# Patient Record
Sex: Female | Born: 1950 | Race: White | Hispanic: No | Marital: Married | Smoking: Former smoker
Health system: Southern US, Academic
[De-identification: ages and names within clinical notes are randomized; demographics above are authoritative.]

## PROBLEM LIST (undated history)

## (undated) DIAGNOSIS — M545 Low back pain, unspecified: Secondary | ICD-10-CM

## (undated) DIAGNOSIS — J45909 Unspecified asthma, uncomplicated: Secondary | ICD-10-CM

## (undated) DIAGNOSIS — I4891 Unspecified atrial fibrillation: Secondary | ICD-10-CM

## (undated) DIAGNOSIS — Z9289 Personal history of other medical treatment: Secondary | ICD-10-CM

## (undated) DIAGNOSIS — C189 Malignant neoplasm of colon, unspecified: Secondary | ICD-10-CM

## (undated) DIAGNOSIS — L02211 Cutaneous abscess of abdominal wall: Secondary | ICD-10-CM

## (undated) DIAGNOSIS — M1990 Unspecified osteoarthritis, unspecified site: Secondary | ICD-10-CM

## (undated) DIAGNOSIS — G4733 Obstructive sleep apnea (adult) (pediatric): Secondary | ICD-10-CM

## (undated) DIAGNOSIS — Z9884 Bariatric surgery status: Secondary | ICD-10-CM

## (undated) DIAGNOSIS — I10 Essential (primary) hypertension: Secondary | ICD-10-CM

## (undated) DIAGNOSIS — C539 Malignant neoplasm of cervix uteri, unspecified: Secondary | ICD-10-CM

## (undated) DIAGNOSIS — E785 Hyperlipidemia, unspecified: Secondary | ICD-10-CM

## (undated) DIAGNOSIS — K219 Gastro-esophageal reflux disease without esophagitis: Secondary | ICD-10-CM

## (undated) DIAGNOSIS — G8929 Other chronic pain: Secondary | ICD-10-CM

## (undated) DIAGNOSIS — J189 Pneumonia, unspecified organism: Secondary | ICD-10-CM

## (undated) DIAGNOSIS — G4700 Insomnia, unspecified: Secondary | ICD-10-CM

## (undated) DIAGNOSIS — Z9981 Dependence on supplemental oxygen: Secondary | ICD-10-CM

## (undated) DIAGNOSIS — L988 Other specified disorders of the skin and subcutaneous tissue: Secondary | ICD-10-CM

## (undated) DIAGNOSIS — J449 Chronic obstructive pulmonary disease, unspecified: Secondary | ICD-10-CM

## (undated) DIAGNOSIS — E669 Obesity, unspecified: Secondary | ICD-10-CM

## (undated) DIAGNOSIS — H269 Unspecified cataract: Secondary | ICD-10-CM

## (undated) DIAGNOSIS — M129 Arthropathy, unspecified: Secondary | ICD-10-CM

## (undated) DIAGNOSIS — C801 Malignant (primary) neoplasm, unspecified: Secondary | ICD-10-CM

## (undated) DIAGNOSIS — R918 Other nonspecific abnormal finding of lung field: Secondary | ICD-10-CM

## (undated) DIAGNOSIS — G4730 Sleep apnea, unspecified: Secondary | ICD-10-CM

## (undated) DIAGNOSIS — K439 Ventral hernia without obstruction or gangrene: Secondary | ICD-10-CM

## (undated) HISTORY — PX: HERNIA REPAIR: SHX51

## (undated) HISTORY — DX: Unspecified atrial fibrillation: I48.91

## (undated) HISTORY — PX: LAPAROSCOPIC GASTRIC BANDING: SHX1100

## (undated) HISTORY — DX: Morbid (severe) obesity due to excess calories: E66.01

## (undated) HISTORY — PX: ABDOMINAL HERNIA REPAIR: SHX539

## (undated) HISTORY — PX: DILATION AND CURETTAGE OF UTERUS: SHX78

## (undated) HISTORY — PX: COLON SURGERY: SHX602

## (undated) HISTORY — DX: Other specified disorders of the skin and subcutaneous tissue: L98.8

## (undated) HISTORY — DX: Chronic obstructive pulmonary disease, unspecified: J44.9

## (undated) HISTORY — DX: Ventral hernia without obstruction or gangrene: K43.9

## (undated) HISTORY — PX: ABSCESS DRAINAGE: SHX399

## (undated) HISTORY — PX: HX HYSTERECTOMY: SHX81

## (undated) HISTORY — PX: HX LAPAROTOMY: SHX154

## (undated) HISTORY — PX: CATARACT EXTRACTION: SUR2

## (undated) HISTORY — PX: HX GASTRIC BYPASS: SHX52

## (undated) HISTORY — PX: HX HERNIA REPAIR: SHX51

## (undated) HISTORY — PX: HX CERVICAL CONE BIOPSY: 2100001333

## (undated) HISTORY — DX: Chronic obstructive pulmonary disease, unspecified (CMS HCC): J44.9

## (undated) HISTORY — PX: BOWEL RESECTION: SHX1257

## (undated) HISTORY — PX: HX LAP BANDING: 2100001104

---
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
## 1970-08-20 DIAGNOSIS — C539 Malignant neoplasm of cervix uteri, unspecified: Secondary | ICD-10-CM

## 1970-08-20 HISTORY — DX: Malignant neoplasm of cervix uteri, unspecified: C53.9

## 1990-08-20 HISTORY — PX: GASTRIC BYPASS: SHX52

## 1996-08-20 HISTORY — PX: TOTAL ABDOMINAL HYSTERECTOMY: SHX209

## 2006-09-13 ENCOUNTER — Other Ambulatory Visit: Payer: Self-pay

## 2008-04-27 ENCOUNTER — Other Ambulatory Visit (HOSPITAL_COMMUNITY): Payer: Self-pay | Admitting: Family Medicine

## 2008-06-10 ENCOUNTER — Ambulatory Visit
Admission: RE | Admit: 2008-06-10 | Discharge: 2008-06-10 | Disposition: A | Discharge status: Home / Self Care | Payer: Self-pay | Attending: Family Medicine | Admitting: Family Medicine

## 2008-09-15 ENCOUNTER — Ambulatory Visit (HOSPITAL_COMMUNITY): Payer: Self-pay

## 2008-11-18 ENCOUNTER — Emergency Department (EMERGENCY_DEPARTMENT_HOSPITAL)
Admission: EM | Admit: 2008-11-18 | Discharge: 2008-11-19 | Disposition: A | Discharge status: Home / Self Care | Payer: BC Managed Care – PPO

## 2008-11-18 ENCOUNTER — Encounter (EMERGENCY_DEPARTMENT_HOSPITAL): Payer: BC Managed Care – PPO

## 2009-05-11 ENCOUNTER — Ambulatory Visit (HOSPITAL_COMMUNITY): Payer: Self-pay

## 2012-04-05 ENCOUNTER — Inpatient Hospital Stay (HOSPITAL_COMMUNITY): Admit: 2012-04-05 | Payer: Self-pay | Admitting: General Surgery-BA

## 2012-04-11 ENCOUNTER — Emergency Department (EMERGENCY_DEPARTMENT_HOSPITAL): Payer: Medicare Other

## 2012-04-11 ENCOUNTER — Encounter (HOSPITAL_COMMUNITY): Payer: Self-pay

## 2012-04-11 ENCOUNTER — Emergency Department
Admission: EM | Admit: 2012-04-11 | Discharge: 2012-04-11 | Disposition: A | Discharge status: Home / Self Care | Payer: Medicare Other | Attending: Emergency Medicine | Admitting: Emergency Medicine

## 2012-04-11 DIAGNOSIS — Z87891 Personal history of nicotine dependence: Secondary | ICD-10-CM | POA: Insufficient documentation

## 2012-04-11 DIAGNOSIS — T8131XA Disruption of external operation (surgical) wound, not elsewhere classified, initial encounter: Secondary | ICD-10-CM | POA: Insufficient documentation

## 2012-04-11 DIAGNOSIS — Z9884 Bariatric surgery status: Secondary | ICD-10-CM | POA: Insufficient documentation

## 2012-04-11 DIAGNOSIS — R112 Nausea with vomiting, unspecified: Secondary | ICD-10-CM | POA: Insufficient documentation

## 2012-04-11 HISTORY — DX: Obesity, unspecified: E66.9

## 2012-04-11 HISTORY — DX: Essential (primary) hypertension: I10

## 2012-04-11 HISTORY — DX: Sleep apnea, unspecified: G47.30

## 2012-04-11 HISTORY — DX: Hyperlipidemia, unspecified: E78.5

## 2012-04-11 HISTORY — DX: Malignant (primary) neoplasm, unspecified: C80.1

## 2012-04-11 HISTORY — DX: Malignant (primary) neoplasm, unspecified (CMS HCC): C80.1

## 2012-04-11 HISTORY — DX: Arthropathy, unspecified: M12.9

## 2012-04-11 LAB — BASIC METABOLIC PANEL
ANION GAP: 9 mmol/L (ref 5–16)
CALCIUM: 8.3 mg/dL — ABNORMAL LOW (ref 8.5–10.4)
CARBON DIOXIDE: 25 mmol/L (ref 22–32)
CHLORIDE: 104 mmol/L (ref 96–111)
CREATININE: 1.49 mg/dL — ABNORMAL HIGH (ref 0.49–1.10)
ESTIMATED GLOMERULAR FILTRATION RATE: 36 mL/min/{1.73_m2} — ABNORMAL LOW (ref >59)
POTASSIUM: 3.6 mmol/L (ref 3.5–5.1)
SODIUM: 138 mmol/L (ref 136–145)

## 2012-04-11 LAB — CBC/DIFF
BASOPHILS: 1 %
BASOS ABS: 0.065 10*3/uL (ref 0.0–0.2)
EOS ABS: 0.094 10*3/uL (ref 0.0–0.5)
HCT: 30.4 % — ABNORMAL LOW (ref 33.5–45.2)
HGB: 9.4 g/dL — ABNORMAL LOW (ref 11.2–15.2)
LYMPHOCYTES: 12 %
LYMPHS ABS: 1.451 10*3/uL — ABNORMAL LOW (ref 1.5–3.5)
MCH: 23.9 pg — ABNORMAL LOW (ref 27.4–33.0)
MCHC: 30.9 g/dL — ABNORMAL LOW (ref 32.5–35.8)
MCV: 77.6 fL — ABNORMAL LOW (ref 78–100)
MONOCYTES: 10 %
MONOS ABS: 1.223 10*3/uL — ABNORMAL HIGH (ref 0.3–1.0)
MPV: 7.5 fL (ref 7.5–11.5)
PLATELET COUNT: 390 THOU/uL (ref 140–450)
PMN ABS: 9.092 10*3/uL — ABNORMAL HIGH (ref 2.0–6.5)
RBC: 3.92 MIL/uL (ref 3.63–4.92)
RDW: 16.8 % — ABNORMAL HIGH (ref 12.0–15.0)
WBC: 11.9 THOU/uL — ABNORMAL HIGH (ref 3.5–11.0)

## 2012-04-11 LAB — SEDIMENTATION RATE: SEDIMENTATION RATE: 99 mm/hr — ABNORMAL HIGH (ref 0–30)

## 2012-04-11 LAB — C-REACTIVE PROTEIN(CRP),INFLAMMATION: C-REACTIVE PROTEIN (CRP),INFLAMMATION: 7.44 mg/dL — ABNORMAL HIGH (ref <0.800)

## 2012-04-11 MED ORDER — ONDANSETRON HCL (PF) 4 MG/2 ML INJECTION SOLUTION
4.00 mg | INTRAMUSCULAR | Status: AC
Start: 2012-04-11 — End: 2012-04-11

## 2012-04-11 MED ORDER — MORPHINE 4 MG/ML INJECTION SYRINGE
4.00 mg | INJECTION | INTRAMUSCULAR | Status: AC
Start: 2012-04-11 — End: 2012-04-11

## 2012-04-11 MED ORDER — MORPHINE 4 MG/ML INJECTION SYRINGE
INJECTION | INTRAMUSCULAR | Status: AC
Start: 2012-04-11 — End: 2012-04-11
  Filled 2012-04-11: qty 1

## 2012-04-11 MED ORDER — ONDANSETRON HCL (PF) 4 MG/2 ML INJECTION SOLUTION
INTRAMUSCULAR | Status: AC
Start: 2012-04-11 — End: 2012-04-11
  Filled 2012-04-11: qty 2

## 2012-04-11 NOTE — ED Resident Handoff Note (Signed)
Code Status: Full    Allergies   Allergen Reactions   . Shellfish Containing Products Shortness of Breath, Rash and Itching       Filed Vitals:    04/11/12 1155 04/11/12 1400 04/11/12 1429   BP: 124/84 166/67 130/56   Pulse: 77 75 73   Temp: 36.9 C (98.4 F)     Resp: 18  18   SpO2: 96%  97%     HPI:  Kelsey [name] is a 61 y.o. female with PMH of lap band surgery presents with abdominal wound infection, n/v/fevers and a tube projecting from abdomen.      Pertinent Exam Findings:  2-cm open wound to the right and above the umbilicus, 3-4 cm white tubing sticking through abdominal wall, positive yellowish drainage    Pertinent Imaging/Lab results:  Results for orders placed during the hospital encounter of 04/11/12 (from the past 12 hour(s))   CBC/DIFF       Component Value Range    WBC 11.9 (*) 3.5 - 11.0 THOU/uL    RBC 3.92  3.63 - 4.92 MIL/uL    HGB 9.4 (*) 11.2 - 15.2 g/dL    HCT 62.9 (*) 52.8 - 45.2 %    MCV 77.6 (*) 78 - 100 fL    MCH 23.9 (*) 27.4 - 33.0 pg    MCHC 30.9 (*) 32.5 - 35.8 g/dL    RDW 41.3 (*) 24.4 - 15.0 %    PLATELET COUNT 390  140 - 450 THOU/uL    MPV 7.5  7.5 - 11.5 fL    PMN'S 76      PMN ABS 9.092 (*) 2.0 - 6.5 THOU/uL    LYMPHOCYTES 12      LYMPHS ABS 1.451 (*) 1.5 - 3.5 THOU/uL    MONOCYTES 10      MONOS ABS 1.223 (*) 0.3 - 1.0 THOU/uL    EOSINOPHIL 1      EOS ABS 0.094  0.0 - 0.5 THOU/uL    BASOPHILS 1      BASOS ABS 0.065  0.0 - 0.2 THOU/uL   BASIC METABOLIC PANEL, NON-FASTING       Component Value Range    SODIUM 138  136 - 145 mmol/L    POTASSIUM 3.6  3.5 - 5.1 mmol/L    CHLORIDE 104  96 - 111 mmol/L    CARBON DIOXIDE 25  22 - 32 mmol/L    ANION GAP 9  5 - 16 mmol/L    CREATININE 1.49 (*) 0.49 - 1.10 mg/dL    ESTIMATED GLOMERULAR FILTRATION RATE 36 (*) >59 ml/min/1.68m2    GLUCOSE,NONFAST 85  65 - 139 mg/dL    BUN 12  6 - 20 mg/dL    BUN/CREAT RATIO 8  6 - 22    CALCIUM 8.3 (*) 8.5 - 10.4 mg/dL   SEDIMENTATION RATE       Component Value Range    SEDIMENTATION RATE 99 (*) 0 - 30 mm/hr     C-REACTIVE PROTEIN(CRP),INFLAMMATION       Component Value Range    C-REACTIVE PROTEIN HIGH SENSITIVITY (INFLAMMATION) 7.440 (*) <0.800 mg/dL     Pending Studies:  Blood cultures, wound culture    Consults:  General surgery (not yet consulted)    Plan:  Once [facility] faxes over records, will call general surgery to evaluate patient.  After a thorough discussion of the patient including presentation, ED course, and review of above information I have assumed care of Kelsey [name]  from Dr. Hadley Pen at 4:52 PM    Donnelly Stager, MD 04/11/2012, 4:52 PM    Course:  1730: surgery paged  1815: surgery evaluated patient, awaiting recommendation  2130: surgery recommends home discharge.  They trimmed the tubing some more, applied a wet-to-dry dressing, advised to keep abdominal wound site clean, to continue taking home antibiotics of Avelox and Augmentin, and keep scheduled follow-up appointment with Dr. Andrey Farmer and that they could obtain a referral to other bariatric surgeons, as Kelsey [name] is not pleased with the care she has received with Dr. Andrey Farmer.  2200: Patient discharged to home with the above instructions.

## 2012-04-11 NOTE — Discharge Instructions (Signed)
Please continue taking home antibiotics of Augmentin and Avalox for prevention and/or treatment of infection.  Please follow-up with Dr. Andrey Farmer for further plan in regards to your lap band surgery.  Please follow-up with PCP Dr. Kennedy Bucker in a couple weeks.  Please keep a wet-to-dry dressing on your abdominal wound.  Please return to ED if symptoms such as fever, chills, and worsening of wound appears.  Wound Dehiscence   Wound dehiscence is when a surgical cut (incision) opens up. It usually happens 7 to 10 days after surgery. You may have pain, a fever, or have more fluid coming from the cut. It should be treated early.   HOME CARE   Only take medicines as told by your doctor.   Take your medicines (antibiotics) as told. Finish them even if you start to feel better.   Wash your wound with warm, soapy water 2 times a day, or as told. Pat the wound dry. Do not rub the wound.   Change bandages (dressings) as often as told. Wash your hands before and after changing bandages. Apply bandages as told.   Take showers. Do not soak the wound, bathe, or swim until your wound is healed.   Avoid exercises that make you sweat.   Use medicines that stop itching as told by your doctor. The wound may itch as it heals. Do not pick or scratch at the wound.   Do not lift more than 10 pounds (4.5 kilograms) until the wound is healed, or as told by your doctor.   Keep all doctor visits as told.   GET HELP RIGHT AWAY IF:   You have more puffiness (swelling) or redness around the wound.   You have more pain in the wound.   You have yellowish white fluid (pus) coming from the wound.   More of the wound breaks open.   You have a fever.   MAKE SURE YOU:   Understand these instructions.   Will watch your condition.   Will get help right away if you are not doing well or get worse.       Diet Following Bariatric Surgery   The bariatric diet is designed to provide fluids and nourishment while promoting weight loss after bariatric surgery. The diet  is divided into 3 stages. The rate of progression varies based on individual food tolerance.   DIET FOLLOWING BARIATRIC SURGERY   The diet following surgery is divided into 3 stages to allow a gradual adjustment. It is very important to the success of your surgery to:   Progress to each stage slowly.   Eat at set times.   Chew food well and stop eating when you are full.   Not drink liquids 30 minutes before and after meals.   If you feel tightness or pressure in your chest, that means you are full. Wait 30 minutes before you try to eat again.   STAGE 1 BARIATRIC DIET - ABOUT 2 WEEKS IN DURATION   The diet begins the day of surgery. It will last about 1 to 2 weeks after surgery. Your surgeon may have individual guidelines for you about specific foods or the progression of your diet. Follow your surgeon's guidelines.   If clear liquids are well-tolerated without vomiting, your caregiver will add a 4 oz to 6 oz high protein, low-calorie liquid supplement. You could add this to your meal plan 3 times daily. You will need at least 60 g to 80 g of protein daily or as determined  by your Registered Dietitian.   Guidelines for choosing a protein supplement include:   At least 15 g of protein per 8 oz serving.   Less than 20 g total carbohydrate per 8 oz serving.   Less than 5 g fat per 8 oz serving.   Avoid carbonated beverages, caffeine, alcohol, and concentrated sweets such as sugar, cakes, and cookies.   Right after surgery, you may only be able to eat 3 to 4 tsp per meal. Your maximum volume should not exceed  to  cup total. Do not eat or drink more than 1 oz or 2 tbs every 15 minutes.   Take a chewable multivitamin and mineral supplement.   Drink at least 48 oz of fluid daily, which includes your protein supplement.   Food and beverages from the list below are allowed at set times (for example at 8 AM, 12 noon, or 5 PM):   Decaffeinated coffee or tea.   100% fruit juice.   Diet or sugar-free drinks.   Broth.    Blenderized soup.   Skim milk or lactose-free milk.   Sugar-free gelatin dessert or frozen ice pops.   Mashed potatoes.   Yogurt (artificially sweetened).   Sugar-free pudding.   Blended low-fat cottage cheese.   Unsweetened applesauce, grits, or hot wheat cereal.   Four to six ounces of a liquid protein supplement from the list below is recommended for snacks at 10 AM, 2 PM, and 8 PM.   STAGE 2 BARIATRIC DIET (SOFT DIET) - ABOUT 4 WEEKS IN DURATION   About 2 weeks after surgery, your caregiver will progress your diet to this stage. Foods may need to be blended to the consistency of applesauce. Choose low-fat foods (less than 5 g of fat per serving) and avoid concentrated sweets and sugar (less than 10 g of sugar per serving). Meals should not exceed  to  cup total. This stage will last about 4 weeks. It is recommended that you meet with your dietitian at this stage to begin preparation for the last stage.   This stage consists of 3 meals a day with a liquid protein supplement between meals twice daily. Do not drink liquids with foods. You must wait 30 minutes for the stomach pouch to empty before drinking. Chew food well. The food must be almost liquified before swallowing.   Soft foods from the list below can now be slowly added to your diet:   Soft fruit (soft canned fruit in light syrup or natural juice, banana, melon, peaches, pears, or strawberries).   Cooked vegetables.   Toast or crackers (becomes soft after chewing 20 times).   Hot wheat cereal.   Fish.   Eggs (scrambled, soft-boiled).   STAGE 3 BARIATRIC DIET (REGULAR DIET) - ABOUT 6 to 8 WEEKS AFTER SURGERY   About 6 to 8 weeks after surgery, you will be advanced to food that is regular in texture. This diet should include all food groups. The diet will continue to promote weight loss. Meals should not exceed  to 1 cup total. Your dietitian will be available to assist you in meal planning and additional behavioral strategies to make this final stage  a long-term success.   Slowly add foods of regular consistency and remember:   Eat only at your chosen meal times.   Minimize drinking with meals. You should drink 30 minutes before eating. Do not start drinking again for about 2 hours after eating.   Chew food well. Take small bites.  Think about the portion size of a healthy frozen meal. You will be able to eat most of this.   Make sure your meal is balanced with starch, protein, fruits, and vegetables.   When you feel full, stop eating.

## 2012-04-11 NOTE — ED Attending Note (Signed)
 Attending Note:  I have seen and examined the patient and discussed care with the resident physician,   In brief, the patient is a 61 yo who presents with plastic tubing that started coming out of her abdominal wound yesterday.  Recently discharged from [facility].  Will obtain xray abdomen, labs and consult surgery.

## 2012-04-11 NOTE — ED Nurses Note (Signed)
 Waiting for gen. surg. to come and evaluate pt.

## 2012-04-11 NOTE — ED Nurses Note (Signed)
IV lock removed intact.  Bandaged with 2x2 and tape.  No bleeding noted.  Discharged as written, no questions or concerns.  Ambulatory from department under own power.

## 2012-04-11 NOTE — ED Provider Notes (Signed)
 HPI  History provided by patient    Chief complaint: Post-Op Problem      Kelsey [name] is a 61 y.o. female presenting with complaint of abdominal infection and tube hanging out of abdominal wound.  Pt states she underwent lap band procedure on 03/04/12 at [facility].  Afterwards, her course was complicated by infection/fever/N/V.  She was hospitalized at [facility] and eventually discharged home on antibiotic therapy.  She did not improve, however, and eventually was admitted to [facility] this past Sunday.  She states that she wanted to have her lap band taken out because of the trouble it had caused her.  She apparently did undergo an operation earlier this week to remove the port and tubing, but she believes her surgeon (Dr. Dalton) simply cut the port off and left the tubing inside of her.  She now has an open wound in her abdominal wall, which has began to extrude a piece of white tubing.  She states that she noticed the tubing this morning while showering, and states it was not there previously.  Currently she has nausea, chills, fever, abdominal pain.  She denies CP, SOB, HA or dizziness.      Review of Systems  Constitutional: + fever, chills  Skin: No rashes, + wound in anterior abdominal wall  HEENT: No headache. No change in hearing  Eyes: No vision changes.   Cardiovascular: No chest pain, palpitations or leg swelling   Respiratory: No cough or dyspnea  Gastrointestinal:  + abd pain, + N/V  Genitourinary:  No dysuria or frequency  Musculoskeletal: No weakness or extremity pain  Neurological:   No numbness or tingling   All other systems reviewed and are negative.    History   Past Medical History   Diagnosis Date   . HTN (hypertension)    . Hyperlipidemia    . Obesity    . Arthropathy, unspecified, site unspecified    . Asthma    . Sleep apnea    . Cancer      cervical     Past Surgical History   Procedure Date   . Hx lap banding    . Hx cervical cone biopsy     . Hx gastric bypass    . Hx hysterectomy           Medications Prior to Admission     Medication    METOPROLOL  SUCCINATE ORAL    Take by mouth    atorvastatin  (LIPITOR) 10 mg Oral Tablet    Take 10 mg by mouth Once a day    moxifloxacin  (AVELOX ) 400 mg Oral Tablet    Take 400 mg by mouth Once a day    oxyCODONE  (OXY IR) 5 mg Oral Capsule capsule    Take 5 mg by mouth Every 6 hours as needed        Allergies   Allergen Reactions   . Shellfish Containing Products Shortness of Breath, Rash and Itching     Social Hx:   History   Substance Use Topics   . Smoking status: Former Games developer   . Smokeless tobacco: Not on file    Comment: quit 4 yrs ago   . Alcohol Use: Yes      rarely       No family history on file.  Dorothyann Ferretti, MD    Above history reviewed with patient, changes are as documented    Physical Exam   Nursing notes reviewed.    ED Triage Vitals  Enc Vitals Group      BP (Non-Invasive) 04/11/12 1155 124/84 mmHg      Heart Rate 04/11/12 1155 77       Respiratory Rate 04/11/12 1155 18       Temperature 04/11/12 1155 36.9 C (98.4 F)      Temp src --       SpO2-1 04/11/12 1155 96 %      Weight 04/11/12 1155 139.708 kg (308 lb)      Height 04/11/12 1155 1.575 m (5' 2)      Head Cir --       Peak Flow --       Pain Score --       Pain Loc --       Pain Edu? --       Excl. in GC? --      Constitutional: NAD. Alert and oriented.  HENT:   Head: Normocephalic and atraumatic.   Mouth/Throat: Mucous membranes are moist.   Eyes:  Conjunctivae normal  Neck: Supple. Normal ROM   Cardiovascular: RRR. No murmurs, rubs or gallops.   Pulmonary/Chest: No respiratory distress. Breath sounds equal bilaterally. No wheezes, rales or rhonchi.    Abdominal:  Abdomen soft.  ~ 2 cm open wound in R anterior abdominal, draining yellowish substance.  ~ 3 cm small diameter white tubing is projecting from open wound.    Musculoskeletal: Normal ROM, No edema, tenderness or gross deformity.  Skin: Warm and dry. No rash, erythema, pallor, cyanosis or jaundice.   Neurological: Alert and  oriented. Grossly intact.  Psychiatric: Normal mood and affect. Behavior is normal. Judgment and thought content normal.    Course  Orders Placed This Encounter   . ADULT ROUTINE BLOOD CULTURE, SET OF 2 BOTTLES (BACTERIA AND YEAST)   . ADULT ROUTINE BLOOD CULTURE, SET OF 2 BOTTLES (BACTERIA AND YEAST)   . WOUND, SUPERFICIAL/NON-STERILE SITE, CULTURE AND GRAM STAIN   . XR ABD FLAT AND UPRIGHT SERIES   . CBC/DIFF   . BASIC METABOLIC PANEL, NON-FASTING   . SEDIMENTATION RATE   . C-REACTIVE PROTEIN(CRP),INFLAMMATION   . ondansetron  (ZOFRAN ) 2 mg/mL injection   . morphine  4 mg/mL injection   . ondansetron  (ZOFRAN ) injection ---Madalyn Pickup   . morphine  injection ---Cabinet Override       Abnormal labs during ED stay are noted below.  All other labs were within normal limits.   Labs Reviewed   CBC/DIFF - Abnormal; Notable for the following:     WBC 11.9 (*)      HGB 9.4 (*)      HCT 30.4 (*)      MCV 77.6 (*)      MCH 23.9 (*)      MCHC 30.9 (*)      RDW 16.8 (*)      PMN ABS 9.092 (*)      LYMPHS ABS 1.451 (*)      MONOS ABS 1.223 (*)      All other components within normal limits   BASIC METABOLIC PANEL, NON-FASTING - Abnormal; Notable for the following:     CREATININE 1.49 (*)      ESTIMATED GLOMERULAR FILTRATION RATE 36 (*)      CALCIUM  8.3 (*)      All other components within normal limits   SEDIMENTATION RATE - Abnormal; Notable for the following:     SEDIMENTATION RATE 99 (*)      All other components within normal limits  C-REACTIVE PROTEIN(CRP),INFLAMMATION - Abnormal; Notable for the following:     C-REACTIVE PROTEIN HIGH SENSITIVITY (INFLAMMATION) 7.440 (*)      All other components within normal limits       Imaging Studies:  XR ABD FLAT AND UPRIGHT SERIES    Final Result:  No evidence of any acute thoracic or abdominal abnormality.                ECG: None    All labs, imaging and/or studies reviewed.    Medical records reviewed.     Medical Decision Making  Garyn Waguespack is a 61 y.o. female presenting  with open abdominal wound with extrusion of tubing.     Vitals: stable    We will check basic labs, ESR, CRP, Abd XR, wound culture.    We will give Zofran  and Morphine .    We will obtain records from [facility] and [place]; possible consult to general surgery afterwards.      Surgery was consulted for evaluation of patient    Care handed off to Dr. Delois.    Consults  None yet    Impression    Encounter Diagnoses   Name Primary?   SABRA Hx of laparoscopic gastric banding Yes   . Abdominal wound dehiscence             HPI  Review of Systems  Physical Exam  Course

## 2012-04-11 NOTE — ED Attending Handoff Note (Signed)
Care of patient assumed from Dr. Alla German at [phone] with plastic tube protruding from an abdominal wound.  Labs and imaging pending prior to disposition.    Larey Seat, MD 04/11/2012, 3:03 PM    Results for orders placed during the hospital encounter of 04/11/12 (from the past 12 hour(s))   CBC/DIFF       Component Value Range    WBC 11.9 (*) 3.5 - 11.0 THOU/uL    RBC 3.92  3.63 - 4.92 MIL/uL    HGB 9.4 (*) 11.2 - 15.2 g/dL    HCT 54.0 (*) 98.1 - 45.2 %    MCV 77.6 (*) 78 - 100 fL    MCH 23.9 (*) 27.4 - 33.0 pg    MCHC 30.9 (*) 32.5 - 35.8 g/dL    RDW 19.1 (*) 47.8 - 15.0 %    PLATELET COUNT 390  140 - 450 THOU/uL    MPV 7.5  7.5 - 11.5 fL    PMN'S 76      PMN ABS 9.092 (*) 2.0 - 6.5 THOU/uL    LYMPHOCYTES 12      LYMPHS ABS 1.451 (*) 1.5 - 3.5 THOU/uL    MONOCYTES 10      MONOS ABS 1.223 (*) 0.3 - 1.0 THOU/uL    EOSINOPHIL 1      EOS ABS 0.094  0.0 - 0.5 THOU/uL    BASOPHILS 1      BASOS ABS 0.065  0.0 - 0.2 THOU/uL   BASIC METABOLIC PANEL, NON-FASTING       Component Value Range    SODIUM 138  136 - 145 mmol/L    POTASSIUM 3.6  3.5 - 5.1 mmol/L    CHLORIDE 104  96 - 111 mmol/L    CARBON DIOXIDE 25  22 - 32 mmol/L    ANION GAP 9  5 - 16 mmol/L    CREATININE 1.49 (*) 0.49 - 1.10 mg/dL    ESTIMATED GLOMERULAR FILTRATION RATE 36 (*) >59 ml/min/1.42m2    GLUCOSE,NONFAST 85  65 - 139 mg/dL    BUN 12  6 - 20 mg/dL    BUN/CREAT RATIO 8  6 - 22    CALCIUM 8.3 (*) 8.5 - 10.4 mg/dL   SEDIMENTATION RATE       Component Value Range    SEDIMENTATION RATE 99 (*) 0 - 30 mm/hr   C-REACTIVE PROTEIN(CRP),INFLAMMATION       Component Value Range    C-REACTIVE PROTEIN HIGH SENSITIVITY (INFLAMMATION) 7.440 (*) <0.800 mg/dL       AAS- No evidence of any acute thoracic or abdominal abnormality.    Seen and evaluated by the general surgery service.      The patient will follow-up with her original surgeon.  Continue antibiotics as previously directed. Return to the emergency department for any new or worsening symptoms.

## 2012-04-11 NOTE — H&P (Addendum)
[facility]  [facility]  Admission H&P    Fuertes,Kelsey [name], 61 y.o. female  Date of Birth:  [birth year]  Date of Admission:  04/11/2012    Information Obtained from: patient  Chief Complaint: Abdominal wound infection    PCP: Providence Crosby, MD     HPI: (must include no less than 4 of the following main descriptors) Location (of pain): Quality (character of pain) Severity (minimal, mild, severe, scale or 1-10) Duration (how long has pain/sx present) Timing (when does pain/sx occur)  Context (activity at/before onset) Modifying Factors (what makes pain/sx  Better/worse) Associate Sign/Sx (what accompanies main pain/sx)     Kelsey [name] is a 61 y.o., White female who presents with abdominal wound infection. Pt has complicated bariatric h/o including gastric bypass 17 years ago and lap band 1 month ago at [facility] with Dr. Andrey Farmer. She had a wound infection following the lap band and was seen at another hospital that put her on avalox x 10 days. She then went back to see Dr. Andrey Farmer where he I&D the wound and removed the lap band port, but not the tubing, and placed her on Augmentin. She was discharged from [facility] 1 day ago. Yesterday she noticed the tubing popping out of her skin with yellow discharge coming from the wound. Appearance of wound is much improved from prior to I&D per pt. She has had fever to 100.8 at home, denies chills, and has had n/v off and on since the lap band placement.       Pre-operative Risk Assessment   Not a pre-operative H&P    ROS:  MUST comment on all "Abnormal" findings   ROS Other than ROS in the HPI, all other systems were negative.    PAST MEDICAL/ FAMILY/ SOCIAL HISTORY:       Past Medical History   Diagnosis Date   . HTN (hypertension)    . Hyperlipidemia    . Obesity    . Arthropathy, unspecified, site unspecified    . Asthma    . Sleep apnea    . Cancer      cervical     Allergies   Allergen Reactions    . Shellfish Containing Products Shortness of Breath, Rash and Itching     Medications Prior to Admission     Medication    METOPROLOL SUCCINATE ORAL    Take by mouth    atorvastatin (LIPITOR) 10 mg Oral Tablet    Take 10 mg by mouth Once a day    moxifloxacin (AVELOX) 400 mg Oral Tablet    Take 400 mg by mouth Once a day    oxyCODONE (OXY IR) 5 mg Oral Capsule capsule    Take 5 mg by mouth Every 6 hours as needed         Past Surgical History   Procedure Date   . Hx lap banding    . Hx cervical cone biopsy     . Hx gastric bypass    . Hx hysterectomy      No family history on file.  History   Substance Use Topics   . Smoking status: Former Games developer   . Smokeless tobacco: Not on file    Comment: quit 4 yrs ago   . Alcohol Use: Yes      rarely         PHYSICAL EXAMINATION: MUST comment on all "Abnormal" findings    Exam Temperature: 36.9 C (98.4 F)  Heart Rate: 80  BP (Non-Invasive): 152/67 mmHg  Respiratory Rate: 18   SpO2-1: 95 %  Pain Score (Numeric, Faces): 8  General: appears in good health and morbidly obese  Lungs: Clear to auscultation bilaterally.   Cardiovascular: regular rate and rhythm  Abdomen: Soft, non-tender, Bowel sounds normal, non-distended, 3cm abdominal wound with surrounding erythema, and yellow drainage, and tube protruding ~4cm.      Labs Ordered/ Reviewed (Please indicate ordered or reviewed)   Reviewed: Labs:  Lab Results for Last 24 Hours:    Results for orders placed during the hospital encounter of 04/11/12 (from the past 24 hour(s))   ADULT ROUTINE BLOOD CULTURE, SET OF 2 BOTTLES (BACTERIA AND YEAST)       Component Value Range    SPECIMEN DESCRIPTION        Value: BLOOD      LEFT      ANTECUBITAL    SPECIAL REQUESTS        Value: 1 BacT/ALERT FA and 1 BacT/ALERT SN bottle received    POSITIVE CULTURE NOT REPORTED      CULTURE OBSERVATION NO GROWTH <24 HRS      REPORT STATUS PENDING     CBC/DIFF       Component Value Range    WBC 11.9 (*) 3.5 - 11.0 THOU/uL     RBC 3.92  3.63 - 4.92 MIL/uL    HGB 9.4 (*) 11.2 - 15.2 g/dL    HCT 16.1 (*) 09.6 - 45.2 %    MCV 77.6 (*) 78 - 100 fL    MCH 23.9 (*) 27.4 - 33.0 pg    MCHC 30.9 (*) 32.5 - 35.8 g/dL    RDW 04.5 (*) 40.9 - 15.0 %    PLATELET COUNT 390  140 - 450 THOU/uL    MPV 7.5  7.5 - 11.5 fL    PMN'S 76      PMN ABS 9.092 (*) 2.0 - 6.5 THOU/uL    LYMPHOCYTES 12      LYMPHS ABS 1.451 (*) 1.5 - 3.5 THOU/uL    MONOCYTES 10      MONOS ABS 1.223 (*) 0.3 - 1.0 THOU/uL    EOSINOPHIL 1      EOS ABS 0.094  0.0 - 0.5 THOU/uL    BASOPHILS 1      BASOS ABS 0.065  0.0 - 0.2 THOU/uL   BASIC METABOLIC PANEL, NON-FASTING       Component Value Range    SODIUM 138  136 - 145 mmol/L    POTASSIUM 3.6  3.5 - 5.1 mmol/L    CHLORIDE 104  96 - 111 mmol/L    CARBON DIOXIDE 25  22 - 32 mmol/L    ANION GAP 9  5 - 16 mmol/L    CREATININE 1.49 (*) 0.49 - 1.10 mg/dL    ESTIMATED GLOMERULAR FILTRATION RATE 36 (*) >59 ml/min/1.58m2    GLUCOSE,NONFAST 85  65 - 139 mg/dL    BUN 12  6 - 20 mg/dL    BUN/CREAT RATIO 8  6 - 22    CALCIUM 8.3 (*) 8.5 - 10.4 mg/dL   SEDIMENTATION RATE       Component Value Range    SEDIMENTATION RATE 99 (*) 0 - 30 mm/hr   C-REACTIVE PROTEIN(CRP),INFLAMMATION       Component Value Range    C-REACTIVE PROTEIN HIGH SENSITIVITY (INFLAMMATION) 7.440 (*) <0.800 mg/dL   WOUND, SUPERFICIAL/NON-STERILE SITE, CULTURE AND GRAM STAIN       Component Value  Range    SPECIMEN DESCRIPTION        Value: WOUND      ABDOMINAL WALL    SPECIAL REQUESTS NONE      GRAM STAIN   (*)     Value: SEVERAL      PMN'S SEEN      RARE      WBC'S SEEN      RARE      SQUAMOUS EPITHELIAL CELLS SEEN      VERY RARE      BUDDING YEAST    CULTURE OBSERVATION PENDING      REPORT STATUS PENDING           Radiology Tests Ordered/ Reviewed (Please indicate ordered or reviewed)       ASSESSMENT & PLAN:    There are no hospital problems to display for this patient.     61 yo F s/p lap band surgery at [facility] with wound infection. No significant white count, and infection improving.   -Wound irrigated with sterile water, and dressed with wet-to-dry and tegaderm  -No need for admission or urgent surgical intervention.  -Instructed to continue home avalox and augmentin regimen  -Instructed to f/u with Dr. Andrey Farmer (already has appt) to discuss further action     Suan Halter, MD, 04/11/2012 7:31 PM

## 2012-04-13 LAB — WOUND, SUPERFICIAL/NON-STERILE SITE, AEROBIC CULTURE AND GRAM STAIN

## 2012-04-13 NOTE — ED Resident Handoff Note (Signed)
Kelsey [name]  04/13/2012    Notified by micro lab of patient's positive blood culture from culture drawn 04/11/12.  Aerobic culture bottle positive for gram positive rods.    Left message on patient's cell phone [phone]) requesting patient return call to the ED.    Called home phone [phone]) and spoke with patient.  Patient states she is feeling better.  Stated she is currently on Avalox for anti-biotic coverage which should provide GPR coverage.    Instructed patient to follow up with PCP in next 2-3 days.  Instructed her to return to ED immediately if she develops fevers, vomiting or worsening abdominal pain.  She voiced understanding.    Chad Cordial, MD 04/13/2012, 4:32 PM

## 2012-04-17 LAB — ADULT ROUTINE BLOOD CULTURE, SET OF 2 BOTTLES (BACTERIA AND YEAST): SPECIAL REQUESTS: 1

## 2012-08-29 ENCOUNTER — Encounter (INDEPENDENT_AMBULATORY_CARE_PROVIDER_SITE_OTHER): Payer: Self-pay | Admitting: WVUPC-ELECTROPHYSIOLOGY

## 2012-09-25 ENCOUNTER — Encounter (INDEPENDENT_AMBULATORY_CARE_PROVIDER_SITE_OTHER): Payer: Self-pay

## 2013-08-20 DIAGNOSIS — C189 Malignant neoplasm of colon, unspecified: Secondary | ICD-10-CM

## 2013-08-20 HISTORY — DX: Malignant neoplasm of colon, unspecified: C18.9

## 2013-08-20 HISTORY — PX: BOWEL RESECTION: SHX1257

## 2013-11-30 ENCOUNTER — Ambulatory Visit (INDEPENDENT_AMBULATORY_CARE_PROVIDER_SITE_OTHER): Payer: Self-pay | Admitting: UHC-UROLOGY

## 2014-01-26 ENCOUNTER — Ambulatory Visit: Payer: Medicare Other | Attending: Surgery

## 2014-01-26 ENCOUNTER — Encounter (INDEPENDENT_AMBULATORY_CARE_PROVIDER_SITE_OTHER): Payer: Self-pay

## 2014-01-26 ENCOUNTER — Ambulatory Visit (INDEPENDENT_AMBULATORY_CARE_PROVIDER_SITE_OTHER): Payer: Medicare Other

## 2014-01-26 VITALS — BP 130/80 | HR 67 | Temp 97.3°F | Ht 63.0 in | Wt 321.2 lb

## 2014-01-26 DIAGNOSIS — Z6841 Body Mass Index (BMI) 40.0 and over, adult: Secondary | ICD-10-CM | POA: Insufficient documentation

## 2014-01-26 DIAGNOSIS — J4489 Other specified chronic obstructive pulmonary disease: Secondary | ICD-10-CM | POA: Insufficient documentation

## 2014-01-26 DIAGNOSIS — J449 Chronic obstructive pulmonary disease, unspecified: Secondary | ICD-10-CM | POA: Insufficient documentation

## 2014-01-26 DIAGNOSIS — Z7982 Long term (current) use of aspirin: Secondary | ICD-10-CM | POA: Insufficient documentation

## 2014-01-26 DIAGNOSIS — Z9884 Bariatric surgery status: Secondary | ICD-10-CM | POA: Insufficient documentation

## 2014-01-26 DIAGNOSIS — M129 Arthropathy, unspecified: Secondary | ICD-10-CM | POA: Insufficient documentation

## 2014-01-26 DIAGNOSIS — E785 Hyperlipidemia, unspecified: Secondary | ICD-10-CM

## 2014-01-26 DIAGNOSIS — K219 Gastro-esophageal reflux disease without esophagitis: Secondary | ICD-10-CM | POA: Insufficient documentation

## 2014-01-26 DIAGNOSIS — E669 Obesity, unspecified: Secondary | ICD-10-CM | POA: Insufficient documentation

## 2014-01-26 DIAGNOSIS — I10 Essential (primary) hypertension: Secondary | ICD-10-CM | POA: Insufficient documentation

## 2014-01-26 DIAGNOSIS — K439 Ventral hernia without obstruction or gangrene: Secondary | ICD-10-CM | POA: Insufficient documentation

## 2014-01-26 NOTE — H&P (Addendum)
GASTROINTESTINAL SURGERY INITIAL EVALUATION:    Patient: Kelsey [name]  D.O.B.: August 14, 1951  MRN# [id number]  Date of Service: 01/26/2014    Referring Provider: No ref. provider found    PCP: Kelsey [name]    Chief Complaint: Ventral hernia    HPI  Kelsey [name] is a 63 y.o. female who presents today for surgical evaluation of a ventral hernia. History was obtained from the patient and prior medical records. To note, the patient has a significant PMH of HTN, HLD, asthma, GERD, recurrent ventral hernia s/p two repairs (1998, 1999 in [place], [place]) and morbid obesity (BMI 56) s/p gastric bypass approximately 25 years ago. However, given significant weight gain following the latter procedure, she sought evaluation for and underwent lap band procedure in [place], [place] in 2013. This unfortunately lead to postoperative infection requiring multiple hospital admissions and exploratory laparotomy with Dr. Phylliss [name] in the 3 months following to assess the cause. She is unsure if an etiology was ever established.    In regards to the hernia, however, she notes that following her last repair (with mesh in 1999), she developed a recurrent hernia around 2003. First, it started out small, yet when it began to grow in size and discomfort, she sought evaluation by her PCP last week. At that time, she also complained of foul smelling drainage coming from the umbilicus with subjective constipation. Given these findings, she was thus ordered a CTAP (which she completed at [facility] on 01/20/14) and recommended a colonoscopy. She was therefore referred to a local surgeon who suggested referral to a tertiary facility. In addition, she was given a 10-day course of antibiotics.     Today, she notes the above mentioned history. She has noticed no further drainage from the umbilicus after starting the antibiotics. She denies fever or chills. She notes that she does follow a strict diet yet does not get much in the  way of physical activity. Her main concerns surround the complications of a hernia repair given her extensive abdominal history.    Allergies:  Allergies   Allergen Reactions    Shellfish Containing Products Shortness of Breath, Rash and Itching     scallops       Current Outpatient Prescriptions  Outpatient Prescriptions Marked as Taking for the 01/26/14 encounter (Office Visit) with Deliah Boston, Oncology Surg   Medication Sig    aspirin (ECOTRIN) 81 mg Oral Tablet, Delayed Release (E.C.) Take 81 mg by mouth Once a day    atorvastatin (LIPITOR) 10 mg Oral Tablet Take 10 mg by mouth Once a day    budesonide-formoterol (SYMBICORT) 160-4.5 mcg/actuation Inhalation HFA Aerosol Inhaler Take 2 Puffs by inhalation Twice daily    hydrochlorothiazide (HYDRODIURIL) 25 mg Oral Tablet Take 25 mg by mouth Once a day    METOPROLOL SUCCINATE ORAL Take by mouth    montelukast (SINGULAIR) 10 mg Oral Tablet Take 10 mg by mouth Every evening    moxifloxacin (AVELOX) 400 mg Oral Tablet Take 400 mg by mouth Once a day    omeprazole (PRILOSEC) 20 mg Oral Capsule, Delayed Release(E.C.) Take 20 mg by mouth Once a day    SOLIFENACIN SUCCINATE (VESICARE ORAL) Take by mouth    traZODone (DESYREL) 50 mg Oral Tablet Take 50 mg by mouth Every night    vitamin E (AQUASOL E) 400 unit Oral Capsule Take 400 Units by mouth Once a day        Past Medical History:    Past Medical History  Diagnosis Date    HTN (hypertension)     Hyperlipidemia     Obesity     Arthropathy, unspecified, site unspecified     Asthma     Sleep apnea     Cancer      cervical    COPD (chronic obstructive pulmonary disease)     Ventral hernia        Past Surgical History:    Past Surgical History   Procedure Laterality Date    Hx lap banding       2013    Hx cervical cone biopsy       Hx gastric bypass       25 years ago    Hx hysterectomy       late 1990s    Hx cesarean section      Hx hernia repair       1998, 1999 (x2)    Hx laparotomy       2013 to  remove lap band       Family History:  Family History   Problem Relation Age of Onset    Diabetes Mother     Heart Attack Mother     Diabetes Maternal Grandmother     Diabetes Maternal Grandfather     Diabetes Father     Stroke Father        Social History:    The patient is currently married and lives in [place], [place]. 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 She does not drink ETOH or use tobacco. She denies illicit drug use or chemical/radiation exposure.    ROS:  Constitutional: endorses no pertinent postitives; denies fevers, chills, night sweats and anorexia  HEENT: endorses periodontal disease; denies headache, dizziness, vertigo, blurred/double vision, cataracts, glaucoma, changes in voice and difficulty swallowing  Cardiovascular: endorses palpitations; denies chest pain, prior heart attack, heart murmur, irregular heart beat, prior cardiac surgery, prior cardiac stenting and prior rheumatic fever  Respiratory: endorses SOB, asthma, sleep apnea and DOE; denies COPD, emphysema, requires oxygen and prior TB  Gastrointestinal (GI): endorses abdominal pain, constipation, GERD and PUD; denies nausea, vomiting, diarrhea, hematochezia, melena, hematemesis and colonoscopy/endoscopy  Genitourinary (GU): endorses urinary incontinence; denies dysuria and hematuria  Gynecologic (GYN): endorses no pertinent postives; denies vaginal discharge and abnormal vaginal bleeding   Musculoskeletal: endorses left knee and elbow pain s/p fall; denies weakness in arms/legs  Hematologic: endorses no pertinent positives; denies prior PE, prior DVT, excessive bleeding, known bleeding diathesis and lymphadenopathy  Endocrine: endorsesno pertinent postivies; denies diabetes and thyroid disease  Neurological: endorses no pertinent positives; denies prior TIA and prior stroke    Objective:  BP 130/80    Pulse 67    Temp(Src) 36.3 C  (97.3 F) (Thermal Scan)    Ht 1.6 m (5\' 3" )    Wt 145.7 kg (321 lb 3.4 oz)    BMI 56.91 kg/m2      SpO2 96%     Physical Exam:  Constitutional: Very pleasant, AAOx3, WDWN, NAD  HEENT: Normocephalic, atraumatic. PERRLA.   Neck: Trachea midline, supple. No cervical or supraclavicular lymphadenopathy noted on palpation.  Cardiovascular: RRR; No murmurs, rubs, or gallops present. S1, S2 normal.  Pulmonary: Lungs CTA bilaterally; No wheezes, rales, or rhonchi observed. Normal respiratory effort. No retractions.  Abdomen: Abdomen soft and supple yet limited to examination due to patient's body habitus; Mild tenderness in the supraumbilical region,  where I believe the largest hernia defect is noted. Difficult to palpate true defect sizes given multiple hernias on CT scan. No evidence of acute complications. Non-distended; No masses or HSM present. Bowel sounds normal and active in all 4 quadrants. No further drainage, erythema, infection, or induration surrounding the umbilicus.  Extremities: No peripheral edema; No cyanosis or clubbing of nails.  Musculoskeletal: Normal muscle strength and tone of all four extremities.  Skin: No rashes or lesions present; Warm and dry. No jaundice.  Psychiatric: Normal mood and affect; Judgement and thought content normal.    Data:  Imaging studies:  1. 01/20/14 - [place]: This imaging was viewed by myself personally at the visit today (report and images via [employer]). Per the report, there is no acute process or abnormality in the region of the umbilicus. No fluid collection or abscess. Multiple ventral hernias again noted, slightly progressive compared to prior exam with most containing omentum and/or obstructed loops of colon. At least one hernia contains a small loop of non-obstructed small bowel. Hepatic steatosis.    Assessment:  Kelsey [name] is a 63 yo female with a significant PMH of HTN, HLD, asthma, GERD, recurrent ventral hernia s/p two repairs  (1998, 1999) and morbid obesity (BMI 56) s/p gastric bypass approximately 25 years ago. Large recurrent ventral incisional hernia(s), starting in the midline incision. Difficult to palpate exact defect sizes, yet no acute complications noted. No drainage, erythema, infection, or induration of the umbilicus signifying infection.    Past Medical History   Diagnosis Date    HTN (hypertension)     Hyperlipidemia     Obesity     Arthropathy, unspecified, site unspecified     Asthma     Sleep apnea     Cancer      cervical    COPD (chronic obstructive pulmonary disease)     Ventral hernia          Plan:  1. I counseled the patient on her PMH, recent medical course, and physical examination from today. As stated above, she has developed a ventral incisional hernia (almost a swiss cheese defect) in the midline abdominal incision following an extensive abdominal history starting with gastric bypass approximately 25 years ago. Per both clinical examination and CTAP from last week at [facility], it is quite large yet without acute complication.  2. With that being said, we thoroughly reviewed hernias themselves including that they are defects in the abdominal wall that allow omentum, intraperitoneal fat, or even intestines to bulge through at this site. Hernias can be repaired in either an elective or emergent fashion, with emergent repairs occuring in the face of intestinal obstruction, incarceration, or strangulation. Each of these conditions were reviewed in depth, as well as their associated symptoms.  3. If the patient has no signs or symptoms of these conditions, the repair then moves to elective in nature. In these circumstances, I explained, we strive for all modifiable parameters such as weight, DM, and smoking status to be in control prior to surgery to have the best chance of no recurrence of the hernia in the future. This is especially important in her case, given her multiple abdominal surgeries,  morbid obesity, and presumed adhesion formation at the area of the hernia.  4. Currently, her BMI is 56 which is above the recommended value of 30 which our department requests prior to entertaining surgical intervention. Because of this, I have encouraged significant weight loss, as this decreases  the amount of intraabdominal pressure in this area. She was encouraged to continue her diet, yet advance her physical activity via walking or recumbent bicycle to help lose weight. I have also offered her referral to our bariatric department for evaluation regarding her prior failed gastric bypass/lap band. This has been placed.  5. I also encouraged continued use of stool softeners with the addition of fiber for management of constipation and to prevent straining. At this time, she does not have any clinical or radiographic evidence of bowel obstruction. I also reinforced the importance of screening colonoscopy and offered referral to our colorectal surgeon here at [facility] to complete this procedure. However, given the far travel distance, she would like to find someone closer to home. I have encouraged her to discuss this further with her PCP.  6. As Mrs. Macioce does not have any evidence of emergent conditions (strangulation, incarceration, or obstruction) at this time related to the hernia, only reports of slight abdominal discomfort in this area and her BMI is 56, I have stated that elective repair should be postponed until all modifiable parameters can be adjusted accordingly to reduce the risk associated with the procedure and increase the success of the surgery itself. We extensively discussed risk vs. Benefit with proceeding with hernia repair at this point in time, paying particular attention to hernia recurrence, injury to surrounding structures such as bowel possibly requiring bowel resection, infection, and wound complications. I explained that hernia surgery in her case would most likely require a joint plastic  procedure known as component separation given the size of the hernia(s) therefore each modifiable factor needs to be in line prior to proceeding. This is why appropriate recommendations are made. She verbalized understanding.  7. In the meantime, she was encouraged to remain vigilant for any signs or symptoms of the conditions that would warrant an emergent hernia repair, such as strangulation, incarceration, and/or intestinal obstruction. Symptoms would include severe abdominal pain that far exceeds her typical pain baseline, intractable vomiting, fever, tachycardia, inability to reduce the hernia, or inability to have a BM. I explained that if any of these were to occur, she should seek immediate medical attention as surgical intervention for the hernia may move from an elective procedure to emergent repair.  8. Mrs. Hidrogo was given the opportunity to ask questions, and those questions were appropriately answered. She agreed with the treatment plan and was encouraged to call with any additional questions or concerns. She was also provided with my business card to open communication lines in the future and to also contact me if the above recommendations have been completed and further hernia assessment is warranted. Otherwise, she will return to clinic in 6 months for re-evaluation of her progress. Patient was seen independently.    [address] [place], PA-C 01/26/2014, 12:55  [facility]

## 2014-01-27 ENCOUNTER — Ambulatory Visit (INDEPENDENT_AMBULATORY_CARE_PROVIDER_SITE_OTHER): Payer: Medicare Other | Admitting: Physician Assistant

## 2014-04-16 ENCOUNTER — Other Ambulatory Visit (INDEPENDENT_AMBULATORY_CARE_PROVIDER_SITE_OTHER): Payer: Medicare Other | Admitting: Surgery

## 2014-04-16 DIAGNOSIS — Z1211 Encounter for screening for malignant neoplasm of colon: Secondary | ICD-10-CM

## 2014-05-03 ENCOUNTER — Other Ambulatory Visit: Payer: Self-pay | Admitting: Surgery

## 2014-05-03 DIAGNOSIS — C187 Malignant neoplasm of sigmoid colon: Secondary | ICD-10-CM

## 2014-05-03 DIAGNOSIS — D126 Benign neoplasm of colon, unspecified: Secondary | ICD-10-CM

## 2014-05-03 DIAGNOSIS — Z1211 Encounter for screening for malignant neoplasm of colon: Secondary | ICD-10-CM

## 2014-05-11 ENCOUNTER — Encounter (INDEPENDENT_AMBULATORY_CARE_PROVIDER_SITE_OTHER): Payer: Medicare Other | Admitting: Surgery

## 2014-05-11 DIAGNOSIS — C801 Malignant (primary) neoplasm, unspecified: Secondary | ICD-10-CM

## 2014-05-20 HISTORY — PX: CARDIAC CATHETERIZATION: SHX172

## 2014-05-28 ENCOUNTER — Encounter (INDEPENDENT_AMBULATORY_CARE_PROVIDER_SITE_OTHER): Payer: Medicare Other | Admitting: Surgery

## 2014-05-28 DIAGNOSIS — C187 Malignant neoplasm of sigmoid colon: Secondary | ICD-10-CM

## 2014-06-09 ENCOUNTER — Ambulatory Visit (INDEPENDENT_AMBULATORY_CARE_PROVIDER_SITE_OTHER): Payer: Self-pay | Admitting: Physician Assistant

## 2014-06-10 ENCOUNTER — Other Ambulatory Visit: Payer: Self-pay | Admitting: Surgery

## 2014-06-11 DIAGNOSIS — C187 Malignant neoplasm of sigmoid colon: Secondary | ICD-10-CM

## 2014-06-11 DIAGNOSIS — K660 Peritoneal adhesions (postprocedural) (postinfection): Secondary | ICD-10-CM

## 2014-06-11 DIAGNOSIS — C772 Secondary and unspecified malignant neoplasm of intra-abdominal lymph nodes: Secondary | ICD-10-CM

## 2014-06-22 ENCOUNTER — Encounter (INDEPENDENT_AMBULATORY_CARE_PROVIDER_SITE_OTHER): Payer: Medicare Other | Admitting: Surgery

## 2014-06-22 DIAGNOSIS — Z09 Encounter for follow-up examination after completed treatment for conditions other than malignant neoplasm: Secondary | ICD-10-CM

## 2014-07-06 ENCOUNTER — Encounter (INDEPENDENT_AMBULATORY_CARE_PROVIDER_SITE_OTHER): Payer: Self-pay | Admitting: HEMATOLOGY/ONCOLOGY

## 2014-07-06 ENCOUNTER — Ambulatory Visit (INDEPENDENT_AMBULATORY_CARE_PROVIDER_SITE_OTHER): Payer: Medicare Other | Admitting: HEMATOLOGY/ONCOLOGY

## 2014-07-06 VITALS — BP 182/80 | HR 74 | Ht 63.0 in | Wt 311.0 lb

## 2014-07-06 DIAGNOSIS — C189 Malignant neoplasm of colon, unspecified: Secondary | ICD-10-CM

## 2014-07-06 DIAGNOSIS — C187 Malignant neoplasm of sigmoid colon: Secondary | ICD-10-CM

## 2014-07-06 HISTORY — DX: Malignant neoplasm of colon, unspecified: C18.9

## 2014-07-07 NOTE — H&P (Signed)
[place]  [place], [place] [address]  [phone]      OFFICE VISIT    PATIENT NAME:         Kelsey [name], Kelsey [name]  VISIT IDENTIFICATION     [id number]  MEDICAL RECORD NUMBER [id number]    DICTATING PHYSICIAN: Lyn Hollingshead, MD   REFERRING PHYSICIAN:         DOB:     06-25-1951  DOS: 07/06/2014    cc:  Domingo Madeira, MD  Duard Brady, MD    REASON FOR CONSULTATION:  Establishing care for stage III colon cancer.     HISTORY OF PRESENT ILLNESS:  This is a 63 year old female with a past medical history significant for  morbid obesity, hypertension, hyperlipidemia, multiple hernia repair, and  cervical cancer who was diagnosed with stage III colon cancer recently. She  was found to have a polyp which was adenocarcinoma on a sigmoidoscopy in  September 2015.  On  June 10, 2014, she was admitted for segmental  resection of the sigmoid colon. This revealed PT3, N1a, MX, low-grade  adenocarcinoma of the colon.  The mass measuring 4 x 3 x 0.5 centimeters. It  was low-grade and there was no evidence of microsatellite instability by  histology.  Margins were uninvolved and 4 lymph nodes only were removed which  only 1 was involved.  This corresponding stage IIIB, T3, N1a, M0, low-grade  sigmoid cancer.    The patient required recovered well and today comes in to assess for further  evaluation.    She is doing well and has no complaints of symptoms. She denies nausea,  vomiting, or abdominal pain.     REVIEW OF SYSTEMS:  Review of systems is, otherwise, negative.     PAST MEDICAL HISTORY:  Hypertension, hyperlipidemia, obesity, asthma, colon cancer stage III, and  COPD.     PAST SURGICAL HISTORY:  Lap-banding gastric bypass, hysterectomy, C section, hernia repair, and  laparoscopy.       FAMILY HISTORY:  Father and mother with diabetes and heart attacks.     SOCIAL HISTORY:  She is a former smoker, quit 4 years ago, denies alcohol intake or illicit  drugs.     ALLERGIES:  SHELLFISH and IODINE.     PHYSICAL  EXAMINATION:  VITAL SIGNS:   Height:  63.  Weight:  ____. Blood pressure 182/80.  Pulse is  84.  Saturating 97%.  She is alert and oriented x3.     GENERAL:  She is and oriented x3.   HEENT:  Ears and nose are normal. Oral mucosa is moist.   LUNGS:  Clear to auscultation.   ABDOMEN:  Soft, but obese.  Midline incision is well healed, just mild  erythema around the wound.     EXTREMITIES:  No edema, cyanosis, or clubbing.     LABORATORY AND X-RAY DATA:   CBC:  White count 7, hemoglobin 9.6, and platelet count 236, creatinine of  0.6. Pathology was reviewed again and as summarized above.  A CT scan of the  abdomen and pelvis on May 25, 2014, shows fatty liver and hepatomegaly,  gastric bypass Roux-en-Y but there is no evidence of metastatic disease.         ASSESSMENT:  Stage IIIB, PT3, TN1a, MX, adenocarcinoma of the sigmoid low-grade status post  resection.     DISCUSSION:  I reviewed the case with the patient.  Again the patient is at 30% disease  recurrence due to lymph node involvement.  For now, I recommend adjuvant  chemotherapy regimen is FOLFOX, which is oxaliplatin 5-FU. Side effects of the  regimen were discussed including nausea, vomiting, neuropathy, neutropenic  fevers.  The patient       agreed and will be planning on starting the first week of December and I will  be referring her back to Dr. Laurena Bering for port placement.    Thank you very much for the consultation.                                        Lyn Hollingshead, MD                                            d:  07/06/2014 16:01:07  t:  07/07/2014 15:31:33  jr  doc#:  [id number]  voice#:  [id number]  <START FOOTER> Page 2 of 3  <end footer>

## 2014-07-20 ENCOUNTER — Encounter (INDEPENDENT_AMBULATORY_CARE_PROVIDER_SITE_OTHER): Payer: Medicare Other | Admitting: Surgery

## 2014-07-20 DIAGNOSIS — C189 Malignant neoplasm of colon, unspecified: Secondary | ICD-10-CM

## 2014-07-23 ENCOUNTER — Encounter (INDEPENDENT_AMBULATORY_CARE_PROVIDER_SITE_OTHER): Payer: Medicare Other | Admitting: Surgery

## 2014-07-23 DIAGNOSIS — C189 Malignant neoplasm of colon, unspecified: Secondary | ICD-10-CM

## 2014-07-26 ENCOUNTER — Encounter (INDEPENDENT_AMBULATORY_CARE_PROVIDER_SITE_OTHER): Payer: Medicare Other | Admitting: Physician Assistant

## 2014-07-27 ENCOUNTER — Encounter (INDEPENDENT_AMBULATORY_CARE_PROVIDER_SITE_OTHER): Payer: Self-pay | Admitting: HEMATOLOGY/ONCOLOGY

## 2014-07-27 ENCOUNTER — Ambulatory Visit (INDEPENDENT_AMBULATORY_CARE_PROVIDER_SITE_OTHER): Payer: Medicare Other | Admitting: HEMATOLOGY/ONCOLOGY

## 2014-07-27 VITALS — BP 153/78 | HR 84 | Temp 96.8°F | Ht 63.0 in | Wt 298.4 lb

## 2014-07-27 DIAGNOSIS — C189 Malignant neoplasm of colon, unspecified: Secondary | ICD-10-CM

## 2014-07-27 DIAGNOSIS — C187 Malignant neoplasm of sigmoid colon: Secondary | ICD-10-CM

## 2014-07-27 DIAGNOSIS — Z5111 Encounter for antineoplastic chemotherapy: Secondary | ICD-10-CM

## 2014-07-27 DIAGNOSIS — R112 Nausea with vomiting, unspecified: Secondary | ICD-10-CM

## 2014-07-27 MED ORDER — LIDOCAINE-PRILOCAINE 2.5 %-2.5 % TOPICAL CREAM
TOPICAL_CREAM | Freq: Once | CUTANEOUS | Status: AC
Start: 2014-07-27 — End: 2014-07-27

## 2014-07-27 MED ORDER — DEXAMETHASONE 4 MG TABLET
8.0000 mg | ORAL_TABLET | Freq: Every day | ORAL | Status: DC
Start: 2014-07-27 — End: 2015-04-19

## 2014-07-27 MED ORDER — PROCHLORPERAZINE MALEATE 10 MG TABLET
10.00 mg | ORAL_TABLET | Freq: Four times a day (QID) | ORAL | Status: DC | PRN
Start: 2014-07-27 — End: 2018-11-03

## 2014-07-27 NOTE — Progress Notes (Signed)
[facility]  [place] [address]  [phone]          Encounter Date: 07/27/2014  9:30 AM EST      Name: Kelsey [name]  Age: 63 y.o.  DOB: 1951-05-07  Sex: female    Chief Complaint:   Chief Complaint   Patient presents with    Chemotherapy     Treatment C1 folfox        HPI  This is a 63 year old female with a past medical history significant for   morbid obesity, hypertension, hyperlipidemia, multiple hernia repair, and   cervical cancer who was diagnosed with stage III colon cancer here for follow up.  She  was found to have a polyp which was adenocarcinoma on a sigmoidoscopy in   September 2015. On June 10, 2014, she was admitted for segmental   resection of the sigmoid colon. This revealed pT3, N1a, MX, low-grade   adenocarcinoma of the colon. The mass measuring 4 x 3 x 0.5 centimeters. It   was low-grade and there was no evidence of microsatellite instability by   histology. Margins were uninvolved and 4 lymph nodes only were removed which   only 1 was involved. This corresponding stage IIIB, T3, N1a, M0, low-grade   sigmoid cancer.   On 07/07/2014 Recommended to undergo 12 cycles of FOLFOX as adjuvant therapy.    Interval History:  -doing well,  -had a port placed last week, we had some difficulties with drawing from port  She underwent a port-gram and showed that needle was not deep enough a longer needle was  Used (1.5 inch) and appropriate access was obtained.    History   PAST MEDICAL HISTORY:  Hypertension, hyperlipidemia, obesity, asthma, colon cancer stage III, and  COPD.     PAST SURGICAL HISTORY:  Lap-banding gastric bypass, hysterectomy, C section, hernia repair, and  laparoscopy.     FAMILY HISTORY:  Father and mother with diabetes and heart attacks.     SOCIAL HISTORY:  She is a former smoker, quit 4 years ago, denies alcohol intake or illicit  drugs.     Review of Systems   Constitutional: Negative.    HENT: Negative.    Eyes: Negative.       Respiratory: Negative.    Cardiovascular: Negative.    Genitourinary: Negative.        Examination  Vitals: BP 153/78 mmHg   Pulse 84   Temp(Src) 36 C (96.8 F)   Ht 1.6 m (_0 )   Wt 135.353 kg (298 lb 6.4 oz)   BMI 52.87 kg/m2   SpO2 96%  Physical Exam   Constitutional: She is oriented to person, place, and time. She appears well-developed and well-nourished.   HENT:   Head: Normocephalic.   Right Ear: External ear normal.   Eyes: Conjunctivae are normal. Pupils are equal, round, and reactive to light. Right eye exhibits no discharge.   Neck: Normal range of motion. Neck supple.   Cardiovascular: Normal rate and normal heart sounds.    Abdominal: Soft.   Neurological: She is alert and oriented to person, place, and time. No cranial nerve deficit.   Skin: Skin is warm and dry.     Ortho Exam     Lab  Cbc 6.2/12.1/328  cmp   Cr 0.9, AST/ALT WNL    Port-gram initially needle was not positioned     Assessment and Plan  1. Stage IIIB, pT3, TN1a, MX, adenocarcinoma of  the sigmoid low-grade status post  Resection.   - recommend 12 cycles of FOLFOX as adjuvant chemotherapy (60% risk reduction for recurrent disease)   - Regimen    - Oxaliplatin 85 mg/m2    - bolus 5FU/LV 400 mg/m2     - Inf 5FU 2400 mg/m2 over 46 hrs    - BSA 2.4 (actuall weight)    2. CINV   - Prophylaxis : Aloxi and Dexamethazone for 3 days   - PRN: compazine    3. Port (venous access)   - issue today but were managed with a longer port needle.    Orders Placed This Encounter    prochlorperazine (COMPAZINE) 10 mg Oral Tablet    dexamethasone (DECADRON) 4 mg Oral Tablet         Return in about 4 weeks (around 08/24/2014) for Treatment C3 FOLFOX.      Lyn Hollingshead, MD

## 2014-07-29 DIAGNOSIS — Z5111 Encounter for antineoplastic chemotherapy: Secondary | ICD-10-CM | POA: Diagnosis not present

## 2014-07-29 DIAGNOSIS — Z452 Encounter for adjustment and management of vascular access device: Secondary | ICD-10-CM | POA: Diagnosis not present

## 2014-07-29 DIAGNOSIS — C189 Malignant neoplasm of colon, unspecified: Secondary | ICD-10-CM | POA: Diagnosis not present

## 2014-08-03 ENCOUNTER — Telehealth (INDEPENDENT_AMBULATORY_CARE_PROVIDER_SITE_OTHER): Payer: Self-pay | Admitting: HEMATOLOGY/ONCOLOGY

## 2014-08-03 MED ORDER — LOPERAMIDE 2 MG CAPSULE
4.00 mg | ORAL_CAPSULE | ORAL | Status: DC | PRN
Start: 2014-08-03 — End: 2014-10-05

## 2014-08-03 NOTE — Telephone Encounter (Signed)
Patient needs a refill on medication that will stop her diarrhea she is getting very weak [phone]

## 2014-08-03 NOTE — Telephone Encounter (Signed)
Rx for imodium was sent to [facility]  -plz notify pt

## 2014-08-06 ENCOUNTER — Encounter (INDEPENDENT_AMBULATORY_CARE_PROVIDER_SITE_OTHER): Payer: Medicare Other | Admitting: Surgery

## 2014-08-06 DIAGNOSIS — Z09 Encounter for follow-up examination after completed treatment for conditions other than malignant neoplasm: Secondary | ICD-10-CM

## 2014-08-24 ENCOUNTER — Ambulatory Visit (INDEPENDENT_AMBULATORY_CARE_PROVIDER_SITE_OTHER): Payer: Medicare Other | Admitting: HEMATOLOGY/ONCOLOGY

## 2014-08-24 ENCOUNTER — Encounter (INDEPENDENT_AMBULATORY_CARE_PROVIDER_SITE_OTHER): Payer: Self-pay | Admitting: HEMATOLOGY/ONCOLOGY

## 2014-08-24 VITALS — BP 151/72 | HR 80 | Temp 97.4°F | Ht 63.0 in | Wt 296.6 lb

## 2014-08-24 DIAGNOSIS — Z5111 Encounter for antineoplastic chemotherapy: Secondary | ICD-10-CM | POA: Diagnosis not present

## 2014-08-24 DIAGNOSIS — R112 Nausea with vomiting, unspecified: Secondary | ICD-10-CM | POA: Diagnosis not present

## 2014-08-24 DIAGNOSIS — C187 Malignant neoplasm of sigmoid colon: Secondary | ICD-10-CM | POA: Diagnosis not present

## 2014-08-24 DIAGNOSIS — C189 Malignant neoplasm of colon, unspecified: Secondary | ICD-10-CM | POA: Diagnosis not present

## 2014-08-24 DIAGNOSIS — T451X5A Adverse effect of antineoplastic and immunosuppressive drugs, initial encounter: Secondary | ICD-10-CM | POA: Diagnosis not present

## 2014-08-26 DIAGNOSIS — Z5111 Encounter for antineoplastic chemotherapy: Secondary | ICD-10-CM | POA: Diagnosis not present

## 2014-08-26 DIAGNOSIS — Z452 Encounter for adjustment and management of vascular access device: Secondary | ICD-10-CM | POA: Diagnosis not present

## 2014-08-26 DIAGNOSIS — C189 Malignant neoplasm of colon, unspecified: Secondary | ICD-10-CM | POA: Diagnosis not present

## 2014-08-27 ENCOUNTER — Telehealth (INDEPENDENT_AMBULATORY_CARE_PROVIDER_SITE_OTHER): Payer: Self-pay | Admitting: HEMATOLOGY/ONCOLOGY

## 2014-08-27 ENCOUNTER — Other Ambulatory Visit (INDEPENDENT_AMBULATORY_CARE_PROVIDER_SITE_OTHER): Payer: Self-pay | Admitting: HEMATOLOGY/ONCOLOGY

## 2014-08-27 DIAGNOSIS — C189 Malignant neoplasm of colon, unspecified: Secondary | ICD-10-CM

## 2014-08-27 NOTE — Telephone Encounter (Signed)
Patient called in stating that she is running a fever of 100.4. She has been taking tylenol and it doesn't seem to be helping. Is there anything else that she can do?

## 2014-08-29 ENCOUNTER — Emergency Department (EMERGENCY_DEPARTMENT_HOSPITAL): Payer: Medicare Other

## 2014-08-29 ENCOUNTER — Emergency Department (EMERGENCY_DEPARTMENT_HOSPITAL): Admission: EM | Admit: 2014-08-29 | Discharge: 2014-08-29 | Payer: Medicare Other

## 2014-08-29 DIAGNOSIS — R109 Unspecified abdominal pain: Secondary | ICD-10-CM | POA: Diagnosis not present

## 2014-08-29 DIAGNOSIS — K760 Fatty (change of) liver, not elsewhere classified: Secondary | ICD-10-CM | POA: Diagnosis not present

## 2014-08-29 DIAGNOSIS — E46 Unspecified protein-calorie malnutrition: Secondary | ICD-10-CM | POA: Diagnosis not present

## 2014-08-29 DIAGNOSIS — K651 Peritoneal abscess: Secondary | ICD-10-CM | POA: Diagnosis not present

## 2014-08-29 DIAGNOSIS — I480 Paroxysmal atrial fibrillation: Secondary | ICD-10-CM | POA: Diagnosis present

## 2014-08-29 DIAGNOSIS — R197 Diarrhea, unspecified: Secondary | ICD-10-CM | POA: Diagnosis not present

## 2014-08-29 DIAGNOSIS — E780 Pure hypercholesterolemia: Secondary | ICD-10-CM | POA: Diagnosis present

## 2014-08-29 DIAGNOSIS — K219 Gastro-esophageal reflux disease without esophagitis: Secondary | ICD-10-CM | POA: Diagnosis not present

## 2014-08-29 DIAGNOSIS — R1011 Right upper quadrant pain: Secondary | ICD-10-CM | POA: Diagnosis not present

## 2014-08-29 DIAGNOSIS — R112 Nausea with vomiting, unspecified: Secondary | ICD-10-CM | POA: Diagnosis not present

## 2014-08-29 DIAGNOSIS — C187 Malignant neoplasm of sigmoid colon: Secondary | ICD-10-CM | POA: Diagnosis not present

## 2014-08-29 DIAGNOSIS — E876 Hypokalemia: Secondary | ICD-10-CM | POA: Diagnosis not present

## 2014-08-29 DIAGNOSIS — Z85038 Personal history of other malignant neoplasm of large intestine: Secondary | ICD-10-CM | POA: Diagnosis not present

## 2014-08-29 DIAGNOSIS — R509 Fever, unspecified: Secondary | ICD-10-CM | POA: Diagnosis not present

## 2014-08-29 DIAGNOSIS — Z87891 Personal history of nicotine dependence: Secondary | ICD-10-CM | POA: Diagnosis not present

## 2014-08-29 DIAGNOSIS — I10 Essential (primary) hypertension: Secondary | ICD-10-CM | POA: Diagnosis not present

## 2014-08-29 DIAGNOSIS — Z8541 Personal history of malignant neoplasm of cervix uteri: Secondary | ICD-10-CM | POA: Diagnosis not present

## 2014-08-29 DIAGNOSIS — E785 Hyperlipidemia, unspecified: Secondary | ICD-10-CM | POA: Diagnosis present

## 2014-08-29 DIAGNOSIS — L02211 Cutaneous abscess of abdominal wall: Secondary | ICD-10-CM | POA: Diagnosis not present

## 2014-08-29 DIAGNOSIS — I700 Atherosclerosis of aorta: Secondary | ICD-10-CM | POA: Diagnosis not present

## 2014-08-29 DIAGNOSIS — J45909 Unspecified asthma, uncomplicated: Secondary | ICD-10-CM | POA: Diagnosis present

## 2014-08-29 DIAGNOSIS — C189 Malignant neoplasm of colon, unspecified: Secondary | ICD-10-CM | POA: Diagnosis not present

## 2014-08-29 DIAGNOSIS — J069 Acute upper respiratory infection, unspecified: Secondary | ICD-10-CM | POA: Diagnosis not present

## 2014-08-29 DIAGNOSIS — Z9049 Acquired absence of other specified parts of digestive tract: Secondary | ICD-10-CM | POA: Diagnosis present

## 2014-08-29 DIAGNOSIS — Z6841 Body Mass Index (BMI) 40.0 and over, adult: Secondary | ICD-10-CM | POA: Diagnosis not present

## 2014-08-30 DIAGNOSIS — C187 Malignant neoplasm of sigmoid colon: Secondary | ICD-10-CM

## 2014-08-30 DIAGNOSIS — E46 Unspecified protein-calorie malnutrition: Secondary | ICD-10-CM

## 2014-08-30 DIAGNOSIS — L02211 Cutaneous abscess of abdominal wall: Secondary | ICD-10-CM

## 2014-09-02 DIAGNOSIS — C189 Malignant neoplasm of colon, unspecified: Secondary | ICD-10-CM | POA: Diagnosis not present

## 2014-09-02 DIAGNOSIS — Z452 Encounter for adjustment and management of vascular access device: Secondary | ICD-10-CM | POA: Diagnosis not present

## 2014-09-02 DIAGNOSIS — Z5111 Encounter for antineoplastic chemotherapy: Secondary | ICD-10-CM | POA: Diagnosis not present

## 2014-09-03 DIAGNOSIS — Z452 Encounter for adjustment and management of vascular access device: Secondary | ICD-10-CM | POA: Diagnosis not present

## 2014-09-03 DIAGNOSIS — Z5111 Encounter for antineoplastic chemotherapy: Secondary | ICD-10-CM | POA: Diagnosis not present

## 2014-09-03 DIAGNOSIS — C189 Malignant neoplasm of colon, unspecified: Secondary | ICD-10-CM | POA: Diagnosis not present

## 2014-09-07 ENCOUNTER — Encounter (INDEPENDENT_AMBULATORY_CARE_PROVIDER_SITE_OTHER): Payer: Medicare Other | Admitting: HEMATOLOGY/ONCOLOGY

## 2014-09-07 DIAGNOSIS — Z452 Encounter for adjustment and management of vascular access device: Secondary | ICD-10-CM | POA: Diagnosis not present

## 2014-09-07 DIAGNOSIS — C189 Malignant neoplasm of colon, unspecified: Secondary | ICD-10-CM | POA: Diagnosis not present

## 2014-09-07 DIAGNOSIS — Z5111 Encounter for antineoplastic chemotherapy: Secondary | ICD-10-CM | POA: Diagnosis not present

## 2014-09-07 NOTE — Progress Notes (Signed)
[facility]  [place] [address]  [phone]          Encounter Date: 08/24/2014  8:45 AM EST      Name: Kelsey [name]  Age: 64 y.o.  DOB: 08-09-1951  Sex: female    Chief Complaint:   Chief Complaint   Patient presents with    Chemotherapy     Treatment C3 Folfox        HPI  This is a 64 year old female with a past medical history significant for   morbid obesity, hypertension, hyperlipidemia, multiple hernia repair, and   cervical cancer who was diagnosed with stage III colon cancer here for follow up.  She  was found to have a polyp which was adenocarcinoma on a sigmoidoscopy in   September 2015. On June 10, 2014, she was admitted for segmental   resection of the sigmoid colon. This revealed pT3, N1a, MX, low-grade   adenocarcinoma of the colon. The mass measuring 4 x 3 x 0.5 centimeters. It   was low-grade and there was no evidence of microsatellite instability by   histology. Margins were uninvolved and 4 lymph nodes only were removed which   only 1 was involved. This corresponding stage IIIB, T3, N1a, M0, low-grade   sigmoid cancer.   On 07/07/2014 Recommended to undergo 12 cycles of FOLFOX as adjuvant therapy.  07/27/2014 Started Adjuvant chemotherapy with FOLFOX    Interval History:  -did well well with chemotherapy.  - due to miss communication she did not received cycle 2 on 08/11/2014    History   PAST MEDICAL HISTORY:  Hypertension, hyperlipidemia, obesity, asthma, colon cancer stage III, and  COPD.     PAST SURGICAL HISTORY:  Lap-banding gastric bypass, hysterectomy, C section, hernia repair, and  laparoscopy.     FAMILY HISTORY:  Father and mother with diabetes and heart attacks.     SOCIAL HISTORY:  She is a former smoker, quit 4 years ago, denies alcohol intake or illicit  drugs.     Review of Systems   Constitutional: Negative.    HENT: Negative.    Eyes: Negative.    Respiratory: Negative.    Cardiovascular: Negative.    Genitourinary: Negative.           Examination  Vitals: BP 151/72 mmHg   Pulse 80   Temp(Src) 36.3 C (97.4 F)   Ht 1.6 m (_0 )   Wt 134.537 kg (296 lb 9.6 oz)   BMI 52.55 kg/m2   SpO2 98%  Physical Exam   Constitutional: She is oriented to person, place, and time. She appears well-developed and well-nourished.   HENT:   Head: Normocephalic.   Right Ear: External ear normal.   Eyes: Conjunctivae are normal. Pupils are equal, round, and reactive to light. Right eye exhibits no discharge.   Neck: Normal range of motion. Neck supple.   Cardiovascular: Normal rate and normal heart sounds.    Abdominal: Soft.   Neurological: She is alert and oriented to person, place, and time. No cranial nerve deficit.   Skin: Skin is warm and dry.     Ortho Exam     Lab  Cbc 8.1/11.1/244  cmp   Cr 0.9, AST/ALT WNL      Assessment and Plan  1. Stage IIIB, pT3, TN1a, MX, adenocarcinoma of the sigmoid low-grade status post  Resection.   - recommend 12 cycles of FOLFOX as adjuvant chemotherapy (60% risk reduction for recurrent disease)   -  Regimen    - Oxaliplatin 85 mg/m2    - bolus 5FU/LV 400 mg/m2     - Inf 5FU 2400 mg/m2 over 46 hrs    - BSA 2.4 (actuall weight)   C2 today, no dose adjustment    2. CINV   - Prophylaxis : Aloxi and Dexamethazone for 3 days   - PRN: compazine      No orders of the defined types were placed in this encounter.               Lyn Hollingshead, MD

## 2014-09-14 ENCOUNTER — Ambulatory Visit (INDEPENDENT_AMBULATORY_CARE_PROVIDER_SITE_OTHER): Payer: Medicare Other | Admitting: HEMATOLOGY/ONCOLOGY

## 2014-09-14 ENCOUNTER — Ambulatory Visit (INDEPENDENT_AMBULATORY_CARE_PROVIDER_SITE_OTHER): Payer: Self-pay | Admitting: Surgery

## 2014-09-14 ENCOUNTER — Encounter (INDEPENDENT_AMBULATORY_CARE_PROVIDER_SITE_OTHER): Payer: Self-pay | Admitting: HEMATOLOGY/ONCOLOGY

## 2014-09-14 VITALS — BP 155/63 | HR 63 | Temp 96.6°F | Ht 63.0 in | Wt 289.8 lb

## 2014-09-14 DIAGNOSIS — C189 Malignant neoplasm of colon, unspecified: Secondary | ICD-10-CM | POA: Diagnosis not present

## 2014-09-14 DIAGNOSIS — L02211 Cutaneous abscess of abdominal wall: Secondary | ICD-10-CM | POA: Diagnosis not present

## 2014-09-14 DIAGNOSIS — R112 Nausea with vomiting, unspecified: Secondary | ICD-10-CM | POA: Diagnosis not present

## 2014-09-14 DIAGNOSIS — C187 Malignant neoplasm of sigmoid colon: Secondary | ICD-10-CM | POA: Diagnosis not present

## 2014-09-14 NOTE — Progress Notes (Signed)
[facility]  [place] [address]  [phone]          Encounter Date: 09/14/2014  9:00 AM EST      Name: Kelsey [name]  Age: 64 y.o.  DOB: 14-Jan-1951  Sex: female    Chief Complaint:   Chief Complaint   Patient presents with    Chemotherapy     Treatment for colon cancer       HPI  This is a 64 year old female with a past medical history significant for   morbid obesity, hypertension, hyperlipidemia, multiple hernia repair, and   cervical cancer who was diagnosed with stage III colon cancer here for follow up.  She  was found to have a polyp which was adenocarcinoma on a sigmoidoscopy in   September 2015. On June 10, 2014, she was admitted for segmental   resection of the sigmoid colon. This revealed pT3, N1a, MX, low-grade   adenocarcinoma of the colon. The mass measuring 4 x 3 x 0.5 centimeters. It   was low-grade and there was no evidence of microsatellite instability by   histology. Margins were uninvolved and 4 lymph nodes only were removed which   only 1 was involved. This corresponding stage IIIB, T3, N1a, M0, low-grade   sigmoid cancer.   On 07/07/2014 Recommended to undergo 12 cycles of FOLFOX as adjuvant therapy.  07/27/2014 Started Adjuvant chemotherapy with FOLFOX    Interval History:  - was admitted 2 weeks ago due to abdominal wall abscess, now s/p I/D and remains on  Clindamycine.  -wound is smaller and now 2 cm deep.    History   PAST MEDICAL HISTORY:  Hypertension, hyperlipidemia, obesity, asthma, colon cancer stage III, and  COPD.     PAST SURGICAL HISTORY:  Lap-banding gastric bypass, hysterectomy, C section, hernia repair, and  laparoscopy.     FAMILY HISTORY:  Father and mother with diabetes and heart attacks.     SOCIAL HISTORY:  She is a former smoker, quit 4 years ago, denies alcohol intake or illicit  drugs.     Review of Systems   Constitutional: Negative.    HENT: Negative.    Eyes: Negative.    Respiratory: Negative.    Cardiovascular:  Negative.    Genitourinary: Negative.        Examination  Vitals: BP 155/63 mmHg   Pulse 63   Temp(Src) 35.9 C (96.6 F)   Ht 1.6 m (_0 )   Wt 131.452 kg (289 lb 12.8 oz)   BMI 51.35 kg/m2   SpO2 97%  Physical Exam   Constitutional: She is oriented to person, place, and time. She appears well-developed and well-nourished.   HENT:   Head: Normocephalic.   Right Ear: External ear normal.   Eyes: Conjunctivae are normal. Pupils are equal, round, and reactive to light. Right eye exhibits no discharge.   Neck: Normal range of motion. Neck supple.   Cardiovascular: Normal rate and normal heart sounds.    Abdominal: Soft.   Neurological: She is alert and oriented to person, place, and time. No cranial nerve deficit.   Skin: Skin is warm and dry.     Ortho Exam     Lab  Cbc 8.1/11.1/244  cmp   Cr 0.9, AST/ALT WNL      Assessment and Plan  1. Stage IIIB, pT3, TN1a, MX, adenocarcinoma of the sigmoid low-grade status post  Resection.   - recommend 12 cycles of FOLFOX as adjuvant chemotherapy (  60% risk reduction for recurrent disease)   - Regimen    - Oxaliplatin 85 mg/m2    - bolus 5FU/LV 400 mg/m2     - Inf 5FU 2400 mg/m2 over 46 hrs    - BSA 2.4 (actuall weight)   While she is due for cycle 3 today, I will further delay it 1 more week due to active wound    2. CINV   - Prophylaxis : Aloxi and Dexamethazone for 3 days   - PRN: compazine    3. Abdominal wall abscess   - s/p surgery 09/03/2014   - delay chemotherapy for 1 more week   - continue wound care by Home nurse    No orders of the defined types were placed in this encounter.               Lyn Hollingshead, MD

## 2014-09-17 DIAGNOSIS — C189 Malignant neoplasm of colon, unspecified: Secondary | ICD-10-CM | POA: Diagnosis not present

## 2014-09-17 DIAGNOSIS — Z5111 Encounter for antineoplastic chemotherapy: Secondary | ICD-10-CM | POA: Diagnosis not present

## 2014-09-17 DIAGNOSIS — Z452 Encounter for adjustment and management of vascular access device: Secondary | ICD-10-CM | POA: Diagnosis not present

## 2014-09-21 ENCOUNTER — Encounter (INDEPENDENT_AMBULATORY_CARE_PROVIDER_SITE_OTHER): Payer: Medicare Other | Admitting: HEMATOLOGY/ONCOLOGY

## 2014-09-21 ENCOUNTER — Encounter (INDEPENDENT_AMBULATORY_CARE_PROVIDER_SITE_OTHER): Payer: Self-pay | Admitting: HEMATOLOGY/ONCOLOGY

## 2014-09-21 ENCOUNTER — Ambulatory Visit (INDEPENDENT_AMBULATORY_CARE_PROVIDER_SITE_OTHER): Payer: Medicare Other | Admitting: HEMATOLOGY/ONCOLOGY

## 2014-09-21 VITALS — BP 144/79 | HR 71 | Temp 97.5°F | Ht 63.0 in | Wt 286.0 lb

## 2014-09-21 DIAGNOSIS — L02211 Cutaneous abscess of abdominal wall: Secondary | ICD-10-CM | POA: Diagnosis not present

## 2014-09-21 DIAGNOSIS — Z5111 Encounter for antineoplastic chemotherapy: Secondary | ICD-10-CM | POA: Diagnosis not present

## 2014-09-21 DIAGNOSIS — R112 Nausea with vomiting, unspecified: Secondary | ICD-10-CM | POA: Diagnosis not present

## 2014-09-21 DIAGNOSIS — T451X5A Adverse effect of antineoplastic and immunosuppressive drugs, initial encounter: Secondary | ICD-10-CM | POA: Diagnosis not present

## 2014-09-21 DIAGNOSIS — C187 Malignant neoplasm of sigmoid colon: Secondary | ICD-10-CM | POA: Diagnosis not present

## 2014-09-21 DIAGNOSIS — C189 Malignant neoplasm of colon, unspecified: Secondary | ICD-10-CM | POA: Diagnosis not present

## 2014-09-21 NOTE — Progress Notes (Signed)
[facility] [place] [facility]  [place] [address]  [phone]          Encounter Date: 09/21/2014  9:15 AM EST      Name: Kelsey [name]  Age: 64 y.o.  DOB: [birth year]  Sex: female    Chief Complaint:   Chief Complaint   Patient presents with    Chemotherapy     Treatmene Cycle # 3       HPI  This is a 64 year old female with a past medical history significant for   morbid obesity, hypertension, hyperlipidemia, multiple hernia repair, and   cervical cancer who was diagnosed with stage III colon cancer here for follow up.  She  was found to have a polyp which was adenocarcinoma on a sigmoidoscopy in   September 2015. On June 10, 2014, she was admitted for segmental   resection of the sigmoid colon. This revealed pT3, N1a, MX, low-grade   adenocarcinoma of the colon. The mass measuring 4 x 3 x 0.5 centimeters. It   was low-grade and there was no evidence of microsatellite instability by   histology. Margins were uninvolved and 4 lymph nodes only were removed which   only 1 was involved. This corresponding stage IIIB, T3, N1a, M0, low-grade   sigmoid cancer.   On 07/07/2014 Recommended to undergo 12 cycles of FOLFOX as adjuvant therapy.  07/27/2014 Started Adjuvant chemotherapy with FOLFOX    Interval History:  -wound is smaller and now 1 cm deep.  - feeling well and has no complaints    History   PAST MEDICAL HISTORY:  Hypertension, hyperlipidemia, obesity, asthma, colon cancer stage III, and  COPD.     PAST SURGICAL HISTORY:  Lap-banding gastric bypass, hysterectomy, C section, hernia repair, and  laparoscopy.     FAMILY HISTORY:  Father and mother with diabetes and heart attacks.     SOCIAL HISTORY:  She is a former smoker, quit 4 years ago, denies alcohol intake or illicit  drugs.     Review of Systems   Constitutional: Negative.    HENT: Negative.    Eyes: Negative.    Respiratory: Negative.    Cardiovascular: Negative.    Genitourinary: Negative.        Examination  Vitals: BP  144/79 mmHg   Pulse 71   Temp(Src) 36.4 C (97.5 F)   Ht 1.6 m (_0 )   Wt 129.729 kg (286 lb)   BMI 50.68 kg/m2   SpO2 96%  Physical Exam   Constitutional: She is oriented to person, place, and time. She appears well-developed and well-nourished.   HENT:   Head: Normocephalic.   Right Ear: External ear normal.   Eyes: Conjunctivae are normal. Pupils are equal, round, and reactive to light. Right eye exhibits no discharge.   Neck: Normal range of motion. Neck supple.   Cardiovascular: Normal rate and normal heart sounds.    Abdominal: Soft.   Neurological: She is alert and oriented to person, place, and time. No cranial nerve deficit.   Skin: Skin is warm and dry.     Ortho Exam     Lab  Cbc 7.5/11.1/173  ALbumin 3.4  cmp   Cr 0.9, AST/ALT WNL      Assessment and Plan  1. Stage IIIB, pT3, TN1a, MX, adenocarcinoma of the sigmoid low-grade status post  Resection.   - recommend 12 cycles of FOLFOX as adjuvant chemotherapy (60% risk reduction for recurrent disease)   - Regimen    -  Oxaliplatin 85 mg/m2    - bolus 5FU/LV 400 mg/m2     - Inf 5FU 2400 mg/m2 over 46 hrs    - BSA 2.4 (actual weight)   As the wound is better I will proceed with C3 w/o dose modification     2. CINV   - Prophylaxis : Aloxi and Dexamethazone for 3 days   - PRN: compazine    3. Abdominal wall abscess   - continue wound care by Home nurse   - better    No orders of the defined types were placed in this encounter.               Lyn Hollingshead, MD

## 2014-09-23 DIAGNOSIS — R0600 Dyspnea, unspecified: Secondary | ICD-10-CM | POA: Diagnosis not present

## 2014-09-23 DIAGNOSIS — R0989 Other specified symptoms and signs involving the circulatory and respiratory systems: Secondary | ICD-10-CM | POA: Diagnosis not present

## 2014-09-23 DIAGNOSIS — C189 Malignant neoplasm of colon, unspecified: Secondary | ICD-10-CM | POA: Diagnosis not present

## 2014-09-23 DIAGNOSIS — Z452 Encounter for adjustment and management of vascular access device: Secondary | ICD-10-CM | POA: Diagnosis not present

## 2014-09-23 DIAGNOSIS — R05 Cough: Secondary | ICD-10-CM | POA: Diagnosis not present

## 2014-09-23 DIAGNOSIS — R0789 Other chest pain: Secondary | ICD-10-CM | POA: Diagnosis not present

## 2014-09-23 DIAGNOSIS — Z5111 Encounter for antineoplastic chemotherapy: Secondary | ICD-10-CM | POA: Diagnosis not present

## 2014-09-27 DIAGNOSIS — Z452 Encounter for adjustment and management of vascular access device: Secondary | ICD-10-CM | POA: Diagnosis not present

## 2014-09-27 DIAGNOSIS — C189 Malignant neoplasm of colon, unspecified: Secondary | ICD-10-CM | POA: Diagnosis not present

## 2014-10-01 DIAGNOSIS — C189 Malignant neoplasm of colon, unspecified: Secondary | ICD-10-CM | POA: Diagnosis not present

## 2014-10-01 DIAGNOSIS — Z452 Encounter for adjustment and management of vascular access device: Secondary | ICD-10-CM | POA: Diagnosis not present

## 2014-10-05 ENCOUNTER — Ambulatory Visit (INDEPENDENT_AMBULATORY_CARE_PROVIDER_SITE_OTHER): Payer: Medicare Other | Admitting: HEMATOLOGY/ONCOLOGY

## 2014-10-05 ENCOUNTER — Encounter (INDEPENDENT_AMBULATORY_CARE_PROVIDER_SITE_OTHER): Payer: Self-pay | Admitting: HEMATOLOGY/ONCOLOGY

## 2014-10-05 VITALS — BP 157/77 | HR 85 | Temp 96.7°F | Resp 20 | Ht 63.0 in | Wt 287.0 lb

## 2014-10-05 DIAGNOSIS — R197 Diarrhea, unspecified: Secondary | ICD-10-CM | POA: Diagnosis not present

## 2014-10-05 DIAGNOSIS — E876 Hypokalemia: Secondary | ICD-10-CM | POA: Diagnosis not present

## 2014-10-05 DIAGNOSIS — T451X5A Adverse effect of antineoplastic and immunosuppressive drugs, initial encounter: Secondary | ICD-10-CM | POA: Diagnosis not present

## 2014-10-05 DIAGNOSIS — C187 Malignant neoplasm of sigmoid colon: Secondary | ICD-10-CM | POA: Diagnosis not present

## 2014-10-05 DIAGNOSIS — C189 Malignant neoplasm of colon, unspecified: Secondary | ICD-10-CM | POA: Diagnosis not present

## 2014-10-05 DIAGNOSIS — Z5111 Encounter for antineoplastic chemotherapy: Secondary | ICD-10-CM | POA: Diagnosis not present

## 2014-10-05 DIAGNOSIS — R112 Nausea with vomiting, unspecified: Secondary | ICD-10-CM | POA: Diagnosis not present

## 2014-10-05 MED ORDER — LOPERAMIDE 2 MG CAPSULE
4.0000 mg | ORAL_CAPSULE | ORAL | Status: DC | PRN
Start: 2014-10-05 — End: 2014-11-10

## 2014-10-05 NOTE — Progress Notes (Signed)
[facility]  [place] [address]  [phone]          Encounter Date: 10/05/2014 10:15 AM EST      Name: Chandler Stofer  Age: 64 y.o.  DOB: Apr 20, 1951  Sex: female    Chief Complaint:   Chief Complaint   Patient presents with    Chemotherapy     Treatment for colon cancer        HPI  This is a 64 year old female with a past medical history significant for   morbid obesity, hypertension, hyperlipidemia, multiple hernia repair, and   cervical cancer who was diagnosed with stage III colon cancer here for follow up.  She  was found to have a polyp which was adenocarcinoma on a sigmoidoscopy in   September 2015. On June 10, 2014, she was admitted for segmental   resection of the sigmoid colon. This revealed pT3, N1a, MX, low-grade   adenocarcinoma of the colon. The mass measuring 4 x 3 x 0.5 centimeters. It   was low-grade and there was no evidence of microsatellite instability by   histology. Margins were uninvolved and 4 lymph nodes only were removed which   only 1 was involved. This corresponding stage IIIB, T3, N1a, M0, low-grade   sigmoid cancer.   On 07/07/2014 Recommended to undergo 12 cycles of FOLFOX as adjuvant therapy.  07/27/2014 Started Adjuvant chemotherapy with FOLFOX    Interval History:  - feeling well and has no complaints but diarrhea which well controlled with imodium  - abdominal wound has healed.    History   PAST MEDICAL HISTORY:  Hypertension, hyperlipidemia, obesity, asthma, colon cancer stage III, and  COPD.     PAST SURGICAL HISTORY:  Lap-banding gastric bypass, hysterectomy, C section, hernia repair, and  laparoscopy.     FAMILY HISTORY:  Father and mother with diabetes and heart attacks.     SOCIAL HISTORY:  She is a former smoker, quit 4 years ago, denies alcohol intake or illicit  drugs.     Review of Systems   Constitutional: Negative.    HENT: Negative.    Eyes: Negative.    Respiratory: Negative.    Cardiovascular: Negative.       Genitourinary: Negative.        Examination  Vitals: BP 157/77 mmHg   Pulse 85   Temp(Src) 35.9 C (96.7 F)   Resp 20   Ht 1.6 m (_0 )   Wt 130.182 kg (287 lb)   BMI 50.85 kg/m2   SpO2 97%  Physical Exam   Constitutional: She is oriented to person, place, and time. She appears well-developed and well-nourished.   HENT:   Head: Normocephalic.   Right Ear: External ear normal.   Eyes: Conjunctivae are normal. Pupils are equal, round, and reactive to light. Right eye exhibits no discharge.   Neck: Normal range of motion. Neck supple.   Cardiovascular: Normal rate and normal heart sounds.    Abdominal: Soft. , wound at RLQ has healed well  Neurological: She is alert and oriented to person, place, and time. No cranial nerve deficit.   Skin: Skin is warm and dry. , no Rash    Lab  Cbc 4.9/9.9/115  ALbumin 3.4  cmp   Cr 0.9, AST/ALT WNL  k 2.4    Assessment and Plan  1. Stage IIIB, pT3, TN1a, MX, adenocarcinoma of the sigmoid low-grade status post  Resection.   - recommend 12 cycles of FOLFOX as  adjuvant chemotherapy (60% risk reduction for recurrent disease)   - Regimen    - Oxaliplatin 85 mg/m2 = 200 mg     - bolus 5FU/LV 400 mg/m2  Will cap BSA at 2 due to diarrhea  = 800 mg     - Inf 5FU 2400 mg/m2 over 46 hrs = 5760 mg    - BSA 2.4 (actual weight)   - Proceed with C4 with lower dose of bolus 5FU due to diarrhea       2. CINV   - Prophylaxis : Aloxi and Dexamethazone for 3 days   - PRN: compazine    3. Abdominal wall abscess   - resolved    4. Hypokalemia, new due to diarrhea from 5FU   - IV replacement today, and continue oral supplement    5. Diarrhea from 5FU, worse    - will lower 5FU/LCV to 800 mg (capped BSA at 2)   - Imodium PRN      Orders Placed This Encounter    loperamide (IMODIUM) 2 mg Oral Capsule               Lyn Hollingshead, MD

## 2014-10-06 DIAGNOSIS — I10 Essential (primary) hypertension: Secondary | ICD-10-CM | POA: Diagnosis not present

## 2014-10-06 DIAGNOSIS — R002 Palpitations: Secondary | ICD-10-CM | POA: Diagnosis not present

## 2014-10-07 DIAGNOSIS — Z452 Encounter for adjustment and management of vascular access device: Secondary | ICD-10-CM | POA: Diagnosis not present

## 2014-10-07 DIAGNOSIS — C189 Malignant neoplasm of colon, unspecified: Secondary | ICD-10-CM | POA: Diagnosis not present

## 2014-10-15 DIAGNOSIS — J309 Allergic rhinitis, unspecified: Secondary | ICD-10-CM | POA: Diagnosis not present

## 2014-10-15 DIAGNOSIS — G4700 Insomnia, unspecified: Secondary | ICD-10-CM | POA: Diagnosis not present

## 2014-10-15 DIAGNOSIS — J45909 Unspecified asthma, uncomplicated: Secondary | ICD-10-CM | POA: Diagnosis not present

## 2014-10-17 ENCOUNTER — Emergency Department (EMERGENCY_DEPARTMENT_HOSPITAL): Payer: Medicare Other

## 2014-10-17 ENCOUNTER — Emergency Department (EMERGENCY_DEPARTMENT_HOSPITAL)
Admission: EM | Admit: 2014-10-17 | Discharge: 2014-10-17 | Disposition: A | Discharge status: Home / Self Care | Payer: Medicare Other | Attending: Emergency Medicine | Admitting: Emergency Medicine

## 2014-10-17 DIAGNOSIS — N390 Urinary tract infection, site not specified: Secondary | ICD-10-CM | POA: Diagnosis not present

## 2014-10-17 DIAGNOSIS — K439 Ventral hernia without obstruction or gangrene: Secondary | ICD-10-CM | POA: Diagnosis not present

## 2014-10-17 DIAGNOSIS — Z9884 Bariatric surgery status: Secondary | ICD-10-CM | POA: Diagnosis not present

## 2014-10-17 DIAGNOSIS — R109 Unspecified abdominal pain: Secondary | ICD-10-CM | POA: Diagnosis not present

## 2014-10-17 DIAGNOSIS — E876 Hypokalemia: Secondary | ICD-10-CM | POA: Diagnosis not present

## 2014-10-17 DIAGNOSIS — R188 Other ascites: Secondary | ICD-10-CM | POA: Diagnosis not present

## 2014-10-17 DIAGNOSIS — Z85038 Personal history of other malignant neoplasm of large intestine: Secondary | ICD-10-CM | POA: Diagnosis not present

## 2014-10-19 ENCOUNTER — Ambulatory Visit (INDEPENDENT_AMBULATORY_CARE_PROVIDER_SITE_OTHER): Payer: Medicare Other | Admitting: HEMATOLOGY/ONCOLOGY

## 2014-10-19 ENCOUNTER — Encounter (INDEPENDENT_AMBULATORY_CARE_PROVIDER_SITE_OTHER): Payer: Self-pay | Admitting: HEMATOLOGY/ONCOLOGY

## 2014-10-19 VITALS — BP 101/52 | HR 61 | Temp 97.7°F | Ht 63.0 in | Wt 281.6 lb

## 2014-10-19 DIAGNOSIS — E612 Magnesium deficiency: Secondary | ICD-10-CM | POA: Diagnosis not present

## 2014-10-19 DIAGNOSIS — E860 Dehydration: Secondary | ICD-10-CM | POA: Diagnosis not present

## 2014-10-19 DIAGNOSIS — C187 Malignant neoplasm of sigmoid colon: Secondary | ICD-10-CM | POA: Diagnosis not present

## 2014-10-19 DIAGNOSIS — T451X5A Adverse effect of antineoplastic and immunosuppressive drugs, initial encounter: Secondary | ICD-10-CM | POA: Diagnosis not present

## 2014-10-19 DIAGNOSIS — Z5111 Encounter for antineoplastic chemotherapy: Secondary | ICD-10-CM | POA: Diagnosis not present

## 2014-10-19 DIAGNOSIS — C189 Malignant neoplasm of colon, unspecified: Secondary | ICD-10-CM | POA: Diagnosis not present

## 2014-10-19 DIAGNOSIS — E876 Hypokalemia: Secondary | ICD-10-CM | POA: Diagnosis not present

## 2014-10-19 DIAGNOSIS — R112 Nausea with vomiting, unspecified: Secondary | ICD-10-CM | POA: Diagnosis not present

## 2014-10-19 NOTE — Progress Notes (Signed)
[facility]  [place] [address]  [phone]          Encounter Date: 10/19/2014  8:45 AM EST      Name: Kelsey [name]  Age: 64 y.o.  DOB: 1951/03/20  Sex: female    Chief Complaint:   Chief Complaint   Patient presents with    Chemotherapy     Treatment Cycle 5        HPI  This is a 64 year old female with a past medical history significant for   morbid obesity, hypertension, hyperlipidemia, multiple hernia repair, and   cervical cancer who was diagnosed with stage III colon cancer here for follow up.  She  was found to have a polyp which was adenocarcinoma on a sigmoidoscopy in   September 2015. On June 10, 2014, she was admitted for segmental   resection of the sigmoid colon. This revealed pT3, N1a, MX, low-grade   adenocarcinoma of the colon. The mass measuring 4 x 3 x 0.5 centimeters. It   was low-grade and there was no evidence of microsatellite instability by   histology. Margins were uninvolved and 4 lymph nodes only were removed which   only 1 was involved. This corresponding stage IIIB, T3, N1a, M0, low-grade   sigmoid cancer.   On 07/07/2014 Recommended to undergo 12 cycles of FOLFOX as adjuvant therapy.  07/27/2014 Started Adjuvant chemotherapy with FOLFOX    Interval History:  -  Has not been feeling weak, continue to have diarreha  - was seen at ER and was found to have UTI  - feeling very nausated today    History   PAST MEDICAL HISTORY:  Hypertension, hyperlipidemia, obesity, asthma, colon cancer stage III, and  COPD.     PAST SURGICAL HISTORY:  Lap-banding gastric bypass, hysterectomy, C section, hernia repair, and  laparoscopy.     FAMILY HISTORY:  Father and mother with diabetes and heart attacks.     SOCIAL HISTORY:  She is a former smoker, quit 4 years ago, denies alcohol intake or illicit  drugs.     Review of Systems   Constitutional: Negative.    HENT: Negative.    Eyes: Negative.    Respiratory: Negative.    Cardiovascular: Negative.       Genitourinary: Negative.        Examination  Vitals: BP 101/52 mmHg   Pulse 61   Temp(Src) 36.5 C (97.7 F)   Ht 1.6 m (5' 3" )   Wt 127.733 kg (281 lb 9.6 oz)   BMI 49.90 kg/m2   SpO2 97%  Physical Exam   Constitutional: She is oriented to person, place, and time. She appears well-developed and well-nourished.   HENT:   Head: Normocephalic.   Right Ear: External ear normal.   Eyes: Conjunctivae are normal. Pupils are equal, round, and reactive to light. Right eye exhibits no discharge.   Neck: Normal range of motion. Neck supple.   Cardiovascular: Normal rate and normal heart sounds.    Abdominal: Soft. , wound at RLQ has healed well  Neurological: She is alert and oriented to person, place, and time. No cranial nerve deficit.   Skin: Skin is warm and dry. , no Rash    Lab  Cbc 4.9/9.9/115  ALbumin 3.4  cmp   Cr 0.9, AST/ALT WNL  k 2.4, Mg 1.4    Assessment and Plan  1. Stage IIIB, pT3, TN1a, MX, adenocarcinoma of the sigmoid low-grade status post  Resection.   - recommend 12 cycles of FOLFOX as adjuvant chemotherapy (60% risk reduction for recurrent disease)   - Regimen    - Oxaliplatin 85 mg/m2 = 200 mg     - bolus 5FU/LV 400 mg/m2  Will cap BSA at 2 due to diarrhea  = 800 mg     - Inf 5FU 2400 mg/m2 over 46 hrs = 5760 mg    - BSA 2.4 (actual weight)   - delay C5 due to severe weakness and decline in PS       2. CINV   - Prophylaxis : Aloxi and Dexamethazone for 3 days   - PRN: compazine      4. Hypokalemia, new due to diarrhea from 5FU   - IV replacement today, and continue oral supplement    5. Severe hypomag   - iv replacement and will start MgOxide 400 mg    No orders of the defined types were placed in this encounter.               Lyn Hollingshead, MD

## 2014-10-22 ENCOUNTER — Emergency Department (EMERGENCY_DEPARTMENT_HOSPITAL): Payer: Medicare Other

## 2014-10-22 ENCOUNTER — Emergency Department (EMERGENCY_DEPARTMENT_HOSPITAL): Admission: EM | Admit: 2014-10-22 | Discharge: 2014-10-22 | Payer: Medicare Other

## 2014-10-22 DIAGNOSIS — Z9049 Acquired absence of other specified parts of digestive tract: Secondary | ICD-10-CM | POA: Diagnosis not present

## 2014-10-22 DIAGNOSIS — I482 Chronic atrial fibrillation: Secondary | ICD-10-CM | POA: Diagnosis not present

## 2014-10-22 DIAGNOSIS — R109 Unspecified abdominal pain: Secondary | ICD-10-CM | POA: Diagnosis not present

## 2014-10-22 DIAGNOSIS — Z01810 Encounter for preprocedural cardiovascular examination: Secondary | ICD-10-CM | POA: Diagnosis not present

## 2014-10-22 DIAGNOSIS — K439 Ventral hernia without obstruction or gangrene: Secondary | ICD-10-CM | POA: Diagnosis not present

## 2014-10-22 DIAGNOSIS — I4891 Unspecified atrial fibrillation: Secondary | ICD-10-CM | POA: Diagnosis not present

## 2014-10-22 DIAGNOSIS — K529 Noninfective gastroenteritis and colitis, unspecified: Secondary | ICD-10-CM | POA: Diagnosis present

## 2014-10-22 DIAGNOSIS — Z6841 Body Mass Index (BMI) 40.0 and over, adult: Secondary | ICD-10-CM | POA: Diagnosis not present

## 2014-10-22 DIAGNOSIS — R079 Chest pain, unspecified: Secondary | ICD-10-CM | POA: Diagnosis not present

## 2014-10-22 DIAGNOSIS — C189 Malignant neoplasm of colon, unspecified: Secondary | ICD-10-CM | POA: Diagnosis not present

## 2014-10-22 DIAGNOSIS — E780 Pure hypercholesterolemia: Secondary | ICD-10-CM | POA: Diagnosis present

## 2014-10-22 DIAGNOSIS — L02211 Cutaneous abscess of abdominal wall: Secondary | ICD-10-CM | POA: Diagnosis not present

## 2014-10-22 DIAGNOSIS — Z452 Encounter for adjustment and management of vascular access device: Secondary | ICD-10-CM | POA: Diagnosis not present

## 2014-10-22 DIAGNOSIS — R1031 Right lower quadrant pain: Secondary | ICD-10-CM | POA: Diagnosis not present

## 2014-10-22 DIAGNOSIS — R748 Abnormal levels of other serum enzymes: Secondary | ICD-10-CM | POA: Diagnosis not present

## 2014-10-22 DIAGNOSIS — B372 Candidiasis of skin and nail: Secondary | ICD-10-CM | POA: Diagnosis present

## 2014-10-22 DIAGNOSIS — Z87891 Personal history of nicotine dependence: Secondary | ICD-10-CM | POA: Diagnosis not present

## 2014-10-22 DIAGNOSIS — I480 Paroxysmal atrial fibrillation: Secondary | ICD-10-CM | POA: Diagnosis not present

## 2014-10-22 DIAGNOSIS — Z9884 Bariatric surgery status: Secondary | ICD-10-CM | POA: Diagnosis not present

## 2014-10-22 DIAGNOSIS — R160 Hepatomegaly, not elsewhere classified: Secondary | ICD-10-CM | POA: Diagnosis not present

## 2014-10-22 DIAGNOSIS — K436 Other and unspecified ventral hernia with obstruction, without gangrene: Secondary | ICD-10-CM | POA: Diagnosis not present

## 2014-10-22 DIAGNOSIS — E785 Hyperlipidemia, unspecified: Secondary | ICD-10-CM | POA: Diagnosis not present

## 2014-10-22 DIAGNOSIS — R188 Other ascites: Secondary | ICD-10-CM | POA: Diagnosis not present

## 2014-10-22 DIAGNOSIS — Z8541 Personal history of malignant neoplasm of cervix uteri: Secondary | ICD-10-CM | POA: Diagnosis not present

## 2014-10-22 DIAGNOSIS — L03311 Cellulitis of abdominal wall: Secondary | ICD-10-CM | POA: Diagnosis not present

## 2014-10-22 DIAGNOSIS — J45909 Unspecified asthma, uncomplicated: Secondary | ICD-10-CM | POA: Diagnosis present

## 2014-10-22 DIAGNOSIS — I10 Essential (primary) hypertension: Secondary | ICD-10-CM | POA: Diagnosis not present

## 2014-10-22 DIAGNOSIS — T451X5A Adverse effect of antineoplastic and immunosuppressive drugs, initial encounter: Secondary | ICD-10-CM | POA: Diagnosis present

## 2014-10-22 DIAGNOSIS — R509 Fever, unspecified: Secondary | ICD-10-CM | POA: Diagnosis not present

## 2014-10-26 ENCOUNTER — Encounter (INDEPENDENT_AMBULATORY_CARE_PROVIDER_SITE_OTHER): Payer: Medicare Other | Admitting: HEMATOLOGY/ONCOLOGY

## 2014-10-29 DIAGNOSIS — C189 Malignant neoplasm of colon, unspecified: Secondary | ICD-10-CM | POA: Diagnosis not present

## 2014-10-29 DIAGNOSIS — Z452 Encounter for adjustment and management of vascular access device: Secondary | ICD-10-CM | POA: Diagnosis not present

## 2014-11-01 ENCOUNTER — Emergency Department (EMERGENCY_DEPARTMENT_HOSPITAL): Admission: EM | Admit: 2014-11-01 | Discharge: 2014-11-01 | Payer: Medicare Other

## 2014-11-01 ENCOUNTER — Emergency Department (EMERGENCY_DEPARTMENT_HOSPITAL): Payer: Medicare Other

## 2014-11-01 DIAGNOSIS — I4891 Unspecified atrial fibrillation: Secondary | ICD-10-CM | POA: Diagnosis not present

## 2014-11-01 DIAGNOSIS — I10 Essential (primary) hypertension: Secondary | ICD-10-CM | POA: Diagnosis not present

## 2014-11-01 DIAGNOSIS — Z6841 Body Mass Index (BMI) 40.0 and over, adult: Secondary | ICD-10-CM | POA: Diagnosis not present

## 2014-11-01 DIAGNOSIS — Z9884 Bariatric surgery status: Secondary | ICD-10-CM | POA: Diagnosis not present

## 2014-11-01 DIAGNOSIS — Z1621 Resistance to vancomycin: Secondary | ICD-10-CM | POA: Diagnosis present

## 2014-11-01 DIAGNOSIS — T8183XA Persistent postprocedural fistula, initial encounter: Secondary | ICD-10-CM | POA: Diagnosis not present

## 2014-11-01 DIAGNOSIS — Z9049 Acquired absence of other specified parts of digestive tract: Secondary | ICD-10-CM | POA: Diagnosis present

## 2014-11-01 DIAGNOSIS — Z452 Encounter for adjustment and management of vascular access device: Secondary | ICD-10-CM | POA: Diagnosis not present

## 2014-11-01 DIAGNOSIS — C189 Malignant neoplasm of colon, unspecified: Secondary | ICD-10-CM | POA: Diagnosis not present

## 2014-11-01 DIAGNOSIS — Z9221 Personal history of antineoplastic chemotherapy: Secondary | ICD-10-CM | POA: Diagnosis not present

## 2014-11-01 DIAGNOSIS — R0602 Shortness of breath: Secondary | ICD-10-CM | POA: Diagnosis not present

## 2014-11-01 DIAGNOSIS — L02211 Cutaneous abscess of abdominal wall: Secondary | ICD-10-CM | POA: Diagnosis not present

## 2014-11-01 DIAGNOSIS — Z87891 Personal history of nicotine dependence: Secondary | ICD-10-CM | POA: Diagnosis not present

## 2014-11-01 DIAGNOSIS — E876 Hypokalemia: Secondary | ICD-10-CM | POA: Diagnosis present

## 2014-11-01 DIAGNOSIS — E785 Hyperlipidemia, unspecified: Secondary | ICD-10-CM | POA: Diagnosis not present

## 2014-11-01 DIAGNOSIS — L988 Other specified disorders of the skin and subcutaneous tissue: Secondary | ICD-10-CM | POA: Diagnosis not present

## 2014-11-01 DIAGNOSIS — Z7982 Long term (current) use of aspirin: Secondary | ICD-10-CM | POA: Diagnosis not present

## 2014-11-01 DIAGNOSIS — R188 Other ascites: Secondary | ICD-10-CM | POA: Diagnosis not present

## 2014-11-01 DIAGNOSIS — K632 Fistula of intestine: Secondary | ICD-10-CM | POA: Diagnosis not present

## 2014-11-01 DIAGNOSIS — Z8541 Personal history of malignant neoplasm of cervix uteri: Secondary | ICD-10-CM | POA: Diagnosis not present

## 2014-11-01 DIAGNOSIS — R197 Diarrhea, unspecified: Secondary | ICD-10-CM | POA: Diagnosis not present

## 2014-11-03 DIAGNOSIS — C189 Malignant neoplasm of colon, unspecified: Secondary | ICD-10-CM | POA: Diagnosis not present

## 2014-11-03 DIAGNOSIS — Z452 Encounter for adjustment and management of vascular access device: Secondary | ICD-10-CM | POA: Diagnosis not present

## 2014-11-05 DIAGNOSIS — C189 Malignant neoplasm of colon, unspecified: Secondary | ICD-10-CM | POA: Diagnosis not present

## 2014-11-05 DIAGNOSIS — Z452 Encounter for adjustment and management of vascular access device: Secondary | ICD-10-CM | POA: Diagnosis not present

## 2014-11-08 DIAGNOSIS — Z452 Encounter for adjustment and management of vascular access device: Secondary | ICD-10-CM | POA: Diagnosis not present

## 2014-11-08 DIAGNOSIS — C189 Malignant neoplasm of colon, unspecified: Secondary | ICD-10-CM | POA: Diagnosis not present

## 2014-11-09 ENCOUNTER — Encounter (INDEPENDENT_AMBULATORY_CARE_PROVIDER_SITE_OTHER): Payer: Medicare Other | Admitting: HEMATOLOGY/ONCOLOGY

## 2014-11-10 ENCOUNTER — Encounter (INDEPENDENT_AMBULATORY_CARE_PROVIDER_SITE_OTHER): Payer: Self-pay | Admitting: HEMATOLOGY/ONCOLOGY

## 2014-11-10 ENCOUNTER — Encounter (INDEPENDENT_AMBULATORY_CARE_PROVIDER_SITE_OTHER): Payer: Self-pay

## 2014-11-10 ENCOUNTER — Ambulatory Visit (INDEPENDENT_AMBULATORY_CARE_PROVIDER_SITE_OTHER): Payer: Medicare Other | Admitting: HEMATOLOGY/ONCOLOGY

## 2014-11-10 VITALS — BP 127/67 | HR 69 | Temp 97.3°F | Ht 63.0 in | Wt 276.0 lb

## 2014-11-10 DIAGNOSIS — C187 Malignant neoplasm of sigmoid colon: Secondary | ICD-10-CM | POA: Diagnosis not present

## 2014-11-10 DIAGNOSIS — C189 Malignant neoplasm of colon, unspecified: Secondary | ICD-10-CM | POA: Diagnosis not present

## 2014-11-10 DIAGNOSIS — Z452 Encounter for adjustment and management of vascular access device: Secondary | ICD-10-CM | POA: Diagnosis not present

## 2014-11-10 LAB — CBC/DIFF
BASOPHILS: 1.3 %
BASOS ABS: 0.1 10 (ref 0.00–0.20)
BASOS ABS: 0.1 10 (ref 0.00–0.20)
EOS ABS: 0.1 10 (ref 0.0–0.5)
EOSINOPHIL: 2.5 %
HCT: 32.8 % — ABNORMAL LOW (ref 34.6–46.2)
HGB: 10.4 gm/dL — ABNORMAL LOW (ref 11.8–15.8)
LYMPHOCYTES: 35.7 %
LYMPHS ABS: 1.9 10 (ref 0.9–3.4)
LYMPHS ABS: 1.9 10 (ref 0.9–3.4)
MCH: 26.5 pg — ABNORMAL LOW (ref 27.6–33.2)
MCHC: 31.8 gm/dL — ABNORMAL LOW (ref 32.6–35.4)
MCV: 83.5 fl (ref 82.3–96.7)
MONOCYTES: 12.5 %
MONOS ABS: 0.7 10 (ref 0.2–0.9)
MPV: 8.2 fl (ref 6.6–10.2)
PLATELET COUNT (AUTO): 260 10 (ref 140–440)
PMN ABS (AUTO): 2.5 10 (ref 1.5–6.4)
PMN'S: 48 %
RBC: 3.93 10 (ref 3.80–5.24)
RDW: 25.4 % — ABNORMAL HIGH (ref 12.4–15.2)
RDW: 25.4 % — ABNORMAL HIGH (ref 12.4–15.2)
WBC: 5.3 10 (ref 3.5–10.3)

## 2014-11-10 LAB — MAGNESIUM: MAGNESIUM: 1.7 mg/dL — ABNORMAL LOW (ref 1.8–2.5)

## 2014-11-10 LAB — COMPREHENSIVE METABOLIC PANEL, NON-FASTING
ALBUMIN: 2.8 g/dL — ABNORMAL LOW (ref 3.5–4.8)
ALKALINE PHOSPHATASE: 82 U/L (ref 20–130)
ALT (SGPT): 12 U/L (ref 4–36)
AST (SGOT): 24 U/L (ref 8–33)
BILIRUBIN, TOTAL: 0.7 mg/dL (ref 0.3–1.2)
BUN: 2 mg/dL — ABNORMAL LOW (ref 8–20)
CALCIUM: 8.3 mg/dL — ABNORMAL LOW (ref 8.9–10.3)
CARBON DIOXIDE: 24 meq/L (ref 22–32)
CHLORIDE: 106 meq/L (ref 101–111)
CREATININE: 0.8 mg/dL (ref 0.6–1.2)
ESTIMATED GLOMERULAR FILTRATION RATE: >60 — AB
GLUCOSE,NONFAST: 110 mg/dL (ref 70–110)
POTASSIUM: 3.7 mEq/L (ref 3.6–5.1)
SODIUM: 139 mEq/L (ref 136–144)
TOTAL PROTEIN: 5.2 g/dL — ABNORMAL LOW (ref 6.4–8.3)

## 2014-11-10 LAB — CARCINOEMBRYONIC ANTIGEN: CARCINOEMBRYONIC AG: 0.9 ng/mL

## 2014-11-10 MED ORDER — LOPERAMIDE 2 MG CAPSULE
4.0000 mg | ORAL_CAPSULE | ORAL | Status: DC | PRN
Start: 2014-11-10 — End: 2015-04-19

## 2014-11-11 NOTE — Progress Notes (Signed)
[facility]  [place] [address]  [phone]          Encounter Date: 11/10/2014  2:00 PM EDT      Name: Kelsey [name]  Age: 64 y.o.  DOB: [birth year]  Sex: female    Chief Complaint:   Chief Complaint   Patient presents with    Colon Cancer     follow up        HPI  This is a 64 year old female with a past medical history significant for   morbid obesity, hypertension, hyperlipidemia, multiple hernia repair, and   cervical cancer who was diagnosed with stage III colon cancer here for follow up.  She  was found to have a polyp which was adenocarcinoma on a sigmoidoscopy in   September 2015. On June 10, 2014, she was admitted for segmental   resection of the sigmoid colon. This revealed pT3, N1a, MX, low-grade   adenocarcinoma of the colon. The mass measuring 4 x 3 x 0.5 centimeters. It   was low-grade and there was no evidence of microsatellite instability by   histology. Margins were uninvolved and 4 lymph nodes only were removed which   only 1 was involved. This corresponding stage IIIB, T3, N1a, M0, low-grade   sigmoid cancer.   On 07/07/2014 Recommended to undergo 12 cycles of FOLFOX as adjuvant therapy.  07/27/2014 Started Adjuvant chemotherapy with FOLFOX    Interval History:  - Was admitted again due to abdominal wall abscess,   -Now with wound VAC  - complaining of severe weakness       History   PAST MEDICAL HISTORY:  Hypertension, hyperlipidemia, obesity, asthma, colon cancer stage III, and  COPD.     PAST SURGICAL HISTORY:  Lap-banding gastric bypass, hysterectomy, C section, hernia repair, and  laparoscopy.     FAMILY HISTORY:  Father and mother with diabetes and heart attacks.     SOCIAL HISTORY:  She is a former smoker, quit 4 years ago, denies alcohol intake or illicit  drugs.     Review of Systems   Constitutional: Negative.    HENT: Negative.    Eyes: Negative.    Respiratory: Negative.    Cardiovascular: Negative.    Genitourinary: Negative.               Examination  Vitals: BP 127/67 mmHg   Pulse 69   Temp(Src) 36.3 C (97.3 F)   Ht 1.6 m (5' 3" )   Wt 125.193 kg (276 lb)   BMI 48.90 kg/m2   SpO2 98%  Physical Exam   Constitutional: She is oriented to person, place, and time. She appears well-developed and well-nourished.   HENT:   Head: Normocephalic.   Right Ear: External ear normal.   Eyes: Conjunctivae are normal. Pupils are equal, round, and reactive to light. Right eye exhibits no discharge.   Neck: Normal range of motion. Neck supple.   Cardiovascular: Normal rate and normal heart sounds.    Abdominal: Soft. , wound at RLQ has healed well  Neurological: She is alert and oriented to person, place, and time. No cranial nerve deficit.   Skin: Skin is warm and dry. , no Rash    Lab  Cbc 4.9/9.9/115  ALbumin 3.4  cmp   Cr 0.9, AST/ALT WNL  k 2.4, Mg 1.4    Assessment and Plan  1. Stage IIIB, pT3, TN1a, MX, adenocarcinoma of the sigmoid low-grade status post  Resection.   -  recommended  12 cycles of FOLFOX as adjuvant chemotherapy (60% risk reduction for recurrent disease)   - Regimen    - Oxaliplatin 85 mg/m2 = 200 mg     - bolus 5FU/LV 400 mg/m2  Will cap BSA at 2 due to diarrhea  = 800 mg     - Inf 5FU 2400 mg/m2 over 46 hrs = 5760 mg    - BSA 2.4 (actual weight)   - only received 5 treatment due to poor tolerance to chemotherapy and recurrent infection pt does not want more treatment   - will switch to observation and will see her in 3 months with CEA, ct scan in 6 months (04/2015)       Orders Placed This Encounter    CBC/DIFF    CARCINOEMBRYONIC ANTIGEN    COMPREHENSIVE METABOLIC PANEL, NON-FASTING    MAGNESIUM    CBC W/AUTO DIFF    CHEM PROFILE 14-CMP NON-FASTING    CEA    loperamide (IMODIUM) 2 mg Oral Capsule       Return in about 3 months (around 02/15/2015).      Lyn Hollingshead, MD

## 2014-11-12 DIAGNOSIS — C189 Malignant neoplasm of colon, unspecified: Secondary | ICD-10-CM | POA: Diagnosis not present

## 2014-11-12 DIAGNOSIS — Z452 Encounter for adjustment and management of vascular access device: Secondary | ICD-10-CM | POA: Diagnosis not present

## 2014-11-15 DIAGNOSIS — Z452 Encounter for adjustment and management of vascular access device: Secondary | ICD-10-CM | POA: Diagnosis not present

## 2014-11-15 DIAGNOSIS — C189 Malignant neoplasm of colon, unspecified: Secondary | ICD-10-CM | POA: Diagnosis not present

## 2014-11-17 DIAGNOSIS — Z452 Encounter for adjustment and management of vascular access device: Secondary | ICD-10-CM | POA: Diagnosis not present

## 2014-11-17 DIAGNOSIS — C189 Malignant neoplasm of colon, unspecified: Secondary | ICD-10-CM | POA: Diagnosis not present

## 2014-11-19 DIAGNOSIS — C189 Malignant neoplasm of colon, unspecified: Secondary | ICD-10-CM | POA: Diagnosis not present

## 2014-11-19 DIAGNOSIS — Z452 Encounter for adjustment and management of vascular access device: Secondary | ICD-10-CM | POA: Diagnosis not present

## 2014-11-22 ENCOUNTER — Telehealth (INDEPENDENT_AMBULATORY_CARE_PROVIDER_SITE_OTHER): Payer: Self-pay | Admitting: HEMATOLOGY/ONCOLOGY

## 2014-11-22 DIAGNOSIS — C189 Malignant neoplasm of colon, unspecified: Secondary | ICD-10-CM | POA: Diagnosis not present

## 2014-11-22 DIAGNOSIS — Z452 Encounter for adjustment and management of vascular access device: Secondary | ICD-10-CM | POA: Diagnosis not present

## 2014-11-22 MED ORDER — ALTEPLASE 2 MG INTRA-CATHETER SOLUTION
2.0000 mg | Freq: Once | Status: AC
Start: 2014-11-22 — End: 2014-11-22

## 2014-11-22 NOTE — Telephone Encounter (Signed)
Verdis Frederickson from [facility] called stating that they are having a difficult time with pts port and are requesting an order for Activase?  Please call

## 2014-11-24 DIAGNOSIS — Z452 Encounter for adjustment and management of vascular access device: Secondary | ICD-10-CM | POA: Diagnosis not present

## 2014-11-24 DIAGNOSIS — C189 Malignant neoplasm of colon, unspecified: Secondary | ICD-10-CM | POA: Diagnosis not present

## 2014-11-25 DIAGNOSIS — Z452 Encounter for adjustment and management of vascular access device: Secondary | ICD-10-CM | POA: Diagnosis not present

## 2014-11-26 ENCOUNTER — Emergency Department (EMERGENCY_DEPARTMENT_HOSPITAL): Admission: EM | Admit: 2014-11-26 | Discharge: 2014-11-26 | Payer: Medicare Other

## 2014-11-26 ENCOUNTER — Emergency Department (EMERGENCY_DEPARTMENT_HOSPITAL): Payer: Medicare Other

## 2014-11-26 DIAGNOSIS — K5792 Diverticulitis of intestine, part unspecified, without perforation or abscess without bleeding: Secondary | ICD-10-CM | POA: Diagnosis not present

## 2014-11-26 DIAGNOSIS — C189 Malignant neoplasm of colon, unspecified: Secondary | ICD-10-CM | POA: Diagnosis not present

## 2014-11-26 DIAGNOSIS — I4891 Unspecified atrial fibrillation: Secondary | ICD-10-CM | POA: Diagnosis not present

## 2014-11-26 DIAGNOSIS — L02211 Cutaneous abscess of abdominal wall: Secondary | ICD-10-CM | POA: Diagnosis not present

## 2014-11-26 DIAGNOSIS — R109 Unspecified abdominal pain: Secondary | ICD-10-CM | POA: Diagnosis not present

## 2014-11-26 DIAGNOSIS — Z4800 Encounter for change or removal of nonsurgical wound dressing: Secondary | ICD-10-CM | POA: Diagnosis not present

## 2014-11-26 DIAGNOSIS — I10 Essential (primary) hypertension: Secondary | ICD-10-CM | POA: Diagnosis not present

## 2014-11-26 DIAGNOSIS — R1032 Left lower quadrant pain: Secondary | ICD-10-CM | POA: Diagnosis not present

## 2014-11-26 DIAGNOSIS — K760 Fatty (change of) liver, not elsewhere classified: Secondary | ICD-10-CM | POA: Diagnosis not present

## 2014-11-29 DIAGNOSIS — C189 Malignant neoplasm of colon, unspecified: Secondary | ICD-10-CM | POA: Diagnosis not present

## 2014-11-29 DIAGNOSIS — L02211 Cutaneous abscess of abdominal wall: Secondary | ICD-10-CM | POA: Diagnosis not present

## 2014-11-29 DIAGNOSIS — I10 Essential (primary) hypertension: Secondary | ICD-10-CM | POA: Diagnosis not present

## 2014-11-29 DIAGNOSIS — I4891 Unspecified atrial fibrillation: Secondary | ICD-10-CM | POA: Diagnosis not present

## 2014-11-29 DIAGNOSIS — Z4800 Encounter for change or removal of nonsurgical wound dressing: Secondary | ICD-10-CM | POA: Diagnosis not present

## 2014-11-30 DIAGNOSIS — K5792 Diverticulitis of intestine, part unspecified, without perforation or abscess without bleeding: Secondary | ICD-10-CM | POA: Diagnosis not present

## 2014-12-01 DIAGNOSIS — R109 Unspecified abdominal pain: Secondary | ICD-10-CM | POA: Diagnosis not present

## 2014-12-01 DIAGNOSIS — R1084 Generalized abdominal pain: Secondary | ICD-10-CM | POA: Diagnosis not present

## 2014-12-01 DIAGNOSIS — K5900 Constipation, unspecified: Secondary | ICD-10-CM | POA: Diagnosis not present

## 2014-12-02 DIAGNOSIS — I10 Essential (primary) hypertension: Secondary | ICD-10-CM | POA: Diagnosis not present

## 2014-12-02 DIAGNOSIS — Z4800 Encounter for change or removal of nonsurgical wound dressing: Secondary | ICD-10-CM | POA: Diagnosis not present

## 2014-12-02 DIAGNOSIS — C189 Malignant neoplasm of colon, unspecified: Secondary | ICD-10-CM | POA: Diagnosis not present

## 2014-12-02 DIAGNOSIS — I4891 Unspecified atrial fibrillation: Secondary | ICD-10-CM | POA: Diagnosis not present

## 2014-12-02 DIAGNOSIS — L02211 Cutaneous abscess of abdominal wall: Secondary | ICD-10-CM | POA: Diagnosis not present

## 2014-12-16 ENCOUNTER — Emergency Department (EMERGENCY_DEPARTMENT_HOSPITAL)
Admission: EM | Admit: 2014-12-16 | Discharge: 2014-12-17 | Payer: Medicare Other | Attending: Emergency Medicine | Admitting: Emergency Medicine

## 2014-12-16 ENCOUNTER — Emergency Department (EMERGENCY_DEPARTMENT_HOSPITAL): Payer: Medicare Other

## 2014-12-16 DIAGNOSIS — R109 Unspecified abdominal pain: Secondary | ICD-10-CM | POA: Diagnosis not present

## 2014-12-16 DIAGNOSIS — C189 Malignant neoplasm of colon, unspecified: Secondary | ICD-10-CM | POA: Diagnosis not present

## 2014-12-16 DIAGNOSIS — E876 Hypokalemia: Secondary | ICD-10-CM | POA: Diagnosis not present

## 2014-12-16 DIAGNOSIS — R112 Nausea with vomiting, unspecified: Secondary | ICD-10-CM | POA: Diagnosis not present

## 2014-12-17 DIAGNOSIS — Z6841 Body Mass Index (BMI) 40.0 and over, adult: Secondary | ICD-10-CM | POA: Diagnosis not present

## 2014-12-17 DIAGNOSIS — Z9221 Personal history of antineoplastic chemotherapy: Secondary | ICD-10-CM | POA: Diagnosis not present

## 2014-12-17 DIAGNOSIS — E876 Hypokalemia: Secondary | ICD-10-CM | POA: Diagnosis not present

## 2014-12-17 DIAGNOSIS — I4891 Unspecified atrial fibrillation: Secondary | ICD-10-CM | POA: Diagnosis not present

## 2014-12-17 DIAGNOSIS — E538 Deficiency of other specified B group vitamins: Secondary | ICD-10-CM | POA: Diagnosis present

## 2014-12-17 DIAGNOSIS — R109 Unspecified abdominal pain: Secondary | ICD-10-CM | POA: Diagnosis not present

## 2014-12-17 DIAGNOSIS — Z85038 Personal history of other malignant neoplasm of large intestine: Secondary | ICD-10-CM | POA: Diagnosis not present

## 2014-12-17 DIAGNOSIS — C189 Malignant neoplasm of colon, unspecified: Secondary | ICD-10-CM | POA: Diagnosis present

## 2014-12-17 DIAGNOSIS — I10 Essential (primary) hypertension: Secondary | ICD-10-CM | POA: Diagnosis not present

## 2014-12-17 DIAGNOSIS — K5900 Constipation, unspecified: Secondary | ICD-10-CM | POA: Diagnosis not present

## 2014-12-17 DIAGNOSIS — R112 Nausea with vomiting, unspecified: Secondary | ICD-10-CM | POA: Diagnosis not present

## 2014-12-17 DIAGNOSIS — E669 Obesity, unspecified: Secondary | ICD-10-CM | POA: Diagnosis not present

## 2014-12-17 DIAGNOSIS — Z9884 Bariatric surgery status: Secondary | ICD-10-CM | POA: Diagnosis not present

## 2014-12-17 DIAGNOSIS — Z9049 Acquired absence of other specified parts of digestive tract: Secondary | ICD-10-CM | POA: Diagnosis not present

## 2014-12-29 DIAGNOSIS — D649 Anemia, unspecified: Secondary | ICD-10-CM | POA: Diagnosis not present

## 2014-12-29 DIAGNOSIS — E876 Hypokalemia: Secondary | ICD-10-CM | POA: Diagnosis not present

## 2014-12-29 DIAGNOSIS — E538 Deficiency of other specified B group vitamins: Secondary | ICD-10-CM | POA: Diagnosis not present

## 2015-01-04 ENCOUNTER — Encounter (INDEPENDENT_AMBULATORY_CARE_PROVIDER_SITE_OTHER): Payer: Medicare Other | Admitting: Surgery

## 2015-01-04 DIAGNOSIS — C189 Malignant neoplasm of colon, unspecified: Secondary | ICD-10-CM | POA: Diagnosis not present

## 2015-01-04 DIAGNOSIS — Z85038 Personal history of other malignant neoplasm of large intestine: Secondary | ICD-10-CM | POA: Diagnosis not present

## 2015-01-05 DIAGNOSIS — E538 Deficiency of other specified B group vitamins: Secondary | ICD-10-CM | POA: Diagnosis not present

## 2015-01-13 DIAGNOSIS — E538 Deficiency of other specified B group vitamins: Secondary | ICD-10-CM | POA: Diagnosis not present

## 2015-01-19 DIAGNOSIS — Z452 Encounter for adjustment and management of vascular access device: Secondary | ICD-10-CM | POA: Diagnosis not present

## 2015-01-26 DIAGNOSIS — Z01812 Encounter for preprocedural laboratory examination: Secondary | ICD-10-CM | POA: Diagnosis not present

## 2015-01-26 DIAGNOSIS — I10 Essential (primary) hypertension: Secondary | ICD-10-CM | POA: Diagnosis not present

## 2015-01-26 DIAGNOSIS — Z85038 Personal history of other malignant neoplasm of large intestine: Secondary | ICD-10-CM | POA: Diagnosis not present

## 2015-01-26 DIAGNOSIS — Z01818 Encounter for other preprocedural examination: Secondary | ICD-10-CM | POA: Diagnosis not present

## 2015-02-03 DIAGNOSIS — Z8679 Personal history of other diseases of the circulatory system: Secondary | ICD-10-CM | POA: Diagnosis not present

## 2015-02-03 DIAGNOSIS — Z85038 Personal history of other malignant neoplasm of large intestine: Secondary | ICD-10-CM | POA: Diagnosis not present

## 2015-02-03 DIAGNOSIS — I480 Paroxysmal atrial fibrillation: Secondary | ICD-10-CM | POA: Diagnosis not present

## 2015-02-03 DIAGNOSIS — Z8541 Personal history of malignant neoplasm of cervix uteri: Secondary | ICD-10-CM | POA: Diagnosis not present

## 2015-02-07 DIAGNOSIS — I4891 Unspecified atrial fibrillation: Secondary | ICD-10-CM | POA: Diagnosis not present

## 2015-02-07 DIAGNOSIS — I10 Essential (primary) hypertension: Secondary | ICD-10-CM | POA: Diagnosis not present

## 2015-02-07 DIAGNOSIS — J45909 Unspecified asthma, uncomplicated: Secondary | ICD-10-CM | POA: Diagnosis not present

## 2015-02-09 ENCOUNTER — Encounter (INDEPENDENT_AMBULATORY_CARE_PROVIDER_SITE_OTHER): Payer: Medicare Other | Admitting: HEMATOLOGY/ONCOLOGY

## 2015-02-14 DIAGNOSIS — Z13220 Encounter for screening for lipoid disorders: Secondary | ICD-10-CM | POA: Diagnosis not present

## 2015-02-14 DIAGNOSIS — N390 Urinary tract infection, site not specified: Secondary | ICD-10-CM | POA: Diagnosis not present

## 2015-02-16 DIAGNOSIS — I4891 Unspecified atrial fibrillation: Secondary | ICD-10-CM | POA: Diagnosis not present

## 2015-02-18 DIAGNOSIS — Z01810 Encounter for preprocedural cardiovascular examination: Secondary | ICD-10-CM | POA: Diagnosis not present

## 2015-02-18 DIAGNOSIS — I4891 Unspecified atrial fibrillation: Secondary | ICD-10-CM | POA: Diagnosis not present

## 2015-03-12 DIAGNOSIS — I480 Paroxysmal atrial fibrillation: Secondary | ICD-10-CM | POA: Diagnosis not present

## 2015-04-01 DIAGNOSIS — R030 Elevated blood-pressure reading, without diagnosis of hypertension: Secondary | ICD-10-CM | POA: Diagnosis not present

## 2015-04-01 DIAGNOSIS — R933 Abnormal findings on diagnostic imaging of other parts of digestive tract: Secondary | ICD-10-CM | POA: Diagnosis not present

## 2015-04-01 DIAGNOSIS — E780 Pure hypercholesterolemia: Secondary | ICD-10-CM | POA: Diagnosis not present

## 2015-04-01 DIAGNOSIS — Z7982 Long term (current) use of aspirin: Secondary | ICD-10-CM | POA: Diagnosis not present

## 2015-04-01 DIAGNOSIS — Z9884 Bariatric surgery status: Secondary | ICD-10-CM | POA: Diagnosis not present

## 2015-04-01 DIAGNOSIS — K9189 Other postprocedural complications and disorders of digestive system: Secondary | ICD-10-CM | POA: Diagnosis not present

## 2015-04-01 DIAGNOSIS — Z85038 Personal history of other malignant neoplasm of large intestine: Secondary | ICD-10-CM | POA: Diagnosis not present

## 2015-04-01 DIAGNOSIS — Z8541 Personal history of malignant neoplasm of cervix uteri: Secondary | ICD-10-CM | POA: Diagnosis not present

## 2015-04-01 DIAGNOSIS — J45909 Unspecified asthma, uncomplicated: Secondary | ICD-10-CM | POA: Diagnosis not present

## 2015-04-01 DIAGNOSIS — I4891 Unspecified atrial fibrillation: Secondary | ICD-10-CM | POA: Diagnosis not present

## 2015-04-01 DIAGNOSIS — Z87891 Personal history of nicotine dependence: Secondary | ICD-10-CM | POA: Diagnosis not present

## 2015-04-03 ENCOUNTER — Emergency Department (EMERGENCY_DEPARTMENT_HOSPITAL): Payer: Medicare Other

## 2015-04-03 ENCOUNTER — Emergency Department (EMERGENCY_DEPARTMENT_HOSPITAL)
Admission: EM | Admit: 2015-04-03 | Discharge: 2015-04-04 | Disposition: A | Discharge status: Home / Self Care | Payer: Medicare Other

## 2015-04-03 DIAGNOSIS — K760 Fatty (change of) liver, not elsewhere classified: Secondary | ICD-10-CM | POA: Diagnosis not present

## 2015-04-03 DIAGNOSIS — K439 Ventral hernia without obstruction or gangrene: Secondary | ICD-10-CM | POA: Diagnosis not present

## 2015-04-03 DIAGNOSIS — R109 Unspecified abdominal pain: Secondary | ICD-10-CM | POA: Diagnosis not present

## 2015-04-03 DIAGNOSIS — R112 Nausea with vomiting, unspecified: Secondary | ICD-10-CM | POA: Diagnosis not present

## 2015-04-04 DIAGNOSIS — C189 Malignant neoplasm of colon, unspecified: Secondary | ICD-10-CM | POA: Diagnosis not present

## 2015-04-04 DIAGNOSIS — K439 Ventral hernia without obstruction or gangrene: Secondary | ICD-10-CM | POA: Diagnosis not present

## 2015-04-12 DIAGNOSIS — S51802D Unspecified open wound of left forearm, subsequent encounter: Secondary | ICD-10-CM | POA: Diagnosis not present

## 2015-04-19 ENCOUNTER — Encounter (INDEPENDENT_AMBULATORY_CARE_PROVIDER_SITE_OTHER): Payer: Self-pay | Admitting: Surgery

## 2015-04-19 ENCOUNTER — Ambulatory Visit (INDEPENDENT_AMBULATORY_CARE_PROVIDER_SITE_OTHER): Payer: Medicare Other | Admitting: Surgery

## 2015-04-19 VITALS — BP 140/80 | Ht 62.0 in | Wt 267.0 lb

## 2015-04-19 DIAGNOSIS — K439 Ventral hernia without obstruction or gangrene: Secondary | ICD-10-CM | POA: Diagnosis not present

## 2015-04-19 NOTE — Progress Notes (Addendum)
[facility] History & Physical    Patient: Kelsey [name]  D.O.B.: Aug 01, 1951  MRN# [id number]  Date of Service: 04/19/2015  REFERRING PROVIDER: No ref. provider found    HPI  Kelsey [name] is a 64 y.o. female who was seen today for Follow-up After Testing. Patient had a colonoscopy on 04/13/15 which colorectal anastomosis could not be traversed. She then had a barium enema which showed mild stenosis and a rectosigmoid anastomosis. She is able to tolerate a normal diet. Having normal solid bowel movements. She complains of abdominal pain due to a hernia. This pain is intermittent. She reports this is a new hernia.     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           Past Surgical History   Procedure Laterality Date    Hx lap banding       2013    Hx cervical cone biopsy       Hx gastric bypass       25 years ago    Hx hysterectomy       late 1990s    Hx cesarean section      Hx hernia repair       1998, 1999 (x2)    Hx laparotomy       2013 to remove lap band          Current Outpatient Prescriptions   Medication Sig    aspirin (ECOTRIN) 81 mg Oral Tablet, Delayed Release (E.C.) Take 81 mg by mouth Once a day    atenolol (TENORMIN) 25 mg Oral Tablet Take 25 mg by mouth Once a day    atorvastatin (LIPITOR) 10 mg Oral Tablet Take 10 mg by mouth Once a day    budesonide-formoterol (SYMBICORT) 160-4.5 mcg/actuation Inhalation HFA Aerosol Inhaler Take 2 Puffs by inhalation Twice daily    celecoxib (CELEBREX) 400 mg Oral Capsule Take 400 mg by mouth Twice daily    diltiaZEM (CARDIZEM CD) 120 mg Oral Capsule, Sust. Release 24 hr Take 120 mg by mouth Once a day    docusate sodium (COLACE) 100 mg Oral Capsule Take 100 mg by mouth  Three times a day    hydrochlorothiazide (HYDRODIURIL) 25 mg Oral Tablet Take 25 mg by mouth Once a day    METOPROLOL SUCCINATE ORAL Take 25 mg by mouth Twice daily     omeprazole (PRILOSEC) 20 mg Oral Capsule, Delayed Release(E.C.) Take 20 mg by mouth Once a day    potassium chloride (K-DUR) 20 mEq Oral Tab Sust.Rel. Particle/Crystal Take 20 mEq by mouth Once a day    prochlorperazine (COMPAZINE) 10 mg Oral Tablet Take 1 Tab (10 mg total) by mouth Four times a day as needed for nausea/vomiting    traZODone (DESYREL) 100 mg Oral Tablet Take 100 mg by mouth Every night    VITAMIN B COMPLEX (SUPER B COMPLEX-B-12 ORAL) Take by mouth    vitamin E (AQUASOL E) 400 unit Oral Capsule Take 400 Units by mouth Once a day     Allergies   Allergen Reactions  Shellfish Containing Products Shortness of Breath, Rash and Itching     scallops    Iodine And Iodide Containing Products      Itching, shortness of breath       Social History   Substance Use Topics    Smoking status: Former Smoker    Smokeless tobacco: Not on file      Comment: quit 4 yrs ago    Alcohol Use: Yes      Comment: rarely     History   Drug Use No      Family History   Problem Relation Age of Onset    Diabetes Mother     Heart Attack Mother     Diabetes Maternal Grandmother     Diabetes Maternal Grandfather     Diabetes Father     Stroke Father              ROS:  General: Denies fever, chills, or any changes in weight.  EENT: Denies blurry vision, hearing loss.  Cardiovascular: Denies chest pain or palpitations.  Respiratory: Denies SOB, cough.  Gastrointestinal: Denies abdominal pain, N/V, constipation, or diarrhea. Denies any change in bowel movements or blood in stool.  GU: Denies dysuria, change in urinary habits, or hematuria.  Musculoskeletal: Denies weakness or joint pain. Denies trouble moving extremities.  Neurological: Denies HA, dizziness.  Hematologic: Denies hx of bleeding disorders or blood clots.  Psychiatric: Denies changes  in mood, anxiety, or depression.    Objective:  BP 140/80 mmHg   Ht 1.575 m ('5\' 2"'$ )   Wt 121.11 kg (267 lb)   BMI 48.82 kg/m2    Physical Exam:  Constitutional:  Alert, healthy, well nourished, no acute distress and not in pain.  HEENT: Normocephalic, atraumatic.   Neck: Trachea midline, supple.  Pulmonary: Normal effort and rate observed.  Cardiovascular:  S1, S2 normal;  No murmurs, rubs or gallops.  Regular rate and rhythm.  Abdomen: Abdomen soft and supple; No tenderness. Non-distended; No masses present. Bowel sounds normal and active in all 4 quadrants. Ventral hernia.   Extremities: No peripheral edema; No cyanosis or clubbing of nails.  Musculoskeletal: Normal muscle strength and tone of all four extremities.  Skin: No rashes or lesions present; Warm and dry. No jaundice.  Psychiatric: Normal mood and affect; Judgement and thought content normal.      Assessment:  ENCOUNTER DIAGNOSES     ICD-10-CM   1. 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 She then had a barium enema which showed mild stenosis and a rectosigmoid anastomosis. I was probably unable to traverse the anastomosis because I was unable to visualize it, possibly secondary to a ventral hernia. Will refer patient to GI for a colonoscopy and possible dilation of anastomosis. She states she is probably moving to [place] in the fall. She has a ventral hernia. She has had multiple ventral hernia repairs int he past. She was offered surgical correction of the hernia. She will follow up in a 2 months to reevaluate the hernia.   Return in about 2 months (around 06/19/2015).  She was given the opportunity to ask questions  and those questions were appropriately answered. She  agreed with the treatment plan and is encouraged to call with any additional questions or concerns.       Isabella Bowens, SCRIBE   I am scribing for, and in the presence of Dr. Domingo Madeira for services provided on 04/19/2015.  Isabella Bowens, SCRIBE

## 2015-05-10 ENCOUNTER — Ambulatory Visit (INDEPENDENT_AMBULATORY_CARE_PROVIDER_SITE_OTHER): Payer: Self-pay | Admitting: Gastroenterology

## 2015-06-14 ENCOUNTER — Encounter (INDEPENDENT_AMBULATORY_CARE_PROVIDER_SITE_OTHER): Payer: Medicare Other | Admitting: Surgery

## 2015-06-17 DIAGNOSIS — Z23 Encounter for immunization: Secondary | ICD-10-CM | POA: Diagnosis not present

## 2015-06-30 ENCOUNTER — Ambulatory Visit (INDEPENDENT_AMBULATORY_CARE_PROVIDER_SITE_OTHER): Payer: Self-pay | Admitting: Internal Medicine

## 2015-08-08 ENCOUNTER — Ambulatory Visit (INDEPENDENT_AMBULATORY_CARE_PROVIDER_SITE_OTHER): Payer: Self-pay | Admitting: Internal Medicine

## 2015-08-10 DIAGNOSIS — L259 Unspecified contact dermatitis, unspecified cause: Secondary | ICD-10-CM | POA: Diagnosis not present

## 2015-08-10 DIAGNOSIS — M25579 Pain in unspecified ankle and joints of unspecified foot: Secondary | ICD-10-CM | POA: Diagnosis not present

## 2015-08-10 DIAGNOSIS — C189 Malignant neoplasm of colon, unspecified: Secondary | ICD-10-CM | POA: Diagnosis not present

## 2015-08-10 DIAGNOSIS — I10 Essential (primary) hypertension: Secondary | ICD-10-CM | POA: Diagnosis not present

## 2015-08-10 DIAGNOSIS — J4520 Mild intermittent asthma, uncomplicated: Secondary | ICD-10-CM | POA: Diagnosis not present

## 2015-08-10 DIAGNOSIS — R7303 Prediabetes: Secondary | ICD-10-CM | POA: Diagnosis not present

## 2015-08-10 DIAGNOSIS — Z1389 Encounter for screening for other disorder: Secondary | ICD-10-CM | POA: Diagnosis not present

## 2015-09-12 DIAGNOSIS — I4891 Unspecified atrial fibrillation: Secondary | ICD-10-CM | POA: Diagnosis not present

## 2015-09-12 DIAGNOSIS — Z1239 Encounter for other screening for malignant neoplasm of breast: Secondary | ICD-10-CM | POA: Diagnosis not present

## 2015-09-12 DIAGNOSIS — E784 Other hyperlipidemia: Secondary | ICD-10-CM | POA: Diagnosis not present

## 2015-09-12 DIAGNOSIS — E2839 Other primary ovarian failure: Secondary | ICD-10-CM | POA: Diagnosis not present

## 2015-09-12 DIAGNOSIS — R7303 Prediabetes: Secondary | ICD-10-CM | POA: Diagnosis not present

## 2015-09-12 DIAGNOSIS — G4700 Insomnia, unspecified: Secondary | ICD-10-CM | POA: Diagnosis not present

## 2015-09-12 DIAGNOSIS — I10 Essential (primary) hypertension: Secondary | ICD-10-CM | POA: Diagnosis not present

## 2015-09-13 ENCOUNTER — Other Ambulatory Visit: Payer: Self-pay | Admitting: Internal Medicine

## 2015-09-13 DIAGNOSIS — E2839 Other primary ovarian failure: Secondary | ICD-10-CM

## 2015-10-07 ENCOUNTER — Ambulatory Visit
Admission: RE | Admit: 2015-10-07 | Discharge: 2015-10-07 | Disposition: A | Discharge status: Home / Self Care | Payer: Medicare Other | Source: Ambulatory Visit | Attending: Internal Medicine | Admitting: Internal Medicine

## 2015-10-07 DIAGNOSIS — E2839 Other primary ovarian failure: Secondary | ICD-10-CM

## 2015-10-07 DIAGNOSIS — M85852 Other specified disorders of bone density and structure, left thigh: Secondary | ICD-10-CM | POA: Diagnosis not present

## 2015-10-17 ENCOUNTER — Emergency Department (HOSPITAL_COMMUNITY): Payer: Medicare Other

## 2015-10-17 ENCOUNTER — Emergency Department (HOSPITAL_COMMUNITY)
Admission: EM | Admit: 2015-10-17 | Discharge: 2015-10-17 | Disposition: A | Discharge status: Home / Self Care | Payer: Medicare Other | Attending: Emergency Medicine | Admitting: Emergency Medicine

## 2015-10-17 ENCOUNTER — Encounter (HOSPITAL_COMMUNITY): Payer: Self-pay | Admitting: *Deleted

## 2015-10-17 DIAGNOSIS — R112 Nausea with vomiting, unspecified: Secondary | ICD-10-CM | POA: Insufficient documentation

## 2015-10-17 DIAGNOSIS — Z85038 Personal history of other malignant neoplasm of large intestine: Secondary | ICD-10-CM | POA: Diagnosis not present

## 2015-10-17 DIAGNOSIS — R1084 Generalized abdominal pain: Secondary | ICD-10-CM | POA: Diagnosis not present

## 2015-10-17 DIAGNOSIS — Z87891 Personal history of nicotine dependence: Secondary | ICD-10-CM | POA: Insufficient documentation

## 2015-10-17 DIAGNOSIS — K5900 Constipation, unspecified: Secondary | ICD-10-CM | POA: Diagnosis not present

## 2015-10-17 DIAGNOSIS — I10 Essential (primary) hypertension: Secondary | ICD-10-CM | POA: Diagnosis not present

## 2015-10-17 DIAGNOSIS — E663 Overweight: Secondary | ICD-10-CM | POA: Diagnosis not present

## 2015-10-17 DIAGNOSIS — J45909 Unspecified asthma, uncomplicated: Secondary | ICD-10-CM | POA: Diagnosis not present

## 2015-10-17 DIAGNOSIS — K449 Diaphragmatic hernia without obstruction or gangrene: Secondary | ICD-10-CM | POA: Diagnosis not present

## 2015-10-17 HISTORY — DX: Hyperlipidemia, unspecified: E78.5

## 2015-10-17 HISTORY — DX: Unspecified asthma, uncomplicated: J45.909

## 2015-10-17 HISTORY — DX: Essential (primary) hypertension: I10

## 2015-10-17 HISTORY — DX: Bariatric surgery status: Z98.84

## 2015-10-17 HISTORY — DX: Malignant neoplasm of colon, unspecified: C18.9

## 2015-10-17 LAB — I-STAT VENOUS BLOOD GAS, ED
Acid-Base Excess: 2 mmol/L (ref 0.0–2.0)
Bicarbonate: 26.3 mEq/L — ABNORMAL HIGH (ref 20.0–24.0)
O2 SAT: 93 %
PCO2 VEN: 40 mmHg — AB (ref 45.0–50.0)
PH VEN: 7.426 — AB (ref 7.250–7.300)
TCO2: 28 mmol/L (ref 0–100)
pO2, Ven: 66 mmHg — ABNORMAL HIGH (ref 30.0–45.0)

## 2015-10-17 LAB — COMPREHENSIVE METABOLIC PANEL
ALK PHOS: 80 U/L (ref 38–126)
ALT: 44 U/L (ref 14–54)
AST: 53 U/L — ABNORMAL HIGH (ref 15–41)
Albumin: 3.7 g/dL (ref 3.5–5.0)
Anion gap: 12 (ref 5–15)
BILIRUBIN TOTAL: 0.5 mg/dL (ref 0.3–1.2)
BUN: 23 mg/dL — ABNORMAL HIGH (ref 6–20)
CALCIUM: 9.2 mg/dL (ref 8.9–10.3)
CO2: 24 mmol/L (ref 22–32)
CREATININE: 0.94 mg/dL (ref 0.44–1.00)
Chloride: 105 mmol/L (ref 101–111)
Glucose, Bld: 183 mg/dL — ABNORMAL HIGH (ref 65–99)
Potassium: 3.7 mmol/L (ref 3.5–5.1)
Sodium: 141 mmol/L (ref 135–145)
Total Protein: 7 g/dL (ref 6.5–8.1)

## 2015-10-17 LAB — CBC
HCT: 40.5 % (ref 36.0–46.0)
Hemoglobin: 12.5 g/dL (ref 12.0–15.0)
MCH: 23 pg — ABNORMAL LOW (ref 26.0–34.0)
MCHC: 30.9 g/dL (ref 30.0–36.0)
MCV: 74.6 fL — ABNORMAL LOW (ref 78.0–100.0)
PLATELETS: 253 10*3/uL (ref 150–400)
RBC: 5.43 MIL/uL — AB (ref 3.87–5.11)
RDW: 16.5 % — ABNORMAL HIGH (ref 11.5–15.5)
WBC: 11.4 10*3/uL — AB (ref 4.0–10.5)

## 2015-10-17 LAB — LIPASE, BLOOD: Lipase: 31 U/L (ref 11–51)

## 2015-10-17 LAB — I-STAT CG4 LACTIC ACID, ED
LACTIC ACID, VENOUS: 1.94 mmol/L (ref 0.5–2.0)
Lactic Acid, Venous: 1.77 mmol/L (ref 0.5–2.0)

## 2015-10-17 MED ORDER — ONDANSETRON 4 MG PO TBDP: 4.0000 mg | ORAL_TABLET | Freq: Three times a day (TID) | ORAL | Status: DC | PRN

## 2015-10-17 MED ORDER — MORPHINE SULFATE (PF) 4 MG/ML IV SOLN
4.0000 mg | Freq: Once | INTRAVENOUS | Status: AC
  Administered 2015-10-17: 4 mg via INTRAVENOUS
  Filled 2015-10-17: qty 1

## 2015-10-17 MED ORDER — SODIUM CHLORIDE 0.9 % IV BOLUS (SEPSIS)
1000.0000 mL | Freq: Once | INTRAVENOUS | Status: AC
  Administered 2015-10-17: 1000 mL via INTRAVENOUS

## 2015-10-17 MED ORDER — ONDANSETRON HCL 4 MG/2ML IJ SOLN
4.0000 mg | Freq: Once | INTRAMUSCULAR | Status: AC
  Administered 2015-10-17: 4 mg via INTRAVENOUS
  Filled 2015-10-17: qty 2

## 2015-10-17 NOTE — ED Notes (Signed)
Pt c/o generalized abd pain starting this afternoon. Emesis x3. Denies diarrhea, endorses constipation, last BM 2 days ago.

## 2015-10-17 NOTE — Discharge Instructions (Signed)
You were seen today for abdominal pain and vomiting. There is no evidence of obstruction on her CT scan. You do have evidence of a ventral hernia as well as a small area that appears to be strictured from your prior surgeries. Her pain improved and you were able to tolerate fluids. He'll be sent home with nausea medication. If her pain worsens or you begin vomiting, you need to be reevaluated immediately.  Abdominal Pain, Adult Many things can cause abdominal pain. Usually, abdominal pain is not caused by a disease and will improve without treatment. It can often be observed and treated at home. Your health care provider will do a physical exam and possibly order blood tests and X-rays to help determine the seriousness of your pain. However, in many cases, more time must pass before a clear cause of the pain can be found. Before that point, your health care provider may not know if you need more testing or further treatment. HOME CARE INSTRUCTIONS Monitor your abdominal pain for any changes. The following actions may help to alleviate any discomfort you are experiencing:  Only take over-the-counter or prescription medicines as directed by your health care provider.  Do not take laxatives unless directed to do so by your health care provider.  Try a clear liquid diet (broth, tea, or water) as directed by your health care provider. Slowly move to a bland diet as tolerated. SEEK MEDICAL CARE IF:  You have unexplained abdominal pain.  You have abdominal pain associated with nausea or diarrhea.  You have pain when you urinate or have a bowel movement.  You experience abdominal pain that wakes you in the night.  You have abdominal pain that is worsened or improved by eating food.  You have abdominal pain that is worsened with eating fatty foods.  You have a fever. SEEK IMMEDIATE MEDICAL CARE IF:  Your pain does not go away within 2 hours.  You keep throwing up (vomiting).  Your pain is  felt only in portions of the abdomen, such as the right side or the left lower portion of the abdomen.  You pass bloody or black tarry stools. MAKE SURE YOU:  Understand these instructions.  Will watch your condition.  Will get help right away if you are not doing well or get worse.   This information is not intended to replace advice given to you by your health care provider. Make sure you discuss any questions you have with your health care provider.   Document Released: 05/16/2005 Document Revised: 04/27/2015 Document Reviewed: 04/15/2013 Elsevier Interactive Patient Education [employer].

## 2015-10-17 NOTE — ED Notes (Signed)
Pt left with all her belongings and ambulated out of the treatment area.  

## 2015-10-17 NOTE — ED Provider Notes (Signed)
CSN: [id number]     Arrival date & time 10/17/15  0006 History  By signing my name below, I, Rowan Blase, attest that this documentation has been prepared under the direction and in the presence of Merryl Hacker, MD . Electronically Signed: Rowan Blase, Scribe. 10/17/2015. 12:36 AM.    Chief Complaint  Patient presents with  . Abdominal Pain   The history is provided by the patient. No language interpreter was used.   HPI Comments:  Suzanne Macdonald is a 65 y.o. female with PMHx of HTN, HLD, A-fib, and colon cancer who presents to the Emergency Department complaining of 10/10, generalized, sharp abdominal pain onset yesterday afternoon, radiating to her back. Pt reports associated vomiting, with three episodes yesterday; she also reports constipation with last BM two days ago. Pt notes she has had similar pain before with constipation and past bowel obstruction. No alleviating factors noted. She states she has a surgical h/o hysterectomy, gastric bypass, and colon cancer with bowel resection. Pt denies diarrhea, fever, or chest pain.   Past Medical History  Diagnosis Date  . Hypertension   . Hyperlipidemia   . Cancer ([place])   . Colon cancer ([place])   . Asthma   . H/O gastric bypass    Past Surgical History  Procedure Laterality Date  . Bowel resection     No family history on file. Social History  Substance Use Topics  . Smoking status: Former Research scientist (life sciences)  . Smokeless tobacco: None  . Alcohol Use: No   OB History    No data available     Review of Systems  Constitutional: Negative for fever.  Cardiovascular: Negative for chest pain.  Gastrointestinal: Positive for vomiting, abdominal pain and constipation. Negative for diarrhea.  Musculoskeletal: Positive for back pain.  All other systems reviewed and are negative.  Allergies  Iodine  Home Medications   Prior to Admission medications   Medication Sig Start Date End Date Taking? Authorizing Provider  ondansetron  (ZOFRAN ODT) 4 MG disintegrating tablet Take 1 tablet (4 mg total) by mouth every 8 (eight) hours as needed for nausea or vomiting. 10/17/15   Merryl Hacker, MD   BP 130/69 mmHg  Pulse 65  Temp(Src) 98 F (36.7 C) (Oral)  Resp 14  SpO2 95% Physical Exam  Constitutional: She is oriented to person, place, and time.  Uncomfortable appearing, overweight  HENT:  Head: Normocephalic and atraumatic.  Cardiovascular: Normal rate, regular rhythm and normal heart sounds.   No murmur heard. Pulmonary/Chest: Effort normal and breath sounds normal. No respiratory distress. She has no wheezes.  Abdominal: Soft. She exhibits distension. There is tenderness. There is guarding. There is no rebound.  Hyperactive bowel sounds, diffuse tenderness to palpation, voluntary guarding, ventral hernia, reducible, no overlying skin changes  Neurological: She is alert and oriented to person, place, and time.  Skin: Skin is warm and dry.  Psychiatric: She has a normal mood and affect.  Nursing note and vitals reviewed.   ED Course  Procedures  DIAGNOSTIC STUDIES:  Oxygen Saturation is 96% on [place], normal by my interpretation.    COORDINATION OF CARE:  12:34 AM Will administer pain and nausea medication. Will order CT A/P w/o contrast, EKG, and lab work. Discussed treatment plan with pt at bedside and pt agreed to plan.  Labs Review Labs Reviewed  COMPREHENSIVE METABOLIC PANEL - Abnormal; Notable for the following:    Glucose, Bld 183 (*)    BUN 23 (*)    AST  53 (*)    All other components within normal limits  CBC - Abnormal; Notable for the following:    WBC 11.4 (*)    RBC 5.43 (*)    MCV 74.6 (*)    MCH 23.0 (*)    RDW 16.5 (*)    All other components within normal limits  LIPASE, BLOOD  URINALYSIS, ROUTINE W REFLEX MICROSCOPIC (NOT AT [facility])  I-STAT CG4 LACTIC ACID, ED  I-STAT CG4 LACTIC ACID, ED    Imaging Review Ct Abdomen Pelvis Wo Contrast  10/17/2015  CLINICAL DATA:  65 year old  female with generalized abdominal pain and vomiting. History of bowel obstruction. EXAM: CT ABDOMEN AND PELVIS WITHOUT CONTRAST TECHNIQUE: Multidetector CT imaging of the abdomen and pelvis was performed following the standard protocol without IV contrast. COMPARISON:  None. FINDINGS: Evaluation of this exam is limited in the absence of intravenous contrast. The visualized lung bases are clear. No intra-abdominal free air or free fluid. Diffuse hepatic steatosis. The liver, pancreas, spleen, adrenal glands, kidneys, visualized ureters, and urinary bladder appear unremarkable. Hysterectomy. Evaluation of the bowel is limited in the absence of oral contrast. There are postsurgical changes of bowel resection with multiple anastomotic sutures. There is no evidence of small-bowel obstruction. There is anastomotic suture within the sigmoid colon. There is apparent caliber change at the sigmoid anastomosis. There is copious amount of stool within the colon proximal to this anastomosis concerning for a degree of anastomotic stricture. There is a 2.8 cm defect in the left anterior abdominal wall with herniation of a small segment of the distal transverse colon. There is a focal area of narrowing of the colon at the neck of the hernia without definite evidence of obstruction at this site. There is abutment of the transverse colon to the anterior abdominal wall compatible with adhesions. No inflammatory changes identified. Normal appendix. There is aortoiliac atherosclerotic disease. No portal venous gas identified. There is no adenopathy. Midline vertical anterior pelvic wall incisional scar. Multiple small fat containing hernia noted along the incisional scar. There is degenerative changes of the spine. No acute fracture. IMPRESSION: Postsurgical changes of bowel resection with multiple anastomotic sutures. No evidence of small-bowel obstruction. Apparent focal narrowing at the sigmoid anastomosis with copious amount of  stool proximally concerning for a degree of stricture at the sigmoid anastomosis. Left anterior abdominal wall hernia containing a short segment of the distal transverse colon without definite evidence of obstruction at this site. Clinical correlation is recommended. Electronically Signed   By: Anner Crete M.D.   On: 10/17/2015 02:08   I have personally reviewed and evaluated these images and lab results as part of my medical decision-making.   EKG Interpretation   Date/Time:  Monday October 17 2015 00:40:48 EST Ventricular Rate:  61 PR Interval:  147 QRS Duration: 91 QT Interval:  448 QTC Calculation: 451 R Axis:   67 Text Interpretation:  Sinus rhythm No prior for comparison Confirmed by  Karielle Davidow  MD, Perl Kerney ([id number]) on 10/17/2015 12:46:00 AM      MDM   Final diagnoses:  Generalized abdominal pain  Non-intractable vomiting with nausea, vomiting of unspecified type    Patient presents with abdominal pain and vomiting. Is uncomfortable appearing on exam. Voluntary guarding. No signs of peritonitis. She has extensive surgical history. She also has a palpable hernia on exam. It is reducible without overlying skin changes. Patient was given pain and nausea medication. Basic labwork obtained  and notable for mild leukocytosis. Otherwise largely reassuring including lactate.  CT scan of the abdomen obtained to rule out small bowel obstruction. No evidence of incarcerated hernia. CT scan without evidence of SBO but does show some evidence of possible stricture near her sigmoid anastomosis. No definite obstruction. On recheck, patient reports improvement of her symptoms. She is able to tolerate fluids. She is new to the area. She has established primary care.  She was told about her stricture. I have advised the patient to continue with clear diet until she feels improved. She will be discharged with Zofran. Close primary care follow-up. She was given strict return precautions.  After  history, exam, and medical workup I feel the patient has been appropriately medically screened and is safe for discharge home. Pertinent diagnoses were discussed with the patient. Patient was given return precautions.  I personally performed the services described in this documentation, which was scribed in my presence. The recorded information has been reviewed and is accurate.   Merryl Hacker, MD 10/17/15 [phone]

## 2016-01-21 ENCOUNTER — Encounter (HOSPITAL_COMMUNITY): Payer: Self-pay | Admitting: Emergency Medicine

## 2016-01-21 ENCOUNTER — Emergency Department (HOSPITAL_COMMUNITY)
Admission: EM | Admit: 2016-01-21 | Discharge: 2016-01-22 | Disposition: A | Discharge status: Home / Self Care | Payer: Medicare Other | Attending: Emergency Medicine | Admitting: Emergency Medicine

## 2016-01-21 DIAGNOSIS — R1084 Generalized abdominal pain: Secondary | ICD-10-CM | POA: Diagnosis not present

## 2016-01-21 DIAGNOSIS — M545 Low back pain: Secondary | ICD-10-CM | POA: Diagnosis not present

## 2016-01-21 DIAGNOSIS — Z8639 Personal history of other endocrine, nutritional and metabolic disease: Secondary | ICD-10-CM | POA: Diagnosis not present

## 2016-01-21 DIAGNOSIS — R197 Diarrhea, unspecified: Secondary | ICD-10-CM | POA: Insufficient documentation

## 2016-01-21 DIAGNOSIS — K439 Ventral hernia without obstruction or gangrene: Secondary | ICD-10-CM | POA: Diagnosis not present

## 2016-01-21 DIAGNOSIS — J45909 Unspecified asthma, uncomplicated: Secondary | ICD-10-CM | POA: Insufficient documentation

## 2016-01-21 DIAGNOSIS — Z85038 Personal history of other malignant neoplasm of large intestine: Secondary | ICD-10-CM | POA: Diagnosis not present

## 2016-01-21 DIAGNOSIS — Z87891 Personal history of nicotine dependence: Secondary | ICD-10-CM | POA: Diagnosis not present

## 2016-01-21 DIAGNOSIS — I10 Essential (primary) hypertension: Secondary | ICD-10-CM | POA: Diagnosis not present

## 2016-01-21 DIAGNOSIS — Z9884 Bariatric surgery status: Secondary | ICD-10-CM | POA: Insufficient documentation

## 2016-01-21 DIAGNOSIS — R112 Nausea with vomiting, unspecified: Secondary | ICD-10-CM

## 2016-01-21 LAB — CBC WITH DIFFERENTIAL/PLATELET
BASOS PCT: 0 %
Basophils Absolute: 0 10*3/uL (ref 0.0–0.1)
Eosinophils Absolute: 0.2 10*3/uL (ref 0.0–0.7)
Eosinophils Relative: 1 %
HEMATOCRIT: 40.1 % (ref 36.0–46.0)
HEMOGLOBIN: 12 g/dL (ref 12.0–15.0)
Lymphocytes Relative: 17 %
Lymphs Abs: 2.4 10*3/uL (ref 0.7–4.0)
MCH: 23.4 pg — ABNORMAL LOW (ref 26.0–34.0)
MCHC: 29.9 g/dL — AB (ref 30.0–36.0)
MCV: 78.3 fL (ref 78.0–100.0)
MONOS PCT: 5 %
Monocytes Absolute: 0.8 10*3/uL (ref 0.1–1.0)
NEUTROS ABS: 10.6 10*3/uL — AB (ref 1.7–7.7)
NEUTROS PCT: 77 %
Platelets: 287 10*3/uL (ref 150–400)
RBC: 5.12 MIL/uL — AB (ref 3.87–5.11)
RDW: 18.1 % — ABNORMAL HIGH (ref 11.5–15.5)
WBC: 14 10*3/uL — AB (ref 4.0–10.5)

## 2016-01-21 MED ORDER — ONDANSETRON HCL 4 MG/2ML IJ SOLN
4.0000 mg | Freq: Once | INTRAMUSCULAR | Status: AC
  Administered 2016-01-22: 4 mg via INTRAVENOUS
  Filled 2016-01-21: qty 2

## 2016-01-21 MED ORDER — FENTANYL CITRATE (PF) 100 MCG/2ML IJ SOLN
100.0000 ug | Freq: Once | INTRAMUSCULAR | Status: AC
  Administered 2016-01-22: 100 ug via INTRAVENOUS
  Filled 2016-01-21: qty 2

## 2016-01-21 NOTE — ED Provider Notes (Signed)
CSN: [id number]     Arrival date & time 01/21/16  2301 History  By signing my name below, I, Jasmyn B. Alexander, attest that this documentation has been prepared under the direction and in the presence of Davonna Belling, MD.  Electronically Signed: Tedra Coupe. Sheppard Coil, ED Scribe. 01/21/2016. 12:13 AM.    Chief Complaint  Patient presents with  . Abdominal Pain   The history is provided by the patient. No language interpreter was used.    HPI Comments: Suzanne Macdonald is a 65 y.o. female with PMHx of HTN, HLD, Cancer and PSHx of Bowel Resection and Gastric Bypass, who presents to the Emergency Department complaining of gradual onset, constant, diffuse abdominal pain radiating to the lower back that occurred 8 hours PTA. Abdominal pain is exacerbated with pressure to the area, without any alleviating factors. Pt has associated diarrhea and episodes of vomiting. Pt took medicine for diarrhea, relief not noted. Denies any sick contact. Denies fever, chills, dysuria, frequency, urgency.  Past Medical History  Diagnosis Date  . Hypertension   . Hyperlipidemia   . Cancer ([place])   . Colon cancer ([place])   . Asthma   . H/O gastric bypass    Past Surgical History  Procedure Laterality Date  . Bowel resection     No family history on file. Social History  Substance Use Topics  . Smoking status: Former Research scientist (life sciences)  . Smokeless tobacco: None  . Alcohol Use: No   OB History    No data available     Review of Systems  Constitutional: Negative for fever and chills.  Gastrointestinal: Positive for vomiting, abdominal pain and diarrhea.  Genitourinary: Negative for dysuria, urgency and frequency.  Musculoskeletal: Positive for back pain.  All other systems reviewed and are negative.   Allergies  Iodine  Home Medications   Prior to Admission medications   Medication Sig Start Date End Date Taking? Authorizing Provider  ondansetron (ZOFRAN-ODT) 8 MG disintegrating tablet Take 1 tablet (8 mg  total) by mouth every 8 (eight) hours as needed for nausea or vomiting. 01/22/16   Davonna Belling, MD   BP 149/72 mmHg  Pulse 57  Temp(Src) 98.4 F (36.9 C) (Oral)  Resp 16  SpO2 95% Physical Exam  Constitutional: She is oriented to person, place, and time. She appears well-developed and well-nourished. No distress.  HENT:  Head: Normocephalic and atraumatic.  Eyes: Conjunctivae are normal.  Cardiovascular: Normal rate, regular rhythm and normal heart sounds.   Pulmonary/Chest: Effort normal and breath sounds normal.  Abdominal: Soft. She exhibits no distension. There is tenderness.  Obese. Left upper abdomen has firm mass, tenderness to palpation. Able to minimally reduce. Previous ventral hernia at site. Several healed surgical scars noted.   Musculoskeletal: She exhibits no edema.  No peripheral edema  Neurological: She is alert and oriented to person, place, and time.  Skin: Skin is warm and dry.  Psychiatric: She has a normal mood and affect.  Nursing note and vitals reviewed.   ED Course  Procedures (including critical care time) COORDINATION OF CARE: 11:36 PM-Discussed treatment plan which includes CBC panel, CMP, UA with pt at bedside and pt agreed to plan. Will order Zofran and Sublimaze.   Labs Review Labs Reviewed  CBC WITH DIFFERENTIAL/PLATELET - Abnormal; Notable for the following:    WBC 14.0 (*)    RBC 5.12 (*)    MCH 23.4 (*)    MCHC 29.9 (*)    RDW 18.1 (*)    Neutro Abs  10.6 (*)    All other components within normal limits  COMPREHENSIVE METABOLIC PANEL - Abnormal; Notable for the following:    Potassium 3.3 (*)    CO2 21 (*)    Glucose, Bld 161 (*)    Total Protein 6.2 (*)    Albumin 3.3 (*)    All other components within normal limits  URINALYSIS, ROUTINE W REFLEX MICROSCOPIC (NOT AT [facility]) - Abnormal; Notable for the following:    Color, Urine AMBER (*)    APPearance CLOUDY (*)    Specific Gravity, Urine 1.038 (*)    Bilirubin Urine SMALL (*)     All other components within normal limits  LIPASE, BLOOD    Imaging Review Dg Abd Acute W/chest  01/22/2016  CLINICAL DATA:  Acute onset of generalized abdominal pain and lower back pain. Nausea, vomiting and diarrhea. Initial encounter. EXAM: DG ABDOMEN ACUTE W/ 1V CHEST COMPARISON:  CT of the abdomen and pelvis from 10/17/2015 FINDINGS: The lungs are well-aerated. Vascular congestion is noted. Mild bibasilar atelectasis is noted. There is no evidence of pleural effusion or pneumothorax. The cardiomediastinal silhouette is borderline normal in size. A right-sided chest port is noted ending about the mid SVC. The visualized bowel gas pattern is unremarkable. The colon is largely filled with air; there is no evidence of small bowel dilatation to suggest obstruction. No free intra-abdominal air is identified on the provided upright view. Postoperative change is noted about the gastroesophageal junction. Minimal calcification is noted overlying both humeral heads, likely reflecting mild calcific tendinitis bilaterally. No acute osseous abnormalities are seen; the sacroiliac joints are unremarkable in appearance. Mild degenerative change is noted along the lumbar spine. IMPRESSION: 1. Unremarkable bowel gas pattern; no free intra-abdominal air seen. Colon largely filled with air, and a small amount of stool. 2. Vascular congestion noted.  Mild bibasilar atelectasis seen. 3. Minimal calcification overlying both humeral heads, likely reflecting mild calcific tendinitis bilaterally. Electronically Signed   By: Garald Balding M.D.   On: 01/22/2016 00:53   I have personally reviewed and evaluated these images and lab results as part of my medical decision-making.   EKG Interpretation None      MDM   Final diagnoses:  Generalized abdominal pain  Nausea vomiting and diarrhea  Ventral hernia without obstruction or gangrene    Patient abdominal pain nausea vomiting diarrhea. Has ventral hernia that  initially was tender but eventually did reduce. X-ray shows no obstruction. Lab work shows elevated white count. She feels better after treatment. Reviewed previous CT scan that showed hernia and some luminal stenosis. At this point doubt severe obstruction. Will discharge home to follow-up with primary care doctor and general surgery as needed.  I personally performed the services described in this documentation, which was scribed in my presence. The recorded information has been reviewed and is accurate.      Davonna Belling, MD 01/22/16 [phone]

## 2016-01-21 NOTE — ED Notes (Signed)
Pt c/o generalized abd pain, lower back pain, nausea, vomiting and diarrhea.  Onset earlier today.

## 2016-01-22 ENCOUNTER — Emergency Department (HOSPITAL_COMMUNITY): Payer: Medicare Other

## 2016-01-22 DIAGNOSIS — K439 Ventral hernia without obstruction or gangrene: Secondary | ICD-10-CM | POA: Diagnosis not present

## 2016-01-22 DIAGNOSIS — R1084 Generalized abdominal pain: Secondary | ICD-10-CM | POA: Diagnosis not present

## 2016-01-22 LAB — URINALYSIS, ROUTINE W REFLEX MICROSCOPIC
Glucose, UA: NEGATIVE mg/dL
HGB URINE DIPSTICK: NEGATIVE
Ketones, ur: NEGATIVE mg/dL
Leukocytes, UA: NEGATIVE
Nitrite: NEGATIVE
PROTEIN: NEGATIVE mg/dL
Specific Gravity, Urine: 1.038 — ABNORMAL HIGH (ref 1.005–1.030)
pH: 5 (ref 5.0–8.0)

## 2016-01-22 LAB — COMPREHENSIVE METABOLIC PANEL
ALK PHOS: 88 U/L (ref 38–126)
ALT: 34 U/L (ref 14–54)
AST: 33 U/L (ref 15–41)
Albumin: 3.3 g/dL — ABNORMAL LOW (ref 3.5–5.0)
Anion gap: 11 (ref 5–15)
BILIRUBIN TOTAL: 0.6 mg/dL (ref 0.3–1.2)
BUN: 20 mg/dL (ref 6–20)
CALCIUM: 9 mg/dL (ref 8.9–10.3)
CO2: 21 mmol/L — ABNORMAL LOW (ref 22–32)
CREATININE: 0.88 mg/dL (ref 0.44–1.00)
Chloride: 110 mmol/L (ref 101–111)
Glucose, Bld: 161 mg/dL — ABNORMAL HIGH (ref 65–99)
Potassium: 3.3 mmol/L — ABNORMAL LOW (ref 3.5–5.1)
Sodium: 142 mmol/L (ref 135–145)
TOTAL PROTEIN: 6.2 g/dL — AB (ref 6.5–8.1)

## 2016-01-22 LAB — LIPASE, BLOOD: LIPASE: 28 U/L (ref 11–51)

## 2016-01-22 MED ORDER — ONDANSETRON 8 MG PO TBDP: 8.0000 mg | ORAL_TABLET | Freq: Three times a day (TID) | ORAL | Status: DC | PRN

## 2016-01-22 NOTE — Discharge Instructions (Signed)
Abdominal Pain, Adult °Many things can cause abdominal pain. Usually, abdominal pain is not caused by a disease and will improve without treatment. It can often be observed and treated at home. Your health care provider will do a physical exam and possibly order blood tests and X-rays to help determine the seriousness of your pain. However, in many cases, more time must pass before a clear cause of the pain can be found. Before that point, your health care provider may not know if you need more testing or further treatment. °HOME CARE INSTRUCTIONS °Monitor your abdominal pain for any changes. The following actions may help to alleviate any discomfort you are experiencing: °· Only take over-the-counter or prescription medicines as directed by your health care provider. °· Do not take laxatives unless directed to do so by your health care provider. °· Try a clear liquid diet (broth, tea, or water) as directed by your health care provider. Slowly move to a bland diet as tolerated. °SEEK MEDICAL CARE IF: °· You have unexplained abdominal pain. °· You have abdominal pain associated with nausea or diarrhea. °· You have pain when you urinate or have a bowel movement. °· You experience abdominal pain that wakes you in the night. °· You have abdominal pain that is worsened or improved by eating food. °· You have abdominal pain that is worsened with eating fatty foods. °· You have a fever. °SEEK IMMEDIATE MEDICAL CARE IF: °· Your pain does not go away within 2 hours. °· You keep throwing up (vomiting). °· Your pain is felt only in portions of the abdomen, such as the right side or the left lower portion of the abdomen. °· You pass bloody or black tarry stools. °MAKE SURE YOU: °· Understand these instructions. °· Will watch your condition. °· Will get help right away if you are not doing well or get worse. °  °This information is not intended to replace advice given to you by your health care provider. Make sure you discuss  any questions you have with your health care provider. °  °Document Released: 05/16/2005 Document Revised: 04/27/2015 Document Reviewed: 04/15/2013 °Elsevier Interactive Patient Education ©2016 Elsevier Inc[employer] ° °Nausea and Vomiting °Nausea is a sick feeling that often comes before throwing up (vomiting). Vomiting is a reflex where stomach contents come out of your mouth. Vomiting can cause severe loss of body fluids (dehydration). Children and elderly adults can become dehydrated quickly, especially if they also have diarrhea. Nausea and vomiting are symptoms of a condition or disease. It is important to find the cause of your symptoms. °CAUSES  °· Direct irritation of the stomach lining. This irritation can result from increased acid production (gastroesophageal reflux disease), infection, food poisoning, taking certain medicines (such as nonsteroidal anti-inflammatory drugs), alcohol use, or tobacco use. °· Signals from the brain. These signals could be caused by a headache, heat exposure, an inner ear disturbance, increased pressure in the brain from injury, infection, a tumor, or a concussion, pain, emotional stimulus, or metabolic problems. °· An obstruction in the gastrointestinal tract (bowel obstruction). °· Illnesses such as diabetes, hepatitis, gallbladder problems, appendicitis, kidney problems, cancer, sepsis, atypical symptoms of a heart attack, or eating disorders. °· Medical treatments such as chemotherapy and radiation. °· Receiving medicine that makes you sleep (general anesthetic) during surgery. °DIAGNOSIS °Your caregiver may ask for tests to be done if the problems do not improve after a few days. Tests may also be done if symptoms are severe or if the reason for the   nausea and vomiting is not clear. Tests may include:  Urine tests.  Blood tests.  Stool tests.  Cultures (to look for evidence of infection).  X-rays or other imaging studies. Test results can help your caregiver make  decisions about treatment or the need for additional tests. TREATMENT You need to stay well hydrated. Drink frequently but in small amounts.You may wish to drink water, sports drinks, clear broth, or eat frozen ice pops or gelatin dessert to help stay hydrated.When you eat, eating slowly may help prevent nausea.There are also some antinausea medicines that may help prevent nausea. HOME CARE INSTRUCTIONS   Take all medicine as directed by your caregiver.  If you do not have an appetite, do not force yourself to eat. However, you must continue to drink fluids.  If you have an appetite, eat a normal diet unless your caregiver tells you differently.  Eat a variety of complex carbohydrates (rice, wheat, potatoes, bread), lean meats, yogurt, fruits, and vegetables.  Avoid high-fat foods because they are more difficult to digest.  Drink enough water and fluids to keep your urine clear or pale yellow.  If you are dehydrated, ask your caregiver for specific rehydration instructions. Signs of dehydration may include:  Severe thirst.  Dry lips and mouth.  Dizziness.  Dark urine.  Decreasing urine frequency and amount.  Confusion.  Rapid breathing or pulse. SEEK IMMEDIATE MEDICAL CARE IF:   You have blood or brown flecks (like coffee grounds) in your vomit.  You have black or bloody stools.  You have a severe headache or stiff neck.  You are confused.  You have severe abdominal pain.  You have chest pain or trouble breathing.  You do not urinate at least once every 8 hours.  You develop cold or clammy skin.  You continue to vomit for longer than 24 to 48 hours.  You have a fever. MAKE SURE YOU:   Understand these instructions.  Will watch your condition.  Will get help right away if you are not doing well or get worse.   This information is not intended to replace advice given to you by your health care provider. Make sure you discuss any questions you have with  your health care provider.   Document Released: 08/06/2005 Document Revised: 10/29/2011 Document Reviewed: 01/03/2011 Elsevier Interactive Patient Education 2016 [place], Adult A hernia is the bulging of an organ or tissue through a weak spot in the muscles of the abdomen (abdominal wall). Hernias develop most often near the navel or groin. There are many kinds of hernias. Common kinds include:  Femoral hernia. This kind of hernia develops under the groin in the upper thigh area.  Inguinal hernia. This kind of hernia develops in the groin or scrotum.  Umbilical hernia. This kind of hernia develops near the navel.  Hiatal hernia. This kind of hernia causes part of the stomach to be pushed up into the chest.  Incisional hernia. This kind of hernia bulges through a scar from an abdominal surgery. CAUSES This condition may be caused by:  Heavy lifting.  Coughing over a long period of time.  Straining to have a bowel movement.  An incision made during an abdominal surgery.  A birth defect (congenital defect).  Excess weight or obesity.  Smoking.  Poor nutrition.  Cystic fibrosis.  Excess fluid in the abdomen.  Undescended testicles. SYMPTOMS Symptoms of a hernia include:  A lump on the abdomen. This is the first sign of a hernia.  The lump may become more obvious with standing, straining, or coughing. It may get bigger over time if it is not treated or if the condition causing it is not treated.  Pain. A hernia is usually painless, but it may become painful over time if treatment is delayed. The pain is usually dull and may get worse with standing or lifting heavy objects. Sometimes a hernia gets tightly squeezed in the weak spot (strangulated) or stuck there (incarcerated) and causes additional symptoms. These symptoms may include:  Vomiting.  Nausea.  Constipation.  Irritability. DIAGNOSIS A hernia may be diagnosed with:  A physical exam. During  the exam your health care provider may ask you to cough or to make a specific movement, because a hernia is usually more visible when you move.  Imaging tests. These can include:  X-rays.  Ultrasound.  CT scan. TREATMENT A hernia that is small and painless may not need to be treated. A hernia that is large or painful may be treated with surgery. Inguinal hernias may be treated with surgery to prevent incarceration or strangulation. Strangulated hernias are always treated with surgery, because lack of blood to the trapped organ or tissue can cause it to die. Surgery to treat a hernia involves pushing the bulge back into place and repairing the weak part of the abdomen. HOME CARE INSTRUCTIONS  Avoid straining.  Do not lift anything heavier than 10 lb (4.5 kg).  Lift with your leg muscles, not your back muscles. This helps avoid strain.  When coughing, try to cough gently.  Prevent constipation. Constipation leads to straining with bowel movements, which can make a hernia worse or cause a hernia repair to break down. You can prevent constipation by:  Eating a high-fiber diet that includes plenty of fruits and vegetables.  Drinking enough fluids to keep your urine clear or pale yellow. Aim to drink 6-8 glasses of water per day.  Using a stool softener as directed by your health care provider.  Lose weight, if you are overweight.  Do not use any tobacco products, including cigarettes, chewing tobacco, or electronic cigarettes. If you need help quitting, ask your health care provider.  Keep all follow-up visits as directed by your health care provider. This is important. Your health care provider may need to monitor your condition. SEEK MEDICAL CARE IF:  You have swelling, redness, and pain in the affected area.  Your bowel habits change. SEEK IMMEDIATE MEDICAL CARE IF:  You have a fever.  You have abdominal pain that is getting worse.  You feel nauseous or you vomit.  You  cannot push the hernia back in place by gently pressing on it while you are lying down.  The hernia:  Changes in shape or size.  Is stuck outside the abdomen.  Becomes discolored.  Feels hard or tender.   This information is not intended to replace advice given to you by your health care provider. Make sure you discuss any questions you have with your health care provider.   Document Released: 08/06/2005 Document Revised: 08/27/2014 Document Reviewed: 06/16/2014 Elsevier Interactive Patient Education [employer].

## 2016-01-22 NOTE — ED Notes (Signed)
This RN went in to assist the pt and attempt to start a IV. The pt began to curse and yell at this RN. This RN requested that the pt not curse and yell that she was here to help her. The pt continued to yell and stated  This RN apologized to the pt for the wait and informed the pt that she is not her only pt and that she was stuck in another room. Pt responded "I don't give a damn. I want my pain medicine." The RN responded and again asked the pt not to curse at the RN. The pt continued to curse and stated "Just because you're 65 years old and healthy doesn't mean anything." The pt just rang the call light again and began yelling.

## 2016-01-23 ENCOUNTER — Telehealth: Payer: Self-pay | Admitting: Pulmonary Disease

## 2016-01-23 NOTE — Telephone Encounter (Signed)
APT. REMINDER CALL, LMTCB °

## 2016-01-24 ENCOUNTER — Ambulatory Visit (INDEPENDENT_AMBULATORY_CARE_PROVIDER_SITE_OTHER): Payer: Medicare Other | Admitting: Pulmonary Disease

## 2016-01-24 ENCOUNTER — Encounter: Payer: Self-pay | Admitting: Pulmonary Disease

## 2016-01-24 VITALS — BP 136/62 | HR 54 | Temp 98.0°F | Resp 18 | Ht 62.0 in | Wt 292.3 lb

## 2016-01-24 DIAGNOSIS — I10 Essential (primary) hypertension: Secondary | ICD-10-CM | POA: Diagnosis not present

## 2016-01-24 DIAGNOSIS — Z9884 Bariatric surgery status: Secondary | ICD-10-CM | POA: Diagnosis not present

## 2016-01-24 DIAGNOSIS — I4891 Unspecified atrial fibrillation: Secondary | ICD-10-CM | POA: Diagnosis not present

## 2016-01-24 DIAGNOSIS — Z85038 Personal history of other malignant neoplasm of large intestine: Secondary | ICD-10-CM

## 2016-01-24 DIAGNOSIS — E559 Vitamin D deficiency, unspecified: Secondary | ICD-10-CM

## 2016-01-24 DIAGNOSIS — Z7289 Other problems related to lifestyle: Secondary | ICD-10-CM | POA: Diagnosis not present

## 2016-01-24 DIAGNOSIS — K439 Ventral hernia without obstruction or gangrene: Secondary | ICD-10-CM

## 2016-01-24 DIAGNOSIS — L292 Pruritus vulvae: Secondary | ICD-10-CM | POA: Diagnosis not present

## 2016-01-24 DIAGNOSIS — Z0000 Encounter for general adult medical examination without abnormal findings: Secondary | ICD-10-CM

## 2016-01-24 DIAGNOSIS — Z1231 Encounter for screening mammogram for malignant neoplasm of breast: Secondary | ICD-10-CM

## 2016-01-24 DIAGNOSIS — Z87891 Personal history of nicotine dependence: Secondary | ICD-10-CM | POA: Diagnosis not present

## 2016-01-24 DIAGNOSIS — D72829 Elevated white blood cell count, unspecified: Secondary | ICD-10-CM

## 2016-01-24 DIAGNOSIS — R739 Hyperglycemia, unspecified: Secondary | ICD-10-CM

## 2016-01-24 DIAGNOSIS — E538 Deficiency of other specified B group vitamins: Secondary | ICD-10-CM

## 2016-01-24 DIAGNOSIS — R7303 Prediabetes: Secondary | ICD-10-CM | POA: Diagnosis not present

## 2016-01-24 DIAGNOSIS — Z8541 Personal history of malignant neoplasm of cervix uteri: Secondary | ICD-10-CM

## 2016-01-24 DIAGNOSIS — N760 Acute vaginitis: Secondary | ICD-10-CM

## 2016-01-24 LAB — GLUCOSE, CAPILLARY: Glucose-Capillary: 96 mg/dL (ref 65–99)

## 2016-01-24 LAB — POCT GLYCOSYLATED HEMOGLOBIN (HGB A1C): Hemoglobin A1C: 5.9

## 2016-01-24 MED ORDER — MONTELUKAST SODIUM 10 MG PO TABS: 10.0000 mg | ORAL_TABLET | Freq: Every day | ORAL | Status: DC

## 2016-01-24 MED ORDER — HYDROCHLOROTHIAZIDE 25 MG PO TABS: 25.0000 mg | ORAL_TABLET | Freq: Every day | ORAL | Status: DC

## 2016-01-24 MED ORDER — BUDESONIDE-FORMOTEROL FUMARATE 160-4.5 MCG/ACT IN AERO: 2.0000 | INHALATION_SPRAY | Freq: Two times a day (BID) | RESPIRATORY_TRACT | Status: DC

## 2016-01-24 MED ORDER — ATENOLOL 50 MG PO TABS: 50.0000 mg | ORAL_TABLET | Freq: Every day | ORAL | Status: DC

## 2016-01-24 MED ORDER — OMEPRAZOLE 40 MG PO CPDR: 40.0000 mg | DELAYED_RELEASE_CAPSULE | Freq: Every day | ORAL | Status: DC

## 2016-01-24 MED ORDER — ATORVASTATIN CALCIUM 10 MG PO TABS: 10.0000 mg | ORAL_TABLET | Freq: Every day | ORAL | Status: DC

## 2016-01-24 NOTE — Patient Instructions (Addendum)
Please follow up in 2 to 3 months. We will place referrals for you to see a general surgeon and to see a gastroenterologist. You may use Vagasil or generic for vaginal itching.

## 2016-01-24 NOTE — Progress Notes (Signed)
Subjective:    Patient ID: Suzanne Macdonald, female    DOB: May 04, 1951, 65 y.o.   MRN: [id number]  HPI Suzanne Macdonald is a 65 year old woman with history of atrial fibrillation, HTN, HLD, Colon CA, asthma, gastric bypass presenting for follow up of hypertension and to establish care at [facility]   Moved down here from [place], [place]. Briefly was getting care at [facility] in [place].   She was seen in the ED 01/21/2016 for N/V, abdominal pain. Ventral hernia was reduced. 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  Has intermittent palpitations.  Vaginal itching. No discharge. Has occasional urinary leakage. X 1 year. Some dryness.   Review of Systems Constitutional: no fevers/chills Eyes: no vision changes Ears, nose, mouth, throat, and face: no cough Respiratory: no shortness of breath Cardiovascular: no chest pain Gastrointestinal: no nausea/vomiting, no abdominal pain, no constipation, +diarrhea (last BM yesterday - loose), no hematochezia or melena Genitourinary: no dysuria, no hematuria Integument: no rash Hematologic/lymphatic: no bleeding/bruising, no edema Musculoskeletal: no arthralgias, no myalgias Neurological: no paresthesias, no weakness  Meds: Current Outpatient Prescriptions  Medication Sig Dispense Refill  . albuterol (PROVENTIL HFA;VENTOLIN HFA) 108 (90 Base) MCG/ACT inhaler Inhale 1-2 puffs into the lungs every 6 (six) hours as needed for wheezing or shortness of breath.    Marland Kitchen atenolol (TENORMIN) 50 MG tablet Take 1 tablet (50 mg total) by mouth daily. 30 tablet 1  . atorvastatin (LIPITOR) 10 MG tablet Take 1 tablet (10 mg total) by mouth daily. 30 tablet 1  . budesonide-formoterol (SYMBICORT) 160-4.5 MCG/ACT inhaler Inhale 2 puffs into the lungs 2 (two) times daily. 1 Inhaler 1  . calcium carbonate (TUMS - DOSED IN MG ELEMENTAL CALCIUM) 500 MG chewable tablet Chew 2 tablets by mouth daily.    . cholecalciferol  (VITAMIN D) 1000 units tablet Take 2,000 Units by mouth daily.     . cyanocobalamin 500 MCG tablet Take 500 mcg by mouth daily.     . hydrochlorothiazide (HYDRODIURIL) 25 MG tablet Take 1 tablet (25 mg total) by mouth daily. 30 tablet 1  . montelukast (SINGULAIR) 10 MG tablet Take 1 tablet (10 mg total) by mouth at bedtime. 30 tablet 1  . Multiple Vitamins-Minerals (MULTIVITAMIN WITH MINERALS) tablet Take 1 tablet by mouth daily.    Marland Kitchen omeprazole (PRILOSEC) 40 MG capsule Take 1 capsule (40 mg total) by mouth daily. 30 capsule 1  . zinc gluconate 50 MG tablet Take 50 mg by mouth daily.    . ondansetron (ZOFRAN-ODT) 8 MG disintegrating tablet Take 1 tablet (8 mg total) by mouth every 8 (eight) hours as needed for nausea or vomiting. 10 tablet 0   No current facility-administered medications for this visit.    Allergies: Allergies as of 01/24/2016 - Review Complete 01/24/2016  Allergen Reaction Noted  . Iodine Hives 10/17/2015   Past Medical History  Diagnosis Date  . Hypertension   . Hyperlipidemia   . Asthma   . H/O gastric bypass   . 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 Colon cancer ([place])   . Atrial fibrillation [facility])    Past Surgical History  Procedure Laterality Date  . Bowel resection  2015    Micro, [place] by Dr. Stormy Fabian  . Abdominal hysterectomy  1998    with salpingoophorectomy. For tumorous growth.  . Gastric bypass  1992  . Laparoscopic gastric banding      Placed 2013 and removed in 2014.  Family History  Problem Relation Age of Onset  . Heart disease Mother   . Diabetes Mother   . Heart disease Father   . Stroke Father   . Diabetes Father   . Diabetes Sister   . Thyroid disease Sister   . Early death Brother     Killed by drunk driver  . Thyroid disease Daughter   . Diabetes Sister   . Thyroid disease Sister   . Stroke Brother   . Drug abuse Brother    Social History   Social History  . Marital Status: Married    Spouse Name: N/A  . Number of  Children: N/A  . Years of Education: N/A   Occupational History  . Not on file.   Social History Main Topics  . Smoking status: Former Smoker -- 1.00 packs/day for 30 years    Types: Cigarettes  . Smokeless tobacco: Never Used  . Alcohol Use: No     Comment: a beer every now and then  . Drug Use: No  . Sexual Activity: No   Other Topics Concern  . Not on file   Social History Narrative       Objective:   Physical Exam Blood pressure 136/62, pulse 54, temperature 98 F (36.7 C), temperature source Oral, resp. rate 18, height '5\' 2"'$  (1.575 m), weight 292 lb 4.8 oz (132.586 kg), SpO2 97 %. General Apperance: NAD HEENT: Normocephalic, atraumatic, anicteric sclera Neck: Supple, trachea midline Lungs: Clear to auscultation bilaterally. No wheezes, rhonchi or rales. Breathing comfortably Heart: Regular rate and rhythm, no murmur/rub/gallop Abdomen: Soft, nontender, nondistended, no rebound/guarding Extremities: Warm and well perfused, no edema Skin: No rashes or lesions Neurologic: Alert and interactive. No gross deficits.    Assessment & Plan:  Please refer to problem based charting.

## 2016-01-25 DIAGNOSIS — Z85038 Personal history of other malignant neoplasm of large intestine: Secondary | ICD-10-CM | POA: Insufficient documentation

## 2016-01-25 DIAGNOSIS — D72829 Elevated white blood cell count, unspecified: Secondary | ICD-10-CM | POA: Insufficient documentation

## 2016-01-25 DIAGNOSIS — N760 Acute vaginitis: Secondary | ICD-10-CM | POA: Insufficient documentation

## 2016-01-25 DIAGNOSIS — Z9884 Bariatric surgery status: Secondary | ICD-10-CM | POA: Insufficient documentation

## 2016-01-25 DIAGNOSIS — I480 Paroxysmal atrial fibrillation: Secondary | ICD-10-CM | POA: Insufficient documentation

## 2016-01-25 DIAGNOSIS — Z8541 Personal history of malignant neoplasm of cervix uteri: Secondary | ICD-10-CM | POA: Insufficient documentation

## 2016-01-25 DIAGNOSIS — K439 Ventral hernia without obstruction or gangrene: Secondary | ICD-10-CM | POA: Insufficient documentation

## 2016-01-25 DIAGNOSIS — E559 Vitamin D deficiency, unspecified: Secondary | ICD-10-CM | POA: Insufficient documentation

## 2016-01-25 DIAGNOSIS — I10 Essential (primary) hypertension: Secondary | ICD-10-CM | POA: Insufficient documentation

## 2016-01-25 DIAGNOSIS — Z0000 Encounter for general adult medical examination without abnormal findings: Secondary | ICD-10-CM | POA: Insufficient documentation

## 2016-01-25 DIAGNOSIS — E538 Deficiency of other specified B group vitamins: Secondary | ICD-10-CM | POA: Insufficient documentation

## 2016-01-25 LAB — CBC
HEMATOCRIT: 37.3 % (ref 34.0–46.6)
HEMOGLOBIN: 11.8 g/dL (ref 11.1–15.9)
MCH: 24.3 pg — ABNORMAL LOW (ref 26.6–33.0)
MCHC: 31.6 g/dL (ref 31.5–35.7)
MCV: 77 fL — ABNORMAL LOW (ref 79–97)
PLATELETS: 279 10*3/uL (ref 150–379)
RBC: 4.86 x10E6/uL (ref 3.77–5.28)
RDW: 18.4 % — AB (ref 12.3–15.4)
WBC: 7.3 10*3/uL (ref 3.4–10.8)

## 2016-01-25 LAB — CMP14 + ANION GAP
A/G RATIO: 1.6 (ref 1.2–2.2)
ALT: 30 IU/L (ref 0–32)
ANION GAP: 20 mmol/L — AB (ref 10.0–18.0)
AST: 35 IU/L (ref 0–40)
Albumin: 3.9 g/dL (ref 3.6–4.8)
Alkaline Phosphatase: 94 IU/L (ref 39–117)
BUN/Creatinine Ratio: 21 (ref 12–28)
BUN: 19 mg/dL (ref 8–27)
Bilirubin Total: 0.3 mg/dL (ref 0.0–1.2)
CALCIUM: 8.8 mg/dL (ref 8.7–10.3)
CO2: 19 mmol/L (ref 18–29)
Chloride: 104 mmol/L (ref 96–106)
Creatinine, Ser: 0.89 mg/dL (ref 0.57–1.00)
GFR calc Af Amer: 79 mL/min/{1.73_m2} (ref >59)
GFR, EST NON AFRICAN AMERICAN: 68 mL/min/{1.73_m2} (ref >59)
GLUCOSE: 112 mg/dL — AB (ref 65–99)
Globulin, Total: 2.4 g/dL (ref 1.5–4.5)
POTASSIUM: 4.3 mmol/L (ref 3.5–5.2)
Sodium: 143 mmol/L (ref 134–144)
Total Protein: 6.3 g/dL (ref 6.0–8.5)

## 2016-01-25 LAB — LIPID PANEL
Chol/HDL Ratio: 4.5 ratio units — ABNORMAL HIGH (ref 0.0–4.4)
Cholesterol, Total: 227 mg/dL — ABNORMAL HIGH (ref 100–199)
HDL: 50 mg/dL (ref >39)
LDL Calculated: 151 mg/dL — ABNORMAL HIGH (ref 0–99)
Triglycerides: 131 mg/dL (ref 0–149)
VLDL Cholesterol Cal: 26 mg/dL (ref 5–40)

## 2016-01-25 LAB — VITAMIN B12: Vitamin B-12: 594 pg/mL (ref 211–946)

## 2016-01-25 LAB — VITAMIN D 25 HYDROXY (VIT D DEFICIENCY, FRACTURES): VIT D 25 HYDROXY: 24.2 ng/mL — AB (ref 30.0–100.0)

## 2016-01-25 LAB — HEPATITIS C ANTIBODY: Hep C Virus Ab: <0.1 s/co ratio (ref 0.0–0.9)

## 2016-01-25 NOTE — Assessment & Plan Note (Signed)
Assessment: Vitamin D 24.2  Plan: Continue vitamin D 2000u daily

## 2016-01-25 NOTE — Assessment & Plan Note (Signed)
Assessment: CT abd/pelvis 2/27 with left anterior abdominal wall hernia containing a short segment of the distal transverse colon without definite evidence of obstruction at this site. Presented to ED 6/3 with N/V abdominal pain and ventral hernia reduced.  Plan: Work on weight loss Referral to gen surg for consideration of repair

## 2016-01-25 NOTE — Assessment & Plan Note (Signed)
Assessment: She reports history of atrial fibrillation. She has intermittent palpitations. CHA2DS2-VASc 3.  Plan: Continue atenolol for rate control Obtain records Start ASA '81mg'$  daily Consider obtaining EKG at next visit Discuss anticoagulation at next visit Follow up in 1 month

## 2016-01-25 NOTE — Assessment & Plan Note (Signed)
Assessment: Blood pressure controlled at 136/62.  Plan: Continue atenolol '50mg'$  daily and hydrochlorothiazide '25mg'$  daily.

## 2016-01-25 NOTE — Assessment & Plan Note (Addendum)
Hgb A1c 5.9% Continue to monitor. Work on weight loss.

## 2016-01-25 NOTE — Assessment & Plan Note (Addendum)
Assessment: History of Vitamin B12 deficiency per patient. She is on supplementation. B12 lvl 594.  Plan: Continue B12 500 mcg daily

## 2016-01-25 NOTE — Assessment & Plan Note (Signed)
Obtain records to find out what type of gastric bypass she had. Yearly CBC, CMP, vitamin B12, lipid profile, vitamin D Consider ferritin Consider thiamine, folate, zinc, copper depending on type of surgery and symptoms Continue daily multivitamin

## 2016-01-25 NOTE — Progress Notes (Signed)
Internal Medicine Clinic Attending  Case discussed with Dr. Krall at the time of the visit.  We reviewed the resident's history and exam and pertinent patient test results.  I agree with the assessment, diagnosis, and plan of care documented in the resident's note.  

## 2016-01-25 NOTE — Assessment & Plan Note (Signed)
Assessment: She reports she had cervical cancer in 1990s and had a resection of it. Later had a total abdominal hysterectomy and bilateral salpingo ophorectomy. She is uncertain if she has remaining cervical tissue and last had pap smear 15 years ago.  Plan: Pap smear at next appointment Consider referral to gynecology

## 2016-01-25 NOTE — Assessment & Plan Note (Signed)
Assessment: She reports history of colon cancer with resection in 2015. She has a port still - received chemo. 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  Plan: Obtain records Referral to GI - colonoscopy attempted 9 months ago but was not completed due to poor prep

## 2016-01-25 NOTE — Assessment & Plan Note (Signed)
TDAP: patient reports completed 06/2015 PCV13: patient reports pneumococcal vaccine but uncertain which one she has received. Check records Zostavax: patient reports completed 06/2015 Ordered mammogram today Hep C screen neg

## 2016-01-25 NOTE — Assessment & Plan Note (Addendum)
Assessment: vaginal itching for a year with no discharge. She has occasional urinary leakage and wears a pad. Reports some dryness. Possible atrophic vaginitis given she is post menopausal.   Plan: -Pelvic exam at next visit -Can try over the counter Vagasil or similar for now

## 2016-01-25 NOTE — Assessment & Plan Note (Signed)
Repeat CBC with resolved leukocytosis with WBC 7.3. Hgb normal with low MCV of 77.

## 2016-01-25 NOTE — Addendum Note (Signed)
Addended by: Jacques Earthly T on: 01/25/2016 10:00 AM   Modules accepted: SmartSet

## 2016-01-27 ENCOUNTER — Encounter: Payer: Self-pay | Admitting: Gastroenterology

## 2016-02-01 ENCOUNTER — Telehealth: Payer: Self-pay | Admitting: *Deleted

## 2016-02-01 NOTE — Telephone Encounter (Signed)
This is a medication prescribed to her from her previous PCP. If we can get records that she has been on Advair/Breo before we can let them know? I have not seen any medical records from her old office. Could also try asking her if she has tried either of those inhalers? Thanks

## 2016-02-01 NOTE — Telephone Encounter (Addendum)
Received faxed PA request from pt's pharmacy-symbicort is nonpreferred.  Pt must try and fail  the preferred Adviar and Breo. Per last office note, pt has a dx of asthma (J45.909). Will send info to prescribing MD for review.  Please advise.[name]6/14/201710:36 AM     [employer] Medicare Part D [id number]  PCN# [id number] PT ID# [id number]

## 2016-02-08 DIAGNOSIS — L2389 Allergic contact dermatitis due to other agents: Secondary | ICD-10-CM | POA: Diagnosis not present

## 2016-02-08 DIAGNOSIS — L237 Allergic contact dermatitis due to plants, except food: Secondary | ICD-10-CM | POA: Diagnosis not present

## 2016-02-15 ENCOUNTER — Ambulatory Visit
Admission: RE | Admit: 2016-02-15 | Discharge: 2016-02-15 | Disposition: A | Discharge status: Home / Self Care | Payer: Medicare Other | Source: Ambulatory Visit | Attending: Student in an Organized Health Care Education/Training Program | Admitting: Student in an Organized Health Care Education/Training Program

## 2016-02-15 DIAGNOSIS — Z1231 Encounter for screening mammogram for malignant neoplasm of breast: Secondary | ICD-10-CM

## 2016-02-27 DIAGNOSIS — K432 Incisional hernia without obstruction or gangrene: Secondary | ICD-10-CM | POA: Diagnosis not present

## 2016-03-02 ENCOUNTER — Inpatient Hospital Stay (HOSPITAL_COMMUNITY)
Admission: EM | Admit: 2016-03-02 | Discharge: 2016-03-05 | DRG: 309 | Disposition: A | Discharge status: Home / Self Care | Payer: Medicare Other | Attending: Internal Medicine | Admitting: Internal Medicine

## 2016-03-02 ENCOUNTER — Encounter (HOSPITAL_COMMUNITY): Payer: Self-pay | Admitting: *Deleted

## 2016-03-02 ENCOUNTER — Emergency Department (HOSPITAL_COMMUNITY): Payer: Medicare Other

## 2016-03-02 DIAGNOSIS — Z7951 Long term (current) use of inhaled steroids: Secondary | ICD-10-CM | POA: Diagnosis not present

## 2016-03-02 DIAGNOSIS — I10 Essential (primary) hypertension: Secondary | ICD-10-CM | POA: Diagnosis present

## 2016-03-02 DIAGNOSIS — Z8249 Family history of ischemic heart disease and other diseases of the circulatory system: Secondary | ICD-10-CM

## 2016-03-02 DIAGNOSIS — Z8541 Personal history of malignant neoplasm of cervix uteri: Secondary | ICD-10-CM | POA: Diagnosis not present

## 2016-03-02 DIAGNOSIS — K219 Gastro-esophageal reflux disease without esophagitis: Secondary | ICD-10-CM | POA: Diagnosis present

## 2016-03-02 DIAGNOSIS — R072 Precordial pain: Secondary | ICD-10-CM

## 2016-03-02 DIAGNOSIS — Z87891 Personal history of nicotine dependence: Secondary | ICD-10-CM | POA: Diagnosis not present

## 2016-03-02 DIAGNOSIS — Z9119 Patient's noncompliance with other medical treatment and regimen: Secondary | ICD-10-CM

## 2016-03-02 DIAGNOSIS — J45909 Unspecified asthma, uncomplicated: Secondary | ICD-10-CM | POA: Diagnosis present

## 2016-03-02 DIAGNOSIS — E876 Hypokalemia: Secondary | ICD-10-CM | POA: Diagnosis present

## 2016-03-02 DIAGNOSIS — Z90710 Acquired absence of both cervix and uterus: Secondary | ICD-10-CM

## 2016-03-02 DIAGNOSIS — Z6841 Body Mass Index (BMI) 40.0 and over, adult: Secondary | ICD-10-CM | POA: Diagnosis not present

## 2016-03-02 DIAGNOSIS — G4733 Obstructive sleep apnea (adult) (pediatric): Secondary | ICD-10-CM | POA: Diagnosis not present

## 2016-03-02 DIAGNOSIS — Z9884 Bariatric surgery status: Secondary | ICD-10-CM | POA: Diagnosis not present

## 2016-03-02 DIAGNOSIS — Z9049 Acquired absence of other specified parts of digestive tract: Secondary | ICD-10-CM | POA: Diagnosis not present

## 2016-03-02 DIAGNOSIS — R748 Abnormal levels of other serum enzymes: Secondary | ICD-10-CM | POA: Diagnosis present

## 2016-03-02 DIAGNOSIS — Z85038 Personal history of other malignant neoplasm of large intestine: Secondary | ICD-10-CM

## 2016-03-02 DIAGNOSIS — R0789 Other chest pain: Secondary | ICD-10-CM | POA: Diagnosis present

## 2016-03-02 DIAGNOSIS — E785 Hyperlipidemia, unspecified: Secondary | ICD-10-CM | POA: Diagnosis not present

## 2016-03-02 DIAGNOSIS — I480 Paroxysmal atrial fibrillation: Principal | ICD-10-CM | POA: Diagnosis present

## 2016-03-02 DIAGNOSIS — I4891 Unspecified atrial fibrillation: Secondary | ICD-10-CM

## 2016-03-02 DIAGNOSIS — R079 Chest pain, unspecified: Secondary | ICD-10-CM | POA: Diagnosis not present

## 2016-03-02 DIAGNOSIS — R0602 Shortness of breath: Secondary | ICD-10-CM | POA: Diagnosis not present

## 2016-03-02 DIAGNOSIS — R000 Tachycardia, unspecified: Secondary | ICD-10-CM | POA: Diagnosis not present

## 2016-03-02 HISTORY — DX: Malignant neoplasm of cervix uteri, unspecified: C53.9

## 2016-03-02 LAB — CBC
HCT: 42.7 % (ref 36.0–46.0)
Hemoglobin: 13.5 g/dL (ref 12.0–15.0)
MCH: 25.1 pg — AB (ref 26.0–34.0)
MCHC: 31.6 g/dL (ref 30.0–36.0)
MCV: 79.5 fL (ref 78.0–100.0)
Platelets: 258 10*3/uL (ref 150–400)
RBC: 5.37 MIL/uL — ABNORMAL HIGH (ref 3.87–5.11)
RDW: 16.9 % — AB (ref 11.5–15.5)
WBC: 9 10*3/uL (ref 4.0–10.5)

## 2016-03-02 LAB — BASIC METABOLIC PANEL
Anion gap: 9 (ref 5–15)
BUN: 21 mg/dL — AB (ref 6–20)
CALCIUM: 9.3 mg/dL (ref 8.9–10.3)
CO2: 20 mmol/L — ABNORMAL LOW (ref 22–32)
CREATININE: 0.97 mg/dL (ref 0.44–1.00)
Chloride: 109 mmol/L (ref 101–111)
GFR calc Af Amer: >60 mL/min (ref >60)
GFR, EST NON AFRICAN AMERICAN: 60 mL/min — AB (ref >60)
GLUCOSE: 195 mg/dL — AB (ref 65–99)
Potassium: 4.6 mmol/L (ref 3.5–5.1)
Sodium: 138 mmol/L (ref 135–145)

## 2016-03-02 LAB — I-STAT TROPONIN, ED: TROPONIN I, POC: 0.01 ng/mL (ref 0.00–0.08)

## 2016-03-02 LAB — TROPONIN I: Troponin I: 0.05 ng/mL (ref <0.03)

## 2016-03-02 LAB — BRAIN NATRIURETIC PEPTIDE: B Natriuretic Peptide: 98.9 pg/mL (ref 0.0–100.0)

## 2016-03-02 LAB — TSH: TSH: 1.964 u[IU]/mL (ref 0.350–4.500)

## 2016-03-02 MED ORDER — HEPARIN (PORCINE) IN NACL 100-0.45 UNIT/ML-% IJ SOLN
1100.0000 [IU]/h | INTRAMUSCULAR | Status: DC
  Administered 2016-03-02 – 2016-03-03 (×2): 1100 [IU]/h via INTRAVENOUS
  Filled 2016-03-02 (×2): qty 250

## 2016-03-02 MED ORDER — VITAMIN D 1000 UNITS PO TABS
2000.0000 [IU] | ORAL_TABLET | Freq: Every day | ORAL | Status: DC
  Administered 2016-03-03 – 2016-03-05 (×3): 2000 [IU] via ORAL
  Filled 2016-03-02 (×3): qty 2

## 2016-03-02 MED ORDER — HEPARIN BOLUS VIA INFUSION
4000.0000 [IU] | Freq: Once | INTRAVENOUS | Status: AC
  Administered 2016-03-02: 4000 [IU] via INTRAVENOUS
  Filled 2016-03-02: qty 4000

## 2016-03-02 MED ORDER — NITROGLYCERIN 0.4 MG SL SUBL
0.4000 mg | SUBLINGUAL_TABLET | SUBLINGUAL | Status: DC | PRN
  Administered 2016-03-02: 0.4 mg via SUBLINGUAL
  Filled 2016-03-02: qty 1

## 2016-03-02 MED ORDER — DIPHENHYDRAMINE HCL 25 MG PO CAPS
50.0000 mg | ORAL_CAPSULE | Freq: Once | ORAL | Status: AC
  Administered 2016-03-02: 50 mg via ORAL
  Filled 2016-03-02: qty 2

## 2016-03-02 MED ORDER — RAMELTEON 8 MG PO TABS
8.0000 mg | ORAL_TABLET | Freq: Every day | ORAL | Status: DC
  Administered 2016-03-03 – 2016-03-04 (×3): 8 mg via ORAL
  Filled 2016-03-02 (×4): qty 1

## 2016-03-02 MED ORDER — HYDROCHLOROTHIAZIDE 25 MG PO TABS
12.5000 mg | ORAL_TABLET | Freq: Every day | ORAL | Status: DC
  Administered 2016-03-03 – 2016-03-05 (×3): 12.5 mg via ORAL
  Filled 2016-03-02 (×3): qty 1

## 2016-03-02 MED ORDER — ONDANSETRON 4 MG PO TBDP
8.0000 mg | ORAL_TABLET | Freq: Three times a day (TID) | ORAL | Status: DC | PRN
  Filled 2016-03-02: qty 2

## 2016-03-02 MED ORDER — ATORVASTATIN CALCIUM 10 MG PO TABS
10.0000 mg | ORAL_TABLET | Freq: Every day | ORAL | Status: DC
  Administered 2016-03-03 – 2016-03-04 (×2): 10 mg via ORAL
  Filled 2016-03-02 (×3): qty 1

## 2016-03-02 MED ORDER — ATENOLOL 50 MG PO TABS
50.0000 mg | ORAL_TABLET | Freq: Every day | ORAL | Status: DC
  Administered 2016-03-03 – 2016-03-05 (×3): 50 mg via ORAL
  Filled 2016-03-02: qty 2
  Filled 2016-03-02 (×3): qty 1
  Filled 2016-03-02 (×2): qty 2

## 2016-03-02 MED ORDER — ADULT MULTIVITAMIN W/MINERALS CH
1.0000 | ORAL_TABLET | Freq: Every day | ORAL | Status: DC
  Administered 2016-03-03 – 2016-03-05 (×3): 1 via ORAL
  Filled 2016-03-02 (×3): qty 1

## 2016-03-02 MED ORDER — MONTELUKAST SODIUM 10 MG PO TABS
10.0000 mg | ORAL_TABLET | Freq: Every day | ORAL | Status: DC
  Administered 2016-03-03 – 2016-03-04 (×2): 10 mg via ORAL
  Filled 2016-03-02 (×3): qty 1

## 2016-03-02 MED ORDER — ACETAMINOPHEN 325 MG PO TABS
650.0000 mg | ORAL_TABLET | Freq: Four times a day (QID) | ORAL | Status: DC | PRN
  Administered 2016-03-04 – 2016-03-05 (×2): 650 mg via ORAL
  Filled 2016-03-02 (×2): qty 2

## 2016-03-02 MED ORDER — IOPAMIDOL (ISOVUE-370) INJECTION 76%
INTRAVENOUS | Status: AC
Start: 2016-03-02 — End: 2016-03-03
  Filled 2016-03-02: qty 100

## 2016-03-02 MED ORDER — PANTOPRAZOLE SODIUM 40 MG PO TBEC
40.0000 mg | DELAYED_RELEASE_TABLET | Freq: Every day | ORAL | Status: DC
  Administered 2016-03-03 – 2016-03-05 (×3): 40 mg via ORAL
  Filled 2016-03-02 (×3): qty 1

## 2016-03-02 MED ORDER — MOMETASONE FURO-FORMOTEROL FUM 200-5 MCG/ACT IN AERO
2.0000 | INHALATION_SPRAY | Freq: Two times a day (BID) | RESPIRATORY_TRACT | Status: DC
  Administered 2016-03-03 – 2016-03-05 (×5): 2 via RESPIRATORY_TRACT
  Filled 2016-03-02: qty 8.8

## 2016-03-02 MED ORDER — ACETAMINOPHEN 650 MG RE SUPP: 650.0000 mg | Freq: Four times a day (QID) | RECTAL | Status: DC | PRN

## 2016-03-02 MED ORDER — CYANOCOBALAMIN 500 MCG PO TABS
500.0000 ug | ORAL_TABLET | Freq: Every day | ORAL | Status: DC
  Administered 2016-03-03 – 2016-03-05 (×3): 500 ug via ORAL
  Filled 2016-03-02 (×3): qty 1

## 2016-03-02 MED ORDER — ALBUTEROL SULFATE (2.5 MG/3ML) 0.083% IN NEBU: 2.5000 mg | INHALATION_SOLUTION | Freq: Four times a day (QID) | RESPIRATORY_TRACT | Status: DC | PRN

## 2016-03-02 MED ORDER — HEPARIN SODIUM (PORCINE) 5000 UNIT/ML IJ SOLN: 60.0000 [IU]/kg | Freq: Once | INTRAMUSCULAR | Status: DC

## 2016-03-02 MED ORDER — CALCIUM CARBONATE ANTACID 500 MG PO CHEW
2.0000 | CHEWABLE_TABLET | Freq: Every day | ORAL | Status: DC
  Administered 2016-03-03 – 2016-03-05 (×3): 400 mg via ORAL
  Filled 2016-03-02 (×3): qty 2

## 2016-03-02 NOTE — ED Notes (Signed)
CT notified pt has access for CTA

## 2016-03-02 NOTE — Progress Notes (Signed)
[facility] for heparin  Indication: chest pain/ACS  Allergies  Allergen Reactions  . Iodine Hives    Patient Measurements: Weight: 290 lb (131.543 kg) Heparin Dosing Weight: 83  Vital Signs: Temp: 97.9 F (36.6 C) (07/14 1824) Temp Source: Oral (07/14 1824) BP: 116/50 mmHg (07/14 1930) Pulse Rate: 61 (07/14 1930)  Labs:  Recent Labs  03/02/16 1854  HGB 13.5  HCT 42.7  PLT 258  CREATININE 0.97    Estimated Creatinine Clearance: 75.5 mL/min (by C-G formula based on Cr of 0.97).   Medical History: Past Medical History  Diagnosis Date  . Hypertension   . Hyperlipidemia   . Asthma   . H/O gastric bypass   . Cervical cancer ([place]) 1972  . Colon cancer ([place])   . Atrial fibrillation ([place])   . Sleep apnea     Assessment: 3 yoF with hx of afib admitted with CP and SOB. EKG sx for ST depression. Pharmacy to dose heparin. No known anticoagulation PTA. CBC stable.   Goal of Therapy:  Heparin level 0.3-0.7 units/ml Monitor platelets by anticoagulation protocol: Yes   Plan:  1. Give 4000 units bolus x 1 2. Start heparin infusion at 1100 units/hr 3. Check anti-Xa level in 6 hours and daily while on heparin 4. Continue to monitor H&H and platelets   Vincenza Hews, PharmD, BCPS 03/02/2016, 7:42 PM Pager: [phone]

## 2016-03-02 NOTE — Progress Notes (Addendum)
CRITICAL VALUE ALERT  Critical value received:  Troponin 0.05  Date of notification:  03/02/2013  Time of notification:  2334  Critical value read back: yes  Nurse who received alert:  S. Kris Mouton  MD notified (1st page):  Raina Mina MD  Time of first page:  2336  MD notified (2nd page):  Time of second page:  Responding MD:  Raina Mina MD  Time MD responded: 2337

## 2016-03-02 NOTE — Consult Note (Signed)
Requesting provider: Gay Filler PA-C Primary care provider: Jacques Earthly, MD, [facility] Consulting cardiologist: Dr. Satira Sark  Reason for consultation: Chest pain and atrial fibrillation with RVR  Clinical Summary Suzanne Macdonald is a 65 y.o.female with past medical history outlined below. She moved from [place], [place] about a year ago and was followed briefly at [facility] clinic in [place], recently established in the [facility] in June, I reviewed the office note. She has a history of paroxysmal atrial fibrillation diagnosed approximately 3 years ago. CHADSVASC score is 3, she has not been anticoagulated however. She has been on atenolol, reports that over the last 4 months she has been having increasing episodes of rapid palpitations associated with chest pain radiating up into her arms and neck. Recently these episodes have occurred almost every other day, lasting sometimes up to an hour. This is the first time she sought urgent evaluation.  At presentation heart rate was in the 170s in rapid atrial fibrillation. ECG with this event showed significant inferolateral ST segment depression with ST elevation in aVR. She did convert spontaneously to sinus rhythm with follow-up ECG showing improving ST segment changes. With this she started to feel significantly better, and did not report any chest pain on my examination. She states that this is typical for her episodes of atrial fibrillation. Initial troponin I level is 0.01.  She states that she underwent a cardiac catheterization in [place] 2 years ago prior to undergoing colon cancer surgery. She recalls being told that she had no significant blockages in her coronary arteries.   Allergies  Allergen Reactions  . Iodine Hives    Home Medications No current facility-administered medications on file prior to encounter.   Current Outpatient Prescriptions on File Prior  to Encounter  Medication Sig Dispense Refill  . albuterol (PROVENTIL HFA;VENTOLIN HFA) 108 (90 Base) MCG/ACT inhaler Inhale 1-2 puffs into the lungs every 6 (six) hours as needed for wheezing or shortness of breath.    Marland Kitchen atenolol (TENORMIN) 50 MG tablet Take 1 tablet (50 mg total) by mouth daily. 30 tablet 1  . atorvastatin (LIPITOR) 10 MG tablet Take 1 tablet (10 mg total) by mouth daily. 30 tablet 1  . budesonide-formoterol (SYMBICORT) 160-4.5 MCG/ACT inhaler Inhale 2 puffs into the lungs 2 (two) times daily. 1 Inhaler 1  . calcium carbonate (TUMS - DOSED IN MG ELEMENTAL CALCIUM) 500 MG chewable tablet Chew 2 tablets by mouth daily.    . cholecalciferol (VITAMIN D) 1000 units tablet Take 2,000 Units by mouth daily.     . cyanocobalamin 500 MCG tablet Take 500 mcg by mouth daily.     . hydrochlorothiazide (HYDRODIURIL) 25 MG tablet Take 1 tablet (25 mg total) by mouth daily. 30 tablet 1  . montelukast (SINGULAIR) 10 MG tablet Take 1 tablet (10 mg total) by mouth at bedtime. 30 tablet 1  . Multiple Vitamins-Minerals (MULTIVITAMIN WITH MINERALS) tablet Take 1 tablet by mouth daily.    Marland Kitchen omeprazole (PRILOSEC) 40 MG capsule Take 1 capsule (40 mg total) by mouth daily. 30 capsule 1  . ondansetron (ZOFRAN-ODT) 8 MG disintegrating tablet Take 1 tablet (8 mg total) by mouth every 8 (eight) hours as needed for nausea or vomiting. 10 tablet 0  . zinc gluconate 50 MG tablet Take 50 mg by mouth daily.      Past Medical History  Diagnosis Date  . Hypertension   . Hyperlipidemia   . Asthma   .  H/O gastric bypass   . Cervical cancer ([place]) 1972  . Colon cancer ([place])   . Atrial fibrillation ([place])   . Sleep apnea     Past Surgical History  Procedure Laterality Date  . Bowel resection  2015    [place], [place] by Dr. Stormy Fabian  . Abdominal hysterectomy  1998    Salpingoophorectomy. For tumorous growth.  . Gastric bypass  1992  . Laparoscopic gastric banding      Placed 2013 and removed in 2014.     Family History  Problem Relation Age of Onset  . Heart disease Mother   . Diabetes Mother   . Heart disease Father   . Stroke Father   . Diabetes Father   . Diabetes Sister   . Thyroid disease Sister   . Early death Brother     Killed by drunk driver  . Thyroid disease Daughter   . Diabetes Sister   . Thyroid disease Sister   . Stroke Brother   . Drug abuse Brother     Social History Suzanne Macdonald reports that she has quit smoking. Her smoking use included Cigarettes. She has a 30 pack-year smoking history. She has never used smokeless tobacco. Suzanne Macdonald reports that she does not drink alcohol.  Review of Systems Complete review of systems negative except as otherwise outlined in the clinical summary and also the following. Sedentary. No reproducible exertional chest pain.  Physical Examination Blood pressure 116/50, pulse 61, temperature 97.9 F (36.6 C), temperature source Oral, resp. rate 16, weight 290 lb (131.543 kg), SpO2 96 %. No intake or output data in the 24 hours ending 03/02/16 2016  Telemetry: Sinus rhythm at 60 bpm.  Gen.: Morbidly obese woman in no distress. HEENT: Conjunctiva and lids normal, oropharynx clear. Neck: Supple, increased girth without obvious elevated JVP or carotid bruits, no thyromegaly. Lungs: Scattered rhonchi, nonlabored breathing at rest. Cardiac: Regular rate and rhythm, no S3, soft systolic murmur, no pericardial rub. Abdomen: Obese, nontender, bowel sounds present, no guarding or rebound. Extremities: No pitting edema, distal pulses 2+. Skin: Warm and dry. Musculoskeletal: No kyphosis. Neuropsychiatric: Alert and oriented x3, affect grossly appropriate.  Lab Results  Basic Metabolic Panel:  Recent Labs Lab 03/02/16 1854  NA 138  K 4.6  CL 109  CO2 20*  GLUCOSE 195*  BUN 21*  CREATININE 0.97  CALCIUM 9.3    CBC:  Recent Labs Lab 03/02/16 1854  WBC 9.0  HGB 13.5  HCT 42.7  MCV 79.5  PLT 258    Cardiac  Enzymes: POC Troponin I 0.01  BNP: 98  Imaging Chest x-ray 03/02/2016: FINDINGS: Upper limits normal heart size again noted.  Mild pulmonary vascular congestion is present.  A right-sided Port-A-Cath is again identified with tip overlying the mid SVC.  There is no evidence of focal airspace disease, pulmonary edema, suspicious pulmonary nodule/mass, pleural effusion, or pneumothorax.  No acute bony abnormalities are identified.  IMPRESSION: Pulmonary vascular congestion.  Impression  1. Symptomatic rapid atrial fibrillation, spontaneously converted to sinus rhythm. Symptoms concerning for unstable angina and her ST segment changes were impressive with RVR, however initial troponin I is normal and she reports a normal cardiac catheterization just 2 years ago in [place]. She has had increasing episodes of atrial fibrillation in the last 4 months. CHADSVASC score is 3. She reports compliance with atenolol and heart rate is 60 in sinus rhythm now.  2. Obesity, status post gastric bypass surgery in 1992.  3. Essential hypertension,  recent blood pressure normal.  4. History of hyperlipidemia, on Lipitor. Recent lipid panel showed total cholesterol 227, HDL 50, LDL 151.  Recommendations  Discussed with ER staff. Patient is to be evaluated by the [facility] for admission to the hospital. Would recommend cycling cardiac markers and obtain an echocardiogram to assess cardiac structure and function. Continue atenolol and heparin for now (started in ER). Would try and obtain her cardiac catheterization report from [place] to help clarify the situation. If in fact she did have normal coronary arteries just 2 years ago, unstable angina is much less likely, particularly if her subsequent cardiac markers are normal. She may just be having frequent episodes of symptomatic atrial fibrillation, and antiarrhythmic therapy would be an option in addition to her  atenolol. She should also be considered for stroke prophylaxis with DOAC instead of aspirin in light of CHADSVASC score of 3. Check TSH, keep on telemetry. Eventually consider assessment for potential obstructive sleep apnea as well. Our cardiology service will follow with you. Patient lives in [place] (near [place]) and can be established with one of our providers in the [facility] office for follow-up of atrial fibrillation.  Satira Sark, M.D., F.A.C.C.

## 2016-03-02 NOTE — ED Notes (Signed)
Pt transported to CT ?

## 2016-03-02 NOTE — ED Notes (Signed)
Radiology at bedside

## 2016-03-02 NOTE — ED Notes (Signed)
Pt states she has been going in and out of afib for the last month.  Pt has sob, chest pain, and pain down right arm.  HR in the 150-170s

## 2016-03-02 NOTE — ED Provider Notes (Signed)
CSN: [id number]     Arrival date & time 03/02/16  1817 History   First MD Initiated Contact with Patient 03/02/16 1832     Chief Complaint  Patient presents with  . Chest Pain  . Shortness of Breath  . Irregular Heart Beat     (Consider location/radiation/quality/duration/timing/severity/associated sxs/prior Treatment) HPI Comments: Suzanne Macdonald is a 65 y.o. female with h/o atrial fibrillation not currently on anticoagulant therapy, HTN, prediabetes, h/o colon cancer, h/o cervical cancer, and asthma presents to ED with complaint of chest pain, palpitations, and SOB. Patient endorses she has been intermittently in atrial fibrillation over the last month and has associated chest pain and SOB with sxs. Reports she was tachycardic at home in 120s with palpitations. She is actively having chest pain with radiation down left arm, 7/10, described as a pressure sensation. She has associated shortness of breath and nausea. She took a BC powder PTA. Denies fever, chills, night sweats, cough, wheezing, lower extremity swelling, vomiting, abdominal pain, diarrhea, constipation, dizziness, or lightheadedness. Of note patient has a history of cancer. 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 She could not complete treatment of colon cancer secondary to side effects of chemotherapy. She denies any recent long distance travel/surgeries/immobilizations. No h/o blood clots. She has recently moved from [place] and her PCP is Dr. Windell Hummingbird.   Patient is a 65 y.o. female presenting with chest pain and shortness of breath. The history is provided by the patient.  Chest Pain Associated symptoms: nausea, palpitations and shortness of breath   Associated symptoms: no abdominal pain, no cough, no diaphoresis, no dizziness, no fever and not vomiting   Shortness of Breath Associated symptoms: chest pain   Associated symptoms: no abdominal pain, no  cough, no diaphoresis, no fever, no neck pain, no rash, no sore throat, no vomiting and no wheezing     Past Medical History  Diagnosis Date  . Hypertension   . Hyperlipidemia   . Asthma   . H/O gastric bypass   . Cervical cancer ([place]) 1972  . Colon cancer ([place])   . Atrial fibrillation ([place])   . Sleep apnea    Past Surgical History  Procedure Laterality Date  . Bowel resection  2015    [place], [place] by Dr. Stormy Fabian  . Abdominal hysterectomy  1998    Salpingoophorectomy. For tumorous growth.  . Gastric bypass  1992  . Laparoscopic gastric banding      Placed 2013 and removed in 2014.   Family History  Problem Relation Age of Onset  . Heart disease Mother   . Diabetes Mother   . Heart disease Father   . Stroke Father   . Diabetes Father   . Diabetes Sister   . Thyroid disease Sister   . Early death Brother     Killed by drunk driver  . Thyroid disease Daughter   . Diabetes Sister   . Thyroid disease Sister   . Stroke Brother   . Drug abuse Brother    Social History  Substance Use Topics  . Smoking status: Former Smoker -- 1.00 packs/day for 30 years    Types: Cigarettes  . Smokeless tobacco: Never Used  . Alcohol Use: No     Comment: a beer every now and then   OB History    No data available     Review of Systems  Constitutional: Negative for fever, chills and  diaphoresis.  HENT: Negative for sore throat.   Eyes: Negative for visual disturbance.  Respiratory: Positive for shortness of breath. Negative for cough and wheezing.   Cardiovascular: Positive for chest pain and palpitations. Negative for leg swelling.  Gastrointestinal: Positive for nausea. Negative for vomiting, abdominal pain, diarrhea, constipation and blood in stool.  Genitourinary: Negative for dysuria and hematuria.  Musculoskeletal: Negative for neck pain.  Skin: Negative for rash.  Neurological: Positive for light-headedness. Negative for dizziness.      Allergies  Iodine  Home  Medications   Prior to Admission medications   Medication Sig Start Date End Date Taking? Authorizing Provider  albuterol (PROVENTIL HFA;VENTOLIN HFA) 108 (90 Base) MCG/ACT inhaler Inhale 1-2 puffs into the lungs every 6 (six) hours as needed for wheezing or shortness of breath.    Historical Provider, MD  atenolol (TENORMIN) 50 MG tablet Take 1 tablet (50 mg total) by mouth daily. 01/24/16   Milagros Loll, MD  atorvastatin (LIPITOR) 10 MG tablet Take 1 tablet (10 mg total) by mouth daily. 01/24/16   Milagros Loll, MD  budesonide-formoterol [facility]) 160-4.5 MCG/ACT inhaler Inhale 2 puffs into the lungs 2 (two) times daily. 01/24/16   Milagros Loll, MD  calcium carbonate (TUMS - DOSED IN MG ELEMENTAL CALCIUM) 500 MG chewable tablet Chew 2 tablets by mouth daily.    Historical Provider, MD  cholecalciferol (VITAMIN D) 1000 units tablet Take 2,000 Units by mouth daily.     Historical Provider, MD  cyanocobalamin 500 MCG tablet Take 500 mcg by mouth daily.     Historical Provider, MD  hydrochlorothiazide (HYDRODIURIL) 25 MG tablet Take 1 tablet (25 mg total) by mouth daily. 01/24/16   Milagros Loll, MD  montelukast (SINGULAIR) 10 MG tablet Take 1 tablet (10 mg total) by mouth at bedtime. 01/24/16   Milagros Loll, MD  Multiple Vitamins-Minerals (MULTIVITAMIN WITH MINERALS) tablet Take 1 tablet by mouth daily.    Historical Provider, MD  omeprazole (PRILOSEC) 40 MG capsule Take 1 capsule (40 mg total) by mouth daily. 01/24/16   Milagros Loll, MD  ondansetron (ZOFRAN-ODT) 8 MG disintegrating tablet Take 1 tablet (8 mg total) by mouth every 8 (eight) hours as needed for nausea or vomiting. 01/22/16   Davonna Belling, MD  zinc gluconate 50 MG tablet Take 50 mg by mouth daily.    Historical Provider, MD   BP 116/50 mmHg  Pulse 61  Temp(Src) 97.9 F (36.6 C) (Oral)  Resp 16  Wt 131.543 kg  SpO2 96% Physical Exam  Constitutional: She appears well-developed and well-nourished. She appears  distressed.  HENT:  Head: Normocephalic and atraumatic.  Mouth/Throat: Oropharynx is clear and moist. No oropharyngeal exudate.  Eyes: Conjunctivae and EOM are normal. Pupils are equal, round, and reactive to light. Right eye exhibits no discharge. Left eye exhibits no discharge. No scleral icterus.  Neck: Normal range of motion. Neck supple.  Cardiovascular: Normal heart sounds and intact distal pulses.  An irregularly irregular rhythm present. Tachycardia present.   No murmur heard. Pulmonary/Chest: Tachypnea noted. No respiratory distress. She has decreased breath sounds ( diffuse). She has no wheezes.  Abdominal: Soft. Bowel sounds are normal. There is no tenderness. There is no rebound and no guarding.  Musculoskeletal: Normal range of motion.  No lower extremity edema noted.   Lymphadenopathy:    She has no cervical adenopathy.  Neurological: She is alert. Coordination normal.  Skin: Skin is warm and dry. She is not diaphoretic.  Psychiatric: She has a normal mood and affect.    ED Course  Procedures (including critical care time) Labs Review Labs Reviewed  BASIC METABOLIC PANEL - Abnormal; Notable for the following:    CO2 20 (*)    Glucose, Bld 195 (*)    BUN 21 (*)    GFR calc non Af Amer 60 (*)    All other components within normal limits  CBC - Abnormal; Notable for the following:    RBC 5.37 (*)    MCH 25.1 (*)    RDW 16.9 (*)    All other components within normal limits  BRAIN NATRIURETIC PEPTIDE  HEPARIN LEVEL (UNFRACTIONATED)  HIV ANTIBODY (ROUTINE TESTING)  TSH  I-STAT TROPOININ, ED    Imaging Review Dg Chest Portable 1 View  03/02/2016  CLINICAL DATA:  Acute shortness of breath and chest pain. EXAM: PORTABLE CHEST 1 VIEW COMPARISON:  01/22/2016 radiographs FINDINGS: Upper limits normal heart size again noted. Mild pulmonary vascular congestion is present. A right-sided Port-A-Cath is again identified with tip overlying the mid SVC. There is no evidence of  focal airspace disease, pulmonary edema, suspicious pulmonary nodule/mass, pleural effusion, or pneumothorax. No acute bony abnormalities are identified. IMPRESSION: Pulmonary vascular congestion. Electronically Signed   By: Margarette Canada M.D.   On: 03/02/2016 19:10   I have personally reviewed and evaluated these images and lab results as part of my medical decision-making.   EKG Interpretation   Date/Time:  Friday March 02 2016 18:47:19 EDT Ventricular Rate:  75 PR Interval:    QRS Duration: 81 QT Interval:  376 QTC Calculation: 420 R Axis:   69 Text Interpretation:  Sinus rhythm Minimal ST depression, inferior leads  New ST segment depression Confirmed by NGUYEN, EMILY ([id number]) on 03/02/2016  7:10:31 PM      Filed Vitals:   03/02/16 1846 03/02/16 1902 03/02/16 1907 03/02/16 1930  BP:  114/52  116/50  Pulse: 75 66  61  Temp:      TempSrc:      Resp: '13 22  16  '$ Weight:   131.543 kg   SpO2: 96% 96%  96%     MDM   Final diagnoses:  Chest pain, unspecified chest pain type  Atrial fibrillation with tachycardic ventricular rate ([place])    Patient is afebrile with active chest pain. She took [facility] powder PTA. She is tachycardic in 160s with irregularly irregular rhythm and tachypneic. Sublingual NTG given. Physical exam otherwise unremarkable. During exam patient spontaneously converted to NSR. EKG shows minimal ST depression in inferior and lateral leads. Initial troponin negative. Heart Score 6. CBC and BMP unremarkable. CXR shows pulmonary vascular congestion; however, BNP normal. Concern for pulmonary embolus vs. ACS vs. ?symptomatic atrial fibrillation. Heparin initiated. Pt declined CTA - ?consider V/Q scan. Consult to cardiology regarding EKG changes.   8:00 PM: Spoke with cardiology, appreciated his input and time. During his discussion with patient, pt stated she had a heart catheterization approximately 2 years ago that was normal. Recommended admission to hospitalist for further  evaluation and management of atrial fibrillation and chest pain - serial troponins, echo, ?antiarrhythmic addition considering frequency of paroxysmal a.fib.   8:07 PM: Consult to internal medicine, appreciated their time and input. Will admit for further evaluation and management of atrial fibrillation.    Roxanna Mew, PA-C 03/02/16 2256  Harvel Quale, MD 03/12/16 [phone]

## 2016-03-02 NOTE — ED Notes (Signed)
Spoke to main lab, they have blood to run BNP

## 2016-03-02 NOTE — H&P (Signed)
Date: 03/02/2016               Patient Name:  Suzanne Macdonald MRN: [id number]  DOB: 28-May-1951 Age / Sex: 65 y.o., female   PCP: Shela Leff, MD         Medical Service: Internal Medicine Teaching Service         Attending Physician: Dr. Oval Linsey, MD    First Contact: Dr. Heber [place] Pager: [phone]  Second Contact: Dr. Arcelia Jew Pager: [phone]       After Hours (After 5p/  First Contact Pager: [phone]  weekends / holidays): Second Contact Pager: [phone]   Chief Complaint: "My A Fib is getting worse."  History of Present Illness: Ms Frane is a 65 year old woman with history of paroxsysmal AFib (Dx 3 years ago, no anticoagulation), HTN, HL, OSA, gastric bypass, colon cancer (s/p resection), and cervical cancer who presents with episodic palpitations, chest pain, and dyspnea of increasing frequency in past months.  Two-three years ago, Ms Opdahl was diagnosed with paroxsysmal atrial fibrillation by cardiologist Veatrice Kells, [facility], in [place], [place].  She would have episodes of palpitations, chest pain, and dyspnea 4-5 times a year which would resolve spontaneously.  She was started on a baby aspirin and atenolol, but no anticoagulation.  Since moving to [place], these episodes have become more frequent, now happening almost every other day and lasting unpredictable amounts of time.  Only has dyspnea and chest pain at the same time as palpitations.  No exertional association, though she is not very mobile or active due to obesity and a knee injury.    On the day of admission, she had another episode of palpitations with chest pressure that radiated to her left arm, leading her to present to the [facility] ED.  There an EKG showed A Fib with RVR and rates in 170s  ROS positive for PND and orthopnea.  No LE edema  She drinks 2 cups of coffee and 2 Coke Zeros per day, as well as taking BC Powders daily.    Per her recollection, she had a left heart  cath by Dr Rip Harbour in 04/2014 which was "normal".  She has had multiple prior hospitalizations for A Fib w/ RVR and also for hypokalemia.   Meds: Current Facility-Administered Medications  Medication Dose Route Frequency Provider Last Rate Last Dose  . acetaminophen (TYLENOL) tablet 650 mg  650 mg Oral Q6H PRN Tasrif Ahmed, MD       Or  . acetaminophen (TYLENOL) suppository 650 mg  650 mg Rectal Q6H PRN Tasrif Ahmed, MD      . albuterol (PROVENTIL HFA;VENTOLIN HFA) 108 (90 Base) MCG/ACT inhaler 1-2 puff  1-2 puff Inhalation Q6H PRN Tasrif Ahmed, MD      . atenolol (TENORMIN) tablet 50 mg  50 mg Oral Daily Tasrif Ahmed, MD      . atorvastatin (LIPITOR) tablet 10 mg  10 mg Oral Daily Tasrif Ahmed, MD      . calcium carbonate (TUMS - dosed in mg elemental calcium) chewable tablet 400 mg of elemental calcium  2 tablet Oral Daily Tasrif Ahmed, MD      . cholecalciferol (VITAMIN D) tablet 2,000 Units  2,000 Units Oral Daily Tasrif Ahmed, MD      . cyanocobalamin tablet 500 mcg  500 mcg Oral Daily Tasrif Ahmed, MD      . heparin ADULT infusion 100 units/mL (25000 units/229m sodium chloride 0.45%)  1,100 Units/hr Intravenous Continuous  Harvel Quale, MD 11 mL/hr at 03/02/16 1942 1,100 Units/hr at 03/02/16 1942  . hydrochlorothiazide (HYDRODIURIL) tablet 12.5 mg  12.5 mg Oral Daily Tasrif Ahmed, MD      . iopamidol (ISOVUE-370) 76 % injection           . mometasone-formoterol (DULERA) 200-5 MCG/ACT inhaler 2 puff  2 puff Inhalation BID Tasrif Ahmed, MD      . montelukast (SINGULAIR) tablet 10 mg  10 mg Oral QHS Tasrif Ahmed, MD      . multivitamin with minerals tablet 1 tablet  1 tablet Oral Daily Tasrif Ahmed, MD      . nitroGLYCERIN (NITROSTAT) SL tablet 0.4 mg  0.4 mg Sublingual Q5 min PRN Roxanna Mew, PA-C   0.4 mg at 03/02/16 1902  . ondansetron (ZOFRAN-ODT) disintegrating tablet 8 mg  8 mg Oral Q8H PRN Tasrif Ahmed, MD      . pantoprazole (PROTONIX) EC tablet 40 mg  40 mg Oral Daily  Tasrif Ahmed, MD       Current Outpatient Prescriptions  Medication Sig Dispense Refill  . albuterol (PROVENTIL HFA;VENTOLIN HFA) 108 (90 Base) MCG/ACT inhaler Inhale 1-2 puffs into the lungs every 6 (six) hours as needed for wheezing or shortness of breath.    Marland Kitchen atenolol (TENORMIN) 50 MG tablet Take 1 tablet (50 mg total) by mouth daily. 30 tablet 1  . atorvastatin (LIPITOR) 10 MG tablet Take 1 tablet (10 mg total) by mouth daily. 30 tablet 1  . budesonide-formoterol (SYMBICORT) 160-4.5 MCG/ACT inhaler Inhale 2 puffs into the lungs 2 (two) times daily. 1 Inhaler 1  . calcium carbonate (TUMS - DOSED IN MG ELEMENTAL CALCIUM) 500 MG chewable tablet Chew 2 tablets by mouth daily.    . cholecalciferol (VITAMIN D) 1000 units tablet Take 2,000 Units by mouth daily.     . cyanocobalamin 500 MCG tablet Take 500 mcg by mouth daily.     . hydrochlorothiazide (HYDRODIURIL) 25 MG tablet Take 1 tablet (25 mg total) by mouth daily. 30 tablet 1  . montelukast (SINGULAIR) 10 MG tablet Take 1 tablet (10 mg total) by mouth at bedtime. 30 tablet 1  . Multiple Vitamins-Minerals (MULTIVITAMIN WITH MINERALS) tablet Take 1 tablet by mouth daily.    Marland Kitchen omeprazole (PRILOSEC) 40 MG capsule Take 1 capsule (40 mg total) by mouth daily. 30 capsule 1  . ondansetron (ZOFRAN-ODT) 8 MG disintegrating tablet Take 1 tablet (8 mg total) by mouth every 8 (eight) hours as needed for nausea or vomiting. 10 tablet 0  . zinc gluconate 50 MG tablet Take 50 mg by mouth daily.      Allergies: Allergies as of 03/02/2016 - Review Complete 03/02/2016  Allergen Reaction Noted  . Iodine Hives 10/17/2015   Past Medical History  Diagnosis Date  . Hypertension   . Hyperlipidemia   . Asthma   . H/O gastric bypass   . Cervical cancer ([place]) 1972  . Colon cancer ([place])   . Atrial fibrillation ([place])   . Sleep apnea     Family History: Mother died of MI in 85s, father died of MI in 61s.  Brother with stroke, DM and brother with CAD, DM.   Sisters and daughter with thyroid disease.  Social History: Former smoker (quit 2012, ~60 pack year history).  Rare alcohol.  No drugs.  Review of Systems: A complete ROS was negative except as per HPI.  Physical Exam: Blood pressure 116/50, pulse 61, temperature 97.9 F (36.6 C), temperature source Oral,  resp. rate 16, weight 290 lb (131.543 kg), SpO2 96 %.  Physical Exam  Constitutional: She is oriented to person, place, and time. She appears well-developed and well-nourished. No distress.  HENT:  Head: Normocephalic and atraumatic.  Mouth/Throat: Oropharynx is clear and moist.  Eyes: Conjunctivae are normal. Pupils are equal, round, and reactive to light. No scleral icterus.  Neck:  JVP 10 cm, positive hepatojugular reflux  Cardiovascular: Normal rate, regular rhythm, normal heart sounds and intact distal pulses.  Exam reveals no gallop and no friction rub.   No murmur heard. Pulmonary/Chest: Effort normal and breath sounds normal. No respiratory distress. She has no wheezes. She has no rales. She exhibits no tenderness.  Abdominal: Soft. She exhibits no distension. There is no tenderness.  Musculoskeletal: Normal range of motion. She exhibits no edema or tenderness.  Lymphadenopathy:    She has no cervical adenopathy.  Neurological: She is alert and oriented to person, place, and time. No cranial nerve deficit. Coordination normal.  Skin: Skin is warm and dry. She is not diaphoretic. No erythema.  Psychiatric: She has a normal mood and affect. Her behavior is normal. Judgment and thought content normal.    EKG  1824: AFib with RVR (rate 170), widespread ST depressions across inferolateral leads,  1847: NSR, ST depression in inferolateral leads (II, III, aVF), no TWI, no Q waves  CXR (AP): Pulmonary vascular congestion  CBC Latest Ref Rng 03/02/2016 01/24/2016 01/21/2016  WBC 4.0 - 10.5 K/uL 9.0 7.3 14.0(H)  Hemoglobin 12.0 - 15.0 g/dL 13.5 - 12.0  Hematocrit 36.0 - 46.0 % 42.7  37.3 40.1  Platelets 150 - 400 K/uL 258 279 287   BMP Latest Ref Rng 03/02/2016 01/24/2016 01/21/2016  Glucose 65 - 99 mg/dL 195(H) 112(H) 161(H)  BUN 6 - 20 mg/dL 21(H) 19 20  Creatinine 0.44 - 1.00 mg/dL 0.97 0.89 0.88  BUN/Creat Ratio 12 - 28 - 21 -  Sodium 135 - 145 mmol/L 138 143 142  Potassium 3.5 - 5.1 mmol/L 4.6 4.3 3.3(L)  Chloride 101 - 111 mmol/L 109 104 110  CO2 22 - 32 mmol/L 20(L) 19 21(L)  Calcium 8.9 - 10.3 mg/dL 9.3 8.8 9.0    Troponin (Point of Care Test)  Recent Labs  03/02/16 1921  TROPIPOC 0.01   Cardiac Panel (last 3 results)  Recent Labs  03/02/16 2251  TROPONINI 0.05*    BNP    Component Value Date/Time   BNP 98.9 03/02/2016 1854   TSH 1.964   Assessment & Plan by Problem: Active Problems:   Essential hypertension   Atrial fibrillation (HCC)   Chest pain   Asthma  #Paroxsysmal AFib Intermittent palpitations, dyspnea, and chest discomfort in patient with known paroxysmal atrial fibrillation who was in AFib with RVR at presentation.  Chest discomfort and EKG changes likely due to demand ischemia, but ACS (unstable angina) is a consideration.  With repeated symptoms, PE is unlikely ([place] 1.5, 1.3% chance of PE). Normal electrolytes, no anemia, normal TSH, but consumes larges amounts of caffeine. With her HTN, OSA, and worsening AFib, she is at risk of CHF and we do not know if she had previous echo.  CHADS2-VASc of 3, candidate for anticoagulation. -Telemetry -Continue Heparin -Continue home atenolol -Echo -Trend tropinins -TSH -Initiate DOAC -Obtain LHC report from Dr Rip Harbour -Reduce caffeine intake, discontinue BC powders -Appreciate Cardiology recommendations for possible chronic antiarrhythmics  #OSA No home CPAP/BiPAP due to cost.  Reports sleep study ~5 years ago positive for OSA. -CPAP  #  HTN #HL #Asthma #GERD -Continue home meds    Dispo: Admit patient to Inpatient with expected length of stay greater than 2  midnights.  Signed: Minus Liberty, MD 03/02/2016, 8:08 PM  Pager: [phone]

## 2016-03-03 DIAGNOSIS — G4733 Obstructive sleep apnea (adult) (pediatric): Secondary | ICD-10-CM

## 2016-03-03 LAB — BASIC METABOLIC PANEL WITH GFR
Anion gap: 8 (ref 5–15)
BUN: 17 mg/dL (ref 6–20)
CO2: 25 mmol/L (ref 22–32)
Calcium: 8.9 mg/dL (ref 8.9–10.3)
Chloride: 108 mmol/L (ref 101–111)
Creatinine, Ser: 0.92 mg/dL (ref 0.44–1.00)
GFR calc Af Amer: >60 mL/min (ref >60)
GFR calc non Af Amer: >60 mL/min (ref >60)
Glucose, Bld: 115 mg/dL — ABNORMAL HIGH (ref 65–99)
Potassium: 3.7 mmol/L (ref 3.5–5.1)
Sodium: 141 mmol/L (ref 135–145)

## 2016-03-03 LAB — TROPONIN I
TROPONIN I: 0.03 ng/mL — AB (ref <0.03)
Troponin I: 0.05 ng/mL (ref <0.03)

## 2016-03-03 LAB — HEPARIN LEVEL (UNFRACTIONATED): Heparin Unfractionated: 0.47 IU/mL (ref 0.30–0.70)

## 2016-03-03 LAB — HIV ANTIBODY (ROUTINE TESTING W REFLEX): HIV SCREEN 4TH GENERATION: NONREACTIVE

## 2016-03-03 MED ORDER — APIXABAN 5 MG PO TABS
5.0000 mg | ORAL_TABLET | Freq: Two times a day (BID) | ORAL | Status: DC
  Administered 2016-03-03 – 2016-03-05 (×5): 5 mg via ORAL
  Filled 2016-03-03 (×5): qty 1

## 2016-03-03 NOTE — Progress Notes (Signed)
Internal Medicine Attending  Date: 03/03/2016  Patient name: Suzanne Macdonald Medical record number: [id number] Date of birth: Nov 11, 1950 Age: 65 y.o. Gender: female  I saw and evaluated the patient. I reviewed the resident's note by Dr. Young Berry and I agree with the resident's findings and plans as documented in her progress note.  Please see my H&P dated 03/03/2016 and attached to Dr. Rowe Pavy H&P dated 03/02/2016 for specifics of my evaluation, assessment, and plan from earlier today.

## 2016-03-03 NOTE — Progress Notes (Signed)
ANTICOAGULATION CONSULT NOTE - Initial Consult  Pharmacy Consult for Heparin >> Apixaban Indication: atrial fibrillation  Allergies  Allergen Reactions  . Iodine Hives    Patient Measurements: Height: '5\' 2"'$  (157.5 cm) Weight: 290 lb 1.6 oz (131.588 kg) IBW/kg (Calculated) : 50.1  Vital Signs: Temp: 97.7 F (36.5 C) (07/15 0440) Temp Source: Oral (07/15 0440) BP: 117/55 mmHg (07/15 0808) Pulse Rate: 57 (07/15 1003)  Labs:  Recent Labs  03/02/16 1854 03/02/16 2251 03/03/16 0416 03/03/16 0417  HGB 13.5  --   --   --   HCT 42.7  --   --   --   PLT 258  --   --   --   HEPARINUNFRC  --   --   --  0.47  CREATININE 0.97  --  0.92  --   TROPONINI  --  0.05*  --  0.05*    Estimated Creatinine Clearance: 79.6 mL/min (by C-G formula based on Cr of 0.92).   Medical History: Past Medical History  Diagnosis Date  . Hypertension   . Hyperlipidemia   . Asthma   . H/O gastric bypass   . Cervical cancer ([place]) 1972  . Colon cancer ([place])   . Atrial fibrillation ([place])   . Sleep apnea     Medications:  Scheduled:  . atenolol  50 mg Oral Daily  . atorvastatin  10 mg Oral q1800  . calcium carbonate  2 tablet Oral Daily  . cholecalciferol  2,000 Units Oral Daily  . cyanocobalamin  500 mcg Oral Daily  . hydrochlorothiazide  12.5 mg Oral Daily  . mometasone-formoterol  2 puff Inhalation BID  . montelukast  10 mg Oral QHS  . multivitamin with minerals  1 tablet Oral Daily  . pantoprazole  40 mg Oral Daily  . ramelteon  8 mg Oral QHS   PRN: acetaminophen **OR** acetaminophen, albuterol, nitroGLYCERIN, ondansetron  Assessment: 65 y/o female with history of atrial fibrillation was admitted 7/14 with chest palpitations and chest pressure. Pt was not on any anticoagulation PTA. Upon admission, was initially started on heparin for A Fib. Pharmacy was consulted to switch patient from heparin to apixaban.  Patient is <40 years old, >65kg, and has stable renal function (SCr =  0.92).   Goal of Therapy:  Anticoagulation to prevent embolism in setting of a fib.   Plan:  - Stop heparin infusion. - Start apixaban '5mg'$  PO BID upon stopping heparin. - Monitor renal function and s/sx of bleeding.  Demetrius Charity, PharmD Acute Care Pharmacy Resident  Pager: [phone] 03/03/2016

## 2016-03-03 NOTE — Progress Notes (Signed)
Subjective:  She is feeling much better today.  Converted to sinus rhythm.  Previous reports read.  Evidently had a heart cath a couple of years ago but don't have report as of yet.  No chest pain or shortness of breath.  Objective:  Vital Signs in the last 24 hours: BP 117/55 mmHg  Pulse 54  Temp(Src) 97.7 F (36.5 C) (Oral)  Resp 18  Ht '5\' 2"'$  (1.575 m)  Wt 131.588 kg (290 lb 1.6 oz)  BMI 53.05 kg/m2  SpO2 98%  Physical Exam: Obese female in no acute distress Lungs:  Clear Cardiac:  Regular rhythm, normal S1 and S2, no S3 Abdomen:  Soft, nontender, no masses, quite large Extremities:  No edema present  Intake/Output from previous day: 07/14 0701 - 07/15 0700 In: 576 [P.O.:576] Out: -   Weight Filed Weights   03/02/16 1907 03/02/16 2158 03/03/16 0440  Weight: 131.543 kg (290 lb) 132.45 kg (292 lb) 131.588 kg (290 lb 1.6 oz)    Lab Results: Basic Metabolic Panel:  Recent Labs  03/02/16 1854 03/03/16 0416  NA 138 141  K 4.6 3.7  CL 109 108  CO2 20* 25  GLUCOSE 195* 115*  BUN 21* 17  CREATININE 0.97 0.92   CBC:  Recent Labs  03/02/16 1854  WBC 9.0  HGB 13.5  HCT 42.7  MCV 79.5  PLT 258   Cardiac Enzymes: Troponin (Point of Care Test)  Recent Labs  03/02/16 1921  TROPIPOC 0.01   Cardiac Panel (last 3 results)  Recent Labs  03/02/16 2251 03/03/16 0417  TROPONINI 0.05* 0.05*    Telemetry: Normal sinus rhythm without recurrent atrial fibrillation  Assessment/Plan:  1.  Paroxysmal atrial fibrillation - CHA2DS2VASC score 3 2.  Hypertension 3.  Morbid obesity with history of gastric bypass  Recommendations:  Awaiting echocardiogram today.  Need to obtain cath report from previous hospital-if no significant disease then no further workup needed at this time or chest pain since atrial fibrillation could've been the cause.  In terms of her atrial fibrillation await echo.  If she does not have severe LVH could use flecainide to help control  rhythm.  Would go ahead and switch to a oral anticoagulant and let her ambulate.  Continue beta blocker.     Kerry Hough  MD [facility] Card[facility]  03/03/2016, 9:29 AM

## 2016-03-03 NOTE — Discharge Instructions (Signed)

## 2016-03-03 NOTE — Progress Notes (Signed)
Patient is refusing CPAP, states she is fine without it. RT made pt aware for need and that if she changed her mind we would come back and set up a CPAP for her.

## 2016-03-03 NOTE — Progress Notes (Signed)
[place] for heparin  Indication: chest pain/ACS  Allergies  Allergen Reactions  . Iodine Hives    Patient Measurements: Height: '5\' 2"'$  (157.5 cm) Weight: 292 lb (132.45 kg) IBW/kg (Calculated) : 50.1 Heparin Dosing Weight: 83  Vital Signs: Temp: 98.5 F (36.9 C) (07/14 2158) Temp Source: Oral (07/14 2158) BP: 117/63 mmHg (07/14 2158) Pulse Rate: 53 (07/14 2158)  Labs:  Recent Labs  03/02/16 1854 03/02/16 2251 03/03/16 0417  HGB 13.5  --   --   HCT 42.7  --   --   PLT 258  --   --   HEPARINUNFRC  --   --  0.47  CREATININE 0.97  --   --   TROPONINI  --  0.05*  --     Estimated Creatinine Clearance: 75.9 mL/min (by C-G formula based on Cr of 0.97).  Assessment: 65 yoF on heparin for afib. Heparin level therapeutic on 1100 units/hr. No bleeding noted.  Goal of Therapy:  Heparin level 0.3-0.7 units/ml Monitor platelets by anticoagulation protocol: Yes   Plan:  Continue heparin at 1100 units/hr Will f/u 6 hr confirmatory heparin level  Sherlon Handing, PharmD, BCPS Clinical pharmacist, pager [phone] 03/03/2016, 4:41 AM

## 2016-03-03 NOTE — Progress Notes (Signed)
   Subjective: Suzanne Macdonald reports improvement this morning.  She is no longer feeling SOB and denies chest pain or pain radiating to her arm.  She reports spontaneously converting to normal sinus rhythm yesterday and denies any changes since.     Objective: Vital signs in last 24 hours: Filed Vitals:   03/03/16 0756 03/03/16 0808 03/03/16 1003 03/03/16 1407  BP:  117/55  119/52  Pulse:   57 50  Temp:    97.7 F (36.5 C)  TempSrc:    Oral  Resp:    16  Height:      Weight:      SpO2: 96% 98%  98%   Physical Exam Physical Exam  Constitutional: She is oriented to person, place, and time. No distress.  HENT:  Head: Normocephalic and atraumatic.  Eyes: Pupils are equal, round, and reactive to light.  Cardiovascular: Normal rate, regular rhythm and normal heart sounds.   Pulmonary/Chest: Effort normal and breath sounds normal. No respiratory distress.  Abdominal: Soft. Bowel sounds are normal. She exhibits no distension. There is no tenderness.  Musculoskeletal: She exhibits no edema.  Neurological: She is alert and oriented to person, place, and time.  Skin: Skin is warm and dry.  Psychiatric: Mood and affect normal.      Assessment/Plan: Atrial fibrillation with RVR (HCC) Spontaneously converted to normal sinus rhythm.  Patient has been experiencing recurrent episodes of heart palpitations, chest pain and arm pain for several months that last up to an hour.  Her home medications included atenolol but not currently on an anticoagulant.  Patient stated she would like to be on an anticoagulant.  Cardiology recommended continuing the atenolol adding a DOAC and possibly an antiarrhythmic   - Awaiting Echo - Cardiology awaiting cardiac catheterization report from [place] - Patient was counceled on anticoagulation therapy for risk and benefits - Pharm consult for apixaban -  Appreciate Cardiology recommendations for possible chronic antiarrhythmics  Dispo: Anticipated discharge  in approximately 2 day(s).   LOS: 1 day   Suzanne Party, DO 03/03/2016, 4:04 PM Pager: [phone]

## 2016-03-03 NOTE — Procedures (Signed)
Patientt refuses CPAP.  She stated she did not wear one at home and did not want to wear while in hospital.

## 2016-03-04 ENCOUNTER — Inpatient Hospital Stay (HOSPITAL_COMMUNITY): Payer: Medicare Other

## 2016-03-04 DIAGNOSIS — I4891 Unspecified atrial fibrillation: Secondary | ICD-10-CM

## 2016-03-04 HISTORY — DX: Morbid (severe) obesity due to excess calories: E66.01

## 2016-03-04 LAB — BASIC METABOLIC PANEL
ANION GAP: 7 (ref 5–15)
BUN: 21 mg/dL — ABNORMAL HIGH (ref 6–20)
CALCIUM: 9.1 mg/dL (ref 8.9–10.3)
CO2: 26 mmol/L (ref 22–32)
Chloride: 106 mmol/L (ref 101–111)
Creatinine, Ser: 0.89 mg/dL (ref 0.44–1.00)
Glucose, Bld: 108 mg/dL — ABNORMAL HIGH (ref 65–99)
POTASSIUM: 3.9 mmol/L (ref 3.5–5.1)
Sodium: 139 mmol/L (ref 135–145)

## 2016-03-04 LAB — ECHOCARDIOGRAM COMPLETE
Height: 62 in
Weight: 4652.8 oz

## 2016-03-04 LAB — MAGNESIUM: Magnesium: 1.9 mg/dL (ref 1.7–2.4)

## 2016-03-04 NOTE — Progress Notes (Signed)
Patient is refusing the use of CPAP for the night. RT educated patient and informed patient if she changes her mind have RN contact RT and we would bring one up to her.

## 2016-03-04 NOTE — Progress Notes (Signed)
Subjective:  No complaints of shortness of breath or chest pain.  Remains in sinus rhythm but had run of nonsustained atrial fibrillation last night.  Echo not done yesterday and reordered today.  Objective:  Vital Signs in the last 24 hours: BP 127/54 mmHg  Pulse 63  Temp(Src) 97.8 F (36.6 C) (Oral)  Resp 20  Ht '5\' 2"'$  (1.575 m)  Wt 131.906 kg (290 lb 12.8 oz)  BMI 53.17 kg/m2  SpO2 97%  Physical Exam: Obese female in no acute distress Lungs:  Clear Cardiac:  Regular rhythm, normal S1 and S2, no S3 Abdomen:  Soft, nontender, no masses, quite large Extremities:  No edema present  Intake/Output from previous day: 07/15 0701 - 07/16 0700 In: 360 [P.O.:360] Out: -   Weight Filed Weights   03/02/16 2158 03/03/16 0440 03/04/16 0359  Weight: 132.45 kg (292 lb) 131.588 kg (290 lb 1.6 oz) 131.906 kg (290 lb 12.8 oz)    Lab Results: Basic Metabolic Panel:  Recent Labs  03/03/16 0416 03/04/16 0558  NA 141 139  K 3.7 3.9  CL 108 106  CO2 25 26  GLUCOSE 115* 108*  BUN 17 21*  CREATININE 0.92 0.89   CBC:  Recent Labs  03/02/16 1854  WBC 9.0  HGB 13.5  HCT 42.7  MCV 79.5  PLT 258   Cardiac Enzymes: Troponin (Point of Care Test)  Recent Labs  03/02/16 1921  TROPIPOC 0.01   Cardiac Panel (last 3 results)  Recent Labs  03/02/16 2251 03/03/16 0417 03/03/16 0900  TROPONINI 0.05* 0.05* 0.03*    Telemetry: Normal sinus rhythm without1 run of nonsustained atrial fibrillation last evening   Assessment/Plan:  1.  Paroxysmal atrial fibrillation - CHA2DS2VASC score 3 2.  Hypertension 3.  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  Trivial elevation of troponin likely demand ischemia due to rapid atrial fibrillation 5.  Sleep apnea  Recommendations:  Echo reordered.  Awaiting results prior to initiation of flecainide.  Need to get results of previous cath.     Kerry Hough  MD [facility] Cardiology  03/04/2016, 9:51 AM

## 2016-03-04 NOTE — Progress Notes (Signed)
Internal Medicine Attending  Date: 03/04/2016  Patient name: Suzanne Macdonald Medical record number: [id number] Date of birth: 30-Jun-1951 Age: 65 y.o. Gender: female  I saw and evaluated the patient. I reviewed the resident's note by Dr. Young Berry and I agree with the resident's findings and plans as documented in her progress note.  When seen on rounds this morning [name] was feeling fine without complaints. She did have one brief run of nonsustained atrial fibrillation overnight. We continue to await the performance of the echocardiogram to assess left ventricular function prior to making a decision on an antiarrhythmic. We are also awaiting the results from the previous catheterization done in [place]. Once this information is available we will be able to make final therapeutic and diagnostic decisions.

## 2016-03-04 NOTE — Progress Notes (Signed)
   Subjective: [name] was evaluated this morning on rounds.  She was sitting comfortably in the bed side chair.  She denies any SOB, chest pain, pain in her arm, or abdominal pain.    Objective: Vital signs in last 24 hours: Filed Vitals:   03/03/16 2052 03/03/16 2123 03/04/16 0359 03/04/16 0924  BP: 133/54  127/54   Pulse: 62  63   Temp: 98.1 F (36.7 C)  97.8 F (36.6 C)   TempSrc: Oral  Oral   Resp: 18  20   Height:      Weight:   290 lb 12.8 oz (131.906 kg)   SpO2: 97% 96% 97% 97%   Physical Exam Physical Exam  Constitutional: She is oriented to person, place, and time and well-developed, well-nourished, and in no distress.  obese  HENT:  Head: Normocephalic and atraumatic.  Cardiovascular: Normal rate, regular rhythm and normal heart sounds.   Pulmonary/Chest: Effort normal and breath sounds normal. No respiratory distress.  Abdominal: Soft. She exhibits no distension. There is no tenderness.  Musculoskeletal: She exhibits no edema.  Neurological: She is alert and oriented to person, place, and time.  Skin: Skin is warm and dry.  Psychiatric: Mood and affect normal.     Assessment/Plan: Paroxysmal atrial fibrillation (HCC) Currently in normal sinus rhythm. Patient was started on Eliquis yesterday.  She is currently awaiting the Echo procedure and this will help further decide what anti arrhythmic would be appropriate.  An [place].   - Awaiting cath records from [place] '5mg'$  BID - Continue Atenolol '50mg'$  daily    Dispo: Anticipated discharge in approximately 1 day(s).   LOS: 2 days   Valinda Party, DO 03/04/2016, 1:10 PM Pager: [phone]

## 2016-03-04 NOTE — Progress Notes (Signed)
  Echocardiogram 2D Echocardiogram has been performed.  Donata Clay 03/04/2016, 1:54 PM

## 2016-03-05 ENCOUNTER — Other Ambulatory Visit: Payer: Self-pay | Admitting: Physician Assistant

## 2016-03-05 DIAGNOSIS — R079 Chest pain, unspecified: Secondary | ICD-10-CM | POA: Insufficient documentation

## 2016-03-05 DIAGNOSIS — I4891 Unspecified atrial fibrillation: Secondary | ICD-10-CM

## 2016-03-05 DIAGNOSIS — Z5181 Encounter for therapeutic drug level monitoring: Secondary | ICD-10-CM

## 2016-03-05 DIAGNOSIS — Z79899 Other long term (current) drug therapy: Principal | ICD-10-CM

## 2016-03-05 LAB — CREATININE, SERUM
CREATININE: 0.86 mg/dL (ref 0.44–1.00)
GFR calc Af Amer: >60 mL/min (ref >60)
GFR calc non Af Amer: >60 mL/min (ref >60)

## 2016-03-05 LAB — CBC
HCT: 39.2 % (ref 36.0–46.0)
HEMOGLOBIN: 12.1 g/dL (ref 12.0–15.0)
MCH: 24.8 pg — AB (ref 26.0–34.0)
MCHC: 30.9 g/dL (ref 30.0–36.0)
MCV: 80.3 fL (ref 78.0–100.0)
Platelets: 231 10*3/uL (ref 150–400)
RBC: 4.88 MIL/uL (ref 3.87–5.11)
RDW: 16.7 % — ABNORMAL HIGH (ref 11.5–15.5)
WBC: 7 10*3/uL (ref 4.0–10.5)

## 2016-03-05 MED ORDER — HEPARIN SOD (PORK) LOCK FLUSH 100 UNIT/ML IV SOLN
500.0000 [IU] | INTRAVENOUS | Status: AC | PRN
  Administered 2016-03-05: 500 [IU]

## 2016-03-05 MED ORDER — APIXABAN 5 MG PO TABS: 5.0000 mg | ORAL_TABLET | Freq: Two times a day (BID) | ORAL | Status: DC

## 2016-03-05 MED ORDER — FLECAINIDE ACETATE 50 MG PO TABS: 50.0000 mg | ORAL_TABLET | Freq: Two times a day (BID) | ORAL | Status: DC

## 2016-03-05 MED ORDER — FLECAINIDE ACETATE 50 MG PO TABS
50.0000 mg | ORAL_TABLET | Freq: Two times a day (BID) | ORAL | Status: DC
  Administered 2016-03-05: 50 mg via ORAL
  Filled 2016-03-05 (×2): qty 1

## 2016-03-05 NOTE — Progress Notes (Signed)
   Subjective: Suzanne Macdonald was evaluated this morning on rounds.  She was sitting in the bedside chair watching TV.  She denied any overnight episodes of chest pain, heart palpitations or SOB.  Objective: Vital signs in last 24 hours: Filed Vitals:   03/04/16 1958 03/04/16 2010 03/05/16 0412 03/05/16 0842  BP:  131/61 139/54 113/68  Pulse:  55 57   Temp:  98 F (36.7 C) 98.7 F (37.1 C)   TempSrc:  Oral Oral   Resp:  18 18   Height:      Weight:   289 lb (131.09 kg)   SpO2: 96% 98% 98%    Physical Exam Physical Exam  Constitutional: She is oriented to person, place, and time and well-developed, well-nourished, and in no distress.  HENT:  Head: Normocephalic and atraumatic.  Cardiovascular: Normal rate, regular rhythm and normal heart sounds.   Pulmonary/Chest: Effort normal and breath sounds normal.  Abdominal: Soft. She exhibits no distension. There is no tenderness.  Neurological: She is alert and oriented to person, place, and time.  Skin: Skin is warm and dry.  Psychiatric: Mood and affect normal.    Assessment/Plan: Paroxysmal atrial fibrillation ([place]) Patient is currently in normal sinus rhythm.  No evidence of atrial fibrillation noted on telemetry in the past 24hrs.  Echo showed normal LV size with EF 55-60%. Normal RV size and systolic function. No significant valvular abnormalities.  Previous cath procedure results from 06/02/14 at [facility] was obtained and showed no coronary artery stenosis.  Patient has been started on Eliquis during admission and continues to take her atenolol.  With a history of  symptomatic atrial fibrillation every other day patient would benefit from an antiarrythmic.   - Appreciate Cardiology's recommendations for flecainide '50mg'$  bid - Continue Eliquis '5mg'$  BID - Continue Atenolol '50mg'$  daily - follow up with cardiology for ETT     Dispo: Anticipated discharge today   LOS: 3 days   Suzanne Party, DO 03/05/2016, 11:13  AM Pager: [phone]

## 2016-03-05 NOTE — Progress Notes (Signed)
Internal Medicine Attending  Date: 03/05/2016  Patient name: Suzanne Macdonald Medical record number: [id number] Date of birth: 07-04-1951 Age: 65 y.o. Gender: female  I saw and evaluated the patient. I reviewed the resident's note by Dr. Young Berry and I agree with the resident's findings and plans as documented in her progress note.  When seen on rounds this morning [name] had no specific complaints. Review of telemetry revealed no interim runs of atrial fibrillation with rapid ventricular rate. We appreciate cardiology's input and we'll start flecainide 50 mg by mouth twice daily. We will also continue the anticoagulation and the atenolol. She is ready for discharge home today with follow-up in the [facility] as well as in the Cardiology clinic.

## 2016-03-05 NOTE — Care Management Important Message (Signed)
Important Message  Patient Details  Name: Suzanne Macdonald MRN: [id number] Date of Birth: 1951/01/13   Medicare Important Message Given:  Yes    Kaoir Loree Abena 03/05/2016, 3:16 PM

## 2016-03-05 NOTE — Progress Notes (Signed)
Patient Name: Suzanne Macdonald Date of Encounter: 03/05/2016  Primary Cardiologist: new   Principal Problem:   Atrial fibrillation with tachycardic ventricular rate ([place]) Active Problems:   Essential hypertension   Paroxysmal atrial fibrillation ([facility])   Chest pain   Asthma   Morbid obesity ([place])    SUBJECTIVE  Denies any CP or SOB. Feeling well  CURRENT MEDS . apixaban  5 mg Oral BID  . atenolol  50 mg Oral Daily  . atorvastatin  10 mg Oral q1800  . calcium carbonate  2 tablet Oral Daily  . cholecalciferol  2,000 Units Oral Daily  . cyanocobalamin  500 mcg Oral Daily  . hydrochlorothiazide  12.5 mg Oral Daily  . mometasone-formoterol  2 puff Inhalation BID  . montelukast  10 mg Oral QHS  . multivitamin with minerals  1 tablet Oral Daily  . pantoprazole  40 mg Oral Daily  . ramelteon  8 mg Oral QHS    OBJECTIVE  Filed Vitals:   03/04/16 1958 03/04/16 2010 03/05/16 0412 03/05/16 0842  BP:  131/61 139/54 113/68  Pulse:  55 57   Temp:  98 F (36.7 C) 98.7 F (37.1 C)   TempSrc:  Oral Oral   Resp:  18 18   Height:      Weight:   289 lb (131.09 kg)   SpO2: 96% 98% 98%    No intake or output data in the 24 hours ending 03/05/16 0922 Filed Weights   03/03/16 0440 03/04/16 0359 03/05/16 0412  Weight: 290 lb 1.6 oz (131.588 kg) 290 lb 12.8 oz (131.906 kg) 289 lb (131.09 kg)    PHYSICAL EXAM  General: Pleasant, NAD. Neuro: Alert and oriented X 3. Moves all extremities spontaneously. Psych: Normal affect. HEENT:  Normal  Neck: Supple without bruits or JVD. Lungs:  Resp regular and unlabored, CTA. Heart: RRR no s3, s4, or murmurs. Abdomen: Soft, non-tender, non-distended, BS + x 4.  Extremities: No clubbing, cyanosis or edema. DP/PT/Radials 2+ and equal bilaterally.  Accessory Clinical Findings  CBC  Recent Labs  03/02/16 1854 03/05/16 0511  WBC 9.0 7.0  HGB 13.5 12.1  HCT 42.7 39.2  MCV 79.5 80.3  PLT 258 629   Basic Metabolic Panel  Recent  Labs  03/03/16 0416 03/04/16 0558 03/05/16 0511  NA 141 139  --   K 3.7 3.9  --   CL 108 106  --   CO2 25 26  --   GLUCOSE 115* 108*  --   BUN 17 21*  --   CREATININE 0.92 0.89 0.86  CALCIUM 8.9 9.1  --   MG  --  1.9  --    Cardiac Enzymes  Recent Labs  03/02/16 2251 03/03/16 0417 03/03/16 0900  TROPONINI 0.05* 0.05* 0.03*   Thyroid Function Tests  Recent Labs  03/02/16 2043  TSH 1.964    TELE NSR    ECG  No new EKG  Echocardiogram 03/04/2016  LV EF: 55% - 60%  ------------------------------------------------------------------- Indications: Atrial fibrillation - 427.31.  ------------------------------------------------------------------- History: PMH: Sleep apnea. Atrial fibrillation. Risk factors: Hypertension. Morbidly obese.  ------------------------------------------------------------------- Study Conclusions  - Left ventricle: The cavity size was normal. Wall thickness was  normal. Systolic function was normal. The estimated ejection  fraction was in the range of 55% to 60%. Wall motion was normal;  there were no regional wall motion abnormalities. Doppler  parameters are consistent with abnormal left ventricular  relaxation (grade 1 diastolic dysfunction). - Aortic valve: There was  no stenosis. - Mitral valve: Mildly calcified annulus. There was no significant  regurgitation. - Left atrium: The atrium was mildly dilated. - Right ventricle: The cavity size was normal. Systolic function  was normal. - Pulmonary arteries: No complete TR doppler jet so unable to  estimate PA systolic pressure. - Inferior vena cava: The vessel was normal in size. The  respirophasic diameter changes were in the normal range (>= 50%),  consistent with normal central venous pressure.  Impressions:  - Normal LV size with EF 55-60%. Normal RV size and systolic  function. No significant valvular abnormalities.     Radiology/Studies  Dg Chest Portable 1 View  03/02/2016  CLINICAL DATA:  Acute shortness of breath and chest pain. EXAM: PORTABLE CHEST 1 VIEW COMPARISON:  01/22/2016 radiographs FINDINGS: Upper limits normal heart size again noted. Mild pulmonary vascular congestion is present. A right-sided Port-A-Cath is again identified with tip overlying the mid SVC. There is no evidence of focal airspace disease, pulmonary edema, suspicious pulmonary nodule/mass, pleural effusion, or pneumothorax. No acute bony abnormalities are identified. IMPRESSION: Pulmonary vascular congestion. Electronically Signed   By: Margarette Canada M.D.   On: 03/02/2016 19:10   Mm Digital Screening  02/16/2016  CLINICAL DATA:  Screening. EXAM: DIGITAL SCREENING BILATERAL MAMMOGRAM WITH CAD COMPARISON:  None. ACR Breast Density Category a: The breast tissue is almost entirely fatty. FINDINGS: There are no findings suspicious for malignancy. Images were processed with CAD. IMPRESSION: No mammographic evidence of malignancy. A result letter of this screening mammogram will be mailed directly to the patient. RECOMMENDATION: Screening mammogram in one year. (Code:SM-B-01Y) BI-RADS CATEGORY  1: Negative. Electronically Signed   By: Lajean Manes M.D.   On: 02/16/2016 12:14    ASSESSMENT AND PLAN  1. Symptomatic atrial fibrillation spontaneously converted to sinus rhythm  - CHA2DS2-Vasc score 3 (age 65, HTN, female), TSH normal.   - Echo 03/04/2016 EF 55-60%, grade 1 DD, no significant valvular abnormalities.   - started on eliquis. Continue atenolol, outside cath report reviewed, clean coronaries on 06/01/2014 at [facility], no plan for ischemic workup. Will discuss with MD, likely start flecainide today at '50mg'$  BID, unclear if she is capable to doing ETT as outpatient after initiation of flecainide. She also interested to have hernia repair soon. If capable to doing ETT on flecainide without ventricular ectopy, should be safe  to go through with surgery.  2. ST changes during RVR  - reportedly had cardiac catheterization in [place] 2 years ago without significant blockage.   3. HTN  4. HLD  5. Obesity s/p gastric bypass surgery 1992  Signed, Almyra Deforest PA-C Pager: [phone] Patient seen and examined and history reviewed. Agree with above findings and plan. No further Afib noted. Echo results reviewed. Cardiac cath in 2015 showed no obstructive CAD. She is safe to initiate Flecainide 50 mg bid. Continue tenormin and Eliquis. She can be DC from our standpoint. We will arrange follow up in our office with regular ETT.   Jaanai Salemi [place], [place] 03/05/2016 1:29 PM

## 2016-03-06 ENCOUNTER — Telehealth: Payer: Self-pay | Admitting: *Deleted

## 2016-03-06 NOTE — Telephone Encounter (Signed)
HFU APPT -FOR TRANS/OF CARE F/U [facility] 03/13/2016 FOR 3:45PM

## 2016-03-07 NOTE — Discharge Summary (Signed)
Name: Suzanne Macdonald MRN: [id number] DOB: July 15, 1951 65 y.o. PCP: Shela Leff, MD  Date of Admission: 03/02/2016   Date of Discharge: 03/05/2016 Attending Physician: Dr. Eppie Gibson Discharge Diagnosis: 1. Atrial fibrillation with tachycardic ventricular rate ([place])   Discharge Medications:   Medication List    STOP taking these medications        CALCIUM GUMMIES PO      TAKE these medications        acetaminophen 500 MG tablet  Commonly known as:  TYLENOL  Take 1,000 mg by mouth every 6 (six) hours as needed for mild pain or moderate pain.     albuterol 108 (90 Base) MCG/ACT inhaler  Commonly known as:  PROVENTIL HFA;VENTOLIN HFA  Inhale 1-2 puffs into the lungs every 6 (six) hours as needed for wheezing or shortness of breath.     apixaban 5 MG Tabs tablet  Commonly known as:  ELIQUIS  Take 1 tablet (5 mg total) by mouth 2 (two) times daily.     atenolol 50 MG tablet  Commonly known as:  TENORMIN  Take 1 tablet (50 mg total) by mouth daily.     atorvastatin 10 MG tablet  Commonly known as:  LIPITOR  Take 1 tablet (10 mg total) by mouth daily.     BC HEADACHE POWDER PO  Take 1 Package by mouth daily.     budesonide-formoterol 160-4.5 MCG/ACT inhaler  Commonly known as:  SYMBICORT  Inhale 2 puffs into the lungs 2 (two) times daily.     cholecalciferol 1000 units tablet  Commonly known as:  VITAMIN D  Take 2,000 Units by mouth daily.     cyanocobalamin 500 MCG tablet  Take 1,000 mcg by mouth daily.     flecainide 50 MG tablet  Commonly known as:  TAMBOCOR  Take 1 tablet (50 mg total) by mouth 2 (two) times daily.     hydrochlorothiazide 25 MG tablet  Commonly known as:  HYDRODIURIL  Take 1 tablet (25 mg total) by mouth daily.     multivitamin with minerals tablet  Take 1 tablet by mouth daily.     omeprazole 40 MG capsule  Commonly known as:  PRILOSEC  Take 1 capsule (40 mg total) by mouth daily.     ondansetron 8 MG disintegrating tablet    Commonly known as:  ZOFRAN-ODT  Take 1 tablet (8 mg total) by mouth every 8 (eight) hours as needed for nausea or vomiting.     zinc gluconate 50 MG tablet  Take 50 mg by mouth daily.        Disposition and follow-up:   SuzanneTonianne Lindeman was discharged from [facility] in stable condition.  At the hospital follow up visit please address:  1.  Any issues with new medications, Eliquis and Flecainide.    2.  Labs / imaging needed at time of follow-up: outpatient regular ETT  3.  Pending labs/ test needing follow-up: none  Follow-up Appointments:     Follow-up Information    Follow up with Shela Leff, MD. Schedule an appointment as soon as possible for a visit in 1 week.   Specialty:  Internal Medicine   Contact information:   [address] [place] [address] [phone]       Follow up with [facility]. Schedule an appointment as soon as possible for a visit in 1 week.   Contact information:   [place]  [place] [address] [phone]      Follow up with Peter Martinique, MD.   Specialty:  Cardiology   Why:  office scheduler will contact you to arrange followup and outpatient treadmill test after starting on flecainide   Contact information:   [place] [address] [phone]       Hospital Course by problem list:   1. Atrial fibrillation with tachycardic ventricular rate ([place]) Suzanne Macdonald has a history of paroxysmal atrial fibrillation and has had symptoms of palpitations, chest pain and shortness of breath several times a year since she was diagnosed 3 years ago.  More recently she is having these episodes happen as close as every other day.  The symptoms spontaneously resolve.  When presenting to the ED she was having symptoms that were lasting longer than usual, over an hour.  EKG showed Atrial fibrillation with RVR and  spontaneously resolved shortly after.  She is compliant on her atenolol and is not on an anticoagulant.  She had a cardiac catheterization on 06/02/14 at [facility] records were obtained and showed no coronary artery stenosis.  Echo showed LV EF 55%-60% and grade 1 diastolic dysfunction.  Patient was started on Eliquis '5mg'$  BID. Per Cardiology's recommendation an antiarrythmic was recommended and Flecainide '50mg'$  BID was prescribed on discharge.   During admission patient had a run of nonsustained atrial fibrillation that spontaneously resolved and she reported no symptoms.  24hrs prior to discharge no Atrial Fibrillation was noted.    Discharge Vitals:   BP 133/67 mmHg  Pulse 57  Temp(Src) 98.7 F (37.1 C) (Oral)  Resp 18  Ht '5\' 2"'$  (1.575 m)  Wt 289 lb (131.09 kg)  BMI 52.85 kg/m2  SpO2 98%  Pertinent Labs, Studies, and Procedures:  Echo - Normal LV size with EF 55-60%. Normal RV size and systolic function. No significant valvular abnormalities.   Discharge Instructions: Discharge Instructions    Diet - low sodium heart healthy    Complete by:  As directed      Discharge instructions    Complete by:  As directed   Suzanne Macdonald it was a pleasure taking care of you during your admission. You were in the hospital for Atrial Fibrillation You have been started on two new medications Eliquis and Flecainide. Please follow up with your cardiologist and PCP upon discharge.     Increase activity slowly    Complete by:  As directed            Signed: Valinda Party, DO 03/07/2016, 11:31 AM   Pager: [phone]

## 2016-03-09 NOTE — Telephone Encounter (Signed)
Transition Care Management Follow-up Telephone Call   Date discharged?    How have you been since you were released from the hospital? "well, thank you"   Do you understand why you were in the hospital? yes   Do you understand the discharge instructions? yes   Where were you discharged to? home   Items Reviewed:  Medications reviewed: yes  Allergies reviewed: yes  Dietary changes reviewed: no  Referrals reviewed: yes   Functional Questionnaire:   Activities of Daily Living (ADLs):   She states they are independent in the following: ambulation, bathing and hygiene, feeding, continence, grooming, toileting and dressing States they require assistance with the following: none   Any transportation issues/concerns?: no   Any patient concerns? no   Confirmed importance and date/time of follow-up visits scheduled yes  Provider Appointment booked with 03/13/2016 at 3:45  Confirmed with patient if condition begins to worsen call PCP or go to the ER.  Patient was given the office number and encouraged to call back with question or concerns.  : yes

## 2016-03-12 ENCOUNTER — Encounter: Payer: Self-pay | Admitting: Internal Medicine

## 2016-03-13 ENCOUNTER — Ambulatory Visit (INDEPENDENT_AMBULATORY_CARE_PROVIDER_SITE_OTHER): Payer: Medicare Other | Admitting: Internal Medicine

## 2016-03-13 ENCOUNTER — Encounter: Payer: Self-pay | Admitting: Internal Medicine

## 2016-03-13 VITALS — BP 160/68 | HR 62 | Temp 98.4°F | Ht 62.0 in | Wt 293.0 lb

## 2016-03-13 DIAGNOSIS — Z9884 Bariatric surgery status: Secondary | ICD-10-CM

## 2016-03-13 DIAGNOSIS — I10 Essential (primary) hypertension: Secondary | ICD-10-CM

## 2016-03-13 DIAGNOSIS — Z79899 Other long term (current) drug therapy: Secondary | ICD-10-CM

## 2016-03-13 DIAGNOSIS — Z7901 Long term (current) use of anticoagulants: Secondary | ICD-10-CM

## 2016-03-13 DIAGNOSIS — I480 Paroxysmal atrial fibrillation: Secondary | ICD-10-CM

## 2016-03-13 DIAGNOSIS — Z85038 Personal history of other malignant neoplasm of large intestine: Secondary | ICD-10-CM | POA: Diagnosis not present

## 2016-03-13 DIAGNOSIS — M8580 Other specified disorders of bone density and structure, unspecified site: Secondary | ICD-10-CM | POA: Diagnosis not present

## 2016-03-13 DIAGNOSIS — J45909 Unspecified asthma, uncomplicated: Secondary | ICD-10-CM | POA: Diagnosis not present

## 2016-03-13 DIAGNOSIS — Z87891 Personal history of nicotine dependence: Secondary | ICD-10-CM

## 2016-03-13 MED ORDER — APIXABAN 5 MG PO TABS: 5.0000 mg | ORAL_TABLET | Freq: Two times a day (BID) | ORAL | 1 refills | Status: DC

## 2016-03-13 MED ORDER — FLECAINIDE ACETATE 50 MG PO TABS: 50.0000 mg | ORAL_TABLET | Freq: Two times a day (BID) | ORAL | 0 refills | Status: DC

## 2016-03-13 NOTE — Patient Instructions (Addendum)
Continue taking Eliquis as instructed.  Continue taking Flecainide as instructed.   Please go to your appointment with cardiology.  You have been referred to gastroenterology.  Return for a follow-up visit in 1 month.

## 2016-03-15 DIAGNOSIS — M8580 Other specified disorders of bone density and structure, unspecified site: Secondary | ICD-10-CM | POA: Insufficient documentation

## 2016-03-15 NOTE — Progress Notes (Signed)
   CC: "I'm here for a follow-up"  HPI:  Suzanne Macdonald is a 65 y.o. female with a past medical history of conditions listed below presenting to the clinic to discuss her A. fib, hypertension, asthma, history of gastric bypass surgery, osteopenia, and ventral hernia.  Please see problem based charting for the status of the patient's chronic medical conditions.  Past Medical History:  Diagnosis Date  . Asthma   . Atrial fibrillation ([place])   . Cervical cancer ([place]) 1972  . Colon cancer ([place])   . H/O gastric bypass   . Hyperlipidemia   . Hypertension   . Morbid obesity ([place]) 03/04/2016  . Sleep apnea     Review of Systems:  Pertinent positives mentioned in history of present illness. All other review of systems negative.  Physical Exam:  Vitals:   03/13/16 1616  BP: (!) 160/68  Pulse: 62  Temp: 98.4 F (36.9 C)  TempSrc: Oral  Weight: 293 lb (132.9 kg)  Height: '5\' 2"'$  (1.575 m)   Physical Exam  Constitutional: She is oriented to person, place, and time. She appears well-developed and well-nourished. No distress.  Morbidly obese  HENT:  Head: Normocephalic and atraumatic.  Mouth/Throat: Oropharynx is clear and moist.  Eyes: EOM are normal. Pupils are equal, round, and reactive to light.  Neck: Neck supple. No tracheal deviation present.  Cardiovascular: Normal rate, regular rhythm and intact distal pulses.  Exam reveals no gallop and no friction rub.   No murmur heard. Pulmonary/Chest: Effort normal and breath sounds normal. No respiratory distress. She has no wheezes. She has no rales.  Abdominal: Soft. Bowel sounds are normal. She exhibits no distension. There is no tenderness. There is no guarding.  Obese  Musculoskeletal: Normal range of motion. She exhibits no edema or deformity.  Neurological: She is alert and oriented to person, place, and time.  Skin: Skin is warm and dry.    Assessment & Plan:   See Encounters Tab for problem based charting.   Patient  discussed with Dr. Evette Doffing

## 2016-03-15 NOTE — Assessment & Plan Note (Addendum)
Assessment States she had her gastric bypass surgery 25 years ago. Labs checked during previous visit in June were normal including Hgb, electrolytes, B12, and LFTs. Vitamin D was low and patient is currently on supplementation. She is also taking a B12 supplement and calcium supplement. Lipid panel showed cholesterol 227, HDL 50, and LDL 151. Triglycerides were normal.  Plan Continue B12 supplement Continue vitamin D supplement Continue calcium supplement Check PTH Check thiamine level Check folate level Check ferritin, if <100, start iron supplementation

## 2016-03-15 NOTE — Assessment & Plan Note (Addendum)
Assessment CHADS2VASC 3. Recently admitted to the hospital for A. fib with tachycardic ventricular rate. Started on Eliquis 5 mg twice daily and flecainide 50 mg twice daily during her hospitalization. Patient reports having one isolated episode of "flutter" that lasted a few seconds 2 days ago. Denies having any chest pain, shortness breath, or dizziness at that time. States otherwise she has been feeling very well and has an appointment coming up with cardiology in a few days.  Plan -Refill Eliquis -Refill flecainide -Advised patient to follow-up with cardiology

## 2016-03-15 NOTE — Assessment & Plan Note (Signed)
BP Readings from Last 3 Encounters:  03/13/16 (!) 160/68  03/05/16 133/67  01/24/16 136/62    Lab Results  Component Value Date   NA 139 03/04/2016   K 3.9 03/04/2016   CREATININE 0.86 03/05/2016    Assessment: Comments: Blood pressure was previously well controlled but elevated at this visit. Patient is currently taking atenolol 50 mg daily and hydrochlorothiazide 25 mg daily.   Plan: Medications:  continue current medications Educational resources provided:  educated about healthy eating and exercise Other plans:  -Return to clinic in 1 month. If blood pressure continues to be elevated, consider increasing dose of hydrochlorothiazide.

## 2016-03-15 NOTE — Assessment & Plan Note (Signed)
Assessment Stable. She is currently using Symbicort daily and has not required her rescue inhaler in the past 1 month.  Plan Continue to monitor

## 2016-03-15 NOTE — Assessment & Plan Note (Addendum)
Assessment Patient has a history of colon cancer with resection in 2015. 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 She was previously given a referral to GI and has not gone yet.  Plan Referral to GI again

## 2016-03-15 NOTE — Assessment & Plan Note (Signed)
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 Based on patient's FRAX score her risk for a major osteoporotic fracture in the next 10 years is 7.6%. Risk for a hip fracture is 0.8% in the next 10 years.  Plan Will not start a bisphosphonate at this time Continue calcium supplementation

## 2016-03-16 NOTE — Progress Notes (Signed)
Internal Medicine Clinic Attending  Case discussed with Dr. Rathoreat the time of the visit. We reviewed the resident's history and exam and pertinent patient test results. I agree with the assessment, diagnosis, and plan of care documented in the resident's note.  

## 2016-04-02 ENCOUNTER — Encounter: Payer: Self-pay | Admitting: Cardiovascular Disease

## 2016-04-02 ENCOUNTER — Ambulatory Visit (INDEPENDENT_AMBULATORY_CARE_PROVIDER_SITE_OTHER): Payer: Medicare Other | Admitting: Cardiovascular Disease

## 2016-04-02 VITALS — BP 126/60 | HR 49 | Ht 62.0 in | Wt 296.4 lb

## 2016-04-02 DIAGNOSIS — I480 Paroxysmal atrial fibrillation: Secondary | ICD-10-CM

## 2016-04-02 DIAGNOSIS — Z79899 Other long term (current) drug therapy: Secondary | ICD-10-CM

## 2016-04-02 DIAGNOSIS — Z01818 Encounter for other preprocedural examination: Secondary | ICD-10-CM

## 2016-04-02 DIAGNOSIS — E785 Hyperlipidemia, unspecified: Secondary | ICD-10-CM

## 2016-04-02 DIAGNOSIS — I10 Essential (primary) hypertension: Secondary | ICD-10-CM | POA: Diagnosis not present

## 2016-04-02 MED ORDER — CARVEDILOL 6.25 MG PO TABS: 6.2500 mg | ORAL_TABLET | Freq: Two times a day (BID) | ORAL | 5 refills | Status: DC

## 2016-04-02 NOTE — Patient Instructions (Addendum)
Medication Instructions:  STOP ATENOLOL   START CARVEDILOL 6.25 MG TWICE A DAY  Labwork: NONE  Testing/Procedures: Your physician has requested that you have a lexiscan myoview. For further information please visit [email or web address]. Please follow instruction sheet, as given. 2 DAY STUDY  Follow-Up: Your physician recommends that you schedule a follow-up appointment in: [place] recommends that you schedule a follow-up appointment in: 2-4 J[place] D  If you need a refill on your cardiac medications before your next appointment, please call your pharmacy.

## 2016-04-02 NOTE — Progress Notes (Signed)
Cardiology Office Note   Date:  04/02/2016   ID:  Suzanne Macdonald, DOB 1951-02-21, MRN [id number]  PCP:  Shela Leff, MD  Cardiologist:   [name], MD   Chief Complaint  Patient presents with  . new patient eval.    post hosp  pt c/o SOB on exertion      History of Present Illness: Suzanne Macdonald is a 65 y.o. female with hypertension, hyperlipidemia, OSA, and morbid obesity status post gastric bypass surgery who presents for follow-up on paroxysmal atrial fibrillation after recent hospitalization.  Suzanne Macdonald was diagnosed with atrial fibrillation in 2014.  For unclear reasons, she has not been on anticoagulation.  She was admitted to the hospital 02/2016 with atrial fibrillation with rapid ventricular response with rates near 170.  She converted spontaneously to sinus rhythm.  She was started on flecainide and Eliquis and continued on atenolol. Echo that admission revealed LVEF 55-60%.  Suzanne Macdonald underwent cardiac catheterization in [place] on 06/02/14 prior to undergoing colon cancer surgery. Records were reviewed and she had no significant blockages at that time.  Suzanne Macdonald plans to have a hernia repair. Prior to discharge plans are made for her to have an exercise treadmill test on flecainide prior to having the surgery.  She has been feeling well since leaving the hospital.  She had two episodes of heart fluttering that were brief.  She denies chest pain, shortness of breath, lower extremity edema, orthopnea, PND,  lightheadedness or dizziness.  She has limited salt, sugar and fats and is trying to lose weight.  She is limited on exercise because she injured her left knee 10 years ago due to a mechanical fall.  She uses a cane for ambulation.     Past Medical History:  Diagnosis Date  . Asthma   . Atrial fibrillation ([place])   . Cervical cancer ([place]) 1972  . Colon cancer ([place])   . H/O gastric bypass   . Hyperlipidemia   . Hypertension   . Morbid obesity  ([place]) 03/04/2016  . Sleep apnea     Past Surgical History:  Procedure Laterality Date  . ABDOMINAL HYSTERECTOMY  1998   Salpingoophorectomy. For tumorous growth.  . BOWEL RESECTION  2015   [place], [place] by Dr. Stormy Fabian  . GASTRIC BYPASS  1992  . LAPAROSCOPIC GASTRIC BANDING     Placed 2013 and removed in 2014.     Current Outpatient Prescriptions  Medication Sig Dispense Refill  . acetaminophen (TYLENOL) 500 MG tablet Take 1,000 mg by mouth every 6 (six) hours as needed for mild pain or moderate pain.    Marland Kitchen albuterol (PROVENTIL HFA;VENTOLIN HFA) 108 (90 Base) MCG/ACT inhaler Inhale 1-2 puffs into the lungs every 6 (six) hours as needed for wheezing or shortness of breath.    Marland Kitchen apixaban (ELIQUIS) 5 MG TABS tablet Take 1 tablet (5 mg total) by mouth 2 (two) times daily. 180 tablet 1  . atenolol (TENORMIN) 50 MG tablet Take 1 tablet (50 mg total) by mouth daily. 30 tablet 1  . atorvastatin (LIPITOR) 10 MG tablet Take 1 tablet (10 mg total) by mouth daily. 30 tablet 1  . budesonide-formoterol (SYMBICORT) 160-4.5 MCG/ACT inhaler Inhale 2 puffs into the lungs 2 (two) times daily. 1 Inhaler 1  . cholecalciferol (VITAMIN D) 1000 units tablet Take 2,000 Units by mouth daily.     . cyanocobalamin 500 MCG tablet Take 1,000 mcg by mouth daily.     . flecainide (TAMBOCOR) 50 MG  tablet Take 1 tablet (50 mg total) by mouth 2 (two) times daily. 60 tablet 0  . hydrochlorothiazide (HYDRODIURIL) 25 MG tablet Take 1 tablet (25 mg total) by mouth daily. (Patient taking differently: Take 12.5 mg by mouth daily. ) 30 tablet 1  . Multiple Vitamins-Minerals (MULTIVITAMIN WITH MINERALS) tablet Take 1 tablet by mouth daily.    Marland Kitchen omeprazole (PRILOSEC) 40 MG capsule Take 1 capsule (40 mg total) by mouth daily. 30 capsule 1  . ondansetron (ZOFRAN-ODT) 8 MG disintegrating tablet Take 1 tablet (8 mg total) by mouth every 8 (eight) hours as needed for nausea or vomiting. 10 tablet 0  . zinc gluconate 50 MG tablet Take  50 mg by mouth daily.     No current facility-administered medications for this visit.     Allergies:   Iodine    Social History:  The patient  reports that she has quit smoking. Her smoking use included Cigarettes. She has a 60.00 pack-year smoking history. She has never used smokeless tobacco. She reports that she does not drink alcohol or use drugs.   Family History:  The patient's family history includes Diabetes in her father, mother, sister, and sister; Drug abuse in her brother; Early death in her brother; Heart disease in her father and mother; Stroke in her brother and father; Thyroid disease in her daughter, sister, and sister.    ROS:  Please see the history of present illness.   Otherwise, review of systems are positive for none.   All other systems are reviewed and negative.    PHYSICAL EXAM: VS:  BP 126/60 (BP Location: Left Wrist, Patient Position: Sitting, Cuff Size: Normal)   Pulse (!) 49   Ht '5\' 2"'$  (1.575 m)   Wt 296 lb 6.4 oz (134.4 kg)   BMI 54.21 kg/m  , BMI Body mass index is 54.21 kg/m. GENERAL:  Well appearing HEENT:  Pupils equal round and reactive, fundi not visualized, oral mucosa unremarkable NECK:  No jugular venous distention, waveform within normal limits, carotid upstroke brisk and symmetric, no bruits, no thyromegaly LYMPHATICS:  No cervical adenopathy LUNGS:  Clear to auscultation bilaterally HEART:  Bradycardic.  Regular rhythm.  PMI not displaced or sustained,S1 and S2 within normal limits, no S3, no S4, no clicks, no rubs, no murmurs ABD:  Flat, positive bowel sounds normal in frequency in pitch, no bruits, no rebound, no guarding, no midline pulsatile mass, no hepatomegaly, no splenomegaly EXT:  2 plus pulses throughout, no edema, no cyanosis no clubbing SKIN:  No rashes no nodules NEURO:  Cranial nerves II through XII grossly intact, motor grossly intact throughout PSYCH:  Cognitively intact, oriented to person place and time   EKG:  EKG is  ordered today. The ekg ordered today demonstrates sinus bradycardia rate 49 bpm.     Recent Labs: 01/24/2016: ALT 30 03/02/2016: B Natriuretic Peptide 98.9; TSH 1.964 03/04/2016: BUN 21; Magnesium 1.9; Potassium 3.9; Sodium 139 03/05/2016: Creatinine, Ser 0.86; Hemoglobin 12.1; Platelets 231   Echo 03/04/16: Study Conclusions  - Left ventricle: The cavity size was normal. Wall thickness was   normal. Systolic function was normal. The estimated ejection   fraction was in the range of 55% to 60%. Wall motion was normal;   there were no regional wall motion abnormalities. Doppler   parameters are consistent with abnormal left ventricular   relaxation (grade 1 diastolic dysfunction). - Aortic valve: There was no stenosis. - Mitral valve: Mildly calcified annulus. There was no significant  regurgitation. - Left atrium: The atrium was mildly dilated. - Right ventricle: The cavity size was normal. Systolic function   was normal. - Pulmonary arteries: No complete TR doppler jet so unable to   estimate PA systolic pressure. - Inferior vena cava: The vessel was normal in size. The   respirophasic diameter changes were in the normal range (>= 50%),   consistent with normal central venous pressure.  Impressions:  - Normal LV size with EF 55-60%. Normal RV size and systolic   function. No significant valvular abnormalities.  Lipid Panel    Component Value Date/Time   CHOL 227 (H) 01/24/2016 0952   TRIG 131 01/24/2016 0952   HDL 50 01/24/2016 0952   CHOLHDL 4.5 (H) 01/24/2016 0952   LDLCALC 151 (H) 01/24/2016 0952      Wt Readings from Last 3 Encounters:  04/02/16 296 lb 6.4 oz (134.4 kg)  03/13/16 293 lb (132.9 kg)  03/05/16 289 lb (131.1 kg)      ASSESSMENT AND PLAN:  # Paroxysmal atrial fibrillation: Ms. Emanuele is doing very well on flecainide.  She is scheduled for ETT but she walks with a cane.  We will switch this to a [employer].  Continue flecainide.  We will  stop atenolol and switch to carvedilol 6.'25mg'$  bid given that her heart rate is 49 bpm.  Continue Eliquis.  # Presurgical risk assessment:  Lexiscan Myoview as above.  She needs a hernia surgery and is not physically active enough to assess her pre-surgical risk.    # Hyperlipidemia: Lipids are above goal.  Repeat lipids at follow up.  Continue atorvastatin for now.  # Hypertension: BP is well-controlled.  However, we will switch atenolol to carvedilol due to bradycardia.   Current medicines are reviewed at length with the patient today.  The patient does not have concerns regarding medicines.  The following changes have been made:  Switch atenolol to carvedilol.  Labs/ tests ordered today include:  No orders of the defined types were placed in this encounter.    Disposition:   FU with Ahtziry Saathoff C. Oval Linsey, MD, [facility] in 3 months.  Follow up with pharmacy in 2-4 weeks.     This note was written with the assistance of speech recognition software.  Please excuse any transcriptional errors.  Signed, Xaden Kaufman C. Oval Linsey, MD, [facility]  04/02/2016 2:23 PM    [facility]

## 2016-04-03 ENCOUNTER — Other Ambulatory Visit: Payer: Self-pay | Admitting: Pulmonary Disease

## 2016-04-03 ENCOUNTER — Ambulatory Visit: Payer: Medicare Other | Admitting: Gastroenterology

## 2016-04-03 NOTE — Addendum Note (Signed)
Addended by: Hulan Fray on: 04/03/2016 09:37 PM   Modules accepted: Orders

## 2016-04-06 ENCOUNTER — Ambulatory Visit: Payer: Medicare Other | Admitting: Physician Assistant

## 2016-04-12 ENCOUNTER — Ambulatory Visit (HOSPITAL_COMMUNITY)
Admission: RE | Admit: 2016-04-12 | Discharge: 2016-04-12 | Disposition: A | Discharge status: Home / Self Care | Payer: Medicare Other | Source: Ambulatory Visit | Attending: Cardiology | Admitting: Cardiology

## 2016-04-12 DIAGNOSIS — G4733 Obstructive sleep apnea (adult) (pediatric): Secondary | ICD-10-CM | POA: Diagnosis not present

## 2016-04-12 DIAGNOSIS — R079 Chest pain, unspecified: Secondary | ICD-10-CM | POA: Insufficient documentation

## 2016-04-12 DIAGNOSIS — Z79899 Other long term (current) drug therapy: Secondary | ICD-10-CM | POA: Diagnosis not present

## 2016-04-12 DIAGNOSIS — I10 Essential (primary) hypertension: Secondary | ICD-10-CM | POA: Insufficient documentation

## 2016-04-12 DIAGNOSIS — Z87891 Personal history of nicotine dependence: Secondary | ICD-10-CM | POA: Insufficient documentation

## 2016-04-12 DIAGNOSIS — R0602 Shortness of breath: Secondary | ICD-10-CM | POA: Insufficient documentation

## 2016-04-12 DIAGNOSIS — E669 Obesity, unspecified: Secondary | ICD-10-CM | POA: Insufficient documentation

## 2016-04-12 DIAGNOSIS — Z01818 Encounter for other preprocedural examination: Secondary | ICD-10-CM | POA: Insufficient documentation

## 2016-04-12 DIAGNOSIS — R0609 Other forms of dyspnea: Secondary | ICD-10-CM | POA: Insufficient documentation

## 2016-04-12 DIAGNOSIS — Z8249 Family history of ischemic heart disease and other diseases of the circulatory system: Secondary | ICD-10-CM | POA: Insufficient documentation

## 2016-04-12 DIAGNOSIS — Z6841 Body Mass Index (BMI) 40.0 and over, adult: Secondary | ICD-10-CM | POA: Diagnosis not present

## 2016-04-12 DIAGNOSIS — R5383 Other fatigue: Secondary | ICD-10-CM | POA: Insufficient documentation

## 2016-04-12 MED ORDER — REGADENOSON 0.4 MG/5ML IV SOLN
0.4000 mg | Freq: Once | INTRAVENOUS | Status: AC
  Administered 2016-04-12: 0.4 mg via INTRAVENOUS

## 2016-04-12 MED ORDER — TECHNETIUM TC 99M TETROFOSMIN IV KIT
30.9000 | PACK | Freq: Once | INTRAVENOUS | Status: AC | PRN
  Administered 2016-04-12: 30.9 via INTRAVENOUS
  Filled 2016-04-12: qty 31

## 2016-04-13 ENCOUNTER — Ambulatory Visit (HOSPITAL_COMMUNITY)
Admission: RE | Admit: 2016-04-13 | Discharge: 2016-04-13 | Disposition: A | Discharge status: Home / Self Care | Payer: Medicare Other | Source: Ambulatory Visit | Attending: Cardiology | Admitting: Cardiology

## 2016-04-13 MED ORDER — TECHNETIUM TC 99M TETROFOSMIN IV KIT
28.9000 | PACK | Freq: Once | INTRAVENOUS | Status: AC | PRN
  Administered 2016-04-13: 28.9 via INTRAVENOUS

## 2016-04-15 LAB — MYOCARDIAL PERFUSION IMAGING
CHL CUP RESTING HR STRESS: 60 {beats}/min
CSEPPHR: 81 {beats}/min
LV dias vol: 95 mL (ref 46–106)
LVSYSVOL: 32 mL
SDS: 7
SRS: 0
SSS: 7
TID: 0.99

## 2016-04-17 ENCOUNTER — Telehealth: Payer: Self-pay | Admitting: Cardiovascular Disease

## 2016-04-17 NOTE — Telephone Encounter (Signed)
New Message  Pt call stating she was returning RN call. Please call back to discuss

## 2016-04-17 NOTE — Telephone Encounter (Signed)
Spoke to patient. Results given to stress test.patient verbalized understanding

## 2016-04-22 ENCOUNTER — Other Ambulatory Visit: Payer: Self-pay | Admitting: Pulmonary Disease

## 2016-04-24 ENCOUNTER — Encounter: Payer: Medicare Other | Admitting: Internal Medicine

## 2016-04-26 ENCOUNTER — Other Ambulatory Visit: Payer: Self-pay | Admitting: Cardiovascular Disease

## 2016-04-26 ENCOUNTER — Other Ambulatory Visit: Payer: Self-pay | Admitting: Internal Medicine

## 2016-04-26 NOTE — Telephone Encounter (Signed)
Please review for refill. Thanks!  

## 2016-04-27 ENCOUNTER — Ambulatory Visit (INDEPENDENT_AMBULATORY_CARE_PROVIDER_SITE_OTHER): Payer: Medicare Other | Admitting: Pharmacist Clinician (PhC)/ Clinical Pharmacy Specialist

## 2016-04-27 ENCOUNTER — Encounter: Payer: Self-pay | Admitting: Pharmacist Clinician (PhC)/ Clinical Pharmacy Specialist

## 2016-04-27 DIAGNOSIS — I10 Essential (primary) hypertension: Secondary | ICD-10-CM | POA: Diagnosis not present

## 2016-04-27 MED ORDER — CARVEDILOL 6.25 MG PO TABS: 9.3750 mg | ORAL_TABLET | Freq: Two times a day (BID) | ORAL | 1 refills | Status: DC

## 2016-04-27 MED ORDER — OMEPRAZOLE 40 MG PO CPDR: 40.0000 mg | DELAYED_RELEASE_CAPSULE | Freq: Every day | ORAL | 1 refills | Status: DC

## 2016-04-27 MED ORDER — HYDROCHLOROTHIAZIDE 25 MG PO TABS: 12.5000 mg | ORAL_TABLET | Freq: Every day | ORAL | 1 refills | Status: DC

## 2016-04-27 MED ORDER — FLECAINIDE ACETATE 50 MG PO TABS: 50.0000 mg | ORAL_TABLET | Freq: Two times a day (BID) | ORAL | 1 refills | Status: DC

## 2016-04-27 NOTE — Patient Instructions (Signed)
Call in 2 weeks to let us know what your blood pressure and heart rate are doing.  Also let us know if you are having regular break-thru atrial fibrillation  Your blood pressure today is 132/76  (goal is < 150/90)  Check your blood pressure at home daily and keep record of the readings.  Take your BP meds as follows:  Increase carvedilol to 9.375 mg (1.5 tablets of the 6.25 mg) twice daily  Continue with the 12.5 mg hydrochlorothiazide daily  Bring all of your meds, your BP cuff and your record of home blood pressures to your next appointment.  Exercise as you're able, try to walk approximately 30 minutes per day.  Keep salt intake to a minimum, especially watch canned and prepared boxed foods.  Eat more fresh fruits and vegetables and fewer canned items.  Avoid eating in fast food restaurants.    HOW TO TAKE YOUR BLOOD PRESSURE: . Rest 5 minutes before taking your blood pressure. .  Don't smoke or drink caffeinated beverages for at least 30 minutes before. . Take your blood pressure before (not after) you eat. . Sit comfortably with your back supported and both feet on the floor (don't cross your legs). . Elevate your arm to heart level on a table or a desk. . Use the proper sized cuff. It should fit smoothly and snugly around your bare upper arm. There should be enough room to slip a fingertip under the cuff. The bottom edge of the cuff should be 1 inch above the crease of the elbow. . Ideally, take 3 measurements at one sitting and record the average.

## 2016-04-27 NOTE — Assessment & Plan Note (Signed)
Blood pressure looks good today.  Patient does report a few more episodes of AF since switching to carvedilol.  Will increase her dose to 9.375 mg bid and see if this will decrease the AF without any significant lowering of BP or HR.  Have asked her to contact us in about 2 weeks to let us know how she is tolerating this dose.

## 2016-04-27 NOTE — Progress Notes (Signed)
04/27/2016 Suzanne Macdonald 02/27/1951 [id number]   HPI:  Suzanne Macdonald is a 65 y.o. female patient of Dr Oval Linsey, with a [place] below who presents today for hypertension clinic evaluation.  Suzanne Macdonald was seen by Dr. Oval Linsey on August 14 and switched from atenolol to carvedilol.  She had been having bradycardia, although her blood pressure was good at 126/60.  Since switching to carvedilol she has noticed her HR has increased such that most days it runs about 60 bpm.    Blood Pressure Goal:   150/90   Current Medications:  Carvedilol 6.25 mg bid, hctz 12.5 mg qd  Cardiac Hx:  Hypertension, hyperlipidemia, OSA, morbid obesity  Family Hx:  Father had multiple strokes, unsure at what age, patient reports first was when she was 47-20; mother died from "heart disease", 1 brother deceased from stroke.  Social Hx:  Not a current smoker, no alcohol, has given up caffeine  Diet:  Eats all meals at home, has gone to low/no fat, low/no sugar and no added sodium.  Admits her eating weakness is homemade breads.  Exercise:  Currently none, although she states she recently purchased an elliptical and is waiting on delivery.  She hopes to use daily  Home BP readings:  Did not bring home cuff, a wrist model about 65 years old.  States home readings range from 120-150, with only 2 readings > 150 in past week or two, believes diastolic all to be 78-29'F.    HR has picked up some, no longer any 40's, mostly low 60's.  Intolerances:   none  Wt Readings from Last 3 Encounters:  04/27/16 300 lb (136.1 kg)  04/12/16 296 lb (134.3 kg)  04/02/16 296 lb 6.4 oz (134.4 kg)   BP Readings from Last 3 Encounters:  04/27/16 132/76  04/02/16 126/60  03/13/16 (!) 160/68   Pulse Readings from Last 3 Encounters:  04/27/16 68  04/02/16 (!) 49  03/13/16 62    Current Outpatient Prescriptions  Medication Sig Dispense Refill  . acetaminophen (TYLENOL) 500 MG tablet Take 1,000 mg by mouth every 6 (six)  hours as needed for mild pain or moderate pain.    Marland Kitchen albuterol (PROVENTIL HFA;VENTOLIN HFA) 108 (90 Base) MCG/ACT inhaler Inhale 1-2 puffs into the lungs every 6 (six) hours as needed for wheezing or shortness of breath.    Marland Kitchen apixaban (ELIQUIS) 5 MG TABS tablet Take 1 tablet (5 mg total) by mouth 2 (two) times daily. 180 tablet 1  . atorvastatin (LIPITOR) 10 MG tablet take 1 tablet by mouth once daily 90 tablet 1  . budesonide-formoterol (SYMBICORT) 160-4.5 MCG/ACT inhaler Inhale 2 puffs into the lungs 2 (two) times daily. 1 Inhaler 1  . carvedilol (COREG) 6.25 MG tablet Take 1.5 tablets (9.375 mg total) by mouth 2 (two) times daily. 270 tablet 1  . cholecalciferol (VITAMIN D) 1000 units tablet Take 2,000 Units by mouth daily.     . cyanocobalamin 500 MCG tablet Take 1,000 mcg by mouth daily.     . flecainide (TAMBOCOR) 50 MG tablet Take 1 tablet (50 mg total) by mouth 2 (two) times daily. 180 tablet 1  . hydrochlorothiazide (HYDRODIURIL) 25 MG tablet Take 0.5 tablets (12.5 mg total) by mouth daily. 45 tablet 1  . Multiple Vitamins-Minerals (MULTIVITAMIN WITH MINERALS) tablet Take 1 tablet by mouth daily.    Marland Kitchen omeprazole (PRILOSEC) 40 MG capsule Take 1 capsule (40 mg total) by mouth daily. 90 capsule 1  . ondansetron (ZOFRAN-ODT) 8  MG disintegrating tablet Take 1 tablet (8 mg total) by mouth every 8 (eight) hours as needed for nausea or vomiting. 10 tablet 0  . zinc gluconate 50 MG tablet Take 50 mg by mouth daily.     No current facility-administered medications for this visit.     Allergies  Allergen Reactions  . Iodine Hives    Past Medical History:  Diagnosis Date  . Asthma   . Atrial fibrillation ([place])   . Cervical cancer ([place]) 1972  . Colon cancer ([place])   . H/O gastric bypass   . Hyperlipidemia   . Hypertension   . Morbid obesity ([place]) 03/04/2016  . Sleep apnea     Blood pressure 132/76, pulse 68, height '5\' 2"'$  (1.575 m), weight 300 lb (136.1 kg).  Assessment/Plan Blood  pressure looks good today.  Patient does report a few more episodes of AF since switching to carvedilol.  Will increase her dose to 9.375 mg bid and see if this will decrease the AF without any significant lowering of BP or HR.  Have asked her to contact us in about 2 weeks to let us know how she is tolerating this dose.    Tommy Medal PharmD CPP [facility]

## 2016-04-28 ENCOUNTER — Emergency Department (HOSPITAL_COMMUNITY): Payer: Medicare Other

## 2016-04-28 ENCOUNTER — Encounter (HOSPITAL_COMMUNITY): Payer: Self-pay

## 2016-04-28 ENCOUNTER — Inpatient Hospital Stay (HOSPITAL_COMMUNITY)
Admission: EM | Admit: 2016-04-28 | Discharge: 2016-05-06 | DRG: 330 | Disposition: A | Discharge status: Home / Self Care | Payer: Medicare Other | Attending: Internal Medicine | Admitting: Internal Medicine

## 2016-04-28 DIAGNOSIS — Z85038 Personal history of other malignant neoplasm of large intestine: Secondary | ICD-10-CM | POA: Diagnosis not present

## 2016-04-28 DIAGNOSIS — Z7901 Long term (current) use of anticoagulants: Secondary | ICD-10-CM | POA: Diagnosis not present

## 2016-04-28 DIAGNOSIS — J45998 Other asthma: Secondary | ICD-10-CM | POA: Diagnosis present

## 2016-04-28 DIAGNOSIS — Z8249 Family history of ischemic heart disease and other diseases of the circulatory system: Secondary | ICD-10-CM | POA: Diagnosis not present

## 2016-04-28 DIAGNOSIS — Z6841 Body Mass Index (BMI) 40.0 and over, adult: Secondary | ICD-10-CM

## 2016-04-28 DIAGNOSIS — K5909 Other constipation: Secondary | ICD-10-CM | POA: Diagnosis present

## 2016-04-28 DIAGNOSIS — K432 Incisional hernia without obstruction or gangrene: Secondary | ICD-10-CM | POA: Diagnosis not present

## 2016-04-28 DIAGNOSIS — G4733 Obstructive sleep apnea (adult) (pediatric): Secondary | ICD-10-CM | POA: Diagnosis present

## 2016-04-28 DIAGNOSIS — J45909 Unspecified asthma, uncomplicated: Secondary | ICD-10-CM | POA: Diagnosis not present

## 2016-04-28 DIAGNOSIS — R52 Pain, unspecified: Secondary | ICD-10-CM | POA: Diagnosis not present

## 2016-04-28 DIAGNOSIS — Z23 Encounter for immunization: Secondary | ICD-10-CM

## 2016-04-28 DIAGNOSIS — K430 Incisional hernia with obstruction, without gangrene: Secondary | ICD-10-CM | POA: Diagnosis not present

## 2016-04-28 DIAGNOSIS — E559 Vitamin D deficiency, unspecified: Secondary | ICD-10-CM | POA: Diagnosis present

## 2016-04-28 DIAGNOSIS — I10 Essential (primary) hypertension: Secondary | ICD-10-CM | POA: Diagnosis present

## 2016-04-28 DIAGNOSIS — I4891 Unspecified atrial fibrillation: Secondary | ICD-10-CM | POA: Diagnosis not present

## 2016-04-28 DIAGNOSIS — Z7951 Long term (current) use of inhaled steroids: Secondary | ICD-10-CM

## 2016-04-28 DIAGNOSIS — I700 Atherosclerosis of aorta: Secondary | ICD-10-CM | POA: Diagnosis not present

## 2016-04-28 DIAGNOSIS — I480 Paroxysmal atrial fibrillation: Secondary | ICD-10-CM | POA: Diagnosis present

## 2016-04-28 DIAGNOSIS — K439 Ventral hernia without obstruction or gangrene: Secondary | ICD-10-CM | POA: Diagnosis not present

## 2016-04-28 DIAGNOSIS — Z90722 Acquired absence of ovaries, bilateral: Secondary | ICD-10-CM | POA: Diagnosis not present

## 2016-04-28 DIAGNOSIS — Z87891 Personal history of nicotine dependence: Secondary | ICD-10-CM | POA: Diagnosis not present

## 2016-04-28 DIAGNOSIS — Z9884 Bariatric surgery status: Secondary | ICD-10-CM

## 2016-04-28 DIAGNOSIS — E785 Hyperlipidemia, unspecified: Secondary | ICD-10-CM | POA: Diagnosis present

## 2016-04-28 DIAGNOSIS — Z91048 Other nonmedicinal substance allergy status: Secondary | ICD-10-CM

## 2016-04-28 DIAGNOSIS — Z9221 Personal history of antineoplastic chemotherapy: Secondary | ICD-10-CM | POA: Diagnosis not present

## 2016-04-28 DIAGNOSIS — K660 Peritoneal adhesions (postprocedural) (postinfection): Secondary | ICD-10-CM | POA: Diagnosis present

## 2016-04-28 DIAGNOSIS — Z8541 Personal history of malignant neoplasm of cervix uteri: Secondary | ICD-10-CM

## 2016-04-28 DIAGNOSIS — K436 Other and unspecified ventral hernia with obstruction, without gangrene: Secondary | ICD-10-CM | POA: Diagnosis not present

## 2016-04-28 DIAGNOSIS — Z9889 Other specified postprocedural states: Secondary | ICD-10-CM | POA: Diagnosis not present

## 2016-04-28 DIAGNOSIS — Z79899 Other long term (current) drug therapy: Secondary | ICD-10-CM

## 2016-04-28 DIAGNOSIS — K469 Unspecified abdominal hernia without obstruction or gangrene: Secondary | ICD-10-CM | POA: Diagnosis not present

## 2016-04-28 DIAGNOSIS — K460 Unspecified abdominal hernia with obstruction, without gangrene: Secondary | ICD-10-CM | POA: Diagnosis present

## 2016-04-28 LAB — URINALYSIS, ROUTINE W REFLEX MICROSCOPIC
BILIRUBIN URINE: NEGATIVE
GLUCOSE, UA: NEGATIVE mg/dL
Hgb urine dipstick: NEGATIVE
KETONES UR: NEGATIVE mg/dL
Leukocytes, UA: NEGATIVE
NITRITE: POSITIVE — AB
PH: 5.5 (ref 5.0–8.0)
PROTEIN: NEGATIVE mg/dL
Specific Gravity, Urine: 1.021 (ref 1.005–1.030)

## 2016-04-28 LAB — COMPREHENSIVE METABOLIC PANEL
ALBUMIN: 3.5 g/dL (ref 3.5–5.0)
ALK PHOS: 67 U/L (ref 38–126)
ALT: 46 U/L (ref 14–54)
AST: 43 U/L — AB (ref 15–41)
Anion gap: 6 (ref 5–15)
BILIRUBIN TOTAL: 0.5 mg/dL (ref 0.3–1.2)
BUN: 11 mg/dL (ref 6–20)
CO2: 22 mmol/L (ref 22–32)
CREATININE: 0.78 mg/dL (ref 0.44–1.00)
Calcium: 8.5 mg/dL — ABNORMAL LOW (ref 8.9–10.3)
Chloride: 109 mmol/L (ref 101–111)
GFR calc Af Amer: >60 mL/min (ref >60)
GLUCOSE: 105 mg/dL — AB (ref 65–99)
POTASSIUM: 3.7 mmol/L (ref 3.5–5.1)
Sodium: 137 mmol/L (ref 135–145)
TOTAL PROTEIN: 5.9 g/dL — AB (ref 6.5–8.1)

## 2016-04-28 LAB — URINE MICROSCOPIC-ADD ON

## 2016-04-28 LAB — CBC
HEMATOCRIT: 40.7 % (ref 36.0–46.0)
Hemoglobin: 12.8 g/dL (ref 12.0–15.0)
MCH: 25.1 pg — ABNORMAL LOW (ref 26.0–34.0)
MCHC: 31.4 g/dL (ref 30.0–36.0)
MCV: 80 fL (ref 78.0–100.0)
PLATELETS: 221 10*3/uL (ref 150–400)
RBC: 5.09 MIL/uL (ref 3.87–5.11)
RDW: 15.5 % (ref 11.5–15.5)
WBC: 8.9 10*3/uL (ref 4.0–10.5)

## 2016-04-28 LAB — LIPASE, BLOOD: Lipase: 19 U/L (ref 11–51)

## 2016-04-28 MED ORDER — ONDANSETRON 4 MG PO TBDP
ORAL_TABLET | ORAL | Status: AC
  Filled 2016-04-28: qty 1

## 2016-04-28 MED ORDER — MORPHINE SULFATE (PF) 2 MG/ML IV SOLN
2.0000 mg | Freq: Once | INTRAVENOUS | Status: AC
  Administered 2016-04-28: 2 mg via INTRAVENOUS
  Filled 2016-04-28: qty 1

## 2016-04-28 MED ORDER — ONDANSETRON HCL 4 MG/2ML IJ SOLN
4.0000 mg | Freq: Once | INTRAMUSCULAR | Status: AC
  Administered 2016-04-28: 4 mg via INTRAVENOUS
  Filled 2016-04-28: qty 2

## 2016-04-28 MED ORDER — ONDANSETRON HCL 4 MG/2ML IJ SOLN
4.0000 mg | Freq: Once | INTRAMUSCULAR | Status: AC
  Administered 2016-04-28: 4 mg via INTRAVENOUS

## 2016-04-28 MED ORDER — BARIUM SULFATE 2.1 % PO SUSP
ORAL | Status: AC
  Administered 2016-04-28: 20:00:00
  Filled 2016-04-28: qty 2

## 2016-04-28 MED ORDER — ONDANSETRON 4 MG PO TBDP
4.0000 mg | ORAL_TABLET | Freq: Once | ORAL | Status: AC | PRN
  Administered 2016-04-28: 4 mg via ORAL

## 2016-04-28 MED ORDER — SODIUM CHLORIDE 0.9 % IV BOLUS (SEPSIS)
500.0000 mL | Freq: Once | INTRAVENOUS | Status: AC
  Administered 2016-04-28: 500 mL via INTRAVENOUS

## 2016-04-28 NOTE — ED Notes (Signed)
Pt refused IV placement requesting her port be accessed instead.

## 2016-04-28 NOTE — ED Triage Notes (Signed)
Pt. Has had abdominal pain with lower back pain for 3 days ago. And the pain is sharp .  She feels nauseated but has not vomited. She denies any diarrhea  Has constipation.  She had a gastric bypass done years ago and after that she developed a hernia behind the mesh and she is going to have that repaired but had to be cleared by cardiology which she has been.   The pain has become worse.; Skin is pink, warm and dry.

## 2016-04-28 NOTE — ED Notes (Signed)
Paged General Surgery -TY

## 2016-04-28 NOTE — ED Provider Notes (Addendum)
[facility] DEPT Provider Note   CSN: [id number] Arrival date & time: 04/28/16  1337     History   Chief Complaint Chief Complaint  Patient presents with  . Abdominal Pain  . Nausea    HPI Suzanne Macdonald is a 65 y.o. female.  HPI  65 year old morbidly obese female history of gastric bypass surgery, atrial fibrillation, colon cancer, known hernias presents today complaining of abdominal pain that has been present for 2 days. She states that she had a sudden onset of pain and upper abdominal pain that began 2 days ago. She has had some nausea associated with it but no vomiting. She has been eating and drinking without difficulty. She says she has been stooling fine until today when she has felt that she has not been able to have a bowel movement. Inset of the pain was not associated with anything the patient could define. She states that it did come on fairly abruptly. She describes it as similar episodes of pain that she has been evaluated for which were thought to be associated with her hernia. She denies any headache, neck pain, chest pain, dyspnea, urinary tract infection symptoms, fever, chills, vomiting, or diarrhea. She reports that she has been taking her medications as prescribed. She states that she was seen 2 days ago by cardiology and was cleared for surgery but has not yet seen the surgeon. She states that she is fairly new to this area since being seen in the internal medicine clinic.  Past Medical History:  Diagnosis Date  . Asthma   . Atrial fibrillation ([place])   . Cervical cancer ([place]) 1972  . Colon cancer ([place])   . H/O gastric bypass   . Hyperlipidemia   . Hypertension   . Morbid obesity ([place]) 03/04/2016  . Sleep apnea     Patient Active Problem List   Diagnosis Date Noted  . Osteopenia 03/15/2016  . Atrial fibrillation with tachycardic ventricular rate ([place])   . Asthma 03/02/2016  . Prediabetes 01/25/2016  . Essential hypertension 01/25/2016  . Vitamin D  deficiency 01/25/2016  . 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 Vitamin B12 deficiency 01/25/2016  . Paroxysmal atrial fibrillation ([place]) 01/25/2016  . Health care maintenance 01/25/2016  . History of colon cancer 01/25/2016  . Ventral hernia 01/25/2016  . History of cervical cancer 01/25/2016    Past Surgical History:  Procedure Laterality Date  . ABDOMINAL HYSTERECTOMY  1998   Salpingoophorectomy. For tumorous growth.  . BOWEL RESECTION  2015   [place], [place] by Dr. Stormy Fabian  . GASTRIC BYPASS  1992  . LAPAROSCOPIC GASTRIC BANDING     Placed 2013 and removed in 2014.    OB History    No data available       Home Medications    Prior to Admission medications   Medication Sig Start Date End Date Taking? Authorizing Provider  acetaminophen (TYLENOL) 500 MG tablet Take 1,000 mg by mouth every 6 (six) hours as needed for mild pain or moderate pain.    Historical Provider, MD  albuterol (PROVENTIL HFA;VENTOLIN HFA) 108 (90 Base) MCG/ACT inhaler Inhale 1-2 puffs into the lungs every 6 (six) hours as needed for wheezing or shortness of breath.    Historical Provider, MD  apixaban (ELIQUIS) 5 MG TABS tablet Take 1 tablet (5 mg total) by mouth 2 (two) times daily. 03/13/16   Shela Leff, MD  atorvastatin (LIPITOR) 10 MG tablet take 1 tablet by mouth once daily 04/04/16   Shela Leff,  MD  budesonide-formoterol (SYMBICORT) 160-4.5 MCG/ACT inhaler Inhale 2 puffs into the lungs 2 (two) times daily. 01/24/16   Milagros Loll, MD  carvedilol (COREG) 6.25 MG tablet Take 1.5 tablets (9.375 mg total) by mouth 2 (two) times daily. 04/27/16   [name], MD  cholecalciferol (VITAMIN D) 1000 units tablet Take 2,000 Units by mouth daily.     Historical Provider, MD  cyanocobalamin 500 MCG tablet Take 1,000 mcg by mouth daily.     Historical Provider, MD  flecainide (TAMBOCOR) 50 MG tablet Take 1 tablet (50 mg total) by mouth 2 (two) times daily. 04/27/16   [name], MD    hydrochlorothiazide (HYDRODIURIL) 25 MG tablet Take 0.5 tablets (12.5 mg total) by mouth daily. 04/27/16   [name], MD  Multiple Vitamins-Minerals (MULTIVITAMIN WITH MINERALS) tablet Take 1 tablet by mouth daily.    Historical Provider, MD  omeprazole (PRILOSEC) 40 MG capsule Take 1 capsule (40 mg total) by mouth daily. 04/27/16   [name], MD  ondansetron (ZOFRAN-ODT) 8 MG disintegrating tablet Take 1 tablet (8 mg total) by mouth every 8 (eight) hours as needed for nausea or vomiting. 01/22/16   Davonna Belling, MD  zinc gluconate 50 MG tablet Take 50 mg by mouth daily.    Historical Provider, MD    Family History Family History  Problem Relation Age of Onset  . Heart disease Mother   . Diabetes Mother   . Heart disease Father   . Stroke Father   . Diabetes Father   . Diabetes Sister   . Thyroid disease Sister   . Early death Brother     Killed by drunk driver  . Heart attack Brother   . Thyroid disease Daughter   . Diabetes Sister   . Thyroid disease Sister   . Stroke Brother   . Drug abuse Brother   . Heart attack Brother     Social History Social History  Substance Use Topics  . Smoking status: Former Smoker    Packs/day: 1.50    Years: 40.00    Types: Cigarettes  . Smokeless tobacco: Never Used  . Alcohol use No     Comment: a beer every now and then     Allergies   Iodine   Review of Systems Review of Systems  All other systems reviewed and are negative.    Physical Exam Updated Vital Signs BP 136/72   Pulse 65   Temp 98 F (36.7 C) (Oral)   Resp 20   Ht '5\' 2"'$  (1.575 m)   Wt 136.1 kg   SpO2 97%   BMI 54.87 kg/m   Physical Exam  Constitutional: She is oriented to person, place, and time. She appears well-developed and well-nourished. No distress.  HENT:  Head: Normocephalic and atraumatic.  Right Ear: External ear normal.  Left Ear: External ear normal.  Mouth/Throat: Oropharynx is clear and moist.  Eyes: EOM are normal. Pupils  are equal, round, and reactive to light.  Neck: Normal range of motion. Neck supple.  Cardiovascular: Normal rate, regular rhythm, normal heart sounds and intact distal pulses.   Pulmonary/Chest: Effort normal and breath sounds normal.  Abdominal: Soft. She exhibits no distension. There is tenderness.    Musculoskeletal: Normal range of motion.  Neurological: She is alert and oriented to person, place, and time.  Skin: Skin is warm and dry. Capillary refill takes less than 2 seconds.  Nursing note and vitals reviewed.    ED Treatments / Results  Labs (all labs ordered are listed, but only abnormal results are displayed) Labs Reviewed  CBC - Abnormal; Notable for the following:       Result Value   MCH 25.1 (*)    All other components within normal limits  LIPASE, BLOOD  COMPREHENSIVE METABOLIC PANEL  URINALYSIS, ROUTINE W REFLEX MICROSCOPIC (NOT AT [facility])    EKG  EKG Interpretation  Date/Time:  Saturday April 28 2016 15:35:30 EDT Ventricular Rate:  61 PR Interval:    QRS Duration: 88 QT Interval:  450 QTC Calculation: 454 R Axis:   72 Text Interpretation:  Sinus rhythm Nonspecific T abnormalities, lateral leads Confirmed by Gracieann Stannard MD, Andee Poles [phone]) on 04/28/2016 4:52:29 PM       Radiology Ct Abdomen Pelvis Wo Contrast  Result Date: 04/28/2016 CLINICAL DATA:  Generalized abdominal pain EXAM: CT ABDOMEN AND PELVIS WITHOUT CONTRAST TECHNIQUE: Multidetector CT imaging of the abdomen and pelvis was performed following the standard protocol without IV contrast. COMPARISON:  10/17/2015 FINDINGS: Lower chest: The lung bases are clear. No pleural or pericardial effusion. Hepatobiliary: Hepatic steatosis is identified. The gallbladder appears normal. Pancreas: No inflammation or mass identified. Spleen: The spleen appears normal. Adrenals/Urinary Tract: Normal appearance of the right adrenal gland. Stable appearance of a nodule in the left adrenal gland measuring 1.2 cm. No renal  mass or obstructive uropathy. The urinary bladder appears normal. Stomach/Bowel: There are postoperative changes involving the stomach compatible with previous gastric bypass. There is no pathologic dilatation of the small bowel loops. Enteric contrast material is identified up to the level of the proximal transverse colon. Within the left upper quadrant of the abdomen there is a ventral wall hernia which contains a loop of large bowel. New inflammatory changes are noted surrounding this loop of bowel as well as the proximal afferent segment of colon, image 33 of series 2. The afferent loop exhibits abnormal wall thickening with surrounding inflammatory changes as well as several foci of gas within the bowel wall. Vascular/Lymphatic: Calcified atherosclerotic disease involves the abdominal aorta. No aneurysm. No enlarged retroperitoneal or mesenteric adenopathy. No enlarged pelvic or inguinal lymph nodes. Reproductive: Status post hysterectomy. No adnexal masses. Other: No free fluid or abnormal fluid collections. Musculoskeletal: No aggressive lytic or sclerotic bone lesions. IMPRESSION: 1. Again noted is a left upper quadrant ventral abdominal wall hernia which contains a loop of colon. There are inflammatory changes including wall thickening and fat stranding involving the herniated loop of bowel as well as the more proximal afferent segment of bowel. Additionally, there is a suggestion of several foci of pneumatosis involving the afferent bowel loop. Cannot rule out incarcerated hernia. 2. Aortic atherosclerosis Electronically Signed   By: Kerby Moors M.D.   On: 04/28/2016 19:49    Procedures Procedures (including critical care time)  Medications Ordered in ED Medications  sodium chloride 0.9 % bolus 500 mL (not administered)  morphine 2 MG/ML injection 2 mg (not administered)  ondansetron (ZOFRAN-ODT) disintegrating tablet 4 mg (4 mg Oral Given 04/28/16 1432)     Initial Impression / Assessment  and Plan / ED Course  I have reviewed the triage vital signs and the nursing notes.  Pertinent labs & imaging results that were available during my care of the patient were reviewed by me and considered in my medical decision making (see chart for details).  Clinical Course  Value Comment By Time  CT ABDOMEN PELVIS WO CONTRAST (Reviewed) Pattricia Boss, MD 09/09 2038  CT reviewed  Patient resting comfortably she  has required morphine 2 for pain. She received Zofran for nausea but has not vomited. Patient care discussed with Dr. Windle Guard, on-call for general surgery, and she will be in to evaluate Dr. Kae Heller saw and evaluated.   Discussed with Dr. Posey Pronto and will admit to the teaching service Final Clinical Impressions(s) / ED Diagnoses   Final diagnoses:  Incarcerated hernia    New Prescriptions New Prescriptions   No medications on file     Pattricia Boss, MD 04/28/16 2215    Pattricia Boss, MD 04/28/16 2312

## 2016-04-28 NOTE — Consult Note (Signed)
Surgical Consultation  CC: Abdominal pain   HPI: Mrs. Luera began experiencing general abdominal pain about 3 days ago. Associated nausea but no emesis. Continues to have daily bowel movements but does note they were harder recently. Her surgical history is notable for open gastric bypass, colon resection for cancer a year ago, and per her, 5 hernia repairs with some mesh involved. Her CT shows numerous abdominal wall hernias, one of which contains a loop of transverse colon. This was present on CT in February. At that time she appeared obstructed. Currently her scan shows more inflammation around the chronically incarcerated bowel, and there is concern for some pneumatosis of the bowel proximally. She has been afebrile,; she denies chest pain, shortness of breath, cough, weakness or weight loss. Her medical history is notable for afib and HTN, OSA. She is on eliquis which she took this morning, flecainide and coreg. She recently underwent cardiac eval anticipating an upcoming meeting with [place] for elective hernia repair.   Allergies  Allergen Reactions  . Iodine Hives    Past Medical History:  Diagnosis Date  . Asthma   . Atrial fibrillation ([place])   . Cervical cancer ([place]) 1972  . Colon cancer ([place])   . H/O gastric bypass   . Hyperlipidemia   . Hypertension   . Morbid obesity ([place]) 03/04/2016  . Sleep apnea     Past Surgical History:  Procedure Laterality Date  . ABDOMINAL HYSTERECTOMY  1998   Salpingoophorectomy. For tumorous growth.  . BOWEL RESECTION  2015   [place], [place] by Dr. Stormy Fabian  . GASTRIC BYPASS  1992  . LAPAROSCOPIC GASTRIC BANDING     Placed 2013 and removed in 2014.    Family History  Problem Relation Age of Onset  . Heart disease Mother   . Diabetes Mother   . Heart disease Father   . Stroke Father   . Diabetes Father   . Diabetes Sister   . Thyroid disease Sister   . Early death Brother     Killed by drunk driver  . Heart attack Brother   . Thyroid  disease Daughter   . Diabetes Sister   . Thyroid disease Sister   . Stroke Brother   . Drug abuse Brother   . Heart attack Brother     Social History   Social History  . Marital status: Married    Spouse name: N/A  . Number of children: N/A  . Years of education: N/A   Social History Main Topics  . Smoking status: Former Smoker    Packs/day: 1.50    Years: 40.00    Types: Cigarettes  . Smokeless tobacco: Never Used  . Alcohol use No     Comment: a beer every now and then  . Drug use: No  . Sexual activity: No   Other Topics Concern  . None   Social History Narrative  . None    No current facility-administered medications on file prior to encounter.    Current Outpatient Prescriptions on File Prior to Encounter  Medication Sig Dispense Refill  . acetaminophen (TYLENOL) 500 MG tablet Take 1,000 mg by mouth every 6 (six) hours as needed for mild pain or moderate pain.    Marland Kitchen albuterol (PROVENTIL HFA;VENTOLIN HFA) 108 (90 Base) MCG/ACT inhaler Inhale 1-2 puffs into the lungs every 6 (six) hours as needed for wheezing or shortness of breath.    Marland Kitchen apixaban (ELIQUIS) 5 MG TABS tablet Take 1 tablet (5 mg total) by  mouth 2 (two) times daily. 180 tablet 1  . atorvastatin (LIPITOR) 10 MG tablet take 1 tablet by mouth once daily 90 tablet 1  . budesonide-formoterol (SYMBICORT) 160-4.5 MCG/ACT inhaler Inhale 2 puffs into the lungs 2 (two) times daily. 1 Inhaler 1  . carvedilol (COREG) 6.25 MG tablet Take 1.5 tablets (9.375 mg total) by mouth 2 (two) times daily. 270 tablet 1  . cholecalciferol (VITAMIN D) 1000 units tablet Take 2,000 Units by mouth daily.     . cyanocobalamin 500 MCG tablet Take 1,000 mcg by mouth daily.     . flecainide (TAMBOCOR) 50 MG tablet Take 1 tablet (50 mg total) by mouth 2 (two) times daily. 180 tablet 1  . hydrochlorothiazide (HYDRODIURIL) 25 MG tablet Take 0.5 tablets (12.5 mg total) by mouth daily. 45 tablet 1  . omeprazole (PRILOSEC) 40 MG capsule Take  1 capsule (40 mg total) by mouth daily. 90 capsule 1  . ondansetron (ZOFRAN-ODT) 8 MG disintegrating tablet Take 1 tablet (8 mg total) by mouth every 8 (eight) hours as needed for nausea or vomiting. (Patient not taking: Reported on 04/28/2016) 10 tablet 0    Review of Systems: a complete, 10pt review of systems was completed with pertinent positives and negatives as documented in the HPI.   Physical Exam: Vitals:   04/28/16 1949 04/28/16 2055  BP: 136/68 133/64  Pulse: 65 60  Resp: 13 14  Temp: 98.6 F (37 C)    Gen: A&Ox3, no distres. Obese woman.  H&N: normocephalic, atraumatic, EOMI. Neck supple without mass or thyromegaly  Chest: unlabored respirations. RRR with palpable distal pulses Abdomen: obese, soft, several points of tenderness corresponding to CT locations of hernias. The bowel-containing hernia is not really palpable. She does not have rebound or guarding, no peritoneal signs. Multiple surgical scars.  Extremities: warm, without edema, no deformities  Neuro: grossly intact Psych: appropriate mood and affect   CBC    Component Value Date/Time   WBC 8.9 04/28/2016 1439   RBC 5.09 04/28/2016 1439   HGB 12.8 04/28/2016 1439   HCT 40.7 04/28/2016 1439   HCT 37.3 01/24/2016 0952   PLT 221 04/28/2016 1439   PLT 279 01/24/2016 0952   MCV 80.0 04/28/2016 1439   MCV 77 (L) 01/24/2016 0952   MCH 25.1 (L) 04/28/2016 1439   MCHC 31.4 04/28/2016 1439   RDW 15.5 04/28/2016 1439   RDW 18.4 (H) 01/24/2016 0952   LYMPHSABS 2.4 01/21/2016 2325   MONOABS 0.8 01/21/2016 2325   EOSABS 0.2 01/21/2016 2325   BASOSABS 0.0 01/21/2016 2325    CMP Latest Ref Rng & Units 04/28/2016 03/05/2016 03/04/2016  Glucose 65 - 99 mg/dL 105(H) - 108(H)  BUN 6 - 20 mg/dL 11 - 21(H)  Creatinine 0.44 - 1.00 mg/dL 0.78 0.86 0.89  Sodium 135 - 145 mmol/L 137 - 139  Potassium 3.5 - 5.1 mmol/L 3.7 - 3.9  Chloride 101 - 111 mmol/L 109 - 106  CO2 22 - 32 mmol/L 22 - 26  Calcium 8.9 - 10.3 mg/dL 8.5(L) -  9.1  Total Protein 6.5 - 8.1 g/dL 5.9(L) - -  Total Bilirubin 0.3 - 1.2 mg/dL 0.5 - -  Alkaline Phos 38 - 126 U/L 67 - -  AST 15 - 41 U/L 43(H) - -  ALT 14 - 54 U/L 46 - -     Imaging: CLINICAL DATA:  Generalized abdominal pain  EXAM: CT ABDOMEN AND PELVIS WITHOUT CONTRAST  TECHNIQUE: Multidetector CT imaging of the abdomen  and pelvis was performed following the standard protocol without IV contrast.  COMPARISON:  10/17/2015  FINDINGS: Lower chest: The lung bases are clear. No pleural or pericardial effusion.  Hepatobiliary: Hepatic steatosis is identified. The gallbladder appears normal.  Pancreas: No inflammation or mass identified.  Spleen: The spleen appears normal.  Adrenals/Urinary Tract: Normal appearance of the right adrenal gland. Stable appearance of a nodule in the left adrenal gland measuring 1.2 cm. No renal mass or obstructive uropathy. The urinary bladder appears normal.  Stomach/Bowel: There are postoperative changes involving the stomach compatible with previous gastric bypass. There is no pathologic dilatation of the small bowel loops. Enteric contrast material is identified up to the level of the proximal transverse colon. Within the left upper quadrant of the abdomen there is a ventral wall hernia which contains a loop of large bowel. New inflammatory changes are noted surrounding this loop of bowel as well as the proximal afferent segment of colon, image 33 of series 2. The afferent loop exhibits abnormal wall thickening with surrounding inflammatory changes as well as several foci of gas within the bowel wall.  Vascular/Lymphatic: Calcified atherosclerotic disease involves the abdominal aorta. No aneurysm. No enlarged retroperitoneal or mesenteric adenopathy. No enlarged pelvic or inguinal lymph nodes.  Reproductive: Status post hysterectomy. No adnexal masses.  Other: No free fluid or abnormal fluid  collections.  Musculoskeletal: No aggressive lytic or sclerotic bone lesions.  IMPRESSION: 1. Again noted is a left upper quadrant ventral abdominal wall hernia which contains a loop of colon. There are inflammatory changes including wall thickening and fat stranding involving the herniated loop of bowel as well as the more proximal afferent segment of bowel. Additionally, there is a suggestion of several foci of pneumatosis involving the afferent bowel loop. Cannot rule out incarcerated hernia. 2. Aortic atherosclerosis   Electronically Signed   By: Kerby Moors M.D.   On: 04/28/2016 19:49  A/P: Chronically incarcerated ventral hernia in a medically and surgically complex morbidly obese woman. Now with inflammatory changes on Ct. She will likely need this repaired this hospitalization, but currently she does not have any clinical evidence of ischemia/strangulation aside from the Ct. No leukocytosis, acidosis, or AKI, and no peritonitis. She is high risk due to her comorbidities and habitus. Furthermore, she is on eliquis which should ideally be held at least 48h before surgery. Recommend admission, NPO, fluid resuscitation, hold eliquis and start heparin drip, serial exams. Plan for surgery early in the week unless she worsens clinically.    Romana Juniper, MD [facility], [place] Pager [phone]

## 2016-04-29 ENCOUNTER — Encounter (HOSPITAL_COMMUNITY): Payer: Self-pay | Admitting: General Practice

## 2016-04-29 DIAGNOSIS — Z85038 Personal history of other malignant neoplasm of large intestine: Secondary | ICD-10-CM

## 2016-04-29 DIAGNOSIS — I10 Essential (primary) hypertension: Secondary | ICD-10-CM

## 2016-04-29 DIAGNOSIS — Z87891 Personal history of nicotine dependence: Secondary | ICD-10-CM

## 2016-04-29 DIAGNOSIS — I480 Paroxysmal atrial fibrillation: Secondary | ICD-10-CM

## 2016-04-29 DIAGNOSIS — J45909 Unspecified asthma, uncomplicated: Secondary | ICD-10-CM

## 2016-04-29 DIAGNOSIS — Z9884 Bariatric surgery status: Secondary | ICD-10-CM

## 2016-04-29 DIAGNOSIS — K432 Incisional hernia without obstruction or gangrene: Secondary | ICD-10-CM

## 2016-04-29 LAB — CBC
HEMATOCRIT: 37.6 % (ref 36.0–46.0)
HEMOGLOBIN: 11.4 g/dL — AB (ref 12.0–15.0)
MCH: 24.8 pg — AB (ref 26.0–34.0)
MCHC: 30.3 g/dL (ref 30.0–36.0)
MCV: 81.7 fL (ref 78.0–100.0)
Platelets: 212 10*3/uL (ref 150–400)
RBC: 4.6 MIL/uL (ref 3.87–5.11)
RDW: 15.7 % — ABNORMAL HIGH (ref 11.5–15.5)
WBC: 6 10*3/uL (ref 4.0–10.5)

## 2016-04-29 LAB — BASIC METABOLIC PANEL
ANION GAP: 5 (ref 5–15)
BUN: 7 mg/dL (ref 6–20)
CALCIUM: 8.4 mg/dL — AB (ref 8.9–10.3)
CO2: 27 mmol/L (ref 22–32)
Chloride: 106 mmol/L (ref 101–111)
Creatinine, Ser: 0.85 mg/dL (ref 0.44–1.00)
GFR calc Af Amer: >60 mL/min (ref >60)
GFR calc non Af Amer: >60 mL/min (ref >60)
GLUCOSE: 144 mg/dL — AB (ref 65–99)
POTASSIUM: 4.6 mmol/L (ref 3.5–5.1)
Sodium: 138 mmol/L (ref 135–145)

## 2016-04-29 LAB — LACTIC ACID, PLASMA: Lactic Acid, Venous: 0.9 mmol/L (ref 0.5–1.9)

## 2016-04-29 MED ORDER — TRAZODONE HCL 100 MG PO TABS
100.0000 mg | ORAL_TABLET | Freq: Once | ORAL | Status: AC
  Administered 2016-04-29: 100 mg via ORAL
  Filled 2016-04-29: qty 1

## 2016-04-29 MED ORDER — PANTOPRAZOLE SODIUM 40 MG PO TBEC
40.0000 mg | DELAYED_RELEASE_TABLET | Freq: Every day | ORAL | Status: DC
  Administered 2016-04-29 – 2016-05-06 (×5): 40 mg via ORAL
  Filled 2016-04-29 (×6): qty 1

## 2016-04-29 MED ORDER — KCL IN DEXTROSE-NACL 40-5-0.45 MEQ/L-%-% IV SOLN
INTRAVENOUS | Status: DC
  Administered 2016-04-29: 02:00:00 via INTRAVENOUS
  Filled 2016-04-29 (×3): qty 1000

## 2016-04-29 MED ORDER — VITAMIN B-12 1000 MCG PO TABS
1000.0000 ug | ORAL_TABLET | Freq: Every day | ORAL | Status: DC
  Administered 2016-04-29 – 2016-05-02 (×3): 1000 ug via ORAL
  Filled 2016-04-29 (×7): qty 1

## 2016-04-29 MED ORDER — FLECAINIDE ACETATE 50 MG PO TABS
50.0000 mg | ORAL_TABLET | Freq: Two times a day (BID) | ORAL | Status: DC
  Administered 2016-04-29 – 2016-05-06 (×15): 50 mg via ORAL
  Filled 2016-04-29 (×17): qty 1

## 2016-04-29 MED ORDER — MOMETASONE FURO-FORMOTEROL FUM 200-5 MCG/ACT IN AERO
2.0000 | INHALATION_SPRAY | Freq: Two times a day (BID) | RESPIRATORY_TRACT | Status: DC
  Administered 2016-04-29 – 2016-05-06 (×13): 2 via RESPIRATORY_TRACT
  Filled 2016-04-29 (×3): qty 8.8

## 2016-04-29 MED ORDER — HEPARIN (PORCINE) IN NACL 100-0.45 UNIT/ML-% IJ SOLN
1400.0000 [IU]/h | INTRAMUSCULAR | Status: DC
  Administered 2016-04-29: 1400 [IU]/h via INTRAVENOUS
  Filled 2016-04-29: qty 250

## 2016-04-29 MED ORDER — VITAMIN D 1000 UNITS PO TABS
2000.0000 [IU] | ORAL_TABLET | Freq: Every day | ORAL | Status: DC
  Administered 2016-04-29 – 2016-05-02 (×3): 2000 [IU] via ORAL
  Filled 2016-04-29 (×6): qty 2

## 2016-04-29 MED ORDER — ALBUTEROL SULFATE (2.5 MG/3ML) 0.083% IN NEBU: 3.0000 mL | INHALATION_SOLUTION | Freq: Four times a day (QID) | RESPIRATORY_TRACT | Status: DC | PRN

## 2016-04-29 MED ORDER — ENOXAPARIN SODIUM 80 MG/0.8ML ~~LOC~~ SOLN
70.0000 mg | SUBCUTANEOUS | Status: DC
  Administered 2016-04-29 – 2016-04-30 (×2): 70 mg via SUBCUTANEOUS
  Filled 2016-04-29 (×2): qty 0.8

## 2016-04-29 MED ORDER — LACTATED RINGERS IV SOLN: INTRAVENOUS | Status: DC

## 2016-04-29 MED ORDER — MORPHINE SULFATE (PF) 2 MG/ML IV SOLN
2.0000 mg | INTRAVENOUS | Status: DC | PRN
  Administered 2016-04-29: 2 mg via INTRAVENOUS
  Filled 2016-04-29: qty 1

## 2016-04-29 MED ORDER — ACETAMINOPHEN 325 MG PO TABS
650.0000 mg | ORAL_TABLET | ORAL | Status: DC | PRN
  Administered 2016-04-29: 650 mg via ORAL
  Filled 2016-04-29: qty 2

## 2016-04-29 MED ORDER — TRAZODONE HCL 100 MG PO TABS
100.0000 mg | ORAL_TABLET | Freq: Every day | ORAL | Status: DC
  Administered 2016-04-29 – 2016-05-05 (×6): 100 mg via ORAL
  Filled 2016-04-29 (×7): qty 1

## 2016-04-29 MED ORDER — KETOROLAC TROMETHAMINE 15 MG/ML IJ SOLN: 15.0000 mg | Freq: Once | INTRAMUSCULAR | Status: AC | PRN

## 2016-04-29 MED ORDER — CARVEDILOL 6.25 MG PO TABS
9.3750 mg | ORAL_TABLET | Freq: Two times a day (BID) | ORAL | Status: DC
  Administered 2016-04-29 – 2016-05-06 (×14): 9.375 mg via ORAL
  Filled 2016-04-29 (×14): qty 1

## 2016-04-29 MED ORDER — ONDANSETRON HCL 4 MG/2ML IJ SOLN
4.0000 mg | Freq: Four times a day (QID) | INTRAMUSCULAR | Status: DC | PRN
  Administered 2016-05-01 – 2016-05-02 (×3): 4 mg via INTRAVENOUS
  Filled 2016-04-29 (×3): qty 2

## 2016-04-29 NOTE — H&P (Signed)
Date: 04/29/2016               Patient Name:  Suzanne Macdonald MRN: [id number]  DOB: [birth year] Age / Sex: 65 y.o., female   PCP: Shela Leff, MD         Medical Service: Internal Medicine Teaching Service         Attending Physician: Dr. Axel Filler, MD    First Contact: Dr. Danford Bad Pager: [phone]  Second Contact: Dr. Hulen Luster Pager: [phone]       After Hours (After 5p/  First Contact Pager: [phone]  weekends / holidays): Second Contact Pager: [phone]   Chief Complaint: abdominal pain  History of Present Illness: Suzanne Macdonald is a 65yo female with PMH of HTN, OSA, asthma, A fib on Eliquis, h/o colonic carcinoma s/p resection, h/o gastric bypass with multiple hernia repairs presenting to the ED with a 3 day history of abdominal pain and back pain. The pain began suddenly and was not associated with eating; it is intermittent, sharp, and without radiation. Patient states the pain she has been feeling is the same as previous hernia-associated pains. She states she has had decreased food intake, but maintained fluid intake; she endorses nausea and one episode of nonbloody, nonbilious vomiting that she states was brought on by the pain. She states she has had regular bowel movements without diarrhea, hematochezia or melena, but has not had a BM today. She does endorse some shortness of breath that she attributes to her asthma that tends to flare with seasonal changes; she has not had to increase her use of her inhalers. She denies HA, CP, urinary symptoms, edema, fevers, chills, or weakness.  She was in the process of setting up outpatient surgery for her known hernia and getting cardiac clearance which she just received. Her Echo in 03/04/2016 showed EF 55-60% with no abnl wall motion; stress test on 04/13/2016 was wnl. Her a fib is rate controled with carvedilol  In the ED, she received 516m NS bolus, zofran and morphine 229monce. CT abd/pel wo contrast showed LUQ ventral abdominal  wall hernia containing loop of transverse colon with wall thickening and fat stranding of the bowel; there is also concern for several foci of pneumatosis. She was afebrile with stable vital signs, in NSR; WBC 8.9, Hgb 12.8; plt 221; Na 137, K 3.7, Cl 109, CO2 22, BUN 11, Cr 0.78, Glu 105; Ca 8.5, Alb 3.5; Ast 43, Alt 46, alk phos 67. UA was obtained showing +nitrites, squamous cells and many bacteria but patient is asymptomatic.  Surgery saw the patient in the ED and decided since the patient is stable, urgent surgery is not needed. They will follow and if patient remains stable, afebrile, with controlled pain, and able to tolerate PO intake, then outpatient surgery will be pursued.  Meds:  Current Meds  Medication Sig  . acetaminophen (TYLENOL) 500 MG tablet Take 1,000 mg by mouth every 6 (six) hours as needed for mild pain or moderate pain.  . Marland Kitchenlbuterol (PROVENTIL HFA;VENTOLIN HFA) 108 (90 Base) MCG/ACT inhaler Inhale 1-2 puffs into the lungs every 6 (six) hours as needed for wheezing or shortness of breath.  . Marland Kitchenpixaban (ELIQUIS) 5 MG TABS tablet Take 1 tablet (5 mg total) by mouth 2 (two) times daily.  . Marland Kitchentorvastatin (LIPITOR) 10 MG tablet take 1 tablet by mouth once daily  . budesonide-formoterol (SYMBICORT) 160-4.5 MCG/ACT inhaler Inhale 2 puffs into the lungs 2 (two) times daily.  . Calcium-Phosphorus-Vitamin D (CALCIUM/VITAMIN  D3/ADULT GUMMY PO) Take 2 each by mouth daily.  . carvedilol (COREG) 6.25 MG tablet Take 1.5 tablets (9.375 mg total) by mouth 2 (two) times daily.  . cholecalciferol (VITAMIN D) 1000 units tablet Take 2,000 Units by mouth daily.   . cyanocobalamin 500 MCG tablet Take 1,000 mcg by mouth daily.   . flecainide (TAMBOCOR) 50 MG tablet Take 1 tablet (50 mg total) by mouth 2 (two) times daily.  . hydrochlorothiazide (HYDRODIURIL) 25 MG tablet Take 0.5 tablets (12.5 mg total) by mouth daily.  . Multiple Vitamins-Minerals (ALIVE WOMENS GUMMY) CHEW Chew 2 each by mouth daily.   Marland Kitchen omeprazole (PRILOSEC) 40 MG capsule Take 1 capsule (40 mg total) by mouth daily.  . traZODone (DESYREL) 100 MG tablet Take 100 mg by mouth at bedtime.     Allergies: Allergies as of 04/28/2016 - Review Complete 04/28/2016  Allergen Reaction Noted  . Iodine Hives 10/17/2015   Past Medical History:  Diagnosis Date  . Asthma   . Atrial fibrillation ([place])   . Cervical cancer ([place]) 1972  . Colon cancer ([place])   . H/O gastric bypass   . Hyperlipidemia   . Hypertension   . Morbid obesity ([place]) 03/04/2016  . Sleep apnea     Family History: Mother - DM, CHF; Father - DM, MI, strokes; brothers with heart problems  Social History: Lives with husband, children and 2 dogs. Studying psychology online. Former smoker, quit 20 years ago. Denies alcohol and illicit drug use  Review of Systems: A complete ROS was negative except as per HPI.   Physical Exam: Blood pressure 145/59, pulse 62, temperature 98 F (36.7 C), temperature source Oral, resp. rate 16, height 5' 2" (1.575 m), weight 300 lb (136.1 kg), SpO2 98 %.  Constitutional: NAD, lying comfortably in bed HEENT: PEERL; oropharynx without dentition, moist mucous membranes CV: distant heart sounds, RRR, no murmurs, rubs or gallops appreciated Chest: port on R anterior chest, without edema, erythema, or warmth Resp: CTAB, no increased work of breathing, no wheezing or crackles appreciated Abd: Obese, soft, +BS, mildly tender left mid abdomen; multiple well healed surgical scars Ext: warm, 2+ pulses throughout, 1+ bil LE edema  EKG: NSR  CXR: N/A  Assessment & Plan by Problem: Active Problems:   Incarcerated hernia  Chronic Incarcerated Ventral Hernia: Patient with history of multiple abdominal surgeries complicated by multiple hernias and hernial repairs, presents with acute onset of abdominal pain and  --monitor for worsening signs and symptoms of bowel strangulation and bowel obstruction; surgery following --if remains  stable, afebrile, w/o leukocytosis, tolerating PO intake, and with pain control will pursue outpatient surgery  --Lactated ringers 172m/hr --pain control with IV morphine 232mq4hr PRN; zofran PRN for nausea  Paroxysmal A fib: Patient with history of A fib on Eliquis, controlled with carvedilol which was recently increased to 9.37570mID and flecainide 39m81mD. At admission, patient is in sinus rhythm.  --continue carvedilol 9.375mg40m, flecainide 39mg 20m--hold Eliquis; begin heparin ggt  HTN: Patient with history of hypertension controlled with HCTZ. Currently normotensive and in the setting of decreased PO intake. --hold HCTZ --monitor bp and restart HCTZ if hypertensive  Asthma: Patient with history of asthma controled with Proventil and Dulera --Continue home meds of Proventil and Dulera  H/o colon carcinoma s/p resection: [place] had diagnosis of colon carcinoma [place], but was unable to tolerate it. She has  not had a complete colonoscopy since the surgery and has not established with GI or surgery here for f/u. A one-year f/u colonoscopy was attempted but unsuccessful due to "blockage". --Patient will need scheduled flushing of port --Patient needs to establish with GI &/or surgery for f/u of colon cancer resection; patient was referred to [facility] but did not show up for appointment  Dispo: Admit patient to Observation with expected length of stay less than 2 midnights.  Signed: Alphonzo Grieve, MD 04/29/2016, 12:07 AM  Pager: [phone]

## 2016-04-29 NOTE — Progress Notes (Addendum)
ANTICOAGULATION CONSULT NOTE - Initial Consult  Pharmacy Consult for heparin Indication: atrial fibrillation  Allergies  Allergen Reactions  . Iodine Hives    Patient Measurements: Height: '5\' 2"'$  (157.5 cm) Weight: 300 lb (136.1 kg) IBW/kg (Calculated) : 50.1 Heparin Dosing Weight: 85kg  Vital Signs: Temp: 98 F (36.7 C) (09/09 2232) Temp Source: Oral (09/09 2232) BP: 145/59 (09/10 0000) Pulse Rate: 62 (09/10 0000)  Labs:  Recent Labs  04/28/16 1439  HGB 12.8  HCT 40.7  PLT 221  CREATININE 0.78    Estimated Creatinine Clearance: 93.5 mL/min (by C-G formula based on SCr of 0.8 mg/dL).   Medical History: Past Medical History:  Diagnosis Date  . Asthma   . Atrial fibrillation ([place])   . Cervical cancer ([place]) 1972  . Colon cancer ([place])   . H/O gastric bypass   . Hyperlipidemia   . Hypertension   . Morbid obesity ([place]) 03/04/2016  . Sleep apnea     Assessment: 65yo female presents w/ need for surgical management of hernia, on Eliquis PTA for Afib (last dose 9/9 at 0900), to begin heparin bridge.  Goal of Therapy:  Heparin level 0.3-0.7 units/ml aPTT 66-102 seconds Monitor platelets by anticoagulation protocol: Yes   Plan:  Will begin heparin gtt at 1400 units/hr and monitor heparin levels, PTT (Eliquis likely affecting anti-Xa levels), and CBC.  [name] Neat, PharmD, BCPS  04/29/2016,12:18 AM   ADDENDUM: This am MD requests to change bridge to LMWH for DVT Px.  Will start Lovenox '70mg'$  SQ Q24H and monitor to resume Eliquis. [name] Neat, PharmD, BCPS 04/29/2016 7:26 AM

## 2016-04-29 NOTE — Progress Notes (Signed)
Subjective: Pt with less abd pain this AM  Objective: Vital signs in last 24 hours: Temp:  [97.8 F (36.6 C)-98.6 F (37 C)] 98.1 F (36.7 C) (09/10 0812) Pulse Rate:  [58-96] 69 (09/10 0812) Resp:  [12-22] 18 (09/10 0812) BP: (113-149)/(48-78) 113/48 (09/10 0812) SpO2:  [92 %-100 %] 92 % (09/10 0812) Weight:  [135 kg (297 lb 11.2 oz)-136.1 kg (300 lb)] 135 kg (297 lb 11.2 oz) (09/10 0100) Last BM Date: 04/27/16  Intake/Output from previous day: 09/09 0701 - 09/10 0700 In: 565.3 [I.V.:565.3] Out: -  Intake/Output this shift: Total I/O In: 250 [I.V.:250] Out: -   General appearance: alert and cooperative GI: multp abd scars, hernia LUQ ttp, no rebound/guarding  Lab Results:   Recent Labs  04/28/16 1439  WBC 8.9  HGB 12.8  HCT 40.7  PLT 221   BMET  Recent Labs  04/28/16 1439  NA 137  K 3.7  CL 109  CO2 22  GLUCOSE 105*  BUN 11  CREATININE 0.78  CALCIUM 8.5*   PT/INR No results for input(s): LABPROT, INR in the last 72 hours. ABG No results for input(s): PHART, HCO3 in the last 72 hours.  Invalid input(s): PCO2, PO2  Studies/Results: Ct Abdomen Pelvis Wo Contrast  Result Date: 04/28/2016 CLINICAL DATA:  Generalized abdominal pain EXAM: CT ABDOMEN AND PELVIS WITHOUT CONTRAST TECHNIQUE: Multidetector CT imaging of the abdomen and pelvis was performed following the standard protocol without IV contrast. COMPARISON:  10/17/2015 FINDINGS: Lower chest: The lung bases are clear. No pleural or pericardial effusion. Hepatobiliary: Hepatic steatosis is identified. The gallbladder appears normal. Pancreas: No inflammation or mass identified. Spleen: The spleen appears normal. Adrenals/Urinary Tract: Normal appearance of the right adrenal gland. Stable appearance of a nodule in the left adrenal gland measuring 1.2 cm. No renal mass or obstructive uropathy. The urinary bladder appears normal. Stomach/Bowel: There are postoperative changes involving the stomach  compatible with previous gastric bypass. There is no pathologic dilatation of the small bowel loops. Enteric contrast material is identified up to the level of the proximal transverse colon. Within the left upper quadrant of the abdomen there is a ventral wall hernia which contains a loop of large bowel. New inflammatory changes are noted surrounding this loop of bowel as well as the proximal afferent segment of colon, image 33 of series 2. The afferent loop exhibits abnormal wall thickening with surrounding inflammatory changes as well as several foci of gas within the bowel wall. Vascular/Lymphatic: Calcified atherosclerotic disease involves the abdominal aorta. No aneurysm. No enlarged retroperitoneal or mesenteric adenopathy. No enlarged pelvic or inguinal lymph nodes. Reproductive: Status post hysterectomy. No adnexal masses. Other: No free fluid or abnormal fluid collections. Musculoskeletal: No aggressive lytic or sclerotic bone lesions. IMPRESSION: 1. Again noted is a left upper quadrant ventral abdominal wall hernia which contains a loop of colon. There are inflammatory changes including wall thickening and fat stranding involving the herniated loop of bowel as well as the more proximal afferent segment of bowel. Additionally, there is a suggestion of several foci of pneumatosis involving the afferent bowel loop. Cannot rule out incarcerated hernia. 2. Aortic atherosclerosis Electronically Signed   By: Kerby Moors M.D.   On: 04/28/2016 19:49    Anti-infectives: Anti-infectives    None      Assessment/Plan: 65 y/o F with morbid obesity, multp abd surgeries and hernia surgeries. Pt on elliqus /heparin gtt for a fib. -OK for clears today -pt with several hernias and 1 that appears  symptomatic.  She would require an extensive operation due to her PSHx and intricate hernia surgery.  If pt progresses, it would ideal to have the patient seen at a high volume surgery site, like Dr. Macy Mis at  [place], [place]. -will follow along for now   LOS: 1 day    Rosario Jacks., [facility] 04/29/2016

## 2016-04-29 NOTE — Progress Notes (Signed)
   Subjective:  [name] was seen and evaluated today at bedside. She reports her abdominal pain has dissipated throughout the night. No complaints overnight.   Objective:  Vital signs in last 24 hours: Vitals:   04/29/16 0000 04/29/16 0100 04/29/16 0423 04/29/16 0812  BP: 145/59 (!) 146/62 (!) 128/49 (!) 113/48  Pulse: 62 79 96 69  Resp: '16  16 18  '$ Temp:    98.1 F (36.7 C)  TempSrc:    Oral  SpO2: 98% 98% 96% 92%  Weight:  297 lb 11.2 oz (135 kg)    Height:       Scheduled Medications . carvedilol  9.375 mg Oral BID  . cholecalciferol  2,000 Units Oral Daily  . enoxaparin (LOVENOX) injection  70 mg Subcutaneous Q24H  . flecainide  50 mg Oral BID  . mometasone-formoterol  2 puff Inhalation BID  . pantoprazole  40 mg Oral Daily  . cyanocobalamin  1,000 mcg Oral Daily   Physical Exam  Constitutional: No distress.  Morbidly obese caucasian female resting comfortably in bed.  HENT:  Head: Normocephalic and atraumatic.  Cardiovascular: Normal rate and regular rhythm.   No murmur heard. Pulmonary/Chest: Effort normal and breath sounds normal. No respiratory distress.  Abdominal: Soft. Bowel sounds are normal. She exhibits distension. There is no tenderness. There is no rebound and no guarding.  Non-tender. Prominent ventral hernia noted without pain.  Skin: Skin is warm and dry. She is not diaphoretic.    Assessment/Plan:  Active Problems:   Incarcerated hernia  Chronic Incarcerated Ventral Hernia Patient without any abdominal pain since admission. Abdomen non-tender to palpation however there is a prominent ventral hernia. Patient seen by surgery today who suggested a clear liquid diet and noted she would likely require an extensive operation due to her PSHx and intricate hernia surgery. If her hernia progresses, it would be ideal to have her seen at high volume surgery site. They will follow along with the patient.  If patient does not have any more abdominal  pain, will likely d/c home tomorrow with f/u outpatient however we continue to appreciate surgeries advice in this matter.  -Will follow up with continued recommendations from the surgery team  Atrial Fibrillation Rate well controlled and pressures have remained stable. On Ellqiuis with last dose Saturday. Holding Eliquis for now as there may be possibility of surgery soon. Daily risk of stroke from afib very low.  -Continuing rate control with Flecainide 50 mg BID  HTN Appears controlled during admission. Holding home HCTZ  Dispo: Anticipated discharge in approximately 1-2 day(s).   Suzanne Hubble, DO 04/29/2016, 11:00 AM Pager: [phone]

## 2016-04-29 NOTE — ED Notes (Signed)
Attempted report 

## 2016-04-30 DIAGNOSIS — Z7901 Long term (current) use of anticoagulants: Secondary | ICD-10-CM

## 2016-04-30 DIAGNOSIS — K436 Other and unspecified ventral hernia with obstruction, without gangrene: Secondary | ICD-10-CM

## 2016-04-30 DIAGNOSIS — I4891 Unspecified atrial fibrillation: Secondary | ICD-10-CM

## 2016-04-30 LAB — BASIC METABOLIC PANEL
Anion gap: 6 (ref 5–15)
BUN: 5 mg/dL — AB (ref 6–20)
CHLORIDE: 108 mmol/L (ref 101–111)
CO2: 28 mmol/L (ref 22–32)
Calcium: 8.8 mg/dL — ABNORMAL LOW (ref 8.9–10.3)
Creatinine, Ser: 0.82 mg/dL (ref 0.44–1.00)
Glucose, Bld: 97 mg/dL (ref 65–99)
POTASSIUM: 4.2 mmol/L (ref 3.5–5.1)
SODIUM: 142 mmol/L (ref 135–145)

## 2016-04-30 MED ORDER — SODIUM CHLORIDE 0.9% FLUSH: 10.0000 mL | Freq: Two times a day (BID) | INTRAVENOUS | Status: DC

## 2016-04-30 MED ORDER — SODIUM CHLORIDE 0.9% FLUSH
10.0000 mL | INTRAVENOUS | Status: DC | PRN
  Administered 2016-05-04 – 2016-05-06 (×3): 10 mL
  Filled 2016-04-30 (×3): qty 40

## 2016-04-30 MED ORDER — DOCUSATE SODIUM 100 MG PO CAPS
100.0000 mg | ORAL_CAPSULE | Freq: Every day | ORAL | Status: DC
Start: 2016-04-30 — End: 2016-05-02
  Administered 2016-04-30: 100 mg via ORAL
  Filled 2016-04-30: qty 1

## 2016-04-30 MED ORDER — POLYETHYLENE GLYCOL 3350 17 G PO PACK
17.0000 g | PACK | Freq: Every day | ORAL | Status: DC
  Administered 2016-04-30 – 2016-05-06 (×5): 17 g via ORAL
  Filled 2016-04-30 (×6): qty 1

## 2016-04-30 MED ORDER — DEXTROSE 5 % IV SOLN
2.0000 g | INTRAVENOUS | Status: AC
  Administered 2016-05-01: 2 g via INTRAVENOUS
  Filled 2016-04-30: qty 2

## 2016-04-30 NOTE — Progress Notes (Signed)
Internal Medicine Attending:   I saw and examined the patient. I reviewed the resident's note and I agree with the resident's findings and plan as documented in the resident's note. Ms Wideman is currently tolerating her PO diet, she still has some mild abdominal pain and tenderness.  Gen Surgery is currently discussing hernia repair here versus outpatient referral.

## 2016-04-30 NOTE — Progress Notes (Signed)
Patient ID: Suzanne Macdonald, female   DOB: 10-02-1950, 65 y.o.   MRN: [id number]  [facility] Surgery Progress Note     Subjective: States that her abdominal pain is significantly improved from yesterday.  Abdominal surgical history includes 4 previous hernia repairs (most recent >13 years ago), gastric bypass 1992, gastric lap banding 2013 (removed 2014), colon resection for cancer 2015 (had some chemo but did not complete).  Last BM was last Thursday -- typically has multiple bowel movements daily.  Objective: Vital signs in last 24 hours: Temp:  [97.7 F (36.5 C)-98.1 F (36.7 C)] 97.7 F (36.5 C) (09/11 1004) Pulse Rate:  [60-72] 66 (09/11 1004) Resp:  [17-20] 17 (09/11 1004) BP: (108-141)/(41-63) 141/63 (09/11 1004) SpO2:  [96 %-100 %] 99 % (09/11 1004) Weight:  [294 lb 1.5 oz (133.4 kg)] 294 lb 1.5 oz (133.4 kg) (09/10 2139) Last BM Date: 04/27/16  Intake/Output from previous day: 09/10 0701 - 09/11 0700 In: 850 [P.O.:600; I.V.:250] Out: 0  Intake/Output this shift: Total I/O In: 360 [P.O.:360] Out: 0   PE: Gen:  Alert, NAD, pleasant Pulm:  CTAB Abd: obese, soft, multiple abdominal scars, +BS, tender non-reducable LUQ hernia  Lab Results:   Recent Labs  04/28/16 1439 04/29/16 1200  WBC 8.9 6.0  HGB 12.8 11.4*  HCT 40.7 37.6  PLT 221 212   BMET  Recent Labs  04/29/16 1200 04/30/16 0840  NA 138 142  K 4.6 4.2  CL 106 108  CO2 27 28  GLUCOSE 144* 97  BUN 7 5*  CREATININE 0.85 0.82  CALCIUM 8.4* 8.8*   PT/INR No results for input(s): LABPROT, INR in the last 72 hours. CMP     Component Value Date/Time   NA 142 04/30/2016 0840   NA 143 01/24/2016 0952   K 4.2 04/30/2016 0840   CL 108 04/30/2016 0840   CO2 28 04/30/2016 0840   GLUCOSE 97 04/30/2016 0840   BUN 5 (L) 04/30/2016 0840   BUN 19 01/24/2016 0952   CREATININE 0.82 04/30/2016 0840   CALCIUM 8.8 (L) 04/30/2016 0840   PROT 5.9 (L) 04/28/2016 1439   PROT 6.3 01/24/2016 0952    ALBUMIN 3.5 04/28/2016 1439   ALBUMIN 3.9 01/24/2016 0952   AST 43 (H) 04/28/2016 1439   ALT 46 04/28/2016 1439   ALKPHOS 67 04/28/2016 1439   BILITOT 0.5 04/28/2016 1439   BILITOT 0.3 01/24/2016 0952   GFRNONAA >60 04/30/2016 0840   GFRAA >60 04/30/2016 0840   Lipase     Component Value Date/Time   LIPASE 19 04/28/2016 1439       Studies/Results: Ct Abdomen Pelvis Wo Contrast  Result Date: 04/28/2016 CLINICAL DATA:  Generalized abdominal pain EXAM: CT ABDOMEN AND PELVIS WITHOUT CONTRAST TECHNIQUE: Multidetector CT imaging of the abdomen and pelvis was performed following the standard protocol without IV contrast. COMPARISON:  10/17/2015 FINDINGS: Lower chest: The lung bases are clear. No pleural or pericardial effusion. Hepatobiliary: Hepatic steatosis is identified. The gallbladder appears normal. Pancreas: No inflammation or mass identified. Spleen: The spleen appears normal. Adrenals/Urinary Tract: Normal appearance of the right adrenal gland. Stable appearance of a nodule in the left adrenal gland measuring 1.2 cm. No renal mass or obstructive uropathy. The urinary bladder appears normal. Stomach/Bowel: There are postoperative changes involving the stomach compatible with previous gastric bypass. There is no pathologic dilatation of the small bowel loops. Enteric contrast material is identified up to the level of the proximal transverse colon. Within the left upper  quadrant of the abdomen there is a ventral wall hernia which contains a loop of large bowel. New inflammatory changes are noted surrounding this loop of bowel as well as the proximal afferent segment of colon, image 33 of series 2. The afferent loop exhibits abnormal wall thickening with surrounding inflammatory changes as well as several foci of gas within the bowel wall. Vascular/Lymphatic: Calcified atherosclerotic disease involves the abdominal aorta. No aneurysm. No enlarged retroperitoneal or mesenteric adenopathy. No  enlarged pelvic or inguinal lymph nodes. Reproductive: Status post hysterectomy. No adnexal masses. Other: No free fluid or abnormal fluid collections. Musculoskeletal: No aggressive lytic or sclerotic bone lesions. IMPRESSION: 1. Again noted is a left upper quadrant ventral abdominal wall hernia which contains a loop of colon. There are inflammatory changes including wall thickening and fat stranding involving the herniated loop of bowel as well as the more proximal afferent segment of bowel. Additionally, there is a suggestion of several foci of pneumatosis involving the afferent bowel loop. Cannot rule out incarcerated hernia. 2. Aortic atherosclerosis Electronically Signed   By: Kerby Moors M.D.   On: 04/28/2016 19:49    Anti-infectives: Anti-infectives    None       Assessment/Plan Morbid obesity H/o multiple abd surgeries including 4 hernia repairs Atrial fibrillation - on eliquis at home Constipation - last BM 04/26/16 -start miralax  FEN - clear liquids VTE - lovenox  Plan - abdominal pain improving. MD to discuss options later today: surgically repairing hernia here vs discharge and follow-up at a high volume surgery site like Dr. Macy Mis at [place], [place]    LOS: 2 days    Jerrye Beavers , [facility] Surgery 04/30/2016, 10:37 AM Pager: [phone] Consults: [phone] Mon-Fri 7:00 am-4:30 pm Sat-Sun 7:00 am-11:30 am

## 2016-04-30 NOTE — Progress Notes (Signed)
   Subjective:  Ms. Banka was seen and evaluated today at bedside. She has no new complaints. Complains of some abdominal pain with palpation. Denies any acute events overnight. Pt admits to 4 hernia repairs in the past, last one 13+ years ago.   Objective:  Vital signs in last 24 hours: Vitals:   04/29/16 2005 04/29/16 2139 04/30/16 0518 04/30/16 1004  BP:  (!) 108/41 (!) 113/49 (!) 141/63  Pulse:  60 67 66  Resp:  '18 18 17  '$ Temp:  98.1 F (36.7 C) 97.8 F (36.6 C) 97.7 F (36.5 C)  TempSrc:  Oral Oral Oral  SpO2: 98% 100% 96% 99%  Weight:  294 lb 1.5 oz (133.4 kg)    Height:       Scheduled Medications . carvedilol  9.375 mg Oral BID  . [START ON 05/01/2016] cefOXitin  2 g Intravenous To OR  . cholecalciferol  2,000 Units Oral Daily  . enoxaparin (LOVENOX) injection  70 mg Subcutaneous Q24H  . flecainide  50 mg Oral BID  . mometasone-formoterol  2 puff Inhalation BID  . pantoprazole  40 mg Oral Daily  . polyethylene glycol  17 g Oral Daily  . traZODone  100 mg Oral QHS  . cyanocobalamin  1,000 mcg Oral Daily   Physical Exam  Constitutional: She is oriented to person, place, and time. No distress.  Morbidly obese female resting comfortably in chair. In no acute distress.   HENT:  Head: Normocephalic and atraumatic.  Cardiovascular: Exam reveals no friction rub.   No murmur heard. Irregularly irregular.   Pulmonary/Chest: Effort normal and breath sounds normal. No respiratory distress. She has no wheezes.  Abdominal: Soft. Bowel sounds are normal. She exhibits distension. There is tenderness. There is no rebound and no guarding.  Tenderness over ventral hernia. Palpable colon.  Neurological: She is alert and oriented to person, place, and time.  Skin: Skin is warm and dry. She is not diaphoretic.    Assessment/Plan:  Active Problems:   Incarcerated hernia  Incarcerated hernia Patient here with long hx of ventral wall hernia. Per patient she has had 4 hernia  repairs in the past. Imaging shows a very large ventral hernia containing loop of colon. Ventral hernia site tender to palpation. Patient seen by surgery who suggested a clear liquid diet and noted she would probably require extensive abdominal surgery. Surgery to discuss with patient her options in terms of repair. Will follow their recommendations.  -Continue current treatment regimen. -Pain control with IV Morphine 2g Q4H PRN pain -Zofran PRN nausea -Awaiting surgery recs and dispo advice.  Atrial fibrillation Rate controlled. Was on elliquis however has been held due to possibility of surgery.  -Continuing rate control with Flecainide 50 mg BID   Dispo: Anticipated discharge in approximately 1-2 day(s).   Alanny Rivers, DO 04/30/2016, 3:03 PM Pager: [phone]

## 2016-05-01 ENCOUNTER — Encounter (HOSPITAL_COMMUNITY)
Admission: EM | Disposition: A | Discharge status: Home / Self Care | Payer: Self-pay | Source: Home / Self Care | Attending: Internal Medicine

## 2016-05-01 ENCOUNTER — Inpatient Hospital Stay (HOSPITAL_COMMUNITY): Payer: Medicare Other | Admitting: Anesthesiology

## 2016-05-01 ENCOUNTER — Encounter (HOSPITAL_COMMUNITY): Payer: Self-pay | Admitting: Anesthesiology

## 2016-05-01 HISTORY — PX: INSERTION OF MESH: SHX5868

## 2016-05-01 HISTORY — PX: VENTRAL HERNIA REPAIR: SHX424

## 2016-05-01 LAB — GLUCOSE, CAPILLARY: GLUCOSE-CAPILLARY: 119 mg/dL — AB (ref 65–99)

## 2016-05-01 LAB — SURGICAL PCR SCREEN
MRSA, PCR: NEGATIVE
Staphylococcus aureus: NEGATIVE

## 2016-05-01 SURGERY — REPAIR, HERNIA, VENTRAL
Anesthesia: General | Site: Abdomen

## 2016-05-01 MED ORDER — OXYCODONE HCL 5 MG PO TABS: 5.0000 mg | ORAL_TABLET | Freq: Once | ORAL | Status: DC | PRN

## 2016-05-01 MED ORDER — HYDROMORPHONE HCL 1 MG/ML IJ SOLN
INTRAMUSCULAR | Status: DC | PRN
  Administered 2016-05-01 (×2): 0.5 mg via INTRAVENOUS

## 2016-05-01 MED ORDER — DEXAMETHASONE SODIUM PHOSPHATE 10 MG/ML IJ SOLN
INTRAMUSCULAR | Status: AC
  Filled 2016-05-01: qty 1

## 2016-05-01 MED ORDER — DEXAMETHASONE SODIUM PHOSPHATE 10 MG/ML IJ SOLN
INTRAMUSCULAR | Status: DC | PRN
  Administered 2016-05-01: 10 mg via INTRAVENOUS

## 2016-05-01 MED ORDER — FENTANYL CITRATE (PF) 100 MCG/2ML IJ SOLN
INTRAMUSCULAR | Status: DC | PRN
  Administered 2016-05-01 (×5): 50 ug via INTRAVENOUS
  Administered 2016-05-01 (×2): 100 ug via INTRAVENOUS
  Administered 2016-05-01: 50 ug via INTRAVENOUS

## 2016-05-01 MED ORDER — LACTATED RINGERS IV SOLN
INTRAVENOUS | Status: DC
  Administered 2016-05-01: 12:00:00 via INTRAVENOUS

## 2016-05-01 MED ORDER — FENTANYL CITRATE (PF) 100 MCG/2ML IJ SOLN
INTRAMUSCULAR | Status: AC
  Filled 2016-05-01: qty 2

## 2016-05-01 MED ORDER — PROPOFOL 10 MG/ML IV BOLUS
INTRAVENOUS | Status: AC
  Filled 2016-05-01: qty 20

## 2016-05-01 MED ORDER — POTASSIUM CHLORIDE IN NACL 20-0.9 MEQ/L-% IV SOLN
INTRAVENOUS | Status: DC
  Administered 2016-05-01 – 2016-05-02 (×2): via INTRAVENOUS
  Filled 2016-05-01 (×3): qty 1000

## 2016-05-01 MED ORDER — DOCUSATE SODIUM 100 MG PO CAPS: 100.0000 mg | ORAL_CAPSULE | Freq: Every day | ORAL | Status: DC | PRN

## 2016-05-01 MED ORDER — PROPOFOL 10 MG/ML IV BOLUS
INTRAVENOUS | Status: DC | PRN
  Administered 2016-05-01: 50 mg via INTRAVENOUS
  Administered 2016-05-01: 130 mg via INTRAVENOUS
  Administered 2016-05-01 (×2): 50 mg via INTRAVENOUS
  Administered 2016-05-01: 20 mg via INTRAVENOUS

## 2016-05-01 MED ORDER — LACTATED RINGERS IV SOLN
INTRAVENOUS | Status: DC | PRN
  Administered 2016-05-01 (×3): via INTRAVENOUS

## 2016-05-01 MED ORDER — SUGAMMADEX SODIUM 200 MG/2ML IV SOLN
INTRAVENOUS | Status: AC
  Filled 2016-05-01: qty 2

## 2016-05-01 MED ORDER — PROMETHAZINE HCL 25 MG/ML IJ SOLN
6.2500 mg | INTRAMUSCULAR | Status: AC | PRN
  Administered 2016-05-01 (×2): 6.25 mg via INTRAVENOUS

## 2016-05-01 MED ORDER — HYDROMORPHONE HCL 1 MG/ML IJ SOLN
INTRAMUSCULAR | Status: AC
  Filled 2016-05-01: qty 1

## 2016-05-01 MED ORDER — MIDAZOLAM HCL 2 MG/2ML IJ SOLN
INTRAMUSCULAR | Status: AC
  Filled 2016-05-01: qty 2

## 2016-05-01 MED ORDER — ARTIFICIAL TEARS OP OINT
TOPICAL_OINTMENT | OPHTHALMIC | Status: DC | PRN
  Administered 2016-05-01: 1 via OPHTHALMIC

## 2016-05-01 MED ORDER — LIDOCAINE HCL (CARDIAC) 20 MG/ML IV SOLN
INTRAVENOUS | Status: DC | PRN
  Administered 2016-05-01: 60 mg via INTRAVENOUS

## 2016-05-01 MED ORDER — 0.9 % SODIUM CHLORIDE (POUR BTL) OPTIME
TOPICAL | Status: DC | PRN
  Administered 2016-05-01 (×2): 1000 mL

## 2016-05-01 MED ORDER — HYDROMORPHONE HCL 1 MG/ML IJ SOLN
0.2500 mg | INTRAMUSCULAR | Status: DC | PRN
  Administered 2016-05-01: 0.5 mg via INTRAVENOUS

## 2016-05-01 MED ORDER — SODIUM CHLORIDE 0.9 % IR SOLN
Status: DC | PRN
  Administered 2016-05-01: 500 mL

## 2016-05-01 MED ORDER — MIDAZOLAM HCL 5 MG/5ML IJ SOLN
INTRAMUSCULAR | Status: DC | PRN
  Administered 2016-05-01: 2 mg via INTRAVENOUS

## 2016-05-01 MED ORDER — BUPIVACAINE-EPINEPHRINE 0.5% -1:200000 IJ SOLN
INTRAMUSCULAR | Status: DC | PRN
  Administered 2016-05-01: 30 mL

## 2016-05-01 MED ORDER — ACETAMINOPHEN 325 MG PO TABS: 325.0000 mg | ORAL_TABLET | ORAL | Status: DC | PRN

## 2016-05-01 MED ORDER — ACETAMINOPHEN 160 MG/5ML PO SOLN
325.0000 mg | ORAL | Status: DC | PRN
  Filled 2016-05-01: qty 20.3

## 2016-05-01 MED ORDER — ALBUMIN HUMAN 5 % IV SOLN
INTRAVENOUS | Status: DC | PRN
  Administered 2016-05-01: 15:00:00 via INTRAVENOUS

## 2016-05-01 MED ORDER — SUGAMMADEX SODIUM 200 MG/2ML IV SOLN
INTRAVENOUS | Status: DC | PRN
  Administered 2016-05-01: 200 mg via INTRAVENOUS

## 2016-05-01 MED ORDER — PROMETHAZINE HCL 25 MG/ML IJ SOLN
INTRAMUSCULAR | Status: AC
  Administered 2016-05-01: 6.25 mg via INTRAVENOUS
  Filled 2016-05-01: qty 1

## 2016-05-01 MED ORDER — ROCURONIUM BROMIDE 100 MG/10ML IV SOLN
INTRAVENOUS | Status: DC | PRN
  Administered 2016-05-01: 70 mg via INTRAVENOUS
  Administered 2016-05-01 (×2): 10 mg via INTRAVENOUS
  Administered 2016-05-01: 20 mg via INTRAVENOUS

## 2016-05-01 MED ORDER — MORPHINE SULFATE (PF) 2 MG/ML IV SOLN
2.0000 mg | INTRAVENOUS | Status: DC | PRN
Start: 2016-05-01 — End: 2016-05-03
  Administered 2016-05-01 (×4): 2 mg via INTRAVENOUS
  Administered 2016-05-02 (×3): 4 mg via INTRAVENOUS
  Administered 2016-05-02: 2 mg via INTRAVENOUS
  Administered 2016-05-02 (×3): 4 mg via INTRAVENOUS
  Administered 2016-05-02: 2 mg via INTRAVENOUS
  Administered 2016-05-02 – 2016-05-03 (×4): 4 mg via INTRAVENOUS
  Filled 2016-05-01 (×4): qty 1
  Filled 2016-05-01 (×4): qty 2
  Filled 2016-05-01: qty 1
  Filled 2016-05-01 (×5): qty 2
  Filled 2016-05-01 (×2): qty 1
  Filled 2016-05-01: qty 2

## 2016-05-01 MED ORDER — OXYCODONE HCL 5 MG/5ML PO SOLN: 5.0000 mg | Freq: Once | ORAL | Status: DC | PRN

## 2016-05-01 MED ORDER — ONDANSETRON HCL 4 MG/2ML IJ SOLN: 4.0000 mg | Freq: Four times a day (QID) | INTRAMUSCULAR | Status: DC | PRN

## 2016-05-01 MED ORDER — ENOXAPARIN SODIUM 80 MG/0.8ML ~~LOC~~ SOLN
70.0000 mg | SUBCUTANEOUS | Status: DC
  Administered 2016-05-01 – 2016-05-02 (×2): 70 mg via SUBCUTANEOUS
  Filled 2016-05-01 (×2): qty 0.8

## 2016-05-01 MED ORDER — ONDANSETRON HCL 4 MG/2ML IJ SOLN
INTRAMUSCULAR | Status: DC | PRN
  Administered 2016-05-01: 4 mg via INTRAVENOUS

## 2016-05-01 MED ORDER — BUPIVACAINE-EPINEPHRINE (PF) 0.5% -1:200000 IJ SOLN
INTRAMUSCULAR | Status: AC
  Filled 2016-05-01: qty 30

## 2016-05-01 SURGICAL SUPPLY — 73 items
APPLIER CLIP LOGIC TI 5 (MISCELLANEOUS) IMPLANT
BAG DECANTER FOR FLEXI CONT (MISCELLANEOUS) ×3 IMPLANT
BINDER ABDOMINAL 12 ML 46-62 (SOFTGOODS) IMPLANT
BLADE SURG 10 STRL SS (BLADE) ×3 IMPLANT
BLADE SURG ROTATE 9660 (MISCELLANEOUS) IMPLANT
CANISTER SUCTION 2500CC (MISCELLANEOUS) IMPLANT
CHLORAPREP W/TINT 26ML (MISCELLANEOUS) ×3 IMPLANT
COVER SURGICAL LIGHT HANDLE (MISCELLANEOUS) ×6 IMPLANT
DEVICE SECURE STRAP 25 ABSORB (INSTRUMENTS) IMPLANT
DEVICE TROCAR PUNCTURE CLOSURE (ENDOMECHANICALS) ×3 IMPLANT
DRAIN CHANNEL 19F RND (DRAIN) ×3 IMPLANT
DRAPE INCISE IOBAN 66X45 STRL (DRAPES) ×3 IMPLANT
DRAPE LAPAROSCOPIC ABDOMINAL (DRAPES) ×3 IMPLANT
DRSG OPSITE POSTOP 4X10 (GAUZE/BANDAGES/DRESSINGS) ×3 IMPLANT
DRSG OPSITE POSTOP 4X6 (GAUZE/BANDAGES/DRESSINGS) ×3 IMPLANT
ELECT BLADE 6.5 EXT (BLADE) ×3 IMPLANT
ELECT CAUTERY BLADE 6.4 (BLADE) ×6 IMPLANT
ELECT REM PT RETURN 9FT ADLT (ELECTROSURGICAL) ×3
ELECTRODE REM PT RTRN 9FT ADLT (ELECTROSURGICAL) ×2 IMPLANT
EVACUATOR SILICONE 100CC (DRAIN) ×3 IMPLANT
GAUZE SPONGE 4X4 12PLY STRL (GAUZE/BANDAGES/DRESSINGS) ×3 IMPLANT
GLOVE BIO SURGEON STRL SZ 6.5 (GLOVE) ×3 IMPLANT
GLOVE BIO SURGEON STRL SZ7 (GLOVE) ×15 IMPLANT
GLOVE BIO SURGEON STRL SZ7.5 (GLOVE) ×3 IMPLANT
GLOVE BIOGEL PI IND STRL 7.0 (GLOVE) ×2 IMPLANT
GLOVE BIOGEL PI IND STRL 7.5 (GLOVE) ×6 IMPLANT
GLOVE BIOGEL PI IND STRL 8 (GLOVE) ×6 IMPLANT
GLOVE BIOGEL PI INDICATOR 7.0 (GLOVE) ×1
GLOVE BIOGEL PI INDICATOR 7.5 (GLOVE) ×3
GLOVE BIOGEL PI INDICATOR 8 (GLOVE) ×3
GLOVE ECLIPSE 7.5 STRL STRAW (GLOVE) ×18 IMPLANT
GOWN STRL REUS W/ TWL LRG LVL3 (GOWN DISPOSABLE) ×8 IMPLANT
GOWN STRL REUS W/ TWL XL LVL3 (GOWN DISPOSABLE) ×2 IMPLANT
GOWN STRL REUS W/TWL LRG LVL3 (GOWN DISPOSABLE) ×4
GOWN STRL REUS W/TWL XL LVL3 (GOWN DISPOSABLE) ×1
GRASPER SUT TROCAR 14GX15 (MISCELLANEOUS) ×3 IMPLANT
KIT BASIN OR (CUSTOM PROCEDURE TRAY) ×3 IMPLANT
KIT ROOM TURNOVER OR (KITS) ×3 IMPLANT
LIQUID BAND (GAUZE/BANDAGES/DRESSINGS) ×3 IMPLANT
MARKER SKIN DUAL TIP RULER LAB (MISCELLANEOUS) ×3 IMPLANT
MESH PHASIX ST 25X30 (Mesh General) ×3 IMPLANT
NEEDLE SPNL 22GX3.5 QUINCKE BK (NEEDLE) IMPLANT
NS IRRIG 1000ML POUR BTL (IV SOLUTION) ×3 IMPLANT
PAD ARMBOARD 7.5X6 YLW CONV (MISCELLANEOUS) ×6 IMPLANT
PENCIL BUTTON HOLSTER BLD 10FT (ELECTRODE) ×6 IMPLANT
SCALPEL HARMONIC ACE (MISCELLANEOUS) IMPLANT
SCISSORS LAP 5X35 DISP (ENDOMECHANICALS) ×3 IMPLANT
SET IRRIG TUBING LAPAROSCOPIC (IRRIGATION / IRRIGATOR) ×3 IMPLANT
SLEEVE ENDOPATH XCEL 5M (ENDOMECHANICALS) ×6 IMPLANT
STAPLER VISISTAT 35W (STAPLE) ×3 IMPLANT
SUT ETHILON 3 0 FSL (SUTURE) ×3 IMPLANT
SUT MON AB 5-0 PS2 18 (SUTURE) IMPLANT
SUT NOVA 1 T20/GS 25DT (SUTURE) ×3 IMPLANT
SUT NOVA NAB GS-21 0 18 T12 DT (SUTURE) ×6 IMPLANT
SUT PDS AB 0 CT 36 (SUTURE) ×9 IMPLANT
SUT PDS AB 1 TP1 96 (SUTURE) ×6 IMPLANT
SUT SILK 2 0 SH CR/8 (SUTURE) ×3 IMPLANT
SUT SILK 2 0 TIES 10X30 (SUTURE) ×3 IMPLANT
SUT SILK 3 0 TIES 10X30 (SUTURE) ×3 IMPLANT
SUT VIC AB 2-0 CT1 27 (SUTURE) ×1
SUT VIC AB 2-0 CT1 TAPERPNT 27 (SUTURE) ×2 IMPLANT
SUT VIC AB 3-0 SH 18 (SUTURE) ×3 IMPLANT
SYR BULB IRRIGATION 50ML (SYRINGE) ×6 IMPLANT
TAPE CLOTH SURG 4X10 WHT LF (GAUZE/BANDAGES/DRESSINGS) ×3 IMPLANT
TOWEL OR 17X24 6PK STRL BLUE (TOWEL DISPOSABLE) ×3 IMPLANT
TOWEL OR 17X26 10 PK STRL BLUE (TOWEL DISPOSABLE) ×3 IMPLANT
TRAY FOLEY CATH 16FR SILVER (SET/KITS/TRAYS/PACK) IMPLANT
TRAY LAPAROSCOPIC MC (CUSTOM PROCEDURE TRAY) ×3 IMPLANT
TROCAR XCEL NON-BLD 11X100MML (ENDOMECHANICALS) ×3 IMPLANT
TROCAR XCEL NON-BLD 5MMX100MML (ENDOMECHANICALS) ×3 IMPLANT
TUBE CONNECTING 12X1/4 (SUCTIONS) ×6 IMPLANT
TUBING INSUFFLATION (TUBING) ×3 IMPLANT
YANKAUER SUCT BULB TIP NO VENT (SUCTIONS) ×6 IMPLANT

## 2016-05-01 NOTE — Op Note (Signed)
Preoperative Diagnosis: Incarcerated ventral incisional hernia  Postoprative Diagnosis: Same   Procedure: Procedure(s): Repair of incarcerated ventral hernia with mesh using retrorectus TAR technique, repair of enterotomy   Surgeon: Excell Seltzer T   Assistants: Jonetta Osgood  Anesthesia:  General endotracheal anesthesia  Indications: Patient is a 65 year old female with morbid obesity and multiple previous abdominal operations including open gastric bypass, infected lap band placement with removal, partial colectomy and multiple ventral hernia repairs with mesh. [place]. She presents with abdominal pain and CT scan shows an incarcerated ventral incisional hernia containing a portion of the transverse colon with partial obstruction and thickening of the colon. With 2 days of bowel rest she has not improved with a tender hernia mass on exam. She does not appear completely obstructed. After discussion of options and risks of surgery detailed S we have elected to proceed with repair of her ventral incisional hernia using mesh with a retrorectus TAR technique.    Procedure Detail: Patient was brought to the operating room, placed in the supine position on the operating table and general endotracheal anesthesia induced. She received preoperative IV antibiotics. PAS replaced. Foley catheter was placed. The abdomen was widely sterilely prepped and draped. Patient timeout was performed and correct procedure verified. The previous upper midline incision was used from the xiphoid down to the umbilicus. The hernia defect on exam and CT scan was in the left upper quadrant. There also were noted to be some Swiss cheese defects along the upper midline incision above the umbilicus on CT scan. Dissection was carried down to the midline fascia. The fascia was incised and there was mesh present below the fascia and possibly another piece above the fascia. The midline  incision was fully opened down into the peritoneum. Adhesions were carefully taken down off the anterior abdominal wall initially working on the left side. There had been noted some Swiss cheese defects in the midline. As I dissected into the left upper quadrant there was a very discrete hernia defect measuring probably 5 cm in diameter with incarcerated transverse colon there was carefully dissected out of the hernia defect with sharp and blunt dissection and the bowel reduced from the hernia. There was no damage to the colon although there appeared to be some partial relative obstruction. Extensive adhesio lysis was then performed of the entire abdomen on the left side back to free peritoneal space. Following this a similar dissection was performed on the right side completely freeing the right anterior abdominal wall back to free space. As I dissected down into the right lower quadrant there were still dense adhesions in the lower abdomen and as I came through particularly dense area I noted a small enterotomy, less than 1 cm which appeared to be an terminal ileum. There were staples at that point and dense adherence to the anterior abdominal wall at the site of a likely previous ileostomy that had been closed. The adhesions below and lateral to this were very dense and they were carefully taken down for a couple centimeters to allow full exposure of the small enterotomy. The tissue appeared healthy. There was no contamination. I could easily see the borders of the enterotomy and it was closed with several 2-0 silk sutures. At this point all gloves and instruments and suction were changed. The area was irrigated and the bowel appeared healthy with a good closure. A portion of omentum was tacked down over the repair. At this point the entire  upper abdomen had been completely cleared of adhesions. I elected to proceed with the retrorectus hernia repair and had already planned to use Phasix mesh which would be  appropriate in light of the enterotomy. Initially on the left side I incised the peritoneum just lateral to the midline and dissected initially through a lot of scar tissue and intraperitoneal mesh but as I got laterally clearly identify the retrorectus space which was mostly free. I continued this laterally going past the hernia defect and pulling the peritoneum down out of the hernia defect and keeping it intact. There were very dense adhesions from a previous left upper quadrant Coker incision that I took down partially but no hernias in this area and I did not go completely above the rib cage on the left upper quadrant. However lower in the left upper quadrant I took the dissection completely out to the edge of the rectus muscle and then deepen the dissection through the transversus abdominis into the preperitoneal space and mobilized all the way out to the left flank well beyond the previous hernia defect. On the right side a similar dissection was performed which I took To the edge of the rectus sheath. Following this the peritoneum and rectus sheath were closed over the viscera with running 0 PDS and I was able to do this without undue tension. This provided a very large preperitoneal space for placement of mesh. We measured 25 x 25 cm of area to cover and I used a 30 x 25 cm piece of Phasix ST mesh which was trimmed to size and then was placed in the preperitoneal space and #1 Novafil sutures were placed circumferentially initially at the xiphoid and below the umbilicus bringing the sutures up through the abdominal wall with a suture poller with nice taut deployment of the mesh in a superior inferior direction. 3 sutures were then placed on each side of the mesh bringing it well out laterally particularly in the left upper quadrant where I was a good ways beyond the previous defect out to the left flank and the mesh was deployed tautly in all directions. The preperitoneal space was thoroughly irrigated and  hemostasis assured. I then irrigated the mesh with double antibiotic solution. A close suction drain was left in the space. The midline fascia was closed with running looped #1 PDS begun either in the incision and tied centrally. The subcutaneous tissue was irrigated and robotic solution and closed with running 2-0 Vicryl and the skin closed with staples. Sponge needle and instrument counts were correct.    Findings: As above  Estimated Blood Loss:  200 mL         Drains: 19 Blake drain in preperitoneal space  Blood Given: none          Specimens: None        Complications:  * No complications entered in OR log *         Disposition: Enterotomy recognized and closed         Condition: stable

## 2016-05-01 NOTE — Progress Notes (Signed)
Pt arrived back to floor from surgery. LR infusing and 2LNC. Pt has mid abdominal vertical incision and JP drain on Left side. Foley Cath in place draining clear yellow urine. V/S stable. Will continue to monitor

## 2016-05-01 NOTE — Progress Notes (Signed)
Internal Medicine Attending:   I saw and examined the patient. I reviewed the resident's note and I agree with the resident's findings and plan as documented in the resident's note. Abdominal pain somewhat improved this morning, she is proceeding with surgery today.

## 2016-05-01 NOTE — Progress Notes (Signed)
Patient ID: Suzanne Macdonald, female   DOB: [birth year], 65 y.o.   MRN: [id number]  [facility] Surgery Progress Note     Subjective: No changes today, ready for surgery. Some continued LUQ pain. No BM. Denies n/v. She is NPO today.  Objective: Vital signs in last 24 hours: Temp:  [97.6 F (36.4 C)-98.9 F (37.2 C)] 98.9 F (37.2 C) (09/12 0941) Pulse Rate:  [57-65] 59 (09/12 0941) Resp:  [16-18] 16 (09/12 0941) BP: (127-152)/(44-61) 144/49 (09/12 0941) SpO2:  [93 %-99 %] 98 % (09/12 0941) Last BM Date: 04/26/16  Intake/Output from previous day: 09/11 0701 - 09/12 0700 In: 1440 [P.O.:1440] Out: 0  Intake/Output this shift: No intake/output data recorded.  PE: Gen:  Alert, NAD, pleasant Cardio: irregularly irregular, regular rate Pulm:  CTAB, effort nonlabored Abd: obese, soft, multiple abdominal scars, +BS, tender non-reducable LUQ hernia   Lab Results:   Recent Labs  04/28/16 1439 04/29/16 1200  WBC 8.9 6.0  HGB 12.8 11.4*  HCT 40.7 37.6  PLT 221 212   BMET  Recent Labs  04/29/16 1200 04/30/16 0840  NA 138 142  K 4.6 4.2  CL 106 108  CO2 27 28  GLUCOSE 144* 97  BUN 7 5*  CREATININE 0.85 0.82  CALCIUM 8.4* 8.8*   PT/INR No results for input(s): LABPROT, INR in the last 72 hours. CMP     Component Value Date/Time   NA 142 04/30/2016 0840   NA 143 01/24/2016 0952   K 4.2 04/30/2016 0840   CL 108 04/30/2016 0840   CO2 28 04/30/2016 0840   GLUCOSE 97 04/30/2016 0840   BUN 5 (L) 04/30/2016 0840   BUN 19 01/24/2016 0952   CREATININE 0.82 04/30/2016 0840   CALCIUM 8.8 (L) 04/30/2016 0840   PROT 5.9 (L) 04/28/2016 1439   PROT 6.3 01/24/2016 0952   ALBUMIN 3.5 04/28/2016 1439   ALBUMIN 3.9 01/24/2016 0952   AST 43 (H) 04/28/2016 1439   ALT 46 04/28/2016 1439   ALKPHOS 67 04/28/2016 1439   BILITOT 0.5 04/28/2016 1439   BILITOT 0.3 01/24/2016 0952   GFRNONAA >60 04/30/2016 0840   GFRAA >60 04/30/2016 0840   Lipase     Component Value  Date/Time   LIPASE 19 04/28/2016 1439       Studies/Results: No results found.  Anti-infectives: Anti-infectives    Start     Dose/Rate Route Frequency Ordered Stop   05/01/16 1000  cefOXitin (MEFOXIN) 2 g in dextrose 5 % 50 mL IVPB     2 g 100 mL/hr over 30 Minutes Intravenous To Surgery 04/30/16 1342 05/02/16 1000       Assessment/Plan Incarcerated ventral hernia - surgery later today Morbid obesity H/o multiple abd surgeries including 4 hernia repairs Atrial fibrillation - on eliquis at home, flecainide Constipation - last BM 04/26/16 - continue colace and miralax postop  ID - will give cefoxitin periop FEN - NPO for surgery, IVF VTE - lovenox postop  Plan - scheduled for ventral hernia repair with mesh later today.   LOS: 3 days    Jerrye Beavers , [facility] Surgery 05/01/2016, 10:22 AM Pager: [phone] Consults: [phone] Mon-Fri 7:00 am-4:30 pm Sat-Sun 7:00 am-11:30 am

## 2016-05-01 NOTE — Transfer of Care (Signed)
Immediate Anesthesia Transfer of Care Note  Patient: Suzanne Macdonald  Procedure(s) Performed: Procedure(s): VENTRAL HERNIA REPAIR (N/A) INSERTION OF MESH (N/A)  Patient Location: PACU  Anesthesia Type:General  Level of Consciousness: awake, oriented, sedated, patient cooperative and responds to stimulation  Airway & Oxygen Therapy: Patient Spontanous Breathing and Patient connected to nasal cannula oxygen  Post-op Assessment: Report given to RN, Post -op Vital signs reviewed and stable, Patient moving all extremities and Patient moving all extremities X 4  Post vital signs: Reviewed and stable  Last Vitals:  Vitals:   05/01/16 0941 05/01/16 1711  BP: (!) 144/49 (!) 171/92  Pulse: (!) 59 78  Resp: 16 15  Temp: 37.2 C 36.7 C    Last Pain:  Vitals:   05/01/16 1711  TempSrc:   PainSc: 0-No pain         Complications: No apparent anesthesia complications

## 2016-05-01 NOTE — Anesthesia Preprocedure Evaluation (Signed)
Anesthesia Evaluation  Patient identified by MRN, date of birth, ID band Patient awake    Reviewed: Allergy & Precautions, NPO status , Patient's Chart, lab work & pertinent test results  History of Anesthesia Complications Negative for: history of anesthetic complications  Airway Mallampati: II  TM Distance: >3 FB Neck ROM: Full    Dental  (+) Edentulous Upper, Edentulous Lower   Pulmonary asthma , sleep apnea , former smoker,    breath sounds clear to auscultation       Cardiovascular hypertension, Pt. on medications and Pt. on home beta blockers  Rhythm:Regular     Neuro/Psych negative neurological ROS  negative psych ROS   GI/Hepatic Neg liver ROS, GERD  ,Ventral hernia   Endo/Other  Morbid obesity  Renal/GU      Musculoskeletal   Abdominal   Peds  Hematology negative hematology ROS (+)   Anesthesia Other Findings   Reproductive/Obstetrics                             Anesthesia Physical Anesthesia Plan  ASA: III  Anesthesia Plan: General   Post-op Pain Management:    Induction: Intravenous  Airway Management Planned: Oral ETT  Additional Equipment: None  Intra-op Plan:   Post-operative Plan: Extubation in OR  Informed Consent: I have reviewed the patients History and Physical, chart, labs and discussed the procedure including the risks, benefits and alternatives for the proposed anesthesia with the patient or authorized representative who has indicated his/her understanding and acceptance.   Dental advisory given  Plan Discussed with: CRNA and Surgeon  Anesthesia Plan Comments:         Anesthesia Quick Evaluation

## 2016-05-01 NOTE — Anesthesia Procedure Notes (Signed)
Procedure Name: Intubation Date/Time: 05/01/2016 12:58 PM Performed by: Jacquiline Doe A Pre-anesthesia Checklist: Patient identified, Emergency Drugs available, Suction available and Patient being monitored Patient Re-evaluated:Patient Re-evaluated prior to inductionOxygen Delivery Method: Circle System Utilized and Circle system utilized Preoxygenation: Pre-oxygenation with 100% oxygen Intubation Type: IV induction and Cricoid Pressure applied Ventilation: Mask ventilation without difficulty and Oral airway inserted - appropriate to patient size Laryngoscope Size: Mac and 4 Grade View: Grade I Tube type: Oral Tube size: 7.5 mm Number of attempts: 1 Airway Equipment and Method: Stylet and Oral airway Placement Confirmation: ETT inserted through vocal cords under direct vision,  positive ETCO2 and breath sounds checked- equal and bilateral Secured at: 21 cm Tube secured with: Tape Dental Injury: Teeth and Oropharynx as per pre-operative assessment

## 2016-05-01 NOTE — Progress Notes (Signed)
   Subjective:  Ms. Sieanna Vanstone was seen and evaluated today at bedside. She has no complaints except for feeling nervous about her upcoming ventral hernia repair surgery this afternoon. She denied any abdominal pain, nausea or vomiting overnight.   Objective:  Vital signs in last 24 hours: Vitals:   04/30/16 2030 05/01/16 0513 05/01/16 0914 05/01/16 0941  BP: (!) 152/61 (!) 127/44  (!) 144/49  Pulse: 62 65  (!) 59  Resp: '18 18  16  '$ Temp: 98.7 F (37.1 C) 97.6 F (36.4 C)  98.9 F (37.2 C)  TempSrc:    Oral  SpO2: 99% 98% 93% 98%  Weight:      Height:       Scheduled Meidcations . [MAR Hold] carvedilol  9.375 mg Oral BID  . cefOXitin  2 g Intravenous To OR  . [MAR Hold] cholecalciferol  2,000 Units Oral Daily  . [MAR Hold] docusate sodium  100 mg Oral Daily  . [MAR Hold] enoxaparin (LOVENOX) injection  70 mg Subcutaneous Q24H  . [MAR Hold] flecainide  50 mg Oral BID  . [MAR Hold] mometasone-formoterol  2 puff Inhalation BID  . [MAR Hold] pantoprazole  40 mg Oral Daily  . [MAR Hold] polyethylene glycol  17 g Oral Daily  . [MAR Hold] sodium chloride flush  10-40 mL Intracatheter Q12H  . [MAR Hold] traZODone  100 mg Oral QHS  . [MAR Hold] cyanocobalamin  1,000 mcg Oral Daily   Physical Exam  Constitutional: She is well-developed, well-nourished, and in no distress.  HENT:  Head: Normocephalic and atraumatic.  Cardiovascular: Intact distal pulses.   No murmur heard. Irregularly irregular.  No pedal edema.   Pulmonary/Chest: Effort normal and breath sounds normal. No respiratory distress. She has no wheezes.  Abdominal: Soft. Bowel sounds are normal. There is tenderness. There is no rebound and no guarding.  Ventral wall hernia present. Tender to palpation.   Skin: Skin is warm and dry. She is not diaphoretic.  Psychiatric: Mood, memory, affect and judgment normal.    Assessment/Plan:  Active Problems:   Incarcerated hernia  Incarcerated Ventral Hernia Pt to have  ventral hernia repair today by general surgery on a very large ventral hernia containing loop of colon. She is eagerly awaiting the procedure. Per patient she has been a little constipated so will order Colace after procedure to keep her from straining when defecating.  -Proceed with hernia repair surgery per gen surg -Will follow up their recommendations -Will give colace to ease straining with defecation as to not disrupt surgical procedure healing -Zofran PRN nausea -Awaiting further surgery recs and dispo advice.   Atrial Fibrillation Rate controlled. Was on Elliquis however has been held due to surgery today.  -Continue rate control with Flecainide.   Dispo: Anticipated discharge in approximately 1-2 day(s).   Jaden Batchelder, DO 05/01/2016, 1:12 PM Pager: [phone]

## 2016-05-01 NOTE — Care Management Important Message (Signed)
Important Message  Patient Details  Name: Suzanne Macdonald MRN: [id number] Date of Birth: Aug 31, 1950   Medicare Important Message Given:  Yes    Vinayak Bobier 05/01/2016, 1:02 PM

## 2016-05-02 ENCOUNTER — Encounter (HOSPITAL_COMMUNITY): Payer: Self-pay | Admitting: General Surgery

## 2016-05-02 DIAGNOSIS — Z9889 Other specified postprocedural states: Secondary | ICD-10-CM

## 2016-05-02 LAB — BASIC METABOLIC PANEL
ANION GAP: 8 (ref 5–15)
BUN: 10 mg/dL (ref 6–20)
CALCIUM: 8.1 mg/dL — AB (ref 8.9–10.3)
CO2: 25 mmol/L (ref 22–32)
Chloride: 104 mmol/L (ref 101–111)
Creatinine, Ser: 0.8 mg/dL (ref 0.44–1.00)
Glucose, Bld: 147 mg/dL — ABNORMAL HIGH (ref 65–99)
Potassium: 5.4 mmol/L — ABNORMAL HIGH (ref 3.5–5.1)
Sodium: 137 mmol/L (ref 135–145)

## 2016-05-02 LAB — CBC
HCT: 37.3 % (ref 36.0–46.0)
Hemoglobin: 11.3 g/dL — ABNORMAL LOW (ref 12.0–15.0)
MCH: 24.9 pg — ABNORMAL LOW (ref 26.0–34.0)
MCHC: 30.3 g/dL (ref 30.0–36.0)
MCV: 82.2 fL (ref 78.0–100.0)
PLATELETS: 257 10*3/uL (ref 150–400)
RBC: 4.54 MIL/uL (ref 3.87–5.11)
RDW: 16 % — AB (ref 11.5–15.5)
WBC: 14.9 10*3/uL — AB (ref 4.0–10.5)

## 2016-05-02 MED ORDER — SODIUM CHLORIDE 0.9 % IV SOLN
INTRAVENOUS | Status: DC
  Administered 2016-05-02 – 2016-05-04 (×5): via INTRAVENOUS
  Administered 2016-05-04: 1000 mL via INTRAVENOUS
  Administered 2016-05-04: 11:00:00 via INTRAVENOUS

## 2016-05-02 MED ORDER — DOCUSATE SODIUM 100 MG PO CAPS
100.0000 mg | ORAL_CAPSULE | Freq: Two times a day (BID) | ORAL | Status: DC
  Administered 2016-05-02 (×2): 100 mg via ORAL
  Filled 2016-05-02 (×2): qty 1

## 2016-05-02 NOTE — Progress Notes (Signed)
Internal Medicine Attending:   I saw and examined the patient. I reviewed the resident's note and I agree with the resident's findings and plan as documented in the resident's note. Patient is understandably in some pain post op, has not had bowel movement.  Pain medications are helping, she is taking oral medications but otherwise is NPO.  Is currently in chair but is not walking around.  She does have positive bowel sounds but requested that I not palpate her abdomen.  Otherwise appears to be doing well, would favor restarting eliquis 24 hours post surgery if OK with Gen Surg.

## 2016-05-02 NOTE — Progress Notes (Signed)
ANTICOAGULATION CONSULT NOTE  Pharmacy Consult for Lovenox Indication: DVT prophylaxis  Allergies  Allergen Reactions  . Iodine Hives    Patient Measurements: Height: '5\' 2"'$  (157.5 cm) Weight: (!) 312 lb 6.3 oz (141.7 kg) IBW/kg (Calculated) : 50.1 Heparin Dosing Weight: 85kg  Vital Signs: Temp: 99.8 F (37.7 C) (09/13 0455) Temp Source: Oral (09/12 2139) BP: 121/54 (09/13 0455) Pulse Rate: 77 (09/13 0455)  Labs:  Recent Labs  04/29/16 1200 04/30/16 0840 05/02/16 0445  HGB 11.4*  --  11.3*  HCT 37.6  --  37.3  PLT 212  --  257  CREATININE 0.85 0.82 0.80    Estimated Creatinine Clearance: 96 mL/min (by C-G formula based on SCr of 0.8 mg/dL).  Assessment: 65yo female presents w/ need for surgical management of hernia, on Eliquis PTA for Afib (last dose 9/9 at 0900), plan was to start heparin bridge, however patient was considered low risk of stroke and decision made to switch to Lovenox for DVT prophylaxis.  She did go for hernia surgery yesterday- restarted on Lovenox last evening ~2200.  Hgb 11.3, plts 257- stable post-op. Some bleeding last night from surgical site per RN.  Goal of Therapy:  Monitor platelets by anticoagulation protocol: Yes   Plan:  Lovenox '70mg'$  subQ q24h Follow s/s bleeding and CBC q72h at minimum Follow plans to restart Eliquis now that post-op  Cecilia Nishikawa D. Rejina Odle, PharmD, BCPS Clinical Pharmacist Pager: [phone] 05/02/2016 9:33 AM

## 2016-05-02 NOTE — Progress Notes (Signed)
1 Day Post-Op  Subjective: She is in so much pain she doesn't want to be touched. Up in chair says they are giving her Tylenol.  No Morphine since 8:30 AM  Objective: Vital signs in last 24 hours: Temp:  [97.7 F (36.5 C)-99.8 F (37.7 C)] 98.6 F (37 C) (09/13 1045) Pulse Rate:  [61-80] 80 (09/13 1045) Resp:  [11-22] 20 (09/13 1045) BP: (121-189)/(54-92) 134/63 (09/13 1045) SpO2:  [94 %-100 %] 98 % (09/13 1045) Weight:  [141.7 kg (312 lb 6.3 oz)] 141.7 kg (312 lb 6.3 oz) (09/12 2139) Last BM Date: 04/26/16 Afebrile, VSS wBC is up to 14.9 K=5.4 Intake/Ouatput from previous day: 09/12 0701 - 09/13 0700 In: 4426.7 [P.O.:360; I.V.:3816.7; IV Piggyback:250] Out: 0539 [Urine:1250; Drains:280; Blood:250] Intake/Output this shift: Total I/O In: 0  Out: 100 [Drains:100]  General appearance: alert, cooperative and no distress GI: she doesn't even want to move the blanket.  Lab Results:   Recent Labs  04/29/16 1200 05/02/16 0445  WBC 6.0 14.9*  HGB 11.4* 11.3*  HCT 37.6 37.3  PLT 212 257    BMET  Recent Labs  04/30/16 0840 05/02/16 0445  NA 142 137  K 4.2 5.4*  CL 108 104  CO2 28 25  GLUCOSE 97 147*  BUN 5* 10  CREATININE 0.82 0.80  CALCIUM 8.8* 8.1*   PT/INR No results for input(s): LABPROT, INR in the last 72 hours.   Recent Labs Lab 04/28/16 1439  AST 43*  ALT 46  ALKPHOS 67  BILITOT 0.5  PROT 5.9*  ALBUMIN 3.5     Lipase     Component Value Date/Time   LIPASE 19 04/28/2016 1439     Studies/Results: No results found. Prior to Admission medications   Medication Sig Start Date End Date Taking? Authorizing Provider  acetaminophen (TYLENOL) 500 MG tablet Take 1,000 mg by mouth every 6 (six) hours as needed for mild pain or moderate pain.   Yes Historical Provider, MD  albuterol (PROVENTIL HFA;VENTOLIN HFA) 108 (90 Base) MCG/ACT inhaler Inhale 1-2 puffs into the lungs every 6 (six) hours as needed for wheezing or shortness of breath.   Yes  Historical Provider, MD  apixaban (ELIQUIS) 5 MG TABS tablet Take 1 tablet (5 mg total) by mouth 2 (two) times daily. 03/13/16  Yes Shela Leff, MD  atorvastatin (LIPITOR) 10 MG tablet take 1 tablet by mouth once daily 04/04/16  Yes Shela Leff, MD  budesonide-formoterol [facility]) 160-4.5 MCG/ACT inhaler Inhale 2 puffs into the lungs 2 (two) times daily. 01/24/16  Yes Milagros Loll, MD  Calcium-Phosphorus-Vitamin D (CALCIUM/VITAMIN D3/ADULT GUMMY PO) Take 2 each by mouth daily.   Yes Historical Provider, MD  carvedilol (COREG) 6.25 MG tablet Take 1.5 tablets (9.375 mg total) by mouth 2 (two) times daily. 04/27/16  Yes [name], MD  cholecalciferol (VITAMIN D) 1000 units tablet Take 2,000 Units by mouth daily.    Yes Historical Provider, MD  cyanocobalamin 500 MCG tablet Take 1,000 mcg by mouth daily.    Yes Historical Provider, MD  flecainide (TAMBOCOR) 50 MG tablet Take 1 tablet (50 mg total) by mouth 2 (two) times daily. 04/27/16  Yes [name], MD  hydrochlorothiazide (HYDRODIURIL) 25 MG tablet Take 0.5 tablets (12.5 mg total) by mouth daily. 04/27/16  Yes [name], MD  Multiple Vitamins-Minerals (ALIVE WOMENS GUMMY) CHEW Chew 2 each by mouth daily.   Yes Historical Provider, MD  omeprazole (PRILOSEC) 40 MG capsule Take 1 capsule (40 mg total) by mouth  daily. 04/27/16  Yes [name], MD  traZODone (DESYREL) 100 MG tablet Take 100 mg by mouth at bedtime. 04/22/16  Yes Historical Provider, MD  flecainide (TAMBOCOR) 50 MG tablet take 1 tablet by mouth twice a day 04/30/16   Shela Leff, MD  ondansetron (ZOFRAN-ODT) 8 MG disintegrating tablet Take 1 tablet (8 mg total) by mouth every 8 (eight) hours as needed for nausea or vomiting. Patient not taking: Reported on 04/28/2016 01/22/16   Davonna Belling, MD    Medications: . carvedilol  9.375 mg Oral BID  . cholecalciferol  2,000 Units Oral Daily  . docusate sodium  100 mg Oral BID  . enoxaparin (LOVENOX)  injection  70 mg Subcutaneous Q24H  . flecainide  50 mg Oral BID  . mometasone-formoterol  2 puff Inhalation BID  . pantoprazole  40 mg Oral Daily  . polyethylene glycol  17 g Oral Daily  . sodium chloride flush  10-40 mL Intracatheter Q12H  . traZODone  100 mg Oral QHS  . cyanocobalamin  1,000 mcg Oral Daily   . sodium chloride 100 mL/hr at 05/02/16 0853  . lactated ringers 10 mL/hr at 05/01/16 1200    Assessment/Plan Incarcerated ventral incisional hernia/s/p multiple abdominal surgeries Repair of incarcerated ventral hernia with mesh using retrorectus TAR technique, repair of enterotomy, 05/01/16, Dr. Excell Seltzer  Morbid obesity Hx of AF on Eliquis and flecainide  Hx of constipation FEN:  NPO/IV fluids ID:  Pre op only DVT:  lovenox  Plan:  She is getting all her PO meds, but otherwise NPO.  I will talk to nurse about pain  No K+ in IV fluids.  Recheck in Am    LOS: 4 days    JENNINGS,WILLARD 05/02/2016 [phone] PD 1;  Doing as expected.  Agree with Will's note.  Seen and agree. Kaylyn Lim, MD,

## 2016-05-02 NOTE — Progress Notes (Signed)
   Subjective:  Suzanne Macdonald is 1 day s/p large ventral hernia repair and was seen and evaluated today at bedside. She was in no acute distress. She reports her ventral hernia repair went well without any complications that she was aware of. She denied any fevers, chills, however does endorse abdominal pain at the surgical site. Reports it responds somewhat to morphine. Patient still has yet to have BM.   Objective:  Vital signs in last 24 hours: Vitals:   05/01/16 1800 05/01/16 1817 05/01/16 2139 05/02/16 0455  BP: (!) 183/79 (!) 178/69 (!) 161/59 (!) 121/54  Pulse: 61 63 70 77  Resp: '13 18 18 '$ (!) 22  Temp: 97.7 F (36.5 C) 98.6 F (37 C) 98.2 F (36.8 C) 99.8 F (37.7 C)  TempSrc:  Oral Oral   SpO2: 100% 100% 100% 94%  Weight:   (!) 312 lb 6.3 oz (141.7 kg)   Height:       Scheduled Medications: . carvedilol  9.375 mg Oral BID  . cholecalciferol  2,000 Units Oral Daily  . docusate sodium  100 mg Oral Daily  . enoxaparin (LOVENOX) injection  70 mg Subcutaneous Q24H  . flecainide  50 mg Oral BID  . mometasone-formoterol  2 puff Inhalation BID  . pantoprazole  40 mg Oral Daily  . polyethylene glycol  17 g Oral Daily  . sodium chloride flush  10-40 mL Intracatheter Q12H  . traZODone  100 mg Oral QHS  . cyanocobalamin  1,000 mcg Oral Daily   Physical Exam  Constitutional: She is well-developed, well-nourished, and in no distress.  Sitting upright in recliner  Cardiovascular: Exam reveals no friction rub.   No murmur heard. Irregularly irregular.   Pulmonary/Chest: Effort normal and breath sounds normal. No respiratory distress. She has no wheezes.  Abdominal: Soft. Bowel sounds are normal. She exhibits distension. There is tenderness. There is no rebound and no guarding.  Abdominal tenderness secondary to abdominal surgery yesterday  Neurological: She is alert.  Skin: Skin is warm and dry. She is not diaphoretic.    Assessment/Plan:  Active Problems:   Incarcerated  hernia  Incarcerated Ventral Hernia 1 day S/P repair with large mesh yesterday with Dr. Excell Seltzer of general surgery. Per patient and review of surgery notes there were no complications. Patient reports she is doing well post-op but complains of abdominal pain at surgical site. She denies any vomiting however does endorse nausea.  -Will await recs from surgery team.  -Zofran PRN nausea -Morphine 2-4 mg PRN mod/severe pain -Tylenol PRN pain mild/mod pain -Scheduled stool softener to reduce straining during defecation.   Atrial Fibrillation Rate controlled with Flecainide. Pt is on Elliquis at home however has been held since last Saturday in anticipation of surgery. Will likely re-start Elliquis today if ok by gen surg.  -Continue rate control with Flecainide -Will restart Eliquis likely this evening -Pt on Lovenox   Dispo: Anticipated discharge in approximately 1-2 day(s).   Anola Mcgough, DO 05/02/2016, 8:56 AM Pager: [phone]

## 2016-05-02 NOTE — Anesthesia Postprocedure Evaluation (Signed)
Anesthesia Post Note  Patient: Suzanne Macdonald  Procedure(s) Performed: Procedure(s) (LRB): VENTRAL HERNIA REPAIR (N/A) INSERTION OF MESH (N/A)  Patient location during evaluation: PACU Anesthesia Type: General Level of consciousness: awake Pain management: pain level controlled Vital Signs Assessment: post-procedure vital signs reviewed and stable Respiratory status: spontaneous breathing Cardiovascular status: stable Postop Assessment: no signs of nausea or vomiting Anesthetic complications: no    Last Vitals:  Vitals:   05/02/16 0455 05/02/16 1045  BP: (!) 121/54 134/63  Pulse: 77 80  Resp: (!) 22 20  Temp: 37.7 C 37 C    Last Pain:  Vitals:   05/02/16 1045  TempSrc: Oral  PainSc:                  Suzanne Macdonald

## 2016-05-02 NOTE — Progress Notes (Signed)
Pt has not voided since F/C dc'd this morning. Pt has had over 600cc's of IV fluid and she has tried to Void on Bedpan and Bedside Commode.  Notifed Physician via page.

## 2016-05-03 LAB — BASIC METABOLIC PANEL
Anion gap: 6 (ref 5–15)
BUN: 13 mg/dL (ref 6–20)
CALCIUM: 8.1 mg/dL — AB (ref 8.9–10.3)
CO2: 25 mmol/L (ref 22–32)
CREATININE: 0.85 mg/dL (ref 0.44–1.00)
Chloride: 107 mmol/L (ref 101–111)
GFR calc non Af Amer: >60 mL/min (ref >60)
GLUCOSE: 122 mg/dL — AB (ref 65–99)
Potassium: 4.2 mmol/L (ref 3.5–5.1)
Sodium: 138 mmol/L (ref 135–145)

## 2016-05-03 MED ORDER — MORPHINE SULFATE (PF) 2 MG/ML IV SOLN
2.0000 mg | INTRAVENOUS | Status: DC | PRN
  Administered 2016-05-03 – 2016-05-05 (×7): 2 mg via INTRAVENOUS
  Filled 2016-05-03 (×7): qty 1

## 2016-05-03 MED ORDER — TRAMADOL HCL 50 MG PO TABS
50.0000 mg | ORAL_TABLET | Freq: Four times a day (QID) | ORAL | Status: DC
  Administered 2016-05-03 – 2016-05-06 (×6): 50 mg via ORAL
  Filled 2016-05-03 (×7): qty 1

## 2016-05-03 MED ORDER — OXYCODONE HCL 5 MG PO TABS
5.0000 mg | ORAL_TABLET | ORAL | Status: DC | PRN
  Administered 2016-05-04: 5 mg via ORAL
  Filled 2016-05-03 (×2): qty 1

## 2016-05-03 MED ORDER — SENNOSIDES-DOCUSATE SODIUM 8.6-50 MG PO TABS
1.0000 | ORAL_TABLET | Freq: Two times a day (BID) | ORAL | Status: DC
  Administered 2016-05-03 – 2016-05-06 (×6): 1 via ORAL
  Filled 2016-05-03 (×6): qty 1

## 2016-05-03 MED ORDER — APIXABAN 5 MG PO TABS
5.0000 mg | ORAL_TABLET | Freq: Two times a day (BID) | ORAL | Status: DC
  Administered 2016-05-03 – 2016-05-06 (×7): 5 mg via ORAL
  Filled 2016-05-03 (×7): qty 1

## 2016-05-03 NOTE — Progress Notes (Signed)
   Subjective:  Suzanne Macdonald was seen and evaluated today at bedside. Suzanne Macdonald continues to complain of abdominal pain secondary to Suzanne Macdonald large ventral hernia repair however reports Morphine is working well to alleviate Suzanne Macdonald pain. Suzanne Macdonald reports Suzanne Macdonald continues to not have a bowel movement. Suzanne Macdonald denies any acute events overnight. Suzanne Macdonald reports Suzanne Macdonald was able to move by herself from bed to recliner without issue.  Objective:  Vital signs in last 24 hours: Vitals:   05/03/16 0122 05/03/16 0444 05/03/16 0905 05/03/16 0926  BP:  (!) 134/47 (!) 135/54   Pulse:  84 85   Resp:  20 18   Temp: 99 F (37.2 C) 98.5 F (36.9 C) 98.9 F (37.2 C)   TempSrc: Oral Oral Oral   SpO2:  97% 96% 93%  Weight:      Height:       Scheduled medications: . carvedilol  9.375 mg Oral BID  . cholecalciferol  2,000 Units Oral Daily  . docusate sodium  100 mg Oral BID  . enoxaparin (LOVENOX) injection  70 mg Subcutaneous Q24H  . flecainide  50 mg Oral BID  . mometasone-formoterol  2 puff Inhalation BID  . pantoprazole  40 mg Oral Daily  . polyethylene glycol  17 g Oral Daily  . sodium chloride flush  10-40 mL Intracatheter Q12H  . traZODone  100 mg Oral QHS  . cyanocobalamin  1,000 mcg Oral Daily   Physical Exam  Constitutional: Suzanne Macdonald is well-developed, well-nourished, and in no distress.  Resting comfortably in recliner  HENT:  Head: Normocephalic and atraumatic.  Cardiovascular:  No murmur heard. Irregularly irregular.   Pulmonary/Chest: Effort normal and breath sounds normal. No respiratory distress. Suzanne Macdonald has no wheezes.  Abdominal: Soft. Bowel sounds are normal. Suzanne Macdonald exhibits distension. There is tenderness. There is no rebound and no guarding.  Clean dry dressings in place over surgical site. Drain with serosanguinous fluid.   Neurological: Suzanne Macdonald is alert. Suzanne Macdonald exhibits normal muscle tone.  Skin: Skin is warm and dry. Suzanne Macdonald is not diaphoretic.    Assessment/Plan:  Active Problems:   Incarcerated hernia  Incarcerated  Ventral Hernia Day 2 s/p large ventral hernia repair. Suzanne Macdonald complains of abdoimnal pain at surgical site however it is controlled with morphine. Suzanne Macdonald has been requiring 2-4 mg morphine every 3-4 hours. We will try to transition Suzanne Macdonald to oral analgesics in anticipation of discharge soon. Suzanne Macdonald reports Suzanne Macdonald hasn't had a bowel movement yet however post-op ileus is a common complication of abdominal surgery. Suzanne Macdonald is receiving colace daily to ease straining with defecation. -Oxycodone IR 5 mg Q4H PRN pain -Morphine 2 mg Q4H PRN severe pain -Surgical drain with 355 mL output yesterday.  -Zofran PRN nausea  Atrial Fibrillation  Rate controlled with Flecainide. Restarting Eliquis today. Suzanne Macdonald has been on Lovenox for DVT prophylaxis   Dispo: Anticipated discharge in approximately 1-2 day(s).   Jamilee Lafosse, DO 05/03/2016, 1:13 PM Pager: [phone]

## 2016-05-03 NOTE — Care Management Important Message (Signed)
Important Message  Patient Details  Name: Suzanne Macdonald MRN: [id number] Date of Birth: [birth year]   Medicare Important Message Given:  Yes    Genieve Ramaswamy Abena 05/03/2016, 11:21 AM

## 2016-05-03 NOTE — Progress Notes (Signed)
Patient ID: Suzanne Macdonald, female   DOB: Jun 11, 1951, 65 y.o.   MRN: [id number]  [facility] Surgery Progress Note  2 Days Post-Op  Subjective: Continues to complain of abdominal pain. States that she is very hungry. No BM or flatus. Denies n/v.  Objective: Vital signs in last 24 hours: Temp:  [98.5 F (36.9 C)-99.5 F (37.5 C)] 98.9 F (37.2 C) (09/14 0905) Pulse Rate:  [82-90] 85 (09/14 0905) Resp:  [18-20] 18 (09/14 0905) BP: (130-135)/(47-64) 135/54 (09/14 0905) SpO2:  [93 %-99 %] 93 % (09/14 0926) Last BM Date: 04/27/16  Intake/Output from previous day: 09/13 0701 - 09/14 0700 In: 2113.3 [I.V.:2113.3] Out: 1505 [Urine:1150; Drains:355] Intake/Output this shift: Total I/O In: 0  Out: 250 [Urine:200; Drains:50]  PE: Gen:  Alert, NAD, cooperative, resting in bed Cardio: irregularly irregular, regular rate Pulm: effort nonlabored Abd: Soft, obese, hypoactive BS, incisions C/D/I with staples intact, drain with minimal serosanguinous drainage (per patient drain was just emptied)  JP drain 355cc output/24hr  Lab Results:   Recent Labs  05/02/16 0445  WBC 14.9*  HGB 11.3*  HCT 37.3  PLT 257   BMET  Recent Labs  05/02/16 0445 05/03/16 0500  NA 137 138  K 5.4* 4.2  CL 104 107  CO2 25 25  GLUCOSE 147* 122*  BUN 10 13  CREATININE 0.80 0.85  CALCIUM 8.1* 8.1*   PT/INR No results for input(s): LABPROT, INR in the last 72 hours. CMP     Component Value Date/Time   NA 138 05/03/2016 0500   NA 143 01/24/2016 0952   K 4.2 05/03/2016 0500   CL 107 05/03/2016 0500   CO2 25 05/03/2016 0500   GLUCOSE 122 (H) 05/03/2016 0500   BUN 13 05/03/2016 0500   BUN 19 01/24/2016 0952   CREATININE 0.85 05/03/2016 0500   CALCIUM 8.1 (L) 05/03/2016 0500   PROT 5.9 (L) 04/28/2016 1439   PROT 6.3 01/24/2016 0952   ALBUMIN 3.5 04/28/2016 1439   ALBUMIN 3.9 01/24/2016 0952   AST 43 (H) 04/28/2016 1439   ALT 46 04/28/2016 1439   ALKPHOS 67 04/28/2016 1439   BILITOT  0.5 04/28/2016 1439   BILITOT 0.3 01/24/2016 0952   GFRNONAA >60 05/03/2016 0500   GFRAA >60 05/03/2016 0500   Lipase     Component Value Date/Time   LIPASE 19 04/28/2016 1439       Studies/Results: No results found.  Anti-infectives: Anti-infectives    Start     Dose/Rate Route Frequency Ordered Stop   05/01/16 1452  polymyxin B 500,000 Units, bacitracin 50,000 Units in sodium chloride irrigation 0.9 % 500 mL irrigation  Status:  Discontinued       As needed 05/01/16 1452 05/01/16 1707   05/01/16 1000  cefOXitin (MEFOXIN) 2 g in dextrose 5 % 50 mL IVPB     2 g 100 mL/hr over 30 Minutes Intravenous To Surgery 04/30/16 1342 05/01/16 1325       Assessment/Plan S/p Repair of incarcerated ventral hernia with mesh using retrorectus TAR technique, repair of enterotomy 05/01/16 Dr. Excell Seltzer - POD 2 - continues to complain of abdominal pain but exam much more tolerable today - no BM or flatus. Restart colace. - drain with 355cc output yesterday  FEN - NPO, IVF VTE - lovenox  Plan - awaiting return of bowel function, encouraged mobilization.   LOS: 5 days    Suzanne Macdonald , [facility] Surgery 05/03/2016, 11:45 AM Pager: [phone] Consults: [phone] Mon-Fri 7:00  am-4:30 pm Sat-Sun 7:00 am-11:30 am

## 2016-05-03 NOTE — Progress Notes (Signed)
Internal Medicine Attending:   I saw and examined the patient. I reviewed the resident's note and I agree with the resident's findings and plan as documented in the resident's note. Abd pain improved but no BM or flatus.  Hopefully we can start to mobilize her.  Will start oral pain medications.  Will attempt to remove foley catheter today.  Restarting eliquis.

## 2016-05-04 LAB — CBC
HCT: 28.8 % — ABNORMAL LOW (ref 36.0–46.0)
HCT: 29.5 % — ABNORMAL LOW (ref 36.0–46.0)
HEMOGLOBIN: 8.5 g/dL — AB (ref 12.0–15.0)
Hemoglobin: 8.9 g/dL — ABNORMAL LOW (ref 12.0–15.0)
MCH: 24.7 pg — AB (ref 26.0–34.0)
MCH: 25 pg — ABNORMAL LOW (ref 26.0–34.0)
MCHC: 29.9 g/dL — ABNORMAL LOW (ref 30.0–36.0)
MCHC: 30.2 g/dL (ref 30.0–36.0)
MCV: 82.8 fL (ref 78.0–100.0)
MCV: 82.9 fL (ref 78.0–100.0)
PLATELETS: 189 10*3/uL (ref 150–400)
PLATELETS: 181 10*3/uL (ref 150–400)
RBC: 3.48 MIL/uL — AB (ref 3.87–5.11)
RBC: 3.56 MIL/uL — ABNORMAL LOW (ref 3.87–5.11)
RDW: 16 % — ABNORMAL HIGH (ref 11.5–15.5)
RDW: 15.9 % — AB (ref 11.5–15.5)
WBC: 9.1 10*3/uL (ref 4.0–10.5)
WBC: 8.4 10*3/uL (ref 4.0–10.5)

## 2016-05-04 MED ORDER — ACETAMINOPHEN-CODEINE #3 300-30 MG PO TABS
1.0000 | ORAL_TABLET | ORAL | Status: DC | PRN
  Administered 2016-05-04 – 2016-05-06 (×5): 2 via ORAL
  Filled 2016-05-04 (×6): qty 2

## 2016-05-04 MED ORDER — INFLUENZA VAC SPLIT QUAD 0.5 ML IM SUSY
0.5000 mL | PREFILLED_SYRINGE | Freq: Once | INTRAMUSCULAR | Status: AC
  Administered 2016-05-04: 0.5 mL via INTRAMUSCULAR

## 2016-05-04 MED ORDER — ACETAMINOPHEN-CODEINE #3 300-30 MG PO TABS: 1.0000 | ORAL_TABLET | ORAL | Status: DC | PRN

## 2016-05-04 MED ORDER — INFLUENZA VAC SPLIT QUAD 0.5 ML IM SUSY: 0.5000 mL | PREFILLED_SYRINGE | INTRAMUSCULAR | Status: DC

## 2016-05-04 MED ORDER — CHLORHEXIDINE GLUCONATE 0.12 % MT SOLN
15.0000 mL | Freq: Two times a day (BID) | OROMUCOSAL | Status: DC
  Administered 2016-05-04 – 2016-05-06 (×5): 15 mL via OROMUCOSAL
  Filled 2016-05-04 (×4): qty 15

## 2016-05-04 MED ORDER — ORAL CARE MOUTH RINSE
15.0000 mL | Freq: Two times a day (BID) | OROMUCOSAL | Status: DC
  Administered 2016-05-04 – 2016-05-06 (×4): 15 mL via OROMUCOSAL

## 2016-05-04 NOTE — Progress Notes (Signed)
Internal Medicine Attending:   I saw and examined the patient. I reviewed the resident's note and I agree with the resident's findings and plan as documented in the resident's note. NO BM yet, will try to remove foley and get ambulating more.

## 2016-05-04 NOTE — Progress Notes (Signed)
   Subjective:  [name] was seen and evaluated today at bedside. She reports her abdominal pain continues to improve however is still present. Per review of chart she was started on Ultram last night by gen surg. Patient reports this does not seem to help her pain significantly and has instead been using Morphine for pain control. When asked about Oxy IR she states she doesn't want to take that medicine as it makes her constipated. Denies any BM or flatus.   Objective:  Vital signs in last 24 hours: Vitals:   05/03/16 2044 05/03/16 2056 05/04/16 0640 05/04/16 0837  BP:  (!) 126/45 (!) 125/51 (!) 126/47  Pulse:  76 73 73  Resp:  '18 19 20  '$ Temp:  98.6 F (37 C) 98.4 F (36.9 C) 98.6 F (37 C)  TempSrc:  Oral Oral Oral  SpO2: 92% 96% 96% 95%  Weight:  (!) 311 lb 15.2 oz (141.5 kg)    Height:       Physical Exam  Constitutional: She is well-developed, well-nourished, and in no distress.  Resting comfortably in bed.   Cardiovascular: Exam reveals no friction rub.   No murmur heard. Irregularly irregular. Rate controlled.   Pulmonary/Chest: Effort normal and breath sounds normal. No respiratory distress. She has no wheezes.  Abdominal: Soft. She exhibits no distension. There is tenderness. There is no rebound and no guarding.  Hypoactive bowel sounds  Neurological: She is alert. She exhibits normal muscle tone.  Skin: Skin is warm and dry. She is not diaphoretic.     Assessment/Plan:  Principal Problem:   Incarcerated hernia Active Problems:   Essential hypertension   History of gastric bypass   Paroxysmal atrial fibrillation (HCC)   Morbid obesity ([place])  Incarcerated Hernia Day 3 s/p large ventral hernia repair with general surgery. Reports abdominal pain is improving however is still significant for the patient. We tried transitioning her to oral pain medications however she did not take any of the Oxy IR as she took it before with s/e of constipation and reports she  doesn't want to take anything that will constipate her further. She was informed that all opioid narcotics cause some degree of constipation and that she is on stool softeners she still did not want to try oral opioids. She has been using IV Morphine '2mg'$  approximately Q4H for pain control. General surgery started Ultram however she reports it hasn't helped her pain. Gen surg added on Senna to her Colace in attempt to get her bowels moving.  -Tylenol #3 1-2 tabs Q4H PRN ordered.  -Morphine 2 mg IV Q4H PRN severe pain also continued but hopefully patient will find relief with PO pain meds -Surgical drain in place -Zofran prn  Paroxysmal Atrial Fibrillation Eliquis restarted yesterday. Continue Flecainide for rate control.   Morbid Obesity Nutrition saw patient today.  Dispo: Anticipated discharge in approximately 1-2 day(s).   Suzanne Pendelton, DO 05/04/2016, 8:48 AM Pager: [phone]

## 2016-05-04 NOTE — Progress Notes (Signed)
Patient ID: Suzanne Macdonald, female   DOB: 1950-11-01, 65 y.o.   MRN: [id number]  [facility] Surgery Progress Note  3 Days Post-Op  Subjective: Abdominal pain is less than yesterday. States that she is burping, but has no BM or flatus. Denies n/v. Asking when she will get to go home. Not getting out of bed very much.  Objective: Vital signs in last 24 hours: Temp:  [98.4 F (36.9 C)-98.9 F (37.2 C)] 98.6 F (37 C) (09/15 0837) Pulse Rate:  [73-85] 73 (09/15 0837) Resp:  [17-20] 20 (09/15 0837) BP: (125-135)/(41-54) 126/47 (09/15 0837) SpO2:  [92 %-96 %] 95 % (09/15 0837) Weight:  [311 lb 15.2 oz (141.5 kg)] 311 lb 15.2 oz (141.5 kg) (09/14 2056) Last BM Date: 04/27/16  Intake/Output from previous day: 09/14 0701 - 09/15 0700 In: 938.3 [P.O.:40; I.V.:898.3] Out: 1250 [Urine:1175; Drains:75] Intake/Output this shift: No intake/output data recorded.  PE: Gen:  Alert, NAD, cooperative, resting in bed Cardio: irregularly irregular, regular rate Pulm: effort nonlabored Abd: Soft, obese, hypoactive BS, incisions C/D/I with staples intact, drain with minimal serosanguinous drainage  JP drain 75cc output/24hr  Lab Results:   Recent Labs  05/02/16 0445 05/04/16 0430  WBC 14.9* 9.1  HGB 11.3* 8.5*  HCT 37.3 28.8*  PLT 257 189   BMET  Recent Labs  05/02/16 0445 05/03/16 0500  NA 137 138  K 5.4* 4.2  CL 104 107  CO2 25 25  GLUCOSE 147* 122*  BUN 10 13  CREATININE 0.80 0.85  CALCIUM 8.1* 8.1*   PT/INR No results for input(s): LABPROT, INR in the last 72 hours. CMP     Component Value Date/Time   NA 138 05/03/2016 0500   NA 143 01/24/2016 0952   K 4.2 05/03/2016 0500   CL 107 05/03/2016 0500   CO2 25 05/03/2016 0500   GLUCOSE 122 (H) 05/03/2016 0500   BUN 13 05/03/2016 0500   BUN 19 01/24/2016 0952   CREATININE 0.85 05/03/2016 0500   CALCIUM 8.1 (L) 05/03/2016 0500   PROT 5.9 (L) 04/28/2016 1439   PROT 6.3 01/24/2016 0952   ALBUMIN 3.5 04/28/2016  1439   ALBUMIN 3.9 01/24/2016 0952   AST 43 (H) 04/28/2016 1439   ALT 46 04/28/2016 1439   ALKPHOS 67 04/28/2016 1439   BILITOT 0.5 04/28/2016 1439   BILITOT 0.3 01/24/2016 0952   GFRNONAA >60 05/03/2016 0500   GFRAA >60 05/03/2016 0500   Lipase     Component Value Date/Time   LIPASE 19 04/28/2016 1439       Studies/Results: No results found.  Anti-infectives: Anti-infectives    Start     Dose/Rate Route Frequency Ordered Stop   05/01/16 1452  polymyxin B 500,000 Units, bacitracin 50,000 Units in sodium chloride irrigation 0.9 % 500 mL irrigation  Status:  Discontinued       As needed 05/01/16 1452 05/01/16 1707   05/01/16 1000  cefOXitin (MEFOXIN) 2 g in dextrose 5 % 50 mL IVPB     2 g 100 mL/hr over 30 Minutes Intravenous To Surgery 04/30/16 1342 05/01/16 1325       Assessment/Plan S/p Repair of incarcerated ventral hernia with mesh using retrorectus TAR technique, repair of enterotomy 05/01/16 Dr. Excell Seltzer - POD 3 - normal WBC, afebrile - hemoglobin 8.5, continue to monitor - abdominal pain improving - no BM or flatus - drain output decreasing: 75cc/24hr  FEN - NPO, IVF VTE - lovenox  Plan - awaiting return of bowel function. PT  for mobilization. Labs tomorrow.   LOS: 6 days    Jerrye Beavers , [facility] Surgery 05/04/2016, 8:56 AM Pager: [phone] Consults: [phone] Mon-Fri 7:00 am-4:30 pm Sat-Sun 7:00 am-11:30 am

## 2016-05-04 NOTE — Progress Notes (Signed)
Initial Nutrition Assessment  DOCUMENTATION CODES:   Morbid obesity  INTERVENTION:  Diet advancement per MD as appropriate.  RD to continue to monitor.   NUTRITION DIAGNOSIS:   Inadequate oral intake related to inability to eat as evidenced by NPO status.  GOAL:   Patient will meet greater than or equal to 90% of their needs  MONITOR:   Diet advancement, Labs, Weight trends, Skin, I & O's  REASON FOR ASSESSMENT:   NPO/Clear Liquid Diet    ASSESSMENT:   65yo female with PMH of HTN, OSA, asthma, A fib on Eliquis, h/o colonic carcinoma s/p resection, h/o gastric bypass with multiple hernia repairs presenting to the ED with a 3 day history of abdominal pain and back pain.  S/p Repair of incarcerated ventral hernia with mesh using retrorectus TAR technique, repair of enterotomy 05/01/16.  Pt reports no abdominal pains during time of visit. Noted no BM since admission. Per MD note, plans to get pt ambulating more. Pt reports eating well PTA with no other difficulties. RD to continue to monitor and order nutritional supplements as appropriate once diet advances.  Pt with no observed significant fat or muscle mass loss.   Labs and medications reviewed.    Diet Order:  Diet NPO time specified Except for: Sips with Meds  Skin:   (Incision on abdomen)  Last BM:  9/8  Height:   Ht Readings from Last 1 Encounters:  04/28/16 '5\' 2"'$  (1.575 m)    Weight:   Wt Readings from Last 1 Encounters:  05/03/16 (!) 311 lb 15.2 oz (141.5 kg)    Ideal Body Weight:  50 kg  BMI:  Body mass index is 57.06 kg/m.  Estimated Nutritional Needs:   Kcal:  2000-2200  Protein:  115-135 grams  Fluid:  >/= 2 L/day  EDUCATION NEEDS:   No education needs identified at this time  Corrin Parker, MS, RD, LDN Pager # [phone] After hours/ weekend pager # [phone]

## 2016-05-05 LAB — BASIC METABOLIC PANEL
Anion gap: 6 (ref 5–15)
BUN: 8 mg/dL (ref 6–20)
CALCIUM: 7.9 mg/dL — AB (ref 8.9–10.3)
CO2: 23 mmol/L (ref 22–32)
CREATININE: 0.67 mg/dL (ref 0.44–1.00)
Chloride: 109 mmol/L (ref 101–111)
GFR calc Af Amer: >60 mL/min (ref >60)
GLUCOSE: 108 mg/dL — AB (ref 65–99)
Potassium: 3.6 mmol/L (ref 3.5–5.1)
SODIUM: 138 mmol/L (ref 135–145)

## 2016-05-05 LAB — CBC
HCT: 28.5 % — ABNORMAL LOW (ref 36.0–46.0)
Hemoglobin: 8.7 g/dL — ABNORMAL LOW (ref 12.0–15.0)
MCH: 25 pg — AB (ref 26.0–34.0)
MCHC: 30.5 g/dL (ref 30.0–36.0)
MCV: 81.9 fL (ref 78.0–100.0)
PLATELETS: 205 10*3/uL (ref 150–400)
RBC: 3.48 MIL/uL — AB (ref 3.87–5.11)
RDW: 15.7 % — AB (ref 11.5–15.5)
WBC: 8.5 10*3/uL (ref 4.0–10.5)

## 2016-05-05 MED ORDER — MORPHINE SULFATE (PF) 2 MG/ML IV SOLN: 2.0000 mg | Freq: Four times a day (QID) | INTRAVENOUS | Status: DC | PRN

## 2016-05-05 NOTE — Progress Notes (Addendum)
   Subjective:  Pt had a bowel movement and is passing gas. She reports tylenol #3 has been working for her pain. She has been able to ambulate to the restroom.    Objective:  Vital signs in last 24 hours: Vitals:   05/04/16 2007 05/05/16 0430 05/05/16 0818 05/05/16 1000  BP: (!) 135/45 (!) 131/54  (!) 139/55  Pulse: 73 74  72  Resp: '19 20  18  '$ Temp: 98.9 F (37.2 C) 98.7 F (37.1 C)  98.2 F (36.8 C)  TempSrc: Oral Oral  Oral  SpO2: 96% 97% 97% 98%  Weight: (!) 316 lb 5.8 oz (143.5 kg)     Height:       Physical Exam  Constitutional: She is well-developed, well-nourished, and in no distress.  Resting comfortably in bed.   Cardiovascular: Normal rate, regular rhythm and normal heart sounds.  Exam reveals no friction rub.   No murmur heard. Pulmonary/Chest: Effort normal and breath sounds normal. No respiratory distress. She has no wheezes.  Abdominal: Soft. Bowel sounds are normal. She exhibits no distension. There is tenderness. There is no rebound and no guarding.  Sx dressings dry and intact  Neurological: She is alert. She exhibits normal muscle tone.  Skin: Skin is warm and dry. She is not diaphoretic.     Assessment/Plan:  Principal Problem:   Incarcerated hernia Active Problems:   Essential hypertension   History of gastric bypass   Paroxysmal atrial fibrillation (HCC)   Morbid obesity ([place])  Incarcerated Hernia Day 4 s/p large ventral hernia repair with general surgery. Reports abdominal pain is improving.  -Tylenol #3 1-2 tabs Q4H PRN ordered.  -Morphine 2 mg IV Q6H PRN severe pain  -Surgical drain in place -Zofran prn - d/c'd fluids and foley - continue soft diet - hgb stable at 8.7 likely 2/2 fluids and sx  Paroxysmal Atrial Fibrillation - continue Eliquis and Flecainide     Dispo: Anticipated discharge in approximately 1-2 day(s).   Norman Herrlich, MD 05/05/2016, 12:09 PM Pager: [phone]

## 2016-05-05 NOTE — Progress Notes (Signed)
Internal Medicine Attending:   I saw and examined the patient. I reviewed the resident's note and I agree with the resident's findings and plan as documented in the resident's note.  

## 2016-05-05 NOTE — Evaluation (Signed)
Physical Therapy Evaluation Patient Details Name: Suzanne Macdonald MRN: [id number] DOB: 1951-02-20 Today's Date: 05/05/2016   History of Present Illness  65 yo female with onset of pain in abd was admitted for ventral hernia repair with incarcerated transverse colon, now POD 4.  PMHx:  gastric bypass, HTN, asthma, a-fib, colon CA,   Clinical Impression  Pt is needing reminders for protection of her surgery specifically to wear her binder.  She is asking not to since her GI tract is upset and has concerns about getting the binder dirty.  Asked not to send home as she has suggested be done since she is to wear this device when OOB.  Will follow acutely and take her to the steps next visit.    Follow Up Recommendations Home health PT;Supervision for mobility/OOB    Equipment Recommendations  None recommended by PT    Recommendations for Other Services Rehab consult     Precautions / Restrictions Precautions Precautions: Fall (telemetry) Required Braces or Orthoses: Other Brace/Splint Other Brace/Splint: abdominal binder when OOB Restrictions Weight Bearing Restrictions: No      Mobility  Bed Mobility               General bed mobility comments: up in chair when PT arrived  Transfers Overall transfer level: Needs assistance Equipment used: Rolling walker (2 wheeled);1 person hand held assist Transfers: Sit to/from [facility] Sit to Stand: Min guard Stand pivot transfers: Min guard       General transfer comment: minor cues for hand placement  Ambulation/Gait Ambulation/Gait assistance: Min guard Ambulation Distance (Feet): 35 Feet Assistive device: Rolling walker (2 wheeled);1 person hand held assist Gait Pattern/deviations: Step-through pattern;Step-to pattern;Wide base of support;Decreased stride length Gait velocity: reduced Gait velocity interpretation: Below normal speed for age/gender General Gait Details: Pt is up to walk with RW in room due  to concerns by pt she has diarrhea, asked not to go into the hall  Stairs            Wheelchair Mobility    Modified Rankin (Stroke Patients Only)       Balance Overall balance assessment: Needs assistance Sitting-balance support: Feet supported Sitting balance-Leahy Scale: Good     Standing balance support: Bilateral upper extremity supported Standing balance-Leahy Scale: Fair                               Pertinent Vitals/Pain Pain Assessment: Faces Faces Pain Scale: Hurts little more Pain Location: abdomen Pain Descriptors / Indicators: Operative site guarding Pain Intervention(s): Monitored during session;Premedicated before session;Repositioned;Limited activity within patient's tolerance    Home Living Family/patient expects to be discharged to:: Private residence Living Arrangements: Spouse/significant other Available Help at Discharge: Family;Available 24 hours/day Type of Home: House Home Access: Stairs to enter Entrance Stairs-Rails: Right;Left (can reach rails on 2 steps, can reach one rail on 4 steps) Entrance Stairs-Number of Steps: 2,4 Home Layout: One level Home Equipment: Cane - single point      Prior Function Level of Independence: Independent with assistive device(s)               Hand Dominance        Extremity/Trunk Assessment   Upper Extremity Assessment: Overall WFL for tasks assessed           Lower Extremity Assessment: Generalized weakness      Cervical / Trunk Assessment: Normal  Communication   Communication: No difficulties  Cognition Arousal/Alertness: Awake/alert Behavior During Therapy: Anxious Overall Cognitive Status: Within Functional Limits for tasks assessed                      General Comments General comments (skin integrity, edema, etc.): Pt needed reminding about her abd binder, stated she did not use today due to having diarrhea and being concerned she would get it dirty.   Stated she was sending her brace home and asked her not to , needs to wear    Exercises     Assessment/Plan    PT Assessment Patient needs continued PT services  PT Problem List Decreased strength;Decreased range of motion;Decreased activity tolerance;Decreased balance;Decreased mobility;Decreased knowledge of use of DME;Decreased safety awareness;Cardiopulmonary status limiting activity;Decreased skin integrity;Pain;Obesity          PT Treatment Interventions Gait training;DME instruction;Stair training;Functional mobility training;Therapeutic activities;Therapeutic exercise;Balance training;Neuromuscular re-education;Patient/family education    PT Goals (Current goals can be found in the Care Plan section)  Acute Rehab PT Goals Patient Stated Goal: to get feeling better PT Goal Formulation: With patient Time For Goal Achievement: 05/19/16 Potential to Achieve Goals: Good    Frequency Min 3X/week   Barriers to discharge Inaccessible home environment stairs to enter house    Co-evaluation               End of Session   Activity Tolerance: Patient tolerated treatment well;Patient limited by pain;Patient limited by fatigue Patient left: in chair;with call bell/phone within reach Nurse Communication: Mobility status         Time: 1104-1130 PT Time Calculation (min) (ACUTE ONLY): 26 min   Charges:   PT Evaluation $PT Eval Moderate Complexity: 1 Procedure PT Treatments $Gait Training: 8-22 mins   PT G Codes:        Ramond Dial 2016/05/15, 12:31 PM    Mee Hives, PT MS Acute Rehab Dept. Number: [place]

## 2016-05-05 NOTE — Progress Notes (Signed)
4 Days Post-Op  Subjective: Tolerating clears, and ws told by Medical service she would get soft diet today, but has not been ordered.  I would go to fulls first.  Drainage from jp is still bloody.  Waffle dressing is OK Few BS, she says she has some stool and wanted to to to Bathroom while I was there.  She still has foley in.  Objective: Vital signs in last 24 hours: Temp:  [98.7 F (37.1 C)-98.9 F (37.2 C)] 98.7 F (37.1 C) (09/16 0430) Pulse Rate:  [69-74] 74 (09/16 0430) Resp:  [18-20] 20 (09/16 0430) BP: (120-135)/(45-54) 131/54 (09/16 0430) SpO2:  [94 %-97 %] 97 % (09/16 0818) Weight:  [143.5 kg (316 lb 5.8 oz)] 143.5 kg (316 lb 5.8 oz) (09/15 2007) Last BM Date: 04/27/16 600 PO recorded Urine 1450  Drain 60  BM x 1 recorded Afebrile, VSS Labs OK, H/H is down, but stable for the last 3 day No films Intake/Output from previous day: 09/15 0701 - 09/16 0700 In: 4650 [P.O.:600; I.V.:3955] Out: 1510 [Urine:1450; Drains:60] Intake/Output this shift: No intake/output data recorded.  General appearance: alert, cooperative and no distress Resp: clear to auscultation bilaterally GI: soft sitting up on end of chair waiting for someone to take her to BR.+ BS. Drain still has blood in it.  Lab Results:   Recent Labs  05/04/16 1301 05/05/16 0445  WBC 8.4 8.5  HGB 8.9* 8.7*  HCT 29.5* 28.5*  PLT 181 205    BMET  Recent Labs  05/03/16 0500 05/05/16 0445  NA 138 138  K 4.2 3.6  CL 107 109  CO2 25 23  GLUCOSE 122* 108*  BUN 13 8  CREATININE 0.85 0.67  CALCIUM 8.1* 7.9*   PT/INR No results for input(s): LABPROT, INR in the last 72 hours.   Recent Labs Lab 04/28/16 1439  AST 43*  ALT 46  ALKPHOS 67  BILITOT 0.5  PROT 5.9*  ALBUMIN 3.5     Lipase     Component Value Date/Time   LIPASE 19 04/28/2016 1439     Studies/Results: No results found.  Medications: . apixaban  5 mg Oral BID  . carvedilol  9.375 mg Oral BID  . chlorhexidine  15 mL  Mouth Rinse BID  . cholecalciferol  2,000 Units Oral Daily  . flecainide  50 mg Oral BID  . mouth rinse  15 mL Mouth Rinse q12n4p  . mometasone-formoterol  2 puff Inhalation BID  . pantoprazole  40 mg Oral Daily  . polyethylene glycol  17 g Oral Daily  . senna-docusate  1 tablet Oral BID  . sodium chloride flush  10-40 mL Intracatheter Q12H  . traMADol  50 mg Oral Q6H  . traZODone  100 mg Oral QHS  . cyanocobalamin  1,000 mcg Oral Daily   . sodium chloride 1,000 mL (05/04/16 2136)  . lactated ringers 10 mL/hr at 05/01/16 1200    Assessment/Plan S/p Repair of incarcerated ventral hernia with mesh using retrorectus TAR technique, repair of enterotomy 05/01/16 Dr. Excell Seltzer - POD 4  - normal WBC, afebrile - hemoglobin 8.5, continue to monitor - abdominal pain improving - no BM or flatus - drain output decreasing: 75cc/24hr Anemia - stable  ID:Pre op only FEN - IV fluids/clears VTE - lovenox   Plan:  Note yesterday from Medicine to remove foley order is in for today. I would agree, and go to full liquids.  PT to help with mobilization. They are already ordered  LOS: 7 days    Suzanne Macdonald 05/05/2016 [phone]

## 2016-05-06 DIAGNOSIS — I700 Atherosclerosis of aorta: Secondary | ICD-10-CM

## 2016-05-06 MED ORDER — ACETAMINOPHEN-CODEINE #3 300-30 MG PO TABS: 1.0000 | ORAL_TABLET | ORAL | 0 refills | Status: DC | PRN

## 2016-05-06 MED ORDER — POLYETHYLENE GLYCOL 3350 17 G PO PACK: 17.0000 g | PACK | Freq: Every day | ORAL | 0 refills | Status: DC

## 2016-05-06 MED ORDER — SENNOSIDES-DOCUSATE SODIUM 8.6-50 MG PO TABS: 1.0000 | ORAL_TABLET | Freq: Every evening | ORAL | 0 refills | Status: DC | PRN

## 2016-05-06 MED ORDER — HEPARIN SOD (PORK) LOCK FLUSH 100 UNIT/ML IV SOLN
500.0000 [IU] | INTRAVENOUS | Status: AC | PRN
Start: 2016-05-06 — End: 2016-05-06
  Administered 2016-05-06: 500 [IU]

## 2016-05-06 NOTE — Progress Notes (Signed)
Physical Therapy Treatment Patient Details Name: Suzanne Macdonald MRN: [id number] DOB: 11-07-1950 Today's Date: 05/06/2016    History of Present Illness 65 yo female with onset of pain in abd was admitted for ventral hernia repair with incarcerated transverse colon, now POD 4.  PMHx:  gastric bypass, HTN, asthma, a-fib, colon CA,     PT Comments    Overall progressing well; stair training done; Plan is to dc home today, and PT is in agreement  Follow Up Recommendations  Home health PT;Supervision for mobility/OOB     Equipment Recommendations  None recommended by PT    Recommendations for Other Services       Precautions / Restrictions Precautions Precautions: Fall Other Brace/Splint: pt states MD advised her that she did not need to wear binder    Mobility  Bed Mobility               General bed mobility comments: up in chair when PT arrived  Transfers Overall transfer level: Needs assistance Equipment used: Rolling walker (2 wheeled) Transfers: Sit to/from Stand Sit to Stand: Min guard         General transfer comment: minor cues for hand placement  Ambulation/Gait Ambulation/Gait assistance: Min guard Ambulation Distance (Feet): 100 Feet Assistive device: Rolling walker (2 wheeled) Gait Pattern/deviations: Step-through pattern;Wide base of support     General Gait Details: Cues to self-monitor for activity tolerance   Stairs Stairs: Yes Stairs assistance: Min guard Stair Management: Two rails;Step to pattern;Forwards Number of Stairs: 3 General stair comments: cues for technique; minguard for safety  Wheelchair Mobility    Modified Rankin (Stroke Patients Only)       Balance                                    Cognition Arousal/Alertness: Awake/alert Behavior During Therapy: WFL for tasks assessed/performed Overall Cognitive Status: Within Functional Limits for tasks assessed                      Exercises       General Comments        Pertinent Vitals/Pain Pain Assessment: 0-10 Pain Score: 3  Pain Location: abdomen Pain Descriptors / Indicators: Operative site guarding Pain Intervention(s): Monitored during session    Home Living                      Prior Function            PT Goals (current goals can now be found in the care plan section) Acute Rehab PT Goals Patient Stated Goal: to get feeling better PT Goal Formulation: With patient Time For Goal Achievement: 05/19/16 Potential to Achieve Goals: Good Progress towards PT goals: Progressing toward goals    Frequency    Min 3X/week      PT Plan Current plan remains appropriate    Co-evaluation             End of Session Equipment Utilized During Treatment: Gait belt Activity Tolerance: Patient tolerated treatment well Patient left: in chair;with call bell/phone within reach     Time: 1450-1515 PT Time Calculation (min) (ACUTE ONLY): 25 min  Charges:  $Gait Training: 23-37 mins                    G Codes:      Roney Marion Hamff 05/06/2016, 5:47 PM  Roney Marion, [place]  Acute Rehabilitation Services Pager [phone] Office [phone]

## 2016-05-06 NOTE — Progress Notes (Signed)
5 Days Post-Op  Subjective: Doing great, pain controlled with po meds, having bowel function, ambulating  Objective: Vital signs in last 24 hours: Temp:  [98.2 F (36.8 C)-99.8 F (37.7 C)] 99.8 F (37.7 C) (09/17 0408) Pulse Rate:  [69-102] 102 (09/17 0408) Resp:  [16-18] 16 (09/17 0408) BP: (110-139)/(44-55) 110/44 (09/17 0408) SpO2:  [96 %-99 %] 97 % (09/17 0408) Weight:  [143.6 kg (316 lb 9.3 oz)] 143.6 kg (316 lb 9.3 oz) (09/16 2006) Last BM Date: 05/05/16  Intake/Output from previous day: 09/16 0701 - 09/17 0700 In: 1780 [P.O.:580] Out: 42 [Drains:40; Stool:2] Intake/Output this shift: No intake/output data recorded.  GI: soft approp tender incision clean without infection, jp with serosang fluid  Lab Results:   Recent Labs  05/04/16 1301 05/05/16 0445  WBC 8.4 8.5  HGB 8.9* 8.7*  HCT 29.5* 28.5*  PLT 181 205   BMET  Recent Labs  05/05/16 0445  NA 138  K 3.6  CL 109  CO2 23  GLUCOSE 108*  BUN 8  CREATININE 0.67  CALCIUM 7.9*   PT/INR No results for input(s): LABPROT, INR in the last 72 hours. ABG No results for input(s): PHART, HCO3 in the last 72 hours.  Invalid input(s): PCO2, PO2  Studies/Results: No results found.  Anti-infectives: Anti-infectives    Start     Dose/Rate Route Frequency Ordered Stop   05/01/16 1452  polymyxin B 500,000 Units, bacitracin 50,000 Units in sodium chloride irrigation 0.9 % 500 mL irrigation  Status:  Discontinued       As needed 05/01/16 1452 05/01/16 1707   05/01/16 1000  cefOXitin (MEFOXIN) 2 g in dextrose 5 % 50 mL IVPB     2 g 100 mL/hr over 30 Minutes Intravenous To Surgery 04/30/16 1342 05/01/16 1325      Assessment/Plan: POD 5 S/p Repair of incarcerated ventral hernia with mesh using retrorectus TAR technique, repair of enterotomy 05/01/16 Dr. Excell Seltzer  - has return of bowel function, dc drain today, may go home today from surgical standpoint   [facility] 05/06/2016

## 2016-05-06 NOTE — Progress Notes (Signed)
   Subjective:  Suzanne Macdonald was seen and evaluated today at bedside. She had a bowel movement yesterday and continues to pass gas today. She reports tylenol #3 continues to work for her abdominal pain. Reports no issues ambulating to the restroom.   Objective:  Vital signs in last 24 hours: Vitals:   05/05/16 1755 05/05/16 2006 05/05/16 2114 05/06/16 0408  BP: (!) 127/53 (!) 117/55  (!) 110/44  Pulse: 72 69  (!) 102  Resp: '18 18  16  '$ Temp: 98.5 F (36.9 C) 98.8 F (37.1 C)  99.8 F (37.7 C)  TempSrc: Oral Oral  Oral  SpO2: 99% 96% 97% 97%  Weight:  (!) 316 lb 9.3 oz (143.6 kg)    Height:       Physical Exam  Constitutional: She is well-developed, well-nourished, and in no distress.  Resting comfortably in bed.   Cardiovascular: Normal rate and regular rhythm.  Exam reveals no friction rub.   No murmur heard. Pulmonary/Chest: Effort normal and breath sounds normal. No respiratory distress. She has no wheezes.  Abdominal: Soft. Bowel sounds are normal. She exhibits no distension. There is tenderness. There is no rebound and no guarding.  Surgical dressings dry and intact. Drain in place.   Neurological: She is alert.  Skin: Skin is warm and dry. She is not diaphoretic.    Assessment/Plan:  Principal Problem:   Incarcerated hernia Active Problems:   Essential hypertension   History of gastric bypass   Paroxysmal atrial fibrillation (HCC)   Morbid obesity (HCC)  Incarcerated Ventral Hernia Pt is day 5 s/p ventral hernia repair with general surgery. Patients abdominal pain continues to improve. Reports tylenol #3 is helping with her pain. Patient has been passing gas and having BMs.  -Tylenol #3 1-2 tabs Q4H PRN  -Surgical drain in place but is scheduled to be d/c today -Zofran prn -D/c fluids and foley -Continue soft diet -Patient will likely be d/c today as she is tolerating PO intake, pain is controlled on PO meds and is passing BM and flatus.   Paroxysmal Atrial  Fibrillation -Continuing eliquis and flecainide    Dispo: Anticipated discharge in approximately 0-1 days.  Neliah Cuyler, DO 05/06/2016, 8:29 AM Pager: [phone]

## 2016-05-06 NOTE — Discharge Instructions (Signed)
[facility]      [facility] [phone]  OPEN ABDOMINAL SURGERY: POST OP INSTRUCTIONS  Always review your discharge instruction sheet given to you by the facility where your surgery was performed.  IF YOU HAVE DISABILITY OR FAMILY LEAVE FORMS, YOU MUST BRING THEM TO THE OFFICE FOR PROCESSING.  PLEASE DO NOT GIVE THEM TO YOUR DOCTOR.  1. A prescription for pain medication may be given to you upon discharge.  Take your pain medication as prescribed, if needed.  If narcotic pain medicine is not needed, then you may take acetaminophen (Tylenol) or ibuprofen (Advil) as needed. 2. Take your usually prescribed medications unless otherwise directed. 3. If you need a refill on your pain medication, please contact your pharmacy. They will contact our office to request authorization.  Prescriptions will not be filled after 5pm or on week-ends. 4. You should follow a light diet the first few days after arrival home, such as soup and crackers, pudding, etc.unless your doctor has advised otherwise. A high-fiber, low fat diet can be resumed as tolerated.   Be sure to include lots of fluids daily. Most patients will experience some swelling and bruising on the chest and neck area.  Ice packs will help.  Swelling and bruising can take several days to resolve 5. Most patients will experience some swelling and bruising in the area of the incision. Ice pack will help. Swelling and bruising can take several days to resolve..  6. It is common to experience some constipation if taking pain medication after surgery.  Increasing fluid intake and taking a stool softener will usually help or prevent this problem from occurring.  A mild laxative (Milk of Magnesia or Miralax) should be taken according to package directions if there are no bowel movements after 48 hours. 7.  You may have steri-strips (small skin tapes) in place directly over the incision.  These strips should be left on the skin for 7-10 days.  If your  surgeon used skin glue on the incision, you may shower in 24 hours.  The glue will flake off over the next 2-3 weeks.  Any sutures or staples will be removed at the office during your follow-up visit. You may find that a light gauze bandage over your incision may keep your staples from being rubbed or pulled. You may shower and replace the bandage daily. 8. ACTIVITIES:  You may resume regular (light) daily activities beginning the next day--such as daily self-care, walking, climbing stairs--gradually increasing activities as tolerated.  You may have sexual intercourse when it is comfortable.  Refrain from any heavy lifting or straining until approved by your doctor. a. You may drive when you no longer are taking prescription pain medication, you can comfortably wear a seatbelt, and you can safely maneuver your car and apply brakes b. Return to Work: ___________________________________ 9. You should see your doctor in the office for a follow-up appointment approximately two weeks after your surgery.  Make sure that you call for this appointment within a day or two after you arrive home to insure a convenient appointment time. OTHER INSTRUCTIONS:  _____________________________________________________________ _____________________________________________________________  WHEN TO CALL YOUR DOCTOR: 1. Fever over 101.0 2. Inability to urinate 3. Nausea and/or vomiting 4. Extreme swelling or bruising 5. Continued bleeding from incision. 6. Increased pain, redness, or drainage from the incision. 7. Difficulty swallowing or breathing 8. Muscle cramping or spasms. 9. Numbness or tingling in hands or feet or around lips.  The clinic staff is available to   answer your questions during regular business hours.  Please don't hesitate to call and ask to speak to one of the nurses if you have concerns.  For further questions, please visit [email or web address]   

## 2016-05-06 NOTE — Progress Notes (Signed)
Internal Medicine Attending:   I saw and examined the patient. I reviewed the resident's note and I agree with the resident's findings and plan as documented in the resident's note. Pain controlled with PO meds, now ambulating, surgery plans to D/C drain today,  May be discharged this afternoon.

## 2016-05-06 NOTE — Discharge Summary (Signed)
Name: Suzanne Macdonald MRN: [id number] DOB: [birth year] 65 y.o. PCP: Shela Leff, MD  Date of Admission: 04/28/2016  2:45 PM Date of Discharge: 05/06/2016 Attending Physician: Lucious Groves, DO  Discharge Diagnosis: 1. Incarcerated hernia 2. Paroxysmal Atrial Fibrillation 3. Essential HTN 4. Aortic atherosclerosis  Principal Problem:   Incarcerated hernia Active Problems:   Essential hypertension   History of gastric bypass   Paroxysmal atrial fibrillation (HCC)   Morbid obesity (Suzanne Macdonald)   Discharge Medications:   Medication List    TAKE these medications   acetaminophen 500 MG tablet Commonly known as:  TYLENOL Take 1,000 mg by mouth every 6 (six) hours as needed for mild pain or moderate pain.   acetaminophen-codeine 300-30 MG tablet Commonly known as:  TYLENOL #3 Take 1-2 tablets by mouth every 4 (four) hours as needed for moderate pain or severe pain.   albuterol 108 (90 Base) MCG/ACT inhaler Commonly known as:  PROVENTIL HFA;VENTOLIN HFA Inhale 1-2 puffs into the lungs every 6 (six) hours as needed for wheezing or shortness of breath.   ALIVE WOMENS GUMMY Chew Chew 2 each by mouth daily.   apixaban 5 MG Tabs tablet Commonly known as:  ELIQUIS Take 1 tablet (5 mg total) by mouth 2 (two) times daily.   atorvastatin 10 MG tablet Commonly known as:  LIPITOR take 1 tablet by mouth once daily   budesonide-formoterol 160-4.5 MCG/ACT inhaler Commonly known as:  SYMBICORT Inhale 2 puffs into the lungs 2 (two) times daily.   CALCIUM/VITAMIN D3/ADULT GUMMY PO Take 2 each by mouth daily.   carvedilol 6.25 MG tablet Commonly known as:  COREG Take 1.5 tablets (9.375 mg total) by mouth 2 (two) times daily.   cholecalciferol 1000 units tablet Commonly known as:  VITAMIN D Take 2,000 Units by mouth daily.   cyanocobalamin 500 MCG tablet Take 1,000 mcg by mouth daily.   flecainide 50 MG tablet Commonly known as:  TAMBOCOR Take 1 tablet (50 mg total) by mouth  2 (two) times daily. What changed:  Another medication with the same name was added. Make sure you understand how and when to take each.   flecainide 50 MG tablet Commonly known as:  TAMBOCOR take 1 tablet by mouth twice a day What changed:  You were already taking a medication with the same name, and this prescription was added. Make sure you understand how and when to take each.   hydrochlorothiazide 25 MG tablet Commonly known as:  HYDRODIURIL Take 0.5 tablets (12.5 mg total) by mouth daily.   omeprazole 40 MG capsule Commonly known as:  PRILOSEC Take 1 capsule (40 mg total) by mouth daily.   ondansetron 8 MG disintegrating tablet Commonly known as:  ZOFRAN-ODT Take 1 tablet (8 mg total) by mouth every 8 (eight) hours as needed for nausea or vomiting.   polyethylene glycol packet Commonly known as:  MIRALAX / GLYCOLAX Take 17 g by mouth daily.   senna-docusate 8.6-50 MG tablet Commonly known as:  Senokot-S Take 1 tablet by mouth at bedtime as needed for mild constipation.   traZODone 100 MG tablet Commonly known as:  DESYREL Take 100 mg by mouth at bedtime.       Disposition and follow-up:   Suzanne Macdonald was discharged from [facility] in [place] condition.  At the hospital follow up visit please address:  1.  How is her abdominal pain? How is her surgical site healing? Is her pain controlled?  2.  Labs / imaging needed  at time of follow-up: none.  3.  Pending labs/ test needing follow-up: none.  Follow-up Appointments: Follow-up Information    HOXWORTH,BENJAMIN T, MD Follow up in 2 week(s).   Specialty:  General Surgery Contact information: [address]  [place] [address] [phone]           Hospital Course by problem list: Principal Problem:   Incarcerated hernia Active Problems:   Essential hypertension   History of gastric bypass   Paroxysmal atrial fibrillation (HCC)   Morbid obesity (Suzanne Macdonald)   Aortic  atherosclerosis (Suzanne Macdonald)   1. Incarcerated Ventral Hernia This is a 65 y/o obese woman with hx of 4 prior abdominal hernia repair surgeries who presents for eavluation of increasing abdominal pain at site of her incisional hernia. She has been having increased pain at this site for sevearl months however over the prior 3 days it had become severe. She also complained of significant nausea and constipation for at least 5 days prior to presentation. She had CT abdomen/pelvis which showed a large ventral hernia containing a loop of colon also with fat stranding and possible pneumatosis. General surgery was consulted and coordinated an inpatient hernia repair 9/12. Patient tolerated procedure well however had considerable pain after the surgery. She required IV morphine for 2 days post-op however was able to transition to PO pain control the last 2 days of admission. She finally passed flatus and BM during the last 2 days of admission and tolerated PO intake as well. Surgical site appeared clean, dry and without purulent drainage during her hospital stay.  2. Paroxysmal Atrial Fibrillation Patient was anticoagulated with Eliquis until 9/8 at which time she was transitioned to Lovenox in anticipation of surgery. Her Eliquis was re-started approximately 36 hours after surgical procedure and she tolerated this transition well.   3. Essential HTN Home medications were continued. Stable during hospitalization  4. Aortic Atherosclerosis Noted on CT evaluation. On statin.   Discharge Vitals:   BP (!) 119/46 (BP Location: Right Arm)   Pulse 68   Temp 98.6 F (37 C) (Oral)   Resp 18   Ht '5\' 2"'$  (1.575 m)   Wt (!) 316 lb 9.3 oz (143.6 kg)   SpO2 96%   BMI 57.90 kg/m   Pertinent Labs, Studies, and Procedures:  CT abdomen/pelvis w/o contrast: large left upper quadrant hernia containing loop of colon. Inflammatory changes present. Aortic atherosclerosis  Discharge Instructions: Please increase activity  slowly. Please take Colace and Miralax as needed for constipation. Please follow up with general surgery and your primary care physician. Please take all other medications as directed.   SignedEinar Gip, DO 05/06/2016, 3:16 PM   Pager: [phone]

## 2016-05-08 ENCOUNTER — Encounter: Payer: Self-pay | Admitting: *Deleted

## 2016-05-09 ENCOUNTER — Encounter (HOSPITAL_COMMUNITY): Payer: Self-pay | Admitting: Emergency Medicine

## 2016-05-09 ENCOUNTER — Emergency Department (HOSPITAL_COMMUNITY)
Admission: EM | Admit: 2016-05-09 | Discharge: 2016-05-09 | Disposition: A | Discharge status: Home / Self Care | Payer: Medicare Other | Attending: Emergency Medicine | Admitting: Emergency Medicine

## 2016-05-09 ENCOUNTER — Emergency Department (HOSPITAL_COMMUNITY): Payer: Medicare Other

## 2016-05-09 DIAGNOSIS — Z8541 Personal history of malignant neoplasm of cervix uteri: Secondary | ICD-10-CM | POA: Diagnosis not present

## 2016-05-09 DIAGNOSIS — R1031 Right lower quadrant pain: Secondary | ICD-10-CM | POA: Diagnosis not present

## 2016-05-09 DIAGNOSIS — J45909 Unspecified asthma, uncomplicated: Secondary | ICD-10-CM | POA: Insufficient documentation

## 2016-05-09 DIAGNOSIS — I10 Essential (primary) hypertension: Secondary | ICD-10-CM | POA: Diagnosis not present

## 2016-05-09 DIAGNOSIS — Z85038 Personal history of other malignant neoplasm of large intestine: Secondary | ICD-10-CM | POA: Diagnosis not present

## 2016-05-09 DIAGNOSIS — K5649 Other impaction of intestine: Secondary | ICD-10-CM | POA: Insufficient documentation

## 2016-05-09 DIAGNOSIS — Z87891 Personal history of nicotine dependence: Secondary | ICD-10-CM | POA: Insufficient documentation

## 2016-05-09 DIAGNOSIS — K760 Fatty (change of) liver, not elsewhere classified: Secondary | ICD-10-CM | POA: Diagnosis not present

## 2016-05-09 DIAGNOSIS — Z7901 Long term (current) use of anticoagulants: Secondary | ICD-10-CM | POA: Diagnosis not present

## 2016-05-09 DIAGNOSIS — K5900 Constipation, unspecified: Secondary | ICD-10-CM | POA: Insufficient documentation

## 2016-05-09 DIAGNOSIS — I700 Atherosclerosis of aorta: Secondary | ICD-10-CM | POA: Diagnosis present

## 2016-05-09 DIAGNOSIS — K6289 Other specified diseases of anus and rectum: Secondary | ICD-10-CM | POA: Diagnosis present

## 2016-05-09 LAB — BASIC METABOLIC PANEL
ANION GAP: 12 (ref 5–15)
BUN: 7 mg/dL (ref 6–20)
CO2: 23 mmol/L (ref 22–32)
Calcium: 8.1 mg/dL — ABNORMAL LOW (ref 8.9–10.3)
Chloride: 103 mmol/L (ref 101–111)
Creatinine, Ser: 0.77 mg/dL (ref 0.44–1.00)
GFR calc Af Amer: >60 mL/min (ref >60)
GFR calc non Af Amer: >60 mL/min (ref >60)
GLUCOSE: 93 mg/dL (ref 65–99)
POTASSIUM: 2.8 mmol/L — AB (ref 3.5–5.1)
Sodium: 138 mmol/L (ref 135–145)

## 2016-05-09 LAB — CBC WITH DIFFERENTIAL/PLATELET
BASOS ABS: 0 10*3/uL (ref 0.0–0.1)
Basophils Relative: 0 %
Eosinophils Absolute: 0.1 10*3/uL (ref 0.0–0.7)
Eosinophils Relative: 1 %
HEMATOCRIT: 30.9 % — AB (ref 36.0–46.0)
HEMOGLOBIN: 9.7 g/dL — AB (ref 12.0–15.0)
LYMPHS PCT: 18 %
Lymphs Abs: 1.4 10*3/uL (ref 0.7–4.0)
MCH: 24.9 pg — ABNORMAL LOW (ref 26.0–34.0)
MCHC: 31.4 g/dL (ref 30.0–36.0)
MCV: 79.2 fL (ref 78.0–100.0)
MONO ABS: 0.6 10*3/uL (ref 0.1–1.0)
Monocytes Relative: 8 %
NEUTROS ABS: 5.9 10*3/uL (ref 1.7–7.7)
Neutrophils Relative %: 73 %
Platelets: 278 10*3/uL (ref 150–400)
RBC: 3.9 MIL/uL (ref 3.87–5.11)
RDW: 15.9 % — ABNORMAL HIGH (ref 11.5–15.5)
WBC: 8 10*3/uL (ref 4.0–10.5)

## 2016-05-09 MED ORDER — IOPAMIDOL (ISOVUE-300) INJECTION 61%
INTRAVENOUS | Status: AC
  Filled 2016-05-09: qty 100

## 2016-05-09 MED ORDER — HEPARIN SOD (PORK) LOCK FLUSH 100 UNIT/ML IV SOLN
500.0000 [IU] | Freq: Once | INTRAVENOUS | Status: AC
  Administered 2016-05-09: 500 [IU]
  Filled 2016-05-09 (×2): qty 5

## 2016-05-09 NOTE — ED Triage Notes (Signed)
Pt reports she had recent incarcerated hernia surgery and now is experiencing rectum pain.  She reports that she "passed an egg shaped stool earlier today.... But has another one she can not pass."  She reports that she "poops myself every time I stand up."

## 2016-05-09 NOTE — ED Notes (Signed)
Patient transported to CT at this time via ED stretcher.  Pt in no apparent distress at this time.

## 2016-05-09 NOTE — ED Notes (Signed)
Pt provided with d/c instructions at this time. Pt verbalizes understanding of d/c instructions as well as follow up procedure after d/c.  No new RX at time of d/c.  Pt in no apparent distress at this time. Pt assisted out of the ED via [place] at this time.

## 2016-05-09 NOTE — ED Notes (Signed)
Pt's right subclavian power port de-accessed at this time following heparin flush administration.  Pt tolerated removal without complaint.

## 2016-05-09 NOTE — ED Notes (Signed)
Pt returned from CT at this time via ED stretcher. Pt in no apparent distress at this time. Will continue to closely monitor pt.

## 2016-05-09 NOTE — Discharge Instructions (Signed)
Continue your MiraLAX as previously prescribed.  Follow-up with your surgeon in the next week, and return to the emergency department if you develop any new or concerning symptoms.

## 2016-05-09 NOTE — ED Provider Notes (Addendum)
[facility] DEPT Provider Note   CSN: [id number] Arrival date & time: 05/09/16  2993  By signing my name below, I, Delton Prairie, attest that this documentation has been prepared under the direction and in the presence of Veryl Speak, MD  Electronically Signed: Delton Prairie, ED Scribe. 05/09/16. 2:56 AM.   History   Chief Complaint No chief complaint on file.   The history is provided by the patient. No language interpreter was used.   HPI Comments:  Suzanne Macdonald is a 65 y.o. female, with a PSHx of a ventral hernia repair, who presents to the Emergency Department complaining of constant, moderate rectum pain. Pt notes this pain began after her surgery which was performed on 05/01/16. She notes exacerbation of pain with movement. Pt states she keeps "pooping her pants". She states she passed stool in the shape of a chicken egg and is unable to pass another one. Pt has taken MiraLAX and Colace with little relief. Pt denies any fevers.   Past Medical History:  Diagnosis Date  . Asthma   . Atrial fibrillation ([place])   . Cervical cancer ([place]) 1972  . Colon cancer ([place])   . H/O gastric bypass   . Hyperlipidemia   . Hypertension   . Morbid obesity ([place]) 03/04/2016  . Sleep apnea     Patient Active Problem List   Diagnosis Date Noted  . Morbid obesity ([place]) 05/04/2016  . Incarcerated hernia 04/28/2016  . Osteopenia 03/15/2016  . Atrial fibrillation with tachycardic ventricular rate ([place])   . Asthma 03/02/2016  . Prediabetes 01/25/2016  . Essential hypertension 01/25/2016  . Vitamin D deficiency 01/25/2016  . 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 Vitamin B12 deficiency 01/25/2016  . Paroxysmal atrial fibrillation ([place]) 01/25/2016  . Health care maintenance 01/25/2016  . History of colon cancer 01/25/2016  . Ventral hernia 01/25/2016  . History of cervical cancer 01/25/2016    Past Surgical History:  Procedure Laterality Date  . ABDOMINAL HYSTERECTOMY  1998   Salpingoophorectomy. For tumorous growth.  . BOWEL RESECTION  2015   [place], [place] by Dr. Stormy Fabian  . GASTRIC BYPASS  1992  . INSERTION OF MESH N/A 05/01/2016   Procedure: INSERTION OF MESH;  Surgeon: Excell Seltzer, MD;  Location: [facility];  Service: General;  Laterality: N/A;  . LAPAROSCOPIC GASTRIC BANDING     Placed 2013 and removed in 2014.  Marland Kitchen VENTRAL HERNIA REPAIR N/A 05/01/2016   Procedure: VENTRAL HERNIA REPAIR;  Surgeon: Excell Seltzer, MD;  Location: [facility];  Service: General;  Laterality: N/A;    OB History    No data available       Home Medications    Prior to Admission medications   Medication Sig Start Date End Date Taking? Authorizing Provider  acetaminophen (TYLENOL) 500 MG tablet Take 1,000 mg by mouth every 6 (six) hours as needed for mild pain or moderate pain.    Historical Provider, MD  acetaminophen-codeine (TYLENOL #3) 300-30 MG tablet Take 1-2 tablets by mouth every 4 (four) hours as needed for moderate pain or severe pain. 05/06/16   Bethany Molt, DO  albuterol (PROVENTIL HFA;VENTOLIN HFA) 108 (90 Base) MCG/ACT inhaler Inhale 1-2 puffs into the lungs every 6 (six) hours as needed for wheezing or shortness of breath.    Historical Provider, MD  apixaban (ELIQUIS) 5 MG TABS tablet Take 1 tablet (5 mg total) by mouth 2 (two) times daily. 03/13/16   Shela Leff, MD  atorvastatin (LIPITOR) 10 MG tablet take 1  tablet by mouth once daily 04/04/16   Shela Leff, MD  budesonide-formoterol [facility]) 160-4.5 MCG/ACT inhaler Inhale 2 puffs into the lungs 2 (two) times daily. 01/24/16   Milagros Loll, MD  Calcium-Phosphorus-Vitamin D (CALCIUM/VITAMIN D3/ADULT GUMMY PO) Take 2 each by mouth daily.    Historical Provider, MD  carvedilol (COREG) 6.25 MG tablet Take 1.5 tablets (9.375 mg total) by mouth 2 (two) times daily. 04/27/16   [name], MD  cholecalciferol (VITAMIN D) 1000 units tablet Take 2,000 Units by mouth daily.     Historical Provider, MD    cyanocobalamin 500 MCG tablet Take 1,000 mcg by mouth daily.     Historical Provider, MD  flecainide (TAMBOCOR) 50 MG tablet take 1 tablet by mouth twice a day 04/30/16   Shela Leff, MD  flecainide (TAMBOCOR) 50 MG tablet Take 1 tablet (50 mg total) by mouth 2 (two) times daily. 04/27/16   [name], MD  hydrochlorothiazide (HYDRODIURIL) 25 MG tablet Take 0.5 tablets (12.5 mg total) by mouth daily. 04/27/16   [name], MD  Multiple Vitamins-Minerals (ALIVE WOMENS GUMMY) CHEW Chew 2 each by mouth daily.    Historical Provider, MD  omeprazole (PRILOSEC) 40 MG capsule Take 1 capsule (40 mg total) by mouth daily. 04/27/16   [name], MD  ondansetron (ZOFRAN-ODT) 8 MG disintegrating tablet Take 1 tablet (8 mg total) by mouth every 8 (eight) hours as needed for nausea or vomiting. Patient not taking: Reported on 04/28/2016 01/22/16   Davonna Belling, MD  polyethylene glycol [facility] Floria Raveling) packet Take 17 g by mouth daily. 05/06/16   Bethany Molt, DO  senna-docusate (SENOKOT-S) 8.6-50 MG tablet Take 1 tablet by mouth at bedtime as needed for mild constipation. 05/06/16   Bethany Molt, DO  traZODone (DESYREL) 100 MG tablet Take 100 mg by mouth at bedtime. 04/22/16   Historical Provider, MD    Family History Family History  Problem Relation Age of Onset  . Heart disease Mother   . Diabetes Mother   . Heart disease Father   . Stroke Father   . Diabetes Father   . Diabetes Sister   . Thyroid disease Sister   . Early death Brother     Killed by drunk driver  . Heart attack Brother   . Thyroid disease Daughter     s/p thyroidectomy  . Diabetes Sister   . Thyroid disease Sister   . Stroke Brother   . Drug abuse Brother   . Heart attack Brother     Social History Social History  Substance Use Topics  . Smoking status: Former Smoker    Years: 0.20    Types: Cigarettes  . Smokeless tobacco: Never Used  . Alcohol use No     Comment: a beer every now and then      Allergies   Iodine   Review of Systems Review of Systems  Constitutional: Negative for fever.  Gastrointestinal: Positive for constipation.  All other systems reviewed and are negative.    Physical Exam Updated Vital Signs BP 170/75 (BP Location: Left Arm)   Pulse 73   Temp 97.5 F (36.4 C) (Oral)   Resp 15   Ht '5\' 2"'$  (1.575 m)   Wt 300 lb (136.1 kg)   SpO2 99%   BMI 54.87 kg/m   Physical Exam  Constitutional: She is oriented to person, place, and time. She appears well-developed and well-nourished. No distress.  HENT:  Head: Normocephalic and atraumatic.  Right Ear: Hearing normal.  Left Ear: Hearing normal.  Nose: Nose normal.  Mouth/Throat: Oropharynx is clear and moist and mucous membranes are normal.  Eyes: Conjunctivae and EOM are normal. Pupils are equal, round, and reactive to light.  Neck: Normal range of motion. Neck supple.  Cardiovascular: Regular rhythm, S1 normal and S2 normal.  Exam reveals no gallop and no friction rub.   No murmur heard. Pulmonary/Chest: Effort normal and breath sounds normal. No respiratory distress. She exhibits no tenderness.  Abdominal: Soft. Normal appearance and bowel sounds are normal. There is no hepatosplenomegaly. There is no tenderness. There is no rebound, no guarding, no tenderness at McBurney's point and negative Murphy's sign. No hernia.  Midline incision with staples in place and no redness or erythema. There is mild tenderness.   Genitourinary:  Genitourinary Comments: Rectal exam was impossible secondary to pt noncompliance.   Musculoskeletal: Normal range of motion.  Neurological: She is alert and oriented to person, place, and time. She has normal strength. No cranial nerve deficit or sensory deficit. Coordination normal. GCS eye subscore is 4. GCS verbal subscore is 5. GCS motor subscore is 6.  Skin: Skin is warm, dry and intact. No rash noted. No cyanosis.  Psychiatric: She has a normal mood and affect. Her  speech is normal and behavior is normal. Thought content normal.  Nursing note and vitals reviewed.    ED Treatments / Results  DIAGNOSTIC STUDIES:  Oxygen Saturation is 97% on RA, normal by my interpretation.    COORDINATION OF CARE:  2:50 AM Discussed treatment plan with pt at bedside and pt agreed to plan.  Labs (all labs ordered are listed, but only abnormal results are displayed) Labs Reviewed - No data to display  EKG  EKG Interpretation None       Radiology No results found.  Procedures Procedures (including critical care time)  Medications Ordered in ED Medications - No data to display   Initial Impression / Assessment and Plan / ED Course  I have reviewed the triage vital signs and the nursing notes.  Pertinent labs & imaging results that were available during my care of the patient were reviewed by me and considered in my medical decision making (see chart for details).  Clinical Course    Patient presented here complaining of severe rectal and lower abdominal pain. She recently underwent umbilical hernia repair. She reports having difficulty moving her bowels and feels as though she is unable to pass the stool in her colon. I attempted to perform a rectal examination, however the patient began screaming and pushed me away with her leg, then demanded I stop.   She then underwent a CT scan to rule out any surgical complications. It did reveal what appears to be a large seroma within her incision site. It also showed residual contrast throughout the colon, likely from her CT scan 10 days ago. I discussed this finding with Dr. Gerilyn Nestle from radiology who recommended laxatives. I also discussed this finding with Dr. Rosendo Gros from general surgery. He was also in agreement that the treatment would be a bowel regimen.  I discussed these findings with the patient and informed her of the recommendations. She is to continue her MiraLAX which she is taking at home. I  offered additional pain medication, however she declined. She was also offered to be given an enema, however given her response to the attempt at a rectal examination I do not feel as though she could tolerate this.  She will be discharged to home  with instructions to continue her MiraLAX.  Final Clinical Impressions(s) / ED Diagnoses   Final diagnoses:  None    New Prescriptions New Prescriptions   No medications on file   I personally performed the services described in this documentation, which was scribed in my presence. The recorded information has been reviewed and is accurate.        Veryl Speak, MD 05/09/16 [place], MD 05/09/16 [phone]

## 2016-05-09 NOTE — ED Notes (Signed)
Pt reports that she was incontinent of stool at this time. Pt cleansed and linens changed at this time.  Pt was noted to be incontinent of a small amount of liquid stool.

## 2016-05-09 NOTE — ED Notes (Signed)
Pt reports that she was incontinent of stool while at Ct.  Pt cleansed and linens changed at this time.

## 2016-05-15 ENCOUNTER — Telehealth: Payer: Self-pay | Admitting: Licensed Clinical Social Worker

## 2016-05-15 NOTE — Telephone Encounter (Signed)
Ms. Garabedian was referred to CSW for transportation resources.  Pt states she is unable to make afternoon appointments due to lack of access to transportation in the afternoons.  Ms. Fenter has Medicare/Medicaid.  Pt medicaid provides benefit of medical transportation.  CSW placed called to pt.  CSW left message stating pt's insurance provides medical transportation and requested call back. CSW provided contact hours and phone number.

## 2016-05-18 ENCOUNTER — Encounter: Payer: Self-pay | Admitting: Licensed Clinical Social Worker

## 2016-05-18 NOTE — Telephone Encounter (Signed)
CSW placed call to Ms. Snelgrove to discuss the medical transportation benefit of pt's [employer].  Pt states she was unaware of the benefit but states due to her mobility issues she prefers to have her husband attend her appointments with her so that she is not waiting alone.  Pt agreeable to have CSW mail information on Medicaid medical transportation benefit.

## 2016-05-21 NOTE — Telephone Encounter (Signed)
Doris will contact. OK to sch attending CC.

## 2016-05-22 ENCOUNTER — Encounter: Payer: Medicare Other | Admitting: Internal Medicine

## 2016-06-07 ENCOUNTER — Encounter: Payer: Medicare Other | Admitting: Internal Medicine

## 2016-06-07 DIAGNOSIS — R300 Dysuria: Secondary | ICD-10-CM | POA: Diagnosis not present

## 2016-06-07 DIAGNOSIS — R509 Fever, unspecified: Secondary | ICD-10-CM | POA: Diagnosis not present

## 2016-06-09 ENCOUNTER — Encounter (HOSPITAL_COMMUNITY): Payer: Self-pay | Admitting: Emergency Medicine

## 2016-06-09 ENCOUNTER — Emergency Department (HOSPITAL_COMMUNITY): Payer: Medicare Other

## 2016-06-09 ENCOUNTER — Inpatient Hospital Stay (HOSPITAL_COMMUNITY)
Admission: EM | Admit: 2016-06-09 | Discharge: 2016-06-14 | DRG: 863 | Disposition: A | Discharge status: Home Health | Payer: Medicare Other | Attending: Internal Medicine | Admitting: Internal Medicine

## 2016-06-09 DIAGNOSIS — L02211 Cutaneous abscess of abdominal wall: Secondary | ICD-10-CM | POA: Diagnosis not present

## 2016-06-09 DIAGNOSIS — Z833 Family history of diabetes mellitus: Secondary | ICD-10-CM

## 2016-06-09 DIAGNOSIS — L0291 Cutaneous abscess, unspecified: Secondary | ICD-10-CM

## 2016-06-09 DIAGNOSIS — Z9049 Acquired absence of other specified parts of digestive tract: Secondary | ICD-10-CM

## 2016-06-09 DIAGNOSIS — R509 Fever, unspecified: Secondary | ICD-10-CM | POA: Diagnosis not present

## 2016-06-09 DIAGNOSIS — J45909 Unspecified asthma, uncomplicated: Secondary | ICD-10-CM | POA: Diagnosis present

## 2016-06-09 DIAGNOSIS — B9620 Unspecified Escherichia coli [E. coli] as the cause of diseases classified elsewhere: Secondary | ICD-10-CM | POA: Diagnosis present

## 2016-06-09 DIAGNOSIS — B373 Candidiasis of vulva and vagina: Secondary | ICD-10-CM | POA: Diagnosis not present

## 2016-06-09 DIAGNOSIS — Z6841 Body Mass Index (BMI) 40.0 and over, adult: Secondary | ICD-10-CM | POA: Diagnosis not present

## 2016-06-09 DIAGNOSIS — Z7901 Long term (current) use of anticoagulants: Secondary | ICD-10-CM

## 2016-06-09 DIAGNOSIS — Z7951 Long term (current) use of inhaled steroids: Secondary | ICD-10-CM

## 2016-06-09 DIAGNOSIS — Z90710 Acquired absence of both cervix and uterus: Secondary | ICD-10-CM

## 2016-06-09 DIAGNOSIS — R109 Unspecified abdominal pain: Secondary | ICD-10-CM | POA: Diagnosis not present

## 2016-06-09 DIAGNOSIS — E876 Hypokalemia: Secondary | ICD-10-CM | POA: Diagnosis present

## 2016-06-09 DIAGNOSIS — T814XXA Infection following a procedure, initial encounter: Secondary | ICD-10-CM | POA: Diagnosis not present

## 2016-06-09 DIAGNOSIS — I480 Paroxysmal atrial fibrillation: Secondary | ICD-10-CM | POA: Diagnosis present

## 2016-06-09 DIAGNOSIS — G4730 Sleep apnea, unspecified: Secondary | ICD-10-CM | POA: Diagnosis present

## 2016-06-09 DIAGNOSIS — I10 Essential (primary) hypertension: Secondary | ICD-10-CM | POA: Diagnosis not present

## 2016-06-09 DIAGNOSIS — R7303 Prediabetes: Secondary | ICD-10-CM | POA: Diagnosis present

## 2016-06-09 DIAGNOSIS — E785 Hyperlipidemia, unspecified: Secondary | ICD-10-CM | POA: Diagnosis present

## 2016-06-09 DIAGNOSIS — K219 Gastro-esophageal reflux disease without esophagitis: Secondary | ICD-10-CM | POA: Diagnosis present

## 2016-06-09 DIAGNOSIS — Z87891 Personal history of nicotine dependence: Secondary | ICD-10-CM

## 2016-06-09 DIAGNOSIS — Z8541 Personal history of malignant neoplasm of cervix uteri: Secondary | ICD-10-CM

## 2016-06-09 DIAGNOSIS — Z91013 Allergy to seafood: Secondary | ICD-10-CM

## 2016-06-09 DIAGNOSIS — Z9884 Bariatric surgery status: Secondary | ICD-10-CM

## 2016-06-09 DIAGNOSIS — Z8249 Family history of ischemic heart disease and other diseases of the circulatory system: Secondary | ICD-10-CM

## 2016-06-09 DIAGNOSIS — Z85038 Personal history of other malignant neoplasm of large intestine: Secondary | ICD-10-CM

## 2016-06-09 DIAGNOSIS — Z8349 Family history of other endocrine, nutritional and metabolic diseases: Secondary | ICD-10-CM

## 2016-06-09 DIAGNOSIS — I700 Atherosclerosis of aorta: Secondary | ICD-10-CM | POA: Diagnosis present

## 2016-06-09 DIAGNOSIS — Z9221 Personal history of antineoplastic chemotherapy: Secondary | ICD-10-CM

## 2016-06-09 DIAGNOSIS — R8271 Bacteriuria: Secondary | ICD-10-CM | POA: Diagnosis present

## 2016-06-09 DIAGNOSIS — D509 Iron deficiency anemia, unspecified: Secondary | ICD-10-CM | POA: Diagnosis present

## 2016-06-09 DIAGNOSIS — Y832 Surgical operation with anastomosis, bypass or graft as the cause of abnormal reaction of the patient, or of later complication, without mention of misadventure at the time of the procedure: Secondary | ICD-10-CM | POA: Diagnosis present

## 2016-06-09 DIAGNOSIS — B9689 Other specified bacterial agents as the cause of diseases classified elsewhere: Secondary | ICD-10-CM | POA: Diagnosis present

## 2016-06-09 LAB — CBC WITH DIFFERENTIAL/PLATELET
BASOS ABS: 0 10*3/uL (ref 0.0–0.1)
Basophils Relative: 0 %
EOS PCT: 1 %
Eosinophils Absolute: 0.1 10*3/uL (ref 0.0–0.7)
HCT: 33.3 % — ABNORMAL LOW (ref 36.0–46.0)
Hemoglobin: 10.3 g/dL — ABNORMAL LOW (ref 12.0–15.0)
LYMPHS PCT: 12 %
Lymphs Abs: 1.5 10*3/uL (ref 0.7–4.0)
MCH: 23.5 pg — ABNORMAL LOW (ref 26.0–34.0)
MCHC: 30.9 g/dL (ref 30.0–36.0)
MCV: 76 fL — AB (ref 78.0–100.0)
MONO ABS: 1.2 10*3/uL — AB (ref 0.1–1.0)
Monocytes Relative: 10 %
Neutro Abs: 9.4 10*3/uL — ABNORMAL HIGH (ref 1.7–7.7)
Neutrophils Relative %: 77 %
PLATELETS: 293 10*3/uL (ref 150–400)
RBC: 4.38 MIL/uL (ref 3.87–5.11)
RDW: 16.5 % — AB (ref 11.5–15.5)
WBC: 12.2 10*3/uL — ABNORMAL HIGH (ref 4.0–10.5)

## 2016-06-09 LAB — COMPREHENSIVE METABOLIC PANEL
ALK PHOS: 72 U/L (ref 38–126)
ALT: 11 U/L — AB (ref 14–54)
AST: 19 U/L (ref 15–41)
Albumin: 2.9 g/dL — ABNORMAL LOW (ref 3.5–5.0)
Anion gap: 10 (ref 5–15)
BILIRUBIN TOTAL: 1.1 mg/dL (ref 0.3–1.2)
BUN: 12 mg/dL (ref 6–20)
CO2: 25 mmol/L (ref 22–32)
CREATININE: 0.78 mg/dL (ref 0.44–1.00)
Calcium: 8.5 mg/dL — ABNORMAL LOW (ref 8.9–10.3)
Chloride: 100 mmol/L — ABNORMAL LOW (ref 101–111)
GFR calc Af Amer: >60 mL/min (ref >60)
Glucose, Bld: 125 mg/dL — ABNORMAL HIGH (ref 65–99)
Potassium: 3 mmol/L — ABNORMAL LOW (ref 3.5–5.1)
Sodium: 135 mmol/L (ref 135–145)
TOTAL PROTEIN: 6 g/dL — AB (ref 6.5–8.1)

## 2016-06-09 LAB — URINALYSIS, ROUTINE W REFLEX MICROSCOPIC
Glucose, UA: NEGATIVE mg/dL
HGB URINE DIPSTICK: NEGATIVE
KETONES UR: NEGATIVE mg/dL
Nitrite: NEGATIVE
PROTEIN: NEGATIVE mg/dL
Specific Gravity, Urine: 1.025 (ref 1.005–1.030)
pH: 6.5 (ref 5.0–8.0)

## 2016-06-09 LAB — I-STAT CG4 LACTIC ACID, ED
LACTIC ACID, VENOUS: 1.2 mmol/L (ref 0.5–1.9)
Lactic Acid, Venous: 0.73 mmol/L (ref 0.5–1.9)

## 2016-06-09 LAB — URINE MICROSCOPIC-ADD ON

## 2016-06-09 MED ORDER — IOPAMIDOL (ISOVUE-300) INJECTION 61%
INTRAVENOUS | Status: AC
  Administered 2016-06-09: 100 mL
  Filled 2016-06-09: qty 100

## 2016-06-09 MED ORDER — MORPHINE SULFATE (PF) 4 MG/ML IV SOLN
4.0000 mg | Freq: Once | INTRAVENOUS | Status: AC
Start: 2016-06-09 — End: 2016-06-09
  Administered 2016-06-09: 4 mg via INTRAVENOUS
  Filled 2016-06-09: qty 1

## 2016-06-09 MED ORDER — ACETAMINOPHEN 325 MG PO TABS
650.0000 mg | ORAL_TABLET | Freq: Once | ORAL | Status: AC
  Administered 2016-06-09: 650 mg via ORAL
  Filled 2016-06-09: qty 2

## 2016-06-09 MED ORDER — SODIUM CHLORIDE 0.9 % IV BOLUS (SEPSIS)
1000.0000 mL | Freq: Once | INTRAVENOUS | Status: AC
  Administered 2016-06-09: 1000 mL via INTRAVENOUS

## 2016-06-09 MED ORDER — ONDANSETRON HCL 4 MG/2ML IJ SOLN
4.0000 mg | Freq: Once | INTRAMUSCULAR | Status: AC
  Administered 2016-06-09: 4 mg via INTRAVENOUS
  Filled 2016-06-09: qty 2

## 2016-06-09 MED ORDER — PIPERACILLIN-TAZOBACTAM 3.375 G IVPB 30 MIN
3.3750 g | Freq: Once | INTRAVENOUS | Status: AC
  Administered 2016-06-09: 3.375 g via INTRAVENOUS
  Filled 2016-06-09: qty 50

## 2016-06-09 MED ORDER — VANCOMYCIN HCL IN DEXTROSE 1-5 GM/200ML-% IV SOLN
1000.0000 mg | Freq: Two times a day (BID) | INTRAVENOUS | Status: DC
  Administered 2016-06-10 – 2016-06-12 (×5): 1000 mg via INTRAVENOUS
  Filled 2016-06-09 (×6): qty 200

## 2016-06-09 MED ORDER — VANCOMYCIN HCL 10 G IV SOLR
2000.0000 mg | Freq: Once | INTRAVENOUS | Status: AC
  Administered 2016-06-09: 2000 mg via INTRAVENOUS
  Filled 2016-06-09: qty 2000

## 2016-06-09 NOTE — ED Notes (Signed)
Patient transported to CT 

## 2016-06-09 NOTE — ED Notes (Signed)
Port accessed, 20 gauge port a cath

## 2016-06-09 NOTE — ED Provider Notes (Signed)
[facility] DEPT Provider Note   CSN: [id number] Arrival date & time: 06/09/16  1859     History   Chief Complaint Chief Complaint  Patient presents with  . Abdominal Pain  . Fever  . Post-op Problem    HPI Suzanne Macdonald is a 65 y.o. female.  HPI   1.5wks of low grade temps per pt, tonight 102.4. Last week 100.7. Increasing pain in abdomen. Pain is severe, located on right side of the abdomen, constant.  Tylenol not helping.      Dysuria for 1 week, asper cream with lidocaine on outer part of vagina which helps. Occasional cough. Saw [facility] 2 days ago  Past Medical History:  Diagnosis Date  . Asthma   . Atrial fibrillation ([place])   . Cervical cancer ([place]) 1972  . Colon cancer ([place])   . H/O gastric bypass   . Hyperlipidemia   . Hypertension   . Morbid obesity ([place]) 03/04/2016  . Sleep apnea     Patient Active Problem List   Diagnosis Date Noted  . Abdominal wall abscess 06/10/2016  . Aortic atherosclerosis ([place]) 05/09/2016  . Morbid obesity ([place]) 05/04/2016  . Incarcerated hernia 04/28/2016  . Osteopenia 03/15/2016  . Atrial fibrillation with tachycardic ventricular rate ([place])   . Asthma 03/02/2016  . Prediabetes 01/25/2016  . Essential hypertension 01/25/2016  . Vitamin D deficiency 01/25/2016  . 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 Vitamin B12 deficiency 01/25/2016  . Paroxysmal atrial fibrillation ([place]) 01/25/2016  . Health care maintenance 01/25/2016  . History of colon cancer 01/25/2016  . Ventral hernia 01/25/2016  . History of cervical cancer 01/25/2016    Past Surgical History:  Procedure Laterality Date  . ABDOMINAL HYSTERECTOMY  1998   Salpingoophorectomy. For tumorous growth.  . BOWEL RESECTION  2015   [place], [place] by Dr. Stormy Fabian  . GASTRIC BYPASS  1992  . INSERTION OF MESH N/A 05/01/2016   Procedure: INSERTION OF MESH;  Surgeon: Excell Seltzer, MD;  Location: [facility];  Service: General;  Laterality: N/A;  . LAPAROSCOPIC GASTRIC  BANDING     Placed 2013 and removed in 2014.  Marland Kitchen VENTRAL HERNIA REPAIR N/A 05/01/2016   Procedure: VENTRAL HERNIA REPAIR;  Surgeon: Excell Seltzer, MD;  Location: [facility];  Service: General;  Laterality: N/A;    OB History    No data available       Home Medications    Prior to Admission medications   Medication Sig Start Date End Date Taking? Authorizing Provider  acetaminophen (TYLENOL) 500 MG tablet Take 1,000 mg by mouth every 6 (six) hours as needed for mild pain or moderate pain.   Yes Historical Provider, MD  acetaminophen-codeine (TYLENOL #3) 300-30 MG tablet Take 1-2 tablets by mouth every 4 (four) hours as needed for moderate pain or severe pain. 05/06/16  Yes Bethany Molt, DO  albuterol (PROVENTIL HFA;VENTOLIN HFA) 108 (90 Base) MCG/ACT inhaler Inhale 1-2 puffs into the lungs every 6 (six) hours as needed for wheezing or shortness of breath.   Yes Historical Provider, MD  apixaban (ELIQUIS) 5 MG TABS tablet Take 1 tablet (5 mg total) by mouth 2 (two) times daily. 03/13/16  Yes Shela Leff, MD  atorvastatin (LIPITOR) 10 MG tablet take 1 tablet by mouth once daily 04/04/16  Yes Shela Leff, MD  budesonide-formoterol [facility]) 160-4.5 MCG/ACT inhaler Inhale 2 puffs into the lungs 2 (two) times daily. 01/24/16  Yes Milagros Loll, MD  Calcium-Phosphorus-Vitamin D (CALCIUM/VITAMIN D3/ADULT GUMMY PO) Take 2  each by mouth daily.   Yes Historical Provider, MD  carvedilol (COREG) 6.25 MG tablet Take 1.5 tablets (9.375 mg total) by mouth 2 (two) times daily. 04/27/16  Yes [name], MD  cholecalciferol (VITAMIN D) 1000 units tablet Take 2,000 Units by mouth daily.    Yes Historical Provider, MD  cyanocobalamin 500 MCG tablet Take 1,000 mcg by mouth daily.    Yes Historical Provider, MD  flecainide (TAMBOCOR) 50 MG tablet Take 1 tablet (50 mg total) by mouth 2 (two) times daily. 04/27/16  Yes [name], MD  hydrochlorothiazide (HYDRODIURIL) 25 MG tablet Take 0.5 tablets  (12.5 mg total) by mouth daily. 04/27/16  Yes [name], MD  Multiple Vitamins-Minerals (ALIVE WOMENS GUMMY) CHEW Chew 2 each by mouth daily.   Yes Historical Provider, MD  omeprazole (PRILOSEC) 40 MG capsule Take 1 capsule (40 mg total) by mouth daily. 04/27/16  Yes [name], MD  polyethylene glycol (MIRALAX / GLYCOLAX) packet Take 17 g by mouth daily. 05/06/16  Yes Bethany Molt, DO  senna-docusate (SENOKOT-S) 8.6-50 MG tablet Take 1 tablet by mouth at bedtime as needed for mild constipation. 05/06/16  Yes Bethany Molt, DO  traZODone (DESYREL) 100 MG tablet Take 100 mg by mouth at bedtime. 04/22/16  Yes Historical Provider, MD  ondansetron (ZOFRAN-ODT) 8 MG disintegrating tablet Take 1 tablet (8 mg total) by mouth every 8 (eight) hours as needed for nausea or vomiting. Patient not taking: Reported on 06/09/2016 01/22/16   Davonna Belling, MD    Family History Family History  Problem Relation Age of Onset  . Heart disease Mother   . Diabetes Mother   . Heart disease Father   . Stroke Father   . Diabetes Father   . Diabetes Sister   . Thyroid disease Sister   . Early death Brother     Killed by drunk driver  . Heart attack Brother   . Thyroid disease Daughter     s/p thyroidectomy  . Diabetes Sister   . Thyroid disease Sister   . Stroke Brother   . Drug abuse Brother   . Heart attack Brother     Social History Social History  Substance Use Topics  . Smoking status: Former Smoker    Years: 0.20    Types: Cigarettes  . Smokeless tobacco: Never Used  . Alcohol use No     Comment: a beer every now and then     Allergies   Scallops [shellfish allergy]   Review of Systems Review of Systems  Constitutional: Positive for fever.  HENT: Negative for sore throat.   Eyes: Negative for visual disturbance.  Respiratory: Negative for cough (every now and then "dryness in the air") and shortness of breath.   Cardiovascular: Negative for chest pain and leg swelling.    Gastrointestinal: Positive for abdominal pain and nausea. Negative for constipation, diarrhea and vomiting.  Genitourinary: Positive for dysuria. Negative for difficulty urinating.  Musculoskeletal: Negative for back pain and neck pain.  Skin: Negative for rash.  Neurological: Negative for syncope and headaches.     Physical Exam Updated Vital Signs BP (!) 134/47 (BP Location: Left Arm)   Pulse 73   Temp 99.9 F (37.7 C) (Oral)   Resp 19   Ht '5\' 2"'$  (1.575 m)   Wt 285 lb 4.8 oz (129.4 kg)   SpO2 98%   BMI 52.18 kg/m   Physical Exam  Constitutional: She is oriented to person, place, and time. She appears well-developed and well-nourished.  No distress.  HENT:  Head: Normocephalic and atraumatic.  Eyes: Conjunctivae and EOM are normal.  Neck: Normal range of motion.  Cardiovascular: Normal rate, regular rhythm, normal heart sounds and intact distal pulses.  Exam reveals no gallop and no friction rub.   No murmur heard. Pulmonary/Chest: Effort normal and breath sounds normal. No respiratory distress. She has no wheezes. She has no rales.  Abdominal: Soft. She exhibits no distension. There is tenderness (right sided). There is guarding.  Musculoskeletal: She exhibits no edema or tenderness.  Neurological: She is alert and oriented to person, place, and time.  Skin: Skin is warm and dry. No rash noted. She is not diaphoretic. No erythema.  Nursing note and vitals reviewed.    ED Treatments / Results  Labs (all labs ordered are listed, but only abnormal results are displayed) Labs Reviewed  COMPREHENSIVE METABOLIC PANEL - Abnormal; Notable for the following:       Result Value   Potassium 3.0 (*)    Chloride 100 (*)    Glucose, Bld 125 (*)    Calcium 8.5 (*)    Total Protein 6.0 (*)    Albumin 2.9 (*)    ALT 11 (*)    All other components within normal limits  CBC WITH DIFFERENTIAL/PLATELET - Abnormal; Notable for the following:    WBC 12.2 (*)    Hemoglobin 10.3 (*)     HCT 33.3 (*)    MCV 76.0 (*)    MCH 23.5 (*)    RDW 16.5 (*)    Neutro Abs 9.4 (*)    Monocytes Absolute 1.2 (*)    All other components within normal limits  URINALYSIS, ROUTINE W REFLEX MICROSCOPIC (NOT AT [facility]) - Abnormal; Notable for the following:    APPearance CLOUDY (*)    Bilirubin Urine SMALL (*)    Leukocytes, UA TRACE (*)    All other components within normal limits  URINE MICROSCOPIC-ADD ON - Abnormal; Notable for the following:    Squamous Epithelial / LPF 0-5 (*)    Bacteria, UA MANY (*)    All other components within normal limits  CULTURE, BLOOD (ROUTINE X 2)  CULTURE, BLOOD (ROUTINE X 2)  URINE CULTURE  I-STAT CG4 LACTIC ACID, ED  I-STAT CG4 LACTIC ACID, ED    EKG  EKG Interpretation None       Radiology Dg Chest 2 View  Result Date: 06/09/2016 CLINICAL DATA:  Abdominal pain and fever. Hernia surgery 2 weeks ago. EXAM: CHEST  2 VIEW COMPARISON:  03/02/2016 FINDINGS: Right subclavian Port-A-Cath remains in place with tip overlying the lower SVC. The cardiomediastinal silhouette is within normal limits. There is no evidence of airspace consolidation, edema, pleural effusion, or pneumothorax. Thoracic spondylosis is noted. IMPRESSION: No active cardiopulmonary disease. Electronically Signed   By: Logan Bores M.D.   On: 06/09/2016 21:38   Ct Abdomen Pelvis W Contrast  Result Date: 06/09/2016 CLINICAL DATA:  65 year old female with abdominal pain and fever. History of hernia surgery 2 weeks ago. EXAM: CT ABDOMEN AND PELVIS WITH CONTRAST TECHNIQUE: Multidetector CT imaging of the abdomen and pelvis was performed using the standard protocol following bolus administration of intravenous contrast. CONTRAST:  164m ISOVUE-300 IOPAMIDOL (ISOVUE-300) INJECTION 61% COMPARISON:  Abdominal CT dated 05/09/2016 FINDINGS: Lower chest: The visualized lung bases are clear. No intra-abdominal free air or free fluid. Hepatobiliary: Diffuse fatty infiltration of the liver. No  intrahepatic biliary ductal dilatation. The gallbladder is unremarkable. Pancreas: Unremarkable. No pancreatic ductal dilatation or  surrounding inflammatory changes. Spleen: Normal in size without focal abnormality. Adrenals/Urinary Tract: There is a 1.5 cm left adrenal hypodense nodule measuring less than 10 Hounsfield units most compatible with an adenoma. The right adrenal gland appears unremarkable. The kidneys, visualized ureters, and urinary bladder appear unremarkable. Stomach/Bowel: There postsurgical changes of gastric bypass with multiple anastomotic sutures in the small bowel. There is also partial resection of the sigmoid colon. No evidence of bowel obstruction or active inflammation. Normal appendix. Vascular/Lymphatic: There is mild moderate aortoiliac atherosclerotic disease. The abdominal aorta and IVC are otherwise unremarkable. No portal venous gas identified. There is no adenopathy. Reproductive: Hysterectomy. Other: There is a midline vertical anterior pelvic wall incisional scar. Postsurgical changes of ventral hernia repair noted. There is a large fluid collection in the anterior abdominal wall along measuring approximately 21 x 5 cm in greatest axial dimension and 22 cm in the craniocaudal length similar to the prior CT. There may be minimal enhancement of the adjacent capsule concerning for an infected collection. A 5.0 x 7.2 cm collection noted containing air-fluid level in the right anterior abdominal wall lateral to the larger collection. This collection appears to be more superficial and likely in the subcutaneous tissues and superficial to the external oblique musculature. This collection is new since the prior CT. There is an apparent tract like communication extending from this collection to the deeper soft tissues and through the abdominal fascia and musculature and abutting the small bowel anastomosis in the right anterior abdominal wall. There is also apparent communication with the  larger collection in the anterior abdominal wall. Although the smaller collection in the lateral right anterior abdominal wall may be extension of the larger collection in the midline, dehiscence of in the adjacent small bowel anastomosis is not entirely excluded. Further evaluation with CT with oral contrast is recommended. There is diffuse stranding of the subcutaneous soft tissues of the anterior abdominal wall. Musculoskeletal: Multilevel degenerative changes of the spine. No acute fracture. IMPRESSION: Persistent large loculated fluid in the anterior abdominal wall at the site of hernia repair concerning for infected fluid. There has been interval development of a smaller collection with air and fluid level in the right anterior abdominal wall laterally which appears to communicate with the larger collection in the midline anterior abdomen. A tract like communication also extends from the smaller collection in the right lateral anterior abdominal wall and abuts the adjacent small bowel anastomosis. Dehiscence of the anastomotic suture as the cause of fluid collection is not entirely excluded. Further evaluation with CT with oral contrast is recommended. Electronically Signed   By: Anner Crete M.D.   On: 06/09/2016 23:29    Procedures Procedures (including critical care time)  Medications Ordered in ED Medications  vancomycin (VANCOCIN) 2,000 mg in sodium chloride 0.9 % 500 mL IVPB (0 mg Intravenous Stopped 06/10/16 0058)    Followed by  vancomycin (VANCOCIN) IVPB 1000 mg/200 mL premix (not administered)  potassium chloride SA (K-DUR,KLOR-CON) CR tablet 40 mEq (not administered)  morphine 4 MG/ML injection 4 mg (4 mg Intravenous Given 06/09/16 2127)  ondansetron (ZOFRAN) injection 4 mg (4 mg Intravenous Given 06/09/16 2125)  sodium chloride 0.9 % bolus 1,000 mL (0 mLs Intravenous Stopped 06/09/16 2254)  piperacillin-tazobactam (ZOSYN) IVPB 3.375 g (0 g Intravenous Stopped 06/09/16 2154)    iopamidol (ISOVUE-300) 61 % injection (100 mLs  Contrast Given 06/09/16 2204)  acetaminophen (TYLENOL) tablet 650 mg (650 mg Oral Given 06/09/16 2235)  morphine 4 MG/ML injection 4 mg (  4 mg Intravenous Given 06/10/16 0138)  ondansetron (ZOFRAN) injection 4 mg (4 mg Intravenous Given 06/10/16 0138)     Initial Impression / Assessment and Plan / ED Course  I have reviewed the triage vital signs and the nursing notes.  Pertinent labs & imaging results that were available during my care of the patient were reviewed by me and considered in my medical decision making (see chart for details).  Clinical Course   65yo female with history of incarcerated hernia repair with Dr. Excell Seltzer in September, Atrial fibrillation, hypertension presents with concern for fever up to 102.5 at home and increasing abdominal pain. CT abdomen and pelvis completed which shows an anterior abdominal wall area of loculated fluid, as well as a smaller collection in the right anterior abdominal wall which appears to communicate with this, and in setting of fever, and increasing abdominal pain, have concern that this is abscess.  Discussed with Dr. Donne Hazel her Gen. surgery, and feel patient is appropriate for admission to internal medicine with interventional radiology consult in the morning.  Patient hemodynamically stable.  Given abdominal exam, vital signs, feel surgical evaluation tonight or in morning is appropriate.   Pt given vanc/zosyn, morphine/zofran. Admitted to internal medicine for further care.   Final Clinical Impressions(s) / ED Diagnoses   Final diagnoses:  Abdominal wall abscess  Fever, unspecified fever cause    New Prescriptions Current Discharge Medication List       Gareth Morgan, MD 06/10/16 [phone]

## 2016-06-09 NOTE — ED Triage Notes (Signed)
Patient arrives with complaint of right mid abdominal pain. Post op 2 weeks from multiple hernia repair. Patient states she had a mild fever at the time of surgery, but they continued on with the repair. Since that time she has had pain in her right abdomen. Currently points to an area near a lap site which she reports is painful. No obvious induration, swelling, redness, or drainage. Site appears well healed. Patient endorses increase in pain intensity over past 2 days. Also endorses nausea, but no vomiting. Denies diarrhea. Endorses worsening fever up to 102.4 at home.

## 2016-06-09 NOTE — Progress Notes (Signed)
Pharmacy Antibiotic Note  Suzanne Macdonald is a 65 y.o. female admitted on 06/09/2016 with wound infection.  Pharmacy has been consulted for vancomycin dosing.  Pt received vancomycin 2g IV once in the ED.  Plan: Vancomycin 1g IV every 12 hours.  Goal trough 10-15 mcg/mL.  Monitor culture data, renal function and clinical course VT at SS prn  Height: '5\' 2"'$  (157.5 cm) Weight: 283 lb (128.4 kg) IBW/kg (Calculated) : 50.1  Temp (24hrs), Avg:100.7 F (38.2 C), Min:100.7 F (38.2 C), Max:100.7 F (38.2 C)   Recent Labs Lab 06/09/16 1928 06/09/16 1942  WBC 12.2*  --   CREATININE 0.78  --   LATICACIDVEN  --  1.20    Estimated Creatinine Clearance: 90.1 mL/min (by C-G formula based on SCr of 0.78 mg/dL).    Allergies  Allergen Reactions  . Iodine Hives    Antimicrobials this admission: Vanc 10/21 >>   Dose adjustments this admission: n/a  Microbiology results: 10/21 BCx: sent 10/21 UCx: sent   Sputum:    MRSA PCR:    Andrey Cota. Diona Foley, PharmD, [place] Clinical Pharmacist Pager [phone] 06/09/2016 8:42 PM

## 2016-06-09 NOTE — ED Notes (Signed)
Pt brought back to room appears in no distress placed on monitor family at bedside

## 2016-06-10 ENCOUNTER — Encounter (HOSPITAL_COMMUNITY): Payer: Self-pay | Admitting: General Surgery

## 2016-06-10 DIAGNOSIS — E876 Hypokalemia: Secondary | ICD-10-CM | POA: Diagnosis present

## 2016-06-10 DIAGNOSIS — Z6841 Body Mass Index (BMI) 40.0 and over, adult: Secondary | ICD-10-CM | POA: Diagnosis not present

## 2016-06-10 DIAGNOSIS — J45909 Unspecified asthma, uncomplicated: Secondary | ICD-10-CM | POA: Diagnosis not present

## 2016-06-10 DIAGNOSIS — I4891 Unspecified atrial fibrillation: Secondary | ICD-10-CM

## 2016-06-10 DIAGNOSIS — Z87891 Personal history of nicotine dependence: Secondary | ICD-10-CM | POA: Diagnosis not present

## 2016-06-10 DIAGNOSIS — Z9884 Bariatric surgery status: Secondary | ICD-10-CM | POA: Diagnosis not present

## 2016-06-10 DIAGNOSIS — Z8541 Personal history of malignant neoplasm of cervix uteri: Secondary | ICD-10-CM | POA: Diagnosis not present

## 2016-06-10 DIAGNOSIS — I700 Atherosclerosis of aorta: Secondary | ICD-10-CM | POA: Diagnosis not present

## 2016-06-10 DIAGNOSIS — B9620 Unspecified Escherichia coli [E. coli] as the cause of diseases classified elsewhere: Secondary | ICD-10-CM | POA: Diagnosis not present

## 2016-06-10 DIAGNOSIS — Z7901 Long term (current) use of anticoagulants: Secondary | ICD-10-CM | POA: Diagnosis not present

## 2016-06-10 DIAGNOSIS — K219 Gastro-esophageal reflux disease without esophagitis: Secondary | ICD-10-CM

## 2016-06-10 DIAGNOSIS — E785 Hyperlipidemia, unspecified: Secondary | ICD-10-CM

## 2016-06-10 DIAGNOSIS — R1900 Intra-abdominal and pelvic swelling, mass and lump, unspecified site: Secondary | ICD-10-CM | POA: Diagnosis not present

## 2016-06-10 DIAGNOSIS — G4730 Sleep apnea, unspecified: Secondary | ICD-10-CM | POA: Diagnosis not present

## 2016-06-10 DIAGNOSIS — D509 Iron deficiency anemia, unspecified: Secondary | ICD-10-CM | POA: Diagnosis present

## 2016-06-10 DIAGNOSIS — B373 Candidiasis of vulva and vagina: Secondary | ICD-10-CM | POA: Diagnosis not present

## 2016-06-10 DIAGNOSIS — I10 Essential (primary) hypertension: Secondary | ICD-10-CM | POA: Diagnosis not present

## 2016-06-10 DIAGNOSIS — I480 Paroxysmal atrial fibrillation: Secondary | ICD-10-CM

## 2016-06-10 DIAGNOSIS — Z79899 Other long term (current) drug therapy: Secondary | ICD-10-CM

## 2016-06-10 DIAGNOSIS — L02211 Cutaneous abscess of abdominal wall: Secondary | ICD-10-CM | POA: Diagnosis not present

## 2016-06-10 DIAGNOSIS — Z85038 Personal history of other malignant neoplasm of large intestine: Secondary | ICD-10-CM | POA: Diagnosis not present

## 2016-06-10 DIAGNOSIS — B9689 Other specified bacterial agents as the cause of diseases classified elsewhere: Secondary | ICD-10-CM | POA: Diagnosis not present

## 2016-06-10 DIAGNOSIS — Z7951 Long term (current) use of inhaled steroids: Secondary | ICD-10-CM | POA: Diagnosis not present

## 2016-06-10 DIAGNOSIS — T814XXA Infection following a procedure, initial encounter: Secondary | ICD-10-CM | POA: Diagnosis not present

## 2016-06-10 DIAGNOSIS — R7303 Prediabetes: Secondary | ICD-10-CM | POA: Diagnosis not present

## 2016-06-10 DIAGNOSIS — Z91013 Allergy to seafood: Secondary | ICD-10-CM | POA: Diagnosis not present

## 2016-06-10 DIAGNOSIS — R8271 Bacteriuria: Secondary | ICD-10-CM | POA: Diagnosis present

## 2016-06-10 DIAGNOSIS — Y832 Surgical operation with anastomosis, bypass or graft as the cause of abnormal reaction of the patient, or of later complication, without mention of misadventure at the time of the procedure: Secondary | ICD-10-CM | POA: Diagnosis present

## 2016-06-10 DIAGNOSIS — N390 Urinary tract infection, site not specified: Secondary | ICD-10-CM | POA: Diagnosis not present

## 2016-06-10 DIAGNOSIS — Z8249 Family history of ischemic heart disease and other diseases of the circulatory system: Secondary | ICD-10-CM | POA: Diagnosis not present

## 2016-06-10 MED ORDER — TRAZODONE HCL 100 MG PO TABS
100.0000 mg | ORAL_TABLET | Freq: Every day | ORAL | Status: DC
  Administered 2016-06-10 – 2016-06-13 (×4): 100 mg via ORAL
  Filled 2016-06-10 (×4): qty 1

## 2016-06-10 MED ORDER — HYDROCHLOROTHIAZIDE 25 MG PO TABS
12.5000 mg | ORAL_TABLET | Freq: Every day | ORAL | Status: DC
  Administered 2016-06-10 – 2016-06-11 (×2): 12.5 mg via ORAL
  Filled 2016-06-10 (×2): qty 1

## 2016-06-10 MED ORDER — FLECAINIDE ACETATE 50 MG PO TABS
50.0000 mg | ORAL_TABLET | Freq: Once | ORAL | Status: AC
  Administered 2016-06-10: 50 mg via ORAL
  Filled 2016-06-10: qty 1

## 2016-06-10 MED ORDER — FLECAINIDE ACETATE 100 MG PO TABS
100.0000 mg | ORAL_TABLET | Freq: Two times a day (BID) | ORAL | Status: DC
  Administered 2016-06-10 – 2016-06-14 (×8): 100 mg via ORAL
  Filled 2016-06-10 (×9): qty 1

## 2016-06-10 MED ORDER — CARVEDILOL 6.25 MG PO TABS
9.3750 mg | ORAL_TABLET | Freq: Two times a day (BID) | ORAL | Status: DC
  Administered 2016-06-10 – 2016-06-14 (×10): 9.375 mg via ORAL
  Filled 2016-06-10 (×11): qty 1

## 2016-06-10 MED ORDER — ENOXAPARIN SODIUM 150 MG/ML ~~LOC~~ SOLN
130.0000 mg | Freq: Two times a day (BID) | SUBCUTANEOUS | Status: DC
  Administered 2016-06-11 – 2016-06-14 (×6): 130 mg via SUBCUTANEOUS
  Filled 2016-06-10 (×7): qty 0.87

## 2016-06-10 MED ORDER — PIPERACILLIN-TAZOBACTAM 3.375 G IVPB
3.3750 g | Freq: Three times a day (TID) | INTRAVENOUS | Status: DC
  Administered 2016-06-10 – 2016-06-12 (×8): 3.375 g via INTRAVENOUS
  Filled 2016-06-10 (×10): qty 50

## 2016-06-10 MED ORDER — HEPARIN SODIUM (PORCINE) 5000 UNIT/ML IJ SOLN: 5000.0000 [IU] | Freq: Three times a day (TID) | INTRAMUSCULAR | Status: DC

## 2016-06-10 MED ORDER — ACETAMINOPHEN 650 MG RE SUPP: 650.0000 mg | Freq: Four times a day (QID) | RECTAL | Status: DC | PRN

## 2016-06-10 MED ORDER — ONDANSETRON HCL 4 MG/2ML IJ SOLN
4.0000 mg | Freq: Once | INTRAMUSCULAR | Status: AC
  Administered 2016-06-10: 4 mg via INTRAVENOUS
  Filled 2016-06-10: qty 2

## 2016-06-10 MED ORDER — MORPHINE SULFATE (PF) 4 MG/ML IV SOLN
4.0000 mg | Freq: Once | INTRAVENOUS | Status: AC
  Administered 2016-06-10: 4 mg via INTRAVENOUS
  Filled 2016-06-10: qty 1

## 2016-06-10 MED ORDER — DILTIAZEM HCL 100 MG IV SOLR
5.0000 mg/h | INTRAVENOUS | Status: DC
  Filled 2016-06-10: qty 100

## 2016-06-10 MED ORDER — HEPARIN SODIUM (PORCINE) 5000 UNIT/ML IJ SOLN
5000.0000 [IU] | Freq: Three times a day (TID) | INTRAMUSCULAR | Status: DC
  Administered 2016-06-10: 5000 [IU] via SUBCUTANEOUS
  Filled 2016-06-10: qty 1

## 2016-06-10 MED ORDER — ATORVASTATIN CALCIUM 10 MG PO TABS
10.0000 mg | ORAL_TABLET | Freq: Every day | ORAL | Status: DC
  Administered 2016-06-10 – 2016-06-14 (×5): 10 mg via ORAL
  Filled 2016-06-10 (×5): qty 1

## 2016-06-10 MED ORDER — SENNOSIDES-DOCUSATE SODIUM 8.6-50 MG PO TABS: 1.0000 | ORAL_TABLET | Freq: Every evening | ORAL | Status: DC | PRN

## 2016-06-10 MED ORDER — DILTIAZEM HCL 60 MG PO TABS
30.0000 mg | ORAL_TABLET | Freq: Four times a day (QID) | ORAL | Status: DC
  Administered 2016-06-10 – 2016-06-11 (×3): 30 mg via ORAL
  Filled 2016-06-10 (×3): qty 1

## 2016-06-10 MED ORDER — ALBUTEROL SULFATE (2.5 MG/3ML) 0.083% IN NEBU: 2.5000 mg | INHALATION_SOLUTION | Freq: Four times a day (QID) | RESPIRATORY_TRACT | Status: DC | PRN

## 2016-06-10 MED ORDER — FLECAINIDE ACETATE 50 MG PO TABS
50.0000 mg | ORAL_TABLET | Freq: Two times a day (BID) | ORAL | Status: DC
  Administered 2016-06-10: 50 mg via ORAL
  Filled 2016-06-10: qty 1

## 2016-06-10 MED ORDER — PANTOPRAZOLE SODIUM 40 MG PO TBEC
40.0000 mg | DELAYED_RELEASE_TABLET | Freq: Every day | ORAL | Status: DC
  Administered 2016-06-10 – 2016-06-14 (×5): 40 mg via ORAL
  Filled 2016-06-10 (×5): qty 1

## 2016-06-10 MED ORDER — FLUCONAZOLE 100 MG PO TABS
150.0000 mg | ORAL_TABLET | Freq: Once | ORAL | Status: AC
  Administered 2016-06-10: 150 mg via ORAL
  Filled 2016-06-10: qty 2

## 2016-06-10 MED ORDER — ONDANSETRON HCL 4 MG PO TABS: 4.0000 mg | ORAL_TABLET | Freq: Four times a day (QID) | ORAL | Status: DC | PRN

## 2016-06-10 MED ORDER — ONDANSETRON HCL 4 MG/2ML IJ SOLN
4.0000 mg | Freq: Four times a day (QID) | INTRAMUSCULAR | Status: DC | PRN
  Filled 2016-06-10: qty 2

## 2016-06-10 MED ORDER — MORPHINE SULFATE (PF) 2 MG/ML IV SOLN
2.0000 mg | INTRAVENOUS | Status: DC | PRN
  Administered 2016-06-10 – 2016-06-14 (×16): 2 mg via INTRAVENOUS
  Filled 2016-06-10 (×17): qty 1

## 2016-06-10 MED ORDER — DILTIAZEM LOAD VIA INFUSION
10.0000 mg | Freq: Once | INTRAVENOUS | Status: AC
  Administered 2016-06-10: 10 mg via INTRAVENOUS
  Filled 2016-06-10: qty 10

## 2016-06-10 MED ORDER — POTASSIUM CHLORIDE CRYS ER 20 MEQ PO TBCR
40.0000 meq | EXTENDED_RELEASE_TABLET | Freq: Two times a day (BID) | ORAL | Status: AC
  Administered 2016-06-10 (×2): 40 meq via ORAL
  Filled 2016-06-10 (×2): qty 2

## 2016-06-10 MED ORDER — POLYETHYLENE GLYCOL 3350 17 G PO PACK
17.0000 g | PACK | Freq: Every day | ORAL | Status: DC
  Administered 2016-06-10 – 2016-06-12 (×2): 17 g via ORAL
  Filled 2016-06-10 (×4): qty 1

## 2016-06-10 MED ORDER — MOMETASONE FURO-FORMOTEROL FUM 200-5 MCG/ACT IN AERO
2.0000 | INHALATION_SPRAY | Freq: Two times a day (BID) | RESPIRATORY_TRACT | Status: DC
  Administered 2016-06-10 – 2016-06-14 (×9): 2 via RESPIRATORY_TRACT
  Filled 2016-06-10: qty 8.8

## 2016-06-10 MED ORDER — ENOXAPARIN SODIUM 150 MG/ML ~~LOC~~ SOLN
130.0000 mg | Freq: Two times a day (BID) | SUBCUTANEOUS | Status: DC
  Administered 2016-06-10: 130 mg via SUBCUTANEOUS
  Filled 2016-06-10: qty 0.87

## 2016-06-10 MED ORDER — ACETAMINOPHEN 325 MG PO TABS
650.0000 mg | ORAL_TABLET | Freq: Four times a day (QID) | ORAL | Status: DC | PRN
  Administered 2016-06-10 – 2016-06-14 (×2): 650 mg via ORAL
  Filled 2016-06-10 (×2): qty 2

## 2016-06-10 NOTE — Progress Notes (Signed)
Pharmacy Antibiotic Note  Suzanne Macdonald is a 65 y.o. female admitted on 06/09/2016 with abd wall abscess.  Pharmacy consulted earlier for vancomycin dosing. Now consulted to add Zosyn. Consulting IR for draininage today.  Zosyn 3.375gm IV given in ED ~2130  Plan: Zosyn 3.375gm IV q8h - each dose over 4 hours Continue Vancomycin 1gm IV q12h Monitor culture data, renal function and clinical course VT at SS prn  Height: '5\' 2"'$  (157.5 cm) Weight: 285 lb 4.8 oz (129.4 kg) IBW/kg (Calculated) : 50.1  Temp (24hrs), Avg:101.1 F (38.4 C), Min:99.9 F (37.7 C), Max:102.7 F (39.3 C)   Recent Labs Lab 06/09/16 1928 06/09/16 1942 06/09/16 2311  WBC 12.2*  --   --   CREATININE 0.78  --   --   LATICACIDVEN  --  1.20 0.73    Estimated Creatinine Clearance: 90.5 mL/min (by C-G formula based on SCr of 0.78 mg/dL).    Allergies  Allergen Reactions  . Scallops [Shellfish Allergy] Hives    Antimicrobials this admission: Vanc 10/21 >>  Zosyn 10/21 >>  Dose adjustments this admission: n/a  Microbiology results: 10/21 BCx x2:  10/21 UCx:   Sherlon Handing, PharmD, BCPS Clinical pharmacist, pager [phone] 06/10/2016 2:57 AM

## 2016-06-10 NOTE — Consult Note (Addendum)
Reason for Consult possible abscess Referring Physician: Dr Gareth Morgan  Suzanne Macdonald is an 65 y.o. female.  HPI: 67 yof with multiple cormorbidities required repair of incarcerated vh on 9/12.this was done with mesh, TAR that had an enterotomy repaired in rlq.  She has not felt well for last week with nausea. She is having bowel function.  She has had fevers.  She had ct on 9/20 with fluid as you would likely expect but now has new collection as well as the other fluid present. The new collection in rlq is worrisome given air in pocket  Past Medical History:  Diagnosis Date  . Asthma   . Atrial fibrillation ([place])   . Cervical cancer ([name]) 1972  . Colon cancer ([place])   . H/O gastric bypass   . Hyperlipidemia   . Hypertension   . Morbid obesity ([name]) 03/04/2016  . Sleep apnea     Past Surgical History:  Procedure Laterality Date  . ABDOMINAL HYSTERECTOMY  1998   Salpingoophorectomy. For tumorous growth.  . BOWEL RESECTION  2015   [place], [place] by Dr. Stormy Fabian  . GASTRIC BYPASS  1992  . INSERTION OF MESH N/A 05/01/2016   Procedure: INSERTION OF MESH;  Surgeon: Excell Seltzer, MD;  Location: [facility];  Service: General;  Laterality: N/A;  . LAPAROSCOPIC GASTRIC BANDING     Placed 2013 and removed in 2014.  Marland Kitchen VENTRAL HERNIA REPAIR N/A 05/01/2016   Procedure: VENTRAL HERNIA REPAIR;  Surgeon: Excell Seltzer, MD;  Location: [facility];  Service: General;  Laterality: N/A;    Family History  Problem Relation Age of Onset  . Heart disease Mother   . Diabetes Mother   . Heart disease Father   . Stroke Father   . Diabetes Father   . Diabetes Sister   . Thyroid disease Sister   . Early death Brother     Killed by drunk driver  . Heart attack Brother   . Thyroid disease Daughter     s/p thyroidectomy  . Diabetes Sister   . Thyroid disease Sister   . Stroke Brother   . Drug abuse Brother   . Heart attack Brother     Social History:  reports that she has quit smoking. Her  smoking use included Cigarettes. She quit after 0.20 years of use. She has never used smokeless tobacco. She reports that she does not drink alcohol or use drugs.  Allergies:  Allergies  Allergen Reactions  . Scallops [Shellfish Allergy] Hives    Medications: reviewed, she states she is on eliquis and took this last night as scheduled  Results for orders placed or performed during the hospital encounter of 06/09/16 (from the past 48 hour(s))  Urinalysis, Routine w reflex microscopic     Status: Abnormal   Collection Time: 06/09/16  7:18 PM  Result Value Ref Range   Color, Urine YELLOW YELLOW   APPearance CLOUDY (A) CLEAR   Specific Gravity, Urine 1.025 1.005 - 1.030   pH 6.5 5.0 - 8.0   Glucose, UA NEGATIVE NEGATIVE mg/dL   Hgb urine dipstick NEGATIVE NEGATIVE   Bilirubin Urine SMALL (A) NEGATIVE   Ketones, ur NEGATIVE NEGATIVE mg/dL   Protein, ur NEGATIVE NEGATIVE mg/dL   Nitrite NEGATIVE NEGATIVE   Leukocytes, UA TRACE (A) NEGATIVE  Urine microscopic-add on     Status: Abnormal   Collection Time: 06/09/16  7:18 PM  Result Value Ref Range   Squamous Epithelial / LPF 0-5 (A) NONE SEEN  WBC, UA 0-5 0 - 5 WBC/hpf   RBC / HPF 0-5 0 - 5 RBC/hpf   Bacteria, UA MANY (A) NONE SEEN   Urine-Other MUCOUS PRESENT   Comprehensive metabolic panel     Status: Abnormal   Collection Time: 06/09/16  7:28 PM  Result Value Ref Range   Sodium 135 135 - 145 mmol/L   Potassium 3.0 (L) 3.5 - 5.1 mmol/L   Chloride 100 (L) 101 - 111 mmol/L   CO2 25 22 - 32 mmol/L   Glucose, Bld 125 (H) 65 - 99 mg/dL   BUN 12 6 - 20 mg/dL   Creatinine, Ser 0.78 0.44 - 1.00 mg/dL   Calcium 8.5 (L) 8.9 - 10.3 mg/dL   Total Protein 6.0 (L) 6.5 - 8.1 g/dL   Albumin 2.9 (L) 3.5 - 5.0 g/dL   AST 19 15 - 41 U/L   ALT 11 (L) 14 - 54 U/L   Alkaline Phosphatase 72 38 - 126 U/L   Total Bilirubin 1.1 0.3 - 1.2 mg/dL   GFR calc non Af Amer >60 >60 mL/min   GFR calc Af Amer >60 >60 mL/min    Comment: (NOTE) The eGFR  has been calculated using the CKD EPI equation. This calculation has not been validated in all clinical situations. eGFR's persistently <60 mL/min signify possible Chronic Kidney Disease.    Anion gap 10 5 - 15  CBC with Differential     Status: Abnormal   Collection Time: 06/09/16  7:28 PM  Result Value Ref Range   WBC 12.2 (H) 4.0 - 10.5 K/uL   RBC 4.38 3.87 - 5.11 MIL/uL   Hemoglobin 10.3 (L) 12.0 - 15.0 g/dL   HCT 33.3 (L) 36.0 - 46.0 %   MCV 76.0 (L) 78.0 - 100.0 fL   MCH 23.5 (L) 26.0 - 34.0 pg   MCHC 30.9 30.0 - 36.0 g/dL   RDW 16.5 (H) 11.5 - 15.5 %   Platelets 293 150 - 400 K/uL   Neutrophils Relative % 77 %   Neutro Abs 9.4 (H) 1.7 - 7.7 K/uL   Lymphocytes Relative 12 %   Lymphs Abs 1.5 0.7 - 4.0 K/uL   Monocytes Relative 10 %   Monocytes Absolute 1.2 (H) 0.1 - 1.0 K/uL   Eosinophils Relative 1 %   Eosinophils Absolute 0.1 0.0 - 0.7 K/uL   Basophils Relative 0 %   Basophils Absolute 0.0 0.0 - 0.1 K/uL  I-Stat CG4 Lactic Acid, ED     Status: None   Collection Time: 06/09/16  7:42 PM  Result Value Ref Range   Lactic Acid, Venous 1.20 0.5 - 1.9 mmol/L  I-Stat CG4 Lactic Acid, ED     Status: None   Collection Time: 06/09/16 11:11 PM  Result Value Ref Range   Lactic Acid, Venous 0.73 0.5 - 1.9 mmol/L    Dg Chest 2 View  Result Date: 06/09/2016 CLINICAL DATA:  Abdominal pain and fever. Hernia surgery 2 weeks ago. EXAM: CHEST  2 VIEW COMPARISON:  03/02/2016 FINDINGS: Right subclavian Port-A-Cath remains in place with tip overlying the lower SVC. The cardiomediastinal silhouette is within normal limits. There is no evidence of airspace consolidation, edema, pleural effusion, or pneumothorax. Thoracic spondylosis is noted. IMPRESSION: No active cardiopulmonary disease. Electronically Signed   By: Logan Bores M.D.   On: 06/09/2016 21:38   Ct Abdomen Pelvis W Contrast  Result Date: 06/09/2016 CLINICAL DATA:  65 year old female with abdominal pain and fever. History of  hernia  surgery 2 weeks ago. EXAM: CT ABDOMEN AND PELVIS WITH CONTRAST TECHNIQUE: Multidetector CT imaging of the abdomen and pelvis was performed using the standard protocol following bolus administration of intravenous contrast. CONTRAST:  168m ISOVUE-300 IOPAMIDOL (ISOVUE-300) INJECTION 61% COMPARISON:  Abdominal CT dated 05/09/2016 FINDINGS: Lower chest: The visualized lung bases are clear. No intra-abdominal free air or free fluid. Hepatobiliary: Diffuse fatty infiltration of the liver. No intrahepatic biliary ductal dilatation. The gallbladder is unremarkable. Pancreas: Unremarkable. No pancreatic ductal dilatation or surrounding inflammatory changes. Spleen: Normal in size without focal abnormality. Adrenals/Urinary Tract: There is a 1.5 cm left adrenal hypodense nodule measuring less than 10 Hounsfield units most compatible with an adenoma. The right adrenal gland appears unremarkable. The kidneys, visualized ureters, and urinary bladder appear unremarkable. Stomach/Bowel: There postsurgical changes of gastric bypass with multiple anastomotic sutures in the small bowel. There is also partial resection of the sigmoid colon. No evidence of bowel obstruction or active inflammation. Normal appendix. Vascular/Lymphatic: There is mild moderate aortoiliac atherosclerotic disease. The abdominal aorta and IVC are otherwise unremarkable. No portal venous gas identified. There is no adenopathy. Reproductive: Hysterectomy. Other: There is a midline vertical anterior pelvic wall incisional scar. Postsurgical changes of ventral hernia repair noted. There is a large fluid collection in the anterior abdominal wall along measuring approximately 21 x 5 cm in greatest axial dimension and 22 cm in the craniocaudal length similar to the prior CT. There may be minimal enhancement of the adjacent capsule concerning for an infected collection. A 5.0 x 7.2 cm collection noted containing air-fluid level in the right anterior  abdominal wall lateral to the larger collection. This collection appears to be more superficial and likely in the subcutaneous tissues and superficial to the external oblique musculature. This collection is new since the prior CT. There is an apparent tract like communication extending from this collection to the deeper soft tissues and through the abdominal fascia and musculature and abutting the small bowel anastomosis in the right anterior abdominal wall. There is also apparent communication with the larger collection in the anterior abdominal wall. Although the smaller collection in the lateral right anterior abdominal wall may be extension of the larger collection in the midline, dehiscence of in the adjacent small bowel anastomosis is not entirely excluded. Further evaluation with CT with oral contrast is recommended. There is diffuse stranding of the subcutaneous soft tissues of the anterior abdominal wall. Musculoskeletal: Multilevel degenerative changes of the spine. No acute fracture. IMPRESSION: Persistent large loculated fluid in the anterior abdominal wall at the site of hernia repair concerning for infected fluid. There has been interval development of a smaller collection with air and fluid level in the right anterior abdominal wall laterally which appears to communicate with the larger collection in the midline anterior abdomen. A tract like communication also extends from the smaller collection in the right lateral anterior abdominal wall and abuts the adjacent small bowel anastomosis. Dehiscence of the anastomotic suture as the cause of fluid collection is not entirely excluded. Further evaluation with CT with oral contrast is recommended. Electronically Signed   By: AAnner CreteM.D.   On: 06/09/2016 23:29    Review of Systems  Constitutional: Positive for chills, fever and weight loss.  Gastrointestinal: Positive for abdominal pain. Negative for diarrhea, nausea and vomiting.   Blood  pressure (!) 134/47, pulse 73, temperature 99.9 F (37.7 C), temperature source Oral, resp. rate 19, height '5\' 2"'  (1.575 m), weight 129.4 kg (285 lb 4.8 oz), SpO2  98 %. Physical Exam  Vitals reviewed. Constitutional: She appears well-developed and well-nourished.  Eyes: No scleral icterus.  Neck: Neck supple.  Cardiovascular: Normal rate and regular rhythm.   Respiratory: Effort normal and breath sounds normal.  GI: She exhibits no distension. Bowel sounds are decreased. There is tenderness in the right lower quadrant. No hernia.      Assessment/Plan: Abdominal wall abscess, complication of vh repair that would be expected given incarceration and prior history and comorbidities  I think fluid overlying mesh is likely normal and not source of current problem.  The area in rlq is associated with sb enterotomy and concerning for leak. I think best plan would be for ir to aspirate this and possibly place drain both for treatment and this would then figure out what collection is.  Will consult ir for drainage.  Leave npo for now and remain off eliquis Errin Chewning 06/10/2016, 5:25 AM

## 2016-06-10 NOTE — Consult Note (Signed)
Chief Complaint: subcutaneous abdominal wall fluid collection  Referring Physician:Dr. Rolm Bookbinder  Supervising Physician: Daryll Brod  Patient Status: [facility] - In-pt  HPI: Suzanne Macdonald is an 65 y.o. female who underwent a ventral hernia repair about 2.5 weeks ago secondary to an incarcerated ventral hernia.  She did well until about a week after she got home.  She started developing some RLQ abdominal pain that mostly would hurt when she would stand up.  She has had some nausea, but no emesis, constipation, or diarrhea.  She saw Dr. Excell Seltzer this past week and said to let him know or go to the ED if the pain worsened.  She has run some low grade fevers.  Her pain persisted and she presented to the ED yesterday.  She had a CT scan that revealed a persistent large loculated fluid collection in the anterior abdominal wall, that is likely a seroma according to surgery and no intervention is warranted for this.  However, she was also found to have an interval development of a small collection with air and fluid level in the right anterior abdominal wall that appears to communicate with the larger collection in the midline and possibly a tract like communication also extends from the smaller collection and abuts the adjacent small bowel anastomosis concerning for possible fistula formation.  We have been asked to see the patient to evaluate for drain placement in this smaller collection.  She does take Eliquis for a. Fib and last took this on 10/21.  She also received a dose of heparin SQ this morning as well.  Past Medical History:  Past Medical History:  Diagnosis Date  . Asthma   . Atrial fibrillation ([place])   . Cervical cancer ([place]) 1972  . Colon cancer ([place])   . H/O gastric bypass   . Hyperlipidemia   . Hypertension   . Morbid obesity ([place]) 03/04/2016  . Sleep apnea     Past Surgical History:  Past Surgical History:  Procedure Laterality Date  . ABDOMINAL HYSTERECTOMY  1998   Salpingoophorectomy. For tumorous growth.  . BOWEL RESECTION  2015   [place], [place] by Dr. Stormy Fabian  . GASTRIC BYPASS  1992  . INSERTION OF MESH N/A 05/01/2016   Procedure: INSERTION OF MESH;  Surgeon: Excell Seltzer, MD;  Location: [facility];  Service: General;  Laterality: N/A;  . LAPAROSCOPIC GASTRIC BANDING     Placed 2013 and removed in 2014.  Marland Kitchen VENTRAL HERNIA REPAIR N/A 05/01/2016   Procedure: VENTRAL HERNIA REPAIR;  Surgeon: Excell Seltzer, MD;  Location[facility];  Service: General;  Laterality: N/A;    Family History:  Family History  Problem Relation Age of Onset  . Heart disease Mother   . Diabetes Mother   . Heart disease Father   . Stroke Father   . Diabetes Father   . Diabetes Sister   . Thyroid disease Sister   . Early death Brother     Killed by drunk driver  . Heart attack Brother   . Thyroid disease Daughter     s/p thyroidectomy  . Diabetes Sister   . Thyroid disease Sister   . Stroke Brother   . Drug abuse Brother   . Heart attack Brother     Social History:  reports that she has quit smoking. Her smoking use included Cigarettes. She quit after 0.20 years of use. She has never used smokeless tobacco. She reports that she does not drink alcohol or use drugs.  Allergies:  Allergies  Allergen Reactions  . Scallops [Shellfish Allergy] Hives    Medications: Medications reviewed in epic  Please HPI for pertinent positives, otherwise complete 10 system ROS negative.  Mallampati Score: MD Evaluation Airway: WNL Heart: WNL Abdomen: WNL Chest/ Lungs: WNL ASA  Classification: 2 Mallampati/Airway Score: Two  Physical Exam: BP (!) 156/84 (BP Location: Left Arm)   Pulse 62   Temp 99.4 F (37.4 C) (Oral)   Resp 16   Ht '5\' 2"'  (1.575 m)   Wt 285 lb 4.8 oz (129.4 kg)   SpO2 96%   BMI 52.18 kg/m  Body mass index is 52.18 kg/m. General: pleasant, obese white female who is laying in bed in NAD HEENT: head is normocephalic, atraumatic.  Sclera are  noninjected.  PERRL.  Ears and nose without any masses or lesions.  Mouth is pink and moist, no teeth Heart: tachycardic, irregularly irregular.  Normal s1,s2. Unable to determine if she has a murmur due to tachycardia. No obvious gallops, or rubs noted.  Palpable radial pulses bilaterally Lungs: CTAB, no wheezes, rhonchi, or rales noted.  Respiratory effort nonlabored Abd: soft, tender in RLQ or just inferior and right lateral to her midline incision, obese, +BS, no masses, hernias, or organomegaly.  She erythema or warmth noted.  Well healed midline incision Psych: A&Ox3 with an appropriate affect.   Labs: Results for orders placed or performed during the hospital encounter of 06/09/16 (from the past 48 hour(s))  Urinalysis, Routine w reflex microscopic     Status: Abnormal   Collection Time: 06/09/16  7:18 PM  Result Value Ref Range   Color, Urine YELLOW YELLOW   APPearance CLOUDY (A) CLEAR   Specific Gravity, Urine 1.025 1.005 - 1.030   pH 6.5 5.0 - 8.0   Glucose, UA NEGATIVE NEGATIVE mg/dL   Hgb urine dipstick NEGATIVE NEGATIVE   Bilirubin Urine SMALL (A) NEGATIVE   Ketones, ur NEGATIVE NEGATIVE mg/dL   Protein, ur NEGATIVE NEGATIVE mg/dL   Nitrite NEGATIVE NEGATIVE   Leukocytes, UA TRACE (A) NEGATIVE  Urine microscopic-add on     Status: Abnormal   Collection Time: 06/09/16  7:18 PM  Result Value Ref Range   Squamous Epithelial / LPF 0-5 (A) NONE SEEN   WBC, UA 0-5 0 - 5 WBC/hpf   RBC / HPF 0-5 0 - 5 RBC/hpf   Bacteria, UA MANY (A) NONE SEEN   Urine-Other MUCOUS PRESENT   Comprehensive metabolic panel     Status: Abnormal   Collection Time: 06/09/16  7:28 PM  Result Value Ref Range   Sodium 135 135 - 145 mmol/L   Potassium 3.0 (L) 3.5 - 5.1 mmol/L   Chloride 100 (L) 101 - 111 mmol/L   CO2 25 22 - 32 mmol/L   Glucose, Bld 125 (H) 65 - 99 mg/dL   BUN 12 6 - 20 mg/dL   Creatinine, Ser 0.78 0.44 - 1.00 mg/dL   Calcium 8.5 (L) 8.9 - 10.3 mg/dL   Total Protein 6.0 (L) 6.5 -  8.1 g/dL   Albumin 2.9 (L) 3.5 - 5.0 g/dL   AST 19 15 - 41 U/L   ALT 11 (L) 14 - 54 U/L   Alkaline Phosphatase 72 38 - 126 U/L   Total Bilirubin 1.1 0.3 - 1.2 mg/dL   GFR calc non Af Amer >60 >60 mL/min   GFR calc Af Amer >60 >60 mL/min    Comment: (NOTE) The eGFR has been calculated using the CKD EPI equation. This calculation  has not been validated in all clinical situations. eGFR's persistently <60 mL/min signify possible Chronic Kidney Disease.    Anion gap 10 5 - 15  CBC with Differential     Status: Abnormal   Collection Time: 06/09/16  7:28 PM  Result Value Ref Range   WBC 12.2 (H) 4.0 - 10.5 K/uL   RBC 4.38 3.87 - 5.11 MIL/uL   Hemoglobin 10.3 (L) 12.0 - 15.0 g/dL   HCT 33.3 (L) 36.0 - 46.0 %   MCV 76.0 (L) 78.0 - 100.0 fL   MCH 23.5 (L) 26.0 - 34.0 pg   MCHC 30.9 30.0 - 36.0 g/dL   RDW 16.5 (H) 11.5 - 15.5 %   Platelets 293 150 - 400 K/uL   Neutrophils Relative % 77 %   Neutro Abs 9.4 (H) 1.7 - 7.7 K/uL   Lymphocytes Relative 12 %   Lymphs Abs 1.5 0.7 - 4.0 K/uL   Monocytes Relative 10 %   Monocytes Absolute 1.2 (H) 0.1 - 1.0 K/uL   Eosinophils Relative 1 %   Eosinophils Absolute 0.1 0.0 - 0.7 K/uL   Basophils Relative 0 %   Basophils Absolute 0.0 0.0 - 0.1 K/uL  I-Stat CG4 Lactic Acid, ED     Status: None   Collection Time: 06/09/16  7:42 PM  Result Value Ref Range   Lactic Acid, Venous 1.20 0.5 - 1.9 mmol/L  I-Stat CG4 Lactic Acid, ED     Status: None   Collection Time: 06/09/16 11:11 PM  Result Value Ref Range   Lactic Acid, Venous 0.73 0.5 - 1.9 mmol/L    Imaging: Dg Chest 2 View  Result Date: 06/09/2016 CLINICAL DATA:  Abdominal pain and fever. Hernia surgery 2 weeks ago. EXAM: CHEST  2 VIEW COMPARISON:  03/02/2016 FINDINGS: Right subclavian Port-A-Cath remains in place with tip overlying the lower SVC. The cardiomediastinal silhouette is within normal limits. There is no evidence of airspace consolidation, edema, pleural effusion, or pneumothorax.  Thoracic spondylosis is noted. IMPRESSION: No active cardiopulmonary disease. Electronically Signed   By: Logan Bores M.D.   On: 06/09/2016 21:38   Ct Abdomen Pelvis W Contrast  Result Date: 06/09/2016 CLINICAL DATA:  65 year old female with abdominal pain and fever. History of hernia surgery 2 weeks ago. EXAM: CT ABDOMEN AND PELVIS WITH CONTRAST TECHNIQUE: Multidetector CT imaging of the abdomen and pelvis was performed using the standard protocol following bolus administration of intravenous contrast. CONTRAST:  114m ISOVUE-300 IOPAMIDOL (ISOVUE-300) INJECTION 61% COMPARISON:  Abdominal CT dated 05/09/2016 FINDINGS: Lower chest: The visualized lung bases are clear. No intra-abdominal free air or free fluid. Hepatobiliary: Diffuse fatty infiltration of the liver. No intrahepatic biliary ductal dilatation. The gallbladder is unremarkable. Pancreas: Unremarkable. No pancreatic ductal dilatation or surrounding inflammatory changes. Spleen: Normal in size without focal abnormality. Adrenals/Urinary Tract: There is a 1.5 cm left adrenal hypodense nodule measuring less than 10 Hounsfield units most compatible with an adenoma. The right adrenal gland appears unremarkable. The kidneys, visualized ureters, and urinary bladder appear unremarkable. Stomach/Bowel: There postsurgical changes of gastric bypass with multiple anastomotic sutures in the small bowel. There is also partial resection of the sigmoid colon. No evidence of bowel obstruction or active inflammation. Normal appendix. Vascular/Lymphatic: There is mild moderate aortoiliac atherosclerotic disease. The abdominal aorta and IVC are otherwise unremarkable. No portal venous gas identified. There is no adenopathy. Reproductive: Hysterectomy. Other: There is a midline vertical anterior pelvic wall incisional scar. Postsurgical changes of ventral hernia repair noted. There is a  large fluid collection in the anterior abdominal wall along measuring approximately  21 x 5 cm in greatest axial dimension and 22 cm in the craniocaudal length similar to the prior CT. There may be minimal enhancement of the adjacent capsule concerning for an infected collection. A 5.0 x 7.2 cm collection noted containing air-fluid level in the right anterior abdominal wall lateral to the larger collection. This collection appears to be more superficial and likely in the subcutaneous tissues and superficial to the external oblique musculature. This collection is new since the prior CT. There is an apparent tract like communication extending from this collection to the deeper soft tissues and through the abdominal fascia and musculature and abutting the small bowel anastomosis in the right anterior abdominal wall. There is also apparent communication with the larger collection in the anterior abdominal wall. Although the smaller collection in the lateral right anterior abdominal wall may be extension of the larger collection in the midline, dehiscence of in the adjacent small bowel anastomosis is not entirely excluded. Further evaluation with CT with oral contrast is recommended. There is diffuse stranding of the subcutaneous soft tissues of the anterior abdominal wall. Musculoskeletal: Multilevel degenerative changes of the spine. No acute fracture. IMPRESSION: Persistent large loculated fluid in the anterior abdominal wall at the site of hernia repair concerning for infected fluid. There has been interval development of a smaller collection with air and fluid level in the right anterior abdominal wall laterally which appears to communicate with the larger collection in the midline anterior abdomen. A tract like communication also extends from the smaller collection in the right lateral anterior abdominal wall and abuts the adjacent small bowel anastomosis. Dehiscence of the anastomotic suture as the cause of fluid collection is not entirely excluded. Further evaluation with CT with oral contrast is  recommended. Electronically Signed   By: Anner Crete M.D.   On: 06/09/2016 23:29    Assessment/Plan 1. Abdominal wall fluid collection 2. A. Fib with RVR  I have asked the nurse to contact the primary service due to her heart rate.  She has been noted to be sinus this hospitalization so far, but is found to be tachy and irregular right now.  Pulse rate is 130.  RN is contacting primary service for further care regarding this matter.  -the patient took eliquis last night and got a dose of subQ heparin at 0548 this morning.  Due to a concern for bleeding risk, we will not move forward with a drain today, but will wait for tomorrow. -she may have a diet today from our standpoint and NPO p MN tonight for a procedure tomorrow -check PT/INR -Hold heparin tomorrow until after the procedure -Risks and Benefits discussed with the patient including bleeding, infection, damage to adjacent structures, bowel perforation/fistula connection, and sepsis. All of the patient's questions were answered, patient is agreeable to proceed. Consent signed and in chart.  Thank you for this interesting consult.  I greatly enjoyed meeting Noga Fogg and look forward to participating in their care.  A copy of this report was sent to the requesting provider on this date.  Electronically Signed: Henreitta Cea 06/10/2016, 10:05 AM   I spent a total of 40 Minutes    in face to face in clinical consultation, greater than 50% of which was counseling/coordinating care for abdominal wall fluid collection

## 2016-06-10 NOTE — Progress Notes (Signed)
ANTICOAGULATION CONSULT NOTE - Initial Consult  Pharmacy Consult for Lovenox Indication: atrial fibrillation  Allergies  Allergen Reactions  . Scallops [Shellfish Allergy] Hives    Patient Measurements: Height: '5\' 2"'$  (157.5 cm) Weight: 285 lb 4.8 oz (129.4 kg) IBW/kg (Calculated) : 50.1 Heparin Dosing Weight:   Vital Signs: Temp: 99.4 F (37.4 C) (10/22 0605) Temp Source: Oral (10/22 0605) BP: 156/84 (10/22 0605) Pulse Rate: 62 (10/22 0605)  Labs:  Recent Labs  06/09/16 1928  HGB 10.3*  HCT 33.3*  PLT 293  CREATININE 0.78    Estimated Creatinine Clearance: 90.5 mL/min (by C-G formula based on SCr of 0.78 mg/dL).   Medical History: Past Medical History:  Diagnosis Date  . Asthma   . Atrial fibrillation ([place])   . Cervical cancer ([place]) 1972  . Colon cancer ([place])   . H/O gastric bypass   . Hyperlipidemia   . Hypertension   . Morbid obesity ([place]) 03/04/2016  . Sleep apnea     Medications:  Scheduled:  . atorvastatin  10 mg Oral Daily  . carvedilol  9.375 mg Oral BID WC  . flecainide  50 mg Oral BID  . flecainide  50 mg Oral Once  . hydrochlorothiazide  12.5 mg Oral Daily  . mometasone-formoterol  2 puff Inhalation BID  . pantoprazole  40 mg Oral Daily  . piperacillin-tazobactam (ZOSYN)  IV  3.375 g Intravenous Q8H  . polyethylene glycol  17 g Oral Daily  . traZODone  100 mg Oral QHS  . vancomycin  1,000 mg Intravenous Q12H    Assessment: 65yo female admitted with abdominal wall abscess, on Apixaban at home for AFib.  Pt is to start Lovenox bridge while off Apixaban, in case there is a need for surgery.  Last dose of Apixaban ~730PM last night.  Goal of Therapy:  Anti-Xa level 0.6-1 units/ml 4hrs after LMWH dose given Monitor platelets by anticoagulation protocol: Yes   Plan:  Lovenox '130mg'$  SQ q12, 1st dose now CBC q72 Watch for s/s of bleeding  Gracy Bruins, PharmD [facility]

## 2016-06-10 NOTE — H&P (Signed)
Date: 06/10/2016               Patient Name:  Suzanne Macdonald MRN: [id number]  DOB: February 07, 1951 Age / Sex: 65 y.o., female   PCP: Bartholomew Crews, MD         Medical Service: Internal Medicine Teaching Service         Attending Physician: Dr. Aldine Contes, MD    First Contact: Dr. Hetty Ely Pager: [phone]  Second Contact: Dr. Charlynn Grimes Pager: [phone]       After Hours (After 5p/  First Contact Pager: [phone]  weekends / holidays): Second Contact Pager: [phone]   Chief Complaint: Abdominal Pain  History of Present Illness: Suzanne Macdonald is a 65 yo female with PMHx of incarcerated ventral hernia s/p repair in September 2017, HTN, asthma, pre-diabetes, Obesity, h/o gastric bypass, h/o of colon (s/p resection and chemo) and cervical cancer, and aortic atherosclerosis who presents with fever and abdominal pain.   Patient states that she underwent surgical repair for an incarcerated hernia in September. Patient did well after surgery, but for the past 1 week she developed abdominal pain, fever, and chills. She admits to nausea, but denies vomiting. Patient admits she hasn't had much of an appetite at home and has lost about 15 pounds in the last one month. She has been trying to keep hydrated. She is having regular bowel movements and denies associated pain or bleeding or diarrhea. She admits to dysuria and dark urine since her surgery along with increased urgency but no increased frequency. Patient does admits to vaginal itching and burning. She used Vagisil and another type of ointment without relief. No chest pain, shortness of breath, numbness, or weakness.   Patient states she saw her orthopedic surgeon 2 days ago who recommended coming to the ED if fevers begin spiking and her symptoms worsen.    LABS: WBC 12.2 with inc Neutrophil count and monocytes, Hgb 10.3, Hct 33.3, Plt 293. Na 135, K 3.0, Cl 100, Glu 125, BUN 12, Cr. 0.78, Ca 8.5, Protein 6.0, Alb 2.9, AST 19, ALT 11, Alk p hos 72,  bili 1.1.  LA 1.20>0.73. UA: Cloudy, small bilirubin, trace LE, some squames, many bacteria.   Meds:  Current Meds  Medication Sig  . acetaminophen (TYLENOL) 500 MG tablet Take 1,000 mg by mouth every 6 (six) hours as needed for mild pain or moderate pain.  Marland Kitchen acetaminophen-codeine (TYLENOL #3) 300-30 MG tablet Take 1-2 tablets by mouth every 4 (four) hours as needed for moderate pain or severe pain.  Marland Kitchen albuterol (PROVENTIL HFA;VENTOLIN HFA) 108 (90 Base) MCG/ACT inhaler Inhale 1-2 puffs into the lungs every 6 (six) hours as needed for wheezing or shortness of breath.  Marland Kitchen apixaban (ELIQUIS) 5 MG TABS tablet Take 1 tablet (5 mg total) by mouth 2 (two) times daily.  Marland Kitchen atorvastatin (LIPITOR) 10 MG tablet take 1 tablet by mouth once daily  . budesonide-formoterol (SYMBICORT) 160-4.5 MCG/ACT inhaler Inhale 2 puffs into the lungs 2 (two) times daily.  . Calcium-Phosphorus-Vitamin D (CALCIUM/VITAMIN D3/ADULT GUMMY PO) Take 2 each by mouth daily.  . carvedilol (COREG) 6.25 MG tablet Take 1.5 tablets (9.375 mg total) by mouth 2 (two) times daily.  . cholecalciferol (VITAMIN D) 1000 units tablet Take 2,000 Units by mouth daily.   . cyanocobalamin 500 MCG tablet Take 1,000 mcg by mouth daily.   . flecainide (TAMBOCOR) 50 MG tablet Take 1 tablet (50 mg total) by mouth 2 (two) times daily.  . hydrochlorothiazide (  HYDRODIURIL) 25 MG tablet Take 0.5 tablets (12.5 mg total) by mouth daily.  . Multiple Vitamins-Minerals (ALIVE WOMENS GUMMY) CHEW Chew 2 each by mouth daily.  Marland Kitchen omeprazole (PRILOSEC) 40 MG capsule Take 1 capsule (40 mg total) by mouth daily.  . polyethylene glycol (MIRALAX / GLYCOLAX) packet Take 17 g by mouth daily.  Marland Kitchen senna-docusate (SENOKOT-S) 8.6-50 MG tablet Take 1 tablet by mouth at bedtime as needed for mild constipation.  . traZODone (DESYREL) 100 MG tablet Take 100 mg by mouth at bedtime.    Allergies: Allergies as of 06/09/2016 - Review Complete 06/09/2016  Allergen Reaction Noted  .  Scallops [shellfish allergy] Hives 06/09/2016   Past Medical History:  Diagnosis Date  . Asthma   . Atrial fibrillation ([place])   . Cervical cancer ([place]) 1972  . Colon cancer ([place])   . H/O gastric bypass   . Hyperlipidemia   . Hypertension   . Morbid obesity ([place]) 03/04/2016  . Sleep apnea     Family History: significant for CAD, CVA, DM, thyroid disease  Social History:  Tobacco Use: 20 pack year history; quit.  Alcohol Use: Occasional Illicit Drug Use: Denies  Review of Systems: A complete ROS was negative except as per HPI.   Physical Exam: Vitals:   06/09/16 2300 06/10/16 0015 06/10/16 0030 06/10/16 0100  BP: (!) 135/50 127/56 128/55 (!) 127/53  Pulse: 76 74 72 73  Resp: 21     Temp:      TempSrc:      SpO2: 95% 94% 95% 96%  Weight:      Height:       General: Vital signs reviewed.  Patient is well-developed and well-nourished, in no acute distress and cooperative with exam.  Head: Normocephalic and atraumatic. Eyes: EOMI, conjunctivae normal, no scleral icterus.  Mouth: moist, no tongue furrowing Neck: Supple, trachea midline, normal ROM, no JVD, masses, thyromegaly, or carotid bruit present.  Cardiovascular: RRR, S1 normal, S2 normal, systolic ejection murmur, gallops, or rubs. Pulmonary/Chest: Clear to auscultation bilaterally, no wheezes, rales, or rhonchi. Abdominal: Soft, obese, nondistended, points of tenderness to palpation in RUQ and RLQ, +BS, difficult to appreciate masses due to tenderness.   Musculoskeletal: No joint deformities, erythema, or stiffness, ROM full and nontender. Extremities: Minimal extremity edema bilaterally,  pulses symmetric and intact bilaterally. No cyanosis or clubbing. Neurological: A&O x3, Strength is normal and symmetric bilaterally, cranial nerve II-XII are grossly intact, no focal motor deficit, sensory intact to light touch bilaterally.  Skin: Warm, dry and intact. No rashes or erythema. Psychiatric: Normal mood and affect.  speech and behavior is normal. Cognition and memory are normal.   CXR: I personally reviewed the CXR and agree with the report - No acute cardiopulmonary disease.  CT abd/pel:  Persistent large loculated fluid in the anterior abdominal wall at the site of hernia repair concerning for infected fluid.  There has been interval development of a smaller collection with air and fluid level in the right anterior abdominal wall laterally which appears to communicate with the larger collection in the midline anterior abdomen. A tract like communication also extends from the smaller collection in the right lateral anterior abdominal wall and abuts the adjacent small bowel anastomosis. Dehiscence of the anastomotic suture as the cause of fluid collection is not entirely excluded. Further evaluation with CT with oral contrast is recommended.  Assessment & Plan by Problem: Principal Problem:   Abdominal wall abscess Active Problems:   Prediabetes   Essential hypertension  History of gastric bypass   History of colon cancer   History of cervical cancer   Asthma   Morbid obesity ([place])   Aortic atherosclerosis ([place])  Suzanne Macdonald is a 65 yo female with PMHx of incarcerated ventral hernia s/p repair in September 2017, HTN, asthma, pre-diabetes, obesity, h/o gastric bypass, h/o of colon and cervical cancer, and aortic atherosclerosis who presents with fever and abdominal pain.   Abdominal Wall Abscess: Patient presents with abdominal pain and fever following a repair for an incarcerated ventral hernia in September 2017. Patient is febrile here up to 102.7 with leukocytosis of 12.2. CT abdomen/pelvis showed a large persistent loculated abscess with possible tracts. Lactic acid 1.2>0.73. Surgery was notified and will evaluate in AM and decide IR vs surg for drainage. --Vancomycin 10/21>> --Zosyn 10/21 >> --Repeat CBC/BMET tomorrow am --BCx 10/21 >> --UCx 10/21>> --surgery following --consult IR  in AM for eval for drainage of abscess --zofran PRN for nausea  Candidal vulvovaginitis: Patient with one week history of dysuria, itching and irritation with UA not convincing for UTI.  --Patient on Zocyn for abdominal wall abscess which would cover E. Coli as it is most common bacterial cause of UTI --Diflucan 151m once for candidal infection  Paroxysmal A fib: Patient with history of A fib on Eliquis, controlled with carvedilol 9.3772mBID and flecainide 5042mID. Patient in sinus rhythm on exam. --Continue carvedilol 9.375 BID and flecainide 14m44mD --hold eliquis for possible procedure in AM  Hypokalemia: Patient with K of 3 on presentation. --Kdur 40mE10m --f/u AM labs  HTN: Patient with history of hypertension controlled with HCTZ 12.5mg d59my. --Continue HCTZ 12.5mg da62m  Hyperlipidemia: Patient on atorvastatin 10mg da23m--continue atorvastatin 10mg dai66mAsthma: Patient with history of asthma controlled with Proventil and Dulera. --Continue Proventil and Dulera  GERD: Patient with GERD controlled on omeprazole 40mg dail48m-continue omeprazole 40mg daily57mispo: Admit patient to Inpatient with expected length of stay greater than 2 midnights.  Signed: Pager: [place]or Groft SvalAlphonzo Grieve PGY1 Pager [phone]

## 2016-06-10 NOTE — Progress Notes (Signed)
Patient in A-fib with HR of 132 and  BP 131/100. EKG obtained and Dr. Juleen China notified. Await further orders.

## 2016-06-10 NOTE — Progress Notes (Signed)
   Subjective:  She reports feeling slightly better this morning. Still having decreased appetite and abdominal pain.   Objective:  Vital signs in last 24 hours: Vitals:   06/10/16 0235 06/10/16 0605 06/10/16 0853 06/10/16 1340  BP: (!) 134/47 (!) 156/84  113/75  Pulse: 73 62  (!) 140  Resp: '19 18 16   '$ Temp: 99.9 F (37.7 C) 99.4 F (37.4 C)  99.5 F (37.5 C)  TempSrc: Oral Oral  Oral  SpO2: 98% 96%  99%  Weight: 285 lb 4.8 oz (129.4 kg)     Height: '5\' 2"'$  (1.575 m)      GENERAL- alert, co-operative, appears as stated age, not in any distress. CARDIAC- RRR, no murmurs, rubs or gallops at time of exam RESP- Moving equal volumes of air, and clear to auscultation bilaterally, no wheezes or crackles. ABDOMEN- soft, obese, R sided tenderness to palpation, no guarding or rebound EXTREMITIES- pulse 2+, symmetric, no pedal edema. SKIN- Warm, dry, No rash or lesion. PSYCH- Normal mood and affect, appropriate thought content and speech.   Assessment/Plan:  Abdominal Wall Fluid Collection:  Patient with recent ventral hernia repair presents with abdominal pain and fever. She was febrile to 102.7 on admission but has been afebrile since. CT abdomen showing abdominal wall fluid collection at the site of the repair with possible communicating tract to smaller right sided fluid collection with air fluid level. This appears to be connect to adjacent small bowel anastomosis. Surgery consulted and recommended IR consult to drain the fluid collection and send for culture. Evaluated by IR today and plan for drainage in AM. Last dose of Eliquis was 10/21 PM. Will continue broad spectrum abx until culture data available to guide therapy. WBC 12.2.  -Vancomycin 10/21>> -Zosyn 10/21 >> -Repeat CBC/BMET tomorrow am -BCx 10/21 >> -UCx 10/21>> -surgery following -appreciate IR consult -zofran PRN for nausea -NPO at MN  Candidal vulvovaginitis: Patient with one week history of dysuria, itching and  irritation with UA not convincing for UTI. Patient on Zocyn for abdominal wall abscess which would cover E. Coli as it is most common bacterial cause of UTI -Diflucan '150mg'$  once for candidal infection  AFib with RVR: In NSR this morning on exam. Paged afterwards indicating that patient gone into A. Fib with RVR. HR in the 140-150s. EKG obtained confirmed A.Fib. She is on carvedilol 9.'375mg'$  BID and flecainide '50mg'$  BID at home. Consulted Cardiology for further recommendations. Initially recommended giving additional dose of Flecainide 50 mg and monitor response. She did not convert to NSR afterwards. Started IV Cardizem for rate control. -Telemetry -Continue carvedilol 9.375 BID and flecainide '50mg'$  BID -Cardizem gtt -Lovenox therapeutic dose now, hold in am prior procedure with IR  Hypokalemia: Patient with K of 3 on presentation. --Kdur 44mq  --BMET in am  HTN: Patient with history of hypertension controlled with HCTZ 12.'5mg'$  daily. --Continue HCTZ 12.'5mg'$  daily  Hyperlipidemia: Patient on atorvastatin '10mg'$  daily --continue atorvastatin '10mg'$  daily  Asthma: Patient with history of asthma controlled with Proventil and Dulera. --Continue Proventil and Dulera  GERD: Patient with GERD controlled on omeprazole '40mg'$  daily. --continue omeprazole '40mg'$  daily.   Dispo: Anticipated discharge in approximately 2-3 day(s).   NMaryellen Pile MD 06/10/2016, 3:27 PM Pager: [phone]

## 2016-06-10 NOTE — Consult Note (Addendum)
Patient ID: Suzanne Macdonald MRN: [id number], DOB/AGE: 65-Jul-1952   Admit date: 06/09/2016   Reason for Consult: Atrial Fibrillation w/ RVR Requesting MD: Dr. Dareen Piano   Primary Physician: [name], MD Primary Cardiologist: Dr. Oval Linsey  Pt. Profile:  65 y/o female with h/o atrial fibrillation admitted for worsening abd pain and fevers 2 weeks after recent ventral hernia repair surgery for an incarcerated hernia. Found to have abdominal wall fluid collection, concerning for abscess. She is on antibiotics. Cardiology consulted for atrial fibrillation w/ RVR.  Problem List  Past Medical History:  Diagnosis Date  . Asthma   . Atrial fibrillation ([place])   . Cervical cancer ([place]) 1972  . Colon cancer ([place])   . H/O gastric bypass   . Hyperlipidemia   . Hypertension   . Morbid obesity ([place]) 03/04/2016  . Sleep apnea     Past Surgical History:  Procedure Laterality Date  . ABDOMINAL HYSTERECTOMY  1998   Salpingoophorectomy. For tumorous growth.  . BOWEL RESECTION  2015   [place] [place] by Dr. Stormy Fabian  . GASTRIC BYPASS  1992  . INSERTION OF MESH N/A 05/01/2016   Procedure: INSERTION OF MESH;  Surgeon: Excell Seltzer, MD;  Location: [facility];  Service: General;  Laterality: N/A;  . LAPAROSCOPIC GASTRIC BANDING     Placed 2013 and removed in 2014.  Marland Kitchen VENTRAL HERNIA REPAIR N/A 05/01/2016   Procedure: VENTRAL HERNIA REPAIR;  Surgeon: Excell Seltzer, MD;  Location: [facility];  Service: General;  Laterality: N/A;     Allergies  Allergies  Allergen Reactions  . Scallops [Shellfish Allergy] Hives    HPI  65 y/o female with PMH of incarcerated ventral hernia s/p repair 2 weeks ago, HTN, asthma pre diabetes, h/o gastric bypass, colon Ca s/p resection and chemo p/w worsening abd pain and fevers x 1 week. Admitted by Internal medicine and found to have abdominal wall fluid collection, concerning for abscess. She is on antibiotics. Cardiology consulted for atrial fibrillation w/  RVR. She has been followed by Dr. Domenic Polite. She has a history of paroxysmal atrial fibrillation diagnosed approximately 3 years ago. CHADSVASC score is 3, she has not been anticoagulated however. She underwent a cardiac catheterization in [place] 2 years ago prior to undergoing colon cancer surgery. She recalls being told that she had no significant blockages in her coronary arteries.  She is asymptomatic with her afib, other than feeling tired. Rate currently in the 140s. BP stable.  No chest pain.   Home Medications  Prior to Admission medications   Medication Sig Start Date End Date Taking? Authorizing Provider  acetaminophen (TYLENOL) 500 MG tablet Take 1,000 mg by mouth every 6 (six) hours as needed for mild pain or moderate pain.   Yes Historical Provider, MD  acetaminophen-codeine (TYLENOL #3) 300-30 MG tablet Take 1-2 tablets by mouth every 4 (four) hours as needed for moderate pain or severe pain. 05/06/16  Yes Bethany Molt, DO  albuterol (PROVENTIL HFA;VENTOLIN HFA) 108 (90 Base) MCG/ACT inhaler Inhale 1-2 puffs into the lungs every 6 (six) hours as needed for wheezing or shortness of breath.   Yes Historical Provider, MD  apixaban (ELIQUIS) 5 MG TABS tablet Take 1 tablet (5 mg total) by mouth 2 (two) times daily. 03/13/16  Yes Shela Leff, MD  atorvastatin (LIPITOR) 10 MG tablet take 1 tablet by mouth once daily 04/04/16  Yes Shela Leff, MD  budesonide-formoterol [facility]) 160-4.5 MCG/ACT inhaler Inhale 2 puffs into the lungs 2 (two) times daily. 01/24/16  Yes Milagros Loll, MD  Calcium-Phosphorus-Vitamin D (CALCIUM/VITAMIN D3/ADULT GUMMY PO) Take 2 each by mouth daily.   Yes Historical Provider, MD  carvedilol (COREG) 6.25 MG tablet Take 1.5 tablets (9.375 mg total) by mouth 2 (two) times daily. 04/27/16  Yes [name], MD  cholecalciferol (VITAMIN D) 1000 units tablet Take 2,000 Units by mouth daily.    Yes Historical Provider, MD  cyanocobalamin 500 MCG tablet  Take 1,000 mcg by mouth daily.    Yes Historical Provider, MD  flecainide (TAMBOCOR) 50 MG tablet Take 1 tablet (50 mg total) by mouth 2 (two) times daily. 04/27/16  Yes [name], MD  hydrochlorothiazide (HYDRODIURIL) 25 MG tablet Take 0.5 tablets (12.5 mg total) by mouth daily. 04/27/16  Yes [name], MD  Multiple Vitamins-Minerals (ALIVE WOMENS GUMMY) CHEW Chew 2 each by mouth daily.   Yes Historical Provider, MD  omeprazole (PRILOSEC) 40 MG capsule Take 1 capsule (40 mg total) by mouth daily. 04/27/16  Yes [name], MD  polyethylene glycol (MIRALAX / GLYCOLAX) packet Take 17 g by mouth daily. 05/06/16  Yes Bethany Molt, DO  senna-docusate (SENOKOT-S) 8.6-50 MG tablet Take 1 tablet by mouth at bedtime as needed for mild constipation. 05/06/16  Yes Bethany Molt, DO  traZODone (DESYREL) 100 MG tablet Take 100 mg by mouth at bedtime. 04/22/16  Yes Historical Provider, MD  ondansetron (ZOFRAN-ODT) 8 MG disintegrating tablet Take 1 tablet (8 mg total) by mouth every 8 (eight) hours as needed for nausea or vomiting. Patient not taking: Reported on 06/09/2016 01/22/16   Davonna Belling, [place]  . atorvastatin  10 mg Oral Daily  . carvedilol  9.375 mg Oral BID WC  . enoxaparin (LOVENOX) injection  130 mg Subcutaneous Q12H  . flecainide  50 mg Oral BID  . hydrochlorothiazide  12.5 mg Oral Daily  . mometasone-formoterol  2 puff Inhalation BID  . pantoprazole  40 mg Oral Daily  . piperacillin-tazobactam (ZOSYN)  IV  3.375 g Intravenous Q8H  . polyethylene glycol  17 g Oral Daily  . traZODone  100 mg Oral QHS  . vancomycin  1,000 mg Intravenous Q12H    Family History  Family History  Problem Relation Age of Onset  . Heart disease Mother   . Diabetes Mother   . Heart disease Father   . Stroke Father   . Diabetes Father   . Diabetes Sister   . Thyroid disease Sister   . Early death Brother     Killed by drunk driver  . Heart attack Brother   . Thyroid disease  Daughter     s/p thyroidectomy  . Diabetes Sister   . Thyroid disease Sister   . Stroke Brother   . Drug abuse Brother   . Heart attack Brother     Social History  Social History   Social History  . Marital status: Married    Spouse name: N/A  . Number of children: N/A  . Years of education: N/A   Occupational History  . Not on file.   Social History Main Topics  . Smoking status: Former Smoker    Years: 0.20    Types: Cigarettes  . Smokeless tobacco: Never Used  . Alcohol use No     Comment: a beer every now and then  . Drug use: No  . Sexual activity: No   Other Topics Concern  . Not on file   Social History Narrative  . No narrative on  file     Review of Systems General:  No chills, fever, night sweats or weight changes.  Cardiovascular:  No chest pain, dyspnea on exertion, edema, orthopnea, palpitations, paroxysmal nocturnal dyspnea. Dermatological: No rash, lesions/masses Respiratory: No cough, dyspnea Urologic: No hematuria, dysuria Abdominal:   No nausea, vomiting, diarrhea, bright red blood per rectum, melena, or hematemesis Neurologic:  No visual changes, wkns, changes in mental status. All other systems reviewed and are otherwise negative except as noted above.  Physical Exam  Blood pressure (!) 156/84, pulse 62, temperature 99.4 F (37.4 C), temperature source Oral, resp. rate 16, height '5\' 2"'$  (1.575 m), weight 285 lb 4.8 oz (129.4 kg), SpO2 96 %.  General: Pleasant, NAD Psych: Normal affect. Neuro: Alert and oriented X 3. Moves all extremities spontaneously. HEENT: Normal  Neck: Supple without bruits or JVD. Lungs:  Resp regular and unlabored, CTA. Heart: RRR no s3, s4, or murmurs. Abdomen: Soft, non-tender, non-distended, BS + x 4.  Extremities: No clubbing, cyanosis or edema. DP/PT/Radials 2+ and equal bilaterally.  Labs  Troponin (Point of Care Test) No results for input(s): TROPIPOC in the last 72 hours. No results for input(s):  CKTOTAL, CKMB, TROPONINI in the last 72 hours. Lab Results  Component Value Date   WBC 12.2 (H) 06/09/2016   HGB 10.3 (L) 06/09/2016   HCT 33.3 (L) 06/09/2016   MCV 76.0 (L) 06/09/2016   PLT 293 06/09/2016    Recent Labs Lab 06/09/16 1928  NA 135  K 3.0*  CL 100*  CO2 25  BUN 12  CREATININE 0.78  CALCIUM 8.5*  PROT 6.0*  BILITOT 1.1  ALKPHOS 72  ALT 11*  AST 19  GLUCOSE 125*   Lab Results  Component Value Date   CHOL 227 (H) 01/24/2016   HDL 50 01/24/2016   LDLCALC 151 (H) 01/24/2016   TRIG 131 01/24/2016   No results found for: DDIMER   Radiology/Studies  Dg Chest 2 View  Result Date: 06/09/2016 CLINICAL DATA:  Abdominal pain and fever. Hernia surgery 2 weeks ago. EXAM: CHEST  2 VIEW COMPARISON:  03/02/2016 FINDINGS: Right subclavian Port-A-Cath remains in place with tip overlying the lower SVC. The cardiomediastinal silhouette is within normal limits. There is no evidence of airspace consolidation, edema, pleural effusion, or pneumothorax. Thoracic spondylosis is noted. IMPRESSION: No active cardiopulmonary disease. Electronically Signed   By: Logan Bores M.D.   On: 06/09/2016 21:38   Ct Abdomen Pelvis W Contrast  Result Date: 06/09/2016 CLINICAL DATA:  65 year old female with abdominal pain and fever. History of hernia surgery 2 weeks ago. EXAM: CT ABDOMEN AND PELVIS WITH CONTRAST TECHNIQUE: Multidetector CT imaging of the abdomen and pelvis was performed using the standard protocol following bolus administration of intravenous contrast. CONTRAST:  116m ISOVUE-300 IOPAMIDOL (ISOVUE-300) INJECTION 61% COMPARISON:  Abdominal CT dated 05/09/2016 FINDINGS: Lower chest: The visualized lung bases are clear. No intra-abdominal free air or free fluid. Hepatobiliary: Diffuse fatty infiltration of the liver. No intrahepatic biliary ductal dilatation. The gallbladder is unremarkable. Pancreas: Unremarkable. No pancreatic ductal dilatation or surrounding inflammatory changes.  Spleen: Normal in size without focal abnormality. Adrenals/Urinary Tract: There is a 1.5 cm left adrenal hypodense nodule measuring less than 10 Hounsfield units most compatible with an adenoma. The right adrenal gland appears unremarkable. The kidneys, visualized ureters, and urinary bladder appear unremarkable. Stomach/Bowel: There postsurgical changes of gastric bypass with multiple anastomotic sutures in the small bowel. There is also partial resection of the sigmoid colon. No evidence of bowel  obstruction or active inflammation. Normal appendix. Vascular/Lymphatic: There is mild moderate aortoiliac atherosclerotic disease. The abdominal aorta and IVC are otherwise unremarkable. No portal venous gas identified. There is no adenopathy. Reproductive: Hysterectomy. Other: There is a midline vertical anterior pelvic wall incisional scar. Postsurgical changes of ventral hernia repair noted. There is a large fluid collection in the anterior abdominal wall along measuring approximately 21 x 5 cm in greatest axial dimension and 22 cm in the craniocaudal length similar to the prior CT. There may be minimal enhancement of the adjacent capsule concerning for an infected collection. A 5.0 x 7.2 cm collection noted containing air-fluid level in the right anterior abdominal wall lateral to the larger collection. This collection appears to be more superficial and likely in the subcutaneous tissues and superficial to the external oblique musculature. This collection is new since the prior CT. There is an apparent tract like communication extending from this collection to the deeper soft tissues and through the abdominal fascia and musculature and abutting the small bowel anastomosis in the right anterior abdominal wall. There is also apparent communication with the larger collection in the anterior abdominal wall. Although the smaller collection in the lateral right anterior abdominal wall may be extension of the larger  collection in the midline, dehiscence of in the adjacent small bowel anastomosis is not entirely excluded. Further evaluation with CT with oral contrast is recommended. There is diffuse stranding of the subcutaneous soft tissues of the anterior abdominal wall. Musculoskeletal: Multilevel degenerative changes of the spine. No acute fracture. IMPRESSION: Persistent large loculated fluid in the anterior abdominal wall at the site of hernia repair concerning for infected fluid. There has been interval development of a smaller collection with air and fluid level in the right anterior abdominal wall laterally which appears to communicate with the larger collection in the midline anterior abdomen. A tract like communication also extends from the smaller collection in the right lateral anterior abdominal wall and abuts the adjacent small bowel anastomosis. Dehiscence of the anastomotic suture as the cause of fluid collection is not entirely excluded. Further evaluation with CT with oral contrast is recommended. Electronically Signed   By: Anner Crete M.D.   On: 06/09/2016 23:29    ECG  Atrial fibrillation w/ RVR   ASSESSMENT AND PLAN  Principal Problem:   Abdominal wall abscess Active Problems:   Prediabetes   Essential hypertension   History of gastric bypass   History of colon cancer   History of cervical cancer   Asthma   Morbid obesity ([place])   Aortic atherosclerosis ([place])   1. Abdominal Wall Abscess: management IM and general surgery.   2. Atrial Fibrillation w/ RVR: patient with h/o atrial fibrillation. Suspect rapid ventricular response is subsequent to underlying illness/ abscess. She is asymptomatic at rest. Continue antibiotics for abscess. Continue Flecainide and coreg for rhythm and rate control. Given her rate in the 140s, she will also need IV Cardizem for rate control. Monitor on telemetry. Monitor BP. Correct hypokalemia. Supplement K.    Signed, Lyda Jester,  PA-C 06/10/2016, 3:05 PM    I have examined the patient and reviewed assessment and plan and discussed with patient.  Agree with above as stated.  Known history of PAF.  Extra flecainide given.  Patient is asymptomatic.  No palpitations, SHOB or CP.  She is lying flat comfortably.  Plan right now is to give her Diltiazem 10 mg IV x1.  WIll add short acting diltiazem 30 mg PO q 6  hours.  IV drips cannot be done on thyis floor but given how stable she appears, she should tolerate the oral medicine which has the same bioavailability as the IV form.  Increase flecainide to 100 mg BID as well.  Hopefully, she will convert to NSR.    After she leaves the hospital and is able, she will need a treadmill test on the higher dose of flecainide if that is continued.      Larae Grooms

## 2016-06-11 ENCOUNTER — Inpatient Hospital Stay (HOSPITAL_COMMUNITY): Payer: Medicare Other

## 2016-06-11 DIAGNOSIS — Z9889 Other specified postprocedural states: Secondary | ICD-10-CM

## 2016-06-11 LAB — BASIC METABOLIC PANEL
Anion gap: 9 (ref 5–15)
BUN: 13 mg/dL (ref 6–20)
CALCIUM: 7.9 mg/dL — AB (ref 8.9–10.3)
CO2: 26 mmol/L (ref 22–32)
CREATININE: 0.9 mg/dL (ref 0.44–1.00)
Chloride: 99 mmol/L — ABNORMAL LOW (ref 101–111)
GFR calc Af Amer: >60 mL/min (ref >60)
GFR calc non Af Amer: >60 mL/min (ref >60)
GLUCOSE: 138 mg/dL — AB (ref 65–99)
Potassium: 3.1 mmol/L — ABNORMAL LOW (ref 3.5–5.1)
Sodium: 134 mmol/L — ABNORMAL LOW (ref 135–145)

## 2016-06-11 LAB — CBC
HEMATOCRIT: 28.2 % — AB (ref 36.0–46.0)
Hemoglobin: 8.7 g/dL — ABNORMAL LOW (ref 12.0–15.0)
MCH: 23.6 pg — ABNORMAL LOW (ref 26.0–34.0)
MCHC: 30.9 g/dL (ref 30.0–36.0)
MCV: 76.4 fL — AB (ref 78.0–100.0)
PLATELETS: 257 10*3/uL (ref 150–400)
RBC: 3.69 MIL/uL — ABNORMAL LOW (ref 3.87–5.11)
RDW: 16.6 % — AB (ref 11.5–15.5)
WBC: 11.5 10*3/uL — ABNORMAL HIGH (ref 4.0–10.5)

## 2016-06-11 LAB — PROTIME-INR
INR: 1.28
Prothrombin Time: 16.1 seconds — ABNORMAL HIGH (ref 11.4–15.2)

## 2016-06-11 LAB — MAGNESIUM: Magnesium: 1.7 mg/dL (ref 1.7–2.4)

## 2016-06-11 MED ORDER — SODIUM CHLORIDE 0.9% FLUSH
10.0000 mL | INTRAVENOUS | Status: DC | PRN
  Administered 2016-06-12: 30 mL
  Administered 2016-06-12 – 2016-06-14 (×5): 10 mL
  Filled 2016-06-11 (×6): qty 40

## 2016-06-11 MED ORDER — MIDAZOLAM HCL 2 MG/2ML IJ SOLN
1.0000 mg | Freq: Once | INTRAMUSCULAR | Status: AC
  Administered 2016-06-11: 1 mg via INTRAVENOUS

## 2016-06-11 MED ORDER — MIDAZOLAM HCL 2 MG/2ML IJ SOLN
INTRAMUSCULAR | Status: AC
  Filled 2016-06-11: qty 2

## 2016-06-11 MED ORDER — SODIUM CHLORIDE 0.9 % IV SOLN
INTRAVENOUS | Status: AC
  Administered 2016-06-11: 18:00:00 via INTRAVENOUS

## 2016-06-11 MED ORDER — LISINOPRIL 5 MG PO TABS
5.0000 mg | ORAL_TABLET | Freq: Every day | ORAL | Status: DC
  Administered 2016-06-11 – 2016-06-14 (×4): 5 mg via ORAL
  Filled 2016-06-11 (×4): qty 1

## 2016-06-11 MED ORDER — DILTIAZEM HCL ER COATED BEADS 120 MG PO TB24
120.0000 mg | ORAL_TABLET | Freq: Every day | ORAL | Status: DC
Start: 2016-06-11 — End: 2016-06-12
  Filled 2016-06-11: qty 1

## 2016-06-11 MED ORDER — FENTANYL CITRATE (PF) 100 MCG/2ML IJ SOLN
INTRAMUSCULAR | Status: AC
  Filled 2016-06-11: qty 2

## 2016-06-11 MED ORDER — LIDOCAINE HCL 1 % IJ SOLN
INTRAMUSCULAR | Status: AC
  Filled 2016-06-11: qty 20

## 2016-06-11 MED ORDER — POTASSIUM CHLORIDE CRYS ER 20 MEQ PO TBCR
40.0000 meq | EXTENDED_RELEASE_TABLET | Freq: Two times a day (BID) | ORAL | Status: AC
  Administered 2016-06-11 – 2016-06-12 (×2): 40 meq via ORAL
  Filled 2016-06-11 (×2): qty 2

## 2016-06-11 MED ORDER — FENTANYL CITRATE (PF) 100 MCG/2ML IJ SOLN
50.0000 ug | Freq: Once | INTRAMUSCULAR | Status: AC
  Administered 2016-06-11: 50 ug via INTRAVENOUS

## 2016-06-11 MED ORDER — POTASSIUM CHLORIDE CRYS ER 20 MEQ PO TBCR
40.0000 meq | EXTENDED_RELEASE_TABLET | Freq: Two times a day (BID) | ORAL | Status: DC
  Administered 2016-06-11: 40 meq via ORAL
  Filled 2016-06-11: qty 2

## 2016-06-11 NOTE — Progress Notes (Signed)
Procedure completed. Pt. Axox3. Ready for transport.

## 2016-06-11 NOTE — Progress Notes (Signed)
DAILY PROGRESS NOTE  Subjective:  Converted to sinus overnight- maintaining in the 60's.   Objective:  Temp:  [99.1 F (37.3 C)-100.3 F (37.9 C)] 99.2 F (37.3 C) (10/23 0554) Pulse Rate:  [73-140] 75 (10/23 0739) Resp:  [17-18] 18 (10/23 0739) BP: (113-131)/(51-100) 131/52 (10/23 0554) SpO2:  [97 %-99 %] 97 % (10/23 0739) Weight change:   Intake/Output from previous day: 10/22 0701 - 10/23 0700 In: -  Out: 275 [Urine:275]  Intake/Output from this shift: Total I/O In: 600 [IV Piggyback:600] Out: 200 [Urine:200]  Medications: No current facility-administered medications on file prior to encounter.    Current Outpatient Prescriptions on File Prior to Encounter  Medication Sig Dispense Refill  . acetaminophen (TYLENOL) 500 MG tablet Take 1,000 mg by mouth every 6 (six) hours as needed for mild pain or moderate pain.    Marland Kitchen acetaminophen-codeine (TYLENOL #3) 300-30 MG tablet Take 1-2 tablets by mouth every 4 (four) hours as needed for moderate pain or severe pain. 60 tablet 0  . albuterol (PROVENTIL HFA;VENTOLIN HFA) 108 (90 Base) MCG/ACT inhaler Inhale 1-2 puffs into the lungs every 6 (six) hours as needed for wheezing or shortness of breath.    Marland Kitchen apixaban (ELIQUIS) 5 MG TABS tablet Take 1 tablet (5 mg total) by mouth 2 (two) times daily. 180 tablet 1  . atorvastatin (LIPITOR) 10 MG tablet take 1 tablet by mouth once daily 90 tablet 1  . budesonide-formoterol (SYMBICORT) 160-4.5 MCG/ACT inhaler Inhale 2 puffs into the lungs 2 (two) times daily. 1 Inhaler 1  . Calcium-Phosphorus-Vitamin D (CALCIUM/VITAMIN D3/ADULT GUMMY PO) Take 2 each by mouth daily.    . carvedilol (COREG) 6.25 MG tablet Take 1.5 tablets (9.375 mg total) by mouth 2 (two) times daily. 270 tablet 1  . cholecalciferol (VITAMIN D) 1000 units tablet Take 2,000 Units by mouth daily.     . cyanocobalamin 500 MCG tablet Take 1,000 mcg by mouth daily.     . flecainide (TAMBOCOR) 50 MG tablet Take 1 tablet (50 mg  total) by mouth 2 (two) times daily. 180 tablet 1  . hydrochlorothiazide (HYDRODIURIL) 25 MG tablet Take 0.5 tablets (12.5 mg total) by mouth daily. 45 tablet 1  . Multiple Vitamins-Minerals (ALIVE WOMENS GUMMY) CHEW Chew 2 each by mouth daily.    Marland Kitchen omeprazole (PRILOSEC) 40 MG capsule Take 1 capsule (40 mg total) by mouth daily. 90 capsule 1  . polyethylene glycol (MIRALAX / GLYCOLAX) packet Take 17 g by mouth daily. 14 each 0  . senna-docusate (SENOKOT-S) 8.6-50 MG tablet Take 1 tablet by mouth at bedtime as needed for mild constipation. 30 tablet 0  . traZODone (DESYREL) 100 MG tablet Take 100 mg by mouth at bedtime.  0  . ondansetron (ZOFRAN-ODT) 8 MG disintegrating tablet Take 1 tablet (8 mg total) by mouth every 8 (eight) hours as needed for nausea or vomiting. (Patient not taking: Reported on 06/09/2016) 10 tablet 0    Physical Exam: General appearance: alert and no distress Lungs: clear to auscultation bilaterally Heart: regular rate and rhythm, S1, S2 normal, no murmur, click, rub or gallop Extremities: extremities normal, atraumatic, no cyanosis or edema Neurologic: Grossly normal  Lab Results: Results for orders placed or performed during the hospital encounter of 06/09/16 (from the past 48 hour(s))  Culture, blood (Routine x 2)     Status: None (Preliminary result)   Collection Time: 06/09/16  7:10 PM  Result Value Ref Range   Specimen Description BLOOD RIGHT ARM  Special Requests BOTTLES DRAWN AEROBIC AND ANAEROBIC 6CC    Culture NO GROWTH < 24 HOURS    Report Status PENDING   Urinalysis, Routine w reflex microscopic     Status: Abnormal   Collection Time: 06/09/16  7:18 PM  Result Value Ref Range   Color, Urine YELLOW YELLOW   APPearance CLOUDY (A) CLEAR   Specific Gravity, Urine 1.025 1.005 - 1.030   pH 6.5 5.0 - 8.0   Glucose, UA NEGATIVE NEGATIVE mg/dL   Hgb urine dipstick NEGATIVE NEGATIVE   Bilirubin Urine SMALL (A) NEGATIVE   Ketones, ur NEGATIVE NEGATIVE  mg/dL   Protein, ur NEGATIVE NEGATIVE mg/dL   Nitrite NEGATIVE NEGATIVE   Leukocytes, UA TRACE (A) NEGATIVE  Urine microscopic-add on     Status: Abnormal   Collection Time: 06/09/16  7:18 PM  Result Value Ref Range   Squamous Epithelial / LPF 0-5 (A) NONE SEEN   WBC, UA 0-5 0 - 5 WBC/hpf   RBC / HPF 0-5 0 - 5 RBC/hpf   Bacteria, UA MANY (A) NONE SEEN   Urine-Other MUCOUS PRESENT   Comprehensive metabolic panel     Status: Abnormal   Collection Time: 06/09/16  7:28 PM  Result Value Ref Range   Sodium 135 135 - 145 mmol/L   Potassium 3.0 (L) 3.5 - 5.1 mmol/L   Chloride 100 (L) 101 - 111 mmol/L   CO2 25 22 - 32 mmol/L   Glucose, Bld 125 (H) 65 - 99 mg/dL   BUN 12 6 - 20 mg/dL   Creatinine, Ser 0.78 0.44 - 1.00 mg/dL   Calcium 8.5 (L) 8.9 - 10.3 mg/dL   Total Protein 6.0 (L) 6.5 - 8.1 g/dL   Albumin 2.9 (L) 3.5 - 5.0 g/dL   AST 19 15 - 41 U/L   ALT 11 (L) 14 - 54 U/L   Alkaline Phosphatase 72 38 - 126 U/L   Total Bilirubin 1.1 0.3 - 1.2 mg/dL   GFR calc non Af Amer >60 >60 mL/min   GFR calc Af Amer >60 >60 mL/min    Comment: (NOTE) The eGFR has been calculated using the CKD EPI equation. This calculation has not been validated in all clinical situations. eGFR's persistently <60 mL/min signify possible Chronic Kidney Disease.    Anion gap 10 5 - 15  CBC with Differential     Status: Abnormal   Collection Time: 06/09/16  7:28 PM  Result Value Ref Range   WBC 12.2 (H) 4.0 - 10.5 K/uL   RBC 4.38 3.87 - 5.11 MIL/uL   Hemoglobin 10.3 (L) 12.0 - 15.0 g/dL   HCT 33.3 (L) 36.0 - 46.0 %   MCV 76.0 (L) 78.0 - 100.0 fL   MCH 23.5 (L) 26.0 - 34.0 pg   MCHC 30.9 30.0 - 36.0 g/dL   RDW 16.5 (H) 11.5 - 15.5 %   Platelets 293 150 - 400 K/uL   Neutrophils Relative % 77 %   Neutro Abs 9.4 (H) 1.7 - 7.7 K/uL   Lymphocytes Relative 12 %   Lymphs Abs 1.5 0.7 - 4.0 K/uL   Monocytes Relative 10 %   Monocytes Absolute 1.2 (H) 0.1 - 1.0 K/uL   Eosinophils Relative 1 %   Eosinophils  Absolute 0.1 0.0 - 0.7 K/uL   Basophils Relative 0 %   Basophils Absolute 0.0 0.0 - 0.1 K/uL  Culture, blood (Routine x 2)     Status: None (Preliminary result)   Collection Time: 06/09/16  7:30 PM  Result Value Ref Range   Specimen Description BLOOD RIGHT HAND    Special Requests IN PEDIATRIC BOTTLE 4CC    Culture NO GROWTH < 24 HOURS    Report Status PENDING   I-Stat CG4 Lactic Acid, ED     Status: None   Collection Time: 06/09/16  7:42 PM  Result Value Ref Range   Lactic Acid, Venous 1.20 0.5 - 1.9 mmol/L  I-Stat CG4 Lactic Acid, ED     Status: None   Collection Time: 06/09/16 11:11 PM  Result Value Ref Range   Lactic Acid, Venous 0.73 0.5 - 1.9 mmol/L  CBC     Status: Abnormal   Collection Time: 06/11/16  6:00 AM  Result Value Ref Range   WBC 11.5 (H) 4.0 - 10.5 K/uL   RBC 3.69 (L) 3.87 - 5.11 MIL/uL   Hemoglobin 8.7 (L) 12.0 - 15.0 g/dL   HCT 28.2 (L) 36.0 - 46.0 %   MCV 76.4 (L) 78.0 - 100.0 fL   MCH 23.6 (L) 26.0 - 34.0 pg   MCHC 30.9 30.0 - 36.0 g/dL   RDW 16.6 (H) 11.5 - 15.5 %   Platelets 257 150 - 400 K/uL  Basic metabolic panel     Status: Abnormal   Collection Time: 06/11/16  6:00 AM  Result Value Ref Range   Sodium 134 (L) 135 - 145 mmol/L   Potassium 3.1 (L) 3.5 - 5.1 mmol/L   Chloride 99 (L) 101 - 111 mmol/L   CO2 26 22 - 32 mmol/L   Glucose, Bld 138 (H) 65 - 99 mg/dL   BUN 13 6 - 20 mg/dL   Creatinine, Ser 0.90 0.44 - 1.00 mg/dL   Calcium 7.9 (L) 8.9 - 10.3 mg/dL   GFR calc non Af Amer >60 >60 mL/min   GFR calc Af Amer >60 >60 mL/min    Comment: (NOTE) The eGFR has been calculated using the CKD EPI equation. This calculation has not been validated in all clinical situations. eGFR's persistently <60 mL/min signify possible Chronic Kidney Disease.    Anion gap 9 5 - 15  Protime-INR     Status: Abnormal   Collection Time: 06/11/16  9:06 AM  Result Value Ref Range   Prothrombin Time 16.1 (H) 11.4 - 15.2 seconds   INR 1.28     Imaging: Dg Chest 2  View  Result Date: 06/09/2016 CLINICAL DATA:  Abdominal pain and fever. Hernia surgery 2 weeks ago. EXAM: CHEST  2 VIEW COMPARISON:  03/02/2016 FINDINGS: Right subclavian Port-A-Cath remains in place with tip overlying the lower SVC. The cardiomediastinal silhouette is within normal limits. There is no evidence of airspace consolidation, edema, pleural effusion, or pneumothorax. Thoracic spondylosis is noted. IMPRESSION: No active cardiopulmonary disease. Electronically Signed   By: Logan Bores M.D.   On: 06/09/2016 21:38   Ct Abdomen Pelvis W Contrast  Result Date: 06/09/2016 CLINICAL DATA:  65 year old female with abdominal pain and fever. History of hernia surgery 2 weeks ago. EXAM: CT ABDOMEN AND PELVIS WITH CONTRAST TECHNIQUE: Multidetector CT imaging of the abdomen and pelvis was performed using the standard protocol following bolus administration of intravenous contrast. CONTRAST:  174m ISOVUE-300 IOPAMIDOL (ISOVUE-300) INJECTION 61% COMPARISON:  Abdominal CT dated 05/09/2016 FINDINGS: Lower chest: The visualized lung bases are clear. No intra-abdominal free air or free fluid. Hepatobiliary: Diffuse fatty infiltration of the liver. No intrahepatic biliary ductal dilatation. The gallbladder is unremarkable. Pancreas: Unremarkable. No pancreatic ductal dilatation or surrounding inflammatory changes.  Spleen: Normal in size without focal abnormality. Adrenals/Urinary Tract: There is a 1.5 cm left adrenal hypodense nodule measuring less than 10 Hounsfield units most compatible with an adenoma. The right adrenal gland appears unremarkable. The kidneys, visualized ureters, and urinary bladder appear unremarkable. Stomach/Bowel: There postsurgical changes of gastric bypass with multiple anastomotic sutures in the small bowel. There is also partial resection of the sigmoid colon. No evidence of bowel obstruction or active inflammation. Normal appendix. Vascular/Lymphatic: There is mild moderate aortoiliac  atherosclerotic disease. The abdominal aorta and IVC are otherwise unremarkable. No portal venous gas identified. There is no adenopathy. Reproductive: Hysterectomy. Other: There is a midline vertical anterior pelvic wall incisional scar. Postsurgical changes of ventral hernia repair noted. There is a large fluid collection in the anterior abdominal wall along measuring approximately 21 x 5 cm in greatest axial dimension and 22 cm in the craniocaudal length similar to the prior CT. There may be minimal enhancement of the adjacent capsule concerning for an infected collection. A 5.0 x 7.2 cm collection noted containing air-fluid level in the right anterior abdominal wall lateral to the larger collection. This collection appears to be more superficial and likely in the subcutaneous tissues and superficial to the external oblique musculature. This collection is new since the prior CT. There is an apparent tract like communication extending from this collection to the deeper soft tissues and through the abdominal fascia and musculature and abutting the small bowel anastomosis in the right anterior abdominal wall. There is also apparent communication with the larger collection in the anterior abdominal wall. Although the smaller collection in the lateral right anterior abdominal wall may be extension of the larger collection in the midline, dehiscence of in the adjacent small bowel anastomosis is not entirely excluded. Further evaluation with CT with oral contrast is recommended. There is diffuse stranding of the subcutaneous soft tissues of the anterior abdominal wall. Musculoskeletal: Multilevel degenerative changes of the spine. No acute fracture. IMPRESSION: Persistent large loculated fluid in the anterior abdominal wall at the site of hernia repair concerning for infected fluid. There has been interval development of a smaller collection with air and fluid level in the right anterior abdominal wall laterally which  appears to communicate with the larger collection in the midline anterior abdomen. A tract like communication also extends from the smaller collection in the right lateral anterior abdominal wall and abuts the adjacent small bowel anastomosis. Dehiscence of the anastomotic suture as the cause of fluid collection is not entirely excluded. Further evaluation with CT with oral contrast is recommended. Electronically Signed   By: Anner Crete M.D.   On: 06/09/2016 23:29    Assessment:  1. Principal Problem: 2.   Abdominal wall abscess 3. Active Problems: 4.   Prediabetes 5.   Essential hypertension 6.   History of gastric bypass 7.   History of colon cancer 8.   History of cervical cancer 9.   Asthma 10.   Morbid obesity ([place]) 11.   Aortic atherosclerosis ([place]) 12.   Plan:  1. A-fib with RVR - converted to NSR overnight. Now on diltiazem 30 mg q6hr, coreg 9.375 mg BID and Flecainide 100 mg BID. Switch diltiazem to 120 mg LA daily this am. Check EKG tomorrow for change in QTc on increased dose flecainide.   Time Spent Directly with Patient:  15 minutes  Length of Stay:  LOS: 1 day   Pixie Casino, MD, [facility] Attending Cardiologist [place] 06/11/2016, 9:57  AM    

## 2016-06-11 NOTE — Progress Notes (Signed)
  Date: 06/11/2016  Patient name: Suzanne Macdonald  Medical record number: [id number]  Date of birth: [birth year]   I have seen and evaluated the patient. Case d/w residents in detail. I agree with findings and plan as documented in Dr. Fredrik Cove note.  Patient with persistent abd pain and low grade fevers. She is now s/p IR drainage of R sided abd wall abscess. Will c/w zosyn and vanco for now. Initial gram stain from abscess with gram + cocci and gram negative and positive rods. Blood cx with NGTD. Surgery f/u appreciated.   Urine cx with E. Coli but this should be treated with current abx regimen.   Patient now converted to NSR after being started on cardizem yesterday and having her flecainide dose increased. Check EKG in AM to monitor QTc given increased dose of flecainide   Aldine Contes, MD 06/11/2016, 6:23 PM

## 2016-06-11 NOTE — Procedures (Signed)
CT- guided drainage of right abdominal wall abscess.  Placed 10 french drain.  Removed approx. 20 ml of gas and 5 ml of yellow purulent fluid.  No immediate complication.  See full report in PACS.

## 2016-06-11 NOTE — Progress Notes (Addendum)
Subjective: Suzanne Macdonald is having abdominal pain today which is consistent with the day of admission. She denies chest pain, palpitations, or difficulty breathing.   Objective:  Vital signs in last 24 hours: Vitals:   06/10/16 2007 06/10/16 2332 06/11/16 0554 06/11/16 0739  BP:  (!) 127/51 (!) 131/52   Pulse:  75 73 75  Resp:  '18 17 18  '$ Temp:  99.1 F (37.3 C) 99.2 F (37.3 C)   TempSrc:  Oral Oral   SpO2: 99% 99% 98% 97%  Weight:      Height:       Physical Exam  Cardiovascular: Regular rhythm.  Tachycardia present.   No murmur heard. Pulmonary/Chest: She has no wheezes. She has no rales.  Abdominal: Bowel sounds are normal. She exhibits no distension. There is tenderness.  Right quadrant tenderness   Extremities: no calf tenderness  Medications: Infusions: . sodium chloride 100 mL/hr at 06/11/16 1147   Scheduled Medications: . atorvastatin  10 mg Oral Daily  . carvedilol  9.375 mg Oral BID WC  . diltiazem  120 mg Oral Daily  . enoxaparin (LOVENOX) injection  130 mg Subcutaneous Q12H  . flecainide  100 mg Oral BID  . hydrochlorothiazide  12.5 mg Oral Daily  . mometasone-formoterol  2 puff Inhalation BID  . pantoprazole  40 mg Oral Daily  . piperacillin-tazobactam (ZOSYN)  IV  3.375 g Intravenous Q8H  . polyethylene glycol  17 g Oral Daily  . potassium chloride  40 mEq Oral BID  . traZODone  100 mg Oral QHS  . vancomycin  1,000 mg Intravenous Q12H   PRN Medications: acetaminophen **OR** acetaminophen, albuterol, morphine injection, ondansetron **OR** ondansetron (ZOFRAN) IV, senna-docusate, sodium chloride flush  Assessment/Plan:   Abdominal wall abscess Recent ventral hernia repair who presented with fever 102.7 she was started on vancomycin and zosyn and CT abdomen showed abdominal wall fluid collections.  Interventional radiology will drain this abscess today and send fluid for cultures. She has been afebrile since admission with improving leukocyte  count.  - will continue vancomycin and zosyn for now and adjust based on culture data after her fluid collection is drained.  - blood cultures grown 10/21 - no growth to date  - urine cultures drawn 10/21 - no growth to date   Afib   Home medications include carvedilol 9.375 mg BID at home and Flecainide 50 mg BID at home. She went into afib yesterday and cardiology was consulted, they increased her to  flecainide 100 mg BID and started  cardizem 30 mg tablets q 6 hours and she returned to NSR. She is in NSR on exam today with rates in the 60s-80s.  - continue flecainide 100 mg BID and carvedilol 9.375 mg daily, cardizem extended release 120 mg tablets  -lovenox was held for IR procedure today, will continue this post procedure     Essential hypertension Blood pressure remains normotensive on his home medication is HCTZ 12.5 mg daily. She has had some hypokalemia during this admission, which could be related to HCTZ. Pharmacology discussed this with her and found that she has not tried ACD- I or ARB in the past.  - Will stop HCTZ and switch to Lisinopril 5 mg    Hypokalemia  Potassium has been low this admission. It was repleated yesterday but remained low today K 3.1 - stopped HCTZ and switched to lisinopril for BP control  - repleated with K-Dur 40 meq BID  - follow up repeat potassium this  evening   Candidal vulvovaginitis  She had a one week history of dysuria, and vaginal itching and irritation. Urinalysis on admission was not concerning for UTI. She received Diflucan   Hyperlipidemia  Continue home medication atorvastatin 10 mg daily.     Asthma Continue home medications proventil and dulera   GERD  Continue home medication omeprazole 40 mg daily   Dispo: Anticipated discharge in approximately 1-2 day(s).   LOS: 1 day   Ledell Noss, MD 06/11/2016, 1:57 PM Pager: [phone]

## 2016-06-11 NOTE — Progress Notes (Signed)
Pt. Comfortable on CT table

## 2016-06-11 NOTE — Progress Notes (Signed)
Subjective: So complains of pain in the right lower quadrant. Tender in this area. Incision from prior ventral hernia repair as healing nicely. She has been tolerating by mouth's well.  Objective: Vital signs in last 24 hours: Temp:  [99.1 F (37.3 C)-100.3 F (37.9 C)] 99.2 F (37.3 C) (10/23 0554) Pulse Rate:  [73-140] 75 (10/23 0739) Resp:  [17-18] 18 (10/23 0739) BP: (113-131)/(51-100) 131/52 (10/23 0554) SpO2:  [97 %-99 %] 97 % (10/23 0739) Last BM Date: 06/08/16 I/O= ? MAXIMUM TEMPERATURE 100.3 at 3 PM yesterday Heart rate down in the 70's now, and sinus rhythm Potassium 3.1 WBC 11.5, H/H down to 8.7/20 Intake/Output from previous day: 10/22 0701 - 10/23 0700 In: -  Out: 275 [Urine:275] Intake/Output this shift: Total I/O In: 600 [IV Piggyback:600] Out: 200 [Urine:200]  General appearance: alert, cooperative and no distress GI: Soft, complains of tenderness right lower quadrant abdominal wound from prior surgeries healing nicely. No distention, positive bowel sounds  Lab Results:   Recent Labs  06/09/16 1928 06/11/16 0600  WBC 12.2* 11.5*  HGB 10.3* 8.7*  HCT 33.3* 28.2*  PLT 293 257    BMET  Recent Labs  06/09/16 1928 06/11/16 0600  NA 135 134*  K 3.0* 3.1*  CL 100* 99*  CO2 25 26  GLUCOSE 125* 138*  BUN 12 13  CREATININE 0.78 0.90  CALCIUM 8.5* 7.9*   PT/INR  Recent Labs  06/11/16 0906  LABPROT 16.1*  INR 1.28     Recent Labs Lab 06/09/16 1928  AST 19  ALT 11*  ALKPHOS 72  BILITOT 1.1  PROT 6.0*  ALBUMIN 2.9*     Lipase     Component Value Date/Time   LIPASE 19 04/28/2016 1439     Studies/Results: Dg Chest 2 View  Result Date: 06/09/2016 CLINICAL DATA:  Abdominal pain and fever. Hernia surgery 2 weeks ago. EXAM: CHEST  2 VIEW COMPARISON:  03/02/2016 FINDINGS: Right subclavian Port-A-Cath remains in place with tip overlying the lower SVC. The cardiomediastinal silhouette is within normal limits. There is no evidence of  airspace consolidation, edema, pleural effusion, or pneumothorax. Thoracic spondylosis is noted. IMPRESSION: No active cardiopulmonary disease. Electronically Signed   By: Logan Bores M.D.   On: 06/09/2016 21:38   Ct Abdomen Pelvis W Contrast  Result Date: 06/09/2016 CLINICAL DATA:  65 year old female with abdominal pain and fever. History of hernia surgery 2 weeks ago. EXAM: CT ABDOMEN AND PELVIS WITH CONTRAST TECHNIQUE: Multidetector CT imaging of the abdomen and pelvis was performed using the standard protocol following bolus administration of intravenous contrast. CONTRAST:  139m ISOVUE-300 IOPAMIDOL (ISOVUE-300) INJECTION 61% COMPARISON:  Abdominal CT dated 05/09/2016 FINDINGS: Lower chest: The visualized lung bases are clear. No intra-abdominal free air or free fluid. Hepatobiliary: Diffuse fatty infiltration of the liver. No intrahepatic biliary ductal dilatation. The gallbladder is unremarkable. Pancreas: Unremarkable. No pancreatic ductal dilatation or surrounding inflammatory changes. Spleen: Normal in size without focal abnormality. Adrenals/Urinary Tract: There is a 1.5 cm left adrenal hypodense nodule measuring less than 10 Hounsfield units most compatible with an adenoma. The right adrenal gland appears unremarkable. The kidneys, visualized ureters, and urinary bladder appear unremarkable. Stomach/Bowel: There postsurgical changes of gastric bypass with multiple anastomotic sutures in the small bowel. There is also partial resection of the sigmoid colon. No evidence of bowel obstruction or active inflammation. Normal appendix. Vascular/Lymphatic: There is mild moderate aortoiliac atherosclerotic disease. The abdominal aorta and IVC are otherwise unremarkable. No portal venous gas identified. There is  no adenopathy. Reproductive: Hysterectomy. Other: There is a midline vertical anterior pelvic wall incisional scar. Postsurgical changes of ventral hernia repair noted. There is a large fluid  collection in the anterior abdominal wall along measuring approximately 21 x 5 cm in greatest axial dimension and 22 cm in the craniocaudal length similar to the prior CT. There may be minimal enhancement of the adjacent capsule concerning for an infected collection. A 5.0 x 7.2 cm collection noted containing air-fluid level in the right anterior abdominal wall lateral to the larger collection. This collection appears to be more superficial and likely in the subcutaneous tissues and superficial to the external oblique musculature. This collection is new since the prior CT. There is an apparent tract like communication extending from this collection to the deeper soft tissues and through the abdominal fascia and musculature and abutting the small bowel anastomosis in the right anterior abdominal wall. There is also apparent communication with the larger collection in the anterior abdominal wall. Although the smaller collection in the lateral right anterior abdominal wall may be extension of the larger collection in the midline, dehiscence of in the adjacent small bowel anastomosis is not entirely excluded. Further evaluation with CT with oral contrast is recommended. There is diffuse stranding of the subcutaneous soft tissues of the anterior abdominal wall. Musculoskeletal: Multilevel degenerative changes of the spine. No acute fracture. IMPRESSION: Persistent large loculated fluid in the anterior abdominal wall at the site of hernia repair concerning for infected fluid. There has been interval development of a smaller collection with air and fluid level in the right anterior abdominal wall laterally which appears to communicate with the larger collection in the midline anterior abdomen. A tract like communication also extends from the smaller collection in the right lateral anterior abdominal wall and abuts the adjacent small bowel anastomosis. Dehiscence of the anastomotic suture as the cause of fluid collection is  not entirely excluded. Further evaluation with CT with oral contrast is recommended. Electronically Signed   By: Anner Crete M.D.   On: 06/09/2016 23:29   Prior to Admission medications   Medication Sig Start Date End Date Taking? Authorizing Provider  acetaminophen (TYLENOL) 500 MG tablet Take 1,000 mg by mouth every 6 (six) hours as needed for mild pain or moderate pain.   Yes Historical Provider, MD  acetaminophen-codeine (TYLENOL #3) 300-30 MG tablet Take 1-2 tablets by mouth every 4 (four) hours as needed for moderate pain or severe pain. 05/06/16  Yes Bethany Molt, DO  albuterol (PROVENTIL HFA;VENTOLIN HFA) 108 (90 Base) MCG/ACT inhaler Inhale 1-2 puffs into the lungs every 6 (six) hours as needed for wheezing or shortness of breath.   Yes Historical Provider, MD  apixaban (ELIQUIS) 5 MG TABS tablet Take 1 tablet (5 mg total) by mouth 2 (two) times daily. 03/13/16  Yes Shela Leff, MD  atorvastatin (LIPITOR) 10 MG tablet take 1 tablet by mouth once daily 04/04/16  Yes Shela Leff, MD  budesonide-formoterol [facility]) 160-4.5 MCG/ACT inhaler Inhale 2 puffs into the lungs 2 (two) times daily. 01/24/16  Yes Milagros Loll, MD  Calcium-Phosphorus-Vitamin D (CALCIUM/VITAMIN D3/ADULT GUMMY PO) Take 2 each by mouth daily.   Yes Historical Provider, MD  carvedilol (COREG) 6.25 MG tablet Take 1.5 tablets (9.375 mg total) by mouth 2 (two) times daily. 04/27/16  Yes [name], MD  cholecalciferol (VITAMIN D) 1000 units tablet Take 2,000 Units by mouth daily.    Yes Historical Provider, MD  cyanocobalamin 500 MCG tablet Take 1,000 mcg  by mouth daily.    Yes Historical Provider, MD  flecainide (TAMBOCOR) 50 MG tablet Take 1 tablet (50 mg total) by mouth 2 (two) times daily. 04/27/16  Yes [name], MD  hydrochlorothiazide (HYDRODIURIL) 25 MG tablet Take 0.5 tablets (12.5 mg total) by mouth daily. 04/27/16  Yes [name], MD  Multiple Vitamins-Minerals (ALIVE WOMENS GUMMY) CHEW  Chew 2 each by mouth daily.   Yes Historical Provider, MD  omeprazole (PRILOSEC) 40 MG capsule Take 1 capsule (40 mg total) by mouth daily. 04/27/16  Yes [name], MD  polyethylene glycol (MIRALAX / GLYCOLAX) packet Take 17 g by mouth daily. 05/06/16  Yes Bethany Molt, DO  senna-docusate (SENOKOT-S) 8.6-50 MG tablet Take 1 tablet by mouth at bedtime as needed for mild constipation. 05/06/16  Yes Bethany Molt, DO  traZODone (DESYREL) 100 MG tablet Take 100 mg by mouth at bedtime. 04/22/16  Yes Historical Provider, MD  ondansetron (ZOFRAN-ODT) 8 MG disintegrating tablet Take 1 tablet (8 mg total) by mouth every 8 (eight) hours as needed for nausea or vomiting. Patient not taking: Reported on 06/09/2016 01/22/16   Davonna Belling, MD     Medications: . atorvastatin  10 mg Oral Daily  . carvedilol  9.375 mg Oral BID WC  . diltiazem  30 mg Oral Q6H  . enoxaparin (LOVENOX) injection  130 mg Subcutaneous Q12H  . flecainide  100 mg Oral BID  . hydrochlorothiazide  12.5 mg Oral Daily  . mometasone-formoterol  2 puff Inhalation BID  . pantoprazole  40 mg Oral Daily  . piperacillin-tazobactam (ZOSYN)  IV  3.375 g Intravenous Q8H  . polyethylene glycol  17 g Oral Daily  . potassium chloride  40 mEq Oral BID  . traZODone  100 mg Oral QHS  . vancomycin  1,000 mg Intravenous Q12H      no IV fluids currently listed  Hypertension Sleep apnea History of colon and cervical cancer   Assessment/Plan Abdominal wall abscess Status post ventral hernia repair with mesh 05/01/16 Dr. 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    LOS: 1 day    Suzanne Macdonald 06/11/2016 [id number]

## 2016-06-12 ENCOUNTER — Other Ambulatory Visit: Payer: Self-pay

## 2016-06-12 DIAGNOSIS — B9689 Other specified bacterial agents as the cause of diseases classified elsewhere: Secondary | ICD-10-CM

## 2016-06-12 DIAGNOSIS — B9620 Unspecified Escherichia coli [E. coli] as the cause of diseases classified elsewhere: Secondary | ICD-10-CM

## 2016-06-12 DIAGNOSIS — Z87891 Personal history of nicotine dependence: Secondary | ICD-10-CM

## 2016-06-12 DIAGNOSIS — Z9689 Presence of other specified functional implants: Secondary | ICD-10-CM

## 2016-06-12 DIAGNOSIS — R7303 Prediabetes: Secondary | ICD-10-CM

## 2016-06-12 DIAGNOSIS — E669 Obesity, unspecified: Secondary | ICD-10-CM

## 2016-06-12 DIAGNOSIS — Z91013 Allergy to seafood: Secondary | ICD-10-CM

## 2016-06-12 DIAGNOSIS — I700 Atherosclerosis of aorta: Secondary | ICD-10-CM

## 2016-06-12 DIAGNOSIS — Z85038 Personal history of other malignant neoplasm of large intestine: Secondary | ICD-10-CM

## 2016-06-12 DIAGNOSIS — Z9884 Bariatric surgery status: Secondary | ICD-10-CM

## 2016-06-12 DIAGNOSIS — N390 Urinary tract infection, site not specified: Secondary | ICD-10-CM

## 2016-06-12 DIAGNOSIS — Z8541 Personal history of malignant neoplasm of cervix uteri: Secondary | ICD-10-CM

## 2016-06-12 LAB — CBC
HCT: 28.5 % — ABNORMAL LOW (ref 36.0–46.0)
HEMOGLOBIN: 8.6 g/dL — AB (ref 12.0–15.0)
MCH: 23.2 pg — AB (ref 26.0–34.0)
MCHC: 30.2 g/dL (ref 30.0–36.0)
MCV: 77 fL — AB (ref 78.0–100.0)
PLATELETS: 283 10*3/uL (ref 150–400)
RBC: 3.7 MIL/uL — AB (ref 3.87–5.11)
RDW: 16.7 % — ABNORMAL HIGH (ref 11.5–15.5)
WBC: 8.2 10*3/uL (ref 4.0–10.5)

## 2016-06-12 LAB — BASIC METABOLIC PANEL
ANION GAP: 6 (ref 5–15)
BUN: 11 mg/dL (ref 6–20)
CALCIUM: 7.7 mg/dL — AB (ref 8.9–10.3)
CHLORIDE: 106 mmol/L (ref 101–111)
CO2: 26 mmol/L (ref 22–32)
Creatinine, Ser: 0.82 mg/dL (ref 0.44–1.00)
GFR calc non Af Amer: >60 mL/min (ref >60)
GLUCOSE: 110 mg/dL — AB (ref 65–99)
POTASSIUM: 3.5 mmol/L (ref 3.5–5.1)
Sodium: 138 mmol/L (ref 135–145)

## 2016-06-12 LAB — URINE CULTURE

## 2016-06-12 LAB — VANCOMYCIN, TROUGH: Vancomycin Tr: 15 ug/mL (ref 15–20)

## 2016-06-12 MED ORDER — AMPICILLIN-SULBACTAM SODIUM 3 (2-1) G IJ SOLR
3.0000 g | Freq: Four times a day (QID) | INTRAMUSCULAR | Status: DC
  Administered 2016-06-12 – 2016-06-14 (×7): 3 g via INTRAVENOUS
  Filled 2016-06-12 (×9): qty 3

## 2016-06-12 MED ORDER — CIPROFLOXACIN HCL 500 MG PO TABS: 500.0000 mg | ORAL_TABLET | Freq: Two times a day (BID) | ORAL | Status: DC

## 2016-06-12 MED ORDER — DOCUSATE SODIUM 100 MG PO CAPS
100.0000 mg | ORAL_CAPSULE | Freq: Two times a day (BID) | ORAL | Status: DC | PRN
  Administered 2016-06-12 – 2016-06-14 (×3): 100 mg via ORAL
  Filled 2016-06-12 (×3): qty 1

## 2016-06-12 MED ORDER — SENNOSIDES-DOCUSATE SODIUM 8.6-50 MG PO TABS: 1.0000 | ORAL_TABLET | Freq: Two times a day (BID) | ORAL | Status: DC | PRN

## 2016-06-12 MED ORDER — FLUCONAZOLE IN SODIUM CHLORIDE 400-0.9 MG/200ML-% IV SOLN
400.0000 mg | INTRAVENOUS | Status: DC
  Administered 2016-06-12 – 2016-06-14 (×3): 400 mg via INTRAVENOUS
  Filled 2016-06-12 (×3): qty 200

## 2016-06-12 MED ORDER — DILTIAZEM HCL ER COATED BEADS 120 MG PO CP24
120.0000 mg | ORAL_CAPSULE | Freq: Every day | ORAL | Status: DC
  Administered 2016-06-12 – 2016-06-14 (×3): 120 mg via ORAL
  Filled 2016-06-12 (×3): qty 1

## 2016-06-12 NOTE — Progress Notes (Signed)
Referring Physician(s): UMPNT,I  Supervising Physician: Arne Cleveland  Patient Status:  [facility] - In-pt  Chief Complaint: Right abdominal fluid collection   Subjective: Pt feeling better since drain placed yesterday; denies N/V    Allergies: Scallops [shellfish allergy]  Medications: Prior to Admission medications   Medication Sig Start Date End Date Taking? Authorizing Provider  acetaminophen (TYLENOL) 500 MG tablet Take 1,000 mg by mouth every 6 (six) hours as needed for mild pain or moderate pain.   Yes Historical Provider, MD  acetaminophen-codeine (TYLENOL #3) 300-30 MG tablet Take 1-2 tablets by mouth every 4 (four) hours as needed for moderate pain or severe pain. 05/06/16  Yes Bethany Molt, DO  albuterol (PROVENTIL HFA;VENTOLIN HFA) 108 (90 Base) MCG/ACT inhaler Inhale 1-2 puffs into the lungs every 6 (six) hours as needed for wheezing or shortness of breath.   Yes Historical Provider, MD  apixaban (ELIQUIS) 5 MG TABS tablet Take 1 tablet (5 mg total) by mouth 2 (two) times daily. 03/13/16  Yes Shela Leff, MD  atorvastatin (LIPITOR) 10 MG tablet take 1 tablet by mouth once daily 04/04/16  Yes Shela Leff, MD  budesonide-formoterol [facility]) 160-4.5 MCG/ACT inhaler Inhale 2 puffs into the lungs 2 (two) times daily. 01/24/16  Yes Milagros Loll, MD  Calcium-Phosphorus-Vitamin D (CALCIUM/VITAMIN D3/ADULT GUMMY PO) Take 2 each by mouth daily.   Yes Historical Provider, MD  carvedilol (COREG) 6.25 MG tablet Take 1.5 tablets (9.375 mg total) by mouth 2 (two) times daily. 04/27/16  Yes [name], MD  cholecalciferol (VITAMIN D) 1000 units tablet Take 2,000 Units by mouth daily.    Yes Historical Provider, MD  cyanocobalamin 500 MCG tablet Take 1,000 mcg by mouth daily.    Yes Historical Provider, MD  flecainide (TAMBOCOR) 50 MG tablet Take 1 tablet (50 mg total) by mouth 2 (two) times daily. 04/27/16  Yes [name], MD  hydrochlorothiazide (HYDRODIURIL) 25  MG tablet Take 0.5 tablets (12.5 mg total) by mouth daily. 04/27/16  Yes [name], MD  Multiple Vitamins-Minerals (ALIVE WOMENS GUMMY) CHEW Chew 2 each by mouth daily.   Yes Historical Provider, MD  omeprazole (PRILOSEC) 40 MG capsule Take 1 capsule (40 mg total) by mouth daily. 04/27/16  Yes [name], MD  polyethylene glycol (MIRALAX / GLYCOLAX) packet Take 17 g by mouth daily. 05/06/16  Yes Bethany Molt, DO  senna-docusate (SENOKOT-S) 8.6-50 MG tablet Take 1 tablet by mouth at bedtime as needed for mild constipation. 05/06/16  Yes Bethany Molt, DO  traZODone (DESYREL) 100 MG tablet Take 100 mg by mouth at bedtime. 04/22/16  Yes Historical Provider, MD  ondansetron (ZOFRAN-ODT) 8 MG disintegrating tablet Take 1 tablet (8 mg total) by mouth every 8 (eight) hours as needed for nausea or vomiting. Patient not taking: Reported on 06/09/2016 01/22/16   Davonna Belling, MD     Vital Signs: BP (!) 132/59 (BP Location: Left Arm)   Pulse 62   Temp 98.4 F (36.9 C) (Oral)   Resp 18   Ht '5\' 2"'$  (1.575 m)   Wt 285 lb 4.8 oz (129.4 kg)   SpO2 98%   BMI 52.18 kg/m   Physical Exam awake/alert; rt abd drain intact, dressing dry, site mildly tender, output 50 cc turbid, blood- tinged fluid  Imaging: Dg Chest 2 View  Result Date: 06/09/2016 CLINICAL DATA:  Abdominal pain and fever. Hernia surgery 2 weeks ago. EXAM: CHEST  2 VIEW COMPARISON:  03/02/2016 FINDINGS: Right subclavian Port-A-Cath remains in place with tip  overlying the lower SVC. The cardiomediastinal silhouette is within normal limits. There is no evidence of airspace consolidation, edema, pleural effusion, or pneumothorax. Thoracic spondylosis is noted. IMPRESSION: No active cardiopulmonary disease. Electronically Signed   By: Logan Bores M.D.   On: 06/09/2016 21:38   Ct Abdomen Pelvis W Contrast  Result Date: 06/09/2016 CLINICAL DATA:  65 year old female with abdominal pain and fever. History of hernia surgery 2 weeks ago. EXAM:  CT ABDOMEN AND PELVIS WITH CONTRAST TECHNIQUE: Multidetector CT imaging of the abdomen and pelvis was performed using the standard protocol following bolus administration of intravenous contrast. CONTRAST:  128m ISOVUE-300 IOPAMIDOL (ISOVUE-300) INJECTION 61% COMPARISON:  Abdominal CT dated 05/09/2016 FINDINGS: Lower chest: The visualized lung bases are clear. No intra-abdominal free air or free fluid. Hepatobiliary: Diffuse fatty infiltration of the liver. No intrahepatic biliary ductal dilatation. The gallbladder is unremarkable. Pancreas: Unremarkable. No pancreatic ductal dilatation or surrounding inflammatory changes. Spleen: Normal in size without focal abnormality. Adrenals/Urinary Tract: There is a 1.5 cm left adrenal hypodense nodule measuring less than 10 Hounsfield units most compatible with an adenoma. The right adrenal gland appears unremarkable. The kidneys, visualized ureters, and urinary bladder appear unremarkable. Stomach/Bowel: There postsurgical changes of gastric bypass with multiple anastomotic sutures in the small bowel. There is also partial resection of the sigmoid colon. No evidence of bowel obstruction or active inflammation. Normal appendix. Vascular/Lymphatic: There is mild moderate aortoiliac atherosclerotic disease. The abdominal aorta and IVC are otherwise unremarkable. No portal venous gas identified. There is no adenopathy. Reproductive: Hysterectomy. Other: There is a midline vertical anterior pelvic wall incisional scar. Postsurgical changes of ventral hernia repair noted. There is a large fluid collection in the anterior abdominal wall along measuring approximately 21 x 5 cm in greatest axial dimension and 22 cm in the craniocaudal length similar to the prior CT. There may be minimal enhancement of the adjacent capsule concerning for an infected collection. A 5.0 x 7.2 cm collection noted containing air-fluid level in the right anterior abdominal wall lateral to the larger  collection. This collection appears to be more superficial and likely in the subcutaneous tissues and superficial to the external oblique musculature. This collection is new since the prior CT. There is an apparent tract like communication extending from this collection to the deeper soft tissues and through the abdominal fascia and musculature and abutting the small bowel anastomosis in the right anterior abdominal wall. There is also apparent communication with the larger collection in the anterior abdominal wall. Although the smaller collection in the lateral right anterior abdominal wall may be extension of the larger collection in the midline, dehiscence of in the adjacent small bowel anastomosis is not entirely excluded. Further evaluation with CT with oral contrast is recommended. There is diffuse stranding of the subcutaneous soft tissues of the anterior abdominal wall. Musculoskeletal: Multilevel degenerative changes of the spine. No acute fracture. IMPRESSION: Persistent large loculated fluid in the anterior abdominal wall at the site of hernia repair concerning for infected fluid. There has been interval development of a smaller collection with air and fluid level in the right anterior abdominal wall laterally which appears to communicate with the larger collection in the midline anterior abdomen. A tract like communication also extends from the smaller collection in the right lateral anterior abdominal wall and abuts the adjacent small bowel anastomosis. Dehiscence of the anastomotic suture as the cause of fluid collection is not entirely excluded. Further evaluation with CT with oral contrast is recommended. Electronically Signed  By: Anner Crete M.D.   On: 06/09/2016 23:29   Ct Image Guided Drainage By Percutaneous Catheter  Result Date: 06/11/2016 INDICATION: 65 year old with history of ventral hernia repair secondary to an incarcerated ventral hernia. Patient is complaining of worsening  right abdominal pain. Recent CT demonstrates an air-fluid collection in the right abdominal subcutaneous tissues. Request for CT-guided aspiration and drainage. EXAM: CT GUIDED DRAINAGE OF SUBCUTANEOUS ABDOMINAL WALL ABSCESS MEDICATIONS: None ANESTHESIA/SEDATION: 1.0 mg IV Versed 50 mcg IV Fentanyl Moderate Sedation Time:  25 minutes The patient was continuously monitored during the procedure by the interventional radiology nurse under my direct supervision. COMPLICATIONS: None immediate. TECHNIQUE: Informed written consent was obtained from the patient after a thorough discussion of the procedural risks, benefits and alternatives. All questions were addressed. A timeout was performed prior to the initiation of the procedure. PROCEDURE: Patient was placed supine. CT images through the abdomen were obtained. The large air-fluid collection in the right lateral abdominal subcutaneous tissues was targeted. The right side of the abdomen was prepped with chlorhexidine and sterile field was created. Using CT guidance, 18 gauge needle was directed into this air-fluid collection. Approximately 20 mL of gas was aspirated. 5 mL of yellow purulent fluid was removed. A stiff Amplatz wire was advanced into the collection and the tract was dilated to accommodate a 10.2 [place] multipurpose drain. Catheter was attached to a suction bulb. Catheter was sutured to the skin. FINDINGS: Collection in the right lateral abdominal subcutaneous tissues. This collection is predominantly made of gas. Small amount yellow purulent fluid removed from this collection. Again noted is a large low-density collection along the anterior abdominal wall probably related to a postoperative seroma. The right lateral abdominal collection was completely decompressed following placement of the catheter. This collection does not appear to communicate with the larger adjacent anterior collection. IMPRESSION: Successful placement of CT-guided drain within the  right lateral abdominal subcutaneous collection. Small amount of purulent fluid was removed and sent for culture. Electronically Signed   By: Markus Daft M.D.   On: 06/11/2016 17:32    Labs:  CBC:  Recent Labs  05/09/16 0350 06/09/16 1928 06/11/16 0600 06/12/16 0600  WBC 8.0 12.2* 11.5* 8.2  HGB 9.7* 10.3* 8.7* 8.6*  HCT 30.9* 33.3* 28.2* 28.5*  PLT 278 293 257 283    COAGS:  Recent Labs  06/11/16 0906  INR 1.28    BMP:  Recent Labs  05/09/16 0350 06/09/16 1928 06/11/16 0600 06/12/16 0415  NA 138 135 134* 138  K 2.8* 3.0* 3.1* 3.5  CL 103 100* 99* 106  CO2 '23 25 26 26  '$ GLUCOSE 93 125* 138* 110*  BUN '7 12 13 11  '$ CALCIUM 8.1* 8.5* 7.9* 7.7*  CREATININE 0.77 0.78 0.90 0.82  GFRNONAA >60 >60 >60 >60  GFRAA >60 >60 >60 >60    LIVER FUNCTION TESTS:  Recent Labs  01/21/16 2325 01/24/16 0952 04/28/16 1439 06/09/16 1928  BILITOT 0.6 0.3 0.5 1.1  AST 33 35 43* 19  ALT 34 30 46 11*  ALKPHOS 88 94 67 72  PROT 6.2* 6.3 5.9* 6.0*  ALBUMIN 3.3* 3.9 3.5 2.9*    Assessment and Plan: S/p right abd fluid collection drainage 10/23; AF; WBC nl; hgb stable; creat ok; fluid cx's pend; cont current tx/drain flushes; check CT once output minimal  Electronically Signed: D. Rowe Robert 06/12/2016, 9:16 AM   I spent a total of 15 minutes at the the patient's bedside AND on the patient's hospital floor  or unit, greater than 50% of which was counseling/coordinating care for right abdominal drain    Patient ID: Suzanne Macdonald, female   DOB: 1950-10-15, 65 y.o.   MRN: [id number]

## 2016-06-12 NOTE — Progress Notes (Signed)
Subjective: [name] says her abdominal pain has improved significantly after drainage yesterday. She denies chills, shortness of breath, or chest pain.   Objective:  Vital signs in last 24 hours: Vitals:   06/11/16 2028 06/11/16 2217 06/12/16 0527 06/12/16 0832  BP:  (!) 120/55 (!) 132/59   Pulse:  70 61 62  Resp:  '20 18 18  '$ Temp:  98.3 F (36.8 C) 98.4 F (36.9 C)   TempSrc:  Oral Oral   SpO2: 98% 97% 97% 98%  Weight:      Height:       Physical Exam  Cardiovascular: Normal rate and regular rhythm.   Pulmonary/Chest: She has no wheezes. She has no rales.  Abdominal: Soft. Bowel sounds are normal. She exhibits no distension. There is tenderness. There is no guarding.  Extremities: no calf tenderness  Medications: Infusions:   Scheduled Medications: . atorvastatin  10 mg Oral Daily  . carvedilol  9.375 mg Oral BID WC  . diltiazem  120 mg Oral Daily  . enoxaparin (LOVENOX) injection  130 mg Subcutaneous Q12H  . flecainide  100 mg Oral BID  . lisinopril  5 mg Oral Daily  . mometasone-formoterol  2 puff Inhalation BID  . pantoprazole  40 mg Oral Daily  . piperacillin-tazobactam (ZOSYN)  IV  3.375 g Intravenous Q8H  . polyethylene glycol  17 g Oral Daily  . traZODone  100 mg Oral QHS  . vancomycin  1,000 mg Intravenous Q12H   PRN Medications: acetaminophen **OR** acetaminophen, albuterol, morphine injection, ondansetron **OR** ondansetron (ZOFRAN) IV, senna-docusate, sodium chloride flush  Assessment/Plan:   Abdominal wall abscess Yesterday she had 5 ml fluid and 20 ml of gas drained from her abdominal wall abscess- gram stain of this fluid showed that it was polymicrobial with the presence of yeast.  Abscess cultures and sensitivities are still pending, we will continue vancomycin and zosyn until culture data is complete. We have consulted infectious disease.  Leukocytosis has improved and she has been afebrile since the day of admission when she had Tmax  102.7.  - blood cultures grown 10/21 - no growth to date  - urine cultures drawn 10/21 - no growth to date     Paroxysmal atrial fibrillation (HCC) Home medications include carvedilol 9.375 mg BID at home and Flecainide 50 mg BID. She went into afib 10/22 increased flecainide 100 mg BID and started cardizem 30 mg tablets q 6 hours and she returned to NSR. She remains in NSR on exam today with rates in the 60s.  EKG today showed no QTc prolongation.  continue flecainide 100 mg BID and carvedilol 9.375 mg daily, cardizem extended release 120 mg tablets lovenox 130 mg sub q for prophylaxis  Cardiology is following, we appreciate their recommendations    Essential hypertension Switched to lisinopril 5 mg from home medication HCTZ 12.5 mg daily. She is normotensive today, will continue with lisinopril at this dose.   Hypokalemia  She had low K on admission which was minimally responsive to repletion with K-Dur. Yesterday HCTZ was stopped and today she has had a K 3.5.     Asthma Continue home medications proventil and dulera     Essential hypertension Switched to lisinopril 5 mg from home medication HCTZ 12.5 mg daily. She is normotensive today, will continue with lisinopril at this dose.   GERD  Continue home medication omeprazole 40 mg daily   Dispo: Anticipated discharge in approximately 2-4 day(s).   LOS: 2 days  Ledell Noss, MD 06/12/2016, 12:53 PM Pager: [phone]

## 2016-06-12 NOTE — Progress Notes (Signed)
Patient ID: Suzanne Macdonald, female   DOB: Jan 01, 1951, 65 y.o.   MRN: [id number]   LOS: 2 days   Subjective: Had felt better this morning but now feeling drained. Continues with RLQ pain.   Objective: Vital signs in last 24 hours: Temp:  [98.3 F (36.8 C)-98.4 F (36.9 C)] 98.4 F (36.9 C) (10/24 0527) Pulse Rate:  [61-71] 62 (10/24 0832) Resp:  [10-20] 18 (10/24 0832) BP: (120-150)/(51-70) 132/59 (10/24 0527) SpO2:  [97 %-100 %] 98 % (10/24 0832) Last BM Date: 06/07/16   Drain: 62m/insertion   Laboratory  CBC  Recent Labs  06/11/16 0600 06/12/16 0600  WBC 11.5* 8.2  HGB 8.7* 8.6*  HCT 28.2* 28.5*  PLT 257 283   BMET  Recent Labs  06/11/16 0600 06/12/16 0415  NA 134* 138  K 3.1* 3.5  CL 99* 106  CO2 26 26  GLUCOSE 138* 110*  BUN 13 11  CREATININE 0.90 0.82  CALCIUM 7.9* 7.7*    Physical Exam General appearance: alert and no distress Resp: clear to auscultation bilaterally Cardio: regular rate and rhythm GI: Soft, +BS, mod-to-severe RLQ TTP, JP bloody fluid   Assessment/Plan: Abdominal wall abscess Status post ventral hernia repair with mesh 05/01/16 Dr. BExcell SeltzerPrior history of gastric bypass and bowel resection. Body mass index is 52.18  UTI: Pan-sensitive E coli Atrial fibrillation, converted to sinus rhythm 06/10/16 -  on Eliquis at home Hypokalemia -- Improved Anemia -  Stable FEN:  Regular diet ID: Zosyn day 4, vancomycin day 3, multiple organisms seen in drain fluid. Afebrile, WBC improved to normal range DVT:  Lovenox 130 mg every 12 H   Plan: Await culture results, continue Zosyn/vanc, consider adding antifungal if pt worsens again.    MLisette Abu PA-C Pager: [phone]/24/2017

## 2016-06-12 NOTE — Progress Notes (Addendum)
DAILY PROGRESS NOTE  Subjective:  Maintaining sinus in the 50-60 range. BP fairly well controlled. QTc today was 469 msec.  Objective:  Temp:  [98.3 F (36.8 C)-98.4 F (36.9 C)] 98.4 F (36.9 C) (10/24 0527) Pulse Rate:  [61-71] 61 (10/24 0527) Resp:  [10-20] 18 (10/24 0527) BP: (120-150)/(51-70) 132/59 (10/24 0527) SpO2:  [97 %-100 %] 97 % (10/24 0527) Weight change:   Intake/Output from previous day: 10/23 0701 - 10/24 0700 In: 1743.3 [P.O.:220; I.V.:668.3; IV Piggyback:850] Out: 250 [Urine:200; Drains:50]  Intake/Output from this shift: No intake/output data recorded.  Medications: No current facility-administered medications on file prior to encounter.    Current Outpatient Prescriptions on File Prior to Encounter  Medication Sig Dispense Refill  . acetaminophen (TYLENOL) 500 MG tablet Take 1,000 mg by mouth every 6 (six) hours as needed for mild pain or moderate pain.    Marland Kitchen acetaminophen-codeine (TYLENOL #3) 300-30 MG tablet Take 1-2 tablets by mouth every 4 (four) hours as needed for moderate pain or severe pain. 60 tablet 0  . albuterol (PROVENTIL HFA;VENTOLIN HFA) 108 (90 Base) MCG/ACT inhaler Inhale 1-2 puffs into the lungs every 6 (six) hours as needed for wheezing or shortness of breath.    Marland Kitchen apixaban (ELIQUIS) 5 MG TABS tablet Take 1 tablet (5 mg total) by mouth 2 (two) times daily. 180 tablet 1  . atorvastatin (LIPITOR) 10 MG tablet take 1 tablet by mouth once daily 90 tablet 1  . budesonide-formoterol (SYMBICORT) 160-4.5 MCG/ACT inhaler Inhale 2 puffs into the lungs 2 (two) times daily. 1 Inhaler 1  . Calcium-Phosphorus-Vitamin D (CALCIUM/VITAMIN D3/ADULT GUMMY PO) Take 2 each by mouth daily.    . carvedilol (COREG) 6.25 MG tablet Take 1.5 tablets (9.375 mg total) by mouth 2 (two) times daily. 270 tablet 1  . cholecalciferol (VITAMIN D) 1000 units tablet Take 2,000 Units by mouth daily.     . cyanocobalamin 500 MCG tablet Take 1,000 mcg by mouth daily.       . flecainide (TAMBOCOR) 50 MG tablet Take 1 tablet (50 mg total) by mouth 2 (two) times daily. 180 tablet 1  . hydrochlorothiazide (HYDRODIURIL) 25 MG tablet Take 0.5 tablets (12.5 mg total) by mouth daily. 45 tablet 1  . Multiple Vitamins-Minerals (ALIVE WOMENS GUMMY) CHEW Chew 2 each by mouth daily.    Marland Kitchen omeprazole (PRILOSEC) 40 MG capsule Take 1 capsule (40 mg total) by mouth daily. 90 capsule 1  . polyethylene glycol (MIRALAX / GLYCOLAX) packet Take 17 g by mouth daily. 14 each 0  . senna-docusate (SENOKOT-S) 8.6-50 MG tablet Take 1 tablet by mouth at bedtime as needed for mild constipation. 30 tablet 0  . traZODone (DESYREL) 100 MG tablet Take 100 mg by mouth at bedtime.  0  . ondansetron (ZOFRAN-ODT) 8 MG disintegrating tablet Take 1 tablet (8 mg total) by mouth every 8 (eight) hours as needed for nausea or vomiting. (Patient not taking: Reported on 06/09/2016) 10 tablet 0    Physical Exam: General appearance: alert and no distress Lungs: clear to auscultation bilaterally Heart: regular rate and rhythm, S1, S2 normal, no murmur, click, rub or gallop Extremities: extremities normal, atraumatic, no cyanosis or edema Neurologic: Grossly normal  Lab Results: Results for orders placed or performed during the hospital encounter of 06/09/16 (from the past 48 hour(s))  CBC     Status: Abnormal   Collection Time: 06/11/16  6:00 AM  Result Value Ref Range   WBC 11.5 (H) 4.0 - 10.5  K/uL   RBC 3.69 (L) 3.87 - 5.11 MIL/uL   Hemoglobin 8.7 (L) 12.0 - 15.0 g/dL   HCT 28.2 (L) 36.0 - 46.0 %   MCV 76.4 (L) 78.0 - 100.0 fL   MCH 23.6 (L) 26.0 - 34.0 pg   MCHC 30.9 30.0 - 36.0 g/dL   RDW 16.6 (H) 11.5 - 15.5 %   Platelets 257 150 - 400 K/uL  Basic metabolic panel     Status: Abnormal   Collection Time: 06/11/16  6:00 AM  Result Value Ref Range   Sodium 134 (L) 135 - 145 mmol/L   Potassium 3.1 (L) 3.5 - 5.1 mmol/L   Chloride 99 (L) 101 - 111 mmol/L   CO2 26 22 - 32 mmol/L   Glucose, Bld 138  (H) 65 - 99 mg/dL   BUN 13 6 - 20 mg/dL   Creatinine, Ser 0.90 0.44 - 1.00 mg/dL   Calcium 7.9 (L) 8.9 - 10.3 mg/dL   GFR calc non Af Amer >60 >60 mL/min   GFR calc Af Amer >60 >60 mL/min    Comment: (NOTE) The eGFR has been calculated using the CKD EPI equation. This calculation has not been validated in all clinical situations. eGFR's persistently <60 mL/min signify possible Chronic Kidney Disease.    Anion gap 9 5 - 15  Magnesium     Status: None   Collection Time: 06/11/16  6:00 AM  Result Value Ref Range   Magnesium 1.7 1.7 - 2.4 mg/dL  Protime-INR     Status: Abnormal   Collection Time: 06/11/16  9:06 AM  Result Value Ref Range   Prothrombin Time 16.1 (H) 11.4 - 15.2 seconds   INR 1.28   Aerobic/Anaerobic Culture (surgical/deep wound)     Status: None (Preliminary result)   Collection Time: 06/11/16  4:03 PM  Result Value Ref Range   Specimen Description ABSCESS    Special Requests ABDOMINAL WALL    Gram Stain      ABUNDANT WBC PRESENT, PREDOMINANTLY PMN FEW GRAM POSITIVE COCCI IN PAIRS AND CHAINS FEW GRAM NEGATIVE RODS FEW GRAM POSITIVE RODS RARE BUDDING YEAST SEEN    Culture PENDING    Report Status PENDING   Basic metabolic panel     Status: Abnormal   Collection Time: 06/12/16  4:15 AM  Result Value Ref Range   Sodium 138 135 - 145 mmol/L   Potassium 3.5 3.5 - 5.1 mmol/L   Chloride 106 101 - 111 mmol/L   CO2 26 22 - 32 mmol/L   Glucose, Bld 110 (H) 65 - 99 mg/dL   BUN 11 6 - 20 mg/dL   Creatinine, Ser 0.82 0.44 - 1.00 mg/dL   Calcium 7.7 (L) 8.9 - 10.3 mg/dL   GFR calc non Af Amer >60 >60 mL/min   GFR calc Af Amer >60 >60 mL/min    Comment: (NOTE) The eGFR has been calculated using the CKD EPI equation. This calculation has not been validated in all clinical situations. eGFR's persistently <60 mL/min signify possible Chronic Kidney Disease.    Anion gap 6 5 - 15  CBC     Status: Abnormal   Collection Time: 06/12/16  6:00 AM  Result Value Ref Range     WBC 8.2 4.0 - 10.5 K/uL   RBC 3.70 (L) 3.87 - 5.11 MIL/uL   Hemoglobin 8.6 (L) 12.0 - 15.0 g/dL   HCT 28.5 (L) 36.0 - 46.0 %   MCV 77.0 (L) 78.0 - 100.0 fL  MCH 23.2 (L) 26.0 - 34.0 pg   MCHC 30.2 30.0 - 36.0 g/dL   RDW 16.7 (H) 11.5 - 15.5 %   Platelets 283 150 - 400 K/uL    Imaging: Ct Image Guided Drainage By Percutaneous Catheter  Result Date: 06/11/2016 INDICATION: 65 year old with history of ventral hernia repair secondary to an incarcerated ventral hernia. Patient is complaining of worsening right abdominal pain. Recent CT demonstrates an air-fluid collection in the right abdominal subcutaneous tissues. Request for CT-guided aspiration and drainage. EXAM: CT GUIDED DRAINAGE OF SUBCUTANEOUS ABDOMINAL WALL ABSCESS MEDICATIONS: None ANESTHESIA/SEDATION: 1.0 mg IV Versed 50 mcg IV Fentanyl Moderate Sedation Time:  25 minutes The patient was continuously monitored during the procedure by the interventional radiology nurse under my direct supervision. COMPLICATIONS: None immediate. TECHNIQUE: Informed written consent was obtained from the patient after a thorough discussion of the procedural risks, benefits and alternatives. All questions were addressed. A timeout was performed prior to the initiation of the procedure. PROCEDURE: Patient was placed supine. CT images through the abdomen were obtained. The large air-fluid collection in the right lateral abdominal subcutaneous tissues was targeted. The right side of the abdomen was prepped with chlorhexidine and sterile field was created. Using CT guidance, 18 gauge needle was directed into this air-fluid collection. Approximately 20 mL of gas was aspirated. 5 mL of yellow purulent fluid was removed. A stiff Amplatz wire was advanced into the collection and the tract was dilated to accommodate a 10.2 [place] multipurpose drain. Catheter was attached to a suction bulb. Catheter was sutured to the skin. FINDINGS: Collection in the right lateral  abdominal subcutaneous tissues. This collection is predominantly made of gas. Small amount yellow purulent fluid removed from this collection. Again noted is a large low-density collection along the anterior abdominal wall probably related to a postoperative seroma. The right lateral abdominal collection was completely decompressed following placement of the catheter. This collection does not appear to communicate with the larger adjacent anterior collection. IMPRESSION: Successful placement of CT-guided drain within the right lateral abdominal subcutaneous collection. Small amount of purulent fluid was removed and sent for culture. Electronically Signed   By: Markus Daft M.D.   On: 06/11/2016 17:32    Assessment:  Principal Problem:   Abdominal wall abscess Active Problems:   Prediabetes   Essential hypertension   History of gastric bypass   History of colon cancer   History of cervical cancer   Asthma   Morbid obesity ([place])   Aortic atherosclerosis ([place])   Plan:  1. Maintaining sinus with increased dose flecainide, on long-acting cardizem and metoprolol. QTc today is 469 msec. No medication changes at this point. RESTART ELIQUIS 5 MG BID PRIOR TO DISCHARGE WHEN NO FURTHER PROCEDURES ARE CONTEMPLATED. Follow-up with Dr. Oval Linsey after discharge.   Cardiology will sign-off. Call with questions.   Time Spent Directly with Patient:  15 minutes  Length of Stay:  LOS: 2 days   Pixie Casino, MD, [facility] Building Attending Cardiologist [place] 06/12/2016, 8:27 AM

## 2016-06-12 NOTE — Consult Note (Signed)
[name]  Total days of antibiotics 4        Day 4 Vancomycin        Day 4 Zosyn               Reason for Consult: Abdominal Wall Abscess    Referring Physician: Dr. Dareen Piano  Principal Problem:   Abdominal wall abscess Active Problems:   Prediabetes   Essential hypertension   History of gastric bypass   Paroxysmal atrial fibrillation (HCC)   History of colon cancer   History of cervical cancer   Asthma   Atrial fibrillation with tachycardic ventricular rate (HCC)   Morbid obesity ([place])   Aortic atherosclerosis (HCC)  HPI: Suzanne Macdonald is a 65 y.o. female with PMHx of incarcerated ventral hernia s/p repair in September 2017, HTN, asthma, pre-diabetes, Obesity, h/o gastric bypass, h/o of colon (s/p resection and chemo) and cervical cancer, and aortic atherosclerosis who presented to the ED on 10/21 with fever and abdominal pain. I actually admitted this patient on the Internal Medicine Service with Dr. Jari Favre. At that time, patient stated that she underwent surgical repair for an incarcerated hernia in September. She did well after surgery, but for the past 1 week she developed abdominal pain, fever, and chills. She admitted to nausea, but denied vomiting. Patient admitted that she hasn't had much of an appetite at home and has lost about 15 pounds in the last one month. Patient continued to have regular bowel movements and denied diarrhea. She admitted to dysuria and dark urine since her surgery along with increased urgency but no increased frequency. Patient saw her general surgeon 2 days prior to admission who recommended coming to the ED if fevers begin spiking and her symptoms worsened.   On admission, patient was febrile up to 102.7 with leukocytosis of 12.2. CT abdomen/pelvis showed a large persistent loculated abscess with possible tracts. Lactic acid 1.2>0.73. She was started on Vancomycin and Zosyn. On 10/23, patient went for CT guided drain  placement within the right lateral abdominal subcutaneous abscess and small amount of purulent fluid was removed and sent for culture. Culture was polymicrobial with rare yeast. BCx from 10/21 are NGTD and UCx was positive for E. Coli, sensitive to Zosyn. Patient has remained on Zosyn and Vancomycin for 4 days.  Patient was seen and examined. She continues to have abdominal pain which has improved slightly. She denies fever, chills, nausea, vomiting or diarrhea.   Past Medical History:  Diagnosis Date  . Asthma   . Atrial fibrillation ([place])   . Cervical cancer ([place]) 1972  . Colon cancer ([place])   . H/O gastric bypass   . Hyperlipidemia   . Hypertension   . Morbid obesity ([place]) 03/04/2016  . Sleep apnea     Allergies:  Allergies  Allergen Reactions  . Scallops [Shellfish Allergy] Hives    Current antibiotics: Vancomycin 10/21>>10/21 Zosyn 10/21 >>   MEDICATIONS: . ampicillin-sulbactam (UNASYN) IV  3 g Intravenous Q6H  . atorvastatin  10 mg Oral Daily  . carvedilol  9.375 mg Oral BID WC  . diltiazem  120 mg Oral Daily  . enoxaparin (LOVENOX) injection  130 mg Subcutaneous Q12H  . flecainide  100 mg Oral BID  . fluconazole (DIFLUCAN) IV  400 mg Intravenous Q24H  . lisinopril  5 mg Oral Daily  . mometasone-formoterol  2 puff Inhalation BID  . pantoprazole  40 mg Oral Daily  . polyethylene glycol  17 g Oral  Daily  . traZODone  100 mg Oral QHS    Social History  Substance Use Topics  . Smoking status: Former Smoker    Years: 0.20    Types: Cigarettes  . Smokeless tobacco: Never Used  . Alcohol use No     Comment: a beer every now and then    Family History  Problem Relation Age of Onset  . Heart disease Mother   . Diabetes Mother   . Heart disease Father   . Stroke Father   . Diabetes Father   . Diabetes Sister   . Thyroid disease Sister   . Early death Brother     Killed by drunk driver  . Heart attack Brother   . Thyroid disease Daughter     s/p  thyroidectomy  . Diabetes Sister   . Thyroid disease Sister   . Stroke Brother   . Drug abuse Brother   . Heart attack Brother     Review of Systems: A complete ROS was negative except as per HPI.   OBJECTIVE: Vitals:   06/11/16 2217 06/12/16 0527 06/12/16 0832 06/12/16 1300  BP: (!) 120/55 (!) 132/59  (!) 116/96  Pulse: 70 61 62 65  Resp: _0 Temp: 98.3 F (36.8 C) 98.4 F (36.9 C)  99.3 F (37.4 C)  TempSrc: Oral Oral  Oral  SpO2: 97% 97% 98% 98%  Weight:      Height:       General: Vital signs reviewed.  Patient is obese, in no acute distress and cooperative with exam.  Head: Normocephalic and atraumatic. Neck: Supple, trachea midline Cardiovascular: RRR, 2/6 systolic ejection murmur. Pulmonary/Chest: Scattered expiratory squeaks, no rales, or rhonchi. Abdominal: Soft, tender to palpation in right upper and lower quadrants, non-distended, BS +, drain in place. Extremities: Trace lower extremity edema bilaterally, pulses symmetric and intact bilaterally.  Skin: Warm, dry and intact. No rashes or erythema. Psychiatric: Normal mood and affect. speech and behavior is normal. Cognition and memory are normal.    LABS: Results for orders placed or performed during the hospital encounter of 06/09/16 (from the past 48 hour(s))  CBC     Status: Abnormal   Collection Time: 06/11/16  6:00 AM  Result Value Ref Range   WBC 11.5 (H) 4.0 - 10.5 K/uL   RBC 3.69 (L) 3.87 - 5.11 MIL/uL   Hemoglobin 8.7 (L) 12.0 - 15.0 g/dL   HCT 28.2 (L) 36.0 - 46.0 %   MCV 76.4 (L) 78.0 - 100.0 fL   MCH 23.6 (L) 26.0 - 34.0 pg   MCHC 30.9 30.0 - 36.0 g/dL   RDW 16.6 (H) 11.5 - 15.5 %   Platelets 257 150 - 400 K/uL  Basic metabolic panel     Status: Abnormal   Collection Time: 06/11/16  6:00 AM  Result Value Ref Range   Sodium 134 (L) 135 - 145 mmol/L   Potassium 3.1 (L) 3.5 - 5.1 mmol/L   Chloride 99 (L) 101 - 111 mmol/L   CO2 26 22 - 32 mmol/L   Glucose, Bld 138 (H) 65 - 99 mg/dL    BUN 13 6 - 20 mg/dL   Creatinine, Ser 0.90 0.44 - 1.00 mg/dL   Calcium 7.9 (L) 8.9 - 10.3 mg/dL   GFR calc non Af Amer >60 >60 mL/min   GFR calc Af Amer >60 >60 mL/min    Comment: (NOTE) The eGFR has been calculated using the CKD EPI equation. This calculation has  not been validated in all clinical situations. eGFR's persistently <60 mL/min signify possible Chronic Kidney Disease.    Anion gap 9 5 - 15  Magnesium     Status: None   Collection Time: 06/11/16  6:00 AM  Result Value Ref Range   Magnesium 1.7 1.7 - 2.4 mg/dL  Protime-INR     Status: Abnormal   Collection Time: 06/11/16  9:06 AM  Result Value Ref Range   Prothrombin Time 16.1 (H) 11.4 - 15.2 seconds   INR 1.28   Aerobic/Anaerobic Culture (surgical/deep wound)     Status: None (Preliminary result)   Collection Time: 06/11/16  4:03 PM  Result Value Ref Range   Specimen Description ABSCESS    Special Requests ABDOMINAL WALL    Gram Stain      ABUNDANT WBC PRESENT, PREDOMINANTLY PMN FEW GRAM POSITIVE COCCI IN PAIRS AND CHAINS FEW GRAM NEGATIVE RODS FEW GRAM POSITIVE RODS RARE BUDDING YEAST SEEN    Culture CULTURE REINCUBATED FOR BETTER GROWTH    Report Status PENDING   Basic metabolic panel     Status: Abnormal   Collection Time: 06/12/16  4:15 AM  Result Value Ref Range   Sodium 138 135 - 145 mmol/L   Potassium 3.5 3.5 - 5.1 mmol/L   Chloride 106 101 - 111 mmol/L   CO2 26 22 - 32 mmol/L   Glucose, Bld 110 (H) 65 - 99 mg/dL   BUN 11 6 - 20 mg/dL   Creatinine, Ser 0.82 0.44 - 1.00 mg/dL   Calcium 7.7 (L) 8.9 - 10.3 mg/dL   GFR calc non Af Amer >60 >60 mL/min   GFR calc Af Amer >60 >60 mL/min    Comment: (NOTE) The eGFR has been calculated using the CKD EPI equation. This calculation has not been validated in all clinical situations. eGFR's persistently <60 mL/min signify possible Chronic Kidney Disease.    Anion gap 6 5 - 15  CBC     Status: Abnormal   Collection Time: 06/12/16  6:00 AM  Result Value  Ref Range   WBC 8.2 4.0 - 10.5 K/uL   RBC 3.70 (L) 3.87 - 5.11 MIL/uL   Hemoglobin 8.6 (L) 12.0 - 15.0 g/dL   HCT 28.5 (L) 36.0 - 46.0 %   MCV 77.0 (L) 78.0 - 100.0 fL   MCH 23.2 (L) 26.0 - 34.0 pg   MCHC 30.2 30.0 - 36.0 g/dL   RDW 16.7 (H) 11.5 - 15.5 %   Platelets 283 150 - 400 K/uL  Vancomycin, trough     Status: None   Collection Time: 06/12/16  9:30 AM  Result Value Ref Range   Vancomycin Tr 15 15 - 20 ug/mL    MICRO: Surgical Cx 10/23: ABUNDANT WBC PRESENT, PREDOMINANTLY PMN  FEW GRAM POSITIVE COCCI IN PAIRS AND CHAINS  FEW GRAM NEGATIVE RODS  FEW GRAM POSITIVE RODS  RARE BUDDING YEAST SEEN  BCx 10/21 >> NGTD UCx 10/21 >> E. Coli  IMAGING: Ct Image Guided Drainage By Percutaneous Catheter  Result Date: 06/11/2016 INDICATION: 65 year old with history of ventral hernia repair secondary to an incarcerated ventral hernia. Patient is complaining of worsening right abdominal pain. Recent CT demonstrates an air-fluid collection in the right abdominal subcutaneous tissues. Request for CT-guided aspiration and drainage. EXAM: CT GUIDED DRAINAGE OF SUBCUTANEOUS ABDOMINAL WALL ABSCESS MEDICATIONS: None ANESTHESIA/SEDATION: 1.0 mg IV Versed 50 mcg IV Fentanyl Moderate Sedation Time:  25 minutes The patient was continuously monitored during the procedure by the interventional radiology  nurse under my direct supervision. COMPLICATIONS: None immediate. TECHNIQUE: Informed written consent was obtained from the patient after a thorough discussion of the procedural risks, benefits and alternatives. All questions were addressed. A timeout was performed prior to the initiation of the procedure. PROCEDURE: Patient was placed supine. CT images through the abdomen were obtained. The large air-fluid collection in the right lateral abdominal subcutaneous tissues was targeted. The right side of the abdomen was prepped with chlorhexidine and sterile field was created. Using CT guidance, 18 gauge needle was  directed into this air-fluid collection. Approximately 20 mL of gas was aspirated. 5 mL of yellow purulent fluid was removed. A stiff Amplatz wire was advanced into the collection and the tract was dilated to accommodate a 10.2 [place] multipurpose drain. Catheter was attached to a suction bulb. Catheter was sutured to the skin. FINDINGS: Collection in the right lateral abdominal subcutaneous tissues. This collection is predominantly made of gas. Small amount yellow purulent fluid removed from this collection. Again noted is a large low-density collection along the anterior abdominal wall probably related to a postoperative seroma. The right lateral abdominal collection was completely decompressed following placement of the catheter. This collection does not appear to communicate with the larger adjacent anterior collection. IMPRESSION: Successful placement of CT-guided drain within the right lateral abdominal subcutaneous collection. Small amount of purulent fluid was removed and sent for culture. Electronically Signed   By: Markus Daft M.D.   On: 06/11/2016 17:32    Assessment/Plan:   Ms. Curtiss is a 65 yo female with PMHx of incarcerated ventral hernia s/p repair in September 2017, HTN, asthma, pre-diabetes, obesity, h/o gastric bypass, h/o of colon and cervical cancer, and aortic atherosclerosis who presents with fever and abdominal pain.   Large Abdominal Wall Abscess s/p IR Drain Placement: Likely 2/2 to recent hernia repair. Drained by IR on 10/23. Afebrile since 10/21. Leukocytosis trending down. Abdominal pain persists, but decreased in frequency. Patient has been on vanc/zosyn since 10/21. Her BCx remain NGTD but surgical culture is polymicrobial with rare budding yeast, still in growth process. ID has been consulted for antibiotic recommendations. Given the large size of the fluid collection and polymicrobial growth with negative MRSA PCR screen, would opt to treat with Unasyn and Fluconazole at this  time. We can discontinue the Vancomycin. Patient will likely need IV antibiotics for 1-2 more weeks. She can receive these through her port instead of placing a PICC line if port is still patent. After discontinuation of IV antibiotics, patient can be transitioned to Augmentin.  -D/C Vancomycin and Ciprofloxacin -Add Unasyn and Fluconazole -Will need 1-2 weeks of IV antibiotics, but we will continue to follow her surgical culture growth over the next 1-2 days before she is discharged -Will need to follow with [facility]  UTI: UCx with E. Coli sensitive to Unasyn and Zosyn. Given her urinary complaints on admission, this may be a true UTI. Her symptoms are now resolved.  -Continue Unasyn as above  Martyn Malay, DO PGY-3 Internal Medicine Resident Pager # [phone] 06/12/2016 5:21 PM

## 2016-06-12 NOTE — Progress Notes (Addendum)
Pharmacy Antibiotic and Anticoagulation Note  Suzanne Macdonald is a 65 y.o. female admitted on 06/09/2016 with abd wall abscess.  Pharmacy consulted for vancomycin and zosyn dosing on 10/21. abdominal wall abscess s/p R abd fluid collection drainage on10/23; WBC 11.5>8.2, Afrebrile 10/21 CT (+) large loculated fluid in anterior abd wall at site hernia repair. Ucx > 100 k E coli - pansensitive; abscess cx with gram positive and negative rods and rare budding yeast.  ID may be consulted per chart note from infection prevention RN.  Vancomycin trough this morning = 15 mcg/ml which is therapeutic on 1 gm q12h.  Anticoag: full dose LMWH for afib while eliquis on hold. AM dose held 10/23 for IR procedure.  HG 8.6 - stable, pltc WNL. No bleeding reported.Cards plans to restart eliquis 5 mg bid prior to discharge when no further procedures are needed.   Plan:continue Vancomycin 1000 IV every 12 hours.  Goal trough 15-20 mcg/mL. Zosyn 3.375g IV q8h (4 hour infusion).  ? Add antifungal coverage Continue LMWH 1 mg/kg = 130 mg sq q12h until safe to resume home eliquis 5 mg po bid for afib  Height: '5\' 2"'$  (157.5 cm) Weight: 285 lb 4.8 oz (129.4 kg) IBW/kg (Calculated) : 50.1  Temp (24hrs), Avg:98.4 F (36.9 C), Min:98.3 F (36.8 C), Max:98.4 F (36.9 C)   Recent Labs Lab 06/09/16 1928 06/09/16 1942 06/09/16 2311 06/11/16 0600 06/12/16 0415 06/12/16 0600 06/12/16 0930  WBC 12.2*  --   --  11.5*  --  8.2  --   CREATININE 0.78  --   --  0.90 0.82  --   --   LATICACIDVEN  --  1.20 0.73  --   --   --   --   VANCOTROUGH  --   --   --   --   --   --  15    Estimated Creatinine Clearance: 88.3 mL/min (by C-G formula based on SCr of 0.82 mg/dL).    Allergies  Allergen Reactions  . Scallops [Shellfish Allergy] Hives   Antimicrobials this admission:  Vanc 10/21 >>  Zosyn 10/21 >>  Fluconazole 10/22 x 1 dose of 150 mg  Dose adjustments this admission:  10/24 VT = 15 at 0930 am on 1 gm  q12  Microbiology results:  10/21 BCx2>>ngtd 10/21 UCx:  > 100k E coli, pansensitive 10/23 abd wall abscess: few GPcocci, few GNR, few GPR, rare budding yeast seen  Eudelia Bunch, Pharm.D. [phone] 06/12/2016 11:45 AM

## 2016-06-12 NOTE — Progress Notes (Signed)
06/12/16 Dr. Carlyle Basques contacted for potential ID consult. Luther Hearing RN BSN Infection Prevention

## 2016-06-12 NOTE — Progress Notes (Signed)
  Date: 06/12/2016  Patient name: Suzanne Macdonald  Medical record number: [id number]  Date of birth: 12-02-1950   I have seen and evaluated the patient. Case d/w residents in detail. I agree with findings and plan as documented in Dr. Fredrik Cove note. Patient had abd wall abscess drained by IR yesterday. Patient with persistent R LQ tenderness on exam. Fluid cx shows gram +ve and gram -ve rods as well as gram + cocci. ID consulted. Appreciate any recommendations they may have. Was started empirically on zosyn and vancomycin. Blood cx with NGTD. Urine cx is + for E. Coli but she has no dysuria and may have asymptomatic bacteruria. I doubt taht this is the etiology of her symptoms.    Patient now in NSR after increased flecainide dose. Will need to resume eliquis if no surgical interventions are planned. C/w coreg and carvedilol, Cardio recommendations appreciated  Aldine Contes, MD 06/12/2016, 3:21 PM

## 2016-06-13 DIAGNOSIS — D509 Iron deficiency anemia, unspecified: Secondary | ICD-10-CM

## 2016-06-13 LAB — CBC
HCT: 27.3 % — ABNORMAL LOW (ref 36.0–46.0)
Hemoglobin: 8.2 g/dL — ABNORMAL LOW (ref 12.0–15.0)
MCH: 23.2 pg — ABNORMAL LOW (ref 26.0–34.0)
MCHC: 30 g/dL (ref 30.0–36.0)
MCV: 77.1 fL — ABNORMAL LOW (ref 78.0–100.0)
PLATELETS: 273 10*3/uL (ref 150–400)
RBC: 3.54 MIL/uL — ABNORMAL LOW (ref 3.87–5.11)
RDW: 16.8 % — AB (ref 11.5–15.5)
WBC: 6.3 10*3/uL (ref 4.0–10.5)

## 2016-06-13 NOTE — Care Management Note (Signed)
Case Management Note  Patient Details  Name: Suzanne Macdonald MRN: [id number] Date of Birth: November 19, 1950  Subjective/Objective:                    Action/Plan: Plan to discharge on IV ABX . Patient has been at home before on IV ABX . She has power port . Confirmed face sheet information with patient . Patient would like [place] [facility] and [facility].   Will need order for [facility] and prescriptions once determined.  Referral given to [facility] .  Expected Discharge Date:  06/13/16               Expected Discharge Plan:  [place]  In-House Referral:     Discharge planning Services  CM Consult  Post Acute Care Choice:    Choice offered to:  Patient  DME Arranged:    DME Agency:     HH Arranged:    [place] Agency:  [place]  Status of Service:  In process, will continue to follow  If discussed at Long Length of Stay Meetings, dates discussed:    Additional Comments:  Marilu Favre, RN 06/13/2016, 2:39 PM

## 2016-06-13 NOTE — Progress Notes (Signed)
[facility] for Infectious Disease    Date of Admission:  06/09/2016   Total days of antibiotics 5        Day 2 Unasyn        Day 2 Fluconazole          ID: Suzanne Macdonald is a 65 y.o. female with PMHx of incarcerated ventral hernia s/p repair in September 2017, HTN, asthma, pre-diabetes, Obesity, h/o gastric bypass, h/o of colon (s/p resection and chemo)and cervical cancer, and aortic atherosclerosis who presented to the ED on 10/21 with fever and abdominal pain and found to have a large abdominal wall abscess.  Principal Problem:   Abdominal wall abscess Active Problems:   Prediabetes   Essential hypertension   History of gastric bypass   Paroxysmal atrial fibrillation (HCC)   History of colon cancer   History of cervical cancer   Asthma   Atrial fibrillation with tachycardic ventricular rate (HCC)   Morbid obesity ([place])   Aortic atherosclerosis (HCC)  Subjective: Patient was seen and examined this morning. She admits to persistent abdominal pain, but denies fever, chills, nausea, vomiting or diarrhea.   Medications:  . ampicillin-sulbactam (UNASYN) IV  3 g Intravenous Q6H  . atorvastatin  10 mg Oral Daily  . carvedilol  9.375 mg Oral BID WC  . diltiazem  120 mg Oral Daily  . enoxaparin (LOVENOX) injection  130 mg Subcutaneous Q12H  . flecainide  100 mg Oral BID  . fluconazole (DIFLUCAN) IV  400 mg Intravenous Q24H  . lisinopril  5 mg Oral Daily  . mometasone-formoterol  2 puff Inhalation BID  . pantoprazole  40 mg Oral Daily  . polyethylene glycol  17 g Oral Daily  . traZODone  100 mg Oral QHS    Objective: Vitals:   06/12/16 2036 06/12/16 2129 06/13/16 0452 06/13/16 0918  BP: (!) 131/48  132/62   Pulse: 62  (!) 56   Resp: 18  18   Temp: 98.3 F (36.8 C)  97.7 F (36.5 C)   TempSrc: Oral  Oral   SpO2: 99% 98% 98% 98%  Weight:      Height:       General: Vital signs reviewed.  Patient is obese, in no acute distress and cooperative with exam.    Cardiovascular: RRR, 2/6 systolic ejection murmur. Pulmonary/Chest: Clear on auscultation. Abdominal: Soft, tender to palpation in right upper and lower quadrants, non-distended, BS +, drain in place. Extremities: Trace lower extremity edema bilaterally, pulses symmetric and intact bilaterally.   Lab Results  Recent Labs  06/11/16 0600 06/12/16 0415 06/12/16 0600 06/13/16 0506  WBC 11.5*  --  8.2 6.3  HGB 8.7*  --  8.6* 8.2*  HCT 28.2*  --  28.5* 27.3*  NA 134* 138  --   --   K 3.1* 3.5  --   --   CL 99* 106  --   --   CO2 26 26  --   --   BUN 13 11  --   --   CREATININE 0.90 0.82  --   --    Microbiology: Surgical Cx 10/23: ABUNDANT WBC PRESENT, PREDOMINANTLY PMN  FEW GRAM POSITIVE COCCI IN PAIRS AND CHAINS  FEW GRAM NEGATIVE RODS  FEW GRAM POSITIVE RODS  RARE BUDDING YEAST SEEN  BCx 10/21 >> NGTD UCx 10/21 >> E. Coli  Current antibiotics: Vancomycin 10/21>>10/21 Zosyn 10/21 >>10/24 Unasyn 10/24 >> Fluconazole 10/24>>  Studies/Results: Ct Image Guided Drainage By Percutaneous Catheter  Result Date: 06/11/2016 INDICATION: 65 year old with history of ventral hernia repair secondary to an incarcerated ventral hernia. Patient is complaining of worsening right abdominal pain. Recent CT demonstrates an air-fluid collection in the right abdominal subcutaneous tissues. Request for CT-guided aspiration and drainage. EXAM: CT GUIDED DRAINAGE OF SUBCUTANEOUS ABDOMINAL WALL ABSCESS MEDICATIONS: None ANESTHESIA/SEDATION: 1.0 mg IV Versed 50 mcg IV Fentanyl Moderate Sedation Time:  25 minutes The patient was continuously monitored during the procedure by the interventional radiology nurse under my direct supervision. COMPLICATIONS: None immediate. TECHNIQUE: Informed written consent was obtained from the patient after a thorough discussion of the procedural risks, benefits and alternatives. All questions were addressed. A timeout was performed prior to the initiation of the procedure.  PROCEDURE: Patient was placed supine. CT images through the abdomen were obtained. The large air-fluid collection in the right lateral abdominal subcutaneous tissues was targeted. The right side of the abdomen was prepped with chlorhexidine and sterile field was created. Using CT guidance, 18 gauge needle was directed into this air-fluid collection. Approximately 20 mL of gas was aspirated. 5 mL of yellow purulent fluid was removed. A stiff Amplatz wire was advanced into the collection and the tract was dilated to accommodate a 10.2 [place] multipurpose drain. Catheter was attached to a suction bulb. Catheter was sutured to the skin. FINDINGS: Collection in the right lateral abdominal subcutaneous tissues. This collection is predominantly made of gas. Small amount yellow purulent fluid removed from this collection. Again noted is a large low-density collection along the anterior abdominal wall probably related to a postoperative seroma. The right lateral abdominal collection was completely decompressed following placement of the catheter. This collection does not appear to communicate with the larger adjacent anterior collection. IMPRESSION: Successful placement of CT-guided drain within the right lateral abdominal subcutaneous collection. Small amount of purulent fluid was removed and sent for culture. Electronically Signed   By: Markus Daft M.D.   On: 06/11/2016 17:32    Assessment/Plan: [name] is a 65 yo female with PMHx of incarcerated ventral hernia s/p repair in September 2017, HTN, asthma, pre-diabetes, obesity, h/o gastric bypass, h/o of colon and cervical cancer, and aortic atherosclerosis who presents with fever and abdominal pain.   Large Abdominal Wall Abscess s/p IR Drain Placement: Likely 2/2 to recent hernia repair. Drained by IR on 10/23. Afebrile since 10/21. No leukocytosis. Her BCx remain NGTD but surgical culture is polymicrobial with rare budding yeast, re-incubated for better  growth. -Continue Unasyn and Fluconazole -Will need 1-2 weeks of IV antibiotics followed by oral antibiotics, but we will continue to follow her surgical culture growth over the next day before she is discharged -Surgical Cx >>Polymicrobial with rare yeast, re-incubated for better growth, await culture results to determine course of therapy -Will need repeat imaging as outpatient -Will need to follow with [place]. Coli UTI:  -Continue Unasyn as above  Martyn Malay, DO PGY-3 Internal Medicine Resident Pager # [phone] 06/13/2016 11:27 AM

## 2016-06-13 NOTE — Progress Notes (Signed)
Referring Physician(s): Chucky May  Supervising Physician: Marybelle Killings  Patient Status:  [facility] - In-pt  Chief Complaint:  Right abdominal fluid collection  Subjective:  Ms Greenly is sitting up in bed eating breakfast.  She states she feels much better.  She tells me she thinks the drainage is "clearing up"  Allergies: Scallops [shellfish allergy]  Medications: Prior to Admission medications   Medication Sig Start Date End Date Taking? Authorizing Provider  acetaminophen (TYLENOL) 500 MG tablet Take 1,000 mg by mouth every 6 (six) hours as needed for mild pain or moderate pain.   Yes Historical Provider, MD  acetaminophen-codeine (TYLENOL #3) 300-30 MG tablet Take 1-2 tablets by mouth every 4 (four) hours as needed for moderate pain or severe pain. 05/06/16  Yes Bethany Molt, DO  albuterol (PROVENTIL HFA;VENTOLIN HFA) 108 (90 Base) MCG/ACT inhaler Inhale 1-2 puffs into the lungs every 6 (six) hours as needed for wheezing or shortness of breath.   Yes Historical Provider, MD  apixaban (ELIQUIS) 5 MG TABS tablet Take 1 tablet (5 mg total) by mouth 2 (two) times daily. 03/13/16  Yes Shela Leff, MD  atorvastatin (LIPITOR) 10 MG tablet take 1 tablet by mouth once daily 04/04/16  Yes Shela Leff, MD  budesonide-formoterol [facility]) 160-4.5 MCG/ACT inhaler Inhale 2 puffs into the lungs 2 (two) times daily. 01/24/16  Yes Milagros Loll, MD  Calcium-Phosphorus-Vitamin D (CALCIUM/VITAMIN D3/ADULT GUMMY PO) Take 2 each by mouth daily.   Yes Historical Provider, MD  carvedilol (COREG) 6.25 MG tablet Take 1.5 tablets (9.375 mg total) by mouth 2 (two) times daily. 04/27/16  Yes [name], MD  cholecalciferol (VITAMIN D) 1000 units tablet Take 2,000 Units by mouth daily.    Yes Historical Provider, MD  cyanocobalamin 500 MCG tablet Take 1,000 mcg by mouth daily.    Yes Historical Provider, MD  flecainide (TAMBOCOR) 50 MG tablet Take 1 tablet (50 mg total) by mouth 2 (two) times  daily. 04/27/16  Yes [name], MD  hydrochlorothiazide (HYDRODIURIL) 25 MG tablet Take 0.5 tablets (12.5 mg total) by mouth daily. 04/27/16  Yes [name], MD  Multiple Vitamins-Minerals (ALIVE WOMENS GUMMY) CHEW Chew 2 each by mouth daily.   Yes Historical Provider, MD  omeprazole (PRILOSEC) 40 MG capsule Take 1 capsule (40 mg total) by mouth daily. 04/27/16  Yes [name], MD  polyethylene glycol (MIRALAX / GLYCOLAX) packet Take 17 g by mouth daily. 05/06/16  Yes Bethany Molt, DO  senna-docusate (SENOKOT-S) 8.6-50 MG tablet Take 1 tablet by mouth at bedtime as needed for mild constipation. 05/06/16  Yes Bethany Molt, DO  traZODone (DESYREL) 100 MG tablet Take 100 mg by mouth at bedtime. 04/22/16  Yes Historical Provider, MD  ondansetron (ZOFRAN-ODT) 8 MG disintegrating tablet Take 1 tablet (8 mg total) by mouth every 8 (eight) hours as needed for nausea or vomiting. Patient not taking: Reported on 06/09/2016 01/22/16   Davonna Belling, MD     Vital Signs: BP 132/62 (BP Location: Right Arm)   Pulse (!) 56   Temp 97.7 F (36.5 C) (Oral)   Resp 18   Ht '5\' 2"'$  (1.575 m)   Wt 285 lb 4.8 oz (129.4 kg)   SpO2 98%   BMI 52.18 kg/m   Physical Exam  Awake and alert NAD Drain in place, flushes easily. ~40 ml output, ~10 ml serosanguinous drainage in bulb  Imaging: Dg Chest 2 View  Result Date: 06/09/2016 CLINICAL DATA:  Abdominal pain and fever. Hernia surgery  2 weeks ago. EXAM: CHEST  2 VIEW COMPARISON:  03/02/2016 FINDINGS: Right subclavian Port-A-Cath remains in place with tip overlying the lower SVC. The cardiomediastinal silhouette is within normal limits. There is no evidence of airspace consolidation, edema, pleural effusion, or pneumothorax. Thoracic spondylosis is noted. IMPRESSION: No active cardiopulmonary disease. Electronically Signed   By: Logan Bores M.D.   On: 06/09/2016 21:38   Ct Abdomen Pelvis W Contrast  Result Date: 06/09/2016 CLINICAL DATA:  65 year old  female with abdominal pain and fever. History of hernia surgery 2 weeks ago. EXAM: CT ABDOMEN AND PELVIS WITH CONTRAST TECHNIQUE: Multidetector CT imaging of the abdomen and pelvis was performed using the standard protocol following bolus administration of intravenous contrast. CONTRAST:  192m ISOVUE-300 IOPAMIDOL (ISOVUE-300) INJECTION 61% COMPARISON:  Abdominal CT dated 05/09/2016 FINDINGS: Lower chest: The visualized lung bases are clear. No intra-abdominal free air or free fluid. Hepatobiliary: Diffuse fatty infiltration of the liver. No intrahepatic biliary ductal dilatation. The gallbladder is unremarkable. Pancreas: Unremarkable. No pancreatic ductal dilatation or surrounding inflammatory changes. Spleen: Normal in size without focal abnormality. Adrenals/Urinary Tract: There is a 1.5 cm left adrenal hypodense nodule measuring less than 10 Hounsfield units most compatible with an adenoma. The right adrenal gland appears unremarkable. The kidneys, visualized ureters, and urinary bladder appear unremarkable. Stomach/Bowel: There postsurgical changes of gastric bypass with multiple anastomotic sutures in the small bowel. There is also partial resection of the sigmoid colon. No evidence of bowel obstruction or active inflammation. Normal appendix. Vascular/Lymphatic: There is mild moderate aortoiliac atherosclerotic disease. The abdominal aorta and IVC are otherwise unremarkable. No portal venous gas identified. There is no adenopathy. Reproductive: Hysterectomy. Other: There is a midline vertical anterior pelvic wall incisional scar. Postsurgical changes of ventral hernia repair noted. There is a large fluid collection in the anterior abdominal wall along measuring approximately 21 x 5 cm in greatest axial dimension and 22 cm in the craniocaudal length similar to the prior CT. There may be minimal enhancement of the adjacent capsule concerning for an infected collection. A 5.0 x 7.2 cm collection noted  containing air-fluid level in the right anterior abdominal wall lateral to the larger collection. This collection appears to be more superficial and likely in the subcutaneous tissues and superficial to the external oblique musculature. This collection is new since the prior CT. There is an apparent tract like communication extending from this collection to the deeper soft tissues and through the abdominal fascia and musculature and abutting the small bowel anastomosis in the right anterior abdominal wall. There is also apparent communication with the larger collection in the anterior abdominal wall. Although the smaller collection in the lateral right anterior abdominal wall may be extension of the larger collection in the midline, dehiscence of in the adjacent small bowel anastomosis is not entirely excluded. Further evaluation with CT with oral contrast is recommended. There is diffuse stranding of the subcutaneous soft tissues of the anterior abdominal wall. Musculoskeletal: Multilevel degenerative changes of the spine. No acute fracture. IMPRESSION: Persistent large loculated fluid in the anterior abdominal wall at the site of hernia repair concerning for infected fluid. There has been interval development of a smaller collection with air and fluid level in the right anterior abdominal wall laterally which appears to communicate with the larger collection in the midline anterior abdomen. A tract like communication also extends from the smaller collection in the right lateral anterior abdominal wall and abuts the adjacent small bowel anastomosis. Dehiscence of the anastomotic suture as the  cause of fluid collection is not entirely excluded. Further evaluation with CT with oral contrast is recommended. Electronically Signed   By: Anner Crete M.D.   On: 06/09/2016 23:29   Ct Image Guided Drainage By Percutaneous Catheter  Result Date: 06/11/2016 INDICATION: 65 year old with history of ventral hernia  repair secondary to an incarcerated ventral hernia. Patient is complaining of worsening right abdominal pain. Recent CT demonstrates an air-fluid collection in the right abdominal subcutaneous tissues. Request for CT-guided aspiration and drainage. EXAM: CT GUIDED DRAINAGE OF SUBCUTANEOUS ABDOMINAL WALL ABSCESS MEDICATIONS: None ANESTHESIA/SEDATION: 1.0 mg IV Versed 50 mcg IV Fentanyl Moderate Sedation Time:  25 minutes The patient was continuously monitored during the procedure by the interventional radiology nurse under my direct supervision. COMPLICATIONS: None immediate. TECHNIQUE: Informed written consent was obtained from the patient after a thorough discussion of the procedural risks, benefits and alternatives. All questions were addressed. A timeout was performed prior to the initiation of the procedure. PROCEDURE: Patient was placed supine. CT images through the abdomen were obtained. The large air-fluid collection in the right lateral abdominal subcutaneous tissues was targeted. The right side of the abdomen was prepped with chlorhexidine and sterile field was created. Using CT guidance, 18 gauge needle was directed into this air-fluid collection. Approximately 20 mL of gas was aspirated. 5 mL of yellow purulent fluid was removed. A stiff Amplatz wire was advanced into the collection and the tract was dilated to accommodate a 10.2 [place] multipurpose drain. Catheter was attached to a suction bulb. Catheter was sutured to the skin. FINDINGS: Collection in the right lateral abdominal subcutaneous tissues. This collection is predominantly made of gas. Small amount yellow purulent fluid removed from this collection. Again noted is a large low-density collection along the anterior abdominal wall probably related to a postoperative seroma. The right lateral abdominal collection was completely decompressed following placement of the catheter. This collection does not appear to communicate with the larger adjacent  anterior collection. IMPRESSION: Successful placement of CT-guided drain within the right lateral abdominal subcutaneous collection. Small amount of purulent fluid was removed and sent for culture. Electronically Signed   By: Markus Daft M.D.   On: 06/11/2016 17:32    Labs:  CBC:  Recent Labs  06/09/16 1928 06/11/16 0600 06/12/16 0600 06/13/16 0506  WBC 12.2* 11.5* 8.2 6.3  HGB 10.3* 8.7* 8.6* 8.2*  HCT 33.3* 28.2* 28.5* 27.3*  PLT 293 257 283 273    COAGS:  Recent Labs  06/11/16 0906  INR 1.28    BMP:  Recent Labs  05/09/16 0350 06/09/16 1928 06/11/16 0600 06/12/16 0415  NA 138 135 134* 138  K 2.8* 3.0* 3.1* 3.5  CL 103 100* 99* 106  CO2 '23 25 26 26  '$ GLUCOSE 93 125* 138* 110*  BUN '7 12 13 11  '$ CALCIUM 8.1* 8.5* 7.9* 7.7*  CREATININE 0.77 0.78 0.90 0.82  GFRNONAA >60 >60 >60 >60  GFRAA >60 >60 >60 >60    LIVER FUNCTION TESTS:  Recent Labs  01/21/16 2325 01/24/16 0952 04/28/16 1439 06/09/16 1928  BILITOT 0.6 0.3 0.5 1.1  AST 33 35 43* 19  ALT 34 30 46 11*  ALKPHOS 88 94 67 72  PROT 6.2* 6.3 5.9* 6.0*  ALBUMIN 3.3* 3.9 3.5 2.9*    Assessment and Plan:  Rt Abdominal fluid collection  S/P drain placement 10/23 by Dr. Anselm Pancoast  Continue flushes and routine drain care.   Repeat CT scan when output diminishes.  Electronically Signed: Murrell Redden  PA-C 06/13/2016, 9:49 AM   I spent a total of 15 Minutes at the the patient's bedside AND on the patient's hospital floor or unit, greater than 50% of which was counseling/coordinating care for c/u after drain placement

## 2016-06-13 NOTE — Progress Notes (Addendum)
Subjective: [name] says her abdominal pain has improved significantly since admission. She denies any new concerns or complaints.   Objective:  Vital signs in last 24 hours: Vitals:   06/12/16 2129 06/13/16 0452 06/13/16 0918 06/13/16 1523  BP:  132/62  (!) 124/54  Pulse:  (!) 56  62  Resp:  18  18  Temp:  97.7 F (36.5 C)  97.7 F (36.5 C)  TempSrc:  Oral  Oral  SpO2: 98% 98% 98% 98%  Weight:      Height:       Physical Exam  Constitutional: No distress.  Cardiovascular: Normal rate and regular rhythm.   No murmur heard. Pulmonary/Chest: Effort normal.  Abdominal: Soft. Bowel sounds are normal. She exhibits no distension. There is tenderness.  Extremities: no calf tenderness, no peripheral edema   Medications: Infusions:   Scheduled Medications: . ampicillin-sulbactam (UNASYN) IV  3 g Intravenous Q6H  . atorvastatin  10 mg Oral Daily  . carvedilol  9.375 mg Oral BID WC  . diltiazem  120 mg Oral Daily  . enoxaparin (LOVENOX) injection  130 mg Subcutaneous Q12H  . flecainide  100 mg Oral BID  . fluconazole (DIFLUCAN) IV  400 mg Intravenous Q24H  . lisinopril  5 mg Oral Daily  . mometasone-formoterol  2 puff Inhalation BID  . pantoprazole  40 mg Oral Daily  . polyethylene glycol  17 g Oral Daily  . traZODone  100 mg Oral QHS   PRN Medications: acetaminophen **OR** acetaminophen, albuterol, docusate sodium, morphine injection, ondansetron **OR** ondansetron (ZOFRAN) IV, sodium chloride flush  Assessment/Plan:   Abdominal wall abscess 10/23 she had 5 ml fluid and 20 ml of gas drained from her abdominal wall abscess- gram stain of this fluid showed gram positive cocci, gram positive rods, gram negative rods, and yeast, cultures revealed pan sensitive e.coli and the sample was re incubated. The cause of this abscess due to e.coli is unclear but we have some concern that it may be related to some transfer of gut bacteria from her colon anastamosis. She will  have a surgery follow up scheduled for imaging after this abscess has resolved and the drain has been removed.  Abscess cultures and sensitivities are still pending. She received 4 days of vancomycin and zosyn. Infectious disease was consulted and recommended switching to unasyn and fluconazole, which was started 10/24. IV antibiotics will be continued for 2 weeks then she will be switched to oral augmentin. We appreciate ID recommendations.  Leukocytosis has improved and she has been afebrile since the day of admission when she had Tmax 102.7.  - blood cultures grown 10/21 - no growth to date  - urine cultures drawn 10/21 - e.coli, this is asymptomatic and will be covered with the unasyn     Paroxysmal atrial fibrillation (HCC) Home medications include carvedilol 9.375 mg BID at home and Flecainide 50 mg BID. She went into afib 10/22, cardiology was consulted and increasedflecainide 100 mg BID and startedcardizem 30 mg tablets q 6 hoursand she returned to NSR. She remains in NSR on exam today with rates in the 60s. EKG 10/22 showed QT 486 Will repeat her EKG today to evaluate her QT given that we have restarted another QT prolonging mediation- fluconazole.  continue flecainide 100 mg BID and carvedilol 9.375 mg daily, cardizem extended release 120 mg tablets She is on lovenox 130 mg sub q for prophylaxis, will restart eliquis 5 mg BID prior to discharge Cardiology has signed off,  follow up with Suzanne Macdonald will be arranged at the time of discharge.     Essential hypertension Switched to lisinopril 5 mg from home medication HCTZ 12.5 mg. Hypokalemia has resolved. She is normotensive today, will continue with lisinopril at this dose.   Microcytic anemia  Hemoglobin has been stable at 8, she remains asymptomatic.     Asthma Continue home medications proventil and dulera    GERD  Continue home medication omeprazole 40 mg daily   Dispo: Anticipated discharge in approximately 1-2 day(s).    LOS: 3 days   Suzanne Noss, MD 06/13/2016, 3:37 PM Pager: [phone]

## 2016-06-13 NOTE — Progress Notes (Signed)
[facility]  Suzanne Macdonald is a new pt with [facility] this hospital admission  [facility] will provide a HHRN and Home Infusion Pharmacy team for home IV Unasyn upon DC. Reviewed POC for home IV ABX, set up and administration, and pt feels her husband will be able to administer IV ABX at home.  [facility] is prepared to support DC home when ordered as early as tomorrow.  If patient discharges after hours, please call [phone].   Larry Sierras 06/13/2016, 5:55 PM

## 2016-06-13 NOTE — Progress Notes (Signed)
  Date: 06/13/2016  Patient name: Suzanne Macdonald  Medical record number: [id number]  Date of birth: [birth year]   I have seen and evaluated the patient. Case d/w residents in detail. I agree with findings and plan as documented in Dr. Fredrik Cove note.  Patient feels better. Still with persistent R LQ pain at drain site but improved. No fevers. She is now s/p drain placement at R LQ abscess site. Abscess cx growing E. Coli and noted to have rare yeast and some gram + cocci. Will f/u cultures. ID f/u appreciated. Will c/w IV abx for 2 weeks and may transition her to PO abx after this.  It is uncertain why she is growing E. Coli in an abd wall abscess. I suspect that the etiology is gut pathogens and she may have a fistula connecting the abd wall to her bowel anastamosis on her initial CT. Once drain is removed as an outpatient she will need another CT abd for further eval. Will defer to surgery as to further management as they will follow her as an outpatient Patient remains in NSR. Will c/w flecainide, cardizem and carvedilol for her pAfib. Will restart eliquis today. Possible d/c in AM   Aldine Contes, MD 06/13/2016, 5:27 PM

## 2016-06-13 NOTE — Progress Notes (Signed)
Patient ID: Suzanne Macdonald, female   DOB: November 14, 1950, 65 y.o.   MRN: [id number]   LOS: 3 days   Subjective: Pain a little worse today but otherwise no change   Objective: Vital signs in last 24 hours: Temp:  [97.7 F (36.5 C)-99.3 F (37.4 C)] 97.7 F (36.5 C) (10/25 0452) Pulse Rate:  [56-65] 56 (10/25 0452) Resp:  [18] 18 (10/25 0452) BP: (116-132)/(48-96) 132/62 (10/25 0452) SpO2:  [98 %-99 %] 98 % (10/25 0918) Last BM Date: 06/09/16   JP: 22m/24h   Laboratory  CBC  Recent Labs  06/12/16 0600 06/13/16 0506  WBC 8.2 6.3  HGB 8.6* 8.2*  HCT 28.5* 27.3*  PLT 283 273    Physical Exam General appearance: alert and no distress Resp: clear to auscultation bilaterally Cardio: regular rate and rhythm GI: Soft, +BS, mod-to-severe TTP RLQ   Assessment/Plan: Abdominal wall abscess Status post ventral hernia repair with mesh 05/01/16 Dr. BExcell SeltzerPrior history of gastric bypass and bowel resection. Body mass index is 52.18  UTI: Pan-sensitive E coli Atrial fibrillation, converted to sinus rhythm 06/10/16 - on Eliquis at home Hypokalemia -- Improved Anemia -Stable FEN: Regular diet IHE:RDEYCday 4, vancomycin day 3, changed to Unasyn per ID, plan at least a couple of weeks of IV abx, culture now growing E coli. Afebrile, WBC continues in normal range DVT: Lovenox 130 mg every 12 H   Plan: Can return home from surgical standpoint with drain in place and plan from ID. We will see her in follow-up in a couple of weeks (appt made and in D/C tab). We will continue to follow while she is here.    MLisette Abu PA-C Pager: [phone] 06/13/2016

## 2016-06-14 LAB — CBC
HEMATOCRIT: 30 % — AB (ref 36.0–46.0)
HEMOGLOBIN: 8.9 g/dL — AB (ref 12.0–15.0)
MCH: 22.9 pg — AB (ref 26.0–34.0)
MCHC: 29.7 g/dL — AB (ref 30.0–36.0)
MCV: 77.1 fL — AB (ref 78.0–100.0)
Platelets: 326 10*3/uL (ref 150–400)
RBC: 3.89 MIL/uL (ref 3.87–5.11)
RDW: 17 % — ABNORMAL HIGH (ref 11.5–15.5)
WBC: 8.6 10*3/uL (ref 4.0–10.5)

## 2016-06-14 LAB — C-REACTIVE PROTEIN: CRP: 4.7 mg/dL — ABNORMAL HIGH (ref <1.0)

## 2016-06-14 LAB — CULTURE, BLOOD (ROUTINE X 2)
Culture: NO GROWTH
Culture: NO GROWTH

## 2016-06-14 LAB — SEDIMENTATION RATE: SED RATE: 74 mm/h — AB (ref 0–22)

## 2016-06-14 MED ORDER — APIXABAN 5 MG PO TABS
5.0000 mg | ORAL_TABLET | Freq: Two times a day (BID) | ORAL | Status: DC
  Administered 2016-06-14: 5 mg via ORAL
  Filled 2016-06-14: qty 1

## 2016-06-14 MED ORDER — DILTIAZEM HCL ER COATED BEADS 120 MG PO CP24: 120.0000 mg | ORAL_CAPSULE | Freq: Every day | ORAL | 0 refills | Status: DC

## 2016-06-14 MED ORDER — OXYCODONE-ACETAMINOPHEN 2.5-325 MG PO TABS: 1.0000 | ORAL_TABLET | Freq: Four times a day (QID) | ORAL | 0 refills | Status: AC | PRN

## 2016-06-14 MED ORDER — LISINOPRIL 5 MG PO TABS: 5.0000 mg | ORAL_TABLET | Freq: Every day | ORAL | 0 refills | Status: DC

## 2016-06-14 MED ORDER — FLUCONAZOLE IN SODIUM CHLORIDE 400-0.9 MG/200ML-% IV SOLN: 400.0000 mg | INTRAVENOUS | 0 refills | Status: AC

## 2016-06-14 MED ORDER — HEPARIN SOD (PORK) LOCK FLUSH 100 UNIT/ML IV SOLN
500.0000 [IU] | INTRAVENOUS | Status: AC | PRN
  Administered 2016-06-14: 500 [IU]

## 2016-06-14 MED ORDER — DOCUSATE SODIUM 100 MG PO CAPS: 100.0000 mg | ORAL_CAPSULE | Freq: Two times a day (BID) | ORAL | 0 refills | Status: AC | PRN

## 2016-06-14 MED ORDER — FLECAINIDE ACETATE 100 MG PO TABS: 100.0000 mg | ORAL_TABLET | Freq: Two times a day (BID) | ORAL | 0 refills | Status: DC

## 2016-06-14 MED ORDER — DEXTROSE 5 % IV SOLN
2.0000 g | INTRAVENOUS | Status: DC
  Administered 2016-06-14: 2 g via INTRAVENOUS
  Filled 2016-06-14: qty 2

## 2016-06-14 MED ORDER — CARVEDILOL 3.125 MG PO TABS: 9.3750 mg | ORAL_TABLET | Freq: Two times a day (BID) | ORAL | 0 refills | Status: DC

## 2016-06-14 MED ORDER — DEXTROSE 5 % IV SOLN: 2.0000 g | INTRAVENOUS | 0 refills | Status: DC

## 2016-06-14 NOTE — Progress Notes (Signed)
IV fluconazole administered slightly ahead of time as approved by pharmacy so  pt can be discharged earlier this evening

## 2016-06-14 NOTE — Progress Notes (Signed)
Pt discharged home in stable condition after going over discharge teaching with no concerns voiced 

## 2016-06-14 NOTE — Care Management Important Message (Signed)
Important Message  Patient Details  Name: Suzanne Macdonald MRN: [id number] Date of Birth: December 17, 1950   Medicare Important Message Given:  Yes    Eddith Mentor 06/14/2016, 3:11 PM

## 2016-06-14 NOTE — Progress Notes (Signed)
[facility] for Infectious Disease    Date of Admission:  06/09/2016   Total days of antibiotics 6        Day 1 Ceftriaxone        Day 3 Fluconazole          ID: Suzanne Macdonald is a 65 y.o. female with PMHx of incarcerated ventral hernia s/p repair in September 2017, HTN, asthma, pre-diabetes, Obesity, h/o gastric bypass, h/o of colon (s/p resection and chemo)and cervical cancer, and aortic atherosclerosis who presented to the ED on 10/21 with fever and abdominal pain and found to have a large E. Coli abdominal wall abscess.  Principal Problem:   Abdominal wall abscess Active Problems:   Prediabetes   Essential hypertension   History of gastric bypass   Paroxysmal atrial fibrillation (HCC)   History of colon cancer   History of cervical cancer   Asthma   Atrial fibrillation with tachycardic ventricular rate (HCC)   Morbid obesity ([place])   Aortic atherosclerosis (HCC)  Subjective: Patient was seen and examined this morning. She continues to have abdominal pain, slightly improved, but denies fever, chills, nausea, vomiting or diarrhea.   Medications:  . apixaban  5 mg Oral BID  . atorvastatin  10 mg Oral Daily  . carvedilol  9.375 mg Oral BID WC  . cefTRIAXone (ROCEPHIN)  IV  2 g Intravenous Q24H  . diltiazem  120 mg Oral Daily  . flecainide  100 mg Oral BID  . fluconazole (DIFLUCAN) IV  400 mg Intravenous Q24H  . lisinopril  5 mg Oral Daily  . mometasone-formoterol  2 puff Inhalation BID  . pantoprazole  40 mg Oral Daily  . polyethylene glycol  17 g Oral Daily  . traZODone  100 mg Oral QHS    Objective: Vitals:   06/13/16 2115 06/14/16 0533 06/14/16 0809 06/14/16 1023  BP: (!) 146/65 (!) 157/73  (!) 158/57  Pulse: 63 64  62  Resp: _0 Temp: 98.2 F (36.8 C) 99.1 F (37.3 C)  98.8 F (37.1 C)  TempSrc: Oral Oral  Oral  SpO2: 99% 96% 96% 96%  Weight:      Height:       General: Vital signs reviewed.  Patient is obese, in no acute distress and  cooperative with exam.  Cardiovascular: RRR, 2/6 systolic ejection murmur. Pulmonary/Chest: Clear on anterior auscultation. Abdominal: Soft, tender to palpation in right lower quadrant, non-distended, BS +, drain in place with minimal serosanguinous output. Extremities: Trace lower extremity edema bilaterally, pulses symmetric and intact bilaterally.   Lab Results  Recent Labs  06/12/16 0415  06/13/16 0506 06/14/16 0450  WBC  --   < > 6.3 8.6  HGB  --   < > 8.2* 8.9*  HCT  --   < > 27.3* 30.0*  NA 138  --   --   --   K 3.5  --   --   --   CL 106  --   --   --   CO2 26  --   --   --   BUN 11  --   --   --   CREATININE 0.82  --   --   --   < > = values in this interval not displayed.  Microbiology: Surgical Cx 10/23: E. Coli  ABUNDANT WBC PRESENT, PREDOMINANTLY PMN  FEW GRAM POSITIVE COCCI IN PAIRS AND CHAINS  FEW GRAM NEGATIVE RODS  FEW GRAM POSITIVE  RODS  RARE BUDDING YEAST SEEN  BCx 10/21 >> NGTD UCx 10/21 >> E. Coli  Current antibiotics: Vancomycin 10/21>>10/21 Zosyn 10/21 >>10/24 Unasyn 10/24 >>10/26 Fluconazole 10/24>> Ceftriaxone 10/26 >>  Assessment/Plan: Suzanne Macdonald is a 65 yo female with PMHx of incarcerated ventral hernia s/p repair in September 2017, HTN, asthma, pre-diabetes, obesity, h/o gastric bypass, h/o of colon and cervical cancer, and aortic atherosclerosis who presents with fever and abdominal pain.   Large Abdominal Wall Abscess s/p IR Drain Placement: Likely 2/2 to recent hernia repair versus fistulous tract to colon. Drained by IR on 10/23. Afebrile since 10/21. No leukocytosis. Her BCx remain NGTD but surgical culture is growing E. Coli, pan-sensitive. Per Surgery, patient can be discharged with drain in place and outpatient follow up. IR plans to follow up outpatient and pursue IR injection to evaluate for fistulas.  -Change to Ceftriaxone, will need 4 weeks of IV Ceftriaxone likely followed by oral antibiotics -Continue Fluconazole -Surgical Cx  >> E. Coli, polymicrobial, rare yeast -Will need repeat imaging as outpatient -Will need to follow with [facility] IR evaluation for fistulas  Diagnosis: Abdominal Abscess  Culture Result: E. Coli, Polymicrobial  Allergies  Allergen Reactions  . Scallops [Shellfish Allergy] Hives    Discharge antibiotics: Ceftriaxone and Fluconazole Per pharmacy protocol  Duration: 4 weeks End Date: 07/08/16  [facility] Care Per Protocol:  Labs weekly while on IV antibiotics: _x_ CBC with differential _x_ CMP __ CRP __ ESR __ Vancomycin trough  Fax weekly labs to [phone]  Martyn Malay, DO PGY-3 Internal Medicine Resident Pager # [phone] 06/14/2016 11:16 AM

## 2016-06-14 NOTE — Progress Notes (Signed)
   Subjective: [name] says her abdominal pain has continued to improve and she is very eager to go home. She denies fever, shortness of breath, or constipation. She denies any new concerns or complaints.   Objective:  Vital signs in last 24 hours: Vitals:   06/14/16 0533 06/14/16 0809 06/14/16 1023 06/14/16 1407  BP: (!) 157/73  (!) 158/57 130/89  Pulse: 64  62 62  Resp: '19  20 20  '$ Temp: 99.1 F (37.3 C)  98.8 F (37.1 C) 98.5 F (36.9 C)  TempSrc: Oral  Oral Oral  SpO2: 96% 96% 96% 96%  Weight:      Height:       Physical Exam  Constitutional: No distress.  Cardiovascular: Normal rate and regular rhythm.   No murmur heard. Pulmonary/Chest: Effort normal.  Abdominal: Soft. Bowel sounds are normal. She exhibits no distension. There is tenderness.  Drain has <12 cc of serosanguinous fluid   Extremities: no calf tenderness, no peripheral edema   Medications: Infusions:   Scheduled Medications: . apixaban  5 mg Oral BID  . atorvastatin  10 mg Oral Daily  . carvedilol  9.375 mg Oral BID WC  . cefTRIAXone (ROCEPHIN)  IV  2 g Intravenous Q24H  . diltiazem  120 mg Oral Daily  . flecainide  100 mg Oral BID  . fluconazole (DIFLUCAN) IV  400 mg Intravenous Q24H  . lisinopril  5 mg Oral Daily  . mometasone-formoterol  2 puff Inhalation BID  . pantoprazole  40 mg Oral Daily  . polyethylene glycol  17 g Oral Daily  . traZODone  100 mg Oral QHS   PRN Medications: acetaminophen **OR** acetaminophen, albuterol, docusate sodium, morphine injection, ondansetron **OR** ondansetron (ZOFRAN) IV, sodium chloride flush  Assessment/Plan: Abdominal wall abscess Drainage gram stain of this fluid showed gram positive cocci, gram positive rods, gram negative rods, and yeast, cultures revealed pan sensitive e.coli and the sample was re incubated and will need outpatient follow up.  She will have a surgery follow up scheduled for imaging after this abscess has resolved and the drain  has been removed. Interventional radiology will schedule an appointment to follow up with her in a week to inject the drain. Infectious disease has scheduled a follow up with her in a month.  IV ceftriaxone and fluconazole will be continued for 4 weeks total course stop date 07/06/2016. Leukocytosis has improved and she has been afebrile since the day of admission when she had Tmax 102.7.  - blood cultures grown 10/21 - no growth to date  - urine cultures drawn 10/21 - e.coli, this is asymptomatic and will be covered with the unasyn     Paroxysmal atrial fibrillation ([place]) She is rate controlled in normal sinus rhythm. Continue flecainide 100 mg BID and carvedilol 9.375 mg daily, cardizem extended release 120 mg tablets Restarted eliquis 5 mg BID  Pt instructed to follow up with Dr. Oval Linsey after discharge     Essential hypertension She remains normotensive on lisinopril 5 mg.     Asthma Continue home medications proventil and dulera    GERD  Continue home medication omeprazole 40 mg daily   Dispo: Anticipated discharge today   LOS: 4 days   Suzanne Noss, MD 06/14/2016, 3:51 PM Pager: [phone]

## 2016-06-14 NOTE — Progress Notes (Signed)
Patient ID: Suzanne Macdonald, female   DOB: 1951-05-15, 65 y.o.   MRN: [id number]   LOS: 4 days   Subjective: [place], eager to go home   Objective: Vital signs in last 24 hours: Temp:  [97.7 F (36.5 C)-99.1 F (37.3 C)] 99.1 F (37.3 C) (10/26 0533) Pulse Rate:  [62-64] 64 (10/26 0533) Resp:  [18-19] 19 (10/26 0533) BP: (124-157)/(54-73) 157/73 (10/26 0533) SpO2:  [96 %-99 %] 96 % (10/26 0809) Last BM Date: 06/13/16   Laboratory  CBC  Recent Labs  06/13/16 0506 06/14/16 0450  WBC 6.3 8.6  HGB 8.2* 8.9*  HCT 27.3* 30.0*  PLT 273 326   BMET  Recent Labs  06/12/16 0415  NA 138  K 3.5  CL 106  CO2 26  GLUCOSE 110*  BUN 11  CREATININE 0.82  CALCIUM 7.7*    Physical Exam General appearance: alert and no distress Resp: clear to auscultation bilaterally Cardio: regular rate and rhythm GI: normal findings: bowel sounds normal and soft, non-tender   Assessment/Plan: Abdominal wall abscess Status post ventral hernia repair with mesh 05/01/16 Dr. 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 Afebrile, WBC continues in normal range DVT: Lovenox 130 mg every 12 H   Plan: Can return home from surgical standpoint with drain in place and plan from ID. We will see her in follow-up in a couple of weeks (appt made and in D/C tab). We will continue to follow while she is here.    Lisette Abu, PA-C Pager: [phone] 06/14/2016

## 2016-06-14 NOTE — Care Management Note (Signed)
Case Management Note  Patient Details  Name: Suzanne Macdonald MRN: [id number] Date of Birth: 09-Feb-1951  Subjective/Objective:                    Action/Plan: Spoke with Dr Hetty Ely , discharge will most likely be today . Patient will need prescription for antibiotics and NS flushes .   Pam with [facility] aware and will meet with patient and her  Husband this afternoon.   Patient aware and voiced understanding of all of the above .   Expected Discharge Date:  06/13/16               Expected Discharge Plan:  [place]  In-House Referral:     Discharge planning Services  CM Consult  Post Acute Care Choice:    Choice offered to:  Patient  DME Arranged:    DME Agency:     HH Arranged:  RN [place] Agency:  [facility]  Status of Service:  Completed, signed off  If discussed at [place] of Stay Meetings, dates discussed:    Additional Comments:  Marilu Favre, RN 06/14/2016, 2:19 PM

## 2016-06-14 NOTE — Progress Notes (Signed)
  Date: 06/14/2016  Patient name: Suzanne Macdonald  Medical record number: [id number]  Date of birth: [birth year]   I have seen and evaluated this patient. Plan of care was discussed with residents in detail. I agree with findings and plan as documented in Dr. Fredrik Cove note.  Patient c/o mild abd pain but improved today. IR, surgery and ID f/u appreciated. Patient for d/c home today on IV ceftriaxone and fluconazole for 4 weeks and may require further abx after this. Patient will need ID follow up. Patient will also f/u with IR in 1 week for repeat CT and possible injection of the drain to evaluate for possible fistula. I suspect that she may have a fistula extending form the abscess site to her bowel anastomosis site given possible fistula on CT and patient growing E.Coli an abscess site. Will need home care set up for drain management as well as to teach her how to inject abx through her port site. Patient stable for d/c home today     Aldine Contes, MD 06/14/2016, 5:12 PM

## 2016-06-14 NOTE — Progress Notes (Signed)
Pharmacy Antibiotic Note  Suzanne Macdonald is a 65 y.o. female admitted on 06/09/2016 with abd wall abscess.  Pharmacy has been consulted for Rocephin dosing.  Plan:  Rocephin 2 gm IV q24  Height: '5\' 2"'$  (157.5 cm) Weight: 285 lb 4.8 oz (129.4 kg) IBW/kg (Calculated) : 50.1  Temp (24hrs), Avg:98.5 F (36.9 C), Min:97.7 F (36.5 C), Max:99.1 F (37.3 C)   Recent Labs Lab 06/09/16 1928 06/09/16 1942 06/09/16 2311 06/11/16 0600 06/12/16 0415 06/12/16 0600 06/12/16 0930 06/13/16 0506 06/14/16 0450  WBC 12.2*  --   --  11.5*  --  8.2  --  6.3 8.6  CREATININE 0.78  --   --  0.90 0.82  --   --   --   --   LATICACIDVEN  --  1.20 0.73  --   --   --   --   --   --   VANCOTROUGH  --   --   --   --   --   --  15  --   --     Estimated Creatinine Clearance: 88.3 mL/min (by C-G formula based on SCr of 0.82 mg/dL).    Allergies  Allergen Reactions  . Scallops [Shellfish Allergy] Hives    Antimicrobials this admission:  Vanc 10/21 >> 10/24 Zosyn 10/21 >> 10/24 Fluconazole 10/22 x 1 '150mg'$ ,   10/24>> Unasyn 10/24>>10/26 Rocephin 10/26>>  Dose adjustments this admission:  10/24 VT = 15 at 0930 am on 1 gm q12  Microbiology results:  10/21 BCx2>>ngtd 10/21 UCx:  > 100k E coli, pansensitive 10/23 abd wall abscess: few GPcocci, few GNR, few GPR, rare budding yeast seen, moderate pan sensitive E coli  Eudelia Bunch, Pharm.D. [phone] 06/14/2016 10:59 AM

## 2016-06-14 NOTE — Progress Notes (Signed)
Patient ID: Suzanne Macdonald, female   DOB: [birth year], 65 y.o.   MRN: [id number]    Referring Physician(s): Dr. Rolm Bookbinder  Supervising Physician: Markus Daft  Patient Status: [facility] - In-pt  Chief Complaint: Abdominal wall abscess  Subjective: Patient feeling better.  Supposed to go home today.  Allergies: Scallops [shellfish allergy]  Medications: Prior to Admission medications   Medication Sig Start Date End Date Taking? Authorizing Provider  acetaminophen (TYLENOL) 500 MG tablet Take 1,000 mg by mouth every 6 (six) hours as needed for mild pain or moderate pain.   Yes Historical Provider, MD  acetaminophen-codeine (TYLENOL #3) 300-30 MG tablet Take 1-2 tablets by mouth every 4 (four) hours as needed for moderate pain or severe pain. 05/06/16  Yes Bethany Molt, DO  albuterol (PROVENTIL HFA;VENTOLIN HFA) 108 (90 Base) MCG/ACT inhaler Inhale 1-2 puffs into the lungs every 6 (six) hours as needed for wheezing or shortness of breath.   Yes Historical Provider, MD  apixaban (ELIQUIS) 5 MG TABS tablet Take 1 tablet (5 mg total) by mouth 2 (two) times daily. 03/13/16  Yes Shela Leff, MD  atorvastatin (LIPITOR) 10 MG tablet take 1 tablet by mouth once daily 04/04/16  Yes Shela Leff, MD  budesonide-formoterol [facility]) 160-4.5 MCG/ACT inhaler Inhale 2 puffs into the lungs 2 (two) times daily. 01/24/16  Yes Milagros Loll, MD  Calcium-Phosphorus-Vitamin D (CALCIUM/VITAMIN D3/ADULT GUMMY PO) Take 2 each by mouth daily.   Yes Historical Provider, MD  carvedilol (COREG) 6.25 MG tablet Take 1.5 tablets (9.375 mg total) by mouth 2 (two) times daily. 04/27/16  Yes [name], MD  cholecalciferol (VITAMIN D) 1000 units tablet Take 2,000 Units by mouth daily.    Yes Historical Provider, MD  cyanocobalamin 500 MCG tablet Take 1,000 mcg by mouth daily.    Yes Historical Provider, MD  flecainide (TAMBOCOR) 50 MG tablet Take 1 tablet (50 mg total) by mouth 2 (two) times daily. 04/27/16   Yes [name], MD  hydrochlorothiazide (HYDRODIURIL) 25 MG tablet Take 0.5 tablets (12.5 mg total) by mouth daily. 04/27/16  Yes [name], MD  Multiple Vitamins-Minerals (ALIVE WOMENS GUMMY) CHEW Chew 2 each by mouth daily.   Yes Historical Provider, MD  omeprazole (PRILOSEC) 40 MG capsule Take 1 capsule (40 mg total) by mouth daily. 04/27/16  Yes [name], MD  polyethylene glycol (MIRALAX / GLYCOLAX) packet Take 17 g by mouth daily. 05/06/16  Yes Bethany Molt, DO  senna-docusate (SENOKOT-S) 8.6-50 MG tablet Take 1 tablet by mouth at bedtime as needed for mild constipation. 05/06/16  Yes Bethany Molt, DO  traZODone (DESYREL) 100 MG tablet Take 100 mg by mouth at bedtime. 04/22/16  Yes Historical Provider, MD  ondansetron (ZOFRAN-ODT) 8 MG disintegrating tablet Take 1 tablet (8 mg total) by mouth every 8 (eight) hours as needed for nausea or vomiting. Patient not taking: Reported on 06/09/2016 01/22/16   Davonna Belling, MD    Vital Signs: BP (!) 158/57 (BP Location: Right Arm)   Pulse 62   Temp 98.8 F (37.1 C) (Oral)   Resp 20   Ht '5\' 2"'$  (1.575 m)   Wt 285 lb 4.8 oz (129.4 kg)   SpO2 96%   BMI 52.18 kg/m   Physical Exam: Abd: soft, tender around drain appropriately, drain site is c/d/i/  Drain with 65cc of serous output in last 24hrs.   Imaging: Ct Image Guided Drainage By Percutaneous Catheter  Result Date: 06/11/2016 INDICATION: 65 year old with history of ventral hernia repair secondary to  an incarcerated ventral hernia. Patient is complaining of worsening right abdominal pain. Recent CT demonstrates an air-fluid collection in the right abdominal subcutaneous tissues. Request for CT-guided aspiration and drainage. EXAM: CT GUIDED DRAINAGE OF SUBCUTANEOUS ABDOMINAL WALL ABSCESS MEDICATIONS: None ANESTHESIA/SEDATION: 1.0 mg IV Versed 50 mcg IV Fentanyl Moderate Sedation Time:  25 minutes The patient was continuously monitored during the procedure by the interventional  radiology nurse under my direct supervision. COMPLICATIONS: None immediate. TECHNIQUE: Informed written consent was obtained from the patient after a thorough discussion of the procedural risks, benefits and alternatives. All questions were addressed. A timeout was performed prior to the initiation of the procedure. PROCEDURE: Patient was placed supine. CT images through the abdomen were obtained. The large air-fluid collection in the right lateral abdominal subcutaneous tissues was targeted. The right side of the abdomen was prepped with chlorhexidine and sterile field was created. Using CT guidance, 18 gauge needle was directed into this air-fluid collection. Approximately 20 mL of gas was aspirated. 5 mL of yellow purulent fluid was removed. A stiff Amplatz wire was advanced into the collection and the tract was dilated to accommodate a 10.2 [place] multipurpose drain. Catheter was attached to a suction bulb. Catheter was sutured to the skin. FINDINGS: Collection in the right lateral abdominal subcutaneous tissues. This collection is predominantly made of gas. Small amount yellow purulent fluid removed from this collection. Again noted is a large low-density collection along the anterior abdominal wall probably related to a postoperative seroma. The right lateral abdominal collection was completely decompressed following placement of the catheter. This collection does not appear to communicate with the larger adjacent anterior collection. IMPRESSION: Successful placement of CT-guided drain within the right lateral abdominal subcutaneous collection. Small amount of purulent fluid was removed and sent for culture. Electronically Signed   By: Markus Daft M.D.   On: 06/11/2016 17:32    Labs:  CBC:  Recent Labs  06/11/16 0600 06/12/16 0600 06/13/16 0506 06/14/16 0450  WBC 11.5* 8.2 6.3 8.6  HGB 8.7* 8.6* 8.2* 8.9*  HCT 28.2* 28.5* 27.3* 30.0*  PLT 257 283 273 326    COAGS:  Recent Labs   06/11/16 0906  INR 1.28    BMP:  Recent Labs  05/09/16 0350 06/09/16 1928 06/11/16 0600 06/12/16 0415  NA 138 135 134* 138  K 2.8* 3.0* 3.1* 3.5  CL 103 100* 99* 106  CO2 '23 25 26 26  '$ GLUCOSE 93 125* 138* 110*  BUN '7 12 13 11  '$ CALCIUM 8.1* 8.5* 7.9* 7.7*  CREATININE 0.77 0.78 0.90 0.82  GFRNONAA >60 >60 >60 >60  GFRAA >60 >60 >60 >60    LIVER FUNCTION TESTS:  Recent Labs  01/21/16 2325 01/24/16 0952 04/28/16 1439 06/09/16 1928  BILITOT 0.6 0.3 0.5 1.1  AST 33 35 43* 19  ALT 34 30 46 11*  ALKPHOS 88 94 67 72  PROT 6.2* 6.3 5.9* 6.0*  ALBUMIN 3.3* 3.9 3.5 2.9*    Assessment and Plan: 1. S/p abdominal wall drain placement, 10/23, [place] -cont with drainage catheter at home.  Discussed with primary service about arranging home health for routine flushes with 5-10cc of NS daily. -I will arrange for follow up in the drain clinic in 7-10 days for a repeat CT scan, +/- drain injection. -CX revealed e. Coli, defer abx therapy to primary service  Electronically Signed: Saverio Danker E 06/14/2016, 12:12 PM   I spent a total of 15 Minutes at the the patient's bedside AND on the patient's  hospital floor or unit, greater than 50% of which was counseling/coordinating care for abdominal wall abscess, s/p perc drain

## 2016-06-15 DIAGNOSIS — Z9884 Bariatric surgery status: Secondary | ICD-10-CM | POA: Diagnosis not present

## 2016-06-15 DIAGNOSIS — L02211 Cutaneous abscess of abdominal wall: Secondary | ICD-10-CM | POA: Diagnosis not present

## 2016-06-15 DIAGNOSIS — Z8541 Personal history of malignant neoplasm of cervix uteri: Secondary | ICD-10-CM | POA: Diagnosis not present

## 2016-06-15 DIAGNOSIS — Z5181 Encounter for therapeutic drug level monitoring: Secondary | ICD-10-CM | POA: Diagnosis not present

## 2016-06-15 DIAGNOSIS — Z438 Encounter for attention to other artificial openings: Secondary | ICD-10-CM | POA: Diagnosis not present

## 2016-06-15 DIAGNOSIS — I700 Atherosclerosis of aorta: Secondary | ICD-10-CM | POA: Diagnosis not present

## 2016-06-15 DIAGNOSIS — I10 Essential (primary) hypertension: Secondary | ICD-10-CM | POA: Diagnosis not present

## 2016-06-15 DIAGNOSIS — Z6841 Body Mass Index (BMI) 40.0 and over, adult: Secondary | ICD-10-CM | POA: Diagnosis not present

## 2016-06-15 DIAGNOSIS — J45909 Unspecified asthma, uncomplicated: Secondary | ICD-10-CM | POA: Diagnosis not present

## 2016-06-15 DIAGNOSIS — Z85038 Personal history of other malignant neoplasm of large intestine: Secondary | ICD-10-CM | POA: Diagnosis not present

## 2016-06-15 DIAGNOSIS — Z452 Encounter for adjustment and management of vascular access device: Secondary | ICD-10-CM | POA: Diagnosis not present

## 2016-06-17 NOTE — Discharge Summary (Signed)
Name: Suzanne Macdonald MRN: [id number] DOB: 1951/07/26 65 y.o. PCP: Bartholomew Crews, MD  Date of Admission: 06/09/2016  7:28 PM Date of Discharge: 06/14/2016 Attending Physician: Dr. Aldine Contes   Discharge Diagnosis: Principal Problem:   Abdominal wall abscess Active Problems:   Prediabetes   Essential hypertension   History of gastric bypass   Paroxysmal atrial fibrillation (HCC)   History of colon cancer   History of cervical cancer   Asthma   Atrial fibrillation with tachycardic ventricular rate (HCC)   Morbid obesity ([place])   Aortic atherosclerosis ([place])   Discharge Medications:   Medication List    STOP taking these medications   hydrochlorothiazide 25 MG tablet Commonly known as:  HYDRODIURIL     TAKE these medications   acetaminophen 500 MG tablet Commonly known as:  TYLENOL Take 1,000 mg by mouth every 6 (six) hours as needed for mild pain or moderate pain.   acetaminophen-codeine 300-30 MG tablet Commonly known as:  TYLENOL #3 Take 1-2 tablets by mouth every 4 (four) hours as needed for moderate pain or severe pain.   albuterol 108 (90 Base) MCG/ACT inhaler Commonly known as:  PROVENTIL HFA;VENTOLIN HFA Inhale 1-2 puffs into the lungs every 6 (six) hours as needed for wheezing or shortness of breath.   ALIVE WOMENS GUMMY Chew Chew 2 each by mouth daily.   apixaban 5 MG Tabs tablet Commonly known as:  ELIQUIS Take 1 tablet (5 mg total) by mouth 2 (two) times daily.   atorvastatin 10 MG tablet Commonly known as:  LIPITOR take 1 tablet by mouth once daily   budesonide-formoterol 160-4.5 MCG/ACT inhaler Commonly known as:  SYMBICORT Inhale 2 puffs into the lungs 2 (two) times daily.   CALCIUM/VITAMIN D3/ADULT GUMMY PO Take 2 each by mouth daily.   carvedilol 3.125 MG tablet Commonly known as:  COREG Take 3 tablets (9.375 mg total) by mouth 2 (two) times daily with a meal. What changed:  medication strength  when to take this     cefTRIAXone 2 g in dextrose 5 % 50 mL Inject 2 g into the vein daily.   cholecalciferol 1000 units tablet Commonly known as:  VITAMIN D Take 2,000 Units by mouth daily.   cyanocobalamin 500 MCG tablet Take 1,000 mcg by mouth daily.   diltiazem 120 MG 24 hr capsule Commonly known as:  CARDIZEM CD Take 1 capsule (120 mg total) by mouth daily.   docusate sodium 100 MG capsule Commonly known as:  COLACE Take 1 capsule (100 mg total) by mouth 2 (two) times daily as needed for mild constipation.   flecainide 100 MG tablet Commonly known as:  TAMBOCOR Take 1 tablet (100 mg total) by mouth 2 (two) times daily. What changed:  medication strength  how much to take   fluconazole 400-0.9 MG/200ML-% IVPB Commonly known as:  DIFLUCAN Inject 200 mLs (400 mg total) into the vein daily.   lisinopril 5 MG tablet Commonly known as:  PRINIVIL,ZESTRIL Take 1 tablet (5 mg total) by mouth daily.   omeprazole 40 MG capsule Commonly known as:  PRILOSEC Take 1 capsule (40 mg total) by mouth daily.   ondansetron 8 MG disintegrating tablet Commonly known as:  ZOFRAN-ODT Take 1 tablet (8 mg total) by mouth every 8 (eight) hours as needed for nausea or vomiting.   oxycodone-acetaminophen 2.5-325 MG tablet Commonly known as:  PERCOCET Take 1 tablet by mouth every 6 (six) hours as needed for pain.   polyethylene glycol packet Commonly  known as:  MIRALAX / GLYCOLAX Take 17 g by mouth daily.   senna-docusate 8.6-50 MG tablet Commonly known as:  Senokot-S Take 1 tablet by mouth at bedtime as needed for mild constipation.   traZODone 100 MG tablet Commonly known as:  DESYREL Take 100 mg by mouth at bedtime.       Disposition and follow-up:   Ms.Sybel Fedak was discharged from [facility] in Stable condition.  At the hospital follow up visit please address:  1.  Abdominal wall abscess - Has she set up a follow up appointment with the drain clinic? How are IV  antibiotics at home going?  Atrial fibrillation- is her rate controlled with medication changes? Has she made an appointment for cardiology follow-up?  2.  Labs / imaging needed at time of follow-up: CBC (Hgb 8.9 at discharge 10/26), BMET   3.  Pending labs/ test needing follow-up: none   Follow-up Appointments: Follow-up Information    Edward Jolly, MD Follow up on 06/29/2016.   Specialty:  General Surgery Why:  1:15PM Contact information: [address] [place] [address] [phone]        [name], MD Follow up on 07/16/2016.   Specialty:  Infectious Diseases Why:  2 PM Contact information: [place] [address] [place]  [address] [phone]        [name], MD Follow up on 06/20/2016.   Specialty:  Internal Medicine Why:  10:45  Contact information: [place] [address] [phone]        [name], MD. Schedule an appointment as soon as possible for a visit today.   Specialty:  Cardiology Contact information: [address] [place] [address] [phone]           Hospital Course by problem list: Principal Problem:   Abdominal wall abscess Active Problems:   Prediabetes   Essential hypertension   History of gastric bypass   Paroxysmal atrial fibrillation (HCC)   History of colon cancer   History of cervical cancer   Asthma   Atrial fibrillation with tachycardic ventricular rate (HCC)   Morbid obesity ([place])   Aortic atherosclerosis ([place])   Abdominal wall abscess  [name] is a 65 yo female with PMHx of incarcerated ventral hernia s/p repair in September 2017, HTN, asthma, pre-diabetes, Obesity, h/o gastric bypass, h/o of colon (s/p resection and chemo) and cervical cancer who presented with fever and abdominal pain. One week prior to presentation she developed right sided abdominal pain, decreased appetite, fever and chills. The ED she was found to be  febrile with a 2.7 with leukocytosis 12.2. CT abdomen and pelvis showed a large abscess adjacent to her small bowel anastomosis. Interventional radiology was consulted, they placed a drain the abscess on 10/23 and send the fluid for cultures. The cultures grew lactobacillus and a small number of yeast. Blood cultures grew no growth for 5 days. She received 4 days of vancomycin and zosyn then was switched to IV ceftriaxone and IV fluconazole. On the day of discharge her abdominal pain had mostly resolved and she was tolerating regular diet. Her husband was taught how to administer IV antibiotics through her port and she was discharged with IV ceftriaxone and IV fluconazole and plans to complete a 4 week course with stop date 11/17. His follow-up at the internal medicine clinic and with interventional radiology was followed. At her interventional radiology follow-up imaging inject dye into her drain to  further investigate the source of this abscess. She was also scheduled for follow up with the surgery and infectious disease.   Atrial fibrillation  During this admission she was found to be in atrial fibrillation with RVR. Her home medications include Flecainide 50 mg twice daily and carvedilol 9.375 mg twice daily. Cardiology was consulted and started Cardizem 120 mg daily and increase flecainide to 100 mg twice daily which achieved good rate control. Her QTc was monitored with periodic EKGs. At discharge she was provided with a prescription for Cardizem 120 mg daily and flecainide 100 mg twice daily and told to continue taking her home medication carvedilol 9.375. She was told to schedule cardiology follow-up with Dr. Oval Linsey. Anticoagulation prophylaxis was managed with Lovenox, her home medication Lasix 5 mg twice daily was resumed at discharge.  Essential hypertension  Her home medications include hydrochlorothiazide 12.5 mg daily. On presentation she was hypokalemic. She had never been on ACE inhibitor or  ARB in the past and this hypokalemia was thought to be related to her hydrochlorothiazide. Hydrochlorothiazide was discontinued and she was started on lisinopril 5 mg daily.   Asymptomatic bacteriuria Urine cultures drawn on the day of admission grew Escherichia coli. However she denies any symptoms and had no signs of urinary tract infection. The Escherichia coli in her urine was covered by antibiotics given for abdominal wall abscess as mentioned above.  Discharge Vitals:   BP 130/89 (BP Location: Right Arm)   Pulse 62   Temp 98.5 F (36.9 C) (Oral)   Resp 20   Ht '5\' 2"'$  (1.575 m)   Wt 285 lb 4.8 oz (129.4 kg)   SpO2 96%   BMI 52.18 kg/m    Procedures Performed:  Dg Chest 2 View  Result Date: 06/09/2016 CLINICAL DATA:  Abdominal pain and fever. Hernia surgery 2 weeks ago. EXAM: CHEST  2 VIEW COMPARISON:  03/02/2016 FINDINGS: Right subclavian Port-A-Cath remains in place with tip overlying the lower SVC. The cardiomediastinal silhouette is within normal limits. There is no evidence of airspace consolidation, edema, pleural effusion, or pneumothorax. Thoracic spondylosis is noted. IMPRESSION: No active cardiopulmonary disease. Electronically Signed   By: Logan Bores M.D.   On: 06/09/2016 21:38   Ct Abdomen Pelvis W Contrast  Result Date: 06/09/2016 CLINICAL DATA:  65 year old female with abdominal pain and fever. History of hernia surgery 2 weeks ago. EXAM: CT ABDOMEN AND PELVIS WITH CONTRAST TECHNIQUE: Multidetector CT imaging of the abdomen and pelvis was performed using the standard protocol following bolus administration of intravenous contrast. CONTRAST:  111m ISOVUE-300 IOPAMIDOL (ISOVUE-300) INJECTION 61% COMPARISON:  Abdominal CT dated 05/09/2016 FINDINGS: Lower chest: The visualized lung bases are clear. No intra-abdominal free air or free fluid. Hepatobiliary: Diffuse fatty infiltration of the liver. No intrahepatic biliary ductal dilatation. The gallbladder is unremarkable.  Pancreas: Unremarkable. No pancreatic ductal dilatation or surrounding inflammatory changes. Spleen: Normal in size without focal abnormality. Adrenals/Urinary Tract: There is a 1.5 cm left adrenal hypodense nodule measuring less than 10 Hounsfield units most compatible with an adenoma. The right adrenal gland appears unremarkable. The kidneys, visualized ureters, and urinary bladder appear unremarkable. Stomach/Bowel: There postsurgical changes of gastric bypass with multiple anastomotic sutures in the small bowel. There is also partial resection of the sigmoid colon. No evidence of bowel obstruction or active inflammation. Normal appendix. Vascular/Lymphatic: There is mild moderate aortoiliac atherosclerotic disease. The abdominal aorta and IVC are otherwise unremarkable. No portal venous gas identified. There is no adenopathy. Reproductive: Hysterectomy. Other: There is  a midline vertical anterior pelvic wall incisional scar. Postsurgical changes of ventral hernia repair noted. There is a large fluid collection in the anterior abdominal wall along measuring approximately 21 x 5 cm in greatest axial dimension and 22 cm in the craniocaudal length similar to the prior CT. There may be minimal enhancement of the adjacent capsule concerning for an infected collection. A 5.0 x 7.2 cm collection noted containing air-fluid level in the right anterior abdominal wall lateral to the larger collection. This collection appears to be more superficial and likely in the subcutaneous tissues and superficial to the external oblique musculature. This collection is new since the prior CT. There is an apparent tract like communication extending from this collection to the deeper soft tissues and through the abdominal fascia and musculature and abutting the small bowel anastomosis in the right anterior abdominal wall. There is also apparent communication with the larger collection in the anterior abdominal wall. Although the smaller  collection in the lateral right anterior abdominal wall may be extension of the larger collection in the midline, dehiscence of in the adjacent small bowel anastomosis is not entirely excluded. Further evaluation with CT with oral contrast is recommended. There is diffuse stranding of the subcutaneous soft tissues of the anterior abdominal wall. Musculoskeletal: Multilevel degenerative changes of the spine. No acute fracture. IMPRESSION: Persistent large loculated fluid in the anterior abdominal wall at the site of hernia repair concerning for infected fluid. There has been interval development of a smaller collection with air and fluid level in the right anterior abdominal wall laterally which appears to communicate with the larger collection in the midline anterior abdomen. A tract like communication also extends from the smaller collection in the right lateral anterior abdominal wall and abuts the adjacent small bowel anastomosis. Dehiscence of the anastomotic suture as the cause of fluid collection is not entirely excluded. Further evaluation with CT with oral contrast is recommended. Electronically Signed   By: Anner Crete M.D.   On: 06/09/2016 23:29   Ct Image Guided Drainage By Percutaneous Catheter  Result Date: 06/11/2016 INDICATION: 65 year old with history of ventral hernia repair secondary to an incarcerated ventral hernia. Patient is complaining of worsening right abdominal pain. Recent CT demonstrates an air-fluid collection in the right abdominal subcutaneous tissues. Request for CT-guided aspiration and drainage. EXAM: CT GUIDED DRAINAGE OF SUBCUTANEOUS ABDOMINAL WALL ABSCESS MEDICATIONS: None ANESTHESIA/SEDATION: 1.0 mg IV Versed 50 mcg IV Fentanyl Moderate Sedation Time:  25 minutes The patient was continuously monitored during the procedure by the interventional radiology nurse under my direct supervision. COMPLICATIONS: None immediate. TECHNIQUE: Informed written consent was obtained  from the patient after a thorough discussion of the procedural risks, benefits and alternatives. All questions were addressed. A timeout was performed prior to the initiation of the procedure. PROCEDURE: Patient was placed supine. CT images through the abdomen were obtained. The large air-fluid collection in the right lateral abdominal subcutaneous tissues was targeted. The right side of the abdomen was prepped with chlorhexidine and sterile field was created. Using CT guidance, 18 gauge needle was directed into this air-fluid collection. Approximately 20 mL of gas was aspirated. 5 mL of yellow purulent fluid was removed. A stiff Amplatz wire was advanced into the collection and the tract was dilated to accommodate a 10.2 [place] multipurpose drain. Catheter was attached to a suction bulb. Catheter was sutured to the skin. FINDINGS: Collection in the right lateral abdominal subcutaneous tissues. This collection is predominantly made of gas. Small amount yellow purulent  fluid removed from this collection. Again noted is a large low-density collection along the anterior abdominal wall probably related to a postoperative seroma. The right lateral abdominal collection was completely decompressed following placement of the catheter. This collection does not appear to communicate with the larger adjacent anterior collection. IMPRESSION: Successful placement of CT-guided drain within the right lateral abdominal subcutaneous collection. Small amount of purulent fluid was removed and sent for culture. Electronically Signed   By: Markus Daft M.D.   On: 06/11/2016 17:32    Discharge Instructions: Discharge Instructions    Call MD for:  persistant nausea and vomiting    Complete by:  As directed    Call MD for:  redness, tenderness, or signs of infection (pain, swelling, redness, odor or green/yellow discharge around incision site)    Complete by:  As directed    Call MD for:  severe uncontrolled pain    Complete by:  As  directed    Diet - low sodium heart healthy    Complete by:  As directed    Increase activity slowly    Complete by:  As directed       Signed: Ledell Noss, MD 06/17/2016, 9:21 PM   Pager: [phone]

## 2016-06-18 ENCOUNTER — Telehealth: Payer: Self-pay | Admitting: *Deleted

## 2016-06-18 ENCOUNTER — Other Ambulatory Visit: Payer: Self-pay | Admitting: General Surgery

## 2016-06-18 DIAGNOSIS — Z438 Encounter for attention to other artificial openings: Secondary | ICD-10-CM | POA: Diagnosis not present

## 2016-06-18 DIAGNOSIS — L02211 Cutaneous abscess of abdominal wall: Secondary | ICD-10-CM

## 2016-06-18 DIAGNOSIS — I10 Essential (primary) hypertension: Secondary | ICD-10-CM | POA: Diagnosis not present

## 2016-06-18 DIAGNOSIS — J45909 Unspecified asthma, uncomplicated: Secondary | ICD-10-CM | POA: Diagnosis not present

## 2016-06-18 DIAGNOSIS — I700 Atherosclerosis of aorta: Secondary | ICD-10-CM | POA: Diagnosis not present

## 2016-06-18 DIAGNOSIS — Z452 Encounter for adjustment and management of vascular access device: Secondary | ICD-10-CM | POA: Diagnosis not present

## 2016-06-18 LAB — AEROBIC/ANAEROBIC CULTURE (SURGICAL/DEEP WOUND)

## 2016-06-18 LAB — AEROBIC/ANAEROBIC CULTURE W GRAM STAIN (SURGICAL/DEEP WOUND)

## 2016-06-18 NOTE — Telephone Encounter (Signed)
[facility] Suzanne Macdonald calls and states pt has a little congestion, lungs are clear, no fevers, clear mucous- ask if pt can try some guaifenesin based OTC med, told her to tell pt to check with the pharmacist and let he or she go over her med list and suggest OTC, she was agreeable

## 2016-06-19 ENCOUNTER — Encounter: Payer: Medicare Other | Admitting: Internal Medicine

## 2016-06-20 ENCOUNTER — Other Ambulatory Visit: Payer: Self-pay | Admitting: Oncology

## 2016-06-20 ENCOUNTER — Ambulatory Visit (INDEPENDENT_AMBULATORY_CARE_PROVIDER_SITE_OTHER): Payer: Medicare Other | Admitting: Internal Medicine

## 2016-06-20 ENCOUNTER — Encounter: Payer: Self-pay | Admitting: Internal Medicine

## 2016-06-20 VITALS — BP 122/61 | HR 65 | Temp 98.4°F | Wt 284.2 lb

## 2016-06-20 DIAGNOSIS — Z79899 Other long term (current) drug therapy: Secondary | ICD-10-CM | POA: Diagnosis not present

## 2016-06-20 DIAGNOSIS — L02211 Cutaneous abscess of abdominal wall: Secondary | ICD-10-CM | POA: Diagnosis not present

## 2016-06-20 DIAGNOSIS — I10 Essential (primary) hypertension: Secondary | ICD-10-CM | POA: Diagnosis not present

## 2016-06-20 DIAGNOSIS — Z23 Encounter for immunization: Secondary | ICD-10-CM | POA: Diagnosis not present

## 2016-06-20 DIAGNOSIS — I480 Paroxysmal atrial fibrillation: Secondary | ICD-10-CM | POA: Diagnosis not present

## 2016-06-20 DIAGNOSIS — B9689 Other specified bacterial agents as the cause of diseases classified elsewhere: Secondary | ICD-10-CM

## 2016-06-20 DIAGNOSIS — Z9689 Presence of other specified functional implants: Secondary | ICD-10-CM

## 2016-06-20 DIAGNOSIS — Z5189 Encounter for other specified aftercare: Secondary | ICD-10-CM | POA: Diagnosis present

## 2016-06-20 DIAGNOSIS — Z87891 Personal history of nicotine dependence: Secondary | ICD-10-CM

## 2016-06-20 MED ORDER — DILTIAZEM HCL ER COATED BEADS 120 MG PO CP24: 120.0000 mg | ORAL_CAPSULE | Freq: Every day | ORAL | 4 refills | Status: DC

## 2016-06-20 NOTE — Assessment & Plan Note (Signed)
She has a history of atrial fibrillation and went into rapid ventricular response during her prior hospitalization. She was started on Cardizem 120 mg daily, flecainide was increased to 100 mg twice daily and carvedilol 9.375 twice daily was continued. Last night she awoke with palpitations and realized that her heart rate was 147. She states that when she went to pick up her prescriptions from the pharmacy after hospitalization she did not realize that she was supposed to start taking Cardizem and threw this prescription away. She has continued to take carvedilol and increase her flecainide dose. On exam today she has a regular rhythm and her rate is controlled at 65 beats per minute   Sent a new prescription for Cardizem 120 mg daily to her pharmacy Continue to connect 100 mg daily and carvedilol 9.375 twice daily She has a follow-up appointment scheduled with Dr. Oval Linsey next week She will start Cardizem today and call us if she continues to have RVR

## 2016-06-20 NOTE — Assessment & Plan Note (Addendum)
Suzanne Macdonald was recently hospitalized for an abdominal wall abscess thought to be related to her history of multiple abdominal surgeries for colon cancer resection and hernias. She was discharged on 10/26 with IV antibiotics, IV antifungal, and an abdominal wall drain. She has remained afebrile and her abdominal pain has resolved is well-controlled with Tylenol at home. The drain collects about 15 mL of fluid per day. She has a follow-up with interventional radiology scheduled for next week at this visit they will investigate the cause of this abdominal wall abscess.  Continue IV ceftriaxone and IV fluconazole until stop date 11/17  Follow-up with interventional radiology scheduled Follow-up with general surgery scheduled Follow-up with infectious disease scheduled She will follow-up with her PCP in 3 months and call the clinic if she develops any new abdominal pain or fevers before that point

## 2016-06-20 NOTE — Assessment & Plan Note (Signed)
BP well controlled today 122/61. During the hospitalization she had hypokalemia and her home medication hydrochlorothiazide 12.5 mg was switch to lisinopril 5 mg daily for blood pressure control. She states that she has had some slight increased foot swelling since this hospitalization and believes that this is related to stopping the hydrochlorothiazide.  Will hold off on further changes in medication as she has a cardiology follow up scheduled for next week. She is in agreement with this plan.  Continue Lisinopril 5 mg daily  She is also on cardiazem 120 mg daily and carvedilol 9.375 mg BID for atrial fibrillation

## 2016-06-20 NOTE — Progress Notes (Addendum)
   CC: Hospital follow-up for abdominal wall abscess  HPI: [name] is a 65 y.o. with past medical history as outlined below who presents to clinic for follow up after hospitalization for follow-up of abdominal wall abscess. Suzanne Macdonald was recently hospitalized for an abdominal wall abscess thought to be related to her history of multiple abdominal surgeries for colon cancer resection and hernias. She was discharged on 10/26 with IV antibiotics, IV antifungal, and an abdominal wall drain. She has remained afebrile and her abdominal pain has resolved is well-controlled with Tylenol at home. The drain collects about 15 mL of fluid per day. She has a follow-up with interventional radiology scheduled for next week. She has a history of atrial fibrillation and went into rapid ventricular response during this hospitalization. She was started on Cardizem 120 mg daily, flecainide was increased to 100 mg twice daily and carvedilol 9.375 twice daily was continued. Last night she awoke with palpitations and realized that her heart rate was 147. She states that when she went to pick up her prescriptions from the pharmacy after hospitalization she did not realize that she was supposed to start taking Cardizem and threw this prescription away. She has continued to take carvedilol and increase her flecainide dose. During the hospitalization she had hypokalemia and her home medication hydrochlorothiazide and switch to lisinopril 5 milligrams daily for blood pressure control. She states that she has had some slightly increased foot swelling since this hospitalization and believes that this is related to stopping the hydrochlorothiazide.  Please see problem list for status of the pt's chronic medical problems.  Past Medical History:  Diagnosis Date  . Asthma   . Atrial fibrillation ([place])   . Cervical cancer ([place]) 1972  . Colon cancer ([place])   . H/O gastric bypass   . Hyperlipidemia   . Hypertension   .  Morbid obesity ([place]) 03/04/2016  . Sleep apnea     Review of Systems:   Review of Systems  Constitutional: Negative for fever.  Cardiovascular: Positive for leg swelling. Negative for chest pain.  Gastrointestinal: Negative for abdominal pain and constipation.    Physical Exam:  Vitals:   06/20/16 1103  BP: 122/61  Pulse: 65  Temp: 98.4 F (36.9 C)  TempSrc: Oral  SpO2: 98%  Weight: 284 lb 3.2 oz (128.9 kg)   Physical Exam  Constitutional: No distress.  Cardiovascular: Normal rate and regular rhythm.   No murmur heard. Pulmonary/Chest: She has no wheezes. She has no rales.  Abdominal: Soft. She exhibits no distension. There is no tenderness.  Abdominal wall drain dressing clean dry and intact. There is about 10 mL of thin white drainage.    Assessment & Plan:   See Encounters Tab for problem based charting.   Patient seen with Dr. Beryle Beams   Medicine Attending: I personally interviewed and briefly examined this patient and discussed the management plan with resident physician doctor Ledell Noss on the day of the patient visit.  Murriel Hopper, MD, [place]  [facility]/Internal Medicine

## 2016-06-20 NOTE — Patient Instructions (Addendum)
It was great to see you again today Suzanne Macdonald, I'm glad that you are feeling better.   Please pick up the prescription for cardiazem from the pharmacy.   Today you have gotten your Pneumonia- 13 vaccination    Please call us if you develop new symptoms, your abdominal pain gets worse, or your heart rate with afib is greater than 120.   Please schedule an appointment to see your primary care doctor in 3 months.

## 2016-06-21 DIAGNOSIS — A499 Bacterial infection, unspecified: Secondary | ICD-10-CM | POA: Diagnosis not present

## 2016-06-21 DIAGNOSIS — J45909 Unspecified asthma, uncomplicated: Secondary | ICD-10-CM | POA: Diagnosis not present

## 2016-06-21 DIAGNOSIS — L02211 Cutaneous abscess of abdominal wall: Secondary | ICD-10-CM | POA: Diagnosis not present

## 2016-06-21 DIAGNOSIS — I700 Atherosclerosis of aorta: Secondary | ICD-10-CM | POA: Diagnosis not present

## 2016-06-21 DIAGNOSIS — Z438 Encounter for attention to other artificial openings: Secondary | ICD-10-CM | POA: Diagnosis not present

## 2016-06-21 DIAGNOSIS — I10 Essential (primary) hypertension: Secondary | ICD-10-CM | POA: Diagnosis not present

## 2016-06-21 DIAGNOSIS — Z452 Encounter for adjustment and management of vascular access device: Secondary | ICD-10-CM | POA: Diagnosis not present

## 2016-06-21 NOTE — Telephone Encounter (Signed)
Sounds good

## 2016-06-22 DIAGNOSIS — Z438 Encounter for attention to other artificial openings: Secondary | ICD-10-CM | POA: Diagnosis not present

## 2016-06-22 DIAGNOSIS — J45909 Unspecified asthma, uncomplicated: Secondary | ICD-10-CM | POA: Diagnosis not present

## 2016-06-22 DIAGNOSIS — Z452 Encounter for adjustment and management of vascular access device: Secondary | ICD-10-CM | POA: Diagnosis not present

## 2016-06-22 DIAGNOSIS — L02211 Cutaneous abscess of abdominal wall: Secondary | ICD-10-CM | POA: Diagnosis not present

## 2016-06-22 DIAGNOSIS — I10 Essential (primary) hypertension: Secondary | ICD-10-CM | POA: Diagnosis not present

## 2016-06-22 DIAGNOSIS — I700 Atherosclerosis of aorta: Secondary | ICD-10-CM | POA: Diagnosis not present

## 2016-06-24 DIAGNOSIS — Z438 Encounter for attention to other artificial openings: Secondary | ICD-10-CM | POA: Diagnosis not present

## 2016-06-24 DIAGNOSIS — I10 Essential (primary) hypertension: Secondary | ICD-10-CM | POA: Diagnosis not present

## 2016-06-24 DIAGNOSIS — Z452 Encounter for adjustment and management of vascular access device: Secondary | ICD-10-CM | POA: Diagnosis not present

## 2016-06-24 DIAGNOSIS — J45909 Unspecified asthma, uncomplicated: Secondary | ICD-10-CM | POA: Diagnosis not present

## 2016-06-24 DIAGNOSIS — L02211 Cutaneous abscess of abdominal wall: Secondary | ICD-10-CM | POA: Diagnosis not present

## 2016-06-24 DIAGNOSIS — I700 Atherosclerosis of aorta: Secondary | ICD-10-CM | POA: Diagnosis not present

## 2016-06-26 ENCOUNTER — Inpatient Hospital Stay: Admission: RE | Admit: 2016-06-26 | Payer: Medicare Other | Source: Ambulatory Visit

## 2016-06-26 ENCOUNTER — Telehealth: Payer: Self-pay | Admitting: Pharmacist

## 2016-06-26 ENCOUNTER — Other Ambulatory Visit: Payer: Self-pay | Admitting: Pharmacist

## 2016-06-26 ENCOUNTER — Other Ambulatory Visit: Payer: Medicare Other

## 2016-06-26 MED ORDER — AMOXICILLIN-POT CLAVULANATE 875-125 MG PO TABS: 1.0000 | ORAL_TABLET | Freq: Two times a day (BID) | ORAL | 0 refills | Status: DC

## 2016-06-26 NOTE — Telephone Encounter (Signed)
Pt on IV ceftriaxone for E coli intra-abdominal abscess. Her end date for ceftriaxone was to be 11/19 likely followed by oral antibiotics. Pt has a port, which is not working at the moment. She thinks it is twisted, has been trying to get in to see IR.  Spoke with Dr. Baxter Flattery, will change the patient to Augmentin 875-125 mg BID. Will send in 1 month Rx - she has a f/u appt with Dr. Johnnye Sima on 11/27. He can reassess at that point if she needs more. Dr. Baxter Flattery knows and in agrees.

## 2016-06-27 ENCOUNTER — Ambulatory Visit (INDEPENDENT_AMBULATORY_CARE_PROVIDER_SITE_OTHER): Payer: Medicare Other | Admitting: Cardiovascular Disease

## 2016-06-27 ENCOUNTER — Telehealth: Payer: Self-pay | Admitting: *Deleted

## 2016-06-27 ENCOUNTER — Telehealth: Payer: Self-pay

## 2016-06-27 ENCOUNTER — Encounter: Payer: Self-pay | Admitting: Cardiovascular Disease

## 2016-06-27 VITALS — BP 152/80 | HR 62 | Ht 62.0 in | Wt 283.4 lb

## 2016-06-27 DIAGNOSIS — I480 Paroxysmal atrial fibrillation: Secondary | ICD-10-CM

## 2016-06-27 DIAGNOSIS — Z79899 Other long term (current) drug therapy: Secondary | ICD-10-CM | POA: Diagnosis not present

## 2016-06-27 DIAGNOSIS — E7800 Pure hypercholesterolemia, unspecified: Secondary | ICD-10-CM | POA: Diagnosis not present

## 2016-06-27 DIAGNOSIS — I10 Essential (primary) hypertension: Secondary | ICD-10-CM

## 2016-06-27 MED ORDER — HYDROCHLOROTHIAZIDE 12.5 MG PO TABS: ORAL_TABLET | ORAL | 5 refills | Status: DC

## 2016-06-27 MED ORDER — POTASSIUM CHLORIDE ER 10 MEQ PO TBCR: EXTENDED_RELEASE_TABLET | ORAL | 5 refills | Status: DC

## 2016-06-27 NOTE — Telephone Encounter (Signed)
Received message from [facility] [place] confirming that patient has switched from IV to Oral antibiotic therapy. Per Dr Baxter Flattery patient no longer needs labs. She will still need nurse visits for wound/drain care as long as the drain is in place. Per [name], patient will see IR tomorrow for drain assessment and planning, as well as for port assessment to find out why it is not infusing or drawing back. Landis Gandy, RN

## 2016-06-27 NOTE — Progress Notes (Signed)
Cardiology Office Note   Date:  06/27/2016   ID:  Suzanne Macdonald, DOB 04-03-1951, MRN [id number]  PCP:  [name], MD  Cardiologist:   [name], MD   Chief Complaint  Patient presents with  . Follow-up    3 months, pt denied chest pain      History of Present Illness: Suzanne Macdonald is a 65 y.o. female with paroxysmal atrial fibrillation, hypertension, hyperlipidemia, OSA, and morbid obesity status post gastric bypass surgery who presents for follow-up.  Suzanne Macdonald was diagnosed with atrial fibrillation in 2014.  For unclear reasons, she was not on anticoagulation.  She was admitted to the hospital 02/2016 with atrial fibrillation with rapid ventricular response with rates near 170.  She converted spontaneously to sinus rhythm.  She was started on flecainide and Eliquis and continued on atenolol. Echo that admission revealed LVEF 55-60%.  Suzanne Macdonald underwent cardiac catheterization in [place] on 06/02/14 prior to undergoing colon cancer surgery. Records were reviewed and she had no significant blockages at that time.  She was seen in clinic 03/2016 with plans for hernia repair.  She was referred for [facility] 04/15/16 both for presurgical risk assessment as well as because of the use of flecainide. This was negative for ischemia and revealed LVEF 66%.  Since her lat appointment Suzanne Macdonald was admitted to th hospital for an abdominal wall abscess.  She had a drain placed by IR and was receiving IV antibiotics.  This was switched to oral antibiotics because her power pick is not functioning properly.  While in the hospital she was noted to have atrial fibrillation with rapid ventricular response with rates up into the 140s. Her flecainide was increased to 100 mg twice daily.  Hydrochlorothiazide was discontinued and she was started on diltiazem. When she went to the pharmacy to pick up diltiazem she thought that given her someone else's medications by mistake.  She  decided to throw the medication away. She followed up with her PCP who re-ordered the medication.  However her insurance will not fill it for another 3 weeks.   Since discharge Suzanne Macdonald has been feeling tired. She continues to have short episodes of heart racing, though less frequent than before going to the hospital. When it happens it lasts for a couple minutes at a time. She notes increased swelling in her legs since stopping hydrochlorothiazide. She has difficulty with mobility and feels very tired by the time she walks from her car to a store. Therefore she has been only shopping online. After walking from one room to the next in her home she gets very tired and short of breath. She denies orthopnea or PND. She plans to get a treadmill so that she can start exercising in her home.    Past Medical History:  Diagnosis Date  . Asthma   . Atrial fibrillation ([place])   . Cervical cancer ([place]) 1972  . Colon cancer ([place])   . H/O gastric bypass   . Hyperlipidemia   . Hypertension   . Morbid obesity ([place]) 03/04/2016  . Sleep apnea     Past Surgical History:  Procedure Laterality Date  . ABDOMINAL HYSTERECTOMY  1998   Salpingoophorectomy. For tumorous growth.  . BOWEL RESECTION  2015   [place] [place] by Dr. Stormy Fabian  . GASTRIC BYPASS  1992  . INSERTION OF MESH N/A 05/01/2016   Procedure: INSERTION OF MESH;  Surgeon: Excell Seltzer, MD;  Location: [facility];  Service: General;  Laterality: N/A;  .  LAPAROSCOPIC GASTRIC BANDING     Placed 2013 and removed in 2014.  Marland Kitchen VENTRAL HERNIA REPAIR N/A 05/01/2016   Procedure: VENTRAL HERNIA REPAIR;  Surgeon: Excell Seltzer, MD;  Location: [place] OR;  Service: General;  Laterality: N/A;     Current Outpatient Prescriptions  Medication Sig Dispense Refill  . acetaminophen (TYLENOL) 500 MG tablet Take 1,000 mg by mouth every 6 (six) hours as needed for mild pain or moderate pain.    Marland Kitchen acetaminophen-codeine (TYLENOL #3) 300-30 MG tablet Take 1-2 tablets by  mouth every 4 (four) hours as needed for moderate pain or severe pain. 60 tablet 0  . albuterol (PROVENTIL HFA;VENTOLIN HFA) 108 (90 Base) MCG/ACT inhaler Inhale 1-2 puffs into the lungs every 6 (six) hours as needed for wheezing or shortness of breath.    Marland Kitchen amoxicillin-clavulanate (AUGMENTIN) 875-125 MG tablet Take 1 tablet by mouth 2 (two) times daily. 60 tablet 0  . apixaban (ELIQUIS) 5 MG TABS tablet Take 1 tablet (5 mg total) by mouth 2 (two) times daily. 180 tablet 1  . atorvastatin (LIPITOR) 10 MG tablet take 1 tablet by mouth once daily 90 tablet 1  . budesonide-formoterol (SYMBICORT) 160-4.5 MCG/ACT inhaler Inhale 2 puffs into the lungs 2 (two) times daily. 1 Inhaler 1  . Calcium-Phosphorus-Vitamin D (CALCIUM/VITAMIN D3/ADULT GUMMY PO) Take 2 each by mouth daily.    . carvedilol (COREG) 3.125 MG tablet Take 3 tablets (9.375 mg total) by mouth 2 (two) times daily with a meal. 60 tablet 0  . cholecalciferol (VITAMIN D) 1000 units tablet Take 2,000 Units by mouth daily.     . cyanocobalamin 500 MCG tablet Take 1,000 mcg by mouth daily.     Marland Kitchen docusate sodium (COLACE) 100 MG capsule Take 1 capsule (100 mg total) by mouth 2 (two) times daily as needed for mild constipation. 10 capsule 0  . flecainide (TAMBOCOR) 100 MG tablet Take 1 tablet (100 mg total) by mouth 2 (two) times daily. 60 tablet 0  . fluconazole (DIFLUCAN) 400-0.9 MG/200ML-% IVPB Inject 200 mLs (400 mg total) into the vein daily. 5000 mL 0  . lisinopril (PRINIVIL,ZESTRIL) 5 MG tablet Take 1 tablet (5 mg total) by mouth daily. 30 tablet 0  . Multiple Vitamins-Minerals (ALIVE WOMENS GUMMY) CHEW Chew 2 each by mouth daily.    Marland Kitchen omeprazole (PRILOSEC) 40 MG capsule Take 1 capsule (40 mg total) by mouth daily. 90 capsule 1  . ondansetron (ZOFRAN-ODT) 8 MG disintegrating tablet Take 1 tablet (8 mg total) by mouth every 8 (eight) hours as needed for nausea or vomiting. 10 tablet 0  . polyethylene glycol (MIRALAX / GLYCOLAX) packet Take 17 g  by mouth daily. 14 each 0  . senna-docusate (SENOKOT-S) 8.6-50 MG tablet Take 1 tablet by mouth at bedtime as needed for mild constipation. 30 tablet 0  . traZODone (DESYREL) 100 MG tablet Take 100 mg by mouth at bedtime.  0  . hydrochlorothiazide (HYDRODIURIL) 12.5 MG tablet 1/2 TABLET BY MOUTH DAILY 15 tablet 5  . potassium chloride (K-DUR) 10 MEQ tablet 2 TABLETS BY MOUTH DAILY 60 tablet 5   No current facility-administered medications for this visit.     Allergies:   [place] [shellfish allergy]    Social History:  The patient  reports that she has quit smoking. Her smoking use included Cigarettes. She quit after 0.20 years of use. She has never used smokeless tobacco. She reports that she does not drink alcohol or use drugs.   Family History:  The  patient's family history includes Diabetes in her father, mother, sister, and sister; Drug abuse in her brother; Early death in her brother; Heart attack in her brother and brother; Heart disease in her father and mother; Stroke in her brother and father; Thyroid disease in her daughter, sister, and sister.    ROS:  Please see the history of present illness.   Otherwise, review of systems are positive for none.   All other systems are reviewed and negative.   PHYSICAL EXAM: VS:  BP (!) 152/80   Pulse 62   Ht '5\' 2"'$  (1.575 m)   Wt 128.5 kg (283 lb 6.4 oz)   BMI 51.83 kg/m  , BMI Body mass index is 51.83 kg/m. GENERAL:  Well appearing HEENT:  Pupils equal round and reactive, fundi not visualized, oral mucosa unremarkable NECK:  No jugular venous distention, waveform within normal limits, carotid upstroke brisk and symmetric, no bruits LYMPHATICS:  No cervical adenopathy LUNGS:  Clear to auscultation bilaterally HEART:  RRR.  PMI not displaced or sustained,S1 and S2 within normal limits, no S3, no S4, no clicks, no rubs, no murmurs ABD:  Flat, positive bowel sounds normal in frequency in pitch, no bruits, no rebound, no guarding, no  midline pulsatile mass, no hepatomegaly, no splenomegaly.  R abdominal drain in place EXT:  2 plus pulses throughout, 1+ pitting edema, no cyanosis no clubbing SKIN:  No rashes no nodules NEURO:  Cranial nerves II through XII grossly intact, motor grossly intact throughout PSYCH:  Cognitively intact, oriented to person place and time   EKG:  EKG is ordered today. The ekg ordered today demonstrates sinus bradycardia rate 49 bpm.     Recent Labs: 03/02/2016: B Natriuretic Peptide 98.9; TSH 1.964 06/09/2016: ALT 11 06/11/2016: Magnesium 1.7 06/12/2016: BUN 11; Creatinine, Ser 0.82; Potassium 3.5; Sodium 138 06/14/2016: Hemoglobin 8.9; Platelets 326    Echo 03/04/16: Study Conclusions  - Left ventricle: The cavity size was normal. Wall thickness was   normal. Systolic function was normal. The estimated ejection   fraction was in the range of 55% to 60%. Wall motion was normal;   there were no regional wall motion abnormalities. Doppler   parameters are consistent with abnormal left ventricular   relaxation (grade 1 diastolic dysfunction). - Aortic valve: There was no stenosis. - Mitral valve: Mildly calcified annulus. There was no significant   regurgitation. - Left atrium: The atrium was mildly dilated. - Right ventricle: The cavity size was normal. Systolic function   was normal.   Lexiscan Myoview 04/13/16:   The left ventricular ejection fraction is hyperdynamic (>65%).  Nuclear stress EF: 66%.  There was no ST segment deviation noted during stress.  The study is normal.  This is a low risk study.   Low risk stress nuclear study with normal perfusion and normal left ventricular regional and global systolic function.  Lipid Panel    Component Value Date/Time   CHOL 227 (H) 01/24/2016 0952   TRIG 131 01/24/2016 0952   HDL 50 01/24/2016 0952   CHOLHDL 4.5 (H) 01/24/2016 0952   LDLCALC 151 (H) 01/24/2016 0952      Wt Readings from Last 3 Encounters:  06/27/16  128.5 kg (283 lb 6.4 oz)  06/20/16 128.9 kg (284 lb 3.2 oz)  06/10/16 129.4 kg (285 lb 4.8 oz)      ASSESSMENT AND PLAN:  # Paroxysmal atrial fibrillation: Ms. Buffin reports less frequent palpitations since increasing flecainide.  She has not yet on diltiazem as  her insurance will not fill the medicine for another 3 weeks. Her heart rate is well-controlled today and she is in sinus rhythm.  Continue flecainide 100 mg twice daily and carvedilol 9.375 mg twice daily. She will start diltiazem when she is able to obtain it.  Continue Eliquis.  # Hyperlipidemia: Lipids are above goal.  Repeat lipids at follow up.  Continue atorvastatin for now.  # Hypertension: BP is above goal.  She also noted increased swelling since stopping HCTZ.  We will start HCTZ 6.'25mg'$  daily with 20 mEq of potassium chloride.  Continue carvedilol and lisinopril.  Hopefully we can stop lisinopril once she is on diltiazem.  We will check a BMP in 1 week.    Current medicines are reviewed at length with the patient today.  The patient does not have concerns regarding medicines.  The following changes have been made:  Start HCTZ and potassium.   Labs/ tests ordered today include:   Orders Placed This Encounter  Procedures  . Basic metabolic panel     Disposition:   FU with Rosevelt Luu C. Oval Linsey, MD, [facility] in 1 month.    This note was written with the assistance of speech recognition software.  Please excuse any transcriptional errors.   Signed, Kaly Mcquary C. Oval Linsey, MD, [facility]  06/27/2016 9:46 AM    [facility]

## 2016-06-27 NOTE — Addendum Note (Signed)
Addended by: Alvina Filbert B on: 06/27/2016 11:36 AM   Modules accepted: Orders

## 2016-06-27 NOTE — Patient Instructions (Addendum)
Medication Instructions:  START HYDROCHLOROTHIAZIDE 12.5 MG 1/2 TABLET DAILY  START POTASSIUM 10 MEQ 2 TABLETS DAILY  Labwork: FASTING LP/CMET IN 1 WEEK AT [facility] ON THE FIRST FLOOR  Testing/Procedures: NONE  Follow-Up: Your physician recommends that you schedule a follow-up appointment in: [place] ON [facility] TO ORDER A SCOOTER FOR YOU   If you need a refill on your cardiac medications before your next appointment, please call your pharmacy.

## 2016-06-28 ENCOUNTER — Ambulatory Visit
Admission: RE | Admit: 2016-06-28 | Discharge: 2016-06-28 | Disposition: A | Discharge status: Home / Self Care | Payer: Medicare Other | Source: Ambulatory Visit | Attending: General Surgery | Admitting: General Surgery

## 2016-06-28 ENCOUNTER — Other Ambulatory Visit (HOSPITAL_COMMUNITY): Payer: Self-pay | Admitting: Diagnostic Radiology

## 2016-06-28 DIAGNOSIS — K439 Ventral hernia without obstruction or gangrene: Secondary | ICD-10-CM | POA: Diagnosis not present

## 2016-06-28 DIAGNOSIS — K651 Peritoneal abscess: Secondary | ICD-10-CM | POA: Diagnosis not present

## 2016-06-28 DIAGNOSIS — L02211 Cutaneous abscess of abdominal wall: Secondary | ICD-10-CM

## 2016-06-28 HISTORY — PX: IR GENERIC HISTORICAL: IMG1180011

## 2016-06-28 MED ORDER — IOPAMIDOL (ISOVUE-300) INJECTION 61%
125.0000 mL | Freq: Once | INTRAVENOUS | Status: AC | PRN
  Administered 2016-06-28: 125 mL via INTRAVENOUS

## 2016-06-28 NOTE — Consult Note (Signed)
Chief Complaint: Patient was seen in consultation today for drain follow-up   Referring Physician(s): Rolm Bookbinder  History of Present Illness: Suzanne Macdonald is a 65 y.o. female with a complex medical history including colon cancer. Patient had repair of an incarcerated ventral hernia in September 2017. Patient was found to have a large fluid collection along the anterior abdominal wall compatible with expected postoperative changes from the ventral hernia repair. However, the patient also had an air-fluid collection along the right lateral abdominal wall which was concerning for a abscess and an underlying bowel leak within the intra-abdominal cavity. The patient underwent placement of a percutaneous drain on 06/11/2016. Abscess culture grew Escherichia coli. The patient has been discharged from the hospital and says that she is doing fairly well. She does have pain at the drain site. She denies fevers or chills. Patient has been flushing the drain once a day with 5 mL of saline. She reports 10-15 mL of drainage every 24 hours.The patient has a right chest Port-A-Cath that she had placed a couple years ago for her colon cancer. She says that her home health care nurses are unable to use the port. She says the port will not aspirate or flash. As a result, the patient has been switched to oral antibiotics which she has not started yet. Patient was seen in cardiology yesterday.  Past Medical History:  Diagnosis Date  . Asthma   . Atrial fibrillation ([place])   . Cervical cancer ([place]) 1972  . Colon cancer ([place])   . H/O gastric bypass   . Hyperlipidemia   . Hypertension   . Morbid obesity ([place]) 03/04/2016  . Sleep apnea     Past Surgical History:  Procedure Laterality Date  . ABDOMINAL HYSTERECTOMY  1998   Salpingoophorectomy. For tumorous growth.  . BOWEL RESECTION  2015   [place], [place] by Dr. Stormy Fabian  . GASTRIC BYPASS  1992  . INSERTION OF MESH N/A 05/01/2016   Procedure:  INSERTION OF MESH;  Surgeon: Excell Seltzer, MD;  Location: [facility];  Service: General;  Laterality: N/A;  . LAPAROSCOPIC GASTRIC BANDING     Placed 2013 and removed in 2014.  Marland Kitchen VENTRAL HERNIA REPAIR N/A 05/01/2016   Procedure: VENTRAL HERNIA REPAIR;  Surgeon: Excell Seltzer, MD;  Location: [facility];  Service: General;  Laterality: N/A;    Allergies: Scallops [shellfish allergy]  Medications: Prior to Admission medications   Medication Sig Start Date End Date Taking? Authorizing Provider  acetaminophen (TYLENOL) 500 MG tablet Take 1,000 mg by mouth every 6 (six) hours as needed for mild pain or moderate pain.    Historical Provider, MD  acetaminophen-codeine (TYLENOL #3) 300-30 MG tablet Take 1-2 tablets by mouth every 4 (four) hours as needed for moderate pain or severe pain. 05/06/16   Bethany Molt, DO  albuterol (PROVENTIL HFA;VENTOLIN HFA) 108 (90 Base) MCG/ACT inhaler Inhale 1-2 puffs into the lungs every 6 (six) hours as needed for wheezing or shortness of breath.    Historical Provider, MD  amoxicillin-clavulanate (AUGMENTIN) 875-125 MG tablet Take 1 tablet by mouth 2 (two) times daily. 06/26/16   Carlyle Basques, MD  apixaban (ELIQUIS) 5 MG TABS tablet Take 1 tablet (5 mg total) by mouth 2 (two) times daily. 03/13/16   Shela Leff, MD  atorvastatin (LIPITOR) 10 MG tablet take 1 tablet by mouth once daily 04/04/16   Shela Leff, MD  budesonide-formoterol [facility]) 160-4.5 MCG/ACT inhaler Inhale 2 puffs into the lungs 2 (two) times daily. 01/24/16  Milagros Loll, MD  Calcium-Phosphorus-Vitamin D (CALCIUM/VITAMIN D3/ADULT GUMMY PO) Take 2 each by mouth daily.    Historical Provider, MD  carvedilol (COREG) 3.125 MG tablet Take 3 tablets (9.375 mg total) by mouth 2 (two) times daily with a meal. 06/14/16   Ledell Noss, MD  cholecalciferol (VITAMIN D) 1000 units tablet Take 2,000 Units by mouth daily.     Historical Provider, MD  cyanocobalamin 500 MCG tablet Take 1,000 mcg by mouth  daily.     Historical Provider, MD  docusate sodium (COLACE) 100 MG capsule Take 1 capsule (100 mg total) by mouth 2 (two) times daily as needed for mild constipation. 06/14/16   Ledell Noss, MD  flecainide (TAMBOCOR) 100 MG tablet Take 1 tablet (100 mg total) by mouth 2 (two) times daily. 06/14/16   Ledell Noss, MD  fluconazole (DIFLUCAN) 400-0.9 MG/200ML-% IVPB Inject 200 mLs (400 mg total) into the vein daily. 06/14/16 07/09/16  Ledell Noss, MD  hydrochlorothiazide (HYDRODIURIL) 12.5 MG tablet 1/2 TABLET BY MOUTH DAILY 06/27/16   [name], MD  lisinopril (PRINIVIL,ZESTRIL) 5 MG tablet Take 1 tablet (5 mg total) by mouth daily. 06/15/16   Ledell Noss, MD  Multiple Vitamins-Minerals (ALIVE WOMENS GUMMY) CHEW Chew 2 each by mouth daily.    Historical Provider, MD  omeprazole (PRILOSEC) 40 MG capsule Take 1 capsule (40 mg total) by mouth daily. 04/27/16   [name], MD  ondansetron (ZOFRAN-ODT) 8 MG disintegrating tablet Take 1 tablet (8 mg total) by mouth every 8 (eight) hours as needed for nausea or vomiting. 01/22/16   Davonna Belling, MD  polyethylene glycol [facility] / GLYCOLAX) packet Take 17 g by mouth daily. 05/06/16   Bethany Molt, DO  potassium chloride (K-DUR) 10 MEQ tablet 2 TABLETS BY MOUTH DAILY 06/27/16   [name], MD  senna-docusate (SENOKOT-S) 8.6-50 MG tablet Take 1 tablet by mouth at bedtime as needed for mild constipation. 05/06/16   Bethany Molt, DO  traZODone (DESYREL) 100 MG tablet Take 100 mg by mouth at bedtime. 04/22/16   Historical Provider, MD     Family History  Problem Relation Age of Onset  . Heart disease Mother   . Diabetes Mother   . Heart disease Father   . Stroke Father   . Diabetes Father   . Diabetes Sister   . Thyroid disease Sister   . Early death Brother     Killed by drunk driver  . Heart attack Brother   . Thyroid disease Daughter     s/p thyroidectomy  . Diabetes Sister   . Thyroid disease Sister   . Stroke Brother   . Drug abuse Brother    . Heart attack Brother     Social History   Social History  . Marital status: Married    Spouse name: N/A  . Number of children: N/A  . Years of education: N/A   Social History Main Topics  . Smoking status: Former Smoker    Years: 0.20    Types: Cigarettes  . Smokeless tobacco: Never Used  . Alcohol use No     Comment: a beer every now and then  . Drug use: No  . Sexual activity: No   Other Topics Concern  . Not on file   Social History Narrative  . No narrative on file    Review of Systems  Constitutional: Negative for chills and fever.  Gastrointestinal: Positive for abdominal pain.    Vital Signs: BP (!) 191/86 (BP Location: Right  Arm, Patient Position: Sitting, Cuff Size: Normal)   Pulse (!) 55   Temp 98.2 F (36.8 C) (Oral)   Resp 16   SpO2 96%   Physical Exam  Cardiovascular:  The right chest Port-A-Cath is palpable and appears to be appropriately positioned. There is no erythema or drainage at this site.  Abdominal: Soft. There is tenderness.  The drainage catheter in the right lateral abdomen is intact. The sutures are still intact. The bandage was removed and the surrounding skin appears healthy. There is no drainage around the catheter.  There is approximately 10 mL of cloudy yellow fluid within the suction bulb.        Imaging: Dg Chest 2 View  Result Date: 06/09/2016 CLINICAL DATA:  Abdominal pain and fever. Hernia surgery 2 weeks ago. EXAM: CHEST  2 VIEW COMPARISON:  03/02/2016 FINDINGS: Right subclavian Port-A-Cath remains in place with tip overlying the lower SVC. The cardiomediastinal silhouette is within normal limits. There is no evidence of airspace consolidation, edema, pleural effusion, or pneumothorax. Thoracic spondylosis is noted. IMPRESSION: No active cardiopulmonary disease. Electronically Signed   By: Logan Bores M.D.   On: 06/09/2016 21:38   Ct Abdomen Pelvis W Contrast  Result Date: 06/28/2016 CLINICAL DATA:  65 year old  with complex medical history, including colon cancer. The patient had repair of an incarcerated ventral hernia on 05/01/2016 with mesh. Approximately 2 weeks ago, the patient presented with a large abdominal fluid collection associated with the mesh. This was thought to be a sterile postoperative fluid collection. However, the patient also had a new air-fluid collection along the right lateral abdominal wall. This right abdominal fluid collection raised concern for an underlying bowel leak. Abscess treated with a percutaneous drain. Patient reports minimal drainage from the right lateral abdominal drain. EXAM: CT ABDOMEN AND PELVIS WITH CONTRAST TECHNIQUE: Multidetector CT imaging of the abdomen and pelvis was performed using the standard protocol following bolus administration of intravenous contrast. CONTRAST:  160m ISOVUE-300 IOPAMIDOL (ISOVUE-300) INJECTION 61% COMPARISON:  06/09/2016 FINDINGS: Lower chest: There appears to be a 4 mm calcified granuloma along the medial right middle lobe. Lung bases are clear. No pleural effusions. Hepatobiliary: Low-attenuation of the liver probably represents hepatic steatosis. Gallbladder is decompressed. The portal venous system is patent. No focal liver lesion. Pancreas: Normal appearance of the pancreas without inflammation or duct dilatation. Spleen: Normal appearance of spleen without enlargement. Adrenals/Urinary Tract: Stable fullness in the left adrenal gland. Normal appearance of the right adrenal gland. Normal appearance of both kidneys without hydronephrosis. Urinary bladder is unremarkable. Stomach/Bowel: Patient had a gastric bypass procedure. Surgical anastomosis in the distal sigmoid colon region. Again noted are anastomotic staples in the small bowel in the right lower quadrant. This small bowel anastomosis area is probably adhered to the right lateral abdominal wall. Previously, there was a tract connecting the intra-abdominal structures with the  subcutaneous abscess collection. There is no longer a definite tract between these areas. There is an air-fluid structure associated with the bowel in this area on sequence 3, image 45. This area measures 2.2 x 2.0 cm and previously measured 3.3 x 2.5 cm. This could represent normal bowel but difficult to exclude a small intra-abdominal abscess at this location. There is no focal areas of bowel inflammation. No evidence for a bowel obstruction. Vascular/Lymphatic: Atherosclerotic calcifications in the aorta without aneurysm. No significant lymph node enlargement in the abdomen or pelvis. Reproductive: Status post hysterectomy. No adnexal masses. Other: No evidence for free fluid in the abdomen  or pelvis. Again noted is a fluid collection along the anterior abdominal wall related to the ventral hernia repair. This anterior abdominal fluid collection has markedly decreased in size. The depth or thickness of this fluid collection measures up to 2.2 cm and previously measured 5.0 cm. The collection covers a large portion of the anterior abdomen. No gas within this anterior abdominal fluid collection. Again noted are surgical changes along the anterior abdominal wall and anterior subcutaneous tissues. There is now a pigtail catheter within the right lateral anterior soft tissues. The gas-filled abscess collection in the right lateral abdominal soft tissues has resolved. There is mild soft tissue thickening in this area but no evidence for a residual cavity. No evidence for free intraperitoneal air. Musculoskeletal: No acute bone abnormality. There is facet arthropathy at L4-L5 and L5-S1. IMPRESSION: The subcutaneous abscess collection along the right lateral abdominal wall has resolved with the pigtail drainage catheter. Previously, there was a tract between this superficial collection and the small bowel in the right lateral abdomen. There is no obvious tract seen on CT. There is an indeterminate air-fluid collection in  the right lateral abdomen adjacent to the small bowel anastomosis. This structure has decreased in size from the prior examination. Small intra-abdominal abscess collection cannot be excluded in this area. Recommend injection of the pigtail catheter prior to removal in order to exclude a fistula tract between the drainage catheter and the intra-abdominal cavity. Patient may also benefit from a follow-up CT to ensure this small air-fluid collection does not enlarge over time. The fluid collection along the anterior abdominal wall associated with the ventral hernia repair has markedly decreased in size. There is no gas or other concerning features in this residual collection. Probable hepatic steatosis. Electronically Signed   By: Markus Daft M.D.   On: 06/28/2016 09:29   Ct Abdomen Pelvis W Contrast  Result Date: 06/09/2016 CLINICAL DATA:  65 year old female with abdominal pain and fever. History of hernia surgery 2 weeks ago. EXAM: CT ABDOMEN AND PELVIS WITH CONTRAST TECHNIQUE: Multidetector CT imaging of the abdomen and pelvis was performed using the standard protocol following bolus administration of intravenous contrast. CONTRAST:  134m ISOVUE-300 IOPAMIDOL (ISOVUE-300) INJECTION 61% COMPARISON:  Abdominal CT dated 05/09/2016 FINDINGS: Lower chest: The visualized lung bases are clear. No intra-abdominal free air or free fluid. Hepatobiliary: Diffuse fatty infiltration of the liver. No intrahepatic biliary ductal dilatation. The gallbladder is unremarkable. Pancreas: Unremarkable. No pancreatic ductal dilatation or surrounding inflammatory changes. Spleen: Normal in size without focal abnormality. Adrenals/Urinary Tract: There is a 1.5 cm left adrenal hypodense nodule measuring less than 10 Hounsfield units most compatible with an adenoma. The right adrenal gland appears unremarkable. The kidneys, visualized ureters, and urinary bladder appear unremarkable. Stomach/Bowel: There postsurgical changes of gastric  bypass with multiple anastomotic sutures in the small bowel. There is also partial resection of the sigmoid colon. No evidence of bowel obstruction or active inflammation. Normal appendix. Vascular/Lymphatic: There is mild moderate aortoiliac atherosclerotic disease. The abdominal aorta and IVC are otherwise unremarkable. No portal venous gas identified. There is no adenopathy. Reproductive: Hysterectomy. Other: There is a midline vertical anterior pelvic wall incisional scar. Postsurgical changes of ventral hernia repair noted. There is a large fluid collection in the anterior abdominal wall along measuring approximately 21 x 5 cm in greatest axial dimension and 22 cm in the craniocaudal length similar to the prior CT. There may be minimal enhancement of the adjacent capsule concerning for an infected collection. A 5.0 x  7.2 cm collection noted containing air-fluid level in the right anterior abdominal wall lateral to the larger collection. This collection appears to be more superficial and likely in the subcutaneous tissues and superficial to the external oblique musculature. This collection is new since the prior CT. There is an apparent tract like communication extending from this collection to the deeper soft tissues and through the abdominal fascia and musculature and abutting the small bowel anastomosis in the right anterior abdominal wall. There is also apparent communication with the larger collection in the anterior abdominal wall. Although the smaller collection in the lateral right anterior abdominal wall may be extension of the larger collection in the midline, dehiscence of in the adjacent small bowel anastomosis is not entirely excluded. Further evaluation with CT with oral contrast is recommended. There is diffuse stranding of the subcutaneous soft tissues of the anterior abdominal wall. Musculoskeletal: Multilevel degenerative changes of the spine. No acute fracture. IMPRESSION: Persistent large  loculated fluid in the anterior abdominal wall at the site of hernia repair concerning for infected fluid. There has been interval development of a smaller collection with air and fluid level in the right anterior abdominal wall laterally which appears to communicate with the larger collection in the midline anterior abdomen. A tract like communication also extends from the smaller collection in the right lateral anterior abdominal wall and abuts the adjacent small bowel anastomosis. Dehiscence of the anastomotic suture as the cause of fluid collection is not entirely excluded. Further evaluation with CT with oral contrast is recommended. Electronically Signed   By: Anner Crete M.D.   On: 06/09/2016 23:29   Ct Image Guided Drainage By Percutaneous Catheter  Result Date: 06/11/2016 INDICATION: 65 year old with history of ventral hernia repair secondary to an incarcerated ventral hernia. Patient is complaining of worsening right abdominal pain. Recent CT demonstrates an air-fluid collection in the right abdominal subcutaneous tissues. Request for CT-guided aspiration and drainage. EXAM: CT GUIDED DRAINAGE OF SUBCUTANEOUS ABDOMINAL WALL ABSCESS MEDICATIONS: None ANESTHESIA/SEDATION: 1.0 mg IV Versed 50 mcg IV Fentanyl Moderate Sedation Time:  25 minutes The patient was continuously monitored during the procedure by the interventional radiology nurse under my direct supervision. COMPLICATIONS: None immediate. TECHNIQUE: Informed written consent was obtained from the patient after a thorough discussion of the procedural risks, benefits and alternatives. All questions were addressed. A timeout was performed prior to the initiation of the procedure. PROCEDURE: Patient was placed supine. CT images through the abdomen were obtained. The large air-fluid collection in the right lateral abdominal subcutaneous tissues was targeted. The right side of the abdomen was prepped with chlorhexidine and sterile field was  created. Using CT guidance, 18 gauge needle was directed into this air-fluid collection. Approximately 20 mL of gas was aspirated. 5 mL of yellow purulent fluid was removed. A stiff Amplatz wire was advanced into the collection and the tract was dilated to accommodate a 10.2 [place] multipurpose drain. Catheter was attached to a suction bulb. Catheter was sutured to the skin. FINDINGS: Collection in the right lateral abdominal subcutaneous tissues. This collection is predominantly made of gas. Small amount yellow purulent fluid removed from this collection. Again noted is a large low-density collection along the anterior abdominal wall probably related to a postoperative seroma. The right lateral abdominal collection was completely decompressed following placement of the catheter. This collection does not appear to communicate with the larger adjacent anterior collection. IMPRESSION: Successful placement of CT-guided drain within the right lateral abdominal subcutaneous collection. Small amount of purulent  fluid was removed and sent for culture. Electronically Signed   By: Markus Daft M.D.   On: 06/11/2016 17:32    Labs:  CBC:  Recent Labs  06/11/16 0600 06/12/16 0600 06/13/16 0506 06/14/16 0450  WBC 11.5* 8.2 6.3 8.6  HGB 8.7* 8.6* 8.2* 8.9*  HCT 28.2* 28.5* 27.3* 30.0*  PLT 257 283 273 326    COAGS:  Recent Labs  06/11/16 0906  INR 1.28    BMP:  Recent Labs  05/09/16 0350 06/09/16 1928 06/11/16 0600 06/12/16 0415  NA 138 135 134* 138  K 2.8* 3.0* 3.1* 3.5  CL 103 100* 99* 106  CO2 '23 25 26 26  '$ GLUCOSE 93 125* 138* 110*  BUN '7 12 13 11  '$ CALCIUM 8.1* 8.5* 7.9* 7.7*  CREATININE 0.77 0.78 0.90 0.82  GFRNONAA >60 >60 >60 >60  GFRAA >60 >60 >60 >60    LIVER FUNCTION TESTS:  Recent Labs  01/21/16 2325 01/24/16 0952 04/28/16 1439 06/09/16 1928  BILITOT 0.6 0.3 0.5 1.1  AST 33 35 43* 19  ALT 34 30 46 11*  ALKPHOS 88 94 67 72  PROT 6.2* 6.3 5.9* 6.0*  ALBUMIN 3.3*  3.9 3.5 2.9*    TUMOR MARKERS: No results for input(s): AFPTM, CEA, CA199, CHROMGRNA in the last 8760 hours.  Assessment and Plan:  1) Right lateral abdominal abscess: Today's CT confirms that the abscess in the right lateral subcutaneous tissue has resolved. Patient also reports minimal drainage from the catheter. However, the drainage is cloudy. The CT also raises concern for a small air-fluid collection in the right lateral intra-abdominal cavity which could represent a small intra-abdominal abscess adjacent to the surgical anastomosis. There was evidence for a fistula tract between the intra-abdominal cavity and the subcutaneous abscess on the prior CT. Therefore, plan and have the abscess drainage injected with contrast under fluoroscopy to exclude a fistula tract to the intra-abdominal cavity and small bowel. If there is no evidence for a fistula tract, the catheter can be removed. Patient will benefit from a follow-up CT when she completes antibiotics to ensure that there is not an enlarging intra-abdominal abscess.  2) Port-A-Cath dysfunction: Patient's right Port-A-Cath is not functioning according to the patient. We will try to schedule the patient for port injection when she goes in for the drain injection.  3) Hypertension: Patient is hypertensive at today's visit. Patient's hypertension is being managed by cardiology.   Thank you for this interesting consult.  I greatly enjoyed meeting Torrey Horseman and look forward to participating in their care.  A copy of this report was sent to the requesting provider on this date.  Electronically Signed: Carylon Perches 06/28/2016, 10:02 AM   I spent a total of    15 Minutes in face to face in clinical consultation, greater than 50% of which was counseling/coordinating care for the abscess drain.

## 2016-07-01 NOTE — Progress Notes (Signed)
Unable to reach patient.

## 2016-07-03 ENCOUNTER — Ambulatory Visit: Payer: Medicare Other | Admitting: Cardiovascular Disease

## 2016-07-04 ENCOUNTER — Other Ambulatory Visit: Payer: Self-pay | Admitting: General Surgery

## 2016-07-04 ENCOUNTER — Encounter (HOSPITAL_COMMUNITY)
Admission: RE | Admit: 2016-07-04 | Discharge: 2016-07-04 | Disposition: A | Discharge status: Home / Self Care | Payer: Medicare Other | Source: Ambulatory Visit | Attending: Diagnostic Radiology | Admitting: Diagnostic Radiology

## 2016-07-04 DIAGNOSIS — L02211 Cutaneous abscess of abdominal wall: Secondary | ICD-10-CM

## 2016-07-05 DIAGNOSIS — L02211 Cutaneous abscess of abdominal wall: Secondary | ICD-10-CM | POA: Diagnosis not present

## 2016-07-05 DIAGNOSIS — I10 Essential (primary) hypertension: Secondary | ICD-10-CM | POA: Diagnosis not present

## 2016-07-05 DIAGNOSIS — I700 Atherosclerosis of aorta: Secondary | ICD-10-CM | POA: Diagnosis not present

## 2016-07-05 DIAGNOSIS — Z438 Encounter for attention to other artificial openings: Secondary | ICD-10-CM | POA: Diagnosis not present

## 2016-07-05 DIAGNOSIS — J45909 Unspecified asthma, uncomplicated: Secondary | ICD-10-CM | POA: Diagnosis not present

## 2016-07-05 DIAGNOSIS — Z452 Encounter for adjustment and management of vascular access device: Secondary | ICD-10-CM | POA: Diagnosis not present

## 2016-07-06 ENCOUNTER — Telehealth: Payer: Self-pay

## 2016-07-06 NOTE — Telephone Encounter (Signed)
[name] from [facility] needs to speak with a nurse regarding pt.

## 2016-07-06 NOTE — Telephone Encounter (Signed)
Called pt after speaking to [facility], lm for rtc, pt portacath still in with no recent flushes

## 2016-07-09 NOTE — Telephone Encounter (Signed)
Called pt today, she refuses an appt this week, she states she is too busy this week and she does not have transportation always, states she will keep her 11/27 appt. She is advised if she has any swelling, redness, tenderness, drainage at site to please go to ED asap not to wait, she is agreeable and offered once again an appt today or tomorrow but continues to refuse

## 2016-07-09 NOTE — Telephone Encounter (Signed)
Agree with offering an appointment this week and discussing the importance of this.  Since patient refused an appointment this week we will see her next week as already scheduled.  She knows to go to the ED if there are any concerning signs as outlined.

## 2016-07-10 ENCOUNTER — Encounter: Payer: Self-pay | Admitting: Diagnostic Radiology

## 2016-07-10 ENCOUNTER — Telehealth: Payer: Self-pay | Admitting: Cardiovascular Disease

## 2016-07-10 DIAGNOSIS — Z452 Encounter for adjustment and management of vascular access device: Secondary | ICD-10-CM | POA: Diagnosis not present

## 2016-07-10 DIAGNOSIS — L02211 Cutaneous abscess of abdominal wall: Secondary | ICD-10-CM | POA: Diagnosis not present

## 2016-07-10 DIAGNOSIS — J45909 Unspecified asthma, uncomplicated: Secondary | ICD-10-CM | POA: Diagnosis not present

## 2016-07-10 DIAGNOSIS — I10 Essential (primary) hypertension: Secondary | ICD-10-CM | POA: Diagnosis not present

## 2016-07-10 DIAGNOSIS — Z438 Encounter for attention to other artificial openings: Secondary | ICD-10-CM | POA: Diagnosis not present

## 2016-07-10 DIAGNOSIS — I700 Atherosclerosis of aorta: Secondary | ICD-10-CM | POA: Diagnosis not present

## 2016-07-10 NOTE — Telephone Encounter (Signed)
New Message  Reeves Forth from [facility] call requesting to speak with RN about pt. Ms Baxter Flattery states pt has been keeping a log of her palpitations. Pt is not currently having palpitations but states over the last three to four days pat has been having palpitations. Ms. Baxter Flattery states they last anywhere from 10 minutes to 4 hours. Heart rate is 140/150. Please call back to discuss

## 2016-07-10 NOTE — Telephone Encounter (Signed)
LMTCB-pt cell #  Spoke with Reeves Forth from [facility] nurse she states that pt is not currently have palpitations or chest pressure, but she let her know that she has had a few episodes (3) OF Palpitations w/chest pressure.  After these episodes she is very fatigued.  06-28-16 6-10pm-4 hours in duration Bp145/90 HR 161 1030pm 118/90 HR 74  07-03-16 6-7pm, 108/81 HR 150 7pm 12966 HR 72 duration 1 hour  07-08-16 535-545pm 138/95 HR 154 lasted 10-15 minutes. 545pm 110/83 HR 153.   Verified pt is taking all medications on medication list except not taking HCTZ or potassium.  Pt is going to go have her blood work done tomorrow and has follow up appointment with Dr Oval Linsey 08-01-16 '@930am'$ , do we want to follow up with PA before scheduled appt with Dr Oval Linsey?  Spoke with Dr Susette Racer apppt  Called and scheduled appt with [facility] tomorrow.

## 2016-07-11 ENCOUNTER — Emergency Department (HOSPITAL_COMMUNITY): Payer: Medicare Other

## 2016-07-11 ENCOUNTER — Ambulatory Visit: Payer: Medicare Other | Admitting: Physician Assistant

## 2016-07-11 ENCOUNTER — Observation Stay (HOSPITAL_COMMUNITY)
Admission: EM | Admit: 2016-07-11 | Discharge: 2016-07-12 | Disposition: A | Discharge status: Home / Self Care | Payer: Medicare Other | Attending: Internal Medicine | Admitting: Internal Medicine

## 2016-07-11 ENCOUNTER — Encounter (HOSPITAL_COMMUNITY): Payer: Self-pay | Admitting: *Deleted

## 2016-07-11 ENCOUNTER — Encounter: Payer: Self-pay | Admitting: Physician Assistant

## 2016-07-11 DIAGNOSIS — Z880 Allergy status to penicillin: Secondary | ICD-10-CM | POA: Diagnosis not present

## 2016-07-11 DIAGNOSIS — Z823 Family history of stroke: Secondary | ICD-10-CM | POA: Insufficient documentation

## 2016-07-11 DIAGNOSIS — K219 Gastro-esophageal reflux disease without esophagitis: Secondary | ICD-10-CM | POA: Diagnosis not present

## 2016-07-11 DIAGNOSIS — R079 Chest pain, unspecified: Secondary | ICD-10-CM | POA: Diagnosis not present

## 2016-07-11 DIAGNOSIS — I480 Paroxysmal atrial fibrillation: Principal | ICD-10-CM | POA: Insufficient documentation

## 2016-07-11 DIAGNOSIS — I10 Essential (primary) hypertension: Secondary | ICD-10-CM | POA: Diagnosis not present

## 2016-07-11 DIAGNOSIS — L02211 Cutaneous abscess of abdominal wall: Secondary | ICD-10-CM | POA: Diagnosis not present

## 2016-07-11 DIAGNOSIS — Z8541 Personal history of malignant neoplasm of cervix uteri: Secondary | ICD-10-CM | POA: Insufficient documentation

## 2016-07-11 DIAGNOSIS — E785 Hyperlipidemia, unspecified: Secondary | ICD-10-CM | POA: Diagnosis not present

## 2016-07-11 DIAGNOSIS — Z7901 Long term (current) use of anticoagulants: Secondary | ICD-10-CM | POA: Diagnosis not present

## 2016-07-11 DIAGNOSIS — E876 Hypokalemia: Secondary | ICD-10-CM | POA: Insufficient documentation

## 2016-07-11 DIAGNOSIS — I4891 Unspecified atrial fibrillation: Secondary | ICD-10-CM | POA: Diagnosis present

## 2016-07-11 DIAGNOSIS — Z8249 Family history of ischemic heart disease and other diseases of the circulatory system: Secondary | ICD-10-CM | POA: Diagnosis not present

## 2016-07-11 DIAGNOSIS — Z87891 Personal history of nicotine dependence: Secondary | ICD-10-CM | POA: Insufficient documentation

## 2016-07-11 DIAGNOSIS — I4892 Unspecified atrial flutter: Secondary | ICD-10-CM | POA: Diagnosis not present

## 2016-07-11 DIAGNOSIS — Z85038 Personal history of other malignant neoplasm of large intestine: Secondary | ICD-10-CM | POA: Diagnosis not present

## 2016-07-11 DIAGNOSIS — Z79899 Other long term (current) drug therapy: Secondary | ICD-10-CM | POA: Insufficient documentation

## 2016-07-11 DIAGNOSIS — Z9884 Bariatric surgery status: Secondary | ICD-10-CM | POA: Insufficient documentation

## 2016-07-11 DIAGNOSIS — Z7951 Long term (current) use of inhaled steroids: Secondary | ICD-10-CM | POA: Insufficient documentation

## 2016-07-11 DIAGNOSIS — J45909 Unspecified asthma, uncomplicated: Secondary | ICD-10-CM | POA: Insufficient documentation

## 2016-07-11 DIAGNOSIS — R000 Tachycardia, unspecified: Secondary | ICD-10-CM | POA: Diagnosis not present

## 2016-07-11 LAB — URINALYSIS, ROUTINE W REFLEX MICROSCOPIC
BILIRUBIN URINE: NEGATIVE
Glucose, UA: NEGATIVE mg/dL
HGB URINE DIPSTICK: NEGATIVE
KETONES UR: NEGATIVE mg/dL
Leukocytes, UA: NEGATIVE
NITRITE: NEGATIVE
Protein, ur: NEGATIVE mg/dL
SPECIFIC GRAVITY, URINE: 1.006 (ref 1.005–1.030)
pH: 6.5 (ref 5.0–8.0)

## 2016-07-11 LAB — CBC
HCT: 36.9 % (ref 36.0–46.0)
Hemoglobin: 11.1 g/dL — ABNORMAL LOW (ref 12.0–15.0)
MCH: 21.6 pg — ABNORMAL LOW (ref 26.0–34.0)
MCHC: 30.1 g/dL (ref 30.0–36.0)
MCV: 71.7 fL — ABNORMAL LOW (ref 78.0–100.0)
PLATELETS: 319 10*3/uL (ref 150–400)
RBC: 5.15 MIL/uL — ABNORMAL HIGH (ref 3.87–5.11)
RDW: 18.1 % — AB (ref 11.5–15.5)
WBC: 8.2 10*3/uL (ref 4.0–10.5)

## 2016-07-11 LAB — BASIC METABOLIC PANEL
Anion gap: 12 (ref 5–15)
BUN: 10 mg/dL (ref 6–20)
CALCIUM: 9.1 mg/dL (ref 8.9–10.3)
CO2: 21 mmol/L — ABNORMAL LOW (ref 22–32)
CREATININE: 0.89 mg/dL (ref 0.44–1.00)
Chloride: 108 mmol/L (ref 101–111)
GFR calc Af Amer: >60 mL/min (ref >60)
GLUCOSE: 116 mg/dL — AB (ref 65–99)
POTASSIUM: 3.3 mmol/L — AB (ref 3.5–5.1)
SODIUM: 141 mmol/L (ref 135–145)

## 2016-07-11 LAB — TROPONIN I

## 2016-07-11 MED ORDER — SODIUM CHLORIDE 0.9 % IV BOLUS (SEPSIS)
500.0000 mL | Freq: Once | INTRAVENOUS | Status: AC
  Administered 2016-07-11: 500 mL via INTRAVENOUS

## 2016-07-11 MED ORDER — ADENOSINE 6 MG/2ML IV SOLN
6.0000 mg | Freq: Once | INTRAVENOUS | Status: DC
  Filled 2016-07-11: qty 2

## 2016-07-11 MED ORDER — DILTIAZEM LOAD VIA INFUSION
10.0000 mg | Freq: Once | INTRAVENOUS | Status: AC
  Administered 2016-07-12: 10 mg via INTRAVENOUS
  Filled 2016-07-11: qty 10

## 2016-07-11 MED ORDER — DEXTROSE 5 % IV SOLN
5.0000 mg/h | INTRAVENOUS | Status: DC
  Administered 2016-07-12: 5 mg/h via INTRAVENOUS
  Filled 2016-07-11: qty 100

## 2016-07-11 MED ORDER — DILTIAZEM HCL 25 MG/5ML IV SOLN
10.0000 mg | Freq: Once | INTRAVENOUS | Status: AC
  Administered 2016-07-11: 10 mg via INTRAVENOUS

## 2016-07-11 NOTE — H&P (Signed)
Date: 07/11/2016               Patient Name:  Suzanne Macdonald MRN: [id number]  DOB: [birth year] Age / Sex: 65 y.o., female   PCP: Bartholomew Crews, MD         Medical Service: Internal Medicine Teaching Service         Attending Physician: Dr. Sid Falcon, MD    First Contact: Dr. Alphonzo Grieve, MD Pager: [phone]  Second Contact: Dr. Burgess Estelle, MD Pager: [phone]       After Hours (After 5p/  First Contact Pager: [phone]  weekends / holidays): Second Contact Pager: [phone]   Chief Complaint: chest pain, palpitations  History of Present Illness:  This is a 65 y/o F with MHx significant for PAF, HTN, current intraabdominal abscess on abx + drain, morbid obesity and hx of cervical and colorectal cancer s/p surgical tx who presents for evaluation of chest pain and palpitations. These symptoms began suddenly and have persisted since onset. Chest pain is described as dull pressure and palpitations feels like her heart is pounding out of her chest. Reports this has happened before when her AFIB has kicked in however has not lasted this long. Reports compliance with her Eliquis daily since being d/c from hospital 10/26 after drain placement (intrabdominal abscess). Reports she has been taking Carvedilol and not cardizem (name confusion) which was prescribed for rate control for her atrial fibrillation. She denies any fevers, chills, SOB, cough, diaphoresis, nausea, vomiting, constipation or dysuria. Of note, the patient is prescribed Augmentin BID however has only been taking once daily for several weeks as she developed significant diarrhea on BID dosing. Since that time she has noticed her drain has been draining foul smelling discharge.   In the EDD VSS showed temp of 98.6, pulse 155, BP 160/125 and respirations of 20 and was saturating well on room air. EKG obtained in ED showed Afib with RVR with a rate of 160 however repeat EKG showed NSR. Throughout her time in the ED she would  cycle in and out of atrial fibrillation. She was given a bolus of IV diltiazem '10mg'$  without significant improvement. Cardiology was consulted in ED who noted the patient was in atrial flutter with 2:1 conduction who started IV Cardizem drip for rate control.    Meds:  Current Meds  Medication Sig  . acetaminophen (TYLENOL) 500 MG tablet Take 1,000 mg by mouth every 6 (six) hours as needed for mild pain or moderate pain.  Marland Kitchen albuterol (PROVENTIL HFA;VENTOLIN HFA) 108 (90 Base) MCG/ACT inhaler Inhale 1-2 puffs into the lungs every 6 (six) hours as needed for wheezing or shortness of breath.  Marland Kitchen amoxicillin-clavulanate (AUGMENTIN) 875-125 MG tablet Take 1 tablet by mouth 2 (two) times daily.  Marland Kitchen apixaban (ELIQUIS) 5 MG TABS tablet Take 1 tablet (5 mg total) by mouth 2 (two) times daily.  Marland Kitchen atorvastatin (LIPITOR) 10 MG tablet take 1 tablet by mouth once daily  . budesonide-formoterol (SYMBICORT) 160-4.5 MCG/ACT inhaler Inhale 2 puffs into the lungs 2 (two) times daily.  . Calcium-Phosphorus-Vitamin D (CALCIUM/VITAMIN D3/ADULT GUMMY PO) Take 2 each by mouth daily.  . carvedilol (COREG) 6.25 MG tablet Take 9.375 mg by mouth 2 (two) times daily with a meal.  . Cholecalciferol (VITAMIN D3) 2000 units CHEW Chew 2,000 Units by mouth 2 (two) times daily.  . cyanocobalamin 500 MCG tablet Take 1,000 mcg by mouth daily.   Marland Kitchen docusate sodium (COLACE) 100 MG capsule Take  1 capsule (100 mg total) by mouth 2 (two) times daily as needed for mild constipation.  . flecainide (TAMBOCOR) 100 MG tablet Take 1 tablet (100 mg total) by mouth 2 (two) times daily. (Patient taking differently: Take 50 mg by mouth 2 (two) times daily. )  . lisinopril (PRINIVIL,ZESTRIL) 5 MG tablet Take 1 tablet (5 mg total) by mouth daily.  Marland Kitchen loperamide (IMODIUM) 2 MG capsule Take 2 mg by mouth daily as needed for diarrhea or loose stools.  . Multiple Vitamins-Minerals (ALIVE WOMENS GUMMY) CHEW Chew 2 each by mouth daily.  Marland Kitchen omeprazole  (PRILOSEC) 40 MG capsule Take 1 capsule (40 mg total) by mouth daily.  . potassium chloride (K-DUR) 10 MEQ tablet 2 TABLETS BY MOUTH DAILY  . prochlorperazine (COMPAZINE) 10 MG tablet Take 10 mg by mouth every 6 (six) hours as needed for nausea or vomiting.  . traZODone (DESYREL) 100 MG tablet Take 100 mg by mouth at bedtime.    Allergies: Allergies as of 07/11/2016 - Review Complete 07/11/2016  Allergen Reaction Noted  . Augmentin [amoxicillin-pot clavulanate] Diarrhea 07/11/2016  . Scallops [shellfish allergy] Hives 06/09/2016   Past Medical History:  Diagnosis Date  . Asthma   . Atrial fibrillation ([place])   . Cervical cancer ([place]) 1972  . Colon cancer ([place])   . H/O gastric bypass   . Hyperlipidemia   . Hypertension   . Morbid obesity ([place]) 03/04/2016  . Sleep apnea     Family History: CAD, CVA, DM, thyroid disease  Social History: 20 pack-year smoking history. Occasional alcohol use. Denied any drug use.   Review of Systems: A complete ROS was negative except as per HPI.   Physical Exam: Blood pressure 135/86, pulse 76, temperature 98.6 F (37 C), resp. rate 19, height '5\' 2"'$  (1.575 m), weight 283 lb (128.4 kg), SpO2 99 %. General: Obese caucasian woman resting comfortably in bed. In no acute distress. Pleasant and cooperative.  HENT: Adentate. Oropharynx clear. Mucous membranes moist. EOMI Cardiovascular: Regular rate and rhythm. No murmurs or rubs. No chest wall tenderness.  Pulmonary: CTA BL, no adventitious sounds appreciated Abdomen: Soft, non-tender obese abdomen. Drain present on right mid abdomen. Covered in clean dry bandage. Drain contains ~20 cc purulent appearing fluid. Extremities: No peripheral edema appreciated in extremities. Intact distal pulses.  Psych: Mood normal, affect mood congruent. Goal-directed speech and responded to all questions appropriately.   EKG: Atrial Fibrillation with RVR, rate 160. Rate-related ST depression. Follow-up EKG NSR.   CXR:  Port-A-Cath present in right chest wall. No acute cardiopulmonary abnormalities.  CMP Latest Ref Rng & Units 07/11/2016 06/12/2016 06/11/2016  Glucose 65 - 99 mg/dL 116(H) 110(H) 138(H)  BUN 6 - 20 mg/dL '10 11 13  '$ Creatinine 0.44 - 1.00 mg/dL 0.89 0.82 0.90  Sodium 135 - 145 mmol/L 141 138 134(L)  Potassium 3.5 - 5.1 mmol/L 3.3(L) 3.5 3.1(L)  Chloride 101 - 111 mmol/L 108 106 99(L)  CO2 22 - 32 mmol/L 21(L) 26 26  Calcium 8.9 - 10.3 mg/dL 9.1 7.7(L) 7.9(L)  Total Protein 6.5 - 8.1 g/dL - - -  Total Bilirubin 0.3 - 1.2 mg/dL - - -  Alkaline Phos 38 - 126 U/L - - -  AST 15 - 41 U/L - - -  ALT 14 - 54 U/L - - -   CBC Latest Ref Rng & Units 07/11/2016 06/14/2016 06/13/2016  WBC 4.0 - 10.5 K/uL 8.2 8.6 6.3  Hemoglobin 12.0 - 15.0 g/dL 11.1(L) 8.9(L) 8.2(L)  Hematocrit  36.0 - 46.0 % 36.9 30.0(L) 27.3(L)  Platelets 150 - 400 K/uL 319 326 273   Urinalysis    Component Value Date/Time   COLORURINE YELLOW 07/11/2016 2132   APPEARANCEUR CLEAR 07/11/2016 2132   LABSPEC 1.006 07/11/2016 2132   PHURINE 6.5 07/11/2016 2132   GLUCOSEU NEGATIVE 07/11/2016 2132   HGBUR NEGATIVE 07/11/2016 2132   BILIRUBINUR NEGATIVE 07/11/2016 2132   [place] NEGATIVE 07/11/2016 2132   PROTEINUR NEGATIVE 07/11/2016 2132   NITRITE NEGATIVE 07/11/2016 2132   LEUKOCYTESUR NEGATIVE 07/11/2016 2132   Assessment & Plan by Problem: Active Problems:   Atrial fibrillation with RVR (HCC)  Paroxysmal Atrial Fibrillation with RVR In and out of atrial fibrillation during examination, patient felt chest pressure when AFib began. Pt seen by cardiology in ED who noted she was in AFlutter with 2:1 conduction. Started on dilt drip to hopefully convert back to NSR. If she does not, could consider cardioversion. It appears that the patient has been on her Eliquis since d/c after drain placement 10/26. Reports compliance with Flecainide and carvedilol however did not start Diltiazem per cards note.  -IV Dilt drip,  '5mg'$ /hr -Continue flecainide 100 mg BID, Carvedilol 9.375 mg BID  Abdominal Wall Abscess Hx of large incarcerated ventral hernia repair 04/2016 with subsequent development of a large intraabdominal abscess. IR placed drain. Cultures grew pansensitive E.coli +yeast. Received Ceftriaxone + Fluconazole and was d/c home with a 4 week course of these IV abx, stop date 11/17. She was d/c IV abx and transitioned to PO Augmentin BID 06/20/2016 and has apt to f/u with ID 11/27 with Dr. Johnnye Sima. The patient reports she has only been taking 1 Augmentin a day because taking 2 gave her significant diarrhea. Since that time, she has noticed worsening foul drainage.  -Continue Augmentin BID, however could consider changing abx -Pt to keep f/u apt with ID  HTN Hx of HTN and recently started on Lisinopril '5mg'$ /d/c HCTZ during her most recent admission. Will continue to monitor and adjust antihypertensive if she remains elevated during hospitalization -Continue Lisinopril '5mg'$  -Monitor pressures  Hypokalemia Hx of mild hypokalemia during recent admissions. -Currently 3.3, will replete with 20 mEq K-dur.  -f/u AM BMET  HLD -Atorvastatin '10mg'$  daily continued  GERD Controlled. -Continue omeprazole '40mg'$  daily  Asthma Controlled. -Continue Proventil and Dulera  Dispo: Admit patient to Observation with expected length of stay less than 2 midnights.  SignedEinar Gip, DO 07/11/2016, 11:50 PM  Pager: [phone]

## 2016-07-11 NOTE — Progress Notes (Deleted)
Cardiology Office Note   Date:  07/11/2016   ID:  [name], DOB 26-Dec-1950, MRN [id number]  PCP:  [name], MD  Cardiologist:  Dr Oliver Barre, PA-C   No chief complaint on file.   History of Present Illness: Suzanne Macdonald is a 65 y.o. female with a history of colon CA, 2014 dx PAF on Flecainide and Eliquis, HTN, HLD, morbid obesity s/p gastric bypass, OSA, cath 2015 [facility] w/ no sig dz  Pt having palpitations w/ HR up to 160 per pt logs, appt made.  Suzanne Macdonald presents for ***   Past Medical History:  Diagnosis Date  . Asthma   . Atrial fibrillation ([place])   . Cervical cancer ([place]) 1972  . Colon cancer ([place])   . H/O gastric bypass   . Hyperlipidemia   . Hypertension   . Morbid obesity ([place]) 03/04/2016  . Sleep apnea     Past Surgical History:  Procedure Laterality Date  . ABDOMINAL HYSTERECTOMY  1998   Salpingoophorectomy. For tumorous growth.  . BOWEL RESECTION  2015   [place], [place] by Dr. Stormy Fabian  . CARDIAC CATHETERIZATION  05/2014   in [place], no sig CAD (done prior to surgery for colon CA)  . GASTRIC BYPASS  1992  . INSERTION OF MESH N/A 05/01/2016   Procedure: INSERTION OF MESH;  Surgeon: Excell Seltzer, MD;  Location: [facility];  Service: General;  Laterality: N/A;  . IR GENERIC HISTORICAL  06/28/2016   IR RADIOLOGIST EVAL & MGMT 06/28/2016 Markus Daft, MD [facility] INTERV RAD  . LAPAROSCOPIC GASTRIC BANDING     Placed 2013 and removed in 2014.  Marland Kitchen VENTRAL HERNIA REPAIR N/A 05/01/2016   Procedure: VENTRAL HERNIA REPAIR;  Surgeon: Excell Seltzer, MD;  Location: [facility] OR;  Service: General;  Laterality: N/A;    Current Outpatient Prescriptions  Medication Sig Dispense Refill  . acetaminophen (TYLENOL) 500 MG tablet Take 1,000 mg by mouth every 6 (six) hours as needed for mild pain or moderate pain.    Marland Kitchen acetaminophen-codeine (TYLENOL #3) 300-30 MG tablet Take 1-2 tablets by mouth every 4 (four) hours as needed for moderate pain  or severe pain. 60 tablet 0  . albuterol (PROVENTIL HFA;VENTOLIN HFA) 108 (90 Base) MCG/ACT inhaler Inhale 1-2 puffs into the lungs every 6 (six) hours as needed for wheezing or shortness of breath.    Marland Kitchen amoxicillin-clavulanate (AUGMENTIN) 875-125 MG tablet Take 1 tablet by mouth 2 (two) times daily. 60 tablet 0  . apixaban (ELIQUIS) 5 MG TABS tablet Take 1 tablet (5 mg total) by mouth 2 (two) times daily. 180 tablet 1  . atorvastatin (LIPITOR) 10 MG tablet take 1 tablet by mouth once daily 90 tablet 1  . budesonide-formoterol (SYMBICORT) 160-4.5 MCG/ACT inhaler Inhale 2 puffs into the lungs 2 (two) times daily. 1 Inhaler 1  . Calcium-Phosphorus-Vitamin D (CALCIUM/VITAMIN D3/ADULT GUMMY PO) Take 2 each by mouth daily.    . carvedilol (COREG) 3.125 MG tablet Take 3 tablets (9.375 mg total) by mouth 2 (two) times daily with a meal. 60 tablet 0  . cholecalciferol (VITAMIN D) 1000 units tablet Take 2,000 Units by mouth daily.     . cyanocobalamin 500 MCG tablet Take 1,000 mcg by mouth daily.     Marland Kitchen docusate sodium (COLACE) 100 MG capsule Take 1 capsule (100 mg total) by mouth 2 (two) times daily as needed for mild constipation. 10 capsule 0  . flecainide (TAMBOCOR) 100 MG tablet Take 1 tablet (100 mg  total) by mouth 2 (two) times daily. 60 tablet 0  . hydrochlorothiazide (HYDRODIURIL) 12.5 MG tablet 1/2 TABLET BY MOUTH DAILY 15 tablet 5  . lisinopril (PRINIVIL,ZESTRIL) 5 MG tablet Take 1 tablet (5 mg total) by mouth daily. 30 tablet 0  . Multiple Vitamins-Minerals (ALIVE WOMENS GUMMY) CHEW Chew 2 each by mouth daily.    Marland Kitchen omeprazole (PRILOSEC) 40 MG capsule Take 1 capsule (40 mg total) by mouth daily. 90 capsule 1  . ondansetron (ZOFRAN-ODT) 8 MG disintegrating tablet Take 1 tablet (8 mg total) by mouth every 8 (eight) hours as needed for nausea or vomiting. 10 tablet 0  . polyethylene glycol (MIRALAX / GLYCOLAX) packet Take 17 g by mouth daily. 14 each 0  . potassium chloride (K-DUR) 10 MEQ tablet 2  TABLETS BY MOUTH DAILY 60 tablet 5  . senna-docusate (SENOKOT-S) 8.6-50 MG tablet Take 1 tablet by mouth at bedtime as needed for mild constipation. 30 tablet 0  . traZODone (DESYREL) 100 MG tablet Take 100 mg by mouth at bedtime.  0   No current facility-administered medications for this visit.     Allergies:   Scallops [shellfish allergy]    Social History:  The patient  reports that she has quit smoking. Her smoking use included Cigarettes. She quit after 0.20 years of use. She has never used smokeless tobacco. She reports that she does not drink alcohol or use drugs.   Family History:  The patient's family history includes Diabetes in her father, mother, sister, and sister; Drug abuse in her brother; Early death in her brother; Heart attack in her brother and brother; Heart disease in her father and mother; Stroke in her brother and father; Thyroid disease in her daughter, sister, and sister.    ROS:  Please see the history of present illness. All other systems are reviewed and negative.    PHYSICAL EXAM: VS:  There were no vitals taken for this visit. , BMI There is no height or weight on file to calculate BMI. GEN: Well nourished, well developed, female in no acute distress  HEENT: normal for age  Neck: no JVD, no carotid bruit, no masses Cardiac: RRR; no murmur, no rubs, or gallops Respiratory:  clear to auscultation bilaterally, normal work of breathing GI: soft, nontender, nondistended, + BS MS: no deformity or atrophy; no edema; distal pulses are 2+ in all 4 extremities   Skin: warm and dry, no rash Neuro:  Strength and sensation are intact Psych: euthymic mood, full affect   EKG:  EKG {ACTION; IS/IS QIH:[id number]} ordered today. The ekg ordered today demonstrates ***   Recent Labs: 03/02/2016: B Natriuretic Peptide 98.9; TSH 1.964 06/09/2016: ALT 11 06/11/2016: Magnesium 1.7 06/12/2016: BUN 11; Creatinine, Ser 0.82; Potassium 3.5; Sodium 138 06/14/2016: Hemoglobin  8.9; Platelets 326    Lipid Panel    Component Value Date/Time   CHOL 227 (H) 01/24/2016 0952   TRIG 131 01/24/2016 0952   HDL 50 01/24/2016 0952   CHOLHDL 4.5 (H) 01/24/2016 0952   LDLCALC 151 (H) 01/24/2016 0952     Wt Readings from Last 3 Encounters:  06/27/16 283 lb 6.4 oz (128.5 kg)  06/20/16 284 lb 3.2 oz (128.9 kg)  06/10/16 285 lb 4.8 oz (129.4 kg)     Other studies Reviewed: Additional studies/ records that were reviewed today include: ***.  ASSESSMENT AND PLAN:  1.  ***   Current medicines are reviewed at length with the patient today.  The patient {ACTIONS; HAS/DOES NOT HAVE:19233} concerns  regarding medicines.  The following changes have been made:  {PLAN; NO CHANGE:13088:s}  Labs/ tests ordered today include: *** No orders of the defined types were placed in this encounter.    Disposition:   FU with ***  Signed, Rosaria Ferries, PA-C  07/11/2016 8:01 AM    [facility] Phone: [phone]; Fax: [phone]  This note was written with the assistance of speech recognition software. Please excuse any transcriptional errors.

## 2016-07-11 NOTE — ED Provider Notes (Signed)
Emergency Department Provider Note   I have reviewed the triage vital signs and the nursing notes.   HISTORY  Chief Complaint Chest Pain   HPI Suzanne Macdonald is a 65 y.o. female with PMH of a-fib, HLD, and HTN presents to the emergency department for evaluation of acute on onset chest pain or heart palpitations at 8 PM this evening. The patient has Suzanne Macdonald history of age for fibrillation is on Eliquis. She states it feels very similar to episodes that she's had of A. Fib. She has some mild dyspnea. She's been compliant with her home medications including flecainide and carvedilol. Patient is currently on antibiotics with a JP drain in place from intra-abdominal abscess. She denies any fevers, shaking chills, diarrhea, or vomiting. She's been eating and drinking well at home.   Past Medical History:  Diagnosis Date  . Asthma   . Atrial fibrillation ([place])   . Cervical cancer ([place]) 1972  . Colon cancer ([place])   . H/O gastric bypass   . Hyperlipidemia   . Hypertension   . Morbid obesity ([place]) 03/04/2016  . Sleep apnea     Patient Active Problem List   Diagnosis Date Noted  . Atrial fibrillation with RVR ([place]) 07/11/2016  . Fever   . At risk for surgical site infection   . Gram-negative infection   . Abdominal wall abscess 06/10/2016  . Aortic atherosclerosis ([place]) 05/09/2016  . Morbid obesity ([place]) 05/04/2016  . Incarcerated hernia 04/28/2016  . Osteopenia 03/15/2016  . Atrial fibrillation with tachycardic ventricular rate ([place])   . Asthma 03/02/2016  . Prediabetes 01/25/2016  . Essential hypertension 01/25/2016  . Vitamin D deficiency 01/25/2016  . 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 Vitamin B12 deficiency 01/25/2016  . Paroxysmal atrial fibrillation ([place]) 01/25/2016  . Health care maintenance 01/25/2016  . History of colon cancer 01/25/2016  . Ventral hernia 01/25/2016  . History of cervical cancer 01/25/2016    Past Surgical History:  Procedure Laterality Date    . ABDOMINAL HYSTERECTOMY  1998   Salpingoophorectomy. For tumorous growth.  . BOWEL RESECTION  2015   [place], [place] by Dr. Stormy Fabian  . CARDIAC CATHETERIZATION  05/2014   in [place], no sig CAD (done prior to surgery for colon CA)  . GASTRIC BYPASS  1992  . INSERTION OF MESH N/A 05/01/2016   Procedure: INSERTION OF MESH;  Surgeon: Excell Seltzer, MD;  Location: [facility];  Service: General;  Laterality: N/A;  . IR GENERIC HISTORICAL  06/28/2016   IR RADIOLOGIST EVAL & MGMT 06/28/2016 Markus Daft, MD [facility] INTERV RAD  . LAPAROSCOPIC GASTRIC BANDING     Placed 2013 and removed in 2014.  Marland Kitchen VENTRAL HERNIA REPAIR N/A 05/01/2016   Procedure: VENTRAL HERNIA REPAIR;  Surgeon: Excell Seltzer, MD;  Location: [facility];  Service: General;  Laterality: N/A;    Current Outpatient Rx  . Order #: [id number] Class: Historical Med  . Order #: [id number] Class: Historical Med  . Order #: [id number] Class: Normal  . Order #: [id number] Class: Normal  . Order #: [id number] Class: Normal  . Order #: [id number] Class: Normal  . Order #: [id number] Class: Historical Med  . Order #: [id number] Class: Historical Med  . Order #: [id number] Class: Historical Med  . Order #: [id number] Class: Historical Med  . Order #: [id number] Class: Normal  . Order #: [id number] Class: Normal  . Order #: [id number] Class: Normal  . Order #: [id number] Class: Historical Med  . Order #: [id number] Class: Historical Med  . Order #: [id number] Class: Normal  . Order #:  [id number] Class: Normal  . Order #: [id number] Class: Historical Med  . Order #: [id number] Class: Historical Med    Allergies Augmentin [amoxicillin-pot clavulanate] and Scallops [shellfish allergy]  Family History  Problem Relation Age of Onset  . Heart disease Mother   . Diabetes Mother   . Heart disease Father   . Stroke Father   . Diabetes Father   . Diabetes Sister   . Thyroid disease Sister   . Early death Brother     Killed by drunk driver  . Heart attack Brother   .  Thyroid disease Daughter     s/p thyroidectomy  . Diabetes Sister   . Thyroid disease Sister   . Stroke Brother   . Drug abuse Brother   . Heart attack Brother     Social History Social History  Substance Use Topics  . Smoking status: Former Smoker    Years: 0.20    Types: Cigarettes  . Smokeless tobacco: Never Used  . Alcohol use No     Comment: a beer every now and then    Review of Systems  Constitutional: No fever/chills Eyes: No visual changes. ENT: No sore throat. Cardiovascular: Positive chest pain and heart palpitations.  Respiratory: Denies shortness of breath. Gastrointestinal: No abdominal pain.  No nausea, no vomiting.  No diarrhea.  No constipation. Genitourinary: Negative for dysuria. Musculoskeletal: Negative for back pain. Skin: Negative for rash. Neurological: Negative for headaches, focal weakness or numbness.  10-point ROS otherwise negative.  ____________________________________________   PHYSICAL EXAM:  VITAL SIGNS: ED Triage Vitals  Enc Vitals Group     BP 07/11/16 2120 (!) 160/125     Pulse Rate 07/11/16 2120 (!) 155     Resp 07/11/16 2120 20     Temp 07/11/16 2120 98.6 F (37 C)     SpO2 07/11/16 2120 98 %     Weight 07/11/16 2118 283 lb (128.4 kg)     Height 07/11/16 2118 '5\' 2"'$  (1.575 m)     Pain Score 07/11/16 2118 5   Constitutional: Alert and oriented. Appears uncomfortable.  Eyes: Conjunctivae are normal.  Head: Atraumatic. Nose: No congestion/rhinnorhea. Mouth/Throat: Mucous membranes are moist.  Oropharynx non-erythematous. Neck: No stridor. Cardiovascular: a-fib with RVR on monitor. Good peripheral circulation. Grossly normal heart sounds.   Respiratory: Normal respiratory effort.  No retractions. Lungs CTAB. Gastrointestinal: Soft and nontender. No distention.  Musculoskeletal: No lower extremity tenderness nor edema. No gross deformities of extremities. Neurologic:  Normal speech and language. No gross focal neurologic  deficits are appreciated.  Skin:  Skin is warm, dry and intact. No rash noted.   ____________________________________________   LABS (all labs ordered are listed, but only abnormal results are displayed)  Labs Reviewed  BASIC METABOLIC PANEL - Abnormal; Notable for the following:       Result Value   Potassium 3.3 (*)    CO2 21 (*)    Glucose, Bld 116 (*)    All other components within normal limits  CBC - Abnormal; Notable for the following:    RBC 5.15 (*)    Hemoglobin 11.1 (*)    MCV 71.7 (*)    MCH 21.6 (*)    RDW 18.1 (*)    All other components within normal limits  TROPONIN I  URINALYSIS, ROUTINE W REFLEX MICROSCOPIC (NOT AT [facility])   ____________________________________________  EKG   EKG Interpretation  Date/Time:  Wednesday July 11 2016 22:06:16 EST Ventricular Rate:  161 PR Interval:    QRS Duration:  76 QT Interval:  296 QTC Calculation: 485 R Axis:   69 Text Interpretation:  Atrial fibrillation with rapid V-rate ST depression, probably rate related No STEMI.  Confirmed by Rita Vialpando MD, Baillie Mohammad [phone]) on 07/11/2016 10:28:06 PM       ____________________________________________  RADIOLOGY  Dg Chest Portable 1 View  Result Date: 07/11/2016 CLINICAL DATA:  Chest pain EXAM: PORTABLE CHEST 1 VIEW COMPARISON:  Chest radiograph 06/09/2016 FINDINGS: Right chest wall Port-A-Cath tip overlies the lower SVC, unchanged. Cardiomediastinal contours are normal. No focal airspace consolidation or pulmonary edema. No pneumothorax pleural effusion. IMPRESSION: No active disease. Electronically Signed   By: Ulyses Jarred M.D.   On: 07/11/2016 22:03    ____________________________________________   PROCEDURES  Procedure(s) performed:   Procedures  CRITICAL CARE Performed by: Margette Fast Total critical care time: 35 minutes Critical care time was exclusive of separately billable procedures and treating other patients. Critical care was necessary to treat or  prevent imminent or life-threatening deterioration. Critical care was time spent personally by me on the following activities: development of treatment plan with patient and/or surrogate as well as nursing, discussions with consultants, evaluation of patient's response to treatment, examination of patient, obtaining history from patient or surrogate, ordering and performing treatments and interventions, ordering and review of laboratory studies, ordering and review of radiographic studies, pulse oximetry and re-evaluation of patient's condition.  Nanda Quinton, MD Emergency Medicine  ____________________________________________   INITIAL IMPRESSION / ASSESSMENT AND PLAN / ED COURSE  Pertinent labs & imaging results that were available during my care of the patient were reviewed by me and considered in my medical decision making (see chart for details).  Patient resents emergency department for evaluation of chest pain or heart palpitations. On EKG she has a borderline wide, regular tachycardia. Does not appear to be A. Fib. No hypotension the patient is experiencing chest pain. Plan for IV access and fluids.  10:14 PM Repeat EKG shows patient very clearly an A. fib with RVR. Significant change from initial triage EKG which showed a narrow complex regular tachycardia.   10:23 PM Gave IV diltiazem bolus 10 mg. Prior to pushing this the patient is intermittently coming in and out of sinus rhythm but then returns to A. fib with RVR. No more regular tachycardias on monitor. We will give additional IV fluids and monitored for response to diltiazem.   11:21 PM Spoke with cardiology regarding the case and possible cardioversion. The patient had recent ventral hernia repair with large fluid collection and interval placement of a JP drain. These procedures required holding the Eliquis. I confirmed this with the patient. Given missing some doses with surgical procedures not feel that cardioversion is the  safest option at this point. Plan for an control with diltiazem infusion and admission. Discussed plan with patient who is in agreement.   11:37 PM Spoke with teaching service. Plan to admit to stepdown on observation status. Initiating diltiazem bolus and drip. Discussed plan with family who are in agreement. ____________________________________________  FINAL CLINICAL IMPRESSION(S) / ED DIAGNOSES  Final diagnoses:  Atrial fibrillation with RVR ([place])     MEDICATIONS GIVEN DURING THIS VISIT:  Medications  adenosine (ADENOCARD) 6 MG/2ML injection 6 mg (not administered)  diltiazem (CARDIZEM) 1 mg/mL load via infusion 10 mg (not administered)    And  diltiazem (CARDIZEM) 100 mg in dextrose 5 % 100 mL (1 mg/mL) infusion (not administered)  sodium chloride 0.9 % bolus 500 mL (500 mLs Intravenous New Bag/Given 07/11/16 2210)  diltiazem (CARDIZEM) injection 10 mg (10 mg Intravenous Given 07/11/16 2220)  sodium chloride 0.9 % bolus 500 mL (500 mLs Intravenous New Bag/Given 07/11/16 2223)     NEW OUTPATIENT MEDICATIONS STARTED DURING THIS VISIT:  None   Note:  This document was prepared using Dragon voice recognition software and may include unintentional dictation errors.  Nanda Quinton, MD Emergency Medicine   Margette Fast, MD 07/11/16 [phone]

## 2016-07-11 NOTE — ED Triage Notes (Signed)
The pt is c/o a rapid heart rate and chest pain since 1930 tonight  No sob no nv no dizziness  Rt arm pain also   Recent surgery for a hernia repair

## 2016-07-12 ENCOUNTER — Encounter (HOSPITAL_COMMUNITY): Payer: Self-pay | Admitting: General Practice

## 2016-07-12 DIAGNOSIS — I4891 Unspecified atrial fibrillation: Secondary | ICD-10-CM

## 2016-07-12 DIAGNOSIS — I10 Essential (primary) hypertension: Secondary | ICD-10-CM | POA: Diagnosis not present

## 2016-07-12 DIAGNOSIS — L02211 Cutaneous abscess of abdominal wall: Secondary | ICD-10-CM | POA: Diagnosis not present

## 2016-07-12 DIAGNOSIS — I4892 Unspecified atrial flutter: Secondary | ICD-10-CM | POA: Diagnosis present

## 2016-07-12 DIAGNOSIS — I480 Paroxysmal atrial fibrillation: Secondary | ICD-10-CM | POA: Diagnosis not present

## 2016-07-12 DIAGNOSIS — I483 Typical atrial flutter: Secondary | ICD-10-CM

## 2016-07-12 LAB — CBC
HEMATOCRIT: 32.5 % — AB (ref 36.0–46.0)
HEMOGLOBIN: 9.7 g/dL — AB (ref 12.0–15.0)
MCH: 21.4 pg — ABNORMAL LOW (ref 26.0–34.0)
MCHC: 29.8 g/dL — ABNORMAL LOW (ref 30.0–36.0)
MCV: 71.7 fL — ABNORMAL LOW (ref 78.0–100.0)
Platelets: 298 10*3/uL (ref 150–400)
RBC: 4.53 MIL/uL (ref 3.87–5.11)
RDW: 17.9 % — AB (ref 11.5–15.5)
WBC: 7.7 10*3/uL (ref 4.0–10.5)

## 2016-07-12 LAB — COMPREHENSIVE METABOLIC PANEL
ALT: 18 U/L (ref 14–54)
ANION GAP: 7 (ref 5–15)
AST: 31 U/L (ref 15–41)
Albumin: 3.1 g/dL — ABNORMAL LOW (ref 3.5–5.0)
Alkaline Phosphatase: 60 U/L (ref 38–126)
BILIRUBIN TOTAL: 0.5 mg/dL (ref 0.3–1.2)
BUN: 10 mg/dL (ref 6–20)
CO2: 24 mmol/L (ref 22–32)
Calcium: 8.7 mg/dL — ABNORMAL LOW (ref 8.9–10.3)
Chloride: 110 mmol/L (ref 101–111)
Creatinine, Ser: 0.79 mg/dL (ref 0.44–1.00)
GFR calc Af Amer: >60 mL/min (ref >60)
Glucose, Bld: 113 mg/dL — ABNORMAL HIGH (ref 65–99)
POTASSIUM: 3.2 mmol/L — AB (ref 3.5–5.1)
Sodium: 141 mmol/L (ref 135–145)
TOTAL PROTEIN: 5.5 g/dL — AB (ref 6.5–8.1)

## 2016-07-12 LAB — TSH: TSH: 2.347 u[IU]/mL (ref 0.350–4.500)

## 2016-07-12 LAB — PROTIME-INR
INR: 1.23
PROTHROMBIN TIME: 15.6 s — AB (ref 11.4–15.2)

## 2016-07-12 MED ORDER — POTASSIUM CHLORIDE CRYS ER 20 MEQ PO TBCR
20.0000 meq | EXTENDED_RELEASE_TABLET | Freq: Once | ORAL | Status: DC
  Filled 2016-07-12: qty 1

## 2016-07-12 MED ORDER — CARVEDILOL 6.25 MG PO TABS
9.3750 mg | ORAL_TABLET | Freq: Two times a day (BID) | ORAL | Status: DC
  Administered 2016-07-12: 9.375 mg via ORAL
  Filled 2016-07-12: qty 1

## 2016-07-12 MED ORDER — VITAMIN D 1000 UNITS PO TABS
2000.0000 [IU] | ORAL_TABLET | Freq: Two times a day (BID) | ORAL | Status: DC
  Administered 2016-07-12 (×2): 2000 [IU] via ORAL
  Filled 2016-07-12 (×2): qty 2

## 2016-07-12 MED ORDER — LIDOCAINE 4 % EX CREA: TOPICAL_CREAM | Freq: Every day | CUTANEOUS | Status: DC | PRN

## 2016-07-12 MED ORDER — VITAMIN B-12 1000 MCG PO TABS
1000.0000 ug | ORAL_TABLET | Freq: Every day | ORAL | Status: DC
  Administered 2016-07-12: 1000 ug via ORAL
  Filled 2016-07-12: qty 1

## 2016-07-12 MED ORDER — MOMETASONE FURO-FORMOTEROL FUM 200-5 MCG/ACT IN AERO
2.0000 | INHALATION_SPRAY | Freq: Two times a day (BID) | RESPIRATORY_TRACT | Status: DC
  Administered 2016-07-12: 2 via RESPIRATORY_TRACT
  Filled 2016-07-12: qty 8.8

## 2016-07-12 MED ORDER — SODIUM CHLORIDE 0.9% FLUSH
3.0000 mL | Freq: Two times a day (BID) | INTRAVENOUS | Status: DC
  Administered 2016-07-12: 3 mL via INTRAVENOUS

## 2016-07-12 MED ORDER — POTASSIUM CHLORIDE CRYS ER 20 MEQ PO TBCR: 40.0000 meq | EXTENDED_RELEASE_TABLET | Freq: Once | ORAL | Status: DC

## 2016-07-12 MED ORDER — LIDOCAINE 4 % EX CREA
TOPICAL_CREAM | Freq: Once | CUTANEOUS | Status: AC
  Administered 2016-07-12: 1 via TOPICAL
  Filled 2016-07-12: qty 5

## 2016-07-12 MED ORDER — PANTOPRAZOLE SODIUM 40 MG PO TBEC
40.0000 mg | DELAYED_RELEASE_TABLET | Freq: Every day | ORAL | Status: DC
  Administered 2016-07-12: 40 mg via ORAL
  Filled 2016-07-12: qty 1

## 2016-07-12 MED ORDER — APIXABAN 5 MG PO TABS
5.0000 mg | ORAL_TABLET | Freq: Two times a day (BID) | ORAL | Status: DC
  Administered 2016-07-12 (×2): 5 mg via ORAL
  Filled 2016-07-12 (×2): qty 1

## 2016-07-12 MED ORDER — LISINOPRIL 5 MG PO TABS
5.0000 mg | ORAL_TABLET | Freq: Every day | ORAL | Status: DC
  Administered 2016-07-12: 5 mg via ORAL
  Filled 2016-07-12: qty 1

## 2016-07-12 MED ORDER — ALBUTEROL SULFATE (2.5 MG/3ML) 0.083% IN NEBU: 3.0000 mL | INHALATION_SOLUTION | Freq: Four times a day (QID) | RESPIRATORY_TRACT | Status: DC | PRN

## 2016-07-12 MED ORDER — DILTIAZEM HCL ER COATED BEADS 120 MG PO CP24
120.0000 mg | ORAL_CAPSULE | Freq: Every day | ORAL | Status: DC
  Administered 2016-07-12: 120 mg via ORAL
  Filled 2016-07-12: qty 1

## 2016-07-12 MED ORDER — CIPROFLOXACIN HCL 500 MG PO TABS: 500.0000 mg | ORAL_TABLET | Freq: Two times a day (BID) | ORAL | 0 refills | Status: AC

## 2016-07-12 MED ORDER — ACETAMINOPHEN 325 MG PO TABS
650.0000 mg | ORAL_TABLET | ORAL | Status: DC | PRN
  Administered 2016-07-12: 650 mg via ORAL
  Filled 2016-07-12: qty 2

## 2016-07-12 MED ORDER — POTASSIUM CHLORIDE CRYS ER 20 MEQ PO TBCR
20.0000 meq | EXTENDED_RELEASE_TABLET | Freq: Once | ORAL | Status: AC
  Administered 2016-07-12: 20 meq via ORAL
  Filled 2016-07-12: qty 1

## 2016-07-12 MED ORDER — AMOXICILLIN-POT CLAVULANATE 875-125 MG PO TABS
1.0000 | ORAL_TABLET | Freq: Two times a day (BID) | ORAL | Status: DC
  Administered 2016-07-12 (×2): 1 via ORAL
  Filled 2016-07-12 (×2): qty 1

## 2016-07-12 MED ORDER — CARVEDILOL 6.25 MG PO TABS: 9.3750 mg | ORAL_TABLET | Freq: Two times a day (BID) | ORAL | Status: DC

## 2016-07-12 MED ORDER — POTASSIUM CHLORIDE CRYS ER 10 MEQ PO TBCR
20.0000 meq | EXTENDED_RELEASE_TABLET | Freq: Every day | ORAL | Status: DC
  Administered 2016-07-12: 20 meq via ORAL
  Filled 2016-07-12: qty 2

## 2016-07-12 MED ORDER — SULFAMETHOXAZOLE-TRIMETHOPRIM 800-160 MG PO TABS: 1.0000 | ORAL_TABLET | Freq: Two times a day (BID) | ORAL | Status: DC

## 2016-07-12 MED ORDER — TRAZODONE HCL 100 MG PO TABS: 100.0000 mg | ORAL_TABLET | Freq: Every day | ORAL | Status: DC

## 2016-07-12 MED ORDER — FLECAINIDE ACETATE 100 MG PO TABS
100.0000 mg | ORAL_TABLET | Freq: Two times a day (BID) | ORAL | Status: DC
  Administered 2016-07-12: 100 mg via ORAL
  Filled 2016-07-12 (×2): qty 1

## 2016-07-12 MED ORDER — CIPROFLOXACIN HCL 500 MG PO TABS
750.0000 mg | ORAL_TABLET | Freq: Every day | ORAL | Status: DC
Start: 2016-07-13 — End: 2016-07-12

## 2016-07-12 MED ORDER — DILTIAZEM HCL ER COATED BEADS 120 MG PO CP24: 120.0000 mg | ORAL_CAPSULE | Freq: Every day | ORAL | 1 refills | Status: DC

## 2016-07-12 MED ORDER — ATORVASTATIN CALCIUM 10 MG PO TABS
10.0000 mg | ORAL_TABLET | Freq: Every day | ORAL | Status: DC
  Administered 2016-07-12: 10 mg via ORAL
  Filled 2016-07-12: qty 1

## 2016-07-12 NOTE — Discharge Summary (Signed)
Name: Suzanne Macdonald MRN: [id number] DOB: 21-Jul-1951 65 y.o. PCP: Bartholomew Crews, MD  Date of Admission: 07/11/2016  9:24 PM Date of Discharge: 07/12/2016 Attending Physician: Sid Falcon, MD  Discharge Diagnosis: A fib with RVR Active Problems:   Essential hypertension   Abdominal wall abscess   Atrial fibrillation with RVR ([place])   Atrial flutter ([place])   Discharge Medications:   Medication List    STOP taking these medications   amoxicillin-clavulanate 875-125 MG tablet Commonly known as:  AUGMENTIN     TAKE these medications   acetaminophen 500 MG tablet Commonly known as:  TYLENOL Take 1,000 mg by mouth every 6 (six) hours as needed for mild pain or moderate pain.   albuterol 108 (90 Base) MCG/ACT inhaler Commonly known as:  PROVENTIL HFA;VENTOLIN HFA Inhale 1-2 puffs into the lungs every 6 (six) hours as needed for wheezing or shortness of breath.   ALIVE WOMENS GUMMY Chew Chew 2 each by mouth daily.   apixaban 5 MG Tabs tablet Commonly known as:  ELIQUIS Take 1 tablet (5 mg total) by mouth 2 (two) times daily.   atorvastatin 10 MG tablet Commonly known as:  LIPITOR take 1 tablet by mouth once daily   budesonide-formoterol 160-4.5 MCG/ACT inhaler Commonly known as:  SYMBICORT Inhale 2 puffs into the lungs 2 (two) times daily.   CALCIUM/VITAMIN D3/ADULT GUMMY PO Take 2 each by mouth daily.   carvedilol 6.25 MG tablet Commonly known as:  COREG Take 9.375 mg by mouth 2 (two) times daily with a meal.   ciprofloxacin 500 MG tablet Commonly known as:  CIPRO Take 1 tablet (500 mg total) by mouth 2 (two) times daily.   cyanocobalamin 500 MCG tablet Take 1,000 mcg by mouth daily.   diltiazem 120 MG 24 hr capsule Commonly known as:  CARDIZEM CD Take 1 capsule (120 mg total) by mouth daily. Start taking on:  07/13/2016   docusate sodium 100 MG capsule Commonly known as:  COLACE Take 1 capsule (100 mg total) by mouth 2 (two) times daily as  needed for mild constipation.   flecainide 100 MG tablet Commonly known as:  TAMBOCOR Take 1 tablet (100 mg total) by mouth 2 (two) times daily. What changed:  how much to take   lisinopril 5 MG tablet Commonly known as:  PRINIVIL,ZESTRIL Take 1 tablet (5 mg total) by mouth daily.   loperamide 2 MG capsule Commonly known as:  IMODIUM Take 2 mg by mouth daily as needed for diarrhea or loose stools.   omeprazole 40 MG capsule Commonly known as:  PRILOSEC Take 1 capsule (40 mg total) by mouth daily.   potassium chloride 10 MEQ tablet Commonly known as:  K-DUR 2 TABLETS BY MOUTH DAILY   prochlorperazine 10 MG tablet Commonly known as:  COMPAZINE Take 10 mg by mouth every 6 (six) hours as needed for nausea or vomiting.   traZODone 100 MG tablet Commonly known as:  DESYREL Take 100 mg by mouth at bedtime.   Vitamin D3 2000 units Chew Chew 2,000 Units by mouth 2 (two) times daily.       Disposition and follow-up:   [name] was discharged from [facility] in Good condition.  At the hospital follow up visit please address:  1.   --has she been taking cipro twice a day? Any side effects? --has she been taking the cardizem for her A fib? Any more chest discomfort or palpitations?  2.  Labs / imaging  needed at time of follow-up: CBC to evaluate WBC, BMP to check K   3.  Pending labs/ test needing follow-up: none  Follow-up Appointments: Follow-up Information    Barrett, Suanne Marker, PA-C Follow up on 07/23/2016.   Specialties:  Cardiology, Radiology Why:  Cardiology Hospital Follow-Up on 07/23/2016 at 8:30AM Contact information: [address] [place] [address] [phone]        [facility] Follow up.   Why:  They will call you to set up a follow up appointment. Contact information: [address] [place]       [name], MD Follow up on 07/16/2016.     Specialty:  Infectious Diseases Why:  at 2pm Contact information: [address] [place] [address] [phone]           Hospital Course by problem list: Active Problems:   Essential hypertension   Abdominal wall abscess   Atrial fibrillation with RVR (H[facility])   Atrial flutter ([facility])   A fib with RVR: Patient presented to the [facility] with chest pressure and palpitations that she has in the past associated with episodes of a fib. In the past, these episodes would only last seconds before relieving, but at the time of presentation it was lasting a lot longer. In the ED she was found to be in A fib with RVR. Patient with history of paroxysmal A fib which was previously controlled on Flecainide '100mg'$  BID and carvedilol 9.'375mg'$  BID, and at recent hospitalization (discharge 10/26), patient was started on cardizem '120mg'$  daily; she is on Eliquis for anticoagulation. Patient had not been taking the cardizem because of name confusion with carvedilol. Cardiology was consulted in the ED and patient was started on cardizem drip with good response. EKG's were not concerning for acute ischemia that might have caused a conversion to a fib. Patient converted to sinus rhythm with resolution of symptoms, and was transitioned to PO cardizem '120mg'$  daily as instructed before. A thorough discussion was had with patient about her medications and proper administration and this was also provided in writing in two different ways.   Abdominal wall abscess: Patient with history of large incarcerated ventral hernia repair in 09/7060 with complication of large intraabdominal abscess. IR at the time was consulted and patient had drain placed which is still in tact at this admission. Patient was placed on IV antibiotics for pansensitive E coli, and transitioned to PO on 06/20/2016 with Augmentin BID. Patient endorses taking Augmentin only once a day because of diarrhea with BID dosing. No increase in drainage was  noted, patient did not have leukocytosis, and remained afebrile. Patient was switched to Ciprofloxacin '500mg'$  BID for better GI side effect profile. Patient has f/u with IR; at last visit CT suggested small air-fluid collection adjacent to surgical anastomosis. They plan to do fluoroscopy at next visit to evaluate for possible fistula formation that is causing this suspected abscess. Patient was advised about taking antibiotics as prescribed, following up with ID on 11/27, and following up with IR on Dec 19th.  HTN: Patient with history of HTN managed with Lisinopril '5mg'$  daily. This was continued.  Hypokalemia: Patient with chronically mild hypokalemia during recent admissions; at current admission K of 3.3 which was repleted orally. On speaking with patient, she does not take her home Kdur due to taste. Nurse aided with this by instructing patient to take it with orange juice, which was tolerable. Patient maintained that she  will do this from now on and take her home K dur 61mq daily.  HLD Patient was continued on her home Atorvastatin '10mg'$  daily.  GERD Patient was continued on her home dose of omeprazole '40mg'$  daily  Asthma Patient was continued on her home regimen of Proventil and Dulera.  Discharge Vitals:   BP (!) 153/64 (BP Location: Right Arm)   Pulse 70   Temp 98.4 F (36.9 C) (Oral)   Resp 18   Ht '5\' 2"'$  (1.575 m)   Wt 124.8 kg (275 lb 3.2 oz) Comment: scale A  SpO2 98%   BMI 50.33 kg/m   Pertinent Labs, Studies, and Procedures:  CBC Latest Ref Rng & Units 07/12/2016 07/11/2016 06/14/2016  WBC 4.0 - 10.5 K/uL 7.7 8.2 8.6  Hemoglobin 12.0 - 15.0 g/dL 9.7(L) 11.1(L) 8.9(L)  Hematocrit 36.0 - 46.0 % 32.5(L) 36.9 30.0(L)  Platelets 150 - 400 K/uL 298 319 326   CMP Latest Ref Rng & Units 07/12/2016 07/11/2016 06/12/2016  Glucose 65 - 99 mg/dL 113(H) 116(H) 110(H)  BUN 6 - 20 mg/dL '10 10 11  '$ Creatinine 0.44 - 1.00 mg/dL 0.79 0.89 0.82  Sodium 135 - 145 mmol/L 141 141 138    Potassium 3.5 - 5.1 mmol/L 3.2(L) 3.3(L) 3.5  Chloride 101 - 111 mmol/L 110 108 106  CO2 22 - 32 mmol/L 24 21(L) 26  Calcium 8.9 - 10.3 mg/dL 8.7(L) 9.1 7.7(L)  Total Protein 6.5 - 8.1 g/dL 5.5(L) - -  Total Bilirubin 0.3 - 1.2 mg/dL 0.5 - -  Alkaline Phos 38 - 126 U/L 60 - -  AST 15 - 41 U/L 31 - -  ALT 14 - 54 U/L 18 - -   PT 07/11/2016 - 15.6 TSH 07/11/2016 - 2.347 Trop 07/11/2016 - negative CXR 07/11/2016 - no active cardiopulmonary disease.  Discharge Instructions: Discharge Instructions    Diet - low sodium heart healthy    Complete by:  As directed    Discharge instructions    Complete by:  As directed    For your infection, please pick up Ciprofloxacin - take it every 12 hours starting tonight at 9pm. Please keep your appointment with the infection doctor on Nov 27th.  Please keep the appointment with interventional radiation on Dec 19th.  Please follow up with the surgeons.   For your heart: Pick up prescription for Cardizem. Take one tablet ('120mg'$ ) once a day. Take Carvedilol one and a half pills twice a day. Take Flecainide '100mg'$  (one tablet) twice a day. Please keep the appointment with cardiology on Dec 12th.   Our clinic will call you with a hospital follow up appointment early next week - be on the lookout for the call.   Happy thanksgiving!   Increase activity slowly    Complete by:  As directed       Signed: GAlphonzo Grieve MD 07/12/2016, 1:25 PM   Pager [phone]

## 2016-07-12 NOTE — ED Notes (Signed)
Report given to [facility].  Patient will be taken to [facility] on telemetry.  No acute distress noted

## 2016-07-12 NOTE — Progress Notes (Signed)
Patient Name: Suzanne Macdonald Date of Encounter: 07/12/2016  Primary Cardiologist: Dr. [facility] Problem List     Active Problems:   Essential hypertension   Abdominal wall abscess   Atrial fibrillation with RVR (HCC)   Atrial flutter (HCC)     Subjective   Feels better.  Converted to NSR after midnight  Inpatient Medications    Scheduled Meds: . amoxicillin-clavulanate  1 tablet Oral BID  . apixaban  5 mg Oral BID  . atorvastatin  10 mg Oral Daily  . carvedilol  9.375 mg Oral BID WC  . cholecalciferol  2,000 Units Oral BID  . flecainide  100 mg Oral BID  . lisinopril  5 mg Oral Daily  . mometasone-formoterol  2 puff Inhalation BID  . pantoprazole  40 mg Oral Daily  . potassium chloride  20 mEq Oral Daily  . potassium chloride  20 mEq Oral Once  . sodium chloride flush  3 mL Intravenous Q12H  . traZODone  100 mg Oral QHS  . cyanocobalamin  1,000 mcg Oral Daily   Continuous Infusions: . diltiazem (CARDIZEM) infusion 5 mg/hr (07/12/16 0006)   PRN Meds: acetaminophen, albuterol   Vital Signs    Vitals:   07/12/16 0030 07/12/16 0303 07/12/16 0624 07/12/16 0948  BP: 145/75 (!) 142/81 (!) 153/64   Pulse: 73 70 70   Resp: '11 16 18   '$ Temp:  97.9 F (36.6 C) 98.4 F (36.9 C)   TempSrc:  Oral Oral   SpO2: 100% 97% 97% 98%  Weight:  124.8 kg (275 lb 3.2 oz)    Height:  '5\' 2"'$  (1.575 m)      Intake/Output Summary (Last 24 hours) at 07/12/16 1009 Last data filed at 07/12/16 0932  Gross per 24 hour  Intake              385 ml  Output              8.5 ml  Net            376.5 ml   Filed Weights   07/11/16 2118 07/12/16 0303  Weight: 128.4 kg (283 lb) 124.8 kg (275 lb 3.2 oz)     GEN: Well nourished, well developed, in no acute distress.  HEENT: Grossly normal.  Neck: Supple, no JVD, carotid bruits, or masses. Cardiac: RRR, no murmurs, rubs, or gallops. No clubbing, cyanosis, edema.  Radials/DP/PT 2+ and equal bilaterally.  Respiratory:   Respirations regular and unlabored, clear to auscultation bilaterally. GI: obese, known drain MS: no deformity or atrophy. Skin: warm and dry, no rash. Neuro:  Strength and sensation are intact. Psych: AAOx3.  Normal affect.  Labs    CBC  Recent Labs  07/11/16 2131 07/12/16 0457  WBC 8.2 7.7  HGB 11.1* 9.7*  HCT 36.9 32.5*  MCV 71.7* 71.7*  PLT 319 081   Basic Metabolic Panel  Recent Labs  07/11/16 2131 07/12/16 0457  NA 141 141  K 3.3* 3.2*  CL 108 110  CO2 21* 24  GLUCOSE 116* 113*  BUN 10 10  CREATININE 0.89 0.79  CALCIUM 9.1 8.7*   Liver Function Tests  Recent Labs  07/12/16 0457  AST 31  ALT 18  ALKPHOS 60  BILITOT 0.5  PROT 5.5*  ALBUMIN 3.1*   No results for input(s): LIPASE, AMYLASE in the last 72 hours. Cardiac Enzymes  Recent Labs  07/11/16 2131  TROPONINI <0.03   BNP Invalid input(s): POCBNP D-Dimer No results  for input(s): DDIMER in the last 72 hours. Hemoglobin A1C No results for input(s): HGBA1C in the last 72 hours. Fasting Lipid Panel No results for input(s): CHOL, HDL, LDLCALC, TRIG, CHOLHDL, LDLDIRECT in the last 72 hours. Thyroid Function Tests  Recent Labs  07/12/16 0457  TSH 2.347    Telemetry    NSR - Personally Reviewed  ECG    NSR, changed from AFib - Personally Reviewed  Radiology    Dg Chest Portable 1 View  Result Date: 07/11/2016 CLINICAL DATA:  Chest pain EXAM: PORTABLE CHEST 1 VIEW COMPARISON:  Chest radiograph 06/09/2016 FINDINGS: Right chest wall Port-A-Cath tip overlies the lower SVC, unchanged. Cardiomediastinal contours are normal. No focal airspace consolidation or pulmonary edema. No pneumothorax pleural effusion. IMPRESSION: No active disease. Electronically Signed   By: Ulyses Jarred M.D.   On: 07/11/2016 22:03    Cardiac Studies     Patient Profile     65 y/o with atrial flutter, AFib, converted after Dilt IV was given.  Assessment & Plan    Would change Diltiazem from IV to 120  mg oral of diltiazem CD.    Continue Eliquis.    OK to d/c from a cardiac standpoint.  F/u with Dr. Oval Linsey.  Signed, Larae Grooms, MD  07/12/2016, 10:09 AM

## 2016-07-12 NOTE — Progress Notes (Addendum)
   Subjective:  Patient denies further episodes of chest discomfort; no shortness of breath, abdominal pain, fevers or chills.   Per nursing, after cardiology spoke to her, patient endorsed wanting to go home today.   Objective:  Vital signs in last 24 hours: Vitals:   07/12/16 0030 07/12/16 0303 07/12/16 0624 07/12/16 0948  BP: 145/75 (!) 142/81 (!) 153/64   Pulse: 73 70 70   Resp: '11 16 18   '$ Temp:  97.9 F (36.6 C) 98.4 F (36.9 C)   TempSrc:  Oral Oral   SpO2: 100% 97% 97% 98%  Weight:  124.8 kg (275 lb 3.2 oz)    Height:  '5\' 2"'$  (1.575 m)     Constitutional: NAD, pleasant, VS reviewed CV: RRR, no murmurs, rubs or gallops appreciated, pulses intact throughout Resp: CTAB, no increased work of breathing, no crackles or wheezing Abd: soft, +BS, NDNT, drain in place in RLQ with scant amount of slightly purulent fluid MSK: moves all 4 extremities freely  Assessment/Plan:  Active Problems:   Essential hypertension   Abdominal wall abscess   Atrial fibrillation with RVR (HCC)   Atrial flutter (HCC)  Paroxysmal Atrial Fibrillation with RVR Patient converted to SR with diltiazem drip. Cardiology ok with PO cardizem '120mg'$  daily and agree with discharge.  -Continue flecainide 100 mg BID, Carvedilol 9.375 mg BID -cardizem '120mg'$  PO daily  Abdominal Wall Abscess Small amount of purulent drainage present. Will switch to cipro '500mg'$  BID - less GI side effect -Pt to keep f/u apt with ID -f/u with surgery  HTN Hx of HTN and recently started on Lisinopril '5mg'$ /d/c HCTZ during her most recent admission. Can consider increasing lisinopril to '10mg'$  daily on outpatient follow up. -Continue Lisinopril '5mg'$  -Monitor pressures  Hypokalemia Hx of mild hypokalemia during recent admissions. Repleting with 17mq PO Kdur   HLD -Atorvastatin '10mg'$  daily continued  GERD Controlled. -Continue omeprazole '40mg'$  daily  Asthma Controlled. -Continue Proventil and Dulera  Dispo:  Anticipated discharge possibly today.    GAlphonzo Grieve MD 07/12/2016, 10:48 AM Pager [phone]

## 2016-07-12 NOTE — Progress Notes (Signed)
Pt has orders to be discharged. Discharge instructions given and pt has no additional questions at this time. Medication regimen reviewed and pt educated. Pt verbalized understanding and has no additional questions. Telemetry box removed. IV removed and site in good condition. Pt stable and waiting for transportation.   Chloee Tena RN 

## 2016-07-12 NOTE — Consult Note (Signed)
CARDIOLOGY CONSULT NOTE      Patient ID: Suzanne Macdonald MRN: [id number] DOB/AGE: August 14, 1951 65 y.o.  Admit date: 07/11/2016 Referring PhysicianEmily Marsh Dolly, MD Primary PhysicianBUTCHER,ELIZABETH, MD Primary Cardiologist Dr. Oval Linsey Reason for Consultation AFlutter  HPI: 65 year old with known atrial fibrillation. She is on flecainide. She had a hernia operation in September. This was, care by an abdominal wall abscess. This was treated October 22 with a CT-guided drain placement. She was off of her Eliquis at that time. She is unsure when her Eliquis was restarted.  At about 7 PM, she noted all dictations. She came to the emergency room and she was found to be in atrial flutter with 2:1 conduction. Heart rates of been in the 140s. She has some chest discomfort with a high heart rate.  Review of systems complete and found to be negative unless listed above   Past Medical History:  Diagnosis Date  . Asthma   . Atrial fibrillation ([place])   . Cervical cancer ([place]) 1972  . Colon cancer ([place])   . H/O gastric bypass   . Hyperlipidemia   . Hypertension   . Morbid obesity ([place]) 03/04/2016  . Sleep apnea     Family History  Problem Relation Age of Onset  . Heart disease Mother   . Diabetes Mother   . Heart disease Father   . Stroke Father   . Diabetes Father   . Diabetes Sister   . Thyroid disease Sister   . Early death Brother     Killed by drunk driver  . Heart attack Brother   . Thyroid disease Daughter     s/p thyroidectomy  . Diabetes Sister   . Thyroid disease Sister   . Stroke Brother   . Drug abuse Brother   . Heart attack Brother     Social History   Social History  . Marital status: Married    Spouse name: N/A  . Number of children: N/A  . Years of education: N/A   Occupational History  . Not on file.   Social History Main Topics  . Smoking status: Former Smoker    Years: 0.20    Types: Cigarettes  . Smokeless tobacco: Never Used  . Alcohol use No       Comment: a beer every now and then  . Drug use: No  . Sexual activity: No   Other Topics Concern  . Not on file   Social History Narrative  . No narrative on file    Past Surgical History:  Procedure Laterality Date  . ABDOMINAL HYSTERECTOMY  1998   Salpingoophorectomy. For tumorous growth.  . BOWEL RESECTION  2015   [place] by Dr. Stormy Fabian  . CARDIAC CATHETERIZATION  05/2014   in [place], no sig CAD (done prior to surgery for colon CA)  . GASTRIC BYPASS  1992  . INSERTION OF MESH N/A 05/01/2016   Procedure: INSERTION OF MESH;  Surgeon: Excell Seltzer, MD;  Location: [facility];  Service: General;  Laterality: N/A;  . IR GENERIC HISTORICAL  06/28/2016   IR RADIOLOGIST EVAL & MGMT 06/28/2016 Markus Daft, MD [facility] INTERV RAD  . LAPAROSCOPIC GASTRIC BANDING     Placed 2013 and removed in 2014.  Marland Kitchen VENTRAL HERNIA REPAIR N/A 05/01/2016   Procedure: VENTRAL HERNIA REPAIR;  Surgeon: Excell Seltzer, MD;  Location: [facility];  Service: General;  Laterality: N/A;      (Not in a hospital admission)  Physical Exam: Vitals:   Vitals:  07/11/16 2210 07/11/16 2215 07/11/16 2220 07/11/16 2225  BP: 155/99 (!) 172/108 151/97 135/86  Pulse: (!) 144 (!) 143 (!) 56 76  Resp: '16 15 16 19  '$ Temp:      SpO2: 99% 99% 100% 99%  Weight:      Height:       I&O's:  No intake or output data in the 24 hours ending 07/12/16 0000 Physical exam:  [place]/AT EOMI No JVD, No carotid bruit Tachycardic S1S2  No wheezing  obese No edema. No focal motor or sensory deficits Normal affect No rash  Labs:   Lab Results  Component Value Date   WBC 8.2 07/11/2016   HGB 11.1 (L) 07/11/2016   HCT 36.9 07/11/2016   MCV 71.7 (L) 07/11/2016   PLT 319 07/11/2016    Recent Labs Lab 07/11/16 2131  NA 141  K 3.3*  CL 108  CO2 21*  BUN 10  CREATININE 0.89  CALCIUM 9.1  GLUCOSE 116*   Lab Results  Component Value Date   TROPONINI <0.03 07/11/2016    Lab Results  Component Value Date    CHOL 227 (H) 01/24/2016   Lab Results  Component Value Date   HDL 50 01/24/2016   Lab Results  Component Value Date   LDLCALC 151 (H) 01/24/2016   Lab Results  Component Value Date   TRIG 131 01/24/2016   Lab Results  Component Value Date   CHOLHDL 4.5 (H) 01/24/2016   No results found for: LDLDIRECT    Radiology: No active disease EKG: Atrial fibrillation with rapid ventricular response  ASSESSMENT AND PLAN:  Active Problems:   Atrial fibrillation with RVR (HCC) Atrial flutter Start IV Cardizem for rate control. Hopefully, she will convert back to normal sinus rhythm. If she does not, could consider cardioversion. We'll have to determine when she restart her Eliquis and when the last interruption and anticoagulation therapy was. If she has had 4 weeks of anticoagulation without interruption, could plan on cardioversion alone. Otherwise, would need TEE cardioversion to treat her atrial flutter if she does not convert. Continue flecainide and continue metoprolol at this time. Her blood pressure has been elevated and will tolerate these additional medications. Will follow. Signed:   Mina Marble, MD, [facility] 07/12/2016, 12:00 AM

## 2016-07-16 ENCOUNTER — Telehealth: Payer: Self-pay | Admitting: *Deleted

## 2016-07-16 ENCOUNTER — Inpatient Hospital Stay: Payer: Medicare Other | Admitting: Infectious Diseases

## 2016-07-16 NOTE — Telephone Encounter (Signed)
Attempted to call patient regarding missed visit. No answer, voicemail is not yet set up. Landis Gandy, RN

## 2016-07-17 ENCOUNTER — Ambulatory Visit: Payer: Medicare Other | Admitting: Cardiology

## 2016-07-18 ENCOUNTER — Telehealth: Payer: Self-pay | Admitting: Internal Medicine

## 2016-07-18 DIAGNOSIS — L02211 Cutaneous abscess of abdominal wall: Secondary | ICD-10-CM | POA: Diagnosis not present

## 2016-07-18 DIAGNOSIS — Z438 Encounter for attention to other artificial openings: Secondary | ICD-10-CM | POA: Diagnosis not present

## 2016-07-18 DIAGNOSIS — J45909 Unspecified asthma, uncomplicated: Secondary | ICD-10-CM | POA: Diagnosis not present

## 2016-07-18 DIAGNOSIS — Z452 Encounter for adjustment and management of vascular access device: Secondary | ICD-10-CM | POA: Diagnosis not present

## 2016-07-18 DIAGNOSIS — I700 Atherosclerosis of aorta: Secondary | ICD-10-CM | POA: Diagnosis not present

## 2016-07-18 DIAGNOSIS — I10 Essential (primary) hypertension: Secondary | ICD-10-CM | POA: Diagnosis not present

## 2016-07-18 NOTE — Telephone Encounter (Signed)
[facility] REQUESTING  A VERBAL ORDER TO EXTEND NURSING VISITS TO MAKE SURE SHE IS OVER HER INFECTION.  PATIENT HAS BEEN HAVING ELEVATED/IRREGULAR  HEARTBEATS.

## 2016-07-19 ENCOUNTER — Encounter: Payer: Medicare Other | Admitting: Internal Medicine

## 2016-07-19 ENCOUNTER — Telehealth: Payer: Self-pay

## 2016-07-19 NOTE — Telephone Encounter (Signed)
[name] from [facility] requesting VO. Please call back.

## 2016-07-20 ENCOUNTER — Telehealth: Payer: Self-pay | Admitting: *Deleted

## 2016-07-20 NOTE — Telephone Encounter (Signed)
VO given for 1x week for 4weeks, do you agree?

## 2016-07-20 NOTE — Telephone Encounter (Signed)
yes

## 2016-07-23 ENCOUNTER — Encounter: Payer: Self-pay | Admitting: Physician Assistant

## 2016-07-23 ENCOUNTER — Telehealth: Payer: Self-pay | Admitting: Physician Assistant

## 2016-07-23 ENCOUNTER — Ambulatory Visit (INDEPENDENT_AMBULATORY_CARE_PROVIDER_SITE_OTHER): Payer: Medicare Other | Admitting: Physician Assistant

## 2016-07-23 VITALS — BP 154/75 | HR 65 | Ht 62.0 in | Wt 280.2 lb

## 2016-07-23 DIAGNOSIS — I10 Essential (primary) hypertension: Secondary | ICD-10-CM

## 2016-07-23 DIAGNOSIS — Z7901 Long term (current) use of anticoagulants: Secondary | ICD-10-CM | POA: Diagnosis not present

## 2016-07-23 DIAGNOSIS — I480 Paroxysmal atrial fibrillation: Secondary | ICD-10-CM | POA: Diagnosis not present

## 2016-07-23 MED ORDER — CARVEDILOL 12.5 MG PO TABS: 12.5000 mg | ORAL_TABLET | Freq: Two times a day (BID) | ORAL | 2 refills | Status: DC

## 2016-07-23 NOTE — Telephone Encounter (Signed)
Discussed Flecainide with Suanne Marker B PA and patient should be taking 100 mg twice a day Spoke with patient and she is taking Flecainide 50 mg twice a day Advised to increase to 100 mg twice daily, verbalized understanding

## 2016-07-23 NOTE — Patient Instructions (Addendum)
Medication Instructions:  INCREASE- Carvedilol 12.5 mg twice a day  Labwork: None Ordered  Testing/Procedures: None Ordered  Follow-Up: You have been referred to [place] physician recommends that you schedule a follow-up appointment in: Keep appointment with Dr Oval Linsey on December 13th @ 9:30 am   Any Other Special Instructions Will Be Listed Below (If Applicable).   If you need a refill on your cardiac medications before your next appointment, please call your pharmacy.

## 2016-07-23 NOTE — Progress Notes (Signed)
Cardiology Office Note   Date:  07/23/2016   ID:  Suzanne Macdonald, DOB [birth year], MRN [id number]  PCP:  [name], MD  Cardiologist:  Dr Oliver Barre, PA-C   Chief Complaint  Patient presents with  . Follow-up    having palpitations for a while now, thinks meds is not controlling afib    History of Present Illness: Suzanne Macdonald is a 65 y.o. female with a history of atrial fib on Flecainide, Coreg, and Eliquis; cancer, HTN, HLD, OSA, morbid obesity s/p gastric bypass.   11/23, pt seen in ER for Atrial flutter w/ HR 140s. Off Eliquis for procedure, unclear when it was restarted. Diltiazem added, office f/u.  Suzanne Macdonald presents for management of her atrial fibrillation.  She has with her a sheet that illustrates her high heart rates over the past weekend. She converted spontaneously to SR this am. Someone talked to her about a shock that would help her not go into afib. She is interested in this.  The atrial fib is exhausting to her. Her heart rate is always elevated, never controlled when in the arrhythmia. It gives her pain in her chest and arm, fatigue. + DOE. She feels she spends most of her time in atrial fib.   Her legs have not been swelling, she has not been on her CPAP because she cannot afford it. She gets very little from disability, her husband helps out. She is compliant with her medications.   When her heart rate is controlled, her BP is elevated, but her heart rate is never <65. She still has a drain in her abdomen but that does not bother her much.   Past Medical History:  Diagnosis Date  . Asthma   . Atrial fibrillation ([place])   . Cervical cancer ([place]) 1972  . Colon cancer ([place])   . H/O gastric bypass   . Hyperlipidemia   . Hypertension   . Morbid obesity ([place]) 03/04/2016  . Sleep apnea     Past Surgical History:  Procedure Laterality Date  . ABDOMINAL HYSTERECTOMY  1998   Salpingoophorectomy. For tumorous growth.  . BOWEL  RESECTION  2015   [place], [place] by Dr. Stormy Fabian  . CARDIAC CATHETERIZATION  05/2014   in [place], no sig CAD (done prior to surgery for colon CA)  . GASTRIC BYPASS  1992  . INSERTION OF MESH N/A 05/01/2016   Procedure: INSERTION OF MESH;  Surgeon: Excell Seltzer, MD;  Location: [facility];  Service: General;  Laterality: N/A;  . IR GENERIC HISTORICAL  06/28/2016   IR RADIOLOGIST EVAL & MGMT 06/28/2016 Markus Daft, MD [facility] INTERV RAD  . LAPAROSCOPIC GASTRIC BANDING     Placed 2013 and removed in 2014.  Marland Kitchen VENTRAL HERNIA REPAIR N/A 05/01/2016   Procedure: VENTRAL HERNIA REPAIR;  Surgeon: Excell Seltzer, MD;  Location: [facility];  Service: General;  Laterality: N/A;    Current Outpatient Prescriptions  Medication Sig Dispense Refill  . acetaminophen (TYLENOL) 500 MG tablet Take 1,000 mg by mouth every 6 (six) hours as needed for mild pain or moderate pain.    Marland Kitchen albuterol (PROVENTIL HFA;VENTOLIN HFA) 108 (90 Base) MCG/ACT inhaler Inhale 1-2 puffs into the lungs every 6 (six) hours as needed for wheezing or shortness of breath.    Marland Kitchen apixaban (ELIQUIS) 5 MG TABS tablet Take 1 tablet (5 mg total) by mouth 2 (two) times daily. 180 tablet 1  . atorvastatin (LIPITOR) 10 MG tablet take 1 tablet by mouth  once daily 90 tablet 1  . budesonide-formoterol (SYMBICORT) 160-4.5 MCG/ACT inhaler Inhale 2 puffs into the lungs 2 (two) times daily. 1 Inhaler 1  . Calcium-Phosphorus-Vitamin D (CALCIUM/VITAMIN D3/ADULT GUMMY PO) Take 2 each by mouth daily.    . carvedilol (COREG) 6.25 MG tablet Take 9.375 mg by mouth 2 (two) times daily with a meal.    . Cholecalciferol (VITAMIN D3) 2000 units CHEW Chew 2,000 Units by mouth 2 (two) times daily.    . ciprofloxacin (CIPRO) 500 MG tablet Take 500 mg by mouth 2 (two) times daily.    . cyanocobalamin 500 MCG tablet Take 1,000 mcg by mouth daily.     Marland Kitchen diltiazem (CARDIZEM CD) 120 MG 24 hr capsule Take 1 capsule (120 mg total) by mouth daily. 90 capsule 1  . docusate  sodium (COLACE) 100 MG capsule Take 1 capsule (100 mg total) by mouth 2 (two) times daily as needed for mild constipation. 10 capsule 0  . flecainide (TAMBOCOR) 100 MG tablet Take 1 tablet (100 mg total) by mouth 2 (two) times daily. (Patient taking differently: Take 50 mg by mouth 2 (two) times daily. ) 60 tablet 0  . lisinopril (PRINIVIL,ZESTRIL) 5 MG tablet Take 1 tablet (5 mg total) by mouth daily. 30 tablet 0  . loperamide (IMODIUM) 2 MG capsule Take 2 mg by mouth daily as needed for diarrhea or loose stools.    . Multiple Vitamins-Minerals (ALIVE WOMENS GUMMY) CHEW Chew 2 each by mouth daily.    Marland Kitchen omeprazole (PRILOSEC) 40 MG capsule Take 1 capsule (40 mg total) by mouth daily. 90 capsule 1  . potassium chloride (K-DUR) 10 MEQ tablet 2 TABLETS BY MOUTH DAILY 60 tablet 5  . prochlorperazine (COMPAZINE) 10 MG tablet Take 10 mg by mouth every 6 (six) hours as needed for nausea or vomiting.    . traZODone (DESYREL) 100 MG tablet Take 100 mg by mouth at bedtime.  0   No current facility-administered medications for this visit.     Allergies:   Augmentin [amoxicillin-pot clavulanate]; Scallops [shellfish allergy]; and Contrast media [iodinated diagnostic agents]    Social History:  The patient  reports that she has quit smoking. Her smoking use included Cigarettes. She quit after 0.20 years of use. She has never used smokeless tobacco. She reports that she does not drink alcohol or use drugs.   Family History:  The patient's family history includes Diabetes in her father, mother, sister, and sister; Drug abuse in her brother; Early death in her brother; Heart attack in her brother and brother; Heart disease in her father and mother; Stroke in her brother and father; Thyroid disease in her daughter, sister, and sister.    ROS:  Please see the history of present illness. All other systems are reviewed and negative.    PHYSICAL EXAM: VS:  BP (!) 154/75 (BP Location: Left Arm, Patient Position:  Sitting, Cuff Size: Large)   Pulse 65   Ht '5\' 2"'$  (1.575 m)   Wt 280 lb 4 oz (127.1 kg)   BMI 51.26 kg/m  , BMI Body mass index is 51.26 kg/m. GEN: Well nourished, well developed, female in no acute distress  HEENT: normal for age  Neck: no JVD, no carotid bruit, no masses Cardiac: RRR; soft murmur, no rubs, or gallops Respiratory:  clear to auscultation bilaterally, normal work of breathing GI: soft, nontender, nondistended, + BS. Drain present, no signs of infection MS: no deformity or atrophy; no edema; distal pulses are 2+ in  all 4 extremities   Skin: warm and dry, no rash Neuro:  Strength and sensation are intact   EKG:  EKG is ordered today. The ekg ordered today demonstrates SR,    Recent Labs: 03/02/2016: B Natriuretic Peptide 98.9 06/11/2016: Magnesium 1.7 07/12/2016: ALT 18; BUN 10; Creatinine, Ser 0.79; Hemoglobin 9.7; Platelets 298; Potassium 3.2; Sodium 141; TSH 2.347    Lipid Panel    Component Value Date/Time   CHOL 227 (H) 01/24/2016 0952   TRIG 131 01/24/2016 0952   HDL 50 01/24/2016 0952   CHOLHDL 4.5 (H) 01/24/2016 0952   LDLCALC 151 (H) 01/24/2016 0952     Wt Readings from Last 3 Encounters:  07/23/16 280 lb 4 oz (127.1 kg)  07/12/16 275 lb 3.2 oz (124.8 kg)  06/27/16 283 lb 6.4 oz (128.5 kg)     Other studies Reviewed: Additional studies/ records that were reviewed today include: office notes and testing.  ASSESSMENT AND PLAN:  1.  PAF: Pt converted spontaneously to SR prior to arrival. However, her notes from this weekend show a consistently elevated HR. Increase Coreg to 12.5 mg bid. Refer to the afib clinic to discuss anti-arrhythmic therapy since the Flecainide is not working. She may also be interested in ablation.   2. HTN: her BP should tolerate an increase in her Coreg.  3. Chronic anticoagulation: no issues or concerns with this.   Current medicines are reviewed at length with the patient today.  The patient does not have concerns  regarding medicines.  The following changes have been made:  no change  Labs/ tests ordered today include:  No orders of the defined types were placed in this encounter.    Disposition:   FU with Maximino Greenland at the afib clinic and Dr Oval Linsey  Signed, Rosaria Ferries, PA-C  07/23/2016 9:11 AM    [place] Phone: [phone]; Fax: [phone]  This note was written with the assistance of speech recognition software. Please excuse any transcriptional errors.

## 2016-07-23 NOTE — Addendum Note (Signed)
Addended by: Alvina Filbert B on: 07/23/2016 01:36 PM   Modules accepted: Orders

## 2016-07-23 NOTE — Telephone Encounter (Signed)
New message  Pt is returning call to [facility]  Please call back

## 2016-07-24 NOTE — Telephone Encounter (Signed)
Spoke to tracy Friday 12/1 and gave VO for continued visits

## 2016-07-26 ENCOUNTER — Other Ambulatory Visit: Payer: Self-pay | Admitting: Cardiovascular Disease

## 2016-07-26 ENCOUNTER — Encounter: Payer: Medicare Other | Admitting: Internal Medicine

## 2016-07-26 DIAGNOSIS — J45909 Unspecified asthma, uncomplicated: Secondary | ICD-10-CM | POA: Diagnosis not present

## 2016-07-26 DIAGNOSIS — Z438 Encounter for attention to other artificial openings: Secondary | ICD-10-CM | POA: Diagnosis not present

## 2016-07-26 DIAGNOSIS — I10 Essential (primary) hypertension: Secondary | ICD-10-CM | POA: Diagnosis not present

## 2016-07-26 DIAGNOSIS — L02211 Cutaneous abscess of abdominal wall: Secondary | ICD-10-CM | POA: Diagnosis not present

## 2016-07-26 DIAGNOSIS — Z452 Encounter for adjustment and management of vascular access device: Secondary | ICD-10-CM | POA: Diagnosis not present

## 2016-07-26 DIAGNOSIS — I700 Atherosclerosis of aorta: Secondary | ICD-10-CM | POA: Diagnosis not present

## 2016-07-26 NOTE — Telephone Encounter (Signed)
Please review for refill. Thanks!  

## 2016-07-30 ENCOUNTER — Encounter (HOSPITAL_COMMUNITY): Payer: Self-pay | Admitting: Nurse Practitioner

## 2016-07-30 ENCOUNTER — Ambulatory Visit (HOSPITAL_COMMUNITY)
Admission: RE | Admit: 2016-07-30 | Discharge: 2016-07-30 | Disposition: A | Discharge status: Home / Self Care | Payer: Medicare Other | Source: Ambulatory Visit | Attending: Nurse Practitioner | Admitting: Nurse Practitioner

## 2016-07-30 VITALS — BP 124/70 | HR 63 | Ht 62.0 in | Wt 284.4 lb

## 2016-07-30 DIAGNOSIS — I480 Paroxysmal atrial fibrillation: Secondary | ICD-10-CM | POA: Diagnosis not present

## 2016-07-30 DIAGNOSIS — Z87891 Personal history of nicotine dependence: Secondary | ICD-10-CM | POA: Insufficient documentation

## 2016-07-30 DIAGNOSIS — Z880 Allergy status to penicillin: Secondary | ICD-10-CM | POA: Diagnosis not present

## 2016-07-30 DIAGNOSIS — I4891 Unspecified atrial fibrillation: Secondary | ICD-10-CM | POA: Diagnosis present

## 2016-07-30 DIAGNOSIS — Z7901 Long term (current) use of anticoagulants: Secondary | ICD-10-CM | POA: Diagnosis not present

## 2016-07-30 DIAGNOSIS — Z79899 Other long term (current) drug therapy: Secondary | ICD-10-CM | POA: Insufficient documentation

## 2016-07-30 DIAGNOSIS — G4730 Sleep apnea, unspecified: Secondary | ICD-10-CM | POA: Insufficient documentation

## 2016-07-30 LAB — CBC
HEMATOCRIT: 33.3 % — AB (ref 36.0–46.0)
HEMOGLOBIN: 10 g/dL — AB (ref 12.0–15.0)
MCH: 21.1 pg — AB (ref 26.0–34.0)
MCHC: 30 g/dL (ref 30.0–36.0)
MCV: 70.4 fL — ABNORMAL LOW (ref 78.0–100.0)
Platelets: 249 10*3/uL (ref 150–400)
RBC: 4.73 MIL/uL (ref 3.87–5.11)
RDW: 18.8 % — AB (ref 11.5–15.5)
WBC: 5.9 10*3/uL (ref 4.0–10.5)

## 2016-07-30 LAB — BASIC METABOLIC PANEL
Anion gap: 7 (ref 5–15)
BUN: 14 mg/dL (ref 6–20)
CALCIUM: 9 mg/dL (ref 8.9–10.3)
CHLORIDE: 108 mmol/L (ref 101–111)
CO2: 27 mmol/L (ref 22–32)
CREATININE: 0.76 mg/dL (ref 0.44–1.00)
GFR calc non Af Amer: >60 mL/min (ref >60)
Glucose, Bld: 102 mg/dL — ABNORMAL HIGH (ref 65–99)
Potassium: 3.9 mmol/L (ref 3.5–5.1)
Sodium: 142 mmol/L (ref 135–145)

## 2016-07-30 MED ORDER — DILTIAZEM HCL 30 MG PO TABS: ORAL_TABLET | ORAL | 3 refills | Status: AC

## 2016-07-30 NOTE — Progress Notes (Signed)
Primary Care Physician: [name], MD Referring Physician: Dr.    [name] is a 65 y.o. female with a h/o afib that is in the afib clinic for evaluation. She has a h/o afib dating back 10 years ago. She has been on flecainide x one year.She was in the hospital for treatment of abdominal wall abscess as well as afib with rvr. The abscess in a complication of the surgery. history of incarcerated ventral hernia repair in 04/2016. She recently finished her cipro given at time of  hospitalization 11/29. She has h/o colon cancer treated in 2015 and gastric bypass.  Rate control meds were recently increased and she is doing much better with no afib noted over the last week. She continues on eliquis. Usually when she eill break through into afib, episodes are less than 4 hours.  Today, she denies symptoms of palpitations, chest pain, shortness of breath, orthopnea, PND, lower extremity edema, dizziness, presyncope, syncope, or neurologic sequela. The patient is tolerating medications without difficulties and is otherwise without complaint today.   Past Medical History:  Diagnosis Date  . Asthma   . Atrial fibrillation ([place])   . Cervical cancer ([place]) 1972  . Colon cancer ([place])   . H/O gastric bypass   . Hyperlipidemia   . Hypertension   . Morbid obesity ([place]) 03/04/2016  . Sleep apnea    Past Surgical History:  Procedure Laterality Date  . ABDOMINAL HYSTERECTOMY  1998   Salpingoophorectomy. For tumorous growth.  . BOWEL RESECTION  2015   [place], [place] by Dr. Stormy Fabian  . CARDIAC CATHETERIZATION  05/2014   in [place], no sig CAD (done prior to surgery for colon CA)  . GASTRIC BYPASS  1992  . INSERTION OF MESH N/A 05/01/2016   Procedure: INSERTION OF MESH;  Surgeon: Excell Seltzer, MD;  Location: [facility];  Service: General;  Laterality: N/A;  . IR GENERIC HISTORICAL  06/28/2016   IR RADIOLOGIST EVAL & MGMT 06/28/2016 Markus Daft, MD [facility] INTERV RAD  . LAPAROSCOPIC GASTRIC  BANDING     Placed 2013 and removed in 2014.  Marland Kitchen VENTRAL HERNIA REPAIR N/A 05/01/2016   Procedure: VENTRAL HERNIA REPAIR;  Surgeon: Excell Seltzer, MD;  Location: [facility];  Service: General;  Laterality: N/A;    Current Outpatient Prescriptions  Medication Sig Dispense Refill  . acetaminophen (TYLENOL) 500 MG tablet Take 1,000 mg by mouth every 6 (six) hours as needed for mild pain or moderate pain.    Marland Kitchen albuterol (PROVENTIL HFA;VENTOLIN HFA) 108 (90 Base) MCG/ACT inhaler Inhale 1-2 puffs into the lungs every 6 (six) hours as needed for wheezing or shortness of breath.    Marland Kitchen apixaban (ELIQUIS) 5 MG TABS tablet Take 1 tablet (5 mg total) by mouth 2 (two) times daily. 180 tablet 1  . atorvastatin (LIPITOR) 10 MG tablet take 1 tablet by mouth once daily 90 tablet 1  . budesonide-formoterol (SYMBICORT) 160-4.5 MCG/ACT inhaler Inhale 2 puffs into the lungs 2 (two) times daily. 1 Inhaler 1  . Calcium-Phosphorus-Vitamin D (CALCIUM/VITAMIN D3/ADULT GUMMY PO) Take 2 each by mouth daily.    . carvedilol (COREG) 12.5 MG tablet Take 1 tablet (12.5 mg total) by mouth 2 (two) times daily with a meal. 90 tablet 2  . Cholecalciferol (VITAMIN D3) 2000 units CHEW Chew 2,000 Units by mouth 2 (two) times daily.    . cyanocobalamin 500 MCG tablet Take 1,000 mcg by mouth daily.     Marland Kitchen diltiazem (CARDIZEM CD) 120 MG 24 hr  capsule Take 1 capsule (120 mg total) by mouth daily. 90 capsule 1  . docusate sodium (COLACE) 100 MG capsule Take 1 capsule (100 mg total) by mouth 2 (two) times daily as needed for mild constipation. 10 capsule 0  . flecainide (TAMBOCOR) 100 MG tablet Take 100 mg by mouth 2 (two) times daily.    Marland Kitchen lisinopril (PRINIVIL,ZESTRIL) 5 MG tablet take 1 tablet by mouth once daily 30 tablet 11  . loperamide (IMODIUM) 2 MG capsule Take 2 mg by mouth daily as needed for diarrhea or loose stools.    . Multiple Vitamins-Minerals (ALIVE WOMENS GUMMY) CHEW Chew 2 each by mouth daily.    Marland Kitchen omeprazole (PRILOSEC) 40  MG capsule Take 1 capsule (40 mg total) by mouth daily. 90 capsule 1  . potassium chloride (K-DUR) 10 MEQ tablet 2 TABLETS BY MOUTH DAILY 60 tablet 5  . prochlorperazine (COMPAZINE) 10 MG tablet Take 10 mg by mouth every 6 (six) hours as needed for nausea or vomiting.    . traZODone (DESYREL) 100 MG tablet Take 100 mg by mouth at bedtime.  0  . diltiazem (CARDIZEM) 30 MG tablet Take 1 tablet every 4 hours AS NEEDED for AFIB fast heart rate >100 as long as blood pressure >100. 45 tablet 3   No current facility-administered medications for this encounter.     Allergies  Allergen Reactions  . Augmentin [Amoxicillin-Pot Clavulanate] Diarrhea  . Scallops [Shellfish Allergy] Hives  . Barium-Containing Compounds     Feels like cement in the body  . Contrast Media [Iodinated Diagnostic Agents] Other (See Comments)    Feels like cement I body    Social History   Social History  . Marital status: Married    Spouse name: N/A  . Number of children: N/A  . Years of education: N/A   Occupational History  . Not on file.   Social History Main Topics  . Smoking status: Former Smoker    Years: 0.20    Types: Cigarettes  . Smokeless tobacco: Never Used  . Alcohol use No     Comment: a beer every now and then  . Drug use: No  . Sexual activity: No   Other Topics Concern  . Not on file   Social History Narrative  . No narrative on file    Family History  Problem Relation Age of Onset  . Heart disease Mother   . Diabetes Mother   . Heart disease Father   . Stroke Father   . Diabetes Father   . Diabetes Sister   . Thyroid disease Sister   . Early death Brother     Killed by drunk driver  . Heart attack Brother   . Thyroid disease Daughter     s/p thyroidectomy  . Diabetes Sister   . Thyroid disease Sister   . Stroke Brother   . Drug abuse Brother   . Heart attack Brother     ROS- All systems are reviewed and negative except as per the HPI above  Physical Exam: Vitals:     07/30/16 0905  BP: 124/70  Pulse: 63  Weight: 284 lb 6.4 oz (129 kg)  Height: '5\' 2"'$  (1.575 m)   Wt Readings from Last 3 Encounters:  07/30/16 284 lb 6.4 oz (129 kg)  07/23/16 280 lb 4 oz (127.1 kg)  07/12/16 275 lb 3.2 oz (124.8 kg)    Labs: Lab Results  Component Value Date   NA 142 07/30/2016   K  3.9 07/30/2016   CL 108 07/30/2016   CO2 27 07/30/2016   GLUCOSE 102 (H) 07/30/2016   BUN 14 07/30/2016   CREATININE 0.76 07/30/2016   CALCIUM 9.0 07/30/2016   MG 1.7 06/11/2016   Lab Results  Component Value Date   INR 1.23 07/12/2016   Lab Results  Component Value Date   CHOL 227 (H) 01/24/2016   HDL 50 01/24/2016   LDLCALC 151 (H) 01/24/2016   TRIG 131 01/24/2016     GEN- The patient is well appearing, alert and oriented x 3 today.   Head- normocephalic, atraumatic Eyes-  Sclera clear, conjunctiva pink Ears- hearing intact Oropharynx- clear Neck- supple, no JVP Lymph- no cervical lymphadenopathy Lungs- Clear to ausculation bilaterally, normal work of breathing Heart- Regular rate and rhythm, no murmurs, rubs or gallops, PMI not laterally displaced GI- soft, NT, ND, + BS Extremities- no clubbing, cyanosis, or edema MS- no significant deformity or atrophy Skin- no rash or lesion Psych- euthymic mood, full affect Neuro- strength and sensation are intact  EKG- NSR , normal EKG Epic records reviewed Echo-- Left ventricle: The cavity size was normal. Wall thickness was   normal. Systolic function was normal. The estimated ejection   fraction was in the range of 55% to 60%. Wall motion was normal;   there were no regional wall motion abnormalities. Doppler   parameters are consistent with abnormal left ventricular   relaxation (grade 1 diastolic dysfunction). - Aortic valve: There was no stenosis. - Mitral valve: Mildly calcified annulus. There was no significant   regurgitation. - Left atrium: The atrium was mildly dilated. 40 mm. - Right ventricle: The  cavity size was normal. Systolic function   was normal. - Pulmonary arteries: No complete TR doppler jet so unable to   estimate PA systolic pressure. - Inferior vena cava: The vessel was normal in size. The   respirophasic diameter changes were in the normal range (>= 50%),   consistent with normal central venous pressure.  Impressions:  - Normal LV size with EF 55-60%. Normal RV size and systolic   function. No significant valvular abnormalities.     Assessment and Plan: 1. PAF Better on increased rate control of carvedilol and recent addition of cardizem  Continue flecainide 100 mg bid  Continue eliquis for chadsvasc score of  Will give short acting cardizem 30 mg to use as needed for breakthrough afib She would like to defer change of antiarrythmic's as this point. Multaq, amiodarone,  sotalol and Tikosyn were discussed  Ablation was also discussed Cbc/bmet drawn today as f/u from d/c instructions 11/23  2. Lifestyle issues   Has sleep apnea but does not use cpap Sleeps in a recliner which has stopped apnea spells and snoring per husband Morbid obesity- encouraged weight loss Has orthopedic issues which keeps her from regular exercise No alcohol, tobacco or excessive caffeine  F/u in one month, sooner if afib burden escalates   Butch Penny C. Lailana Shira, [facility] [address] [place], [place] [address] [phone]

## 2016-07-30 NOTE — Patient Instructions (Signed)
Your physician has recommended you make the following change in your medication: 1) Cardizem '30mg'$  -- take 1 tablet every 4 hours AS NEEDED for AFIB fast heart rate >100 as long as blood pressure >100.

## 2016-08-01 ENCOUNTER — Ambulatory Visit: Payer: Medicare Other | Admitting: Cardiovascular Disease

## 2016-08-02 DIAGNOSIS — J45909 Unspecified asthma, uncomplicated: Secondary | ICD-10-CM | POA: Diagnosis not present

## 2016-08-02 DIAGNOSIS — I10 Essential (primary) hypertension: Secondary | ICD-10-CM | POA: Diagnosis not present

## 2016-08-02 DIAGNOSIS — Z452 Encounter for adjustment and management of vascular access device: Secondary | ICD-10-CM | POA: Diagnosis not present

## 2016-08-02 DIAGNOSIS — I700 Atherosclerosis of aorta: Secondary | ICD-10-CM | POA: Diagnosis not present

## 2016-08-02 DIAGNOSIS — L02211 Cutaneous abscess of abdominal wall: Secondary | ICD-10-CM | POA: Diagnosis not present

## 2016-08-02 DIAGNOSIS — Z438 Encounter for attention to other artificial openings: Secondary | ICD-10-CM | POA: Diagnosis not present

## 2016-08-04 ENCOUNTER — Other Ambulatory Visit: Payer: Self-pay | Admitting: Cardiovascular Disease

## 2016-08-06 NOTE — Telephone Encounter (Signed)
REFILL 

## 2016-08-06 NOTE — Telephone Encounter (Signed)
Review for refill. 

## 2016-08-07 ENCOUNTER — Ambulatory Visit
Admission: RE | Admit: 2016-08-07 | Discharge: 2016-08-07 | Disposition: A | Discharge status: Home / Self Care | Payer: Medicare Other | Source: Ambulatory Visit | Attending: General Surgery | Admitting: General Surgery

## 2016-08-07 DIAGNOSIS — L02211 Cutaneous abscess of abdominal wall: Secondary | ICD-10-CM

## 2016-08-07 HISTORY — PX: IR GENERIC HISTORICAL: IMG1180011

## 2016-08-07 MED ORDER — IOPAMIDOL (ISOVUE-300) INJECTION 61%
125.0000 mL | Freq: Once | INTRAVENOUS | Status: AC | PRN
  Administered 2016-08-07: 125 mL via INTRAVENOUS

## 2016-08-07 NOTE — Progress Notes (Signed)
wosobrPatient ID: Suzanne Macdonald, female   DOB: 11-06-1950, 65 y.o.   MRN: [id number]   Referring Physician(s): [place], Marland Kitchen  Chief Complaint: The patient is seen in follow up today s/p abdominal wall abscess drain placement on 06-11-16 by Dr. Markus Daft  History of present illness:  This is a pleasant 65 yo female who is s/p ventral abdominal wall hernia repair who had a SBR as well.  She was admitted post op for an abdominal wall fluid collection with a concern for a fistula to her small bowel next to her anastomosis.  She had a drain placed on 06-11-16.  She has had this drain since that time.  She had a repeat CT san on 06-28-16 which revealed a resolution of her abscess.  She was supposed to be scheduled for a drain injection, but due to other hospitalizations for A. Fib, this has not been done.  She presents today for a CT scan and drain injection.  She denies any pain, fevers, or other issues.  She is flushing her drain with 5cc of NS daily.  She is only having 5cc or less of output a day.  Past Medical History:  Diagnosis Date  . Asthma   . Atrial fibrillation ([place])   . Cervical cancer ([place]) 1972  . Colon cancer ([place])   . H/O gastric bypass   . Hyperlipidemia   . Hypertension   . Morbid obesity ([place]) 03/04/2016  . Sleep apnea     Past Surgical History:  Procedure Laterality Date  . ABDOMINAL HYSTERECTOMY  1998   Salpingoophorectomy. For tumorous growth.  . BOWEL RESECTION  2015   [place], [place] by Dr. Stormy Fabian  . CARDIAC CATHETERIZATION  05/2014   in [place], no sig CAD (done prior to surgery for colon CA)  . GASTRIC BYPASS  1992  . INSERTION OF MESH N/A 05/01/2016   Procedure: INSERTION OF MESH;  Surgeon: Excell Seltzer, MD;  Location: [facility];  Service: General;  Laterality: N/A;  . IR GENERIC HISTORICAL  06/28/2016   IR RADIOLOGIST EVAL & MGMT 06/28/2016 Markus Daft, MD [facility] INTERV RAD  . LAPAROSCOPIC GASTRIC BANDING     Placed 2013 and removed in 2014.  Marland Kitchen  VENTRAL HERNIA REPAIR N/A 05/01/2016   Procedure: VENTRAL HERNIA REPAIR;  Surgeon: Excell Seltzer, MD;  Location: [facility];  Service: General;  Laterality: N/A;    Allergies: Augmentin [amoxicillin-pot clavulanate]; Scallops [shellfish allergy]; and Barium-containing compounds  Medications: Prior to Admission medications   Medication Sig Start Date End Date Taking? Authorizing Provider  acetaminophen (TYLENOL) 500 MG tablet Take 1,000 mg by mouth every 6 (six) hours as needed for mild pain or moderate pain.    Historical Provider, MD  albuterol (PROVENTIL HFA;VENTOLIN HFA) 108 (90 Base) MCG/ACT inhaler Inhale 1-2 puffs into the lungs every 6 (six) hours as needed for wheezing or shortness of breath.    Historical Provider, MD  apixaban (ELIQUIS) 5 MG TABS tablet Take 1 tablet (5 mg total) by mouth 2 (two) times daily. 03/13/16   Shela Leff, MD  atorvastatin (LIPITOR) 10 MG tablet take 1 tablet by mouth once daily 04/04/16   Shela Leff, MD  budesonide-formoterol [facility]) 160-4.5 MCG/ACT inhaler Inhale 2 puffs into the lungs 2 (two) times daily. 01/24/16   Milagros Loll, MD  Calcium-Phosphorus-Vitamin D (CALCIUM/VITAMIN D3/ADULT GUMMY PO) Take 2 each by mouth daily.    Historical Provider, MD  carvedilol (COREG) 12.5 MG tablet Take 1 tablet (12.5 mg total) by mouth 2 (  two) times daily with a meal. 07/23/16   Rhonda G Barrett, PA-C  Cholecalciferol (VITAMIN D3) 2000 units CHEW Chew 2,000 Units by mouth 2 (two) times daily.    Historical Provider, MD  cyanocobalamin 500 MCG tablet Take 1,000 mcg by mouth daily.     Historical Provider, MD  diltiazem (CARDIZEM CD) 120 MG 24 hr capsule Take 1 capsule (120 mg total) by mouth daily. 07/13/16   Alphonzo Grieve, MD  diltiazem (CARDIZEM) 30 MG tablet Take 1 tablet every 4 hours AS NEEDED for AFIB fast heart rate >100 as long as blood pressure >100. 07/30/16   Sherran Needs, NP  docusate sodium (COLACE) 100 MG capsule Take 1 capsule (100 mg  total) by mouth 2 (two) times daily as needed for mild constipation. 06/14/16   Ledell Noss, MD  flecainide (TAMBOCOR) 100 MG tablet take 1 tablet by mouth twice a day 08/06/16   [name], MD  lisinopril (PRINIVIL,ZESTRIL) 5 MG tablet take 1 tablet by mouth once daily 07/27/16   [name], MD  loperamide (IMODIUM) 2 MG capsule Take 2 mg by mouth daily as needed for diarrhea or loose stools.    Historical Provider, MD  Multiple Vitamins-Minerals (ALIVE WOMENS GUMMY) CHEW Chew 2 each by mouth daily.    Historical Provider, MD  omeprazole (PRILOSEC) 40 MG capsule Take 1 capsule (40 mg total) by mouth daily. 04/27/16   [name], MD  potassium chloride (K-DUR) 10 MEQ tablet 2 TABLETS BY MOUTH DAILY 06/27/16   [name], MD  prochlorperazine (COMPAZINE) 10 MG tablet Take 10 mg by mouth every 6 (six) hours as needed for nausea or vomiting.    Historical Provider, MD  traZODone (DESYREL) 100 MG tablet Take 100 mg by mouth at bedtime. 04/22/16   Historical Provider, MD     Family History  Problem Relation Age of Onset  . Heart disease Mother   . Diabetes Mother   . Heart disease Father   . Stroke Father   . Diabetes Father   . Diabetes Sister   . Thyroid disease Sister   . Early death Brother     Killed by drunk driver  . Heart attack Brother   . Thyroid disease Daughter     s/p thyroidectomy  . Diabetes Sister   . Thyroid disease Sister   . Stroke Brother   . Drug abuse Brother   . Heart attack Brother     Social History   Social History  . Marital status: Married    Spouse name: N/A  . Number of children: N/A  . Years of education: N/A   Social History Main Topics  . Smoking status: Former Smoker    Years: 0.20    Types: Cigarettes  . Smokeless tobacco: Never Used  . Alcohol use No     Comment: a beer every now and then  . Drug use: No  . Sexual activity: No   Other Topics Concern  . Not on file   Social History Narrative  . No narrative on file      Vital Signs: BP (!) 164/70 (BP Location: Right Arm)   Temp 98.3 F (36.8 C)   Physical Exam Gen: NAD Abd: soft, NT, Nd, drain with no current output.  Drain was flushed and no fistula was noted.  Drain was removed in its entirety with no issues. Imaging: No results found.  Labs:  CBC:  Recent Labs  06/14/16 0450 07/11/16 2131 07/12/16 0457 07/30/16 0850  WBC 8.6 8.2 7.7 5.9  HGB 8.9* 11.1* 9.7* 10.0*  HCT 30.0* 36.9 32.5* 33.3*  PLT 326 319 298 249    COAGS:  Recent Labs  06/11/16 0906 07/12/16 0457  INR 1.28 1.23    BMP:  Recent Labs  06/12/16 0415 07/11/16 2131 07/12/16 0457 07/30/16 0850  NA 138 141 141 142  K 3.5 3.3* 3.2* 3.9  CL 106 108 110 108  CO2 26 21* 24 27  GLUCOSE 110* 116* 113* 102*  BUN '11 10 10 14  '$ CALCIUM 7.7* 9.1 8.7* 9.0  CREATININE 0.82 0.89 0.79 0.76  GFRNONAA >60 >60 >60 >60  GFRAA >60 >60 >60 >60    LIVER FUNCTION TESTS:  Recent Labs  01/24/16 0952 04/28/16 1439 06/09/16 1928 07/12/16 0457  BILITOT 0.3 0.5 1.1 0.5  AST 35 43* 19 31  ALT 30 46 11* 18  ALKPHOS 94 67 72 60  PROT 6.3 5.9* 6.0* 5.5*  ALBUMIN 3.9 3.5 2.9* 3.1*    Assessment:  1. Subcutaneous abdominal wall abscess, s/p drain placement on 06-11-16  The patient is doing well.  Her drain injection today shows no evidence of fistula.  Her abscess cavity has collapsed and because this showed resolution, her drain was removed.  A dry dressing was placed.  She does not need any further follow up with [place].  I have encouraged her to follow up with Dr. Excell Seltzer as instructed by him or his office.  Signed: Nadja Lina E 08/07/2016, 10:03 AM   Please refer to Dr. Katrinka Blazing attestation of this note for management and plan.

## 2016-08-09 ENCOUNTER — Encounter (HOSPITAL_COMMUNITY): Payer: Self-pay

## 2016-08-09 ENCOUNTER — Emergency Department (HOSPITAL_COMMUNITY)
Admission: EM | Admit: 2016-08-09 | Discharge: 2016-08-10 | Disposition: A | Discharge status: Home / Self Care | Payer: Medicare Other | Source: Home / Self Care | Attending: Emergency Medicine | Admitting: Emergency Medicine

## 2016-08-09 DIAGNOSIS — L03311 Cellulitis of abdominal wall: Secondary | ICD-10-CM | POA: Diagnosis not present

## 2016-08-09 DIAGNOSIS — Z85038 Personal history of other malignant neoplasm of large intestine: Secondary | ICD-10-CM

## 2016-08-09 DIAGNOSIS — L0291 Cutaneous abscess, unspecified: Secondary | ICD-10-CM

## 2016-08-09 DIAGNOSIS — Z7901 Long term (current) use of anticoagulants: Secondary | ICD-10-CM | POA: Insufficient documentation

## 2016-08-09 DIAGNOSIS — Z8541 Personal history of malignant neoplasm of cervix uteri: Secondary | ICD-10-CM

## 2016-08-09 DIAGNOSIS — I480 Paroxysmal atrial fibrillation: Secondary | ICD-10-CM | POA: Diagnosis not present

## 2016-08-09 DIAGNOSIS — L02211 Cutaneous abscess of abdominal wall: Secondary | ICD-10-CM | POA: Diagnosis not present

## 2016-08-09 DIAGNOSIS — J45909 Unspecified asthma, uncomplicated: Secondary | ICD-10-CM | POA: Diagnosis not present

## 2016-08-09 DIAGNOSIS — R05 Cough: Secondary | ICD-10-CM | POA: Diagnosis not present

## 2016-08-09 DIAGNOSIS — Z87891 Personal history of nicotine dependence: Secondary | ICD-10-CM | POA: Insufficient documentation

## 2016-08-09 DIAGNOSIS — I700 Atherosclerosis of aorta: Secondary | ICD-10-CM | POA: Diagnosis not present

## 2016-08-09 DIAGNOSIS — I10 Essential (primary) hypertension: Secondary | ICD-10-CM

## 2016-08-09 DIAGNOSIS — Z79899 Other long term (current) drug therapy: Secondary | ICD-10-CM

## 2016-08-09 DIAGNOSIS — Z452 Encounter for adjustment and management of vascular access device: Secondary | ICD-10-CM | POA: Diagnosis not present

## 2016-08-09 DIAGNOSIS — Z6841 Body Mass Index (BMI) 40.0 and over, adult: Secondary | ICD-10-CM | POA: Diagnosis not present

## 2016-08-09 DIAGNOSIS — R109 Unspecified abdominal pain: Secondary | ICD-10-CM | POA: Diagnosis not present

## 2016-08-09 DIAGNOSIS — A419 Sepsis, unspecified organism: Secondary | ICD-10-CM | POA: Diagnosis not present

## 2016-08-09 DIAGNOSIS — Z438 Encounter for attention to other artificial openings: Secondary | ICD-10-CM | POA: Diagnosis not present

## 2016-08-09 LAB — I-STAT CG4 LACTIC ACID, ED: Lactic Acid, Venous: 1.41 mmol/L (ref 0.5–1.9)

## 2016-08-09 NOTE — ED Triage Notes (Signed)
Pt states that she had recent surgery of hernia repair, with abscess formation, JP drain removed two days ago, today abd pain began, denies n/v/d , states small fever today.. Redness and warmth around area.

## 2016-08-10 ENCOUNTER — Emergency Department (HOSPITAL_COMMUNITY): Payer: Medicare Other

## 2016-08-10 DIAGNOSIS — R109 Unspecified abdominal pain: Secondary | ICD-10-CM | POA: Diagnosis not present

## 2016-08-10 LAB — CBC WITH DIFFERENTIAL/PLATELET
BASOS ABS: 0 10*3/uL (ref 0.0–0.1)
Basophils Relative: 0 %
EOS ABS: 0.1 10*3/uL (ref 0.0–0.7)
Eosinophils Relative: 1 %
HEMATOCRIT: 32.8 % — AB (ref 36.0–46.0)
HEMOGLOBIN: 9.9 g/dL — AB (ref 12.0–15.0)
LYMPHS PCT: 26 %
Lymphs Abs: 2.3 10*3/uL (ref 0.7–4.0)
MCH: 21 pg — AB (ref 26.0–34.0)
MCHC: 30.2 g/dL (ref 30.0–36.0)
MCV: 69.5 fL — ABNORMAL LOW (ref 78.0–100.0)
MONOS PCT: 10 %
Monocytes Absolute: 0.9 10*3/uL (ref 0.1–1.0)
NEUTROS ABS: 5.7 10*3/uL (ref 1.7–7.7)
NEUTROS PCT: 63 %
Platelets: 252 10*3/uL (ref 150–400)
RBC: 4.72 MIL/uL (ref 3.87–5.11)
RDW: 19.3 % — ABNORMAL HIGH (ref 11.5–15.5)
WBC: 9 10*3/uL (ref 4.0–10.5)

## 2016-08-10 LAB — BASIC METABOLIC PANEL
ANION GAP: 9 (ref 5–15)
BUN: 13 mg/dL (ref 6–20)
CALCIUM: 8.9 mg/dL (ref 8.9–10.3)
CO2: 24 mmol/L (ref 22–32)
Chloride: 105 mmol/L (ref 101–111)
Creatinine, Ser: 0.79 mg/dL (ref 0.44–1.00)
GFR calc Af Amer: >60 mL/min (ref >60)
GFR calc non Af Amer: >60 mL/min (ref >60)
GLUCOSE: 139 mg/dL — AB (ref 65–99)
Potassium: 4.2 mmol/L (ref 3.5–5.1)
Sodium: 138 mmol/L (ref 135–145)

## 2016-08-10 LAB — I-STAT CG4 LACTIC ACID, ED: LACTIC ACID, VENOUS: 1.31 mmol/L (ref 0.5–1.9)

## 2016-08-10 LAB — HEPATIC FUNCTION PANEL
ALBUMIN: 3.4 g/dL — AB (ref 3.5–5.0)
ALK PHOS: 81 U/L (ref 38–126)
ALT: 24 U/L (ref 14–54)
AST: 25 U/L (ref 15–41)
BILIRUBIN TOTAL: 0.3 mg/dL (ref 0.3–1.2)
Bilirubin, Direct: 0.1 mg/dL (ref 0.1–0.5)
Indirect Bilirubin: 0.2 mg/dL — ABNORMAL LOW (ref 0.3–0.9)
Total Protein: 6.2 g/dL — ABNORMAL LOW (ref 6.5–8.1)

## 2016-08-10 LAB — LIPASE, BLOOD: Lipase: 21 U/L (ref 11–51)

## 2016-08-10 MED ORDER — IOPAMIDOL (ISOVUE-300) INJECTION 61%
INTRAVENOUS | Status: AC
  Administered 2016-08-10: 100 mL
  Filled 2016-08-10: qty 100

## 2016-08-10 MED ORDER — HYDROCODONE-ACETAMINOPHEN 5-325 MG PO TABS: 1.0000 | ORAL_TABLET | ORAL | 0 refills | Status: DC | PRN

## 2016-08-10 MED ORDER — MORPHINE SULFATE (PF) 4 MG/ML IV SOLN
4.0000 mg | Freq: Once | INTRAVENOUS | Status: AC
  Administered 2016-08-10: 4 mg via INTRAVENOUS
  Filled 2016-08-10: qty 1

## 2016-08-10 MED ORDER — DOXYCYCLINE HYCLATE 100 MG PO TABS
100.0000 mg | ORAL_TABLET | Freq: Once | ORAL | Status: AC
  Administered 2016-08-10: 100 mg via ORAL
  Filled 2016-08-10: qty 1

## 2016-08-10 MED ORDER — IOPAMIDOL (ISOVUE-300) INJECTION 61%
INTRAVENOUS | Status: AC
  Filled 2016-08-10: qty 30

## 2016-08-10 MED ORDER — ONDANSETRON HCL 4 MG/2ML IJ SOLN
4.0000 mg | Freq: Once | INTRAMUSCULAR | Status: AC
  Administered 2016-08-10: 4 mg via INTRAVENOUS
  Filled 2016-08-10: qty 2

## 2016-08-10 MED ORDER — DOXYCYCLINE HYCLATE 100 MG PO CAPS: 100.0000 mg | ORAL_CAPSULE | Freq: Two times a day (BID) | ORAL | 0 refills | Status: DC

## 2016-08-10 NOTE — ED Provider Notes (Signed)
[facility] DEPT Provider Note   CSN: [id number] Arrival date & time: 08/09/16  2300  History   Chief Complaint Chief Complaint  Patient presents with  . Post-op Problem    HPI Suzanne Macdonald is a 65 y.o. female.  HPI   Pt with PMH of asthma, a.fib, cervical, colon cancer, h/o gastric bypass, hyperlipidemia, hypertension, morbid obesity, sleep apnea, a. Flutter, a.fibrillation, fever, incarcerated hernia, abdominal abscess post op from hernia repair, hx of gastric bypass surgery. Patient had drained placed in September and had it removed 2 days ago, they removed it - per patient, because she felt like it was causing her pain. She was doing relatively well on the first day and then yesterday developed severe pain, today she has developed redness around the site of the drain wound. She is tender to the area but says that it is only a little bit worse than normal. She endorses low grade fever. No dysuria, no diarrhea, vomiting, constipation, CP, SOB, weakness, fatigue or headache.  Past Medical History:  Diagnosis Date  . Asthma   . Atrial fibrillation ([place])   . Cervical cancer ([place]) 1972  . Colon cancer ([place])   . H/O gastric bypass   . Hyperlipidemia   . Hypertension   . Morbid obesity ([place]) 03/04/2016  . Sleep apnea     Patient Active Problem List   Diagnosis Date Noted  . Atrial flutter ([place]) 07/12/2016  . Atrial fibrillation with RVR ([place]) 07/11/2016  . Fever   . At risk for surgical site infection   . Gram-negative infection   . Abdominal wall abscess 06/10/2016  . Aortic atherosclerosis ([place]) 05/09/2016  . Morbid obesity ([place]) 05/04/2016  . Incarcerated hernia 04/28/2016  . Osteopenia 03/15/2016  . Atrial fibrillation with tachycardic ventricular rate ([place])   . Asthma 03/02/2016  . Prediabetes 01/25/2016  . Essential hypertension 01/25/2016  . Vitamin D deficiency 01/25/2016  . 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 Vitamin B12 deficiency 01/25/2016  .  Paroxysmal atrial fibrillation ([place]) 01/25/2016  . Health care maintenance 01/25/2016  . History of colon cancer 01/25/2016  . Ventral hernia 01/25/2016  . History of cervical cancer 01/25/2016    Past Surgical History:  Procedure Laterality Date  . ABDOMINAL HYSTERECTOMY  1998   Salpingoophorectomy. For tumorous growth.  . BOWEL RESECTION  2015   [place], [place] by Dr. Stormy Fabian  . CARDIAC CATHETERIZATION  05/2014   in [place], no sig CAD (done prior to surgery for colon CA)  . GASTRIC BYPASS  1992  . INSERTION OF MESH N/A 05/01/2016   Procedure: INSERTION OF MESH;  Surgeon: Excell Seltzer, MD;  Location: [facility];  Service: General;  Laterality: N/A;  . IR GENERIC HISTORICAL  06/28/2016   IR RADIOLOGIST EVAL & MGMT 06/28/2016 Markus Daft, MD [facility] INTERV RAD  . LAPAROSCOPIC GASTRIC BANDING     Placed 2013 and removed in 2014.  Marland Kitchen VENTRAL HERNIA REPAIR N/A 05/01/2016   Procedure: VENTRAL HERNIA REPAIR;  Surgeon: Excell Seltzer, MD;  Location: [facility];  Service: General;  Laterality: N/A;    OB History    No data available       Home Medications    Prior to Admission medications   Medication Sig Start Date End Date Taking? Authorizing Provider  acetaminophen (TYLENOL) 500 MG tablet Take 1,000 mg by mouth every 6 (six) hours as needed for mild pain or moderate pain.   Yes Historical Provider, MD  albuterol (PROVENTIL HFA;VENTOLIN HFA) 108 (90 Base)  MCG/ACT inhaler Inhale 1-2 puffs into the lungs every 6 (six) hours as needed for wheezing or shortness of breath.   Yes Historical Provider, MD  apixaban (ELIQUIS) 5 MG TABS tablet Take 1 tablet (5 mg total) by mouth 2 (two) times daily. 03/13/16  Yes Shela Leff, MD  atorvastatin (LIPITOR) 10 MG tablet take 1 tablet by mouth once daily 04/04/16  Yes Shela Leff, MD  budesonide-formoterol [facility]) 160-4.5 MCG/ACT inhaler Inhale 2 puffs into the lungs 2 (two) times daily. 01/24/16  Yes Milagros Loll, MD    Calcium-Phosphorus-Vitamin D (CALCIUM/VITAMIN D3/ADULT GUMMY PO) Take 2 each by mouth daily.   Yes Historical Provider, MD  carvedilol (COREG) 12.5 MG tablet Take 1 tablet (12.5 mg total) by mouth 2 (two) times daily with a meal. 07/23/16  Yes Rhonda G Barrett, PA-C  Cholecalciferol (VITAMIN D3) 2000 units CHEW Chew 2,000 Units by mouth 2 (two) times daily.   Yes Historical Provider, MD  cyanocobalamin 500 MCG tablet Take 1,000 mcg by mouth daily.    Yes Historical Provider, MD  diltiazem (CARDIZEM CD) 120 MG 24 hr capsule Take 1 capsule (120 mg total) by mouth daily. 07/13/16  Yes Alphonzo Grieve, MD  diltiazem (CARDIZEM) 30 MG tablet Take 1 tablet every 4 hours AS NEEDED for AFIB fast heart rate >100 as long as blood pressure >100. 07/30/16  Yes Sherran Needs, NP  docusate sodium (COLACE) 100 MG capsule Take 1 capsule (100 mg total) by mouth 2 (two) times daily as needed for mild constipation. 06/14/16  Yes Ledell Noss, MD  flecainide (TAMBOCOR) 100 MG tablet take 1 tablet by mouth twice a day 08/06/16  Yes [name], MD  lisinopril (PRINIVIL,ZESTRIL) 5 MG tablet take 1 tablet by mouth once daily 07/27/16  Yes [name], MD  loperamide (IMODIUM) 2 MG capsule Take 2 mg by mouth daily as needed for diarrhea or loose stools.   Yes Historical Provider, MD  Multiple Vitamins-Minerals (ALIVE WOMENS GUMMY) CHEW Chew 2 each by mouth daily.   Yes Historical Provider, MD  omeprazole (PRILOSEC) 40 MG capsule Take 1 capsule (40 mg total) by mouth daily. 04/27/16  Yes [name], MD  potassium chloride (K-DUR) 10 MEQ tablet 2 TABLETS BY MOUTH DAILY 06/27/16  Yes [name], MD  prochlorperazine (COMPAZINE) 10 MG tablet Take 10 mg by mouth every 6 (six) hours as needed for nausea or vomiting.   Yes Historical Provider, MD  traZODone (DESYREL) 100 MG tablet Take 100 mg by mouth at bedtime. 04/22/16  Yes Historical Provider, MD  doxycycline (VIBRAMYCIN) 100 MG capsule Take 1 capsule (100 mg  total) by mouth 2 (two) times daily. 08/10/16   Delos Haring, PA-C  HYDROcodone-acetaminophen (NORCO/VICODIN) 5-325 MG tablet Take 1-2 tablets by mouth every 4 (four) hours as needed. 08/10/16   Delos Haring, PA-C    Family History Family History  Problem Relation Age of Onset  . Heart disease Mother   . Diabetes Mother   . Heart disease Father   . Stroke Father   . Diabetes Father   . Diabetes Sister   . Thyroid disease Sister   . Early death Brother     Killed by drunk driver  . Heart attack Brother   . Thyroid disease Daughter     s/p thyroidectomy  . Diabetes Sister   . Thyroid disease Sister   . Stroke Brother   . Drug abuse Brother   . Heart attack Brother     Social History Social  History  Substance Use Topics  . Smoking status: Former Smoker    Years: 0.20    Types: Cigarettes  . Smokeless tobacco: Never Used  . Alcohol use No     Comment: a beer every now and then     Allergies   Augmentin [amoxicillin-pot clavulanate]; Scallops [shellfish allergy]; and Barium-containing compounds   Review of Systems Review of Systems  Review of Systems All other systems negative except as documented in the HPI. All pertinent positives and negatives as reviewed in the HPI.  Physical Exam Updated Vital Signs BP (!) 115/54   Pulse (!) 59   Temp 98.9 F (37.2 C) (Oral)   Resp 18   SpO2 99%   Physical Exam  Constitutional: She appears well-developed and well-nourished. No distress.  HENT:  Head: Normocephalic and atraumatic.  Right Ear: Tympanic membrane and ear canal normal.  Left Ear: Tympanic membrane and ear canal normal.  Nose: Nose normal.  Mouth/Throat: Uvula is midline, oropharynx is clear and moist and mucous membranes are normal.  Eyes: Pupils are equal, round, and reactive to light.  Neck: Normal range of motion. Neck supple.  Cardiovascular: Normal rate and regular rhythm.   Pulmonary/Chest: Effort normal.  Abdominal: Soft.  Obese abdomen.  Scars to abdominal wall Area of cellulitis around drain site, no puss or fluid. approx 5 cm in diameter and sensitive to the touch.  Pt has tenderness to the wall of the abdomen in the right lower quadrant around drain site.  Musculoskeletal:  No LE swelling  Neurological: She is alert.  Acting at baseline  Skin: Skin is warm and dry. No rash noted.  Nursing note and vitals reviewed.    ED Treatments / Results  Labs (all labs ordered are listed, but only abnormal results are displayed) Labs Reviewed  CBC WITH DIFFERENTIAL/PLATELET - Abnormal; Notable for the following:       Result Value   Hemoglobin 9.9 (*)    HCT 32.8 (*)    MCV 69.5 (*)    MCH 21.0 (*)    RDW 19.3 (*)    All other components within normal limits  BASIC METABOLIC PANEL - Abnormal; Notable for the following:    Glucose, Bld 139 (*)    All other components within normal limits  HEPATIC FUNCTION PANEL - Abnormal; Notable for the following:    Total Protein 6.2 (*)    Albumin 3.4 (*)    Indirect Bilirubin 0.2 (*)    All other components within normal limits  LIPASE, BLOOD  I-STAT CG4 LACTIC ACID, ED  I-STAT CG4 LACTIC ACID, ED    EKG  EKG Interpretation None       Radiology Ct Abdomen Pelvis W Contrast  Result Date: 08/10/2016 CLINICAL DATA:  Abdominal pain today.  Febrile. EXAM: CT ABDOMEN AND PELVIS WITH CONTRAST TECHNIQUE: Multidetector CT imaging of the abdomen and pelvis was performed using the standard protocol following bolus administration of intravenous contrast. CONTRAST:  147m ISOVUE-300 IOPAMIDOL (ISOVUE-300) INJECTION 61% COMPARISON:  08/07/2016, 06/28/2016, 06/11/2016. FINDINGS: Lower chest: No acute abnormality. Hepatobiliary: No focal liver abnormality is seen. No gallstones, gallbladder wall thickening, or biliary dilatation. Pancreas: Unremarkable. No pancreatic ductal dilatation or surrounding inflammatory changes. Spleen: Normal in size without focal abnormality. Adrenals/Urinary  Tract: Adrenal glands are unremarkable. Kidneys are normal, without renal calculi, focal lesion, or hydronephrosis. Bladder is unremarkable. Stomach/Bowel: Stomach is within normal limits. Small bowel anastomosis in the right lower quadrant is unremarkable except that it may be adherent to  the undersurface of the anterior abdominal wall. This is unchanged. Appendix is normal. No evidence of colonic wall thickening or inflammation. Vascular/Lymphatic: The abdominal aorta is normal in caliber with mild atherosclerotic calcification. No adenopathy. Reproductive: Status post hysterectomy. No adnexal masses. Other: There is a 2.4 cm recurrent collection in the right lateral subcutaneous tissues, much smaller than the original collection. Mild surrounding stranding opacity extending up the skin surface. There is unchanged fluid collection along the anterior abdominal wall related to prior ventral hernia repair. The thickness of the collection measures up to 1.7 cm and the collection covers a large portion of the anterior abdomen. No gas within this collection. Musculoskeletal: No significant skeletal lesion. 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 IMPRESSION: 1. Recurrent air and fluid collection in the right lateral subcutaneous tissues, much smaller than the original collection, only 2.4 cm at this time. There also is adjacent cellulitic-appearing stranding opacity extending out to the skin. 2. Persistent anterior abdominal wall fluid collection associated with prior ventral hernia repair although it is slowly decreasing in thickness. No air within this collection. 3. Small bowel anastomosis in the right lower quadrant may be adherent to the undersurface of the anterior abdominal wall. No fistula is evident. No bowel obstruction. Electronically Signed   By: Andreas Newport M.D.   On: 08/10/2016 02:52    Procedures Procedures (including critical care time)  Medications Ordered in ED Medications    iopamidol (ISOVUE-300) 61 % injection (not administered)  doxycycline (VIBRA-TABS) tablet 100 mg (not administered)  morphine 4 MG/ML injection 4 mg (4 mg Intravenous Given 08/10/16 0029)  ondansetron (ZOFRAN) injection 4 mg (4 mg Intravenous Given 08/10/16 0028)  iopamidol (ISOVUE-300) 61 % injection (100 mLs  Contrast Given 08/10/16 0225)  morphine 4 MG/ML injection 4 mg (4 mg Intravenous Given 08/10/16 0145)     Initial Impression / Assessment and Plan / ED Course  I have reviewed the triage vital signs and the nursing notes.  Pertinent labs & imaging results that were available during my care of the patient were reviewed by me and considered in my medical decision making (see chart for details).  Clinical Course     CT abd shows a small 2 cm area of fluid collection and a small area of cellulitis to the skin Dr. Betsey Holiday has seen the patient as well, we will start her on Doxycycline and refer the patient back to her surgeon. She is afebrile in the ED and her blood work is unremarkable at this time. We discussed return precautions.  Final Clinical Impressions(s) / ED Diagnoses   Final diagnoses:  Abscess  Cellulitis of abdominal wall    New Prescriptions New Prescriptions   DOXYCYCLINE (VIBRAMYCIN) 100 MG CAPSULE    Take 1 capsule (100 mg total) by mouth 2 (two) times daily.   HYDROCODONE-ACETAMINOPHEN (NORCO/VICODIN) 5-325 MG TABLET    Take 1-2 tablets by mouth every 4 (four) hours as needed.     Delos Haring, PA-C 08/10/16 0321    Orpah Greek, MD 08/14/16 [phone]

## 2016-08-10 NOTE — ED Notes (Signed)
Pt given and drinking contrast

## 2016-08-11 ENCOUNTER — Encounter (HOSPITAL_COMMUNITY): Payer: Self-pay | Admitting: Emergency Medicine

## 2016-08-11 ENCOUNTER — Inpatient Hospital Stay (HOSPITAL_COMMUNITY)
Admission: EM | Admit: 2016-08-11 | Discharge: 2016-08-13 | DRG: 603 | Disposition: A | Discharge status: Home / Self Care | Payer: Medicare Other | Attending: Internal Medicine | Admitting: Internal Medicine

## 2016-08-11 ENCOUNTER — Emergency Department (HOSPITAL_COMMUNITY): Payer: Medicare Other

## 2016-08-11 DIAGNOSIS — G4733 Obstructive sleep apnea (adult) (pediatric): Secondary | ICD-10-CM | POA: Diagnosis present

## 2016-08-11 DIAGNOSIS — I480 Paroxysmal atrial fibrillation: Secondary | ICD-10-CM | POA: Diagnosis present

## 2016-08-11 DIAGNOSIS — Z9884 Bariatric surgery status: Secondary | ICD-10-CM | POA: Diagnosis not present

## 2016-08-11 DIAGNOSIS — I10 Essential (primary) hypertension: Secondary | ICD-10-CM | POA: Diagnosis present

## 2016-08-11 DIAGNOSIS — Z823 Family history of stroke: Secondary | ICD-10-CM

## 2016-08-11 DIAGNOSIS — Z87891 Personal history of nicotine dependence: Secondary | ICD-10-CM

## 2016-08-11 DIAGNOSIS — R05 Cough: Secondary | ICD-10-CM | POA: Diagnosis not present

## 2016-08-11 DIAGNOSIS — E785 Hyperlipidemia, unspecified: Secondary | ICD-10-CM | POA: Diagnosis not present

## 2016-08-11 DIAGNOSIS — Z833 Family history of diabetes mellitus: Secondary | ICD-10-CM

## 2016-08-11 DIAGNOSIS — Z8541 Personal history of malignant neoplasm of cervix uteri: Secondary | ICD-10-CM | POA: Diagnosis not present

## 2016-08-11 DIAGNOSIS — Y838 Other surgical procedures as the cause of abnormal reaction of the patient, or of later complication, without mention of misadventure at the time of the procedure: Secondary | ICD-10-CM | POA: Diagnosis not present

## 2016-08-11 DIAGNOSIS — Z8249 Family history of ischemic heart disease and other diseases of the circulatory system: Secondary | ICD-10-CM

## 2016-08-11 DIAGNOSIS — K219 Gastro-esophageal reflux disease without esophagitis: Secondary | ICD-10-CM | POA: Diagnosis present

## 2016-08-11 DIAGNOSIS — J45909 Unspecified asthma, uncomplicated: Secondary | ICD-10-CM | POA: Diagnosis present

## 2016-08-11 DIAGNOSIS — L03311 Cellulitis of abdominal wall: Secondary | ICD-10-CM

## 2016-08-11 DIAGNOSIS — Z90710 Acquired absence of both cervix and uterus: Secondary | ICD-10-CM | POA: Diagnosis not present

## 2016-08-11 DIAGNOSIS — G4700 Insomnia, unspecified: Secondary | ICD-10-CM | POA: Diagnosis present

## 2016-08-11 DIAGNOSIS — A419 Sepsis, unspecified organism: Secondary | ICD-10-CM

## 2016-08-11 DIAGNOSIS — Z85038 Personal history of other malignant neoplasm of large intestine: Secondary | ICD-10-CM | POA: Diagnosis not present

## 2016-08-11 DIAGNOSIS — Z79899 Other long term (current) drug therapy: Secondary | ICD-10-CM | POA: Diagnosis not present

## 2016-08-11 DIAGNOSIS — Z6841 Body Mass Index (BMI) 40.0 and over, adult: Secondary | ICD-10-CM

## 2016-08-11 DIAGNOSIS — Z9049 Acquired absence of other specified parts of digestive tract: Secondary | ICD-10-CM

## 2016-08-11 DIAGNOSIS — B9620 Unspecified Escherichia coli [E. coli] as the cause of diseases classified elsewhere: Secondary | ICD-10-CM | POA: Diagnosis not present

## 2016-08-11 DIAGNOSIS — T814XXA Infection following a procedure, initial encounter: Secondary | ICD-10-CM | POA: Diagnosis not present

## 2016-08-11 LAB — COMPREHENSIVE METABOLIC PANEL
ALBUMIN: 3.2 g/dL — AB (ref 3.5–5.0)
ALT: 21 U/L (ref 14–54)
AST: 27 U/L (ref 15–41)
Alkaline Phosphatase: 65 U/L (ref 38–126)
Anion gap: 11 (ref 5–15)
BILIRUBIN TOTAL: 0.9 mg/dL (ref 0.3–1.2)
BUN: 14 mg/dL (ref 6–20)
CHLORIDE: 101 mmol/L (ref 101–111)
CO2: 20 mmol/L — ABNORMAL LOW (ref 22–32)
CREATININE: 0.94 mg/dL (ref 0.44–1.00)
Calcium: 8.7 mg/dL — ABNORMAL LOW (ref 8.9–10.3)
GFR calc Af Amer: >60 mL/min (ref >60)
GLUCOSE: 241 mg/dL — AB (ref 65–99)
Potassium: 3.8 mmol/L (ref 3.5–5.1)
Sodium: 132 mmol/L — ABNORMAL LOW (ref 135–145)
TOTAL PROTEIN: 6.5 g/dL (ref 6.5–8.1)

## 2016-08-11 LAB — URINALYSIS, ROUTINE W REFLEX MICROSCOPIC
Glucose, UA: NEGATIVE mg/dL
HGB URINE DIPSTICK: NEGATIVE
KETONES UR: 5 mg/dL — AB
LEUKOCYTES UA: NEGATIVE
NITRITE: NEGATIVE
PROTEIN: 30 mg/dL — AB
Specific Gravity, Urine: 1.033 — ABNORMAL HIGH (ref 1.005–1.030)
pH: 5 (ref 5.0–8.0)

## 2016-08-11 LAB — CBC WITH DIFFERENTIAL/PLATELET
BASOS PCT: 0 %
Basophils Absolute: 0 10*3/uL (ref 0.0–0.1)
EOS ABS: 0.1 10*3/uL (ref 0.0–0.7)
EOS PCT: 1 %
HCT: 32.5 % — ABNORMAL LOW (ref 36.0–46.0)
HEMOGLOBIN: 9.9 g/dL — AB (ref 12.0–15.0)
LYMPHS PCT: 14 %
Lymphs Abs: 1.7 10*3/uL (ref 0.7–4.0)
MCH: 20.9 pg — AB (ref 26.0–34.0)
MCHC: 30.5 g/dL (ref 30.0–36.0)
MCV: 68.6 fL — AB (ref 78.0–100.0)
Monocytes Absolute: 0.8 10*3/uL (ref 0.1–1.0)
Monocytes Relative: 7 %
NEUTROS ABS: 9.2 10*3/uL — AB (ref 1.7–7.7)
NEUTROS PCT: 78 %
Platelets: 246 10*3/uL (ref 150–400)
RBC: 4.74 MIL/uL (ref 3.87–5.11)
RDW: 19.4 % — ABNORMAL HIGH (ref 11.5–15.5)
WBC: 11.8 10*3/uL — ABNORMAL HIGH (ref 4.0–10.5)

## 2016-08-11 LAB — PROTIME-INR
INR: 1.15
Prothrombin Time: 14.7 seconds (ref 11.4–15.2)

## 2016-08-11 LAB — GLUCOSE, CAPILLARY: Glucose-Capillary: 160 mg/dL — ABNORMAL HIGH (ref 65–99)

## 2016-08-11 LAB — I-STAT CG4 LACTIC ACID, ED
LACTIC ACID, VENOUS: 2.91 mmol/L — AB (ref 0.5–1.9)
Lactic Acid, Venous: 1.53 mmol/L (ref 0.5–1.9)

## 2016-08-11 MED ORDER — PIPERACILLIN-TAZOBACTAM 3.375 G IVPB
3.3750 g | Freq: Three times a day (TID) | INTRAVENOUS | Status: DC
  Administered 2016-08-12 – 2016-08-13 (×4): 3.375 g via INTRAVENOUS
  Filled 2016-08-11 (×5): qty 50

## 2016-08-11 MED ORDER — VANCOMYCIN HCL IN DEXTROSE 1-5 GM/200ML-% IV SOLN: 1000.0000 mg | Freq: Once | INTRAVENOUS | Status: DC

## 2016-08-11 MED ORDER — ONDANSETRON HCL 4 MG/2ML IJ SOLN
4.0000 mg | Freq: Once | INTRAMUSCULAR | Status: AC
  Administered 2016-08-11: 4 mg via INTRAVENOUS
  Filled 2016-08-11: qty 2

## 2016-08-11 MED ORDER — SODIUM CHLORIDE 0.9 % IV BOLUS (SEPSIS)
2000.0000 mL | Freq: Once | INTRAVENOUS | Status: AC
  Administered 2016-08-11: 2000 mL via INTRAVENOUS

## 2016-08-11 MED ORDER — PANTOPRAZOLE SODIUM 40 MG PO TBEC
80.0000 mg | DELAYED_RELEASE_TABLET | Freq: Every day | ORAL | Status: DC
  Administered 2016-08-12: 80 mg via ORAL
  Filled 2016-08-11: qty 2

## 2016-08-11 MED ORDER — IBUPROFEN 400 MG PO TABS
800.0000 mg | ORAL_TABLET | Freq: Once | ORAL | Status: DC
  Filled 2016-08-11: qty 1

## 2016-08-11 MED ORDER — APIXABAN 5 MG PO TABS
5.0000 mg | ORAL_TABLET | Freq: Two times a day (BID) | ORAL | Status: DC
  Administered 2016-08-12 – 2016-08-13 (×3): 5 mg via ORAL
  Filled 2016-08-11 (×3): qty 1

## 2016-08-11 MED ORDER — TRAZODONE HCL 100 MG PO TABS
100.0000 mg | ORAL_TABLET | Freq: Every day | ORAL | Status: DC
  Administered 2016-08-12 (×2): 100 mg via ORAL
  Filled 2016-08-11 (×2): qty 1

## 2016-08-11 MED ORDER — ACETAMINOPHEN 500 MG PO TABS
1000.0000 mg | ORAL_TABLET | Freq: Once | ORAL | Status: AC
  Administered 2016-08-11: 1000 mg via ORAL
  Filled 2016-08-11: qty 2

## 2016-08-11 MED ORDER — ATORVASTATIN CALCIUM 10 MG PO TABS
10.0000 mg | ORAL_TABLET | Freq: Every day | ORAL | Status: DC
  Administered 2016-08-12 – 2016-08-13 (×2): 10 mg via ORAL
  Filled 2016-08-11 (×2): qty 1

## 2016-08-11 MED ORDER — VITAMIN D 1000 UNITS PO TABS
2000.0000 [IU] | ORAL_TABLET | Freq: Two times a day (BID) | ORAL | Status: DC
  Administered 2016-08-12 (×3): 2000 [IU] via ORAL
  Filled 2016-08-11 (×3): qty 2

## 2016-08-11 MED ORDER — ACETAMINOPHEN 325 MG PO TABS
650.0000 mg | ORAL_TABLET | Freq: Four times a day (QID) | ORAL | Status: DC | PRN
  Administered 2016-08-12: 650 mg via ORAL
  Filled 2016-08-11: qty 2

## 2016-08-11 MED ORDER — ALBUTEROL SULFATE (2.5 MG/3ML) 0.083% IN NEBU
5.0000 mg | INHALATION_SOLUTION | Freq: Once | RESPIRATORY_TRACT | Status: DC
  Filled 2016-08-11: qty 6

## 2016-08-11 MED ORDER — ACETAMINOPHEN 650 MG RE SUPP: 650.0000 mg | Freq: Four times a day (QID) | RECTAL | Status: DC | PRN

## 2016-08-11 MED ORDER — CARVEDILOL 12.5 MG PO TABS
12.5000 mg | ORAL_TABLET | Freq: Two times a day (BID) | ORAL | Status: DC
  Administered 2016-08-12 – 2016-08-13 (×3): 12.5 mg via ORAL
  Filled 2016-08-11 (×3): qty 1

## 2016-08-11 MED ORDER — DILTIAZEM HCL ER COATED BEADS 120 MG PO CP24
120.0000 mg | ORAL_CAPSULE | Freq: Every day | ORAL | Status: DC
  Administered 2016-08-12 – 2016-08-13 (×2): 120 mg via ORAL
  Filled 2016-08-11 (×2): qty 1

## 2016-08-11 MED ORDER — VANCOMYCIN HCL IN DEXTROSE 1-5 GM/200ML-% IV SOLN
1000.0000 mg | Freq: Two times a day (BID) | INTRAVENOUS | Status: DC
  Filled 2016-08-11: qty 200

## 2016-08-11 MED ORDER — PROCHLORPERAZINE MALEATE 10 MG PO TABS
10.0000 mg | ORAL_TABLET | Freq: Four times a day (QID) | ORAL | Status: DC | PRN
Start: 2016-08-11 — End: 2016-08-13

## 2016-08-11 MED ORDER — MOMETASONE FURO-FORMOTEROL FUM 200-5 MCG/ACT IN AERO
2.0000 | INHALATION_SPRAY | Freq: Two times a day (BID) | RESPIRATORY_TRACT | Status: DC
  Administered 2016-08-12 – 2016-08-13 (×2): 2 via RESPIRATORY_TRACT
  Filled 2016-08-11: qty 8.8

## 2016-08-11 MED ORDER — LOPERAMIDE HCL 2 MG PO CAPS: 2.0000 mg | ORAL_CAPSULE | Freq: Every day | ORAL | Status: DC | PRN

## 2016-08-11 MED ORDER — OXYCODONE HCL 5 MG PO TABS
5.0000 mg | ORAL_TABLET | Freq: Four times a day (QID) | ORAL | Status: DC | PRN
  Administered 2016-08-12: 5 mg via ORAL
  Filled 2016-08-11: qty 1

## 2016-08-11 MED ORDER — DOCUSATE SODIUM 100 MG PO CAPS
100.0000 mg | ORAL_CAPSULE | Freq: Two times a day (BID) | ORAL | Status: DC | PRN
  Administered 2016-08-12 – 2016-08-13 (×2): 100 mg via ORAL
  Filled 2016-08-11 (×2): qty 1

## 2016-08-11 MED ORDER — PIPERACILLIN-TAZOBACTAM 3.375 G IVPB
3.3750 g | Freq: Three times a day (TID) | INTRAVENOUS | Status: DC
  Administered 2016-08-11: 3.375 g via INTRAVENOUS
  Filled 2016-08-11: qty 50

## 2016-08-11 MED ORDER — CALCIUM CARBONATE-VITAMIN D 500-200 MG-UNIT PO TABS
1.0000 | ORAL_TABLET | Freq: Every day | ORAL | Status: DC
  Administered 2016-08-12: 1 via ORAL
  Filled 2016-08-11 (×2): qty 1

## 2016-08-11 MED ORDER — VANCOMYCIN HCL 10 G IV SOLR
2000.0000 mg | Freq: Once | INTRAVENOUS | Status: AC
  Administered 2016-08-11: 2000 mg via INTRAVENOUS
  Filled 2016-08-11: qty 2000

## 2016-08-11 MED ORDER — VANCOMYCIN HCL 10 G IV SOLR: 1250.0000 mg | Freq: Two times a day (BID) | INTRAVENOUS | Status: DC

## 2016-08-11 MED ORDER — ALBUTEROL SULFATE (2.5 MG/3ML) 0.083% IN NEBU: 2.5000 mg | INHALATION_SOLUTION | RESPIRATORY_TRACT | Status: DC | PRN

## 2016-08-11 MED ORDER — IPRATROPIUM BROMIDE 0.02 % IN SOLN
0.5000 mg | Freq: Once | RESPIRATORY_TRACT | Status: DC
  Filled 2016-08-11: qty 2.5

## 2016-08-11 MED ORDER — DEXTROSE 5 % IV SOLN: 2.0000 g | Freq: Once | INTRAVENOUS | Status: DC

## 2016-08-11 MED ORDER — LISINOPRIL 5 MG PO TABS
5.0000 mg | ORAL_TABLET | Freq: Every day | ORAL | Status: DC
  Administered 2016-08-12: 5 mg via ORAL
  Filled 2016-08-11 (×2): qty 1

## 2016-08-11 MED ORDER — HYDROMORPHONE HCL 2 MG/ML IJ SOLN
1.0000 mg | Freq: Once | INTRAMUSCULAR | Status: AC
  Administered 2016-08-11: 1 mg via INTRAVENOUS
  Filled 2016-08-11: qty 1

## 2016-08-11 MED ORDER — DIPHENHYDRAMINE HCL 50 MG/ML IJ SOLN
25.0000 mg | Freq: Once | INTRAMUSCULAR | Status: AC
  Administered 2016-08-11: 25 mg via INTRAVENOUS
  Filled 2016-08-11: qty 1

## 2016-08-11 MED ORDER — CLINDAMYCIN PHOSPHATE 600 MG/50ML IV SOLN
600.0000 mg | Freq: Once | INTRAVENOUS | Status: AC
  Administered 2016-08-11: 600 mg via INTRAVENOUS
  Filled 2016-08-11: qty 50

## 2016-08-11 MED ORDER — CLINDAMYCIN PHOSPHATE 600 MG/50ML IV SOLN
600.0000 mg | Freq: Three times a day (TID) | INTRAVENOUS | Status: DC
  Administered 2016-08-12 – 2016-08-13 (×4): 600 mg via INTRAVENOUS
  Filled 2016-08-11 (×3): qty 50

## 2016-08-11 NOTE — ED Notes (Signed)
Pt c/o breathing difficulty. MD notifed.

## 2016-08-11 NOTE — ED Notes (Signed)
Pt called out, this RN in room. Pt complains of shortness of breath and pt is shaking, airway in tact, some wheezing auscultated by this RN and Dr Regenia Skeeter. Pt's oral temp is 103.1. Pt already given tylenol

## 2016-08-11 NOTE — ED Notes (Signed)
Pt began c/o difficulty breathing and felt as thought someone was "sitting on her chest". Requested Vancomycin be stopped. MD notified.

## 2016-08-11 NOTE — Progress Notes (Addendum)
Pharmacy Antibiotic Note  Ronnisha Felber is a 65 y.o. female admitted on 08/11/2016 with cellulitis.  Pharmacy has been consulted for vancomycin and Zosyn dosing. Pt was initially discharged on doxycycline but has returned for worsening infection.  Plan: -Vancomycin '2000mg'$  IV x1 -Vancomycin '1000mg'$  IV q12h -Zosyn 3.375g IV q8h   Height: '5\' 2"'$  (157.5 cm) Weight: 285 lb (129.3 kg) IBW/kg (Calculated) : 50.1  Temp (24hrs), Avg:100 F (37.8 C), Min:100 F (37.8 C), Max:100 F (37.8 C)   Recent Labs Lab 08/09/16 2313 08/09/16 2349 08/10/16 0227 08/11/16 1832 08/11/16 1846  WBC 9.0  --   --  11.8*  --   CREATININE 0.79  --   --  0.94  --   LATICACIDVEN  --  1.41 1.31  --  2.91*    Estimated Creatinine Clearance: 77.1 mL/min (by C-G formula based on SCr of 0.94 mg/dL).    Allergies  Allergen Reactions  . Augmentin [Amoxicillin-Pot Clavulanate] Diarrhea  . Scallops [Shellfish Allergy] Hives  . Barium-Containing Compounds     Feels like cement in the body    Antimicrobials this admission: 12/23 Vancomycin >>  12/23 Zosyn >>   Dose adjustments this admission: none  Microbiology results: 12/23 UCx: IP    Thank you for allowing pharmacy to be a part of this patient's care.  Arrie Senate, PharmD PGY-1 Pharmacy Resident Pager: [phone] 08/11/2016

## 2016-08-11 NOTE — ED Provider Notes (Signed)
[facility] DEPT Provider Note   CSN: [id number] Arrival date & time: 08/11/16  1811     History   Chief Complaint Chief Complaint  Patient presents with  . Cellulitis    HPI Suzanne Macdonald is a 65 y.o. female.  HPI  65 year old female presents with worsening pain and redness to her right abdomen. She has been having issues with an infection postoperatively over the last couple months. A few days ago but less than one week ago she had a percutaneous drain removed. She feels like almost immediately after she has developed redness and pain to that area. She was seen in the ED on the night of the 21st and had a CT scan that showed a likely developing but small abscess and was placed on doxycycline. Since then she has been taking the doxycycline but also worsening. Has had a fever up to 101.8. She feels like the redness is worse. She also notes she's had congestion and cough are for 5 days" gets pneumonia easily". No urinary symptoms. No drainage from the site. Pain is an 8/10.  Past Medical History:  Diagnosis Date  . Asthma   . Atrial fibrillation ([place])   . Cervical cancer ([place]) 1972  . Colon cancer ([place])   . H/O gastric bypass   . Hyperlipidemia   . Hypertension   . Morbid obesity ([place]) 03/04/2016  . Sleep apnea     Patient Active Problem List   Diagnosis Date Noted  . Abdominal wall cellulitis 08/11/2016  . Atrial flutter ([place]) 07/12/2016  . Atrial fibrillation with RVR ([place]) 07/11/2016  . Fever   . At risk for surgical site infection   . Gram-negative infection   . Abdominal wall abscess 06/10/2016  . Aortic atherosclerosis ([place]) 05/09/2016  . Morbid obesity ([place]) 05/04/2016  . Incarcerated hernia 04/28/2016  . Osteopenia 03/15/2016  . Atrial fibrillation with tachycardic ventricular rate ([place])   . Asthma 03/02/2016  . Prediabetes 01/25/2016  . Essential hypertension 01/25/2016  . Vitamin D deficiency 01/25/2016  . 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  Vitamin B12 deficiency 01/25/2016  . Paroxysmal atrial fibrillation ([place]) 01/25/2016  . Health care maintenance 01/25/2016  . History of colon cancer 01/25/2016  . Ventral hernia 01/25/2016  . History of cervical cancer 01/25/2016    Past Surgical History:  Procedure Laterality Date  . ABDOMINAL HYSTERECTOMY  1998   Salpingoophorectomy. For tumorous growth.  . BOWEL RESECTION  2015   [place], [place] by Dr. Stormy Fabian  . CARDIAC CATHETERIZATION  05/2014   in [place], no sig CAD (done prior to surgery for colon CA)  . GASTRIC BYPASS  1992  . INSERTION OF MESH N/A 05/01/2016   Procedure: INSERTION OF MESH;  Surgeon: Excell Seltzer, MD;  Location: [facility];  Service: General;  Laterality: N/A;  . IR GENERIC HISTORICAL  06/28/2016   IR RADIOLOGIST EVAL & MGMT 06/28/2016 Markus Daft, MD [facility] INTERV RAD  . LAPAROSCOPIC GASTRIC BANDING     Placed 2013 and removed in 2014.  Marland Kitchen VENTRAL HERNIA REPAIR N/A 05/01/2016   Procedure: VENTRAL HERNIA REPAIR;  Surgeon: Excell Seltzer, MD;  Location: [facility];  Service: General;  Laterality: N/A;    OB History    No data available       Home Medications    Prior to Admission medications   Medication Sig Start Date End Date Taking? Authorizing Provider  acetaminophen (TYLENOL) 500 MG tablet Take 1,000 mg by mouth every 6 (six) hours as needed  for mild pain or moderate pain.   Yes Historical Provider, MD  albuterol (PROVENTIL HFA;VENTOLIN HFA) 108 (90 Base) MCG/ACT inhaler Inhale 1-2 puffs into the lungs every 6 (six) hours as needed for wheezing or shortness of breath.   Yes Historical Provider, MD  apixaban (ELIQUIS) 5 MG TABS tablet Take 1 tablet (5 mg total) by mouth 2 (two) times daily. 03/13/16  Yes Shela Leff, MD  atorvastatin (LIPITOR) 10 MG tablet take 1 tablet by mouth once daily 04/04/16  Yes Shela Leff, MD  budesonide-formoterol [facility]) 160-4.5 MCG/ACT inhaler Inhale 2 puffs into the lungs 2 (two) times daily. 01/24/16  Yes  Milagros Loll, MD  Calcium-Phosphorus-Vitamin D (CALCIUM/VITAMIN D3/ADULT GUMMY PO) Take 2 each by mouth daily.   Yes Historical Provider, MD  carvedilol (COREG) 12.5 MG tablet Take 1 tablet (12.5 mg total) by mouth 2 (two) times daily with a meal. 07/23/16  Yes Rhonda G Barrett, PA-C  Cholecalciferol (VITAMIN D3) 2000 units CHEW Chew 2,000 Units by mouth 2 (two) times daily.   Yes Historical Provider, MD  cyanocobalamin 500 MCG tablet Take 1,000 mcg by mouth daily.    Yes Historical Provider, MD  diltiazem (CARDIZEM CD) 120 MG 24 hr capsule Take 1 capsule (120 mg total) by mouth daily. 07/13/16  Yes Alphonzo Grieve, MD  diltiazem (CARDIZEM) 30 MG tablet Take 1 tablet every 4 hours AS NEEDED for AFIB fast heart rate >100 as long as blood pressure >100. 07/30/16  Yes Sherran Needs, NP  docusate sodium (COLACE) 100 MG capsule Take 1 capsule (100 mg total) by mouth 2 (two) times daily as needed for mild constipation. 06/14/16  Yes Ledell Noss, MD  flecainide (TAMBOCOR) 100 MG tablet take 1 tablet by mouth twice a day 08/06/16  Yes [name], MD  HYDROcodone-acetaminophen (NORCO/VICODIN) 5-325 MG tablet Take 1-2 tablets by mouth every 4 (four) hours as needed. 08/10/16  Yes Tiffany Carlota Raspberry, PA-C  lisinopril (PRINIVIL,ZESTRIL) 5 MG tablet take 1 tablet by mouth once daily 07/27/16  Yes [name], MD  Multiple Vitamins-Minerals (ALIVE WOMENS GUMMY) CHEW Chew 2 each by mouth daily.   Yes Historical Provider, MD  omeprazole (PRILOSEC) 40 MG capsule Take 1 capsule (40 mg total) by mouth daily. 04/27/16  Yes [name], MD  potassium chloride (K-DUR) 10 MEQ tablet 2 TABLETS BY MOUTH DAILY 06/27/16  Yes [name], MD  traZODone (DESYREL) 100 MG tablet Take 100 mg by mouth at bedtime. 04/22/16  Yes Historical Provider, MD  loperamide (IMODIUM) 2 MG capsule Take 2 mg by mouth daily as needed for diarrhea or loose stools.    Historical Provider, MD  prochlorperazine (COMPAZINE) 10 MG tablet  Take 10 mg by mouth every 6 (six) hours as needed for nausea or vomiting.    Historical Provider, MD    Family History Family History  Problem Relation Age of Onset  . Heart disease Mother   . Diabetes Mother   . Heart disease Father   . Stroke Father   . Diabetes Father   . Diabetes Sister   . Thyroid disease Sister   . Early death Brother     Killed by drunk driver  . Heart attack Brother   . Thyroid disease Daughter     s/p thyroidectomy  . Diabetes Sister   . Thyroid disease Sister   . Stroke Brother   . Drug abuse Brother   . Heart attack Brother     Social History Social History  Substance Use Topics  .  Smoking status: Former Smoker    Years: 0.20    Types: Cigarettes  . Smokeless tobacco: Never Used  . Alcohol use No     Comment: a beer every now and then     Allergies   Vancomycin; Augmentin [amoxicillin-pot clavulanate]; Scallops [shellfish allergy]; and Barium-containing compounds   Review of Systems Review of Systems  Constitutional: Positive for chills and fever.  HENT: Positive for congestion.   Respiratory: Positive for cough. Negative for shortness of breath.   Gastrointestinal: Positive for abdominal pain.  Skin: Positive for color change.  All other systems reviewed and are negative.    Physical Exam Updated Vital Signs BP 178/72   Pulse 77   Temp (!) 103.1 F (39.5 C) (Oral)   Resp 21   Ht '5\' 2"'$  (1.575 m)   Wt 285 lb (129.3 kg)   SpO2 100%   BMI 52.13 kg/m   Physical Exam  Constitutional: She is oriented to person, place, and time. She appears well-developed and well-nourished.  Morbidly obese  HENT:  Head: Normocephalic and atraumatic.  Right Ear: External ear normal.  Left Ear: External ear normal.  Nose: Nose normal.  Eyes: Right eye exhibits no discharge. Left eye exhibits no discharge.  Cardiovascular: Normal rate, regular rhythm and normal heart sounds.   Pulmonary/Chest: Effort normal and breath sounds normal.    Abdominal: Soft. There is tenderness.    Neurological: She is alert and oriented to person, place, and time.  Skin: Skin is warm and dry.  Nursing note and vitals reviewed.    ED Treatments / Results  Labs (all labs ordered are listed, but only abnormal results are displayed) Labs Reviewed  COMPREHENSIVE METABOLIC PANEL - Abnormal; Notable for the following:       Result Value   Sodium 132 (*)    CO2 20 (*)    Glucose, Bld 241 (*)    Calcium 8.7 (*)    Albumin 3.2 (*)    All other components within normal limits  CBC WITH DIFFERENTIAL/PLATELET - Abnormal; Notable for the following:    WBC 11.8 (*)    Hemoglobin 9.9 (*)    HCT 32.5 (*)    MCV 68.6 (*)    MCH 20.9 (*)    RDW 19.4 (*)    Neutro Abs 9.2 (*)    All other components within normal limits  URINALYSIS, ROUTINE W REFLEX MICROSCOPIC - Abnormal; Notable for the following:    Color, Urine AMBER (*)    APPearance HAZY (*)    Specific Gravity, Urine 1.033 (*)    Bilirubin Urine SMALL (*)    Ketones, ur 5 (*)    Protein, ur 30 (*)    Bacteria, UA RARE (*)    Squamous Epithelial / LPF 0-5 (*)    All other components within normal limits  I-STAT CG4 LACTIC ACID, ED - Abnormal; Notable for the following:    Lactic Acid, Venous 2.91 (*)    All other components within normal limits  CULTURE, BLOOD (ROUTINE X 2)  CULTURE, BLOOD (ROUTINE X 2)  URINE CULTURE  PROTIME-INR  I-STAT CG4 LACTIC ACID, ED    EKG  EKG Interpretation None       Radiology Dg Chest 2 View  Result Date: 08/11/2016 CLINICAL DATA:  Productive cough. History abdominal wall abscess. Drainage catheter removed 08/07/2016. EXAM: CHEST  2 VIEW COMPARISON:  Single-view of the chest 07/11/2016. FINDINGS: Lungs are clear. Heart size is normal. No pneumothorax or fluid fusion. Port-A-Cath  noted. No focal bony abnormality. IMPRESSION: No acute disease. Electronically Signed   By: Inge Rise M.D.   On: 08/11/2016 20:42   Ct Abdomen Pelvis W  Contrast  Result Date: 08/10/2016 CLINICAL DATA:  Abdominal pain today.  Febrile. EXAM: CT ABDOMEN AND PELVIS WITH CONTRAST TECHNIQUE: Multidetector CT imaging of the abdomen and pelvis was performed using the standard protocol following bolus administration of intravenous contrast. CONTRAST:  121m ISOVUE-300 IOPAMIDOL (ISOVUE-300) INJECTION 61% COMPARISON:  08/07/2016, 06/28/2016, 06/11/2016. FINDINGS: Lower chest: No acute abnormality. Hepatobiliary: No focal liver abnormality is seen. No gallstones, gallbladder wall thickening, or biliary dilatation. Pancreas: Unremarkable. No pancreatic ductal dilatation or surrounding inflammatory changes. Spleen: Normal in size without focal abnormality. Adrenals/Urinary Tract: Adrenal glands are unremarkable. Kidneys are normal, without renal calculi, focal lesion, or hydronephrosis. Bladder is unremarkable. Stomach/Bowel: Stomach is within normal limits. Small bowel anastomosis in the right lower quadrant is unremarkable except that it may be adherent to the undersurface of the anterior abdominal wall. This is unchanged. Appendix is normal. No evidence of colonic wall thickening or inflammation. Vascular/Lymphatic: The abdominal aorta is normal in caliber with mild atherosclerotic calcification. No adenopathy. Reproductive: Status post hysterectomy. No adnexal masses. Other: There is a 2.4 cm recurrent collection in the right lateral subcutaneous tissues, much smaller than the original collection. Mild surrounding stranding opacity extending up the skin surface. There is unchanged fluid collection along the anterior abdominal wall related to prior ventral hernia repair. The thickness of the collection measures up to 1.7 cm and the collection covers a large portion of the anterior abdomen. No gas within this collection. Musculoskeletal: No significant skeletal lesion. 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 IMPRESSION: 1. Recurrent air and fluid collection in the  right lateral subcutaneous tissues, much smaller than the original collection, only 2.4 cm at this time. There also is adjacent cellulitic-appearing stranding opacity extending out to the skin. 2. Persistent anterior abdominal wall fluid collection associated with prior ventral hernia repair although it is slowly decreasing in thickness. No air within this collection. 3. Small bowel anastomosis in the right lower quadrant may be adherent to the undersurface of the anterior abdominal wall. No fistula is evident. No bowel obstruction. Electronically Signed   By: DAndreas NewportM.D.   On: 08/10/2016 02:52    Procedures Procedures (including critical care time)  Medications Ordered in ED Medications  albuterol (PROVENTIL) (2.5 MG/3ML) 0.083% nebulizer solution 5 mg (not administered)  ipratropium (ATROVENT) nebulizer solution 0.5 mg (not administered)  acetaminophen (TYLENOL) tablet 1,000 mg (1,000 mg Oral Given 08/11/16 2046)  HYDROmorphone (DILAUDID) injection 1 mg (1 mg Intravenous Given 08/11/16 2207)  sodium chloride 0.9 % bolus 2,000 mL (2,000 mLs Intravenous New Bag/Given 08/11/16 2047)  vancomycin (VANCOCIN) 2,000 mg in sodium chloride 0.9 % 500 mL IVPB (0 mg Intravenous Stopped 08/11/16 2114)  ondansetron (ZOFRAN) injection 4 mg (4 mg Intravenous Given 08/11/16 2117)  diphenhydrAMINE (BENADRYL) injection 25 mg (25 mg Intravenous Given 08/11/16 2123)  clindamycin (CLEOCIN) IVPB 600 mg (600 mg Intravenous New Bag/Given 08/11/16 2218)  ibuprofen (ADVIL,MOTRIN) tablet 800 mg (800 mg Oral Given 08/11/16 2218)     Initial Impression / Assessment and Plan / ED Course  I have reviewed the triage vital signs and the nursing notes.  Pertinent labs & imaging results that were available during my care of the patient were reviewed by me and considered in my medical decision making (see chart for details).  Clinical Course as of Aug 12 2315  Sat Aug 11, 2016  1945 Will consult general surgery  given the CT 36 hours ago seemed to show some developing recurrent abscess. Broad IV antibiotics. She appears to have failed outpatient treatment as she is getting worse on the doxycycline and now has fevers at home. Fluids, vancomycin, Zosyn, likely admission. Probably will need recurrent drainage catheter  [SG]  2149 Internal medicine teaching service except patient for admission. Request telemetry full inpatient. Switch vancomycin to clindamycin.  [SG]    Clinical Course User Index [SG] Sherwood Gambler, MD    Of note, patient appeared to have a reaction to vancomycin. A few minutes after it was started she started developing chest tightness and flushing. This was stopped and she was switched to clindamycin. Dr. Hulen Skains has evaluated and recommends IV antibiotics and reevaluation, possibly CT in the next day or 2 if not improving. She also developed tachypnea, tremors, and shortness of breath. She showed no signs of anaphylaxis such as lip swelling, or pharyngeal swelling. Slight wheezes. She was given albuterol and was found to have a temperature of 103 which I think is likely cause of her new symptoms. Ibuprofen for fever. Admit to internal medicine teaching service.  Final Clinical Impressions(s) / ED Diagnoses   Final diagnoses:  Cellulitis of right abdominal wall  Sepsis, due to unspecified organism [facility])    New Prescriptions New Prescriptions   No medications on file     Sherwood Gambler, MD 08/11/16 2317

## 2016-08-11 NOTE — ED Triage Notes (Signed)
Pt was here two days ago for cellulitis, was told to return if the infection spread. Pt has her skin marked on her RL abdomen and it has spread past the line. Pt was given oral antibiotics. Pt c/o fevers at home, temp is 100.0 in triage. Pt in nad.

## 2016-08-11 NOTE — H&P (Signed)
Date: 08/12/2016               Patient Name:  Suzanne Macdonald MRN: [id number]  DOB: 18-Sep-1950 Age / Sex: 65 y.o., female   PCP: Bartholomew Crews, MD         Medical Service: Internal Medicine Teaching Service         Attending Physician: Dr. Joni Reining    First Contact: Dr. Lorella Nimrod Pager: [phone]  Second Contact: Dr. Shela Leff Pager: [phone]       After Hours (After 5p/  First Contact Pager: [phone]  weekends / holidays): Second Contact Pager: [phone]   Chief Complaint: Abdominal wall pain and swelling  History of Present Illness: Suzanne Macdonald is a 65 y.o. woman with PMH asthma, atrial fibrillation on Eliquis, morbid obesity, OSA, colon cancer s/p resection, gastric bypass, with history of abdominal abscess following ventral hernia repair who presents four days after her abdominal wall drain was removed for worsening erythema, pain, warmth over the prior drain site (RLQ) and fever up to 101.8 at home. Her complex recent surgical history started with repair of incarcerated ventral abdominal wall hernia via Dr. Excell Seltzer in September. In October she developed right lateral abdominal wall abscess with questionable communication to underlying small bowel anastomosis and underwent CT-guided drain placement on 10/23. Abscess culture grew E. Coli. Discharged on IV ceftriaxone but changed to po Augmentin due to port dysfunction. Surveillance CT imaging on 12/19 revealed apparent abscess resolution and the drain was removed. The following day she developed pain, erythema, and swelling over the drain site (RLQ) that has continued to worsen. She presented to the ED yesterday and abdominal CT revealed recurrent air and fluid collection in RLQ subcutaneous tissues. She was discharge home with po Doxycycline but her symptoms continued to worsen with expanding erythema, worsening pain, and new fever so she returned to the ED today. She also reports congestion, cough, rhinorrhea, and  anxiety. She denies wound drainage, nausea, vomiting, diarrhea, constipation, shortness of breath, or urinary symptoms.   In the ED she presented with temp 100 (climbed to 103.1 within 4 hours), HR 76, RR 16, BP 116/101, SpO2 98% on RA. Labs remarkable for WBC 11.8, lactic acid 2.91. She received 2 L IVF and was started on empiric Vanc/Zosyn. She developed chest pressure, shortness of breath, flushing 10 minutes into IV Vancomycin infusion - which was held, and symptoms resolved with IV Benadryl. Switched to IV Clindamycin. IMTS contacted for admission.   Meds:  Current Meds  Medication Sig  . acetaminophen (TYLENOL) 500 MG tablet Take 1,000 mg by mouth every 6 (six) hours as needed for mild pain or moderate pain.  Marland Kitchen albuterol (PROVENTIL HFA;VENTOLIN HFA) 108 (90 Base) MCG/ACT inhaler Inhale 1-2 puffs into the lungs every 6 (six) hours as needed for wheezing or shortness of breath.  Marland Kitchen apixaban (ELIQUIS) 5 MG TABS tablet Take 1 tablet (5 mg total) by mouth 2 (two) times daily.  Marland Kitchen atorvastatin (LIPITOR) 10 MG tablet take 1 tablet by mouth once daily  . budesonide-formoterol (SYMBICORT) 160-4.5 MCG/ACT inhaler Inhale 2 puffs into the lungs 2 (two) times daily.  . Calcium-Phosphorus-Vitamin D (CALCIUM/VITAMIN D3/ADULT GUMMY PO) Take 2 each by mouth daily.  . carvedilol (COREG) 12.5 MG tablet Take 1 tablet (12.5 mg total) by mouth 2 (two) times daily with a meal.  . Cholecalciferol (VITAMIN D3) 2000 units CHEW Chew 2,000 Units by mouth 2 (two) times daily.  . cyanocobalamin 500 MCG tablet Take 1,000  mcg by mouth daily.   Marland Kitchen diltiazem (CARDIZEM CD) 120 MG 24 hr capsule Take 1 capsule (120 mg total) by mouth daily.  Marland Kitchen diltiazem (CARDIZEM) 30 MG tablet Take 1 tablet every 4 hours AS NEEDED for AFIB fast heart rate >100 as long as blood pressure >100.  Marland Kitchen docusate sodium (COLACE) 100 MG capsule Take 1 capsule (100 mg total) by mouth 2 (two) times daily as needed for mild constipation.  . flecainide  (TAMBOCOR) 100 MG tablet take 1 tablet by mouth twice a day  . HYDROcodone-acetaminophen (NORCO/VICODIN) 5-325 MG tablet Take 1-2 tablets by mouth every 4 (four) hours as needed.  Marland Kitchen lisinopril (PRINIVIL,ZESTRIL) 5 MG tablet take 1 tablet by mouth once daily  . Multiple Vitamins-Minerals (ALIVE WOMENS GUMMY) CHEW Chew 2 each by mouth daily.  Marland Kitchen omeprazole (PRILOSEC) 40 MG capsule Take 1 capsule (40 mg total) by mouth daily.  . potassium chloride (K-DUR) 10 MEQ tablet 2 TABLETS BY MOUTH DAILY  . traZODone (DESYREL) 100 MG tablet Take 100 mg by mouth at bedtime.  . [DISCONTINUED] doxycycline (VIBRAMYCIN) 100 MG capsule Take 1 capsule (100 mg total) by mouth 2 (two) times daily.    Allergies: Allergies as of 08/11/2016 - Review Complete 08/11/2016  Allergen Reaction Noted  . Vancomycin Cough 08/11/2016  . Augmentin [amoxicillin-pot clavulanate] Diarrhea 07/11/2016  . Scallops [shellfish allergy] Hives 06/09/2016  . Barium-containing compounds  07/30/2016   Past Medical History:  Diagnosis Date  . Asthma   . Atrial fibrillation ([place])   . Cervical cancer ([place]) 1972  . Colon cancer ([place])   . H/O gastric bypass   . Hyperlipidemia   . Hypertension   . Morbid obesity ([place]) 03/04/2016  . Sleep apnea     Family History:  Family History  Problem Relation Age of Onset  . Heart disease Mother   . Diabetes Mother   . Heart disease Father   . Stroke Father   . Diabetes Father   . Diabetes Sister   . Thyroid disease Sister   . Early death Brother     Killed by drunk driver  . Heart attack Brother   . Thyroid disease Daughter     s/p thyroidectomy  . Diabetes Sister   . Thyroid disease Sister   . Stroke Brother   . Drug abuse Brother   . Heart attack Brother     Social History:  Social History   Social History  . Marital status: Married    Spouse name: N/A  . Number of children: N/A  . Years of education: N/A   Occupational History  . Not on file.   Social History Main  Topics  . Smoking status: Former Smoker    Years: 0.20    Types: Cigarettes  . Smokeless tobacco: Never Used  . Alcohol use No     Comment: a beer every now and then  . Drug use: No  . Sexual activity: No   Other Topics Concern  . Not on file   Social History Narrative  . No narrative on file    Review of Systems: A complete ROS was negative except as per HPI.   Physical Exam: Blood pressure (!) 120/55, pulse 72, temperature (!) 101.9 F (38.8 C), temperature source Oral, resp. rate 18, height '5\' 2"'$  (1.575 m), weight 284 lb 6.3 oz (129 kg), SpO2 97 %.  General appearance: Obese elderly woman resting comfortably in ER stretcher, in no distress, conversational HENT: Normocephalic, atraumatic, moist mucous membranes,  oropharynx clear, no sinus tenderness, neck supple no cervical lymphadenopathy Eyes: PERRL, non-icteric Cardiovascular: Regular rate and rhythm, no murmurs, rubs, gallops Respiratory: Clear to auscultation bilaterally, normal work of breathing Abdomen: Obese, BS+, soft, non-distended, approx 10x10 cm area of erythema over RLQ surrounding dry scab of previous drain site, mildly tender to palpation and warm to touch, no palpable crepitus, no rebound tenderness, RUQ also TTP Extremities: Obese bulk and normal range of motion, trace ankle edema, 2+ peripheral pulses Skin: Warm, dry, intact Psych: Appropriate affect, clear speech, thoughts linear and goal-directed  EKG: NSR  CXR: No acute disease  ABD CT: IMPRESSION: 1. Recurrent air and fluid collection in the right lateral subcutaneous tissues, much smaller than the original collection, only 2.4 cm at this time. There also is adjacent cellulitic-appearing stranding opacity extending out to the skin. 2. Persistent anterior abdominal wall fluid collection associated with prior ventral hernia repair although it is slowly decreasing in thickness. No air within this collection. 3. Small bowel anastomosis in the right lower  quadrant may be adherent to the undersurface of the anterior abdominal wall. No fistula is evident. No bowel obstruction.  Assessment & Plan by Problem: Principal Problem:   Abdominal wall cellulitis Active Problems:   Essential hypertension   Paroxysmal atrial fibrillation (HCC)   Asthma  Abdominal wall cellulitis vs recurrent abscess, following ventral hernia repair, 2.4 cm fluid and air collection at site of previous drainage catheter that developed few days after drain removed, grew E. Coli in October. Currently febrile but hemodynamically stable. Will cover MRSA given cellulitis and significant recent healthcare exposure, also broad gram negative coverage. - Appreciate surgery recommendations - Consult IR in the morning - IV Clinda 600 mg Q8H - IV Zosyn 3.375 mg Q8H - Follow blood cultures - Tylenol 650 mg Q6h PRN for pain or fever - Oxycodone '5mg'$  Q6h PRN for severe pain  Paroxysmal atrial fibrillation - recent admission in November for RVR, Cardizem added to her regimen. - Resume home Flecainide, Cardizem, Carvedilol - Resume Eliquis '5mg'$  BID  Asthma - Dulera inhaler BID - Albuterol nebs Q4 PRN  HTN - Resume home Lisinopril  HLD  - Resume home Lipitor  GERD - Protonix 80 mg QD  Insomnia - Resume home Trazodone '50mg'$  QHS  FEN/GI: HH diet, replete electrolytes as needed  DVT ppx: Eliquis  Code status: Full code  Dispo: Admit patient to Inpatient with expected length of stay greater than 2 midnights.  Signed: Asencion Partridge, MD 08/12/2016, 12:05 AM  Pager: [phone]

## 2016-08-11 NOTE — Consult Note (Signed)
Reason for Consult:Abdominal wall cellulitis Referring Physician: [name] is an 65 y.o. female.  HPI: Patient is status post repair of incarcerated ventral hernia with small enterotomy at the time of surgery.  Had RLQ percutaneous drain removed a few days ago, and has since been developing redness and soreness on the right side.  Seen in the ED two days ago and CT did not demonstrate an abscess cavity or fluid collection.  Repeat CT not done tonight  Past Medical History:  Diagnosis Date  . Asthma   . Atrial fibrillation ([place])   . Cervical cancer ([place]) 1972  . Colon cancer ([place])   . H/O gastric bypass   . Hyperlipidemia   . Hypertension   . Morbid obesity ([place]) 03/04/2016  . Sleep apnea     Past Surgical History:  Procedure Laterality Date  . ABDOMINAL HYSTERECTOMY  1998   Salpingoophorectomy. For tumorous growth.  . BOWEL RESECTION  2015   [place], [place] by Dr. Stormy Fabian  . CARDIAC CATHETERIZATION  05/2014   in [place], no sig CAD (done prior to surgery for colon CA)  . GASTRIC BYPASS  1992  . INSERTION OF MESH N/A 05/01/2016   Procedure: INSERTION OF MESH;  Surgeon: Excell Seltzer, MD;  Location: [facility];  Service: General;  Laterality: N/A;  . IR GENERIC HISTORICAL  06/28/2016   IR RADIOLOGIST EVAL & MGMT 06/28/2016 Markus Daft, MD [facility] INTERV RAD  . LAPAROSCOPIC GASTRIC BANDING     Placed 2013 and removed in 2014.  Marland Kitchen VENTRAL HERNIA REPAIR N/A 05/01/2016   Procedure: VENTRAL HERNIA REPAIR;  Surgeon: Excell Seltzer, MD;  Location: [facility] OR;  Service: General;  Laterality: N/A;    Family History  Problem Relation Age of Onset  . Heart disease Mother   . Diabetes Mother   . Heart disease Father   . Stroke Father   . Diabetes Father   . Diabetes Sister   . Thyroid disease Sister   . Early death Brother     Killed by drunk driver  . Heart attack Brother   . Thyroid disease Daughter     s/p thyroidectomy  . Diabetes Sister   . Thyroid disease Sister    . Stroke Brother   . Drug abuse Brother   . Heart attack Brother     Social History:  reports that she has quit smoking. Her smoking use included Cigarettes. She quit after 0.20 years of use. She has never used smokeless tobacco. She reports that she does not drink alcohol or use drugs.  Allergies:  Allergies  Allergen Reactions  . Vancomycin Cough    Pt began coughing and c/o tightening of chest/difficulty breathing.  . Augmentin [Amoxicillin-Pot Clavulanate] Diarrhea  . Scallops [Shellfish Allergy] Hives  . Barium-Containing Compounds     Feels like cement in the body    Medications: Prior to Admission:  (Not in a hospital admission)  Results for orders placed or performed during the hospital encounter of 08/11/16 (from the past 48 hour(s))  Comprehensive metabolic panel     Status: Abnormal   Collection Time: 08/11/16  6:32 PM  Result Value Ref Range   Sodium 132 (L) 135 - 145 mmol/L   Potassium 3.8 3.5 - 5.1 mmol/L   Chloride 101 101 - 111 mmol/L   CO2 20 (L) 22 - 32 mmol/L   Glucose, Bld 241 (H) 65 - 99 mg/dL   BUN 14 6 - 20 mg/dL   Creatinine, Ser 0.94 0.44 -  1.00 mg/dL   Calcium 8.7 (L) 8.9 - 10.3 mg/dL   Total Protein 6.5 6.5 - 8.1 g/dL   Albumin 3.2 (L) 3.5 - 5.0 g/dL   AST 27 15 - 41 U/L   ALT 21 14 - 54 U/L   Alkaline Phosphatase 65 38 - 126 U/L   Total Bilirubin 0.9 0.3 - 1.2 mg/dL   GFR calc non Af Amer >60 >60 mL/min   GFR calc Af Amer >60 >60 mL/min    Comment: (NOTE) The eGFR has been calculated using the CKD EPI equation. This calculation has not been validated in all clinical situations. eGFR's persistently <60 mL/min signify possible Chronic Kidney Disease.    Anion gap 11 5 - 15  CBC with Differential     Status: Abnormal   Collection Time: 08/11/16  6:32 PM  Result Value Ref Range   WBC 11.8 (H) 4.0 - 10.5 K/uL   RBC 4.74 3.87 - 5.11 MIL/uL   Hemoglobin 9.9 (L) 12.0 - 15.0 g/dL   HCT 32.5 (L) 36.0 - 46.0 %   MCV 68.6 (L) 78.0 - 100.0 fL    MCH 20.9 (L) 26.0 - 34.0 pg   MCHC 30.5 30.0 - 36.0 g/dL   RDW 19.4 (H) 11.5 - 15.5 %   Platelets 246 150 - 400 K/uL   Neutrophils Relative % 78 %   Lymphocytes Relative 14 %   Monocytes Relative 7 %   Eosinophils Relative 1 %   Basophils Relative 0 %   Neutro Abs 9.2 (H) 1.7 - 7.7 K/uL   Lymphs Abs 1.7 0.7 - 4.0 K/uL   Monocytes Absolute 0.8 0.1 - 1.0 K/uL   Eosinophils Absolute 0.1 0.0 - 0.7 K/uL   Basophils Absolute 0.0 0.0 - 0.1 K/uL   RBC Morphology POLYCHROMASIA PRESENT     Comment: ELLIPTOCYTES  Protime-INR     Status: None   Collection Time: 08/11/16  6:32 PM  Result Value Ref Range   Prothrombin Time 14.7 11.4 - 15.2 seconds   INR 1.15   I-Stat CG4 Lactic Acid, ED     Status: Abnormal   Collection Time: 08/11/16  6:46 PM  Result Value Ref Range   Lactic Acid, Venous 2.91 (HH) 0.5 - 1.9 mmol/L   Comment NOTIFIED PHYSICIAN   Urinalysis, Routine w reflex microscopic     Status: Abnormal   Collection Time: 08/11/16  7:37 PM  Result Value Ref Range   Color, Urine AMBER (A) YELLOW    Comment: BIOCHEMICALS MAY BE AFFECTED BY COLOR   APPearance HAZY (A) CLEAR   Specific Gravity, Urine 1.033 (H) 1.005 - 1.030   pH 5.0 5.0 - 8.0   Glucose, UA NEGATIVE NEGATIVE mg/dL   Hgb urine dipstick NEGATIVE NEGATIVE   Bilirubin Urine SMALL (A) NEGATIVE   Ketones, ur 5 (A) NEGATIVE mg/dL   Protein, ur 30 (A) NEGATIVE mg/dL   Nitrite NEGATIVE NEGATIVE   Leukocytes, UA NEGATIVE NEGATIVE   RBC / HPF 0-5 0 - 5 RBC/hpf   WBC, UA 0-5 0 - 5 WBC/hpf   Bacteria, UA RARE (A) NONE SEEN   Squamous Epithelial / LPF 0-5 (A) NONE SEEN   Mucous PRESENT   I-Stat CG4 Lactic Acid, ED     Status: None   Collection Time: 08/11/16  9:38 PM  Result Value Ref Range   Lactic Acid, Venous 1.53 0.5 - 1.9 mmol/L    Dg Chest 2 View  Result Date: 08/11/2016 CLINICAL DATA:  Productive cough. History  abdominal wall abscess. Drainage catheter removed 08/07/2016. EXAM: CHEST  2 VIEW COMPARISON:  Single-view  of the chest 07/11/2016. FINDINGS: Lungs are clear. Heart size is normal. No pneumothorax or fluid fusion. Port-A-Cath noted. No focal bony abnormality. IMPRESSION: No acute disease. Electronically Signed   By: Inge Rise M.D.   On: 08/11/2016 20:42   Ct Abdomen Pelvis W Contrast  Result Date: 08/10/2016 CLINICAL DATA:  Abdominal pain today.  Febrile. EXAM: CT ABDOMEN AND PELVIS WITH CONTRAST TECHNIQUE: Multidetector CT imaging of the abdomen and pelvis was performed using the standard protocol following bolus administration of intravenous contrast. CONTRAST:  166m ISOVUE-300 IOPAMIDOL (ISOVUE-300) INJECTION 61% COMPARISON:  08/07/2016, 06/28/2016, 06/11/2016. FINDINGS: Lower chest: No acute abnormality. Hepatobiliary: No focal liver abnormality is seen. No gallstones, gallbladder wall thickening, or biliary dilatation. Pancreas: Unremarkable. No pancreatic ductal dilatation or surrounding inflammatory changes. Spleen: Normal in size without focal abnormality. Adrenals/Urinary Tract: Adrenal glands are unremarkable. Kidneys are normal, without renal calculi, focal lesion, or hydronephrosis. Bladder is unremarkable. Stomach/Bowel: Stomach is within normal limits. Small bowel anastomosis in the right lower quadrant is unremarkable except that it may be adherent to the undersurface of the anterior abdominal wall. This is unchanged. Appendix is normal. No evidence of colonic wall thickening or inflammation. Vascular/Lymphatic: The abdominal aorta is normal in caliber with mild atherosclerotic calcification. No adenopathy. Reproductive: Status post hysterectomy. No adnexal masses. Other: There is a 2.4 cm recurrent collection in the right lateral subcutaneous tissues, much smaller than the original collection. Mild surrounding stranding opacity extending up the skin surface. There is unchanged fluid collection along the anterior abdominal wall related to prior ventral hernia repair. The thickness of the  collection measures up to 1.7 cm and the collection covers a large portion of the anterior abdomen. No gas within this collection. Musculoskeletal: No significant skeletal lesion. 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 IMPRESSION: 1. Recurrent air and fluid collection in the right lateral subcutaneous tissues, much smaller than the original collection, only 2.4 cm at this time. There also is adjacent cellulitic-appearing stranding opacity extending out to the skin. 2. Persistent anterior abdominal wall fluid collection associated with prior ventral hernia repair although it is slowly decreasing in thickness. No air within this collection. 3. Small bowel anastomosis in the right lower quadrant may be adherent to the undersurface of the anterior abdominal wall. No fistula is evident. No bowel obstruction. Electronically Signed   By: DAndreas NewportM.D.   On: 08/10/2016 02:52    Review of Systems  Constitutional: Positive for chills and fever.  Gastrointestinal: Positive for abdominal pain.  All other systems reviewed and are negative.  Blood pressure 178/72, pulse 77, temperature (!) 103.1 F (39.5 C), temperature source Oral, resp. rate 21, height _0  (1.575 m), weight 129.3 kg (285 lb), SpO2 100 %. Physical Exam  Constitutional:  Obese  HENT:  Head: Normocephalic and atraumatic.  Eyes: Conjunctivae and EOM are normal. Pupils are equal, round, and reactive to light.  Neck: Normal range of motion. Neck supple.  GI:      Assessment/Plan: Abdominal wall cellulitis with fevers a tprevious drain site from ventral hernia repair. May need repeat CT scan of the abdomen at some point in the future, but just had CT two days ago.  Suzanne Macdonald 08/11/2016, 11:04 PM

## 2016-08-12 ENCOUNTER — Encounter (HOSPITAL_COMMUNITY): Payer: Self-pay

## 2016-08-12 DIAGNOSIS — Z87891 Personal history of nicotine dependence: Secondary | ICD-10-CM

## 2016-08-12 DIAGNOSIS — Z7901 Long term (current) use of anticoagulants: Secondary | ICD-10-CM

## 2016-08-12 DIAGNOSIS — Z823 Family history of stroke: Secondary | ICD-10-CM

## 2016-08-12 DIAGNOSIS — L03311 Cellulitis of abdominal wall: Principal | ICD-10-CM

## 2016-08-12 DIAGNOSIS — Z91013 Allergy to seafood: Secondary | ICD-10-CM

## 2016-08-12 DIAGNOSIS — Z8249 Family history of ischemic heart disease and other diseases of the circulatory system: Secondary | ICD-10-CM

## 2016-08-12 DIAGNOSIS — Z8349 Family history of other endocrine, nutritional and metabolic diseases: Secondary | ICD-10-CM

## 2016-08-12 DIAGNOSIS — Z6841 Body Mass Index (BMI) 40.0 and over, adult: Secondary | ICD-10-CM

## 2016-08-12 DIAGNOSIS — J45909 Unspecified asthma, uncomplicated: Secondary | ICD-10-CM

## 2016-08-12 DIAGNOSIS — G4700 Insomnia, unspecified: Secondary | ICD-10-CM

## 2016-08-12 DIAGNOSIS — Z813 Family history of other psychoactive substance abuse and dependence: Secondary | ICD-10-CM

## 2016-08-12 DIAGNOSIS — B9620 Unspecified Escherichia coli [E. coli] as the cause of diseases classified elsewhere: Secondary | ICD-10-CM

## 2016-08-12 DIAGNOSIS — K219 Gastro-esophageal reflux disease without esophagitis: Secondary | ICD-10-CM

## 2016-08-12 DIAGNOSIS — Z79899 Other long term (current) drug therapy: Secondary | ICD-10-CM

## 2016-08-12 DIAGNOSIS — Z85038 Personal history of other malignant neoplasm of large intestine: Secondary | ICD-10-CM

## 2016-08-12 DIAGNOSIS — T814XXA Infection following a procedure, initial encounter: Secondary | ICD-10-CM

## 2016-08-12 DIAGNOSIS — Z91041 Radiographic dye allergy status: Secondary | ICD-10-CM

## 2016-08-12 DIAGNOSIS — Z881 Allergy status to other antibiotic agents status: Secondary | ICD-10-CM

## 2016-08-12 DIAGNOSIS — I480 Paroxysmal atrial fibrillation: Secondary | ICD-10-CM

## 2016-08-12 DIAGNOSIS — I10 Essential (primary) hypertension: Secondary | ICD-10-CM

## 2016-08-12 DIAGNOSIS — Z7951 Long term (current) use of inhaled steroids: Secondary | ICD-10-CM

## 2016-08-12 DIAGNOSIS — E785 Hyperlipidemia, unspecified: Secondary | ICD-10-CM

## 2016-08-12 DIAGNOSIS — Y838 Other surgical procedures as the cause of abnormal reaction of the patient, or of later complication, without mention of misadventure at the time of the procedure: Secondary | ICD-10-CM

## 2016-08-12 DIAGNOSIS — Z833 Family history of diabetes mellitus: Secondary | ICD-10-CM

## 2016-08-12 LAB — CBC
HEMATOCRIT: 29 % — AB (ref 36.0–46.0)
Hemoglobin: 8.9 g/dL — ABNORMAL LOW (ref 12.0–15.0)
MCH: 21 pg — ABNORMAL LOW (ref 26.0–34.0)
MCHC: 30.7 g/dL (ref 30.0–36.0)
MCV: 68.4 fL — ABNORMAL LOW (ref 78.0–100.0)
Platelets: 216 10*3/uL (ref 150–400)
RBC: 4.24 MIL/uL (ref 3.87–5.11)
RDW: 19.7 % — AB (ref 11.5–15.5)
WBC: 12.3 10*3/uL — AB (ref 4.0–10.5)

## 2016-08-12 LAB — BASIC METABOLIC PANEL
ANION GAP: 10 (ref 5–15)
BUN: 15 mg/dL (ref 6–20)
CO2: 20 mmol/L — ABNORMAL LOW (ref 22–32)
Calcium: 8 mg/dL — ABNORMAL LOW (ref 8.9–10.3)
Chloride: 106 mmol/L (ref 101–111)
Creatinine, Ser: 0.86 mg/dL (ref 0.44–1.00)
GFR calc Af Amer: >60 mL/min (ref >60)
Glucose, Bld: 162 mg/dL — ABNORMAL HIGH (ref 65–99)
POTASSIUM: 3.8 mmol/L (ref 3.5–5.1)
SODIUM: 136 mmol/L (ref 135–145)

## 2016-08-12 MED ORDER — ACETAMINOPHEN-CODEINE #3 300-30 MG PO TABS
1.0000 | ORAL_TABLET | ORAL | Status: DC | PRN
  Administered 2016-08-12 (×3): 2 via ORAL
  Filled 2016-08-12: qty 2
  Filled 2016-08-12: qty 1
  Filled 2016-08-12: qty 2

## 2016-08-12 MED ORDER — PANTOPRAZOLE SODIUM 40 MG PO TBEC
40.0000 mg | DELAYED_RELEASE_TABLET | Freq: Every day | ORAL | Status: DC | PRN
  Administered 2016-08-12: 40 mg via ORAL
  Filled 2016-08-12: qty 1

## 2016-08-12 NOTE — Progress Notes (Signed)
   Subjective: Still complaining of pain and tenderness of right lower quadrant.  Objective:  Vital signs in last 24 hours: Vitals:   08/11/16 2353 08/12/16 0640 08/12/16 0931 08/12/16 1009  BP: (!) 120/55 (!) 131/56  (!) 125/54  Pulse: 72 61  (!) 58  Resp: '18 17  17  '$ Temp: (!) 101.9 F (38.8 C) 98.7 F (37.1 C)  97.9 F (36.6 C)  TempSrc: Oral Oral  Oral  SpO2: 97% (!) 61% 92% 99%  Weight: 129 kg (284 lb 6.3 oz)     Height: '5\' 2"'$  (1.575 m)      Gen. Well-built, well-nourished, obese lady, lying comfortably in her bed, in no acute distress. Abdomen. Approximately 10 x 15 cm area of erythema at right lower quadrant, increased in temperature as compared to rest of abdomen, tenderness along right upper and lower quadrant. No discharge, no distention, no rebound or guarding. Bowel sounds positive. Chest. Clear bilaterally, no rhonchi or wheezing. CV. Regular rate and rhythm. Extremities. No edema, no cyanosis, pulses 2+ bilaterally.  Assessment/Plan:  Suzanne Macdonald is a 65 y.o. woman with PMH asthma, atrial fibrillation on Eliquis, morbid obesity, OSA, colon cancer s/p resection, gastric bypass, with history of abdominal abscess following ventral hernia repair who presents four days after her abdominal wall drain was removed for worsening erythema, pain, warmth over the prior drain site (RLQ) and fever up to 101.8 at home.  Abdominal wall cellulitis. She has an history of pansensitive Escherichia coli growth and her recently resolved abscess.  She remains afebrile today, with leukocytosis of 12.3. She developed pseudo-hypersensitivity to Vanc. Yesterday. Patient refused to try vancomycin at lower rate, she was started on clindamycin for MRSA coverage. -Dr. Excell Seltzer saw her today, he wants to continue Zosyn and Clinda. There might repeat CT abdomen a she remains febrile or her leukocyte count keep going up.  Hypertension. -Continue home dose of lisinopril.  Paroxysmal atrial  fibrillation. Currently in sinus rhythm. -Continue home dose of Eliquis, Cardizem, and Coreg. Eliquis needs to be held if for any procedure if needed.  Asthma. -Continue home dose Dulera and albuterol nebulizer every 4 when necessary.  HLD  - Resume home Lipitor  GERD - Protonix 80 mg QD  Insomnia - Resume home Trazodone '50mg'$  QHS  Dispo: Anticipated discharge in approximately 1 day(s).   Lorella Nimrod, MD 08/12/2016, 12:13 PM Pager: [phone]

## 2016-08-12 NOTE — Progress Notes (Signed)
Subjective: Still very tender RLQ but some better than admission  Objective: Vital signs in last 24 hours: Temp:  [97.9 F (36.6 C)-103.1 F (39.5 C)] 97.9 F (36.6 C) (12/24 1009) Pulse Rate:  [58-94] 58 (12/24 1009) Resp:  [16-35] 17 (12/24 1009) BP: (116-178)/(45-108) 125/54 (12/24 1009) SpO2:  [61 %-100 %] 99 % (12/24 1009) Weight:  [129 kg (284 lb 6.3 oz)-129.3 kg (285 lb)] 129 kg (284 lb 6.3 oz) (12/23 2353) Last BM Date: 08/11/16  Intake/Output from previous day: 12/23 0701 - 12/24 0700 In: 440 [P.O.:240; IV Piggyback:200] Out: 0  Intake/Output this shift: Total I/O In: 240 [P.O.:240] Out: -   General appearance: alert, cooperative, no distress and morbidly obese GI: Approx 10 X 15 cm area faint erythema RLQ abdominal wall around site of previous drain. No purulent or other drainage  Lab Results:   Recent Labs  08/11/16 1832 08/12/16 0039  WBC 11.8* 12.3*  HGB 9.9* 8.9*  HCT 32.5* 29.0*  PLT 246 216   BMET  Recent Labs  08/11/16 1832 08/12/16 0039  NA 132* 136  K 3.8 3.8  CL 101 106  CO2 20* 20*  GLUCOSE 241* 162*  BUN 14 15  CREATININE 0.94 0.86  CALCIUM 8.7* 8.0*   Recent Results (from the past 240 hour(s))  Culture, blood (Routine x 2)     Status: None (Preliminary result)   Collection Time: 08/11/16  6:32 PM  Result Value Ref Range Status   Specimen Description BLOOD LEFT ANTECUBITAL  Final   Special Requests BOTTLES DRAWN AEROBIC AND ANAEROBIC 5CC EA  Final   Culture NO GROWTH < 12 HOURS  Final   Report Status PENDING  Incomplete     Studies/Results: Dg Chest 2 View  Result Date: 08/11/2016 CLINICAL DATA:  Productive cough. History abdominal wall abscess. Drainage catheter removed 08/07/2016. EXAM: CHEST  2 VIEW COMPARISON:  Single-view of the chest 07/11/2016. FINDINGS: Lungs are clear. Heart size is normal. No pneumothorax or fluid fusion. Port-A-Cath noted. No focal bony abnormality. IMPRESSION: No acute disease. Electronically  Signed   By: Inge Rise M.D.   On: 08/11/2016 20:42    Anti-infectives: Anti-infectives    Start     Dose/Rate Route Frequency Ordered Stop   08/12/16 0900  vancomycin (VANCOCIN) 1,250 mg in sodium chloride 0.9 % 250 mL IVPB  Status:  Discontinued     1,250 mg 166.7 mL/hr over 90 Minutes Intravenous Every 12 hours 08/11/16 2004 08/11/16 2022   08/12/16 0900  vancomycin (VANCOCIN) IVPB 1000 mg/200 mL premix  Status:  Discontinued     1,000 mg 200 mL/hr over 60 Minutes Intravenous Every 12 hours 08/11/16 2022 08/11/16 2315   08/12/16 0600  clindamycin (CLEOCIN) IVPB 600 mg     600 mg 100 mL/hr over 30 Minutes Intravenous Every 8 hours 08/11/16 2355     08/12/16 0600  piperacillin-tazobactam (ZOSYN) IVPB 3.375 g     3.375 g 12.5 mL/hr over 240 Minutes Intravenous Every 8 hours 08/11/16 2355     08/11/16 2200  clindamycin (CLEOCIN) IVPB 600 mg     600 mg 100 mL/hr over 30 Minutes Intravenous  Once 08/11/16 2147 08/11/16 2321   08/11/16 2015  vancomycin (VANCOCIN) 2,000 mg in sodium chloride 0.9 % 500 mL IVPB     2,000 mg 250 mL/hr over 120 Minutes Intravenous  Once 08/11/16 2004 08/11/16 2114   08/11/16 2015  piperacillin-tazobactam (ZOSYN) IVPB 3.375 g  Status:  Discontinued  3.375 g 12.5 mL/hr over 240 Minutes Intravenous Every 8 hours 08/11/16 2004 08/11/16 2245   08/11/16 2000  aztreonam (AZACTAM) 2 g in dextrose 5 % 50 mL IVPB  Status:  Discontinued     2 g 100 mL/hr over 30 Minutes Intravenous  Once 08/11/16 1947 08/11/16 1959   08/11/16 2000  vancomycin (VANCOCIN) IVPB 1000 mg/200 mL premix  Status:  Discontinued     1,000 mg 200 mL/hr over 60 Minutes Intravenous  Once 08/11/16 1947 08/11/16 1959      Assessment/Plan: 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  Extensive previous surgery. Enterotomy RLQ closed at time of surgery, Phasix mesh used.  Presented about 6 weeks later with large RLQ abd wall abscess, S/P perc drainage.  Cx grew E Coli  with other organisms on gm stain.  Drain recently removed with resolution of abscess, no GI communication ever demonstrated,  Negative drain injection when drain removed about 2 weeks ago.  Now with recurrent small (2.5 cm) abscess and abd wall cellulitis.  Would continue current Rx and observe closely.  Repeat CT if she fails to improve.    LOS: 1 day    Matthias Bogus T 08/12/2016

## 2016-08-12 NOTE — H&P (Signed)
Internal Medicine Attending Admission Note  I saw and evaluated the patient. I reviewed the resident's note and I agree with the resident's findings and plan as documented in the resident's note.  Assessment & Plan by Problem:   Abdominal wall cellulitis - Previous culture was pan sensitive E Coli, was recently changed to doxycycline in ED but had just started taking for concern for MRSA given healthcare exposure. - Her reaction to Vancomycin is pretty classic for "red man syndrome" or vanc pseudo-hypersensitivity, I tried to discuss this with her that it is not a true allergy but the rate needs to be slowed, however she states that she never wants to have Vancomycin again.  She currently is on IV clinda and IV Zosyn, she does have fevers and had the elevated lactic acid but otherwise is hemodynamically stable. - Will await Dr Hoxworth's evaluation however I am inclined to treat her with oral Bactrim which would have excellent MRSA coverage (better than clinda and doxy on our antibiogram, and well as excellent E Coli coverage).    Essential hypertension -Continue lisinopril    Paroxysmal atrial fibrillation (HCC) - Continue home medications, will need to hold eliquis if IR or surgery is needed however there was no clear abscess on the CT   Chief Complaint(s): abdominal pain  History - key components related to admission: Birefly Suzanne Macdonald is a 65 year old female with past medical history of asthma, A. fib, obesity, OSA, colon cancer, and recent abdominal abscess following ventral hernia repair. That abscess was found to be due to pansensitive Escherichia coli and treated with a drain as well as Augmentin. CT scan on 1219 revealed resolution of abscess and the drain was pulled however the following day she developed increased pain and erythema at the site and she presented to the ED where the CT revealed repeat fluid collection without evidence of abscess and she was discharged home on oral  doxycycline. Her pain continued to worsen that she reports an to the ED. In the ED she was noted to be febrile however her other vital signs were normal. A lactic acid was checked and was mildly elevated at 2.91 therefore she was treated per sepsis protocol with IV fluids and empiric Vanco and Zosyn. 10 minutes into the infusion of vancomycin she developed chest pressure and shortness of breath and flushing of her head and face the infusion was stopped and she was treated with IV Benadryl with resolution of her symptoms.  Lab results: Reviewed in Epic  Physical Exam - key components related to admission: General: resting in bed in no distress HEENT: EOMI, no scleral icterus Cardiac: RRR, no rubs, murmurs or gallops Pulm: clear to auscultation bilaterally, moving normal volumes of air Abd: erythema and tenderness over RLQ with scab from previous drain site, no purulent discharge Ext: warm and well perfused, no pedal edema   Vitals:   08/11/16 2353 08/12/16 0640 08/12/16 0931 08/12/16 1009  BP: (!) 120/55 (!) 131/56  (!) 125/54  Pulse: 72 61  (!) 58  Resp: '18 17  17  '$ Temp: (!) 101.9 F (38.8 C) 98.7 F (37.1 C)  97.9 F (36.6 C)  TempSrc: Oral Oral  Oral  SpO2: 97% (!) 61% 92% 99%  Weight: 284 lb 6.3 oz (129 kg)     Height: '5\' 2"'$  (1.575 m)      I suspect the 61% O2 vital is an error or typo.

## 2016-08-13 LAB — CBC
HCT: 27.4 % — ABNORMAL LOW (ref 36.0–46.0)
HEMOGLOBIN: 8.2 g/dL — AB (ref 12.0–15.0)
MCH: 20.9 pg — AB (ref 26.0–34.0)
MCHC: 29.9 g/dL — AB (ref 30.0–36.0)
MCV: 69.7 fL — AB (ref 78.0–100.0)
Platelets: 199 10*3/uL (ref 150–400)
RBC: 3.93 MIL/uL (ref 3.87–5.11)
RDW: 20.2 % — ABNORMAL HIGH (ref 11.5–15.5)
WBC: 8.9 10*3/uL (ref 4.0–10.5)

## 2016-08-13 LAB — URINE CULTURE

## 2016-08-13 MED ORDER — ACETAMINOPHEN-CODEINE #3 300-30 MG PO TABS: 1.0000 | ORAL_TABLET | ORAL | 0 refills | Status: DC | PRN

## 2016-08-13 MED ORDER — SULFAMETHOXAZOLE-TRIMETHOPRIM 800-160 MG PO TABS: 1.0000 | ORAL_TABLET | Freq: Two times a day (BID) | ORAL | 0 refills | Status: DC

## 2016-08-13 MED ORDER — SODIUM CHLORIDE 0.9% FLUSH
10.0000 mL | INTRAVENOUS | Status: DC | PRN
  Administered 2016-08-13: 10 mL
  Filled 2016-08-13: qty 40

## 2016-08-13 MED ORDER — HEPARIN SOD (PORK) LOCK FLUSH 100 UNIT/ML IV SOLN
500.0000 [IU] | INTRAVENOUS | Status: AC | PRN
  Administered 2016-08-13: 500 [IU]

## 2016-08-13 MED ORDER — HEPARIN SOD (PORK) LOCK FLUSH 100 UNIT/ML IV SOLN: 500.0000 [IU] | INTRAVENOUS | Status: DC | PRN

## 2016-08-13 MED ORDER — SODIUM CHLORIDE 0.9% FLUSH: 10.0000 mL | Freq: Two times a day (BID) | INTRAVENOUS | Status: DC

## 2016-08-13 MED ORDER — ALUM & MAG HYDROXIDE-SIMETH 200-200-20 MG/5ML PO SUSP
30.0000 mL | ORAL | Status: DC | PRN
  Administered 2016-08-13: 30 mL via ORAL

## 2016-08-13 NOTE — Discharge Instructions (Signed)

## 2016-08-13 NOTE — Progress Notes (Signed)
Patients B/P was 133/49 and HR 63. MD notified. Ordered to hold lisinopril and give cardizem. Will continue to monitor.

## 2016-08-13 NOTE — Progress Notes (Signed)
Subjective: Feels much better today. No pain. Happy to be going home today.  Objective: Vital signs in last 24 hours: Temp:  [98.5 F (36.9 C)-99.6 F (37.6 C)] 98.5 F (36.9 C) (12/25 0952) Pulse Rate:  [58-68] 63 (12/25 0952) Resp:  [17-18] 18 (12/25 0952) BP: (115-153)/(37-66) 133/49 (12/25 0952) SpO2:  [96 %-99 %] 97 % (12/25 0952) Last BM Date: 08/13/16  Intake/Output from previous day: 12/24 0701 - 12/25 0700 In: 1060 [P.O.:1060] Out: 500 [Urine:500] Intake/Output this shift: Total I/O In: 220 [P.O.:220] Out: 0   General appearance: alert, cooperative and no distress Incision/Wound: Faint erythema right side of pannus somewhat improved. Much less tender today.  Lab Results:   Recent Labs  08/12/16 0039 08/13/16 0030  WBC 12.3* 8.9  HGB 8.9* 8.2*  HCT 29.0* 27.4*  PLT 216 199   BMET  Recent Labs  08/11/16 1832 08/12/16 0039  NA 132* 136  K 3.8 3.8  CL 101 106  CO2 20* 20*  GLUCOSE 241* 162*  BUN 14 15  CREATININE 0.94 0.86  CALCIUM 8.7* 8.0*     Studies/Results: Dg Chest 2 View  Result Date: 08/11/2016 CLINICAL DATA:  Productive cough. History abdominal wall abscess. Drainage catheter removed 08/07/2016. EXAM: CHEST  2 VIEW COMPARISON:  Single-view of the chest 07/11/2016. FINDINGS: Lungs are clear. Heart size is normal. No pneumothorax or fluid fusion. Port-A-Cath noted. No focal bony abnormality. IMPRESSION: No acute disease. Electronically Signed   By: Inge Rise M.D.   On: 08/11/2016 20:42    Anti-infectives: Anti-infectives    Start     Dose/Rate Route Frequency Ordered Stop   08/13/16 0000  sulfamethoxazole-trimethoprim (BACTRIM DS,SEPTRA DS) 800-160 MG tablet     1 tablet Oral 2 times daily 08/13/16 1045 08/25/16 2359   08/12/16 0900  vancomycin (VANCOCIN) 1,250 mg in sodium chloride 0.9 % 250 mL IVPB  Status:  Discontinued     1,250 mg 166.7 mL/hr over 90 Minutes Intravenous Every 12 hours 08/11/16 2004 08/11/16 2022   08/12/16 0900  vancomycin (VANCOCIN) IVPB 1000 mg/200 mL premix  Status:  Discontinued     1,000 mg 200 mL/hr over 60 Minutes Intravenous Every 12 hours 08/11/16 2022 08/11/16 2315   08/12/16 0600  clindamycin (CLEOCIN) IVPB 600 mg     600 mg 100 mL/hr over 30 Minutes Intravenous Every 8 hours 08/11/16 2355     08/12/16 0600  piperacillin-tazobactam (ZOSYN) IVPB 3.375 g     3.375 g 12.5 mL/hr over 240 Minutes Intravenous Every 8 hours 08/11/16 2355     08/11/16 2200  clindamycin (CLEOCIN) IVPB 600 mg     600 mg 100 mL/hr over 30 Minutes Intravenous  Once 08/11/16 2147 08/11/16 2321   08/11/16 2015  vancomycin (VANCOCIN) 2,000 mg in sodium chloride 0.9 % 500 mL IVPB     2,000 mg 250 mL/hr over 120 Minutes Intravenous  Once 08/11/16 2004 08/11/16 2114   08/11/16 2015  piperacillin-tazobactam (ZOSYN) IVPB 3.375 g  Status:  Discontinued     3.375 g 12.5 mL/hr over 240 Minutes Intravenous Every 8 hours 08/11/16 2004 08/11/16 2245   08/11/16 2000  aztreonam (AZACTAM) 2 g in dextrose 5 % 50 mL IVPB  Status:  Discontinued     2 g 100 mL/hr over 30 Minutes Intravenous  Once 08/11/16 1947 08/11/16 1959   08/11/16 2000  vancomycin (VANCOCIN) IVPB 1000 mg/200 mL premix  Status:  Discontinued     1,000 mg 200 mL/hr over 60 Minutes  Intravenous  Once 08/11/16 1947 08/11/16 1959      Assessment/Plan: 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  Extensive previous surgery. Enterotomy RLQ closed at time of surgery, Phasix mesh used.  Presented about 6 weeks later with large RLQ abd wall abscess, S/P perc drainage.  Cx grew E Coli with other organisms on gm stain.  Drain recently removed with resolution of abscess, no GI communication ever demonstrated,  Negative drain injection when drain removed about 2 weeks ago.  Now with recurrent small (2.5 cm) abscess and abd wall cellulitis.   has improved significantly on a short course of IV antibiotics. Patient is to be discharged today on by  mouth Septra. Would continue this for at least 2 weeks. I will see her back in the office at that time for follow-up.     LOS: 2 days    Suzanne Macdonald T 08/13/2016

## 2016-08-13 NOTE — Discharge Summary (Signed)
Name: Suzanne Macdonald MRN: [id number] DOB: Jan 31, 1951 65 y.o. PCP: Bartholomew Crews, MD  Date of Admission: 08/11/2016  7:08 PM Date of Discharge: 08/14/2016 Attending Physician: No att. providers found  Discharge Diagnosis: 1. Abdominal wall cellulitis. Principal Problem:   Abdominal wall cellulitis Active Problems:   Essential hypertension   Paroxysmal atrial fibrillation (HCC)   Asthma   Discharge Medications: Allergies as of 08/13/2016      Reactions   Augmentin [amoxicillin-pot Clavulanate] Diarrhea   Scallops [shellfish Allergy] Hives   Vancomycin Other (See Comments)   Pt began coughing and c/o tightening of chest/difficulty breathing and flushing during Vanc infusion.  Reports she never wants to have Vancomycin again even if it can be prevented with rate reduction   Barium-containing Compounds    Feels like cement in the body      Medication List    TAKE these medications   acetaminophen 500 MG tablet Commonly known as:  TYLENOL Take 1,000 mg by mouth every 6 (six) hours as needed for mild pain or moderate pain.   acetaminophen-codeine 300-30 MG tablet Commonly known as:  TYLENOL #3 Take 1-2 tablets by mouth every 4 (four) hours as needed for moderate pain.   albuterol 108 (90 Base) MCG/ACT inhaler Commonly known as:  PROVENTIL HFA;VENTOLIN HFA Inhale 1-2 puffs into the lungs every 6 (six) hours as needed for wheezing or shortness of breath.   ALIVE WOMENS GUMMY Chew Chew 2 each by mouth daily.   apixaban 5 MG Tabs tablet Commonly known as:  ELIQUIS Take 1 tablet (5 mg total) by mouth 2 (two) times daily.   atorvastatin 10 MG tablet Commonly known as:  LIPITOR take 1 tablet by mouth once daily   budesonide-formoterol 160-4.5 MCG/ACT inhaler Commonly known as:  SYMBICORT Inhale 2 puffs into the lungs 2 (two) times daily.   CALCIUM/VITAMIN D3/ADULT GUMMY PO Take 2 each by mouth daily.   carvedilol 12.5 MG tablet Commonly known as:  COREG Take  1 tablet (12.5 mg total) by mouth 2 (two) times daily with a meal.   cyanocobalamin 500 MCG tablet Take 1,000 mcg by mouth daily.   diltiazem 120 MG 24 hr capsule Commonly known as:  CARDIZEM CD Take 1 capsule (120 mg total) by mouth daily.   diltiazem 30 MG tablet Commonly known as:  CARDIZEM Take 1 tablet every 4 hours AS NEEDED for AFIB fast heart rate >100 as long as blood pressure >100.   docusate sodium 100 MG capsule Commonly known as:  COLACE Take 1 capsule (100 mg total) by mouth 2 (two) times daily as needed for mild constipation.   flecainide 100 MG tablet Commonly known as:  TAMBOCOR take 1 tablet by mouth twice a day   HYDROcodone-acetaminophen 5-325 MG tablet Commonly known as:  NORCO/VICODIN Take 1-2 tablets by mouth every 4 (four) hours as needed.   lisinopril 5 MG tablet Commonly known as:  PRINIVIL,ZESTRIL take 1 tablet by mouth once daily   loperamide 2 MG capsule Commonly known as:  IMODIUM Take 2 mg by mouth daily as needed for diarrhea or loose stools.   omeprazole 40 MG capsule Commonly known as:  PRILOSEC Take 1 capsule (40 mg total) by mouth daily.   potassium chloride 10 MEQ tablet Commonly known as:  K-DUR 2 TABLETS BY MOUTH DAILY   prochlorperazine 10 MG tablet Commonly known as:  COMPAZINE Take 10 mg by mouth every 6 (six) hours as needed for nausea or vomiting.   sulfamethoxazole-trimethoprim 800-160  MG tablet Commonly known as:  BACTRIM DS,SEPTRA DS Take 1 tablet by mouth 2 (two) times daily.   traZODone 100 MG tablet Commonly known as:  DESYREL Take 100 mg by mouth at bedtime.   Vitamin D3 2000 units Chew Chew 2,000 Units by mouth 2 (two) times daily.       Disposition and follow-up:   [name] was discharged from [facility] in Good condition.  At the hospital follow up visit please address:  1.  Any ongoing signs of infection.  2.  Labs / imaging needed at time of follow-up: None  3.  Pending  labs/ test needing follow-up: None  Follow-up Appointments: Follow-up Information    HOXWORTH,BENJAMIN T, MD. Schedule an appointment as soon as possible for a visit in 2 week(s).   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 Extensive previous surgery. Enterotomy RLQ closed at time of surgery, Phasix mesh used. Presented about 6 weeks later with large RLQ abd wall abscess, S/P perc drainage. Cx grew E Coli with other organisms on gm stain. Drain recently removed with resolution of abscess, no GI communication ever demonstrated, Negative drain injection when drain removed about 2 weeks ago. Now with recurrent small (2.5 cm) abscess and abd wall cellulitis.  Abdominal wall cellulitis. She improved clinically with Zosyn and clindamycin. Her erythema and edema improved. White cell count, trending down. She was discharged on Bactrim for 2 weeks. She will need to follow up with Dr. Excell Seltzer in about 2 weeks after discharge.  Hypertension. She remained normotensive during her stay. Her home dose of lisinopril was continued.  Paroxysmal atrial fibrillation. She remained in sinus rhythm during her current stay in hospital. Her home dose of Eliquis, Cardizem and Coreg was continued.  Asthma. No sign of any acute exacerbation. We continue her home dose of Dulera and albuterol.  Hyperlipidemia. Her home dose of Lipitor was continued.  GERD. Home dose of Protonix 80 mg daily was continued.  Discharge Vitals:   BP (!) 133/49 (BP Location: Left Wrist)   Pulse 63   Temp 98.5 F (36.9 C) (Oral)   Resp 18   Ht '5\' 2"'$  (1.575 m)   Wt 129 kg (284 lb 6.3 oz)   SpO2 97%   BMI  52.02 kg/m   Gen.Well-built, well-nourished, obese lady, lying comfortably in her bed, in no acute distress. Abdomen. Her erythema and left lower quadrant edema has been improved. Still having mild erythema. Mildly tender left lower quadrant. No distention, no rebound, bowel sounds positive. Chest.Clear bilaterally, no rhonchi or wheezing. CV.Regular rate and rhythm. Extremities.No edema, no cyanosis, pulses 2+ bilaterally.  Pertinent Labs, Studies, and Procedures:   CBC Latest Ref Rng & Units 08/13/2016 08/12/2016 08/11/2016  WBC 4.0 - 10.5 K/uL 8.9 12.3(H) 11.8(H)  Hemoglobin 12.0 - 15.0 g/dL 8.2(L) 8.9(L) 9.9(L)  Hematocrit 36.0 - 46.0 % 27.4(L) 29.0(L) 32.5(L)  Platelets 150 - 400 K/uL 199 216 246   CMP Latest Ref Rng & Units 08/12/2016 08/11/2016 08/09/2016  Glucose 65 - 99 mg/dL 162(H) 241(H) 139(H)  BUN 6 - 20 mg/dL '15 14 13  '$ Creatinine 0.44 - 1.00 mg/dL 0.86 0.94 0.79  Sodium  135 - 145 mmol/L 136 132(L) 138  Potassium 3.5 - 5.1 mmol/L 3.8 3.8 4.2  Chloride 101 - 111 mmol/L 106 101 105  CO2 22 - 32 mmol/L 20(L) 20(L) 24  Calcium 8.9 - 10.3 mg/dL 8.0(L) 8.7(L) 8.9  Total Protein 6.5 - 8.1 g/dL - 6.5 6.2(L)  Total Bilirubin 0.3 - 1.2 mg/dL - 0.9 0.3  Alkaline Phos 38 - 126 U/L - 65 81  AST 15 - 41 U/L - 27 25  ALT 14 - 54 U/L - 21 24   Urinalysis    Component Value Date/Time   COLORURINE AMBER (A) 08/11/2016 1937   APPEARANCEUR HAZY (A) 08/11/2016 1937   LABSPEC 1.033 (H) 08/11/2016 1937   PHURINE 5.0 08/11/2016 1937   GLUCOSEU NEGATIVE 08/11/2016 1937   HGBUR NEGATIVE 08/11/2016 1937   BILIRUBINUR SMALL (A) 08/11/2016 1937   KETONESUR 5 (A) 08/11/2016 1937   PROTEINUR 30 (A) 08/11/2016 1937   NITRITE NEGATIVE 08/11/2016 1937   LEUKOCYTESUR NEGATIVE 08/11/2016 1937    Lactic Acid. 2.91>> 1.53 Blood culture. No growth  DG Chest. FINDINGS: Lungs are clear. Heart size is normal. No pneumothorax or fluid fusion. Port-A-Cath noted. No focal bony  abnormality.  IMPRESSION: No acute disease.  CT abdomen and pelvis with contrast. FINDINGS: Lower chest: No acute abnormality.  Hepatobiliary: No focal liver abnormality is seen. No gallstones, gallbladder wall thickening, or biliary dilatation.  Pancreas: Unremarkable. No pancreatic ductal dilatation or surrounding inflammatory changes.  Spleen: Normal in size without focal abnormality.  Adrenals/Urinary Tract: Adrenal glands are unremarkable. Kidneys are normal, without renal calculi, focal lesion, or hydronephrosis. Bladder is unremarkable.  Stomach/Bowel: Stomach is within normal limits. Small bowel anastomosis in the right lower quadrant is unremarkable except that it may be adherent to the undersurface of the anterior abdominal wall. This is unchanged. Appendix is normal. No evidence of colonic wall thickening or inflammation.  Vascular/Lymphatic: The abdominal aorta is normal in caliber with mild atherosclerotic calcification. No adenopathy.  Reproductive: Status post hysterectomy. No adnexal masses.  Other: There is a 2.4 cm recurrent collection in the right lateral subcutaneous tissues, much smaller than the original collection. Mild surrounding stranding opacity extending up the skin surface.  There is unchanged fluid collection along the anterior abdominal wall related to prior ventral hernia repair. The thickness of the collection measures up to 1.7 cm and the collection covers a large portion of the anterior abdomen. No gas within this collection.  Musculoskeletal: No significant skeletal lesion. 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  IMPRESSION: 1. Recurrent air and fluid collection in the right lateral subcutaneous tissues, much smaller than the original collection, only 2.4 cm at this time. There also is adjacent cellulitic-appearing stranding opacity extending out to the skin. 2. Persistent anterior abdominal wall fluid collection associated with  prior ventral hernia repair although it is slowly decreasing in thickness. No air within this collection. 3. Small bowel anastomosis in the right lower quadrant may be adherent to the undersurface of the anterior abdominal wall. No fistula is evident. No bowel obstruction.   Discharge Instructions: Discharge Instructions    Diet - low sodium heart healthy    Complete by:  As directed    Discharge instructions    Complete by:  As directed    It was pleasure taking care of you. Please complete your antibiotic course as directed. Please follow-up with your surgeon if your symptoms fail to improve or got worse.   Increase activity slowly  Complete by:  As directed       Signed: Lorella Nimrod, MD 08/14/2016, 1:16 PM   Pager: [phone]

## 2016-08-13 NOTE — Progress Notes (Signed)
   Subjective: Patient was feeling better when seen this morning. She was stating that her pain and swelling has been improved. She was looking forward to go home today.  Objective:  Vital signs in last 24 hours: Vitals:   08/12/16 1009 08/12/16 1823 08/12/16 2140 08/13/16 0510  BP: (!) 125/54 (!) 115/43 (!) 123/37 (!) 153/66  Pulse: (!) 58 (!) 58 65 68  Resp: '17 18 17 17  '$ Temp: 97.9 F (36.6 C) 98.7 F (37.1 C) 98.9 F (37.2 C) 99.6 F (37.6 C)  TempSrc: Oral Oral Oral Oral  SpO2: 99% 99% 97% 96%  Weight:      Height:       Gen. Well-built, well-nourished, obese lady, lying comfortably in her bed, in no acute distress. Abdomen. Her erythema and left lower quadrant edema has been improved. Still having mild erythema. Mildly tender left lower quadrant. No distention, no rebound, bowel sounds positive. Chest. Clear bilaterally, no rhonchi or wheezing. CV. Regular rate and rhythm. Extremities. No edema, no cyanosis, pulses 2+ bilaterally.  Labs. CBC Latest Ref Rng & Units 08/13/2016 08/12/2016 08/11/2016  WBC 4.0 - 10.5 K/uL 8.9 12.3(H) 11.8(H)  Hemoglobin 12.0 - 15.0 g/dL 8.2(L) 8.9(L) 9.9(L)  Hematocrit 36.0 - 46.0 % 27.4(L) 29.0(L) 32.5(L)  Platelets 150 - 400 K/uL 199 216 246    Assessment/Plan:  Suzanne Macdonald a 65 y.o.woman with PMH asthma, atrial fibrillation on Eliquis, morbid obesity, OSA, colon cancer s/p resection, gastric bypass, with history of abdominal abscess following ventral hernia repair who presents four days after her abdominal wall drain was removed for worsening erythema, pain, warmth over the prior drain site (RLQ) and fever up to 101.8 at home.  Abdominal wall cellulitis. She has an history of pansensitive Escherichia coli growth and her recently resolved abscess.  Her white cell counts are improving and has been normalized at 8.9 today. She remained afebrile. Her right lower quadrant erythema and pain is improving. She can be discharged home on  Bactrim for a total of 14 days of antibiotics.  Hypertension. -Continue home dose of lisinopril.  Paroxysmal atrial fibrillation. Currently in sinus rhythm. -Continue home dose of Eliquis, Cardizem, and Coreg.  Asthma. -Continue home dose Dulera and albuterol nebulizer every 4 when necessary.  HLD  - Resume home Lipitor  GERD - Protonix 80 mg QD  Insomnia - Resume home Trazodone '50mg'$  QHS   Dispo: Discharge home today.   Lorella Nimrod, MD 08/13/2016, 9:42 AM Pager: [phone]

## 2016-08-13 NOTE — Progress Notes (Signed)
Internal Medicine Attending:   I saw and examined the patient. I reviewed the resident's note and I agree with the resident's findings and plan as documented in the resident's note. Patient wants to go home today for Xmas.  Reports doing very well, no complaints.  Vitals are stable with Tmax of 99.6, WBC trending down. We will switch her to oral bactrim which would have excellent coverage of MRSA as well as E Coli.  She will complete a 14 day course.  I have asked her to monitor for fever, increased redness or drainage at the site.  She will need to follow up with PCP and surgery.

## 2016-08-13 NOTE — Progress Notes (Signed)
Suzanne Macdonald to be D/C'd Home per MD order.  Discussed prescriptions and follow up appointments with the patient. Prescriptions given to patient, medication list explained in detail. Pt verbalized understanding.  Allergies as of 08/13/2016      Reactions   Augmentin [amoxicillin-pot Clavulanate] Diarrhea   Scallops [shellfish Allergy] Hives   Vancomycin Other (See Comments)   Pt began coughing and c/o tightening of chest/difficulty breathing and flushing during Vanc infusion.  Reports she never wants to have Vancomycin again even if it can be prevented with rate reduction   Barium-containing Compounds    Feels like cement in the body      Medication List    TAKE these medications   acetaminophen 500 MG tablet Commonly known as:  TYLENOL Take 1,000 mg by mouth every 6 (six) hours as needed for mild pain or moderate pain.   acetaminophen-codeine 300-30 MG tablet Commonly known as:  TYLENOL #3 Take 1-2 tablets by mouth every 4 (four) hours as needed for moderate pain.   albuterol 108 (90 Base) MCG/ACT inhaler Commonly known as:  PROVENTIL HFA;VENTOLIN HFA Inhale 1-2 puffs into the lungs every 6 (six) hours as needed for wheezing or shortness of breath.   ALIVE WOMENS GUMMY Chew Chew 2 each by mouth daily.   apixaban 5 MG Tabs tablet Commonly known as:  ELIQUIS Take 1 tablet (5 mg total) by mouth 2 (two) times daily.   atorvastatin 10 MG tablet Commonly known as:  LIPITOR take 1 tablet by mouth once daily   budesonide-formoterol 160-4.5 MCG/ACT inhaler Commonly known as:  SYMBICORT Inhale 2 puffs into the lungs 2 (two) times daily.   CALCIUM/VITAMIN D3/ADULT GUMMY PO Take 2 each by mouth daily.   carvedilol 12.5 MG tablet Commonly known as:  COREG Take 1 tablet (12.5 mg total) by mouth 2 (two) times daily with a meal.   cyanocobalamin 500 MCG tablet Take 1,000 mcg by mouth daily.   diltiazem 120 MG 24 hr capsule Commonly known as:  CARDIZEM CD Take 1 capsule (120  mg total) by mouth daily.   diltiazem 30 MG tablet Commonly known as:  CARDIZEM Take 1 tablet every 4 hours AS NEEDED for AFIB fast heart rate >100 as long as blood pressure >100.   docusate sodium 100 MG capsule Commonly known as:  COLACE Take 1 capsule (100 mg total) by mouth 2 (two) times daily as needed for mild constipation.   flecainide 100 MG tablet Commonly known as:  TAMBOCOR take 1 tablet by mouth twice a day   HYDROcodone-acetaminophen 5-325 MG tablet Commonly known as:  NORCO/VICODIN Take 1-2 tablets by mouth every 4 (four) hours as needed.   lisinopril 5 MG tablet Commonly known as:  PRINIVIL,ZESTRIL take 1 tablet by mouth once daily   loperamide 2 MG capsule Commonly known as:  IMODIUM Take 2 mg by mouth daily as needed for diarrhea or loose stools.   omeprazole 40 MG capsule Commonly known as:  PRILOSEC Take 1 capsule (40 mg total) by mouth daily.   potassium chloride 10 MEQ tablet Commonly known as:  K-DUR 2 TABLETS BY MOUTH DAILY   prochlorperazine 10 MG tablet Commonly known as:  COMPAZINE Take 10 mg by mouth every 6 (six) hours as needed for nausea or vomiting.   sulfamethoxazole-trimethoprim 800-160 MG tablet Commonly known as:  BACTRIM DS,SEPTRA DS Take 1 tablet by mouth 2 (two) times daily.   traZODone 100 MG tablet Commonly known as:  DESYREL Take 100 mg by mouth  at bedtime.   Vitamin D3 2000 units Chew Chew 2,000 Units by mouth 2 (two) times daily.       Vitals:   08/13/16 0510 08/13/16 0952  BP: (!) 153/66 (!) 133/49  Pulse: 68 63  Resp: 17 18  Temp: 99.6 F (37.6 C) 98.5 F (36.9 C)    Skin clean, dry and intact without evidence of skin break down, no evidence of skin tears noted. IV catheter discontinued intact. Site without signs and symptoms of complications. Dressing and pressure applied. Pt denies pain at this time. No complaints noted.  An After Visit Summary was printed and given to the patient. Patient escorted via [place],  and D/C home via private auto.  Dixie Dials RN, BSN

## 2016-08-16 LAB — CULTURE, BLOOD (ROUTINE X 2): Culture: NO GROWTH

## 2016-08-17 LAB — CULTURE, BLOOD (ROUTINE X 2): CULTURE: NO GROWTH

## 2016-08-23 ENCOUNTER — Inpatient Hospital Stay (HOSPITAL_COMMUNITY)
Admission: EM | Admit: 2016-08-23 | Discharge: 2016-08-27 | DRG: 580 | Disposition: A | Discharge status: Home Health | Payer: Medicare Other | Attending: Oncology | Admitting: Oncology

## 2016-08-23 ENCOUNTER — Encounter (HOSPITAL_COMMUNITY): Payer: Self-pay | Admitting: Emergency Medicine

## 2016-08-23 ENCOUNTER — Emergency Department (HOSPITAL_COMMUNITY): Payer: Medicare Other

## 2016-08-23 DIAGNOSIS — R229 Localized swelling, mass and lump, unspecified: Secondary | ICD-10-CM | POA: Diagnosis not present

## 2016-08-23 DIAGNOSIS — Y832 Surgical operation with anastomosis, bypass or graft as the cause of abnormal reaction of the patient, or of later complication, without mention of misadventure at the time of the procedure: Secondary | ICD-10-CM | POA: Diagnosis present

## 2016-08-23 DIAGNOSIS — Z79899 Other long term (current) drug therapy: Secondary | ICD-10-CM

## 2016-08-23 DIAGNOSIS — D509 Iron deficiency anemia, unspecified: Secondary | ICD-10-CM

## 2016-08-23 DIAGNOSIS — J45909 Unspecified asthma, uncomplicated: Secondary | ICD-10-CM | POA: Diagnosis present

## 2016-08-23 DIAGNOSIS — I10 Essential (primary) hypertension: Secondary | ICD-10-CM | POA: Diagnosis present

## 2016-08-23 DIAGNOSIS — K219 Gastro-esophageal reflux disease without esophagitis: Secondary | ICD-10-CM | POA: Diagnosis present

## 2016-08-23 DIAGNOSIS — T8579XA Infection and inflammatory reaction due to other internal prosthetic devices, implants and grafts, initial encounter: Secondary | ICD-10-CM | POA: Diagnosis present

## 2016-08-23 DIAGNOSIS — Z8249 Family history of ischemic heart disease and other diseases of the circulatory system: Secondary | ICD-10-CM | POA: Diagnosis not present

## 2016-08-23 DIAGNOSIS — B9689 Other specified bacterial agents as the cause of diseases classified elsewhere: Secondary | ICD-10-CM | POA: Diagnosis not present

## 2016-08-23 DIAGNOSIS — E785 Hyperlipidemia, unspecified: Secondary | ICD-10-CM | POA: Diagnosis present

## 2016-08-23 DIAGNOSIS — Z8541 Personal history of malignant neoplasm of cervix uteri: Secondary | ICD-10-CM

## 2016-08-23 DIAGNOSIS — L02211 Cutaneous abscess of abdominal wall: Secondary | ICD-10-CM | POA: Diagnosis present

## 2016-08-23 DIAGNOSIS — G4733 Obstructive sleep apnea (adult) (pediatric): Secondary | ICD-10-CM | POA: Diagnosis present

## 2016-08-23 DIAGNOSIS — L03311 Cellulitis of abdominal wall: Secondary | ICD-10-CM | POA: Diagnosis not present

## 2016-08-23 DIAGNOSIS — Z9049 Acquired absence of other specified parts of digestive tract: Secondary | ICD-10-CM

## 2016-08-23 DIAGNOSIS — B952 Enterococcus as the cause of diseases classified elsewhere: Secondary | ICD-10-CM | POA: Diagnosis present

## 2016-08-23 DIAGNOSIS — Z9689 Presence of other specified functional implants: Secondary | ICD-10-CM | POA: Diagnosis not present

## 2016-08-23 DIAGNOSIS — Z7901 Long term (current) use of anticoagulants: Secondary | ICD-10-CM

## 2016-08-23 DIAGNOSIS — Z90710 Acquired absence of both cervix and uterus: Secondary | ICD-10-CM | POA: Diagnosis not present

## 2016-08-23 DIAGNOSIS — Z91041 Radiographic dye allergy status: Secondary | ICD-10-CM

## 2016-08-23 DIAGNOSIS — Z87891 Personal history of nicotine dependence: Secondary | ICD-10-CM | POA: Diagnosis not present

## 2016-08-23 DIAGNOSIS — Z833 Family history of diabetes mellitus: Secondary | ICD-10-CM | POA: Diagnosis not present

## 2016-08-23 DIAGNOSIS — Z9221 Personal history of antineoplastic chemotherapy: Secondary | ICD-10-CM

## 2016-08-23 DIAGNOSIS — K5900 Constipation, unspecified: Secondary | ICD-10-CM | POA: Diagnosis present

## 2016-08-23 DIAGNOSIS — C189 Malignant neoplasm of colon, unspecified: Secondary | ICD-10-CM | POA: Diagnosis not present

## 2016-08-23 DIAGNOSIS — I480 Paroxysmal atrial fibrillation: Secondary | ICD-10-CM | POA: Diagnosis present

## 2016-08-23 DIAGNOSIS — I4892 Unspecified atrial flutter: Secondary | ICD-10-CM | POA: Diagnosis not present

## 2016-08-23 DIAGNOSIS — Z880 Allergy status to penicillin: Secondary | ICD-10-CM

## 2016-08-23 DIAGNOSIS — Z7951 Long term (current) use of inhaled steroids: Secondary | ICD-10-CM

## 2016-08-23 DIAGNOSIS — Z6841 Body Mass Index (BMI) 40.0 and over, adult: Secondary | ICD-10-CM

## 2016-08-23 DIAGNOSIS — Z91013 Allergy to seafood: Secondary | ICD-10-CM

## 2016-08-23 DIAGNOSIS — Z9884 Bariatric surgery status: Secondary | ICD-10-CM

## 2016-08-23 DIAGNOSIS — Z881 Allergy status to other antibiotic agents status: Secondary | ICD-10-CM

## 2016-08-23 HISTORY — DX: Other chronic pain: G89.29

## 2016-08-23 HISTORY — DX: Personal history of other medical treatment: Z92.89

## 2016-08-23 HISTORY — DX: Gastro-esophageal reflux disease without esophagitis: K21.9

## 2016-08-23 HISTORY — DX: Obstructive sleep apnea (adult) (pediatric): G47.33

## 2016-08-23 HISTORY — DX: Insomnia, unspecified: G47.00

## 2016-08-23 HISTORY — DX: Low back pain: M54.5

## 2016-08-23 HISTORY — DX: Unspecified osteoarthritis, unspecified site: M19.90

## 2016-08-23 HISTORY — DX: Low back pain, unspecified: M54.50

## 2016-08-23 HISTORY — DX: Pneumonia, unspecified organism: J18.9

## 2016-08-23 LAB — COMPREHENSIVE METABOLIC PANEL
ALT: 19 U/L (ref 14–54)
AST: 23 U/L (ref 15–41)
Albumin: 3.1 g/dL — ABNORMAL LOW (ref 3.5–5.0)
Alkaline Phosphatase: 77 U/L (ref 38–126)
Anion gap: 11 (ref 5–15)
BILIRUBIN TOTAL: 0.6 mg/dL (ref 0.3–1.2)
BUN: 12 mg/dL (ref 6–20)
CHLORIDE: 104 mmol/L (ref 101–111)
CO2: 22 mmol/L (ref 22–32)
CREATININE: 0.93 mg/dL (ref 0.44–1.00)
Calcium: 9.1 mg/dL (ref 8.9–10.3)
GFR calc Af Amer: >60 mL/min (ref >60)
Glucose, Bld: 98 mg/dL (ref 65–99)
Potassium: 4.5 mmol/L (ref 3.5–5.1)
Sodium: 137 mmol/L (ref 135–145)
Total Protein: 6.5 g/dL (ref 6.5–8.1)

## 2016-08-23 LAB — CBC WITH DIFFERENTIAL/PLATELET
Basophils Absolute: 0 10*3/uL (ref 0.0–0.1)
Basophils Relative: 0 %
EOS PCT: 1 %
Eosinophils Absolute: 0.1 10*3/uL (ref 0.0–0.7)
HEMATOCRIT: 31.6 % — AB (ref 36.0–46.0)
Hemoglobin: 9.4 g/dL — ABNORMAL LOW (ref 12.0–15.0)
Lymphocytes Relative: 16 %
Lymphs Abs: 2.3 10*3/uL (ref 0.7–4.0)
MCH: 20.3 pg — ABNORMAL LOW (ref 26.0–34.0)
MCHC: 29.7 g/dL — ABNORMAL LOW (ref 30.0–36.0)
MCV: 68.4 fL — AB (ref 78.0–100.0)
MONOS PCT: 6 %
Monocytes Absolute: 0.9 10*3/uL (ref 0.1–1.0)
NEUTROS PCT: 77 %
Neutro Abs: 11 10*3/uL — ABNORMAL HIGH (ref 1.7–7.7)
PLATELETS: 346 10*3/uL (ref 150–400)
RBC: 4.62 MIL/uL (ref 3.87–5.11)
RDW: 20.2 % — ABNORMAL HIGH (ref 11.5–15.5)
WBC: 14.3 10*3/uL — AB (ref 4.0–10.5)

## 2016-08-23 MED ORDER — ONDANSETRON HCL 4 MG/2ML IJ SOLN
4.0000 mg | Freq: Once | INTRAMUSCULAR | Status: AC
  Administered 2016-08-23: 4 mg via INTRAVENOUS

## 2016-08-23 MED ORDER — DOCUSATE SODIUM 100 MG PO CAPS
100.0000 mg | ORAL_CAPSULE | Freq: Two times a day (BID) | ORAL | Status: DC | PRN
  Administered 2016-08-23: 100 mg via ORAL
  Filled 2016-08-23 (×2): qty 1

## 2016-08-23 MED ORDER — IOPAMIDOL (ISOVUE-300) INJECTION 61%
INTRAVENOUS | Status: AC
  Administered 2016-08-23: 100 mL
  Filled 2016-08-23: qty 100

## 2016-08-23 MED ORDER — MOMETASONE FURO-FORMOTEROL FUM 200-5 MCG/ACT IN AERO
2.0000 | INHALATION_SPRAY | Freq: Two times a day (BID) | RESPIRATORY_TRACT | Status: DC
  Administered 2016-08-23 – 2016-08-27 (×8): 2 via RESPIRATORY_TRACT
  Filled 2016-08-23: qty 8.8

## 2016-08-23 MED ORDER — ONDANSETRON HCL 4 MG/2ML IJ SOLN
4.0000 mg | Freq: Four times a day (QID) | INTRAMUSCULAR | Status: DC | PRN
  Administered 2016-08-25: 4 mg via INTRAVENOUS
  Filled 2016-08-23: qty 2

## 2016-08-23 MED ORDER — ATORVASTATIN CALCIUM 10 MG PO TABS
10.0000 mg | ORAL_TABLET | Freq: Every day | ORAL | Status: DC
  Administered 2016-08-25 – 2016-08-27 (×3): 10 mg via ORAL
  Filled 2016-08-23 (×4): qty 1

## 2016-08-23 MED ORDER — FLECAINIDE ACETATE 100 MG PO TABS
100.0000 mg | ORAL_TABLET | Freq: Two times a day (BID) | ORAL | Status: DC
  Administered 2016-08-23 – 2016-08-27 (×8): 100 mg via ORAL
  Filled 2016-08-23 (×10): qty 1

## 2016-08-23 MED ORDER — PIPERACILLIN-TAZOBACTAM 3.375 G IVPB
3.3750 g | Freq: Three times a day (TID) | INTRAVENOUS | Status: DC
  Administered 2016-08-23 – 2016-08-26 (×9): 3.375 g via INTRAVENOUS
  Filled 2016-08-23 (×10): qty 50

## 2016-08-23 MED ORDER — CLINDAMYCIN PHOSPHATE 600 MG/50ML IV SOLN
600.0000 mg | Freq: Once | INTRAVENOUS | Status: AC
  Administered 2016-08-23: 600 mg via INTRAVENOUS
  Filled 2016-08-23: qty 50

## 2016-08-23 MED ORDER — ONDANSETRON HCL 4 MG PO TABS: 4.0000 mg | ORAL_TABLET | Freq: Four times a day (QID) | ORAL | Status: DC | PRN

## 2016-08-23 MED ORDER — SODIUM CHLORIDE 0.9 % IV BOLUS (SEPSIS)
1000.0000 mL | Freq: Once | INTRAVENOUS | Status: AC
  Administered 2016-08-23: 1000 mL via INTRAVENOUS

## 2016-08-23 MED ORDER — FENTANYL CITRATE (PF) 100 MCG/2ML IJ SOLN
50.0000 ug | Freq: Once | INTRAMUSCULAR | Status: AC
  Administered 2016-08-23: 50 ug via INTRAVENOUS
  Filled 2016-08-23: qty 2

## 2016-08-23 MED ORDER — PANTOPRAZOLE SODIUM 40 MG PO TBEC
40.0000 mg | DELAYED_RELEASE_TABLET | Freq: Every day | ORAL | Status: DC
  Administered 2016-08-23 – 2016-08-27 (×4): 40 mg via ORAL
  Filled 2016-08-23 (×4): qty 1

## 2016-08-23 MED ORDER — ONDANSETRON HCL 4 MG/2ML IJ SOLN
INTRAMUSCULAR | Status: AC
  Filled 2016-08-23: qty 2

## 2016-08-23 MED ORDER — OXYCODONE HCL 5 MG PO TABS
5.0000 mg | ORAL_TABLET | ORAL | Status: DC | PRN
  Administered 2016-08-23 – 2016-08-27 (×17): 10 mg via ORAL
  Filled 2016-08-23 (×11): qty 2
  Filled 2016-08-23: qty 1
  Filled 2016-08-23 (×4): qty 2
  Filled 2016-08-23: qty 1
  Filled 2016-08-23: qty 2

## 2016-08-23 MED ORDER — ALBUTEROL SULFATE (2.5 MG/3ML) 0.083% IN NEBU
2.5000 mg | INHALATION_SOLUTION | Freq: Four times a day (QID) | RESPIRATORY_TRACT | Status: DC | PRN
Start: 2016-08-23 — End: 2016-08-27

## 2016-08-23 MED ORDER — TRAZODONE HCL 100 MG PO TABS
100.0000 mg | ORAL_TABLET | Freq: Every evening | ORAL | Status: DC | PRN
  Administered 2016-08-23: 100 mg via ORAL
  Filled 2016-08-23: qty 1

## 2016-08-23 MED ORDER — HYDROMORPHONE HCL 2 MG/ML IJ SOLN
1.0000 mg | Freq: Once | INTRAMUSCULAR | Status: AC
  Administered 2016-08-23: 1 mg via INTRAVENOUS
  Filled 2016-08-23: qty 1

## 2016-08-23 NOTE — ED Notes (Signed)
Patient transported to CT 

## 2016-08-23 NOTE — ED Provider Notes (Signed)
[facility] DEPT Provider Note   CSN: [id number] Arrival date & time: 08/23/16  7829     History   Chief Complaint Chief Complaint  Patient presents with  . Abscess    HPI Suzanne Macdonald is a 66 y.o. female.  HPI  66 year old female with extensive past medical history listed below most pertinent for recent abdominal wall abscess following hernia repair in September. Patient had percutaneous drain placed and was recently removed approximately 4 weeks ago. Patient presented to the emergency department on December 23 for persistent abdominal wall pain and noted to have cellulitic changes with small intra-abdominal wall abscess that failed outpatient management. Patient was admitted and treated with IV antibiotics. She was discharged December 25 on Bactrim. Since then patient reports that the pain has persisted and in the last several days has worsened. This morning when she awoke she noted a 2 cm abscess on the right lateral abdominal wall. She endorses persistent fevers with Tmax: 99.2 while on scheduled tylenol. She denies any nausea, vomiting, diarrhea. No urinary symptoms. Denies any current respiratory complaints.   Past Medical History:  Diagnosis Date  . Asthma   . Atrial fibrillation ([place])   . Cervical cancer ([place]) 1972  . Colon cancer ([place])   . H/O gastric bypass   . Hyperlipidemia   . Hypertension   . Morbid obesity ([place]) 03/04/2016  . Sleep apnea     Patient Active Problem List   Diagnosis Date Noted  . Abdominal wall cellulitis 08/11/2016  . Atrial flutter ([place]) 07/12/2016  . Atrial fibrillation with RVR ([place]) 07/11/2016  . Fever   . At risk for surgical site infection   . Gram-negative infection   . Abdominal wall abscess 06/10/2016  . Aortic atherosclerosis ([place]) 05/09/2016  . Morbid obesity ([place]) 05/04/2016  . Incarcerated hernia 04/28/2016  . Osteopenia 03/15/2016  . Atrial fibrillation with tachycardic ventricular rate ([place])   . Asthma 03/02/2016  .  Prediabetes 01/25/2016  . Essential hypertension 01/25/2016  . Vitamin D deficiency 01/25/2016  . 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 Vitamin B12 deficiency 01/25/2016  . Paroxysmal atrial fibrillation ([place]) 01/25/2016  . Health care maintenance 01/25/2016  . History of colon cancer 01/25/2016  . Ventral hernia 01/25/2016  . History of cervical cancer 01/25/2016    Past Surgical History:  Procedure Laterality Date  . ABDOMINAL HYSTERECTOMY  1998   Salpingoophorectomy. For tumorous growth.  . BOWEL RESECTION  2015   [facility], [place] by Dr. Stormy Fabian  . CARDIAC CATHETERIZATION  05/2014   in [place], no sig CAD (done prior to surgery for colon CA)  . GASTRIC BYPASS  1992  . INSERTION OF MESH N/A 05/01/2016   Procedure: INSERTION OF MESH;  Surgeon: Excell Seltzer, MD;  Location: [facility];  Service: General;  Laterality: N/A;  . IR GENERIC HISTORICAL  06/28/2016   IR RADIOLOGIST EVAL & MGMT 06/28/2016 Markus Daft, MD [facility] INTERV RAD  . LAPAROSCOPIC GASTRIC BANDING     Placed 2013 and removed in 2014.  Marland Kitchen VENTRAL HERNIA REPAIR N/A 05/01/2016   Procedure: VENTRAL HERNIA REPAIR;  Surgeon: Excell Seltzer, MD;  Location: [facility];  Service: General;  Laterality: N/A;    OB History    No data available       Home Medications    Prior to Admission medications   Medication Sig Start Date End Date Taking? Authorizing Provider  acetaminophen (TYLENOL) 500 MG tablet Take 1,000 mg by mouth every 6 (six) hours as needed for  mild pain or moderate pain.   Yes Historical Provider, MD  acetaminophen-codeine (TYLENOL #3) 300-30 MG tablet Take 1-2 tablets by mouth every 4 (four) hours as needed for moderate pain. 08/13/16  Yes Lorella Nimrod, MD  albuterol (PROVENTIL HFA;VENTOLIN HFA) 108 (90 Base) MCG/ACT inhaler Inhale 1-2 puffs into the lungs every 6 (six) hours as needed for wheezing or shortness of breath.   Yes Historical Provider, MD  apixaban (ELIQUIS) 5 MG TABS tablet Take 1 tablet (5 mg  total) by mouth 2 (two) times daily. 03/13/16  Yes Shela Leff, MD  atorvastatin (LIPITOR) 10 MG tablet take 1 tablet by mouth once daily 04/04/16  Yes Shela Leff, MD  budesonide-formoterol [facility]) 160-4.5 MCG/ACT inhaler Inhale 2 puffs into the lungs 2 (two) times daily. 01/24/16  Yes Milagros Loll, MD  Calcium-Phosphorus-Vitamin D (CALCIUM/VITAMIN D3/ADULT GUMMY PO) Take 2 each by mouth daily.   Yes Historical Provider, MD  carvedilol (COREG) 12.5 MG tablet Take 1 tablet (12.5 mg total) by mouth 2 (two) times daily with a meal. 07/23/16  Yes Rhonda G Barrett, PA-C  Cholecalciferol (VITAMIN D3) 2000 units CHEW Chew 2,000 Units by mouth 2 (two) times daily.   Yes Historical Provider, MD  cyanocobalamin 500 MCG tablet Take 1,000 mcg by mouth daily.    Yes Historical Provider, MD  diltiazem (CARDIZEM CD) 120 MG 24 hr capsule Take 1 capsule (120 mg total) by mouth daily. 07/13/16  Yes Alphonzo Grieve, MD  diltiazem (CARDIZEM) 30 MG tablet Take 1 tablet every 4 hours AS NEEDED for AFIB fast heart rate >100 as long as blood pressure >100. 07/30/16  Yes Sherran Needs, NP  docusate sodium (COLACE) 100 MG capsule Take 1 capsule (100 mg total) by mouth 2 (two) times daily as needed for mild constipation. 06/14/16  Yes Ledell Noss, MD  flecainide (TAMBOCOR) 100 MG tablet take 1 tablet by mouth twice a day 08/06/16  Yes [name], MD  lisinopril (PRINIVIL,ZESTRIL) 5 MG tablet take 1 tablet by mouth once daily 07/27/16  Yes [name], MD  loperamide (IMODIUM) 2 MG capsule Take 2 mg by mouth daily as needed for diarrhea or loose stools.   Yes Historical Provider, MD  Multiple Vitamins-Minerals (ALIVE WOMENS GUMMY) CHEW Chew 2 each by mouth daily.   Yes Historical Provider, MD  omeprazole (PRILOSEC) 40 MG capsule Take 1 capsule (40 mg total) by mouth daily. 04/27/16  Yes [name], MD  potassium chloride (K-DUR) 10 MEQ tablet 2 TABLETS BY MOUTH DAILY 06/27/16  Yes [name], MD   prochlorperazine (COMPAZINE) 10 MG tablet Take 10 mg by mouth every 6 (six) hours as needed for nausea or vomiting.   Yes Historical Provider, MD  traZODone (DESYREL) 100 MG tablet Take 100 mg by mouth at bedtime. 04/22/16  Yes Historical Provider, MD  HYDROcodone-acetaminophen (NORCO/VICODIN) 5-325 MG tablet Take 1-2 tablets by mouth every 4 (four) hours as needed. Patient not taking: Reported on 08/23/2016 08/10/16   Delos Haring, PA-C  sulfamethoxazole-trimethoprim (BACTRIM DS,SEPTRA DS) 800-160 MG tablet Take 1 tablet by mouth 2 (two) times daily. Patient not taking: Reported on 08/23/2016 08/13/16 08/25/16  Lorella Nimrod, MD    Family History Family History  Problem Relation Age of Onset  . Heart disease Mother   . Diabetes Mother   . Heart disease Father   . Stroke Father   . Diabetes Father   . Diabetes Sister   . Thyroid disease Sister   . Early death Brother  Killed by drunk driver  . Heart attack Brother   . Thyroid disease Daughter     s/p thyroidectomy  . Diabetes Sister   . Thyroid disease Sister   . Stroke Brother   . Drug abuse Brother   . Heart attack Brother     Social History Social History  Substance Use Topics  . Smoking status: Former Smoker    Years: 0.20    Types: Cigarettes  . Smokeless tobacco: Never Used  . Alcohol use No     Comment: a beer every now and then     Allergies   Augmentin [amoxicillin-pot clavulanate]; Scallops [shellfish allergy]; Vancomycin; and Barium-containing compounds   Review of Systems Review of Systems Ten systems are reviewed and are negative for acute change except as noted in the HPI   Physical Exam Updated Vital Signs BP (!) 141/46 (BP Location: Right Arm)   Pulse (!) 50   Temp 98 F (36.7 C) (Oral)   SpO2 100%   Physical Exam  Constitutional: She is oriented to person, place, and time. She appears well-developed and well-nourished. No distress.  HENT:  Head: Normocephalic and atraumatic.  Nose: Nose  normal.  Eyes: Conjunctivae and EOM are normal. Pupils are equal, round, and reactive to light. Right eye exhibits no discharge. Left eye exhibits no discharge. No scleral icterus.  Neck: Normal range of motion. Neck supple.  Cardiovascular: Normal rate and regular rhythm.  Exam reveals no gallop and no friction rub.   No murmur heard. Pulmonary/Chest: Effort normal and breath sounds normal. No stridor. No respiratory distress. She has no rales.  Abdominal: Soft. She exhibits no distension. There is tenderness (surrounding the abscess and erythema.  otherwise abd nontender).    Musculoskeletal: She exhibits no edema or tenderness.  Neurological: She is alert and oriented to person, place, and time.  Skin: Skin is warm and dry. No rash noted. She is not diaphoretic. No erythema.  Psychiatric: She has a normal mood and affect.  Vitals reviewed.    ED Treatments / Results  Labs (all labs ordered are listed, but only abnormal results are displayed) Labs Reviewed  CBC WITH DIFFERENTIAL/PLATELET - Abnormal; Notable for the following:       Result Value   WBC 14.3 (*)    Hemoglobin 9.4 (*)    HCT 31.6 (*)    MCV 68.4 (*)    MCH 20.3 (*)    MCHC 29.7 (*)    RDW 20.2 (*)    Neutro Abs 11.0 (*)    All other components within normal limits  COMPREHENSIVE METABOLIC PANEL - Abnormal; Notable for the following:    Albumin 3.1 (*)    All other components within normal limits  BASIC METABOLIC PANEL  CBC    EKG  EKG Interpretation None       Radiology Ct Abdomen Pelvis W Contrast  Result Date: 08/23/2016 CLINICAL DATA:  Abdominal wall abscess EXAM: CT ABDOMEN AND PELVIS WITH CONTRAST TECHNIQUE: Multidetector CT imaging of the abdomen and pelvis was performed using the standard protocol following bolus administration of intravenous contrast. CONTRAST:  161m ISOVUE-300 IOPAMIDOL (ISOVUE-300) INJECTION 61% COMPARISON:  08/10/2016 FINDINGS: Lower chest: Lung bases are clear. No effusions.  Heart is normal size. Hepatobiliary: No focal hepatic abnormality. Gallbladder unremarkable. Pancreas: No focal abnormality or ductal dilatation. Spleen: No focal abnormality.  Normal size. Adrenals/Urinary Tract: No adrenal abnormality. No focal renal abnormality. No stones or hydronephrosis. Urinary bladder is unremarkable. Stomach/Bowel: Postoperative changes in the sigmoid  colon and stomach. Moderate to large stool burden in the colon. No evidence of bowel obstruction. Vascular/Lymphatic: Scattered aortic calcifications. No aneurysm or adenopathy. Reproductive: Prior hysterectomy.  No adnexal masses. Other: No free fluid or free air. Enlarging fluid collection in the right lateral abdominal wall, measuring 6.5 x 4.8 cm currently, compared with 2.4 x 2.1 cm previously. Locules of gas are noted within this. Findings compatible with enlarging subcutaneous abscess. Surrounding inflammatory stranding. Previously seen midline ventral wall fluid collection no longer visible. Musculoskeletal: No acute bony abnormality or focal bone lesion. IMPRESSION: Enlarging subcutaneous right lateral abdominal wall abscess, now 6.5 x 4.8 cm. Moderate to large stool burden in the colon. Postoperative changes in the sigmoid colon and stomach. Electronically Signed   By: Rolm Baptise M.D.   On: 08/23/2016 14:49    Procedures Procedures (including critical care time) Emergency Ultrasound Study:   Angiocath insertion Performed by: Grayce Sessions Manolito Jurewicz  Consent: Verbal consent obtained. Risks and benefits: risks, benefits and alternatives were discussed Immediately prior to procedure the correct patient, procedure, equipment, support staff and site/side marked as needed.  Indication: difficult IV access Preparation: Patient and probe were prepped with chlorhexidine or alcohol. Sterile gel used. Vein Location: left median vein was visualized during assessment for potential access sites and was found to be patent/ easily  compressed with linear ultrasound.  1.88 inch, 20 gauge needle was visualized with real-time ultrasound and guided into the vein.  Image saved and stored.  Normal blood return.  Patient tolerance: Patient tolerated the procedure well with no immediate complications.     Medications Ordered in ED Medications  ondansetron (ZOFRAN) 4 MG/2ML injection (not administered)  ondansetron (ZOFRAN) tablet 4 mg (not administered)    Or  ondansetron (ZOFRAN) injection 4 mg (not administered)  oxyCODONE (Oxy IR/ROXICODONE) immediate release tablet 5-10 mg (not administered)  fentaNYL (SUBLIMAZE) injection 50 mcg (50 mcg Intravenous Given 08/23/16 1338)  sodium chloride 0.9 % bolus 1,000 mL (0 mLs Intravenous Stopped 08/23/16 1508)  ondansetron (ZOFRAN) injection 4 mg (4 mg Intravenous Given 08/23/16 1348)  iopamidol (ISOVUE-300) 61 % injection (100 mLs  Contrast Given 08/23/16 1406)  clindamycin (CLEOCIN) IVPB 600 mg (0 mg Intravenous Stopped 08/23/16 1620)  HYDROmorphone (DILAUDID) injection 1 mg (1 mg Intravenous Given 08/23/16 1508)  HYDROmorphone (DILAUDID) injection 1 mg (1 mg Intravenous Given 08/23/16 1658)     Initial Impression / Assessment and Plan / ED Course  I have reviewed the triage vital signs and the nursing notes.  Pertinent labs & imaging results that were available during my care of the patient were reviewed by me and considered in my medical decision making (see chart for details).  Clinical Course     Workup consistent for failed outpatient management of abdominal wall abscess. Patient started on clindamycin IV. Surgery consulted who will follow along with the patient. Patient admitted to the internal medicine teaching service for continued management.   Final Clinical Impressions(s) / ED Diagnoses   Final diagnoses:  Abdominal wall abscess      Fatima Blank, MD 08/23/16 [phone]

## 2016-08-23 NOTE — Consult Note (Signed)
[facility] Surgery Consult Note  Suzanne Macdonald 10/02/1950  [id number].    Requesting MD: Leonette Monarch, MD Chief Complaint/Reason for Consult: post-operative abdominal wall abscess HPI:  66 year-old female with a medical history significant for hypertension, hyperlipidemia, afib on eliquis, asthma, OSA, and most recently an abdominal wall abscess s/p ventral hernia repair 05/06/16 by Dr. Excell Seltzer. Patient developed a post-operative abscess 06/09/16 requiring admission, IR percutaneous drainage, and antibiotic treatment. The abscess improved and the drain was removed 08/07/16. On 08/11/16 the patient was re-admitted to the hospital for IV antibiotics for treatment of abdominal wall cellulitis around previous drain site. She was discharged home 12/26 on PO Bactrim and returns today due to a new, superficial abdominal wall abscess. She c/o severe abdominal pain and worsening redness. She denies chills, CP, SOB, changes in bowel habits, or urinary symptoms. She denies vomiting, melena, or hematemesis. She has a history of multiple previous abdominal surgeries including open gastric byppass, infected lap band placement with removal, partial colectomy, and multiple ventral hernia repairs with mesh.  She is allergic to amoxicillin, barium, and shellfish. 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  ED workup: WBC 14.3, hgb 9.4/hct 31.6, LFT's are WNL. CT abd/pelvis pending  ROS: Review of Systems  Constitutional: Negative for chills and fever.  Respiratory: Negative for cough, shortness of breath and wheezing.   Cardiovascular: Negative for chest pain and palpitations.  Gastrointestinal: Positive for abdominal pain and blood in stool. Negative for constipation, melena, nausea and vomiting.  Genitourinary: Negative for dysuria and urgency.   Family History  Problem Relation Age of Onset  . Heart disease Mother   . Diabetes Mother   . Heart disease Father   . Stroke  Father   . Diabetes Father   . Diabetes Sister   . Thyroid disease Sister   . Early death Brother     Killed by drunk driver  . Heart attack Brother   . Thyroid disease Daughter     s/p thyroidectomy  . Diabetes Sister   . Thyroid disease Sister   . Stroke Brother   . Drug abuse Brother   . Heart attack Brother     Past Medical History:  Diagnosis Date  . Asthma   . Atrial fibrillation ([place])   . Cervical cancer ([place]) 1972  . Colon cancer ([place])   . H/O gastric bypass   . Hyperlipidemia   . Hypertension   . Morbid obesity ([place]) 03/04/2016  . Sleep apnea     Past Surgical History:  Procedure Laterality Date  . ABDOMINAL HYSTERECTOMY  1998   Salpingoophorectomy. For tumorous growth.  . BOWEL RESECTION  2015   [place], [place] by Dr. Stormy Fabian  . CARDIAC CATHETERIZATION  05/2014   in [place], no sig CAD (done prior to surgery for colon CA)  . GASTRIC BYPASS  1992  . INSERTION OF MESH N/A 05/01/2016   Procedure: INSERTION OF MESH;  Surgeon: Excell Seltzer, MD;  Location: [facility];  Service: General;  Laterality: N/A;  . IR GENERIC HISTORICAL  06/28/2016   IR RADIOLOGIST EVAL & MGMT 06/28/2016 Markus Daft, MD [facility] INTERV RAD  . LAPAROSCOPIC GASTRIC BANDING     Placed 2013 and removed in 2014.  Marland Kitchen VENTRAL HERNIA REPAIR N/A 05/01/2016   Procedure: VENTRAL HERNIA REPAIR;  Surgeon: Excell Seltzer, MD;  Location: [facility];  Service: General;  Laterality: N/A;    Social History:  reports that she has quit smoking. Her smoking use included Cigarettes.  She quit after 0.20 years of use. She has never used smokeless tobacco. She reports that she does not drink alcohol or use drugs.  Allergies:  Allergies  Allergen Reactions  . Augmentin [Amoxicillin-Pot Clavulanate] Diarrhea  . Scallops [Shellfish Allergy] Hives  . Vancomycin Other (See Comments)    Pt began coughing and c/o tightening of chest/difficulty breathing and flushing during Vanc infusion.  Reports she never wants to have  Vancomycin again even if it can be prevented with rate reduction  . Barium-Containing Compounds     Feels like cement in the body     (Not in a hospital admission)  Blood pressure 144/61, pulse (!) 55, temperature 98 F (36.7 C), temperature source Oral, resp. rate 16, SpO2 100 %. Physical Exam: General: pleasant, obese white female who is laying in bed in mild distress HEENT: head is normocephalic, atraumatic.  Sclera are noninjected.  PERRL. Mouth is pink and moist Heart: regular, rate, and rhythm.  No obvious murmurs, gallops, or rubs noted.  Palpable pedal pulses bilaterally Lungs:  Respiratory effort nonlabored Abd: soft, TTP RUQ with guarding, RLQ with small, 1-2 cm abscess and 8-10 cm surrounding erythema - the area is tender to light touch, +BS, multiple abdominal scars present, reducible umbilical hernia present. MS: all 4 extremities are symmetrical Skin: warm and dry with no masses or rashes Psych: A&Ox3 with an appropriate affect. Neuro: extremity CSM intact bilaterally, normal speech  Results for orders placed or performed during the hospital encounter of 08/23/16 (from the past 48 hour(s))  CBC with Differential     Status: Abnormal (Preliminary result)   Collection Time: 08/23/16 12:49 PM  Result Value Ref Range   WBC 14.3 (H) 4.0 - 10.5 K/uL   RBC 4.62 3.87 - 5.11 MIL/uL   Hemoglobin 9.4 (L) 12.0 - 15.0 g/dL   HCT 31.6 (L) 36.0 - 46.0 %   MCV 68.4 (L) 78.0 - 100.0 fL   MCH 20.3 (L) 26.0 - 34.0 pg   MCHC 29.7 (L) 30.0 - 36.0 g/dL   RDW 20.2 (H) 11.5 - 15.5 %   Platelets 346 150 - 400 K/uL   Neutrophils Relative % PENDING %   Neutro Abs PENDING 1.7 - 7.7 K/uL   Band Neutrophils PENDING %   Lymphocytes Relative PENDING %   Lymphs Abs PENDING 0.7 - 4.0 K/uL   Monocytes Relative PENDING %   Monocytes Absolute PENDING 0.1 - 1.0 K/uL   Eosinophils Relative PENDING %   Eosinophils Absolute PENDING 0.0 - 0.7 K/uL   Basophils Relative PENDING %   Basophils Absolute  PENDING 0.0 - 0.1 K/uL   WBC Morphology PENDING    RBC Morphology PENDING    Smear Review PENDING    nRBC PENDING 0 /100 WBC   Metamyelocytes Relative PENDING %   Myelocytes PENDING %   Promyelocytes Absolute PENDING %   Blasts PENDING %  Comprehensive metabolic panel     Status: Abnormal (Preliminary result)   Collection Time: 08/23/16 12:49 PM  Result Value Ref Range   Sodium 137 135 - 145 mmol/L   Potassium 4.5 3.5 - 5.1 mmol/L   Chloride 104 101 - 111 mmol/L   CO2 22 22 - 32 mmol/L   Glucose, Bld 98 65 - 99 mg/dL   BUN 12 6 - 20 mg/dL   Creatinine, Ser 0.93 0.44 - 1.00 mg/dL   Calcium 9.1 8.9 - 10.3 mg/dL   Total Protein 6.5 6.5 - 8.1 g/dL   Albumin 3.1 (L)  3.5 - 5.0 g/dL   AST 23 15 - 41 U/L   ALT 19 14 - 54 U/L   Alkaline Phosphatase 77 38 - 126 U/L   Total Bilirubin PENDING 0.3 - 1.2 mg/dL   GFR calc non Af Amer >60 >60 mL/min   GFR calc Af Amer >60 >60 mL/min    Comment: (NOTE) The eGFR has been calculated using the CKD EPI equation. This calculation has not been validated in all clinical situations. eGFR's persistently <60 mL/min signify possible Chronic Kidney Disease.    Anion gap 11 5 - 15   No results found.  Assessment/Plan Abdominal wall abscess, recurrent S/p exploratory laparotomy, ventral hernia repair with mesh 05/01/16 Dr. Excell Seltzer S/p percutaneous drainage of abdominal wall abscess 06/11/16, removal 08/07/16  CT abdomen/pelvis to evaluate for a deeper fluid collection  NPO, IVF, Empiric antibiotics for abdominal wall cellulitis and abscess    Plan: CT scan abdomen pelvis, IV abx. Further surgical plans to follow CT results   Jill Alexanders, [facility] Surgery 08/23/2016, 1:52 PM Pager: [phone] Consults: [phone] Mon-Fri 7:00 am-4:30 pm Sat-Sun 7:00 am-11:30 am

## 2016-08-23 NOTE — ED Notes (Signed)
Admitting at bedside 

## 2016-08-23 NOTE — H&P (Signed)
Date: 08/23/2016               Patient Name:  Suzanne Macdonald MRN: [id number]  DOB: 05-04-1951 Age / Sex: 66 y.o., female   PCP: Bartholomew Crews, MD         Medical Service: Internal Medicine Teaching Service         Attending Physician: Dr. Annia Belt, MD    First Contact: Dr. Hetty Ely Pager: [phone]  Second Contact: Dr. Juleen China  Pager: [phone]       After Hours (After 5p/  First Contact Pager: [phone]  weekends / holidays): Second Contact Pager: [phone]   Chief Complaint: red, hot, painful area of swelling on stomach   History of Present Illness: [name] is a 66 y.o. female with a PMH of afib on eliquis, morbid obesity, OSA, colon cancer s/p resection, gastric bypass s/p repair of incarcerated incisional hernia 04/2016 who presents with abdominal swelling. She was admitted 10/21-10/26 with post operative abdominal wall abscess, IR placed a drain and cultures were sent and grew ecoli and small number of budding yeast she was discharged with IV ceftriaxone and fluconazole. At follow with IR the abscess was found to have no communication with her gut. She was admitted 12/23- 12/25 for purulent abdominal wall cellulitis, was treated with zosyn and clindamycin and discharged on a 2 week course of Bactrim. Since the admission the area of swelling over the right side of her stomach her stomach has been erythematous and warm to touch. She feels as though the area has been expanding with increased pain over the area. She has had chills and night sweats but has been taking her temperature at home and had tmax 99 degrees.   In the ED she was afebrile with vital signs were stable. Labs revealed WBC 14.3, Hgb 9.4. CT abdomen showed a 6.5 x 4.8 cm abdominal wall abscess. Was given a dose of clindamycin, fentanyl 50 mg, dilaudid 1 mg x2, zofran, protonix and admitted for surgical evaluation and observation.  Meds:  Current Meds  Medication Sig  . acetaminophen (TYLENOL) 500 MG  tablet Take 1,000 mg by mouth every 6 (six) hours as needed for mild pain or moderate pain.  Marland Kitchen acetaminophen-codeine (TYLENOL #3) 300-30 MG tablet Take 1-2 tablets by mouth every 4 (four) hours as needed for moderate pain.  Marland Kitchen albuterol (PROVENTIL HFA;VENTOLIN HFA) 108 (90 Base) MCG/ACT inhaler Inhale 1-2 puffs into the lungs every 6 (six) hours as needed for wheezing or shortness of breath.  Marland Kitchen apixaban (ELIQUIS) 5 MG TABS tablet Take 1 tablet (5 mg total) by mouth 2 (two) times daily.  Marland Kitchen atorvastatin (LIPITOR) 10 MG tablet take 1 tablet by mouth once daily  . budesonide-formoterol (SYMBICORT) 160-4.5 MCG/ACT inhaler Inhale 2 puffs into the lungs 2 (two) times daily.  . Calcium-Phosphorus-Vitamin D (CALCIUM/VITAMIN D3/ADULT GUMMY PO) Take 2 each by mouth daily.  . carvedilol (COREG) 12.5 MG tablet Take 1 tablet (12.5 mg total) by mouth 2 (two) times daily with a meal.  . Cholecalciferol (VITAMIN D3) 2000 units CHEW Chew 2,000 Units by mouth 2 (two) times daily.  . cyanocobalamin 500 MCG tablet Take 1,000 mcg by mouth daily.   Marland Kitchen diltiazem (CARDIZEM CD) 120 MG 24 hr capsule Take 1 capsule (120 mg total) by mouth daily.  Marland Kitchen diltiazem (CARDIZEM) 30 MG tablet Take 1 tablet every 4 hours AS NEEDED for AFIB fast heart rate >100 as long as blood pressure >100.  Marland Kitchen docusate  sodium (COLACE) 100 MG capsule Take 1 capsule (100 mg total) by mouth 2 (two) times daily as needed for mild constipation.  . flecainide (TAMBOCOR) 100 MG tablet take 1 tablet by mouth twice a day  . lisinopril (PRINIVIL,ZESTRIL) 5 MG tablet take 1 tablet by mouth once daily  . loperamide (IMODIUM) 2 MG capsule Take 2 mg by mouth daily as needed for diarrhea or loose stools.  . Multiple Vitamins-Minerals (ALIVE WOMENS GUMMY) CHEW Chew 2 each by mouth daily.  Marland Kitchen omeprazole (PRILOSEC) 40 MG capsule Take 1 capsule (40 mg total) by mouth daily.  . potassium chloride (K-DUR) 10 MEQ tablet 2 TABLETS BY MOUTH DAILY  . prochlorperazine (COMPAZINE)  10 MG tablet Take 10 mg by mouth every 6 (six) hours as needed for nausea or vomiting.  . traZODone (DESYREL) 100 MG tablet Take 100 mg by mouth at bedtime.     Allergies: Allergies as of 08/23/2016 - Review Complete 08/23/2016  Allergen Reaction Noted  . Augmentin [amoxicillin-pot clavulanate] Diarrhea 07/11/2016  . Scallops [shellfish allergy] Hives 06/09/2016  . Vancomycin Other (See Comments) 08/11/2016  . Barium-containing compounds  07/30/2016   Past Medical History:  Diagnosis Date  . Arthritis    "throughout my body" (08/23/2016)  . Asthma   . Atrial fibrillation ([place])   . Cervical cancer ([place]) 1972  . Chronic lower back pain   . Colon cancer ([place]) 2015  . GERD (gastroesophageal reflux disease)   . H/O gastric bypass   . History of blood transfusion 1972; 1990s   "both w/OR"  . Hyperlipidemia   . Hypertension   . Insomnia   . Morbid obesity ([place]) 03/04/2016  . OSA (obstructive sleep apnea)    "can't afford the machine" (08/23/2016)  . Pneumonia    "probably 4 times in early childhood" (08/23/2016)   Family History:  Brother - atrial fibrillation   Social History:  Has a 20 pack year smoking history, quit smoking 15 years ago. Denies alcohol or illicit drug use.   Review of Systems: A complete ROS was negative except as per HPI.   Physical Exam: Blood pressure (!) 132/54, pulse (!) 54, temperature 98.9 F (37.2 C), temperature source Oral, resp. rate 18, SpO2 99 %. Physical Exam  Constitutional: She is oriented to person, place, and time. She appears well-developed and well-nourished. No distress.  HENT:  Head: Normocephalic and atraumatic.  Eyes: Conjunctivae are normal. No scleral icterus.  Cardiovascular: Normal rate and regular rhythm.   No murmur heard. Pulmonary/Chest: Effort normal. No respiratory distress. She has no wheezes. She has no rales.  Abdominal: Soft. She exhibits no distension. There is no guarding.  Delayed bowel sounds  Area of erythema  and warmth to palpation over right abdomen central outpouching with thin skin overlying visible fluid collection   Neurological: She is alert and oriented to person, place, and time.  Skin: Skin is warm and dry. She is not diaphoretic.  Psychiatric: She has a normal mood and affect. Her behavior is normal.    CT abdomen: Personal review of the abdominal CT reveals an abdominal wall abscess over her right mid abdominal wall.   Assessment & Plan by Problem: Active Problems:   Abdominal wall abscess 66 year old woman with history of multiple abdominal surgeries initially related to colon cancer and gastric bypass complicated by incisional hernias. Most recent abdominal surgery was exploratory laparotomy with ventral hernia repair with mesh 05/01/16 which has resulted in a non healing abdominal wall abscess which initially grew e.coli  and small numbers of yeast on cultures. The cause of this abscess reoccurring/ not clearing is unclear. She is afebrile at this time with leukocytosis. She has been on trial of bactrim since 12/24 and infection has not resolved so this infection may not be related to MRSA. Surgery was consulted they are considering I&D in the OR. -ordered Zosyn per pharmacy consult -follow up morning CBC   -oxy IR 5-10 mg q4h PRN  for pain  Essential Hypertension  Currently normotensive. Will restart home medication lisinopril if she becomes hypertensive.   Paroxysmal atrial fibrillation  She is in NSR on exam.  - holding home medication eliquis for surgical evaluation  -holding home medications cardizem and carvedilol as she is normotensive  - Continue home medication flecainide  Asthma  No wheeze on exam.  - continue home medication dulera inhaler BID and albuterol nebs PRN   GERD  -continue home medication protonix 40 mg qd   HLD  -continue home medication atorvastatin 10 mg qd   N Heart healthy diet  DVT Ppx SCDs  Code Status FULL   Dispo: Admit patient to  Inpatient with expected length of stay greater than 2 midnights.  Signed: Ledell Noss, MD 08/23/2016, 7:31 PM  Pager: [phone]

## 2016-08-23 NOTE — ED Notes (Signed)
Attempted to give report 

## 2016-08-23 NOTE — ED Notes (Signed)
Attempted IV X 2 with no success, attempted to access port with no success.

## 2016-08-23 NOTE — ED Notes (Signed)
Provider at bedside

## 2016-08-23 NOTE — Progress Notes (Signed)
Pharmacy Antibiotic Note  Suzanne Macdonald is a 66 y.o. female admitted on 08/23/2016 with abdominal abscess.  Pharmacy has been consulted for Zosyn dosing.  Plan: Zosyn 3.375g IV q8h (4 hour infusion). Monitor renal function, clinical status, and culture results.     Temp (24hrs), Avg:98.5 F (36.9 C), Min:98 F (36.7 C), Max:98.9 F (37.2 C)   Recent Labs Lab 08/23/16 1249  WBC 14.3*  CREATININE 0.93    Estimated Creatinine Clearance: 77.8 mL/min (by C-G formula based on SCr of 0.93 mg/dL).    Allergies  Allergen Reactions  . Augmentin [Amoxicillin-Pot Clavulanate] Diarrhea  . Scallops [Shellfish Allergy] Hives  . Vancomycin Other (See Comments)    Pt began coughing and c/o tightening of chest/difficulty breathing and flushing during Vanc infusion.  Reports she never wants to have Vancomycin again even if it can be prevented with rate reduction  . Barium-Containing Compounds     Feels like cement in the body    Antimicrobials this admission: Zosyn 1/4 >>  Dose adjustments this admission:   Microbiology results: none  Thank you for allowing pharmacy to be a part of this patient's care.  Sloan Leiter, PharmD, BCPS Clinical Pharmacist [phone] until 11 PM tonight [phone] after hours 08/23/2016 6:54 PM

## 2016-08-23 NOTE — ED Triage Notes (Signed)
Patient presents today with Abscess  On the ABD. Patient states she notice some drainage today, patient states she is on ABX at home. Patient alert and oriented x4.  Redness noted Tender to touch. Warm to Touch.

## 2016-08-24 ENCOUNTER — Encounter (HOSPITAL_COMMUNITY)
Admission: EM | Disposition: A | Discharge status: Home Health | Payer: Self-pay | Source: Home / Self Care | Attending: Oncology

## 2016-08-24 ENCOUNTER — Inpatient Hospital Stay (HOSPITAL_COMMUNITY): Payer: Medicare Other | Admitting: Certified Registered Nurse Anesthetist

## 2016-08-24 ENCOUNTER — Encounter: Payer: Self-pay | Admitting: General Surgery

## 2016-08-24 DIAGNOSIS — Z91041 Radiographic dye allergy status: Secondary | ICD-10-CM

## 2016-08-24 DIAGNOSIS — Z8619 Personal history of other infectious and parasitic diseases: Secondary | ICD-10-CM

## 2016-08-24 DIAGNOSIS — I10 Essential (primary) hypertension: Secondary | ICD-10-CM

## 2016-08-24 DIAGNOSIS — I480 Paroxysmal atrial fibrillation: Secondary | ICD-10-CM

## 2016-08-24 DIAGNOSIS — Z9884 Bariatric surgery status: Secondary | ICD-10-CM

## 2016-08-24 DIAGNOSIS — J45909 Unspecified asthma, uncomplicated: Secondary | ICD-10-CM

## 2016-08-24 DIAGNOSIS — Z9049 Acquired absence of other specified parts of digestive tract: Secondary | ICD-10-CM

## 2016-08-24 DIAGNOSIS — L03311 Cellulitis of abdominal wall: Secondary | ICD-10-CM

## 2016-08-24 DIAGNOSIS — D509 Iron deficiency anemia, unspecified: Secondary | ICD-10-CM

## 2016-08-24 DIAGNOSIS — Z8719 Personal history of other diseases of the digestive system: Secondary | ICD-10-CM

## 2016-08-24 DIAGNOSIS — B9689 Other specified bacterial agents as the cause of diseases classified elsewhere: Secondary | ICD-10-CM

## 2016-08-24 DIAGNOSIS — Z7901 Long term (current) use of anticoagulants: Secondary | ICD-10-CM

## 2016-08-24 DIAGNOSIS — Z881 Allergy status to other antibiotic agents status: Secondary | ICD-10-CM

## 2016-08-24 DIAGNOSIS — C189 Malignant neoplasm of colon, unspecified: Secondary | ICD-10-CM

## 2016-08-24 DIAGNOSIS — Z7951 Long term (current) use of inhaled steroids: Secondary | ICD-10-CM

## 2016-08-24 DIAGNOSIS — L02211 Cutaneous abscess of abdominal wall: Principal | ICD-10-CM

## 2016-08-24 DIAGNOSIS — Z79899 Other long term (current) drug therapy: Secondary | ICD-10-CM

## 2016-08-24 DIAGNOSIS — Z91013 Allergy to seafood: Secondary | ICD-10-CM

## 2016-08-24 DIAGNOSIS — Z6841 Body Mass Index (BMI) 40.0 and over, adult: Secondary | ICD-10-CM

## 2016-08-24 DIAGNOSIS — Z872 Personal history of diseases of the skin and subcutaneous tissue: Secondary | ICD-10-CM

## 2016-08-24 DIAGNOSIS — Z87891 Personal history of nicotine dependence: Secondary | ICD-10-CM

## 2016-08-24 DIAGNOSIS — E785 Hyperlipidemia, unspecified: Secondary | ICD-10-CM

## 2016-08-24 DIAGNOSIS — K219 Gastro-esophageal reflux disease without esophagitis: Secondary | ICD-10-CM

## 2016-08-24 DIAGNOSIS — Z9221 Personal history of antineoplastic chemotherapy: Secondary | ICD-10-CM

## 2016-08-24 HISTORY — PX: VENTRAL HERNIA REPAIR: SHX424

## 2016-08-24 LAB — BASIC METABOLIC PANEL
ANION GAP: 8 (ref 5–15)
BUN: 14 mg/dL (ref 6–20)
CHLORIDE: 104 mmol/L (ref 101–111)
CO2: 23 mmol/L (ref 22–32)
CREATININE: 1.04 mg/dL — AB (ref 0.44–1.00)
Calcium: 8.5 mg/dL — ABNORMAL LOW (ref 8.9–10.3)
GFR calc non Af Amer: 55 mL/min — ABNORMAL LOW (ref >60)
Glucose, Bld: 108 mg/dL — ABNORMAL HIGH (ref 65–99)
Potassium: 4.2 mmol/L (ref 3.5–5.1)
SODIUM: 135 mmol/L (ref 135–145)

## 2016-08-24 LAB — FERRITIN: FERRITIN: 43 ng/mL (ref 11–307)

## 2016-08-24 LAB — CBC
HCT: 28 % — ABNORMAL LOW (ref 36.0–46.0)
Hemoglobin: 8.4 g/dL — ABNORMAL LOW (ref 12.0–15.0)
MCH: 20 pg — ABNORMAL LOW (ref 26.0–34.0)
MCHC: 30 g/dL (ref 30.0–36.0)
MCV: 66.8 fL — AB (ref 78.0–100.0)
PLATELETS: 323 10*3/uL (ref 150–400)
RBC: 4.19 MIL/uL (ref 3.87–5.11)
RDW: 20 % — ABNORMAL HIGH (ref 11.5–15.5)
WBC: 10.2 10*3/uL (ref 4.0–10.5)

## 2016-08-24 LAB — SURGICAL PCR SCREEN
MRSA, PCR: NEGATIVE
Staphylococcus aureus: NEGATIVE

## 2016-08-24 SURGERY — REPAIR, HERNIA, VENTRAL
Anesthesia: General | Site: Abdomen

## 2016-08-24 MED ORDER — SUCCINYLCHOLINE CHLORIDE 20 MG/ML IJ SOLN
INTRAMUSCULAR | Status: DC | PRN
  Administered 2016-08-24: 100 mg via INTRAVENOUS

## 2016-08-24 MED ORDER — ROCURONIUM BROMIDE 50 MG/5ML IV SOSY
PREFILLED_SYRINGE | INTRAVENOUS | Status: AC
  Filled 2016-08-24: qty 10

## 2016-08-24 MED ORDER — APIXABAN 5 MG PO TABS
5.0000 mg | ORAL_TABLET | Freq: Two times a day (BID) | ORAL | Status: DC
  Administered 2016-08-24 – 2016-08-27 (×6): 5 mg via ORAL
  Filled 2016-08-24 (×6): qty 1

## 2016-08-24 MED ORDER — FENTANYL CITRATE (PF) 100 MCG/2ML IJ SOLN
INTRAMUSCULAR | Status: AC
  Filled 2016-08-24: qty 2

## 2016-08-24 MED ORDER — OXYCODONE HCL 5 MG/5ML PO SOLN: 5.0000 mg | Freq: Once | ORAL | Status: AC | PRN

## 2016-08-24 MED ORDER — HYDROMORPHONE HCL 1 MG/ML IJ SOLN
0.5000 mg | Freq: Once | INTRAMUSCULAR | Status: AC
  Administered 2016-08-24: 0.5 mg via INTRAVENOUS

## 2016-08-24 MED ORDER — FERROUS SULFATE 325 (65 FE) MG PO TABS
325.0000 mg | ORAL_TABLET | Freq: Every day | ORAL | Status: DC
  Administered 2016-08-25 – 2016-08-27 (×3): 325 mg via ORAL
  Filled 2016-08-24 (×3): qty 1

## 2016-08-24 MED ORDER — CARVEDILOL 12.5 MG PO TABS
12.5000 mg | ORAL_TABLET | Freq: Two times a day (BID) | ORAL | Status: DC
  Administered 2016-08-24 – 2016-08-27 (×6): 12.5 mg via ORAL
  Filled 2016-08-24 (×6): qty 1

## 2016-08-24 MED ORDER — SODIUM CHLORIDE 0.9% FLUSH
10.0000 mL | INTRAVENOUS | Status: DC | PRN
  Administered 2016-08-27: 10 mL
  Filled 2016-08-24: qty 40

## 2016-08-24 MED ORDER — SUGAMMADEX SODIUM 200 MG/2ML IV SOLN
INTRAVENOUS | Status: AC
  Filled 2016-08-24: qty 2

## 2016-08-24 MED ORDER — ONDANSETRON HCL 4 MG/2ML IJ SOLN
INTRAMUSCULAR | Status: AC
  Filled 2016-08-24: qty 6

## 2016-08-24 MED ORDER — DEXAMETHASONE SODIUM PHOSPHATE 10 MG/ML IJ SOLN
INTRAMUSCULAR | Status: AC
  Filled 2016-08-24: qty 2

## 2016-08-24 MED ORDER — CALCIUM CHLORIDE 10 % IV SOLN
INTRAVENOUS | Status: AC
  Filled 2016-08-24: qty 10

## 2016-08-24 MED ORDER — LIDOCAINE 2% (20 MG/ML) 5 ML SYRINGE
INTRAMUSCULAR | Status: AC
Start: 2016-08-24 — End: 2016-08-24
  Filled 2016-08-24: qty 10

## 2016-08-24 MED ORDER — PHENYLEPHRINE 40 MCG/ML (10ML) SYRINGE FOR IV PUSH (FOR BLOOD PRESSURE SUPPORT)
PREFILLED_SYRINGE | INTRAVENOUS | Status: AC
  Filled 2016-08-24: qty 10

## 2016-08-24 MED ORDER — LISINOPRIL 5 MG PO TABS
5.0000 mg | ORAL_TABLET | Freq: Every day | ORAL | Status: DC
  Administered 2016-08-24 – 2016-08-27 (×4): 5 mg via ORAL
  Filled 2016-08-24 (×4): qty 1

## 2016-08-24 MED ORDER — HYDROMORPHONE HCL 1 MG/ML IJ SOLN
INTRAMUSCULAR | Status: AC
  Filled 2016-08-24: qty 0.5

## 2016-08-24 MED ORDER — SODIUM CHLORIDE 0.9 % IR SOLN
Status: DC | PRN
  Administered 2016-08-24: 1000 mL
  Administered 2016-08-24: 3000 mL

## 2016-08-24 MED ORDER — OXYCODONE HCL 5 MG PO TABS
5.0000 mg | ORAL_TABLET | Freq: Once | ORAL | Status: AC | PRN
  Administered 2016-08-24: 5 mg via ORAL

## 2016-08-24 MED ORDER — PROPOFOL 10 MG/ML IV BOLUS
INTRAVENOUS | Status: DC | PRN
  Administered 2016-08-24: 130 mg via INTRAVENOUS

## 2016-08-24 MED ORDER — LIDOCAINE-EPINEPHRINE (PF) 1 %-1:200000 IJ SOLN
INTRAMUSCULAR | Status: AC
  Filled 2016-08-24: qty 30

## 2016-08-24 MED ORDER — BUPIVACAINE HCL (PF) 0.25 % IJ SOLN
INTRAMUSCULAR | Status: AC
  Filled 2016-08-24: qty 30

## 2016-08-24 MED ORDER — SUCCINYLCHOLINE CHLORIDE 200 MG/10ML IV SOSY
PREFILLED_SYRINGE | INTRAVENOUS | Status: AC
  Filled 2016-08-24: qty 10

## 2016-08-24 MED ORDER — MIDAZOLAM HCL 2 MG/2ML IJ SOLN
INTRAMUSCULAR | Status: AC
  Filled 2016-08-24: qty 2

## 2016-08-24 MED ORDER — OXYCODONE HCL 5 MG PO TABS
ORAL_TABLET | ORAL | Status: AC
  Filled 2016-08-24: qty 1

## 2016-08-24 MED ORDER — FENTANYL CITRATE (PF) 100 MCG/2ML IJ SOLN
INTRAMUSCULAR | Status: DC | PRN
  Administered 2016-08-24: 50 ug via INTRAVENOUS

## 2016-08-24 MED ORDER — FENTANYL CITRATE (PF) 100 MCG/2ML IJ SOLN
25.0000 ug | INTRAMUSCULAR | Status: DC | PRN
  Administered 2016-08-24 (×3): 50 ug via INTRAVENOUS

## 2016-08-24 MED ORDER — DILTIAZEM HCL ER COATED BEADS 120 MG PO CP24
120.0000 mg | ORAL_CAPSULE | Freq: Every day | ORAL | Status: DC
  Administered 2016-08-24 – 2016-08-27 (×4): 120 mg via ORAL
  Filled 2016-08-24 (×6): qty 1

## 2016-08-24 MED ORDER — ONDANSETRON HCL 4 MG/2ML IJ SOLN: 4.0000 mg | Freq: Once | INTRAMUSCULAR | Status: DC | PRN

## 2016-08-24 MED ORDER — LACTATED RINGERS IV SOLN
INTRAVENOUS | Status: DC
  Administered 2016-08-24 (×2): via INTRAVENOUS

## 2016-08-24 MED ORDER — LIDOCAINE HCL (CARDIAC) 20 MG/ML IV SOLN
INTRAVENOUS | Status: DC | PRN
  Administered 2016-08-24: 60 mg via INTRAVENOUS

## 2016-08-24 MED ORDER — ONDANSETRON HCL 4 MG/2ML IJ SOLN
INTRAMUSCULAR | Status: DC | PRN
Start: 2016-08-24 — End: 2016-08-24
  Administered 2016-08-24: 4 mg via INTRAVENOUS

## 2016-08-24 MED ORDER — MIDAZOLAM HCL 5 MG/5ML IJ SOLN
INTRAMUSCULAR | Status: DC | PRN
  Administered 2016-08-24: 2 mg via INTRAVENOUS

## 2016-08-24 MED ORDER — ONDANSETRON HCL 4 MG/2ML IJ SOLN
INTRAMUSCULAR | Status: AC
  Filled 2016-08-24: qty 2

## 2016-08-24 MED ORDER — PROPOFOL 10 MG/ML IV BOLUS
INTRAVENOUS | Status: AC
  Filled 2016-08-24: qty 20

## 2016-08-24 MED ORDER — 0.9 % SODIUM CHLORIDE (POUR BTL) OPTIME
TOPICAL | Status: DC | PRN
  Administered 2016-08-24: 1000 mL

## 2016-08-24 SURGICAL SUPPLY — 38 items
BLADE SURG ROTATE 9660 (MISCELLANEOUS) IMPLANT
BNDG GAUZE ELAST 4 BULKY (GAUZE/BANDAGES/DRESSINGS) ×3 IMPLANT
CANISTER SUCTION 2500CC (MISCELLANEOUS) ×6 IMPLANT
CHLORAPREP W/TINT 26ML (MISCELLANEOUS) IMPLANT
COVER SURGICAL LIGHT HANDLE (MISCELLANEOUS) ×3 IMPLANT
DRAIN PENROSE 1/2X12 LTX STRL (WOUND CARE) ×3 IMPLANT
DRAIN PENROSE 1/4X12 LTX STRL (WOUND CARE) IMPLANT
DRAPE UTILITY XL STRL (DRAPES) IMPLANT
DRSG PAD ABDOMINAL 8X10 ST (GAUZE/BANDAGES/DRESSINGS) ×3 IMPLANT
ELECT CAUTERY BLADE 6.4 (BLADE) ×3 IMPLANT
ELECT REM PT RETURN 9FT ADLT (ELECTROSURGICAL) ×3
ELECTRODE REM PT RTRN 9FT ADLT (ELECTROSURGICAL) ×1 IMPLANT
GAUZE SPONGE 4X4 12PLY STRL (GAUZE/BANDAGES/DRESSINGS) ×3 IMPLANT
GLOVE BIO SURGEON STRL SZ 6 (GLOVE) ×3 IMPLANT
GLOVE BIO SURGEON STRL SZ 6.5 (GLOVE) ×2 IMPLANT
GLOVE BIO SURGEONS STRL SZ 6.5 (GLOVE) ×1
GLOVE BIOGEL PI IND STRL 6.5 (GLOVE) ×1 IMPLANT
GLOVE BIOGEL PI IND STRL 7.0 (GLOVE) ×1 IMPLANT
GLOVE BIOGEL PI INDICATOR 6.5 (GLOVE) ×2
GLOVE BIOGEL PI INDICATOR 7.0 (GLOVE) ×2
GOWN STRL REUS W/ TWL LRG LVL3 (GOWN DISPOSABLE) ×2 IMPLANT
GOWN STRL REUS W/TWL 2XL LVL3 (GOWN DISPOSABLE) ×3 IMPLANT
GOWN STRL REUS W/TWL LRG LVL3 (GOWN DISPOSABLE) ×4
HANDPIECE INTERPULSE COAX TIP (DISPOSABLE) ×2
KIT BASIN OR (CUSTOM PROCEDURE TRAY) ×3 IMPLANT
KIT ROOM TURNOVER OR (KITS) ×3 IMPLANT
NEEDLE HYPO 25GX1X1/2 BEV (NEEDLE) IMPLANT
NS IRRIG 1000ML POUR BTL (IV SOLUTION) ×3 IMPLANT
PACK GENERAL/GYN (CUSTOM PROCEDURE TRAY) ×3 IMPLANT
PAD ARMBOARD 7.5X6 YLW CONV (MISCELLANEOUS) ×3 IMPLANT
SET HNDPC FAN SPRY TIP SCT (DISPOSABLE) ×1 IMPLANT
SUT ETHILON 2 0 FS 18 (SUTURE) ×3 IMPLANT
SUT MNCRL AB 4-0 PS2 18 (SUTURE) IMPLANT
SUT NOVA NAB DX-16 0-1 5-0 T12 (SUTURE) IMPLANT
TOWEL OR 17X24 6PK STRL BLUE (TOWEL DISPOSABLE) ×6 IMPLANT
TOWEL OR 17X26 10 PK STRL BLUE (TOWEL DISPOSABLE) ×3 IMPLANT
TRAY FOLEY CATH 14FRSI W/METER (CATHETERS) IMPLANT
WATER STERILE IRR 1000ML POUR (IV SOLUTION) IMPLANT

## 2016-08-24 NOTE — Transfer of Care (Signed)
Immediate Anesthesia Transfer of Care Note  Patient: Suzanne Macdonald  Procedure(s) Performed: Procedure(s): IRRIGATION AND DEBRIDEMENT OF ABDOMINAL WALL ABCESS (N/A)  Patient Location: PACU  Anesthesia Type:General  Level of Consciousness: awake, alert , oriented, patient cooperative and responds to stimulation  Airway & Oxygen Therapy: Patient Spontanous Breathing and Patient connected to nasal cannula oxygen  Post-op Assessment: Report given to RN and Post -op Vital signs reviewed and stable  Post vital signs: Reviewed and stable  Last Vitals:  Vitals:   08/24/16 0900 08/24/16 1156  BP: (!) 141/42 (!) 178/74  Pulse: 62 73  Resp:    Temp: 36.8 C 37.1 C    Last Pain:  Vitals:   08/24/16 1156  TempSrc: Oral  PainSc:       Patients Stated Pain Goal: 2 (44/73/95 8441)  Complications: No apparent anesthesia complications

## 2016-08-24 NOTE — Op Note (Signed)
Incision and Drainage complex abdominal wall abscess  Pre-operative Diagnosis: right abdominal wall abscess   Post-operative Diagnosis: same   Indications: incompletely drained large abdominal wall abscess, s/p recurrent abscess following perc drain.    Anesthesia: General   Surgeon:  Stark Klein, MD  Asst:  Jackson Latino, PA-C  Procedure Details  The procedure, risks and complications have been discussed in detail (including, infection, bleeding, need for additional procedures) with the patient, and the patient has signed consent to the procedure. The patient was informed that the wound would be left open.  The patient was taken to OR 1 and general anesthesia was induced. The patient was placed into supine position.  The skin was sterilely prepped and draped over the affected area in the usual fashion. Time out was performed according to the surgical safety checklist.   The previous drain site  was opened with a #10 blade. An ellipse of skin was taken. Cultures were sent. Additional skin was debrided around the opening. Purulent drainage was present. The pulse lavage was used to irrigate the cavity. A 1/2 " penrose drain was placed and secured with 2-0 nylon.  The wound was packed with moist kerlix.  The patient was awakened from anesthesia and taken to the PACU in stable condition.   Findings:  Large multiloculated abscess with thick walls and septations, around 5 cm.   EBL: 25 mL   Drains: None   Condition: Tolerated procedure well   Complications:  none known.

## 2016-08-24 NOTE — Anesthesia Preprocedure Evaluation (Signed)
Anesthesia Evaluation  Patient identified by MRN, date of birth, ID band Patient awake    Reviewed: Allergy & Precautions, NPO status , Patient's Chart, lab work & pertinent test results  Airway Mallampati: II  TM Distance: >3 FB Neck ROM: Full    Dental  (+) Edentulous Upper, Edentulous Lower   Pulmonary former smoker,    breath sounds clear to auscultation       Cardiovascular hypertension,  Rhythm:Regular Rate:Normal     Neuro/Psych    GI/Hepatic   Endo/Other    Renal/GU      Musculoskeletal   Abdominal (+) + obese,   Peds  Hematology   Anesthesia Other Findings   Reproductive/Obstetrics                             Anesthesia Physical Anesthesia Plan  ASA: III  Anesthesia Plan: General   Post-op Pain Management:    Induction:   Airway Management Planned: Oral ETT  Additional Equipment:   Intra-op Plan:   Post-operative Plan: Extubation in OR  Informed Consent: I have reviewed the patients History and Physical, chart, labs and discussed the procedure including the risks, benefits and alternatives for the proposed anesthesia with the patient or authorized representative who has indicated his/her understanding and acceptance.     Plan Discussed with: CRNA and Anesthesiologist  Anesthesia Plan Comments:         Anesthesia Quick Evaluation

## 2016-08-24 NOTE — Anesthesia Postprocedure Evaluation (Signed)
Anesthesia Post Note  Patient: Suzanne Macdonald  Procedure(s) Performed: Procedure(s) (LRB): IRRIGATION AND DEBRIDEMENT OF ABDOMINAL WALL ABCESS (N/A)  Patient location during evaluation: PACU Anesthesia Type: General Level of consciousness: awake Pain management: pain level controlled Respiratory status: spontaneous breathing Cardiovascular status: stable Anesthetic complications: no       Last Vitals:  Vitals:   08/24/16 1452 08/24/16 1500  BP: (!) 144/55 (!) 129/55  Pulse: 69 65  Resp: 16 14  Temp: 36.8 C 36.8 C    Last Pain:  Vitals:   08/24/16 1628  TempSrc:   PainSc: 2                  Kahleel Fadeley

## 2016-08-24 NOTE — Progress Notes (Signed)
  Subjective: [name] feels that after her abscess ruptured and there was relief pressure but she continues to feel pain in that area. The was afebrile overnight and has been nothing by mouth in preparation for surgery today.  Objective:  Vital signs in last 24 hours: Vitals:   08/24/16 1425 08/24/16 1440 08/24/16 1452 08/24/16 1500  BP: (!) 157/64 (!) 159/63 (!) 144/55 (!) 129/55  Pulse: 69 69 69 65  Resp: '12 13 16 14  '$ Temp:   98.3 F (36.8 C) 98.3 F (36.8 C)  TempSrc:    Oral  SpO2: 95% 99% 95% 97%  Weight:      Height:       Physical Exam  Constitutional: She is oriented to person, place, and time. She appears well-developed and well-nourished. No distress.  HENT:  Head: Normocephalic and atraumatic.  Eyes: No scleral icterus.  Cardiovascular: Normal rate and regular rhythm.   No murmur heard. Pulmonary/Chest: Effort normal and breath sounds normal. No respiratory distress.  Abdominal: Soft. She exhibits no distension. There is tenderness. There is no guarding.  There is a open hole over the abscess which is covered with pain and dry dressing. Tenderness to palpation only over right lower quadrant abscess.  Neurological: She is alert and oriented to person, place, and time.  Skin: Skin is warm and dry. She is not diaphoretic.     Assessment/Plan: Pt is a 66 y.o. yo female with a PMHx of afib on eliquis, morbid obesity, OSA, colon cancer s/p resection, gastric bypass s/p repair of incarcerated incisional hernia 04/2016 who was admitted on 08/23/2016 with symptoms of abdominal wall abscess.  Principal Problem:   Abdominal wall abscess Active Problems:   Essential hypertension   Paroxysmal atrial fibrillation (HCC)   Morbid obesity (HCC)   Iron deficiency anemia  Abdominal wall abscess She was taken for drainage and debridement of abdominal wall abscess in the operating room today. She remains afebrile. It still remains unclear why this abscess has not healed and  continues to recur. May be related to poor blood flow and therefore antibiotic penetration given location of her abscess. -Continue Zosyn  Essential hypertension Currently hypertensive - Restarted home medications lisinopril, carvedilol, and diltiazem   Paroxysmal atrial fibrillation Currently in NSR. CHADSVASC = 3  - Restarted home medications eliquis, cardizem, and carvedilol - continue home medication flecanide   Iron deficiency anemia On admission she had hemoglobin 9.4 with baseline 8-11. MCV 68 RDW 20 and ferritin 43. This iron deficiency may be related to anemia of chronic disease and her history of multiple recent surgeries. - Started ferrous sulfate tablet supplementation  GERD  - continue home medication protonix 40 mg qd  Asthma -continue home medication dulera inhaler BID and albuterol nebs PRN   Dispo: Anticipated discharge in approximately 1-2 day(s).   Ledell Noss, MD 08/24/2016, 5:03 PM Pager: [phone]

## 2016-08-25 ENCOUNTER — Encounter (HOSPITAL_COMMUNITY): Payer: Self-pay | Admitting: General Surgery

## 2016-08-25 MED ORDER — DOCUSATE SODIUM 100 MG PO CAPS
100.0000 mg | ORAL_CAPSULE | Freq: Two times a day (BID) | ORAL | Status: DC
  Administered 2016-08-25 – 2016-08-26 (×4): 100 mg via ORAL
  Filled 2016-08-25 (×3): qty 1

## 2016-08-25 NOTE — Progress Notes (Signed)
Medicine attending: Clinical status and ancillary data reviewed in detail with resident physician Dr. Jule Ser and I concur with his evaluation and management plan. She was taken to surgery yesterday for incision, drainage, and evacuation of a large recurrent, abdominal wall abscess. Findings were a large multiloculated abscess with thick walls and septations around 5 cm. Surgical drain left in place. Patient tolerated the procedure well. No new lab values today. She remains afebrile on Zosyn. Apixiban and anticoagulation resumed.

## 2016-08-25 NOTE — Progress Notes (Signed)
  Subjective: Suzanne Macdonald feels better this morning.  Pain is doing okay - rated as 7/10, but better than prior to admission.  She had some back pain this morning relieved with pain medication.  Has not had BM since surgery but urinating without issue.  Objective:  Vital signs in last 24 hours: Vitals:   08/24/16 1452 08/24/16 1500 08/24/16 2037 08/25/16 0551  BP: (!) 144/55 (!) 129/55 (!) 116/47 (!) 124/55  Pulse: 69 65 63 65  Resp: '16 14 15 18  '$ Temp: 98.3 F (36.8 C) 98.3 F (36.8 C) 98.2 F (36.8 C) 98.3 F (36.8 C)  TempSrc:  Oral Oral Oral  SpO2: 95% 97% 95% 94%  Weight:      Height:       Physical Exam  Constitutional: She is oriented to person, place, and time. She appears well-developed and well-nourished. No distress.  HENT:  Head: Normocephalic and atraumatic.  Eyes: No scleral icterus.  Cardiovascular: Normal rate and regular rhythm.   No murmur heard. Pulmonary/Chest: Effort normal.  Abdominal: Soft. She exhibits no distension. There is tenderness. There is no guarding.  There is a large bandage over her surgical site with serosanguinous drainage.    Neurological: She is alert and oriented to person, place, and time.  Skin: Skin is warm and dry. She is not diaphoretic.     Assessment/Plan: Pt is a 66 y.o. yo female with a PMHx of afib on eliquis, morbid obesity, OSA, colon cancer s/p resection, gastric bypass s/p repair of incarcerated incisional hernia 04/2016 who was admitted on 08/23/2016 with symptoms of abdominal wall abscess.  Principal Problem:   Abdominal wall abscess Active Problems:   Essential hypertension   Paroxysmal atrial fibrillation (HCC)   Morbid obesity (HCC)   Iron deficiency anemia  Abdominal wall abscess She was taken for drainage and debridement of abdominal wall abscess in the operating room 08/25/2015.  Remains afebrile.  Leukocytosis from yesterday improved.  Intraoperative cultures are currently pending.  -Continue Zosyn for  now  Essential hypertension BP this morning is 124/55 - Continue home medications lisinopril, carvedilol, and diltiazem   Paroxysmal atrial fibrillation Currently in NSR. CHADSVASC = 3  - continue home medications eliquis, cardizem, and carvedilol - continue home medication flecanide   Iron deficiency anemia On admission she had hemoglobin 9.4 with baseline 8-11. MCV 68 RDW 20 and ferritin 43. This iron deficiency may be related to anemia of chronic disease and her history of multiple recent surgeries. - Started ferrous sulfate tablet supplementation - Follow hemoglobin  Dispo: Anticipated discharge in approximately 1-2 day(s).   Jule Ser, DO 08/25/2016, 8:55 AM Pager: [phone]

## 2016-08-25 NOTE — Progress Notes (Signed)
1 Day Post-Op  Subjective: Alert.  Stable.  Afebrile.  Heart rate 65. Having pain at right upper quadrant wound site Cultures immature.  A few gram-positive cocci noted. Objective: Vital signs in last 24 hours: Temp:  [98.2 F (36.8 C)-98.8 F (37.1 C)] 98.3 F (36.8 C) (01/06 0551) Pulse Rate:  [63-73] 65 (01/06 0551) Resp:  [12-19] 18 (01/06 0551) BP: (116-178)/(47-74) 124/55 (01/06 0551) SpO2:  [94 %-100 %] 94 % (01/06 0551) Last BM Date: 08/24/16  Intake/Output from previous day: 01/05 0701 - 01/06 0700 In: 806.5 [P.O.:480; I.V.:176.5; IV Piggyback:150] Out: 1650 [Urine:1650] Intake/Output this shift: Total I/O In: 130 [P.O.:130] Out: -   General appearance: Alert.  Mild distress.  Grumpy due to pain and dressing change.  Cooperative. GI: Abdomen is obese.  Soft.  Right upper quadrant wound examined.  Packing removed and Penrose left in place.  No bleeding.  No odor.  No purulence  Repacked loosely.  Lab Results:  Results for orders placed or performed during the hospital encounter of 08/23/16 (from the past 24 hour(s))  Aerobic Culture (superficial specimen)     Status: None (Preliminary result)   Collection Time: 08/24/16  1:28 PM  Result Value Ref Range   Specimen Description ABSCESS ABDOMEN    Special Requests POF CLEOCIN AND ZOSYN    Gram Stain      RARE WBC PRESENT, PREDOMINANTLY PMN RARE GRAM POSITIVE COCCI IN PAIRS    Culture CULTURE REINCUBATED FOR BETTER GROWTH    Report Status PENDING      Studies/Results: No results found.  Marland Kitchen apixaban  5 mg Oral BID  . atorvastatin  10 mg Oral Daily  . carvedilol  12.5 mg Oral BID WC  . diltiazem  120 mg Oral Daily  . ferrous sulfate  325 mg Oral Q breakfast  . flecainide  100 mg Oral BID  . lisinopril  5 mg Oral Daily  . mometasone-formoterol  2 puff Inhalation BID  . pantoprazole  40 mg Oral Daily  . piperacillin-tazobactam (ZOSYN)  IV  3.375 g Intravenous Q8H     Assessment/Plan: s/p  Procedure(s): IRRIGATION AND DEBRIDEMENT OF ABDOMINAL WALL ABCESS  Abdominal wall abscess. POD #1 exploration and drainage and debridement abdominal wall abscess.  Seems to be adequately drained. Begin daily wound care Leave Penrose in place Continue Zosyn pending culture report  History ventral hernia repair with mesh.  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  Paroxysmal atrial fibrillation-on eloquence, Cardizem, carvedilol, flecainide Obesity Hypertension  '@PROBHOSP'$ @  LOS: 2 days    Suzanne Macdonald M 08/25/2016  . .prob

## 2016-08-26 DIAGNOSIS — D509 Iron deficiency anemia, unspecified: Secondary | ICD-10-CM

## 2016-08-26 DIAGNOSIS — Z9889 Other specified postprocedural states: Secondary | ICD-10-CM

## 2016-08-26 DIAGNOSIS — Z9689 Presence of other specified functional implants: Secondary | ICD-10-CM

## 2016-08-26 DIAGNOSIS — B952 Enterococcus as the cause of diseases classified elsewhere: Secondary | ICD-10-CM

## 2016-08-26 LAB — BASIC METABOLIC PANEL
ANION GAP: 10 (ref 5–15)
BUN: 15 mg/dL (ref 6–20)
CO2: 24 mmol/L (ref 22–32)
Calcium: 8.3 mg/dL — ABNORMAL LOW (ref 8.9–10.3)
Chloride: 101 mmol/L (ref 101–111)
Creatinine, Ser: 1.02 mg/dL — ABNORMAL HIGH (ref 0.44–1.00)
GFR, EST NON AFRICAN AMERICAN: 56 mL/min — AB (ref >60)
Glucose, Bld: 120 mg/dL — ABNORMAL HIGH (ref 65–99)
POTASSIUM: 4.4 mmol/L (ref 3.5–5.1)
SODIUM: 135 mmol/L (ref 135–145)

## 2016-08-26 LAB — CBC
HEMATOCRIT: 25.4 % — AB (ref 36.0–46.0)
Hemoglobin: 7.7 g/dL — ABNORMAL LOW (ref 12.0–15.0)
MCH: 20.6 pg — AB (ref 26.0–34.0)
MCHC: 30.3 g/dL (ref 30.0–36.0)
MCV: 67.9 fL — AB (ref 78.0–100.0)
Platelets: 280 10*3/uL (ref 150–400)
RBC: 3.74 MIL/uL — ABNORMAL LOW (ref 3.87–5.11)
RDW: 20.2 % — AB (ref 11.5–15.5)
WBC: 7.6 10*3/uL (ref 4.0–10.5)

## 2016-08-26 LAB — AEROBIC CULTURE W GRAM STAIN (SUPERFICIAL SPECIMEN)

## 2016-08-26 LAB — AEROBIC CULTURE  (SUPERFICIAL SPECIMEN)

## 2016-08-26 MED ORDER — AMPICILLIN SODIUM 1 G IJ SOLR
1.0000 g | Freq: Four times a day (QID) | INTRAMUSCULAR | Status: DC
  Administered 2016-08-26 – 2016-08-27 (×3): 1 g via INTRAVENOUS
  Filled 2016-08-26 (×6): qty 1000

## 2016-08-26 NOTE — Progress Notes (Signed)
  Subjective: [name] is improving this morning but still having pain.  It is adequately controlled at this time.  She has no other acute complaints.  Objective:  Vital signs in last 24 hours: Vitals:   08/25/16 1812 08/25/16 2120 08/25/16 2249 08/26/16 0545  BP: (!) 143/49  (!) 111/45 (!) 136/51  Pulse:   63 85  Resp:   18 17  Temp:   98.2 F (36.8 C) 98.4 F (36.9 C)  TempSrc:   Oral   SpO2:  97% 97% 96%  Weight:      Height:       Physical Exam  Constitutional: She is oriented to person, place, and time. She appears well-developed and well-nourished. No distress.  HENT:  Head: Normocephalic and atraumatic.  Eyes: No scleral icterus.  Cardiovascular: Normal rate and regular rhythm.   No murmur heard. Pulmonary/Chest: Effort normal.  Abdominal: Soft. She exhibits no distension. There is tenderness. There is no guarding.  Abdominal bandages with serosanguinous drainage.  No purulence or surrounding erythema.  Penrose drain in place.  Vertical scar in midline of abdomen.  Neurological: She is alert and oriented to person, place, and time.  Skin: Skin is warm and dry. She is not diaphoretic.     Assessment/Plan: Pt is a 66 y.o. yo female with a PMHx of afib on eliquis, morbid obesity, OSA, colon cancer s/p resection, gastric bypass s/p repair of incarcerated incisional hernia 04/2016 who was admitted on 08/23/2016 with symptoms of abdominal wall abscess.  Principal Problem:   Abdominal wall abscess Active Problems:   Essential hypertension   Paroxysmal atrial fibrillation (HCC)   Morbid obesity (HCC)   Iron deficiency anemia  Abdominal wall abscess She was taken for drainage and debridement of abdominal wall abscess in the operating room 08/25/2015.  Remains afebrile.  Leukocytosis improving.  Intraoperative cultures are growing Enterococcus faecalis with susceptibilities pending.  -Continue Zosyn for now, narrow antibiotics as able.  Essential hypertension BP this  morning is 136/51 - Continue home medications lisinopril, carvedilol, and diltiazem   Paroxysmal atrial fibrillation Currently in NSR. CHADSVASC = 3  - continue home medications eliquis, cardizem, and carvedilol - continue home medication flecanide   Iron deficiency anemia On admission she had hemoglobin 9.4 with baseline 8-11. MCV 68 RDW 20 and ferritin 43. This iron deficiency may be related to anemia of chronic disease and her history of multiple recent surgeries. - hemoglobin this morning drifted down to 7.7 from 8.4 - continue to follow, transfuse for less than 7.0 - continue iron supps  Dispo: Anticipated discharge in approximately 1-2 day(s).   Jule Ser, DO 08/26/2016, 10:57 AM Pager: [phone]

## 2016-08-26 NOTE — Progress Notes (Signed)
2 Days Post-Op  Subjective:  Alert and stable.  Pain better.  Afebrile.  Heart rate 85. WBC 7600.  Hemoglobin 7.7, drifted down No external signs of bleeding, however.  Apixiban anticoagulation resumed. Remains on Zosyn  Preliminary culture report is moderate Enterococcus faecalis.  Sensitivities pending  Objective: Vital signs in last 24 hours: Temp:  [98.2 F (36.8 C)-98.4 F (36.9 C)] 98.4 F (36.9 C) (01/07 0545) Pulse Rate:  [55-85] 85 (01/07 0545) Resp:  [17-18] 17 (01/07 0545) BP: (101-143)/(24-51) 136/51 (01/07 0545) SpO2:  [96 %-100 %] 96 % (01/07 0545) Last BM Date: 08/25/15  Intake/Output from previous day: 01/06 0701 - 01/07 0700 In: 756.5 [P.O.:366; I.V.:240.5; IV Piggyback:150] Out: 700 [Urine:700] Intake/Output this shift: No intake/output data recorded.  General appearance: Alert.  Sitting up in bed.  No distress.  Morbidly obese. GI: Soft.  Nontender except around wound site.  Wound site with packing and Penrose drain in place.  Serosanguineous drainage.  No odor or necrosis.  Lab Results:  Results for orders placed or performed during the hospital encounter of 08/23/16 (from the past 24 hour(s))  CBC     Status: Abnormal   Collection Time: 08/26/16  5:55 AM  Result Value Ref Range   WBC 7.6 4.0 - 10.5 K/uL   RBC 3.74 (L) 3.87 - 5.11 MIL/uL   Hemoglobin 7.7 (L) 12.0 - 15.0 g/dL   HCT 25.4 (L) 36.0 - 46.0 %   MCV 67.9 (L) 78.0 - 100.0 fL   MCH 20.6 (L) 26.0 - 34.0 pg   MCHC 30.3 30.0 - 36.0 g/dL   RDW 20.2 (H) 11.5 - 15.5 %   Platelets 280 150 - 400 K/uL  Basic metabolic panel     Status: Abnormal   Collection Time: 08/26/16  5:55 AM  Result Value Ref Range   Sodium 135 135 - 145 mmol/L   Potassium 4.4 3.5 - 5.1 mmol/L   Chloride 101 101 - 111 mmol/L   CO2 24 22 - 32 mmol/L   Glucose, Bld 120 (H) 65 - 99 mg/dL   BUN 15 6 - 20 mg/dL   Creatinine, Ser 1.02 (H) 0.44 - 1.00 mg/dL   Calcium 8.3 (L) 8.9 - 10.3 mg/dL   GFR calc non Af Amer 56 (L)  >60 mL/min   GFR calc Af Amer >60 >60 mL/min   Anion gap 10 5 - 15     Studies/Results: No results found.  Marland Kitchen apixaban  5 mg Oral BID  . atorvastatin  10 mg Oral Daily  . carvedilol  12.5 mg Oral BID WC  . diltiazem  120 mg Oral Daily  . docusate sodium  100 mg Oral BID  . ferrous sulfate  325 mg Oral Q breakfast  . flecainide  100 mg Oral BID  . lisinopril  5 mg Oral Daily  . mometasone-formoterol  2 puff Inhalation BID  . pantoprazole  40 mg Oral Daily  . piperacillin-tazobactam (ZOSYN)  IV  3.375 g Intravenous Q8H     Assessment/Plan: s/p Procedure(s): IRRIGATION AND DEBRIDEMENT OF ABDOMINAL WALL ABCESS   Abdominal wall abscess. POD #2 exploration and drainage and debridement abdominal wall abscess.  Seems to be adequately drained. Afebrile and leukocytosis has resolved. Begin daily wound care Leave Penrose in place Will discontinue daily packing with gauze as this seems to be now a unilocular cavity.  Discussed with RN.  Orders written Continue Zosyn pending culture report.  Adjust if enterococcus faecalis sensitivities require  History ventral hernia  repair with mesh.  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  Paroxysmal atrial fibrillation-on eloquence, Cardizem, carvedilol, flecainide Obesity Hypertension  '@PROBHOSP'$ @  LOS: 3 days    Suzanne Macdonald M 08/26/2016  . .prob

## 2016-08-26 NOTE — Progress Notes (Signed)
Medicine attending: I examined this patient this morning together with resident physician Dr. Jule Ser and I concur with his evaluation and management plan which we discussed together. She is uncomfortable and still having local pain status post incision and drainage and lavage of a large loculated recurrent abdominal wall abscess in the right lower quadrant. There is a Penrose drain in place. Overlying gauze pad with serosanguineous but not purulent drainage. She remains afebrile on Zosyn. Abscess cultures grew enterococcus. Awaiting sensitivities to Cipro have an alternative oral antibiotic that she can be switched to. White count has now normalized.

## 2016-08-27 LAB — CBC
HCT: 27.1 % — ABNORMAL LOW (ref 36.0–46.0)
HEMOGLOBIN: 8.3 g/dL — AB (ref 12.0–15.0)
MCH: 20.9 pg — AB (ref 26.0–34.0)
MCHC: 30.6 g/dL (ref 30.0–36.0)
MCV: 68.3 fL — AB (ref 78.0–100.0)
PLATELETS: 330 10*3/uL (ref 150–400)
RBC: 3.97 MIL/uL (ref 3.87–5.11)
RDW: 20.2 % — ABNORMAL HIGH (ref 11.5–15.5)
WBC: 7.4 10*3/uL (ref 4.0–10.5)

## 2016-08-27 MED ORDER — SENNA 8.6 MG PO TABS
2.0000 | ORAL_TABLET | Freq: Every day | ORAL | Status: DC
  Administered 2016-08-27: 17.2 mg via ORAL
  Filled 2016-08-27: qty 2

## 2016-08-27 MED ORDER — AMOXICILLIN-POT CLAVULANATE 875-125 MG PO TABS: 1.0000 | ORAL_TABLET | Freq: Two times a day (BID) | ORAL | 0 refills | Status: DC

## 2016-08-27 MED ORDER — FERROUS SULFATE 325 (65 FE) MG PO TABS: 325.0000 mg | ORAL_TABLET | Freq: Every day | ORAL | 3 refills | Status: DC

## 2016-08-27 MED ORDER — SACCHAROMYCES BOULARDII 250 MG PO CAPS
250.0000 mg | ORAL_CAPSULE | Freq: Two times a day (BID) | ORAL | Status: DC
  Administered 2016-08-27: 250 mg via ORAL
  Filled 2016-08-27: qty 1

## 2016-08-27 MED ORDER — HEPARIN SOD (PORK) LOCK FLUSH 100 UNIT/ML IV SOLN: 500.0000 [IU] | INTRAVENOUS | Status: DC | PRN

## 2016-08-27 MED ORDER — SODIUM CHLORIDE 0.9 % IV SOLN
3.0000 g | Freq: Four times a day (QID) | INTRAVENOUS | Status: DC
  Filled 2016-08-27 (×2): qty 3

## 2016-08-27 MED ORDER — OXYCODONE HCL 5 MG PO TABS: 5.0000 mg | ORAL_TABLET | Freq: Four times a day (QID) | ORAL | 0 refills | Status: DC | PRN

## 2016-08-27 NOTE — Care Management Important Message (Signed)
Important Message  Patient Details  Name: Suzanne Macdonald MRN: [id number] Date of Birth: 01-14-1951   Medicare Important Message Given:  Yes    Charleen Madera Montine Circle 08/27/2016, 3:53 PM

## 2016-08-27 NOTE — Discharge Summary (Signed)
Medicine attending discharge note: I personally examined this patient on the day of discharge and I attest to the accuracy of the discharge evaluation and plan as recorded in the final progress note by resident physician Dr. Ledell Noss.  Clinical summary: Morbidly obese 66 year old woman with a resected, probably stage III, colon cancer diagnosed in 2015 while living in [place]. Adjuvant chemotherapy abandoned due to excessive toxicity. Additional surgeries had also include gastric bypass and lap band to control weight. She has had a number of complications since those surgeries with initial infection requiring removal of the lap band and a partial colectomy. She has had multiple ventral hernia repairs with mesh. She developed an incarcerated ventral hernia repaired in [place] in September 2017. She developed an abdominal wall abscess in October 2017 requiring percutaneous drainage and antibiotics. She was readmitted in December 2017 with an abdominal wall cellulitis and a small recurrent abscess. She presented at the current time with increasing pain, swelling, and erythema on the skin of the right lower abdominal quadrant, low-grade fevers, and a CT scan showed a large 6.5 x 4.8 cm abdominal wall abscess. Abscess was coming to a head and burst spontaneously while the patient was being transported from the emergency department to her hospital bed.  Hospital course: She was afebrile on admission and remained so for the entire hospital course. Initial white blood count 14,000 with 77% neutrophils. She was started on Zosyn. Surgical consultation was obtained. She was taken to the operating room for incision, drainage, and lavage of the abscess on 08/24/2016. Findings were multiseptated abscess. Penrose drain left in place. Approximate 3 cm wound left open to drain by second intention. She continued to have local pain and discomfort but no further purulent drainage. Cultures of the abscess fluid grew  enterococcus fecalis sensitive to ampicillin and vancomycin but resistant to gentamicin. Surgeons felt comfortable by hospital day 4 transitioning her to oral antibiotics. We chose Augmentin. Although she had previous GI intolerance to this with diarrhea, she is currently constipated. We do not consider her GI tolerance to be an allergy. In contrast, she had a moderate hypersensitivity reaction to vancomycin in the past with sudden onset of dyspnea, chest tightness and flushing, which definitely contraindicates the use of this drug. White count normalized and was 7400 at discharge.  She has paroxysmal atrial fibrillation. She was on chronic anticoagulation with Eliquis. Anticoagulants were held temporarily for the surgery and then resumed. Most recent cardiogram done 08/11/2016 showed she was back in sinus rhythm at that time. No clinical arrhythmias during this admission.  Disposition: Condition stable at time of discharge She will follow-up with general surgery and her regular primary care physicians There were no complications

## 2016-08-27 NOTE — Progress Notes (Signed)
3 Days Post-Op  Subjective: She is tolerating diet, open wound is still draining and has penrose drain in place.  Still draining some serosanguinous and and purulent fluid on the dressing.    Objective: Vital signs in last 24 hours: Temp:  [97.8 F (36.6 C)-98.3 F (36.8 C)] 97.8 F (36.6 C) (01/08 5462) Pulse Rate:  [60-63] 60 (01/08 0613) Resp:  [18-19] 19 (01/08 0613) BP: (110-124)/(50-61) 124/50 (01/08 0613) SpO2:  [96 %-98 %] 97 % (01/08 0613) Last BM Date: 08/24/16 820 PO 150 IV 600 urine recorded No bm recorded Afebrile, VSS Creatinine is up some but stable on 08/26/16 WBC stable, H/H is stable Intake/Output from previous day: 01/07 0701 - 01/08 0700 In: 970 [P.O.:820; IV Piggyback:150] Out: 600 [Urine:600] Intake/Output this shift: No intake/output data recorded.  General appearance: alert, cooperative and no distress GI: soft, non-tender; bowel sounds normal; no masses,  no organomegaly and open site is ok, drainage as above.    Lab Results:   Recent Labs  08/26/16 0555 08/27/16 0432  WBC 7.6 7.4  HGB 7.7* 8.3*  HCT 25.4* 27.1*  PLT 280 330    BMET  Recent Labs  08/26/16 0555  NA 135  K 4.4  CL 101  CO2 24  GLUCOSE 120*  BUN 15  CREATININE 1.02*  CALCIUM 8.3*   PT/INR No results for input(s): LABPROT, INR in the last 72 hours.   Recent Labs Lab 08/23/16 1249  AST 23  ALT 19  ALKPHOS 77  BILITOT 0.6  PROT 6.5  ALBUMIN 3.1*     Lipase     Component Value Date/Time   LIPASE 21 08/09/2016 2313     Studies/Results: No results found. Prior to Admission medications   Medication Sig Start Date End Date Taking? Authorizing Provider  acetaminophen (TYLENOL) 500 MG tablet Take 1,000 mg by mouth every 6 (six) hours as needed for mild pain or moderate pain.   Yes Historical Provider, MD  acetaminophen-codeine (TYLENOL #3) 300-30 MG tablet Take 1-2 tablets by mouth every 4 (four) hours as needed for moderate pain. 08/13/16  Yes Lorella Nimrod, MD  albuterol (PROVENTIL HFA;VENTOLIN HFA) 108 (90 Base) MCG/ACT inhaler Inhale 1-2 puffs into the lungs every 6 (six) hours as needed for wheezing or shortness of breath.   Yes Historical Provider, MD  apixaban (ELIQUIS) 5 MG TABS tablet Take 1 tablet (5 mg total) by mouth 2 (two) times daily. 03/13/16  Yes Shela Leff, MD  atorvastatin (LIPITOR) 10 MG tablet take 1 tablet by mouth once daily 04/04/16  Yes Shela Leff, MD  budesonide-formoterol [facility]) 160-4.5 MCG/ACT inhaler Inhale 2 puffs into the lungs 2 (two) times daily. 01/24/16  Yes Milagros Loll, MD  Calcium-Phosphorus-Vitamin D (CALCIUM/VITAMIN D3/ADULT GUMMY PO) Take 2 each by mouth daily.   Yes Historical Provider, MD  carvedilol (COREG) 12.5 MG tablet Take 1 tablet (12.5 mg total) by mouth 2 (two) times daily with a meal. 07/23/16  Yes Rhonda G Barrett, PA-C  Cholecalciferol (VITAMIN D3) 2000 units CHEW Chew 2,000 Units by mouth 2 (two) times daily.   Yes Historical Provider, MD  cyanocobalamin 500 MCG tablet Take 1,000 mcg by mouth daily.    Yes Historical Provider, MD  diltiazem (CARDIZEM CD) 120 MG 24 hr capsule Take 1 capsule (120 mg total) by mouth daily. 07/13/16  Yes Alphonzo Grieve, MD  diltiazem (CARDIZEM) 30 MG tablet Take 1 tablet every 4 hours AS NEEDED for AFIB fast heart rate >100 as long as  blood pressure >100. 07/30/16  Yes Sherran Needs, NP  docusate sodium (COLACE) 100 MG capsule Take 1 capsule (100 mg total) by mouth 2 (two) times daily as needed for mild constipation. 06/14/16  Yes Ledell Noss, MD  flecainide (TAMBOCOR) 100 MG tablet take 1 tablet by mouth twice a day 08/06/16  Yes [name], MD  lisinopril (PRINIVIL,ZESTRIL) 5 MG tablet take 1 tablet by mouth once daily 07/27/16  Yes [name], MD  loperamide (IMODIUM) 2 MG capsule Take 2 mg by mouth daily as needed for diarrhea or loose stools.   Yes Historical Provider, MD  Multiple Vitamins-Minerals (ALIVE WOMENS GUMMY) CHEW Chew 2  each by mouth daily.   Yes Historical Provider, MD  omeprazole (PRILOSEC) 40 MG capsule Take 1 capsule (40 mg total) by mouth daily. 04/27/16  Yes [name], MD  potassium chloride (K-DUR) 10 MEQ tablet 2 TABLETS BY MOUTH DAILY 06/27/16  Yes [name], MD  prochlorperazine (COMPAZINE) 10 MG tablet Take 10 mg by mouth every 6 (six) hours as needed for nausea or vomiting.   Yes Historical Provider, MD  traZODone (DESYREL) 100 MG tablet Take 100 mg by mouth at bedtime. 04/22/16  Yes Historical Provider, MD  HYDROcodone-acetaminophen (NORCO/VICODIN) 5-325 MG tablet Take 1-2 tablets by mouth every 4 (four) hours as needed. Patient not taking: Reported on 08/23/2016 08/10/16   Delos Haring, PA-C    Medications: . ampicillin (OMNIPEN) IV  1 g Intravenous Q6H  . apixaban  5 mg Oral BID  . atorvastatin  10 mg Oral Daily  . carvedilol  12.5 mg Oral BID WC  . diltiazem  120 mg Oral Daily  . ferrous sulfate  325 mg Oral Q breakfast  . flecainide  100 mg Oral BID  . lisinopril  5 mg Oral Daily  . mometasone-formoterol  2 puff Inhalation BID  . pantoprazole  40 mg Oral Daily  . senna  2 tablet Oral Daily   . lactated ringers 10 mL/hr at 08/24/16 1225   Specimen Description ABSCESS ABDOMEN   Special Requests POF CLEOCIN AND ZOSYN   Gram Stain RARE WBC PRESENT, PREDOMINANTLY PMN RARE GRAM POSITIVE COCCI IN PAIRS   Culture MODERATE ENTEROCOCCUS FAECALIS   Report Status 08/26/2016 FINAL   Organism ID, Bacteria ENTEROCOCCUS FAECALIS   Resulting Agency [facility]  Susceptibility    Enterococcus faecalis    MIC    AMPICILLIN <=2 SENSITIVE "><=2 SENSITIVE  Sensitive    GENTAMICIN SYNERGY RESISTANT  Resistant    VANCOMYCIN 1 SENSITIVE  Sensitive       Assessment/Plan Abdominal wall abscess; s/p [facility] 05/06/16, Dr. Excell Seltzer; IR drain 06/28/16 HX of gastric bypass,  colon and cervical cancer S/p I&D complex right abdominal wall abscess 08/24/16, Dr.Byerly Atrial fibrillation on chronic  anticoagulation - Eliquis Hypertension Asthma OSA Body mass index is 52. FEN:  Regular diet/IV at [facility] ID:  Zosyn 1/4 - 08/26/16; 08/26/16 =>> Ampicillin IV - day 2 DVT:  Back on Eliquis  Plan:  I think we can switch her over to Oral antibiotics and think about her going home. She has follow up with Dr. Excell Seltzer on 09/07/16 @ 1:30 PM.  Continue dry dressing over the open wound with penrose to remain in place.  Clean and replace dressing as needed.  I will check and see if it's OK for her to shower with the wound open.  Add probiotics.  Continue antibiotics for 10 days.         LOS: 4 days  Suzanne Macdonald 08/27/2016 [phone]

## 2016-08-27 NOTE — Progress Notes (Deleted)
Pharmacy Antibiotic Note  Suzanne Macdonald is a 66 y.o. female admitted on 08/23/2016 with abdominal wall abscess, now results as E.faecalis showing sensitivity to ampicillin.  Pharmacy has been consulted for Unasyn dosing.  Plan: Unasyn 3g IV Q6H.  Height: '5\' 2"'$  (157.5 cm) Weight: 284 lb 6.3 oz (129 kg) IBW/kg (Calculated) : 50.1  Temp (24hrs), Avg:98.1 F (36.7 C), Min:97.8 F (36.6 C), Max:98.3 F (36.8 C)   Recent Labs Lab 08/23/16 1249 08/24/16 0621 08/26/16 0555 08/27/16 0432  WBC 14.3* 10.2 7.6 7.4  CREATININE 0.93 1.04* 1.02*  --     Estimated Creatinine Clearance: 70.9 mL/min (by C-G formula based on SCr of 1.02 mg/dL (H)).    Allergies  Allergen Reactions  . Augmentin [Amoxicillin-Pot Clavulanate] Diarrhea  . Scallops [Shellfish Allergy] Hives  . Vancomycin Other (See Comments)    Pt began coughing and c/o tightening of chest/difficulty breathing and flushing during Vanc infusion.  Reports she never wants to have Vancomycin again even if it can be prevented with rate reduction  . Barium-Containing Compounds     Feels like cement in the body    Thank you for allowing pharmacy to be a part of this patient's care.  [name] Neat, PharmD, BCPS  08/27/2016 6:53 AM

## 2016-08-27 NOTE — Progress Notes (Signed)
  Subjective: Suzanne Macdonald feels that her abdominal pain has been well controlled today. She is sitting up in bed and completed a full plate of breakfast. She has been ambulating around her room comfortably. She has been afebrile overnight and is free of new concern or complaints.   Objective:  Vital signs in last 24 hours: Vitals:   08/26/16 1215 08/26/16 1409 08/26/16 2022 08/27/16 0613  BP:  117/61 (!) 110/50 (!) 124/50  Pulse:  63 60 60  Resp:  '18 19 19  '$ Temp:  98.1 F (36.7 C) 98.3 F (36.8 C) 97.8 F (36.6 C)  TempSrc:  Oral Oral Oral  SpO2: 96% 98% 98% 97%  Weight:      Height:       Physical Exam  Constitutional: She is oriented to person, place, and time. She appears well-developed and well-nourished. No distress.  HENT:  Head: Normocephalic and atraumatic.  Eyes: No scleral icterus.  Cardiovascular: Normal rate and regular rhythm.   No murmur heard. Pulmonary/Chest: Effort normal and breath sounds normal. No respiratory distress.  Abdominal: Soft. She exhibits no distension. There is tenderness. There is no guarding.  There is a open hole over the abscess with drain tube visible. Dressing is clean, dry, and intact.  Tenderness to palpation only over right lower quadrant abscess.  Neurological: She is alert and oriented to person, place, and time.  Skin: Skin is warm and dry. She is not diaphoretic.     Assessment/Plan: Pt is a 66 y.o. yo female with a PMHx of afib on eliquis, morbid obesity, OSA, colon cancer s/p resection, gastric bypass s/p repair of incarcerated incisional hernia 04/2016 who was admitted on 08/23/2016 with symptoms of abdominal wall abscess.  Principal Problem:   Abdominal wall abscess Active Problems:   Essential hypertension   Paroxysmal atrial fibrillation (HCC)   Morbid obesity (HCC)   Iron deficiency anemia  Abdominal wall abscess Post op day 3 from I&D of abdominal wall abscess. She remains afebrile and leukocytosis has resolved.  Surgical site cultures grew enterococcus faecalis susceptible to ampicillin. Was evaluated by surgery this morning, they have scheduled her for outpatient follow up 1/19 with Dr. Excell Seltzer and feel that she is ready to be switched to oral antibiotics and can return home today.  -switch IV ampicillin to augmentin -will arrange for wound care assistance at home   Essential hypertension Currently normotensive  - continue home medications lisinopril, carvedilol, and diltiazem   Paroxysmal atrial fibrillation Currently in NSR. CHADSVASC = 3  - Continue home medications eliquis, cardizem, carvedilol, and flecanide   Iron deficiency anemia On admission she had hemoglobin 9.4 with baseline 8-11, post surgery her hemoglobin dropped to 7.7 but it has improved to 8.3 today. This iron deficiency may be related to anemia of chronic disease and her history of multiple recent surgeries. - Will continue ferrous sulfate tablet supplementation at discharge   Constipation  Recommended OTC miralax if she has still not had a bowel movement when she returns home.   GERD  - continue home medication protonix 40 mg qd  Asthma -continue home medication dulera inhaler BID and albuterol nebs PRN   Dispo: Anticipated discharge today  Ledell Noss, MD 08/27/2016, 10:17 AM Pager: [phone]

## 2016-08-27 NOTE — Care Management Note (Signed)
Case Management Note  Patient Details  Name: [name] MRN: [id number] Date of Birth: [birth year]  Subjective/Objective:                    Action/Plan:  Patient requesting home health nurse Linus Orn. Santiago Glad with [facility] aware. Expected Discharge Date:                  Expected Discharge Plan:  [place]  In-House Referral:     Discharge planning Services  CM Consult  Post Acute Care Choice:  Home Health Choice offered to:  Patient  DME Arranged:    DME Agency:     HH Arranged:  RN [place] Agency:  [place]  Status of Service:  Completed, signed off  If discussed at [place] of Stay Meetings, dates discussed:    Additional Comments:  Marilu Favre, RN 08/27/2016, 10:24 AM

## 2016-08-27 NOTE — Discharge Summary (Signed)
Name: Sesilia Poucher MRN: [id number] DOB: [birth year] 66 y.o. PCP: Bartholomew Crews, MD  Date of Admission: 08/23/2016 10:30 AM Date of Discharge: 08/27/2016 Attending Physician: Annia Belt, MD  Discharge Diagnosis: 1.    Abdominal wall abscess   Discharge Medications: Allergies as of 08/27/2016      Reactions   Augmentin [amoxicillin-pot Clavulanate] Diarrhea   Scallops [shellfish Allergy] Hives   Vancomycin Other (See Comments)   Pt began coughing and c/o tightening of chest/difficulty breathing and flushing during Vanc infusion.  Reports she never wants to have Vancomycin again even if it can be prevented with rate reduction   Barium-containing Compounds    Feels like cement in the body      Medication List    STOP taking these medications   sulfamethoxazole-trimethoprim 800-160 MG tablet Commonly known as:  BACTRIM DS,SEPTRA DS     TAKE these medications   acetaminophen 500 MG tablet Commonly known as:  TYLENOL Take 1,000 mg by mouth every 6 (six) hours as needed for mild pain or moderate pain.   acetaminophen-codeine 300-30 MG tablet Commonly known as:  TYLENOL #3 Take 1-2 tablets by mouth every 4 (four) hours as needed for moderate pain.   albuterol 108 (90 Base) MCG/ACT inhaler Commonly known as:  PROVENTIL HFA;VENTOLIN HFA Inhale 1-2 puffs into the lungs every 6 (six) hours as needed for wheezing or shortness of breath.   ALIVE WOMENS GUMMY Chew Chew 2 each by mouth daily.   amoxicillin-clavulanate 875-125 MG tablet Commonly known as:  AUGMENTIN Take 1 tablet by mouth 2 (two) times daily. Stop date 09/02/2016   apixaban 5 MG Tabs tablet Commonly known as:  ELIQUIS Take 1 tablet (5 mg total) by mouth 2 (two) times daily.   atorvastatin 10 MG tablet Commonly known as:  LIPITOR take 1 tablet by mouth once daily   budesonide-formoterol 160-4.5 MCG/ACT inhaler Commonly known as:  SYMBICORT Inhale 2 puffs into the lungs 2 (two) times daily.     CALCIUM/VITAMIN D3/ADULT GUMMY PO Take 2 each by mouth daily.   carvedilol 12.5 MG tablet Commonly known as:  COREG Take 1 tablet (12.5 mg total) by mouth 2 (two) times daily with a meal.   cyanocobalamin 500 MCG tablet Take 1,000 mcg by mouth daily.   diltiazem 120 MG 24 hr capsule Commonly known as:  CARDIZEM CD Take 1 capsule (120 mg total) by mouth daily.   diltiazem 30 MG tablet Commonly known as:  CARDIZEM Take 1 tablet every 4 hours AS NEEDED for AFIB fast heart rate >100 as long as blood pressure >100.   docusate sodium 100 MG capsule Commonly known as:  COLACE Take 1 capsule (100 mg total) by mouth 2 (two) times daily as needed for mild constipation.   ferrous sulfate 325 (65 FE) MG tablet Take 1 tablet (325 mg total) by mouth daily with breakfast.   flecainide 100 MG tablet Commonly known as:  TAMBOCOR take 1 tablet by mouth twice a day   HYDROcodone-acetaminophen 5-325 MG tablet Commonly known as:  NORCO/VICODIN Take 1-2 tablets by mouth every 4 (four) hours as needed.   lisinopril 5 MG tablet Commonly known as:  PRINIVIL,ZESTRIL take 1 tablet by mouth once daily   loperamide 2 MG capsule Commonly known as:  IMODIUM Take 2 mg by mouth daily as needed for diarrhea or loose stools.   omeprazole 40 MG capsule Commonly known as:  PRILOSEC Take 1 capsule (40 mg total) by mouth daily.  oxyCODONE 5 MG immediate release tablet Commonly known as:  Oxy IR/ROXICODONE Take 1-2 tablets (5-10 mg total) by mouth every 6 (six) hours as needed ('5mg'$  for moderate pain, '10mg'$  for severe pain).   potassium chloride 10 MEQ tablet Commonly known as:  K-DUR 2 TABLETS BY MOUTH DAILY   prochlorperazine 10 MG tablet Commonly known as:  COMPAZINE Take 10 mg by mouth every 6 (six) hours as needed for nausea or vomiting.   traZODone 100 MG tablet Commonly known as:  DESYREL Take 100 mg by mouth at bedtime.   Vitamin D3 2000 units Chew Chew 2,000 Units by mouth 2 (two)  times daily.       Disposition and follow-up:   Ms.Alvena Marley was discharged from [facility] in Stable condition.  At the hospital follow up visit please address:  1.  Abdominal wall abscess- Is drain removal complete or scheduled? Has she completed the course of antibiotics?    2.  Labs / imaging needed at time of follow-up: none   3.  Pending labs/ test needing follow-up: none   Follow-up Appointments: Follow-up Information    Edward Jolly, MD Follow up on 09/07/2016.   Specialty:  General Surgery Why:  Appointment is at 1:30 PM, be at the office 30 minutes early.   Contact information: [address]  [place] [address] [phone]        [facility]. Go on 09/06/2016.   Why:  You have an appointment scheduled for 8:45  Contact information: [address] [place] [facility] Course by problem list:    Abdominal wall abscess 66 year old woman with PMH colon cancer s/p partial colectomy, s/p gastric bypass, with multiple ventral hernia repairs. Most recent surgery 04/2016 for incarcerated ventral hernia with repair complicated by an abdominal wall abscess which developed 05/2016. The abscess grew ecoli and budding yeast and required percutaneous drainage and IV ceftriaxone and fluconazole at discharge. She was readmitted 07/2016 with abdominal wall cellulitis and recurrence of the abscess and was treated with zosyn and clindamycin and discharged on a two week course of oral bactrim. This admission she presented with painful abdominal wall swelling. She was afebrile but had leukocytosis and anemia. CT abdomen showed a 6.5 x 5 cm abdominal wall abscess. She was started on zosyn and surgery performed drainage and debridement and placed a penrose. Cultures grew enterococcus faecalis susceptible to ampicillin and she was discharged with augmentin to complete a 10 day total  course of antibiotics. On the day of discharge she was asking to be discharged, she was afebrile with resolution of her leukocytosis and was back to walking around her room and eating a regular diet.     Paroxysmal atrial fibrillation (HCC) She remained in NSR this admission. Home medication eliquis was held until the time of surgery then restarted. Home meds cardizem, carvedilol, and flecanide were continued throughout this hospitalization.     Iron deficiency anemia On admission she had hemoglobin 9.4 with baseline 8-11, post surgery her hemoglobin dropped to 7.7 but it has improved to 8.3 on the day of discharge. This iron deficiency may be related to anemia of chronic disease and her history of multiple recent surgeries. She was started on ferrous sulfate supplementation.    Discharge Vitals:   BP (!) 123/56 (BP Location: Right Arm)   Pulse 61   Temp 98.5 F (36.9 C) (  Oral)   Resp 18   Ht '5\' 2"'$  (1.575 m)   Wt 284 lb 6.3 oz (129 kg)   SpO2 96%   BMI 52.02 kg/m   Pertinent Labs, Studies, and Procedures:  Procedures Performed:  Dg Chest 2 View  Result Date: 08/11/2016 CLINICAL DATA:  Productive cough. History abdominal wall abscess. Drainage catheter removed 08/07/2016. EXAM: CHEST  2 VIEW COMPARISON:  Single-view of the chest 07/11/2016. FINDINGS: Lungs are clear. Heart size is normal. No pneumothorax or fluid fusion. Port-A-Cath noted. No focal bony abnormality. IMPRESSION: No acute disease. Electronically Signed   By: Inge Rise M.D.   On: 08/11/2016 20:42   Ct Abdomen Pelvis W Contrast  Result Date: 08/23/2016 CLINICAL DATA:  Abdominal wall abscess EXAM: CT ABDOMEN AND PELVIS WITH CONTRAST TECHNIQUE: Multidetector CT imaging of the abdomen and pelvis was performed using the standard protocol following bolus administration of intravenous contrast. CONTRAST:  184m ISOVUE-300 IOPAMIDOL (ISOVUE-300) INJECTION 61% COMPARISON:  08/10/2016 FINDINGS: Lower chest: Lung bases are  clear. No effusions. Heart is normal size. Hepatobiliary: No focal hepatic abnormality. Gallbladder unremarkable. Pancreas: No focal abnormality or ductal dilatation. Spleen: No focal abnormality.  Normal size. Adrenals/Urinary Tract: No adrenal abnormality. No focal renal abnormality. No stones or hydronephrosis. Urinary bladder is unremarkable. Stomach/Bowel: Postoperative changes in the sigmoid colon and stomach. Moderate to large stool burden in the colon. No evidence of bowel obstruction. Vascular/Lymphatic: Scattered aortic calcifications. No aneurysm or adenopathy. Reproductive: Prior hysterectomy.  No adnexal masses. Other: No free fluid or free air. Enlarging fluid collection in the right lateral abdominal wall, measuring 6.5 x 4.8 cm currently, compared with 2.4 x 2.1 cm previously. Locules of gas are noted within this. Findings compatible with enlarging subcutaneous abscess. Surrounding inflammatory stranding. Previously seen midline ventral wall fluid collection no longer visible. Musculoskeletal: No acute bony abnormality or focal bone lesion. IMPRESSION: Enlarging subcutaneous right lateral abdominal wall abscess, now 6.5 x 4.8 cm. Moderate to large stool burden in the colon. Postoperative changes in the sigmoid colon and stomach. Electronically Signed   By: KRolm BaptiseM.D.   On: 08/23/2016 14:49   Ct Abdomen Pelvis W Contrast  Result Date: 08/10/2016 CLINICAL DATA:  Abdominal pain today.  Febrile. EXAM: CT ABDOMEN AND PELVIS WITH CONTRAST TECHNIQUE: Multidetector CT imaging of the abdomen and pelvis was performed using the standard protocol following bolus administration of intravenous contrast. CONTRAST:  1095mISOVUE-300 IOPAMIDOL (ISOVUE-300) INJECTION 61% COMPARISON:  08/07/2016, 06/28/2016, 06/11/2016. FINDINGS: Lower chest: No acute abnormality. Hepatobiliary: No focal liver abnormality is seen. No gallstones, gallbladder wall thickening, or biliary dilatation. Pancreas: Unremarkable. No  pancreatic ductal dilatation or surrounding inflammatory changes. Spleen: Normal in size without focal abnormality. Adrenals/Urinary Tract: Adrenal glands are unremarkable. Kidneys are normal, without renal calculi, focal lesion, or hydronephrosis. Bladder is unremarkable. Stomach/Bowel: Stomach is within normal limits. Small bowel anastomosis in the right lower quadrant is unremarkable except that it may be adherent to the undersurface of the anterior abdominal wall. This is unchanged. Appendix is normal. No evidence of colonic wall thickening or inflammation. Vascular/Lymphatic: The abdominal aorta is normal in caliber with mild atherosclerotic calcification. No adenopathy. Reproductive: Status post hysterectomy. No adnexal masses. Other: There is a 2.4 cm recurrent collection in the right lateral subcutaneous tissues, much smaller than the original collection. Mild surrounding stranding opacity extending up the skin surface. There is unchanged fluid collection along the anterior abdominal wall related to prior ventral hernia repair. The thickness of the collection measures up to 1.7  cm and the collection covers a large portion of the anterior abdomen. No gas within this collection. Musculoskeletal: No significant skeletal lesion. 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 IMPRESSION: 1. Recurrent air and fluid collection in the right lateral subcutaneous tissues, much smaller than the original collection, only 2.4 cm at this time. There also is adjacent cellulitic-appearing stranding opacity extending out to the skin. 2. Persistent anterior abdominal wall fluid collection associated with prior ventral hernia repair although it is slowly decreasing in thickness. No air within this collection. 3. Small bowel anastomosis in the right lower quadrant may be adherent to the undersurface of the anterior abdominal wall. No fistula is evident. No bowel obstruction. Electronically Signed   By: Andreas Newport M.D.    On: 08/10/2016 02:52   Ct Abdomen Pelvis W Contrast  Result Date: 08/07/2016 CLINICAL DATA:  66 year old female with a history of abdominal wall abscess treated by percutaneous catheter drainage on 06/11/2016. Evaluate for residual abdominal wall abscess and fistulous connection to the underlying small bowel anastomosis. EXAM: CT ABDOMEN AND PELVIS WITH CONTRAST TECHNIQUE: Multidetector CT imaging of the abdomen and pelvis was performed using the standard protocol following bolus administration of intravenous contrast. CONTRAST:  116m ISOVUE-300 IOPAMIDOL (ISOVUE-300) INJECTION 61% COMPARISON:  Most recent prior CT imaging 06/28/2016 FINDINGS: Lower chest: The lung bases are clear. Visualized cardiac structures are within normal limits for size. No pericardial effusion. Unremarkable visualized distal thoracic esophagus. Hepatobiliary: Diffuse low attenuation of the hepatic parenchyma again noted. Findings remain suggestive of hepatic steatosis. No discrete liver lesion. Gallbladder is unremarkable. No intra or extrahepatic biliary ductal dilatation. Pancreas: Unremarkable. No pancreatic ductal dilatation or surrounding inflammatory changes. Spleen: Normal in size without focal abnormality. Adrenals/Urinary Tract: Adrenal glands are unremarkable. Kidneys are normal, without renal calculi, focal lesion, or hydronephrosis. Bladder is unremarkable. Stomach/Bowel: Surgical changes of prior gastric bypass procedure. No definite scratch then no evidence of obstruction or focal bowel wall thickening. Indeterminate 1.5 x 1.5 cm fluid collection in the peritoneal space immediately adjacent to and abutting the small bowel anastomosis has decreased in size compared to 2.2 x 2.0 cm previously. This may represent a tiny residual abscess versus a focal diverticulum. No other intra-abdominal fluid collection identified. Vascular/Lymphatic: Aortic atherosclerosis. No enlarged abdominal or pelvic lymph nodes. Reproductive:  Surgical changes of prior hysterectomy. No adnexal masses. Other: Continued interval decrease in the amount of fluid within the anterior abdominal wall at the site of the prior ventral hernia repair. Maximal fluid depth today is 1.1 cm compared to 2.2 cm previously. Additionally, the previously noted right lower quadrant fluid collection has completely resolved. The drainage catheter remains in unchanged position. No definite tract between the drainage catheter and the subjacent intraperitoneal small bowel anastomosis. Musculoskeletal: No acute fracture or aggressive appearing lytic or blastic osseous lesion. Lower lumbar facet arthropathy. IMPRESSION: 1. Complete resolution of the abscess collection along the right lateral abdominal wall. The drainage catheter remains in good position. No convincing evidence of sinus tract between the drainage catheter and the subjacent small bowel anastomosis. 2. Continued evolution and decrease in the volume of fluid within the ventral hernia repair resection bed compared to 06/28/16. 3. Smaller indeterminate fluid collection in the right lateral abdomen adjacent to the small bowel anastomosis. This either represents a tiny resolving confined abscess or a small diverticulum that fluctuates in size. 4. No evidence of new fluid collection or abscess. 5. No new clinically significant findings. Electronically Signed   By: HDellis FilbertD.  On: 08/07/2016 17:34   Dg Sinus/fist Tube Chk-non Gi  Result Date: 08/07/2016 INDICATION: 66 year old female with a history of abdominal wall abscess. A percutaneous drainage catheter was placed on 06/11/2016. Output is non-existent. No evidence of residual abscess on recent CT scan. Evaluate for fistulous connection to the underlying bowel. EXAM: ABSCESS INJECTION MEDICATIONS: None ANESTHESIA/SEDATION: None COMPLICATIONS: None immediate. PROCEDURE: A gentle hand injection of contrast material was performed under fluoroscopy. The  injected contrast material fills a very small collapsed abscess cavity in then refluxes along the catheter tract to the skin surface. No evidence of fistulous communication with the peritoneal space or bowel. IMPRESSION: No residual abscess cavity or evidence of fistula. The drainage catheter was removed. Electronically Signed   By: Jacqulynn Cadet M.D.   On: 08/07/2016 17:24   Ir Radiologist Eval & Mgmt  Result Date: 08/24/2016 Please refer to "Notes" to see consult details.  Consultations: General surgery   Discharge Instructions: Discharge Instructions    Call MD for:  redness, tenderness, or signs of infection (pain, swelling, redness, odor or green/yellow discharge around incision site)    Complete by:  As directed    Call MD for:  severe uncontrolled pain    Complete by:  As directed    Call MD for:  temperature >100.4    Complete by:  As directed    Diet - low sodium heart healthy    Complete by:  As directed    Increase activity slowly    Complete by:  As directed       Signed: Ledell Noss, MD 08/31/2016, 7:39 AM   Pager: [phone]

## 2016-08-27 NOTE — Discharge Instructions (Signed)
How to Rondo your Penrose drain in place and it will be taken out in the office. You can shower with open area covered and then redress after shower. Introduction A dressing is a material that is placed in and over wounds. A dressing helps your wound to heal by protecting it from:  Bacteria.  Worse injury.  Being too dry or too wet. What are the risks? The sticky (adhesive) tape that is used with a dressing may make your skin sore or irritated, or it may cause a rash. These are the most common problems. However, more serious problems can develop, such as:  Bleeding.  Infection. How to change your dressing Getting Ready to Change Your Dressing   Take a shower before you do the first dressing change of the day. If your doctor does not want your wound to get wet and your dressing is not waterproof, you may need to put plastic leak-proof sealing wrap on your dressing to protect it.  If needed, take pain medicine as told by your doctor 30 minutes before you change your dressing.  Set up a clean station for wound care. You will need:  A plastic trash bag that is open and ready to use.  Hand sanitizer.  Wound cleanser or salt-water solution (saline) as told by your doctor.  New dressing material or bandages. Make sure to open the dressing package so the dressing stays on the inside of the package. You may also need these supplies in your clean station:  A box of vinyl gloves.  Tape.  Skin protectant. This may be a wipe, film, or spray.  Clean or germ-free (sterile) scissors.  A cotton-tipped applicator. Taking Off Your Old Dressing  Wash your hands with soap and water. Dry your hands with a clean towel. If you cannot use soap and water, use hand sanitizer.  If you are using gloves, put on the gloves before you take off the dressing.  Gently take off any adhesive or tape by pulling it off in the direction of your hair growth. Only touch the outside edges  of the dressing.  Take off the dressing. If the dressing sticks to your skin, wet the dressing with a germ-free salt-water solution. This helps it come off more easily.  Take off any gauze or packing in your wound.  Throw the old dressing supplies into the ready trash bag.  Take off your gloves. To take off each glove, grab the cuff with your other hand and turn the glove inside out. Put the gloves in the trash right away.  Wash your hands with soap and water. Dry your hands with a clean towel. If you cannot use soap and water, use hand sanitizer. Cleaning Your Wound  Follow instructions from your doctor about how to clean your wound. This may include using a salt-water solution or recommended wound cleanser.  Do not use over-the-counter medicated or antiseptic creams, sprays, liquids, or dressings unless your doctor tells you to do that.  Use a clean gauze pad to clean the area fully with the salt-water solution or wound cleanser that your doctor recommends.  Throw the gauze pad into the trash bag.  Wash your hands with soap and water. Dry your hands with a clean towel. If you cannot use soap and water, use hand sanitizer. Putting on the Dressing  If your doctor recommended a skin protectant, put it on the skin around the wound.  Cover the wound with the recommended dressing, such  as a nonstick gauze or bandage. Make sure to touch only the outside edges of the dressing. Do not touch the inside of the dressing.  Attach the dressing so all sides stay in place. You may do this with the attached medical adhesive, roll gauze, or tape. If you use tape, do not wrap the tape all the way around your arm or leg.  Take off your gloves. Put them in the trash bag with the old dressing. Tie the bag shut and throw it away.  Wash your hands with soap and water. Dry your hands with a clean towel. If you cannot use soap and water, use hand sanitizer. Get help if:   You have new pain.  You have  irritation, a rash, or itching around the wound or dressing.  Changing your dressing is painful.  Changing your dressing causes a lot of bleeding. Get help right away if:  You have very bad pain.  You have signs of infection, such as:  More redness, swelling, or pain.  More fluid or blood.  Warmth.  Pus or a bad smell.  Red streaks leading from wound.  A fever. This information is not intended to replace advice given to you by your health care provider. Make sure you discuss any questions you have with your health care provider. Document Released: 11/02/2008 Document Revised: 01/12/2016 Document Reviewed: 05/12/2015  2017 [employer]vier   Thank you for trusting [place] with your medical care!  You were hospitalized for abdominal wall abscess and treated with incision and drainage and antibiotics.   Please take note of the following changes to your medications: START taking Augmentin 875 mg twice daily  START taking ferrous sulfate daily (this is to help with your anemia)   To make sure you are getting better, please make it to the follow-up appointments listed on the first page.  If you have any questions, please call [phone].

## 2016-08-27 NOTE — Progress Notes (Signed)
D/C papers gone over with pt. Prescription given to pt. Other prescriptions sent to pt.'s pharmacy. NO questions/complaints. Portacath de-accessed by IV nurse earlier. Pt. Waiting for husband to come and pick her up.

## 2016-08-28 DIAGNOSIS — M545 Low back pain: Secondary | ICD-10-CM | POA: Diagnosis not present

## 2016-08-28 DIAGNOSIS — D509 Iron deficiency anemia, unspecified: Secondary | ICD-10-CM | POA: Diagnosis not present

## 2016-08-28 DIAGNOSIS — E669 Obesity, unspecified: Secondary | ICD-10-CM | POA: Diagnosis not present

## 2016-08-28 DIAGNOSIS — B9620 Unspecified Escherichia coli [E. coli] as the cause of diseases classified elsewhere: Secondary | ICD-10-CM | POA: Diagnosis not present

## 2016-08-28 DIAGNOSIS — Z87891 Personal history of nicotine dependence: Secondary | ICD-10-CM | POA: Diagnosis not present

## 2016-08-28 DIAGNOSIS — T814XXD Infection following a procedure, subsequent encounter: Secondary | ICD-10-CM | POA: Diagnosis not present

## 2016-08-28 DIAGNOSIS — Z7901 Long term (current) use of anticoagulants: Secondary | ICD-10-CM | POA: Diagnosis not present

## 2016-08-28 DIAGNOSIS — Z8701 Personal history of pneumonia (recurrent): Secondary | ICD-10-CM | POA: Diagnosis not present

## 2016-08-28 DIAGNOSIS — I10 Essential (primary) hypertension: Secondary | ICD-10-CM | POA: Diagnosis not present

## 2016-08-28 DIAGNOSIS — K219 Gastro-esophageal reflux disease without esophagitis: Secondary | ICD-10-CM | POA: Diagnosis not present

## 2016-08-28 DIAGNOSIS — L03311 Cellulitis of abdominal wall: Secondary | ICD-10-CM | POA: Diagnosis not present

## 2016-08-28 DIAGNOSIS — I480 Paroxysmal atrial fibrillation: Secondary | ICD-10-CM | POA: Diagnosis not present

## 2016-08-28 DIAGNOSIS — J45909 Unspecified asthma, uncomplicated: Secondary | ICD-10-CM | POA: Diagnosis not present

## 2016-08-28 DIAGNOSIS — L02211 Cutaneous abscess of abdominal wall: Secondary | ICD-10-CM | POA: Diagnosis not present

## 2016-08-28 DIAGNOSIS — K5900 Constipation, unspecified: Secondary | ICD-10-CM | POA: Diagnosis not present

## 2016-08-28 DIAGNOSIS — M1991 Primary osteoarthritis, unspecified site: Secondary | ICD-10-CM | POA: Diagnosis not present

## 2016-08-28 DIAGNOSIS — Z85038 Personal history of other malignant neoplasm of large intestine: Secondary | ICD-10-CM | POA: Diagnosis not present

## 2016-08-28 LAB — ANAEROBIC CULTURE

## 2016-08-30 DIAGNOSIS — T814XXD Infection following a procedure, subsequent encounter: Secondary | ICD-10-CM | POA: Diagnosis not present

## 2016-08-30 DIAGNOSIS — L02211 Cutaneous abscess of abdominal wall: Secondary | ICD-10-CM | POA: Diagnosis not present

## 2016-08-30 DIAGNOSIS — I10 Essential (primary) hypertension: Secondary | ICD-10-CM | POA: Diagnosis not present

## 2016-08-30 DIAGNOSIS — I480 Paroxysmal atrial fibrillation: Secondary | ICD-10-CM | POA: Diagnosis not present

## 2016-08-30 DIAGNOSIS — L03311 Cellulitis of abdominal wall: Secondary | ICD-10-CM | POA: Diagnosis not present

## 2016-08-30 DIAGNOSIS — B9620 Unspecified Escherichia coli [E. coli] as the cause of diseases classified elsewhere: Secondary | ICD-10-CM | POA: Diagnosis not present

## 2016-09-03 ENCOUNTER — Ambulatory Visit (HOSPITAL_COMMUNITY)
Admission: RE | Admit: 2016-09-03 | Discharge: 2016-09-03 | Disposition: A | Discharge status: Home / Self Care | Payer: Medicare Other | Source: Ambulatory Visit | Attending: Nurse Practitioner | Admitting: Nurse Practitioner

## 2016-09-03 VITALS — BP 126/60 | HR 52 | Ht 62.0 in | Wt 276.2 lb

## 2016-09-03 DIAGNOSIS — I10 Essential (primary) hypertension: Secondary | ICD-10-CM | POA: Diagnosis not present

## 2016-09-03 DIAGNOSIS — I480 Paroxysmal atrial fibrillation: Secondary | ICD-10-CM | POA: Diagnosis not present

## 2016-09-03 DIAGNOSIS — R001 Bradycardia, unspecified: Secondary | ICD-10-CM | POA: Diagnosis not present

## 2016-09-03 DIAGNOSIS — Z7901 Long term (current) use of anticoagulants: Secondary | ICD-10-CM | POA: Insufficient documentation

## 2016-09-03 DIAGNOSIS — B9620 Unspecified Escherichia coli [E. coli] as the cause of diseases classified elsewhere: Secondary | ICD-10-CM | POA: Diagnosis not present

## 2016-09-03 DIAGNOSIS — Z880 Allergy status to penicillin: Secondary | ICD-10-CM | POA: Diagnosis not present

## 2016-09-03 DIAGNOSIS — L03311 Cellulitis of abdominal wall: Secondary | ICD-10-CM | POA: Diagnosis not present

## 2016-09-03 DIAGNOSIS — T814XXD Infection following a procedure, subsequent encounter: Secondary | ICD-10-CM | POA: Diagnosis not present

## 2016-09-03 DIAGNOSIS — L02211 Cutaneous abscess of abdominal wall: Secondary | ICD-10-CM | POA: Diagnosis not present

## 2016-09-03 DIAGNOSIS — Z79899 Other long term (current) drug therapy: Secondary | ICD-10-CM | POA: Diagnosis not present

## 2016-09-03 DIAGNOSIS — I4891 Unspecified atrial fibrillation: Secondary | ICD-10-CM | POA: Diagnosis present

## 2016-09-03 DIAGNOSIS — Z87891 Personal history of nicotine dependence: Secondary | ICD-10-CM | POA: Diagnosis not present

## 2016-09-03 NOTE — Progress Notes (Signed)
Primary Care Physician: [name], MD Referring Physician:  [facility] f/u Cardiologist: Dr. Merdis Delay Quant is a 66 y.o. female with a h/o afib that is in the afib clinic for evaluation. She has a h/o afib dating back 10 years ago. She has been on flecainide x one year. She was in the hospital for treatment of abdominal wall abscess again since I saw her last with repeat surgery. She is on antibiotics. She did not have any further afib during surgery and has been staying in SR. Abscess is a complication of the surgery for  incarcerated ventral hernia repair in 04/2016.  She has h/o colon cancer treated in 2015 and gastric bypass. Has lost 12 lbs form recent surgery and trying to eat better.  Rate control meds were  increased over a month ago and she is doing much better with no afib noted . She continues on eliquis. Historically, when she does break through with afib, episodes are less than 4 hours.  Today, she denies symptoms of palpitations, chest pain, shortness of breath, orthopnea, PND, lower extremity edema, dizziness, presyncope, syncope, or neurologic sequela. The patient is tolerating medications without difficulties and is otherwise without complaint today.   Past Medical History:  Diagnosis Date  . Arthritis    "throughout my body" (08/23/2016)  . Asthma   . Atrial fibrillation ([place])   . Cervical cancer ([place]) 1972  . Chronic lower back pain   . Colon cancer ([place]) 2015  . GERD (gastroesophageal reflux disease)   . H/O gastric bypass   . History of blood transfusion 1972; 1990s   "both w/OR"  . Hyperlipidemia   . Hypertension   . Insomnia   . Morbid obesity ([place]) 03/04/2016  . OSA (obstructive sleep apnea)    "can't afford the machine" (08/23/2016)  . Pneumonia    "probably 4 times in early childhood" (08/23/2016)   Past Surgical History:  Procedure Laterality Date  . ABDOMINAL HERNIA REPAIR  1990s  . BOWEL RESECTION  2015   [place], [place] by Dr. Stormy Fabian  .  CARDIAC CATHETERIZATION  05/2014   in [place], no sig CAD (done prior to surgery for colon CA)  . [place]   "twins"  . COLON SURGERY    . DILATION AND CURETTAGE OF UTERUS  1960s X 1  . GASTRIC BYPASS  1992  . HERNIA REPAIR    . INSERTION OF MESH N/A 05/01/2016   Procedure: INSERTION OF MESH;  Surgeon: Excell Seltzer, MD;  Location: [place];  Service: General;  Laterality: N/A;  . IR GENERIC HISTORICAL  06/28/2016   IR RADIOLOGIST EVAL & MGMT 06/28/2016 Markus Daft, MD [facility] INTERV RAD  . IR GENERIC HISTORICAL  08/07/2016   IR RADIOLOGIST EVAL & MGMT 08/07/2016 [facility] INTERV RAD  . LAPAROSCOPIC GASTRIC BANDING     Placed 2013 and removed in 2014.  Marland Kitchen TOTAL ABDOMINAL HYSTERECTOMY  1998   For tumorous growth.  . VENTRAL HERNIA REPAIR N/A 05/01/2016   Procedure: VENTRAL HERNIA REPAIR;  Surgeon: Excell Seltzer, MD;  Location: [place];  Service: General;  Laterality: N/A;  . VENTRAL HERNIA REPAIR N/A 08/24/2016   Procedure: IRRIGATION AND DEBRIDEMENT OF ABDOMINAL WALL ABCESS;  Surgeon: Stark Klein, MD;  Location: [place];  Service: General;  Laterality: N/A;    Current Outpatient Prescriptions  Medication Sig Dispense Refill  . acetaminophen (TYLENOL) 500 MG tablet Take 1,000 mg by mouth every 6 (six) hours as needed for mild pain or  moderate pain.    Marland Kitchen acetaminophen-codeine (TYLENOL #3) 300-30 MG tablet Take 1-2 tablets by mouth every 4 (four) hours as needed for moderate pain. 30 tablet 0  . albuterol (PROVENTIL HFA;VENTOLIN HFA) 108 (90 Base) MCG/ACT inhaler Inhale 1-2 puffs into the lungs every 6 (six) hours as needed for wheezing or shortness of breath.    Marland Kitchen amoxicillin-clavulanate (AUGMENTIN) 875-125 MG tablet Take 1 tablet by mouth 2 (two) times daily. Stop date 09/02/2016 14 tablet 0  . apixaban (ELIQUIS) 5 MG TABS tablet Take 1 tablet (5 mg total) by mouth 2 (two) times daily. 180 tablet 1  . atorvastatin (LIPITOR) 10 MG tablet take 1 tablet by mouth once daily 90  tablet 1  . budesonide-formoterol (SYMBICORT) 160-4.5 MCG/ACT inhaler Inhale 2 puffs into the lungs 2 (two) times daily. 1 Inhaler 1  . Calcium-Phosphorus-Vitamin D (CALCIUM/VITAMIN D3/ADULT GUMMY PO) Take 2 each by mouth daily.    . carvedilol (COREG) 12.5 MG tablet Take 1 tablet (12.5 mg total) by mouth 2 (two) times daily with a meal. 90 tablet 2  . Cholecalciferol (VITAMIN D3) 2000 units CHEW Chew 2,000 Units by mouth 2 (two) times daily.    . cyanocobalamin 500 MCG tablet Take 1,000 mcg by mouth daily.     Marland Kitchen diltiazem (CARDIZEM CD) 120 MG 24 hr capsule Take 1 capsule (120 mg total) by mouth daily. 90 capsule 1  . diltiazem (CARDIZEM) 30 MG tablet Take 1 tablet every 4 hours AS NEEDED for AFIB fast heart rate >100 as long as blood pressure >100. 45 tablet 3  . docusate sodium (COLACE) 100 MG capsule Take 1 capsule (100 mg total) by mouth 2 (two) times daily as needed for mild constipation. 10 capsule 0  . ferrous sulfate 325 (65 FE) MG tablet Take 1 tablet (325 mg total) by mouth daily with breakfast. 30 tablet 3  . flecainide (TAMBOCOR) 100 MG tablet take 1 tablet by mouth twice a day 60 tablet 2  . HYDROcodone-acetaminophen (NORCO/VICODIN) 5-325 MG tablet Take 1-2 tablets by mouth every 4 (four) hours as needed. (Patient not taking: Reported on 08/23/2016) 20 tablet 0  . lisinopril (PRINIVIL,ZESTRIL) 5 MG tablet take 1 tablet by mouth once daily 30 tablet 11  . loperamide (IMODIUM) 2 MG capsule Take 2 mg by mouth daily as needed for diarrhea or loose stools.    . Multiple Vitamins-Minerals (ALIVE WOMENS GUMMY) CHEW Chew 2 each by mouth daily.    Marland Kitchen omeprazole (PRILOSEC) 40 MG capsule Take 1 capsule (40 mg total) by mouth daily. 90 capsule 1  . oxyCODONE (OXY IR/ROXICODONE) 5 MG immediate release tablet Take 1-2 tablets (5-10 mg total) by mouth every 6 (six) hours as needed ('5mg'$  for moderate pain, '10mg'$  for severe pain). 30 tablet 0  . potassium chloride (K-DUR) 10 MEQ tablet 2 TABLETS BY MOUTH  DAILY 60 tablet 5  . prochlorperazine (COMPAZINE) 10 MG tablet Take 10 mg by mouth every 6 (six) hours as needed for nausea or vomiting.    . traZODone (DESYREL) 100 MG tablet Take 100 mg by mouth at bedtime.  0   No current facility-administered medications for this encounter.     Allergies  Allergen Reactions  . Augmentin [Amoxicillin-Pot Clavulanate] Diarrhea  . Scallops [Shellfish Allergy] Hives  . Vancomycin Other (See Comments)    Pt began coughing and c/o tightening of chest/difficulty breathing and flushing during Vanc infusion.  Reports she never wants to have Vancomycin again even if it can be prevented with  rate reduction  . Barium-Containing Compounds     Feels like cement in the body    Social History   Social History  . Marital status: Married    Spouse name: N/A  . Number of children: N/A  . Years of education: N/A   Occupational History  . Not on file.   Social History Main Topics  . Smoking status: Former Smoker    Packs/day: 1.00    Years: 45.00    Types: Cigarettes  . Smokeless tobacco: Never Used     Comment: "quit smoking mid 2000s"  . Alcohol use 0.0 oz/week     Comment: 08/23/2016 "a beer every now and then; none in over 1 year"  . Drug use: No  . Sexual activity: No   Other Topics Concern  . Not on file   Social History Narrative  . No narrative on file    Family History  Problem Relation Age of Onset  . Heart disease Mother   . Diabetes Mother   . Heart disease Father   . Stroke Father   . Diabetes Father   . Diabetes Sister   . Thyroid disease Sister   . Early death Brother     Killed by drunk driver  . Heart attack Brother   . Thyroid disease Daughter     s/p thyroidectomy  . Diabetes Sister   . Thyroid disease Sister   . Stroke Brother   . Drug abuse Brother   . Heart attack Brother     ROS- All systems are reviewed and negative except as per the HPI above  Physical Exam: There were no vitals filed for this visit. Wt  Readings from Last 3 Encounters:  08/24/16 284 lb 6.3 oz (129 kg)  08/11/16 284 lb 6.3 oz (129 kg)  07/30/16 284 lb 6.4 oz (129 kg)    Labs: Lab Results  Component Value Date   NA 135 08/26/2016   K 4.4 08/26/2016   CL 101 08/26/2016   CO2 24 08/26/2016   GLUCOSE 120 (H) 08/26/2016   BUN 15 08/26/2016   CREATININE 1.02 (H) 08/26/2016   CALCIUM 8.3 (L) 08/26/2016   MG 1.7 06/11/2016   Lab Results  Component Value Date   INR 1.15 08/11/2016   Lab Results  Component Value Date   CHOL 227 (H) 01/24/2016   HDL 50 01/24/2016   LDLCALC 151 (H) 01/24/2016   TRIG 131 01/24/2016     GEN- The patient is well appearing, alert and oriented x 3 today.   Head- normocephalic, atraumatic Eyes-  Sclera clear, conjunctiva pink Ears- hearing intact Oropharynx- clear Neck- supple, no JVP Lymph- no cervical lymphadenopathy Lungs- Clear to ausculation bilaterally, normal work of breathing Heart- Regular rate and rhythm, no murmurs, rubs or gallops, PMI not laterally displaced GI- soft, NT, ND, + BS Extremities- no clubbing, cyanosis, or edema MS- no significant deformity or atrophy Skin- no rash or lesion Psych- euthymic mood, full affect Neuro- strength and sensation are intact  EKG- NSR , normal EKG Epic records reviewed Echo-- Left ventricle: The cavity size was normal. Wall thickness was   normal. Systolic function was normal. The estimated ejection   fraction was in the range of 55% to 60%. Wall motion was normal;   there were no regional wall motion abnormalities. Doppler   parameters are consistent with abnormal left ventricular   relaxation (grade 1 diastolic dysfunction). - Aortic valve: There was no stenosis. - Mitral valve: Mildly calcified annulus.  There was no significant   regurgitation. - Left atrium: The atrium was mildly dilated. 40 mm. - Right ventricle: The cavity size was normal. Systolic function   was normal. - Pulmonary arteries: No complete TR doppler  jet so unable to   estimate PA systolic pressure. - Inferior vena cava: The vessel was normal in size. The   respirophasic diameter changes were in the normal range (>= 50%),   consistent with normal central venous pressure.  Impressions:  - Normal LV size with EF 55-60%. Normal RV size and systolic   function. No significant valvular abnormalities.     Assessment and Plan: 1. PAF Better on increased rate control of carvedilol and recent addition of cardizem  Continue flecainide 100 mg bid  Continue eliquis for chadsvasc score of at least 2  Will give short acting cardizem 30 mg to use as needed for breakthrough afib  2. Lifestyle issues   Has sleep apnea but does not use cpap Sleeps in a recliner which has stopped apnea spells and snoring per husband Morbid obesity- encouraged weight loss Has orthopedic issues which keeps her from regular exercise No alcohol, tobacco or excessive caffeine  F/u prn Dr. Oval Linsey as scheduled  Geroge Baseman. Ethne Jeon, [facility] [address] [name], [place] [address] [phone]

## 2016-09-06 ENCOUNTER — Encounter: Payer: Medicare Other | Admitting: Internal Medicine

## 2016-09-10 DIAGNOSIS — L02211 Cutaneous abscess of abdominal wall: Secondary | ICD-10-CM | POA: Diagnosis not present

## 2016-09-10 DIAGNOSIS — I10 Essential (primary) hypertension: Secondary | ICD-10-CM | POA: Diagnosis not present

## 2016-09-10 DIAGNOSIS — L03311 Cellulitis of abdominal wall: Secondary | ICD-10-CM | POA: Diagnosis not present

## 2016-09-10 DIAGNOSIS — I480 Paroxysmal atrial fibrillation: Secondary | ICD-10-CM | POA: Diagnosis not present

## 2016-09-10 DIAGNOSIS — T814XXD Infection following a procedure, subsequent encounter: Secondary | ICD-10-CM | POA: Diagnosis not present

## 2016-09-10 DIAGNOSIS — B9620 Unspecified Escherichia coli [E. coli] as the cause of diseases classified elsewhere: Secondary | ICD-10-CM | POA: Diagnosis not present

## 2016-09-13 ENCOUNTER — Telehealth: Payer: Self-pay | Admitting: Internal Medicine

## 2016-09-13 DIAGNOSIS — B9620 Unspecified Escherichia coli [E. coli] as the cause of diseases classified elsewhere: Secondary | ICD-10-CM | POA: Diagnosis not present

## 2016-09-13 DIAGNOSIS — T814XXD Infection following a procedure, subsequent encounter: Secondary | ICD-10-CM | POA: Diagnosis not present

## 2016-09-13 DIAGNOSIS — I480 Paroxysmal atrial fibrillation: Secondary | ICD-10-CM | POA: Diagnosis not present

## 2016-09-13 DIAGNOSIS — L03311 Cellulitis of abdominal wall: Secondary | ICD-10-CM | POA: Diagnosis not present

## 2016-09-13 DIAGNOSIS — L02211 Cutaneous abscess of abdominal wall: Secondary | ICD-10-CM | POA: Diagnosis not present

## 2016-09-13 DIAGNOSIS — I10 Essential (primary) hypertension: Secondary | ICD-10-CM | POA: Diagnosis not present

## 2016-09-13 NOTE — Telephone Encounter (Signed)
yes

## 2016-09-13 NOTE — Telephone Encounter (Signed)
[facility] calls and request PT for assess and treat, VO given for PT order, do you agree?

## 2016-09-13 NOTE — Telephone Encounter (Signed)
Request pt eval

## 2016-09-17 DIAGNOSIS — I10 Essential (primary) hypertension: Secondary | ICD-10-CM | POA: Diagnosis not present

## 2016-09-17 DIAGNOSIS — L02211 Cutaneous abscess of abdominal wall: Secondary | ICD-10-CM | POA: Diagnosis not present

## 2016-09-17 DIAGNOSIS — T814XXD Infection following a procedure, subsequent encounter: Secondary | ICD-10-CM | POA: Diagnosis not present

## 2016-09-17 DIAGNOSIS — I480 Paroxysmal atrial fibrillation: Secondary | ICD-10-CM | POA: Diagnosis not present

## 2016-09-17 DIAGNOSIS — L03311 Cellulitis of abdominal wall: Secondary | ICD-10-CM | POA: Diagnosis not present

## 2016-09-17 DIAGNOSIS — B9620 Unspecified Escherichia coli [E. coli] as the cause of diseases classified elsewhere: Secondary | ICD-10-CM | POA: Diagnosis not present

## 2016-09-21 DIAGNOSIS — L03311 Cellulitis of abdominal wall: Secondary | ICD-10-CM | POA: Diagnosis not present

## 2016-09-21 DIAGNOSIS — L02211 Cutaneous abscess of abdominal wall: Secondary | ICD-10-CM | POA: Diagnosis not present

## 2016-09-21 DIAGNOSIS — T814XXD Infection following a procedure, subsequent encounter: Secondary | ICD-10-CM | POA: Diagnosis not present

## 2016-09-21 DIAGNOSIS — B9620 Unspecified Escherichia coli [E. coli] as the cause of diseases classified elsewhere: Secondary | ICD-10-CM | POA: Diagnosis not present

## 2016-09-21 DIAGNOSIS — I10 Essential (primary) hypertension: Secondary | ICD-10-CM | POA: Diagnosis not present

## 2016-09-21 DIAGNOSIS — I480 Paroxysmal atrial fibrillation: Secondary | ICD-10-CM | POA: Diagnosis not present

## 2016-09-22 ENCOUNTER — Inpatient Hospital Stay (HOSPITAL_COMMUNITY)
Admission: EM | Admit: 2016-09-22 | Discharge: 2016-09-26 | DRG: 857 | Disposition: A | Discharge status: Home Health | Payer: Medicare Other | Attending: Internal Medicine | Admitting: Internal Medicine

## 2016-09-22 ENCOUNTER — Emergency Department (HOSPITAL_COMMUNITY): Payer: Medicare Other

## 2016-09-22 ENCOUNTER — Encounter (HOSPITAL_COMMUNITY): Payer: Self-pay | Admitting: Emergency Medicine

## 2016-09-22 DIAGNOSIS — Z87891 Personal history of nicotine dependence: Secondary | ICD-10-CM

## 2016-09-22 DIAGNOSIS — T814XXA Infection following a procedure, initial encounter: Principal | ICD-10-CM | POA: Diagnosis present

## 2016-09-22 DIAGNOSIS — Z880 Allergy status to penicillin: Secondary | ICD-10-CM | POA: Diagnosis not present

## 2016-09-22 DIAGNOSIS — D638 Anemia in other chronic diseases classified elsewhere: Secondary | ICD-10-CM | POA: Diagnosis present

## 2016-09-22 DIAGNOSIS — R7303 Prediabetes: Secondary | ICD-10-CM | POA: Diagnosis present

## 2016-09-22 DIAGNOSIS — Z9049 Acquired absence of other specified parts of digestive tract: Secondary | ICD-10-CM

## 2016-09-22 DIAGNOSIS — Z6841 Body Mass Index (BMI) 40.0 and over, adult: Secondary | ICD-10-CM

## 2016-09-22 DIAGNOSIS — K219 Gastro-esophageal reflux disease without esophagitis: Secondary | ICD-10-CM | POA: Diagnosis present

## 2016-09-22 DIAGNOSIS — Z833 Family history of diabetes mellitus: Secondary | ICD-10-CM | POA: Diagnosis not present

## 2016-09-22 DIAGNOSIS — G4733 Obstructive sleep apnea (adult) (pediatric): Secondary | ICD-10-CM | POA: Diagnosis present

## 2016-09-22 DIAGNOSIS — Z7901 Long term (current) use of anticoagulants: Secondary | ICD-10-CM | POA: Diagnosis not present

## 2016-09-22 DIAGNOSIS — J45909 Unspecified asthma, uncomplicated: Secondary | ICD-10-CM | POA: Diagnosis not present

## 2016-09-22 DIAGNOSIS — I480 Paroxysmal atrial fibrillation: Secondary | ICD-10-CM | POA: Diagnosis present

## 2016-09-22 DIAGNOSIS — Z90710 Acquired absence of both cervix and uterus: Secondary | ICD-10-CM

## 2016-09-22 DIAGNOSIS — Z85038 Personal history of other malignant neoplasm of large intestine: Secondary | ICD-10-CM | POA: Diagnosis present

## 2016-09-22 DIAGNOSIS — Z9884 Bariatric surgery status: Secondary | ICD-10-CM

## 2016-09-22 DIAGNOSIS — Z8249 Family history of ischemic heart disease and other diseases of the circulatory system: Secondary | ICD-10-CM | POA: Diagnosis not present

## 2016-09-22 DIAGNOSIS — E785 Hyperlipidemia, unspecified: Secondary | ICD-10-CM | POA: Diagnosis present

## 2016-09-22 DIAGNOSIS — K9189 Other postprocedural complications and disorders of digestive system: Secondary | ICD-10-CM | POA: Diagnosis not present

## 2016-09-22 DIAGNOSIS — Z79899 Other long term (current) drug therapy: Secondary | ICD-10-CM | POA: Diagnosis not present

## 2016-09-22 DIAGNOSIS — Z888 Allergy status to other drugs, medicaments and biological substances status: Secondary | ICD-10-CM | POA: Diagnosis not present

## 2016-09-22 DIAGNOSIS — Z9889 Other specified postprocedural states: Secondary | ICD-10-CM | POA: Diagnosis not present

## 2016-09-22 DIAGNOSIS — Z881 Allergy status to other antibiotic agents status: Secondary | ICD-10-CM | POA: Diagnosis not present

## 2016-09-22 DIAGNOSIS — K651 Peritoneal abscess: Secondary | ICD-10-CM | POA: Diagnosis not present

## 2016-09-22 DIAGNOSIS — L02211 Cutaneous abscess of abdominal wall: Secondary | ICD-10-CM | POA: Diagnosis present

## 2016-09-22 DIAGNOSIS — Y838 Other surgical procedures as the cause of abnormal reaction of the patient, or of later complication, without mention of misadventure at the time of the procedure: Secondary | ICD-10-CM | POA: Diagnosis present

## 2016-09-22 DIAGNOSIS — Z8541 Personal history of malignant neoplasm of cervix uteri: Secondary | ICD-10-CM

## 2016-09-22 DIAGNOSIS — I10 Essential (primary) hypertension: Secondary | ICD-10-CM | POA: Diagnosis present

## 2016-09-22 DIAGNOSIS — B9689 Other specified bacterial agents as the cause of diseases classified elsewhere: Secondary | ICD-10-CM | POA: Diagnosis not present

## 2016-09-22 DIAGNOSIS — I700 Atherosclerosis of aorta: Secondary | ICD-10-CM | POA: Diagnosis not present

## 2016-09-22 DIAGNOSIS — Z91013 Allergy to seafood: Secondary | ICD-10-CM

## 2016-09-22 HISTORY — DX: Cutaneous abscess of abdominal wall: L02.211

## 2016-09-22 LAB — CBC WITH DIFFERENTIAL/PLATELET
BASOS PCT: 0 %
Basophils Absolute: 0 10*3/uL (ref 0.0–0.1)
EOS PCT: 1 %
Eosinophils Absolute: 0.1 10*3/uL (ref 0.0–0.7)
HEMATOCRIT: 32.1 % — AB (ref 36.0–46.0)
Hemoglobin: 9.8 g/dL — ABNORMAL LOW (ref 12.0–15.0)
LYMPHS ABS: 1.9 10*3/uL (ref 0.7–4.0)
Lymphocytes Relative: 16 %
MCH: 20.4 pg — ABNORMAL LOW (ref 26.0–34.0)
MCHC: 30.5 g/dL (ref 30.0–36.0)
MCV: 66.7 fL — ABNORMAL LOW (ref 78.0–100.0)
MONOS PCT: 8 %
Monocytes Absolute: 0.9 10*3/uL (ref 0.1–1.0)
NEUTROS ABS: 8.7 10*3/uL — AB (ref 1.7–7.7)
Neutrophils Relative %: 75 %
Platelets: 218 10*3/uL (ref 150–400)
RBC: 4.81 MIL/uL (ref 3.87–5.11)
RDW: 21.4 % — AB (ref 11.5–15.5)
WBC: 11.6 10*3/uL — ABNORMAL HIGH (ref 4.0–10.5)

## 2016-09-22 LAB — COMPREHENSIVE METABOLIC PANEL
ALT: 20 U/L (ref 14–54)
AST: 23 U/L (ref 15–41)
Albumin: 3.3 g/dL — ABNORMAL LOW (ref 3.5–5.0)
Alkaline Phosphatase: 63 U/L (ref 38–126)
Anion gap: 10 (ref 5–15)
BILIRUBIN TOTAL: 0.5 mg/dL (ref 0.3–1.2)
BUN: 18 mg/dL (ref 6–20)
CALCIUM: 8.7 mg/dL — AB (ref 8.9–10.3)
CO2: 21 mmol/L — ABNORMAL LOW (ref 22–32)
CREATININE: 0.83 mg/dL (ref 0.44–1.00)
Chloride: 107 mmol/L (ref 101–111)
GFR calc Af Amer: >60 mL/min (ref >60)
GFR calc non Af Amer: >60 mL/min (ref >60)
Glucose, Bld: 105 mg/dL — ABNORMAL HIGH (ref 65–99)
POTASSIUM: 3.9 mmol/L (ref 3.5–5.1)
Sodium: 138 mmol/L (ref 135–145)
Total Protein: 6.2 g/dL — ABNORMAL LOW (ref 6.5–8.1)

## 2016-09-22 MED ORDER — MORPHINE SULFATE (PF) 4 MG/ML IV SOLN
4.0000 mg | Freq: Once | INTRAVENOUS | Status: AC
  Administered 2016-09-22: 4 mg via INTRAVENOUS
  Filled 2016-09-22: qty 1

## 2016-09-22 MED ORDER — PANTOPRAZOLE SODIUM 40 MG PO TBEC
40.0000 mg | DELAYED_RELEASE_TABLET | Freq: Every day | ORAL | Status: DC
  Administered 2016-09-23 – 2016-09-26 (×3): 40 mg via ORAL
  Filled 2016-09-22 (×3): qty 1

## 2016-09-22 MED ORDER — DILTIAZEM HCL ER COATED BEADS 120 MG PO CP24
120.0000 mg | ORAL_CAPSULE | Freq: Every day | ORAL | Status: DC
  Administered 2016-09-23 – 2016-09-26 (×4): 120 mg via ORAL
  Filled 2016-09-22 (×4): qty 1

## 2016-09-22 MED ORDER — MOMETASONE FURO-FORMOTEROL FUM 200-5 MCG/ACT IN AERO
2.0000 | INHALATION_SPRAY | Freq: Two times a day (BID) | RESPIRATORY_TRACT | Status: DC
  Administered 2016-09-23 – 2016-09-26 (×5): 2 via RESPIRATORY_TRACT
  Filled 2016-09-22: qty 8.8

## 2016-09-22 MED ORDER — PIPERACILLIN-TAZOBACTAM 3.375 G IVPB 30 MIN
3.3750 g | Freq: Once | INTRAVENOUS | Status: AC
  Administered 2016-09-22: 3.375 g via INTRAVENOUS
  Filled 2016-09-22: qty 50

## 2016-09-22 MED ORDER — ACETAMINOPHEN 500 MG PO TABS
1000.0000 mg | ORAL_TABLET | Freq: Four times a day (QID) | ORAL | Status: DC | PRN
  Administered 2016-09-23 – 2016-09-24 (×2): 1000 mg via ORAL
  Filled 2016-09-22 (×2): qty 2

## 2016-09-22 MED ORDER — HYDROMORPHONE HCL 2 MG/ML IJ SOLN
1.0000 mg | Freq: Once | INTRAMUSCULAR | Status: AC
  Administered 2016-09-22: 1 mg via INTRAVENOUS
  Filled 2016-09-22: qty 1

## 2016-09-22 MED ORDER — CARVEDILOL 12.5 MG PO TABS
12.5000 mg | ORAL_TABLET | Freq: Two times a day (BID) | ORAL | Status: DC
  Administered 2016-09-22 – 2016-09-26 (×8): 12.5 mg via ORAL
  Filled 2016-09-22 (×8): qty 1

## 2016-09-22 MED ORDER — LIDOCAINE-EPINEPHRINE (PF) 2 %-1:200000 IJ SOLN
10.0000 mL | Freq: Once | INTRAMUSCULAR | Status: DC
  Filled 2016-09-22: qty 20

## 2016-09-22 MED ORDER — ONDANSETRON HCL 4 MG/2ML IJ SOLN
4.0000 mg | Freq: Once | INTRAMUSCULAR | Status: AC
  Administered 2016-09-22: 4 mg via INTRAVENOUS
  Filled 2016-09-22: qty 2

## 2016-09-22 MED ORDER — ONDANSETRON HCL 4 MG/2ML IJ SOLN: 4.0000 mg | Freq: Three times a day (TID) | INTRAMUSCULAR | Status: AC | PRN

## 2016-09-22 MED ORDER — TRAZODONE HCL 100 MG PO TABS
100.0000 mg | ORAL_TABLET | Freq: Every day | ORAL | Status: DC
  Administered 2016-09-22 – 2016-09-25 (×4): 100 mg via ORAL
  Filled 2016-09-22 (×4): qty 1

## 2016-09-22 MED ORDER — ALBUTEROL SULFATE (2.5 MG/3ML) 0.083% IN NEBU: 3.0000 mL | INHALATION_SOLUTION | Freq: Four times a day (QID) | RESPIRATORY_TRACT | Status: DC | PRN

## 2016-09-22 MED ORDER — MORPHINE SULFATE (PF) 2 MG/ML IV SOLN
2.0000 mg | INTRAVENOUS | Status: DC | PRN
  Administered 2016-09-22 – 2016-09-24 (×9): 2 mg via INTRAVENOUS
  Filled 2016-09-22 (×9): qty 1

## 2016-09-22 MED ORDER — FLECAINIDE ACETATE 100 MG PO TABS
100.0000 mg | ORAL_TABLET | Freq: Two times a day (BID) | ORAL | Status: DC
  Administered 2016-09-22 – 2016-09-26 (×8): 100 mg via ORAL
  Filled 2016-09-22 (×8): qty 1

## 2016-09-22 MED ORDER — LISINOPRIL 5 MG PO TABS
5.0000 mg | ORAL_TABLET | Freq: Every day | ORAL | Status: DC
  Filled 2016-09-22: qty 1

## 2016-09-22 MED ORDER — PIPERACILLIN-TAZOBACTAM 3.375 G IVPB
3.3750 g | Freq: Three times a day (TID) | INTRAVENOUS | Status: DC
  Filled 2016-09-22: qty 50

## 2016-09-22 MED ORDER — ATORVASTATIN CALCIUM 10 MG PO TABS
10.0000 mg | ORAL_TABLET | Freq: Every day | ORAL | Status: DC
  Administered 2016-09-23 – 2016-09-25 (×3): 10 mg via ORAL
  Filled 2016-09-22 (×3): qty 1

## 2016-09-22 MED ORDER — IOPAMIDOL (ISOVUE-300) INJECTION 61%
INTRAVENOUS | Status: AC
  Administered 2016-09-22: 100 mL
  Filled 2016-09-22: qty 100

## 2016-09-22 MED ORDER — HEPARIN SODIUM (PORCINE) 5000 UNIT/ML IJ SOLN
5000.0000 [IU] | Freq: Three times a day (TID) | INTRAMUSCULAR | Status: AC
  Administered 2016-09-23 (×3): 5000 [IU] via SUBCUTANEOUS
  Filled 2016-09-22 (×3): qty 1

## 2016-09-22 MED ORDER — SODIUM CHLORIDE 0.9 % IV SOLN
3.0000 g | Freq: Four times a day (QID) | INTRAVENOUS | Status: DC
  Administered 2016-09-22 – 2016-09-26 (×15): 3 g via INTRAVENOUS
  Filled 2016-09-22 (×16): qty 3

## 2016-09-22 MED ORDER — DILTIAZEM HCL 60 MG PO TABS: 30.0000 mg | ORAL_TABLET | Freq: Three times a day (TID) | ORAL | Status: DC | PRN

## 2016-09-22 NOTE — ED Triage Notes (Signed)
Pt. Stated, I have a recurrent abscess on my right side of my abdomen  This is the 3rd one in the same area,. I've been running a fever off and on since this one came on.

## 2016-09-22 NOTE — Progress Notes (Signed)
Pharmacy Antibiotic Note  Suzanne Macdonald is a 66 y.o. female admitted on 09/22/2016 with recurrent abdominal wall abscess.  Pharmacy has been consulted for zosyn dosing. -Zosyn 3.375gm given at 6:30pm -CrCl ~ 80  Plan: -Zosyn 3.375gm IV q8h -No zosyn adjustments anticipated Will sign off. Please contact pharmacy with any other needs.   Height: 5' (152.4 cm) Weight: 274 lb (124.3 kg) IBW/kg (Calculated) : 45.5  Temp (24hrs), Avg:98.6 F (37 C), Min:98.6 F (37 C), Max:98.6 F (37 C)   Recent Labs Lab 09/22/16 1357  WBC 11.6*  CREATININE 0.83    Estimated Creatinine Clearance: 82.1 mL/min (by C-G formula based on SCr of 0.83 mg/dL).    Allergies  Allergen Reactions  . Augmentin [Amoxicillin-Pot Clavulanate] Diarrhea  . Scallops [Shellfish Allergy] Hives  . Vancomycin Other (See Comments)    Pt began coughing and c/o tightening of chest/difficulty breathing and flushing during Vanc infusion.  Reports she never wants to have Vancomycin again even if it can be prevented with rate reduction  . Barium-Containing Compounds Other (See Comments)    Feels like cement in the body     Thank you for allowing pharmacy to be a part of this patient's care.  Hildred Laser, Pharm D 09/22/2016 8:10 PM

## 2016-09-22 NOTE — H&P (Signed)
Date: 09/22/2016               Patient Name:  Suzanne Macdonald MRN: [id number]  DOB: 04-May-1951 Age / Sex: 66 y.o., female   PCP: Bartholomew Crews, MD         Medical Service: Internal Medicine Teaching Service         Attending Physician: Dr. Oval Linsey, MD    First Contact: Dr. Lovena Le Pager: [phone]  Second Contact: Dr. Posey Pronto Pager: [phone]       After Hours (After 5p/  First Contact Pager: [phone]  weekends / holidays): Second Contact Pager: [phone]   Chief Complaint: abdominal wall pain, swelling  History of Present Illness:  Suzanne Macdonald is a 66 year old with MHx significant for morbid obesity, ventral hernia repair 11/971 complicated by recurrent abdominal wall abscesses, HTN, PAF on Eliquis, hx of cervical cancer in 1972 s/p TAH who presents for evaluation of abdominal wall pain and redness. [name] reports that she was in her usual state of health until she awoke Saturday morning with pain, erythema and a "fullness" between 2 of her abdominal wall scars. Endorses fevers at home (T-max 100.2*), chills, HA and fatigue over the past several days. Had a follow-up appointment with her surgeon earlier in the week and seemed to be recovering well at that time. She denies any purulent drainage from surgical sites, nausea, vomiting, changes in bowel habits.   The patient had repair of an incarcerated large ventral hernia 05/01/16. She was re-admitted 06/09/16 with an abdominal wall abscess. Percutaneous drain was placed by IR and cultures obtained grew lactobacillus and yeast. She was subsequently treated with a 4 week course of IV Ceftriaxone and Fluconazole ending November 17th. She was again readmitted December 23-26th with abdominal wall cellulitis overlaying the surgical site and a small recurrent 2.5 cm abscess. While hospitalized she was treated with Zosyn and Clindamycin and discharged home with a 2 week course of PO Bactrim. She was again readmitted 08/23/16-08/27/16  for recurrent abdominal wall abscess. She was taken to the OR 1/5 for debridement which revealed a multiseptate abscess. Penrose drain was left in place and the approximately 3 cm wound was left open to drain by secondary intention. Cultures obtained grew enterococcus fecalis, sensitive to ampicillin and vancomycin. She was treated with Zosyn while hospitalized and discharged home with a 2 week course of Augmentin.   Vital signs were stable in the ED (98.6*, pulse 64, BP 135/48, respirations 18 and she was saturating 100% on room air). CMET revealed low bicarb at 21 and low albumin at 3.3. CBC significant for leukocytosis at 11.6 and a stable chronic microcytic anemia.  CT abdomen/pelvis was obtained showing recurrent right lateral abdominal wall abscess, measuring 6.8 x 3.7 cm.  Evaluated by general surgery who believe she requires aggressive I&D however will wait approximately 48 hours given the patients last Eliquis dose was the morning of 2/3. She was subsequently given IV Zosyn and admitted for management of abdominal wall abscess.   Meds:  Current Meds  Medication Sig  . acetaminophen (TYLENOL) 500 MG tablet Take 1,000 mg by mouth every 6 (six) hours as needed for mild pain or moderate pain.  Marland Kitchen albuterol (PROVENTIL HFA;VENTOLIN HFA) 108 (90 Base) MCG/ACT inhaler Inhale 1-2 puffs into the lungs every 6 (six) hours as needed for wheezing or shortness of breath.  Marland Kitchen apixaban (ELIQUIS) 5 MG TABS tablet Take 1 tablet (5 mg total) by mouth 2 (two) times  daily.  . atorvastatin (LIPITOR) 10 MG tablet take 1 tablet by mouth once daily  . budesonide-formoterol (SYMBICORT) 160-4.5 MCG/ACT inhaler Inhale 2 puffs into the lungs 2 (two) times daily. (Patient taking differently: Inhale 2 puffs into the lungs 2 (two) times daily as needed (for shortness of breath). )  . Calcium-Phosphorus-Vitamin D (CALCIUM/VITAMIN D3/ADULT GUMMY PO) Take 2 each by mouth daily.  . carvedilol (COREG) 12.5 MG tablet Take 1 tablet  (12.5 mg total) by mouth 2 (two) times daily with a meal.  . Cholecalciferol (VITAMIN D3) 2000 units CHEW Chew 2,000 Units by mouth 2 (two) times daily.  . cyanocobalamin 500 MCG tablet Take 1,000 mcg by mouth daily.   Marland Kitchen diltiazem (CARDIZEM CD) 120 MG 24 hr capsule Take 1 capsule (120 mg total) by mouth daily.  Marland Kitchen diltiazem (CARDIZEM) 30 MG tablet Take 1 tablet every 4 hours AS NEEDED for AFIB fast heart rate >100 as long as blood pressure >100.  Marland Kitchen docusate sodium (COLACE) 100 MG capsule Take 1 capsule (100 mg total) by mouth 2 (two) times daily as needed for mild constipation.  . flecainide (TAMBOCOR) 100 MG tablet take 1 tablet by mouth twice a day  . lisinopril (PRINIVIL,ZESTRIL) 5 MG tablet take 1 tablet by mouth once daily  . Multiple Vitamins-Minerals (ALIVE WOMENS GUMMY) CHEW Chew 2 each by mouth daily.  Marland Kitchen omeprazole (PRILOSEC) 40 MG capsule Take 1 capsule (40 mg total) by mouth daily.  . potassium chloride (K-DUR) 10 MEQ tablet 2 TABLETS BY MOUTH DAILY (Patient taking differently: Take 20 mEq by mouth daily. )  . traZODone (DESYREL) 100 MG tablet Take 100 mg by mouth at bedtime.   Allergies: Allergies as of 09/22/2016 - Review Complete 09/22/2016  Allergen Reaction Noted  . Augmentin [amoxicillin-pot clavulanate] Diarrhea 07/11/2016  . Scallops [shellfish allergy] Hives 06/09/2016  . Vancomycin Anaphylaxis  08/11/2016  . Barium-containing compounds Other (See Comments) 07/30/2016   Past Medical History:  Diagnosis Date  . Arthritis    "throughout my body" (08/23/2016)  . Asthma   . Atrial fibrillation ([place])   . Cervical cancer ([place]) 1972  . Chronic lower back pain   . Colon cancer ([place]) 2015  . GERD (gastroesophageal reflux disease)   . H/O gastric bypass   . History of blood transfusion 1972; 1990s   "both w/OR"  . Hyperlipidemia   . Hypertension   . Insomnia   . Morbid obesity ([place]) 03/04/2016  . OSA (obstructive sleep apnea)    "can't afford the machine" (08/23/2016)  .  Pneumonia    "probably 4 times in early childhood" (08/23/2016)   Family History:  Mother: Deceased, MI. Also had history of DM Father: Deceased, CVA. Also had history of DM and CAD.  Two sisters with thyroid disease and diabetes.  Two (deceased) brothers with heart attacks.   Social History: Former smoker with a 45 pack-year history, quit in 2000's. Denied any recent alcohol use and at most drank a beer occasionally. Denied any drug use. Lives at home with her husband.  Review of Systems: A complete ROS was negative except as per HPI.   Physical Exam: Blood pressure (!) 137/53, pulse 64, temperature 98.6 F (37 C), temperature source Oral, resp. rate 18, height 5' (1.524 m), weight 274 lb (124.3 kg), SpO2 99 %.  General: Morbidly obese caucasian woman resting comfortably in bed. In no acute distress. Pleasant and open to interview. HENT: PERRL. EOMI. No conjunctival injection, icterus or ptosis. Oropharynx clear, mucous membranes moist.  Cardiovascular: Regular rate and rhythm. No murmur or rub appreciated. Pulmonary: CTA BL, no wheezing, crackles or rhonchi appreciated. Unlabored breathing.  Abdomen: Soft, obese abdomen. Inspection reveals multiple well-healed linear abdominal wall scars. One open superficial scar draining scant serous fluid (site of recent perC tube). RLQ with erythema and tenderness overlaying an approximate 5x4 cm area of fluctuance. No guarding or rigidity. +bowel sounds. Extremities: No peripheral edema noted BL. Intact distal pulses. No gross deformities. Skin: Warm, dry. No cyanosis.  Neuro: Strength and sensation grossly intact. Alert and oriented x3.  Psych: Mood normal and affect was mood congruent. Responds to questions appropriately.   CT abdomen/pelvis with contrast: Recurrent right lateral abdominal wall abscess, 6.8 cm x 3.7cm.   CMP Latest Ref Rng & Units 09/22/2016 08/26/2016 08/24/2016  Glucose 65 - 99 mg/dL 105(H) 120(H) 108(H)  BUN 6 - 20 mg/dL '18 15 14    '$ Creatinine 0.44 - 1.00 mg/dL 0.83 1.02(H) 1.04(H)  Sodium 135 - 145 mmol/L 138 135 135  Potassium 3.5 - 5.1 mmol/L 3.9 4.4 4.2  Chloride 101 - 111 mmol/L 107 101 104  CO2 22 - 32 mmol/L 21(L) 24 23  Calcium 8.9 - 10.3 mg/dL 8.7(L) 8.3(L) 8.5(L)  Total Protein 6.5 - 8.1 g/dL 6.2(L) - -  Total Bilirubin 0.3 - 1.2 mg/dL 0.5 - -  Alkaline Phos 38 - 126 U/L 63 - -  AST 15 - 41 U/L 23 - -  ALT 14 - 54 U/L 20 - -   CBC    Component Value Date/Time   WBC 11.6 (H) 09/22/2016 1357   RBC 4.81 09/22/2016 1357   HGB 9.8 (L) 09/22/2016 1357   HCT 32.1 (L) 09/22/2016 1357   HCT 37.3 01/24/2016 0952   PLT 218 09/22/2016 1357   PLT 279 01/24/2016 0952   MCV 66.7 (L) 09/22/2016 1357   MCV 77 (L) 01/24/2016 0952   MCH 20.4 (L) 09/22/2016 1357   MCHC 30.5 09/22/2016 1357   RDW 21.4 (H) 09/22/2016 1357   RDW 18.4 (H) 01/24/2016 0952   LYMPHSABS 1.9 09/22/2016 1357   MONOABS 0.9 09/22/2016 1357   EOSABS 0.1 09/22/2016 1357   BASOSABS 0.0 09/22/2016 1357    Assessment & Plan by Problem: Mrs. Monika Chestang is a 66 year old woman who presents with an abdominal wall abscess.   Principal Problem:   Abdominal wall abscess Active Problems:   Essential hypertension   History of gastric bypass   History of colon cancer   History of cervical cancer   Asthma   Aortic atherosclerosis (HCC)  Abdominal Wall Abscess: 66 year old morbidly-obese woman with history of recurrent abdominal wall abscesses following large ventral hernia repair 04/2016 who presents with another recurrent abscess. Seen by general surgery who will perform aggressive I&D likely Monday. Afebrile, not tachycardic and without leukocytosis. Cultures have historically grown E. faecalis (sensitive to Ampicillin and Vancomycin), yeast and pansensitive E.coli.  Given 1 dose of Zosyn in ED, started on empiric therapy with Unasyn with assistance of pharmacy.  -IV Ampicillin-Sulbactam 3g Q6 per pharmacy recs -Pain control with IV Morphine 2  mg Q4H PRN severe pain -Gen surg will likely I&D Monday, NPO after midnight 2/4.  -CBC and BMET in AM  Paroxysmal Atrial Fibrillation: Anticoagulated with Eliquis 5 mg BID. Also on Flecainide 100 mg BID, Coreg 12.5 mg, and with Cardizem 120 mg daily. Also as PRN Cardizem 30 mg for tachycardia. Have held Eliquis in setting of upcoming I&D -Will need to ensure resumption  of Eliquis at d/c -Continued rate control as above  Essential Hypertension: Compliant with Lisinopril 10 mg which has been continued.  Asthma: Could not find PFTs in system however patient is well controlled at home with Symbicort and albuterol PRN which have been continued.   Hyperlipidemia: Continued Lipitor 10 mg. Most recent lipid panel 01/2016 showed a total cholesterol of 227, HDL 50 and LDL 151  Antibiotics: Unasyn Consults: Gen surg IVF: nsl DVT/PE ppx: Heparin TID ppx. Will need to be held prior to surgery. Diet: HH. NPO at MN 2/4.  Code status: FULL  Dispo: Admit patient to Inpatient with expected length of stay greater than 2 midnights.  SignedEinar Gip, DO 09/22/2016, 7:38 PM  Pager: [phone]

## 2016-09-22 NOTE — ED Notes (Signed)
Pt up and mobile to bathroom

## 2016-09-22 NOTE — Consult Note (Signed)
Surgical Consultation Requesting provider: Dr. Ashok Cordia  CC: recurrent abdominal wall abscess  HPI: 66yo woman well-known to our service s/p urgent repair of incarcerated hernia in September and subsequent re-admissions for RLQ soft tissue infections most recently s/p I&D one month ago. She had seen Dr. Excell Seltzer earlier this week and seemed to be recovering well without further infection but this morning awoke with pain in the site between her two prior RLQ wounds. Reports fevers at home. She last took her eliquis this morning.   Allergies  Allergen Reactions  . Augmentin [Amoxicillin-Pot Clavulanate] Diarrhea  . Scallops [Shellfish Allergy] Hives  . Vancomycin Other (See Comments)    Pt began coughing and c/o tightening of chest/difficulty breathing and flushing during Vanc infusion.  Reports she never wants to have Vancomycin again even if it can be prevented with rate reduction  . Barium-Containing Compounds Other (See Comments)    Feels like cement in the body    Past Medical History:  Diagnosis Date  . Arthritis    "throughout my body" (08/23/2016)  . Asthma   . Atrial fibrillation ([place])   . Cervical cancer ([place]) 1972  . Chronic lower back pain   . Colon cancer ([place]) 2015  . GERD (gastroesophageal reflux disease)   . H/O gastric bypass   . History of blood transfusion 1972; 1990s   "both w/OR"  . Hyperlipidemia   . Hypertension   . Insomnia   . Morbid obesity ([place]) 03/04/2016  . OSA (obstructive sleep apnea)    "can't afford the machine" (08/23/2016)  . Pneumonia    "probably 4 times in early childhood" (08/23/2016)    Past Surgical History:  Procedure Laterality Date  . ABDOMINAL HERNIA REPAIR  1990s  . BOWEL RESECTION  2015   [place], [place] by Dr. Stormy Fabian  . CARDIAC CATHETERIZATION  05/2014   in [place], no sig CAD (done prior to surgery for colon CA)  . [place]   "twins"  . COLON SURGERY    . DILATION AND CURETTAGE OF UTERUS  1960s X 1  . GASTRIC  BYPASS  1992  . HERNIA REPAIR    . INSERTION OF MESH N/A 05/01/2016   Procedure: INSERTION OF MESH;  Surgeon: Excell Seltzer, MD;  Location: [facility];  Service: General;  Laterality: N/A;  . IR GENERIC HISTORICAL  06/28/2016   IR RADIOLOGIST EVAL & MGMT 06/28/2016 Markus Daft, MD [facility] INTERV RAD  . IR GENERIC HISTORICAL  08/07/2016   IR RADIOLOGIST EVAL & MGMT 08/07/2016 [facility] INTERV RAD  . LAPAROSCOPIC GASTRIC BANDING     Placed 2013 and removed in 2014.  Marland Kitchen TOTAL ABDOMINAL HYSTERECTOMY  1998   For tumorous growth.  . VENTRAL HERNIA REPAIR N/A 05/01/2016   Procedure: VENTRAL HERNIA REPAIR;  Surgeon: Excell Seltzer, MD;  Location: [place];  Service: General;  Laterality: N/A;  . VENTRAL HERNIA REPAIR N/A 08/24/2016   Procedure: IRRIGATION AND DEBRIDEMENT OF ABDOMINAL WALL ABCESS;  Surgeon: Stark Klein, MD;  Location: [facility];  Service: General;  Laterality: N/A;    Family History  Problem Relation Age of Onset  . Heart disease Mother   . Diabetes Mother   . Heart disease Father   . Stroke Father   . Diabetes Father   . Diabetes Sister   . Thyroid disease Sister   . Early death Brother     Killed by drunk driver  . Heart attack Brother   . Thyroid disease Daughter  s/p thyroidectomy  . Diabetes Sister   . Thyroid disease Sister   . Stroke Brother   . Drug abuse Brother   . Heart attack Brother     Social History   Social History  . Marital status: Married    Spouse name: N/A  . Number of children: N/A  . Years of education: N/A   Social History Main Topics  . Smoking status: Former Smoker    Packs/day: 1.00    Years: 45.00    Types: Cigarettes  . Smokeless tobacco: Never Used     Comment: "quit smoking mid 2000s"  . Alcohol use 0.0 oz/week     Comment: 08/23/2016 "a beer every now and then; none in over 1 year"  . Drug use: No  . Sexual activity: No   Other Topics Concern  . None   Social History Narrative  . None    No current facility-administered  medications on file prior to encounter.    Current Outpatient Prescriptions on File Prior to Encounter  Medication Sig Dispense Refill  . acetaminophen (TYLENOL) 500 MG tablet Take 1,000 mg by mouth every 6 (six) hours as needed for mild pain or moderate pain.    Marland Kitchen albuterol (PROVENTIL HFA;VENTOLIN HFA) 108 (90 Base) MCG/ACT inhaler Inhale 1-2 puffs into the lungs every 6 (six) hours as needed for wheezing or shortness of breath.    Marland Kitchen apixaban (ELIQUIS) 5 MG TABS tablet Take 1 tablet (5 mg total) by mouth 2 (two) times daily. 180 tablet 1  . atorvastatin (LIPITOR) 10 MG tablet take 1 tablet by mouth once daily 90 tablet 1  . budesonide-formoterol (SYMBICORT) 160-4.5 MCG/ACT inhaler Inhale 2 puffs into the lungs 2 (two) times daily. (Patient taking differently: Inhale 2 puffs into the lungs 2 (two) times daily as needed (for shortness of breath). ) 1 Inhaler 1  . Calcium-Phosphorus-Vitamin D (CALCIUM/VITAMIN D3/ADULT GUMMY PO) Take 2 each by mouth daily.    . carvedilol (COREG) 12.5 MG tablet Take 1 tablet (12.5 mg total) by mouth 2 (two) times daily with a meal. 90 tablet 2  . Cholecalciferol (VITAMIN D3) 2000 units CHEW Chew 2,000 Units by mouth 2 (two) times daily.    . cyanocobalamin 500 MCG tablet Take 1,000 mcg by mouth daily.     Marland Kitchen diltiazem (CARDIZEM CD) 120 MG 24 hr capsule Take 1 capsule (120 mg total) by mouth daily. 90 capsule 1  . diltiazem (CARDIZEM) 30 MG tablet Take 1 tablet every 4 hours AS NEEDED for AFIB fast heart rate >100 as long as blood pressure >100. 45 tablet 3  . docusate sodium (COLACE) 100 MG capsule Take 1 capsule (100 mg total) by mouth 2 (two) times daily as needed for mild constipation. 10 capsule 0  . flecainide (TAMBOCOR) 100 MG tablet take 1 tablet by mouth twice a day 60 tablet 2  . lisinopril (PRINIVIL,ZESTRIL) 5 MG tablet take 1 tablet by mouth once daily 30 tablet 11  . Multiple Vitamins-Minerals (ALIVE WOMENS GUMMY) CHEW Chew 2 each by mouth daily.    Marland Kitchen  omeprazole (PRILOSEC) 40 MG capsule Take 1 capsule (40 mg total) by mouth daily. 90 capsule 1  . potassium chloride (K-DUR) 10 MEQ tablet 2 TABLETS BY MOUTH DAILY (Patient taking differently: Take 20 mEq by mouth daily. ) 60 tablet 5  . traZODone (DESYREL) 100 MG tablet Take 100 mg by mouth at bedtime.  0  . acetaminophen-codeine (TYLENOL #3) 300-30 MG tablet Take 1-2 tablets by mouth  every 4 (four) hours as needed for moderate pain. (Patient not taking: Reported on 09/22/2016) 30 tablet 0  . amoxicillin-clavulanate (AUGMENTIN) 875-125 MG tablet Take 1 tablet by mouth 2 (two) times daily. Stop date 09/02/2016 (Patient not taking: Reported on 09/22/2016) 14 tablet 0  . ferrous sulfate 325 (65 FE) MG tablet Take 1 tablet (325 mg total) by mouth daily with breakfast. (Patient not taking: Reported on 09/22/2016) 30 tablet 3  . HYDROcodone-acetaminophen (NORCO/VICODIN) 5-325 MG tablet Take 1-2 tablets by mouth every 4 (four) hours as needed. (Patient not taking: Reported on 09/22/2016) 20 tablet 0  . loperamide (IMODIUM) 2 MG capsule Take 2 mg by mouth daily as needed for diarrhea or loose stools.    Marland Kitchen oxyCODONE (OXY IR/ROXICODONE) 5 MG immediate release tablet Take 1-2 tablets (5-10 mg total) by mouth every 6 (six) hours as needed ('5mg'$  for moderate pain, '10mg'$  for severe pain). (Patient not taking: Reported on 09/22/2016) 30 tablet 0  . prochlorperazine (COMPAZINE) 10 MG tablet Take 10 mg by mouth every 6 (six) hours as needed for nausea or vomiting.      Review of Systems: a complete, 10pt review of systems was completed with pertinent positives and negatives as documented in the HPI.   Physical Exam: Vitals:   09/22/16 1415 09/22/16 1600  BP: 152/65 142/62  Pulse: 64 65  Resp:  18  Temp:     Gen: A&Ox3, no distress  Head: normocephalic, atraumatic, EOMI, anicteric.  Neck: supple without mass or thyromegaly Chest: unlabored respirations, symmetrical   Cardiovascular: RRR with palpable distal  pulses Abdomen: obese, soft, focally tender with faint erythema and deep induration in the soft tissue of the RLQ between a well-healed scar and a more lateral healing wound.  Extremities: warm, without edema, no deformities  Neuro: grossly intact Psych: appropriate mood and affect, normal insight Skin: no other lesions or rashes on limited exam  CBC Latest Ref Rng & Units 09/22/2016 08/27/2016 08/26/2016  WBC 4.0 - 10.5 K/uL 11.6(H) 7.4 7.6  Hemoglobin 12.0 - 15.0 g/dL 9.8(L) 8.3(L) 7.7(L)  Hematocrit 36.0 - 46.0 % 32.1(L) 27.1(L) 25.4(L)  Platelets 150 - 400 K/uL 218 330 280    CMP Latest Ref Rng & Units 09/22/2016 08/26/2016 08/24/2016  Glucose 65 - 99 mg/dL 105(H) 120(H) 108(H)  BUN 6 - 20 mg/dL '18 15 14  '$ Creatinine 0.44 - 1.00 mg/dL 0.83 1.02(H) 1.04(H)  Sodium 135 - 145 mmol/L 138 135 135  Potassium 3.5 - 5.1 mmol/L 3.9 4.4 4.2  Chloride 101 - 111 mmol/L 107 101 104  CO2 22 - 32 mmol/L 21(L) 24 23  Calcium 8.9 - 10.3 mg/dL 8.7(L) 8.3(L) 8.5(L)  Total Protein 6.5 - 8.1 g/dL 6.2(L) - -  Total Bilirubin 0.3 - 1.2 mg/dL 0.5 - -  Alkaline Phos 38 - 126 U/L 63 - -  AST 15 - 41 U/L 23 - -  ALT 14 - 54 U/L 20 - -    Lab Results  Component Value Date   INR 1.15 08/11/2016   INR 1.23 07/12/2016   INR 1.28 06/11/2016    Imaging: CT confirms recurrent abscess  A/P: 65yo with complex surgical history and multiple recurrences of RLQ soft tissue infection. Has been perc-drained and then I&D'd recently. At this point I think it needs to be widely drained and debrided more aggressively. Unfortunately she is on eliquis which she took this morning- ideal to wait 48h before intervention to minimize bleeding and opportunity for hematoma formation/infection. She is  not toxic and has a mild leukocytosis at this point. Will keep her on IV abx and plan I&D in the next couple days. I discussed this with her and she understands that she may wind up with a larger wound that takes longer to heal, may need a  wound vac and/or home health wound care for dressing changes.    Romana Juniper, MD [facility], [place] Pager [phone]

## 2016-09-22 NOTE — ED Provider Notes (Signed)
[facility] DEPT Provider Note   CSN: [id number] Arrival date & time: 09/22/16  1148     History   Chief Complaint Chief Complaint  Patient presents with  . Abscess    HPI Suzanne Macdonald is a 66 y.o. female.  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 Pt reports last night while she was laying in bed she began to feel pain to her right mid abdomen. She notes this morning she was able to palpate an area of swelling with associated redness and warmth which is consistent with abscesses she has had in the past. Pt reports this abscess is next the the area she most recently had an abscess drained during her last hospital admission a few weeks ago. Pt reports she was d/c home on 08/27/16 on Augment x10d which she reports taking as prescribed. She notes she had a follow up appointment with her surgeon 2 days ago where she was "cleared" and reported to have good healing without any signs of infection to her abscess. Pt reports having fever (101) at home this morning. Endorses associated pain to area. Denies CP, SOB, N/V/D, constipation, urinary sxs, blood in urine or stool. Denies any drainage from new or old sites of abscesses. Denies any current use of antibiotics. Surgical hx includes bowel resection, hernia repair, hysterectomy, gastric bypass and abdominal wall abscess I&D.  Surgeon- Dr. Excell Seltzer  Past Medical History:  Diagnosis Date  . Arthritis    "throughout my body" (08/23/2016)  . Asthma   . Atrial fibrillation ([place])   . Cervical cancer ([place]) 1972  . Chronic lower back pain   . Colon cancer ([place]) 2015  . GERD (gastroesophageal reflux disease)   . H/O gastric bypass   . History of blood transfusion 1972; 1990s   "both w/OR"  . Hyperlipidemia   . Hypertension   . Insomnia   . Morbid obesity ([place])  03/04/2016  . OSA (obstructive sleep apnea)    "can't afford the machine" (08/23/2016)  . Pneumonia    "probably 4 times in early childhood" (08/23/2016)    Patient Active Problem List   Diagnosis Date Noted  . Iron deficiency anemia 08/24/2016  . Abdominal wall cellulitis 08/11/2016  . Atrial flutter ([place]) 07/12/2016  . Atrial fibrillation with RVR ([place]) 07/11/2016  . Fever   . At risk for surgical site infection   . Gram-negative infection   . Abdominal wall abscess 06/10/2016  . Aortic atherosclerosis ([place]) 05/09/2016  . Morbid obesity ([place]) 05/04/2016  . Incarcerated hernia 04/28/2016  . Osteopenia 03/15/2016  . Atrial fibrillation with tachycardic ventricular rate ([place])   . Asthma 03/02/2016  . Prediabetes 01/25/2016  . Essential hypertension 01/25/2016  . Vitamin D deficiency 01/25/2016  . 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 Vitamin B12 deficiency 01/25/2016  . Paroxysmal atrial fibrillation ([place]) 01/25/2016  . Health care maintenance 01/25/2016  . History of colon cancer 01/25/2016  . Ventral hernia 01/25/2016  . History of cervical cancer 01/25/2016    Past Surgical History:  Procedure Laterality Date  . ABDOMINAL HERNIA REPAIR  1990s  . BOWEL RESECTION  2015   [place], [place] by Dr. Stormy Fabian  . CARDIAC CATHETERIZATION  05/2014   in [place], no sig CAD (done prior to surgery for colon CA)  . CESAREAN SECTION  20   "twins"  . COLON SURGERY    . DILATION AND CURETTAGE OF UTERUS  1960s X 1  . GASTRIC BYPASS  1992  . HERNIA REPAIR    . INSERTION OF MESH N/A 05/01/2016   Procedure: INSERTION OF MESH;  Surgeon: Excell Seltzer, MD;  Location: [facility];  Service: General;  Laterality: N/A;  . IR GENERIC HISTORICAL  06/28/2016   IR RADIOLOGIST EVAL & MGMT 06/28/2016 Markus Daft, MD [facility] INTERV RAD  . IR GENERIC HISTORICAL  08/07/2016   IR RADIOLOGIST EVAL & MGMT 08/07/2016 [facility] INTERV RAD  . LAPAROSCOPIC GASTRIC BANDING     Placed 2013 and removed in 2014.    Marland Kitchen TOTAL ABDOMINAL HYSTERECTOMY  1998   For tumorous growth.  . VENTRAL HERNIA REPAIR N/A 05/01/2016   Procedure: VENTRAL HERNIA REPAIR;  Surgeon: Excell Seltzer, MD;  Location: [facility];  Service: General;  Laterality: N/A;  . VENTRAL HERNIA REPAIR N/A 08/24/2016   Procedure: IRRIGATION AND DEBRIDEMENT OF ABDOMINAL WALL ABCESS;  Surgeon: Stark Klein, MD;  Location: [facility];  Service: General;  Laterality: N/A;    OB History    No data available       Home Medications    Prior to Admission medications   Medication Sig Start Date End Date Taking? Authorizing Provider  acetaminophen (TYLENOL) 500 MG tablet Take 1,000 mg by mouth every 6 (six) hours as needed for mild pain or moderate pain.   Yes Historical Provider, MD  albuterol (PROVENTIL HFA;VENTOLIN HFA) 108 (90 Base) MCG/ACT inhaler Inhale 1-2 puffs into the lungs every 6 (six) hours as needed for wheezing or shortness of breath.   Yes Historical Provider, MD  apixaban (ELIQUIS) 5 MG TABS tablet Take 1 tablet (5 mg total) by mouth 2 (two) times daily. 03/13/16  Yes Shela Leff, MD  atorvastatin (LIPITOR) 10 MG tablet take 1 tablet by mouth once daily 04/04/16  Yes Shela Leff, MD  budesonide-formoterol [facility]) 160-4.5 MCG/ACT inhaler Inhale 2 puffs into the lungs 2 (two) times daily. Patient taking differently: Inhale 2 puffs into the lungs 2 (two) times daily as needed (for shortness of breath).  01/24/16  Yes Milagros Loll, MD  Calcium-Phosphorus-Vitamin D (CALCIUM/VITAMIN D3/ADULT GUMMY PO) Take 2 each by mouth daily.   Yes Historical Provider, MD  carvedilol (COREG) 12.5 MG tablet Take 1 tablet (12.5 mg total) by mouth 2 (two) times daily with a meal. 07/23/16  Yes Rhonda G Barrett, PA-C  Cholecalciferol (VITAMIN D3) 2000 units CHEW Chew 2,000 Units by mouth 2 (two) times daily.   Yes Historical Provider, MD  cyanocobalamin 500 MCG tablet Take 1,000 mcg by mouth daily.    Yes Historical Provider, MD  diltiazem (CARDIZEM  CD) 120 MG 24 hr capsule Take 1 capsule (120 mg total) by mouth daily. 07/13/16  Yes Alphonzo Grieve, MD  diltiazem (CARDIZEM) 30 MG tablet Take 1 tablet every 4 hours AS NEEDED for AFIB fast heart rate >100 as long as blood pressure >100. 07/30/16  Yes Sherran Needs, NP  docusate sodium (COLACE) 100 MG capsule Take 1 capsule (100 mg total) by mouth 2 (two) times daily as needed for mild constipation. 06/14/16  Yes Ledell Noss, MD  flecainide [facility]) 100 MG tablet take 1 tablet by mouth twice a day 08/06/16  Yes [name], MD  lisinopril (PRINIVIL,ZESTRIL) 5 MG tablet take 1 tablet by mouth once daily 07/27/16  Yes [name], MD  Multiple Vitamins-Minerals (ALIVE WOMENS GUMMY) CHEW Chew 2 each  by mouth daily.   Yes Historical Provider, MD  omeprazole (PRILOSEC) 40 MG capsule Take 1 capsule (40 mg total) by mouth daily. 04/27/16  Yes [name], MD  potassium chloride (K-DUR) 10 MEQ tablet 2 TABLETS BY MOUTH DAILY Patient taking differently: Take 20 mEq by mouth daily.  06/27/16  Yes [name], MD  traZODone (DESYREL) 100 MG tablet Take 100 mg by mouth at bedtime. 04/22/16  Yes Historical Provider, MD  acetaminophen-codeine (TYLENOL #3) 300-30 MG tablet Take 1-2 tablets by mouth every 4 (four) hours as needed for moderate pain. Patient not taking: Reported on 09/22/2016 08/13/16   Lorella Nimrod, MD  amoxicillin-clavulanate (AUGMENTIN) 875-125 MG tablet Take 1 tablet by mouth 2 (two) times daily. Stop date 09/02/2016 Patient not taking: Reported on 09/22/2016 08/27/16   Ledell Noss, MD  ferrous sulfate 325 (65 FE) MG tablet Take 1 tablet (325 mg total) by mouth daily with breakfast. Patient not taking: Reported on 09/22/2016 08/28/16   Ledell Noss, MD  HYDROcodone-acetaminophen (NORCO/VICODIN) 5-325 MG tablet Take 1-2 tablets by mouth every 4 (four) hours as needed. Patient not taking: Reported on 09/22/2016 08/10/16   Delos Haring, PA-C  loperamide (IMODIUM) 2 MG capsule Take 2 mg by mouth  daily as needed for diarrhea or loose stools.    Historical Provider, MD  oxyCODONE (OXY IR/ROXICODONE) 5 MG immediate release tablet Take 1-2 tablets (5-10 mg total) by mouth every 6 (six) hours as needed ('5mg'$  for moderate pain, '10mg'$  for severe pain). Patient not taking: Reported on 09/22/2016 08/27/16   Ledell Noss, MD  prochlorperazine (COMPAZINE) 10 MG tablet Take 10 mg by mouth every 6 (six) hours as needed for nausea or vomiting.    Historical Provider, MD    Family History Family History  Problem Relation Age of Onset  . Heart disease Mother   . Diabetes Mother   . Heart disease Father   . Stroke Father   . Diabetes Father   . Diabetes Sister   . Thyroid disease Sister   . Early death Brother     Killed by drunk driver  . Heart attack Brother   . Thyroid disease Daughter     s/p thyroidectomy  . Diabetes Sister   . Thyroid disease Sister   . Stroke Brother   . Drug abuse Brother   . Heart attack Brother     Social History Social History  Substance Use Topics  . Smoking status: Former Smoker    Packs/day: 1.00    Years: 45.00    Types: Cigarettes  . Smokeless tobacco: Never Used     Comment: "quit smoking mid 2000s"  . Alcohol use 0.0 oz/week     Comment: 08/23/2016 "a beer every now and then; none in over 1 year"     Allergies   Augmentin [amoxicillin-pot clavulanate]; Scallops [shellfish allergy]; Vancomycin; and Barium-containing compounds   Review of Systems Review of Systems  Constitutional: Positive for fever.  Gastrointestinal: Positive for abdominal pain.  Skin: Positive for color change (redness) and wound.  All other systems reviewed and are negative.    Physical Exam Updated Vital Signs BP 142/62   Pulse 65   Temp 98.6 F (37 C) (Oral)   Resp 18   Ht 5' (1.524 m)   Wt 124.3 kg   SpO2 99%   BMI 53.51 kg/m   Physical Exam  Constitutional: She is oriented to person, place, and time. She appears well-developed and well-nourished.  Morbidly  obese female  HENT:  Head: Normocephalic and atraumatic.  Eyes: Conjunctivae and EOM are normal. Right eye exhibits no discharge. Left eye exhibits no discharge. No scleral icterus.  Neck: Normal range of motion. Neck supple.  Cardiovascular: Normal rate, regular rhythm, normal heart sounds and intact distal pulses.   Pulmonary/Chest: Effort normal and breath sounds normal. No respiratory distress. She has no wheezes. She has no rales. She exhibits no tenderness.  Abdominal: Soft. Bowel sounds are normal. She exhibits no distension and no mass. There is tenderness. There is no rigidity, no rebound and no guarding. No hernia.    2x3cm area of induration present to right mid/lower abdominal wall with small area of erythema and warmth present. TTP. No drainage.   Multiple well healing surgical scars noted to abdominal wall. Well healing I&D wound present laterally to area of induration, no surrounding swelling, erythema, warmth or drainage present.   Musculoskeletal: She exhibits no edema.  Neurological: She is alert and oriented to person, place, and time.  Skin: Skin is warm and dry.  Nursing note and vitals reviewed.    ED Treatments / Results  Labs (all labs ordered are listed, but only abnormal results are displayed) Labs Reviewed  CBC WITH DIFFERENTIAL/PLATELET - Abnormal; Notable for the following:       Result Value   WBC 11.6 (*)    Hemoglobin 9.8 (*)    HCT 32.1 (*)    MCV 66.7 (*)    MCH 20.4 (*)    RDW 21.4 (*)    Neutro Abs 8.7 (*)    All other components within normal limits  COMPREHENSIVE METABOLIC PANEL - Abnormal; Notable for the following:    CO2 21 (*)    Glucose, Bld 105 (*)    Calcium 8.7 (*)    Total Protein 6.2 (*)    Albumin 3.3 (*)    All other components within normal limits    EKG  EKG Interpretation None       Radiology Ct Abdomen Pelvis W Contrast  Result Date: 09/22/2016 CLINICAL DATA:  Recurring abdominal wall abscess EXAM: CT ABDOMEN  AND PELVIS WITH CONTRAST TECHNIQUE: Multidetector CT imaging of the abdomen and pelvis was performed using the standard protocol following bolus administration of intravenous contrast. CONTRAST:  119m ISOVUE-300 IOPAMIDOL (ISOVUE-300) INJECTION 61% COMPARISON:  08/23/2016 FINDINGS: Lower chest: No acute abnormality. Hepatobiliary: No focal liver abnormality is seen. No gallstones, gallbladder wall thickening, or biliary dilatation. Pancreas: Unremarkable. No pancreatic ductal dilatation or surrounding inflammatory changes. Spleen: Normal in size without focal abnormality. Adrenals/Urinary Tract: Adrenal glands are unremarkable. Kidneys are normal, without renal calculi, focal lesion, or hydronephrosis. Bladder is unremarkable. Stomach/Bowel: Postsurgical changes are noted. No obstructive changes are seen. The appendix is within normal limits. Vascular/Lymphatic: Aortic atherosclerosis. No enlarged abdominal or pelvic lymph nodes. Reproductive: Status post hysterectomy. No adnexal masses. Other: No abdominal wall hernia is identified. There are inflammatory changes in the midline anteriorly consistent with prior postsurgical scarring. In the lateral right abdominal wall in the subcutaneous fat there is a 6.8 x 3.7 cm air-fluid collection consistent with recurrent abscess. It has reduced in size from the prior exam. Musculoskeletal: Mild degenerative change of the lumbar spine is noted. IMPRESSION: Recurrent right lateral abdominal wall abscess as described. The remainder of the exam is stable from the prior exam. Electronically Signed   By: MInez CatalinaM.D.   On: 09/22/2016 18:27    Procedures Procedures (including critical care time)  Medications Ordered in ED Medications  lidocaine-EPINEPHrine (XYLOCAINE W/EPI)  2 %-1:200000 (PF) injection 10 mL (not administered)  piperacillin-tazobactam (ZOSYN) IVPB 3.375 g (3.375 g Intravenous New Bag/Given 09/22/16 1834)  ondansetron (ZOFRAN) injection 4 mg (4 mg  Intravenous Given 09/22/16 1437)  morphine 4 MG/ML injection 4 mg (4 mg Intravenous Given 09/22/16 1437)  HYDROmorphone (DILAUDID) injection 1 mg (1 mg Intravenous Given 09/22/16 1701)  iopamidol (ISOVUE-300) 61 % injection (100 mLs  Contrast Given 09/22/16 1714)     Initial Impression / Assessment and Plan / ED Course  I have reviewed the triage vital signs and the nursing notes.  Pertinent labs & imaging results that were available during my care of the patient were reviewed by me and considered in my medical decision making (see chart for details).     Patient presents with recurrent abdominal wall abscess that started last night. Reports associated fever of 101 at home. Denies currently being on any antibiotics. VSS. Exam revealed 2x3cm area of induration present to right mid/lower abdominal wall with small area of erythema and warmth present. TTP. No drainage. Will order basic labs and given pt pain meds.  Chart review shows pt with hx of colon cancer s/p partial colectomy, s/p gastric bypass, with multiple ventral hernia repairs. Surgery on 04/2016 for incarcerated ventral hernia with repair complicated by an abdominal wall abscess which developed on 05/2016. Pt was again readmitted on 08/11/16 with abdominal wall cellulitis and recurrent abscess and tx with Zosyn and Clinda and d/c home on Bactrim. Pt was last admitted on 08/23/16 for recurrent abdominal wall abscess, started on Zosyn, surgery performed drainage and debridement and placed penrose and pt was d/c home on Augmentin x10d.   Discussed pt with Dr. Ashok Cordia who also evaluated pt in the ED. Plan to perform I&D in the ED for recurrent abdominal abscess. Pt refusing I&D. Due to pt with reported fever, mild leukocytosis and hx of recurrent abscesses, will consult general surgery. Pt started on IV Zosyn. Dr. Kae Heller evaluated pt it the ED. Plan to order CT abdomen for further evaluation. I was notified by Dr. Kae Heller that she had reviewed CT which  appeared to show recurrent right abdominal wall abscess. Plan to admit pt to medicine with plan of performing I&D in the OR this evening. Consulted hospitalist for admission.   Final Clinical Impressions(s) / ED Diagnoses   Final diagnoses:  Abdominal wall abscess    New Prescriptions New Prescriptions   No medications on file     Nona Dell, PA-C 09/22/16 [place], MD 09/23/16 [phone]

## 2016-09-22 NOTE — ED Notes (Signed)
MD at bedside. 

## 2016-09-23 DIAGNOSIS — I10 Essential (primary) hypertension: Secondary | ICD-10-CM

## 2016-09-23 DIAGNOSIS — Z881 Allergy status to other antibiotic agents status: Secondary | ICD-10-CM

## 2016-09-23 DIAGNOSIS — E785 Hyperlipidemia, unspecified: Secondary | ICD-10-CM

## 2016-09-23 DIAGNOSIS — Z833 Family history of diabetes mellitus: Secondary | ICD-10-CM

## 2016-09-23 DIAGNOSIS — Z7901 Long term (current) use of anticoagulants: Secondary | ICD-10-CM

## 2016-09-23 DIAGNOSIS — Z8249 Family history of ischemic heart disease and other diseases of the circulatory system: Secondary | ICD-10-CM

## 2016-09-23 DIAGNOSIS — Z91041 Radiographic dye allergy status: Secondary | ICD-10-CM

## 2016-09-23 DIAGNOSIS — Z91013 Allergy to seafood: Secondary | ICD-10-CM

## 2016-09-23 DIAGNOSIS — Z79899 Other long term (current) drug therapy: Secondary | ICD-10-CM

## 2016-09-23 DIAGNOSIS — Z8349 Family history of other endocrine, nutritional and metabolic diseases: Secondary | ICD-10-CM

## 2016-09-23 DIAGNOSIS — B9689 Other specified bacterial agents as the cause of diseases classified elsewhere: Secondary | ICD-10-CM

## 2016-09-23 DIAGNOSIS — I480 Paroxysmal atrial fibrillation: Secondary | ICD-10-CM

## 2016-09-23 DIAGNOSIS — Z87891 Personal history of nicotine dependence: Secondary | ICD-10-CM

## 2016-09-23 DIAGNOSIS — J45909 Unspecified asthma, uncomplicated: Secondary | ICD-10-CM

## 2016-09-23 DIAGNOSIS — K9189 Other postprocedural complications and disorders of digestive system: Secondary | ICD-10-CM

## 2016-09-23 DIAGNOSIS — L02211 Cutaneous abscess of abdominal wall: Secondary | ICD-10-CM

## 2016-09-23 LAB — BASIC METABOLIC PANEL
ANION GAP: 6 (ref 5–15)
BUN: 18 mg/dL (ref 6–20)
CALCIUM: 8.2 mg/dL — AB (ref 8.9–10.3)
CHLORIDE: 107 mmol/L (ref 101–111)
CO2: 24 mmol/L (ref 22–32)
Creatinine, Ser: 0.91 mg/dL (ref 0.44–1.00)
GFR calc non Af Amer: >60 mL/min (ref >60)
Glucose, Bld: 108 mg/dL — ABNORMAL HIGH (ref 65–99)
POTASSIUM: 3.5 mmol/L (ref 3.5–5.1)
Sodium: 137 mmol/L (ref 135–145)

## 2016-09-23 LAB — CBC
HEMATOCRIT: 27.9 % — AB (ref 36.0–46.0)
HEMOGLOBIN: 8.3 g/dL — AB (ref 12.0–15.0)
MCH: 19.8 pg — AB (ref 26.0–34.0)
MCHC: 29.7 g/dL — ABNORMAL LOW (ref 30.0–36.0)
MCV: 66.4 fL — AB (ref 78.0–100.0)
Platelets: 194 10*3/uL (ref 150–400)
RBC: 4.2 MIL/uL (ref 3.87–5.11)
RDW: 20.9 % — ABNORMAL HIGH (ref 11.5–15.5)
WBC: 9.6 10*3/uL (ref 4.0–10.5)

## 2016-09-23 LAB — SURGICAL PCR SCREEN
MRSA, PCR: NEGATIVE
Staphylococcus aureus: NEGATIVE

## 2016-09-23 MED ORDER — SODIUM CHLORIDE 0.9% FLUSH
10.0000 mL | INTRAVENOUS | Status: DC | PRN
  Administered 2016-09-24: 10 mL
  Filled 2016-09-23: qty 40

## 2016-09-23 NOTE — Progress Notes (Addendum)
   Subjective: No acute events overnight. Patient's pain is well-controlled. She denies nausea or vomiting. She denies fevers or chills. She had no questions this morning.  Objective:  Vital signs in last 24 hours: Vitals:   09/22/16 2000 09/22/16 2040 09/23/16 0622 09/23/16 0727  BP: 127/60 (!) 144/51 (!) 100/50   Pulse: 64 60 61   Resp:  20 19   Temp:  99 F (37.2 C) 98.6 F (37 C)   TempSrc:   Oral   SpO2: 100% 100% 100% 95%  Weight:      Height:       Physical Exam  Constitutional: She appears well-developed and well-nourished.  Obese  HENT:  Head: Normocephalic and atraumatic.  Cardiovascular: Normal rate and regular rhythm.  Exam reveals no gallop and no friction rub.   No murmur heard. Respiratory: Effort normal and breath sounds normal.  GI: Soft. Bowel sounds are normal. There is tenderness.  Tenderness to palpation in the right lower quadrant. Area of erythema which appears like cellulitis. One prior surgical incision that has been left to heal by primary intention with minimal serosanguineous drainage.     Assessment/Plan:  Principal Problem:   Abdominal wall abscess Active Problems:   Prediabetes   Essential hypertension   History of gastric bypass   Paroxysmal atrial fibrillation (HCC)   History of colon cancer   History of cervical cancer   Asthma   Morbid obesity ([place])   Aortic atherosclerosis ([place])  In summary, patient with recurrent abdominal wall abscess. Seen by general surgery and will be taken to the OR tomorrow. Will need to be nothing by mouth at midnight. Has no complaints today.  # Abdominal Wall Abscess  66 year old morbidly-obese woman with history of recurrent abdominal wall abscesses following large ventral hernia repair 04/2016 who presents with recurrent abscess. Seen by general surgery who will perform I&D likely Monday. Afebrile, not tachycardic and without leukocytosis. Cultures have historically grown E. faecalis (sensitive to  Ampicillin and Vancomycin), yeast and pansensitive E.coli.  Given 1 dose of Zosyn in ED, started on empiric therapy with Unasyn with assistance of pharmacy.  -IV Ampicillin-Sulbactam 3g Q6 per pharmacy recs -Pain control with IV Morphine 2 mg Q4H PRN severe pain -Gen surg will likely I&D Monday, NPO after midnight 2/4.  -CBC and BMET daily  #Paroxysmal Atrial Fibrillation  Anticoagulated with Eliquis 5 mg BID. Also on Flecainide 100 mg BID, Coreg 12.5 mg, and with Cardizem 120 mg daily. Also as PRN Cardizem 30 mg for tachycardia. Have held Eliquis in setting of upcoming I&D -Will need to ensure resumption of Eliquis at d/c -Continued rate control as above  # Essential Hypertension Most recently hypotensive at 100/50. Will stop lisinopril for now and will continue to monitor clinically.  - Hold daily lisinopril  #Asthma  -- Symbicort -- Albuterol when necessary   # Hyperlipidemia  -- Lipitor 10 mg   DVT/PE ppx: Heparin TID ppx. Will need to be held prior to surgery. Diet: HH. NPO at MN 2/4.  Code status: FULL  Dispo: Anticipated discharge pending operative course.  Ophelia Shoulder, MD 09/23/2016, 10:36 AM Pager: [phone]

## 2016-09-23 NOTE — Progress Notes (Signed)
Internal Medicine Attending  Date: 09/23/2016  Patient name: Suzanne Macdonald Medical record number: [id number] Date of birth: 31-Dec-1950 Age: 66 y.o. Gender: female  I saw and evaluated the patient. I reviewed the resident's note by Dr. Lovena Le and I agree with the resident's findings and plans as documented in his progress note.  Please see my H&P dated 09/23/2016 and attached to Dr. Rober Minion H&P dated 09/22/2016 for the specifics of my evaluation, assessment, and plan from earlier today.

## 2016-09-23 NOTE — Progress Notes (Signed)
Abdominal wall abscess  Subjective: Pt being treated for port site infection but keeps recurring.  Here for more definitive management.  No further events overnight  Objective: Vital signs in last 24 hours: Temp:  [98.6 F (37 C)-99 F (37.2 C)] 98.6 F (37 C) (02/04 0622) Pulse Rate:  [60-65] 61 (02/04 0622) Resp:  [18-20] 19 (02/04 0622) BP: (100-152)/(48-65) 100/50 (02/04 0622) SpO2:  [95 %-100 %] 95 % (02/04 0727) Weight:  [124.3 kg (274 lb)] 124.3 kg (274 lb) (02/03 1204) Last BM Date: 09/22/16  Intake/Output from previous day: 02/03 0701 - 02/04 0700 In: 610 [P.O.:360; IV Piggyback:250] Out: 250 [Urine:250] Intake/Output this shift: Total I/O In: -  Out: 200 [Urine:200]  General appearance: alert and cooperative Incision/Wound: cellulitis extends ~6cm around surgical site  Lab Results:  Results for orders placed or performed during the hospital encounter of 09/22/16 (from the past 24 hour(s))  CBC with Differential     Status: Abnormal   Collection Time: 09/22/16  1:57 PM  Result Value Ref Range   WBC 11.6 (H) 4.0 - 10.5 K/uL   RBC 4.81 3.87 - 5.11 MIL/uL   Hemoglobin 9.8 (L) 12.0 - 15.0 g/dL   HCT 32.1 (L) 36.0 - 46.0 %   MCV 66.7 (L) 78.0 - 100.0 fL   MCH 20.4 (L) 26.0 - 34.0 pg   MCHC 30.5 30.0 - 36.0 g/dL   RDW 21.4 (H) 11.5 - 15.5 %   Platelets 218 150 - 400 K/uL   Neutrophils Relative % 75 %   Lymphocytes Relative 16 %   Monocytes Relative 8 %   Eosinophils Relative 1 %   Basophils Relative 0 %   Neutro Abs 8.7 (H) 1.7 - 7.7 K/uL   Lymphs Abs 1.9 0.7 - 4.0 K/uL   Monocytes Absolute 0.9 0.1 - 1.0 K/uL   Eosinophils Absolute 0.1 0.0 - 0.7 K/uL   Basophils Absolute 0.0 0.0 - 0.1 K/uL   RBC Morphology ELLIPTOCYTES   Comprehensive metabolic panel     Status: Abnormal   Collection Time: 09/22/16  1:57 PM  Result Value Ref Range   Sodium 138 135 - 145 mmol/L   Potassium 3.9 3.5 - 5.1 mmol/L   Chloride 107 101 - 111 mmol/L   CO2 21 (L) 22 - 32 mmol/L    Glucose, Bld 105 (H) 65 - 99 mg/dL   BUN 18 6 - 20 mg/dL   Creatinine, Ser 0.83 0.44 - 1.00 mg/dL   Calcium 8.7 (L) 8.9 - 10.3 mg/dL   Total Protein 6.2 (L) 6.5 - 8.1 g/dL   Albumin 3.3 (L) 3.5 - 5.0 g/dL   AST 23 15 - 41 U/L   ALT 20 14 - 54 U/L   Alkaline Phosphatase 63 38 - 126 U/L   Total Bilirubin 0.5 0.3 - 1.2 mg/dL   GFR calc non Af Amer >60 >60 mL/min   GFR calc Af Amer >60 >60 mL/min   Anion gap 10 5 - 15  Basic metabolic panel     Status: Abnormal   Collection Time: 09/23/16  4:54 AM  Result Value Ref Range   Sodium 137 135 - 145 mmol/L   Potassium 3.5 3.5 - 5.1 mmol/L   Chloride 107 101 - 111 mmol/L   CO2 24 22 - 32 mmol/L   Glucose, Bld 108 (H) 65 - 99 mg/dL   BUN 18 6 - 20 mg/dL   Creatinine, Ser 0.91 0.44 - 1.00 mg/dL   Calcium 8.2 (L) 8.9 -  10.3 mg/dL   GFR calc non Af Amer >60 >60 mL/min   GFR calc Af Amer >60 >60 mL/min   Anion gap 6 5 - 15  CBC     Status: Abnormal   Collection Time: 09/23/16  4:54 AM  Result Value Ref Range   WBC 9.6 4.0 - 10.5 K/uL   RBC 4.20 3.87 - 5.11 MIL/uL   Hemoglobin 8.3 (L) 12.0 - 15.0 g/dL   HCT 27.9 (L) 36.0 - 46.0 %   MCV 66.4 (L) 78.0 - 100.0 fL   MCH 19.8 (L) 26.0 - 34.0 pg   MCHC 29.7 (L) 30.0 - 36.0 g/dL   RDW 20.9 (H) 11.5 - 15.5 %   Platelets 194 150 - 400 K/uL     Studies/Results Radiology     MEDS, Scheduled . ampicillin-sulbactam (UNASYN) IV  3 g Intravenous Q6H  . atorvastatin  10 mg Oral q1800  . carvedilol  12.5 mg Oral BID WC  . diltiazem  120 mg Oral Daily  . flecainide  100 mg Oral BID  . heparin  5,000 Units Subcutaneous Q8H  . lidocaine-EPINEPHrine  10 mL Infiltration Once  . lisinopril  5 mg Oral Daily  . mometasone-formoterol  2 puff Inhalation BID  . pantoprazole  40 mg Oral Q1200  . traZODone  100 mg Oral QHS     Assessment: Abdominal wall abscess   Plan: Cont to hold Eliquis Plan for wound exploration in OR tom    LOS: 1 day    Rosario Adie, MD [facility]  Surgery, [place] [phone]   09/23/2016 10:19 AM

## 2016-09-24 ENCOUNTER — Encounter (HOSPITAL_COMMUNITY)
Admission: EM | Disposition: A | Discharge status: Home Health | Payer: Self-pay | Source: Home / Self Care | Attending: Internal Medicine

## 2016-09-24 ENCOUNTER — Encounter (HOSPITAL_COMMUNITY): Payer: Self-pay | Admitting: General Practice

## 2016-09-24 ENCOUNTER — Inpatient Hospital Stay (HOSPITAL_COMMUNITY): Payer: Medicare Other | Admitting: Certified Registered"

## 2016-09-24 DIAGNOSIS — L02211 Cutaneous abscess of abdominal wall: Secondary | ICD-10-CM | POA: Diagnosis not present

## 2016-09-24 DIAGNOSIS — T814XXA Infection following a procedure, initial encounter: Secondary | ICD-10-CM | POA: Diagnosis not present

## 2016-09-24 HISTORY — PX: VACUUM ASSISTED CLOSURE CHANGE: SHX5227

## 2016-09-24 HISTORY — DX: Cutaneous abscess of abdominal wall: L02.211

## 2016-09-24 LAB — BASIC METABOLIC PANEL
ANION GAP: 4 — AB (ref 5–15)
BUN: 14 mg/dL (ref 6–20)
CALCIUM: 8.1 mg/dL — AB (ref 8.9–10.3)
CO2: 25 mmol/L (ref 22–32)
CREATININE: 0.75 mg/dL (ref 0.44–1.00)
Chloride: 108 mmol/L (ref 101–111)
GFR calc Af Amer: >60 mL/min (ref >60)
GLUCOSE: 115 mg/dL — AB (ref 65–99)
Potassium: 3.6 mmol/L (ref 3.5–5.1)
Sodium: 137 mmol/L (ref 135–145)

## 2016-09-24 LAB — IRON AND TIBC
Iron: 7 ug/dL — ABNORMAL LOW (ref 28–170)
Saturation Ratios: 2 % — ABNORMAL LOW (ref 10.4–31.8)
TIBC: 351 ug/dL (ref 250–450)
UIBC: 344 ug/dL

## 2016-09-24 LAB — CBC
HCT: 26.1 % — ABNORMAL LOW (ref 36.0–46.0)
Hemoglobin: 7.9 g/dL — ABNORMAL LOW (ref 12.0–15.0)
MCH: 20 pg — ABNORMAL LOW (ref 26.0–34.0)
MCHC: 30.3 g/dL (ref 30.0–36.0)
MCV: 66.1 fL — ABNORMAL LOW (ref 78.0–100.0)
PLATELETS: 211 10*3/uL (ref 150–400)
RBC: 3.95 MIL/uL (ref 3.87–5.11)
RDW: 20.7 % — AB (ref 11.5–15.5)
WBC: 8.2 10*3/uL (ref 4.0–10.5)

## 2016-09-24 LAB — TYPE AND SCREEN
ABO/RH(D): O POS
Antibody Screen: NEGATIVE

## 2016-09-24 LAB — RETICULOCYTES
RBC.: 3.92 MIL/uL (ref 3.87–5.11)
RETIC COUNT ABSOLUTE: 43.1 10*3/uL (ref 19.0–186.0)
RETIC CT PCT: 1.1 % (ref 0.4–3.1)

## 2016-09-24 LAB — VITAMIN B12: Vitamin B-12: 283 pg/mL (ref 180–914)

## 2016-09-24 LAB — FERRITIN: FERRITIN: 36 ng/mL (ref 11–307)

## 2016-09-24 LAB — FOLATE: Folate: 19.5 ng/mL (ref >5.9)

## 2016-09-24 LAB — ABO/RH: ABO/RH(D): O POS

## 2016-09-24 SURGERY — REPLACEMENT, WOUND VAC DRESSING, ABDOMEN
Anesthesia: General

## 2016-09-24 MED ORDER — PHENYLEPHRINE 40 MCG/ML (10ML) SYRINGE FOR IV PUSH (FOR BLOOD PRESSURE SUPPORT)
PREFILLED_SYRINGE | INTRAVENOUS | Status: AC
  Filled 2016-09-24: qty 40

## 2016-09-24 MED ORDER — SODIUM CHLORIDE 0.9 % IR SOLN
Status: DC | PRN
  Administered 2016-09-24: 3000 mL

## 2016-09-24 MED ORDER — LACTATED RINGERS IV SOLN
INTRAVENOUS | Status: DC
  Administered 2016-09-24: 19:00:00 via INTRAVENOUS

## 2016-09-24 MED ORDER — MEPERIDINE HCL 25 MG/ML IJ SOLN: 6.2500 mg | INTRAMUSCULAR | Status: DC | PRN

## 2016-09-24 MED ORDER — PROPOFOL 10 MG/ML IV BOLUS
INTRAVENOUS | Status: DC | PRN
Start: 2016-09-24 — End: 2016-09-24
  Administered 2016-09-24: 150 mg via INTRAVENOUS

## 2016-09-24 MED ORDER — MORPHINE SULFATE (PF) 2 MG/ML IV SOLN
2.0000 mg | INTRAVENOUS | Status: DC | PRN
  Administered 2016-09-24 – 2016-09-26 (×6): 2 mg via INTRAVENOUS
  Filled 2016-09-24 (×6): qty 1

## 2016-09-24 MED ORDER — ONDANSETRON HCL 4 MG/2ML IJ SOLN
INTRAMUSCULAR | Status: AC
  Filled 2016-09-24: qty 10

## 2016-09-24 MED ORDER — FENTANYL CITRATE (PF) 100 MCG/2ML IJ SOLN
INTRAMUSCULAR | Status: DC | PRN
  Administered 2016-09-24 (×2): 50 ug via INTRAVENOUS

## 2016-09-24 MED ORDER — LIDOCAINE 2% (20 MG/ML) 5 ML SYRINGE
INTRAMUSCULAR | Status: AC
  Filled 2016-09-24: qty 25

## 2016-09-24 MED ORDER — PROMETHAZINE HCL 25 MG/ML IJ SOLN: 6.2500 mg | INTRAMUSCULAR | Status: DC | PRN

## 2016-09-24 MED ORDER — MIDAZOLAM HCL 2 MG/2ML IJ SOLN
INTRAMUSCULAR | Status: AC
  Filled 2016-09-24: qty 2

## 2016-09-24 MED ORDER — OXYCODONE HCL 5 MG PO TABS
5.0000 mg | ORAL_TABLET | ORAL | Status: DC | PRN
  Administered 2016-09-25 – 2016-09-26 (×2): 10 mg via ORAL
  Filled 2016-09-24 (×2): qty 2

## 2016-09-24 MED ORDER — MIDAZOLAM HCL 2 MG/2ML IJ SOLN: 0.5000 mg | Freq: Once | INTRAMUSCULAR | Status: DC | PRN

## 2016-09-24 MED ORDER — LIDOCAINE HCL (CARDIAC) 20 MG/ML IV SOLN
INTRAVENOUS | Status: DC | PRN
  Administered 2016-09-24: 20 mg via INTRAVENOUS

## 2016-09-24 MED ORDER — SUCCINYLCHOLINE CHLORIDE 20 MG/ML IJ SOLN
INTRAMUSCULAR | Status: DC | PRN
  Administered 2016-09-24: 120 mg via INTRAVENOUS

## 2016-09-24 MED ORDER — SUCCINYLCHOLINE CHLORIDE 200 MG/10ML IV SOSY
PREFILLED_SYRINGE | INTRAVENOUS | Status: AC
  Filled 2016-09-24: qty 30

## 2016-09-24 MED ORDER — FENTANYL CITRATE (PF) 100 MCG/2ML IJ SOLN
INTRAMUSCULAR | Status: AC
  Filled 2016-09-24: qty 2

## 2016-09-24 MED ORDER — ONDANSETRON HCL 4 MG/2ML IJ SOLN
INTRAMUSCULAR | Status: DC | PRN
  Administered 2016-09-24: 4 mg via INTRAVENOUS

## 2016-09-24 MED ORDER — CHLORHEXIDINE GLUCONATE CLOTH 2 % EX PADS
6.0000 | MEDICATED_PAD | Freq: Once | CUTANEOUS | Status: AC
  Administered 2016-09-24: 6 via TOPICAL

## 2016-09-24 MED ORDER — PROPOFOL 10 MG/ML IV BOLUS
INTRAVENOUS | Status: AC
  Filled 2016-09-24: qty 20

## 2016-09-24 MED ORDER — EPHEDRINE 5 MG/ML INJ
INTRAVENOUS | Status: AC
  Filled 2016-09-24: qty 50

## 2016-09-24 MED ORDER — CHLORHEXIDINE GLUCONATE CLOTH 2 % EX PADS: 6.0000 | MEDICATED_PAD | Freq: Once | CUTANEOUS | Status: DC

## 2016-09-24 MED ORDER — FENTANYL CITRATE (PF) 100 MCG/2ML IJ SOLN
INTRAMUSCULAR | Status: AC
Start: 2016-09-24 — End: 2016-09-25
  Filled 2016-09-24: qty 2

## 2016-09-24 MED ORDER — NEOSTIGMINE METHYLSULFATE 5 MG/5ML IV SOSY
PREFILLED_SYRINGE | INTRAVENOUS | Status: AC
  Filled 2016-09-24: qty 5

## 2016-09-24 MED ORDER — ROCURONIUM BROMIDE 50 MG/5ML IV SOSY
PREFILLED_SYRINGE | INTRAVENOUS | Status: AC
  Filled 2016-09-24: qty 20

## 2016-09-24 MED ORDER — ONDANSETRON HCL 4 MG PO TABS
4.0000 mg | ORAL_TABLET | Freq: Once | ORAL | Status: AC
  Administered 2016-09-24: 4 mg via ORAL
  Filled 2016-09-24: qty 1

## 2016-09-24 MED ORDER — FENTANYL CITRATE (PF) 100 MCG/2ML IJ SOLN
25.0000 ug | INTRAMUSCULAR | Status: DC | PRN
  Administered 2016-09-24 (×2): 50 ug via INTRAVENOUS

## 2016-09-24 SURGICAL SUPPLY — 31 items
BNDG GAUZE ELAST 4 BULKY (GAUZE/BANDAGES/DRESSINGS) ×3 IMPLANT
CANISTER SUCT LVC 12 LTR MEDI- (MISCELLANEOUS) IMPLANT
CANISTER SUCTION 2500CC (MISCELLANEOUS) ×3 IMPLANT
CANISTER WOUND CARE 500ML ATS (WOUND CARE) IMPLANT
COVER SURGICAL LIGHT HANDLE (MISCELLANEOUS) ×3 IMPLANT
DRAPE LAPAROSCOPIC ABDOMINAL (DRAPES) ×3 IMPLANT
DRAPE UTILITY XL STRL (DRAPES) ×6 IMPLANT
DRSG PAD ABDOMINAL 8X10 ST (GAUZE/BANDAGES/DRESSINGS) ×3 IMPLANT
ELECT REM PT RETURN 9FT ADLT (ELECTROSURGICAL) ×3
ELECTRODE REM PT RTRN 9FT ADLT (ELECTROSURGICAL) ×1 IMPLANT
GLOVE BIO SURGEON STRL SZ 6 (GLOVE) ×3 IMPLANT
GLOVE BIO SURGEON STRL SZ8 (GLOVE) IMPLANT
GLOVE BIOGEL PI IND STRL 8 (GLOVE) IMPLANT
GLOVE BIOGEL PI INDICATOR 8 (GLOVE)
GOWN STRL REUS W/ TWL LRG LVL3 (GOWN DISPOSABLE) ×2 IMPLANT
GOWN STRL REUS W/ TWL XL LVL3 (GOWN DISPOSABLE) ×1 IMPLANT
GOWN STRL REUS W/TWL LRG LVL3 (GOWN DISPOSABLE) ×4
GOWN STRL REUS W/TWL XL LVL3 (GOWN DISPOSABLE) ×2
HOVERMATT SINGLE USE (MISCELLANEOUS) ×3 IMPLANT
KIT BASIN OR (CUSTOM PROCEDURE TRAY) ×3 IMPLANT
KIT ROOM TURNOVER OR (KITS) ×3 IMPLANT
NS IRRIG 1000ML POUR BTL (IV SOLUTION) ×3 IMPLANT
PACK GENERAL/GYN (CUSTOM PROCEDURE TRAY) ×3 IMPLANT
PAD ARMBOARD 7.5X6 YLW CONV (MISCELLANEOUS) ×6 IMPLANT
SPONGE ABDOMINAL VAC ABTHERA (MISCELLANEOUS) IMPLANT
SPONGE GAUZE 4X4 12PLY STER LF (GAUZE/BANDAGES/DRESSINGS) ×3 IMPLANT
SUCTION POOLE TIP (SUCTIONS) IMPLANT
TAPE CLOTH SURG 6X10 WHT LF (GAUZE/BANDAGES/DRESSINGS) ×3 IMPLANT
TOWEL OR 17X24 6PK STRL BLUE (TOWEL DISPOSABLE) ×3 IMPLANT
TOWEL OR 17X26 10 PK STRL BLUE (TOWEL DISPOSABLE) ×3 IMPLANT
WATER STERILE IRR 1000ML POUR (IV SOLUTION) IMPLANT

## 2016-09-24 NOTE — Progress Notes (Signed)
Report given to CiCi, RN and CRNA at bedside.

## 2016-09-24 NOTE — Progress Notes (Signed)
[facility]  Patient Status: Active (receiving services up to time of hospitalization)  [facility] is providing the following services: RN and PT  If patient discharges after hours, please call [phone].   Suzanne Macdonald 09/24/2016, 11:12 AM

## 2016-09-24 NOTE — Progress Notes (Signed)
Internal Medicine Attending  Date: 09/24/2016  Patient name: Suzanne Macdonald Medical record number: [id number] Date of birth: Jun 08, 1951 Age: 66 y.o. Gender: female  I saw and evaluated the patient. I reviewed the resident's note by Dr. Lovena Le and I agree with the resident's findings and plans as documented in his progress note.  Ms. Purdie had a low-grade fever overnight. She continues to have right lower quadrant pain at the area of the abscess. This does respond to the oral pain medication. Examination reveals an increase in her overlying erythema when compared to admission. She is scheduled to go to the OR today for incision and drainage. We will reassess her signs and symptoms after the surgical procedure. In the meantime, we will continue with IV antibiotic therapy.

## 2016-09-24 NOTE — Anesthesia Postprocedure Evaluation (Signed)
Anesthesia Post Note  Patient: Suzanne Macdonald  Procedure(s) Performed: Procedure(s) (LRB): Incision and Drainage Abdominal Wall Abscess (N/A)  Patient location during evaluation: PACU Anesthesia Type: General Level of consciousness: awake and alert, oriented and patient cooperative Pain management: pain level controlled Vital Signs Assessment: post-procedure vital signs reviewed and stable Respiratory status: spontaneous breathing, nonlabored ventilation, respiratory function stable and patient connected to nasal cannula oxygen Cardiovascular status: blood pressure returned to baseline and stable Postop Assessment: no signs of nausea or vomiting Anesthetic complications: no       Last Vitals:  Vitals:   09/24/16 2045 09/24/16 2055  BP:  (!) 158/67  Pulse: 64 63  Resp: 17 14  Temp:      Last Pain:  Vitals:   09/24/16 2040  TempSrc:   PainSc: 7                  [name],E. Sanjeev Main

## 2016-09-24 NOTE — Progress Notes (Signed)
Report called to short stay. No questions/complaints.

## 2016-09-24 NOTE — Progress Notes (Signed)
   Subjective: No acute events overnight. Patient continuing to have pain in her right lower abdominal quadrant where her abscess is located. This is managed with her current pain medication regimen. She will have surgery today. She had no additional questions.  Objective:  Vital signs in last 24 hours: Vitals:   09/23/16 1602 09/23/16 2036 09/23/16 2100 09/24/16 0600  BP: (!) 128/43  (!) 123/46 (!) 161/58  Pulse: 64  71   Resp: '20  19 18  '$ Temp: (!) 100.7 F (38.2 C)  98.9 F (37.2 C) 99.1 F (37.3 C)  TempSrc: Oral  Oral Oral  SpO2: 98% 98% 100% 97%  Weight:      Height:       Physical Exam  Constitutional: She appears well-developed and well-nourished.  Obese  HENT:  Head: Normocephalic and atraumatic.  Cardiovascular: Normal rate and regular rhythm.  Exam reveals no gallop and no friction rub.   No murmur heard. Respiratory: Effort normal and breath sounds normal.  GI: Soft. Bowel sounds are normal. There is tenderness.  Tenderness to palpation of the right lower quadrant. Her area of erythema which looks like cellulitis overlying her abscess has increased in surface area. It is slightly warmer on examination today.     Assessment/Plan:  Principal Problem:   Abdominal wall abscess Active Problems:   Prediabetes   Essential hypertension   History of gastric bypass   Paroxysmal atrial fibrillation (HCC)   History of colon cancer   History of cervical cancer   Asthma   Morbid obesity ([place])   Aortic atherosclerosis ([place])   In summary, patient with recurrent abdominal wall abscesses. Plan for OR today.  # Abdominal Wall Abscess  66 year old morbidly-obese woman with history of recurrent abdominal wall abscesses following large ventral hernia repair 04/2016 who presents with recurrent abscess. Seen by general surgery who will perform I&D  today.  MAXIMUM TEMPERATURE overnight of 100.7. Not tachycardic or tachypneic. No leukocytosis. Currently on ampicillin sulbactam  IV. -IV Ampicillin-Sulbactam 3g Q6 per pharmacy recs -Pain control with IV Morphine 2 mg Q4H PRN severe pain - OR today for I&D  #Paroxysmal Atrial Fibrillation  Anticoagulated with Eliquis 5 mg BID. Also on Flecainide 100 mg BID, Coreg 12.5 mg, and with Cardizem 120 mg daily. Also as PRN Cardizem 30 mg for tachycardia. Have held Eliquis in setting of upcoming I&D -Will need to ensure resumption of Eliquis postoperatively -Continued rate control as above  # Essential Hypertension The patient was hypotensive yesterday so her lisinopril was held. She is currently hypertensive. However, given that she will have anesthesia and be going to the OR today we will not resume her lisinopril until tomorrow.  - Hold daily lisinopril - Resume tomorrow status post surgery  #Asthma  -- Symbicort -- Albuterol when necessary   # Hyperlipidemia  -- Lipitor 10 mg   DVT/PE ppx: Heparin TID ppx. Will need to be held prior to surgery. Diet: HH. NPO at MN 2/4.  Code status: FULL  Dispo: Anticipated discharge pending operative course.  Ophelia Shoulder, MD 09/24/2016, 7:58 AM Pager: [phone]

## 2016-09-24 NOTE — Op Note (Signed)
Incision and Drainage/irrigation and debridement Procedure Note   Pre-operative Diagnosis: right abdominal wall abscess   Post-operative Diagnosis: same   Indications: Pt with recurrent abdominal wall abscess on right.  Prior cx enterococcus  Surgeon:  Stark Klein, MD  Anesthesia: General   Procedure Details  The procedure, risks and complications have been discussed in detail (including, infection, bleeding, need for additional procedures) with the patient, and the patient has signed consent to the procedure. The patient was informed that the wound would be left open.  The patient was taken to OR 2 and general anesthesia was induced. The patient was placed into supine position.  The skin was sterilely prepped and draped over the affected area in the usual fashion. Time out was performed according to the surgical safety checklist.   An ellipse of tissue was removed incorporating the prior incision and going medially. This was 3x4x2 cm of skin and subcutaneous fat.   The anterior surface of the abscess was unroofed and removed with the skin.   Purulent drainage was present. This was cultured.  The pulse lavage was used to irrigate the cavity. No significant bleeding was seen, but a few small sites in the dermis and fatty layer were coagulated.  The wound was packed with betadine soaked kerlix.  The patient was awakened from anesthesia and taken to the PACU in stable condition.   Findings:  Copious purulent material  EBL: minimal  Specimens:  Cultures to micro, aerobic and anaerobic.    Drains: None   Condition: Tolerated procedure well   Complications:  none known.

## 2016-09-24 NOTE — Progress Notes (Signed)
[facility] Progress Note     Subjective: No overnight events. No fever or chills, nausea or vomiting   Objective: Vital signs in last 24 hours: Temp:  [98.5 F (36.9 C)-100.7 F (38.2 C)] 99.1 F (37.3 C) (02/05 0600) Pulse Rate:  [64-71] 68 (02/05 0848) Resp:  [18-20] 18 (02/05 0600) BP: (123-161)/(43-73) 137/73 (02/05 0848) SpO2:  [97 %-100 %] 97 % (02/05 0600) Last BM Date: 09/22/16  Intake/Output from previous day: 02/04 0701 - 02/05 0700 In: 820 [P.O.:620; IV Piggyback:200] Out: 700 [Urine:700] Intake/Output this shift: No intake/output data recorded.  PE: Gen:  Alert, NAD, pleasant, cooperative, obese female lying in bed Card:  RRR, no M/G/R heard, Pulm:  Rate and effort normal Abd: Soft, obese, nondisteded, +BS, TTP around wound, no other TTP, wound on right side of abdomen with small open area, surrounding erythema and induration, no discharge noted Skin: not diaphoretic, warm and dry  Lab Results:   Recent Labs  09/23/16 0454 09/24/16 0454  WBC 9.6 8.2  HGB 8.3* 7.9*  HCT 27.9* 26.1*  PLT 194 211   BMET  Recent Labs  09/23/16 0454 09/24/16 0454  NA 137 137  K 3.5 3.6  CL 107 108  CO2 24 25  GLUCOSE 108* 115*  BUN 18 14  CREATININE 0.91 0.75  CALCIUM 8.2* 8.1*   PT/INR No results for input(s): LABPROT, INR in the last 72 hours. CMP     Component Value Date/Time   NA 137 09/24/2016 0454   NA 143 01/24/2016 0952   K 3.6 09/24/2016 0454   CL 108 09/24/2016 0454   CO2 25 09/24/2016 0454   GLUCOSE 115 (H) 09/24/2016 0454   BUN 14 09/24/2016 0454   BUN 19 01/24/2016 0952   CREATININE 0.75 09/24/2016 0454   CALCIUM 8.1 (L) 09/24/2016 0454   PROT 6.2 (L) 09/22/2016 1357   PROT 6.3 01/24/2016 0952   ALBUMIN 3.3 (L) 09/22/2016 1357   ALBUMIN 3.9 01/24/2016 0952   AST 23 09/22/2016 1357   ALT 20 09/22/2016 1357   ALKPHOS 63 09/22/2016 1357   BILITOT 0.5 09/22/2016 1357   BILITOT 0.3 01/24/2016 0952   GFRNONAA >60  09/24/2016 0454   GFRAA >60 09/24/2016 0454   Lipase     Component Value Date/Time   LIPASE 21 08/09/2016 2313       Studies/Results: Ct Abdomen Pelvis W Contrast  Result Date: 09/22/2016 CLINICAL DATA:  Recurring abdominal wall abscess EXAM: CT ABDOMEN AND PELVIS WITH CONTRAST TECHNIQUE: Multidetector CT imaging of the abdomen and pelvis was performed using the standard protocol following bolus administration of intravenous contrast. CONTRAST:  170m ISOVUE-300 IOPAMIDOL (ISOVUE-300) INJECTION 61% COMPARISON:  08/23/2016 FINDINGS: Lower chest: No acute abnormality. Hepatobiliary: No focal liver abnormality is seen. No gallstones, gallbladder wall thickening, or biliary dilatation. Pancreas: Unremarkable. No pancreatic ductal dilatation or surrounding inflammatory changes. Spleen: Normal in size without focal abnormality. Adrenals/Urinary Tract: Adrenal glands are unremarkable. Kidneys are normal, without renal calculi, focal lesion, or hydronephrosis. Bladder is unremarkable. Stomach/Bowel: Postsurgical changes are noted. No obstructive changes are seen. The appendix is within normal limits. Vascular/Lymphatic: Aortic atherosclerosis. No enlarged abdominal or pelvic lymph nodes. Reproductive: Status post hysterectomy. No adnexal masses. Other: No abdominal wall hernia is identified. There are inflammatory changes in the midline anteriorly consistent with prior postsurgical scarring. In the lateral right abdominal wall in the subcutaneous fat there is a 6.8 x 3.7 cm air-fluid collection consistent with recurrent abscess. It has reduced in size from  the prior exam. Musculoskeletal: Mild degenerative change of the lumbar spine is noted. IMPRESSION: Recurrent right lateral abdominal wall abscess as described. The remainder of the exam is stable from the prior exam. Electronically Signed   By: Inez Catalina M.D.   On: 09/22/2016 18:27    Anti-infectives: Anti-infectives    Start     Dose/Rate Route  Frequency Ordered Stop   09/23/16 0100  piperacillin-tazobactam (ZOSYN) IVPB 3.375 g  Status:  Discontinued     3.375 g 12.5 mL/hr over 240 Minutes Intravenous Every 8 hours 09/22/16 2004 09/22/16 2051   09/22/16 2200  Ampicillin-Sulbactam (UNASYN) 3 g in sodium chloride 0.9 % 100 mL IVPB     3 g 200 mL/hr over 30 Minutes Intravenous Every 6 hours 09/22/16 2052     09/22/16 1645  piperacillin-tazobactam (ZOSYN) IVPB 3.375 g     3.375 g 100 mL/hr over 30 Minutes Intravenous  Once 09/22/16 1636 09/22/16 1933       Assessment/Plan  Paroxysmal A. Fib Essential HTN Asthma Hyperlipidemia  Abdominal Wall Abscess - recurrent - maxT 100.7 overnight - no leukocytosis  ID: Unasyn 09/22/16>>  Plan: Pt will go to OR today for I&D   LOS: 2 days    Kalman Drape , [facility] Surgery 09/24/2016, 10:13 AM Pager: [phone] Consults: [phone] Mon-Fri 7:00 am-4:30 pm Sat-Sun 7:00 am-11:30 am

## 2016-09-24 NOTE — Interval H&P Note (Signed)
History and Physical Interval Note:  09/24/2016 7:14 PM  Suzanne Macdonald  has presented today for surgery, with the diagnosis of Abdominal Wall Abscess  The various methods of treatment have been discussed with the patient and family. After consideration of risks, benefits and other options for treatment, the patient has consented to  Procedure(s): Incision and Drainage Abdominal Wall Abscess (N/A) as a surgical intervention .  The patient's history has been reviewed, patient examined, no change in status, stable for surgery.  I have reviewed the patient's chart and labs.  Questions were answered to the patient's satisfaction.     [name]

## 2016-09-24 NOTE — Anesthesia Procedure Notes (Signed)
Procedure Name: Intubation Date/Time: 09/24/2016 7:41 PM Performed by: Manuela Schwartz B Pre-anesthesia Checklist: Patient identified, Emergency Drugs available, Suction available, Patient being monitored and Timeout performed Patient Re-evaluated:Patient Re-evaluated prior to inductionOxygen Delivery Method: Circle system utilized Preoxygenation: Pre-oxygenation with 100% oxygen Intubation Type: IV induction and Rapid sequence Laryngoscope Size: Mac and 3 Grade View: Grade I Tube type: Oral Tube size: 7.5 mm Airway Equipment and Method: Stylet Placement Confirmation: ETT inserted through vocal cords under direct vision,  positive ETCO2 and breath sounds checked- equal and bilateral Secured at: 21 cm Tube secured with: Tape Dental Injury: Teeth and Oropharynx as per pre-operative assessment

## 2016-09-24 NOTE — H&P (View-Only) (Signed)
[facility] Progress Note     Subjective: No overnight events. No fever or chills, nausea or vomiting   Objective: Vital signs in last 24 hours: Temp:  [98.5 F (36.9 C)-100.7 F (38.2 C)] 99.1 F (37.3 C) (02/05 0600) Pulse Rate:  [64-71] 68 (02/05 0848) Resp:  [18-20] 18 (02/05 0600) BP: (123-161)/(43-73) 137/73 (02/05 0848) SpO2:  [97 %-100 %] 97 % (02/05 0600) Last BM Date: 09/22/16  Intake/Output from previous day: 02/04 0701 - 02/05 0700 In: 820 [P.O.:620; IV Piggyback:200] Out: 700 [Urine:700] Intake/Output this shift: No intake/output data recorded.  PE: Gen:  Alert, NAD, pleasant, cooperative, obese female lying in bed Card:  RRR, no M/G/R heard, Pulm:  Rate and effort normal Abd: Soft, obese, nondisteded, +BS, TTP around wound, no other TTP, wound on right side of abdomen with small open area, surrounding erythema and induration, no discharge noted Skin: not diaphoretic, warm and dry  Lab Results:   Recent Labs  09/23/16 0454 09/24/16 0454  WBC 9.6 8.2  HGB 8.3* 7.9*  HCT 27.9* 26.1*  PLT 194 211   BMET  Recent Labs  09/23/16 0454 09/24/16 0454  NA 137 137  K 3.5 3.6  CL 107 108  CO2 24 25  GLUCOSE 108* 115*  BUN 18 14  CREATININE 0.91 0.75  CALCIUM 8.2* 8.1*   PT/INR No results for input(s): LABPROT, INR in the last 72 hours. CMP     Component Value Date/Time   NA 137 09/24/2016 0454   NA 143 01/24/2016 0952   K 3.6 09/24/2016 0454   CL 108 09/24/2016 0454   CO2 25 09/24/2016 0454   GLUCOSE 115 (H) 09/24/2016 0454   BUN 14 09/24/2016 0454   BUN 19 01/24/2016 0952   CREATININE 0.75 09/24/2016 0454   CALCIUM 8.1 (L) 09/24/2016 0454   PROT 6.2 (L) 09/22/2016 1357   PROT 6.3 01/24/2016 0952   ALBUMIN 3.3 (L) 09/22/2016 1357   ALBUMIN 3.9 01/24/2016 0952   AST 23 09/22/2016 1357   ALT 20 09/22/2016 1357   ALKPHOS 63 09/22/2016 1357   BILITOT 0.5 09/22/2016 1357   BILITOT 0.3 01/24/2016 0952   GFRNONAA >60  09/24/2016 0454   GFRAA >60 09/24/2016 0454   Lipase     Component Value Date/Time   LIPASE 21 08/09/2016 2313       Studies/Results: Ct Abdomen Pelvis W Contrast  Result Date: 09/22/2016 CLINICAL DATA:  Recurring abdominal wall abscess EXAM: CT ABDOMEN AND PELVIS WITH CONTRAST TECHNIQUE: Multidetector CT imaging of the abdomen and pelvis was performed using the standard protocol following bolus administration of intravenous contrast. CONTRAST:  174m ISOVUE-300 IOPAMIDOL (ISOVUE-300) INJECTION 61% COMPARISON:  08/23/2016 FINDINGS: Lower chest: No acute abnormality. Hepatobiliary: No focal liver abnormality is seen. No gallstones, gallbladder wall thickening, or biliary dilatation. Pancreas: Unremarkable. No pancreatic ductal dilatation or surrounding inflammatory changes. Spleen: Normal in size without focal abnormality. Adrenals/Urinary Tract: Adrenal glands are unremarkable. Kidneys are normal, without renal calculi, focal lesion, or hydronephrosis. Bladder is unremarkable. Stomach/Bowel: Postsurgical changes are noted. No obstructive changes are seen. The appendix is within normal limits. Vascular/Lymphatic: Aortic atherosclerosis. No enlarged abdominal or pelvic lymph nodes. Reproductive: Status post hysterectomy. No adnexal masses. Other: No abdominal wall hernia is identified. There are inflammatory changes in the midline anteriorly consistent with prior postsurgical scarring. In the lateral right abdominal wall in the subcutaneous fat there is a 6.8 x 3.7 cm air-fluid collection consistent with recurrent abscess. It has reduced in size from  the prior exam. Musculoskeletal: Mild degenerative change of the lumbar spine is noted. IMPRESSION: Recurrent right lateral abdominal wall abscess as described. The remainder of the exam is stable from the prior exam. Electronically Signed   By: Inez Catalina M.D.   On: 09/22/2016 18:27    Anti-infectives: Anti-infectives    Start     Dose/Rate Route  Frequency Ordered Stop   09/23/16 0100  piperacillin-tazobactam (ZOSYN) IVPB 3.375 g  Status:  Discontinued     3.375 g 12.5 mL/hr over 240 Minutes Intravenous Every 8 hours 09/22/16 2004 09/22/16 2051   09/22/16 2200  Ampicillin-Sulbactam (UNASYN) 3 g in sodium chloride 0.9 % 100 mL IVPB     3 g 200 mL/hr over 30 Minutes Intravenous Every 6 hours 09/22/16 2052     09/22/16 1645  piperacillin-tazobactam (ZOSYN) IVPB 3.375 g     3.375 g 100 mL/hr over 30 Minutes Intravenous  Once 09/22/16 1636 09/22/16 1933       Assessment/Plan  Paroxysmal A. Fib Essential HTN Asthma Hyperlipidemia  Abdominal Wall Abscess - recurrent - maxT 100.7 overnight - no leukocytosis  ID: Unasyn 09/22/16>>  Plan: Pt will go to OR today for I&D   LOS: 2 days    Kalman Drape , [facility] Surgery 09/24/2016, 10:13 AM Pager: [phone] Consults: [phone] Mon-Fri 7:00 am-4:30 pm Sat-Sun 7:00 am-11:30 am

## 2016-09-24 NOTE — Transfer of Care (Signed)
Immediate Anesthesia Transfer of Care Note  Patient: Arya Luttrull  Procedure(s) Performed: Procedure(s): Incision and Drainage Abdominal Wall Abscess (N/A)  Patient Location: PACU  Anesthesia Type:General  Level of Consciousness: awake, alert  and oriented  Airway & Oxygen Therapy: Patient Spontanous Breathing  Post-op Assessment: Report given to RN and Post -op Vital signs reviewed and stable  Post vital signs: Reviewed and stable  Last Vitals:  Vitals:   09/24/16 1240 09/24/16 1738  BP: (!) 139/58 (!) 165/66  Pulse: 66 67  Resp: 19   Temp: 37.6 C     Last Pain:  Vitals:   09/24/16 1738  TempSrc:   PainSc: 7       Patients Stated Pain Goal: 2 (35/45/62 5638)  Complications: No apparent anesthesia complications

## 2016-09-24 NOTE — Anesthesia Preprocedure Evaluation (Signed)
Anesthesia Evaluation  Patient identified by MRN, date of birth, ID band Patient awake    Reviewed: Allergy & Precautions, NPO status , Patient's Chart, lab work & pertinent test results, reviewed documented beta blocker date and time   History of Anesthesia Complications Negative for: history of anesthetic complications  Airway Mallampati: II  TM Distance: >3 FB Neck ROM: Full    Dental  (+) Edentulous Upper, Edentulous Lower   Pulmonary asthma , sleep apnea (does not need CPAP) , former smoker,    breath sounds clear to auscultation       Cardiovascular hypertension, Pt. on medications and Pt. on home beta blockers (-) angina+ dysrhythmias Atrial Fibrillation  Rhythm:Regular Rate:Normal  '17 ECHO: EF 55-60%, valves OK   Neuro/Psych negative neurological ROS     GI/Hepatic Neg liver ROS, GERD  Medicated and Controlled,H/o gastric bypass and lap band   Endo/Other  Morbid obesity  Renal/GU negative Renal ROS     Musculoskeletal  (+) Arthritis ,   Abdominal (+) + obese,   Peds  Hematology  (+) Blood dyscrasia (Hb 7,9), anemia , Eliquis: last dose 09/22/16   Anesthesia Other Findings   Reproductive/Obstetrics                             Anesthesia Physical Anesthesia Plan  ASA: III  Anesthesia Plan: General   Post-op Pain Management:    Induction: Intravenous  Airway Management Planned: Oral ETT  Additional Equipment:   Intra-op Plan:   Post-operative Plan: Extubation in OR  Informed Consent: I have reviewed the patients History and Physical, chart, labs and discussed the procedure including the risks, benefits and alternatives for the proposed anesthesia with the patient or authorized representative who has indicated his/her understanding and acceptance.     Plan Discussed with: CRNA and Surgeon  Anesthesia Plan Comments: (Plan routine monitors, GETA)        Anesthesia  Quick Evaluation

## 2016-09-25 ENCOUNTER — Encounter (HOSPITAL_COMMUNITY): Payer: Self-pay | Admitting: General Surgery

## 2016-09-25 DIAGNOSIS — Z9889 Other specified postprocedural states: Secondary | ICD-10-CM

## 2016-09-25 DIAGNOSIS — D638 Anemia in other chronic diseases classified elsewhere: Secondary | ICD-10-CM

## 2016-09-25 LAB — BASIC METABOLIC PANEL
Anion gap: 10 (ref 5–15)
BUN: 12 mg/dL (ref 6–20)
CALCIUM: 8.5 mg/dL — AB (ref 8.9–10.3)
CO2: 24 mmol/L (ref 22–32)
CREATININE: 0.72 mg/dL (ref 0.44–1.00)
Chloride: 106 mmol/L (ref 101–111)
GFR calc Af Amer: >60 mL/min (ref >60)
GFR calc non Af Amer: >60 mL/min (ref >60)
GLUCOSE: 88 mg/dL (ref 65–99)
Potassium: 3.6 mmol/L (ref 3.5–5.1)
Sodium: 140 mmol/L (ref 135–145)

## 2016-09-25 LAB — CBC
HEMATOCRIT: 26 % — AB (ref 36.0–46.0)
Hemoglobin: 7.8 g/dL — ABNORMAL LOW (ref 12.0–15.0)
MCH: 20.1 pg — ABNORMAL LOW (ref 26.0–34.0)
MCHC: 30 g/dL (ref 30.0–36.0)
MCV: 67 fL — AB (ref 78.0–100.0)
Platelets: 193 10*3/uL (ref 150–400)
RBC: 3.88 MIL/uL (ref 3.87–5.11)
RDW: 21.2 % — AB (ref 11.5–15.5)
WBC: 6.1 10*3/uL (ref 4.0–10.5)

## 2016-09-25 MED ORDER — LISINOPRIL 5 MG PO TABS
5.0000 mg | ORAL_TABLET | Freq: Every day | ORAL | Status: DC
  Administered 2016-09-25 – 2016-09-26 (×2): 5 mg via ORAL
  Filled 2016-09-25 (×2): qty 1

## 2016-09-25 MED ORDER — APIXABAN 5 MG PO TABS
5.0000 mg | ORAL_TABLET | Freq: Two times a day (BID) | ORAL | Status: DC
  Administered 2016-09-25 – 2016-09-26 (×2): 5 mg via ORAL
  Filled 2016-09-25 (×2): qty 1

## 2016-09-25 MED ORDER — DOCUSATE SODIUM 100 MG PO CAPS
100.0000 mg | ORAL_CAPSULE | Freq: Every day | ORAL | Status: DC | PRN
  Administered 2016-09-25: 100 mg via ORAL
  Filled 2016-09-25: qty 1

## 2016-09-25 NOTE — Care Management Note (Signed)
Case Management Note  Patient Details  Name: Suzanne Macdonald MRN: [id number] Date of Birth: 1951-05-29  Subjective/Objective:                    Action/Plan:  Prior to admission patient had [facility] for home health RN and PT . If these services are to continue will need new orders with wound care instructions and face to face . PA aware. Wound care will be determined tomorrow , she will enter orders at that time.   Patient wants to continue with [facility] and HHPT Volanda Napoleon. Patient's husband willing to be shown wound care. Expected Discharge Date:                  Expected Discharge Plan:  [place]  In-House Referral:     Discharge planning Services     Post Acute Care Choice:    Choice offered to:     DME Arranged:    DME Agency:     HH Arranged:   HHRN,PT HH Agency:   [facility]  Status of Service:  In process, will continue to follow  If discussed at Long Length of Stay Meetings, dates discussed:    Additional Comments:  Marilu Favre, RN 09/25/2016, 1:51 PM

## 2016-09-25 NOTE — Progress Notes (Signed)
Subjective: No acute events overnight. Patient stating that she feels well this morning. Only moderate pain over her surgical site. She was afebrile overnight. She was eating well. She is passing gas. She denies nausea or vomiting. She is covering appropriately postoperatively. She is hypertensive today and we'll restart her medications as detailed in the assessment and plan below.  Objective:  Vital signs in last 24 hours: Vitals:   09/24/16 2112 09/25/16 0226 09/25/16 0644 09/25/16 1026  BP: (!) 151/47 (!) 151/57 (!) 156/63 132/90  Pulse: 62 61 62 60  Resp: '18 18 18 17  '$ Temp: 99.1 F (37.3 C) 98.5 F (36.9 C) 97.8 F (36.6 C) 98.5 F (36.9 C)  TempSrc:  Oral Oral Oral  SpO2: 94% 96% 97% 98%  Weight:      Height:       Physical Exam  Constitutional: She appears well-developed and well-nourished.  Obese  HENT:  Head: Normocephalic and atraumatic.  Cardiovascular: Normal rate and regular rhythm.  Exam reveals no gallop and no friction rub.   No murmur heard. Respiratory: Effort normal and breath sounds normal.  GI: Soft. Bowel sounds are normal. There is no tenderness.  Abdomen less tender to palpation. Surgical site covered in dressing. Only minimal serosanguineous drainage appreciated on dressing. Area of erythema not extending beyond dressing.     Assessment/Plan:  Principal Problem:   Abdominal wall abscess Active Problems:   Prediabetes   Essential hypertension   History of gastric bypass   Paroxysmal atrial fibrillation (HCC)   History of colon cancer   History of cervical cancer   Asthma   Morbid obesity ([place])   Aortic atherosclerosis ([place])   In summary, patient postop day 1 from I & D of abdominal wall abscess. She is afebrile and hemodynamically stable. No leukocytosis. Pain well-controlled. The patient has been hypertensive over the previous 36 hours. We have been holding her lisinopril. We will start this medication back today. Additionally we will await  for culture results from her wound to further guide antimicrobial therapy. Also, patient has a history of paroxysmal atrial fibrillation we'll restart her Eliquis this evening after it has been approximately 24 hours since her surgery. Lastly, the patient has a chronic anemia and labs are consistent with  anemia of chronic disease. We plan to monitor today and prepare for discharge tomorrow.  # Abdominal Wall Abscess Postop day 1 status post I&D. Afebrile and hemodynamically stable. No leukocytosis. Pain well-controlled. We'll continue with antimicrobial therapy and wait for culture data. We'll plan for discharge tomorrow. - Ampicillin sulbactam - Morphine when necessary pain - Follow up cultures  #Paroxysmal Atrial Fibrillation  Anticoagulated with Eliquis 5 mg BID. Also on Flecainide 100 mg BID, Coreg 12.5 mg, and with Cardizem 120 mg daily. Also as PRN Cardizem 30 mg for tachycardia. Eliquis willbe restarted at 2000 this evening -Continued rate control as above  # Essential Hypertension Currently hypertensive. Status post I&D. We'll resume lisinopril today. -- Lisinopril 5 mg daily  # Chronic anemia most likely anemia of chronic disease She has documented chronic anemia. Hemoglobin today is 7.8. MCV  67. Red cell distribution width 21.2.Although the measure of dispersion for her Red cell distribution width is elevated given that her red blood cell population is microcytic this most likely represents a single morphology of cells and does not require microscopic examination. Given the high degree of variance the likely mean value truly falls in this range. B12 and folate normal ruling out megaloblastic anemia. Iron  diminished 7 and saturation decreased to however total iron binding capacity normal. Ferritin also normal. Given that her iron is low and her ferritin is normal this profile is most consistent with anemia of chronic disease and would fit clinically with her ongoing recurrent  abscess. Should improve with infection control.  #Asthma  -- Symbicort -- Albuterol when necessary   # Hyperlipidemia  -- Lipitor 10 mg   DVT/PE ppx: Currently SCDs, will restart all her questions this evening Diet: Normal Code status: FULL  Dispo: Anticipated discharge tomorrow.  Ophelia Shoulder, MD 09/25/2016, 10:27 AM Pager: [phone]

## 2016-09-25 NOTE — Progress Notes (Signed)
Internal Medicine Attending  Date: 09/25/2016  Patient name: Suzanne Macdonald Medical record number: [id number] Date of birth: 11-06-1950 Age: 66 y.o. Gender: female  I saw and evaluated the patient. I reviewed the resident's note by Dr. Lovena Le and I agree with the resident's findings and plans as documented in his progress note.  When seen on rounds this morning Ms. Mccollom felt better with decreased pain in the abdomen postoperatively. The culture shows gram-positive and gram negative coccobacilli, but identification and sensitivities are pending at this time. We will continue the IV Unasyn overnight and if she continues to do well likely convert her to oral antibiotics tomorrow in anticipation of discharge.

## 2016-09-25 NOTE — Progress Notes (Signed)
[facility] Progress Note  1 Day Post-Op  Subjective: Pt with no complaints. No events overnight. Minimal pain.  Objective: Vital signs in last 24 hours: Temp:  [97.8 F (36.6 C)-99.7 F (37.6 C)] 98.5 F (36.9 C) (02/06 1026) Pulse Rate:  [60-69] 60 (02/06 1026) Resp:  [12-21] 17 (02/06 1026) BP: (116-165)/(47-90) 132/90 (02/06 1026) SpO2:  [93 %-99 %] 98 % (02/06 1026) Last BM Date: 09/23/16  Intake/Output from previous day: 02/05 0701 - 02/06 0700 In: 700 [I.V.:700] Out: 1460 [Urine:1450; Blood:10] Intake/Output this shift: Total I/O In: 240 [P.O.:240] Out: 350 [Urine:350]  PE: Gen:  Alert, NAD, pleasant, cooperative, sitting up in the chair, well appearing Card:  RRR, no M/G/R heard Pulm:  CTA, no W/R/R, effort normal Abd: Soft, nondistended, +BS, mild TTP over wound but no other abdominal tenderness, wound C/D/I Skin: not diaphoretic, warm and dry  Lab Results:   Recent Labs  09/24/16 0454 09/25/16 0813  WBC 8.2 6.1  HGB 7.9* 7.8*  HCT 26.1* 26.0*  PLT 211 193   BMET  Recent Labs  09/24/16 0454 09/25/16 0813  NA 137 140  K 3.6 3.6  CL 108 106  CO2 25 24  GLUCOSE 115* 88  BUN 14 12  CREATININE 0.75 0.72  CALCIUM 8.1* 8.5*   PT/INR No results for input(s): LABPROT, INR in the last 72 hours. CMP     Component Value Date/Time   NA 140 09/25/2016 0813   NA 143 01/24/2016 0952   K 3.6 09/25/2016 0813   CL 106 09/25/2016 0813   CO2 24 09/25/2016 0813   GLUCOSE 88 09/25/2016 0813   BUN 12 09/25/2016 0813   BUN 19 01/24/2016 0952   CREATININE 0.72 09/25/2016 0813   CALCIUM 8.5 (L) 09/25/2016 0813   PROT 6.2 (L) 09/22/2016 1357   PROT 6.3 01/24/2016 0952   ALBUMIN 3.3 (L) 09/22/2016 1357   ALBUMIN 3.9 01/24/2016 0952   AST 23 09/22/2016 1357   ALT 20 09/22/2016 1357   ALKPHOS 63 09/22/2016 1357   BILITOT 0.5 09/22/2016 1357   BILITOT 0.3 01/24/2016 0952   GFRNONAA >60 09/25/2016 0813   GFRAA >60 09/25/2016 0813   Lipase      Component Value Date/Time   LIPASE 21 08/09/2016 2313       Studies/Results: No results found.  Anti-infectives: Anti-infectives    Start     Dose/Rate Route Frequency Ordered Stop   09/23/16 0100  piperacillin-tazobactam (ZOSYN) IVPB 3.375 g  Status:  Discontinued     3.375 g 12.5 mL/hr over 240 Minutes Intravenous Every 8 hours 09/22/16 2004 09/22/16 2051   09/22/16 2200  Ampicillin-Sulbactam (UNASYN) 3 g in sodium chloride 0.9 % 100 mL IVPB     3 g 200 mL/hr over 30 Minutes Intravenous Every 6 hours 09/22/16 2052     09/22/16 1645  piperacillin-tazobactam (ZOSYN) IVPB 3.375 g     3.375 g 100 mL/hr over 30 Minutes Intravenous  Once 09/22/16 1636 09/22/16 1933       Assessment/Plan  Paroxysmal A. Fib Essential HTN Asthma Hyperlipidemia  Abdominal Wall Abscess S/P I&D, Dr. Barry Dienes, 09/24/16 - recurrent - maxT 99.7 overnight - no leukocytosis  ID: Unasyn 09/22/16>>  Plan: dressing change tomorrow, continue Unasyn today. Culture pending. Can be discharged on PO antibiotics likely tomorrow.    LOS: 3 days    Kalman Drape , [facility] Surgery 09/25/2016, 11:19 AM Pager: [phone] Consults: [phone] Mon-Fri 7:00 am-4:30 pm Sat-Sun 7:00 am-11:30 am

## 2016-09-26 MED ORDER — HEPARIN SOD (PORK) LOCK FLUSH 100 UNIT/ML IV SOLN
500.0000 [IU] | INTRAVENOUS | Status: AC | PRN
  Administered 2016-09-26: 500 [IU]

## 2016-09-26 MED ORDER — AMOXICILLIN-POT CLAVULANATE 875-125 MG PO TABS
1.0000 | ORAL_TABLET | Freq: Two times a day (BID) | ORAL | Status: DC
  Administered 2016-09-26: 1 via ORAL
  Filled 2016-09-26: qty 1

## 2016-09-26 MED ORDER — HYDROCODONE-ACETAMINOPHEN 5-325 MG PO TABS: 1.0000 | ORAL_TABLET | Freq: Four times a day (QID) | ORAL | 0 refills | Status: DC | PRN

## 2016-09-26 MED ORDER — FERROUS SULFATE 325 (65 FE) MG PO TABS: 325.0000 mg | ORAL_TABLET | Freq: Every day | ORAL | 0 refills | Status: DC

## 2016-09-26 MED ORDER — OXYCODONE-ACETAMINOPHEN 7.5-325 MG PO TABS: 1.0000 | ORAL_TABLET | Freq: Four times a day (QID) | ORAL | 0 refills | Status: DC | PRN

## 2016-09-26 MED ORDER — AMOXICILLIN-POT CLAVULANATE 875-125 MG PO TABS: 1.0000 | ORAL_TABLET | Freq: Two times a day (BID) | ORAL | 0 refills | Status: AC

## 2016-09-26 NOTE — Care Management Note (Signed)
Case Management Note  Patient Details  Name: Alethea Terhaar MRN: [id number] Date of Birth: [birth year]  Subjective/Objective:                    Action/Plan:   Expected Discharge Date:  09/26/16               Expected Discharge Plan:  [place]  In-House Referral:     Discharge planning Services  CM Consult  Post Acute Care Choice:  Home Health Choice offered to:  Patient  DME Arranged:    DME Agency:     HH Arranged:  RN, PT [place] Agency:  [place]  Status of Service:  Completed, signed off  If discussed at [place] of Stay Meetings, dates discussed:    Additional Comments:  Marilu Favre, RN 09/26/2016, 11:50 AM

## 2016-09-26 NOTE — Progress Notes (Signed)
Suzanne Macdonald to be D/C'd  per MD order. Discussed with the patient and all questions fully answered.  VSS, Skin clean, dry and intact without evidence of skin break down, no evidence of skin tears noted.   An After Visit Summary was printed and given to the patient. Patient received prescription.  D/c education completed with patient/family including follow up instructions, medication list, d/c activities limitations if indicated, with other d/c instructions as indicated by MD - patient able to verbalize understanding, all questions fully answered.   Patient instructed to return to ED, call 911, or call MD for any changes in condition.   Patient to be escorted via [place], and D/C home via private auto.

## 2016-09-26 NOTE — Discharge Summary (Signed)
Name: Suzanne Macdonald MRN: [id number] DOB: 1951/07/10 66 y.o. PCP: Bartholomew Crews, MD  Date of Admission: 09/22/2016  1:24 PM Date of Discharge: 09/26/2016 Attending Physician: Oval Linsey, MD  Discharge Diagnosis: 1. Recurrent right abdominal wall abscess. 2. Paroxysmal atrial fibrillation 3. Essential hypertension 4. Chronic anemia of chronic disease Principal Problem:   Abdominal wall abscess Active Problems:   Prediabetes   Essential hypertension   History of gastric bypass   Paroxysmal atrial fibrillation (HCC)   History of colon cancer   History of cervical cancer   Asthma   Morbid obesity ([place])   Aortic atherosclerosis ([place])   Discharge Medications: Allergies as of 09/26/2016      Reactions   Augmentin [amoxicillin-pot Clavulanate] Diarrhea   Scallops [shellfish Allergy] Hives   Vancomycin Other (See Comments)   Pt began coughing and c/o tightening of chest/difficulty breathing and flushing during Vanc infusion.  Reports she never wants to have Vancomycin again even if it can be prevented with rate reduction   Barium-containing Compounds Other (See Comments)   Feels like cement in the body      Medication List    STOP taking these medications   acetaminophen-codeine 300-30 MG tablet Commonly known as:  TYLENOL #3   HYDROcodone-acetaminophen 5-325 MG tablet Commonly known as:  NORCO/VICODIN   oxyCODONE 5 MG immediate release tablet Commonly known as:  Oxy IR/ROXICODONE   prochlorperazine 10 MG tablet Commonly known as:  COMPAZINE     TAKE these medications   acetaminophen 500 MG tablet Commonly known as:  TYLENOL Take 1,000 mg by mouth every 6 (six) hours as needed for mild pain or moderate pain.   albuterol 108 (90 Base) MCG/ACT inhaler Commonly known as:  PROVENTIL HFA;VENTOLIN HFA Inhale 1-2 puffs into the lungs every 6 (six) hours as needed for wheezing or shortness of breath.   ALIVE WOMENS GUMMY Chew Chew 2 each by mouth daily.     amoxicillin-clavulanate 875-125 MG tablet Commonly known as:  AUGMENTIN Take 1 tablet by mouth every 12 (twelve) hours. What changed:  when to take this  additional instructions   apixaban 5 MG Tabs tablet Commonly known as:  ELIQUIS Take 1 tablet (5 mg total) by mouth 2 (two) times daily.   atorvastatin 10 MG tablet Commonly known as:  LIPITOR take 1 tablet by mouth once daily   budesonide-formoterol 160-4.5 MCG/ACT inhaler Commonly known as:  SYMBICORT Inhale 2 puffs into the lungs 2 (two) times daily. What changed:  when to take this  reasons to take this   CALCIUM/VITAMIN D3/ADULT GUMMY PO Take 2 each by mouth daily.   carvedilol 12.5 MG tablet Commonly known as:  COREG Take 1 tablet (12.5 mg total) by mouth 2 (two) times daily with a meal.   cyanocobalamin 500 MCG tablet Take 1,000 mcg by mouth daily.   diltiazem 120 MG 24 hr capsule Commonly known as:  CARDIZEM CD Take 1 capsule (120 mg total) by mouth daily.   diltiazem 30 MG tablet Commonly known as:  CARDIZEM Take 1 tablet every 4 hours AS NEEDED for AFIB fast heart rate >100 as long as blood pressure >100.   docusate sodium 100 MG capsule Commonly known as:  COLACE Take 1 capsule (100 mg total) by mouth 2 (two) times daily as needed for mild constipation.   ferrous sulfate 325 (65 FE) MG tablet Take 1 tablet (325 mg total) by mouth daily with breakfast.   flecainide 100 MG tablet Commonly known as:  TAMBOCOR take 1 tablet by mouth twice a day   lisinopril 5 MG tablet Commonly known as:  PRINIVIL,ZESTRIL take 1 tablet by mouth once daily   loperamide 2 MG capsule Commonly known as:  IMODIUM Take 2 mg by mouth daily as needed for diarrhea or loose stools.   omeprazole 40 MG capsule Commonly known as:  PRILOSEC Take 1 capsule (40 mg total) by mouth daily.   oxyCODONE-acetaminophen 7.5-325 MG tablet Commonly known as:  PERCOCET Take 1 tablet by mouth every 6 (six) hours as needed for severe  pain.   potassium chloride 10 MEQ tablet Commonly known as:  K-DUR 2 TABLETS BY MOUTH DAILY What changed:  how much to take  how to take this  when to take this  additional instructions   traZODone 100 MG tablet Commonly known as:  DESYREL Take 100 mg by mouth at bedtime.   Vitamin D3 2000 units Chew Chew 2,000 Units by mouth 2 (two) times daily.       Disposition and follow-up:   [name] was discharged from [facility] in Good condition.  At the hospital follow up visit please address:  1.  Please ensure the patient has finished her antibiotics.  2.  Labs / imaging needed at time of follow-up: CBC  3.  Pending labs/ test needing follow-up: Wound culture and sensitivities  Follow-up Appointments: Follow-up Information    BYERLY,FAERA, MD. Schedule an appointment as soon as possible for a visit in 2 week(s).   Specialty:  General Surgery Contact information: [place] [address] [phone]           Hospital Course by problem list: Principal Problem:   Abdominal wall abscess Active Problems:   Prediabetes   Essential hypertension   History of gastric bypass   Paroxysmal atrial fibrillation (HCC)   History of colon cancer   History of cervical cancer   Asthma   Morbid obesity ([place])   Aortic atherosclerosis ([place])   1. Recurrent right abdominal wall abscess. The patient presented to the [facility] emergency department on 09/22/2016 with recurrent right abdominal pain and tenderness. Importantly, the patient has had multiple recurrent abdominal wall abscesses following an urgent repair of an incarcerated hernia in September. At the time of admission the patient was afebrile and hemodynamically stable. CT scan showed recurrent right lateral abdominal wall abscess. The patient was started on ampicillin sulbactam for broad-spectrum antibiotic coverage. This was chosen based on previous culture data and  sensitivities from prior hospitalizations. She was then taken to the operating room by surgery where an incision and drainage was done. She remained on intravenous antimicrobial therapy during the course of her hospitalization. Following surgery she recovered appropriately. Her pain is well-controlled. She was tolerating good by mouth intake. She remained afebrile without leukocytosis. She will be discharged on a 10 day course of Augmentin. On the day of discharge her culture data and sensitivities had not completed. I discussed with the patient that if an antibiotic change needed to occur we will contact her with this information. She will have follow-up with general surgery.  2. Paroxysmal atrial fibrillation The patient has a history of paroxysmal atrial fibrillation. During her hospitalization she remained in sinus rhythm. Her anticoagulation was held 24 hours prior to surgery and was resumed 24 hours status post I&D. The remainder of her cardiac medications were unchanged. She'll be discharged on her normal regimen to treat this condition.  3. Essential hypertension The  patient has a history of essential hypertension which seems to be adequately controlled in the outpatient setting. During her admission she had 1 day of relative hypotension and her lisinopril was held. Following her I&D the patient's blood pressure became borderline hypertensive with systolic greater than 846. Her lisinopril was restarted with appropriate response. She'll be discharged on all of her home antihypertensive medications. We have not made changes to this medication regimen.  4. Chronic anemia of chronic disease Patient has a history of chronic anemia. CBC: The hospital showed a hemoglobin ranging from 9.8-7.8. Anemia panel showed normal folate and B12. Her iron was diminished but her ferritin was normal. These labs are consistent with anemia of chronic disease. I suspect her anemia will improve as her infection and  recurrent abscesses are controlled. She will benefit from ongoing iron supplementation in the outpatient setting. She will also benefit from repeat CBC to ensure her hemoglobin has remained stable. Discharge Vitals:   BP (!) 142/58 (BP Location: Left Arm)   Pulse (!) 58   Temp 97.8 F (36.6 C) (Oral)   Resp 18   Ht 5' (1.524 m)   Wt 274 lb (124.3 kg)   SpO2 96%   BMI 53.51 kg/m   Pertinent Labs, Studies, and Procedures:  1. CT abdomen and pelvis-recurrent right lower abdominal wall abscess. 2. Incision and drainage of right abdominal wall abscess  Discharge Instructions: Discharge Instructions    Diet - low sodium heart healthy    Complete by:  As directed    Discharge instructions    Complete by:  As directed    It has been a pleasure taking care of you in the hospital. I hope this time we will not see you back for another infection.  I am going to prescribe an antibiotic called Augmentin. You have taken this in the past. You're to take 1 pill twice daily for 10 days. If the bacteria growing from your surgery needs a different antibiotic we will contact you and discuss this with you on the phone. Also, in the case that happens we will send the antibiotic to the appropriate pharmacy. Please ensure you finish all of the antibiotic as prescribed.  If an appointment from your surgeon has not been made. Please call them and schedule an appointment to be seen in the next 7-10 days. If an appointment has artery been arranged by their practice please ensure you make this appointment.  I also recommend that you follow-up with your primary care provider in the next week to discuss any issues he or she may have.  Lastly, good luck with your psychology studies.   Increase activity slowly    Complete by:  As directed       Signed: Ophelia Shoulder, MD 09/26/2016, 12:37 PM   Pager: [phone]

## 2016-09-26 NOTE — Care Management Important Message (Signed)
Important Message  Patient Details  Name: Suzanne Macdonald MRN: [id number] Date of Birth: 01-18-1951   Medicare Important Message Given:  Yes    [name] 09/26/2016, 10:05 AM

## 2016-09-26 NOTE — Progress Notes (Signed)
Internal Medicine Attending  Date: 09/26/2016  Patient name: Suzanne Macdonald Medical record number: [id number] Date of birth: 1951/02/20 Age: 66 y.o. Gender: female  I saw and evaluated the patient. I reviewed the resident's note by Dr. Lovena Le and I agree with the resident's findings and plans as documented in his progress note.  When seen on rounds this morning she felt much better with minimal surgical incisional pain. She was without any complaints otherwise and was looking forward to discharge home. Follow-up will be with her primary care provider and with general surgery.

## 2016-09-26 NOTE — Progress Notes (Signed)
   Subjective: No acute events overnight.Patient feeling well this morning. She has no complaints. She is looking forward to going home.   Objective:  Vital signs in last 24 hours: Vitals:   09/25/16 1405 09/25/16 2007 09/25/16 2043 09/26/16 0603  BP: 130/90  (!) 141/47 (!) 142/58  Pulse: 62  77 (!) 58  Resp: '17  17 18  '$ Temp: 98 F (36.7 C)  98.2 F (36.8 C) 97.8 F (36.6 C)  TempSrc: Oral  Oral Oral  SpO2: 98% 98% 96% 97%  Weight:      Height:       Physical Exam  Constitutional: She appears well-developed and well-nourished.  Obese  HENT:  Head: Normocephalic and atraumatic.  Cardiovascular: Normal rate and regular rhythm.  Exam reveals no gallop and no friction rub.   No murmur heard. Respiratory: Effort normal and breath sounds normal.  GI: Soft. Bowel sounds are normal. There is no tenderness.  Abdomen less tender to palpation. Surgical site covered in dressing. No drainage appreciated this am     Assessment/Plan:  Principal Problem:   Abdominal wall abscess Active Problems:   Prediabetes   Essential hypertension   History of gastric bypass   Paroxysmal atrial fibrillation (HCC)   History of colon cancer   History of cervical cancer   Asthma   Morbid obesity ([place])   Aortic atherosclerosis ([place])   In summary, patient postop day 2 from I & D of abdominal wall abscess. She is afebrile and hemodynamically stable. No leukocytosis. Pain well-controlled. Culture showing mix of gram positive coccobacillus and gram negative coccobacilli. Will wait further speciation and culture data. Will transition to PO Augmentin today in preparation for discharge this afternoon.  # Abdominal Wall Abscess Postop day 2 status post I&D. Afebrile and hemodynamically stable. No leukocytosis. Pain well-controlled. We'll continue with antimicrobial therapy and wait for culture data. . - Ampicillin sulbactam-- transition to PO Augmentin  - Morphine when necessary pain - Follow up  cultures  #Paroxysmal Atrial Fibrillation  Anticoagulated with Eliquis 5 mg BID. Also on Flecainide 100 mg BID, Coreg 12.5 mg, and with Cardizem 120 mg daily. Also as PRN Cardizem 30 mg for tachycardia. -Continued rate control as above -- Apixiban '5mg'$  BID started last night.  # Essential Hypertension Currently hypertensive. HTN improved after restarting lisinopril.  -- Lisinopril 5 mg daily  # Chronic anemia most likely anemia of chronic disease She has documented chronic anemia. Hemoglobin 2/6 is 7.8. MCV  67. Red cell distribution width 21.2.Although the measure of dispersion for her Red cell distribution width is elevated given that her red blood cell population is microcytic this most likely represents a single morphology of cells and does not require microscopic examination. Given the high degree of variance the likely mean value truly falls in this range. B12 and folate normal ruling out megaloblastic anemia. Iron diminished 7 and saturation decreased  however total iron binding capacity normal. Ferritin also normal. Given that her iron is low and her ferritin is normal this profile is most consistent with anemia of chronic disease and would fit clinically with her ongoing recurrent abscess. Should improve with infection control.  #Asthma  -- Symbicort -- Albuterol when necessary   # Hyperlipidemia  -- Lipitor 10 mg   DVT/PE ppx: On full anticoagulation with apixiban  Diet: Normal Code status: FULL  Dispo: Anticipated discharge today.  Ophelia Shoulder, MD 09/26/2016, 7:21 AM Pager: [phone]

## 2016-09-26 NOTE — Discharge Instructions (Addendum)
Information on my medicine - ELIQUIS (apixaban)  This medication education was reviewed with me or my healthcare representative as part of my discharge preparation.    Why was Eliquis prescribed for you? Eliquis was prescribed for you to reduce the risk of a blood clot forming that can cause a stroke if you have a medical condition called atrial fibrillation (a type of irregular heartbeat).  What do You need to know about Eliquis ? Take your Eliquis TWICE DAILY - one tablet in the morning and one tablet in the evening with or without food. If you have difficulty swallowing the tablet whole please discuss with your pharmacist how to take the medication safely.  Take Eliquis exactly as prescribed by your doctor and DO NOT stop taking Eliquis without talking to the doctor who prescribed the medication.  Stopping may increase your risk of developing a stroke.  Refill your prescription before you run out.  After discharge, you should have regular check-up appointments with your healthcare provider that is prescribing your Eliquis.  In the future your dose may need to be changed if your kidney function or weight changes by a significant amount or as you get older.  What do you do if you miss a dose? If you miss a dose, take it as soon as you remember on the same day and resume taking twice daily.  Do not take more than one dose of ELIQUIS at the same time to make up a missed dose.  Important Safety Information A possible side effect of Eliquis is bleeding. You should call your healthcare provider right away if you experience any of the following: ? Bleeding from an injury or your nose that does not stop. ? Unusual colored urine (red or dark brown) or unusual colored stools (red or black). ? Unusual bruising for unknown reasons. ? A serious fall or if you hit your head (even if there is no bleeding).  Some medicines may interact with Eliquis and might increase your risk of bleeding or  clotting while on Eliquis. To help avoid this, consult your healthcare provider or pharmacist prior to using any new prescription or non-prescription medications, including herbals, vitamins, non-steroidal anti-inflammatory drugs (NSAIDs) and supplements.  This website has more information on Eliquis (apixaban): [email or web address] [facility], [place] [phone]  POST OP INSTRUCTIONS  Always review your discharge instruction sheet given to you by the facility where your surgery was performed.  IF YOU HAVE DISABILITY OR FAMILY LEAVE FORMS, YOU MUST BRING THEM TO THE OFFICE FOR PROCESSING.  PLEASE DO NOT GIVE THEM TO YOUR DOCTOR.  MIDLINE WOUND CARE: - midline dressing to be changed twice daily - supplies: sterile saline, kerlix, scissors, ABD pads, tape  - remove dressing and all packing carefully, moistening with sterile saline as needed to avoid packing/internal dressing sticking to the wound. - clean edges of skin around the wound with water/gauze, making sure there is no tape debris or leakage left on skin that could cause skin irritation or breakdown. - dampen and clean kerlix with sterile saline and pack wound from wound base to skin level, making sure to take note of any possible areas of wound tracking, tunneling and packing appropriately. Wound can be packed loosely. Trim kerlix to size if a whole kerlix is not required. - cover wound with a dry ABD pad and secure with tape.  - write the date/time on the dry dressing/tape to better track when the last dressing change occurred. -  apply any skin protectant/powder recommended by clinician to protect skin/skin folds. - change dressing as needed if leakage occurs, wound gets contaminated, or patient requests to shower. - patient may shower daily with wound open and following the shower the wound should be dried and a clean dressing placed.  CHANGE PACKING TWICE DAILY and the outer dressing as needed to  keep skin clean and dry  1. A prescription for pain medication may be given to you upon discharge.  Take your pain medication as prescribed, if needed.  If narcotic pain medicine is not needed, then you may take acetaminophen (Tylenol) or ibuprofen (Advil) as needed. 2. Take your usually prescribed medications unless otherwise directed. 3. If you need a refill on your pain medication, please contact your pharmacy. They will contact our office to request authorization.  Prescriptions will not be filled after 5pm or on week-ends. 4. Most patients will experience some swelling and bruising in the area of the incision. Ice pack will help. Swelling and bruising can take several days to resolve..  5. It is common to experience some constipation if taking pain medication after surgery.  Increasing fluid intake and taking a stool softener will usually help or prevent this problem from occurring.  A mild laxative (Milk of Magnesia or Miralax) should be taken according to package directions if there are no bowel movements after 48 hours. 6.  ACTIVITIES:  You may resume regular (light) daily activities beginning the next day--such as daily self-care, walking, climbing stairs--gradually increasing activities as tolerated.  You may have sexual intercourse when it is comfortable.  Refrain from any heavy lifting or straining until approved by your doctor. a. You may drive when you no longer are taking prescription pain medication, you can comfortably wear a seatbelt, and you can safely maneuver your car and apply brakes 7. You should see your doctor in the office for a follow-up appointment approximately two weeks after your surgery.  Make sure that you call for this appointment within a day or two after you arrive home to insure a convenient appointment time.  WHEN TO CALL YOUR DOCTOR: 1. Fever over 101.0 2. Inability to urinate 3. Nausea and/or vomiting 4. Extreme swelling or bruising 5. Continued bleeding from  incision. 6. Increased pain, redness, or drainage from the incision. 7. Difficulty swallowing or breathing 8. Muscle cramping or spasms. 9. Numbness or tingling in hands or feet or around lips.  The clinic staff is available to answer your questions during regular business hours.  Please dont hesitate to call and ask to speak to one of the nurses if you have concerns.  For further questions, please visit [email or web address]

## 2016-09-27 DIAGNOSIS — L02211 Cutaneous abscess of abdominal wall: Secondary | ICD-10-CM | POA: Diagnosis not present

## 2016-09-27 DIAGNOSIS — T814XXD Infection following a procedure, subsequent encounter: Secondary | ICD-10-CM | POA: Diagnosis not present

## 2016-09-27 DIAGNOSIS — I10 Essential (primary) hypertension: Secondary | ICD-10-CM | POA: Diagnosis not present

## 2016-09-27 DIAGNOSIS — B9620 Unspecified Escherichia coli [E. coli] as the cause of diseases classified elsewhere: Secondary | ICD-10-CM | POA: Diagnosis not present

## 2016-09-27 DIAGNOSIS — I480 Paroxysmal atrial fibrillation: Secondary | ICD-10-CM | POA: Diagnosis not present

## 2016-09-27 DIAGNOSIS — L03311 Cellulitis of abdominal wall: Secondary | ICD-10-CM | POA: Diagnosis not present

## 2016-09-28 ENCOUNTER — Other Ambulatory Visit: Payer: Self-pay | Admitting: Pharmacist

## 2016-09-28 DIAGNOSIS — I480 Paroxysmal atrial fibrillation: Secondary | ICD-10-CM | POA: Diagnosis not present

## 2016-09-28 DIAGNOSIS — B9620 Unspecified Escherichia coli [E. coli] as the cause of diseases classified elsewhere: Secondary | ICD-10-CM | POA: Diagnosis not present

## 2016-09-28 DIAGNOSIS — T814XXD Infection following a procedure, subsequent encounter: Secondary | ICD-10-CM | POA: Diagnosis not present

## 2016-09-28 DIAGNOSIS — I10 Essential (primary) hypertension: Secondary | ICD-10-CM | POA: Diagnosis not present

## 2016-09-28 DIAGNOSIS — L02211 Cutaneous abscess of abdominal wall: Secondary | ICD-10-CM | POA: Diagnosis not present

## 2016-09-28 DIAGNOSIS — L03311 Cellulitis of abdominal wall: Secondary | ICD-10-CM | POA: Diagnosis not present

## 2016-09-29 ENCOUNTER — Emergency Department (HOSPITAL_COMMUNITY)
Admission: EM | Admit: 2016-09-29 | Discharge: 2016-09-29 | Disposition: A | Discharge status: Home / Self Care | Payer: Medicare Other | Attending: Emergency Medicine | Admitting: Emergency Medicine

## 2016-09-29 ENCOUNTER — Encounter (HOSPITAL_COMMUNITY): Payer: Self-pay | Admitting: Vascular Surgery

## 2016-09-29 ENCOUNTER — Emergency Department (HOSPITAL_COMMUNITY): Payer: Medicare Other

## 2016-09-29 DIAGNOSIS — Z7901 Long term (current) use of anticoagulants: Secondary | ICD-10-CM | POA: Insufficient documentation

## 2016-09-29 DIAGNOSIS — R0602 Shortness of breath: Secondary | ICD-10-CM | POA: Insufficient documentation

## 2016-09-29 DIAGNOSIS — Z79899 Other long term (current) drug therapy: Secondary | ICD-10-CM | POA: Diagnosis not present

## 2016-09-29 DIAGNOSIS — I10 Essential (primary) hypertension: Secondary | ICD-10-CM | POA: Insufficient documentation

## 2016-09-29 DIAGNOSIS — Z8541 Personal history of malignant neoplasm of cervix uteri: Secondary | ICD-10-CM | POA: Diagnosis not present

## 2016-09-29 DIAGNOSIS — R1110 Vomiting, unspecified: Secondary | ICD-10-CM | POA: Insufficient documentation

## 2016-09-29 DIAGNOSIS — Z85038 Personal history of other malignant neoplasm of large intestine: Secondary | ICD-10-CM | POA: Insufficient documentation

## 2016-09-29 DIAGNOSIS — Z87891 Personal history of nicotine dependence: Secondary | ICD-10-CM | POA: Diagnosis not present

## 2016-09-29 DIAGNOSIS — J45909 Unspecified asthma, uncomplicated: Secondary | ICD-10-CM | POA: Diagnosis not present

## 2016-09-29 LAB — COMPREHENSIVE METABOLIC PANEL
ALBUMIN: 3.1 g/dL — AB (ref 3.5–5.0)
ALT: 22 U/L (ref 14–54)
ANION GAP: 11 (ref 5–15)
AST: 23 U/L (ref 15–41)
Alkaline Phosphatase: 67 U/L (ref 38–126)
BILIRUBIN TOTAL: 0.7 mg/dL (ref 0.3–1.2)
BUN: 7 mg/dL (ref 6–20)
CHLORIDE: 107 mmol/L (ref 101–111)
CO2: 22 mmol/L (ref 22–32)
Calcium: 8.8 mg/dL — ABNORMAL LOW (ref 8.9–10.3)
Creatinine, Ser: 0.8 mg/dL (ref 0.44–1.00)
GFR calc Af Amer: >60 mL/min (ref >60)
GFR calc non Af Amer: >60 mL/min (ref >60)
GLUCOSE: 99 mg/dL (ref 65–99)
POTASSIUM: 3.3 mmol/L — AB (ref 3.5–5.1)
SODIUM: 140 mmol/L (ref 135–145)
TOTAL PROTEIN: 6.1 g/dL — AB (ref 6.5–8.1)

## 2016-09-29 LAB — CBC
HCT: 27.3 % — ABNORMAL LOW (ref 36.0–46.0)
Hemoglobin: 8.2 g/dL — ABNORMAL LOW (ref 12.0–15.0)
MCH: 19.7 pg — ABNORMAL LOW (ref 26.0–34.0)
MCHC: 30 g/dL (ref 30.0–36.0)
MCV: 65.6 fL — ABNORMAL LOW (ref 78.0–100.0)
PLATELETS: 271 10*3/uL (ref 150–400)
RBC: 4.16 MIL/uL (ref 3.87–5.11)
RDW: 20.6 % — ABNORMAL HIGH (ref 11.5–15.5)
WBC: 9.9 10*3/uL (ref 4.0–10.5)

## 2016-09-29 LAB — AEROBIC/ANAEROBIC CULTURE W GRAM STAIN (SURGICAL/DEEP WOUND)

## 2016-09-29 LAB — I-STAT TROPONIN, ED: TROPONIN I, POC: 0 ng/mL (ref 0.00–0.08)

## 2016-09-29 LAB — I-STAT CG4 LACTIC ACID, ED: Lactic Acid, Venous: 0.75 mmol/L (ref 0.5–1.9)

## 2016-09-29 LAB — AEROBIC/ANAEROBIC CULTURE (SURGICAL/DEEP WOUND)

## 2016-09-29 MED ORDER — HEPARIN SOD (PORK) LOCK FLUSH 100 UNIT/ML IV SOLN
500.0000 [IU] | Freq: Once | INTRAVENOUS | Status: AC
  Administered 2016-09-29: 500 [IU]
  Filled 2016-09-29: qty 5

## 2016-09-29 MED ORDER — ONDANSETRON 4 MG PO TBDP
4.0000 mg | ORAL_TABLET | Freq: Once | ORAL | Status: AC
  Administered 2016-09-29: 4 mg via ORAL

## 2016-09-29 MED ORDER — DOXYCYCLINE HYCLATE 100 MG PO CAPS: 100.0000 mg | ORAL_CAPSULE | Freq: Two times a day (BID) | ORAL | 0 refills | Status: DC

## 2016-09-29 MED ORDER — ONDANSETRON 4 MG PO TBDP
4.0000 mg | ORAL_TABLET | Freq: Once | ORAL | Status: AC
  Administered 2016-09-29: 4 mg via ORAL
  Filled 2016-09-29: qty 1

## 2016-09-29 MED ORDER — ONDANSETRON 4 MG PO TBDP
ORAL_TABLET | ORAL | Status: AC
  Filled 2016-09-29: qty 1

## 2016-09-29 NOTE — ED Triage Notes (Signed)
Pt reports to the ED for eval of N/V, SOB, chest pressure, and a low grade fever. Symptoms started this morning upon awakening. Fever at home 99.8. Denies any diarrhea. Also reports some orthopnea. She has been around people with illnesses as she was recently admitted to the hospital. Reports some chills and intermittent diaphoresis.

## 2016-09-29 NOTE — ED Provider Notes (Signed)
[facility] DEPT Provider Note   CSN: [id number] Arrival date & time: 09/29/16  1927     History   Chief Complaint Chief Complaint  Patient presents with  . Emesis  . Shortness of Breath    HPI Suzanne Macdonald is a 66 y.o. female.  Patient presents to the emergency department with chief complaint of shortness breath, cough, and vomiting. She was seen several days ago for abdominal wall abscess, and was admitted by surgery taken to the OR for incision and drainage. She states the symptoms have been improving, but today she began to have some cough, shortness breath, and vomiting. She denies any chest pain. She denies any fevers or chills. There are no other associated symptoms. There are no modifying factors.   The history is provided by the patient. No language interpreter was used.    Past Medical History:  Diagnosis Date  . Abdominal wall abscess 09/24/2016  . Arthritis    "throughout my body" (08/23/2016)  . Asthma   . Atrial fibrillation ([place])   . Cervical cancer ([place]) 1972  . Chronic lower back pain   . Colon cancer ([place]) 2015  . GERD (gastroesophageal reflux disease)   . H/O gastric bypass   . History of blood transfusion 1972; 1990s   "both w/OR"  . Hyperlipidemia   . Hypertension   . Insomnia   . Morbid obesity ([place]) 03/04/2016  . OSA (obstructive sleep apnea)    "can't afford the machine" (08/23/2016)  . Pneumonia    "probably 4 times in early childhood" (08/23/2016)    Patient Active Problem List   Diagnosis Date Noted  . Iron deficiency anemia 08/24/2016  . Abdominal wall cellulitis 08/11/2016  . Atrial flutter ([place]) 07/12/2016  . Atrial fibrillation with RVR ([place]) 07/11/2016  . Fever   . At risk for surgical site infection   . Gram-negative infection   . Abdominal wall abscess 06/10/2016  . Aortic atherosclerosis ([place]) 05/09/2016  . Morbid obesity ([place]) 05/04/2016  . Incarcerated hernia 04/28/2016  . Osteopenia 03/15/2016  . Atrial fibrillation  with tachycardic ventricular rate ([place])   . Asthma 03/02/2016  . Prediabetes 01/25/2016  . Essential hypertension 01/25/2016  . Vitamin D deficiency 01/25/2016  . 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 Vitamin B12 deficiency 01/25/2016  . Paroxysmal atrial fibrillation ([place]) 01/25/2016  . Health care maintenance 01/25/2016  . History of colon cancer 01/25/2016  . Ventral hernia 01/25/2016  . History of cervical cancer 01/25/2016    Past Surgical History:  Procedure Laterality Date  . ABDOMINAL HERNIA REPAIR  1990s  . BOWEL RESECTION  2015   [name], [name] by Dr. Stormy Fabian  . CARDIAC CATHETERIZATION  05/2014   in [place], no sig CAD (done prior to surgery for colon CA)  . [place]   "twins"  . COLON SURGERY    . DILATION AND CURETTAGE OF UTERUS  1960s X 1  . GASTRIC BYPASS  1992  . HERNIA REPAIR    . INSERTION OF MESH N/A 05/01/2016   Procedure: INSERTION OF MESH;  Surgeon: Excell Seltzer, MD;  Location: [facility];  Service: General;  Laterality: N/A;  . IR GENERIC HISTORICAL  06/28/2016   IR RADIOLOGIST EVAL & MGMT 06/28/2016 Markus Daft, MD [facility] INTERV RAD  . IR GENERIC HISTORICAL  08/07/2016   IR RADIOLOGIST EVAL & MGMT 08/07/2016 [facility] INTERV RAD  . LAPAROSCOPIC GASTRIC BANDING     Placed 2013 and removed in 2014.  Marland Kitchen TOTAL ABDOMINAL  HYSTERECTOMY  1998   For tumorous growth.  Marland Kitchen VACUUM ASSISTED CLOSURE CHANGE N/A 09/24/2016   Procedure: Incision and Drainage Abdominal Wall Abscess;  Surgeon: Stark Klein, MD;  Location: [facility];  Service: General;  Laterality: N/A;  . VENTRAL HERNIA REPAIR N/A 05/01/2016   Procedure: VENTRAL HERNIA REPAIR;  Surgeon: Excell Seltzer, MD;  Location: [facility];  Service: General;  Laterality: N/A;  . VENTRAL HERNIA REPAIR N/A 08/24/2016   Procedure: IRRIGATION AND DEBRIDEMENT OF ABDOMINAL WALL ABCESS;  Surgeon: Stark Klein, MD;  Location: [facility];  Service: General;  Laterality: N/A;    OB History    No data available       Home  Medications    Prior to Admission medications   Medication Sig Start Date End Date Taking? Authorizing Provider  acetaminophen (TYLENOL) 500 MG tablet Take 1,000 mg by mouth every 6 (six) hours as needed for mild pain or moderate pain.   Yes Historical Provider, MD  albuterol (PROVENTIL HFA;VENTOLIN HFA) 108 (90 Base) MCG/ACT inhaler Inhale 1-2 puffs into the lungs every 6 (six) hours as needed for wheezing or shortness of breath.   Yes Historical Provider, MD  amoxicillin-clavulanate (AUGMENTIN) 875-125 MG tablet Take 1 tablet by mouth every 12 (twelve) hours. 09/26/16 10/06/16 Yes Ophelia Shoulder, MD  apixaban (ELIQUIS) 5 MG TABS tablet Take 1 tablet (5 mg total) by mouth 2 (two) times daily. 03/13/16  Yes Shela Leff, MD  atorvastatin (LIPITOR) 10 MG tablet take 1 tablet by mouth once daily Patient taking differently: take 1 tablet by mouth once daily in morning 04/04/16  Yes Shela Leff, MD  budesonide-formoterol (SYMBICORT) 160-4.5 MCG/ACT inhaler Inhale 2 puffs into the lungs 2 (two) times daily. Patient taking differently: Inhale 2 puffs into the lungs 2 (two) times daily as needed (for shortness of breath).  01/24/16  Yes Milagros Loll, MD  Calcium-Phosphorus-Vitamin D (CALCIUM/VITAMIN D3/ADULT GUMMY PO) Take 2 each by mouth daily.   Yes Historical Provider, MD  carvedilol (COREG) 12.5 MG tablet Take 1 tablet (12.5 mg total) by mouth 2 (two) times daily with a meal. 07/23/16  Yes Rhonda G Barrett, PA-C  Cholecalciferol (VITAMIN D3) 2000 units CHEW Chew 2,000 Units by mouth 2 (two) times daily.   Yes Historical Provider, MD  cyanocobalamin 500 MCG tablet Take 1,000 mcg by mouth daily.    Yes Historical Provider, MD  diltiazem (CARDIZEM CD) 120 MG 24 hr capsule Take 1 capsule (120 mg total) by mouth daily. 07/13/16  Yes Alphonzo Grieve, MD  diltiazem (CARDIZEM) 30 MG tablet Take 1 tablet every 4 hours AS NEEDED for AFIB fast heart rate >100 as long as blood pressure >100. 07/30/16  Yes  Sherran Needs, NP  docusate sodium (COLACE) 100 MG capsule Take 1 capsule (100 mg total) by mouth 2 (two) times daily as needed for mild constipation. 06/14/16  Yes Ledell Noss, MD  ferrous sulfate 325 (65 FE) MG tablet Take 1 tablet (325 mg total) by mouth daily with breakfast. 09/26/16  Yes Kalman Drape, PA  flecainide (TAMBOCOR) 100 MG tablet take 1 tablet by mouth twice a day 08/06/16  Yes [name], MD  guaiFENesin (MUCINEX) 600 MG 12 hr tablet Take 600 mg by mouth 2 (two) times daily as needed for to loosen phlegm.   Yes Historical Provider, MD  guaifenesin (ROBITUSSIN) 100 MG/5ML syrup Take 200 mg by mouth 3 (three) times daily as needed for cough.   Yes Historical Provider, MD  lisinopril (PRINIVIL,ZESTRIL) 5  MG tablet take 1 tablet by mouth once daily 07/27/16  Yes [name], MD  loperamide (IMODIUM) 2 MG capsule Take 2 mg by mouth daily as needed for diarrhea or loose stools.   Yes Historical Provider, MD  Multiple Vitamins-Minerals (ALIVE WOMENS GUMMY) CHEW Chew 2 each by mouth daily.   Yes Historical Provider, MD  omeprazole (PRILOSEC) 40 MG capsule Take 1 capsule (40 mg total) by mouth daily. 04/27/16  Yes [name], MD  oxyCODONE-acetaminophen (PERCOCET) 7.5-325 MG tablet Take 1 tablet by mouth every 6 (six) hours as needed for severe pain. 09/26/16  Yes Ophelia Shoulder, MD  potassium chloride (K-DUR) 10 MEQ tablet 2 TABLETS BY MOUTH DAILY Patient taking differently: Take 20 mEq by mouth daily.  06/27/16  Yes [name], MD  traZODone (DESYREL) 100 MG tablet Take 100 mg by mouth at bedtime. 04/22/16  Yes Historical Provider, MD    Family History Family History  Problem Relation Age of Onset  . Heart disease Mother   . Diabetes Mother   . Heart disease Father   . Stroke Father   . Diabetes Father   . Diabetes Sister   . Thyroid disease Sister   . Early death Brother     Killed by drunk driver  . Heart attack Brother   . Thyroid disease Daughter     s/p  thyroidectomy  . Diabetes Sister   . Thyroid disease Sister   . Stroke Brother   . Drug abuse Brother   . Heart attack Brother     Social History Social History  Substance Use Topics  . Smoking status: Former Smoker    Packs/day: 1.00    Years: 45.00    Types: Cigarettes  . Smokeless tobacco: Never Used     Comment: "quit smoking mid 2000s"  . Alcohol use 0.0 oz/week     Comment: 08/23/2016 "a beer every now and then; none in over 1 year"     Allergies   Augmentin [amoxicillin-pot clavulanate]; Barium-containing compounds; Scallops [shellfish allergy]; and Vancomycin   Review of Systems Review of Systems  All other systems reviewed and are negative.    Physical Exam Updated Vital Signs BP 178/89   Pulse (!) 59   Temp 98.2 F (36.8 C) (Oral)   Resp 20   SpO2 96%   Physical Exam  Constitutional: She is oriented to person, place, and time. She appears well-developed and well-nourished.  HENT:  Head: Normocephalic and atraumatic.  Eyes: Conjunctivae and EOM are normal. Pupils are equal, round, and reactive to light.  Neck: Normal range of motion. Neck supple.  Cardiovascular: Normal rate and regular rhythm.  Exam reveals no gallop and no friction rub.   No murmur heard. Pulmonary/Chest: Effort normal and breath sounds normal. No respiratory distress. She has no wheezes. She has no rales. She exhibits no tenderness.  Lungs are clear to auscultation  Abdominal: Soft. Bowel sounds are normal. She exhibits no distension and no mass. There is no tenderness. There is no rebound and no guarding.  Mild tenderness around the abdominal incision  Musculoskeletal: Normal range of motion. She exhibits no edema or tenderness.  Neurological: She is alert and oriented to person, place, and time.  Skin: Skin is warm and dry.  Packing in place at incision and drainage site  Psychiatric: She has a normal mood and affect. Her behavior is normal. Judgment and thought content normal.    Nursing note and vitals reviewed.    ED Treatments / Results  Labs (all labs ordered are listed, but only abnormal results are displayed) Labs Reviewed  CBC - Abnormal; Notable for the following:       Result Value   Hemoglobin 8.2 (*)    HCT 27.3 (*)    MCV 65.6 (*)    MCH 19.7 (*)    RDW 20.6 (*)    All other components within normal limits  COMPREHENSIVE METABOLIC PANEL - Abnormal; Notable for the following:    Potassium 3.3 (*)    Calcium 8.8 (*)    Total Protein 6.1 (*)    Albumin 3.1 (*)    All other components within normal limits  I-STAT TROPOININ, ED  I-STAT CG4 LACTIC ACID, ED    EKG  EKG Interpretation None       Radiology Dg Chest 2 View  Result Date: 09/29/2016 CLINICAL DATA:  Shortness of breath, chest pressure, low-grade fever EXAM: CHEST  2 VIEW COMPARISON:  08/11/2006 FINDINGS: Mild left basilar opacity, likely atelectasis when correlating with the lateral view. No pleural effusion or pneumothorax. The heart is top-normal in size. Right chest port terminates in the lower SVC. Degenerative changes of the visualized thoracolumbar spine. IMPRESSION: Mild left basilar opacity, likely atelectasis. Electronically Signed   By: Julian Hy M.D.   On: 09/29/2016 20:51    Procedures Procedures (including critical care time)  Medications Ordered in ED Medications  ondansetron (ZOFRAN-ODT) disintegrating tablet 4 mg (4 mg Oral Given 09/29/16 1939)  ondansetron (ZOFRAN-ODT) disintegrating tablet 4 mg (4 mg Oral Given 09/29/16 2140)     Initial Impression / Assessment and Plan / ED Course  I have reviewed the triage vital signs and the nursing notes.  Pertinent labs & imaging results that were available during my care of the patient were reviewed by me and considered in my medical decision making (see chart for details).     Patient with cough, shortness breath, vomiting. Labs are reassuring. Chest x-ray shows mild basilar atelectasis. Patient has  normal vital signs. She is not in any apparent distress. I will plan to treat the patient with azithromycin and outpatient follow-up.  Patient seen by and discussed with Dr. Laneta Simmers, who agrees with plan for azithromycin and home.  Will give doxy instead of azithro due to interaction with flecainide.  No doxy, currently taking augmentin.  Final Clinical Impressions(s) / ED Diagnoses   Final diagnoses:  Non-intractable vomiting, presence of nausea not specified, unspecified vomiting type  Shortness of breath    New Prescriptions New Prescriptions   DOXYCYCLINE (VIBRAMYCIN) 100 MG CAPSULE    Take 1 capsule (100 mg total) by mouth 2 (two) times daily.     Montine Circle, PA-C 09/29/16 [place], PA-C 09/29/16 [id number]    Leo Grosser, MD 09/30/16 Rogene [place]

## 2016-09-29 NOTE — ED Notes (Signed)
Attempted to access pt's port x1.  Unsuccessful.

## 2016-10-01 DIAGNOSIS — I480 Paroxysmal atrial fibrillation: Secondary | ICD-10-CM | POA: Diagnosis not present

## 2016-10-01 DIAGNOSIS — I10 Essential (primary) hypertension: Secondary | ICD-10-CM | POA: Diagnosis not present

## 2016-10-01 DIAGNOSIS — L02211 Cutaneous abscess of abdominal wall: Secondary | ICD-10-CM | POA: Diagnosis not present

## 2016-10-01 DIAGNOSIS — T814XXD Infection following a procedure, subsequent encounter: Secondary | ICD-10-CM | POA: Diagnosis not present

## 2016-10-01 DIAGNOSIS — B9620 Unspecified Escherichia coli [E. coli] as the cause of diseases classified elsewhere: Secondary | ICD-10-CM | POA: Diagnosis not present

## 2016-10-01 DIAGNOSIS — L03311 Cellulitis of abdominal wall: Secondary | ICD-10-CM | POA: Diagnosis not present

## 2016-10-01 MED ORDER — APIXABAN 5 MG PO TABS: 5.0000 mg | ORAL_TABLET | Freq: Two times a day (BID) | ORAL | 3 refills | Status: DC

## 2016-10-04 ENCOUNTER — Telehealth: Payer: Self-pay

## 2016-10-04 DIAGNOSIS — I480 Paroxysmal atrial fibrillation: Secondary | ICD-10-CM | POA: Diagnosis not present

## 2016-10-04 DIAGNOSIS — L03311 Cellulitis of abdominal wall: Secondary | ICD-10-CM | POA: Diagnosis not present

## 2016-10-04 DIAGNOSIS — B9620 Unspecified Escherichia coli [E. coli] as the cause of diseases classified elsewhere: Secondary | ICD-10-CM | POA: Diagnosis not present

## 2016-10-04 DIAGNOSIS — I10 Essential (primary) hypertension: Secondary | ICD-10-CM | POA: Diagnosis not present

## 2016-10-04 DIAGNOSIS — L02211 Cutaneous abscess of abdominal wall: Secondary | ICD-10-CM | POA: Diagnosis not present

## 2016-10-04 DIAGNOSIS — T814XXD Infection following a procedure, subsequent encounter: Secondary | ICD-10-CM | POA: Diagnosis not present

## 2016-10-04 NOTE — Telephone Encounter (Signed)
Spoke w/ HHN, at todays visit pt c/o cough, congestion- temp- normal, vs stable, 02 sat 96%, states she is taking all her meds as instructed. HHN according to assessment pt is doing well and lung field is good, pt will be instructed on increasing problems to be aware ofand to call 911, resident on call or clinic.  Please clarify ABX on med list- augmentin remains even though pt is allergic to it, doxycycline also is listed.

## 2016-10-04 NOTE — Telephone Encounter (Signed)
[name] from [facility] requesting VO. Please call back .

## 2016-10-04 NOTE — Telephone Encounter (Signed)
Thanks!  The Augmentin is for her abdominal wall abscess. I know this is listed as a medication allergy but she's been able to take this recently without difficulties. She is to continue this antibiotic and finish it as it was prescribed.  The doxycycline is not something I prescribed. I think this was prescribed by the emergency medicine provider in the last several days for her cough and shortness of breath. I feel I'm unable to comment on its use.  Thanks for instructing her that if her symptoms worsen she should call the clinic to speak to the on-call resident or 911.

## 2016-10-05 ENCOUNTER — Other Ambulatory Visit: Payer: Self-pay | Admitting: Internal Medicine

## 2016-10-08 DIAGNOSIS — I10 Essential (primary) hypertension: Secondary | ICD-10-CM | POA: Diagnosis not present

## 2016-10-08 DIAGNOSIS — B9620 Unspecified Escherichia coli [E. coli] as the cause of diseases classified elsewhere: Secondary | ICD-10-CM | POA: Diagnosis not present

## 2016-10-08 DIAGNOSIS — L03311 Cellulitis of abdominal wall: Secondary | ICD-10-CM | POA: Diagnosis not present

## 2016-10-08 DIAGNOSIS — I480 Paroxysmal atrial fibrillation: Secondary | ICD-10-CM | POA: Diagnosis not present

## 2016-10-08 DIAGNOSIS — T814XXD Infection following a procedure, subsequent encounter: Secondary | ICD-10-CM | POA: Diagnosis not present

## 2016-10-08 DIAGNOSIS — L02211 Cutaneous abscess of abdominal wall: Secondary | ICD-10-CM | POA: Diagnosis not present

## 2016-10-09 DIAGNOSIS — L03311 Cellulitis of abdominal wall: Secondary | ICD-10-CM | POA: Diagnosis not present

## 2016-10-09 DIAGNOSIS — B9620 Unspecified Escherichia coli [E. coli] as the cause of diseases classified elsewhere: Secondary | ICD-10-CM | POA: Diagnosis not present

## 2016-10-09 DIAGNOSIS — I10 Essential (primary) hypertension: Secondary | ICD-10-CM | POA: Diagnosis not present

## 2016-10-09 DIAGNOSIS — L02211 Cutaneous abscess of abdominal wall: Secondary | ICD-10-CM | POA: Diagnosis not present

## 2016-10-09 DIAGNOSIS — I480 Paroxysmal atrial fibrillation: Secondary | ICD-10-CM | POA: Diagnosis not present

## 2016-10-09 DIAGNOSIS — T814XXD Infection following a procedure, subsequent encounter: Secondary | ICD-10-CM | POA: Diagnosis not present

## 2016-10-11 ENCOUNTER — Encounter: Payer: Self-pay | Admitting: Internal Medicine

## 2016-10-11 ENCOUNTER — Other Ambulatory Visit: Payer: Self-pay

## 2016-10-11 ENCOUNTER — Ambulatory Visit (INDEPENDENT_AMBULATORY_CARE_PROVIDER_SITE_OTHER): Payer: Medicare Other | Admitting: Internal Medicine

## 2016-10-11 DIAGNOSIS — F5101 Primary insomnia: Secondary | ICD-10-CM

## 2016-10-11 DIAGNOSIS — G2581 Restless legs syndrome: Secondary | ICD-10-CM

## 2016-10-11 DIAGNOSIS — I480 Paroxysmal atrial fibrillation: Secondary | ICD-10-CM | POA: Diagnosis not present

## 2016-10-11 DIAGNOSIS — G4733 Obstructive sleep apnea (adult) (pediatric): Secondary | ICD-10-CM | POA: Diagnosis not present

## 2016-10-11 DIAGNOSIS — Z8349 Family history of other endocrine, nutritional and metabolic diseases: Secondary | ICD-10-CM

## 2016-10-11 DIAGNOSIS — L600 Ingrowing nail: Secondary | ICD-10-CM | POA: Diagnosis not present

## 2016-10-11 DIAGNOSIS — I10 Essential (primary) hypertension: Secondary | ICD-10-CM | POA: Diagnosis present

## 2016-10-11 DIAGNOSIS — Z87891 Personal history of nicotine dependence: Secondary | ICD-10-CM | POA: Diagnosis not present

## 2016-10-11 DIAGNOSIS — K219 Gastro-esophageal reflux disease without esophagitis: Secondary | ICD-10-CM | POA: Insufficient documentation

## 2016-10-11 DIAGNOSIS — G4700 Insomnia, unspecified: Secondary | ICD-10-CM | POA: Insufficient documentation

## 2016-10-11 DIAGNOSIS — Z0000 Encounter for general adult medical examination without abnormal findings: Secondary | ICD-10-CM

## 2016-10-11 DIAGNOSIS — M8580 Other specified disorders of bone density and structure, unspecified site: Secondary | ICD-10-CM

## 2016-10-11 DIAGNOSIS — E46 Unspecified protein-calorie malnutrition: Secondary | ICD-10-CM | POA: Diagnosis not present

## 2016-10-11 DIAGNOSIS — Z79899 Other long term (current) drug therapy: Secondary | ICD-10-CM | POA: Diagnosis not present

## 2016-10-11 DIAGNOSIS — Z7901 Long term (current) use of anticoagulants: Secondary | ICD-10-CM

## 2016-10-11 DIAGNOSIS — J45909 Unspecified asthma, uncomplicated: Secondary | ICD-10-CM | POA: Diagnosis not present

## 2016-10-11 DIAGNOSIS — E440 Moderate protein-calorie malnutrition: Secondary | ICD-10-CM

## 2016-10-11 DIAGNOSIS — M8589 Other specified disorders of bone density and structure, multiple sites: Secondary | ICD-10-CM

## 2016-10-11 DIAGNOSIS — E538 Deficiency of other specified B group vitamins: Secondary | ICD-10-CM | POA: Diagnosis not present

## 2016-10-11 DIAGNOSIS — Z6841 Body Mass Index (BMI) 40.0 and over, adult: Secondary | ICD-10-CM | POA: Diagnosis not present

## 2016-10-11 DIAGNOSIS — E559 Vitamin D deficiency, unspecified: Secondary | ICD-10-CM

## 2016-10-11 DIAGNOSIS — J4520 Mild intermittent asthma, uncomplicated: Secondary | ICD-10-CM

## 2016-10-11 DIAGNOSIS — I700 Atherosclerosis of aorta: Secondary | ICD-10-CM

## 2016-10-11 DIAGNOSIS — D509 Iron deficiency anemia, unspecified: Secondary | ICD-10-CM

## 2016-10-11 MED ORDER — TRAZODONE HCL 100 MG PO TABS: 100.0000 mg | ORAL_TABLET | Freq: Every day | ORAL | 1 refills | Status: DC

## 2016-10-11 MED ORDER — PREGABALIN 75 MG PO CAPS: 75.0000 mg | ORAL_CAPSULE | Freq: Every day | ORAL | 0 refills | Status: DC

## 2016-10-11 NOTE — Assessment & Plan Note (Addendum)
This problem is chronic and stable. She has symptomatic A. fib and possibly also a flutter.. She identifies Dr. Oval Linsey as her cardiologist. She is rate controlled with Coreg 12.5 and cardiazem 120. She also has Rx for PRN cardiazem. She does experience palpitations and is currently irr irr. She is on eliquis and flecanide.  PLAN:  Cont current meds

## 2016-10-11 NOTE — Assessment & Plan Note (Signed)
This problem is chronic and stable. She lost from 316 in Sept 2017 to 276 today likely 2/2 medical illness and surgeries. Her functional status is limited and she is participating in home PT.   Plan : offer nutritional consult next appt

## 2016-10-11 NOTE — Assessment & Plan Note (Signed)
This problem is chronic and stable. Her symptoms are very well controlled on a PPI which she takes daily. She has had GERD since her 11s.  PLAN:  Cont current meds

## 2016-10-11 NOTE — Assessment & Plan Note (Signed)
I confirmed her h/o colon cancer (tx surgery and chemo) and cervical cancer ([facility]). No path report available. Will need to discuss colonoscopy next appt and pap smears.

## 2016-10-11 NOTE — Assessment & Plan Note (Signed)
This problem is chronic and stable. She has never slept well. She is using trazodone for 8 years and is currently on 100 but states it is not helping. She mostly has trouble staying asleep but also some trouble falling asleep. She lays down between 10 and 11 PM. She intermittently sleeps but at 3 AM she falls asleep and stays asleep until 8 or 8:30 in the morning. She is getting between 5 and 6 hours of sleep at night. She naps twice during the day for about 30 minutes. She is very sleepy when she lays down at night. She does have sleep apnea but cannot afford the machine. She also finds it hard to lay down flat at night due to her weight and sleeps better in the recliner.  Her insomnia is multifactorial including restless leg syndrome, obesity, obstructive sleep apnea, and poor sleep hygiene. She has not had a response to trazodone 100.  Plan : I will start by treating her restless leg syndrome. I do not want to start a hypnotic due to her age and likelihood of a fall. We'll need to discuss sleep hygiene further. She is losing weight but unfortunately not in a good way. She will return in 1 month to see how her restless leg syndrome is improving.

## 2016-10-11 NOTE — Patient Instructions (Addendum)
1. I will send in a medicine for your restless leg syndrome 2. Make [facility] appt 28th PM for toenail removal 3. See me in 1 month to work on sleep

## 2016-10-11 NOTE — Progress Notes (Signed)
   Subjective:    Patient ID: Suzanne Macdonald, female    DOB: June 25, 1951, 66 y.o.   MRN: [id number]  HPI  Suzanne Macdonald is here for insomnia. Please see the A&P for the status of the pt's chronic medical problems.  ROS : per ROS section and in problem oriented charting. All other systems are negative.  PMHx, Soc hx, and / or Fam hx : Daughter had thyroid removed. 2 sisters have thyroid issues. Getting PT and wound care at home. She is on disability and is getting $450 a month. Med hx inc A Fib, HTN, asthma, HLD, insomnia.   Review of Systems  Constitutional: Positive for diaphoresis.  HENT: Positive for rhinorrhea and sneezing.   Eyes: Positive for itching.  Respiratory: Positive for shortness of breath.        +DOE  Cardiovascular: Negative for chest pain.  Gastrointestinal: Negative for constipation.  Musculoskeletal: Positive for arthralgias, back pain, gait problem and myalgias.  Skin: Positive for wound.       Wound, pain, drainage R great toenail, lateral border  Neurological:       +RLS B  Psychiatric/Behavioral: Positive for sleep disturbance.       Objective:   Physical Exam  Constitutional: She appears well-developed and well-nourished. No distress.  HENT:  Head: Normocephalic and atraumatic.  Right Ear: External ear normal.  Left Ear: External ear normal.  Nose: Nose normal.  Eyes: Conjunctivae and EOM are normal. Right eye exhibits no discharge. Left eye exhibits no discharge. No scleral icterus.  Cardiovascular: Normal rate.   No murmur heard. +2 DP pulses B irr irr   Pulmonary/Chest: Effort normal and breath sounds normal. She has no wheezes.  Musculoskeletal: She exhibits edema. She exhibits no tenderness.  Neurological: She is alert.  Skin: Skin is warm and dry. She is not diaphoretic.  Ingrown toenail R great toenail lateral edge, no sig erythema, covers nail close to nail bed, tender, slight pus  Psychiatric: She has a normal mood and affect. Her  behavior is normal. Judgment and thought content normal.          Assessment & Plan:

## 2016-10-11 NOTE — Assessment & Plan Note (Signed)
This problem is chronic and stable. HgB baseline is about 8. Ferritin was 36 earlier this month and she is on FeSO4 QD. Recalls prior transfusion with surgeries but no iron infusion. May be exacerbating RLS.  PLAN : Cont FeSO4 Ferritin next appt Consider IV iron

## 2016-10-11 NOTE — Assessment & Plan Note (Signed)
This problem is chronic and stable. In addition to her insomnia, she complains of difficulty every night with both legs. She has a crawling sensation from her ankles to her knees. They also ache and she feels like she constantly has to new for her legs. She is constantly waking up at night due to this and her baseline insomnia. She request treatment for the restless leg syndrome.  She has iron deficiency anemia which could be contributing to her restless leg syndrome. She is on iron supplementation but her most recent ferritin was 36. She has not noticed improvement with iron supplementation but this was just started in January a month or so ago. This is probably too early to see improvement. Up-to-date recommends iron supplementation either orally or intravenously with a goal ferritin of greater than 75.  She also has significant insomnia. Looking at the treatment options for restless leg syndrome, an alpha 2 delta calcium channel ligand was recommended when there is also insomnia.I chose pregabalin and started on low-dose of 75.this can be increased if no better. Other options would be gabapentin or changing to a dopamine agonist.  Plan : pregabalin 75 Check ferritin next appointment Check response in 1 month Titrate up pregabalin dose if needed

## 2016-10-11 NOTE — Telephone Encounter (Signed)
Needs to speak with a nurse regarding meds.  

## 2016-10-11 NOTE — Assessment & Plan Note (Signed)
This problem is chronic and stable. She confirms she has asthma. She uses symbicort daily and alb "not often." she gets DOE. Exam is nl today.  PLAN:  Cont current meds

## 2016-10-11 NOTE — Assessment & Plan Note (Signed)
This problem is chronic and stable. Vitamin D was 24 in June 20117. She is on Vit D orally.  PLAN : cont med Check Vit D level next appt

## 2016-10-11 NOTE — Assessment & Plan Note (Addendum)
This problem is chronic and stable. This was an incidental finding on CT scan. She is on a statin. She is not diabetic. He She does not smoke.  Plan : Risk factor management

## 2016-10-11 NOTE — Assessment & Plan Note (Signed)
This problem is chronic and stable. She is unaware of this dx. Dexa results entered into overview. She does not meet criteria for bisphosphonates. She is on calcium and Vit D. Weight bearing limited 2/2 freq hospitalizations and deconditioning.   PLAN:  Cont current meds

## 2016-10-11 NOTE — Telephone Encounter (Signed)
Per dr Software engineer called lyrica to [facility]

## 2016-10-11 NOTE — Assessment & Plan Note (Signed)
This problem is new. 3 years ago her cat stuck its claw into the lateral side of her right great toenail. She states it has never healed since then. It is painful and swollen and has a discharge. She had used topical antibacterial cream without any improvement. She has been on a boatload of antibiotics with her recent surgical issues. On examination, she has an ingrown toenail of her right great toenail with the soft tissue covering the proximal lateral nail close to the nail bed. She has tenderness and a slight exudate. She is agreeable to ingrown toenail removal in [facility] next week. I discussed with Dr. Evette Doffing who is agreeable.  PLAN : Ingrown toenail removal on the 28th in the afternoon

## 2016-10-11 NOTE — Assessment & Plan Note (Signed)
This problem is chronic and stable. She is on coreg, cardiazem, and lisinopril 5. No SE. BP great.  PLAN:  Cont current meds   BP Readings from Last 3 Encounters:  10/11/16 138/62  09/29/16 178/89  09/26/16 (!) 139/55

## 2016-10-11 NOTE — Assessment & Plan Note (Signed)
This problem is chronic and stable. Her level was 283 earlier this month. She is on oral supplementation. No sxs.  PLAN:  Cont current meds

## 2016-10-12 DIAGNOSIS — T814XXD Infection following a procedure, subsequent encounter: Secondary | ICD-10-CM | POA: Diagnosis not present

## 2016-10-12 DIAGNOSIS — L03311 Cellulitis of abdominal wall: Secondary | ICD-10-CM | POA: Diagnosis not present

## 2016-10-12 DIAGNOSIS — B9620 Unspecified Escherichia coli [E. coli] as the cause of diseases classified elsewhere: Secondary | ICD-10-CM | POA: Diagnosis not present

## 2016-10-12 DIAGNOSIS — I10 Essential (primary) hypertension: Secondary | ICD-10-CM | POA: Diagnosis not present

## 2016-10-12 DIAGNOSIS — L02211 Cutaneous abscess of abdominal wall: Secondary | ICD-10-CM | POA: Diagnosis not present

## 2016-10-12 DIAGNOSIS — I480 Paroxysmal atrial fibrillation: Secondary | ICD-10-CM | POA: Diagnosis not present

## 2016-10-16 ENCOUNTER — Telehealth: Payer: Self-pay | Admitting: Internal Medicine

## 2016-10-16 DIAGNOSIS — I480 Paroxysmal atrial fibrillation: Secondary | ICD-10-CM | POA: Diagnosis not present

## 2016-10-16 DIAGNOSIS — B9620 Unspecified Escherichia coli [E. coli] as the cause of diseases classified elsewhere: Secondary | ICD-10-CM | POA: Diagnosis not present

## 2016-10-16 DIAGNOSIS — L03311 Cellulitis of abdominal wall: Secondary | ICD-10-CM | POA: Diagnosis not present

## 2016-10-16 DIAGNOSIS — I10 Essential (primary) hypertension: Secondary | ICD-10-CM | POA: Diagnosis not present

## 2016-10-16 DIAGNOSIS — L02211 Cutaneous abscess of abdominal wall: Secondary | ICD-10-CM | POA: Diagnosis not present

## 2016-10-16 DIAGNOSIS — T814XXD Infection following a procedure, subsequent encounter: Secondary | ICD-10-CM | POA: Diagnosis not present

## 2016-10-16 NOTE — Telephone Encounter (Signed)
APT. REMINDER CALL, LMTCB °

## 2016-10-17 ENCOUNTER — Ambulatory Visit: Payer: Medicare Other

## 2016-10-18 ENCOUNTER — Other Ambulatory Visit: Payer: Self-pay | Admitting: Cardiovascular Disease

## 2016-10-18 DIAGNOSIS — T814XXD Infection following a procedure, subsequent encounter: Secondary | ICD-10-CM | POA: Diagnosis not present

## 2016-10-18 DIAGNOSIS — I480 Paroxysmal atrial fibrillation: Secondary | ICD-10-CM | POA: Diagnosis not present

## 2016-10-18 DIAGNOSIS — L02211 Cutaneous abscess of abdominal wall: Secondary | ICD-10-CM | POA: Diagnosis not present

## 2016-10-18 DIAGNOSIS — L03311 Cellulitis of abdominal wall: Secondary | ICD-10-CM | POA: Diagnosis not present

## 2016-10-18 DIAGNOSIS — I10 Essential (primary) hypertension: Secondary | ICD-10-CM | POA: Diagnosis not present

## 2016-10-18 DIAGNOSIS — B9620 Unspecified Escherichia coli [E. coli] as the cause of diseases classified elsewhere: Secondary | ICD-10-CM | POA: Diagnosis not present

## 2016-10-18 NOTE — Telephone Encounter (Signed)
Refill Request.  

## 2016-10-19 DIAGNOSIS — I480 Paroxysmal atrial fibrillation: Secondary | ICD-10-CM | POA: Diagnosis not present

## 2016-10-19 DIAGNOSIS — T814XXD Infection following a procedure, subsequent encounter: Secondary | ICD-10-CM | POA: Diagnosis not present

## 2016-10-19 DIAGNOSIS — L02211 Cutaneous abscess of abdominal wall: Secondary | ICD-10-CM | POA: Diagnosis not present

## 2016-10-19 DIAGNOSIS — L03311 Cellulitis of abdominal wall: Secondary | ICD-10-CM | POA: Diagnosis not present

## 2016-10-19 DIAGNOSIS — B9620 Unspecified Escherichia coli [E. coli] as the cause of diseases classified elsewhere: Secondary | ICD-10-CM | POA: Diagnosis not present

## 2016-10-19 DIAGNOSIS — I10 Essential (primary) hypertension: Secondary | ICD-10-CM | POA: Diagnosis not present

## 2016-10-19 NOTE — Telephone Encounter (Signed)
Rx(s) sent to pharmacy electronically.  

## 2016-10-25 DIAGNOSIS — I480 Paroxysmal atrial fibrillation: Secondary | ICD-10-CM | POA: Diagnosis not present

## 2016-10-25 DIAGNOSIS — T814XXD Infection following a procedure, subsequent encounter: Secondary | ICD-10-CM | POA: Diagnosis not present

## 2016-10-25 DIAGNOSIS — L03311 Cellulitis of abdominal wall: Secondary | ICD-10-CM | POA: Diagnosis not present

## 2016-10-25 DIAGNOSIS — L02211 Cutaneous abscess of abdominal wall: Secondary | ICD-10-CM | POA: Diagnosis not present

## 2016-10-25 DIAGNOSIS — I10 Essential (primary) hypertension: Secondary | ICD-10-CM | POA: Diagnosis not present

## 2016-10-25 DIAGNOSIS — B9620 Unspecified Escherichia coli [E. coli] as the cause of diseases classified elsewhere: Secondary | ICD-10-CM | POA: Diagnosis not present

## 2016-10-29 ENCOUNTER — Other Ambulatory Visit: Payer: Self-pay | Admitting: Cardiovascular Disease

## 2016-10-29 NOTE — Telephone Encounter (Signed)
Please review for refill. Thanks!  

## 2016-11-07 ENCOUNTER — Telehealth: Payer: Self-pay | Admitting: Internal Medicine

## 2016-11-07 NOTE — Telephone Encounter (Signed)
APT. REMINDER CALL, LMTCB °

## 2016-11-08 ENCOUNTER — Ambulatory Visit: Payer: Medicare Other | Admitting: Internal Medicine

## 2016-11-14 ENCOUNTER — Encounter (HOSPITAL_COMMUNITY): Payer: Self-pay | Admitting: Emergency Medicine

## 2016-11-14 DIAGNOSIS — L03311 Cellulitis of abdominal wall: Secondary | ICD-10-CM | POA: Diagnosis not present

## 2016-11-14 DIAGNOSIS — R509 Fever, unspecified: Secondary | ICD-10-CM | POA: Insufficient documentation

## 2016-11-14 DIAGNOSIS — I700 Atherosclerosis of aorta: Secondary | ICD-10-CM | POA: Diagnosis not present

## 2016-11-14 DIAGNOSIS — Z5321 Procedure and treatment not carried out due to patient leaving prior to being seen by health care provider: Secondary | ICD-10-CM | POA: Insufficient documentation

## 2016-11-14 DIAGNOSIS — K219 Gastro-esophageal reflux disease without esophagitis: Secondary | ICD-10-CM | POA: Diagnosis not present

## 2016-11-14 DIAGNOSIS — L02211 Cutaneous abscess of abdominal wall: Secondary | ICD-10-CM | POA: Insufficient documentation

## 2016-11-14 DIAGNOSIS — R197 Diarrhea, unspecified: Secondary | ICD-10-CM | POA: Diagnosis not present

## 2016-11-14 DIAGNOSIS — I10 Essential (primary) hypertension: Secondary | ICD-10-CM | POA: Diagnosis not present

## 2016-11-14 DIAGNOSIS — R1031 Right lower quadrant pain: Secondary | ICD-10-CM | POA: Diagnosis not present

## 2016-11-14 DIAGNOSIS — I480 Paroxysmal atrial fibrillation: Secondary | ICD-10-CM | POA: Diagnosis not present

## 2016-11-14 LAB — URINALYSIS, ROUTINE W REFLEX MICROSCOPIC
BILIRUBIN URINE: NEGATIVE
Glucose, UA: NEGATIVE mg/dL
HGB URINE DIPSTICK: NEGATIVE
Ketones, ur: NEGATIVE mg/dL
LEUKOCYTES UA: NEGATIVE
NITRITE: NEGATIVE
Protein, ur: NEGATIVE mg/dL
SPECIFIC GRAVITY, URINE: 1.02 (ref 1.005–1.030)
pH: 5.5 (ref 5.0–8.0)

## 2016-11-14 LAB — CBC WITH DIFFERENTIAL/PLATELET
BASOS ABS: 0 10*3/uL (ref 0.0–0.1)
Basophils Relative: 0 %
EOS PCT: 1 %
Eosinophils Absolute: 0.1 10*3/uL (ref 0.0–0.7)
HCT: 36.3 % (ref 36.0–46.0)
Hemoglobin: 11.4 g/dL — ABNORMAL LOW (ref 12.0–15.0)
LYMPHS ABS: 1.8 10*3/uL (ref 0.7–4.0)
Lymphocytes Relative: 17 %
MCH: 23.2 pg — ABNORMAL LOW (ref 26.0–34.0)
MCHC: 31.4 g/dL (ref 30.0–36.0)
MCV: 73.8 fL — AB (ref 78.0–100.0)
MONO ABS: 1.1 10*3/uL — AB (ref 0.1–1.0)
Monocytes Relative: 10 %
Neutro Abs: 7.8 10*3/uL — ABNORMAL HIGH (ref 1.7–7.7)
Neutrophils Relative %: 72 %
PLATELETS: 191 10*3/uL (ref 150–400)
RBC: 4.92 MIL/uL (ref 3.87–5.11)
RDW: 22.9 % — AB (ref 11.5–15.5)
WBC: 10.8 10*3/uL — AB (ref 4.0–10.5)

## 2016-11-14 LAB — COMPREHENSIVE METABOLIC PANEL
ALT: 24 U/L (ref 14–54)
ANION GAP: 10 (ref 5–15)
AST: 26 U/L (ref 15–41)
Albumin: 3.3 g/dL — ABNORMAL LOW (ref 3.5–5.0)
Alkaline Phosphatase: 102 U/L (ref 38–126)
BUN: 15 mg/dL (ref 6–20)
CHLORIDE: 102 mmol/L (ref 101–111)
CO2: 22 mmol/L (ref 22–32)
Calcium: 8.6 mg/dL — ABNORMAL LOW (ref 8.9–10.3)
Creatinine, Ser: 0.86 mg/dL (ref 0.44–1.00)
GFR calc Af Amer: >60 mL/min (ref >60)
Glucose, Bld: 152 mg/dL — ABNORMAL HIGH (ref 65–99)
POTASSIUM: 3.5 mmol/L (ref 3.5–5.1)
Sodium: 134 mmol/L — ABNORMAL LOW (ref 135–145)
Total Bilirubin: 0.3 mg/dL (ref 0.3–1.2)
Total Protein: 6.2 g/dL — ABNORMAL LOW (ref 6.5–8.1)

## 2016-11-14 NOTE — ED Triage Notes (Signed)
Pt. 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

## 2016-11-15 ENCOUNTER — Emergency Department (HOSPITAL_COMMUNITY)
Admission: EM | Admit: 2016-11-15 | Discharge: 2016-11-15 | Disposition: A | Discharge status: Home / Self Care | Payer: Medicare Other | Source: Home / Self Care | Attending: Emergency Medicine | Admitting: Emergency Medicine

## 2016-11-15 LAB — I-STAT CG4 LACTIC ACID, ED: LACTIC ACID, VENOUS: 1.47 mmol/L (ref 0.5–1.9)

## 2016-11-15 NOTE — ED Notes (Signed)
No answer in lobby.

## 2016-11-16 ENCOUNTER — Inpatient Hospital Stay (HOSPITAL_COMMUNITY)
Admission: EM | Admit: 2016-11-16 | Discharge: 2016-11-21 | DRG: 571 | Disposition: A | Discharge status: Home Health | Payer: Medicare Other | Attending: Oncology | Admitting: Oncology

## 2016-11-16 ENCOUNTER — Emergency Department (HOSPITAL_COMMUNITY): Payer: Medicare Other

## 2016-11-16 ENCOUNTER — Encounter (HOSPITAL_COMMUNITY): Payer: Self-pay | Admitting: Adult Health

## 2016-11-16 DIAGNOSIS — Z8719 Personal history of other diseases of the digestive system: Secondary | ICD-10-CM | POA: Diagnosis not present

## 2016-11-16 DIAGNOSIS — M1990 Unspecified osteoarthritis, unspecified site: Secondary | ICD-10-CM | POA: Diagnosis present

## 2016-11-16 DIAGNOSIS — E785 Hyperlipidemia, unspecified: Secondary | ICD-10-CM | POA: Diagnosis not present

## 2016-11-16 DIAGNOSIS — R197 Diarrhea, unspecified: Secondary | ICD-10-CM | POA: Diagnosis present

## 2016-11-16 DIAGNOSIS — I480 Paroxysmal atrial fibrillation: Secondary | ICD-10-CM | POA: Diagnosis present

## 2016-11-16 DIAGNOSIS — G8929 Other chronic pain: Secondary | ICD-10-CM | POA: Diagnosis not present

## 2016-11-16 DIAGNOSIS — Z7901 Long term (current) use of anticoagulants: Secondary | ICD-10-CM

## 2016-11-16 DIAGNOSIS — I700 Atherosclerosis of aorta: Secondary | ICD-10-CM | POA: Diagnosis present

## 2016-11-16 DIAGNOSIS — Z880 Allergy status to penicillin: Secondary | ICD-10-CM

## 2016-11-16 DIAGNOSIS — Z888 Allergy status to other drugs, medicaments and biological substances status: Secondary | ICD-10-CM

## 2016-11-16 DIAGNOSIS — K219 Gastro-esophageal reflux disease without esophagitis: Secondary | ICD-10-CM | POA: Diagnosis present

## 2016-11-16 DIAGNOSIS — E876 Hypokalemia: Secondary | ICD-10-CM | POA: Diagnosis not present

## 2016-11-16 DIAGNOSIS — I10 Essential (primary) hypertension: Secondary | ICD-10-CM | POA: Diagnosis present

## 2016-11-16 DIAGNOSIS — Z7951 Long term (current) use of inhaled steroids: Secondary | ICD-10-CM | POA: Diagnosis not present

## 2016-11-16 DIAGNOSIS — Z91013 Allergy to seafood: Secondary | ICD-10-CM

## 2016-11-16 DIAGNOSIS — Z8541 Personal history of malignant neoplasm of cervix uteri: Secondary | ICD-10-CM | POA: Diagnosis not present

## 2016-11-16 DIAGNOSIS — Z8249 Family history of ischemic heart disease and other diseases of the circulatory system: Secondary | ICD-10-CM

## 2016-11-16 DIAGNOSIS — G4733 Obstructive sleep apnea (adult) (pediatric): Secondary | ICD-10-CM | POA: Diagnosis present

## 2016-11-16 DIAGNOSIS — Z79899 Other long term (current) drug therapy: Secondary | ICD-10-CM | POA: Diagnosis not present

## 2016-11-16 DIAGNOSIS — L03311 Cellulitis of abdominal wall: Secondary | ICD-10-CM | POA: Diagnosis not present

## 2016-11-16 DIAGNOSIS — J45909 Unspecified asthma, uncomplicated: Secondary | ICD-10-CM | POA: Diagnosis not present

## 2016-11-16 DIAGNOSIS — Z85038 Personal history of other malignant neoplasm of large intestine: Secondary | ICD-10-CM

## 2016-11-16 DIAGNOSIS — Z90710 Acquired absence of both cervix and uterus: Secondary | ICD-10-CM

## 2016-11-16 DIAGNOSIS — Z6841 Body Mass Index (BMI) 40.0 and over, adult: Secondary | ICD-10-CM

## 2016-11-16 DIAGNOSIS — B9689 Other specified bacterial agents as the cause of diseases classified elsewhere: Secondary | ICD-10-CM | POA: Diagnosis not present

## 2016-11-16 DIAGNOSIS — Z9884 Bariatric surgery status: Secondary | ICD-10-CM

## 2016-11-16 DIAGNOSIS — R110 Nausea: Secondary | ICD-10-CM | POA: Diagnosis not present

## 2016-11-16 DIAGNOSIS — Z87891 Personal history of nicotine dependence: Secondary | ICD-10-CM

## 2016-11-16 DIAGNOSIS — R42 Dizziness and giddiness: Secondary | ICD-10-CM | POA: Diagnosis not present

## 2016-11-16 DIAGNOSIS — R509 Fever, unspecified: Secondary | ICD-10-CM | POA: Diagnosis not present

## 2016-11-16 DIAGNOSIS — M545 Low back pain: Secondary | ICD-10-CM | POA: Diagnosis present

## 2016-11-16 DIAGNOSIS — B961 Klebsiella pneumoniae [K. pneumoniae] as the cause of diseases classified elsewhere: Secondary | ICD-10-CM | POA: Diagnosis present

## 2016-11-16 DIAGNOSIS — Z881 Allergy status to other antibiotic agents status: Secondary | ICD-10-CM

## 2016-11-16 DIAGNOSIS — L02211 Cutaneous abscess of abdominal wall: Secondary | ICD-10-CM | POA: Diagnosis not present

## 2016-11-16 DIAGNOSIS — Z9889 Other specified postprocedural states: Secondary | ICD-10-CM | POA: Diagnosis not present

## 2016-11-16 DIAGNOSIS — Z1611 Resistance to penicillins: Secondary | ICD-10-CM | POA: Diagnosis not present

## 2016-11-16 DIAGNOSIS — R1031 Right lower quadrant pain: Secondary | ICD-10-CM | POA: Diagnosis not present

## 2016-11-16 LAB — CBC WITH DIFFERENTIAL/PLATELET
Basophils Absolute: 0 10*3/uL (ref 0.0–0.1)
Basophils Relative: 0 %
Eosinophils Absolute: 0.1 10*3/uL (ref 0.0–0.7)
Eosinophils Relative: 1 %
HEMATOCRIT: 35.2 % — AB (ref 36.0–46.0)
HEMOGLOBIN: 10.7 g/dL — AB (ref 12.0–15.0)
LYMPHS ABS: 1.3 10*3/uL (ref 0.7–4.0)
Lymphocytes Relative: 11 %
MCH: 22.5 pg — AB (ref 26.0–34.0)
MCHC: 30.4 g/dL (ref 30.0–36.0)
MCV: 73.9 fL — AB (ref 78.0–100.0)
MONO ABS: 1.2 10*3/uL — AB (ref 0.1–1.0)
MONOS PCT: 10 %
NEUTROS PCT: 78 %
Neutro Abs: 9.2 10*3/uL — ABNORMAL HIGH (ref 1.7–7.7)
Platelets: 190 10*3/uL (ref 150–400)
RBC: 4.76 MIL/uL (ref 3.87–5.11)
RDW: 22.7 % — ABNORMAL HIGH (ref 11.5–15.5)
WBC: 11.8 10*3/uL — ABNORMAL HIGH (ref 4.0–10.5)

## 2016-11-16 LAB — COMPREHENSIVE METABOLIC PANEL
ALBUMIN: 2.9 g/dL — AB (ref 3.5–5.0)
ALK PHOS: 73 U/L (ref 38–126)
ALT: 21 U/L (ref 14–54)
ANION GAP: 9 (ref 5–15)
AST: 19 U/L (ref 15–41)
BUN: 9 mg/dL (ref 6–20)
CO2: 23 mmol/L (ref 22–32)
Calcium: 8.3 mg/dL — ABNORMAL LOW (ref 8.9–10.3)
Chloride: 105 mmol/L (ref 101–111)
Creatinine, Ser: 0.88 mg/dL (ref 0.44–1.00)
GFR calc Af Amer: >60 mL/min (ref >60)
GFR calc non Af Amer: >60 mL/min (ref >60)
GLUCOSE: 145 mg/dL — AB (ref 65–99)
POTASSIUM: 3.3 mmol/L — AB (ref 3.5–5.1)
SODIUM: 137 mmol/L (ref 135–145)
Total Bilirubin: 0.9 mg/dL (ref 0.3–1.2)
Total Protein: 6.1 g/dL — ABNORMAL LOW (ref 6.5–8.1)

## 2016-11-16 LAB — URINALYSIS, ROUTINE W REFLEX MICROSCOPIC
Bilirubin Urine: NEGATIVE
GLUCOSE, UA: NEGATIVE mg/dL
Hgb urine dipstick: NEGATIVE
Ketones, ur: NEGATIVE mg/dL
LEUKOCYTES UA: NEGATIVE
Nitrite: NEGATIVE
PROTEIN: NEGATIVE mg/dL
SPECIFIC GRAVITY, URINE: 1.035 — AB (ref 1.005–1.030)
pH: 5 (ref 5.0–8.0)

## 2016-11-16 LAB — I-STAT CG4 LACTIC ACID, ED
Lactic Acid, Venous: 1.05 mmol/L (ref 0.5–1.9)
Lactic Acid, Venous: 0.85 mmol/L (ref 0.5–1.9)

## 2016-11-16 LAB — PROTIME-INR
INR: 1.43
Prothrombin Time: 17.5 seconds — ABNORMAL HIGH (ref 11.4–15.2)

## 2016-11-16 MED ORDER — TRAZODONE HCL 100 MG PO TABS
100.0000 mg | ORAL_TABLET | Freq: Every day | ORAL | Status: DC
  Administered 2016-11-16 – 2016-11-20 (×5): 100 mg via ORAL
  Filled 2016-11-16 (×5): qty 1

## 2016-11-16 MED ORDER — DILTIAZEM HCL ER COATED BEADS 120 MG PO CP24
120.0000 mg | ORAL_CAPSULE | Freq: Every day | ORAL | Status: DC
  Administered 2016-11-17 – 2016-11-21 (×4): 120 mg via ORAL
  Filled 2016-11-16 (×5): qty 1

## 2016-11-16 MED ORDER — ATORVASTATIN CALCIUM 10 MG PO TABS
10.0000 mg | ORAL_TABLET | Freq: Every day | ORAL | Status: DC
  Administered 2016-11-17 – 2016-11-21 (×5): 10 mg via ORAL
  Filled 2016-11-16 (×5): qty 1

## 2016-11-16 MED ORDER — CEFAZOLIN IN D5W 1 GM/50ML IV SOLN
1.0000 g | Freq: Once | INTRAVENOUS | Status: AC
  Administered 2016-11-16: 1 g via INTRAVENOUS
  Filled 2016-11-16: qty 50

## 2016-11-16 MED ORDER — FERROUS SULFATE 325 (65 FE) MG PO TABS
325.0000 mg | ORAL_TABLET | Freq: Every day | ORAL | Status: DC
  Administered 2016-11-17 – 2016-11-21 (×5): 325 mg via ORAL
  Filled 2016-11-16 (×5): qty 1

## 2016-11-16 MED ORDER — PANTOPRAZOLE SODIUM 40 MG PO TBEC: 40.0000 mg | DELAYED_RELEASE_TABLET | Freq: Every day | ORAL | Status: DC

## 2016-11-16 MED ORDER — ALBUTEROL SULFATE (2.5 MG/3ML) 0.083% IN NEBU: 2.5000 mg | INHALATION_SOLUTION | RESPIRATORY_TRACT | Status: DC | PRN

## 2016-11-16 MED ORDER — ACETAMINOPHEN 650 MG RE SUPP: 650.0000 mg | Freq: Four times a day (QID) | RECTAL | Status: DC | PRN

## 2016-11-16 MED ORDER — TRIAMCINOLONE ACETONIDE 0.1 % EX CREA
TOPICAL_CREAM | Freq: Two times a day (BID) | CUTANEOUS | Status: DC
Start: 2016-11-16 — End: 2016-11-21
  Administered 2016-11-16 – 2016-11-21 (×4): via TOPICAL
  Filled 2016-11-16: qty 15

## 2016-11-16 MED ORDER — HYDROMORPHONE HCL 1 MG/ML IJ SOLN
1.0000 mg | Freq: Once | INTRAMUSCULAR | Status: AC
  Administered 2016-11-16: 1 mg via INTRAVENOUS
  Filled 2016-11-16: qty 1

## 2016-11-16 MED ORDER — IOPAMIDOL (ISOVUE-300) INJECTION 61%
INTRAVENOUS | Status: AC
Start: 2016-11-16 — End: 2016-11-16
  Administered 2016-11-16: 100 mL
  Filled 2016-11-16: qty 100

## 2016-11-16 MED ORDER — ONDANSETRON 4 MG PO TBDP
8.0000 mg | ORAL_TABLET | Freq: Once | ORAL | Status: AC
  Administered 2016-11-16: 8 mg via ORAL
  Filled 2016-11-16: qty 2

## 2016-11-16 MED ORDER — HEPARIN SODIUM (PORCINE) 5000 UNIT/ML IJ SOLN
5000.0000 [IU] | Freq: Three times a day (TID) | INTRAMUSCULAR | Status: DC
  Administered 2016-11-17 – 2016-11-19 (×4): 5000 [IU] via SUBCUTANEOUS
  Filled 2016-11-16 (×4): qty 1

## 2016-11-16 MED ORDER — MORPHINE SULFATE (PF) 4 MG/ML IV SOLN
6.0000 mg | Freq: Once | INTRAVENOUS | Status: AC
  Administered 2016-11-16: 6 mg via INTRAVENOUS
  Filled 2016-11-16: qty 2

## 2016-11-16 MED ORDER — ACETAMINOPHEN 325 MG PO TABS
650.0000 mg | ORAL_TABLET | Freq: Four times a day (QID) | ORAL | Status: DC | PRN
  Administered 2016-11-17: 650 mg via ORAL
  Filled 2016-11-16: qty 2

## 2016-11-16 MED ORDER — MOMETASONE FURO-FORMOTEROL FUM 200-5 MCG/ACT IN AERO
2.0000 | INHALATION_SPRAY | Freq: Two times a day (BID) | RESPIRATORY_TRACT | Status: DC
  Administered 2016-11-17 – 2016-11-21 (×8): 2 via RESPIRATORY_TRACT
  Filled 2016-11-16: qty 8.8

## 2016-11-16 MED ORDER — CARVEDILOL 6.25 MG PO TABS
18.7500 mg | ORAL_TABLET | Freq: Two times a day (BID) | ORAL | Status: DC
  Administered 2016-11-17 – 2016-11-21 (×9): 18.75 mg via ORAL
  Filled 2016-11-16 (×9): qty 1

## 2016-11-16 MED ORDER — SODIUM CHLORIDE 0.9 % IV SOLN
3.0000 g | Freq: Four times a day (QID) | INTRAVENOUS | Status: DC
  Administered 2016-11-17 – 2016-11-20 (×13): 3 g via INTRAVENOUS
  Filled 2016-11-16 (×15): qty 3

## 2016-11-16 MED ORDER — ONDANSETRON HCL 4 MG/2ML IJ SOLN
4.0000 mg | Freq: Four times a day (QID) | INTRAMUSCULAR | Status: DC | PRN
  Administered 2016-11-18: 4 mg via INTRAVENOUS

## 2016-11-16 MED ORDER — KETOROLAC TROMETHAMINE 15 MG/ML IJ SOLN
15.0000 mg | Freq: Four times a day (QID) | INTRAMUSCULAR | Status: DC | PRN
Start: 2016-11-16 — End: 2016-11-21
  Administered 2016-11-18 – 2016-11-19 (×2): 15 mg via INTRAVENOUS
  Filled 2016-11-16 (×2): qty 1

## 2016-11-16 MED ORDER — HYDROCODONE-ACETAMINOPHEN 5-325 MG PO TABS
1.0000 | ORAL_TABLET | ORAL | Status: DC | PRN
  Administered 2016-11-17 – 2016-11-18 (×5): 2 via ORAL
  Administered 2016-11-19: 1 via ORAL
  Administered 2016-11-19 – 2016-11-20 (×3): 2 via ORAL
  Administered 2016-11-20: 1 via ORAL
  Administered 2016-11-20 – 2016-11-21 (×4): 2 via ORAL
  Filled 2016-11-16 (×3): qty 2
  Filled 2016-11-16: qty 1
  Filled 2016-11-16 (×10): qty 2

## 2016-11-16 MED ORDER — LISINOPRIL 5 MG PO TABS
5.0000 mg | ORAL_TABLET | Freq: Every day | ORAL | Status: DC
  Administered 2016-11-16 – 2016-11-21 (×6): 5 mg via ORAL
  Filled 2016-11-16 (×6): qty 1

## 2016-11-16 MED ORDER — PREGABALIN 75 MG PO CAPS
75.0000 mg | ORAL_CAPSULE | Freq: Every day | ORAL | Status: DC
Start: 2016-11-16 — End: 2016-11-21
  Administered 2016-11-16 – 2016-11-20 (×5): 75 mg via ORAL
  Filled 2016-11-16 (×5): qty 1

## 2016-11-16 MED ORDER — ONDANSETRON HCL 4 MG PO TABS: 4.0000 mg | ORAL_TABLET | Freq: Four times a day (QID) | ORAL | Status: DC | PRN

## 2016-11-16 MED ORDER — POTASSIUM CHLORIDE CRYS ER 20 MEQ PO TBCR
30.0000 meq | EXTENDED_RELEASE_TABLET | Freq: Once | ORAL | Status: AC
  Administered 2016-11-16: 30 meq via ORAL
  Filled 2016-11-16: qty 1

## 2016-11-16 MED ORDER — FLECAINIDE ACETATE 100 MG PO TABS
100.0000 mg | ORAL_TABLET | Freq: Two times a day (BID) | ORAL | Status: DC
  Administered 2016-11-16 – 2016-11-21 (×9): 100 mg via ORAL
  Filled 2016-11-16 (×10): qty 1

## 2016-11-16 MED ORDER — LORATADINE 10 MG PO TABS
10.0000 mg | ORAL_TABLET | Freq: Every day | ORAL | Status: DC
  Administered 2016-11-17 – 2016-11-21 (×5): 10 mg via ORAL
  Filled 2016-11-16 (×5): qty 1

## 2016-11-16 NOTE — H&P (Signed)
Date: 11/16/2016               Patient Name:  Suzanne Macdonald MRN: [id number]  DOB: [birth year] Age / Sex: 66 y.o., female   PCP: Bartholomew Crews, MD         Medical Service: Internal Medicine Teaching Service         Attending Physician: Dr. Aldine Contes, MD    First Contact: Dr. Gay Filler Pager: [phone]  Second Contact: Dr. Posey Pronto Pager: [phone]       After Hours (After 5p/  First Contact Pager: [phone]  weekends / holidays): Second Contact Pager: [phone]   Chief Complaint: Right abdominal pain and fever.  History of Present Illness: Suzanne Macdonald is a 66 y.o. lady,with PMHx significant for morbid obesity, PAF. 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  Patient was in her usual state of health approximately 4 days ago, when she developed right-sided abdominal pain along with fever and chills(fever up to 102.8), she do endorse some nausea without any vomiting, mild dizziness with walking and night before, frontal headache. She also complained of crampy abdominal pain along with watery diarrhea for last 2-3 days, according to patient she is having 4-6 episodes per day.  She denies any upper respiratory symptoms, chest pain, palpitations, dyspnea, vomiting, change in her appetite or any urinary symptoms.  In ED she was febrile at 101.6, leukocytosis at 11.8, and CT abdomen positive for right lateral anterior abdominal wall abscess with cellulitis.  Meds:  Current Meds  Medication Sig  . acetaminophen (TYLENOL ARTHRITIS PAIN) 650 MG CR tablet Take 650 mg by mouth every 8 (eight) hours as  needed for pain.  Marland Kitchen acetaminophen (TYLENOL) 500 MG tablet Take 1,000 mg by mouth every 6 (six) hours as needed for mild pain or moderate pain.  Marland Kitchen albuterol (PROVENTIL HFA;VENTOLIN HFA) 108 (90 Base) MCG/ACT inhaler Inhale 1-2 puffs into the lungs every 6 (six) hours as needed for wheezing or shortness of breath.  Marland Kitchen apixaban (ELIQUIS) 5 MG TABS tablet Take 1 tablet (5 mg total) by mouth 2 (two) times daily.  Marland Kitchen atorvastatin (LIPITOR) 10 MG tablet take 1 tablet by mouth once daily  . budesonide-formoterol (SYMBICORT) 160-4.5 MCG/ACT inhaler Inhale 2 puffs into the lungs 2 (two) times daily. (Patient taking differently: Inhale 2 puffs into the lungs 2 (two) times daily as needed (for shortness of breath). )  . Calcium-Phosphorus-Vitamin D (CALCIUM/VITAMIN D3/ADULT GUMMY PO) Take 2 each by mouth daily.  . carvedilol (COREG) 6.25 MG tablet Take 3 tablets (18.75 mg total) by mouth 2 (two) times daily with a meal.  . cetirizine (ZYRTEC) 10 MG tablet Take 10 mg by mouth daily.  . Cholecalciferol (VITAMIN D3) 2000 units CHEW Chew 2,000 Units by mouth 2 (two) times daily.  . cyanocobalamin 500 MCG tablet Take 1,000 mcg by mouth daily.   Marland Kitchen diltiazem (CARDIZEM CD) 120 MG 24 hr capsule Take 1 capsule (120 mg total) by mouth daily.  Marland Kitchen diltiazem (CARDIZEM) 30 MG tablet Take 1 tablet every 4 hours AS NEEDED for AFIB fast heart rate >100 as long as blood pressure >100.  Marland Kitchen docusate sodium (COLACE) 100 MG capsule Take 1  capsule (100 mg total) by mouth 2 (two) times daily as needed for mild constipation.  . ferrous sulfate 325 (65 FE) MG tablet Take 1 tablet (325 mg total) by mouth daily with breakfast.  . flecainide (TAMBOCOR) 100 MG tablet take 1 tablet by mouth twice a day  . lisinopril (PRINIVIL,ZESTRIL) 5 MG tablet take 1 tablet by mouth once daily  . loperamide (IMODIUM) 2 MG capsule Take 2 mg by mouth daily as needed for diarrhea or loose stools.  . Multiple Vitamins-Minerals (ALIVE WOMENS GUMMY) CHEW Chew 2  each by mouth daily.  Marland Kitchen omeprazole (PRILOSEC) 40 MG capsule Take 1 capsule (40 mg total) by mouth daily.  . potassium chloride (K-DUR) 10 MEQ tablet 2 TABLETS BY MOUTH DAILY (Patient taking differently: Take 20 mEq by mouth daily. )  . pregabalin (LYRICA) 75 MG capsule Take 1 capsule (75 mg total) by mouth at bedtime. Take 1 to 3 hours before bedtime  . traZODone (DESYREL) 100 MG tablet Take 1 tablet (100 mg total) by mouth at bedtime.     Allergies: Allergies as of 11/16/2016 - Review Complete 11/16/2016  Allergen Reaction Noted  . Augmentin [amoxicillin-pot clavulanate] Diarrhea 07/11/2016  . Barium-containing compounds Other (See Comments) 07/30/2016  . Scallops [shellfish allergy] Hives 06/09/2016  . Vancomycin Other (See Comments) 08/11/2016   Past Medical History:  Diagnosis Date  . Abdominal wall abscess 09/24/2016  . Arthritis    "throughout my body" (08/23/2016)  . Asthma   . Atrial fibrillation ([place])   . Cervical cancer ([place]) 1972  . Chronic lower back pain   . Colon cancer ([place]) 2015  . GERD (gastroesophageal reflux disease)   . H/O gastric bypass   . History of blood transfusion 1972; 1990s   "both w/OR"  . Hyperlipidemia   . Hypertension   . Insomnia   . Morbid obesity ([place]) 03/04/2016  . OSA (obstructive sleep apnea)    "can't afford the machine" (08/23/2016)  . Pneumonia    "probably 4 times in early childhood" (08/23/2016)    Family History: Mother: Deceased, MI. Also had history of DM Father: Deceased, CVA. Also had history of DM and CAD.  Two sisters with thyroid disease and diabetes.  Two (deceased) brothers with heart attacks.   Social History:  Former smoker with a 45 pack-year history, quit in 2000's. Denied any recent alcohol use and at most drank a beer occasionally. Denied any drug use. Lives at home with her husband.  Review of Systems: A complete ROS was negative except as per HPI.   Physical Exam: Blood pressure (!) 131/56, pulse 70,  temperature 99.4 F (37.4 C), temperature source Oral, resp. rate 19, SpO2 100 %. Vitals:   11/16/16 1830 11/16/16 1930 11/16/16 2015 11/16/16 2230  BP: 114/72 (!) 159/67 (!) 131/56 128/73  Pulse: 65 66 70 87  Resp: (!) 23 (!) 23 19 (!) 24  Temp:      TempSrc:      SpO2: 99% 100% 100% 97%   General: Vital signs reviewed.  Patient is well-developed and well-nourished, in no acute distress and cooperative with exam.  Head: Normocephalic and atraumatic. Eyes: EOMI, conjunctivae normal, no scleral icterus.  Neck: Supple, trachea midline, normal ROM, no JVD, masses, thyromegaly, or carotid bruit present.  Cardiovascular: RRR, S1 normal, S2 normal, no murmurs, gallops, or rubs. Pulmonary/Chest: Clear to auscultation bilaterally, no wheezes, rales, or rhonchi. Abdominal: Soft, Diffuse tenderness more pronounced on right lateral wall, with 3 X 0.5 cm granulation tissue, with  erythema underneath, non-distended, BS +, no masses, organomegaly, or guarding present.  Extremities: No lower extremity edema bilaterally,  pulses symmetric and intact bilaterally. No cyanosis or clubbing. Neurological: A&O x3, Strength is normal and symmetric bilaterally, cranial nerve II-XII are grossly intact, no focal motor deficit, sensory intact to light touch bilaterally.   Labs. CBC    Component Value Date/Time   WBC 11.8 (H) 11/16/2016 1347   RBC 4.76 11/16/2016 1347   HGB 10.7 (L) 11/16/2016 1347   HCT 35.2 (L) 11/16/2016 1347   HCT 37.3 01/24/2016 0952   PLT 190 11/16/2016 1347   PLT 279 01/24/2016 0952   MCV 73.9 (L) 11/16/2016 1347   MCV 77 (L) 01/24/2016 0952   MCH 22.5 (L) 11/16/2016 1347   MCHC 30.4 11/16/2016 1347   RDW 22.7 (H) 11/16/2016 1347   RDW 18.4 (H) 01/24/2016 0952   LYMPHSABS 1.3 11/16/2016 1347   MONOABS 1.2 (H) 11/16/2016 1347   EOSABS 0.1 11/16/2016 1347   BASOSABS 0.0 11/16/2016 1347   CMP Latest Ref Rng & Units 11/16/2016 11/14/2016 09/29/2016  Glucose 65 - 99 mg/dL 145(H) 152(H)  99  BUN 6 - 20 mg/dL '9 15 7  '$ Creatinine 0.44 - 1.00 mg/dL 0.88 0.86 0.80  Sodium 135 - 145 mmol/L 137 134(L) 140  Potassium 3.5 - 5.1 mmol/L 3.3(L) 3.5 3.3(L)  Chloride 101 - 111 mmol/L 105 102 107  CO2 22 - 32 mmol/L '23 22 22  '$ Calcium 8.9 - 10.3 mg/dL 8.3(L) 8.6(L) 8.8(L)  Total Protein 6.5 - 8.1 g/dL 6.1(L) 6.2(L) 6.1(L)  Total Bilirubin 0.3 - 1.2 mg/dL 0.9 0.3 0.7  Alkaline Phos 38 - 126 U/L 73 102 67  AST 15 - 41 U/L '19 26 23  '$ ALT 14 - 54 U/L '21 24 22   '$ Urinalysis    Component Value Date/Time   COLORURINE YELLOW 11/16/2016 1812   APPEARANCEUR CLEAR 11/16/2016 1812   LABSPEC 1.035 (H) 11/16/2016 1812   PHURINE 5.0 11/16/2016 1812   GLUCOSEU NEGATIVE 11/16/2016 1812   HGBUR NEGATIVE 11/16/2016 1812   BILIRUBINUR NEGATIVE 11/16/2016 1812   KETONESUR NEGATIVE 11/16/2016 1812   PROTEINUR NEGATIVE 11/16/2016 1812   NITRITE NEGATIVE 11/16/2016 1812   LEUKOCYTESUR NEGATIVE 11/16/2016 1812   Lactic acid.1.05>>0.85 PT/INR.17.5/1.43 Blood culture. Pending  EKG: Normal sinus rhythm, no acute change.  CXR: FINDINGS: Right-sided chest wall port is again seen and stable. Cardiac shadow is within normal limits. Lungs are well aerated bilaterally. Prior granulomatous disease is again identified. No focal infiltrate or sizable effusion is seen. No acute bony abnormality is noted.  IMPRESSION: No active cardiopulmonary disease.  CT abdomen and pelvis with contrast. FINDINGS: Lower chest: No acute abnormality.  Hepatobiliary: No focal liver abnormality is seen. Low attenuation of the liver as can be seen with hepatic steatosis. No gallstones, gallbladder wall thickening, or biliary dilatation.  Pancreas: Unremarkable. No pancreatic ductal dilatation or surrounding inflammatory changes.  Spleen: Normal in size without focal abnormality.  Adrenals/Urinary Tract: Adrenal glands are unremarkable. Kidneys are normal, without renal calculi, focal lesion, or hydronephrosis. Bladder  is unremarkable.  Stomach/Bowel: Prior gastric bypass surgery. No bowel dilatation or bowel wall thickening. No pneumatosis, pneumoperitoneum or portal venous gas. Normal appendix.  Vascular/Lymphatic: Abdominal aortic atherosclerosis. Normal caliber abdominal aorta. No lymphadenopathy.  Reproductive: Status post hysterectomy. No adnexal masses.  Other: 4.3 x 7.8 cm complex fluid collection in the right lateral anterior abdominal wall with surrounding inflammatory changes consistent with an abscess.  Musculoskeletal: No acute or significant osseous  findings. Mild degenerative changes of bilateral sacroiliac joints. Bilateral facet arthropathy at L3-4, L4-5 and L5-S1.  IMPRESSION: 1. 4.3 x 7.8 cm right lateral anterior abdominal wall abscess with surrounding cellulitis.    Assessment & Plan by Problem:  Suzanne Macdonald is a 66 y.o. lady,with PMHx significant for morbid obesity, PAF. 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  Right abdominal wall cellulitis and abscess. She has a history of re-current right abdominal wall cellulitis along with abscess formation requiring incision and drainage and antibiotics since her surgery for large ventral hernia repair in September 2017. Last episode was in February 2018. She grew Klebsiella pneumonia on her previous culture and enterococcus prior to that. During current admission she was febrile log with leukocytosis and her CT abdomen was positive for right lateral anterior abdominal wall abscess with cellulitis. She was given a dose of cefazolin in ED. Gen.  surgery was consulted-planning to do incision and drainage on Sunday after Eliquis washout. -Admit to MedSurg. -Pre-albumin -Unasyn 3 g IV every 6 hourly. -Norco for pain, as needed. -Toradol 15 mg IV if needed for severe pain -Zofran for nausea or vomiting as needed  Diarrhea. She was complaining of having diarrhea with crampy abdominal pain, having 4-6 loose watery bowel movements every day for last 2-3 days. She is high risk for C. difficile because of her previous antibiotics use. -Check for C. Difficile.  Hypokalemia. Found to have potassium of 3.3. -Replace with K Dur 30 mEq once. -Repeat BMP in the morning.  Paroxysmal A. Fib.Anticoagulated with Eliquis 5 mg BID. Also on Flecainide 100 mg BID, Coreg 12.5 mg, and with Cardizem 120 mg daily. Also as PRN Cardizem 30 mg for tachycardia. Have held Eliquis in setting of upcoming I&D -Will need to ensure resumption of Eliquis at d/c -Continued rate control as above.  Hypertension. Well controlled on home dose of lisinopril 5 mg daily which has been continued.  Asthma. No current exacerbation. Her symptoms are well controlled on Symbicort and albuterol PRN which have been continued.   Hyperlipidemia.Most recent lipid panel 01/2016 showed a total cholesterol of 227, HDL 50 and LDL 151. - Continued Lipitor 10 mg  DVT prophylaxis. Heparin CODE STATUS. Full Diet. Heart healthy.   Dispo: Admit patient to Inpatient with expected length of stay greater than 2 midnights.  Signed: Lorella Nimrod, MD 11/16/2016, 10:16 PM  Pager: [phone]

## 2016-11-16 NOTE — Consult Note (Addendum)
Surgical Consultation Requesting provider: Dr. Oleta Mouse  CC: Fever  HPI: Well known to our service with recurrent right lower abdominal wall abscess, last I&D on 09/24/16. She states it has felt painful since that surgery. Started having fevers 4 days ago. Did not contact our office. Took tylenol, no improvement. Decided to come to ER at noon today. No drainage from the wound site. Last eliquis was last night.   Allergies  Allergen Reactions  . Augmentin [Amoxicillin-Pot Clavulanate] Diarrhea  . Barium-Containing Compounds Other (See Comments)    Feels like cement in the body  . Scallops [Shellfish Allergy] Hives  . Vancomycin Other (See Comments)    Pt began coughing and c/o tightening of chest/difficulty breathing and flushing during Vanc infusion.  Reports she never wants to have Vancomycin again even if it can be prevented with rate reduction    Past Medical History:  Diagnosis Date  . Abdominal wall abscess 09/24/2016  . Arthritis    "throughout my body" (08/23/2016)  . Asthma   . Atrial fibrillation ([place])   . Cervical cancer ([place]) 1972  . Chronic lower back pain   . Colon cancer ([place]) 2015  . GERD (gastroesophageal reflux disease)   . H/O gastric bypass   . History of blood transfusion 1972; 1990s   "both w/OR"  . Hyperlipidemia   . Hypertension   . Insomnia   . Morbid obesity ([place]) 03/04/2016  . OSA (obstructive sleep apnea)    "can't afford the machine" (08/23/2016)  . Pneumonia    "probably 4 times in early childhood" (08/23/2016)    Past Surgical History:  Procedure Laterality Date  . ABDOMINAL HERNIA REPAIR  1990s  . BOWEL RESECTION  2015   [place], [place] by Dr. Stormy Fabian  . CARDIAC CATHETERIZATION  05/2014   in [place], no sig CAD (done prior to surgery for colon CA)  . [place]   "twins"  . COLON SURGERY    . DILATION AND CURETTAGE OF UTERUS  1960s X 1  . GASTRIC BYPASS  1992  . HERNIA REPAIR    . INSERTION OF MESH N/A 05/01/2016   Procedure:  INSERTION OF MESH;  Surgeon: Excell Seltzer, MD;  Location: [facility];  Service: General;  Laterality: N/A;  . IR GENERIC HISTORICAL  06/28/2016   IR RADIOLOGIST EVAL & MGMT 06/28/2016 Markus Daft, MD [facility] INTERV RAD  . IR GENERIC HISTORICAL  08/07/2016   IR RADIOLOGIST EVAL & MGMT 08/07/2016 [facility] INTERV RAD  . LAPAROSCOPIC GASTRIC BANDING     Placed 2013 and removed in 2014.  Marland Kitchen TOTAL ABDOMINAL HYSTERECTOMY  1998   For tumorous growth.  Marland Kitchen VACUUM ASSISTED CLOSURE CHANGE N/A 09/24/2016   Procedure: Incision and Drainage Abdominal Wall Abscess;  Surgeon: Stark Klein, MD;  Location: [facility];  Service: General;  Laterality: N/A;  . VENTRAL HERNIA REPAIR N/A 05/01/2016   Procedure: VENTRAL HERNIA REPAIR;  Surgeon: Excell Seltzer, MD;  Location: [facility];  Service: General;  Laterality: N/A;  . VENTRAL HERNIA REPAIR N/A 08/24/2016   Procedure: IRRIGATION AND DEBRIDEMENT OF ABDOMINAL WALL ABCESS;  Surgeon: Stark Klein, MD;  [facility];  Service: General;  Laterality: N/A;    Family History  Problem Relation Age of Onset  . Heart disease Mother   . Diabetes Mother   . Heart disease Father   . Stroke Father   . Diabetes Father   . Diabetes Sister   . Thyroid disease Sister   . Early death Brother  Killed by drunk driver  . Heart attack Brother   . Thyroid disease Daughter     s/p thyroidectomy  . Diabetes Sister   . Thyroid disease Sister   . Stroke Brother   . Drug abuse Brother   . Heart attack Brother     Social History   Social History  . Marital status: Married    Spouse name: N/A  . Number of children: N/A  . Years of education: N/A   Social History Main Topics  . Smoking status: Former Smoker    Packs/day: 1.00    Years: 45.00    Types: Cigarettes  . Smokeless tobacco: Never Used     Comment: "quit smoking mid 2000s"  . Alcohol use 0.0 oz/week     Comment: 08/23/2016 "a beer every now and then; none in over 1 year"  . Drug use: No  . Sexual activity: No   Other  Topics Concern  . None   Social History Narrative  . None    No current facility-administered medications on file prior to encounter.    Current Outpatient Prescriptions on File Prior to Encounter  Medication Sig Dispense Refill  . acetaminophen (TYLENOL) 500 MG tablet Take 1,000 mg by mouth every 6 (six) hours as needed for mild pain or moderate pain.    Marland Kitchen albuterol (PROVENTIL HFA;VENTOLIN HFA) 108 (90 Base) MCG/ACT inhaler Inhale 1-2 puffs into the lungs every 6 (six) hours as needed for wheezing or shortness of breath.    Marland Kitchen apixaban (ELIQUIS) 5 MG TABS tablet Take 1 tablet (5 mg total) by mouth 2 (two) times daily. 180 tablet 3  . atorvastatin (LIPITOR) 10 MG tablet take 1 tablet by mouth once daily 90 tablet 3  . budesonide-formoterol (SYMBICORT) 160-4.5 MCG/ACT inhaler Inhale 2 puffs into the lungs 2 (two) times daily. (Patient taking differently: Inhale 2 puffs into the lungs 2 (two) times daily as needed (for shortness of breath). ) 1 Inhaler 1  . Calcium-Phosphorus-Vitamin D (CALCIUM/VITAMIN D3/ADULT GUMMY PO) Take 2 each by mouth daily.    . carvedilol (COREG) 6.25 MG tablet Take 3 tablets (18.75 mg total) by mouth 2 (two) times daily with a meal. 270 tablet 2  . Cholecalciferol (VITAMIN D3) 2000 units CHEW Chew 2,000 Units by mouth 2 (two) times daily.    . cyanocobalamin 500 MCG tablet Take 1,000 mcg by mouth daily.     Marland Kitchen diltiazem (CARDIZEM CD) 120 MG 24 hr capsule Take 1 capsule (120 mg total) by mouth daily. 90 capsule 1  . diltiazem (CARDIZEM) 30 MG tablet Take 1 tablet every 4 hours AS NEEDED for AFIB fast heart rate >100 as long as blood pressure >100. 45 tablet 3  . docusate sodium (COLACE) 100 MG capsule Take 1 capsule (100 mg total) by mouth 2 (two) times daily as needed for mild constipation. 10 capsule 0  . ferrous sulfate 325 (65 FE) MG tablet Take 1 tablet (325 mg total) by mouth daily with breakfast. 30 tablet 0  . flecainide (TAMBOCOR) 100 MG tablet take 1 tablet by  mouth twice a day 60 tablet 5  . lisinopril (PRINIVIL,ZESTRIL) 5 MG tablet take 1 tablet by mouth once daily 30 tablet 11  . loperamide (IMODIUM) 2 MG capsule Take 2 mg by mouth daily as needed for diarrhea or loose stools.    . Multiple Vitamins-Minerals (ALIVE WOMENS GUMMY) CHEW Chew 2 each by mouth daily.    Marland Kitchen omeprazole (PRILOSEC) 40 MG capsule Take 1 capsule (  40 mg total) by mouth daily. 90 capsule 1  . potassium chloride (K-DUR) 10 MEQ tablet 2 TABLETS BY MOUTH DAILY (Patient taking differently: Take 20 mEq by mouth daily. ) 60 tablet 5  . pregabalin (LYRICA) 75 MG capsule Take 1 capsule (75 mg total) by mouth at bedtime. Take 1 to 3 hours before bedtime 90 capsule 0  . traZODone (DESYREL) 100 MG tablet Take 1 tablet (100 mg total) by mouth at bedtime. 90 tablet 1    Review of Systems: a complete, 10pt review of systems was completed with pertinent positives and negatives as documented in the HPI.   Physical Exam: Vitals:   11/16/16 1655 11/16/16 1813  BP: (!) 165/71 (!) 151/101  Pulse: 65 70  Resp: 15 18  Temp: (!) 101.6 F (38.7 C) 99.4 F (37.4 C)   Gen: A&Ox3, no distress  Head: normocephalic, atraumatic, EOMI, anicteric.  Neck: supple without mass or thyromegaly Chest: unlabored respirations   Cardiovascular: no tachycardia Abdomen: obese, no palpable mass or organomegaly, see skin below Extremities: warm, without edema, no deformities  Neuro: grossly intact Psych: appropriate mood and affect, insight ok Skin: RLQ abdominal wall with 3x0.5cm granulation bud, inferior to this is a 15x5cm swath of erythema. The soft tissue surrounding this area is tender.   CBC Latest Ref Rng & Units 11/16/2016 11/14/2016 09/29/2016  WBC 4.0 - 10.5 K/uL 11.8(H) 10.8(H) 9.9  Hemoglobin 12.0 - 15.0 g/dL 10.7(L) 11.4(L) 8.2(L)  Hematocrit 36.0 - 46.0 % 35.2(L) 36.3 27.3(L)  Platelets 150 - 400 K/uL 190 191 271    CMP Latest Ref Rng & Units 11/16/2016 11/14/2016 09/29/2016  Glucose 65 - 99  mg/dL 145(H) 152(H) 99  BUN 6 - 20 mg/dL '9 15 7  '$ Creatinine 0.44 - 1.00 mg/dL 0.88 0.86 0.80  Sodium 135 - 145 mmol/L 137 134(L) 140  Potassium 3.5 - 5.1 mmol/L 3.3(L) 3.5 3.3(L)  Chloride 101 - 111 mmol/L 105 102 107  CO2 22 - 32 mmol/L '23 22 22  '$ Calcium 8.9 - 10.3 mg/dL 8.3(L) 8.6(L) 8.8(L)  Total Protein 6.5 - 8.1 g/dL 6.1(L) 6.2(L) 6.1(L)  Total Bilirubin 0.3 - 1.2 mg/dL 0.9 0.3 0.7  Alkaline Phos 38 - 126 U/L 73 102 67  AST 15 - 41 U/L '19 26 23  '$ ALT 14 - 54 U/L '21 24 22    '$ Lab Results  Component Value Date   INR 1.43 11/16/2016   INR 1.15 08/11/2016   INR 1.23 07/12/2016    Imaging: CT shows recurrent R abdominal wall abscess  A/P: 65yo woman with recurrent right abdominal wall abscess. I offered aspiration this evening to relieve some pain with plans for definitive I&D again either tomorrow or Sunday once her eliquis has worn off, but she declined and said she wants to see Dr. Excell Seltzer or Dr. Barry Dienes. I explained that since it is evening and weekend, they are not on call and either I or Dr. Redmond Pulling would do the procedure if she consents for this weekend. Initially she stated she would just go home and come back Monday, but again I explained that coming to the ER does not guarantee availability of Surgeon preference and that I recommend against going home and forgoing IV antibiotic therapy/ surgery. She would like to forego aspiration and wait until Sunday to have definitive I&D.  -IV abx -Appreciate medical team caring for her multiple comorbidities -hold anticoagulation -plan for I&D on Sunday, although percutaneous drain placement may be an option as well  Romana Juniper, MD [facility] Surgery, [place] Pager [phone]

## 2016-11-16 NOTE — ED Notes (Signed)
Patient to CT.

## 2016-11-16 NOTE — ED Provider Notes (Signed)
[facility] DEPT Provider Note   CSN: [id number] Arrival date & time: 11/16/16  1318     History   Chief Complaint Chief Complaint  Patient presents with  . Fever    HPI Suzanne Macdonald is a 66 y.o. female.  HPI Suzanne Macdonald is a 66 y.o. female with history of A. fib, colon cancer, gastric bypass, recurrent intra-abdominal abscesses, presents to emergency department complaining of increased pain to the right lower abdominal wall, fever up to 102 at home. States that she had last incision and drainage of the abscess performed one month and a half ago. States that she continued to have some pain and drainage but it improved. States about 3 days ago she has developed fever, increased redness around the abscess site, and the drainage has changed. She reports associated nausea, headache, myalgias. States feels like an abscess is back. No other complaitns. No treatment at home. Not on any antibiotics at this time.   Past Medical History:  Diagnosis Date  . Abdominal wall abscess 09/24/2016  . Arthritis    "throughout my body" (08/23/2016)  . Asthma   . Atrial fibrillation ([place])   . Cervical cancer ([place]) 1972  . Chronic lower back pain   . Colon cancer ([place]) 2015  . GERD (gastroesophageal reflux disease)   . H/O gastric bypass   . History of blood transfusion 1972; 1990s   "both w/OR"  . Hyperlipidemia   . Hypertension   . Insomnia   . Morbid obesity ([place]) 03/04/2016  . OSA (obstructive sleep apnea)    "can't afford the machine" (08/23/2016)  . Pneumonia    "probably 4 times in early childhood" (08/23/2016)    Patient Active Problem List   Diagnosis Date Noted  . Protein-calorie malnutrition ([place]) 10/11/2016  . Ingrown right big toenail 10/11/2016  . Restless leg syndrome 10/11/2016  . Insomnia 10/11/2016  . GERD (gastroesophageal reflux disease) 10/11/2016  . Iron deficiency anemia 08/24/2016  . Atrial flutter ([place]) 07/12/2016  . Abdominal wall abscess 06/10/2016  .  Aortic atherosclerosis ([place]) 05/09/2016  . Morbid obesity ([place]) 05/04/2016  . Incarcerated hernia 04/28/2016  . Osteopenia 03/15/2016  . Asthma 03/02/2016  . Essential hypertension 01/25/2016  . Vitamin D deficiency 01/25/2016  . 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 Vitamin B12 deficiency 01/25/2016  . Paroxysmal atrial fibrillation ([place]) 01/25/2016  . Health care maintenance 01/25/2016  . History of colon cancer 01/25/2016  . Ventral hernia 01/25/2016  . History of cervical cancer 01/25/2016    Past Surgical History:  Procedure Laterality Date  . ABDOMINAL HERNIA REPAIR  1990s  . BOWEL RESECTION  2015   [place], [place] by Dr. Stormy Fabian  . CARDIAC CATHETERIZATION  05/2014   in [place], no sig CAD (done prior to surgery for colon CA)  . [place]   "twins"  . COLON SURGERY    . DILATION AND CURETTAGE OF UTERUS  1960s X 1  . GASTRIC BYPASS  1992  . HERNIA REPAIR    . INSERTION OF MESH N/A 05/01/2016   Procedure: INSERTION OF MESH;  Surgeon: Excell Seltzer, MD;  Location: [facility];  Service: General;  Laterality: N/A;  . IR GENERIC HISTORICAL  06/28/2016   IR RADIOLOGIST EVAL & MGMT 06/28/2016 Markus Daft, MD [facility] INTERV RAD  . IR GENERIC HISTORICAL  08/07/2016   IR RADIOLOGIST EVAL & MGMT 08/07/2016 [facility] INTERV RAD  . LAPAROSCOPIC GASTRIC BANDING     Placed 2013 and removed in 2014.  Marland Kitchen  TOTAL ABDOMINAL HYSTERECTOMY  1998   For tumorous growth.  Marland Kitchen VACUUM ASSISTED CLOSURE CHANGE N/A 09/24/2016   Procedure: Incision and Drainage Abdominal Wall Abscess;  Surgeon: Stark Klein, MD;  Location: [facility];  Service: General;  Laterality: N/A;  . VENTRAL HERNIA REPAIR N/A 05/01/2016   Procedure: VENTRAL HERNIA REPAIR;  Surgeon: Excell Seltzer, MD;  Location: [facility];  Service: General;  Laterality: N/A;  . VENTRAL HERNIA REPAIR N/A 08/24/2016   Procedure: IRRIGATION AND DEBRIDEMENT OF ABDOMINAL WALL ABCESS;  Surgeon: Stark Klein, MD;  Location: [facility];  Service: General;   Laterality: N/A;    OB History    No data available       Home Medications    Prior to Admission medications   Medication Sig Start Date End Date Taking? Authorizing Provider  acetaminophen (TYLENOL) 500 MG tablet Take 1,000 mg by mouth every 6 (six) hours as needed for mild pain or moderate pain.    Historical Provider, MD  albuterol (PROVENTIL HFA;VENTOLIN HFA) 108 (90 Base) MCG/ACT inhaler Inhale 1-2 puffs into the lungs every 6 (six) hours as needed for wheezing or shortness of breath.    Historical Provider, MD  apixaban (ELIQUIS) 5 MG TABS tablet Take 1 tablet (5 mg total) by mouth 2 (two) times daily. 10/01/16   Bartholomew Crews, MD  atorvastatin (LIPITOR) 10 MG tablet take 1 tablet by mouth once daily 10/05/16   Bartholomew Crews, MD  budesonide-formoterol [facility]) 160-4.5 MCG/ACT inhaler Inhale 2 puffs into the lungs 2 (two) times daily. Patient taking differently: Inhale 2 puffs into the lungs 2 (two) times daily as needed (for shortness of breath).  01/24/16   Milagros Loll, MD  Calcium-Phosphorus-Vitamin D (CALCIUM/VITAMIN D3/ADULT GUMMY PO) Take 2 each by mouth daily.    Historical Provider, MD  carvedilol (COREG) 6.25 MG tablet Take 3 tablets (18.75 mg total) by mouth 2 (two) times daily with a meal. 10/19/16   [name], MD  Cholecalciferol (VITAMIN D3) 2000 units CHEW Chew 2,000 Units by mouth 2 (two) times daily.    Historical Provider, MD  cyanocobalamin 500 MCG tablet Take 1,000 mcg by mouth daily.     Historical Provider, MD  diltiazem (CARDIZEM CD) 120 MG 24 hr capsule Take 1 capsule (120 mg total) by mouth daily. 07/13/16   Alphonzo Grieve, MD  diltiazem (CARDIZEM) 30 MG tablet Take 1 tablet every 4 hours AS NEEDED for AFIB fast heart rate >100 as long as blood pressure >100. 07/30/16   Sherran Needs, NP  docusate sodium (COLACE) 100 MG capsule Take 1 capsule (100 mg total) by mouth 2 (two) times daily as needed for mild constipation. 06/14/16   Ledell Noss,  MD  ferrous sulfate 325 (65 FE) MG tablet Take 1 tablet (325 mg total) by mouth daily with breakfast. 09/26/16   Kalman Drape, PA  flecainide (TAMBOCOR) 100 MG tablet take 1 tablet by mouth twice a day 10/29/16   [name], MD  lisinopril (PRINIVIL,ZESTRIL) 5 MG tablet take 1 tablet by mouth once daily 07/27/16   [name], MD  loperamide (IMODIUM) 2 MG capsule Take 2 mg by mouth daily as needed for diarrhea or loose stools.    Historical Provider, MD  Multiple Vitamins-Minerals (ALIVE WOMENS GUMMY) CHEW Chew 2 each by mouth daily.    Historical Provider, MD  omeprazole (PRILOSEC) 40 MG capsule Take 1 capsule (40 mg total) by mouth daily. 04/27/16   [name], MD  potassium chloride (K-DUR)  10 MEQ tablet 2 TABLETS BY MOUTH DAILY Patient taking differently: Take 20 mEq by mouth daily.  06/27/16   [name], MD  pregabalin (LYRICA) 75 MG capsule Take 1 capsule (75 mg total) by mouth at bedtime. Take 1 to 3 hours before bedtime 10/11/16   Bartholomew Crews, MD  traZODone (DESYREL) 100 MG tablet Take 1 tablet (100 mg total) by mouth at bedtime. 10/11/16   Bartholomew Crews, MD    Family History Family History  Problem Relation Age of Onset  . Heart disease Mother   . Diabetes Mother   . Heart disease Father   . Stroke Father   . Diabetes Father   . Diabetes Sister   . Thyroid disease Sister   . Early death Brother     Killed by drunk driver  . Heart attack Brother   . Thyroid disease Daughter     s/p thyroidectomy  . Diabetes Sister   . Thyroid disease Sister   . Stroke Brother   . Drug abuse Brother   . Heart attack Brother     Social History Social History  Substance Use Topics  . Smoking status: Former Smoker    Packs/day: 1.00    Years: 45.00    Types: Cigarettes  . Smokeless tobacco: Never Used     Comment: "quit smoking mid 2000s"  . Alcohol use 0.0 oz/week     Comment: 08/23/2016 "a beer every now and then; none in over 1 year"      Allergies   Augmentin [amoxicillin-pot clavulanate]; Barium-containing compounds; Scallops [shellfish allergy]; and Vancomycin   Review of Systems Review of Systems  Constitutional: Positive for chills and fever.  Respiratory: Negative for cough, chest tightness and shortness of breath.   Cardiovascular: Negative for chest pain, palpitations and leg swelling.  Gastrointestinal: Positive for abdominal pain and nausea. Negative for diarrhea and vomiting.  Genitourinary: Negative for dysuria, flank pain and pelvic pain.  Musculoskeletal: Positive for myalgias. Negative for arthralgias, neck pain and neck stiffness.  Skin: Negative for rash.  Neurological: Positive for weakness. Negative for dizziness and headaches.  All other systems reviewed and are negative.    Physical Exam Updated Vital Signs BP (!) 124/54   Pulse 63   Temp 98.5 F (36.9 C) (Oral)   Resp 20   SpO2 96%   Physical Exam  Constitutional: She appears well-developed and well-nourished. No distress.  HENT:  Head: Normocephalic.  Eyes: Conjunctivae are normal.  Neck: Neck supple.  Cardiovascular: Normal rate, regular rhythm and normal heart sounds.   Pulmonary/Chest: Effort normal and breath sounds normal. No respiratory distress. She has no wheezes. She has no rales.  Abdominal: Soft. Bowel sounds are normal. She exhibits no distension. There is tenderness. There is no rebound.  Healed surgical incision in RLQ, with surrounding erythema, warmth to the touch. Palpable mass/abscess in abdominal wall around the incision. TTP.   Musculoskeletal: She exhibits no edema.  Neurological: She is alert.  Skin: Skin is warm and dry.  Psychiatric: She has a normal mood and affect. Her behavior is normal.  Nursing note and vitals reviewed.    ED Treatments / Results  Labs (all labs ordered are listed, but only abnormal results are displayed) Labs Reviewed  COMPREHENSIVE METABOLIC PANEL - Abnormal; Notable for the  following:       Result Value   Potassium 3.3 (*)    Glucose, Bld 145 (*)    Calcium 8.3 (*)    Total Protein  6.1 (*)    Albumin 2.9 (*)    All other components within normal limits  CBC WITH DIFFERENTIAL/PLATELET - Abnormal; Notable for the following:    WBC 11.8 (*)    Hemoglobin 10.7 (*)    HCT 35.2 (*)    MCV 73.9 (*)    MCH 22.5 (*)    RDW 22.7 (*)    Neutro Abs 9.2 (*)    Monocytes Absolute 1.2 (*)    All other components within normal limits  PROTIME-INR - Abnormal; Notable for the following:    Prothrombin Time 17.5 (*)    All other components within normal limits  CULTURE, BLOOD (ROUTINE X 2)  CULTURE, BLOOD (ROUTINE X 2)  URINALYSIS, ROUTINE W REFLEX MICROSCOPIC  I-STAT CG4 LACTIC ACID, ED  I-STAT CG4 LACTIC ACID, ED    EKG  EKG Interpretation None       Radiology Dg Chest 2 View  Result Date: 11/16/2016 CLINICAL DATA:  Fevers EXAM: CHEST  2 VIEW COMPARISON:  09/29/2016 FINDINGS: Right-sided chest wall port is again seen and stable. Cardiac shadow is within normal limits. Lungs are well aerated bilaterally. Prior granulomatous disease is again identified. No focal infiltrate or sizable effusion is seen. No acute bony abnormality is noted. IMPRESSION: No active cardiopulmonary disease. Electronically Signed   By: Inez Catalina M.D.   On: 11/16/2016 14:26    Procedures Procedures (including critical care time)  Medications Ordered in ED Medications  ondansetron (ZOFRAN-ODT) disintegrating tablet 8 mg (not administered)  morphine 4 MG/ML injection 6 mg (not administered)     Initial Impression / Assessment and Plan / ED Course  I have reviewed the triage vital signs and the nursing notes.  Pertinent labs & imaging results that were available during my care of the patient were reviewed by me and considered in my medical decision making (see chart for details).     Patient with recurrent cellulitis and intra-abdominal abscess. She is febrile at 101. Will  check labs and get CT abdomen and pelvis for reevaluation.  White blood cell count is 11.8. CT showing recurrent abscess. I discussed the general surgery, they will come by and see patient. She is on Eliquis. Pt is penicillin and vanc allergic. Reviewed recent culture, sensitive to cephalosporins. Will start on Ancef.   Spoke with internal medicine, will admit.   Vitals:   11/16/16 1930 11/16/16 2015 11/16/16 2230 11/16/16 2317  BP: (!) 159/67 (!) 131/56 128/73 117/60  Pulse: 66 70 87 74  Resp: (!) 23 19 (!) 24   Temp:    98.9 F (37.2 C)  TempSrc:    Oral  SpO2: 100% 100% 97% 98%  Weight:    117 kg  Height:    '5\' 2"'$  (1.575 m)     Final Clinical Impressions(s) / ED Diagnoses   Final diagnoses:  Abdominal wall abscess    New Prescriptions Current Discharge Medication List       Jeannett Senior, PA-C 11/17/16 0037    Forde Dandy, MD 11/17/16 1158

## 2016-11-16 NOTE — ED Triage Notes (Signed)
PResents with fever of 102.8 at home for the past week, pt has open wound from abscess drainage on the right side of her abdomen, redness around site, endorses purulent drainage and severe pain at site. Pain is described as stabbing. Also reports nausea and weakness and fatigue. Last dose of tylenol at noon today for fever. Alert, oriented answers all questions appropriately.

## 2016-11-17 DIAGNOSIS — R110 Nausea: Secondary | ICD-10-CM

## 2016-11-17 DIAGNOSIS — B9689 Other specified bacterial agents as the cause of diseases classified elsewhere: Secondary | ICD-10-CM

## 2016-11-17 DIAGNOSIS — L02211 Cutaneous abscess of abdominal wall: Principal | ICD-10-CM

## 2016-11-17 DIAGNOSIS — R197 Diarrhea, unspecified: Secondary | ICD-10-CM

## 2016-11-17 DIAGNOSIS — Z7901 Long term (current) use of anticoagulants: Secondary | ICD-10-CM

## 2016-11-17 DIAGNOSIS — L03311 Cellulitis of abdominal wall: Secondary | ICD-10-CM

## 2016-11-17 DIAGNOSIS — Z9884 Bariatric surgery status: Secondary | ICD-10-CM

## 2016-11-17 DIAGNOSIS — I480 Paroxysmal atrial fibrillation: Secondary | ICD-10-CM

## 2016-11-17 DIAGNOSIS — R42 Dizziness and giddiness: Secondary | ICD-10-CM

## 2016-11-17 DIAGNOSIS — J45909 Unspecified asthma, uncomplicated: Secondary | ICD-10-CM

## 2016-11-17 DIAGNOSIS — Z79899 Other long term (current) drug therapy: Secondary | ICD-10-CM

## 2016-11-17 DIAGNOSIS — E785 Hyperlipidemia, unspecified: Secondary | ICD-10-CM

## 2016-11-17 DIAGNOSIS — Z7951 Long term (current) use of inhaled steroids: Secondary | ICD-10-CM

## 2016-11-17 DIAGNOSIS — R509 Fever, unspecified: Secondary | ICD-10-CM

## 2016-11-17 DIAGNOSIS — I10 Essential (primary) hypertension: Secondary | ICD-10-CM

## 2016-11-17 DIAGNOSIS — E876 Hypokalemia: Secondary | ICD-10-CM

## 2016-11-17 LAB — CBC
HCT: 34 % — ABNORMAL LOW (ref 36.0–46.0)
HEMOGLOBIN: 10.8 g/dL — AB (ref 12.0–15.0)
MCH: 23.5 pg — AB (ref 26.0–34.0)
MCHC: 31.8 g/dL (ref 30.0–36.0)
MCV: 73.9 fL — ABNORMAL LOW (ref 78.0–100.0)
PLATELETS: 183 10*3/uL (ref 150–400)
RBC: 4.6 MIL/uL (ref 3.87–5.11)
RDW: 23.2 % — AB (ref 11.5–15.5)
WBC: 12.8 10*3/uL — ABNORMAL HIGH (ref 4.0–10.5)

## 2016-11-17 LAB — BASIC METABOLIC PANEL
Anion gap: 11 (ref 5–15)
BUN: 8 mg/dL (ref 6–20)
CALCIUM: 8.4 mg/dL — AB (ref 8.9–10.3)
CO2: 24 mmol/L (ref 22–32)
CREATININE: 0.81 mg/dL (ref 0.44–1.00)
Chloride: 100 mmol/L — ABNORMAL LOW (ref 101–111)
Glucose, Bld: 123 mg/dL — ABNORMAL HIGH (ref 65–99)
Potassium: 3.4 mmol/L — ABNORMAL LOW (ref 3.5–5.1)
SODIUM: 135 mmol/L (ref 135–145)

## 2016-11-17 LAB — PREALBUMIN: PREALBUMIN: 9.4 mg/dL — AB (ref 18–38)

## 2016-11-17 LAB — SURGICAL PCR SCREEN
MRSA, PCR: NEGATIVE
STAPHYLOCOCCUS AUREUS: NEGATIVE

## 2016-11-17 MED ORDER — CHLORHEXIDINE GLUCONATE CLOTH 2 % EX PADS
6.0000 | MEDICATED_PAD | Freq: Every day | CUTANEOUS | Status: DC
  Administered 2016-11-18: 6 via TOPICAL

## 2016-11-17 MED ORDER — PRO-STAT SUGAR FREE PO LIQD
30.0000 mL | Freq: Two times a day (BID) | ORAL | Status: DC
  Administered 2016-11-18 – 2016-11-21 (×6): 30 mL via ORAL
  Filled 2016-11-17 (×7): qty 30

## 2016-11-17 MED ORDER — POTASSIUM CHLORIDE CRYS ER 20 MEQ PO TBCR
40.0000 meq | EXTENDED_RELEASE_TABLET | Freq: Two times a day (BID) | ORAL | Status: AC
  Administered 2016-11-17 (×2): 40 meq via ORAL
  Filled 2016-11-17 (×2): qty 2

## 2016-11-17 NOTE — Progress Notes (Signed)
Patient ID: Suzanne Macdonald, female   DOB: 1950/10/24, 66 y.o.   MRN: [id number]     Subjective: Feels the same. Tired.  Objective: Vital signs in last 24 hours: Temp:  [98.5 F (36.9 C)-102.1 F (38.9 C)] 102.1 F (38.9 C) (03/31 0511) Pulse Rate:  [62-87] 76 (03/31 0511) Resp:  [15-24] 24 (03/30 2230) BP: (114-165)/(52-101) 150/52 (03/31 0511) SpO2:  [95 %-100 %] 95 % (03/31 0511) Weight:  [117 kg (258 lb)] 117 kg (258 lb) (03/30 2317) Last BM Date: 11/15/16  Intake/Output from previous day: 03/30 0701 - 03/31 0700 In: 50 [IV Piggyback:50] Out: -  Intake/Output this shift: No intake/output data recorded.  General appearance: alert, cooperative, fatigued and no distress Incision/Wound: Area of granulation tissue right lateral abdominal wall with slight purulent drainage, surrounding erythema and tenderness.  Lab Results:   Recent Labs  11/16/16 1347 11/17/16 0427  WBC 11.8* 12.8*  HGB 10.7* 10.8*  HCT 35.2* 34.0*  PLT 190 183   BMET  Recent Labs  11/16/16 1347 11/17/16 0427  NA 137 135  K 3.3* 3.4*  CL 105 100*  CO2 23 24  GLUCOSE 145* 123*  BUN 9 8  CREATININE 0.88 0.81  CALCIUM 8.3* 8.4*     Studies/Results: Dg Chest 2 View  Result Date: 11/16/2016 CLINICAL DATA:  Fevers EXAM: CHEST  2 VIEW COMPARISON:  09/29/2016 FINDINGS: Right-sided chest wall port is again seen and stable. Cardiac shadow is within normal limits. Lungs are well aerated bilaterally. Prior granulomatous disease is again identified. No focal infiltrate or sizable effusion is seen. No acute bony abnormality is noted. IMPRESSION: No active cardiopulmonary disease. Electronically Signed   By: Inez Catalina M.D.   On: 11/16/2016 14:26   Ct Abdomen Pelvis W Contrast  Result Date: 11/16/2016 CLINICAL DATA:  Right lower quadrant pain. Nausea, diarrhea for 1 week EXAM: CT ABDOMEN AND PELVIS WITH CONTRAST TECHNIQUE: Multidetector CT imaging of the abdomen and pelvis was performed using the  standard protocol following bolus administration of intravenous contrast. CONTRAST:  1 ISOVUE-300 IOPAMIDOL (ISOVUE-300) INJECTION 61% COMPARISON:  09/22/2016 FINDINGS: Lower chest: No acute abnormality. Hepatobiliary: No focal liver abnormality is seen. Low attenuation of the liver as can be seen with hepatic steatosis. No gallstones, gallbladder wall thickening, or biliary dilatation. Pancreas: Unremarkable. No pancreatic ductal dilatation or surrounding inflammatory changes. Spleen: Normal in size without focal abnormality. Adrenals/Urinary Tract: Adrenal glands are unremarkable. Kidneys are normal, without renal calculi, focal lesion, or hydronephrosis. Bladder is unremarkable. Stomach/Bowel: Prior gastric bypass surgery. No bowel dilatation or bowel wall thickening. No pneumatosis, pneumoperitoneum or portal venous gas. Normal appendix. Vascular/Lymphatic: Abdominal aortic atherosclerosis. Normal caliber abdominal aorta. No lymphadenopathy. Reproductive: Status post hysterectomy. No adnexal masses. Other: 4.3 x 7.8 cm complex fluid collection in the right lateral anterior abdominal wall with surrounding inflammatory changes consistent with an abscess. Musculoskeletal: No acute or significant osseous findings. Mild degenerative changes of bilateral sacroiliac joints. Bilateral facet arthropathy at L3-4, L4-5 and L5-S1. IMPRESSION: 1. 4.3 x 7.8 cm right lateral anterior abdominal wall abscess with surrounding cellulitis. 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 No apparent significant involvement of the muscular abdominal wall or intraperitoneal extension by CT scan. However abscess has been multiply recurrent. We plan incision and drainage and likely more extensive debridement of the area in the operating room tomorrow under general anesthesia. This was all discussed with the patient and she agrees. Holding anticoagulation preop.    LOS: 1 day    Sharin Altidor T 11/17/2016

## 2016-11-17 NOTE — ED Provider Notes (Signed)
Medical screening examination/treatment/procedure(s) were conducted as a shared visit with non-physician practitioner(s) and myself.  I personally evaluated the patient during the encounter.   EKG Interpretation  Date/Time:  Friday November 16 2016 18:51:16 EDT Ventricular Rate:  69 PR Interval:    QRS Duration: 101 QT Interval:  419 QTC Calculation: 449 R Axis:   77 Text Interpretation:  Sinus rhythm Abnormal R-wave progression, early transition no acute changes  Confirmed by Brizeida Mcmurry MD, Evelyna Folker [phone]) on 11/16/2016 7:40:52 PM      66 year old female who presents with fever and abdominal wall pain. History of PAF on Eliquis and incarcerated hernia repair in 04/2016 c/b recurrent abdominal wall abscesses that was last drained by general surgery 09/2016. States onset of fever over past 4 days with right sided abdominal pain and increased redness around the site of her prior drainage.   She is febrile here. No elevated lactic acid, and mildly elevated WBC. With erythema, warmth surrounding prior site of abscess drainage over right abdomen. CT abd/pelvis visualized and with recurrent abdominal wall abscess. Started on IV antibiotics and general surgery consulted. Admitted to general medicine teaching service for ongoing management.   Forde Dandy, MD 11/17/16 [phone]

## 2016-11-17 NOTE — Progress Notes (Addendum)
Initial Nutrition Assessment  DOCUMENTATION CODES:  Morbid obesity  INTERVENTION:  Will order 30 mL Prostat BID, each supplement provides 100 kcal and 15 grams of protein.  With history of Gastric Bypass, may recommend on discharge starting  bariatric supplementation of Iron, 2 multivitamins daily 1500 mg Calcium Citrate, 350-500 mcg sublingual B12   NUTRITION DIAGNOSIS:  Unintentional weight loss related to Chronic illness recurrent Abdomen abscesses as evidenced by loss of >10% bw in the 6 months since orginal hernia repair.  GOAL:  Patient will meet greater than or equal to 90% of their needs  MONITOR:  PO intake, Supplement acceptance, Diet advancement, Labs, Weight trends  REASON FOR ASSESSMENT:  Consult Assessment of nutrition requirement/status  ASSESSMENT:  66 y/o female PMHx Morbid obesity, PAS, HTN, Colon cancer, gastric bypass, incarcerated hernia repair Sept 1287 complicated by recurrent abdominal wall abscesses. Presented with 4 day history of R sided abdominal pain, fever, nausea, dizziness, headache, 2-3 days diarrhea (4-6 episodes per day). Worked up for another abdominal wall abscess with cellulitis. Admitted for planned I/D.   Pt reports that despite her infection, she has not noticed a decrease in her appetite, nor a decrease in her weight. She says "I feel bigger; more swollen". At baseline, she will eat 2-3x a day. She did not follow any typically diet. She took B12, Iron and D3 gummies. Though she had a history of gastric bypass, she says she had never been told to supplement long term. Told 2xs mvis and calcium + D3 is recommended-Already taking iron/b12  She says her weight at the time of her hernia repair in September was ~295 lbs. Using weight documentation from chart, she has lost >10% bw since that time. She appears to have lost 15 lbs solely in the last month, likely in part to infectious process. However, again, she denies noticing any changes in her appetite  or feeling like she has lost weight-may need reweight when able (in chair at time of interview)  She says she has had Ensure/Boost in the past, but she does not like them. RD discussed importance of PO intake while recovering. Gave other supplement offers. She was agreeable to trying Prostat.   NFPE: Deferred.   Labs: BG 120-155, WBC:12.8, Prealbumin: 9.4, Albumin: 2.9 Medications: IV abx, Iron, KCL   Recent Labs Lab 11/14/16 2124 11/16/16 1347 11/17/16 0427  NA 134* 137 135  K 3.5 3.3* 3.4*  CL 102 105 100*  CO2 '22 23 24  '$ BUN '15 9 8  '$ CREATININE 0.86 0.88 0.81  CALCIUM 8.6* 8.3* 8.4*  GLUCOSE 152* 145* 123*    Diet Order:  Diet regular Room service appropriate? Yes; Fluid consistency: Thin Diet NPO time specified  Skin:  Abscess to abdomen.   Last BM:  3/29  Height:  Ht Readings from Last 1 Encounters:  11/16/16 '5\' 2"'$  (1.575 m)   Weight:  Wt Readings from Last 1 Encounters:  11/16/16 258 lb (117 kg)   Wt Readings from Last 10 Encounters:  11/16/16 258 lb (117 kg)  10/11/16 276 lb 14.4 oz (125.6 kg)  09/22/16 274 lb (124.3 kg)  09/03/16 276 lb 3.2 oz (125.3 kg)  08/24/16 284 lb 6.3 oz (129 kg)  08/11/16 284 lb 6.3 oz (129 kg)  07/30/16 284 lb 6.4 oz (129 kg)  07/23/16 280 lb 4 oz (127.1 kg)  07/12/16 275 lb 3.2 oz (124.8 kg)  06/27/16 283 lb 6.4 oz (128.5 kg)   Ideal Body Weight:  50 kg  BMI:  Body mass index is 47.19 kg/m.  Estimated Nutritional Needs:  Kcal:  1750-2000 kcals (15-17 kcal/kg bw) Protein:  70-80g Pro (1.4-1.6 g/kg ibw) Fluid:  1.8-2 L fluid   EDUCATION NEEDS:  No education needs identified at this time  Burtis Junes RD, LDN, [facility] Pager: [phone] 11/17/2016 1:44 PM

## 2016-11-17 NOTE — Anesthesia Preprocedure Evaluation (Addendum)
Anesthesia Evaluation  Patient identified by MRN, date of birth, ID band Patient awake    Reviewed: Allergy & Precautions, NPO status , Patient's Chart, lab work & pertinent test results, reviewed documented beta blocker date and time   History of Anesthesia Complications Negative for: history of anesthetic complications  Airway Mallampati: I  TM Distance: >3 FB Neck ROM: Full    Dental  (+) Edentulous Upper, Edentulous Lower, Dental Advisory Given   Pulmonary asthma , sleep apnea (does not need CPAP) , former smoker,    Pulmonary exam normal        Cardiovascular hypertension, Pt. on medications and Pt. on home beta blockers (-) anginaNormal cardiovascular exam+ dysrhythmias Atrial Fibrillation   '17 ECHO: EF 55-60%, valves OK   Neuro/Psych negative neurological ROS     GI/Hepatic Neg liver ROS, GERD  Medicated and Controlled,H/o gastric bypass and lap band   Endo/Other  Morbid obesity  Renal/GU negative Renal ROS     Musculoskeletal  (+) Arthritis ,   Abdominal (+) + obese,   Peds  Hematology  (+) Blood dyscrasia (Hb 7,9), anemia , Eliquis: last dose 09/22/16   Anesthesia Other Findings   Reproductive/Obstetrics                            Anesthesia Physical  Anesthesia Plan  ASA: III  Anesthesia Plan: General   Post-op Pain Management:    Induction: Intravenous  Airway Management Planned: Oral ETT  Additional Equipment:   Intra-op Plan:   Post-operative Plan: Extubation in OR  Informed Consent: I have reviewed the patients History and Physical, chart, labs and discussed the procedure including the risks, benefits and alternatives for the proposed anesthesia with the patient or authorized representative who has indicated his/her understanding and acceptance.   Dental advisory given  Plan Discussed with: CRNA and Anesthesiologist  Anesthesia Plan Comments:         Anesthesia Quick Evaluation

## 2016-11-17 NOTE — Progress Notes (Addendum)
Subjective: Currently, the patient is feeling poor. She endorses feeling very tired and complains of not sleeping well. Still has significant tenderness of her belly, but improved w/ pain meds and Abx. No further diarrhea. Denies fevers/chills.  Interval Events: WBC increased to 12.8 from 11.8. Febrile ON to 102.1 F.  Objective: Vital signs in last 24 hours: Vitals:   11/16/16 2015 11/16/16 2230 11/16/16 2317 11/17/16 0511  BP: (!) 131/56 128/73 117/60 (!) 150/52  Pulse: 70 87 74 76  Resp: 19 (!) 24    Temp:   98.9 F (37.2 C) (!) 102.1 F (38.9 C)  TempSrc:   Oral Oral  SpO2: 100% 97% 98% 95%  Weight:   258 lb (117 kg)   Height:   '5\' 2"'$  (1.575 m)    Physical Exam: Physical Exam  Constitutional: She is cooperative.  Obese female in mild distress lying in bed with her blanket over her head.  Cardiovascular: Normal rate, regular rhythm, normal heart sounds, intact distal pulses and normal pulses.  Exam reveals no gallop.   No murmur heard. Pulmonary/Chest: Effort normal and breath sounds normal. No respiratory distress. Breasts are symmetrical.  Abdominal: Soft. Bowel sounds are normal. There is tenderness in the right upper quadrant and right lower quadrant. There is guarding. There is no rigidity and no rebound.    Musculoskeletal: She exhibits no edema.   Labs: CBC:  Recent Labs Lab 11/14/16 2124 11/16/16 1347 11/17/16 0427  WBC 10.8* 11.8* 12.8*  NEUTROABS 7.8* 9.2*  --   HGB 11.4* 10.7* 10.8*  HCT 36.3 35.2* 34.0*  MCV 73.8* 73.9* 73.9*  PLT 191 073 710   Metabolic Panel:  Recent Labs Lab 11/14/16 2124 11/16/16 1347 11/17/16 0427  NA 134* 137 135  K 3.5 3.3* 3.4*  CL 102 105 100*  CO2 '22 23 24  '$ GLUCOSE 152* 145* 123*  BUN '15 9 8  '$ CREATININE 0.86 0.88 0.81  CALCIUM 8.6* 8.3* 8.4*  ALT 24 21  --   ALKPHOS 102 73  --   BILITOT 0.3 0.9  --   PROT 6.2* 6.1*  --   ALBUMIN 3.3* 2.9*  --   LABPROT  --  17.5*  --   INR  --  1.43  --       Medications: Scheduled Medications: . ampicillin-sulbactam (UNASYN) IV  3 g Intravenous Q6H  . atorvastatin  10 mg Oral Daily  . carvedilol  18.75 mg Oral BID WC  . diltiazem  120 mg Oral Daily  . ferrous sulfate  325 mg Oral Q breakfast  . flecainide  100 mg Oral BID  . heparin  5,000 Units Subcutaneous Q8H  . lisinopril  5 mg Oral Daily  . loratadine  10 mg Oral Daily  . mometasone-formoterol  2 puff Inhalation BID  . potassium chloride  40 mEq Oral BID  . pregabalin  75 mg Oral QHS  . traZODone  100 mg Oral QHS  . triamcinolone cream   Topical BID   PRN Medications: acetaminophen **OR** acetaminophen, albuterol, HYDROcodone-acetaminophen, ketorolac, ondansetron **OR** ondansetron (ZOFRAN) IV  Assessment/Plan: Suzanne Macdonald a 66 y.o.lady with PMHx significant for morbid obesity, PAF (Eliquis), HTN, colon CA, gastric bypass, incarcerated hernia s/p repair (Sept 6269) 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 She has been treated with multiple I&Ds + Abx in the past. She presented to the ED c/o worsening right abdominal wall pain, purulent discharge from her wound, and  fever with chills for 4 days.  Recurrent right abdominal wall cellulitis and abscess. CT demonstrates recurrent abcess. Cultures in the past positive for Klebsiella, Enterococcus, E. coli. s/p cefazolin in ED, now on Unasyn. Surgery c/s, plan I&D vs perc drain Sunday after AC washout. - Surgery c/s, appreciate recs - continue Unasyn 3g IV q6h - Norco 5-'325mg'$  q4h PRN pain - Toradol '15mg'$  IV PRN severe pain - Zofran PRN N/V  Diarrhea. Resolved. High risk for C. dif 2/2 recent abx, but no diarrhea since arrival > 24h. - d/c C. dif, resend for new diarrhea  Hypokalemia. K 3.4 after repletion yesterday. Continue replacement 72mq x 2 today. - repeat BMP in AM  PAF. AC w/ Eliquis '5mg'$  BID, rhythm control w/ flecainide '100mg'$  BID, rate control w/ Coreg  12.'5mg'$  BID + dilt XR '120mg'$  daily. Hold AC for I&D. - continue home meds for rate/rhythm control  Hypertension. Normotensive. Continue home meds: lisinopril '5mg'$  qD + Coreg/Dilt as above.  Asthma. Continue home Symbicort and albuterol PRN.  Hyperlipidemia. Continued home Lipitor '10mg'$  qD.  Length of Stay: 1 day(s) Dispo: Anticipated discharge in approximately 1-2 day following surgical management of abcess.  BHolley Raring MD Pager: [phone](7AM-5PM) 11/17/2016, 10:37 AM

## 2016-11-18 ENCOUNTER — Encounter (HOSPITAL_COMMUNITY): Payer: Self-pay | Admitting: Certified Registered Nurse Anesthetist

## 2016-11-18 ENCOUNTER — Inpatient Hospital Stay (HOSPITAL_COMMUNITY): Payer: Medicare Other | Admitting: Anesthesiology

## 2016-11-18 ENCOUNTER — Encounter (HOSPITAL_COMMUNITY)
Admission: EM | Disposition: A | Discharge status: Home Health | Payer: Self-pay | Source: Home / Self Care | Attending: Internal Medicine

## 2016-11-18 HISTORY — PX: DEBRIDEMENT OF ABDOMINAL WALL ABSCESS: SHX6396

## 2016-11-18 SURGERY — DEBRIDEMENT OF ABDOMINAL WALL ABSCESS
Anesthesia: General | Site: Abdomen | Laterality: Left

## 2016-11-18 MED ORDER — PROPOFOL 10 MG/ML IV BOLUS
INTRAVENOUS | Status: DC | PRN
  Administered 2016-11-18: 120 mg via INTRAVENOUS
  Administered 2016-11-18: 40 mg via INTRAVENOUS

## 2016-11-18 MED ORDER — FENTANYL CITRATE (PF) 100 MCG/2ML IJ SOLN
INTRAMUSCULAR | Status: DC | PRN
  Administered 2016-11-18: 50 ug via INTRAVENOUS
  Administered 2016-11-18: 100 ug via INTRAVENOUS
  Administered 2016-11-18: 50 ug via INTRAVENOUS

## 2016-11-18 MED ORDER — LIDOCAINE HCL (CARDIAC) 20 MG/ML IV SOLN
INTRAVENOUS | Status: DC | PRN
Start: 2016-11-18 — End: 2016-11-18
  Administered 2016-11-18: 100 mg via INTRAVENOUS

## 2016-11-18 MED ORDER — PROPOFOL 10 MG/ML IV BOLUS
INTRAVENOUS | Status: AC
  Filled 2016-11-18: qty 20

## 2016-11-18 MED ORDER — 0.9 % SODIUM CHLORIDE (POUR BTL) OPTIME
TOPICAL | Status: DC | PRN
  Administered 2016-11-18: 1000 mL

## 2016-11-18 MED ORDER — MIDAZOLAM HCL 2 MG/2ML IJ SOLN
INTRAMUSCULAR | Status: AC
  Filled 2016-11-18: qty 2

## 2016-11-18 MED ORDER — FENTANYL CITRATE (PF) 250 MCG/5ML IJ SOLN
INTRAMUSCULAR | Status: AC
  Filled 2016-11-18: qty 5

## 2016-11-18 MED ORDER — PHENYLEPHRINE HCL 10 MG/ML IJ SOLN
INTRAMUSCULAR | Status: DC | PRN
  Administered 2016-11-18: 25 ug/min via INTRAVENOUS

## 2016-11-18 MED ORDER — SUCCINYLCHOLINE CHLORIDE 20 MG/ML IJ SOLN
INTRAMUSCULAR | Status: DC | PRN
  Administered 2016-11-18: 120 mg via INTRAVENOUS

## 2016-11-18 MED ORDER — LACTATED RINGERS IV SOLN
INTRAVENOUS | Status: DC | PRN
  Administered 2016-11-18: 09:00:00 via INTRAVENOUS

## 2016-11-18 MED ORDER — MIDAZOLAM HCL 5 MG/5ML IJ SOLN
INTRAMUSCULAR | Status: DC | PRN
  Administered 2016-11-18 (×2): 1 mg via INTRAVENOUS

## 2016-11-18 SURGICAL SUPPLY — 31 items
BLADE CLIPPER SURG (BLADE) ×3 IMPLANT
BNDG GAUZE ELAST 4 BULKY (GAUZE/BANDAGES/DRESSINGS) ×3 IMPLANT
COVER SURGICAL LIGHT HANDLE (MISCELLANEOUS) ×3 IMPLANT
DRAPE LAPAROSCOPIC ABDOMINAL (DRAPES) ×3 IMPLANT
DRSG PAD ABDOMINAL 8X10 ST (GAUZE/BANDAGES/DRESSINGS) ×6 IMPLANT
ELECT BLADE 4.0 EZ CLEAN MEGAD (MISCELLANEOUS) ×3
ELECT CAUTERY BLADE 6.4 (BLADE) ×3 IMPLANT
ELECT REM PT RETURN 9FT ADLT (ELECTROSURGICAL) ×3
ELECTRODE BLDE 4.0 EZ CLN MEGD (MISCELLANEOUS) ×1 IMPLANT
ELECTRODE REM PT RTRN 9FT ADLT (ELECTROSURGICAL) ×1 IMPLANT
GAUZE SPONGE 4X4 12PLY STRL (GAUZE/BANDAGES/DRESSINGS) ×3 IMPLANT
GLOVE BIO SURGEON STRL SZ 6 (GLOVE) ×3 IMPLANT
GLOVE BIOGEL PI IND STRL 6.5 (GLOVE) ×1 IMPLANT
GLOVE BIOGEL PI IND STRL 7.0 (GLOVE) ×2 IMPLANT
GLOVE BIOGEL PI IND STRL 7.5 (GLOVE) ×1 IMPLANT
GLOVE BIOGEL PI INDICATOR 6.5 (GLOVE) ×2
GLOVE BIOGEL PI INDICATOR 7.0 (GLOVE) ×4
GLOVE BIOGEL PI INDICATOR 7.5 (GLOVE) ×2
GLOVE SKINSENSE NS SZ7.5 (GLOVE) ×4
GLOVE SKINSENSE STRL SZ7.5 (GLOVE) ×2 IMPLANT
GOWN STRL REUS W/ TWL LRG LVL3 (GOWN DISPOSABLE) ×1 IMPLANT
GOWN STRL REUS W/TWL LRG LVL3 (GOWN DISPOSABLE) ×2
KIT BASIN OR (CUSTOM PROCEDURE TRAY) ×3 IMPLANT
KIT ROOM TURNOVER OR (KITS) ×3 IMPLANT
NS IRRIG 1000ML POUR BTL (IV SOLUTION) ×3 IMPLANT
PACK GENERAL/GYN (CUSTOM PROCEDURE TRAY) ×3 IMPLANT
PAD ARMBOARD 7.5X6 YLW CONV (MISCELLANEOUS) ×6 IMPLANT
SWAB COLLECTION DEVICE MRSA (MISCELLANEOUS) ×3 IMPLANT
TAPE CLOTH SURG 6X10 WHT LF (GAUZE/BANDAGES/DRESSINGS) ×3 IMPLANT
TOWEL OR 17X26 10 PK STRL BLUE (TOWEL DISPOSABLE) ×3 IMPLANT
TUBE ANAEROBIC SPECIMEN COL (MISCELLANEOUS) ×3 IMPLANT

## 2016-11-18 NOTE — Transfer of Care (Signed)
Immediate Anesthesia Transfer of Care Note  Patient: Suzanne Macdonald  Procedure(s) Performed: Procedure(s): DEBRIDEMENT OF ABDOMINAL WALL ABSCESS (Left)  Patient Location: PACU  Anesthesia Type:General  Level of Consciousness: awake, alert , oriented and patient cooperative  Airway & Oxygen Therapy: Patient Spontanous Breathing and Patient connected to nasal cannula oxygen  Post-op Assessment: Report given to RN and Post -op Vital signs reviewed and stable  Post vital signs: Reviewed and stable  Last Vitals:  Vitals:   11/18/16 0623 11/18/16 1008  BP: (!) 144/58   Pulse: 76   Resp: 17   Temp: 37.6 C 36.2 C    Last Pain:  Vitals:   11/18/16 0623  TempSrc: Oral  PainSc:          Complications: No apparent anesthesia complications

## 2016-11-18 NOTE — Anesthesia Postprocedure Evaluation (Addendum)
Anesthesia Post Note  Patient: Suzanne Macdonald  Procedure(s) Performed: Procedure(s) (LRB): DEBRIDEMENT OF ABDOMINAL WALL ABSCESS (Left)  Patient location during evaluation: PACU Anesthesia Type: General Level of consciousness: sedated Pain management: pain level controlled Vital Signs Assessment: post-procedure vital signs reviewed and stable Respiratory status: spontaneous breathing and respiratory function stable Cardiovascular status: stable Anesthetic complications: no       Last Vitals:  Vitals:   11/18/16 1015 11/18/16 1030  BP: (!) 128/57   Pulse: 70 66  Resp: (!) 23 20  Temp:      Last Pain:  Vitals:   11/18/16 1008  TempSrc:   PainSc: 0-No pain                 [name] DANIEL

## 2016-11-18 NOTE — Progress Notes (Signed)
Patient ID: Suzanne Macdonald, female   DOB: [birth year], 66 y.o.   MRN: [id number] Day of Surgery   Chief complaint/Subjective: No new complaints.   Objective: Vital signs in last 24 hours: Temp:  [98.2 F (36.8 C)-99.7 F (37.6 C)] 99.7 F (37.6 C) (04/01 0623) Pulse Rate:  [64-76] 76 (04/01 0623) Resp:  [16-17] 17 (04/01 0623) BP: (116-144)/(47-58) 144/58 (04/01 0623) SpO2:  [97 %-99 %] 98 % (04/01 0623) Last BM Date: 11/15/16  Intake/Output from previous day: 03/31 0701 - 04/01 0700 In: 1287 [P.O.:1187; IV Piggyback:100] Out: 150 [Urine:150] Intake/Output this shift: No intake/output data recorded.  General appearance: alert, cooperative, fatigued and no distress Incision/Wound: Area of granulation tissue right lateral abdominal wall with slight purulent drainage, surrounding erythema and tenderness.  Lab Results:   Recent Labs  11/16/16 1347 11/17/16 0427  WBC 11.8* 12.8*  HGB 10.7* 10.8*  HCT 35.2* 34.0*  PLT 190 183   BMET  Recent Labs  11/16/16 1347 11/17/16 0427  NA 137 135  K 3.3* 3.4*  CL 105 100*  CO2 23 24  GLUCOSE 145* 123*  BUN 9 8  CREATININE 0.88 0.81  CALCIUM 8.3* 8.4*     Studies/Results: Dg Chest 2 View  Result Date: 11/16/2016 CLINICAL DATA:  Fevers EXAM: CHEST  2 VIEW COMPARISON:  09/29/2016 FINDINGS: Right-sided chest wall port is again seen and stable. Cardiac shadow is within normal limits. Lungs are well aerated bilaterally. Prior granulomatous disease is again identified. No focal infiltrate or sizable effusion is seen. No acute bony abnormality is noted. IMPRESSION: No active cardiopulmonary disease. Electronically Signed   By: Inez Catalina M.D.   On: 11/16/2016 14:26   Ct Abdomen Pelvis W Contrast  Result Date: 11/16/2016 CLINICAL DATA:  Right lower quadrant pain. Nausea, diarrhea for 1 week EXAM: CT ABDOMEN AND PELVIS WITH CONTRAST TECHNIQUE: Multidetector CT imaging of the abdomen and pelvis was performed using the standard  protocol following bolus administration of intravenous contrast. CONTRAST:  1 ISOVUE-300 IOPAMIDOL (ISOVUE-300) INJECTION 61% COMPARISON:  09/22/2016 FINDINGS: Lower chest: No acute abnormality. Hepatobiliary: No focal liver abnormality is seen. Low attenuation of the liver as can be seen with hepatic steatosis. No gallstones, gallbladder wall thickening, or biliary dilatation. Pancreas: Unremarkable. No pancreatic ductal dilatation or surrounding inflammatory changes. Spleen: Normal in size without focal abnormality. Adrenals/Urinary Tract: Adrenal glands are unremarkable. Kidneys are normal, without renal calculi, focal lesion, or hydronephrosis. Bladder is unremarkable. Stomach/Bowel: Prior gastric bypass surgery. No bowel dilatation or bowel wall thickening. No pneumatosis, pneumoperitoneum or portal venous gas. Normal appendix. Vascular/Lymphatic: Abdominal aortic atherosclerosis. Normal caliber abdominal aorta. No lymphadenopathy. Reproductive: Status post hysterectomy. No adnexal masses. Other: 4.3 x 7.8 cm complex fluid collection in the right lateral anterior abdominal wall with surrounding inflammatory changes consistent with an abscess. Musculoskeletal: No acute or significant osseous findings. Mild degenerative changes of bilateral sacroiliac joints. Bilateral facet arthropathy at L3-4, L4-5 and L5-S1. IMPRESSION: 1. 4.3 x 7.8 cm right lateral anterior abdominal wall abscess with surrounding cellulitis. Electronically Signed   By: Kathreen Devoid   On: 11/16/2016 17:49    Anti-infectives: Anti-infectives    Start     Dose/Rate Route Frequency Ordered Stop   11/17/16 0200  Ampicillin-Sulbactam (UNASYN) 3 g in sodium chloride 0.9 % 100 mL IVPB     3 g 200 mL/hr over 30 Minutes Intravenous Every 6 hours 11/16/16 2209     11/16/16 1830  ceFAZolin (ANCEF) IVPB 1 g/50 mL premix  1 g 100 mL/hr over 30 Minutes Intravenous  Once 11/16/16 1829 11/16/16 1946      Assessment/Plan: Recurrent or  persistent right lateral abdominal wall abscess with remote history of extensive ventral hernia repair using Phasix mesh. No apparent significant involvement of the muscular abdominal wall or intraperitoneal extension by CT scan. However abscess has been multiply recurrent. We plan incision and drainage and likely more extensive debridement of the area in the operating room today.    LOS: 2 days    Clovis Riley 11/18/2016

## 2016-11-18 NOTE — Anesthesia Procedure Notes (Signed)
Procedure Name: Intubation Date/Time: 11/18/2016 9:11 AM Performed by: Oletta Lamas Pre-anesthesia Checklist: Patient identified, Emergency Drugs available, Suction available and Patient being monitored Patient Re-evaluated:Patient Re-evaluated prior to inductionOxygen Delivery Method: Circle System Utilized Preoxygenation: Pre-oxygenation with 100% oxygen Intubation Type: IV induction Ventilation: Mask ventilation without difficulty Laryngoscope Size: Miller and 2 Grade View: Grade I Tube type: Oral Tube size: 7.0 mm Number of attempts: 1 Airway Equipment and Method: Stylet Placement Confirmation: ETT inserted through vocal cords under direct vision,  positive ETCO2 and breath sounds checked- equal and bilateral Secured at: 21 cm Tube secured with: Tape Dental Injury: Teeth and Oropharynx as per pre-operative assessment

## 2016-11-18 NOTE — Op Note (Signed)
Operative Note  Suzanne Macdonald  [id number]  [id number]  11/18/2016   Surgeon: Clovis Riley  Assistant: Adonis Housekeeper  Procedure performed: debridement of abdominal wall abscess, subcutaneous tissues and fascia, final wound 10x10cm, 8cm deep  Preop diagnosis: recurrent abdominal wall abscess Post-op diagnosis/intraop findings: same. No communication with the abdominal cavity. subcentimeter portion of fasica excised, peritoneum not violated  Specimens: cultures Retained items: kerlix packing to be changed in 24 hr EBL: 90VQ Complications: none  Description of procedure: After obtaining informed consent the patient was taken to the operating room and placed supine on operating room table wheregeneral endotracheal anesthesia was initiated, preoperative antibiotics were administered, SCDs applied, and a formal timeout was performed. The abdomen was prepped and draped in the usual sterile fashion. The prior scar/granulation from prior I&Ds was excised with an ellipse of skin and subcutaneous fat, Immediate egress of pus from the abscess was evacuated with a sucker and cultured. The wound was extended and the abscess cavity inspected. There was chronic abscess cavity which was circumferentially debrided using cautery. At the medial aspect of the wound this did involve a small part of the fascia which was excised superficially leaving the deeper fascia and peritoneum intact. I continued to excise the gelatinous abscess lining and scarrring within the soft tissues until the entier wound bed appeared healthy. The tissues were viable and well perfused. The wound was probed to confirm no deeper communication. There was some tracking superiorly and laterally and the skin over these tracts was excised to create one large wound. Hemostasis was ensured in the wound with cautery. The wound was then irrigated and packed with a saline moistened kerlix. Gauze and dry dry sterile dressings were aplied. The  patient was then awakened, extubated and taken to PACU in stable condition.   All counts were correct at the completion of the case.

## 2016-11-18 NOTE — Progress Notes (Signed)
   Subjective:  Patient seen prior to I & D this morning. She was feeling better this morning after getting some more rest. She says her pain is controlled. She denies any fevers, chills, diaphoresis.   Objective:  Vital signs in last 24 hours: Vitals:   11/17/16 1553 11/17/16 1734 11/17/16 2151 11/18/16 0623  BP:   (!) 116/47 (!) 144/58  Pulse:   73 76  Resp:   17 17  Temp: 99.2 F (37.3 C) 99.4 F (37.4 C) 99.5 F (37.5 C) 99.7 F (37.6 C)  TempSrc: Oral Oral Oral Oral  SpO2:   99% 98%  Weight:      Height:       General: resting in chair, no acute distress Cardiac: RRR, systolic murmur Pulm: clear to auscultation bilaterally, moving normal volumes of air Abd: obese abdomen, right sided abdominal wall abscess with dressings in place Ext: warm and well perfused Neuro: alert and oriented X3   Assessment/Plan:  Principal Problem:   Cellulitis of right abdominal wall Active Problems:   Essential hypertension   Paroxysmal atrial fibrillation (HCC)   Asthma   Morbid obesity (HCC)  Suzanne Morrisis a 66 y.o.lady with PMHx significant for morbid obesity, PAF (Eliquis), HTN, colon CA, gastric bypass, incarcerated hernia s/p repair (Sept 9774) 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 She has been treated with multiple I&Ds + Abx in the past. She presented to the ED c/o worsening right abdominal wall pain,purulent discharge from her wound, and fever with chills for 4 days.  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 Cultures in the past positive for Klebsiella, Enterococcus, E. coli. s/p cefazolin in ED, now on Unasyn. Going for I&D today with plan for extensive debridement. - Surgery c/s, appreciate recs - Continue Unasyn 3g IV q6h - Norco 5-'325mg'$  q4h PRN pain - Toradol '15mg'$  IV PRN severe pain - Zofran PRN N/V  PAF. AC w/ Eliquis '5mg'$  BID, rhythm control w/  flecainide '100mg'$  BID, rate control w/ Coreg 18.'75mg'$  BID + dilt XR '120mg'$  daily. Hold AC for I&D. - Continue home meds for rate/rhythm control - Holding Eliqiuis for I&D, likely resume 24h post-op  Hypertension: Continue home meds: lisinopril '5mg'$  qD + Coreg/Dilt as above.  Asthma.Continue home Symbicort and albuterol PRN.  Hyperlipidemia. Continue home Lipitor '10mg'$  qD.   Dispo: Anticipated discharge in approximately 1-2 day following surgical management of abcess.    Zada Finders, MD 11/18/2016, 9:45 AM

## 2016-11-19 ENCOUNTER — Encounter (HOSPITAL_COMMUNITY): Payer: Self-pay | Admitting: Surgery

## 2016-11-19 LAB — CBC
HEMATOCRIT: 30.2 % — AB (ref 36.0–46.0)
Hemoglobin: 9.2 g/dL — ABNORMAL LOW (ref 12.0–15.0)
MCH: 22.6 pg — AB (ref 26.0–34.0)
MCHC: 30.5 g/dL (ref 30.0–36.0)
MCV: 74.2 fL — ABNORMAL LOW (ref 78.0–100.0)
PLATELETS: 220 10*3/uL (ref 150–400)
RBC: 4.07 MIL/uL (ref 3.87–5.11)
RDW: 23.3 % — AB (ref 11.5–15.5)
WBC: 6.8 10*3/uL (ref 4.0–10.5)

## 2016-11-19 LAB — CULTURE, BLOOD (ROUTINE X 2)
CULTURE: NO GROWTH
CULTURE: NO GROWTH

## 2016-11-19 LAB — BASIC METABOLIC PANEL
ANION GAP: 10 (ref 5–15)
BUN: 15 mg/dL (ref 6–20)
CO2: 24 mmol/L (ref 22–32)
CREATININE: 0.78 mg/dL (ref 0.44–1.00)
Calcium: 8.2 mg/dL — ABNORMAL LOW (ref 8.9–10.3)
Chloride: 105 mmol/L (ref 101–111)
GFR calc Af Amer: >60 mL/min (ref >60)
GFR calc non Af Amer: >60 mL/min (ref >60)
GLUCOSE: 96 mg/dL (ref 65–99)
POTASSIUM: 3.8 mmol/L (ref 3.5–5.1)
Sodium: 139 mmol/L (ref 135–145)

## 2016-11-19 LAB — VITAMIN D 25 HYDROXY (VIT D DEFICIENCY, FRACTURES): VIT D 25 HYDROXY: 21.1 ng/mL — AB (ref 30.0–100.0)

## 2016-11-19 MED ORDER — APIXABAN 5 MG PO TABS
5.0000 mg | ORAL_TABLET | Freq: Two times a day (BID) | ORAL | Status: DC
  Administered 2016-11-19 – 2016-11-21 (×5): 5 mg via ORAL
  Filled 2016-11-19 (×5): qty 1

## 2016-11-19 MED ORDER — PANTOPRAZOLE SODIUM 40 MG PO TBEC
40.0000 mg | DELAYED_RELEASE_TABLET | Freq: Every day | ORAL | Status: DC
  Administered 2016-11-20 – 2016-11-21 (×3): 40 mg via ORAL
  Filled 2016-11-19 (×3): qty 1

## 2016-11-19 NOTE — Progress Notes (Signed)
1 Day Post-Op  Subjective: Open site repacked it measures 4 x 9 x 8 cm.  It is clean.  Culture pending.  She did fine with dressing change.  Objective: Vital signs in last 24 hours: Temp:  [97.2 F (36.2 C)-98.7 F (37.1 C)] 98.7 F (37.1 C) (04/02 0411) Pulse Rate:  [58-71] 58 (04/02 0411) Resp:  [17-23] 17 (04/02 0411) BP: (117-136)/(50-84) 126/53 (04/02 0411) SpO2:  [93 %-98 %] 97 % (04/02 0411) Last BM Date: 11/15/16  ABSCESS ABDOMEN   Special Requests ABDOMINAL WALL PATIENT ON FOLLOWING UNISYN   Gram Stain ABUNDANT WBC PRESENT, PREDOMINANTLY PMN  FEW GRAM POSITIVE COCCI IN CHAINS  RARE GRAM NEGATIVE RODS  RARE GRAM POSITIVE RODS     340 Po 1300 IV Voided x 3 BM x 1 Afebrile, VSS Labs OK, WBC down to 6.8 Intake/Output from previous day: 04/01 0701 - 04/02 0700 In: 1640 [P.O.:340; I.V.:800; IV Piggyback:500] Out: 25 [Blood:25] Intake/Output this shift: No intake/output data recorded.  General appearance: alert, cooperative and no distress Resp: clear to auscultation bilaterally GI: Open site looks fine, will start wet to dry dressings BID  Lab Results:   Recent Labs  11/17/16 0427 11/19/16 0357  WBC 12.8* 6.8  HGB 10.8* 9.2*  HCT 34.0* 30.2*  PLT 183 220    BMET  Recent Labs  11/17/16 0427 11/19/16 0357  NA 135 139  K 3.4* 3.8  CL 100* 105  CO2 24 24  GLUCOSE 123* 96  BUN 8 15  CREATININE 0.81 0.78  CALCIUM 8.4* 8.2*   PT/INR  Recent Labs  11/16/16 1347  LABPROT 17.5*  INR 1.43     Recent Labs Lab 11/14/16 2124 11/16/16 1347  AST 26 19  ALT 24 21  ALKPHOS 102 73  BILITOT 0.3 0.9  PROT 6.2* 6.1*  ALBUMIN 3.3* 2.9*     Lipase     Component Value Date/Time   LIPASE 21 08/09/2016 2313     Studies/Results: No results found. Prior to Admission medications   Medication Sig Start Date End Date Taking? Authorizing Provider  acetaminophen (TYLENOL ARTHRITIS PAIN) 650 MG CR tablet Take 650 mg by mouth every 8 (eight) hours as  needed for pain.   Yes Historical Provider, MD  acetaminophen (TYLENOL) 500 MG tablet Take 1,000 mg by mouth every 6 (six) hours as needed for mild pain or moderate pain.   Yes Historical Provider, MD  albuterol (PROVENTIL HFA;VENTOLIN HFA) 108 (90 Base) MCG/ACT inhaler Inhale 1-2 puffs into the lungs every 6 (six) hours as needed for wheezing or shortness of breath.   Yes Historical Provider, MD  apixaban (ELIQUIS) 5 MG TABS tablet Take 1 tablet (5 mg total) by mouth 2 (two) times daily. 10/01/16  Yes Bartholomew Crews, MD  atorvastatin (LIPITOR) 10 MG tablet take 1 tablet by mouth once daily 10/05/16  Yes Bartholomew Crews, MD  budesonide-formoterol [facility]) 160-4.5 MCG/ACT inhaler Inhale 2 puffs into the lungs 2 (two) times daily. Patient taking differently: Inhale 2 puffs into the lungs 2 (two) times daily as needed (for shortness of breath).  01/24/16  Yes Milagros Loll, MD  Calcium-Phosphorus-Vitamin D (CALCIUM/VITAMIN D3/ADULT GUMMY PO) Take 2 each by mouth daily.   Yes Historical Provider, MD  carvedilol (COREG) 6.25 MG tablet Take 3 tablets (18.75 mg total) by mouth 2 (two) times daily with a meal. 10/19/16  Yes [name], MD  cetirizine (ZYRTEC) 10 MG tablet Take 10 mg by mouth daily.   Yes  Historical Provider, MD  Cholecalciferol (VITAMIN D3) 2000 units CHEW Chew 2,000 Units by mouth 2 (two) times daily.   Yes Historical Provider, MD  cyanocobalamin 500 MCG tablet Take 1,000 mcg by mouth daily.    Yes Historical Provider, MD  diltiazem (CARDIZEM CD) 120 MG 24 hr capsule Take 1 capsule (120 mg total) by mouth daily. 07/13/16  Yes Alphonzo Grieve, MD  diltiazem (CARDIZEM) 30 MG tablet Take 1 tablet every 4 hours AS NEEDED for AFIB fast heart rate >100 as long as blood pressure >100. 07/30/16  Yes Sherran Needs, NP  docusate sodium (COLACE) 100 MG capsule Take 1 capsule (100 mg total) by mouth 2 (two) times daily as needed for mild constipation. 06/14/16  Yes Ledell Noss, MD  ferrous  sulfate 325 (65 FE) MG tablet Take 1 tablet (325 mg total) by mouth daily with breakfast. 09/26/16  Yes Kalman Drape, PA  flecainide (TAMBOCOR) 100 MG tablet take 1 tablet by mouth twice a day 10/29/16  Yes [name], MD  lisinopril (PRINIVIL,ZESTRIL) 5 MG tablet take 1 tablet by mouth once daily 07/27/16  Yes [name], MD  loperamide (IMODIUM) 2 MG capsule Take 2 mg by mouth daily as needed for diarrhea or loose stools.   Yes Historical Provider, MD  Multiple Vitamins-Minerals (ALIVE WOMENS GUMMY) CHEW Chew 2 each by mouth daily.   Yes Historical Provider, MD  omeprazole (PRILOSEC) 40 MG capsule Take 1 capsule (40 mg total) by mouth daily. 04/27/16  Yes [name], MD  potassium chloride (K-DUR) 10 MEQ tablet 2 TABLETS BY MOUTH DAILY Patient taking differently: Take 20 mEq by mouth daily.  06/27/16  Yes [name], MD  pregabalin (LYRICA) 75 MG capsule Take 1 capsule (75 mg total) by mouth at bedtime. Take 1 to 3 hours before bedtime 10/11/16  Yes Bartholomew Crews, MD  traZODone (DESYREL) 100 MG tablet Take 1 tablet (100 mg total) by mouth at bedtime. 10/11/16  Yes Bartholomew Crews, MD    Medications: . ampicillin-sulbactam (UNASYN) IV  3 g Intravenous Q6H  . atorvastatin  10 mg Oral Daily  . carvedilol  18.75 mg Oral BID WC  . diltiazem  120 mg Oral Daily  . feeding supplement (PRO-STAT SUGAR FREE 64)  30 mL Oral BID  . ferrous sulfate  325 mg Oral Q breakfast  . flecainide  100 mg Oral BID  . heparin  5,000 Units Subcutaneous Q8H  . lisinopril  5 mg Oral Daily  . loratadine  10 mg Oral Daily  . mometasone-formoterol  2 puff Inhalation BID  . pregabalin  75 mg Oral QHS  . traZODone  100 mg Oral QHS  . triamcinolone cream   Topical BID   No IV fluids  Assessment/Plan Recurrent abdominal wall abscessHX of gastric bypass,  colon and cervical cancer s/p debridement of abdominal wall abscess, subcutaneous tissues and fascia, final wound 10x10cm, 8cm deep,  11/18/16, Dr. Romana Juniper  POD 1 S/p I&D complex right abdominal wall abscess 08/24/16, Dr.Byerly Abdominal wall abscess; s/p [facility] 05/06/16, Dr. Excell Seltzer; IR drain 06/28/16 Atrial fibrillation on chronic anticoagulation - Eliquis if wound looks good she can resume  Eliquis tomorrow.   Hypertension Asthma OSA Body mass index is 47.19  Down from 52 in January FEN:  Carb Modified BP:ZWCHENIDP 3/30 x 1;  Unasyn 3/30 =>> day 4 DVT: SCD - DVT prophylaxis tonight,    Wet to dry dressings BID.  Await culture, set up home health for BID dressing  changes at home.  Teach family how to do dressing changes.  Follow up with Dr>  Hoxworth.      LOS: 3 days    Emera Bussie 11/19/2016 [phone]

## 2016-11-19 NOTE — Care Management Note (Signed)
Case Management Note  Patient Details  Name: Suzanne Macdonald MRN: [id number] Date of Birth: Jul 31, 1951  Subjective/Objective:   Pt admitted on 11/16/16 with recurrent abdominal wall abscess with surrounding cellulitis.  PTA, pt resided at home with spouse.  She has a hx of [place] services with [facility].                 Action/Plan: Pt will required dressing changes at discharge with packing.  She states husband willing and able to assist with wound care.  She prefers [facility] for [facility] follow up.  Referral to [facility] for [facility] services.  Start of care 24-48h post dc date.    Expected Discharge Date:                  Expected Discharge Plan:  [place]  In-House Referral:     Discharge planning Services  CM Consult  Post Acute Care Choice:  Home Health Choice offered to:  Patient  DME Arranged:    DME Agency:     HH Arranged:  RN [place] Agency:  [place]  Status of Service:  Completed, signed off  If discussed at [place] of Stay Meetings, dates discussed:    Additional Comments:  Reinaldo Raddle, RN, BSN  Trauma/Neuro ICU Case Manager [phone]

## 2016-11-19 NOTE — Progress Notes (Signed)
   Subjective: Patient is doing well this morning. Tolerating PO and reports multiple BM. Pain is well controlled.   Objective:  Vital signs in last 24 hours: Vitals:   11/18/16 1040 11/18/16 1750 11/18/16 2116 11/19/16 0411  BP:  136/84 (!) 125/50 (!) 126/53  Pulse: 65 71 65 (!) 58  Resp: '20 18 17 17  '$ Temp: 97.7 F (36.5 C) 98.4 F (36.9 C) 98.5 F (36.9 C) 98.7 F (37.1 C)  TempSrc:  Oral Oral Oral  SpO2: 94% 97% 98% 97%  Weight:      Height:        Physical Exam: Constitutional: NAD, appears comfortable Cardiovascular: RRR Pulmonary/Chest: CTAB  Abdominal: Soft, non tender, non distended. Hypoactive bowel sounds. Surgical site well wrapped, c/d/i.  Extremities: Warm and well perfused.  Neurological: A&Ox3, CN II - XII grossly intact.   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 She has been treated with multiple I&Ds + Abx in the past. She presented to the ED c/o worsening right abdominal wall pain,purulent discharge from her wound, and fever with chills for 4 days.  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 Cultures in the past positive for Klebsiella, Enterococcus, E. coli. s/pcefazolin in ED, now on Unasyn. S/p I&D and surgical debridement yesterday. Afebrile overnight. Surgical cultures pending.  - Surgery c/s, appreciate recs - Continue Unasyn 3g IV q6h for now; will likely transition to PO augmentin today pending surgery recs - f/u surgical cultures,  - Norco 5-'325mg'$  q4h PRN pain - Toradol '15mg'$  IV PRN severe pain - Zofran PRN N/V  PAF. AC w/ Eliquis '5mg'$  BID, rhythm control w/ flecainide '100mg'$  BID, rate control w/ Coreg 18.'75mg'$  BID + dilt XR '120mg'$  daily. Hold AC for I&D. -  Continue home meds for rate/rhythmcontrol - Resume Eliquis today    Hypertension: Continue home meds: lisinopril '5mg'$  qD + Coreg/Dilt as above.  Asthma.Continue homeSymbicort and albuterol PRN.  Hyperlipidemia.Continue home Lipitor '10mg'$  qD.  FEN: No fluids, replete lytes prn, carb mod diet  VTE ppx: Resume Eliquis today  Code Status: FULL   Dispo: Possible discharge today pending surgery recs.   Velna Ochs, MD 11/19/2016, 8:45 AM Pager: [phone]

## 2016-11-20 LAB — CBC
HCT: 30.8 % — ABNORMAL LOW (ref 36.0–46.0)
HEMOGLOBIN: 9.6 g/dL — AB (ref 12.0–15.0)
MCH: 23 pg — AB (ref 26.0–34.0)
MCHC: 31.2 g/dL (ref 30.0–36.0)
MCV: 73.7 fL — ABNORMAL LOW (ref 78.0–100.0)
Platelets: 252 10*3/uL (ref 150–400)
RBC: 4.18 MIL/uL (ref 3.87–5.11)
RDW: 23 % — ABNORMAL HIGH (ref 11.5–15.5)
WBC: 5.6 10*3/uL (ref 4.0–10.5)

## 2016-11-20 MED ORDER — HYDROCODONE-ACETAMINOPHEN 5-325 MG PO TABS: 1.0000 | ORAL_TABLET | Freq: Four times a day (QID) | ORAL | 0 refills | Status: DC | PRN

## 2016-11-20 MED ORDER — PRO-STAT SUGAR FREE PO LIQD: 30.0000 mL | Freq: Two times a day (BID) | ORAL | 1 refills | Status: AC

## 2016-11-20 MED ORDER — CEPHALEXIN 500 MG PO CAPS
500.0000 mg | ORAL_CAPSULE | Freq: Three times a day (TID) | ORAL | Status: DC
  Administered 2016-11-20 – 2016-11-21 (×3): 500 mg via ORAL
  Filled 2016-11-20 (×3): qty 1

## 2016-11-20 MED ORDER — CEPHALEXIN 500 MG PO CAPS: 500.0000 mg | ORAL_CAPSULE | Freq: Three times a day (TID) | ORAL | 0 refills | Status: DC

## 2016-11-20 NOTE — Progress Notes (Signed)
Transitions of Care Pharmacy Note  Plan:  Educated on [place] concerns regarding Tylenol use  --------------------------------------------- Suzanne Macdonald is an 66 y.o. female who presents with a chief complaint of recurrent abdominal wall infection. In anticipation of discharge, pharmacy has reviewed this patient's prior to admission medication history, as well as current inpatient medications listed per the [facility].  Current medication indications, dosing, frequency, and notable side effects reviewed with patient. patient verbalized understanding of current inpatient medication regimen and is aware that the After Visit Summary when presented, will represent the most accurate medication list at discharge.   Suzanne Macdonald expressed concerns regarding OTC use of Tylenol - we reviewed max dosage of 3 g/day and appropriate OTC use for arthritic pain.    Assessment: Understanding of regimen: good Understanding of indications: good Potential of compliance: good Barriers to Obtaining Medications: No  Patient instructed to contact inpatient pharmacy team with further questions or concerns if needed.    Time spent preparing for discharge counseling: 15 min Time spent counseling patient: 10 min   Thank you for allowing pharmacy to be a part of this patient's care.  Belia Heman, PharmD PGY1 Pharmacy Resident [phone] (Pager) 11/20/2016 8:18 PM

## 2016-11-20 NOTE — Consult Note (Addendum)
[place] Nurse wound consult note Reason for Consult: Consult requested for abd possible Vac application.  Surgical team following for assessment and plan of care.   Wound type: Full thickness post-op wound to right abd; it is appropriate for Vac therapy. Measurement: 7X10X7cm Wound bed: 90% beefy red, 10% yellow interspersed throughout Drainage (amount, consistency, odor) Mod amt pink drainage, no odor Periwound: Intact skin surrounding Dressing procedure/placement/frequency:  Applied one piece of black foam to 115m cont suction; pt tolerated with mod amt discomfort after pain meds were given earlier.  Pt plans to discharge with home health assistance when Vac approved for use after discharge. Plan for bedside nurse to change Q Tues/Thurs/Sat. Please re-consult if further assistance is needed.  Thank-you,  DJulien GirtMSN, [place]

## 2016-11-20 NOTE — Care Management Note (Addendum)
Case Management Note  Patient Details  Name: Suzanne Macdonald MRN: [id number] Date of Birth: 09-14-1950  Subjective/Objective:   Pt admitted on 11/16/16 with recurrent abdominal wall abscess with surrounding cellulitis.  PTA, pt resided at home with spouse.  She has a hx of [place] services with [place].                 Action/Plan: Pt will required dressing changes at discharge with packing.  She states husband willing and able to assist with wound care.  She prefers [facility] for [facility] [employer] [facility] follow up.  Referral to [facility] for [facility] services.  Start of care 24-48h post dc date.    Expected Discharge Date:  11/20/16               Expected Discharge Plan:  [place]  In-House Referral:     Discharge planning Services  CM Consult  Post Acute Care Choice:  Home Health Choice offered to:  Patient  DME Arranged:    DME Agency:     HH Arranged:  RN [place] Agency:  [place]  Status of Service:  Completed, signed off  If discussed at [place] of Stay Meetings, dates discussed:    Additional Comments: 11/20/16 J. Anaiya Wisinski, RN, BSN Notified that pt will now need wound VAC for home.  VAC applied at bedside by [facility] nurse.   Completed and faxed [employer] insurance authorization form to [employer] per protocol as rush order,as pt may dc later today if VAC able to be obtained.  Notified [facility] of VAC dressing changes now needed for home.  Will follow with updates as available.    11/20/16 J. Florabel Faulks, RN, BSN Pt's address is not in the coverage area for [employer]; must go through 3rd party [employer] for wound VAC.  Spoke with [employer] [phone]) at 4pm; they state wound VAC likely to be approved this evening and delivered, but they will not secure a time for delivery.  Gave representative phone # for unit and advised to ask for pt's bedside nurse upon VAC release.  Notified bedside nurse of update.    Reinaldo Raddle, RN, BSN  Trauma/Neuro ICU Case Manager [phone]

## 2016-11-20 NOTE — Progress Notes (Signed)
Medicine attending: I examined this patient today and I concur with evaluation and management plan as recorded by resident physician Dr. Candace Cruise which we discussed together. Large open abdominal wound healing by second intention.  No surrounding erythema.  She remains afebrile.  Blood cultures negative at 72 hours.  Wound cultures growing Klebsiella.  Sensitivities pending but sensitivities on Klebsiella grown at time of most recent infection showed sensitivity to cephalosporins.  Pending further input from surgery, we would favor changing her to oral cephalexin and likely discharge later today.  Wound VAC to be placed.  Addendum: Sensitivities now available and the Klebsiella remains sensitive to cephalosporins.  Proceed with plan above.

## 2016-11-20 NOTE — Progress Notes (Addendum)
   Subjective: Patient is doing well this morning. Afebrile and no events overnight. She is eager for discharge home.   Objective:  Vital signs in last 24 hours: Vitals:   11/19/16 0937 11/19/16 1545 11/19/16 1952 11/20/16 0458  BP:  (!) 149/54 (!) 144/51 (!) 153/71  Pulse:  (!) 58 61 61  Resp:   19 19  Temp:  98.2 F (36.8 C) 98.3 F (36.8 C) 98.4 F (36.9 C)  TempSrc:  Oral Oral Oral  SpO2: 95% 97% 96% 98%  Weight:      Height:       Physical Exam: Constitutional: NAD, appears comfortable Cardiovascular:RRR Pulmonary/Chest:CTAB  Abdominal:Soft, non tender, non distended. Hypoactive bowel sounds. Surgical site well wrapped, c/d/i.  Extremities: Warm and well perfused.  Neurological:A&Ox3, CN II - XII grossly intact.   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 She has been treated with multiple I&Ds + Abx in the past. She presented to the ED c/o worsening right abdominal wall pain,purulent discharge from her wound, and fever with chills for 4 days.  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 Cultures in the past positive for Klebsiella, Enterococcus, E. coli. s/pcefazolin in ED, now on Unasyn. S/p I&D and surgical debridement 11/18/16. Afebrile overnight. Surgical cultures again grew klebsiella, sensitivities pending. Surgery planning for negative pressure dressing today, then hopefully discharge. - Surgery c/s, appreciate recs - Change Unasyn 3g IV q6h to PO Keflex TID based on prior sensitives; will f/u final culture and sensitivity report  - Norco 5-'325mg'$  q4h PRN pain - Toradol '15mg'$  IV PRN severe pain - Zofran PRN N/V  PAF. AC w/ Eliquis '5mg'$  BID, rhythm control w/ flecainide '100mg'$   BID, rate control w/ Coreg 18.'75mg'$  BID + dilt XR '120mg'$  daily. Held [facility] for I&D. - Continue home meds for rate/rhythmcontrol - Continue Eliquis '5mg'$  BID   Hypertension:Continue home meds: lisinopril '5mg'$  qD + Coreg/Dilt as above.  Asthma.Continue homeSymbicort and albuterol PRN.  Hyperlipidemia.Continue home Lipitor '10mg'$  qD.  FEN: No fluids, replete lytes prn, carb mod diet  VTE ppx: Resume Eliquis today  Code Status: FULL  Dispo: Anticipated discharge today pending final surgery recs and plans for follow up.   Suzanne Ochs, MD 11/20/2016, 7:13 AM Pager: [phone]

## 2016-11-20 NOTE — Progress Notes (Signed)
Patient ID: Suzanne Macdonald, female   DOB: 1950-10-02, 66 y.o.   MRN: [id number]  [facility] Surgery Progress Note  2 Days Post-Op  Subjective: Tolerating dressing changes. Plan to apply wound vac later today. Tolerating diet and pain is well controlled. Hopes to go home today.  Objective: Vital signs in last 24 hours: Temp:  [98.2 F (36.8 C)-98.4 F (36.9 C)] 98.4 F (36.9 C) (04/03 0458) Pulse Rate:  [58-61] 61 (04/03 0458) Resp:  [19] 19 (04/03 0458) BP: (144-153)/(51-71) 153/71 (04/03 0458) SpO2:  [95 %-98 %] 98 % (04/03 0458) Last BM Date: 11/18/16  Intake/Output from previous day: 04/02 0701 - 04/03 0700 In: 1102 [P.O.:702; IV Piggyback:400] Out: -  Intake/Output this shift: No intake/output data recorded.  PE: Gen:  Alert, NAD, pleasant Pulm: effort normal Abd: obese, soft, NT/ND, open wound right lower flank clean with no purulent drainage, bloody drainage, or surrounding erythema  Lab Results:   Recent Labs  11/19/16 0357 11/20/16 0745  WBC 6.8 5.6  HGB 9.2* 9.6*  HCT 30.2* 30.8*  PLT 220 252   BMET  Recent Labs  11/19/16 0357  NA 139  K 3.8  CL 105  CO2 24  GLUCOSE 96  BUN 15  CREATININE 0.78  CALCIUM 8.2*   PT/INR No results for input(s): LABPROT, INR in the last 72 hours. CMP     Component Value Date/Time   NA 139 11/19/2016 0357   NA 143 01/24/2016 0952   K 3.8 11/19/2016 0357   CL 105 11/19/2016 0357   CO2 24 11/19/2016 0357   GLUCOSE 96 11/19/2016 0357   BUN 15 11/19/2016 0357   BUN 19 01/24/2016 0952   CREATININE 0.78 11/19/2016 0357   CALCIUM 8.2 (L) 11/19/2016 0357   PROT 6.1 (L) 11/16/2016 1347   PROT 6.3 01/24/2016 0952   ALBUMIN 2.9 (L) 11/16/2016 1347   ALBUMIN 3.9 01/24/2016 0952   AST 19 11/16/2016 1347   ALT 21 11/16/2016 1347   ALKPHOS 73 11/16/2016 1347   BILITOT 0.9 11/16/2016 1347   BILITOT 0.3 01/24/2016 0952   GFRNONAA >60 11/19/2016 0357   GFRAA >60 11/19/2016 0357   Lipase     Component Value  Date/Time   LIPASE 21 08/09/2016 2313       Studies/Results: No results found.  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 Excell Seltzer; IR drain 06/28/16  Atrial fibrillation on chronic anticoagulation - ok to resume eliquis Hypertension Asthma OSA Body mass index is 47.19  Down from 31 in January   S/p debridement of abdominal wall abscess, subcutaneous tissues and fascia, final wound 10x10cm, 8cm deep, 11/18/16, Dr. Romana Juniper  POD 2 - culture growing FEW [place], susceptibilities pending - WBC WNL - appreciate WOC consult, planning to convert to wound vac today  FEN:  Carb Modified CN:[id number] 3/30 x 1;  Unasyn 3/30 =>>  day 5 DVT: SCD   Plan - Wound vac to be applied today. Culture growing klebsiella, susceptibilities pending. HH for vac changes. Hopefully home today if [facility] and supplies can be arranged; if not today, then tomorrow. She will need to go home on PO antibiotics and f/u with Dr. Excell Seltzer   LOS: 4 days    Jerrye Beavers , [facility] Surgery 11/20/2016, 9:04 AM Pager: [phone] Consults: [phone] Mon-Fri 7:00 am-4:30 pm Sat-Sun 7:00 am-11:30 am

## 2016-11-20 NOTE — Care Management Important Message (Signed)
Important Message  Patient Details  Name: Suzanne Macdonald MRN: [id number] Date of Birth: February 17, 1951   Medicare Important Message Given:  Yes    Jeffery Gammell 11/20/2016, 2:32 PM

## 2016-11-20 NOTE — Care Management Important Message (Signed)
Important Message  Patient Details  Name: Suzanne Macdonald MRN: [id number] Date of Birth: 12-21-1950   Medicare Important Message Given:  Yes    Wade Sigala [address] 11/20/2016, 2:34 PM

## 2016-11-20 NOTE — Discharge Summary (Signed)
Name: Suzanne Macdonald MRN: [id number] DOB: 07-02-1951 66 y.o. PCP: Bartholomew Crews, MD  Date of Admission: 11/16/2016  3:52 PM Date of Discharge: 11/20/2016 Attending Physician: Annia Belt, MD  Discharge Diagnosis: 1. Right Abdominal Wall Abcess  Discharge Medications: Allergies as of 11/20/2016      Reactions   Vancomycin Shortness Of Breath, Other (See Comments), Cough   Pt began coughing and c/o tightening of chest/difficulty breathing and flushing during Vanc infusion.  Reports she never wants to have Vancomycin again even if it can be prevented with rate reduction   Scallops [shellfish Allergy] Hives   Augmentin [amoxicillin-pot Clavulanate] Diarrhea   Barium-containing Compounds Other (See Comments)   Feels like cement in the body      Medication List    TAKE these medications   acetaminophen 500 MG tablet Commonly known as:  TYLENOL Take 1,000 mg by mouth every 6 (six) hours as needed for mild pain or moderate pain.   TYLENOL ARTHRITIS PAIN 650 MG CR tablet Generic drug:  acetaminophen Take 650 mg by mouth every 8 (eight) hours as needed for pain.   albuterol 108 (90 Base) MCG/ACT inhaler Commonly known as:  PROVENTIL HFA;VENTOLIN HFA Inhale 1-2 puffs into the lungs every 6 (six) hours as needed for wheezing or shortness of breath.   ALIVE WOMENS GUMMY Chew Chew 2 each by mouth daily.   apixaban 5 MG Tabs tablet Commonly known as:  ELIQUIS Take 1 tablet (5 mg total) by mouth 2 (two) times daily.   atorvastatin 10 MG tablet Commonly known as:  LIPITOR take 1 tablet by mouth once daily   budesonide-formoterol 160-4.5 MCG/ACT inhaler Commonly known as:  SYMBICORT Inhale 2 puffs into the lungs 2 (two) times daily. What changed:  when to take this  reasons to take this   CALCIUM/VITAMIN D3/ADULT GUMMY PO Take 2 each by mouth daily.   carvedilol 6.25 MG tablet Commonly known as:  COREG Take 3 tablets (18.75 mg total) by mouth 2 (two) times daily  with a meal.   cephALEXin 500 MG capsule Commonly known as:  KEFLEX Take 1 capsule (500 mg total) by mouth 3 (three) times daily.   cetirizine 10 MG tablet Commonly known as:  ZYRTEC Take 10 mg by mouth daily.   cyanocobalamin 500 MCG tablet Take 1,000 mcg by mouth daily.   diltiazem 120 MG 24 hr capsule Commonly known as:  CARDIZEM CD Take 1 capsule (120 mg total) by mouth daily.   diltiazem 30 MG tablet Commonly known as:  CARDIZEM Take 1 tablet every 4 hours AS NEEDED for AFIB fast heart rate >100 as long as blood pressure >100.   docusate sodium 100 MG capsule Commonly known as:  COLACE Take 1 capsule (100 mg total) by mouth 2 (two) times daily as needed for mild constipation.   ferrous sulfate 325 (65 FE) MG tablet Take 1 tablet (325 mg total) by mouth daily with breakfast.   flecainide 100 MG tablet Commonly known as:  TAMBOCOR take 1 tablet by mouth twice a day   HYDROcodone-acetaminophen 5-325 MG tablet Commonly known as:  NORCO/VICODIN Take 1-2 tablets by mouth every 6 (six) hours as needed for moderate pain.   lisinopril 5 MG tablet Commonly known as:  PRINIVIL,ZESTRIL take 1 tablet by mouth once daily   loperamide 2 MG capsule Commonly known as:  IMODIUM Take 2 mg by mouth daily as needed for diarrhea or loose stools.   omeprazole 40 MG capsule Commonly known  as:  PRILOSEC Take 1 capsule (40 mg total) by mouth daily.   potassium chloride 10 MEQ tablet Commonly known as:  K-DUR 2 TABLETS BY MOUTH DAILY What changed:  how much to take  how to take this  when to take this  additional instructions   pregabalin 75 MG capsule Commonly known as:  LYRICA Take 1 capsule (75 mg total) by mouth at bedtime. Take 1 to 3 hours before bedtime   traZODone 100 MG tablet Commonly known as:  DESYREL Take 1 tablet (100 mg total) by mouth at bedtime.   Vitamin D3 2000 units Chew Chew 2,000 Units by mouth 2 (two) times daily.       Disposition and  follow-up:   [name] was discharged from [facility] in Stable condition.  At the hospital follow up visit please address:  1.  Right Abdominal Wall Abscess: Patient discharged on a 10 day course of Keflex with wound vac in place. Plan to follow up with surgery in 2 weeks.   2.  Labs / imaging needed at time of follow-up: None   3.  Pending labs/ test needing follow-up: None   Follow-up Appointments: Follow-up Information    HOXWORTH,BENJAMIN T, MD. Schedule an appointment as soon as possible for a visit in 2 week(s).   Specialty:  General Surgery Why:  Please call as soon as you leave to make a follow-up appointment in 2 weeks with Dr. Excell Seltzer. Contact information: [address] [place] [address] [phone]           Hospital Course by problem list:  1. Right Abdominal Wall Abscess and Cellulitis: Patient is a 66 year old female with a past medical history of morbid obesity, paroxysmal A. fib on Eliquis, hypertension, colon cancer, gastric bypass, incarcerated hernia repair in 2017 which has been complicated by recurrent abdominal wall abscesses (most recent in February 2018) who presented to the ED with worsening right-sided abdominal pain and purulent drainage over the last 4 days. On admission she was febrile to 102 with a mild leukocytosis of 11.8. CT abdomen and pelvis showed an abscess surrounding cellulitis at her prior surgical site. She was started on Unasyn based on prior cultures that grew Klebsiella pneumonia. General surgery was consulted and she was taken to the OR on 11/18/2016 for I&D and extensive debridement of the area. Patient tolerated surgery well without complication. Surgical cultures again grew Klebsiella, resistant to ampicillin and sensitive to cephalosporins. She was discharged on Keflex 3 times a day for 10 days. Negative pressure dressing was applied to the area and she was discharged with a wound VAC in place. Plan  to follow-up with surgery in 2 weeks.  Discharge Vitals:   BP (!) 169/74 (BP Location: Left Arm)   Pulse 64   Temp 98.4 F (36.9 C) (Oral)   Resp 18   Ht '5\' 2"'$  (1.575 m)   Wt 258 lb (117 kg)   SpO2 96%   BMI 47.19 kg/m   Pertinent Labs, Studies, and Procedures:   11/16/2016 CT abdomen and pelvis with contrast IMPRESSION: 1. 4.3 x 7.8 cm right lateral anterior abdominal wall abscess with surrounding cellulitis.  Discharge Instructions: Discharge Instructions    Call MD for:  persistant nausea and vomiting    Complete by:  As directed    Call MD for:  redness, tenderness, or signs of infection (pain, swelling, redness, odor or green/yellow discharge around incision site)    Complete by:  As  directed    Call MD for:  severe uncontrolled pain    Complete by:  As directed    Call MD for:  temperature >100.4    Complete by:  As directed    Diet - low sodium heart healthy    Complete by:  As directed    Discharge instructions    Complete by:  As directed    [name],  It was a pleasure taking care of you. I have sent a prescription for an antibiotic to your pharmacy. Please take Keflex three times daily for the next 10 days. Please follow up with your primary care provider on Thursday for your scheduled appointment. You will also need to follow up with General Surgery Dr. Excell Seltzer in 2 weeks. Please give their office a call to schedule this appointment. Please continue taking all of your other medications as previously prescribed. If you have any questions or concerns, call our clinic at [phone] or after hours call [phone] and ask for the internal medicine resident on call. Thank you!  - Dr. Philipp Ovens   Increase activity slowly    Complete by:  As directed       Signed: Velna Ochs, MD 11/20/2016, 11:44 AM   Pager: [phone]

## 2016-11-20 NOTE — Discharge Instructions (Signed)
Negative Pressure Wound Therapy What is negative pressure wound therapy? Negative pressure wound therapy (NPWT) is a device that helps your wounds heal. NPWT helps your wound stay clean and healthy while it heals from the inside. NPWT uses a bandage (dressing) that is made of a sponge or gauze-like material. This dressing is placed on or inside the wound. The wound is then covered and sealed with a cover dressing that sticks to your skin (adhesive). This keeps air out. A tube connects the cover dressing to a small pump. The pump sucks fluid and germs from the wound. The pump also controls any odor coming from the wound. What are the benefits of NPWT? The benefits of NPWT may include:  Faster healing.  Lower risk of infection.  Decrease in swelling and how much fluid is in the wound.  Fewer dressing changes.  Ability to treat your wound at home.  Shorter hospital stay.  Less pain. What are the risks of NPWT? NPWT is usually safe to use. The most common problem is skin irritation from the dressing adhesive, but there are many ways to help prevent this from happening. However, more serious problems can develop, such as:  Bleeding.  Infection.  Dehydration.  Pain. What do I need to do to care for my wound?  Do not take off the dressing yourself unless told to do so by your health care provider.  Keep all follow-up visits as told by your health care provider. This is important.  Make sure you know how to change your dressing, if you will be doing this at home.  Keep the area clean and dry.  Ask your health care provider for the best way to protect your skin from becoming irritated by the adhesive. What do I need to know about the pump?  Do not turn off the pump yourself unless told to do so by your health care provider, such as for bathing.  Do not turn off the pump for more than two hours. If the pump is off for more than two hours, the dressing will need to be changed.  If  your health care provider says it is okay to shower:  Do not take the pump into the shower.  Make sure the wound dressing is protected and sealed. The wound area must stay dry.  Check frequently that the machine is on, that the machine indicates the therapy is on, and that all clamps are open.  If the alarm sounds:  Stay calm.  Do not turn off the pump or do anything with the dressing.  Call your health care provider right away if you cannot fix the problem. The alarm may go off because the battery is low, the dressing has a leak, or the fluid collection container is full.  Explain to your health care provider what is happening. Follow his or her instructions. When should I seek medical care? Seek medical care if:  You have new pain.  You develop irritation, a rash, or itching around the wound or dressing.  You see new black or yellow tissue in your wound.  The dressing changes are painful or cause bleeding.    The pump alarm goes off and you do not know what to do. When should I seek immediate medical care? Seek immediate medical care if:  You have a lot of bleeding.  You see a sudden change in the color or texture of the drainage.  The wound breaks open.  You have severe pain.  You have signs of infection, such as:  More redness, swelling, or pain.  More fluid or blood.  Warmth.  Pus or a bad smell.  Red streaks leading from wound.  A fever. This information is not intended to replace advice given to you by your health care provider. Make sure you discuss any questions you have with your health care provider. Document Released: 07/19/2008 Document Revised: 07/03/2016 Document Reviewed: 05/12/2015 Elsevier Interactive Patient Education  2017 [employer].

## 2016-11-21 DIAGNOSIS — Z91041 Radiographic dye allergy status: Secondary | ICD-10-CM

## 2016-11-21 DIAGNOSIS — Z91013 Allergy to seafood: Secondary | ICD-10-CM

## 2016-11-21 DIAGNOSIS — Z6841 Body Mass Index (BMI) 40.0 and over, adult: Secondary | ICD-10-CM

## 2016-11-21 DIAGNOSIS — B961 Klebsiella pneumoniae [K. pneumoniae] as the cause of diseases classified elsewhere: Secondary | ICD-10-CM

## 2016-11-21 DIAGNOSIS — Z9889 Other specified postprocedural states: Secondary | ICD-10-CM

## 2016-11-21 DIAGNOSIS — Z8719 Personal history of other diseases of the digestive system: Secondary | ICD-10-CM

## 2016-11-21 DIAGNOSIS — Z85038 Personal history of other malignant neoplasm of large intestine: Secondary | ICD-10-CM

## 2016-11-21 DIAGNOSIS — Z881 Allergy status to other antibiotic agents status: Secondary | ICD-10-CM

## 2016-11-21 DIAGNOSIS — Z1611 Resistance to penicillins: Secondary | ICD-10-CM

## 2016-11-21 LAB — CULTURE, BLOOD (ROUTINE X 2)
CULTURE: NO GROWTH
CULTURE: NO GROWTH

## 2016-11-21 NOTE — Progress Notes (Addendum)
Subjective: Discharge orders placed yesterday unfortunately supplies for wound vac were not delivered. Patient is doing well, no events overnight. Eager for discharge.   Objective:  Vital signs in last 24 hours: Vitals:   11/20/16 2117 11/20/16 2201 11/21/16 0457 11/21/16 0831  BP:  (!) 148/55 135/61   Pulse:  (!) 55 61   Resp:  19 19   Temp:  98.2 F (36.8 C) 98 F (36.7 C)   TempSrc:  Oral Oral   SpO2: 94% 98% 98% 99%  Weight:      Height:       Physical Exam: Constitutional: NAD, appears comfortable Cardiovascular:RRR Pulmonary/Chest:CTAB  Abdominal:Soft, non tender, non distended. Hypoactive bowel sounds. Negative pressure dressing in place, c/d/i.  Extremities: Warm and well perfused.  Neurological:A&Ox3, CN II - XII grossly intact.   Assessment/Plan:  Lesette Frary a 66 y.o.lady with PMHx significant for morbid obesity, PAF (Eliquis), HTN, colon CA, gastric bypass, incarcerated hernia s/p repair (Sept 8101) 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 She has been treated with multiple I&Ds + Abx in the past. She presented to the ED c/o worsening right abdominal wall pain,purulent discharge from her wound, and fever with chills for 4 days.  Recurrent right abdominal wall cellulitis and abscess.CT demonstratedrecurrent abcess. Cultures in the past positive for Klebsiella, Enterococcus, E. coli. s/pcefazolin in ED, Unasyn changed to keflex yesterday. S/p I&D and surgical debridement 11/18/16. Afebrile overnight. Surgical cultures again grew klebsiella, resistant to ampicillin, sensitive to cephalosporins. Negative pressure dressing applied yesterday, waiting for wound vac then discharge. - Surgery c/s, appreciate recs - Continue Keflex TID  X 10 days  - Norco 5-'325mg'$  q4h PRN pain - Toradol '15mg'$  IV PRN severe pain - Zofran PRN N/V  PAF. AC w/ Eliquis '5mg'$  BID, rhythm control w/ flecainide '100mg'$  BID,  rate control w/ Coreg 18.'75mg'$  BID + dilt XR '120mg'$  daily. Held [facility] for I&D. - Continue home meds for rate/rhythmcontrol - Continue Eliquis '5mg'$  BID  Hypertension:Continue home meds: lisinopril '5mg'$  qD + Coreg/Dilt as above.  Asthma.Continue homeSymbicort and albuterol PRN.  Hyperlipidemia.Continue home Lipitor '10mg'$  qD.  FEN: No fluids, replete lytes prn, carb mod diet  VTE ppx: Resume Eliquis today  Code Status: FULL   Dispo: Anticipated discharge today. Follow up with PCP scheduled for tomorrow. Follow up with surgery in 2 weeks.  Suzanne Ochs, MD 11/21/2016, 9:55 AM Pager: [phone]  Medicine attending discharge note: I personally examined this patient on the day of discharge and I attest to the accuracy of the discharge evaluation and plan as recorded by resident physician Dr. Candace Cruise.  Note: Planned discharge delayed until the next morning April 5 since durable medical equipment not yet delivered to the patient's home.  Clinical summary: Pleasant, morbidly obese, 66 year old woman with episodes of recurrent abdominal wall cellulitis following multiple prior abdominal surgeries and issues with delayed wound healing.  She presented at the current time with recurrent right lower quadrant abdominal pain, purulent discharge from her wound, and fever.  CT scan consistent with recurrent abscess.  She was seen in consultation by surgery.  Cultures obtained and antibiotics started.  Incision and drainage procedure performed on April 1.  Single low grade temperature 100.2 on March 28.  Spiked to 101.6 on March 30.  Persistent intermittent fever to 102.1 on March 31.  She then defervesced and remained afebrile for the duration of the hospital course. Peak white blood count 12,800 normalized to 5600  near time of discharge April 3. Blood cultures remained sterile at 72 hours.  Wound cultures grew recurrent Klebsiella sensitive to cephalosporins.  She was transitioned to oral  cephalexin at discharge.  A wound VAC device was placed.  Disposition: Condition stable at time of discharge She will follow-up in our medicine clinic tomorrow and with surgery in 2 weeks. There were no complications

## 2016-11-21 NOTE — Progress Notes (Signed)
3 Days Post-Op  Subjective: CC:  Recurrent abdominal wall abscess Wound vac in place, and she is packed to go waiting on home Health arrangements to be completed.     Objective: Vital signs in last 24 hours: Temp:  [98 F (36.7 C)-98.2 F (36.8 C)] 98 F (36.7 C) (04/04 0457) Pulse Rate:  [53-61] 61 (04/04 0457) Resp:  [18-19] 19 (04/04 0457) BP: (135-148)/(52-61) 135/61 (04/04 0457) SpO2:  [94 %-99 %] 99 % (04/04 0831) Last BM Date: 11/20/16  Intake/Output from previous day: No intake/output data recorded. Intake/Output this shift: No intake/output data recorded.  General appearance: alert, cooperative and no distress GI: soft, sore wound vac in place, tolerating diet.  Lab Results:   Recent Labs  11/19/16 0357 11/20/16 0745  WBC 6.8 5.6  HGB 9.2* 9.6*  HCT 30.2* 30.8*  PLT 220 252    BMET  Recent Labs  11/19/16 0357  NA 139  K 3.8  CL 105  CO2 24  GLUCOSE 96  BUN 15  CREATININE 0.78  CALCIUM 8.2*   PT/INR No results for input(s): LABPROT, INR in the last 72 hours.   Recent Labs Lab 11/14/16 2124 11/16/16 1347  AST 26 19  ALT 24 21  ALKPHOS 102 73  BILITOT 0.3 0.9  PROT 6.2* 6.1*  ALBUMIN 3.3* 2.9*     Lipase     Component Value Date/Time   LIPASE 21 08/09/2016 2313     Studies/Results: No results found.  Medications: . apixaban  5 mg Oral BID  . atorvastatin  10 mg Oral Daily  . carvedilol  18.75 mg Oral BID WC  . cephALEXin  500 mg Oral Q8H  . diltiazem  120 mg Oral Daily  . feeding supplement (PRO-STAT SUGAR FREE 64)  30 mL Oral BID  . ferrous sulfate  325 mg Oral Q breakfast  . flecainide  100 mg Oral BID  . lisinopril  5 mg Oral Daily  . loratadine  10 mg Oral Daily  . mometasone-formoterol  2 puff Inhalation BID  . pantoprazole  40 mg Oral Daily  . pregabalin  75 mg Oral QHS  . traZODone  100 mg Oral QHS  . 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 Excell Seltzer; IR drain 06/28/16  Atrial fibrillation on chronic anticoagulation - ok to resume eliquis Hypertension Asthma OSA Body mass index is 47.19 Down from 59 in January   S/p debridement of abdominal wall abscess, subcutaneous tissues and fascia, final wound 10x10cm, 8cm deep, 11/18/16, Dr. 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 Excell Seltzer; IR drain 06/28/16  Atrial fibrillation on chronic anticoagulation - ok to resume eliquis Hypertension Asthma OSA Body mass index is 47.19 Down from 72 in January   S/p debridement of abdominal wall abscess, subcutaneous tissues and fascia, final wound 10x10cm, 8cm deep, 11/18/16, Dr. Romana Juniper POD 2 - culture growing FEW KLEBSIELLA PNEUMONIAE, susceptibilities pending - WBC WNL - appreciate WOC consult, planning to convert to wound  vac today  FEN: Carb Modified TR:[id number] 3/30 x 1; Unasyn 3/30 =>>day 5 >> Keflex  11/20/16 =>> day 2 DVT: SCD   Plan: follow up in the office with Dr. Excell Seltzer 2 weeks.  Antibiotics per Medicine.  Negative wound Rx at home.       LOS: 5 days    Suzanne Macdonald 11/21/2016 [phone]

## 2016-11-21 NOTE — Progress Notes (Signed)
Pt d/c home per MD order, pt VSS, pt husband at [facility], d/c instructions given all Questions answered, pt and husband verbalized understanding of d/c

## 2016-11-21 NOTE — Care Management Note (Signed)
Case Management Note  Patient Details  Name: Suzanne Macdonald MRN: [id number] Date of Birth: Aug 12, 1951  Subjective/Objective:   Pt admitted on 11/16/16 with recurrent abdominal wall abscess with surrounding cellulitis.  PTA, pt resided at home with spouse.  She has a hx of [place] services with [facility].                 Action/Plan: Pt will required dressing changes at discharge with packing.  She states husband willing and able to assist with wound care.  She prefers [facility] for [facility] follow up.  Referral to [facility] for [facility] services.  Start of care 24-48h post dc date.    Expected Discharge Date:  11/21/16               Expected Discharge Plan:  [place]  In-House Referral:     Discharge planning Services  CM Consult  Post Acute Care Choice:  Home Health Choice offered to:  Patient  DME Arranged:    DME Agency:     HH Arranged:  RN [place] Agency:  [place]  Status of Service:  Completed, signed off  If discussed at [place] of Stay Meetings, dates discussed:    Additional Comments: 11/20/16 J. Raima Geathers, RN, BSN Notified that pt will now need wound VAC for home.  VAC applied at bedside by [facility] nurse.   Completed and faxed [employer] insurance authorization form to [employer] per protocol as rush order,as pt may dc later today if VAC able to be obtained.  Notified [facility] of VAC dressing changes now needed for home.  Will follow with updates as available.    11/20/16 J. Lizzet Hendley, RN, BSN Pt's address is not in the coverage area for [employer]; must go through 3rd party [employer] for wound VAC.  Spoke with [employer] [phone]) at 4pm; they state wound VAC likely to be approved this evening and delivered, but they will not secure a time for delivery.  Gave representative phone # for unit and advised to ask for pt's bedside nurse upon VAC release.  Notified bedside nurse of update.    11/21/16 J. Maggy Wyble, RN, BSN Notified by [employer] that wound VAC approved and will be delivered to  [employer] by 1pm today.  Purchase order # [id number].  Pt and bedside nurse notified of delivery information.    Reinaldo Raddle, RN, BSN  Trauma/Neuro ICU Case Manager [phone]

## 2016-11-22 ENCOUNTER — Ambulatory Visit (INDEPENDENT_AMBULATORY_CARE_PROVIDER_SITE_OTHER): Payer: Medicare Other | Admitting: Internal Medicine

## 2016-11-22 ENCOUNTER — Encounter: Payer: Self-pay | Admitting: Internal Medicine

## 2016-11-22 VITALS — BP 152/71 | HR 50 | Temp 97.7°F | Wt 291.9 lb

## 2016-11-22 DIAGNOSIS — I10 Essential (primary) hypertension: Secondary | ICD-10-CM

## 2016-11-22 DIAGNOSIS — Z9221 Personal history of antineoplastic chemotherapy: Secondary | ICD-10-CM

## 2016-11-22 DIAGNOSIS — G4733 Obstructive sleep apnea (adult) (pediatric): Secondary | ICD-10-CM | POA: Diagnosis not present

## 2016-11-22 DIAGNOSIS — Z8541 Personal history of malignant neoplasm of cervix uteri: Secondary | ICD-10-CM | POA: Diagnosis not present

## 2016-11-22 DIAGNOSIS — L600 Ingrowing nail: Secondary | ICD-10-CM | POA: Diagnosis not present

## 2016-11-22 DIAGNOSIS — D509 Iron deficiency anemia, unspecified: Secondary | ICD-10-CM

## 2016-11-22 DIAGNOSIS — Z9884 Bariatric surgery status: Secondary | ICD-10-CM | POA: Diagnosis not present

## 2016-11-22 DIAGNOSIS — L02211 Cutaneous abscess of abdominal wall: Secondary | ICD-10-CM

## 2016-11-22 DIAGNOSIS — Z9889 Other specified postprocedural states: Secondary | ICD-10-CM

## 2016-11-22 DIAGNOSIS — R197 Diarrhea, unspecified: Secondary | ICD-10-CM | POA: Diagnosis not present

## 2016-11-22 DIAGNOSIS — Z79899 Other long term (current) drug therapy: Secondary | ICD-10-CM | POA: Diagnosis not present

## 2016-11-22 DIAGNOSIS — M545 Low back pain: Secondary | ICD-10-CM | POA: Diagnosis not present

## 2016-11-22 DIAGNOSIS — G4700 Insomnia, unspecified: Secondary | ICD-10-CM

## 2016-11-22 DIAGNOSIS — G2581 Restless legs syndrome: Secondary | ICD-10-CM

## 2016-11-22 DIAGNOSIS — I480 Paroxysmal atrial fibrillation: Secondary | ICD-10-CM

## 2016-11-22 DIAGNOSIS — Z85038 Personal history of other malignant neoplasm of large intestine: Secondary | ICD-10-CM

## 2016-11-22 DIAGNOSIS — Z7901 Long term (current) use of anticoagulants: Secondary | ICD-10-CM | POA: Diagnosis not present

## 2016-11-22 DIAGNOSIS — J45909 Unspecified asthma, uncomplicated: Secondary | ICD-10-CM | POA: Diagnosis not present

## 2016-11-22 DIAGNOSIS — B961 Klebsiella pneumoniae [K. pneumoniae] as the cause of diseases classified elsewhere: Secondary | ICD-10-CM | POA: Diagnosis not present

## 2016-11-22 DIAGNOSIS — B9689 Other specified bacterial agents as the cause of diseases classified elsewhere: Secondary | ICD-10-CM

## 2016-11-22 DIAGNOSIS — Z7951 Long term (current) use of inhaled steroids: Secondary | ICD-10-CM | POA: Diagnosis not present

## 2016-11-22 DIAGNOSIS — Z87891 Personal history of nicotine dependence: Secondary | ICD-10-CM | POA: Diagnosis not present

## 2016-11-22 DIAGNOSIS — F5101 Primary insomnia: Secondary | ICD-10-CM

## 2016-11-22 DIAGNOSIS — Z6841 Body Mass Index (BMI) 40.0 and over, adult: Secondary | ICD-10-CM

## 2016-11-22 DIAGNOSIS — G8929 Other chronic pain: Secondary | ICD-10-CM | POA: Diagnosis not present

## 2016-11-22 DIAGNOSIS — L03311 Cellulitis of abdominal wall: Secondary | ICD-10-CM | POA: Diagnosis not present

## 2016-11-22 DIAGNOSIS — Z0000 Encounter for general adult medical examination without abnormal findings: Secondary | ICD-10-CM

## 2016-11-22 DIAGNOSIS — T814XXD Infection following a procedure, subsequent encounter: Secondary | ICD-10-CM | POA: Diagnosis not present

## 2016-11-22 MED ORDER — LISINOPRIL 10 MG PO TABS: 10.0000 mg | ORAL_TABLET | Freq: Every day | ORAL | 3 refills | Status: AC

## 2016-11-22 MED ORDER — PREGABALIN 150 MG PO CAPS: 150.0000 mg | ORAL_CAPSULE | Freq: Every day | ORAL | 3 refills | Status: DC

## 2016-11-22 NOTE — Assessment & Plan Note (Signed)
This problem is chronic and stable. She is rate controlled on flecainide and Cardizem. She is also on Eliquis. Heart rate today is 50.  PLAN:  Cont current meds

## 2016-11-22 NOTE — Assessment & Plan Note (Signed)
This problem is chronic and improved. At her last appointment, I started pregabalin as 75 mg. She has had good response to this which includes a resting better, feeling less like her nerve endings are screaming, and less osteoarthritis pain. She wonders if the dose can go up to get an even more robust response. She is having no side effects to the medication.  Plan : increase pregabalin 150

## 2016-11-22 NOTE — Patient Instructions (Addendum)
1. Take 2 lyrica. When you get new bottle, it will be a higher dose so then take one 2. Call me if diarrhea, stomach pain gets worse or you get a fever. You are at risk for C diff. 3. Take 2 lisinopril at once. When you get new bottle, take one

## 2016-11-22 NOTE — Assessment & Plan Note (Signed)
Please see assessment and plan under restless leg syndrome

## 2016-11-22 NOTE — Assessment & Plan Note (Signed)
This problem is chronic and untreated. She was not able to keep the appointment for the toenail removal. She states it is a little bit better but she knows she needs to get it addressed. Right now, she has many other medical issues that are taking precedence and she would like to hold off on any intervention for now.  Plan : She will let me know when she is ready for intervention

## 2016-11-22 NOTE — Assessment & Plan Note (Signed)
This problem is chronic and stable. She will need a repeat ferritin as it was 36 the last time it was checked. However she recently had an abscess and a ferritin could be artificially elevated so I am not checking it today. Her baseline hemoglobin is 8. She is on oral iron.  Plan: Check ferritin once she has no active infection.

## 2016-11-22 NOTE — Assessment & Plan Note (Signed)
New problem : Diarrhea and abdominal pain She currently is on Keflex for the abdominal abscess that was tub righted and currently has a wound VAC. She is having diarrhea and abdominal pain since this. I discussed with her that she is at high risk for C. difficile and I gave her written information along with a discussion of warning signs and how to get in touch with me including the after hours pager. For now, I think her diarrhea and abdominal pain is due to the antibiotic and post surgical state. There is no indication to check a C. difficile at this time.  Plan : Information on C. difficile given She is to call me if any of her symptoms change or worsen.

## 2016-11-22 NOTE — Assessment & Plan Note (Addendum)
This problem is chronic and stable. Her weight decreased from 316 in September 2017 to 276 at her last appointment in February. She has since increased to 291 pounds today although I wonder how accurate today's weight is. Her weight loss has been due to surgeries and chronic disability. She states she has a good appetite and is eating. Her BMI is 47 today.  Plan : Continue to follow

## 2016-11-22 NOTE — Assessment & Plan Note (Signed)
This problem is chronic and stable. I entered details in the overview. She requests a female gastroenterologist so that she can complete her colonoscopy as she has not had any surveillance since her treatment in 2014. We are attempting to get records from [facility].  PLAN : Refer to GI for colon cancer surveillance Attempt to get records

## 2016-11-22 NOTE — Assessment & Plan Note (Signed)
This problem is chronic and uncontrolled. Her regimen includes lisinopril 5 mg, Coreg 18.75 twice a day, and diltiazem 120 daily. She has no side effects to these medications and takes them regularly. Her blood pressure is not controlled as her goal would be 120/80.  PLAN : Increase lisinopril to 10 mg Continue diltiazem and Coreg as is Return to clinic for blood pressure recheck

## 2016-11-22 NOTE — Progress Notes (Signed)
   Subjective:    Patient ID: Suzanne Macdonald, female    DOB: [birth year], 66 y.o.   MRN: [id number]  HPI  Suzanne Macdonald is here for hospital F/U and RLS. Please see the A&P for the status of the pt's chronic medical problems.  ROS : per ROS section and in problem oriented charting. All other systems are negative.  PMHx, Soc hx, and / or Fam hx : Had colon cancer about 2014 tx in [place], [place]. Had excision and tried chemo but had too many SE and stopped. No cancer care since then. Has home wound care and RN and PT.    Review of Systems  Constitutional: Positive for fatigue and unexpected weight change. Negative for appetite change and fever.  Cardiovascular: Positive for leg swelling.  Gastrointestinal: Positive for abdominal pain and diarrhea.  Neurological:       + nerve pain all over Generalized weakness  Psychiatric/Behavioral: Positive for sleep disturbance.       Objective:   Physical Exam  Constitutional: She appears well-developed and well-nourished. No distress.  HENT:  Head: Normocephalic and atraumatic.  Right Ear: External ear normal.  Left Ear: External ear normal.  Nose: Nose normal.  Eyes: Conjunctivae and EOM are normal. Right eye exhibits no discharge. Left eye exhibits no discharge. No scleral icterus.  Cardiovascular: Normal rate, regular rhythm and normal heart sounds.   No murmur heard. Musculoskeletal: Normal range of motion. She exhibits no tenderness.  Trace edema B  Skin: Skin is warm and dry. She is not diaphoretic.  Pale  Psychiatric: She has a normal mood and affect. Her behavior is normal. Judgment and thought content normal.          Assessment & Plan:

## 2016-11-23 ENCOUNTER — Telehealth: Payer: Self-pay | Admitting: *Deleted

## 2016-11-23 DIAGNOSIS — I480 Paroxysmal atrial fibrillation: Secondary | ICD-10-CM | POA: Diagnosis not present

## 2016-11-23 DIAGNOSIS — I10 Essential (primary) hypertension: Secondary | ICD-10-CM | POA: Diagnosis not present

## 2016-11-23 DIAGNOSIS — B961 Klebsiella pneumoniae [K. pneumoniae] as the cause of diseases classified elsewhere: Secondary | ICD-10-CM | POA: Diagnosis not present

## 2016-11-23 DIAGNOSIS — L03311 Cellulitis of abdominal wall: Secondary | ICD-10-CM | POA: Diagnosis not present

## 2016-11-23 DIAGNOSIS — L02211 Cutaneous abscess of abdominal wall: Secondary | ICD-10-CM | POA: Diagnosis not present

## 2016-11-23 DIAGNOSIS — T814XXD Infection following a procedure, subsequent encounter: Secondary | ICD-10-CM | POA: Diagnosis not present

## 2016-11-23 LAB — AEROBIC/ANAEROBIC CULTURE (SURGICAL/DEEP WOUND)

## 2016-11-23 LAB — AEROBIC/ANAEROBIC CULTURE W GRAM STAIN (SURGICAL/DEEP WOUND)

## 2016-11-23 NOTE — Telephone Encounter (Signed)
If she is having post op pain, she would need to contact her surgeon to ensure this is just simple post op pain and not a complication.

## 2016-11-23 NOTE — Telephone Encounter (Signed)
Arbie Cookey (advance home care) stated patient is having a lot of pain even with taking prescribed norco.  Patient receives wound care MWF & is particularly painful during procedure. Patient would like to try something else for pain. Thanks!

## 2016-11-26 DIAGNOSIS — I10 Essential (primary) hypertension: Secondary | ICD-10-CM | POA: Diagnosis not present

## 2016-11-26 DIAGNOSIS — T814XXD Infection following a procedure, subsequent encounter: Secondary | ICD-10-CM | POA: Diagnosis not present

## 2016-11-26 DIAGNOSIS — I480 Paroxysmal atrial fibrillation: Secondary | ICD-10-CM | POA: Diagnosis not present

## 2016-11-26 DIAGNOSIS — L03311 Cellulitis of abdominal wall: Secondary | ICD-10-CM | POA: Diagnosis not present

## 2016-11-26 DIAGNOSIS — L02211 Cutaneous abscess of abdominal wall: Secondary | ICD-10-CM | POA: Diagnosis not present

## 2016-11-26 DIAGNOSIS — B961 Klebsiella pneumoniae [K. pneumoniae] as the cause of diseases classified elsewhere: Secondary | ICD-10-CM | POA: Diagnosis not present

## 2016-11-26 NOTE — Telephone Encounter (Signed)
Patient has contacted her surgeon & had her pain meds adjusted. She stated she is now feeling much better.

## 2016-11-28 DIAGNOSIS — I10 Essential (primary) hypertension: Secondary | ICD-10-CM | POA: Diagnosis not present

## 2016-11-28 DIAGNOSIS — L02211 Cutaneous abscess of abdominal wall: Secondary | ICD-10-CM | POA: Diagnosis not present

## 2016-11-28 DIAGNOSIS — L03311 Cellulitis of abdominal wall: Secondary | ICD-10-CM | POA: Diagnosis not present

## 2016-11-28 DIAGNOSIS — B961 Klebsiella pneumoniae [K. pneumoniae] as the cause of diseases classified elsewhere: Secondary | ICD-10-CM | POA: Diagnosis not present

## 2016-11-28 DIAGNOSIS — T814XXD Infection following a procedure, subsequent encounter: Secondary | ICD-10-CM | POA: Diagnosis not present

## 2016-11-28 DIAGNOSIS — I480 Paroxysmal atrial fibrillation: Secondary | ICD-10-CM | POA: Diagnosis not present

## 2016-11-30 DIAGNOSIS — I480 Paroxysmal atrial fibrillation: Secondary | ICD-10-CM | POA: Diagnosis not present

## 2016-11-30 DIAGNOSIS — I10 Essential (primary) hypertension: Secondary | ICD-10-CM | POA: Diagnosis not present

## 2016-11-30 DIAGNOSIS — L03311 Cellulitis of abdominal wall: Secondary | ICD-10-CM | POA: Diagnosis not present

## 2016-11-30 DIAGNOSIS — T814XXD Infection following a procedure, subsequent encounter: Secondary | ICD-10-CM | POA: Diagnosis not present

## 2016-11-30 DIAGNOSIS — B961 Klebsiella pneumoniae [K. pneumoniae] as the cause of diseases classified elsewhere: Secondary | ICD-10-CM | POA: Diagnosis not present

## 2016-11-30 DIAGNOSIS — L02211 Cutaneous abscess of abdominal wall: Secondary | ICD-10-CM | POA: Diagnosis not present

## 2016-12-03 ENCOUNTER — Encounter: Payer: Self-pay | Admitting: Internal Medicine

## 2016-12-06 DIAGNOSIS — T814XXD Infection following a procedure, subsequent encounter: Secondary | ICD-10-CM | POA: Diagnosis not present

## 2016-12-06 DIAGNOSIS — I480 Paroxysmal atrial fibrillation: Secondary | ICD-10-CM | POA: Diagnosis not present

## 2016-12-06 DIAGNOSIS — I10 Essential (primary) hypertension: Secondary | ICD-10-CM | POA: Diagnosis not present

## 2016-12-06 DIAGNOSIS — L03311 Cellulitis of abdominal wall: Secondary | ICD-10-CM | POA: Diagnosis not present

## 2016-12-06 DIAGNOSIS — L02211 Cutaneous abscess of abdominal wall: Secondary | ICD-10-CM | POA: Diagnosis not present

## 2016-12-06 DIAGNOSIS — B961 Klebsiella pneumoniae [K. pneumoniae] as the cause of diseases classified elsewhere: Secondary | ICD-10-CM | POA: Diagnosis not present

## 2016-12-07 ENCOUNTER — Other Ambulatory Visit: Payer: Self-pay | Admitting: Cardiovascular Disease

## 2016-12-07 MED ORDER — APIXABAN 5 MG PO TABS: 5.0000 mg | ORAL_TABLET | Freq: Two times a day (BID) | ORAL | 1 refills | Status: AC

## 2016-12-07 NOTE — Telephone Encounter (Signed)
New message      *STAT* If patient is at the pharmacy, call can be transferred to refill team.   1. Which medications need to be refilled? (please list name of each medication and dose if known) Eliquis  2. Which pharmacy/location (including street and city if local pharmacy) is medication to be sent to? [place] [facility]  3. Do they need a 30 day or 90 day supply? [place]

## 2016-12-11 ENCOUNTER — Encounter: Payer: Self-pay | Admitting: Cardiovascular Disease

## 2016-12-11 ENCOUNTER — Ambulatory Visit (INDEPENDENT_AMBULATORY_CARE_PROVIDER_SITE_OTHER): Payer: Medicare Other | Admitting: Cardiovascular Disease

## 2016-12-11 VITALS — BP 134/60 | HR 68 | Ht 60.0 in | Wt 278.8 lb

## 2016-12-11 DIAGNOSIS — E7800 Pure hypercholesterolemia, unspecified: Secondary | ICD-10-CM | POA: Diagnosis not present

## 2016-12-11 DIAGNOSIS — Z7901 Long term (current) use of anticoagulants: Secondary | ICD-10-CM | POA: Diagnosis not present

## 2016-12-11 DIAGNOSIS — E785 Hyperlipidemia, unspecified: Secondary | ICD-10-CM

## 2016-12-11 DIAGNOSIS — I10 Essential (primary) hypertension: Secondary | ICD-10-CM | POA: Diagnosis not present

## 2016-12-11 DIAGNOSIS — I480 Paroxysmal atrial fibrillation: Secondary | ICD-10-CM | POA: Diagnosis not present

## 2016-12-11 MED ORDER — ATORVASTATIN CALCIUM 20 MG PO TABS: 20.0000 mg | ORAL_TABLET | Freq: Every day | ORAL | 1 refills | Status: DC

## 2016-12-11 NOTE — Patient Instructions (Signed)
Medication Instructions:  INCREASE YOUR ATORVASTATIN TO 20 MG DAILY  INCREASE YOUR LISINOPRIL TO 10  MG DAILY   Labwork: FASTING LP/CMET AT [facility] 6-8 WEEKS   Testing/Procedures: NONE  Follow-Up: Your physician wants you to follow-up in: [place] will receive a reminder letter in the mail two months in advance. If you don't receive a letter, please call our office to schedule the follow-up appointment.  If you need a refill on your cardiac medications before your next appointment, please call your pharmacy.
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
## 2016-12-13 ENCOUNTER — Ambulatory Visit (INDEPENDENT_AMBULATORY_CARE_PROVIDER_SITE_OTHER): Payer: Medicare Other | Admitting: Internal Medicine

## 2016-12-13 ENCOUNTER — Encounter: Payer: Self-pay | Admitting: Internal Medicine

## 2016-12-13 VITALS — BP 124/65 | HR 55 | Temp 98.9°F | Wt 280.6 lb

## 2016-12-13 DIAGNOSIS — L03311 Cellulitis of abdominal wall: Secondary | ICD-10-CM | POA: Diagnosis not present

## 2016-12-13 DIAGNOSIS — I10 Essential (primary) hypertension: Secondary | ICD-10-CM | POA: Diagnosis not present

## 2016-12-13 DIAGNOSIS — L02211 Cutaneous abscess of abdominal wall: Secondary | ICD-10-CM | POA: Diagnosis not present

## 2016-12-13 DIAGNOSIS — I480 Paroxysmal atrial fibrillation: Secondary | ICD-10-CM | POA: Diagnosis not present

## 2016-12-13 DIAGNOSIS — T814XXD Infection following a procedure, subsequent encounter: Secondary | ICD-10-CM | POA: Diagnosis not present

## 2016-12-13 DIAGNOSIS — B961 Klebsiella pneumoniae [K. pneumoniae] as the cause of diseases classified elsewhere: Secondary | ICD-10-CM | POA: Diagnosis not present

## 2016-12-13 DIAGNOSIS — L600 Ingrowing nail: Secondary | ICD-10-CM

## 2016-12-13 NOTE — Progress Notes (Signed)
   Procedure Name: Ingrown Toenail Removal Indication: Pain, infection Location: Right great toe  Pre-Procedure Diagnosis: Ingrown Toenail Post-Procedure Diagnosis: Same, removed  Informed consent: Procedure, alternate treatment options, risks, and benefits were thoroughly explained to the patient and informed consent was obtained before the procedure started. Equipment for the procedure was set up.  PROCEDURE An appropriate timeout was taken. The procedure and site were confirmed by patient and team. -The area was prepped and cleaned in the usual sterile fashion. A digital block was done using 10 mL of Lidocaine 1% without epinephrine for local anesthesia. -The nail splitter was inserted under the nail plate of the ingrown part of the toenail to separate the nail plate from the nail bed. The nail splitter was sent all the way back to the nail matrix. -Using the nail splitter, the toenail was cut to separate the ingrown portion from the healthy portion. -A straight hemostat was then advanced under the free portion of the nail, as far as possible to grab the entire nail. It was then clamped down on this free nail segment and twisted and turned until the entire separated piece of toenail was removed. The entire piece was confirmed to be fully removed intact. -Hemostasis was achieved through direct pressure and silver nitrate was applied to the area.   A dressing was applied to the area. Anticipatory guidance, as well as standard post-procedure care, was explained. Return precautions are given. The patient tolerated the procedure well without complications. Follow-up visit scheduled.

## 2016-12-13 NOTE — Patient Instructions (Signed)
Suzanne Macdonald,   Terence Lux or Toenail Removal, Adult, Care After This sheet gives you information about how to care for yourself after your procedure. Your health care provider may also give you more specific instructions. If you have problems or questions, contact your health care provider. What can I expect after the procedure? After the procedure, it is common to have:  Pain.  Redness.  Swelling.  Soreness. Follow these instructions at home:  If you have a splint:  Do not put pressure on any part of the splint until it is fully hardened. This may take several hours.  Wear the splint as told by your health care provider. Remove it only as told by your health care provider.  Loosen the splint if your fingers or toes tingle, become numb, or turn cold and blue.  Keep the splint clean.  If the splint is not waterproof:  Do not let it get wet.  Cover it with a watertight covering when you take a bath or a shower. Wound care    Follow instructions from your health care provider about how to take care of your wound. Make sure you:  Wash your hands with soap and water before you change your bandage (dressing). If soap and water are not available, use hand sanitizer.  Change your dressing as told by your health care provider.  Keep your dressing dry until your health care provider says it can be removed.  Leave stitches (sutures), skin glue, or adhesive strips in place. These skin closures may need to stay in place for 2 weeks or longer. If adhesive strip edges start to loosen and curl up, you may trim the loose edges. Do not remove adhesive strips completely unless your health care provider tells you to do that.  Check your wound every day for signs of infection. Check for:  More redness, swelling, or pain.  More fluid or blood.  Warmth.  Pus or a bad smell. Managing pain, stiffness, and swelling   Move your fingers or toes often to avoid stiffness and to lessen  swelling.  Raise (elevate) the injured area above the level of your heart while you are sitting or lying down. You may need to keep your finger or toe raised or supported on a pillow for 24 hours or as told by your health care provider.  Soak your hand or foot in warm, soapy water for 10-20 minutes, 3 times a day or as told by your health care provider. Medicine   Take over-the-counter and prescription medicines only as told by your health care provider.  If you were prescribed an antibiotic medicine, use it as told by your health care provider. Do not stop using the antibiotic even if your condition improves. General instructions   If you were given a shoe to wear, wear it as told by your health care provider.  Keep all follow-up visits as told by your health care provider. This is important. Contact a health care provider if:  You have more redness, swelling, or pain around your wound.  You have more fluid or blood coming from your wound.  Your wound feels warm to the touch.  You have pus or a bad smell coming from your wound.  You have a fever.  Your finger or toe looks blue or black. This information is not intended to replace advice given to you by your health care provider. Make sure you discuss any questions you have with your health care provider. Document Released: 08/27/2014  Document Revised: 04/04/2016 Document Reviewed: 02/13/2016 Elsevier Interactive Patient Education  2017 [employer].

## 2016-12-14 NOTE — Progress Notes (Signed)
Internal Medicine Clinic Attending  I saw and evaluated the patient. I was present for the entirety of the procedure and agree with the procedure documentation in the resident's note.

## 2016-12-17 DIAGNOSIS — L02211 Cutaneous abscess of abdominal wall: Secondary | ICD-10-CM | POA: Diagnosis not present

## 2016-12-17 DIAGNOSIS — B961 Klebsiella pneumoniae [K. pneumoniae] as the cause of diseases classified elsewhere: Secondary | ICD-10-CM | POA: Diagnosis not present

## 2016-12-17 DIAGNOSIS — T814XXD Infection following a procedure, subsequent encounter: Secondary | ICD-10-CM | POA: Diagnosis not present

## 2016-12-17 DIAGNOSIS — L03311 Cellulitis of abdominal wall: Secondary | ICD-10-CM | POA: Diagnosis not present

## 2016-12-17 DIAGNOSIS — I480 Paroxysmal atrial fibrillation: Secondary | ICD-10-CM | POA: Diagnosis not present

## 2016-12-17 DIAGNOSIS — I10 Essential (primary) hypertension: Secondary | ICD-10-CM | POA: Diagnosis not present

## 2016-12-18 ENCOUNTER — Other Ambulatory Visit: Payer: Self-pay | Admitting: Cardiovascular Disease

## 2016-12-18 NOTE — Telephone Encounter (Signed)
Please review for refill. Thanks!  

## 2016-12-18 NOTE — Telephone Encounter (Signed)
REFILL 

## 2016-12-19 DIAGNOSIS — B961 Klebsiella pneumoniae [K. pneumoniae] as the cause of diseases classified elsewhere: Secondary | ICD-10-CM | POA: Diagnosis not present

## 2016-12-19 DIAGNOSIS — I480 Paroxysmal atrial fibrillation: Secondary | ICD-10-CM | POA: Diagnosis not present

## 2016-12-19 DIAGNOSIS — T814XXD Infection following a procedure, subsequent encounter: Secondary | ICD-10-CM | POA: Diagnosis not present

## 2016-12-19 DIAGNOSIS — L03311 Cellulitis of abdominal wall: Secondary | ICD-10-CM | POA: Diagnosis not present

## 2016-12-19 DIAGNOSIS — I10 Essential (primary) hypertension: Secondary | ICD-10-CM | POA: Diagnosis not present

## 2016-12-19 DIAGNOSIS — L02211 Cutaneous abscess of abdominal wall: Secondary | ICD-10-CM | POA: Diagnosis not present

## 2016-12-24 DIAGNOSIS — L03311 Cellulitis of abdominal wall: Secondary | ICD-10-CM | POA: Diagnosis not present

## 2016-12-24 DIAGNOSIS — I480 Paroxysmal atrial fibrillation: Secondary | ICD-10-CM | POA: Diagnosis not present

## 2016-12-24 DIAGNOSIS — T814XXD Infection following a procedure, subsequent encounter: Secondary | ICD-10-CM | POA: Diagnosis not present

## 2016-12-24 DIAGNOSIS — B961 Klebsiella pneumoniae [K. pneumoniae] as the cause of diseases classified elsewhere: Secondary | ICD-10-CM | POA: Diagnosis not present

## 2016-12-24 DIAGNOSIS — I10 Essential (primary) hypertension: Secondary | ICD-10-CM | POA: Diagnosis not present

## 2016-12-24 DIAGNOSIS — L02211 Cutaneous abscess of abdominal wall: Secondary | ICD-10-CM | POA: Diagnosis not present

## 2016-12-28 ENCOUNTER — Other Ambulatory Visit: Payer: Self-pay | Admitting: Cardiovascular Disease

## 2016-12-28 IMAGING — CT CT ABD-PELV W/ CM
2 of 5 series · 15 of 46 positions shown, 17 images · IV contrast (Omni 300)
Comparison: Abdominal CT dated 05/09/2016

CLINICAL DATA: 65-year-old female with abdominal pain and fever.
History of hernia surgery 2 weeks ago.

EXAM:
CT ABDOMEN AND PELVIS WITH CONTRAST
TECHNIQUE: Multidetector CT imaging of the abdomen and pelvis was performed
using the standard protocol following bolus administration of
intravenous contrast.
CONTRAST:  100mL 6DUEYT-AHH IOPAMIDOL (6DUEYT-AHH) INJECTION 61%

[Series 2: a/p w/ 5mm · axial · 0.93mm/px · z∈[-502,-92]mm · 12 of 94 slices shown, 14 images]
[im 6/94  soft-tissue]
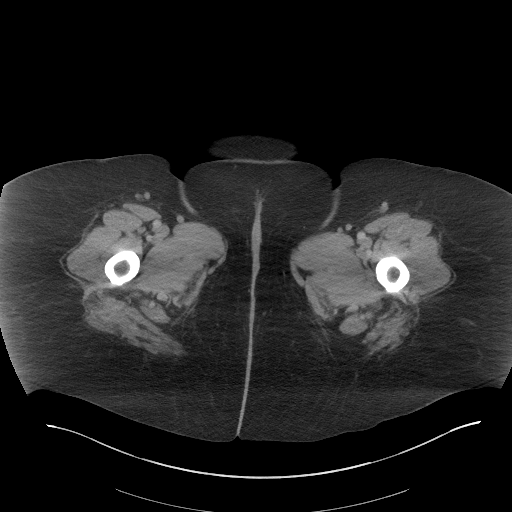
[im 6/94  bone]
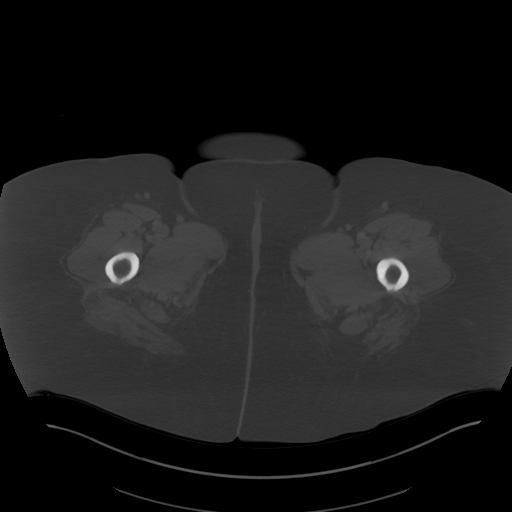
[im 17/94  soft-tissue]
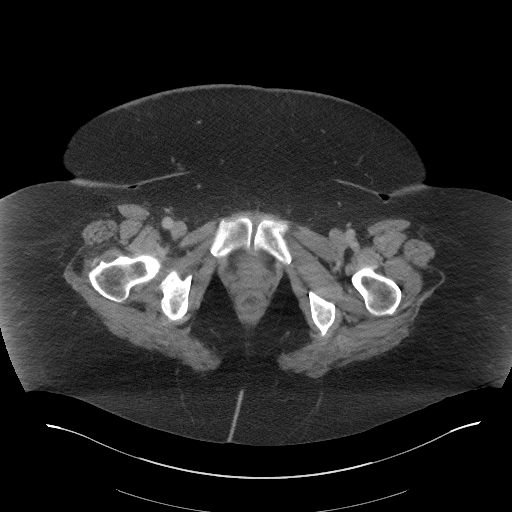
[im 22/94  soft-tissue]
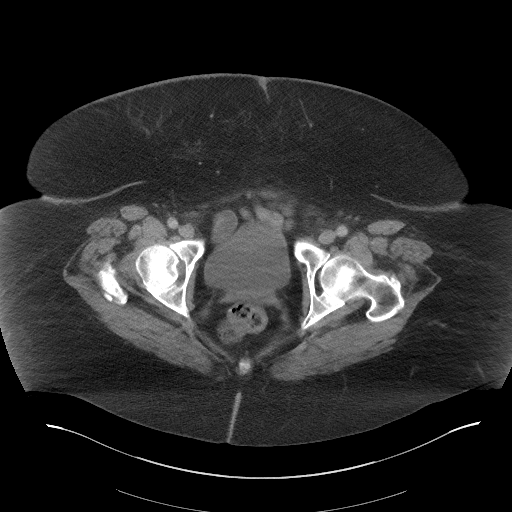
[im 28/94  soft-tissue]
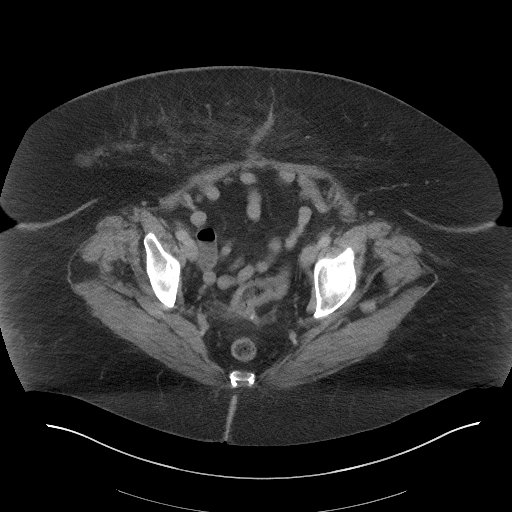
[im 39/94  soft-tissue]
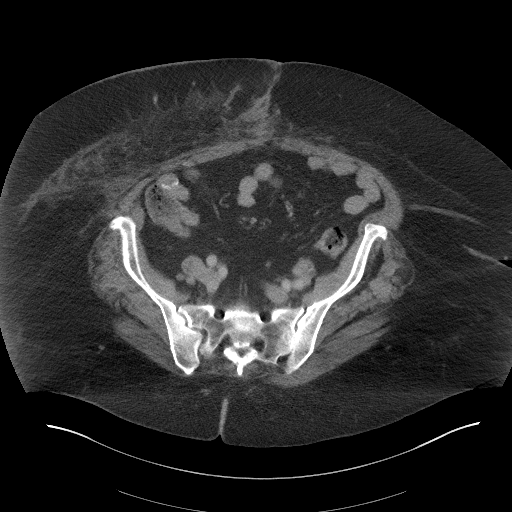
[im 44/94  soft-tissue]
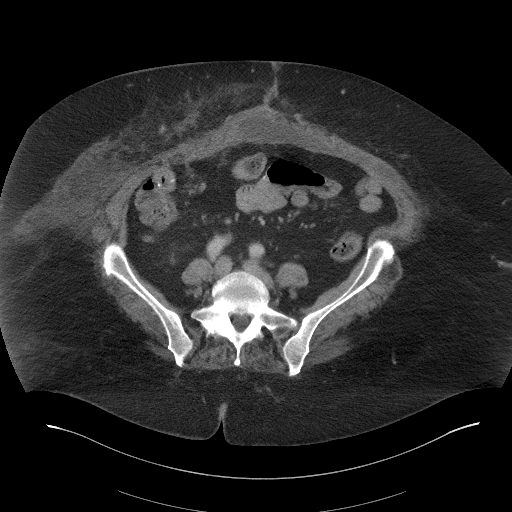
[im 50/94  soft-tissue]
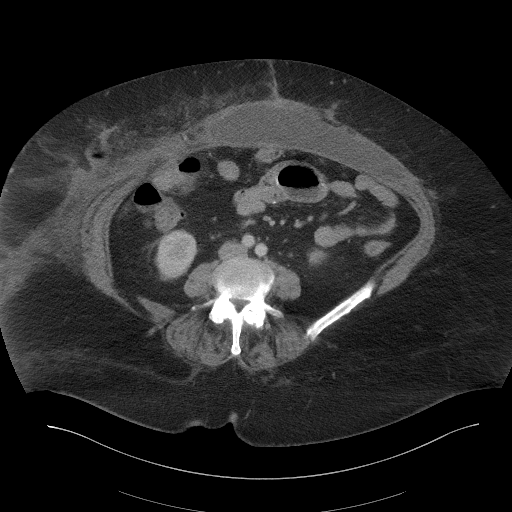
[im 61/94  soft-tissue]
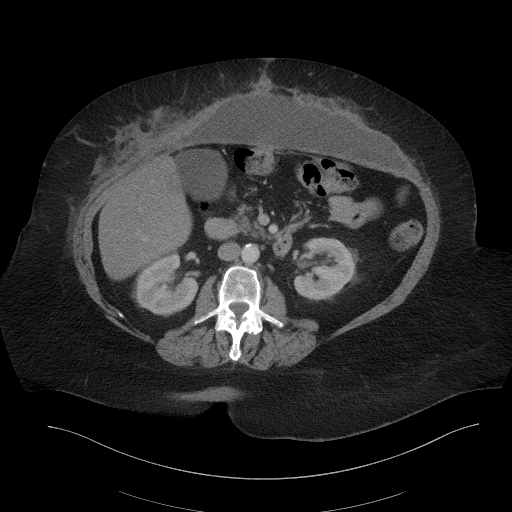
[im 66/94  soft-tissue]
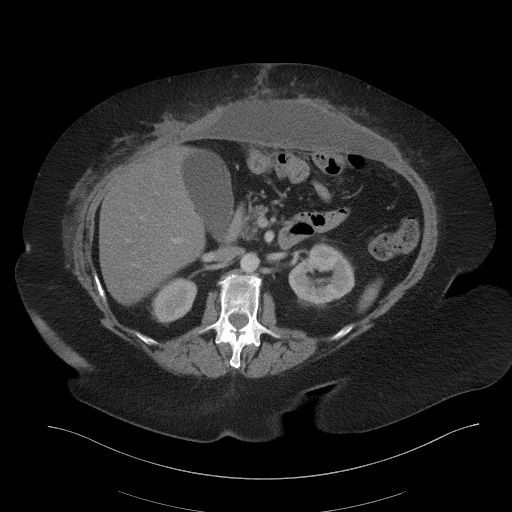
[im 66/94  bone]
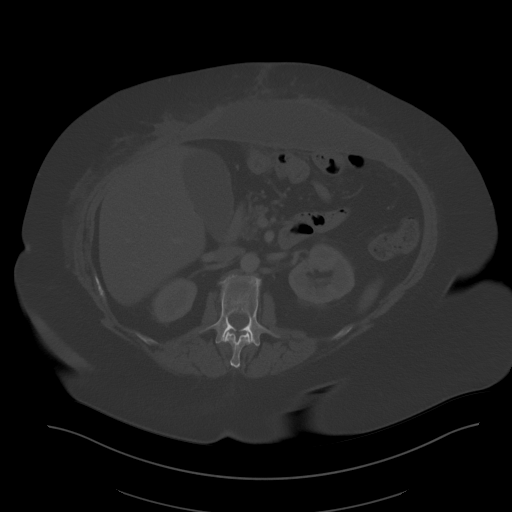
[im 72/94  soft-tissue]
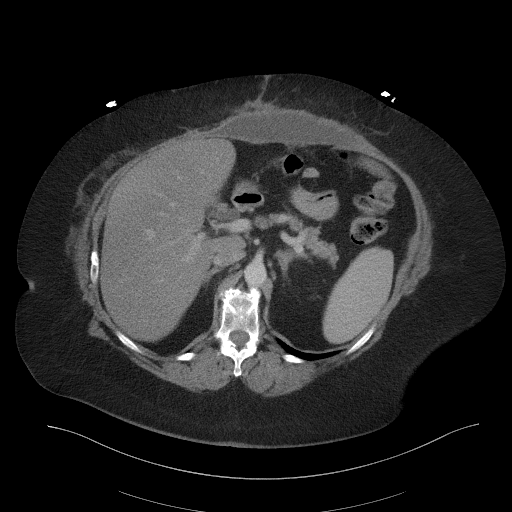
[im 83/94  soft-tissue]
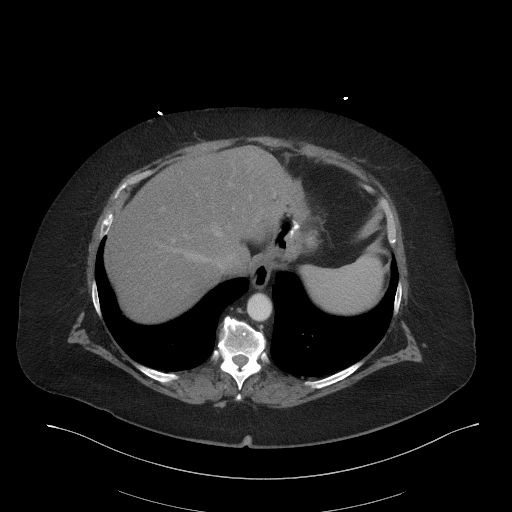
[im 88/94  soft-tissue]
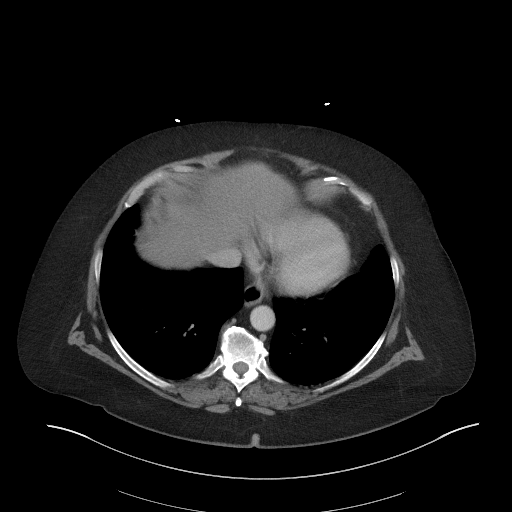

[Series 5: a/p w/ cor · coronal · 0.95mm/px · 3 of 182 slices shown]
[im 61/182  soft-tissue]
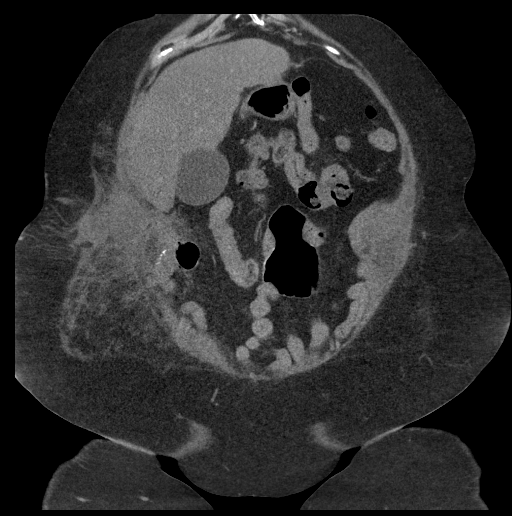
[im 81/182  soft-tissue]
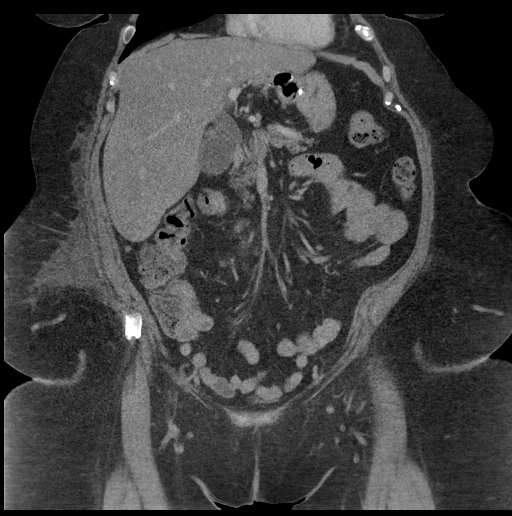
[im 101/182  soft-tissue]
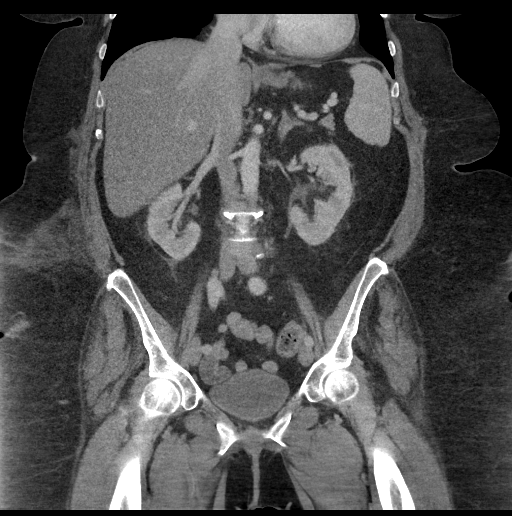

[15 of 46 positions shown; findings below may reference images not displayed]

FINDINGS: Lower chest: The visualized lung bases are clear.

No intra-abdominal free air or free fluid.

Hepatobiliary: Diffuse fatty infiltration of the liver. No
intrahepatic biliary ductal dilatation. The gallbladder is
unremarkable.

Pancreas: Unremarkable. No pancreatic ductal dilatation or
surrounding inflammatory changes.

Spleen: Normal in size without focal abnormality.

Adrenals/Urinary Tract: There is a 1.5 cm left adrenal hypodense
nodule measuring less than 10 Hounsfield units most compatible with
an adenoma. The right adrenal gland appears unremarkable. The
kidneys, visualized ureters, and urinary bladder appear
unremarkable.

Stomach/Bowel: There postsurgical changes of gastric bypass with
multiple anastomotic sutures in the small bowel. There is also
partial resection of the sigmoid colon. No evidence of bowel
obstruction or active inflammation. Normal appendix.

Vascular/Lymphatic: There is mild moderate aortoiliac
atherosclerotic disease. The abdominal aorta and IVC are otherwise
unremarkable. No portal venous gas identified. There is no
adenopathy.

Reproductive: Hysterectomy.

Other: There is a midline vertical anterior pelvic wall incisional
scar. Postsurgical changes of ventral hernia repair noted. There is
a large fluid collection in the anterior abdominal wall along
measuring approximately 21 x 5 cm in greatest axial dimension and 22
cm in the craniocaudal length similar to the prior CT. There may be
minimal enhancement of the adjacent capsule concerning for an
infected collection.

A 5.0 x 7.2 cm collection noted containing air-fluid level in the
right anterior abdominal wall lateral to the larger collection. This
collection appears to be more superficial and likely in the
subcutaneous tissues and superficial to the external oblique
musculature. This collection is new since the prior CT. There is an
apparent tract like communication extending from this collection to
the deeper soft tissues and through the abdominal fascia and
musculature and abutting the small bowel anastomosis in the right
anterior abdominal wall. There is also apparent communication with
the larger collection in the anterior abdominal wall. Although the
smaller collection in the lateral right anterior abdominal wall may
be extension of the larger collection in the midline, dehiscence of
in the adjacent small bowel anastomosis is not entirely excluded.
Further evaluation with CT with oral contrast is recommended.

There is diffuse stranding of the subcutaneous soft tissues of the
anterior abdominal wall.

Musculoskeletal: Multilevel degenerative changes of the spine. No
acute fracture.
IMPRESSION: Persistent large loculated fluid in the anterior abdominal wall at
the site of hernia repair concerning for infected fluid.

There has been interval development of a smaller collection with air
and fluid level in the right anterior abdominal wall laterally which
appears to communicate with the larger collection in the midline
anterior abdomen. A tract like communication also extends from the
smaller collection in the right lateral anterior abdominal wall and
abuts the adjacent small bowel anastomosis. Dehiscence of the
anastomotic suture as the cause of fluid collection is not entirely
excluded. Further evaluation with CT with oral contrast is
recommended.

## 2016-12-28 NOTE — Telephone Encounter (Signed)
Please review for refill. Thanks!  

## 2016-12-28 NOTE — Telephone Encounter (Signed)
REFILL 

## 2016-12-31 DIAGNOSIS — T814XXD Infection following a procedure, subsequent encounter: Secondary | ICD-10-CM | POA: Diagnosis not present

## 2016-12-31 DIAGNOSIS — I10 Essential (primary) hypertension: Secondary | ICD-10-CM | POA: Diagnosis not present

## 2016-12-31 DIAGNOSIS — I480 Paroxysmal atrial fibrillation: Secondary | ICD-10-CM | POA: Diagnosis not present

## 2016-12-31 DIAGNOSIS — L02211 Cutaneous abscess of abdominal wall: Secondary | ICD-10-CM | POA: Diagnosis not present

## 2016-12-31 DIAGNOSIS — B961 Klebsiella pneumoniae [K. pneumoniae] as the cause of diseases classified elsewhere: Secondary | ICD-10-CM | POA: Diagnosis not present

## 2016-12-31 DIAGNOSIS — L03311 Cellulitis of abdominal wall: Secondary | ICD-10-CM | POA: Diagnosis not present

## 2017-01-02 ENCOUNTER — Telehealth: Payer: Self-pay | Admitting: Internal Medicine

## 2017-01-02 NOTE — Telephone Encounter (Signed)
LMOM for APPT 01/03/2017 with Dr. Lynnae January

## 2017-01-03 ENCOUNTER — Ambulatory Visit (INDEPENDENT_AMBULATORY_CARE_PROVIDER_SITE_OTHER): Payer: Medicare Other | Admitting: Internal Medicine

## 2017-01-03 ENCOUNTER — Ambulatory Visit (HOSPITAL_COMMUNITY)
Admission: RE | Admit: 2017-01-03 | Discharge: 2017-01-03 | Disposition: A | Discharge status: Home / Self Care | Payer: Medicare Other | Source: Ambulatory Visit | Attending: Internal Medicine | Admitting: Internal Medicine

## 2017-01-03 VITALS — BP 150/67 | HR 53 | Wt 287.2 lb

## 2017-01-03 DIAGNOSIS — I10 Essential (primary) hypertension: Secondary | ICD-10-CM | POA: Diagnosis not present

## 2017-01-03 DIAGNOSIS — Z9049 Acquired absence of other specified parts of digestive tract: Secondary | ICD-10-CM

## 2017-01-03 DIAGNOSIS — I480 Paroxysmal atrial fibrillation: Secondary | ICD-10-CM

## 2017-01-03 DIAGNOSIS — M170 Bilateral primary osteoarthritis of knee: Secondary | ICD-10-CM | POA: Insufficient documentation

## 2017-01-03 DIAGNOSIS — E440 Moderate protein-calorie malnutrition: Secondary | ICD-10-CM

## 2017-01-03 DIAGNOSIS — Z0000 Encounter for general adult medical examination without abnormal findings: Secondary | ICD-10-CM

## 2017-01-03 DIAGNOSIS — G894 Chronic pain syndrome: Secondary | ICD-10-CM

## 2017-01-03 DIAGNOSIS — M1990 Unspecified osteoarthritis, unspecified site: Secondary | ICD-10-CM | POA: Diagnosis present

## 2017-01-03 DIAGNOSIS — Z9884 Bariatric surgery status: Secondary | ICD-10-CM | POA: Diagnosis not present

## 2017-01-03 DIAGNOSIS — Z85038 Personal history of other malignant neoplasm of large intestine: Secondary | ICD-10-CM | POA: Diagnosis not present

## 2017-01-03 DIAGNOSIS — F5104 Psychophysiologic insomnia: Secondary | ICD-10-CM

## 2017-01-03 DIAGNOSIS — Z79899 Other long term (current) drug therapy: Secondary | ICD-10-CM | POA: Diagnosis not present

## 2017-01-03 DIAGNOSIS — M50322 Other cervical disc degeneration at C5-C6 level: Secondary | ICD-10-CM | POA: Insufficient documentation

## 2017-01-03 DIAGNOSIS — E46 Unspecified protein-calorie malnutrition: Secondary | ICD-10-CM

## 2017-01-03 DIAGNOSIS — G2581 Restless legs syndrome: Secondary | ICD-10-CM | POA: Diagnosis not present

## 2017-01-03 DIAGNOSIS — Z7901 Long term (current) use of anticoagulants: Secondary | ICD-10-CM | POA: Diagnosis not present

## 2017-01-03 DIAGNOSIS — R7309 Other abnormal glucose: Secondary | ICD-10-CM

## 2017-01-03 DIAGNOSIS — F5101 Primary insomnia: Secondary | ICD-10-CM

## 2017-01-03 DIAGNOSIS — Z87891 Personal history of nicotine dependence: Secondary | ICD-10-CM

## 2017-01-03 DIAGNOSIS — I700 Atherosclerosis of aorta: Secondary | ICD-10-CM

## 2017-01-03 DIAGNOSIS — M1711 Unilateral primary osteoarthritis, right knee: Secondary | ICD-10-CM | POA: Diagnosis not present

## 2017-01-03 DIAGNOSIS — D509 Iron deficiency anemia, unspecified: Secondary | ICD-10-CM | POA: Diagnosis not present

## 2017-01-03 DIAGNOSIS — M1712 Unilateral primary osteoarthritis, left knee: Secondary | ICD-10-CM | POA: Diagnosis not present

## 2017-01-03 DIAGNOSIS — M542 Cervicalgia: Secondary | ICD-10-CM | POA: Diagnosis not present

## 2017-01-03 LAB — GLUCOSE, CAPILLARY: GLUCOSE-CAPILLARY: 108 mg/dL — AB (ref 65–99)

## 2017-01-03 LAB — POCT GLYCOSYLATED HEMOGLOBIN (HGB A1C): Hemoglobin A1C: 6.1

## 2017-01-03 MED ORDER — DICLOFENAC SODIUM 1 % TD GEL: 2.0000 g | Freq: Four times a day (QID) | TRANSDERMAL | 5 refills | Status: AC

## 2017-01-03 MED ORDER — LOPERAMIDE HCL 2 MG PO CAPS: 2.0000 mg | ORAL_CAPSULE | Freq: Every day | ORAL | 1 refills | Status: AC | PRN

## 2017-01-03 MED ORDER — ALBUTEROL SULFATE HFA 108 (90 BASE) MCG/ACT IN AERS: 1.0000 | INHALATION_SPRAY | Freq: Four times a day (QID) | RESPIRATORY_TRACT | 5 refills | Status: AC | PRN

## 2017-01-03 MED ORDER — MOMETASONE FUROATE 50 MCG/ACT NA SUSP: 2.0000 | Freq: Every day | NASAL | 2 refills | Status: AC

## 2017-01-03 MED ORDER — BUDESONIDE-FORMOTEROL FUMARATE 160-4.5 MCG/ACT IN AERO: 2.0000 | INHALATION_SPRAY | Freq: Two times a day (BID) | RESPIRATORY_TRACT | 11 refills | Status: AC

## 2017-01-03 MED ORDER — DULOXETINE HCL 30 MG PO CPEP: ORAL_CAPSULE | ORAL | 3 refills | Status: AC

## 2017-01-03 NOTE — Assessment & Plan Note (Signed)
We are attempting to get records from [place] as GI will not schedule an appointment until those have been obtained.  PLAN : Attempt to get old records

## 2017-01-03 NOTE — Assessment & Plan Note (Signed)
This problem is chronic and stable. She just recently saw her cardiologist who increased her statin. She is on Eliquis and is in sinus rhythm today.  PLAN:  Cont current meds

## 2017-01-03 NOTE — Assessment & Plan Note (Signed)
This problem is chronic and stable. Her hemoglobin is around 8 and her most recent ferritin was 36. I have not repeated it as she had not been stable and had recently been treated for an intra-abdominal abscess. Once she is stable, I will need to repeat her ferritin has she is on once a day iron supplementation.  PLAN :Continue iron supplementation Check ferritin 1 stable

## 2017-01-03 NOTE — Assessment & Plan Note (Signed)
She asked about vitamin D supplementation as she has been taking it since her teens but has stopped recently when she developed heart issues. 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 My quick read states that it has been looked at in cardiac health but was not shown to be beneficial. I'm going to do more reading about the association between vitamin D and cardiac health and get back to her at her next appointment.

## 2017-01-03 NOTE — Patient Instructions (Signed)
1. Please see me in 2-3 months for pain follow-up 2. Your blood work for Dr. Oval Linsey is not due for another month 3. Try the Voltaren gel on your knees or other joints that are painful 4. Start the Cymbalta for pain. Take 1 pill once a day for 7 days then take 2 pills. 5. You have prediabetes. I will be happy to refer you to a nutritionist if you would like.

## 2017-01-03 NOTE — Assessment & Plan Note (Signed)
This problem is chronic and improved. She has not pregabalin 150 before bed. This does decrease the symptoms of restless leg that has not improved her sleep. She has no side effects to this medication.  PLAN:  Cont current meds

## 2017-01-03 NOTE — Assessment & Plan Note (Signed)
This problem is chronic and worsening. Her medication regimen is lisinopril 10 mg, Coreg 18.75, diltiazem 120. Her blood pressure had come down nicely and I increased her lisinopril to 10 mg her blood pressure is unexpectedly elevated today. I do not want to increase her medications today and will leave them as is. We will recheck her blood pressure at her next appointment.  PLAN:  Cont current meds   BP Readings from Last 3 Encounters:  01/03/17 (!) 150/67  12/13/16 124/65  12/11/16 134/60

## 2017-01-03 NOTE — Progress Notes (Signed)
   Subjective:    Patient ID: Suzanne Macdonald, female    DOB: 19-Jul-1951, 66 y.o.   MRN: [id number]  HPI  Suzanne Macdonald is here for HTN F/U. Please see the A&P for the status of the pt's chronic medical problems.  ROS : per ROS section and in problem oriented charting. All other systems are negative.  PMHx, Soc hx, and / or Fam hx : her husband will have HNP surgery in 8 days. Was dx with OA all over in [place] after a whole body scan.   Review of Systems  Constitutional: Negative for appetite change and unexpected weight change.  HENT: Positive for congestion, postnasal drip, sinus pressure and sneezing.   Eyes: Positive for itching.  Gastrointestinal: Positive for diarrhea. Negative for abdominal pain.  Musculoskeletal: Positive for arthralgias, back pain, gait problem and myalgias.  Psychiatric/Behavioral: Positive for sleep disturbance.       Objective:   Physical Exam  Constitutional: She appears well-developed and well-nourished. No distress.  HENT:  Head: Normocephalic and atraumatic.  Right Ear: External ear normal.  Left Ear: External ear normal.  Nose: Nose normal.  Eyes: Conjunctivae and EOM are normal.  Cardiovascular: Normal rate, regular rhythm and normal heart sounds.   No murmur heard. Musculoskeletal: Normal range of motion. She exhibits no tenderness or deformity.  Neurological: She is alert.  She has no muscle wasting of her left hand. Strength 5 out of 5 right upper extremity all muscle groups. Strength 3 out of 5 left upper extremity all muscle groups but I question effort. Sensation intact comparing left to right upper extremity. Range of motion of all joints of left upper extremity normal.  Skin: Skin is warm and dry. She is not diaphoretic. No erythema.  Psychiatric: She has a normal mood and affect. Her behavior is normal. Judgment and thought content normal.          Assessment & Plan:

## 2017-01-03 NOTE — Assessment & Plan Note (Signed)
This problem is chronic and stable. Her weight seems to be trending upwards that she had last from 316 pounds in September 2017 to 276 in February and is now 287. Her pre-albumin is 9.4 and her albumin is 2.9. I offered her referral to nutritionist today that she feels that she knows what to eat.  PLAN : Continue to follow  Wt Readings from Last 3 Encounters:  01/03/17 287 lb 3.2 oz (130.3 kg)  12/13/16 280 lb 9.6 oz (127.3 kg)  12/11/16 278 lb 12.8 oz (126.5 kg)

## 2017-01-03 NOTE — Assessment & Plan Note (Signed)
This problem is chronic and uncontrolled. She states that she continues to use trazodone 100 mg 2 pills at night. I thought that by treating her restless leg syndrome, which is now better on treatment, would help her insomnia but apparently it is not.  PLAN:  Cont current meds

## 2017-01-03 NOTE — Assessment & Plan Note (Signed)
This problem is chronic and stable. This was found incidentally on imaging. We are treating risk factors with a statin. She is asymptomatic.  PLAN:  Cont current meds

## 2017-01-03 NOTE — Assessment & Plan Note (Signed)
This is a new problem. She has had this for a while but today is the first week that she brought it up in detail. 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 This includes knees bilaterally, back, collar bone, left shoulder, left arm, ankles, toes, and hips bilaterally. She states that she was diagnosed in [place] when she got a whole body scan. She had no further treatment after that. Currently she is taking Tylenol but does not feel that it is helping her symptoms. She can hear her knees make a noise when she walks. She feels that her left hand goes numb but this only occurs at night. She sleeps with her left arm because otherwise it causes pain. She requests Vicodin because she got in the hospital and she felt it took care of the pain quite well.  I need to ask further at her next appointment whether these symptoms are worse in the morning and how long they last. There were no physical exam findings consistent with rheumatoid arthritis or any other rheumatologic condition but I need to get more detailed history. Her exam did show decreased strength in the left but there was a lack of effort. She has full range of motion of the left upper extremity.  Osteoarthritis certainly is a possibility but it is strange that she is describing symptoms in every single joint of her body. I am going to start with a plain film of the knees bilaterally as the need to take the brunt of her weight during ambulation. At her next appointment I will need to ask more about rheumatological causes and consider a rheumatoid factor and anti-CCP antibody. She gets her osteoarthritis of the neck causing nerve impingement that could describe the numbness in her hand at night. Alternatively, since she sleeps with that elbow bent, she just simply have a more distal nerve impingement temporarily. I will start with a neck film.  I do not want to prescribe opioid but do need to provide her pain relief. Voltaren  gel has been shown to be effective in knee osteoarthritis and I have prescribed that. I am also prescribing Cymbalta to be titrated from 30 mg to 60 mg as that can provide pain relief and chronic musculoskeletal pain. If this does not provide relief, we could do steroid injections of the knees and possibly even shoulder. I would also have the option of getting an MRI of the neck of the left arm continues to be significantly symptomatic.  PLAN : Plain film of knees bilaterally and neck Voltaren gel Cymbalta Follow-up in 2 months

## 2017-01-08 ENCOUNTER — Telehealth: Payer: Self-pay

## 2017-01-08 NOTE — Telephone Encounter (Signed)
Requesting lab and xray results. Please call pt back.

## 2017-01-08 NOTE — Telephone Encounter (Signed)
Dr Lynnae January,  What do I need to inform patient about her xrays-she's calling for the results.  Thank you.  Regenia Skeeter, Lewis Grivas Cassady5/22/20183:37 PM

## 2017-01-09 DIAGNOSIS — B961 Klebsiella pneumoniae [K. pneumoniae] as the cause of diseases classified elsewhere: Secondary | ICD-10-CM | POA: Diagnosis not present

## 2017-01-09 DIAGNOSIS — L02211 Cutaneous abscess of abdominal wall: Secondary | ICD-10-CM | POA: Diagnosis not present

## 2017-01-09 DIAGNOSIS — I480 Paroxysmal atrial fibrillation: Secondary | ICD-10-CM | POA: Diagnosis not present

## 2017-01-09 DIAGNOSIS — T814XXD Infection following a procedure, subsequent encounter: Secondary | ICD-10-CM | POA: Diagnosis not present

## 2017-01-09 DIAGNOSIS — I10 Essential (primary) hypertension: Secondary | ICD-10-CM | POA: Diagnosis not present

## 2017-01-09 DIAGNOSIS — L03311 Cellulitis of abdominal wall: Secondary | ICD-10-CM | POA: Diagnosis not present

## 2017-01-09 NOTE — Telephone Encounter (Signed)
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 Degenerative changes neck, might explain some L arm pain - can talk more at next appt. No changes in tx now.. Thanks!

## 2017-01-10 NOTE — Telephone Encounter (Signed)
Attempted to contact pt with results-no answer, message left on recorder.[name]5/24/20182:51 PM

## 2017-01-19 NOTE — Addendum Note (Signed)
Addendum  created 01/19/17 0803 by Duane Boston, MD   Sign clinical note

## 2017-01-31 ENCOUNTER — Ambulatory Visit (HOSPITAL_BASED_OUTPATIENT_CLINIC_OR_DEPARTMENT_OTHER): Payer: Self-pay | Admitting: Surgery

## 2017-01-31 ENCOUNTER — Emergency Department (HOSPITAL_COMMUNITY): Payer: Medicare Other

## 2017-01-31 ENCOUNTER — Emergency Department (HOSPITAL_COMMUNITY)
Admission: EM | Admit: 2017-01-31 | Discharge: 2017-01-31 | Disposition: A | Discharge status: Home / Self Care | Payer: Medicare Other | Attending: Emergency Medicine | Admitting: Emergency Medicine

## 2017-01-31 ENCOUNTER — Encounter (HOSPITAL_COMMUNITY): Payer: Self-pay

## 2017-01-31 DIAGNOSIS — M25552 Pain in left hip: Secondary | ICD-10-CM | POA: Insufficient documentation

## 2017-01-31 DIAGNOSIS — Z8541 Personal history of malignant neoplasm of cervix uteri: Secondary | ICD-10-CM | POA: Diagnosis not present

## 2017-01-31 DIAGNOSIS — Z87891 Personal history of nicotine dependence: Secondary | ICD-10-CM | POA: Diagnosis not present

## 2017-01-31 DIAGNOSIS — E785 Hyperlipidemia, unspecified: Secondary | ICD-10-CM | POA: Diagnosis not present

## 2017-01-31 DIAGNOSIS — J45909 Unspecified asthma, uncomplicated: Secondary | ICD-10-CM | POA: Insufficient documentation

## 2017-01-31 DIAGNOSIS — Z85038 Personal history of other malignant neoplasm of large intestine: Secondary | ICD-10-CM | POA: Diagnosis not present

## 2017-01-31 DIAGNOSIS — I480 Paroxysmal atrial fibrillation: Secondary | ICD-10-CM | POA: Insufficient documentation

## 2017-01-31 DIAGNOSIS — I10 Essential (primary) hypertension: Secondary | ICD-10-CM | POA: Diagnosis not present

## 2017-01-31 DIAGNOSIS — Z79899 Other long term (current) drug therapy: Secondary | ICD-10-CM | POA: Diagnosis not present

## 2017-01-31 MED ORDER — MORPHINE SULFATE (PF) 4 MG/ML IV SOLN
4.0000 mg | Freq: Once | INTRAVENOUS | Status: AC
Start: 2017-01-31 — End: 2017-01-31
  Administered 2017-01-31: 4 mg via INTRAMUSCULAR
  Filled 2017-01-31: qty 1

## 2017-01-31 MED ORDER — ACETAMINOPHEN 325 MG PO TABS: 650.0000 mg | ORAL_TABLET | Freq: Four times a day (QID) | ORAL | 0 refills | Status: AC | PRN

## 2017-01-31 NOTE — Telephone Encounter (Signed)
Received phone call from [facility] with Internal Med in [place] in regards to medical records. She states they have been trying to get this patients records for 7 months. I do not see a signed release from this office in the patients chart. I called her on 01/31/17 and informed her of this and she faxed me current signed release. Records from [employer] were copied and i hand delivered them to [facility] records due to the urgency of them needing these records since Kelsey [name] is currently hospitalized. I handed [employer] records along with release to Helene Kelp in med records and she stated that she would copy and fax them asap.

## 2017-01-31 NOTE — ED Provider Notes (Signed)
[facility] DEPT Provider Note   CSN: [id number] Arrival date & time: 01/31/17  1345  By signing my name below, I, Marcello Moores, attest that this documentation has been prepared under the direction and in the presence of Waynetta Pean, PA-C. Electronically Signed: Marcello Moores, ED Scribe. 01/31/17. 2:56 PM.  History   Chief Complaint No chief complaint on file.  The history is provided by the patient. No language interpreter was used.   HPI Comments: Suzanne Macdonald is a 66 y.o. female, with a PMHx of A-Fib on Eliquis, who presents to the Emergency Department complaining of constant left hip pain that began one week ago that is worse with movement. Denies fall. Her pain is exacerbated with movement and change in position. She states that she has taken tylenol before today with inadequate relief. The pt states that she has a walker, but usually gets around at home holding on to things.  She does not have an orthopedist. Pt denies fevers, falls, back pain, numbness, fevers, bowel/bladder incontinence, and weakness.   Past Medical History:  Diagnosis Date  . Abdominal wall abscess 09/24/2016  . Arthritis    "throughout my body" (08/23/2016)  . Asthma   . Atrial fibrillation ([place])   . Cervical cancer ([place]) 1972  . Chronic lower back pain   . Colon cancer ([place]) 2015  . GERD (gastroesophageal reflux disease)   . H/O gastric bypass   . History of blood transfusion 1972; 1990s   "both w/OR"  . Hyperlipidemia   . Hypertension   . Insomnia   . Morbid obesity ([place]) 03/04/2016  . OSA (obstructive sleep apnea)    "can't afford the machine" (08/23/2016)  . Pneumonia    "probably 4 times in early childhood" (08/23/2016)    Patient Active Problem List   Diagnosis Date Noted  . Chronic pain syndrome 01/03/2017  . Protein-calorie malnutrition ([place]) 10/11/2016  . Restless leg syndrome 10/11/2016  . Insomnia 10/11/2016  . GERD (gastroesophageal reflux disease) 10/11/2016  . Iron  deficiency anemia 08/24/2016  . Aortic atherosclerosis ([place]) 05/09/2016  . Morbid obesity ([place]) 05/04/2016  . Osteopenia 03/15/2016  . Asthma 03/02/2016  . Essential hypertension 01/25/2016  . Vitamin D deficiency 01/25/2016  . 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 Vitamin B12 deficiency 01/25/2016  . Paroxysmal atrial fibrillation ([place]) 01/25/2016  . Health care maintenance 01/25/2016  . History of colon cancer 01/25/2016  . Ventral hernia 01/25/2016  . History of cervical cancer 01/25/2016    Past Surgical History:  Procedure Laterality Date  . ABDOMINAL HERNIA REPAIR  1990s  . BOWEL RESECTION  2015   [place], [place] by Dr. Stormy Fabian  . CARDIAC CATHETERIZATION  05/2014   in [place], no sig CAD (done prior to surgery for colon CA)  . [place]   "twins"  . COLON SURGERY    . DEBRIDEMENT OF ABDOMINAL WALL ABSCESS Left 11/18/2016   Procedure: DEBRIDEMENT OF ABDOMINAL WALL ABSCESS;  Surgeon: Clovis Riley, MD;  Location: [facility];  Service: General;  Laterality: Left;  . DILATION AND CURETTAGE OF UTERUS  1960s X 1  . GASTRIC BYPASS  1992  . HERNIA REPAIR    . INSERTION OF MESH N/A 05/01/2016   Procedure: INSERTION OF MESH;  Surgeon: Excell Seltzer, MD;  Location: [facility];  Service: General;  Laterality: N/A;  . IR GENERIC HISTORICAL  06/28/2016   IR RADIOLOGIST EVAL & MGMT 06/28/2016 Markus Daft, MD [facility] INTERV RAD  . IR GENERIC HISTORICAL  08/07/2016   IR RADIOLOGIST EVAL & MGMT 08/07/2016 [facility] INTERV RAD  . LAPAROSCOPIC GASTRIC BANDING     Placed 2013 and removed in 2014.  Marland Kitchen TOTAL ABDOMINAL HYSTERECTOMY  1998   For tumorous growth.  Marland Kitchen VACUUM ASSISTED CLOSURE CHANGE N/A 09/24/2016   Procedure: Incision and Drainage Abdominal Wall Abscess;  Surgeon: Stark Klein, MD;  Location: [facility];  Service: General;  Laterality: N/A;  . VENTRAL HERNIA REPAIR N/A 05/01/2016   Procedure: VENTRAL HERNIA REPAIR;  Surgeon: Excell Seltzer, MD;  Location: [facility];  Service:  General;  Laterality: N/A;  . VENTRAL HERNIA REPAIR N/A 08/24/2016   Procedure: IRRIGATION AND DEBRIDEMENT OF ABDOMINAL WALL ABCESS;  Surgeon: Stark Klein, MD;  Location: [facility];  Service: General;  Laterality: N/A;    OB History    No data available       Home Medications    Prior to Admission medications   Medication Sig Start Date End Date Taking? Authorizing Provider  acetaminophen (TYLENOL) 325 MG tablet Take 2 tablets (650 mg total) by mouth every 6 (six) hours as needed for mild pain or moderate pain. 01/31/17   Waynetta Pean, PA-C  albuterol (PROVENTIL HFA;VENTOLIN HFA) 108 (90 Base) MCG/ACT inhaler Inhale 1-2 puffs into the lungs every 6 (six) hours as needed for wheezing or shortness of breath. 01/03/17   Bartholomew Crews, MD  Amino Acids-Protein Hydrolys (FEEDING SUPPLEMENT, PRO-STAT SUGAR FREE 64,) LIQD Take 30 mLs by mouth 2 (two) times daily. 11/20/16   Velna Ochs, MD  apixaban (ELIQUIS) 5 MG TABS tablet Take 1 tablet (5 mg total) by mouth 2 (two) times daily. 12/07/16   [name], MD  atorvastatin (LIPITOR) 20 MG tablet Take 1 tablet (20 mg total) by mouth daily. 12/11/16   [name], MD  budesonide-formoterol [facility]) 160-4.5 MCG/ACT inhaler Inhale 2 puffs into the lungs 2 (two) times daily. 01/03/17   Bartholomew Crews, MD  Calcium-Phosphorus-Vitamin D (CALCIUM/VITAMIN D3/ADULT GUMMY PO) Take 2 each by mouth daily.    [provider]  carvedilol (COREG) 6.25 MG tablet Take 3 tablets (18.75 mg total) by mouth 2 (two) times daily with a meal. 10/19/16   [name], MD  cephALEXin (KEFLEX) 500 MG capsule Take 1 capsule (500 mg total) by mouth 3 (three) times daily. 11/20/16   Velna Ochs, MD  cetirizine (ZYRTEC) 10 MG tablet Take 10 mg by mouth daily.    [provider]  Cholecalciferol (VITAMIN D3) 2000 units CHEW Chew 2,000 Units by mouth 2 (two) times daily.    [provider]  cyanocobalamin 500 MCG tablet Take  1,000 mcg by mouth daily.     [provider]  diclofenac sodium (VOLTAREN) 1 % GEL Apply 2 g topically 4 (four) times daily. 01/03/17   Bartholomew Crews, MD  diltiazem (CARDIZEM CD) 120 MG 24 hr capsule Take 1 capsule (120 mg total) by mouth daily. 07/13/16   Alphonzo Grieve, MD  diltiazem (CARDIZEM) 30 MG tablet Take 1 tablet every 4 hours AS NEEDED for AFIB fast heart rate >100 as long as blood pressure >100. 07/30/16   Sherran Needs, NP  docusate sodium (COLACE) 100 MG capsule Take 1 capsule (100 mg total) by mouth 2 (two) times daily as needed for mild constipation. 06/14/16   Ledell Noss, MD  DULoxetine (CYMBALTA) 30 MG capsule Take 1 pill once a day for 7 days then take 2 pills once a day. 01/03/17   Bartholomew Crews, MD  ferrous  sulfate 325 (65 FE) MG tablet Take 1 tablet (325 mg total) by mouth daily with breakfast. 09/26/16   Focht, Fraser Din, PA  flecainide (TAMBOCOR) 100 MG tablet take 1 tablet by mouth twice a day 10/29/16   [name], MD  lisinopril (PRINIVIL,ZESTRIL) 10 MG tablet Take 1 tablet (10 mg total) by mouth daily. 11/22/16   Bartholomew Crews, MD  loperamide (IMODIUM) 2 MG capsule Take 1 capsule (2 mg total) by mouth daily as needed for diarrhea or loose stools. 01/03/17   Bartholomew Crews, MD  mometasone (NASONEX) 50 MCG/ACT nasal spray Place 2 sprays into the nose daily. 01/03/17 01/03/18  Bartholomew Crews, MD  Multiple Vitamins-Minerals (ALIVE WOMENS GUMMY) CHEW Chew 2 each by mouth daily.    [provider]  omeprazole (PRILOSEC) 40 MG capsule take 1 capsule by mouth once daily 12/28/16   [name], MD  potassium chloride (K-DUR) 10 MEQ tablet take 2 tablets by mouth once daily 12/18/16   [name], MD  pregabalin (LYRICA) 150 MG capsule Take 1 capsule (150 mg total) by mouth at bedtime. Take 1 to 3 hours before bedtime 11/22/16   Bartholomew Crews, MD  traZODone (DESYREL) 100 MG tablet Take 1 tablet (100 mg total) by mouth  at bedtime. 10/11/16   Bartholomew Crews, MD    Family History Family History  Problem Relation Age of Onset  . Heart disease Mother   . Diabetes Mother   . Heart disease Father   . Stroke Father   . Diabetes Father   . Diabetes Sister   . Thyroid disease Sister   . Early death Brother        Killed by drunk driver  . Heart attack Brother   . Thyroid disease Daughter        s/p thyroidectomy  . Diabetes Sister   . Thyroid disease Sister   . Stroke Brother   . Drug abuse Brother   . Heart attack Brother     Social History Social History  Substance Use Topics  . Smoking status: Former Smoker    Packs/day: 1.00    Years: 45.00    Types: Cigarettes  . Smokeless tobacco: Never Used     Comment: "quit smoking mid 2000s"  . Alcohol use 0.0 oz/week     Comment: 08/23/2016 "a beer every now and then; none in over 1 year"     Allergies   Vancomycin; Scall[place] [shellfish allergy]; Augmentin [amoxicillin-pot clavulanate]; and Barium-containing compounds   Review of Systems Review of Systems  Constitutional: Negative for fever.  Gastrointestinal: Negative for abdominal pain and vomiting.  Musculoskeletal: Positive for arthralgias. Negative for back pain.  Skin: Negative for rash and wound.  Neurological: Negative for weakness, light-headedness and numbness.     Physical Exam Updated Vital Signs BP 116/63   Pulse (!) 57   Temp 98.7 F (37.1 C) (Oral)   Resp 18   SpO2 100%   Physical Exam  Constitutional: She appears well-developed and well-nourished. No distress.  Nontoxic appearing. Obese.  HENT:  Head: Normocephalic and atraumatic.  Eyes: Right eye exhibits no discharge. Left eye exhibits no discharge.  Cardiovascular: Intact distal pulses.   Bilateral dorsalis pedis and posterior tibialis pulses are intact.  Pulmonary/Chest: Effort normal. No respiratory distress.  Abdominal: Soft. There is no tenderness. There is no guarding.  Musculoskeletal: Normal range  of motion. She exhibits tenderness. She exhibits no edema or deformity.  Tenderness overlying the lateral aspect of  her left hip. No midline neck or back tenderness. No back erythema, deformity or ecchymosis. No overlying skin changes to her left hip. Good range of motion at her left hip and knee, alveolar pain. Patient able to stand and walk short distance in room. No calf edema or TTP.   Neurological: She is alert. No sensory deficit. Coordination normal.  Skin: Skin is warm and dry. Capillary refill takes less than 2 seconds. No rash noted. She is not diaphoretic. No erythema. No pallor.  Psychiatric: She has a normal mood and affect. Her behavior is normal.  Nursing note and vitals reviewed.    ED Treatments / Results   DIAGNOSTIC STUDIES: Oxygen Saturation is 100% on RA, normal by my interpretation.   COORDINATION OF CARE: 2:51 PM-Discussed next steps with pt. Pt verbalized understanding and is agreeable with the plan.   Labs (all labs ordered are listed, but only abnormal results are displayed) Labs Reviewed - No data to display  EKG  EKG Interpretation None       Radiology Dg Hip Unilat W Or Wo Pelvis 2-3 Views Left  Result Date: 01/31/2017 CLINICAL DATA:  Pain for 1 week EXAM: DG HIP (WITH OR WITHOUT PELVIS) 2-3V LEFT COMPARISON:  CT abdomen and pelvis with bony reformats November 16, 2016 FINDINGS: Frontal pelvis as well as frontal and lateral left hip images were obtained. There is no acute fracture or dislocation. There is mild symmetric narrowing of both hip joints. No erosive change. There is mild myositis ossificans slightly superior to each greater trochanter. IMPRESSION: No acute fracture or dislocation. Mild symmetric narrowing of both hip joints. Mild myositis ossificans slightly superior to each greater trochanter. Electronically Signed   By: Lowella Grip III M.D.   On: 01/31/2017 14:38    Procedures Procedures (including critical care time)  Medications  Ordered in ED Medications  morphine 4 MG/ML injection 4 mg (4 mg Intramuscular Given 01/31/17 1459)     Initial Impression / Assessment and Plan / ED Course  I have reviewed the triage vital signs and the nursing notes.  Pertinent labs & imaging results that were available during my care of the patient were reviewed by me and considered in my medical decision making (see chart for details).    This  is a 66 y.o. female, with a PMHx of A-Fib on Eliquis, who presents to the Emergency Department complaining of constant left hip pain that began one week ago that is worse with movement. Denies fall. Her pain is exacerbated with movement and change in position.  On exam the patient is afebrile nontoxic appearing. She is morbidly obese. Just tenderness over the lateral aspect of her left hip. She has good range of motion of her left hip and knee, albiet with pain. No midline neck or back tenderness. No overlying skin changes to her left hip or her left knee. She is able to stand and walk a short distance in the room. X-ray of her pelvis and left hip shows no acute fracture or dislocation. He does show mild symmetric narrowing of both hip joints.  We'll provide the patient with IM morphine in the emergency department. We'll have her use Tylenol as needed for pain control at home. No NSAIDs due to Eliquis use. Follow-up with primary care and orthopedic surgery. Suspect patient's weight is contributing to her hip pain. I advised the patient to follow-up with their primary care provider this week. I advised the patient to return to the emergency department with  new or worsening symptoms or new concerns. The patient verbalized understanding and agreement with plan.     Final Clinical Impressions(s) / ED Diagnoses   Final diagnoses:  Left hip pain    New Prescriptions New Prescriptions   ACETAMINOPHEN (TYLENOL) 325 MG TABLET    Take 2 tablets (650 mg total) by mouth every 6 (six) hours as needed for mild  pain or moderate pain.   I personally performed the services described in this documentation, which was scribed in my presence. The recorded information has been reviewed and is accurate.       Waynetta Pean, PA-C 01/31/17 1511    Tanna Furry, MD 02/04/17 [phone]

## 2017-01-31 NOTE — ED Notes (Signed)
Pt transported to xray 

## 2017-01-31 NOTE — ED Triage Notes (Signed)
Patient complains of left hip pain x 1 week. States that the pain is worse with movement and any change in position. denies any recent injury, NAD

## 2017-02-04 ENCOUNTER — Telehealth: Payer: Self-pay | Admitting: Gastroenterology

## 2017-02-04 NOTE — Telephone Encounter (Signed)
Dr.Nandigam reviewed records and accepted patient to be scheduled for an office visit first. Left message for patient to call back and schedule appointment.

## 2017-02-06 ENCOUNTER — Encounter: Payer: Self-pay | Admitting: Gastroenterology

## 2017-02-07 ENCOUNTER — Ambulatory Visit (INDEPENDENT_AMBULATORY_CARE_PROVIDER_SITE_OTHER): Payer: Medicare Other | Admitting: Internal Medicine

## 2017-02-07 ENCOUNTER — Encounter: Payer: Self-pay | Admitting: Internal Medicine

## 2017-02-07 DIAGNOSIS — D509 Iron deficiency anemia, unspecified: Secondary | ICD-10-CM

## 2017-02-07 DIAGNOSIS — Z6841 Body Mass Index (BMI) 40.0 and over, adult: Secondary | ICD-10-CM | POA: Diagnosis not present

## 2017-02-07 DIAGNOSIS — Z7901 Long term (current) use of anticoagulants: Secondary | ICD-10-CM

## 2017-02-07 DIAGNOSIS — G4700 Insomnia, unspecified: Secondary | ICD-10-CM

## 2017-02-07 DIAGNOSIS — G2581 Restless legs syndrome: Secondary | ICD-10-CM | POA: Diagnosis not present

## 2017-02-07 DIAGNOSIS — I480 Paroxysmal atrial fibrillation: Secondary | ICD-10-CM | POA: Diagnosis not present

## 2017-02-07 DIAGNOSIS — I10 Essential (primary) hypertension: Secondary | ICD-10-CM

## 2017-02-07 DIAGNOSIS — Z0000 Encounter for general adult medical examination without abnormal findings: Secondary | ICD-10-CM

## 2017-02-07 DIAGNOSIS — F5101 Primary insomnia: Secondary | ICD-10-CM

## 2017-02-07 DIAGNOSIS — G894 Chronic pain syndrome: Secondary | ICD-10-CM | POA: Diagnosis not present

## 2017-02-07 DIAGNOSIS — Z87891 Personal history of nicotine dependence: Secondary | ICD-10-CM

## 2017-02-07 DIAGNOSIS — Z79899 Other long term (current) drug therapy: Secondary | ICD-10-CM

## 2017-02-07 DIAGNOSIS — M1712 Unilateral primary osteoarthritis, left knee: Secondary | ICD-10-CM | POA: Diagnosis not present

## 2017-02-07 MED ORDER — TRAZODONE HCL 100 MG PO TABS: 200.0000 mg | ORAL_TABLET | Freq: Every day | ORAL | 1 refills | Status: DC

## 2017-02-07 NOTE — Patient Instructions (Signed)
1. Let me know before you leave for [facility]

## 2017-02-07 NOTE — Progress Notes (Signed)
Knee Arthrocentesis with Injection Procedure Note  Diagnosis: left knee osteoarthritis  Indications: Symptom relief from osteoarthritis  Anesthesia: Lidocaine 1% without epinephrine  Procedure Details   I was asked by Dr. Lynnae January to provide a left knee steroid injection for symptomatic relief of ongoing left knee osteoarthritis. The patient has morbid obesity, the anatomy of the knee was effaced by significant adiposity. With ultrasound we were able to better define the patella, quad tendon, and a small effusion in the suprapatellar pouch.   Point of care ultrasound was used to identify the joint effusion and plan needle trajectory and depth. Consent was obtained for the procedure. The joint was prepped with Betadine. A 22 gauge needle was inserted into the superior aspect of the joint from a medial approach to access the suprapatellar pouch. There was only trace effusion, so no bursa fluid was removed. 2 ml 1% lidocaine and 40 mg of Triamcinolone was then injected into the joint. The needle was removed and the area cleansed and dressed.  Complications:  None; patient tolerated the procedure well.   Long axis view of the left knee showing the patella, quad tendon, and a faint trace effusion in the suprapatellar bursa.

## 2017-02-07 NOTE — Assessment & Plan Note (Signed)
This problem is chronic and stable. She is on lisinopril 10 mg, Coreg 18.75 twice a day, and diltiazem 120 mg. Her blood pressure is at goal. She has no side effects to these medications. Her BMP is up-to-date.  PLAN:  Cont current meds   BP Readings from Last 3 Encounters:  02/07/17 123/89  01/31/17 (!) 123/54  01/03/17 (!) 150/67

## 2017-02-07 NOTE — Assessment & Plan Note (Signed)
This problem is chronic and stable. Her last hemoglobin was 9.6 in April of this year and her ferritin was 36 in February. I need to recheck both labs but had been delaying due to the ongoing infection and wound healing. She is now pretty much at a steady state but I elected to wait until I needed other lab test as this is not an emergency.  PLAN :  hemoglobin and ferritin next lab draw

## 2017-02-07 NOTE — Assessment & Plan Note (Signed)
This problem is chronic and stable. I printed off her x-ray reports and reviewed them with her and gave her the paper copies for her records. Basically, they showed mild arthritic changes of the knees bilaterally, hip, and her neck. At her last appointment, I started her on duloxetine and titrated up to the max dose of 60 mg. At first, she stated this medicine was not doing anything and I provided her with a taper to stop the medication. Later on, she: Me that overall she is feeling much better and her mood is much better and thinks that it would be beneficial to continue the duloxetine. I have no issue with this and will continue duloxetine 60 mg once a day. She does get relief from the voltarin gel in regards to her knee pain although it is not long lasting.  Overall, it seemed that her left knee was giving her the most problem. She has to use a cane to support that leg when she walks and also needs her husband to lift that leg up when she enters a car. She stated she wanted a knee replacement and I discussed that most orthopedics would like to see her lose a little bit of weight and improve her mobility prior to undergoing a knee replacement. I discussed steroid injection of the left knee and she consented to have this done. Dr. Evette Doffing performed procedure ultrasound-guided and has left a procedure note. I am avoiding opioids as this is a chronic issue and I do not think osteoarthritis is the only etiology for her pain.  A/P :  Continue duloxetine 60 mg once a day Continue Voltaren gel to the knees bilaterally Steroid injection to left knee done today Assess response to above

## 2017-02-07 NOTE — Assessment & Plan Note (Signed)
This problem is chronic and stable. She uses trazodone 100 mg pills and takes 2 pills at bedtime. She is requesting a refill of this medication. She also uses pregabalin to treat her and her diagnosis of restless leg syndrome and she feels that this decreases the sensation that there is something crawling on her legs. The pregabalin does not help her sleep which is quite she continues to use the trazodone. I ensured that there is no significant interaction between trazodone, pregabalin, and duloxetine. There is a slight increase in the risk of serotonin syndrome but the benefits of each medication outweigh the risk.  PLAN:  Cont current meds

## 2017-02-07 NOTE — Progress Notes (Signed)
   Subjective:    Patient ID: Suzanne Macdonald, female    DOB: 05-Apr-1951, 66 y.o.   MRN: [id number]  HPI  Suzanne Macdonald is here for pain F/U. Please see the A&P for the status of the pt's chronic medical problems.  ROS : per ROS section and in problem oriented charting. All other systems are negative.  PMHx, Soc hx, and / or Fam hx : Suzanne Macdonald and her husband have decided to move back to [facility] in about 6 months. They have lived in [place] for about 2 years to be close to their daughter and grand daughter. They have their own home which is one story with ramps at the front and the [place]. [place] to in nature. Her husband, Nadara Mustard, is having a difficult recuperation from his back surgery. Both are attending on on line colleges. Suzanne. Macdonald is studying sociology and her husband is doing criminal justice.   Review of Systems  Cardiovascular: Positive for palpitations.  Musculoskeletal: Positive for arthralgias, back pain, gait problem and joint swelling.  Skin: Positive for wound.  Psychiatric/Behavioral: Positive for sleep disturbance.       Objective:   Physical Exam  Constitutional: She appears well-developed and well-nourished. No distress.  HENT:  Head: Normocephalic and atraumatic.  Right Ear: External ear normal.  Left Ear: External ear normal.  Nose: Nose normal.  Eyes: Conjunctivae and EOM are normal. Right eye exhibits no discharge. Left eye exhibits no discharge.  Cardiovascular: Normal rate, regular rhythm and normal heart sounds.   Musculoskeletal: She exhibits no edema, tenderness or deformity.  Knee examination limited by body habitus  Neurological: She is alert.  Skin: Skin is warm and dry. She is not diaphoretic.  Psychiatric: She has a normal mood and affect. Her behavior is normal. Judgment and thought content normal.          Assessment & Plan:

## 2017-02-07 NOTE — Assessment & Plan Note (Signed)
She has an appointment on August 7 to see [facility] to discuss colonoscopy as she has had colon cancer in the past.

## 2017-02-07 NOTE — Assessment & Plan Note (Signed)
This problem is chronic and stable. This is affecting her ambulation and contributing to her chronic pain. She is only able to stand for 3 - 4 minutes before having to sit when she is cooking in the kitchen. However, she stated she was going to be weed easting this afternoon which would require standing for longer than 4 minutes.  PLAN : support and continue to follow   Wt Readings from Last 3 Encounters:  02/07/17 291 lb 9.6 oz (132.3 kg)  01/03/17 287 lb 3.2 oz (130.3 kg)  12/13/16 280 lb 9.6 oz (127.3 kg)

## 2017-02-07 NOTE — Assessment & Plan Note (Signed)
See documentation under insomnia. Continue pregabalin 150 daily at bedtime.

## 2017-02-07 NOTE — Assessment & Plan Note (Signed)
This problem is chronic and stable. She is on a beta blocker for rate control. She is on Eliquis for anticoagulation b her CHADSVASC score is 3. Cardiology also has her on flecainide when necessary for when she doesn't of palpitations. She has not had palpitations recently and has not required the use of flecainide. Today, she is in sinus.  PLAN:  Cont current meds

## 2017-02-19 ENCOUNTER — Encounter: Payer: Self-pay | Admitting: Pharmacist

## 2017-02-19 NOTE — Progress Notes (Signed)
Suzanne Macdonald is a 66 y.o. female who was reviewed for monitoring of apixaban (Eliquis) therapy.    ASSESSMENT Indication(s): atrial fibrillation CHA2DS2VASC score 4, HAS-BLED 1 Duration: indefinite  Labs: Component Value Date/Time   AST 19 11/16/2016 1347   ALT 21 11/16/2016 1347   NA 139 11/19/2016 0357   NA 143 01/24/2016 0952   K 3.8 11/19/2016 0357   CL 105 11/19/2016 0357   CO2 24 11/19/2016 0357   GLUCOSE 96 11/19/2016 0357   HGBA1C 6.1 01/03/2017 0858   BUN 15 11/19/2016 0357   BUN 19 01/24/2016 0952   CREATININE 0.78 11/19/2016 0357   CALCIUM 8.2 (L) 11/19/2016 0357   GFRNONAA >60 11/19/2016 0357   WBC 5.6 11/20/2016 0745   HGB 9.6 (L) 11/20/2016 0745   HGB 11.8 01/24/2016 0952   HCT 30.8 (L) 11/20/2016 0745   HCT 37.3 01/24/2016 0952   PLT 252 11/20/2016 0745   PLT 279 01/24/2016 0952   apixaban (Eliquis) Dose: 5 mg BID  No concerns at this time, pharmacy records indicate consistent fill history  Flossie Dibble Clinical Pharmacist  02/19/2017, 4:06 PM

## 2017-02-28 ENCOUNTER — Telehealth: Payer: Self-pay | Admitting: *Deleted

## 2017-02-28 ENCOUNTER — Other Ambulatory Visit: Payer: Self-pay | Admitting: Cardiovascular Disease

## 2017-02-28 NOTE — Telephone Encounter (Signed)
Refill Request.  

## 2017-02-28 NOTE — Telephone Encounter (Signed)
Phone note started in error when checking referrals follow up - no phone call made or received. Yvonna Alanis, RN, 02/28/17-5:32P

## 2017-03-26 ENCOUNTER — Ambulatory Visit: Payer: Medicare Other | Admitting: Gastroenterology

## 2017-04-24 ENCOUNTER — Other Ambulatory Visit: Payer: Self-pay | Admitting: Internal Medicine

## 2017-04-24 ENCOUNTER — Other Ambulatory Visit: Payer: Self-pay | Admitting: Cardiovascular Disease

## 2017-04-24 NOTE — Telephone Encounter (Signed)
Please review for refill, thanks ! 

## 2017-04-24 NOTE — Telephone Encounter (Signed)
REFILL 

## 2017-05-02 NOTE — Addendum Note (Signed)
Addended by: Hulan Fray on: 05/02/2017 01:25 PM   Modules accepted: Orders

## 2017-06-04 ENCOUNTER — Other Ambulatory Visit: Payer: Self-pay | Admitting: *Deleted

## 2017-06-04 MED ORDER — PREGABALIN 150 MG PO CAPS: 150.0000 mg | ORAL_CAPSULE | Freq: Every day | ORAL | 0 refills | Status: AC

## 2017-07-04 DIAGNOSIS — Z23 Encounter for immunization: Secondary | ICD-10-CM | POA: Diagnosis not present

## 2017-07-04 DIAGNOSIS — H9201 Otalgia, right ear: Secondary | ICD-10-CM | POA: Diagnosis not present

## 2017-07-16 ENCOUNTER — Other Ambulatory Visit: Payer: Self-pay | Admitting: Internal Medicine

## 2017-07-20 ENCOUNTER — Other Ambulatory Visit: Payer: Self-pay

## 2017-07-23 ENCOUNTER — Other Ambulatory Visit: Payer: Self-pay | Admitting: Cardiovascular Disease

## 2017-07-23 ENCOUNTER — Other Ambulatory Visit: Payer: Self-pay | Admitting: Oncology

## 2017-07-23 NOTE — Telephone Encounter (Signed)
Please review for refill, thanks ! 

## 2017-07-24 DIAGNOSIS — I219 Acute myocardial infarction, unspecified: Secondary | ICD-10-CM | POA: Diagnosis not present

## 2017-07-24 DIAGNOSIS — J45909 Unspecified asthma, uncomplicated: Secondary | ICD-10-CM | POA: Diagnosis not present

## 2017-07-24 DIAGNOSIS — E785 Hyperlipidemia, unspecified: Secondary | ICD-10-CM | POA: Diagnosis not present

## 2017-07-24 DIAGNOSIS — C189 Malignant neoplasm of colon, unspecified: Secondary | ICD-10-CM | POA: Diagnosis not present

## 2017-07-24 DIAGNOSIS — Z0000 Encounter for general adult medical examination without abnormal findings: Secondary | ICD-10-CM | POA: Diagnosis not present

## 2017-07-24 DIAGNOSIS — C539 Malignant neoplasm of cervix uteri, unspecified: Secondary | ICD-10-CM | POA: Diagnosis not present

## 2017-07-24 DIAGNOSIS — G4730 Sleep apnea, unspecified: Secondary | ICD-10-CM | POA: Diagnosis not present

## 2017-07-24 DIAGNOSIS — M2550 Pain in unspecified joint: Secondary | ICD-10-CM | POA: Diagnosis not present

## 2017-07-24 DIAGNOSIS — M1990 Unspecified osteoarthritis, unspecified site: Secondary | ICD-10-CM | POA: Diagnosis not present

## 2017-07-24 DIAGNOSIS — G2581 Restless legs syndrome: Secondary | ICD-10-CM | POA: Diagnosis not present

## 2017-07-24 DIAGNOSIS — I10 Essential (primary) hypertension: Secondary | ICD-10-CM | POA: Diagnosis not present

## 2017-07-24 DIAGNOSIS — I4891 Unspecified atrial fibrillation: Secondary | ICD-10-CM | POA: Diagnosis not present

## 2017-07-25 ENCOUNTER — Other Ambulatory Visit (HOSPITAL_COMMUNITY): Payer: Self-pay | Admitting: EXTERNAL

## 2017-07-25 DIAGNOSIS — I219 Acute myocardial infarction, unspecified: Secondary | ICD-10-CM

## 2017-07-25 DIAGNOSIS — I480 Paroxysmal atrial fibrillation: Secondary | ICD-10-CM

## 2017-08-05 ENCOUNTER — Ambulatory Visit (HOSPITAL_BASED_OUTPATIENT_CLINIC_OR_DEPARTMENT_OTHER): Payer: Medicare Other

## 2017-08-08 ENCOUNTER — Ambulatory Visit (INDEPENDENT_AMBULATORY_CARE_PROVIDER_SITE_OTHER): Payer: Self-pay | Admitting: Hematology & Oncology

## 2017-08-08 ENCOUNTER — Encounter (INDEPENDENT_AMBULATORY_CARE_PROVIDER_SITE_OTHER): Payer: Self-pay

## 2017-08-09 ENCOUNTER — Other Ambulatory Visit (HOSPITAL_BASED_OUTPATIENT_CLINIC_OR_DEPARTMENT_OTHER): Payer: Self-pay | Admitting: Surgery

## 2017-08-22 ENCOUNTER — Other Ambulatory Visit: Payer: Self-pay | Admitting: Cardiovascular Disease

## 2017-08-22 NOTE — Telephone Encounter (Signed)
Please review for refill, Thanks !  

## 2017-08-23 ENCOUNTER — Encounter (INDEPENDENT_AMBULATORY_CARE_PROVIDER_SITE_OTHER): Payer: Self-pay

## 2017-08-23 ENCOUNTER — Ambulatory Visit (INDEPENDENT_AMBULATORY_CARE_PROVIDER_SITE_OTHER): Payer: Self-pay | Admitting: Cardiovascular Disease

## 2017-09-19 ENCOUNTER — Encounter: Payer: Self-pay | Admitting: Internal Medicine

## 2017-09-23 ENCOUNTER — Ambulatory Visit (HOSPITAL_BASED_OUTPATIENT_CLINIC_OR_DEPARTMENT_OTHER): Payer: Medicare Other

## 2017-09-23 ENCOUNTER — Encounter (HOSPITAL_BASED_OUTPATIENT_CLINIC_OR_DEPARTMENT_OTHER): Payer: Self-pay

## 2017-09-25 ENCOUNTER — Other Ambulatory Visit (HOSPITAL_BASED_OUTPATIENT_CLINIC_OR_DEPARTMENT_OTHER): Payer: Self-pay | Admitting: Physician Assistant

## 2017-09-25 DIAGNOSIS — F5104 Psychophysiologic insomnia: Secondary | ICD-10-CM | POA: Diagnosis not present

## 2017-09-25 DIAGNOSIS — G2581 Restless legs syndrome: Secondary | ICD-10-CM | POA: Diagnosis not present

## 2017-09-25 DIAGNOSIS — M25562 Pain in left knee: Secondary | ICD-10-CM

## 2017-09-25 DIAGNOSIS — M25561 Pain in right knee: Principal | ICD-10-CM

## 2017-09-26 DIAGNOSIS — H2513 Age-related nuclear cataract, bilateral: Secondary | ICD-10-CM | POA: Diagnosis not present

## 2017-09-28 ENCOUNTER — Ambulatory Visit (HOSPITAL_BASED_OUTPATIENT_CLINIC_OR_DEPARTMENT_OTHER)
Admission: RE | Admit: 2017-09-28 | Discharge: 2017-09-28 | Disposition: A | Discharge status: Home / Self Care | Payer: Medicare Other | Source: Ambulatory Visit | Admitting: Radiology

## 2017-09-28 ENCOUNTER — Ambulatory Visit (HOSPITAL_BASED_OUTPATIENT_CLINIC_OR_DEPARTMENT_OTHER): Payer: Medicare Other | Admitting: Physician Assistant

## 2017-09-28 ENCOUNTER — Ambulatory Visit
Admission: RE | Admit: 2017-09-28 | Discharge: 2017-09-28 | Disposition: A | Discharge status: Home / Self Care | Payer: Medicare Other | Source: Ambulatory Visit | Attending: Physician Assistant | Admitting: Physician Assistant

## 2017-09-28 ENCOUNTER — Encounter (HOSPITAL_BASED_OUTPATIENT_CLINIC_OR_DEPARTMENT_OTHER): Payer: Self-pay | Admitting: Physician Assistant

## 2017-09-28 VITALS — Ht 63.0 in | Wt 302.0 lb

## 2017-09-28 DIAGNOSIS — M25561 Pain in right knee: Secondary | ICD-10-CM | POA: Diagnosis not present

## 2017-09-28 DIAGNOSIS — M170 Bilateral primary osteoarthritis of knee: Secondary | ICD-10-CM | POA: Diagnosis not present

## 2017-09-28 DIAGNOSIS — M8588 Other specified disorders of bone density and structure, other site: Secondary | ICD-10-CM | POA: Diagnosis not present

## 2017-09-28 DIAGNOSIS — M85861 Other specified disorders of bone density and structure, right lower leg: Secondary | ICD-10-CM | POA: Diagnosis not present

## 2017-09-28 DIAGNOSIS — M85862 Other specified disorders of bone density and structure, left lower leg: Secondary | ICD-10-CM | POA: Diagnosis not present

## 2017-09-28 DIAGNOSIS — M1712 Unilateral primary osteoarthritis, left knee: Secondary | ICD-10-CM | POA: Diagnosis not present

## 2017-09-28 DIAGNOSIS — R29898 Other symptoms and signs involving the musculoskeletal system: Secondary | ICD-10-CM | POA: Diagnosis not present

## 2017-09-28 DIAGNOSIS — M25562 Pain in left knee: Secondary | ICD-10-CM | POA: Diagnosis not present

## 2017-09-30 NOTE — H&P (Signed)
PATIENT NAME: Kelsey [name] NUMBER:  [id number]  DATE OF SERVICE: 10/01/2017  DATE OF BIRTH:  Dec 24, 1950    HISTORY AND PHYSICAL    CHIEF COMPLAINT:  This 67 year old white female presented to my office with complaints of a decrease in vision in both eyes.    PAST MEDICAL HISTORY:  Illnesses:   1. Hypertension.    2. Hyperlipidemia.   3. Gastroesophageal reflux disease.    MEDICATIONS:  1. Lipitor 10 mg daily.    2. Symbicort 160-4.5 mcg per actuation 2 puffs b.i.d.   3. Cardizem CD 120 mg daily.    4. Colace 100 mg t.i.d.    5. Hydrochlorothiazide 25 mg daily.    6. Metoprolol 25 mg b.i.d.    7. Prilosec 20 mg daily.    8. Potassium chloride 20 mEq daily.    9. Compazine 10 mg q.i.d.   10. Desyrel 100 mg daily.    11. Vitamin B complex unknown frequency.    12. Vitamin E 400 units daily.    ALLERGIES:  1. SHELLFISH PRODUCTS.    2. IODINE.    REVIEW OF SYSTEMS:  1. Colon cancer by history.   2. Arthritis.    SOCIAL HISTORY:  The patient lives with a family member and denies use of alcohol and/or tobacco.    PHYSICAL EXAMINATION:  My most recent eye exam showed a visual acuity, without glasses, of 20/60 on the right, and 20/50 on the left     PRESENT GLASSES:   None.     MANIFEST REFRACTION:   Right eye: -0.25 sphere for a best corrected vision of 20/60.   Left eye: -0.25 sphere for a best corrected vision of 20/50.     INTRAOCULAR PRESSURE:  Soft to digital palpation in each eye.     EXTERNAL:  Normal in both eyes.     VISUAL FIELDS:  Full to finger count in each eye.      PUPILS:  Equal, round, and reactive with no afferent pupillary defect.     MOTILITY:  Orthophoric (straight) with a full range of motion.     ANTERIOR SEGMENT EXAMINATION:  Normal in both eyes with a grade 5 to 6/10 nuclear sclerotic cataract in each eye.     FUNDUS:  Dilated fundus exam clear vitreous with a healthy-appearing optic nerve head, cup-to-disc ratio of 0.1 in each eye with normal vessels, macula, and periphery in both  eyes.    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    PLAN:  Proceed with cataract surgery in the right eye at the [facility], on October 01, 2017. with a 23.5 diopter [id number] posterior chamber intraocular lens implant in the right eye.    PER ASSESSMENT:  1. The patient's impairment of visual function is believed not to be correctable with a tolerable change in glasses or contact lenses.  2. As such, the patient desires surgical correction.  3. The risks, benefits and alternatives have been explained to the patient in detail, and reasonable expectation exists that lens surgery will significantly improve both the visual and functional status of the patient.  4. If other ocular diseases are present, the cataract is the primary reason for this surgery.        Charlyne Mom, MD                DD:  09/30/2017 21:41:27  DT:  09/30/2017 22:29:47 ES  D#:  [id number]

## 2017-10-01 ENCOUNTER — Encounter (HOSPITAL_COMMUNITY)
Admission: RE | Disposition: A | Discharge status: Home / Self Care | Payer: Self-pay | Source: Ambulatory Visit | Attending: Ophthalmology

## 2017-10-01 ENCOUNTER — Ambulatory Visit (HOSPITAL_COMMUNITY): Payer: Medicare Other | Admitting: Ophthalmology

## 2017-10-01 ENCOUNTER — Inpatient Hospital Stay
Admission: RE | Admit: 2017-10-01 | Discharge: 2017-10-01 | Disposition: A | Discharge status: Home / Self Care | Payer: Medicare Other | Source: Ambulatory Visit | Attending: Ophthalmology | Admitting: Ophthalmology

## 2017-10-01 ENCOUNTER — Encounter (HOSPITAL_COMMUNITY): Payer: Self-pay

## 2017-10-01 ENCOUNTER — Ambulatory Visit (HOSPITAL_BASED_OUTPATIENT_CLINIC_OR_DEPARTMENT_OTHER): Payer: Medicare Other | Admitting: Anesthesiology

## 2017-10-01 ENCOUNTER — Ambulatory Visit (HOSPITAL_COMMUNITY): Payer: Medicare Other | Admitting: Anesthesiology

## 2017-10-01 ENCOUNTER — Other Ambulatory Visit (HOSPITAL_BASED_OUTPATIENT_CLINIC_OR_DEPARTMENT_OTHER): Payer: Self-pay | Admitting: Surgery

## 2017-10-01 DIAGNOSIS — M1990 Unspecified osteoarthritis, unspecified site: Secondary | ICD-10-CM | POA: Diagnosis not present

## 2017-10-01 DIAGNOSIS — I10 Essential (primary) hypertension: Secondary | ICD-10-CM | POA: Diagnosis not present

## 2017-10-01 DIAGNOSIS — Z79899 Other long term (current) drug therapy: Secondary | ICD-10-CM | POA: Diagnosis not present

## 2017-10-01 DIAGNOSIS — J449 Chronic obstructive pulmonary disease, unspecified: Secondary | ICD-10-CM | POA: Diagnosis not present

## 2017-10-01 DIAGNOSIS — H2511 Age-related nuclear cataract, right eye: Secondary | ICD-10-CM | POA: Diagnosis not present

## 2017-10-01 DIAGNOSIS — E785 Hyperlipidemia, unspecified: Secondary | ICD-10-CM | POA: Diagnosis not present

## 2017-10-01 DIAGNOSIS — Z7901 Long term (current) use of anticoagulants: Secondary | ICD-10-CM | POA: Diagnosis not present

## 2017-10-01 DIAGNOSIS — Z85038 Personal history of other malignant neoplasm of large intestine: Secondary | ICD-10-CM | POA: Diagnosis not present

## 2017-10-01 DIAGNOSIS — K219 Gastro-esophageal reflux disease without esophagitis: Secondary | ICD-10-CM | POA: Diagnosis not present

## 2017-10-01 DIAGNOSIS — Z85828 Personal history of other malignant neoplasm of skin: Secondary | ICD-10-CM | POA: Diagnosis not present

## 2017-10-01 DIAGNOSIS — I4891 Unspecified atrial fibrillation: Secondary | ICD-10-CM | POA: Diagnosis not present

## 2017-10-01 DIAGNOSIS — G4730 Sleep apnea, unspecified: Secondary | ICD-10-CM | POA: Diagnosis not present

## 2017-10-01 DIAGNOSIS — Z9884 Bariatric surgery status: Secondary | ICD-10-CM | POA: Diagnosis not present

## 2017-10-01 DIAGNOSIS — Z6841 Body Mass Index (BMI) 40.0 and over, adult: Secondary | ICD-10-CM | POA: Diagnosis not present

## 2017-10-01 DIAGNOSIS — Z8541 Personal history of malignant neoplasm of cervix uteri: Secondary | ICD-10-CM | POA: Diagnosis not present

## 2017-10-01 SURGERY — PHACO WITH INTRAOCULAR LENS
Anesthesia: Monitor Anesthesia Care | Laterality: Right | Wound class: Clean Wound: Uninfected operative wounds in which no inflammation occurred

## 2017-10-01 MED ORDER — CYCLOPENTOLATE 1 % EYE DROPS
1.00 [drp] | OPHTHALMIC | Status: AC
Start: 2017-10-01 — End: 2017-10-01
  Administered 2017-10-01 (×3): 1 [drp] via OPHTHALMIC
  Filled 2017-10-01: qty 2

## 2017-10-01 MED ORDER — CHONDROITIN-SOD HYALURON 3 %-4 %(0.5 ML)1 %(0.55 ML)INTRAOCULAR SYRING
INJECTION | Freq: Once | INTRAOCULAR | Status: DC | PRN
Start: 2017-10-01 — End: 2017-10-01
  Administered 2017-10-01 (×2): 1 via OPHTHALMIC

## 2017-10-01 MED ORDER — PROPARACAINE 0.5 % EYE DROPS
1.00 [drp] | Freq: Once | OPHTHALMIC | Status: AC
Start: 2017-10-01 — End: 2017-10-01
  Administered 2017-10-01: 1 [drp] via OPHTHALMIC
  Filled 2017-10-01: qty 15

## 2017-10-01 MED ORDER — LIDOCAINE 1 %-PHENYLEPHRINE 1.5 % IN STERILE WATER INTRAOCULAR SOLN
Freq: Once | INTRAOCULAR | Status: DC | PRN
Start: 2017-10-01 — End: 2017-10-01
  Administered 2017-10-01 (×2): 1 mL via OPHTHALMIC

## 2017-10-01 MED ORDER — LACTATED RINGERS INTRAVENOUS SOLUTION
INTRAVENOUS | Status: DC
Start: 2017-10-01 — End: 2017-10-01

## 2017-10-01 MED ORDER — VANCOMYCIN 500 MG INTRAVENOUS SOLUTION
Freq: Once | INTRAVENOUS | Status: DC | PRN
Start: 2017-10-01 — End: 2017-10-01

## 2017-10-01 MED ORDER — SODIUM CHLORIDE 0.9 % (FLUSH) INJECTION SYRINGE
3.0000 mL | INJECTION | INTRAMUSCULAR | Status: DC | PRN
Start: 2017-10-01 — End: 2017-10-01
  Administered 2017-10-01: 3 mL

## 2017-10-01 MED ORDER — BALANCED SALT SOLUTION COMBINATION NO.2 INTRAOCULAR IRRIGATION
Freq: Once | INTRAOCULAR | Status: DC | PRN
Start: 2017-10-01 — End: 2017-10-01
  Administered 2017-10-01: 15 mL via OPHTHALMIC

## 2017-10-01 MED ORDER — FENTANYL (PF) 50 MCG/ML INJECTION SOLUTION
INTRAMUSCULAR | Status: AC
Start: 2017-10-01 — End: 2017-10-01
  Filled 2017-10-01: qty 2

## 2017-10-01 MED ORDER — PHENYLEPHRINE 2.5 % EYE DROPS
1.00 [drp] | OPHTHALMIC | Status: AC
Start: 2017-10-01 — End: 2017-10-01
  Administered 2017-10-01 (×3): 1 [drp] via OPHTHALMIC
  Filled 2017-10-01: qty 15

## 2017-10-01 MED ORDER — MIDAZOLAM 1 MG/ML INJECTION SOLUTION
Freq: Once | INTRAMUSCULAR | Status: DC | PRN
Start: 2017-10-01 — End: 2017-10-01
  Administered 2017-10-01 (×2): 1 mg via INTRAVENOUS
  Administered 2017-10-01: 2 mg via INTRAVENOUS

## 2017-10-01 MED ORDER — LIDOCAINE HCL 2 % MUCOSAL JELLY
Freq: Once | Status: DC | PRN
Start: 2017-10-01 — End: 2017-10-01
  Administered 2017-10-01: 5 mL via TOPICAL

## 2017-10-01 MED ORDER — LIDOCAINE HCL 2 % MUCOSAL JELLY
0.50 mL | Status: DC
Start: 2017-10-01 — End: 2017-10-01
  Administered 2017-10-01: 0.5 mL via TOPICAL
  Filled 2017-10-01: qty 5

## 2017-10-01 MED ORDER — MOXIFLOXACIN 0.5 % EYE DROPS
Freq: Once | OPHTHALMIC | Status: DC | PRN
Start: 2017-10-01 — End: 2017-10-01
  Administered 2017-10-01: 1 [drp] via OPHTHALMIC

## 2017-10-01 MED ADMIN — sodium chloride 0.9 % (flush) injection syringe: @ 07:00:00

## 2017-10-01 SURGICAL SUPPLY — 16 items
APPL COTTON WD 6IN SWBSTK STRL LF  DISP (WOUND CARE SUPPLY) ×1 IMPLANT
APPL FBRTP 6IN STRL LF COTTON_WD (WOUND CARE/ENTEROSTOMAL SUPPLY) ×1
BLUE MICROSCOPE HANDLES (INSTRUMENTS) ×1
BLUE MICROSCOPE HANDLES (SURGICAL INSTRUMENTS) ×1 IMPLANT
CUP MEDICINE (CUP) ×2 IMPLANT
LABEL STRL EYE_C3333UHCE 100ST/BX (LABELS/CHART SUPPLIES) ×2 IMPLANT
LENS IOL 0 D +23.5 DIOP MOD L_BICONVEX ACRYSOF NATURAL (Lens) ×1 IMPLANT
NEEDLE HYPO  21GA 1.5IN TW SFGLD POLYPROP REG BVL LL SHIELD MECH DEHP-FR IM GRN STRL LF  DISP (NEEDLES & SYRINGE SUPPLIES) ×1 IMPLANT
NEEDLE HYPO 21GA 1.5IN TW SFG_LD POLYPROP REG BVL LL HUB (NEEDLES & SYRINGE SUPPLIES) ×1
SLEEVE OPTH .9MM ALCON FMS ABS INTREPID 45D BAL TIP STRL LF (SUPP) ×1 IMPLANT
SLEEVE OPTH FMS ABS 45 D 9MM_INTREPID BAL TIP STRL LF (SUPP) ×1
SOLUTION BSS 500ML (SUPP) ×2 IMPLANT
SUTURE NYL 10-0 AU-1 12IN BLK 2 ARM MONOF NONAB (SUTURE/WOUND CLOSURE) IMPLANT
SUTURE NYL 10-0 AU-1 12IN BLK_2 ARM MONOF NONAB (SUTURE/WOUND CLOSURE)
TRAY EYE CUSTOM DR FARIS_AS358641 6EA/CS (TRAY) ×2 IMPLANT
WIPE INSTR ST 90910 SP/20 (CLEN) ×2 IMPLANT

## 2017-10-01 NOTE — Discharge Instructions (Signed)
Cataract discharge instructions given and reviewed. Lens implant card given to patient.

## 2017-10-01 NOTE — Anesthesia Preprocedure Evaluation (Addendum)
ANESTHESIA PRE-OP EVALUATION  Planned Procedure: Nuclear Sclerotic Cataract Right Eye (Right )  Review of Systems     anesthesia history negative   no family history of anesthetic complications   patient summary reviewed          Pulmonary     Former smoker  , COPD, asthma, shortness of breath (DOE, orthopnea) and sleep apnea (Sleeps sitting up instead of CPAP), no home oxygen   Cardiovascular    Hypertension, well controlled, dysrhythmias (A-fib) and   Following with [name] in [place] until recently (just moved back from [facility]).  Last seen around July 2018.  No changes.   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    Skin cancer s/p excision.              Physical Assessment      Patient summary reviewed   Airway       Mallampati: II    TM distance: >3 FB    Neck ROM: limited  Mouth Opening: good.  No Facial hair          Dental           (+) edentulous           Pulmonary    Comment:   Former smoker    Breath sounds clear to auscultation       Cardiovascular    Rhythm: regular    (-) no murmur     Other findings            Plan  Planned anesthesia type: MAC    ASA 4     Intravenous induction     Anesthetic plan and risks discussed with patient.         Use of blood products discussed with patient whom consented to blood products.     Patient's NPO status is appropriate for Anesthesia.                       Temperature: 35.9 C (96.6 F)  Heart Rate: 65  Respiratory Rate: 20  BP (Non-Invasive): (!) 148/55  SpO2-1: 100 %  Pain Score (Numeric, Faces): 0    BP  Readings from Last 5 Encounters:   10/01/17 (!) 148/55   04/19/15 140/80   11/10/14 127/67   10/19/14 (!) 101/52   10/05/14 (!) 157/77       BMI (Calculated): 55.35 (10/01/2017  6:56 AM)      CBC  Diff   Lab Results   Component Value Date/Time    WBC 5.3 11/10/2014 02:28 PM    HGB 10.4 (L) 11/10/2014 02:28 PM    HCT 32.8 (L) 11/10/2014 02:28 PM    PLTCNT 260 11/10/2014 02:28 PM    PLTCNT 390 04/11/2012 02:15 PM    RBC 3.93 11/10/2014 02:28 PM    MCV 83.5 11/10/2014 02:28  PM    MCHC 31.8 (L) 11/10/2014 02:28 PM    MCH 26.5 (L) 11/10/2014 02:28 PM    RDW 25.4 (H) 11/10/2014 02:28 PM    MPV 8.2 11/10/2014 02:28 PM    Lab Results   Component Value Date/Time    PMNS 48.0 11/10/2014 02:28 PM    LYMPHOCYTES 35.7 11/10/2014 02:28 PM    EOSINOPHIL 2.5 11/10/2014 02:28 PM    MONOCYTES 12.5 11/10/2014 02:28 PM    BASOPHILS 1.3 11/10/2014 02:28 PM    PMNABS 2.5 11/10/2014 02:28 PM    PMNABS 9.092 (H) 04/11/2012 02:15 PM    LYMPHSABS 1.9 11/10/2014 02:28 PM    EOSABS 0.1 11/10/2014 02:28 PM    MONOSABS 0.7 11/10/2014 02:28 PM    BASOSABS 0.10 11/10/2014 02:28 PM            Basic Metabolic Profile    Lab Results   Component Value Date/Time    SODIUM 139 11/10/2014 02:28 PM    POTASSIUM 3.7 11/10/2014 02:28 PM    CHLORIDE 106 11/10/2014 02:28 PM    CO2 24 11/10/2014 02:28 PM    ANIONGAP 9 04/11/2012 02:15 PM    Lab Results   Component Value Date/Time    BUN 2 (L) 11/10/2014 02:28 PM    CREATININE 0.8 11/10/2014 02:28 PM    GLUCOSENF 110 11/10/2014 02:28 PM        No results found for: HA1C    No results found for: TSH       Hepatic Function    Lab Results   Component Value Date/Time    ALBUMIN 2.8 (L) 11/10/2014 02:28 PM    TOTALPROTEIN 5.2 (L) 11/10/2014 02:28 PM    ALKPHOS 82 11/10/2014 02:28 PM    Lab Results   Component Value Date/Time    AST 24 11/10/2014 02:28 PM    ALT 12 11/10/2014 02:28 PM

## 2017-10-01 NOTE — OR Surgeon (Signed)
OPHTHALMOLOGY OPERATION SUMMARY     PATIENT NAME: Kelsey [name] NUMBER: [id number]   DATE OF SERVICE: 10/01/2017   DATE OF BIRTH: [birth year]    PREOPERATIVE DIAGNOSIS: Cataract, right eye.   POSTOPERATIVE DIAGNOSIS: Cataract, right eye.     NAME OF PROCEDURE: Phacoemulsification of cataract with posterior chamber lens implant, right eye.     SURGEONS: Rayvon Char. Joanell Rising, M.D.     ANESTHESIA: MAC, topical.   ESTIMATED BLOOD LOSS: Less than 1 drop.   COMPLICATIONS: There were no complications.   SPECIMENS: None.     DESCRIPTION OF PROCEDURE: The patient was brought to the pre-operative holding area at the [facility] where the vital signs were noted to be within normal limits.  The right eye was pharmacologically dilated.  2% Lidocaine gel was placed over the anterior cornea in the right eye.  The patient was taken to OR #5 at the [facility].  A surgical Time-Out confirmed the correct patient, procedure, and operative eye.  The patient had their vital signs monitored.  Anesthesia staff was utilized for topical with Monitored Anesthesia Care.  The face was prepped and draped in the usual sterile fashion.  A lid speculum was placed under the upper and lower eyelids of the right eye.  A 2.75 mm disposable metal keratome was used to make a clear corneal incision at the 10 O'clock position in the right eye.  A 1.47m disposable metal keratome was used to make a clear corneal incision at the 2 O'clock position in the right eye.  0.2 cubic centimeters of 1% preservative-free lidocaine were then injected into the anterior chamber.  After approximately 5 seconds, Viscoat was injected into the anterior chamber.  A bent needle was used, in combination with Utrata forceps to form an anterior capsulotomy.  Hydro-dissection and hydro-delineation were performed with balanced salt solution on a cannula. The phacoemulsification handpiece was introduced into the eye, and the lens nucleus was emulsified using  a bimanual nuclear splitting technique.  The phaco unit was removed and the tip of the I/A handpiece introduced into the eye. The remaining lens cortical material was aspirated from the eye.  The posterior capsule was polished with the I/A unit.  The I/A tip was removed, and the capsular bag was inflated with Provisc. The posterior chamber lens implant, [id number], of 23.5 diopters power was brought onto the field and inspected. It was without defect and was loaded into the lens injector. The tip of the injector was inserted into the eye, and the lens implant was placed in the capsular bag. The injector tip was removed and the tip of the I/A handpiece reintroduced into the eye. The remaining viscoelastic material was aspirated. The I/A tip was removed, and the anterior chamber was pressurized with balanced salt solution.  Weck-cel sponges were gently brushed against the incision and confirmed that there was no wound leakage.  The speculum was removed from the eye, Vigamox ophthalmic antibiotic drops were placed over the eye, and the eye was bandaged with a rigid shield. The patient was returned to same day surgery in excellent condition.     DCharlyne Mom MD2/12/201909:29

## 2017-10-01 NOTE — Anesthesia Postprocedure Evaluation (Signed)
Anesthesia Post Op Evaluation    Patient: Kelsey [name]  Procedure(s):  Nuclear Sclerotic Cataract Right Eye    Last Vitals:Temperature: 35.9 C (96.6 F) (10/01/17 0656)  Heart Rate: 65 (10/01/17 0924)  BP (Non-Invasive): (!) 144/53 (10/01/17 7124)  Respiratory Rate: 20 (10/01/17 0924)  SpO2-1: 98 % (10/01/17 0924)  Pain Score (Numeric, Faces): 0 (10/01/17 5809)  Patient is sufficiently recovered from the effects of anesthesia to participate in the evaluation and has returned to their pre-procedure level.  Patient location during evaluation: bedside   Post-procedure handoff checklist completed    Patient participation: complete - patient participated  Level of consciousness: awake and alert and responsive to verbal stimuli  Pain score: 0  Pain management: adequate  Airway patency: patent  Anesthetic complications: no  Cardiovascular status: acceptable  Respiratory status: acceptable  Hydration status: acceptable  Patient post-procedure temperature: Pt Normothermic   PONV Status: Absent

## 2017-10-01 NOTE — Interval H&P Note (Signed)
[facility]      H&P Cement, 67 y.o. female  Date of Admission:  10/01/2017  Date of Birth:  26-Mar-1951    10/01/2017    STOP: IF H&P IS GREATER THAN 30 DAYS FROM SURGICAL DAY COMPLETE NEW H&P IS REQUIRED.     H & P updated the day of the procedure.  1.  H&P completed within 30 days of surgical procedure  and has been reviewed within 24 hours of the surgery, the patient has been examined, and no change has occured in the patients condition since the H&P was completed.       Change in medications: No     No LMP recorded. Patient has had a hysterectomy.      Comments:     2.  Patient continues to be appropriate candidate for planned surgical procedure. YES    Charlyne Mom, MD

## 2017-10-02 DIAGNOSIS — H25811 Combined forms of age-related cataract, right eye: Secondary | ICD-10-CM | POA: Diagnosis not present

## 2017-10-11 ENCOUNTER — Other Ambulatory Visit (HOSPITAL_BASED_OUTPATIENT_CLINIC_OR_DEPARTMENT_OTHER): Payer: Self-pay | Admitting: Surgery

## 2017-10-22 ENCOUNTER — Other Ambulatory Visit: Payer: Self-pay | Admitting: Internal Medicine

## 2017-10-22 ENCOUNTER — Telehealth: Payer: Self-pay | Admitting: *Deleted

## 2017-10-22 NOTE — Telephone Encounter (Signed)
Would you pls reach out to her bc she has now had plenty of time to find a PCP in [place].

## 2017-10-22 NOTE — Telephone Encounter (Signed)
Leanne from [facility] in [place] left a msg on the refill vm requesting to change patients rx's to a ninety day supply. Please call [phone]. Thanks, MI

## 2017-10-23 MED ORDER — CARVEDILOL 6.25 MG PO TABS: ORAL_TABLET | ORAL | 0 refills | Status: AC

## 2017-10-23 NOTE — Addendum Note (Signed)
Addended by: Waylan Rocher on: 10/23/2017 10:54 AM   Modules accepted: Orders

## 2017-10-28 ENCOUNTER — Encounter (HOSPITAL_BASED_OUTPATIENT_CLINIC_OR_DEPARTMENT_OTHER): Payer: Self-pay | Admitting: Physician Assistant

## 2017-10-28 NOTE — Progress Notes (Signed)
Orthopaedics & Sports Medicine        History and Physical    Patient Name: Kelsey [name]  DOB: 01-May-1951  Medical Record Number: [id number]  Date of Service: 09/28/2017    Chief Complaint:   Chief Complaint   Patient presents with   . Knee Pain     bilateral knee       SUBJECTIVE: Kelsey [name] is a 67 y.o. female who presents today with complaints of A bilateral knee pain.  The patient fell 10 years ago.  She has been having symptoms ever since.  She complains of constant sharp pain in both knees.  Her pain is worse with walking and standing.  Better with nothing.  She has tried topical creams and Tylenol with minimal relief.  She has never attended physical therapy    Past Medical History:   Diagnosis Date   . Arthropathy, unspecified, site unspecified    . Asthma    . Cancer (CMS HCC)     cervical   . Colon cancer (CMS [place]) 07/06/2014   . COPD (chronic obstructive pulmonary disease) (CMS HCC)    . Fistula    . HTN (hypertension)    . Hyperlipidemia    . Obesity    . Sleep apnea    . Ventral hernia            Past Surgical History:   Procedure Laterality Date   . HX CERVICAL CONE BIOPSY      . HX CESAREAN SECTION     . HX GASTRIC BYPASS      25 years ago   . [place] (x2)   . HX HYSTERECTOMY      late 1990s   . HX LAP BANDING      2013   . HX LAPAROTOMY      2013 to remove lap band           Social History     Socioeconomic History   . Marital status: Married     Spouse name: Not on file   . Number of children: Not on file   . Years of education: Not on file   . Highest education level: Not on file   Social Needs   . Financial resource strain: Not on file   . Food insecurity - worry: Not on file   . Food insecurity - inability: Not on file   . Transportation needs - medical: Not on file   . Transportation needs - non-medical: Not on file   Occupational History   . Not on file   Tobacco Use   . Smoking status: Former Research scientist (life sciences)   . Smokeless tobacco: Never Used   . Tobacco comment: quit 4  yrs ago   Substance and Sexual Activity   . Alcohol use: Yes     Comment: rarely   . Drug use: No   . Sexual activity: Not on file   Other Topics Concern   . Not on file   Social History Narrative   . Not on file       Family Medical History:     Problem Relation (Age of Onset)    Diabetes Mother, Maternal Grandmother, Maternal Grandfather, Father    Heart Attack Mother    Stroke Father              ROS:    complains of bilateral knee pain, denies chest pain shortness of breath  All other systems reviewed and found to be negative     Medications:  Current Outpatient Medications   Medication Sig   . atorvastatin (LIPITOR) 10 mg Oral Tablet Take 10 mg by mouth Once a day   . budesonide-formoterol (SYMBICORT) 160-4.5 mcg/actuation Inhalation HFA Aerosol Inhaler Take 2 Puffs by inhalation Twice daily   . CARVEDILOL ORAL Take by mouth   . docusate sodium (COLACE) 100 mg Oral Capsule Take 100 mg by mouth Three times a day   . magnesium oxide 200 mg magnesium Oral Tablet Take 400 mg by mouth Once a day   . omeprazole (PRILOSEC) 20 mg Oral Capsule, Delayed Release(E.C.) Take 20 mg by mouth Once a day   . potassium chloride (K-DUR) 20 mEq Oral Tab Sust.Rel. Particle/Crystal Take 20 mEq by mouth Once a day   . prochlorperazine (COMPAZINE) 10 mg Oral Tablet Take 1 Tab (10 mg total) by mouth Four times a day as needed for nausea/vomiting   . traZODone (DESYREL) 100 mg Oral Tablet Take 100 mg by mouth Every night   . VITAMIN B COMPLEX (SUPER B COMPLEX-B-12 ORAL) Take by mouth   . vitamin E (AQUASOL E) 400 unit Oral Capsule Take 400 Units by mouth Once a day       Allergies:  Allergies   Allergen Reactions   . Shellfish Containing Products Shortness of Breath, Rash and Itching     scallops   . Vancomycin Shortness of Breath   . Iodine And Iodide Containing Products      Itching, shortness of breath        Objective:   Ht 1.6 m (5\' 3" )   Wt (!) 137 kg (302 lb)   BMI 53.50 kg/m       General:  Alert and oriented x 3, NAD  Head:  Normocephalic and atraumatic.   Eyes: Conjunctivae are normal. Pupils are equal, round.   Neck: Normal range of motion.   Cardiovascular: Normal rate.    Pulmonary/Chest: Effort normal.   Abdominal: Non-protuberant   Skin: Skin is warm and dry.   Psychiatric: normal mood and affect. behavior is normal. Judgment and thought content normal.     Ortho exam:   Skin is intact  Range of motion  5-125 degrees bilaterally  Varus and Valgus stress at 0 and 30 degrees causes   No laxity  Mcmurray testing  negative  Lachman testing  negative  Anterior and Posterior drawer testing  negative  Quad tone is poor  Calf is soft and supple    Xray reviewed and shows  Degenerative change bilateral knees     Assessment:  1. Bilateral knee pain    2. Primary osteoarthritis of both knees    3. Leg weakness, bilateral        Plan:    We had a long discussion concerning the treatment for osteoarthritis of the knees.  We discussed injections versus physical therapy verses ice and anti-inflammatory medication.  She has opted to try physical therapy.  An order was placed.  I plan to see her back in 8 weeks for recheck sooner with any problems.  The patient states understanding and is in agreement with this plan.  Orders Placed This Encounter   . Refer to External Physical Therapy       The patient was advised to call with any questions, concerns or worsening symptoms.    This note was partially generated using MModal Fluency Direct system, and there may be some incorrect  words, spellings, and punctuation that were not noted in checking the note before saving.    Jearld Adjutant, PA-C      Note was read.  Assessment and plan is correct.  Any changes were noted   JJF

## 2017-10-29 ENCOUNTER — Other Ambulatory Visit (HOSPITAL_BASED_OUTPATIENT_CLINIC_OR_DEPARTMENT_OTHER): Payer: Self-pay | Admitting: Surgery

## 2017-10-29 NOTE — H&P (Signed)
PATIENT NAME: Sumner NUMBER:  [id number]  DATE OF SERVICE:   DATE OF BIRTH:  1950-12-09    HISTORY AND PHYSICAL    CHIEF COMPLAINT:  This 67 year old white female presented to my office with complaints of a decrease in vision in both eyes.    PAST MEDICAL HISTORY:  Illnesses:   1. Hypertension.    2. Hyperlipidemia.   3. Gastroesophageal reflux disease.    MEDICATIONS:  1. Lipitor 10 mg daily.    2. Symbicort 160-4.5 mcg per actuation 2 puffs b.i.d.   3. Cardizem CD 120 mg daily.    4. Colace 100 mg t.i.d.    5. Hydrochlorothiazide 25 mg daily.    6. Metoprolol 25 mg b.i.d.    7. Prilosec 20 mg daily.    8. Potassium chloride 20 mEq daily.    9. Compazine 10 mg q.i.d.   10. Desyrel 100 mg daily.    11. Vitamin B complex unknown frequency.    12. Vitamin E 400 units daily.    ALLERGIES:  1. SHELLFISH PRODUCTS.    2. IODINE.    REVIEW OF SYSTEMS:  1. Colon cancer by history.   2. Arthritis.    SOCIAL HISTORY:  The patient lives with a family member and denies use of alcohol and/or tobacco.    PHYSICAL EXAMINATION:  My most recent eye exam showed a visual acuity, without glasses, of 20/60 on the right, and 20/50 on the left     PRESENT GLASSES:   None.     MANIFEST REFRACTION:   Right eye: -0.25 sphere for a best corrected vision of 20/60.   Left eye: -0.25 sphere for a best corrected vision of 20/50.     INTRAOCULAR PRESSURE:  Soft to digital palpation in each eye.     EXTERNAL:  Normal in both eyes.     VISUAL FIELDS:  Full to finger count in each eye.      PUPILS:  Equal, round, and reactive with no afferent pupillary defect.     MOTILITY:  Orthophoric (straight) with a full range of motion.     ANTERIOR SEGMENT EXAMINATION:  Normal in both eyes with a posterior chamber intraocular lens implant in a good position in the right eye and a grade 5/10 nuclear sclerotic cataract in the left eye.     FUNDUS:  Dilated fundus exam clear vitreous with a healthy-appearing optic nerve head, cup-to-disc ratio  of 0.1 in each eye with normal vessels, macula, and periphery in both eyes.    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    PLAN:  Proceed with cataract surgery in the left eye at the [facility], on October 29, 2017, with a 23.0 diopter SN60WF posterior chamber intraocular lens implant in the left eye.    PER ASSESSMENT:  1. The patient's impairment of visual function is believed not to be correctable with a tolerable change in glasses or contact lenses.  2. As such, the patient desires surgical correction.  3. The risks, benefits and alternatives have been explained to the patient in detail, and reasonable expectation exists that lens surgery will significantly improve both the visual and functional status of the patient.  4. If other ocular diseases are present, the cataract is the primary reason for this surgery.        Charlyne Mom, MD                DD:  10/28/2017 22:48:57  DT:  10/29/2017 06:04:24 RO  D#:  [id number]

## 2017-10-30 ENCOUNTER — Telehealth (HOSPITAL_BASED_OUTPATIENT_CLINIC_OR_DEPARTMENT_OTHER): Payer: Self-pay | Admitting: Surgery

## 2017-10-30 NOTE — Telephone Encounter (Signed)
Called pt 10/30/17 for no show 10/29/17, no answer. left message for pt to call and r/s missed appt, mailed no show letter pt and referring doctor.

## 2017-10-31 ENCOUNTER — Encounter (HOSPITAL_BASED_OUTPATIENT_CLINIC_OR_DEPARTMENT_OTHER): Payer: Self-pay

## 2017-10-31 ENCOUNTER — Ambulatory Visit (HOSPITAL_BASED_OUTPATIENT_CLINIC_OR_DEPARTMENT_OTHER): Payer: Self-pay | Admitting: Internal Medicine

## 2017-11-01 ENCOUNTER — Other Ambulatory Visit: Payer: Self-pay | Admitting: Internal Medicine

## 2017-11-01 NOTE — Telephone Encounter (Signed)
Attempted to reach patient to learn if she has found new PCP in [place]. No answer after 12 rings and no VM set up. Hubbard Hartshorn, RN, BSN

## 2017-11-04 NOTE — Telephone Encounter (Signed)
Called pharmacy. She has local MD and will forward refills there.

## 2017-11-27 DIAGNOSIS — L600 Ingrowing nail: Secondary | ICD-10-CM | POA: Diagnosis not present

## 2017-11-27 DIAGNOSIS — L03039 Cellulitis of unspecified toe: Secondary | ICD-10-CM | POA: Diagnosis not present

## 2017-12-03 ENCOUNTER — Encounter (HOSPITAL_COMMUNITY): Payer: Self-pay

## 2017-12-03 DIAGNOSIS — L089 Local infection of the skin and subcutaneous tissue, unspecified: Secondary | ICD-10-CM | POA: Diagnosis not present

## 2017-12-09 NOTE — Anesthesia Preprocedure Evaluation (Signed)
ANESTHESIA PRE-OP EVALUATION  Planned Procedure: Nuclear Sclerotic Cataractg Left Eye (Left )  Review of Systems     anesthesia history negative   no family history of anesthetic complications   patient summary reviewed          Pulmonary     Former smoker  , COPD, asthma, shortness of breath (DOE, orthopnea) and sleep apnea (Sleeps sitting up instead of CPAP), no home oxygen   Cardiovascular    Hypertension, well controlled, dysrhythmias (A-fib) and   Following with [name] in [place] until recently (just moved back from [facility]).  Last seen around July 2018.  No changes.   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    Skin cancer s/p excision.                  Physical Assessment      Patient summary reviewed   Airway       Mallampati: II    TM distance: >3 FB    Neck ROM: limited  Mouth Opening: good.  No Facial hair          Dental           (+) edentulous           Pulmonary    Comment:   Former smoker    Breath sounds clear to auscultation       Cardiovascular    Rhythm: regular    (-) no murmur     Other findings            Plan  Planned anesthesia type: MAC      Intravenous induction     Anesthetic plan and risks discussed with patient.         Use of blood products discussed with patient whom consented to blood products.     Patient's NPO status is appropriate for Anesthesia.                            BP Readings from Last 5 Encounters:   10/01/17 (!) 146/53   04/19/15 140/80   11/10/14 127/67   10/19/14 (!) 101/52   10/05/14 (!) 157/77       No  data recorded    CBC  Diff   Lab Results   Component Value Date/Time    WBC 5.3 11/10/2014 02:28 PM    HGB 10.4 (L) 11/10/2014 02:28 PM    HCT 32.8 (L) 11/10/2014 02:28 PM    PLTCNT 260 11/10/2014 02:28 PM    PLTCNT 390 04/11/2012 02:15 PM    RBC 3.93 11/10/2014 02:28 PM    MCV 83.5 11/10/2014 02:28 PM    MCHC 31.8 (L) 11/10/2014 02:28 PM    MCH 26.5 (L) 11/10/2014 02:28 PM    RDW 25.4 (H) 11/10/2014 02:28 PM  MPV 8.2 11/10/2014 02:28 PM    Lab Results   Component Value Date/Time    PMNS 48.0 11/10/2014 02:28 PM    LYMPHOCYTES 35.7 11/10/2014 02:28 PM    EOSINOPHIL 2.5 11/10/2014 02:28 PM    MONOCYTES 12.5 11/10/2014 02:28 PM    BASOPHILS 1.3 11/10/2014 02:28 PM    PMNABS 2.5 11/10/2014 02:28 PM    PMNABS 9.092 (H) 04/11/2012 02:15 PM    LYMPHSABS 1.9 11/10/2014 02:28 PM    EOSABS 0.1 11/10/2014 02:28 PM    MONOSABS 0.7 11/10/2014 02:28 PM    BASOSABS 0.10 11/10/2014 02:28 PM            Basic Metabolic Profile    Lab Results   Component Value Date/Time    SODIUM 139 11/10/2014 02:28 PM    POTASSIUM 3.7 11/10/2014 02:28 PM    CHLORIDE 106 11/10/2014 02:28 PM    CO2 24 11/10/2014 02:28 PM    ANIONGAP 9 04/11/2012 02:15 PM    Lab Results   Component Value Date/Time    BUN 2 (L) 11/10/2014 02:28 PM    CREATININE 0.8 11/10/2014 02:28 PM    GLUCOSENF 110 11/10/2014 02:28 PM        No results found for: HA1C    No results found for: TSH       Hepatic Function    Lab Results   Component Value Date/Time    ALBUMIN 2.8 (L) 11/10/2014 02:28 PM    TOTALPROTEIN 5.2 (L) 11/10/2014 02:28 PM    ALKPHOS 82 11/10/2014 02:28 PM    Lab Results   Component Value Date/Time    AST 24 11/10/2014 02:28 PM    ALT 12 11/10/2014 02:28 PM

## 2017-12-09 NOTE — Anesthesia Preprocedure Evaluation (Deleted)
ANESTHESIA PRE-OP EVALUATION  Planned Procedure: Nuclear Sclerotic Cataractg Left Eye (Left )  Review of Systems         patient summary reviewed          Pulmonary       Former smoker    , COPD, asthma and sleep apnea,   Cardiovascular    Hypertension and well controlled   ,beta blocker therapy      GI/Hepatic/Renal      s/p gastric bypass       Endo/Other    osteoarthritis and morbid obesity,       Neuro/Psych/MS        Cancer  CA (Cervical and colon),                    Physical Assessment      Patient summary reviewed   Airway                       Dental                    Pulmonary    Comment:     Former smoker             Cardiovascular             Other findings            Plan  Planned anesthesia type: MAC      Intravenous induction                                            BP Readings from Last 5 Encounters:   10/01/17 (!) 146/53   04/19/15 140/80   11/10/14 127/67   10/19/14 (!) 101/52   10/05/14 (!) 157/77       No data recorded    CBC  Diff   Lab Results   Component Value Date/Time    WBC 5.3 11/10/2014 02:28 PM    HGB 10.4 (L) 11/10/2014 02:28 PM    HCT 32.8 (L) 11/10/2014 02:28 PM    PLTCNT 260 11/10/2014 02:28 PM    PLTCNT 390 04/11/2012 02:15 PM    RBC 3.93 11/10/2014 02:28 PM    MCV 83.5 11/10/2014 02:28 PM    MCHC 31.8 (L) 11/10/2014 02:28 PM    MCH 26.5 (L) 11/10/2014 02:28 PM    RDW 25.4 (H) 11/10/2014 02:28 PM    MPV 8.2 11/10/2014 02:28 PM    Lab Results   Component Value Date/Time    PMNS 48.0 11/10/2014 02:28 PM    LYMPHOCYTES 35.7 11/10/2014 02:28 PM    EOSINOPHIL 2.5 11/10/2014 02:28 PM    MONOCYTES 12.5 11/10/2014 02:28 PM    BASOPHILS 1.3 11/10/2014 02:28 PM    PMNABS 2.5 11/10/2014 02:28 PM    PMNABS 9.092 (H) 04/11/2012 02:15 PM    LYMPHSABS 1.9 11/10/2014 02:28 PM    EOSABS 0.1 11/10/2014 02:28 PM    MONOSABS 0.7 11/10/2014 02:28 PM    BASOSABS 0.10 11/10/2014 02:28 PM            Basic Metabolic Profile    Lab Results   Component Value Date/Time    SODIUM 139 11/10/2014 02:28 PM     POTASSIUM 3.7 11/10/2014 02:28 PM    CHLORIDE 106 11/10/2014 02:28 PM    CO2 24 11/10/2014 02:28 PM  ANIONGAP 9 04/11/2012 02:15 PM    Lab Results   Component Value Date/Time    BUN 2 (L) 11/10/2014 02:28 PM    CREATININE 0.8 11/10/2014 02:28 PM    GLUCOSENF 110 11/10/2014 02:28 PM        No results found for: HA1C    No results found for: TSH       Hepatic Function    Lab Results   Component Value Date/Time    ALBUMIN 2.8 (L) 11/10/2014 02:28 PM    TOTALPROTEIN 5.2 (L) 11/10/2014 02:28 PM    ALKPHOS 82 11/10/2014 02:28 PM    Lab Results   Component Value Date/Time    AST 24 11/10/2014 02:28 PM    ALT 12 11/10/2014 02:28 PM

## 2017-12-09 NOTE — H&P (Signed)
PATIENT NAME: Kelsey [name] NUMBER:  [id number]  DATE OF SERVICE: 12/10/2017  DATE OF BIRTH:  June 09, 1951    HISTORY AND PHYSICAL    CHIEF COMPLAINT:  This 67 year old white female presented to my office with complaints of a decrease in vision in both eyes.  The patient underwent an uncomplicated cataract surgery in her right eye on October 01, 2017, and had good recovery of vision in this right eye.  Because of the decreased vision in the left eye secondary to the cataract, the patient elected to undergo cataract surgery in this eye also.    PAST MEDICAL HISTORY:  ILLNESSES:   1. Hypertension.   2. Hyperlipidemia.   3. Gastroesophageal reflux disease.    MEDICATIONS:  1. Lipitor 10 mg daily.  2. Symbicort 160/4.5 mcg per inhalation, 2 puffs b.i.d.   3. Carvedilol daily.   4. Colace 100 mg t.i.d.   5. Magnesium oxide 200 mg tablets, 400 mg daily.    6. Prilosec 20 mg daily.    7. Potassium chloride 20 mEq daily.    8. Compazine 10 mg q.i.d. p.r.n. nausea, vomiting.   9. Desyrel 100 mg daily.    10. Vitamin B complex daily.   11. Vitamin E (Aquasol E) 400 units daily.    ALLERGIES:  1. SHELLFISH CONTAINING PRODUCTS.   2. VANCOMYCIN.    REVIEW OF SYSTEMS:  1. Status post colon cancer.   2. Arthritis.    SOCIAL HISTORY:  The patient lives with a family member and denies use of alcohol and/or tobacco.    PHYSICAL EXAMINATION:  My most recent eye exam showed a visual acuity of 20/20 to 20/30 on the right, and 20/50 on the left     PRESENT GLASSES:   Right eye:  None.   Left eye:  None.     MANIFEST REFRACTION:   Right eye:  -0.25 sphere, for a best corrected vision of 20/25.   Left eye:  -0.25 sphere, for a best corrected vision of 20/50.     INTRAOCULAR PRESSURE:  18 right eye, 22 left eye.     EXTERNAL:  Normal in both eyes.     VISUAL FIELDS:  Full to finger count in each eye.      Pupils equal, round, and reactive, with no afferent pupillary defect.      MOTILITY:  Orthophoric (straight) with a full  range of motion.     ANTERIOR SEGMENT EXAMINATION:  Normal in both eyes, with a posterior chamber intraocular lens implant in a good position in the right eye and a grade 5/10 nuclear sclerotic cataract in the left eye.     FUNDUS:  Dilated fundus exam:  Clear vitreous with a healthy-appearing optic nerve head, cup-to-disc ratio of 0.2 in each eye, with a posterior vitreal detachment in both eyes, and normal vessels, macula, and periphery in each eye.    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    PLAN:  Proceed with cataract surgery in the left eye at the [facility] on December 10, 2017, with a 23.0 diopter [id number] posterior chamber intraocular lens implant in the left eye.    PER ASSESSMENT:  1. The patient's impairment of visual function is believed not to be correctable with a tolerable change in glasses or contact lenses.  2. As such, the patient desires surgical correction.  3. The risks, benefits and alternatives have been explained to the patient in detail, and reasonable  expectation exists that lens surgery will significantly improve both the visual and functional status of the patient.  4. If other ocular diseases are present, the cataract is the primary reason for this surgery.        Charlyne Mom, MD              DD:  12/09/2017 20:50:38  DT:  12/09/2017 21:15:14 LB  D#:  [id number]

## 2017-12-10 ENCOUNTER — Inpatient Hospital Stay (HOSPITAL_COMMUNITY): Admission: RE | Admit: 2017-12-10 | Payer: Medicare Other | Source: Ambulatory Visit | Admitting: Ophthalmology

## 2017-12-10 ENCOUNTER — Encounter (HOSPITAL_COMMUNITY): Payer: Self-pay | Admitting: Anesthesiology

## 2017-12-10 HISTORY — DX: Unspecified cataract: H26.9

## 2017-12-10 SURGERY — PHACO WITH INTRAOCULAR LENS
Anesthesia: Monitor Anesthesia Care | Laterality: Left

## 2017-12-10 MED ORDER — CYCLOPENTOLATE 1 % EYE DROPS
1.0000 [drp] | OPHTHALMIC | Status: DC
Start: 2017-12-10 — End: 2018-06-09
  Filled 2017-12-10 (×2): qty 2

## 2017-12-10 MED ORDER — PROPARACAINE 0.5 % EYE DROPS
1.0000 [drp] | Freq: Once | OPHTHALMIC | Status: DC
Start: 2017-12-10 — End: 2018-06-09
  Filled 2017-12-10 (×2): qty 15

## 2017-12-10 MED ORDER — LIDOCAINE HCL 2 % MUCOSAL JELLY
0.5000 mL | Status: DC
Start: 2017-12-10 — End: 2018-06-09
  Filled 2017-12-10 (×2): qty 5

## 2017-12-10 MED ORDER — PHENYLEPHRINE 2.5 % EYE DROPS
1.0000 [drp] | OPHTHALMIC | Status: DC
Start: 2017-12-10 — End: 2018-06-09
  Filled 2017-12-10 (×2): qty 15

## 2017-12-12 ENCOUNTER — Telehealth: Payer: Self-pay

## 2017-12-12 NOTE — Telephone Encounter (Signed)
Pt did not pick up. Voicemail was not set-up so could not leave message.

## 2018-03-21 DIAGNOSIS — K429 Umbilical hernia without obstruction or gangrene: Secondary | ICD-10-CM | POA: Diagnosis not present

## 2018-03-21 DIAGNOSIS — R918 Other nonspecific abnormal finding of lung field: Secondary | ICD-10-CM | POA: Diagnosis not present

## 2018-03-21 DIAGNOSIS — Z85038 Personal history of other malignant neoplasm of large intestine: Secondary | ICD-10-CM | POA: Diagnosis not present

## 2018-03-21 DIAGNOSIS — K56699 Other intestinal obstruction unspecified as to partial versus complete obstruction: Secondary | ICD-10-CM | POA: Diagnosis not present

## 2018-03-21 DIAGNOSIS — R109 Unspecified abdominal pain: Secondary | ICD-10-CM | POA: Diagnosis not present

## 2018-03-21 DIAGNOSIS — K6389 Other specified diseases of intestine: Secondary | ICD-10-CM | POA: Diagnosis not present

## 2018-03-21 DIAGNOSIS — Z9884 Bariatric surgery status: Secondary | ICD-10-CM | POA: Diagnosis not present

## 2018-03-22 ENCOUNTER — Encounter (HOSPITAL_COMMUNITY): Payer: Self-pay

## 2018-03-22 ENCOUNTER — Inpatient Hospital Stay
Admission: AD | Admit: 2018-03-22 | Discharge: 2018-03-27 | DRG: 389 | Disposition: A | Discharge status: Home / Self Care | Payer: Medicare Other | Source: Other Acute Inpatient Hospital | Attending: Hospitalist | Admitting: Hospitalist

## 2018-03-22 ENCOUNTER — Inpatient Hospital Stay (HOSPITAL_COMMUNITY): Payer: Medicare Other | Admitting: Internal Medicine

## 2018-03-22 DIAGNOSIS — J449 Chronic obstructive pulmonary disease, unspecified: Secondary | ICD-10-CM | POA: Diagnosis present

## 2018-03-22 DIAGNOSIS — E785 Hyperlipidemia, unspecified: Secondary | ICD-10-CM | POA: Diagnosis present

## 2018-03-22 DIAGNOSIS — Z0389 Encounter for observation for other suspected diseases and conditions ruled out: Secondary | ICD-10-CM | POA: Diagnosis not present

## 2018-03-22 DIAGNOSIS — Z7901 Long term (current) use of anticoagulants: Secondary | ICD-10-CM | POA: Diagnosis not present

## 2018-03-22 DIAGNOSIS — K429 Umbilical hernia without obstruction or gangrene: Secondary | ICD-10-CM | POA: Diagnosis not present

## 2018-03-22 DIAGNOSIS — Z6841 Body Mass Index (BMI) 40.0 and over, adult: Secondary | ICD-10-CM | POA: Diagnosis not present

## 2018-03-22 DIAGNOSIS — I480 Paroxysmal atrial fibrillation: Secondary | ICD-10-CM | POA: Diagnosis present

## 2018-03-22 DIAGNOSIS — K56609 Unspecified intestinal obstruction, unspecified as to partial versus complete obstruction: Secondary | ICD-10-CM | POA: Diagnosis present

## 2018-03-22 DIAGNOSIS — E669 Obesity, unspecified: Secondary | ICD-10-CM | POA: Diagnosis present

## 2018-03-22 DIAGNOSIS — R102 Pelvic and perineal pain: Secondary | ICD-10-CM | POA: Diagnosis not present

## 2018-03-22 DIAGNOSIS — R109 Unspecified abdominal pain: Secondary | ICD-10-CM | POA: Diagnosis not present

## 2018-03-22 DIAGNOSIS — C7801 Secondary malignant neoplasm of right lung: Secondary | ICD-10-CM | POA: Diagnosis present

## 2018-03-22 DIAGNOSIS — Z9119 Patient's noncompliance with other medical treatment and regimen: Secondary | ICD-10-CM | POA: Diagnosis not present

## 2018-03-22 DIAGNOSIS — R1030 Lower abdominal pain, unspecified: Secondary | ICD-10-CM | POA: Diagnosis not present

## 2018-03-22 DIAGNOSIS — C187 Malignant neoplasm of sigmoid colon: Secondary | ICD-10-CM | POA: Diagnosis not present

## 2018-03-22 DIAGNOSIS — G4733 Obstructive sleep apnea (adult) (pediatric): Secondary | ICD-10-CM | POA: Diagnosis present

## 2018-03-22 DIAGNOSIS — Z87891 Personal history of nicotine dependence: Secondary | ICD-10-CM | POA: Diagnosis not present

## 2018-03-22 DIAGNOSIS — Z9884 Bariatric surgery status: Secondary | ICD-10-CM | POA: Diagnosis not present

## 2018-03-22 DIAGNOSIS — M129 Arthropathy, unspecified: Secondary | ICD-10-CM | POA: Diagnosis present

## 2018-03-22 DIAGNOSIS — Z85038 Personal history of other malignant neoplasm of large intestine: Secondary | ICD-10-CM | POA: Diagnosis not present

## 2018-03-22 DIAGNOSIS — Z7951 Long term (current) use of inhaled steroids: Secondary | ICD-10-CM | POA: Diagnosis not present

## 2018-03-22 DIAGNOSIS — R1084 Generalized abdominal pain: Secondary | ICD-10-CM | POA: Diagnosis not present

## 2018-03-22 DIAGNOSIS — R918 Other nonspecific abnormal finding of lung field: Secondary | ICD-10-CM | POA: Diagnosis not present

## 2018-03-22 DIAGNOSIS — I10 Essential (primary) hypertension: Secondary | ICD-10-CM | POA: Diagnosis present

## 2018-03-22 DIAGNOSIS — K5900 Constipation, unspecified: Secondary | ICD-10-CM | POA: Diagnosis not present

## 2018-03-22 DIAGNOSIS — R112 Nausea with vomiting, unspecified: Secondary | ICD-10-CM | POA: Diagnosis not present

## 2018-03-22 DIAGNOSIS — Z79899 Other long term (current) drug therapy: Secondary | ICD-10-CM

## 2018-03-22 HISTORY — DX: Unspecified atrial fibrillation: I48.91

## 2018-03-22 HISTORY — DX: Unspecified atrial fibrillation (CMS HCC): I48.91

## 2018-03-22 LAB — CBC WITH DIFF
BASOPHIL #: 0.1 10*3/uL (ref 0.00–0.20)
BASOPHIL %: 1 %
EOSINOPHIL #: 0.3 10*3/uL (ref 0.00–0.50)
EOSINOPHIL %: 3 %
HCT: 42.2 % (ref 34.6–46.2)
HGB: 12.9 g/dL (ref 11.8–15.8)
LYMPHOCYTE #: 1.8 10*3/uL (ref 0.90–3.40)
LYMPHOCYTE %: 20 %
MCH: 26.6 pg — ABNORMAL LOW (ref 27.6–33.2)
MCHC: 30.6 g/dL — ABNORMAL LOW (ref 32.6–35.4)
MCV: 86.8 fL (ref 82.3–96.7)
MONOCYTE #: 0.9 10*3/uL (ref 0.20–0.90)
MONOCYTE %: 10 %
MONOCYTE %: 10 %
MPV: 8.6 fL (ref 6.6–10.2)
NEUTROPHIL #: 6.1 10*3/uL (ref 1.50–6.40)
NEUTROPHIL %: 67 %
PLATELETS: 220 10*3/uL (ref 140–440)
RBC: 4.86 10*6/uL (ref 3.80–5.24)
RDW: 16.2 % — ABNORMAL HIGH (ref 12.4–15.2)
WBC: 9.1 10*3/uL (ref 3.5–10.3)

## 2018-03-22 LAB — LDH: LDH: 166 U/L (ref <=250)

## 2018-03-22 LAB — HEPATIC FUNCTION PANEL
ALBUMIN: 3.5 g/dL (ref 3.2–4.6)
ALBUMIN: 3.5 g/dL (ref 3.2–4.6)
ALKALINE PHOSPHATASE: 62 U/L (ref 20–130)
ALT (SGPT): 35 U/L (ref <=52)
AST (SGOT): 30 U/L (ref <=35)
AST (SGOT): 30 U/L (ref <=35)
BILIRUBIN DIRECT: 0.2 mg/dL (ref <=0.3)
BILIRUBIN DIRECT: 0.2 mg/dL (ref <=0.3)
BILIRUBIN TOTAL: 0.6 mg/dL (ref 0.3–1.2)
PROTEIN TOTAL: 5.5 g/dL — ABNORMAL LOW (ref 6.0–8.3)

## 2018-03-22 LAB — MAGNESIUM: MAGNESIUM: 2.8 mg/dL — ABNORMAL HIGH (ref 1.8–2.3)

## 2018-03-22 LAB — BASIC METABOLIC PANEL
ANION GAP: 8 mmol/L
BUN/CREA RATIO: 17
BUN: 15 mg/dL (ref 10–25)
CALCIUM: 7.4 mg/dL — ABNORMAL LOW (ref 8.8–10.3)
CHLORIDE: 106 mmol/L (ref 98–111)
CO2 TOTAL: 26 mmol/L (ref 21–35)
CREATININE: 0.87 mg/dL (ref <=1.30)
ESTIMATED GFR: >60 mL/min/{1.73_m2}
GLUCOSE: 122 mg/dL — ABNORMAL HIGH (ref 70–110)
POTASSIUM: 3.6 mmol/L (ref 3.5–5.0)
SODIUM: 140 mmol/L (ref 135–145)

## 2018-03-22 LAB — C-REACTIVE PROTEIN(CRP),INFLAMMATION: CRP INFLAMMATION: 4.8 mg/L (ref <=5.0)

## 2018-03-22 LAB — PT/INR: INR: 1.15 — ABNORMAL HIGH (ref 0.80–1.10)

## 2018-03-22 LAB — CARCINOEMBRYONIC ANTIGEN: CEA: 0.7 ng/mL (ref 0.0–5.0)

## 2018-03-22 LAB — PROCALCITONIN: PROCALCITONIN: 0.05 ng/mL (ref <0.50)

## 2018-03-22 LAB — PHOSPHORUS: PHOSPHORUS: 2.1 mg/dL — ABNORMAL LOW (ref 2.5–4.5)

## 2018-03-22 MED ORDER — SODIUM CHLORIDE 0.9 % (FLUSH) INJECTION SYRINGE
3.0000 mL | INJECTION | INTRAMUSCULAR | Status: DC | PRN
Start: 2018-03-22 — End: 2018-03-27

## 2018-03-22 MED ORDER — BUDESONIDE-FORMOTEROL HFA 160 MCG-4.5 MCG/ACTUATION - EAST
2.00 | INHALATION_SPRAY | Freq: Two times a day (BID) | RESPIRATORY_TRACT | Status: DC
Start: 2018-03-22 — End: 2018-03-27
  Administered 2018-03-22 – 2018-03-27 (×11): 2 via RESPIRATORY_TRACT
  Filled 2018-03-22: qty 6

## 2018-03-22 MED ORDER — ALBUTEROL SULFATE 2.5 MG/3 ML (0.083 %) SOLUTION FOR NEBULIZATION
2.50 mg | INHALATION_SOLUTION | RESPIRATORY_TRACT | Status: DC | PRN
Start: 2018-03-22 — End: 2018-03-27

## 2018-03-22 MED ORDER — SODIUM CHLORIDE 0.9 % INTRAVENOUS SOLUTION
30.0000 mmol | Freq: Once | INTRAVENOUS | Status: AC
Start: 2018-03-22 — End: 2018-03-22
  Administered 2018-03-22: 30 mmol via INTRAVENOUS
  Administered 2018-03-22: 0 mmol via INTRAVENOUS
  Filled 2018-03-22: qty 10

## 2018-03-22 MED ORDER — SODIUM CHLORIDE 0.9 % INTRAVENOUS SOLUTION
INTRAVENOUS | Status: DC
Start: 2018-03-22 — End: 2018-03-25
  Administered 2018-03-23 – 2018-03-25 (×3): 0 via INTRAVENOUS

## 2018-03-22 MED ORDER — ONDANSETRON HCL (PF) 4 MG/2 ML INJECTION SOLUTION
4.0000 mg | INTRAMUSCULAR | Status: DC | PRN
Start: 2018-03-22 — End: 2018-03-25
  Administered 2018-03-22 – 2018-03-25 (×7): 4 mg via INTRAVENOUS
  Filled 2018-03-22 (×10): qty 2

## 2018-03-22 MED ORDER — MORPHINE 4 MG/ML INTRAVENOUS SYRINGE
3.00 mg | INJECTION | INTRAVENOUS | Status: DC | PRN
Start: 2018-03-22 — End: 2018-03-23
  Administered 2018-03-22 – 2018-03-23 (×6): 3 mg via INTRAVENOUS
  Filled 2018-03-22 (×6): qty 1

## 2018-03-22 MED ORDER — PANTOPRAZOLE 40 MG INTRAVENOUS SOLUTION
40.0000 mg | Freq: Every day | INTRAVENOUS | Status: DC
Start: 2018-03-22 — End: 2018-03-26
  Administered 2018-03-22 – 2018-03-26 (×5): 40 mg via INTRAVENOUS
  Filled 2018-03-22 (×6): qty 10

## 2018-03-22 MED ORDER — SODIUM CHLORIDE 0.9 % (FLUSH) INJECTION SYRINGE
3.0000 mL | INJECTION | Freq: Three times a day (TID) | INTRAMUSCULAR | Status: DC
Start: 2018-03-22 — End: 2018-03-27
  Administered 2018-03-22 – 2018-03-24 (×8): 0 mL
  Administered 2018-03-24: 3 mL
  Administered 2018-03-24 – 2018-03-25 (×2): 0 mL
  Administered 2018-03-25: 3 mL
  Administered 2018-03-25: 0 mL
  Administered 2018-03-26: 3 mL
  Administered 2018-03-26 – 2018-03-27 (×3): 0 mL

## 2018-03-22 MED ADMIN — sodium chloride 0.9 % (flush) injection syringe: @ 06:00:00

## 2018-03-22 MED ADMIN — oxyCODONE-acetaminophen 5 mg-325 mg tablet: INTRAVENOUS | @ 09:00:00

## 2018-03-22 NOTE — Care Plan (Signed)
Problem: Adult Inpatient Plan of Care  Goal: Plan of Care Review  Outcome: Ongoing (see interventions/notes)  Goal: Patient-Specific Goal (Individualization)  Outcome: Ongoing (see interventions/notes)  Goal: Absence of Hospital-Acquired Illness or Injury  Outcome: Ongoing (see interventions/notes)  Goal: Optimal Comfort and Wellbeing  Outcome: Ongoing (see interventions/notes)  Goal: Rounds/Family Conference  Outcome: Ongoing (see interventions/notes)     Problem: Pain Acute  Goal: Optimal Pain Control  Outcome: Ongoing (see interventions/notes)     Problem: Constipation  Goal: Effective Bowel Elimination  Outcome: Ongoing (see interventions/notes)     Problem: Nausea and Vomiting (Intestinal Obstruction)  Goal: Nausea and Vomiting Relief  Outcome: Ongoing (see interventions/notes)     Problem: Pain (Intestinal Obstruction)  Goal: Acceptable Pain Control  Outcome: Ongoing (see interventions/notes)     Problem: Fall Injury Risk  Goal: Absence of Fall and Fall-Related Injury  Outcome: Ongoing (see interventions/notes)

## 2018-03-22 NOTE — Consults (Signed)
[facility] General Surgery  Consult Note    Kelsey [name]       67 y.o.       Date of service: 03/22/2018  Date of Admission:  03/22/2018    Hospital Day:  LOS: 0 days     HISTORY OF PRESENT ILLNESS:  Kelsey [name] is a 67 y.o. female who was admitted to [facility] with abdominal pain nausea and a possible bowel obstruction.  The patient was transferred from [facility].  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 Laurena Bering in 2015. She had chemotherapy that was stopped due to recurrent abdominal wall infections by report.  She apparently had moved to [place] and then recently relocated to [place].  She tells me that since her surgery she had constipation alternating with diarrhea.  She tells me she does not take Imodium but a pill similar to that.  She tells me that she has also had a gastric bypass in the past.    The patient reports no bowel movements over the past week.  She tells me she has tried taking Metamucil as well as stool softeners and milk of magnesia.  She developed severe pain in the lower abdomen after attempting a bowel prep that she self prescribed.  She is and severe nausea and tells me she has been taking medications for nausea with a past 4 days.      The patient reports coming to the clinic for further evaluation.  She was noted have a white blood count 8 point and in the lactic acid 1.9.  CT scan of the chest abdomen pelvis showed a new right lower lobe lung mass as well as changes from a gastric bypass and a dilated sigmoid colon proximal to anastomosis with possible distal narrowing.    Currently the patient denies nausea but does report abdominal pain and distention.  She still has not had a bowel movement.  She denies fevers or chills.  She tells me her pain is somewhat less than yesterday.  PAST MEDICAL HISTORY:  Past Medical History:   Diagnosis Date   . Arthropathy, unspecified, site  unspecified    . Asthma    . Cancer (CMS HCC)     cervical   . Cataract    . Colon cancer (CMS [place]) 07/06/2014   . COPD (chronic obstructive pulmonary disease) (CMS HCC)    . Fistula    . HTN (hypertension)    . Hyperlipidemia    . Obesity    . Sleep apnea    . Ventral hernia          PAST SURGICAL HISTORY:  Past Surgical History:   Procedure Laterality Date   . BOWEL RESECTION     . CATARACT EXTRACTION     . HX CERVICAL CONE BIOPSY      . HX CESAREAN SECTION     . HX GASTRIC BYPASS      25 years ago   . [place] (x2)   . HX HYSTERECTOMY      late 1990s   . HX LAP BANDING      2013   . HX LAPAROTOMY      2013 to remove lap band         FAMILY HISTORY:  Family Medical History:     Problem Relation (Age of Onset)    Diabetes Mother,  Maternal Grandmother, Maternal Grandfather, Father    Heart Attack Mother    Stroke Father            SOCIAL HISTORY:  Social History     Socioeconomic History   . Marital status: Married     Spouse name: Not on file   . Number of children: Not on file   . Years of education: Not on file   . Highest education level: Not on file   Occupational History   . Not on file   Social Needs   . Financial resource strain: Not on file   . Food insecurity:     Worry: Not on file     Inability: Not on file   . Transportation needs:     Medical: Not on file     Non-medical: Not on file   Tobacco Use   . Smoking status: Former Research scientist (life sciences)   . Smokeless tobacco: Never Used   . Tobacco comment: quit 4 yrs ago   Substance and Sexual Activity   . Alcohol use: Yes     Comment: rarely   . Drug use: No   . Sexual activity: Not on file   Lifestyle   . Physical activity:     Days per week: Not on file     Minutes per session: Not on file   . Stress: Not on file   Relationships   . Social connections:     Talks on phone: Not on file     Gets together: Not on file     Attends religious service: Not on file     Active member of club or organization: Not on file     Attends meetings of clubs or  organizations: Not on file     Relationship status: Not on file   . Intimate partner violence:     Fear of current or ex partner: Not on file     Emotionally abused: Not on file     Physically abused: Not on file     Forced sexual activity: Not on file   Other Topics Concern   . Ability to Walk 1 Flight of Steps without SOB/CP Not Asked   . Routine Exercise Not Asked   . Ability to Walk 2 Flight of Steps without SOB/CP Not Asked   . Unable to Ambulate Not Asked   . Total Care Not Asked   . Ability To Do Own ADL's Not Asked   . Uses Walker Not Asked   . Other Activity Level Not Asked   . Uses Cane Not Asked   Social History Narrative   . Not on file       ALLERGIES:  Allergies   Allergen Reactions   . Shellfish Containing Products Shortness of Breath, Rash and Itching     scallops   . Vancomycin Shortness of Breath   . Iodine And Iodide Containing Products      Itching, shortness of breath      PROBLEM LIST:  Patient Active Problem List   Diagnosis   . HTN (hypertension)   . Hyperlipidemia   . Obesity   . Arthropathy, unspecified, site unspecified   . COPD (chronic obstructive pulmonary disease) (CMS HCC)   . Ventral hernia   . Colon cancer (CMS HCC)   . Colonic obstruction (CMS HCC)   . Abdominal pain   . Right lower lobe lung mass       REVIEW OF SYSTEMS:   Constitutional:  denies unexplained weight loss, night sweats, fatigue/malaise/lethargy, sleeping Eyes visual changes, headache, eye pain, or  double vision   ENT: Denies runny nose, frequent nose bleeds, sinus pain, stuffy ears, ear pain, ringing in ears (tinnitus), gingival bleeding, toothache, sore throat, pain with swallowing  Cardiovascular: Denies chest pain, shortness of breath, or exercise intolerance  Respiratory: Denies cough, sputum, wheeze, haemoptysis, or shortness of breath   Gastrointestinal: Denies unintentional weight loss, difficulty swallowing, bloating, cramping, anorexia, food avoidance, diarrhea/constipation, or inability to pass gas      Genitourinary :  Denies urinary incontinence, dysuria, hematuria, nocturia, or polyuria  Musculoskeletal: Denies pain, stiffness , joint swelling   Integumentary: Denies rashes, lesions, wounds, or excessive dryness of the skin   Breast: Denies pain, soreness, lumps, or discharge.   Neurological: Denies any changes in sight, smell, hearing and taste, no evidence of limb weakness.   Psychiatric: Denies depression, change in sleep patterns or anxiety.    PHYSICAL EXAMINATION:  Filed Vitals:    03/22/18 0330 03/22/18 0743   BP: (!) 154/82 115/90   Pulse: 71 78   Resp: 18 18   Temp: 36.7 C (98.1 F) 36.7 C (98.1 F)   SpO2: 95% 94%     No intake or output data in the 24 hours ending 03/22/18 1001  I have reviewed the vitals.  General: Pleasant, awake, alert, no acute distress.   HEENT: No scleral icterus or conjunctival injection. Bilateral TM pearly grey without erythema or bulging. No posterior oropharynx erythema or exudate. No oral ulcer or thrush.   Lymph: No anterior or posterior head or neck lymphadenopathy.   CV: RRR without murmurs.   Respiratory: Lungs CTA bilateral, anterior and posterior lung fields without wheezing or crackles.   Abdomen: No palpable hepatosplenomegaly.  Morbidly obese.  Hernia just above the umbilicus noted.  Seems reducible.  Distended.  Mild tenderness in the epigastric region.  Extremities: Pink, intact  Neurological: CN II-XII grossly intact. AA&O x 3 without deficits. Speech clear. Upper and lower extremity strength and sensation symmetrical and intact.   Skin: Pink      Labs:     Results for orders placed or performed during the hospital encounter of 03/22/18 (from the past 24 hour(s))   BASIC METABOLIC PANEL   Result Value Ref Range    SODIUM 140 135 - 145 mmol/L    POTASSIUM 3.6 3.5 - 5.0 mmol/L    CHLORIDE 106 98 - 111 mmol/L    CO2 TOTAL 26 21 - 35 mmol/L    ANION GAP 8 mmol/L    CALCIUM 7.4 (L) 8.8 - 10.3 mg/dL    GLUCOSE 122 (H) 70 - 110 mg/dL    BUN 15 10 - 25 mg/dL     CREATININE 0.87 <=1.30 mg/dL    BUN/CREA RATIO 17     ESTIMATED GFR >60 Avg: 85 mL/min/1.65m^2   C-REACTIVE PROTEIN(CRP),INFLAMMATION   Result Value Ref Range    CRP INFLAMMATION 4.8 <=5.0 mg/L   CBC/DIFF    Narrative    The following orders were created for panel order CBC/DIFF.  Procedure                               Abnormality         Status                     ---------                               -----------         ------  CBC WITH NATF[[id number]]                Abnormal            Final result                 Please view results for these tests on the individual orders.   HEPATIC FUNCTION PANEL   Result Value Ref Range    ALBUMIN 3.5 3.2 - 4.6 g/dL    ALKALINE PHOSPHATASE 62 20 - 130 U/L    ALT (SGPT) 35 <=52 U/L    AST (SGOT) 30 <=35 U/L    BILIRUBIN TOTAL 0.6 0.3 - 1.2 mg/dL    BILIRUBIN DIRECT 0.2 <=0.3 mg/dL    PROTEIN TOTAL 5.5 (L) 6.0 - 8.3 g/dL   LDH   Result Value Ref Range    LDH 166 <=250 U/L   MAGNESIUM   Result Value Ref Range    MAGNESIUM 2.8 (H) 1.8 - 2.3 mg/dL   PHOSPHORUS   Result Value Ref Range    PHOSPHORUS 2.1 (L) 2.5 - 4.5 mg/dL   PROCALCITONIN, SERUM   Result Value Ref Range    PROCALCITONIN SERUM 0.05 <0.50 ng/mL   PT/INR   Result Value Ref Range    INR 1.15 (H) 0.80 - 1.10    Narrative    Coumadin Therapy INR range for Conventional Anticoagulation Therapy is 2.0 to 3.0 and for Intensive Anticoagulation Therapy 2.5 to 3.5   CBC WITH DIFF   Result Value Ref Range    WBC 9.1 3.5 - 10.3 x10^3/uL    RBC 4.86 3.80 - 5.24 x10^6/uL    HGB 12.9 11.8 - 15.8 g/dL    HCT 42.2 34.6 - 46.2 %    MCV 86.8 82.3 - 96.7 fL    MCH 26.6 (L) 27.6 - 33.2 pg    MCHC 30.6 (L) 32.6 - 35.4 g/dL    RDW 16.2 (H) 12.4 - 15.2 %    PLATELETS 220 140 - 440 x10^3/uL    MPV 8.6 6.6 - 10.2 fL    NEUTROPHIL % 67 %    LYMPHOCYTE % 20 %    MONOCYTE % 10 %    EOSINOPHIL % 3 %    BASOPHIL % 1 %    NEUTROPHIL # 6.10 1.50 - 6.40 x10^3/uL    LYMPHOCYTE # 1.80 0.90 - 3.40 x10^3/uL    MONOCYTE # 0.90 0.20 -  0.90 x10^3/uL    EOSINOPHIL # 0.30 0.00 - 0.50 x10^3/uL    BASOPHIL # 0.10 0.00 - 0.20 x10^3/uL       I have reviewed all labs.      Current Facility-Administered Medications:   .  albuterol (PROVENTIL) 2.5 mg / 3 mL (0.083%) neb solution, 2.5 mg, Nebulization, Q4H PRN, Shelbie Hutching, NP  .  budesonide-formoterol (SYMBICORT) 160 mcg-4.5 mcg per inhalation oral inhaler, 2 Puff, Inhalation, 2x/day, Shelbie Hutching, NP  .  morphine 4 mg/mL injection, 3 mg, Intravenous, Q3H PRN, Shelbie Hutching, NP, 3 mg at 03/22/18 0916  .  NS flush syringe, 3 mL, Intracatheter, Q8HRS, Shelbie Hutching, NP, Stopped at 03/22/18 0600  .  NS flush syringe, 3 mL, Intracatheter, Q1H PRN, Shelbie Hutching, NP  .  NS premix infusion, , Intravenous, Continuous, Shelbie Hutching, NP, Last Rate: 100 mL/hr at 03/22/18 0401  .  ondansetron (ZOFRAN) 2 mg/mL injection, 4 mg, Intravenous, Q4H PRN, Shelbie Hutching, NP  .  pantoprazole (PROTONIX) injection, 40  mg, Intravenous, Daily, Shelbie Hutching, NP, 40 mg at 03/22/18 0934  .  potassium phosphate 30 mmol in NS 500 mL IVPB, 30 mmol, Intravenous, Once, Elana Alm, MD    Facility-Administered Medications Ordered in Other Encounters:   .  cyclopentolate (CYCLOGYL) 1% ophthalmic solution, 1 Drop, Left Eye, PRE-OP Q5 Min, Charlyne Mom, MD  .  lidocaine (XYLOCAINE) 2% mucosal gel, 0.5 mL, Topical, PRE-OP Q15 MIN, Charlyne Mom, MD  .  phenylephrine (AK-DILATE) 2.5% ophthalmic solution, 1 Drop, Left Eye, PRE-OP Q5 Min, Charlyne Mom, MD  .  proparacaine (ALCAINE) 0.5% ophthalmic solution, 1 Drop, Left Eye, PRE-OP Once, Charlyne Mom, MD    Micro: No results found for any visits on 03/22/18 (from the past 96 hour(s)).    Radiology:       ASSESSMENT & RECOMMENDATIONS:   Kelsey [name] is a 67 y.o.female with possible colonic obstruction.    1. I cannot access the CT scan performed at an outside facility.  We have sent this to Radiology to place in our system but I cannot  access this as of yet.  It is reported that the patient has a fat containing umbilical hernia a normal terminal ileum, a previous gastric bypass, and distended colon proximal to and anastomosis near the sigmoid area.  There is a possible stricture distal to this anastomosis.    2. The patient reports mild abdominal pain currently.  She tells me she has not had a bowel movement of significance in a week.  She has had some nausea over the past 4 days and then vomiting yesterday.  No further nausea or vomiting overnight.  She tells me the pain is better controlled.    3. Continue supportive care and rehydration.    4. May need to consider a Gastrografin enema to further evaluate the previous anastomosis and if there is a stricture.    5. Lung mass noted on CT scan.  Will discuss prior findings and treatments with her surgeon and oncology      Melida Quitter, MD  [facility]

## 2018-03-22 NOTE — Care Plan (Signed)
END OF SHIFT NOTE: PRN morphine given times 3.  Up to chir at bedside, to bathroom, and ambulating in hall with walker.  No bowel movement, but stated that she passed some gas today.  NPO status continued.  Resting in chair. VSS.  Ila Mcgill, RN

## 2018-03-22 NOTE — Consults (Signed)
[facility]  Initial Note    Kelsey [name], Kelsey [name]  Date of Admission:  03/22/2018  Date of Service: 03/22/2018  Date of Birth:  Aug 22, 1950    Requesting MD: Elana Alm      Assessment/Plan:  67 y.o.  Female with  Stage IIIB, pT3, TN1a, MX, adenocarcinoma of the sigmoid colon,   Status post 5 cycles of chemotherapy with FOLFOX ( was unable to complete 12 cycle of chemotherapy),  Lost follow-up with medical oncology/surgical oncology now admitted to hospital with concern for bowel obstruction.    1.  Concern for bowel obstruction:  -   CT abdomen completed at outside facility.  -   Surgery consulted  -   Management as per surgery in terms of bowel obstruction    2.  Lung lesion:  Found on CT at outside facility (  Unable to access those images)  -  I had detailed discussion with patient and family regarding lung lesion,  If concerning for malignancy she will need lung lesion biopsy.  If proven to be metastatic disease she will need chemotherapy after taking care of bowel obstruction.  Patient absolutely refused chemotherapy.  Patient understand that without biopsy we cannot determine her disease/stage.  Patient states that she was not able to tolerate chemotherapy in 2016,  and she will never get chemotherapy again.          HPI/Discussion: 67 y.o.  Female with  Stage IIIB, pT3, TN1a, MX, adenocarcinoma of the sigmoid colon,   Status post 5 cycles of chemotherapy with FOLFOX ( was unable to complete 12 cycle of chemotherapy),  Lost follow-up with medical oncology/surgical oncology.  Patient now admitted to hospital with concern for bowel obstruction.  Patient was diagnosed with stage III colon cancer in 2015/16.  She underwent surgical resection followed by adjuvant chemotherapy.  Patient had poor tolerance to  Adjuvant chemotherapy and received only 5 cycles of FOLFOX.  After that patient lost follow-up with medical oncologist /surgery.  Patient did not repeat any colonoscopy or CT scan  after that.  Patient reports  That she is struggling with constipation and abdominal pain.  Her last bowel movement was about 7 days ago.  Patient presented to  Outside facility ER and had CT chest, abdomen and pelvis done.  As per report CT chest, abdomen and pelvis showed Mass in the lung along with postoperative changes in the sigmoid colon with distension to proximal area of anastomosis.    REVIEW OF SYSTEMS  Other than ROS in the HPI, all other systems were negative.    Past Medical History:   Diagnosis Date   . Arthropathy, unspecified, site unspecified    . Asthma    . Cancer (CMS HCC)     cervical   . Cataract    . Colon cancer (CMS [place]) 07/06/2014   . COPD (chronic obstructive pulmonary disease) (CMS HCC)    . Fistula    . HTN (hypertension)    . Hyperlipidemia    . Obesity    . Sleep apnea    . Ventral hernia          Past Surgical History:   Procedure Laterality Date   . BOWEL RESECTION     . CATARACT EXTRACTION     . HX CERVICAL CONE BIOPSY      . HX CESAREAN SECTION     . HX GASTRIC BYPASS      25 years ago   . HX HERNIA  REPAIR      1998, 1999 (x2)   . HX HYSTERECTOMY      late 1990s   . HX LAP BANDING      2013   . HX LAPAROTOMY      2013 to remove lap band         Medications Prior to Admission     Prescriptions    atorvastatin (LIPITOR) 10 mg Oral Tablet    Take 10 mg by mouth Once a day    budesonide-formoterol (SYMBICORT) 160-4.5 mcg/actuation Inhalation HFA Aerosol Inhaler    Take 2 Puffs by inhalation Twice daily    CARVEDILOL ORAL    Take by mouth Every evening     docusate sodium (COLACE) 100 mg Oral Capsule    Take 100 mg by mouth Three times a day    magnesium oxide 200 mg magnesium Oral Tablet    Take 400 mg by mouth Once a day    omeprazole (PRILOSEC) 20 mg Oral Capsule, Delayed Release(E.C.)    Take 20 mg by mouth Once a day    potassium chloride (K-DUR) 20 mEq Oral Tab Sust.Rel. Particle/Crystal    Take 20 mEq by mouth Once a day    prochlorperazine (COMPAZINE) 10 mg Oral Tablet    Take 1  Tab (10 mg total) by mouth Four times a day as needed for nausea/vomiting    traZODone (DESYREL) 100 mg Oral Tablet    Take 100 mg by mouth Every night    VITAMIN B COMPLEX (SUPER B COMPLEX-B-12 ORAL)    Take by mouth    vitamin E (AQUASOL E) 400 unit Oral Capsule    Take 400 Units by mouth Once a day          Current Facility-Administered Medications:  albuterol (PROVENTIL) 2.5 mg / 3 mL (0.083%) neb solution 2.5 mg Nebulization Q4H PRN   budesonide-formoterol (SYMBICORT) 160 mcg-4.5 mcg per inhalation oral inhaler 2 Puff Inhalation 2x/day   morphine 4 mg/mL injection 3 mg Intravenous Q3H PRN   NS flush syringe 3 mL Intracatheter Q8HRS   NS flush syringe 3 mL Intracatheter Q1H PRN   NS premix infusion  Intravenous Continuous   ondansetron (ZOFRAN) 2 mg/mL injection 4 mg Intravenous Q4H PRN   pantoprazole (PROTONIX) injection 40 mg Intravenous Daily   potassium phosphate 30 mmol in NS 500 mL IVPB 30 mmol Intravenous Once     Allergies   Allergen Reactions   . Shellfish Containing Products Shortness of Breath, Rash and Itching     scallops   . Vancomycin Shortness of Breath   . Iodine And Iodide Containing Products      Itching, shortness of breath      Family History  Family Medical History:     Problem Relation (Age of Onset)    Diabetes Mother, Maternal Grandmother, Maternal Grandfather, Father    Heart Attack Mother    Stroke Father          Social History  Social History     Socioeconomic History   . Marital status: Married     Spouse name: Not on file   . Number of children: Not on file   . Years of education: Not on file   . Highest education level: Not on file   Occupational History   . Not on file   Social Needs   . Financial resource strain: Not on file   . Food insecurity:     Worry: Not on file  Inability: Not on file   . Transportation needs:     Medical: Not on file     Non-medical: Not on file   Tobacco Use   . Smoking status: Former Research scientist (life sciences)   . Smokeless tobacco: Never Used   . Tobacco comment: quit  4 yrs ago   Substance and Sexual Activity   . Alcohol use: Yes     Comment: rarely   . Drug use: No   . Sexual activity: Not on file   Lifestyle   . Physical activity:     Days per week: Not on file     Minutes per session: Not on file   . Stress: Not on file   Relationships   . Social connections:     Talks on phone: Not on file     Gets together: Not on file     Attends religious service: Not on file     Active member of club or organization: Not on file     Attends meetings of clubs or organizations: Not on file     Relationship status: Not on file   . Intimate partner violence:     Fear of current or ex partner: Not on file     Emotionally abused: Not on file     Physically abused: Not on file     Forced sexual activity: Not on file   Other Topics Concern   . Ability to Walk 1 Flight of Steps without SOB/CP Not Asked   . Routine Exercise Not Asked   . Ability to Walk 2 Flight of Steps without SOB/CP Not Asked   . Unable to Ambulate Not Asked   . Total Care Not Asked   . Ability To Do Own ADL's Not Asked   . Uses Walker Not Asked   . Other Activity Level Not Asked   . Uses Cane Not Asked   Social History Narrative   . Not on file       Objective     EXAM  VITALS:    Temperature: 36.7 C (98.1 F)  Heart Rate: 78  BP (Non-Invasive): 115/90  Respiratory Rate: 18  SpO2: 94 %  Pain Score (Numeric, Faces): 3  Constitutional:  appears chronically ill  Respiratory:  Clear to auscultation bilaterally.   Cardiovascular:  regular rate and rhythm  Gastrointestinal:  Soft, non-tender  Neurologic:  Grossly normal  Lymphatic/Immunologic/Hematologic:  No lymphadenopathy    IMAGES:  N/A    LABS:    Lab Results Today:    Results for orders placed or performed during the hospital encounter of 03/22/18 (from the past 24 hour(s))   BASIC METABOLIC PANEL   Result Value Ref Range    SODIUM 140 135 - 145 mmol/L    POTASSIUM 3.6 3.5 - 5.0 mmol/L    CHLORIDE 106 98 - 111 mmol/L    CO2 TOTAL 26 21 - 35 mmol/L    ANION GAP 8 mmol/L     CALCIUM 7.4 (L) 8.8 - 10.3 mg/dL    GLUCOSE 122 (H) 70 - 110 mg/dL    BUN 15 10 - 25 mg/dL    CREATININE 0.87 <=1.30 mg/dL    BUN/CREA RATIO 17     ESTIMATED GFR >60 Avg: 85 mL/min/1.22m^2   C-REACTIVE PROTEIN(CRP),INFLAMMATION   Result Value Ref Range    CRP INFLAMMATION 4.8 <=5.0 mg/L   HEPATIC FUNCTION PANEL   Result Value Ref Range    ALBUMIN 3.5 3.2 - 4.6 g/dL  ALKALINE PHOSPHATASE 62 20 - 130 U/L    ALT (SGPT) 35 <=52 U/L    AST (SGOT) 30 <=35 U/L    BILIRUBIN TOTAL 0.6 0.3 - 1.2 mg/dL    BILIRUBIN DIRECT 0.2 <=0.3 mg/dL    PROTEIN TOTAL 5.5 (L) 6.0 - 8.3 g/dL   LDH   Result Value Ref Range    LDH 166 <=250 U/L   MAGNESIUM   Result Value Ref Range    MAGNESIUM 2.8 (H) 1.8 - 2.3 mg/dL   PHOSPHORUS   Result Value Ref Range    PHOSPHORUS 2.1 (L) 2.5 - 4.5 mg/dL   PROCALCITONIN, SERUM   Result Value Ref Range    PROCALCITONIN SERUM 0.05 <0.50 ng/mL   PT/INR   Result Value Ref Range    INR 1.15 (H) 0.80 - 1.10   CBC WITH DIFF   Result Value Ref Range    WBC 9.1 3.5 - 10.3 x10^3/uL    RBC 4.86 3.80 - 5.24 x10^6/uL    HGB 12.9 11.8 - 15.8 g/dL    HCT 42.2 34.6 - 46.2 %    MCV 86.8 82.3 - 96.7 fL    MCH 26.6 (L) 27.6 - 33.2 pg    MCHC 30.6 (L) 32.6 - 35.4 g/dL    RDW 16.2 (H) 12.4 - 15.2 %    PLATELETS 220 140 - 440 x10^3/uL    MPV 8.6 6.6 - 10.2 fL    NEUTROPHIL % 67 %    LYMPHOCYTE % 20 %    MONOCYTE % 10 %    EOSINOPHIL % 3 %    BASOPHIL % 1 %    NEUTROPHIL # 6.10 1.50 - 6.40 x10^3/uL    LYMPHOCYTE # 1.80 0.90 - 3.40 x10^3/uL    MONOCYTE # 0.90 0.20 - 0.90 x10^3/uL    EOSINOPHIL # 0.30 0.00 - 0.50 x10^3/uL    BASOPHIL # 0.10 0.00 - 0.20 x10^3/uL   CARCINOEMBRYONIC ANTIGEN   Result Value Ref Range    CEA 0.7 0.0 - 5.0 ng/mL         Vanetta Mulders, MD

## 2018-03-22 NOTE — Care Plan (Signed)
END OF SHIFT NOTE;  Pt alert and oriented.  Admitted for possible small bowel obstruction and acute abdominal pain.  History of colon cancer and new mass noted in right lower lung.  Surgery consult for today.  Will continue to monitor.  Pt NPO.  Medicated with morphine IV times one.  No complaints of nausea or vomiting.  Continue with plan of care.  BP (!) 154/82   Pulse 71   Temp 36.7 C (98.1 F)   Resp 18   Ht 1.575 m (5\' 2" )   Wt (!) 139.1 kg (306 lb 10.6 oz)   SpO2 95%   BMI 56.09 kg/m   Raquel James, RN

## 2018-03-22 NOTE — Care Plan (Signed)
Problem: Adult Inpatient Plan of Care  Goal: Plan of Care Review  Outcome: Ongoing (see interventions/notes)  Goal: Patient-Specific Goal (Individualization)  Outcome: Ongoing (see interventions/notes)  Goal: Absence of Hospital-Acquired Illness or Injury  Outcome: Ongoing (see interventions/notes)  Goal: Optimal Comfort and Wellbeing  Outcome: Ongoing (see interventions/notes)  Goal: Rounds/Family Conference  Outcome: Ongoing (see interventions/notes)     Problem: Pain Acute  Goal: Optimal Pain Control  Outcome: Ongoing (see interventions/notes)     Problem: Fluid Deficit (Intestinal Obstruction)  Goal: Fluid Balance  Outcome: Ongoing (see interventions/notes)     Problem: Fluid Deficit (Intestinal Obstruction)  Goal: Fluid Balance  Outcome: Ongoing (see interventions/notes)     Problem: Infection (Intestinal Obstruction)  Goal: Absence of Infection Signs/Symptoms  Outcome: Ongoing (see interventions/notes)     Problem: Nausea and Vomiting (Intestinal Obstruction)  Goal: Nausea and Vomiting Relief  Outcome: Ongoing (see interventions/notes)     Problem: Pain (Intestinal Obstruction)  Goal: Acceptable Pain Control  Outcome: Ongoing (see interventions/notes)     Problem: Fall Injury Risk  Goal: Absence of Fall and Fall-Related Injury  Outcome: Ongoing (see interventions/notes)

## 2018-03-22 NOTE — H&P (Signed)
[facility]  Hospitalist  History and Physical    Bunny, Kleist  Date of Admission:  03/22/2018  Date of Birth:  [birth year]    PCP: [facility]  Chief Complaint:    Chief Complaint   Patient presents with   . Pelvic Pain       HPI: Kelsey [name] is a 67 y.o., White female who presents as a transfer from [facility] for further evaluation of constipation and abdominal pain.  The patient has a past medical history of stage III adenocarcinoma of the sigmoid colon status post bowel resection in 2015. The patient underwent adjunct chemotherapy with FOLFOX starting in December of 2015 however, only received 5/12 treatments secondary to recurrent infections with abdominal wall abscesses.  She completed chemotherapy on 11/10/2014.  The patient has since moved to [place] and fallen out of care/follow-up.  She has relocated back to [place] and presented to the transferring facility with a chief complaint of constipation and abdominal pain.  The patient reports since her surgery in 2015, she has had continued variations in her bowel movements from watery stools to constipation.  Normally she treats her symptoms with over-the-counter regimens such as Imodium, Metamucil, or laxatives.  Last week she is experiencing significant watery bowel movements with irritation of hemorrhoids.  She states "I took 2 Imodium because I was just tired of it", after taking the antidiarrheals 1 week ago, she has had no further bowel movements.  The patient reports she has attempted increase her dietary fiber, added Metamucil, started taking Colace and milk of magnesia over the last 3-5 days.  On 03/21/2018 she decided to take a bowel prep that was left over from previous prescriptions for colonoscopies in the past.  After mixing the prep and ingesting majority of it, she developed severe lower quadrant abdominal pain.  She reports that the pain is a 10/10 in severity, is  cramping in nature and is located across the lower quadrants but more pronounced in the right lower quadrant.  She has yet to pass gas or have a bowel movement.  She denies vomiting.  Patient denies any recent weight loss, changes in appetite, night sweats, abnormal swelling, fevers or chills.  She does have a history COPD and obstructive sleep apnea, she is noncompliant with CPAP at baseline is currently not oxygen dependent.  She has a history of bilateral knee pain and is awaiting possible knee replacement of the left knee.  She has limited mobility secondary to knee pain and obesity, ambulating with use of a walker at baseline.    Workup and the transferring facility includes routine labs:  WBC 12.5, hemoglobin 14.6, platelet count 304, sodium 141, potassium 4.2, CO2 24, BUN 15, creatinine 0.8, lactic acid 1.9, lipase 47, AST 49, ALT 52. CT of the chest abdomen and pelvis reveals a right lower lobe lung mass concerning for malignancy or metastatic disease, postoperative changes from gastric resection, postoperative changes in the sigmoid colon with distension to the proximal area of anastomosis.  Vital signs prior to transfer include T 98.4, HR 79, RR 16, sats 97%, BP 147/72.  The patient received normal saline 1 L bolus, a total of 10 mg IV morphine and a total of 8 mg of Zofran.  Imaging results were reportedly reviewed with General surgery, Dr. Christel Mormon, who is agreeable to see the patient in consultation and recommends strict NPO status at this time.  Past Medical History:   Diagnosis Date   . Arthropathy, unspecified, site unspecified    . Asthma    . Cancer (CMS HCC)     cervical   . Cataract    . Colon cancer (CMS [place]) 07/06/2014   . COPD (chronic obstructive pulmonary disease) (CMS HCC)    . Fistula    . HTN (hypertension)    . Hyperlipidemia    . Obesity    . Sleep apnea    . Ventral hernia            Past Surgical History:   Procedure Laterality Date   . BOWEL RESECTION     . CATARACT EXTRACTION       . HX CERVICAL CONE BIOPSY      . HX CESAREAN SECTION     . HX GASTRIC BYPASS      25 years ago   . [place] (x2)   . HX HYSTERECTOMY      late 1990s   . HX LAP BANDING      2013   . HX LAPAROTOMY      2013 to remove lap band           Medications Prior to Admission     Prescriptions    atorvastatin (LIPITOR) 10 mg Oral Tablet    Take 10 mg by mouth Once a day    budesonide-formoterol (SYMBICORT) 160-4.5 mcg/actuation Inhalation HFA Aerosol Inhaler    Take 2 Puffs by inhalation Twice daily    CARVEDILOL ORAL    Take by mouth Every evening     docusate sodium (COLACE) 100 mg Oral Capsule    Take 100 mg by mouth Three times a day    magnesium oxide 200 mg magnesium Oral Tablet    Take 400 mg by mouth Once a day    omeprazole (PRILOSEC) 20 mg Oral Capsule, Delayed Release(E.C.)    Take 20 mg by mouth Once a day    potassium chloride (K-DUR) 20 mEq Oral Tab Sust.Rel. Particle/Crystal    Take 20 mEq by mouth Once a day    prochlorperazine (COMPAZINE) 10 mg Oral Tablet    Take 1 Tab (10 mg total) by mouth Four times a day as needed for nausea/vomiting    traZODone (DESYREL) 100 mg Oral Tablet    Take 100 mg by mouth Every night    VITAMIN B COMPLEX (SUPER B COMPLEX-B-12 ORAL)    Take by mouth    vitamin E (AQUASOL E) 400 unit Oral Capsule    Take 400 Units by mouth Once a day          Allergies   Allergen Reactions   . Shellfish Containing Products Shortness of Breath, Rash and Itching     scallops   . Vancomycin Shortness of Breath   . Iodine And Iodide Containing Products      Itching, shortness of breath        Social History     Tobacco Use   . Smoking status: Former Research scientist (life sciences)   . Smokeless tobacco: Never Used   . Tobacco comment: quit 4 yrs ago   Substance Use Topics   . Alcohol use: Yes     Comment: rarely       Family History:   Family Medical History:     Problem Relation (Age of Onset)    Diabetes Mother, Maternal Grandmother, Maternal Grandfather, Father    Heart  Attack Mother    Stroke  Father              ROS:   Constitutional: No fever, chills or weakness.  Limited mobility secondary to significant arthritis and pain in the knees, ambulates with a walker.    Skin: No rash or diaphoresis  HENT: No headaches or congestion  Eyes: No vision changes   Cardio: No chest pain, palpitations or leg swelling   Respiratory: No cough, wheezing or SOB  GI:  Mild nausea without vomiting.  Intermittent episodes of diarrhea alternating with constipation, see HPI    GU:  No urinary changes  MSK:  Chronic bilateral knee pain  Neuro: No seizures or LOC  Psychiatric: No depression, SI or substance abuse  All other systems reviewed and are negative.    EXAM:  Vitals: BP (!) 154/82   Pulse 71   Temp 36.7 C (98.1 F)   Resp 18   Ht 1.575 m (5\' 2" )   Wt (!) 139.1 kg (306 lb 10.6 oz)   SpO2 95%   BMI 56.09 kg/m       Nursing note and vitals reviewed.   Constitutional: Patient is in no acute distress.    HEENT: Head normocephalic and atraumatic.   Eyes: PERRL.  Neck: Supple. Soft, FROM  Lungs: Clear to auscultation bilaterally without wheezes, rales or rhonchi.  Cardiovascular: Regular rate and rhythm. No murmur, rub, or gallop. Pulses strong and equal bilaterally.  No peripheral edema.  Abdomen:  Hypoactive bowel sounds.  Soft, umbilical hernia noted on palpation which is soft and nontender.  Abdomen is obese with multiple old surgical scars noted.  Positive tenderness to palpation in the lower quadrants bilaterally.  MSK/Extremities: Moves all extremities.  Equal strength throughout.   Skin: No lesions noted.  No rashes.  Multiple surgical scars noted to the abdomen.    Neuro: Normal coordination. CNs 2-12 grossly intact. No facial droop noted.    Psych: Alert and oriented to person, place, and time. Normal affect    Labs:  See HPI          Micro: No results found for any visits on 03/22/18 (from the past 96 hour(s)).    Radiology:    see HPI    Assessment/ Plan:   Active Hospital Problems    Diagnosis   .  Primary Problem: Colonic obstruction (CMS HCC)   . Abdominal pain   . Right lower lobe lung mass   . COPD (chronic obstructive pulmonary disease) (CMS HCC)   . HTN (hypertension)   . Obesity       Colonic obstruction  abdominal pain  -mass versus stenosis of anastomosis  -NPO  -consult Dr. Christel Mormon  -morphine 3 mg IV q.3 hours p.r.n. for severe pain  -Zofran 4 mg q.4 hours p.r.n. For nausea  -IV fluids    Right lower lobe lung mass  -concerning for possible metastasis  -consult Oncology    COPD  -no signs of acute exacerbation, saturations stable on room air  -continue baseline inhalers  -albuterol nebulizers as needed for wheezing or shortness of breath    Hypertension  -currently stable, will review baseline medications and continue as appropriate      DVT/PE Prophylaxis: SCDs/ Venodynes/Impulse boots, will avoid chemical anticoagulation until fully evaluated by General surgery  DNR Status:  Full Code    Shelbie Hutching, NP  03/22/2018, 04:21    Addendum:  Patient personally seen and examined by me at bedside.  Briefly, 67 year old female with past medical history of colon cancer status post bowel resection and chemotherapy was transferred from outside hospital with abdominal distention, pain, and inability to have bowel movement or pass gas..  Imaging at outside hospital were concerning for right lower lobe mass he with possibility of malignancy/metastasis, as well as sigmoid colon distention to the proximal area of anastomosis in the previous postoperative changes.  Patient was admitted for pain control and Surgery/hematology are consulted.  NPO and IV fluids.    Asencion Noble, MD

## 2018-03-23 DIAGNOSIS — Z85038 Personal history of other malignant neoplasm of large intestine: Secondary | ICD-10-CM

## 2018-03-23 DIAGNOSIS — Z9884 Bariatric surgery status: Secondary | ICD-10-CM

## 2018-03-23 DIAGNOSIS — K56609 Unspecified intestinal obstruction, unspecified as to partial versus complete obstruction: Secondary | ICD-10-CM

## 2018-03-23 LAB — BASIC METABOLIC PANEL
ANION GAP: 5 mmol/L
BUN/CREA RATIO: 14
BUN: 11 mg/dL (ref 10–25)
CALCIUM: 7 mg/dL — ABNORMAL LOW (ref 8.8–10.3)
CHLORIDE: 108 mmol/L (ref 98–111)
CO2 TOTAL: 27 mmol/L (ref 21–35)
CO2 TOTAL: 27 mmol/L (ref 21–35)
CREATININE: 0.78 mg/dL (ref <=1.30)
CREATININE: 0.78 mg/dL (ref <=1.30)
ESTIMATED GFR: >60 mL/min/{1.73_m2}
GLUCOSE: 79 mg/dL (ref 70–110)
POTASSIUM: 3.9 mmol/L (ref 3.5–5.0)
SODIUM: 140 mmol/L (ref 135–145)

## 2018-03-23 LAB — CBC WITH DIFF
BASOPHIL #: 0 10*3/uL (ref 0.00–0.20)
BASOPHIL %: 1 %
EOSINOPHIL #: 0.3 10*3/uL (ref 0.00–0.50)
EOSINOPHIL %: 6 %
HCT: 36.9 % (ref 34.6–46.2)
HGB: 12.1 g/dL (ref 11.8–15.8)
LYMPHOCYTE #: 1.6 10*3/uL (ref 0.90–3.40)
LYMPHOCYTE %: 30 %
MCH: 26.7 pg — ABNORMAL LOW (ref 27.6–33.2)
MCHC: 32.7 g/dL (ref 32.6–35.4)
MCV: 81.5 fL — ABNORMAL LOW (ref 82.3–96.7)
MONOCYTE #: 0.7 10*3/uL (ref 0.20–0.90)
MONOCYTE %: 12 %
MPV: 8.9 fL (ref 6.6–10.2)
NEUTROPHIL #: 2.7 10*3/uL (ref 1.50–6.40)
NEUTROPHIL %: 51 %
PLATELETS: 184 10*3/uL (ref 140–440)
RBC: 4.53 10*6/uL (ref 3.80–5.24)
RDW: 15.8 % — ABNORMAL HIGH (ref 12.4–15.2)
WBC: 5.4 10*3/uL (ref 3.5–10.3)

## 2018-03-23 LAB — MAGNESIUM: MAGNESIUM: 2.5 mg/dL — ABNORMAL HIGH (ref 1.8–2.3)

## 2018-03-23 LAB — PHOSPHORUS: PHOSPHORUS: 2 mg/dL — ABNORMAL LOW (ref 2.5–4.5)

## 2018-03-23 LAB — PT/INR: INR: 1.12 — ABNORMAL HIGH (ref 0.80–1.10)

## 2018-03-23 MED ORDER — MORPHINE 2 MG/ML INTRAVENOUS SYRINGE
2.00 mg | INJECTION | INTRAVENOUS | Status: DC | PRN
Start: 2018-03-23 — End: 2018-03-27
  Administered 2018-03-23 – 2018-03-26 (×10): 2 mg via INTRAVENOUS
  Filled 2018-03-23 (×11): qty 1

## 2018-03-23 MED ORDER — ENOXAPARIN 40 MG/0.4 ML SUBCUTANEOUS SYRINGE
40.00 mg | INJECTION | SUBCUTANEOUS | Status: DC
Start: 2018-03-23 — End: 2018-03-26
  Administered 2018-03-23 – 2018-03-25 (×3): 40 mg via SUBCUTANEOUS
  Filled 2018-03-23 (×5): qty 0.4

## 2018-03-23 MED ADMIN — herbal drugs lotion: RESPIRATORY_TRACT | @ 20:00:00 | NDC 0259060180

## 2018-03-23 MED ADMIN — sodium chloride 0.9 % intravenous solution: INTRAVENOUS | @ 15:00:00 | NDC 00338004904

## 2018-03-23 NOTE — Care Plan (Signed)
Problem: Adult Inpatient Plan of Care  Goal: Plan of Care Review  Outcome: Ongoing (see interventions/notes)  Goal: Patient-Specific Goal (Individualization)  Outcome: Ongoing (see interventions/notes)  Goal: Absence of Hospital-Acquired Illness or Injury  Outcome: Ongoing (see interventions/notes)  Goal: Optimal Comfort and Wellbeing  Outcome: Ongoing (see interventions/notes)  Goal: Rounds/Family Conference  Outcome: Ongoing (see interventions/notes)     Problem: Pain Acute  Goal: Optimal Pain Control  Outcome: Ongoing (see interventions/notes)     Problem: Constipation  Goal: Effective Bowel Elimination  Outcome: Ongoing (see interventions/notes)     Problem: Fluid Deficit (Intestinal Obstruction)  Goal: Fluid Balance  Outcome: Ongoing (see interventions/notes)     Problem: Infection (Intestinal Obstruction)  Goal: Absence of Infection Signs/Symptoms  Outcome: Ongoing (see interventions/notes)     Problem: Nausea and Vomiting (Intestinal Obstruction)  Goal: Nausea and Vomiting Relief  Outcome: Ongoing (see interventions/notes)     Problem: Pain (Intestinal Obstruction)  Goal: Acceptable Pain Control  Outcome: Ongoing (see interventions/notes)     Problem: Fall Injury Risk  Goal: Absence of Fall and Fall-Related Injury  Outcome: Ongoing (see interventions/notes)

## 2018-03-23 NOTE — Care Plan (Signed)
END OF SHIFT NOTE: Pt was up to chair at bedside much of the day.  Ambulated in hall with walker.  Continued NPO status.  Administered PRN morphine for pain and zofran for nausea.  VSS.   Ila Mcgill, RN

## 2018-03-23 NOTE — Consults (Signed)
[place]  Follow-up note    Kyley, Solow  Date of Admission:  03/22/2018  Date of Service: 03/23/2018  Date of Birth:  1951/01/18    Requesting MD: Elana Alm      Assessment/Plan:  67 y.o.  Female with  Stage IIIB, pT3, TN1a, MX, adenocarcinoma of the sigmoid colon,   Status post 5 cycles of chemotherapy with FOLFOX ( was unable to complete 12 cycle of chemotherapy),  Lost follow-up with medical oncology/surgical oncology now admitted to hospital with concern for bowel obstruction.    1.  Concern for bowel obstruction:  I had detailed discussion with patient.  Of course concern for bowel obstruction is priority at this point.  Patient is extremely worried about chemotherapy and does not want any treatment in terms of cancer.  I reassured patient to focus on a acute management of abdominal pain/obstruction for now.  No need for discussion of chemotherapy at this point.    -   CT abdomen completed at outside facility.  -   Surgery consulted  -   Management as per surgery in terms of bowel obstruction    2.  Lung lesion:  Found on CT at outside facility (  Unable to access those images)  -  I had detailed discussion with patient and family regarding lung lesion,  If concerning for malignancy she will need lung lesion biopsy.  If proven to be metastatic disease she will need chemotherapy after taking care of bowel obstruction.  Patient absolutely refused chemotherapy.  Patient understand that without biopsy we cannot determine her disease/stage.  Patient states that she was not able to tolerate chemotherapy in 2016,  and she will never get chemotherapy again.        Subjective:  No new issues overnight.  Continued to have abdominal pain.  Did not had any bowel movements as per patient.  Denies any fevers or chills.    REVIEW OF SYSTEMS  Other than ROS in the HPI, all other systems were negative.    Past Medical History:   Diagnosis Date   . Arthropathy, unspecified, site  unspecified    . Asthma    . Cancer (CMS HCC)     cervical   . Cataract    . Colon cancer (CMS [place]) 07/06/2014   . COPD (chronic obstructive pulmonary disease) (CMS HCC)    . Fistula    . HTN (hypertension)    . Hyperlipidemia    . Obesity    . Sleep apnea    . Ventral hernia          Past Surgical History:   Procedure Laterality Date   . BOWEL RESECTION     . CATARACT EXTRACTION     . HX CERVICAL CONE BIOPSY      . HX CESAREAN SECTION     . HX GASTRIC BYPASS      25 years ago   . [place] (x2)   . HX HYSTERECTOMY      late 1990s   . HX LAP BANDING      2013   . HX LAPAROTOMY      2013 to remove lap band         Medications Prior to Admission     Prescriptions    atorvastatin (LIPITOR) 10 mg Oral Tablet    Take 10 mg by mouth Once a day    budesonide-formoterol (SYMBICORT) 160-4.5  mcg/actuation Inhalation HFA Aerosol Inhaler    Take 2 Puffs by inhalation Twice daily    CARVEDILOL ORAL    Take by mouth Every evening     docusate sodium (COLACE) 100 mg Oral Capsule    Take 100 mg by mouth Three times a day    magnesium oxide 200 mg magnesium Oral Tablet    Take 400 mg by mouth Once a day    omeprazole (PRILOSEC) 20 mg Oral Capsule, Delayed Release(E.C.)    Take 20 mg by mouth Once a day    potassium chloride (K-DUR) 20 mEq Oral Tab Sust.Rel. Particle/Crystal    Take 20 mEq by mouth Once a day    prochlorperazine (COMPAZINE) 10 mg Oral Tablet    Take 1 Tab (10 mg total) by mouth Four times a day as needed for nausea/vomiting    traZODone (DESYREL) 100 mg Oral Tablet    Take 100 mg by mouth Every night    VITAMIN B COMPLEX (SUPER B COMPLEX-B-12 ORAL)    Take by mouth    vitamin E (AQUASOL E) 400 unit Oral Capsule    Take 400 Units by mouth Once a day          Current Facility-Administered Medications:  albuterol (PROVENTIL) 2.5 mg / 3 mL (0.083%) neb solution 2.5 mg Nebulization Q4H PRN   budesonide-formoterol (SYMBICORT) 160 mcg-4.5 mcg per inhalation oral inhaler 2 Puff Inhalation 2x/day      enoxaparin PF (LOVENOX) 40 mg/0.4 mL SubQ injection 40 mg Subcutaneous Q24H   morphine 2 mg/mL injection 2 mg Intravenous Q4H PRN   NS flush syringe 3 mL Intracatheter Q8HRS   NS flush syringe 3 mL Intracatheter Q1H PRN   NS premix infusion  Intravenous Continuous   ondansetron (ZOFRAN) 2 mg/mL injection 4 mg Intravenous Q4H PRN   pantoprazole (PROTONIX) injection 40 mg Intravenous Daily     Allergies   Allergen Reactions   . Shellfish Containing Products Shortness of Breath, Rash and Itching     scallops   . Vancomycin Shortness of Breath   . Iodine And Iodide Containing Products      Itching, shortness of breath      Family History  Family Medical History:     Problem Relation (Age of Onset)    Diabetes Mother, Maternal Grandmother, Maternal Grandfather, Father    Heart Attack Mother    Stroke Father          Social History  Social History     Socioeconomic History   . Marital status: Married     Spouse name: Not on file   . Number of children: Not on file   . Years of education: Not on file   . Highest education level: Not on file   Occupational History   . Not on file   Social Needs   . Financial resource strain: Not on file   . Food insecurity:     Worry: Not on file     Inability: Not on file   . Transportation needs:     Medical: Not on file     Non-medical: Not on file   Tobacco Use   . Smoking status: Former Research scientist (life sciences)   . Smokeless tobacco: Never Used   . Tobacco comment: quit 4 yrs ago   Substance and Sexual Activity   . Alcohol use: Yes     Comment: rarely   . Drug use: No   . Sexual activity: Not on file  Lifestyle   . Physical activity:     Days per week: Not on file     Minutes per session: Not on file   . Stress: Not on file   Relationships   . Social connections:     Talks on phone: Not on file     Gets together: Not on file     Attends religious service: Not on file     Active member of club or organization: Not on file     Attends meetings of clubs or organizations: Not on file     Relationship  status: Not on file   . Intimate partner violence:     Fear of current or ex partner: Not on file     Emotionally abused: Not on file     Physically abused: Not on file     Forced sexual activity: Not on file   Other Topics Concern   . Ability to Walk 1 Flight of Steps without SOB/CP Not Asked   . Routine Exercise Not Asked   . Ability to Walk 2 Flight of Steps without SOB/CP Not Asked   . Unable to Ambulate Not Asked   . Total Care Not Asked   . Ability To Do Own ADL's Not Asked   . Uses Walker Not Asked   . Other Activity Level Not Asked   . Uses Cane Not Asked   Social History Narrative   . Not on file       Objective     EXAM  VITALS:    Temperature: 36.7 C (98.1 F)  Heart Rate: 69  BP (Non-Invasive): (!) 153/72  Respiratory Rate: 18  SpO2: 96 %  Pain Score (Numeric, Faces): 5  Constitutional:  appears chronically ill  Respiratory:  Clear to auscultation bilaterally.   Cardiovascular:  regular rate and rhythm  Gastrointestinal:  Soft, non-tender  Neurologic:  Grossly normal  Lymphatic/Immunologic/Hematologic:  No lymphadenopathy    IMAGES:  N/A    LABS:    Lab Results Today:    Results for orders placed or performed during the hospital encounter of 03/22/18 (from the past 24 hour(s))   BASIC METABOLIC PANEL, NON-FASTING   Result Value Ref Range    SODIUM 140 135 - 145 mmol/L    POTASSIUM 3.9 3.5 - 5.0 mmol/L    CHLORIDE 108 98 - 111 mmol/L    CO2 TOTAL 27 21 - 35 mmol/L    ANION GAP 5 mmol/L    CALCIUM 7.0 (L) 8.8 - 10.3 mg/dL    GLUCOSE 79 70 - 110 mg/dL    BUN 11 10 - 25 mg/dL    CREATININE 0.78 <=1.30 mg/dL    BUN/CREA RATIO 14     ESTIMATED GFR >60 Avg: 85 mL/min/1.32m^2   MAGNESIUM   Result Value Ref Range    MAGNESIUM 2.5 (H) 1.8 - 2.3 mg/dL   PHOSPHORUS   Result Value Ref Range    PHOSPHORUS 2.0 (L) 2.5 - 4.5 mg/dL   PT/INR   Result Value Ref Range    INR 1.12 (H) 0.80 - 1.10   CBC WITH DIFF   Result Value Ref Range    WBC 5.4 3.5 - 10.3 x10^3/uL    RBC 4.53 3.80 - 5.24 x10^6/uL    HGB 12.1 11.8 - 15.8  g/dL    HCT 36.9 34.6 - 46.2 %    MCV 81.5 (L) 82.3 - 96.7 fL    MCH 26.7 (L) 27.6 - 33.2 pg  MCHC 32.7 32.6 - 35.4 g/dL    RDW 15.8 (H) 12.4 - 15.2 %    PLATELETS 184 140 - 440 x10^3/uL    MPV 8.9 6.6 - 10.2 fL    NEUTROPHIL % 51 %    LYMPHOCYTE % 30 %    MONOCYTE % 12 %    EOSINOPHIL % 6 %    BASOPHIL % 1 %    NEUTROPHIL # 2.70 1.50 - 6.40 x10^3/uL    LYMPHOCYTE # 1.60 0.90 - 3.40 x10^3/uL    MONOCYTE # 0.70 0.20 - 0.90 x10^3/uL    EOSINOPHIL # 0.30 0.00 - 0.50 x10^3/uL    BASOPHIL # 0.00 0.00 - 0.20 x10^3/uL         Vanetta Mulders, MD

## 2018-03-23 NOTE — Progress Notes (Signed)
Hospitalist Progress Note    Kelsey [name]  Date of service: 03/23/2018  Date of Admission:  03/22/2018  Hospital Day:  LOS: 1 day     Subjective: feels ok. Says no BM but passing flatus. No vomiting. Breathing ok.    Vital Signs:  Temp (24hrs) Max:36.9 C (22.2 F)      Systolic (97LGX), QJJ:941 , Min:117 , DEY:814     Diastolic (48JEH), UDJ:49, Min:46, Max:72    Temp  Avg: 36.8 C (98.2 F)  Min: 36.7 C (98.1 F)  Max: 36.9 C (98.4 F)  Pulse  Avg: 67.5  Min: 65  Max: 71  Resp  Avg: 18  Min: 18  Max: 18  SpO2  Avg: 96.3 %  Min: 95 %  Max: 98 %  Pain Score (Numeric, Faces): 5    I/O:  I/O last 24 hours:      Intake/Output Summary (Last 24 hours) at 03/23/2018 1346  Last data filed at 03/23/2018 0500  Gross per 24 hour   Intake 1000 ml   Output --   Net 1000 ml     I/O current shift:  No intake/output data recorded.        Current Facility-Administered Medications:  albuterol (PROVENTIL) 2.5 mg / 3 mL (0.083%) neb solution 2.5 mg Nebulization Q4H PRN   budesonide-formoterol (SYMBICORT) 160 mcg-4.5 mcg per inhalation oral inhaler 2 Puff Inhalation 2x/day   enoxaparin PF (LOVENOX) 40 mg/0.4 mL SubQ injection 40 mg Subcutaneous Q24H   morphine 2 mg/mL injection 2 mg Intravenous Q4H PRN   NS flush syringe 3 mL Intracatheter Q8HRS   NS flush syringe 3 mL Intracatheter Q1H PRN   NS premix infusion  Intravenous Continuous   ondansetron (ZOFRAN) 2 mg/mL injection 4 mg Intravenous Q4H PRN   pantoprazole (PROTONIX) injection 40 mg Intravenous Daily       Physical Exam:  GENERAL:obese, no distress  CV: regular  HEENT: moist mucosa  ABD: obese, soft.  LUNGS: clear.    Labs:    CBC  (Last 24 hours)    Date/Time WBC HGB HCT MCV PLATELETS    03/23/18 0714 5.4    12.1    36.9    81.5 (L)    184           BMP  (Last 24 hours)    Date/Time Na K Cl CO2 BUN CREAT Calcium Glucose    03/23/18 0714 140    3.9    108    27    11    0.78    7.0 (L)    79           Recent Labs     03/23/18  0714   MAGNESIUM 2.5*   PHOSPHORUS 2.0*     Recent  Labs     03/23/18  0714   INR 1.12*     No results found for: UHCEASTTROPI      Imaging:           Microbiology:  No results found for any visits on 03/22/18 (from the past 96 hour(s)).          Assessment/ Plan:   Colonic obstruction  abdominal pain  -mass versus stenosis of anastomosis  -Dr. Christel Mormon following.  - Being managed conservatively for now.    Right lower lobe lung mass  -concerning for possible metastasis  -consulted Oncology. Patient not interested in chemotherapy.    COPD  -no signs of acute exacerbation, saturations stable on  room air  -continue baseline inhalers  -albuterol nebulizers as needed for wheezing or shortness of breath    Hypertension  -currently stable, will review baseline medications and continue as appropriate      DVT/PE Prophylaxis: SCDs/ Venodynes/Impulse boots    Victory Dakin, MD8/4/201913:46  [place]

## 2018-03-23 NOTE — Care Plan (Signed)
END OF SHIFT NOTE;  Pt alert and oriented.  Medicated for pain and nausea times one.  No reports of bowel movement.  Voiding in toilet.  Iv infusing with no problems.  NPO.  Pt wanting something to eat.  Abdomen distended.  Will continue with plan of care.  Continue to monitor for bowel sounds and movements.  BP (!) 136/56   Pulse 65   Temp 36.8 C (98.2 F)   Resp 18   Ht 1.575 m (5\' 2" )   Wt (!) 139.1 kg (306 lb 10.6 oz)   SpO2 95%   BMI 56.09 kg/m   Raquel James, RN

## 2018-03-23 NOTE — Progress Notes (Signed)
[facility] General Surgery Progress Note    Kelsey [name]       67 y.o.       Date of service: 03/23/2018  Date of Admission:  03/22/2018    Hospital Day:  LOS: 1 day     SUBJECTIVE:  67 y.o.female with sigmoid colon cancer status post resection and now on new lung mass.  The patient has possible colonic obstruction versus a narrowing distal to the anastomosis.  The patient denies any bowel movements.  She does report small amount of flatus at times.  She denies fevers chills or nausea.  She tells me her abdomen feels somewhat better than previous.    PAST MEDICAL HISTORY:  Past Medical History:   Diagnosis Date   . Arthropathy, unspecified, site unspecified    . Asthma    . Cancer (CMS HCC)     cervical   . Cataract    . Colon cancer (CMS [place]) 07/06/2014   . COPD (chronic obstructive pulmonary disease) (CMS HCC)    . Fistula    . HTN (hypertension)    . Hyperlipidemia    . Obesity    . Sleep apnea    . Ventral hernia          PAST SURGICAL HISTORY:  Past Surgical History:   Procedure Laterality Date   . BOWEL RESECTION     . CATARACT EXTRACTION     . HX CERVICAL CONE BIOPSY      . HX CESAREAN SECTION     . HX GASTRIC BYPASS      25 years ago   . [place] (x2)   . HX HYSTERECTOMY      late 1990s   . HX LAP BANDING      2013   . HX LAPAROTOMY      2013 to remove lap band         FAMILY HISTORY:  Family Medical History:     Problem Relation (Age of Onset)    Diabetes Mother, Maternal Grandmother, Maternal Grandfather, Father    Heart Attack Mother    Stroke Father            SOCIAL HISTORY:  Social History     Socioeconomic History   . Marital status: Married     Spouse name: Not on file   . Number of children: Not on file   . Years of education: Not on file   . Highest education level: Not on file   Occupational History   . Not on file   Social Needs   . Financial resource strain: Not on file   . Food insecurity:     Worry: Not on file     Inability: Not on file   . Transportation needs:      Medical: Not on file     Non-medical: Not on file   Tobacco Use   . Smoking status: Former Research scientist (life sciences)   . Smokeless tobacco: Never Used   . Tobacco comment: quit 4 yrs ago   Substance and Sexual Activity   . Alcohol use: Yes     Comment: rarely   . Drug use: No   . Sexual activity: Not on file   Lifestyle   . Physical activity:     Days per week: Not on file     Minutes per session: Not on file   . Stress: Not on file   Relationships   .  Social connections:     Talks on phone: Not on file     Gets together: Not on file     Attends religious service: Not on file     Active member of club or organization: Not on file     Attends meetings of clubs or organizations: Not on file     Relationship status: Not on file   . Intimate partner violence:     Fear of current or ex partner: Not on file     Emotionally abused: Not on file     Physically abused: Not on file     Forced sexual activity: Not on file   Other Topics Concern   . Ability to Walk 1 Flight of Steps without SOB/CP Not Asked   . Routine Exercise Not Asked   . Ability to Walk 2 Flight of Steps without SOB/CP Not Asked   . Unable to Ambulate Not Asked   . Total Care Not Asked   . Ability To Do Own ADL's Not Asked   . Uses Walker Not Asked   . Other Activity Level Not Asked   . Uses Cane Not Asked   Social History Narrative   . Not on file       ALLERGIES:  Allergies   Allergen Reactions   . Shellfish Containing Products Shortness of Breath, Rash and Itching     scallops   . Vancomycin Shortness of Breath   . Iodine And Iodide Containing Products      Itching, shortness of breath      PROBLEM LIST:  Patient Active Problem List   Diagnosis   . HTN (hypertension)   . Hyperlipidemia   . Obesity   . Arthropathy, unspecified, site unspecified   . COPD (chronic obstructive pulmonary disease) (CMS HCC)   . Ventral hernia   . Colon cancer (CMS HCC)   . Colonic obstruction (CMS HCC)   . Abdominal pain   . Right lower lobe lung mass     MEDLIST:    Current Facility-Administered  Medications:   .  albuterol (PROVENTIL) 2.5 mg / 3 mL (0.083%) neb solution, 2.5 mg, Nebulization, Q4H PRN, Shelbie Hutching, NP  .  budesonide-formoterol (SYMBICORT) 160 mcg-4.5 mcg per inhalation oral inhaler, 2 Puff, Inhalation, 2x/day, Shelbie Hutching, NP, 2 Puff at 03/22/18 2119  .  morphine 4 mg/mL injection, 3 mg, Intravenous, Q3H PRN, Shelbie Hutching, NP, 3 mg at 03/23/18 0830  .  NS flush syringe, 3 mL, Intracatheter, Q8HRS, Shelbie Hutching, NP, Stopped at 03/22/18 0600  .  NS flush syringe, 3 mL, Intracatheter, Q1H PRN, Shelbie Hutching, NP  .  NS premix infusion, , Intravenous, Continuous, Shelbie Hutching, NP, Last Rate: 100 mL/hr at 03/23/18 0540  .  ondansetron (ZOFRAN) 2 mg/mL injection, 4 mg, Intravenous, Q4H PRN, Shelbie Hutching, NP, 4 mg at 03/23/18 [phone]  .  pantoprazole (PROTONIX) injection, 40 mg, Intravenous, Daily, Shelbie Hutching, NP, 40 mg at 03/22/18 4481    Facility-Administered Medications Ordered in Other Encounters:   .  cyclopentolate (CYCLOGYL) 1% ophthalmic solution, 1 Drop, Left Eye, PRE-OP Q5 Min, Charlyne Mom, MD  .  lidocaine (XYLOCAINE) 2% mucosal gel, 0.5 mL, Topical, PRE-OP Q15 MIN, Charlyne Mom, MD  .  phenylephrine (AK-DILATE) 2.5% ophthalmic solution, 1 Drop, Left Eye, PRE-OP Q5 Min, Charlyne Mom, MD  .  proparacaine (ALCAINE) 0.5% ophthalmic solution, 1 Drop, Left Eye, PRE-OP Once, Charlyne Mom, MD  PHYSICAL EXAMINATION:  Filed Vitals:    03/22/18 1514 03/22/18 2000 03/23/18 0415 03/23/18 0750   BP: (!) 117/46 (!) 136/56 (!) 153/57 (!) 153/72   Pulse: 65 65 71 69   Resp: 18 18 18 18    Temp: 36.9 C (98.4 F) 36.8 C (98.2 F) 36.7 C (98.1 F) 36.7 C (98.1 F)   SpO2: 96% 95% 98% 96%       Intake/Output Summary (Last 24 hours) at 03/23/2018 0315  Last data filed at 03/23/2018 0500  Gross per 24 hour   Intake 1500 ml   Output -   Net 1500 ml       I have reviewed the vitals.  General: Pleasant, awake, alert, no acute distress.   HEENT:  No scleral icterus or conjunctival injection. Bilateral TM pearly grey without erythema or bulging. No posterior oropharynx erythema or exudate. No oral ulcer or thrush.   Lymph: No anterior or posterior head or neck lymphadenopathy.   CV: RRR without murmurs.   Respiratory: Lungs CTA bilateral, anterior and posterior lung fields without wheezing or crackles.   Abdomen: No palpable hepatosplenomegaly.  No significant tenderness.  Moderately distended.  Obese no rebound or guarding.  Extremities: No evidence of edema  Neurological: CN II-XII grossly intact. AA&O x 3 without deficits. Speech clear. Upper and lower extremity strength and sensation symmetrical and intact.   Skin: warm, pink, moist      Labs:     Results for orders placed or performed during the hospital encounter of 03/22/18 (from the past 24 hour(s))   BASIC METABOLIC PANEL, NON-FASTING   Result Value Ref Range    SODIUM 140 135 - 145 mmol/L    POTASSIUM 3.9 3.5 - 5.0 mmol/L    CHLORIDE 108 98 - 111 mmol/L    CO2 TOTAL 27 21 - 35 mmol/L    ANION GAP 5 mmol/L    CALCIUM 7.0 (L) 8.8 - 10.3 mg/dL    GLUCOSE 79 70 - 110 mg/dL    BUN 11 10 - 25 mg/dL    CREATININE 0.78 <=1.30 mg/dL    BUN/CREA RATIO 14     ESTIMATED GFR >60 Avg: 85 mL/min/1.70m^2   CBC/DIFF    Narrative    The following orders were created for panel order CBC/DIFF.  Procedure                               Abnormality         Status                     ---------                               -----------         ------                     CBC WITH XYVO[[id number]]                Abnormal            Final result                 Please view results for these tests on the individual orders.   MAGNESIUM   Result Value Ref Range    MAGNESIUM 2.5 (H) 1.8 - 2.3 mg/dL   PHOSPHORUS   Result  Value Ref Range    PHOSPHORUS 2.0 (L) 2.5 - 4.5 mg/dL   PT/INR   Result Value Ref Range    INR 1.12 (H) 0.80 - 1.10    Narrative    Coumadin Therapy INR range for Conventional Anticoagulation Therapy is 2.0 to 3.0  and for Intensive Anticoagulation Therapy 2.5 to 3.5   CBC WITH DIFF   Result Value Ref Range    WBC 5.4 3.5 - 10.3 x10^3/uL    RBC 4.53 3.80 - 5.24 x10^6/uL    HGB 12.1 11.8 - 15.8 g/dL    HCT 36.9 34.6 - 46.2 %    MCV 81.5 (L) 82.3 - 96.7 fL    MCH 26.7 (L) 27.6 - 33.2 pg    MCHC 32.7 32.6 - 35.4 g/dL    RDW 15.8 (H) 12.4 - 15.2 %    PLATELETS 184 140 - 440 x10^3/uL    MPV 8.9 6.6 - 10.2 fL    NEUTROPHIL % 51 %    LYMPHOCYTE % 30 %    MONOCYTE % 12 %    EOSINOPHIL % 6 %    BASOPHIL % 1 %    NEUTROPHIL # 2.70 1.50 - 6.40 x10^3/uL    LYMPHOCYTE # 1.60 0.90 - 3.40 x10^3/uL    MONOCYTE # 0.70 0.20 - 0.90 x10^3/uL    EOSINOPHIL # 0.30 0.00 - 0.50 x10^3/uL    BASOPHIL # 0.00 0.00 - 0.20 x10^3/uL       I have reviewed all labs.    Micro: No results found for any visits on 03/22/18 (from the past 96 hour(s)).    Radiology:       ASSESSMENT & RECOMMENDATIONS:   67 y.o.female status post sigmoid colon resection in the past now with possible narrowing of the colon.    1. Abdomen not acute.    2. We will attempt Gastrografin enema tomorrow to evaluate colon near the anastomosis to see if there is a stricture.    3. Continue NPO and IV fluids for now.    4. Patient is concerned about possible attempted chemotherapy.  She tells me this nearly killed her past.  I explained to her we will have to workup the lung mass but certainly we need to learn what might be causing problems with her abdomen as a priority.      Melida Quitter, MD  [facility]

## 2018-03-24 ENCOUNTER — Inpatient Hospital Stay (HOSPITAL_COMMUNITY): Payer: Medicare Other | Admitting: Radiology

## 2018-03-24 DIAGNOSIS — Z9884 Bariatric surgery status: Secondary | ICD-10-CM

## 2018-03-24 DIAGNOSIS — K56609 Unspecified intestinal obstruction, unspecified as to partial versus complete obstruction: Secondary | ICD-10-CM

## 2018-03-24 DIAGNOSIS — R918 Other nonspecific abnormal finding of lung field: Secondary | ICD-10-CM

## 2018-03-24 DIAGNOSIS — Z87891 Personal history of nicotine dependence: Secondary | ICD-10-CM

## 2018-03-24 DIAGNOSIS — Z85038 Personal history of other malignant neoplasm of large intestine: Secondary | ICD-10-CM

## 2018-03-24 DIAGNOSIS — K429 Umbilical hernia without obstruction or gangrene: Secondary | ICD-10-CM

## 2018-03-24 LAB — CBC WITH DIFF
BASOPHIL #: 0 10*3/uL (ref 0.00–0.20)
BASOPHIL #: 0 x10ˆ3/uL (ref 0.00–0.20)
BASOPHIL %: 1 %
EOSINOPHIL #: 0.3 x10ˆ3/uL (ref 0.00–0.50)
EOSINOPHIL %: 6 %
HCT: 36.8 % (ref 34.6–46.2)
HGB: 11.9 g/dL (ref 11.8–15.8)
LYMPHOCYTE #: 1.7 x10ˆ3/uL (ref 0.90–3.40)
LYMPHOCYTE %: 32 %
MCH: 26.2 pg — ABNORMAL LOW (ref 27.6–33.2)
MCHC: 32.2 g/dL — ABNORMAL LOW (ref 32.6–35.4)
MCV: 81.4 fL — ABNORMAL LOW (ref 82.3–96.7)
MONOCYTE #: 0.6 x10ˆ3/uL (ref 0.20–0.90)
MONOCYTE %: 12 %
MPV: 8.4 fL (ref 6.6–10.2)
NEUTROPHIL #: 2.7 x10ˆ3/uL (ref 1.50–6.40)
NEUTROPHIL %: 50 %
PLATELETS: 184 x10ˆ3/uL (ref 140–440)
RBC: 4.52 x10ˆ6/uL (ref 3.80–5.24)
RDW: 15.7 % — ABNORMAL HIGH (ref 12.4–15.2)
WBC: 5.5 x10ˆ3/uL (ref 3.5–10.3)

## 2018-03-24 LAB — BASIC METABOLIC PANEL
ANION GAP: 7 mmol/L
ANION GAP: 7 mmol/L
BUN/CREA RATIO: 10
BUN: 7 mg/dL — ABNORMAL LOW (ref 10–25)
CALCIUM: 7.3 mg/dL — ABNORMAL LOW (ref 8.8–10.3)
CHLORIDE: 111 mmol/L (ref 98–111)
CO2 TOTAL: 22 mmol/L (ref 21–35)
CREATININE: 0.68 mg/dL (ref <=1.30)
ESTIMATED GFR: >60 mL/min/1.73mˆ2
GLUCOSE: 75 mg/dL (ref 70–110)
POTASSIUM: 4 mmol/L (ref 3.5–5.0)
SODIUM: 140 mmol/L (ref 135–145)

## 2018-03-24 LAB — PHOSPHORUS: PHOSPHORUS: 1.8 mg/dL — ABNORMAL LOW (ref 2.5–4.5)

## 2018-03-24 LAB — MAGNESIUM: MAGNESIUM: 2.2 mg/dL (ref 1.8–2.3)

## 2018-03-24 MED ORDER — DIATRIZOATE MEGLUMINE-DIATRIZOATE SODIUM 66 %-10 % ORAL SOLUTION
30.00 mL | ORAL | Status: AC
Start: 2018-03-24 — End: 2018-03-24
  Administered 2018-03-24: 30 mL via ORAL

## 2018-03-24 MED ORDER — ONDANSETRON HCL (PF) 4 MG/2 ML INJECTION SOLUTION
4.0000 mg | Freq: Once | INTRAMUSCULAR | Status: AC
Start: 2018-03-24 — End: 2018-03-24
  Administered 2018-03-24: 4 mg via INTRAVENOUS

## 2018-03-24 MED ORDER — MORPHINE 2 MG/ML INTRAVENOUS SYRINGE
2.0000 mg | INJECTION | Freq: Once | INTRAVENOUS | Status: AC
Start: 2018-03-24 — End: 2018-03-24
  Administered 2018-03-24: 2 mg via INTRAVENOUS

## 2018-03-24 MED ADMIN — lanolin-oxyquin-pet, hydrophil topical ointment: INTRAVENOUS | @ 21:00:00 | NDC 09999989257

## 2018-03-24 MED ADMIN — lactated Ringers intravenous solution: INTRAVENOUS | @ 21:00:00 | NDC 00264775000

## 2018-03-24 NOTE — Progress Notes (Signed)
[facility]  Surgery Progress Note    Kelsey [name], Kelsey [name], 67 y.o. female  Date of Service: 03/24/2018  Date of Birth:  [birth year]    Hospital Day:  LOS: 2 days     Procedures:   s/p     Subjective/Overnight Events:  Patient complaining of mild abdominal pain.  Has had bowel movements and Gastrografin enema showing no evidence of colonic obstruction.  She has been tolerating clear liquid diet with no nausea vomiting.    Vital Signs:  Temperature: 36.4 C (97.5 F)  Heart Rate: 67  BP (Non-Invasive): (!) 141/66  Respiratory Rate: 16  SpO2: 96 %  Pain Score (Numeric, Faces): 9    Objective:  General: appears in good health  Lungs: Lung sounds clear to auscultation bilaterally.  Normal respiratory effort.  No wheezes, rales or rhonchi.    Heart: Regular rate and rhythm  Abdomen:  Obese abdominal with multiple surgical scars.  Supraumbilical area with possible seroma or ventral hernia.      Intake/Output Summary (Last 24 hours) at 03/24/2018 1455  Last data filed at 03/24/2018 1343  Gross per 24 hour   Intake 2120 ml   Output --   Net 2120 ml       Labs:  CBC Results BMP Results   Recent Labs     03/22/18  0510 03/23/18  0714 03/24/18  0726   WBC 9.1 5.4 5.5   HGB 12.9 12.1 11.9   HCT 42.2 36.9 36.8   PLTCNT 220 184 184    Recent Labs     03/22/18  0510 03/23/18  0714 03/24/18  0726   SODIUM 140 140 140   POTASSIUM 3.6 3.9 4.0   CHLORIDE 106 108 111   CO2 26 27 22    BUN 15 11 7*   CREATININE 0.87 0.78 0.68   GFR >60 >60 >60   ANIONGAP 8 5 7       Cardiac Results   Other Chemistries Results   No results for input(s): CKMB, MBINDEX, BNP in the last 72 hours.    Invalid input(s): UHCEASTROPI Recent Labs     03/22/18  0510 03/23/18  0714 03/24/18  0726   CALCIUM 7.4* 7.0* 7.3*   ALBUMIN 3.5  --   --    MAGNESIUM 2.8* 2.5* 2.2   PHOSPHORUS 2.1* 2.0* 1.8*      Liver/Pancreas Enzyme Results Coagulation Studies   Recent Labs     03/22/18  0510   TOTALPROTEIN 5.5*   ALBUMIN 3.5   AST 30   ALT 35   ALKPHOS 62   LDH 166    Recent  Labs     03/22/18  0510 03/23/18  0714   INR 1.15* 1.12*        Microbiology/Pathology: No results found for any visits on 03/22/18 (from the past 96 hour(s)).  No results found for this visit on 03/22/18.    Radiology:    Results for orders placed or performed during the hospital encounter of 03/22/18 (from the past 24 hour(s))   FLUORO BARIUM ENEMA AIR CONTRAST     Status: None    Narrative    Jaima Saksa    PROCEDURE DESCRIPTION:  FLUORO BARIUM ENEMA AIR CONTRAST    PROCEDURE PERFORMED DATE :  03/24/2018 11:13 AM    FLUORO TIME:  2.1 minutes    CLINICAL INDICATION:  Evaluate anastomosis status post sigmoid colon  resection.  Possible stricture    COMPARISON:  No prior  FINDINGS:  Scout KUB exam reveals unremarkable bowel gas pattern. There is  moderate to large scattered stool.    A single contrast Gastrografin was performed to level of rectum to the  cecum. No reflux into the terminal ileum nor visualization of the appendix  was demonstrated. There is moderate stool throughout the large bowel. The  large bowel otherwise shows normal contour and caliber.      Impression    Moderate to large stool. Otherwise unremarkable single contrast  Gastrografin enema.    Fluoroscopy time 2 minutes/6 images        Radiologist location ID: [id number]         Assessment/ Plan:  Active Hospital Problems    Diagnosis   . Primary Problem: Colonic obstruction (CMS HCC)   . Abdominal pain   . Right lower lobe lung mass   . COPD (chronic obstructive pulmonary disease) (CMS HCC)   . HTN (hypertension)   . Obesity      Patient with sigmoid cancer status post resection.  Now with new lung mass.  Has alternating constipation and diarrhea.  Gastrografin enema with no evidence of anastomotic stricture.  1. Out of bed and ambulate.  2. Advance diet as tolerated and will need bowel regimen to prevent constipation.      Karl Luke, MD 03/24/2018, 14:55

## 2018-03-24 NOTE — Consults (Addendum)
[facility]  Follow-up note    Kelsey, [name]  Date of Admission:  03/22/2018  Date of Service: 03/24/2018  Date of Birth:  1951-05-06    Requesting MD: Elana Alm      Assessment/Plan:  67 y.o.  Female with  Stage IIIB, pT3, TN1a, MX, adenocarcinoma of the sigmoid colon,   Status post 5 cycles of chemotherapy with FOLFOX ( was unable to complete 12 cycle of chemotherapy),  Lost follow-up with medical oncology/surgical oncology now admitted to hospital with concern for bowel obstruction.    1.  Constipation/concern for bowel obstruction:  -   CT abdomen completed at outside facility.  -   Surgery consulted  -   Management as per surgery in terms of bowel obstruction    2.  Lung lesion:  Found on CT at outside facility (  Unable to access those images)  - patient would like to get biopsy of lung lesion.  - will arrange biopsy once constipation/abdominal issue resolves.        Subjective:  No new issues overnight.  Continued to have abdominal pain.  Did not had any bowel movements as per patient.  Denies any fevers or chills.  Patient now wants to get lung lesion biopsy.  Still does not want any chemotherapy if diagnosed with cancer.    REVIEW OF SYSTEMS  Other than ROS in the HPI, all other systems were negative.    Past Medical History:   Diagnosis Date   . Arthropathy, unspecified, site unspecified    . Asthma    . Cancer (CMS HCC)     cervical   . Cataract    . Colon cancer (CMS [place]) 07/06/2014   . COPD (chronic obstructive pulmonary disease) (CMS HCC)    . Fistula    . HTN (hypertension)    . Hyperlipidemia    . Obesity    . Sleep apnea    . Ventral hernia          Past Surgical History:   Procedure Laterality Date   . BOWEL RESECTION     . CATARACT EXTRACTION     . HX CERVICAL CONE BIOPSY      . HX CESAREAN SECTION     . HX GASTRIC BYPASS      25 years ago   . [place] (x2)   . HX HYSTERECTOMY      late 1990s   . HX LAP BANDING      2013   . HX  LAPAROTOMY      2013 to remove lap band         Medications Prior to Admission     Prescriptions    atorvastatin (LIPITOR) 10 mg Oral Tablet    Take 10 mg by mouth Once a day    budesonide-formoterol (SYMBICORT) 160-4.5 mcg/actuation Inhalation HFA Aerosol Inhaler    Take 2 Puffs by inhalation Twice daily    CARVEDILOL ORAL    Take by mouth Every evening     docusate sodium (COLACE) 100 mg Oral Capsule    Take 100 mg by mouth Three times a day    magnesium oxide 200 mg magnesium Oral Tablet    Take 400 mg by mouth Once a day    omeprazole (PRILOSEC) 20 mg Oral Capsule, Delayed Release(E.C.)    Take 20 mg by mouth Once a day    potassium chloride (K-DUR) 20 mEq  Oral Tab Sust.Rel. Particle/Crystal    Take 20 mEq by mouth Once a day    prochlorperazine (COMPAZINE) 10 mg Oral Tablet    Take 1 Tab (10 mg total) by mouth Four times a day as needed for nausea/vomiting    traZODone (DESYREL) 100 mg Oral Tablet    Take 100 mg by mouth Every night    VITAMIN B COMPLEX (SUPER B COMPLEX-B-12 ORAL)    Take by mouth    vitamin E (AQUASOL E) 400 unit Oral Capsule    Take 400 Units by mouth Once a day          Current Facility-Administered Medications:  albuterol (PROVENTIL) 2.5 mg / 3 mL (0.083%) neb solution 2.5 mg Nebulization Q4H PRN   budesonide-formoterol (SYMBICORT) 160 mcg-4.5 mcg per inhalation oral inhaler 2 Puff Inhalation 2x/day   enoxaparin PF (LOVENOX) 40 mg/0.4 mL SubQ injection 40 mg Subcutaneous Q24H   morphine 2 mg/mL injection 2 mg Intravenous Q4H PRN   NS flush syringe 3 mL Intracatheter Q8HRS   NS flush syringe 3 mL Intracatheter Q1H PRN   NS premix infusion  Intravenous Continuous   ondansetron (ZOFRAN) 2 mg/mL injection 4 mg Intravenous Q4H PRN   pantoprazole (PROTONIX) injection 40 mg Intravenous Daily     Allergies   Allergen Reactions   . Shellfish Containing Products Shortness of Breath, Rash and Itching     scallops   . Vancomycin Shortness of Breath   . Iodine And Iodide Containing Products       Itching, shortness of breath      Family History  Family Medical History:     Problem Relation (Age of Onset)    Diabetes Mother, Maternal Grandmother, Maternal Grandfather, Father    Heart Attack Mother    Stroke Father          Social History  Social History     Socioeconomic History   . Marital status: Married     Spouse name: Not on file   . Number of children: Not on file   . Years of education: Not on file   . Highest education level: Not on file   Occupational History   . Not on file   Social Needs   . Financial resource strain: Not on file   . Food insecurity:     Worry: Not on file     Inability: Not on file   . Transportation needs:     Medical: Not on file     Non-medical: Not on file   Tobacco Use   . Smoking status: Former Research scientist (life sciences)   . Smokeless tobacco: Never Used   . Tobacco comment: quit 4 yrs ago   Substance and Sexual Activity   . Alcohol use: Yes     Comment: rarely   . Drug use: No   . Sexual activity: Not on file   Lifestyle   . Physical activity:     Days per week: Not on file     Minutes per session: Not on file   . Stress: Not on file   Relationships   . Social connections:     Talks on phone: Not on file     Gets together: Not on file     Attends religious service: Not on file     Active member of club or organization: Not on file     Attends meetings of clubs or organizations: Not on file     Relationship status: Not on  file   . Intimate partner violence:     Fear of current or ex partner: Not on file     Emotionally abused: Not on file     Physically abused: Not on file     Forced sexual activity: Not on file   Other Topics Concern   . Ability to Walk 1 Flight of Steps without SOB/CP Not Asked   . Routine Exercise Not Asked   . Ability to Walk 2 Flight of Steps without SOB/CP Not Asked   . Unable to Ambulate Not Asked   . Total Care Not Asked   . Ability To Do Own ADL's Not Asked   . Uses Walker Not Asked   . Other Activity Level Not Asked   . Uses Cane Not Asked   Social History Narrative      . Not on file       Objective     EXAM  VITALS:    Temperature: 36.7 C (98.1 F)  Heart Rate: 74  BP (Non-Invasive): (!) 161/62  Respiratory Rate: 18  SpO2: 95 %  Pain Score (Numeric, Faces): 8  Constitutional:  appears chronically ill  Respiratory:  Clear to auscultation bilaterally.   Cardiovascular:  regular rate and rhythm  Gastrointestinal:  Soft, non-tender  Neurologic:  Grossly normal  Lymphatic/Immunologic/Hematologic:  No lymphadenopathy    IMAGES:  N/A    LABS:    Lab Results Today:    Results for orders placed or performed during the hospital encounter of 03/22/18 (from the past 24 hour(s))   BASIC METABOLIC PANEL   Result Value Ref Range    SODIUM 140 135 - 145 mmol/L    POTASSIUM 4.0 3.5 - 5.0 mmol/L    CHLORIDE 111 98 - 111 mmol/L    CO2 TOTAL 22 21 - 35 mmol/L    ANION GAP 7 mmol/L    CALCIUM 7.3 (L) 8.8 - 10.3 mg/dL    GLUCOSE 75 70 - 110 mg/dL    BUN 7 (L) 10 - 25 mg/dL    CREATININE 0.68 <=1.30 mg/dL    BUN/CREA RATIO 10     ESTIMATED GFR >60 Avg: 85 mL/min/1.11m^2   MAGNESIUM   Result Value Ref Range    MAGNESIUM 2.2 1.8 - 2.3 mg/dL   PHOSPHORUS   Result Value Ref Range    PHOSPHORUS 1.8 (L) 2.5 - 4.5 mg/dL   CBC WITH DIFF   Result Value Ref Range    WBC 5.5 3.5 - 10.3 x10^3/uL    RBC 4.52 3.80 - 5.24 x10^6/uL    HGB 11.9 11.8 - 15.8 g/dL    HCT 36.8 34.6 - 46.2 %    MCV 81.4 (L) 82.3 - 96.7 fL    MCH 26.2 (L) 27.6 - 33.2 pg    MCHC 32.2 (L) 32.6 - 35.4 g/dL    RDW 15.7 (H) 12.4 - 15.2 %    PLATELETS 184 140 - 440 x10^3/uL    MPV 8.4 6.6 - 10.2 fL    NEUTROPHIL % 50 %    LYMPHOCYTE % 32 %    MONOCYTE % 12 %    EOSINOPHIL % 6 %    BASOPHIL % 1 %    NEUTROPHIL # 2.70 1.50 - 6.40 x10^3/uL    LYMPHOCYTE # 1.70 0.90 - 3.40 x10^3/uL    MONOCYTE # 0.60 0.20 - 0.90 x10^3/uL    EOSINOPHIL # 0.30 0.00 - 0.50 x10^3/uL    BASOPHIL # 0.00 0.00 - 0.20 x10^3/uL  Vanetta Mulders, MD

## 2018-03-24 NOTE — Care Plan (Signed)
Problem: Adult Inpatient Plan of Care  Goal: Plan of Care Review  Outcome: Ongoing (see interventions/notes)  Goal: Patient-Specific Goal (Individualization)  Outcome: Ongoing (see interventions/notes)  Goal: Absence of Hospital-Acquired Illness or Injury  Outcome: Ongoing (see interventions/notes)  Goal: Optimal Comfort and Wellbeing  Outcome: Ongoing (see interventions/notes)  Goal: Rounds/Family Conference  Outcome: Ongoing (see interventions/notes)     Problem: Pain Acute  Goal: Optimal Pain Control  Outcome: Ongoing (see interventions/notes)     Problem: Constipation  Goal: Effective Bowel Elimination  Outcome: Ongoing (see interventions/notes)     Problem: Fluid Deficit (Intestinal Obstruction)  Goal: Fluid Balance  Outcome: Ongoing (see interventions/notes)     Problem: Infection (Intestinal Obstruction)  Goal: Absence of Infection Signs/Symptoms  Outcome: Ongoing (see interventions/notes)     Problem: Nausea and Vomiting (Intestinal Obstruction)  Goal: Nausea and Vomiting Relief  Outcome: Ongoing (see interventions/notes)     Problem: Pain (Intestinal Obstruction)  Goal: Acceptable Pain Control  Outcome: Ongoing (see interventions/notes)     Problem: Fall Injury Risk  Goal: Absence of Fall and Fall-Related Injury  Outcome: Ongoing (see interventions/notes)

## 2018-03-24 NOTE — Care Plan (Signed)
BP (Non-Invasive): (!) 161/62 Temperature: 36.7 C (98.1 F) Heart Rate: 74 Respiratory Rate: 18 SpO2: 95 %  Height: 157.5 cm (5\' 2" ) Weight: (!) 139.1 kg (306 lb 10.6 oz) Body mass index is 56.09 kg/m.      Patient had a barium enema today; patient reported increased abdominal pain after procedure, with what she described as an explosion, meaning she moved her bowels after the procedure;  Patient was medicated with an additional dose of pain medications per orders; Also reported nausea and was medicated per orders;  Remains out of bed in chair, patient states she cant sleep in that horrible bed; denies any further needs; will monitor.   Koralee Wedeking Remi Deter, RN

## 2018-03-24 NOTE — Progress Notes (Signed)
Hospitalist Progress Note    Kelsey [name]  Date of service: 03/24/2018  Date of Admission:  03/22/2018  Hospital Day:  LOS: 2 days     Subjective: came from radiology and reports having abdominal pain. Breathing ok.    Vital Signs:  Temp (24hrs) Max:36.8 C (40.1 F)      Systolic (02VOZ), DGU:440 , Min:141 , HKV:425     Diastolic (95GLO), VFI:43, Min:54, Max:66    Temp  Avg: 36.7 C (98 F)  Min: 36.4 C (97.5 F)  Max: 36.8 C (98.2 F)  Pulse  Avg: 68  Min: 67  Max: 70  Resp  Avg: 17.5  Min: 16  Max: 18  SpO2  Avg: 97.8 %  Min: 96 %  Max: 99 %  Pain Score (Numeric, Faces): 9    I/O:  I/O last 24 hours:      Intake/Output Summary (Last 24 hours) at 03/24/2018 1235  Last data filed at 03/24/2018 0200  Gross per 24 hour   Intake 2000 ml   Output --   Net 2000 ml     I/O current shift:  No intake/output data recorded.        Current Facility-Administered Medications:  albuterol (PROVENTIL) 2.5 mg / 3 mL (0.083%) neb solution 2.5 mg Nebulization Q4H PRN   budesonide-formoterol (SYMBICORT) 160 mcg-4.5 mcg per inhalation oral inhaler 2 Puff Inhalation 2x/day   enoxaparin PF (LOVENOX) 40 mg/0.4 mL SubQ injection 40 mg Subcutaneous Q24H   morphine 2 mg/mL injection 2 mg Intravenous Q4H PRN   morphine 2 mg/mL injection 2 mg Intravenous Once   NS flush syringe 3 mL Intracatheter Q8HRS   NS flush syringe 3 mL Intracatheter Q1H PRN   NS premix infusion  Intravenous Continuous   ondansetron (ZOFRAN) 2 mg/mL injection 4 mg Intravenous Q4H PRN   ondansetron (ZOFRAN) 2 mg/mL injection 4 mg Intravenous Once   pantoprazole (PROTONIX) injection 40 mg Intravenous Daily       Physical Exam:  GENERAL:obese, no distress  CV: regular  HEENT: moist mucosa  ABD: obese, soft. NT  LUNGS: clear.    Labs:    CBC  (Last 24 hours)    Date/Time WBC HGB HCT MCV PLATELETS    03/24/18 0726 5.5    11.9    36.8    81.4 (L)    184           BMP  (Last 24 hours)    Date/Time Na K Cl CO2 BUN CREAT Calcium Glucose    03/24/18 0726 140    4.0    111    22    7  (L)    0.68    7.3 (L)    75           Recent Labs     03/24/18  0726   MAGNESIUM 2.2   PHOSPHORUS 1.8*     No results for input(s): INR, APTT in the last 24 hours.  No results found for: UHCEASTTROPI      Imaging:           Microbiology:  No results found for any visits on 03/22/18 (from the past 96 hour(s)).          Assessment/ Plan:   Colonic obstruction  abdominal pain  -mass versus stenosis of anastomosis  -Dr. Christel Mormon following.  - Being managed conservatively for now.    Right lower lobe lung mass  -concerning for possible metastasis  -consulted Oncology. Patient not  interested in chemotherapy.    COPD  -no signs of acute exacerbation, saturations stable on room air  -continue baseline inhalers  -albuterol nebulizers as needed for wheezing or shortness of breath    Hypertension  -currently stable, will review baseline medications and continue as appropriate      DVT/PE Prophylaxis: SCDs/ Venodynes/Impulse boots    Victory Dakin, MD8/5/201913:46  [place]

## 2018-03-24 NOTE — Care Plan (Signed)
END OF SHIFT NOTE;  Pt alert and oriented.  Medicated once for pain.  NPO.  Admitted for bowel obstruction.  No bowel movement for 8 days.  Abdomen distended.  Bowel sounds hypoactive.  Pt resting in chair.  Continue with plan of care.  For for gastrografin gi series this am.  Will continue with plan of care.  Continue to manage pain.  BP (!) 141/66   Pulse 68   Temp 36.8 C (98.2 F)   Resp 18   Ht 1.575 m (5\' 2" )   Wt (!) 139.1 kg (306 lb 10.6 oz)   SpO2 97%   BMI 56.09 kg/m   Raquel James, RN

## 2018-03-25 ENCOUNTER — Encounter (HOSPITAL_COMMUNITY): Payer: Self-pay

## 2018-03-25 ENCOUNTER — Inpatient Hospital Stay (HOSPITAL_COMMUNITY): Payer: Self-pay | Admitting: Radiology

## 2018-03-25 LAB — ECG 12 LEAD - ADULT
Calculated P Axis: INVALID deg
Calculated P Axis: 74 deg
Calculated T Axis: 212 deg
Calculated T Axis: 131 deg
EKG Severity: ABNORMAL
EKG Severity: ABNORMAL
Heart Rate: 177 {beats}/min
Heart Rate: 88 {beats}/min
I 40 Axis: 30 deg
I 40 Axis: 29 deg
PR Interval: INVALID ms
PR Interval: 134 ms
QRS Axis: 62 deg
QRS Axis: 51 deg
QRS Axis: 51 deg
QRS Duration: 77 ms
QRS Duration: 81 ms
QT Interval: 234 ms
QT Interval: 350 ms
QTC Calculation: 402 ms
QTC Calculation: 424 ms
ST Axis: 216 deg
ST Axis: 171 deg
T 40 Axis: 81 deg
T 40 Axis: 83 deg

## 2018-03-25 MED ORDER — DILTIAZEM 5 MG/ML INTRAVENOUS SOLUTION
0.2500 mg/kg | Freq: Once | INTRAVENOUS | Status: AC
Start: 2018-03-25 — End: 2018-03-25
  Administered 2018-03-25: 12.5 mg via INTRAVENOUS
  Filled 2018-03-25: qty 2.5

## 2018-03-25 MED ORDER — METOPROLOL TARTRATE 5 MG/5 ML INTRAVENOUS SOLUTION
5.00 mg | INTRAVENOUS | Status: DC | PRN
Start: 2018-03-25 — End: 2018-03-25
  Filled 2018-03-25: qty 5

## 2018-03-25 MED ORDER — LISINOPRIL 5 MG TABLET
5.0000 mg | ORAL_TABLET | Freq: Every day | ORAL | Status: DC
Start: 2018-03-25 — End: 2018-03-27
  Administered 2018-03-25 – 2018-03-27 (×3): 5 mg via ORAL
  Filled 2018-03-25 (×4): qty 1

## 2018-03-25 MED ORDER — SODIUM CHLORIDE 0.9 % INTRAVENOUS SOLUTION
5.00 mg/h | INTRAVENOUS | Status: DC
Start: 2018-03-25 — End: 2018-03-26
  Administered 2018-03-25: 5 mg/h via INTRAVENOUS
  Administered 2018-03-25: 10 mg/h via INTRAVENOUS
  Administered 2018-03-26: 0 mg/h via INTRAVENOUS
  Filled 2018-03-25: qty 25

## 2018-03-25 MED ORDER — CARVEDILOL 12.5 MG TABLET
18.75 mg | ORAL_TABLET | Freq: Two times a day (BID) | ORAL | Status: DC
Start: 2018-03-25 — End: 2018-03-27
  Administered 2018-03-25 – 2018-03-27 (×4): 18.75 mg via ORAL
  Filled 2018-03-25 (×5): qty 1
  Filled 2018-03-25: qty 2
  Filled 2018-03-25 (×2): qty 1

## 2018-03-25 MED ORDER — FLECAINIDE 50 MG TABLET
50.00 mg | ORAL_TABLET | Freq: Two times a day (BID) | ORAL | Status: DC
Start: 2018-03-25 — End: 2018-03-27
  Administered 2018-03-26 – 2018-03-27 (×3): 50 mg via ORAL
  Filled 2018-03-25 (×7): qty 1

## 2018-03-25 MED ADMIN — budesonide-formoterol HFA 160 mcg-4.5 mcg/actuation aerosol inhaler: RESPIRATORY_TRACT | @ 22:00:00

## 2018-03-25 MED ADMIN — atorvastatin 40 mg tablet: INTRAVENOUS | @ 05:00:00

## 2018-03-25 MED ADMIN — sodium chloride 0.9 % intravenous solution: ORAL | @ 17:00:00 | NDC 00338004904

## 2018-03-25 MED ADMIN — clotrimazole 1 % topical cream: INTRAVENOUS | @ 09:00:00 | NDC 45802043401

## 2018-03-25 NOTE — Nurses Notes (Addendum)
During afternoon vitals the PCA informed me that the patients BP and pulse were elevated.  I looked at the current vitals, spoke to the patient, found that there were cardiac medications not on her home list and not ordered.  [facility] in [place] was called and they faxed a current med list for the patient. Coreg, lisinopril and Eliquis was not on her home list.  I paged Dr Albertine Grates regarding BP, pulse and missing home meds.  He ordered a EKG, continuous telemetry monitoring and home meds.  Heart rate was 170-200's BPM.  Order to transfer to [place], Lucianne Lei gave IV cardizem and started cardizem drip at 5 mg per hour. Report call and patient transferred to [id number].   Tried to notify husband, one number is "not in service" the cell number is incorrect

## 2018-03-25 NOTE — Nurses Notes (Signed)
Patient refuses to go for small bowel follow through. First because she didn't "feel like it", then after several questions about the test she refused to go stating "I'm not swallowing any barium, I am allergic, it sets up like concrete". I informed patient that for her they were going to use gastrografin (contrast), she still refuses to go.   Paged Dr. Albertine Grates to update him also paging Dr. Laurena Bering.

## 2018-03-25 NOTE — Consults (Signed)
PATIENT NAME: [place] NUMBER:  [id number]  DATE OF SERVICE: 03/25/2018  DATE OF BIRTH:  13-Aug-1951    CONSULTATION    REQUESTING PHYSICIAN:  Victory Dakin    REASON FOR CONSULTATION:  Atrial fibrillation, rapid response.    HISTORY OF PRESENT ILLNESS:  This is a 67 year old female, admitted mainly for abdominal obstruction, admitted on March 22, 2018.  The patient was on the floor.  The patient has history of adenocarcinoma in the past and she had chemotherapy, but did not do well.  She had colon resection.  The patient was on the floor and she was not given her home medications, as she was n.p.o.  The patient does take flecainide and also Coreg, and she was not getting that.  The patient was fine this morning, she felt she was a little funny, but she cannot tell when the heart goes rapid.  The nurses did vitals on her, heart rate about 183.  EKG found to have atrial fibrillation, and the consult made.  When I saw, the patient was transferred here.  She was given Cardizem.  She is back in normal sinus rhythm.  The patient cannot tell that she has rapid heartbeat.  She says one time her heart was about 200 and she was not aware, and the doctor told that her that she has atrial fibrillation.  The patient also used to be on Eliquis.    HOME MEDICATIONS:  1. Coreg.  2. Eliquis.  3. Flecainide.   4. Lisinopril.   5. Protonix.    PAST MEDICAL HISTORY:  1. History of atrial fibrillation.  2. History of hypertension.  3. History of acid reflux disease.    REVIEW OF SYSTEM:  HEENT:  Showed no hearing problem.  Lungs:  History of sleep apnea, COPD, .  Cardiovascular:  History of atrial fibrillation.  No previous MI.  No history of any rheumatic valvular heart disorder.  Gastrointestinal: Colon cancer, history of colon resection.  She had chemotherapy.  She had a port.  Admitted for abdominal obstruction.  Central nervous system:  No history of any stroke.  Vascular:  No history of carotid disease,  abdominal aneurysm, peripheral artery disease.  Musculoskeletal:  History of arthritis.    SOCIAL HISTORY:  She denies smoking.    FAMILY HISTORY:  CAD.    PHYSICAL EXAMINATION:  General:  Elderly female, alert, awake, oriented x3.  Not in significant distress.  Vital signs:  Heart rate 80, blood pressure 130/40, respirations 20.  HEENT:  Showed no JVD.  Lungs:  Clear air entry.  No crackles.  Cardiovascular:  Heart sounds are regular.  She has a  systolic murmur in the apex.  Abdomen:  Soft, nontender.  Extremities:  No edema.  Central nervous system:  No obvious focal deficits.    IMPRESSION:  1. Atrial fibrillation, proximal. In the past, she was on Eliquis.  She was on flecainide.  She was not getting her medication.  She was also on Coreg.  She was not getting her medications.   2. The patient admitted for abdominal obstruction, now doing better.  She is taking liquids, not able to advance to full diet.   3. History of colon cancer.   4. History of hypertension.   5. History of acid reflux disease.    PLAN:  We will put her back on Coreg and also her flecainide.  Watch on the monitor.  Discontinue Cardizem.  Once  she gets her p.o. medications,  we will also put her back on Eliquis.  Once surgeon feels she is not going to have surgery.        Con Memos, MD                DD:  03/25/2018 18:15:06  DT:  03/25/2018 20:32:07 LB  D#:  [id number]

## 2018-03-25 NOTE — Nurses Notes (Signed)
Pt alert and oriented. Pt rested well throughout night. Pt refusing IV fluids this morning. Pt remains free from falls this shift, will continue to monitor. Elder Love, LPN

## 2018-03-25 NOTE — Progress Notes (Signed)
[facility]  Surgery Progress Note    Kelsey [name], Kelsey [name], 67 y.o. female  Date of Service: 03/25/2018  Date of Birth:  12-Nov-1950    Hospital Day:  LOS: 3 days     Procedures:   s/p     Subjective/Overnight Events:  Patient complaining of mild abdominal pain.  Has had bowel movements and has tolerated some liquids.  No acute events overnight.    Vital Signs:  Temperature: 36.8 C (98.2 F)  Heart Rate: 77  BP (Non-Invasive): 124/88  Respiratory Rate: 16  SpO2: 97 %  Pain Score (Numeric, Faces): 9(abdominal area)    Objective:  General: appears in good health  Lungs: Lung sounds clear to auscultation bilaterally.  Normal respiratory effort.  No wheezes, rales or rhonchi.    Heart: Regular rate and rhythm  Abdomen:  Obese abdominal with multiple surgical scars.  Supraumbilical area with possible seroma or ventral hernia.      Intake/Output Summary (Last 24 hours) at 03/25/2018 1347  Last data filed at 03/24/2018 1900  Gross per 24 hour   Intake 120 ml   Output --   Net 120 ml       Labs:  CBC Results BMP Results   Recent Labs     03/23/18  0714 03/24/18  0726   WBC 5.4 5.5   HGB 12.1 11.9   HCT 36.9 36.8   PLTCNT 184 184    Recent Labs     03/23/18  0714 03/24/18  0726   SODIUM 140 140   POTASSIUM 3.9 4.0   CHLORIDE 108 111   CO2 27 22   BUN 11 7*   CREATININE 0.78 0.68   GFR >60 >60   ANIONGAP 5 7      Cardiac Results   Other Chemistries Results   No results for input(s): CKMB, MBINDEX, BNP in the last 72 hours.    Invalid input(s): UHCEASTROPI Recent Labs     03/23/18  0714 03/24/18  0726   CALCIUM 7.0* 7.3*   MAGNESIUM 2.5* 2.2   PHOSPHORUS 2.0* 1.8*      Liver/Pancreas Enzyme Results Coagulation Studies   No results for input(s): TOTALPROTEIN, ALBUMIN, PREALBUMIN, AST, ALT, ALKPHOS, LDH, AMYLASE, LIPASE in the last 72 hours.    Invalid input(s): GGT Recent Labs     03/23/18  0714   INR 1.12*        Microbiology/Pathology: No results found for any visits on 03/22/18 (from the past 96 hour(s)).  No results found for  this visit on 03/22/18.    Radiology:         Assessment/ Plan:  Active Hospital Problems    Diagnosis   . Primary Problem: Colonic obstruction (CMS HCC)   . Abdominal pain   . Right lower lobe lung mass   . COPD (chronic obstructive pulmonary disease) (CMS HCC)   . HTN (hypertension)   . Obesity      Patient with sigmoid cancer status post resection.  Now with new lung mass.  Has alternating constipation and diarrhea.  Gastrografin enema with no evidence of anastomotic stricture.  1. Out of bed and ambulate.  2. Patient refused the small-bowel follow-through today.  Patient have full liquid diet and advance as tolerated.      Karl Luke, MD 03/25/2018, 13:47

## 2018-03-25 NOTE — Consults (Signed)
[facility]  Follow-up note    Kelsey, [name]  Date of Admission:  03/22/2018  Date of Service: 03/25/2018  Date of Birth:  07/04/1951    Requesting MD: Elana Alm      Assessment/Plan:  67 y.o.  Female with  Stage IIIB, pT3, TN1a, MX, adenocarcinoma of the sigmoid colon,   Status post 5 cycles of chemotherapy with FOLFOX ( was unable to complete 12 cycle of chemotherapy),  Lost follow-up with medical oncology/surgical oncology now admitted to hospital with concern for bowel obstruction.    1.  Constipation/concern for bowel obstruction:  -   CT abdomen completed at outside facility.  -   Surgery consulted  -   resolved.      2.  Lung lesion:  Found on CT at outside facility (  Unable to access those images)  - patient refuse any biopsy or repeat scan in the future.        Subjective:  No new issues overnight.  Patient had bowel movement and tolerated liquid diet.  Denies any fevers or chills.  Denies any worsening abdominal pain.    REVIEW OF SYSTEMS  Other than ROS in the HPI, all other systems were negative.    Past Medical History:   Diagnosis Date   . Arthropathy, unspecified, site unspecified    . Asthma    . Cancer (CMS HCC)     cervical   . Cataract    . Colon cancer (CMS [place]) 07/06/2014   . COPD (chronic obstructive pulmonary disease) (CMS HCC)    . Fistula    . HTN (hypertension)    . Hyperlipidemia    . Obesity    . Sleep apnea    . Ventral hernia          Past Surgical History:   Procedure Laterality Date   . BOWEL RESECTION     . CATARACT EXTRACTION     . HX CERVICAL CONE BIOPSY      . HX CESAREAN SECTION     . HX GASTRIC BYPASS      25 years ago   . [place] (x2)   . HX HYSTERECTOMY      late 1990s   . HX LAP BANDING      2013   . HX LAPAROTOMY      2013 to remove lap band         Medications Prior to Admission     Prescriptions    atorvastatin (LIPITOR) 10 mg Oral Tablet    Take 10 mg by mouth Once a day    budesonide-formoterol (SYMBICORT)  160-4.5 mcg/actuation Inhalation HFA Aerosol Inhaler    Take 2 Puffs by inhalation Twice daily    CARVEDILOL ORAL    Take by mouth Every evening     docusate sodium (COLACE) 100 mg Oral Capsule    Take 100 mg by mouth Three times a day    magnesium oxide 200 mg magnesium Oral Tablet    Take 400 mg by mouth Once a day    omeprazole (PRILOSEC) 20 mg Oral Capsule, Delayed Release(E.C.)    Take 20 mg by mouth Once a day    potassium chloride (K-DUR) 20 mEq Oral Tab Sust.Rel. Particle/Crystal    Take 20 mEq by mouth Once a day    prochlorperazine (COMPAZINE) 10 mg Oral Tablet    Take 1 Tab (10 mg total) by mouth  Four times a day as needed for nausea/vomiting    traZODone (DESYREL) 100 mg Oral Tablet    Take 100 mg by mouth Every night    VITAMIN B COMPLEX (SUPER B COMPLEX-B-12 ORAL)    Take by mouth    vitamin E (AQUASOL E) 400 unit Oral Capsule    Take 400 Units by mouth Once a day          Current Facility-Administered Medications:  albuterol (PROVENTIL) 2.5 mg / 3 mL (0.083%) neb solution 2.5 mg Nebulization Q4H PRN   budesonide-formoterol (SYMBICORT) 160 mcg-4.5 mcg per inhalation oral inhaler 2 Puff Inhalation 2x/day   carvedilol (COREG) tablet 18.75 mg 18.75 mg Oral 2x/day-Food   enoxaparin PF (LOVENOX) 40 mg/0.4 mL SubQ injection 40 mg Subcutaneous Q24H   lisinopril (PRINIVIL) tablet 5 mg Oral Daily   morphine 2 mg/mL injection 2 mg Intravenous Q4H PRN   NS flush syringe 3 mL Intracatheter Q8HRS   NS flush syringe 3 mL Intracatheter Q1H PRN   NS premix infusion  Intravenous Continuous   ondansetron (ZOFRAN) 2 mg/mL injection 4 mg Intravenous Q4H PRN   pantoprazole (PROTONIX) injection 40 mg Intravenous Daily     Allergies   Allergen Reactions   . Shellfish Containing Products Shortness of Breath, Rash and Itching     scallops   . Vancomycin Shortness of Breath   . Iodine And Iodide Containing Products      Itching, shortness of breath      Family History  Family Medical History:     Problem Relation (Age of Onset)     Diabetes Mother, Maternal Grandmother, Maternal Grandfather, Father    Heart Attack Mother    Stroke Father          Social History  Social History     Socioeconomic History   . Marital status: Married     Spouse name: Not on file   . Number of children: Not on file   . Years of education: Not on file   . Highest education level: Not on file   Occupational History   . Not on file   Social Needs   . Financial resource strain: Not on file   . Food insecurity:     Worry: Not on file     Inability: Not on file   . Transportation needs:     Medical: Not on file     Non-medical: Not on file   Tobacco Use   . Smoking status: Former Research scientist (life sciences)   . Smokeless tobacco: Never Used   . Tobacco comment: quit 4 yrs ago   Substance and Sexual Activity   . Alcohol use: Yes     Comment: rarely   . Drug use: No   . Sexual activity: Not on file   Lifestyle   . Physical activity:     Days per week: Not on file     Minutes per session: Not on file   . Stress: Not on file   Relationships   . Social connections:     Talks on phone: Not on file     Gets together: Not on file     Attends religious service: Not on file     Active member of club or organization: Not on file     Attends meetings of clubs or organizations: Not on file     Relationship status: Not on file   . Intimate partner violence:     Fear of current or  ex partner: Not on file     Emotionally abused: Not on file     Physically abused: Not on file     Forced sexual activity: Not on file   Other Topics Concern   . Ability to Walk 1 Flight of Steps without SOB/CP Not Asked   . Routine Exercise Not Asked   . Ability to Walk 2 Flight of Steps without SOB/CP Not Asked   . Unable to Ambulate Not Asked   . Total Care Not Asked   . Ability To Do Own ADL's Not Asked   . Uses Walker Not Asked   . Other Activity Level Not Asked   . Uses Cane Not Asked   Social History Narrative   . Not on file       Objective     EXAM  VITALS:    Temperature: 37.4 C (99.3 F)  Heart Rate: (!) 179  BP  (Non-Invasive): (!) 147/126  Respiratory Rate: 18  SpO2: 99 %  Pain Score (Numeric, Faces): 7  Constitutional:  appears chronically ill  Respiratory:  Clear to auscultation bilaterally.   Cardiovascular:  regular rate and rhythm  Gastrointestinal:  Soft, non-tender  Neurologic:  Grossly normal  Lymphatic/Immunologic/Hematologic:  No lymphadenopathy    IMAGES:  N/A    LABS:    Lab Results Today:    No results found for any visits on 03/22/18 (from the past 24 hour(s)).      Vanetta Mulders, MD

## 2018-03-25 NOTE — Progress Notes (Addendum)
Hospitalist Progress Note    Kelsey [name]  Date of service: 03/25/2018  Date of Admission:  03/22/2018  Hospital Day:  LOS: 3 days     Subjective: says abdominal pain better now. Has earlier though. Has BM,s. No fever,chills.    Vital Signs:  Temp (24hrs) Max:37 C (41.7 F)      Systolic (40CXK), GYJ:856 , Min:124 , DJS:970     Diastolic (26VZC), HYI:50, Min:53, Max:88    Temp  Avg: 36.8 C (98.3 F)  Min: 36.7 C (98.1 F)  Max: 37 C (98.6 F)  Pulse  Avg: 75.3  Min: 72  Max: 78  Resp  Avg: 17.8  Min: 16  Max: 19  SpO2  Avg: 96.5 %  Min: 95 %  Max: 97 %  Pain Score (Numeric, Faces): 9(abdominal area)    I/O:  I/O last 24 hours:      Intake/Output Summary (Last 24 hours) at 03/25/2018 1143  Last data filed at 03/24/2018 1900  Gross per 24 hour   Intake 240 ml   Output --   Net 240 ml     I/O current shift:  No intake/output data recorded.        Current Facility-Administered Medications:  albuterol (PROVENTIL) 2.5 mg / 3 mL (0.083%) neb solution 2.5 mg Nebulization Q4H PRN   budesonide-formoterol (SYMBICORT) 160 mcg-4.5 mcg per inhalation oral inhaler 2 Puff Inhalation 2x/day   enoxaparin PF (LOVENOX) 40 mg/0.4 mL SubQ injection 40 mg Subcutaneous Q24H   morphine 2 mg/mL injection 2 mg Intravenous Q4H PRN   NS flush syringe 3 mL Intracatheter Q8HRS   NS flush syringe 3 mL Intracatheter Q1H PRN   NS premix infusion  Intravenous Continuous   ondansetron (ZOFRAN) 2 mg/mL injection 4 mg Intravenous Q4H PRN   pantoprazole (PROTONIX) injection 40 mg Intravenous Daily       Physical Exam:  GENERAL:obese, no distress  CV: regular  HEENT: moist mucosa  ABD: obese, soft. NT  LUNGS: clear.    Labs:        No results for input(s): MAGNESIUM, PHOSPHORUS in the last 24 hours.  No results for input(s): INR, APTT in the last 24 hours.  No results found for: UHCEASTTROPI      Imaging:           Microbiology:  No results found for any visits on 03/22/18 (from the past 96 hour(s)).          Assessment/ Plan:   Colonic obstruction   abdominal pain  -mass versus stenosis of anastomosis  -Dr. Laurena Bering following.  - Further per surgery.    Right lower lobe lung mass  -concerning for possible metastasis  -consulted Oncology. Patient not interested in chemotherapy. Declining biopsy now.    COPD  -no signs of acute exacerbation, saturations stable on room air  -continue baseline inhalers  -albuterol nebulizers as needed for wheezing or shortness of breath    Hypertension  -currently stable.      DVT/PE Prophylaxis: SCDs/ Venodynes/Impulse boots    Victory Dakin, MD8/6/201913:46  [facility] - HOSPITALIST    Addendum.    Patient now reports that she has hx of afib and has been on coreg, flecainide,lisinopril and eliquis.  HR was up on manual check.  Seen and examined. No cp.  CVS irregular , tachy.  Plan.  Get EKG.  Telemetry  Resume coreg, lisinopril, flecainide  Iv metoprolol prn  Cardiology consult.  Hold eliquis for now in case she will  need any surgical procedure.  D/w nurse.    Addendum  HR is in 170,s  Start on cardizem bolus and drip and cardiology consult for further recs.

## 2018-03-25 NOTE — Consults (Signed)
Consult for atrial fibrillation with rapid ventricular response breath it appears she has this episode this morning breath patient did have AFib in the past also she used to be on flecainide, Coreg, Eliquis but she is not getting her home medication as she is NPO until yesterday    Admitted for abdominal obstruction getting better patient start taking liquid not a good advanced to full diet    Colon cancer status post chemotherapy she had a port    History of hypertension    Plan will put her back on Coreg, flecainide will stop IV Cardizem drip when she gets up p.o. Medication will give her Eliquis if surgeons are not going to do surgery

## 2018-03-25 NOTE — Care Plan (Signed)
Problem: Adult Inpatient Plan of Care  Goal: Plan of Care Review  Outcome: Ongoing (see interventions/notes)  Goal: Patient-Specific Goal (Individualization)  Outcome: Ongoing (see interventions/notes)  Goal: Absence of Hospital-Acquired Illness or Injury  Outcome: Ongoing (see interventions/notes)  Goal: Optimal Comfort and Wellbeing  Outcome: Ongoing (see interventions/notes)  Goal: Rounds/Family Conference  Outcome: Ongoing (see interventions/notes)     Problem: Pain Acute  Goal: Optimal Pain Control  Outcome: Ongoing (see interventions/notes)     Problem: Constipation  Goal: Effective Bowel Elimination  Outcome: Ongoing (see interventions/notes)     Problem: Fluid Deficit (Intestinal Obstruction)  Goal: Fluid Balance  Outcome: Ongoing (see interventions/notes)     Problem: Infection (Intestinal Obstruction)  Goal: Absence of Infection Signs/Symptoms  Outcome: Ongoing (see interventions/notes)     Problem: Nausea and Vomiting (Intestinal Obstruction)  Goal: Nausea and Vomiting Relief  Outcome: Ongoing (see interventions/notes)     Problem: Pain (Intestinal Obstruction)  Goal: Acceptable Pain Control  Outcome: Ongoing (see interventions/notes)     Problem: Fall Injury Risk  Goal: Absence of Fall and Fall-Related Injury  Outcome: Ongoing (see interventions/notes)       Patient alert and oriented. Transfer from [place]. Cardizem drip started prior to transfer. Cardizem titrated to 10 mg, converted to Sinus, EKG confirmed. BP stable with Cardizem @ 10 mg.  Patient requested I phone her daughter about transfer. She did not answer, Left message per her request. I will continue to monitor patient.BP 112/66   Pulse (!) 177   Temp 37.4 C (99.3 F)   Resp 18   Ht 1.575 m (5\' 2" )   Wt (!) 139.1 kg (306 lb 10.6 oz)   SpO2 99%   BMI 56.09 kg/m   Elizebeth Koller, RN  03/25/2018, 18:32

## 2018-03-26 ENCOUNTER — Encounter (HOSPITAL_COMMUNITY): Payer: Self-pay

## 2018-03-26 LAB — BASIC METABOLIC PANEL
ANION GAP: 4 mmol/L
BUN/CREA RATIO: 12
BUN: 10 mg/dL (ref 10–25)
CALCIUM: 7.8 mg/dL — ABNORMAL LOW (ref 8.8–10.3)
CHLORIDE: 106 mmol/L (ref 98–111)
CO2 TOTAL: 26 mmol/L (ref 21–35)
CREATININE: 0.81 mg/dL (ref <=1.30)
ESTIMATED GFR: >60 mL/min/{1.73_m2}
GLUCOSE: 120 mg/dL — ABNORMAL HIGH (ref 70–110)
POTASSIUM: 3.7 mmol/L (ref 3.5–5.0)
SODIUM: 136 mmol/L (ref 135–145)

## 2018-03-26 LAB — CBC WITH DIFF
BASOPHIL #: 0 10*3/uL (ref 0.00–0.20)
BASOPHIL %: 0 %
EOSINOPHIL #: 0.2 10*3/uL (ref 0.00–0.50)
EOSINOPHIL %: 3 %
EOSINOPHIL %: 3 %
HCT: 37.6 % (ref 34.6–46.2)
HGB: 12.1 g/dL (ref 11.8–15.8)
LYMPHOCYTE #: 2 10*3/uL (ref 0.90–3.40)
LYMPHOCYTE %: 25 %
MCH: 26.2 pg — ABNORMAL LOW (ref 27.6–33.2)
MCHC: 32.3 g/dL — ABNORMAL LOW (ref 32.6–35.4)
MCV: 81.1 fL — ABNORMAL LOW (ref 82.3–96.7)
MONOCYTE #: 0.9 10*3/uL (ref 0.20–0.90)
MONOCYTE %: 11 %
MPV: 8.8 fL (ref 6.6–10.2)
NEUTROPHIL #: 4.6 10*3/uL (ref 1.50–6.40)
NEUTROPHIL %: 60 %
NEUTROPHIL %: 60 %
PLATELETS: 224 10*3/uL (ref 140–440)
RBC: 4.64 10*6/uL (ref 3.80–5.24)
RDW: 15.3 % — ABNORMAL HIGH (ref 12.4–15.2)
WBC: 7.7 10*3/uL (ref 3.5–10.3)

## 2018-03-26 LAB — PHOSPHORUS: PHOSPHORUS: 1.9 mg/dL — ABNORMAL LOW (ref 2.5–4.5)

## 2018-03-26 LAB — MAGNESIUM: MAGNESIUM: 2.1 mg/dL (ref 1.8–2.3)

## 2018-03-26 MED ORDER — POTASSIUM, SODIUM PHOSPHATES 280 MG-160 MG-250 MG ORAL POWDER PACKET
1.00 | Freq: Four times a day (QID) | ORAL | Status: DC
Start: 2018-03-26 — End: 2018-03-27
  Administered 2018-03-26 (×4): 1 via ORAL
  Filled 2018-03-26 (×8): qty 1

## 2018-03-26 MED ORDER — APIXABAN 5 MG TABLET
5.0000 mg | ORAL_TABLET | Freq: Two times a day (BID) | ORAL | Status: DC
Start: 2018-03-26 — End: 2018-03-27
  Administered 2018-03-26 – 2018-03-27 (×3): 5 mg via ORAL
  Filled 2018-03-26 (×6): qty 1

## 2018-03-26 MED ORDER — PANTOPRAZOLE 40 MG TABLET,DELAYED RELEASE
40.00 mg | DELAYED_RELEASE_TABLET | Freq: Every morning | ORAL | Status: DC
Start: 2018-03-27 — End: 2018-03-27
  Administered 2018-03-27: 40 mg via ORAL
  Filled 2018-03-26 (×3): qty 1

## 2018-03-26 NOTE — Care Plan (Signed)
Patient stable with no complaints of pain or discomfort. Stopped Cardizem drip at 0020 and pt has remained in NSR with her heart rate below 100 ever since. Pt was questing whether or not she will get to go home later today or not.  Reviewed plan of care and answered any questions. Will continue to monitor pt and work toward discharge goals. No significant events.          Tennis Ship, RN  03/26/2018, 05:12          Problem: Adult Inpatient Plan of Care  Goal: Plan of Care Review  Outcome: Ongoing (see interventions/notes)  Goal: Patient-Specific Goal (Individualization)  Outcome: Ongoing (see interventions/notes)  Goal: Absence of Hospital-Acquired Illness or Injury  Outcome: Ongoing (see interventions/notes)  Intervention: Identify and Manage Fall Risk  Flowsheets (Taken 03/26/2018 0020)  Safety Promotion/Fall Prevention: safety round/check completed;nonskid shoes/slippers when out of bed;fall prevention program maintained  Intervention: Prevent VTE (venous thromboembolism)  Flowsheets (Taken 03/25/2018 2022)  VTE Prevention/Management: ambulation promoted  Goal: Optimal Comfort and Wellbeing  Outcome: Ongoing (see interventions/notes)  Intervention: Provide Person-Centered Care  Flowsheets (Taken 03/25/2018 2022)  Trust Relationship/Rapport: care explained;choices provided;questions answered;questions encouraged  Goal: Rounds/Family Conference  Outcome: Ongoing (see interventions/notes)     Problem: Pain Acute  Goal: Optimal Pain Control  Outcome: Ongoing (see interventions/notes)  Intervention: Develop Pain Management Plan  Flowsheets (Taken 03/26/2018 0508)  Sensory Stimulation Regulation: care clustered; lighting decreased; quiet environment promoted  Intervention: Prevent or Manage Pain  Flowsheets (Taken 03/26/2018 0508)  Sensory Stimulation Regulation: care clustered; lighting decreased; quiet environment promoted  Intervention: [place] (Taken 03/25/2018 2022)  Diversional  Activities: television  Supportive Measures: active listening utilized     Problem: Constipation  Goal: Effective Bowel Elimination  Outcome: Ongoing (see interventions/notes)     Problem: Fluid Deficit (Intestinal Obstruction)  Goal: Fluid Balance  Outcome: Ongoing (see interventions/notes)  Intervention: Monitor and Manage Hypovolemia  Flowsheets (Taken 03/25/2018 0740 by Andris Flurry, RN)  Fluid/Electrolyte Management: fluids provided     Problem: Infection (Intestinal Obstruction)  Goal: Absence of Infection Signs/Symptoms  Outcome: Ongoing (see interventions/notes)  Intervention: Prevent or Manage Infection  Flowsheets (Taken 03/24/2018 0916 by Peggye Form, RN)  Infection Management: aseptic technique maintained     Problem: Nausea and Vomiting (Intestinal Obstruction)  Goal: Nausea and Vomiting Relief  Outcome: Ongoing (see interventions/notes)  Intervention: Prevent and Manage Nausea and Vomiting  Flowsheets (Taken 03/23/2018 1950 by Raquel James, RN)  Nausea/Vomiting Interventions: nausea triggers minimized;stimuli minimized     Problem: Pain (Intestinal Obstruction)  Goal: Acceptable Pain Control  Outcome: Ongoing (see interventions/notes)  Intervention: Monitor and Manage Pain  Flowsheets (Taken 03/25/2018 2022)  Diversional Activities: television     Problem: Fall Injury Risk  Goal: Absence of Fall and Fall-Related Injury  Outcome: Ongoing (see interventions/notes)  Intervention: Identify and Manage Contributors to Fall Injury Risk  Flowsheets (Taken 03/25/2018 2022)  Self-Care Promotion: independence encouraged;BADL personal objects within reach;BADL personal routines maintained  Medication Review/Management: medications reviewed  Intervention: [place] 03/26/2018 0020  Safety Promotion/Fall Prevention: safety round/check completed;nonskid shoes/slippers when out of bed;fall prevention program maintained  Taken 03/25/2018 2022  Environmental Safety Modification:  assistive device/personal items within reach;clutter free environment maintained

## 2018-03-26 NOTE — Progress Notes (Signed)
[facility]  Surgery Progress Note    Kelsey [name], Kelsey [name], 67 y.o. female  Date of Service: 03/26/2018  Date of Birth:  February 21, 1951    Hospital Day:  LOS: 4 days     Procedures:   s/p     Subjective/Overnight Events:  Patient complaining of mild abdominal pain.  Has been tolerating a diet and having bowel movement.  Vital Signs:  Temperature: 36.8 C (98.2 F)  Heart Rate: 76  BP (Non-Invasive): (!) 117/53  Respiratory Rate: 16  SpO2: 98 %  Pain Score (Numeric, Faces): 5    Objective:  General: appears in good health  Lungs: Lung sounds clear to auscultation bilaterally.  Normal respiratory effort.  No wheezes, rales or rhonchi.    Heart: Regular rate and rhythm  Abdomen:  Obese abdominal with multiple surgical scars.  Supraumbilical area with possible seroma or ventral hernia.      Intake/Output Summary (Last 24 hours) at 03/26/2018 1310  Last data filed at 03/26/2018 0800  Gross per 24 hour   Intake 360 ml   Output --   Net 360 ml       Labs:  CBC Results BMP Results   Recent Labs     03/24/18  0726 03/26/18  0336   WBC 5.5 7.7   HGB 11.9 12.1   HCT 36.8 37.6   PLTCNT 184 224    Recent Labs     03/24/18  0726 03/26/18  0336   SODIUM 140 136   POTASSIUM 4.0 3.7   CHLORIDE 111 106   CO2 22 26   BUN 7* 10   CREATININE 0.68 0.81   GFR >60 >60   ANIONGAP 7 4      Cardiac Results   Other Chemistries Results   No results for input(s): CKMB, MBINDEX, BNP in the last 72 hours.    Invalid input(s): UHCEASTROPI Recent Labs     03/24/18  0726 03/26/18  0336   CALCIUM 7.3* 7.8*   MAGNESIUM 2.2 2.1   PHOSPHORUS 1.8* 1.9*      Liver/Pancreas Enzyme Results Coagulation Studies   No results for input(s): TOTALPROTEIN, ALBUMIN, PREALBUMIN, AST, ALT, ALKPHOS, LDH, AMYLASE, LIPASE in the last 72 hours.    Invalid input(s): GGT No results for input(s): INR, PROTHROMTME, APTT in the last 72 hours.     Microbiology/Pathology: No results found for any visits on 03/22/18 (from the past 96 hour(s)).  No results found for this visit on  03/22/18.    Radiology:         Assessment/ Plan:  Active Hospital Problems    Diagnosis   . Primary Problem: Colonic obstruction (CMS HCC)   . Abdominal pain   . Right lower lobe lung mass   . COPD (chronic obstructive pulmonary disease) (CMS HCC)   . HTN (hypertension)   . Obesity      Patient with sigmoid cancer status post resection.  Now with new lung mass.  Has alternating constipation and diarrhea.  Gastrografin enema with no evidence of anastomotic stricture.  1. Out of bed and ambulate.  2. Activity and diet as tolerated.    Karl Luke, MD 03/26/2018, 13:10

## 2018-03-26 NOTE — Care Plan (Signed)
Problem: Adult Inpatient Plan of Care  Goal: Plan of Care Review  Outcome: Ongoing (see interventions/notes)  Goal: Patient-Specific Goal (Individualization)  Outcome: Ongoing (see interventions/notes)  Goal: Absence of Hospital-Acquired Illness or Injury  Outcome: Ongoing (see interventions/notes)  Goal: Optimal Comfort and Wellbeing  Outcome: Ongoing (see interventions/notes)  Goal: Rounds/Family Conference  Outcome: Ongoing (see interventions/notes)     Problem: Pain Acute  Goal: Optimal Pain Control  Outcome: Ongoing (see interventions/notes)     Problem: Constipation  Goal: Effective Bowel Elimination  Outcome: Ongoing (see interventions/notes)     Problem: Fluid Deficit (Intestinal Obstruction)  Goal: Fluid Balance  Outcome: Ongoing (see interventions/notes)     Problem: Infection (Intestinal Obstruction)  Goal: Absence of Infection Signs/Symptoms  Outcome: Ongoing (see interventions/notes)     Problem: Nausea and Vomiting (Intestinal Obstruction)  Goal: Nausea and Vomiting Relief  Outcome: Ongoing (see interventions/notes)     Problem: Pain (Intestinal Obstruction)  Goal: Acceptable Pain Control  Outcome: Ongoing (see interventions/notes)     Problem: Fall Injury Risk  Goal: Absence of Fall and Fall-Related Injury  Outcome: Ongoing (see interventions/notes)       Patient alert and oriented. Independently walking around in room. Remains absent of falls. Patient husband is aware that she might get to go home tomorrow. Sinus 70's today. She did not request any pain medication. BP (!) 109/54   Pulse 75   Temp 36.7 C (98.1 F)   Resp 18   Ht 1.575 m (5\' 2" )   Wt (!) 139.1 kg (306 lb 10.6 oz)   SpO2 94%   BMI 56.09 kg/m   Elizebeth Koller, RN  03/26/2018, 18:19

## 2018-03-26 NOTE — Progress Notes (Signed)
Hospitalist Progress Note    Kelsey [name]  Date of service: 03/26/2018  Date of Admission:  03/22/2018  Hospital Day:  LOS: 4 days     Subjective:  Feels ok now. cardizem drip was stopped last night. No complaint.    Vital Signs:  Temp (24hrs) Max:37.4 C (32.4 F)      Systolic (40NUU), VOZ:366 , Min:99 , YQI:347     Diastolic (42VZD), GLO:75, Min:53, Max:126    Temp  Avg: 37.1 C (98.8 F)  Min: 36.8 C (98.2 F)  Max: 37.4 C (99.3 F)  Pulse  Avg: 120  Min: 68  Max: 181  Resp  Avg: 17.6  Min: 16  Max: 18  SpO2  Avg: 96.8 %  Min: 94 %  Max: 99 %  Pain Score (Numeric, Faces): 5    I/O:  I/O last 24 hours:      Intake/Output Summary (Last 24 hours) at 03/26/2018 1123  Last data filed at 03/26/2018 0800  Gross per 24 hour   Intake 360 ml   Output --   Net 360 ml     I/O current shift:  08/07 0700 - 08/07 1859  In: 360 [P.O.:360]  Out: -         Current Facility-Administered Medications:  albuterol (PROVENTIL) 2.5 mg / 3 mL (0.083%) neb solution 2.5 mg Nebulization Q4H PRN   apixaban (ELIQUIS) tablet 5 mg Oral 2x/day   budesonide-formoterol (SYMBICORT) 160 mcg-4.5 mcg per inhalation oral inhaler 2 Puff Inhalation 2x/day   carvedilol (COREG) tablet 18.75 mg 18.75 mg Oral 2x/day-Food   flecainide (TAMBORCOR) tablet 50 mg Oral 2x/day   lisinopril (PRINIVIL) tablet 5 mg Oral Daily   morphine 2 mg/mL injection 2 mg Intravenous Q4H PRN   NS flush syringe 3 mL Intracatheter Q8HRS   NS flush syringe 3 mL Intracatheter Q1H PRN   [START ON 03/27/2018] pantoprazole (PROTONIX) delayed release tablet 40 mg Oral Daily before Breakfast   potassium & sodium phosphate (PHOS-NA-K) 250-160-280mg  per oral packet 1 Packet Oral 4x/day-Food       Physical Exam:  GENERAL:obese, no distress  CV: regular  HEENT: moist mucosa  ABD: obese, soft. NT  LUNGS: clear.    Labs:    CBC  (Last 24 hours)    Date/Time WBC HGB HCT MCV PLATELETS    03/26/18 0336 7.7    12.1    37.6    81.1 (L)    224           BMP  (Last 24 hours)    Date/Time Na K Cl CO2 BUN  CREAT Calcium Glucose    03/26/18 0336 136    3.7    106    26    10    0.81    7.8 (L)    120 (H)           Recent Labs     03/26/18  0336   MAGNESIUM 2.1   PHOSPHORUS 1.9*     No results for input(s): INR, APTT in the last 24 hours.  No results found for: UHCEASTTROPI      Imaging:           Microbiology:  No results found for any visits on 03/22/18 (from the past 96 hour(s)).          Assessment/ Plan:   Colonic obstruction  abdominal pain  -mass versus stenosis of anastomosis  -Dr. Laurena Bering following.  - appears resolved. Tolerating diet.  Right lower lobe lung mass  -concerning for possible metastasis  -consulted Oncology. Patient not interested in chemotherapy. Declining biopsy now. D/w patient that if she will change her mind then she can talk to her PCP to schedule it.    COPD  -no signs of acute exacerbation, saturations stable on room air  -continue baseline inhalers  -albuterol nebulizers as needed for wheezing or shortness of breath    Hypertension  -currently stable.    PAF POA  - medications restarted including coreg and flecainide.  - on eliquis which is also started back as no surgical plan for now.  - appreciated cardiology assistance.      DVT/PE Prophylaxis: on eliquis.    Victory Dakin, MD8/7/201913:46  [facility] - HOSPITALIST

## 2018-03-26 NOTE — Nurses Notes (Addendum)
Received a call from Dr. Reece Levy, wanting to start back on Eliquis 5 mg bid, if okay with general surgery.    I paged Dr. Laurena Bering, awaiting a return call. Elizebeth Koller, RN  03/26/2018, 09:13        Received call back from Dr. Laurena Bering, okay to start back on Eliquis.Elizebeth Koller, RN  03/26/2018, 09:16        Spoke with Dr. Albertine Grates, okay to restart Eliquis, Order placed. Elizebeth Koller, RN  03/26/2018, 09:39

## 2018-03-26 NOTE — Consults (Signed)
[facility]  Cardiology   Progress Note      Kelsey [name], Kelsey [name]  Date of Admission:  03/22/2018  Date of service: 03/26/2018  Date of Birth:  [birth year]  MRN:  [id number]  Hospital Day:  LOS: 4 days     Subjective:     Patient is feeling much better breath patient anxious to go home.  Denies any chest pain, short of breath, PND, orthopnea, leg swelling spoke to the nurses breath patient Cardizem drip was stopped last    Objective:     Vital Signs:  Filed Vitals:    03/25/18 1900 03/25/18 2022 03/26/18 0020 03/26/18 0336   BP: (!) 99/59 (!) 99/54 (!) 109/57 (!) 119/57   Pulse:  84 75 68   Resp:  18  18   Temp:  37.2 C (99 F)  37 C (98.6 F)   SpO2:  94%  96%       Physical Exam:    General: Patient not in any acute distress.   HEENT: No JVD, No carotid bruit b/l  Lungs: Clear to auscultation and percussion.  Cardiovascular: Heart sounds are regular, systolic murmur.   Apex  Extremities: No edema, good peripheral pulses.  Neurology: Alert, awake and oriented x3     Telemetry reviewed:  Normal sinus rhythm 60-70    Labs:  No results found for: CHOLESTEROL, HDLCHOL, LDLCHOL, LDLCHOLDIR, TRIG  CBC Results   Recent Labs     03/23/18  0714 03/24/18  0726 03/26/18  0336   WBC 5.4 5.5 7.7   HGB 12.1 11.9 12.1   HCT 36.9 36.8 37.6   PLTCNT 184 184 224      BMP Results   Recent Labs     03/23/18  0714 03/24/18  0726 03/26/18  0336   SODIUM 140 140 136   POTASSIUM 3.9 4.0 3.7   CHLORIDE 108 111 106   CO2 27 22 26    BUN 11 7* 10   CREATININE 0.78 0.68 0.81   GFR >60 >60 >60   ANIONGAP 5 7 4      Recent Labs     03/23/18  0714 03/24/18  0726 03/26/18  0336   CALCIUM 7.0* 7.3* 7.8*   MAGNESIUM 2.5* 2.2 2.1      Coag Results   Recent Labs     03/23/18  0714   INR 1.12*      Cardiac Results      No results for input(s): UHCEASTTROPI, CKMB, MBINDEX, BNP in the last 72 hours.     Imaging:  FLUORO BARIUM ENEMA AIR CONTRAST   Final Result   Moderate to large stool. Otherwise unremarkable single contrast   Gastrografin enema.      Fluoroscopy time 2  minutes/6 images            Radiologist location ID: [id number]             Current Medications:    Current Facility-Administered Medications:  albuterol (PROVENTIL) 2.5 mg / 3 mL (0.083%) neb solution 2.5 mg Nebulization Q4H PRN   budesonide-formoterol (SYMBICORT) 160 mcg-4.5 mcg per inhalation oral inhaler 2 Puff Inhalation 2x/day   carvedilol (COREG) tablet 18.75 mg 18.75 mg Oral 2x/day-Food   dilTIAZem (CARDIZEM) 125 mg in NS 125 mL (tot vol) infusion 5 mg/hr Intravenous Continuous   enoxaparin PF (LOVENOX) 40 mg/0.4 mL SubQ injection 40 mg Subcutaneous Q24H   flecainide (TAMBORCOR) tablet 50 mg Oral 2x/day   lisinopril (PRINIVIL) tablet 5 mg Oral Daily  morphine 2 mg/mL injection 2 mg Intravenous Q4H PRN   NS flush syringe 3 mL Intracatheter Q8HRS   NS flush syringe 3 mL Intracatheter Q1H PRN   pantoprazole (PROTONIX) injection 40 mg Intravenous Daily     Allergies   Allergen Reactions   . Shellfish Containing Products Shortness of Breath, Rash and Itching     scallops   . Vancomycin Shortness of Breath   . Iodine And Iodide Containing Products      Itching, shortness of breath        Assessment/ Plan:    Atrial fibrillation, paroxysmal.  Patient back in normal rhythm she is off IV Cardizem drip  Plan stay on flecainide, ambulate hopefully home tomorrow will put her back on Eliquis if okay with surgeon.  Watch on the monitor on flecainide.  Home tomorrow    Colon cancer status post surgery admitted for obstruction  Patient tolerating diet    Hypertension, essential well controlled spoke about new guidelines  Plan stay on Coreg call if blood pressure running high or low    Riley Churches, MD

## 2018-03-26 NOTE — Consults (Signed)
[place]  Follow-up note    Kelsey [name], Kelsey [name]  Date of Admission:  03/22/2018  Date of Service: 03/26/2018  Date of Birth:  Sep 09, 1950    Requesting MD: Elana Alm      Assessment/Plan:  67 y.o.  Female with  Stage IIIB, pT3, TN1a, MX, adenocarcinoma of the sigmoid colon,   Status post 5 cycles of chemotherapy with FOLFOX ( was unable to complete 12 cycle of chemotherapy),  Lost follow-up with medical oncology/surgical oncology now admitted to hospital with concern for bowel obstruction.    1.  Constipation/concern for bowel obstruction:  -   CT abdomen completed at outside facility.  -   Surgery consulted  -   resolved.      2.  Lung lesion:  Found on CT at outside facility.  I had detailed discussion with patient regarding her lung nodules.  I strongly recommended biopsy which she refused.  Also offered repeat scan which she refused.  Patient understand the risk and benefits but does not want any further scan or biopsy at this point.  Patient understand that the lung lesion could be malignant and if untreated can progress and eventually will take her life.  Patient understand but does not want any biopsy or scan at this point.          Subjective:  No new issues overnight.  Patient had bowel movement and tolerated liquid diet.  Denies any fevers or chills.  Denies any worsening abdominal pain.    REVIEW OF SYSTEMS  Other than ROS in the HPI, all other systems were negative.    Past Medical History:   Diagnosis Date   . A-fib (CMS HCC)    . Arthropathy, unspecified, site unspecified    . Asthma    . Cancer (CMS HCC)     cervical   . Cataract    . Colon cancer (CMS [place]) 07/06/2014   . COPD (chronic obstructive pulmonary disease) (CMS HCC)    . Fistula    . HTN (hypertension)    . Hyperlipidemia    . Obesity    . Sleep apnea    . Ventral hernia          Past Surgical History:   Procedure Laterality Date   . BOWEL RESECTION     . CATARACT EXTRACTION     . HX CERVICAL CONE BIOPSY        . HX CESAREAN SECTION     . HX GASTRIC BYPASS      25 years ago   . [place] (x2)   . HX HYSTERECTOMY      late 1990s   . HX LAP BANDING      2013   . HX LAPAROTOMY      2013 to remove lap band         Medications Prior to Admission     Prescriptions    atorvastatin (LIPITOR) 10 mg Oral Tablet    Take 10 mg by mouth Once a day    budesonide-formoterol (SYMBICORT) 160-4.5 mcg/actuation Inhalation HFA Aerosol Inhaler    Take 2 Puffs by inhalation Twice daily    CARVEDILOL ORAL    Take by mouth Every evening     docusate sodium (COLACE) 100 mg Oral Capsule    Take 100 mg by mouth Three times a day    magnesium oxide 200 mg magnesium Oral Tablet    Take  400 mg by mouth Once a day    omeprazole (PRILOSEC) 20 mg Oral Capsule, Delayed Release(E.C.)    Take 20 mg by mouth Once a day    potassium chloride (K-DUR) 20 mEq Oral Tab Sust.Rel. Particle/Crystal    Take 20 mEq by mouth Once a day    prochlorperazine (COMPAZINE) 10 mg Oral Tablet    Take 1 Tab (10 mg total) by mouth Four times a day as needed for nausea/vomiting    traZODone (DESYREL) 100 mg Oral Tablet    Take 100 mg by mouth Every night    VITAMIN B COMPLEX (SUPER B COMPLEX-B-12 ORAL)    Take by mouth    vitamin E (AQUASOL E) 400 unit Oral Capsule    Take 400 Units by mouth Once a day          Current Facility-Administered Medications:  albuterol (PROVENTIL) 2.5 mg / 3 mL (0.083%) neb solution 2.5 mg Nebulization Q4H PRN   apixaban (ELIQUIS) tablet 5 mg Oral 2x/day   budesonide-formoterol (SYMBICORT) 160 mcg-4.5 mcg per inhalation oral inhaler 2 Puff Inhalation 2x/day   carvedilol (COREG) tablet 18.75 mg 18.75 mg Oral 2x/day-Food   flecainide (TAMBORCOR) tablet 50 mg Oral 2x/day   lisinopril (PRINIVIL) tablet 5 mg Oral Daily   morphine 2 mg/mL injection 2 mg Intravenous Q4H PRN   NS flush syringe 3 mL Intracatheter Q8HRS   NS flush syringe 3 mL Intracatheter Q1H PRN   [START ON 03/27/2018] pantoprazole (PROTONIX) delayed release tablet 40  mg Oral Daily before Breakfast   potassium & sodium phosphate (PHOS-NA-K) 250-160-280mg  per oral packet 1 Packet Oral 4x/day-Food     Allergies   Allergen Reactions   . Shellfish Containing Products Shortness of Breath, Rash and Itching     scallops   . Vancomycin Shortness of Breath   . Iodine And Iodide Containing Products      Itching, shortness of breath      Family History  Family Medical History:     Problem Relation (Age of Onset)    Diabetes Mother, Maternal Grandmother, Maternal Grandfather, Father    Heart Attack Mother    Stroke Father          Social History  Social History     Socioeconomic History   . Marital status: Married     Spouse name: Not on file   . Number of children: Not on file   . Years of education: Not on file   . Highest education level: Not on file   Occupational History   . Not on file   Social Needs   . Financial resource strain: Not on file   . Food insecurity:     Worry: Not on file     Inability: Not on file   . Transportation needs:     Medical: Not on file     Non-medical: Not on file   Tobacco Use   . Smoking status: Former Research scientist (life sciences)   . Smokeless tobacco: Never Used   . Tobacco comment: quit 4 yrs ago   Substance and Sexual Activity   . Alcohol use: Yes     Comment: rarely   . Drug use: No   . Sexual activity: Not on file   Lifestyle   . Physical activity:     Days per week: Not on file     Minutes per session: Not on file   . Stress: Not on file   Relationships   . Social connections:  Talks on phone: Not on file     Gets together: Not on file     Attends religious service: Not on file     Active member of club or organization: Not on file     Attends meetings of clubs or organizations: Not on file     Relationship status: Not on file   . Intimate partner violence:     Fear of current or ex partner: Not on file     Emotionally abused: Not on file     Physically abused: Not on file     Forced sexual activity: Not on file   Other Topics Concern   . Ability to Walk 1 Flight of  Steps without SOB/CP Not Asked   . Routine Exercise Not Asked   . Ability to Walk 2 Flight of Steps without SOB/CP Not Asked   . Unable to Ambulate Not Asked   . Total Care Not Asked   . Ability To Do Own ADL's Not Asked   . Uses Walker Not Asked   . Other Activity Level Not Asked   . Uses Cane Not Asked   Social History Narrative   . Not on file       Objective     EXAM  VITALS:    Temperature: 36.7 C (98.1 F)  Heart Rate: 75  BP (Non-Invasive): (!) 109/54  Respiratory Rate: 18  SpO2: 94 %  Pain Score (Numeric, Faces): 5  Constitutional:  appears chronically ill  Respiratory:  Clear to auscultation bilaterally.   Cardiovascular:  regular rate and rhythm  Gastrointestinal:  Soft, non-tender  Neurologic:  Grossly normal  Lymphatic/Immunologic/Hematologic:  No lymphadenopathy    IMAGES:  N/A    LABS:    Lab Results Today:    Results for orders placed or performed during the hospital encounter of 03/22/18 (from the past 24 hour(s))   ECG 12 LEAD - ADULT   Result Value Ref Range    Heart Rate 177 BPM    PR Interval Invalid ms    QRS Duration 77 ms    QT Interval 234 ms    QTC Calculation 402 ms    Calculated P Axis Invalid deg    QRS Axis 62 deg    Calculated T Axis 212 deg    I 40 Axis 30 deg    T 40 Axis 81 deg    ST Axis 216 deg    EKG Severity - ABNORMAL ECG -    ECG 12 LEAD - ADULT   Result Value Ref Range    Heart Rate 88 BPM    PR Interval 134 ms    QRS Duration 81 ms    QT Interval 350 ms    QTC Calculation 424 ms    Calculated P Axis 74 deg    QRS Axis 51 deg    Calculated T Axis 131 deg    I 40 Axis 29 deg    T 40 Axis 83 deg    ST Axis 171 deg    EKG Severity - ABNORMAL ECG -    BASIC METABOLIC PANEL   Result Value Ref Range    SODIUM 136 135 - 145 mmol/L    POTASSIUM 3.7 3.5 - 5.0 mmol/L    CHLORIDE 106 98 - 111 mmol/L    CO2 TOTAL 26 21 - 35 mmol/L    ANION GAP 4 mmol/L    CALCIUM 7.8 (L) 8.8 - 10.3 mg/dL    GLUCOSE  120 (H) 70 - 110 mg/dL    BUN 10 10 - 25 mg/dL    CREATININE 0.81 <=1.30 mg/dL    BUN/CREA  RATIO 12     ESTIMATED GFR >60 Avg: 85 mL/min/1.12m^2   MAGNESIUM   Result Value Ref Range    MAGNESIUM 2.1 1.8 - 2.3 mg/dL   PHOSPHORUS   Result Value Ref Range    PHOSPHORUS 1.9 (L) 2.5 - 4.5 mg/dL   CBC WITH DIFF   Result Value Ref Range    WBC 7.7 3.5 - 10.3 x10^3/uL    RBC 4.64 3.80 - 5.24 x10^6/uL    HGB 12.1 11.8 - 15.8 g/dL    HCT 37.6 34.6 - 46.2 %    MCV 81.1 (L) 82.3 - 96.7 fL    MCH 26.2 (L) 27.6 - 33.2 pg    MCHC 32.3 (L) 32.6 - 35.4 g/dL    RDW 15.3 (H) 12.4 - 15.2 %    PLATELETS 224 140 - 440 x10^3/uL    MPV 8.8 6.6 - 10.2 fL    NEUTROPHIL % 60 %    LYMPHOCYTE % 25 %    MONOCYTE % 11 %    EOSINOPHIL % 3 %    BASOPHIL % 0 %    NEUTROPHIL # 4.60 1.50 - 6.40 x10^3/uL    LYMPHOCYTE # 2.00 0.90 - 3.40 x10^3/uL    MONOCYTE # 0.90 0.20 - 0.90 x10^3/uL    EOSINOPHIL # 0.20 0.00 - 0.50 x10^3/uL    BASOPHIL # 0.00 0.00 - 0.20 x10^3/uL         Vanetta Mulders, MD

## 2018-03-27 LAB — BASIC METABOLIC PANEL
ANION GAP: 5 mmol/L
BUN/CREA RATIO: 16
BUN: 12 mg/dL (ref 10–25)
CALCIUM: 8 mg/dL — ABNORMAL LOW (ref 8.8–10.3)
CHLORIDE: 108 mmol/L (ref 98–111)
CO2 TOTAL: 27 mmol/L (ref 21–35)
CREATININE: 0.77 mg/dL (ref <=1.30)
ESTIMATED GFR: >60 mL/min/{1.73_m2}
GLUCOSE: 111 mg/dL — ABNORMAL HIGH (ref 70–110)
POTASSIUM: 3.8 mmol/L (ref 3.5–5.0)
SODIUM: 140 mmol/L (ref 135–145)

## 2018-03-27 LAB — CBC
HCT: 34.8 % (ref 34.6–46.2)
HGB: 11.5 g/dL — ABNORMAL LOW (ref 11.8–15.8)
MCH: 26.5 pg — ABNORMAL LOW (ref 27.6–33.2)
MCHC: 33 g/dL (ref 32.6–35.4)
MCV: 80.3 fL — ABNORMAL LOW (ref 82.3–96.7)
MPV: 8.5 fL (ref 6.6–10.2)
PLATELETS: 202 10*3/uL (ref 140–440)
RBC: 4.34 10*6/uL (ref 3.80–5.24)
RDW: 15.7 % — ABNORMAL HIGH (ref 12.4–15.2)
WBC: 7.5 10*3/uL (ref 3.5–10.3)

## 2018-03-27 LAB — PHOSPHORUS: PHOSPHORUS: 2.8 mg/dL (ref 2.5–4.5)

## 2018-03-27 MED ORDER — FLECAINIDE 50 MG TABLET
50.00 mg | ORAL_TABLET | Freq: Two times a day (BID) | ORAL | Status: DC
Start: 2018-03-27 — End: 2018-08-29

## 2018-03-27 MED ORDER — LISINOPRIL 5 MG TABLET
5.0000 mg | ORAL_TABLET | Freq: Every day | ORAL | Status: DC
Start: 2018-03-27 — End: 2018-08-07

## 2018-03-27 MED ORDER — APIXABAN 5 MG TABLET
5.00 mg | ORAL_TABLET | Freq: Two times a day (BID) | ORAL | Status: DC
Start: 2018-03-27 — End: 2021-01-12

## 2018-03-27 NOTE — Consults (Signed)
[facility]  Cardiology   Progress Note      Kelsey [name], Kelsey [name]  Date of Admission:  03/22/2018  Date of service: 03/27/2018  Date of Birth:  July 07, 1951  MRN:  [id number]  Hospital Day:  LOS: 5 days     Subjective:     Patient is feeling much better breath she is anxious to go home.  Spoke for a long time about her medications flecainide blood thinners.  Denies any chest pain, PND, orthopnea, leg swelling    Objective:     Vital Signs:  Filed Vitals:    03/26/18 0839 03/26/18 1500 03/26/18 2015 03/27/18 0405   BP: (!) 117/53 (!) 109/54 138/76 132/64   Pulse: 76 75 64 60   Resp: 16 18 18 18    Temp: 36.8 C (98.2 F) 36.7 C (98.1 F) 37 C (98.6 F) 36.7 C (98.1 F)   SpO2: 98% 94% 97% 98%       Physical Exam:    General: Patient not in any acute distress.   HEENT: No JVD, No carotid bruit b/l  Lungs: Clear to auscultation and percussion.  Cardiovascular: Heart sounds are regular, systolic murmur.  Apex  Extremities: No edema, good peripheral pulses.  Neurology: Alert, awake and oriented x3     Telemetry reviewed:  Normal rhythm 60 70    Labs:  No results found for: CHOLESTEROL, HDLCHOL, LDLCHOL, LDLCHOLDIR, TRIG  CBC Results   Recent Labs     03/24/18  0726 03/26/18  0336 03/27/18  0338   WBC 5.5 7.7 7.5   HGB 11.9 12.1 11.5*   HCT 36.8 37.6 34.8   PLTCNT 184 224 202      BMP Results   Recent Labs     03/24/18  0726 03/26/18  0336 03/27/18  0338   SODIUM 140 136 140   POTASSIUM 4.0 3.7 3.8   CHLORIDE 111 106 108   CO2 22 26 27    BUN 7* 10 12   CREATININE 0.68 0.81 0.77   GFR >60 >60 >60   ANIONGAP 7 4 5      Recent Labs     03/24/18  0726 03/26/18  0336 03/27/18  0338   CALCIUM 7.3* 7.8* 8.0*   MAGNESIUM 2.2 2.1  --       Coag Results   No results for input(s): INR, PROTHROMTME, APTT in the last 72 hours.    Invalid input(s): PTT, PT, CREACTPROTIE   Cardiac Results      No results for input(s): UHCEASTTROPI, CKMB, MBINDEX, BNP in the last 72 hours.     Imaging:  FLUORO BARIUM ENEMA AIR CONTRAST   Final Result   Moderate to large  stool. Otherwise unremarkable single contrast   Gastrografin enema.      Fluoroscopy time 2 minutes/6 images            Radiologist location ID: [id number]             Current Medications:    Current Facility-Administered Medications:  albuterol (PROVENTIL) 2.5 mg / 3 mL (0.083%) neb solution 2.5 mg Nebulization Q4H PRN   apixaban (ELIQUIS) tablet 5 mg Oral 2x/day   budesonide-formoterol (SYMBICORT) 160 mcg-4.5 mcg per inhalation oral inhaler 2 Puff Inhalation 2x/day   carvedilol (COREG) tablet 18.75 mg 18.75 mg Oral 2x/day-Food   flecainide (TAMBORCOR) tablet 50 mg Oral 2x/day   lisinopril (PRINIVIL) tablet 5 mg Oral Daily   morphine 2 mg/mL injection 2 mg Intravenous Q4H PRN   NS flush syringe  3 mL Intracatheter Q8HRS   NS flush syringe 3 mL Intracatheter Q1H PRN   pantoprazole (PROTONIX) delayed release tablet 40 mg Oral Daily before Breakfast   potassium & sodium phosphate (PHOS-NA-K) 250-160-280mg  per oral packet 1 Packet Oral 4x/day-Food     Allergies   Allergen Reactions   . Shellfish Containing Products Shortness of Breath, Rash and Itching     scallops   . Vancomycin Shortness of Breath   . Iodine And Iodide Containing Products      Itching, shortness of breath        Assessment/ Plan:    Atrial fibrillation, paroxysmal patient is back in normal sinus rhythm she is back on her home medications including Eliquis and flecainide patient anxious to go home  Plan ambulate can go home follow with me 1 week after discharge.  Home on flecainide and Eliquis    Colon cancer status post surgery she had obstruction doing much better she also got lung nodule a believe she refused workup  Plan as per family doctors and oncology doctor    Riley Churches, MD

## 2018-03-27 NOTE — Care Plan (Signed)
Patient stable with few complaints of pain or discomfort. Pain managed with medication. Patient slept throughout shift. Patient is excited for possible discharge today. Reviewed plan of care and answered any questions. Will continue to monitor pt and work toward discharge goals. No significant events.          Tennis Ship, RN  03/27/2018, 05:33        Problem: Adult Inpatient Plan of Care  Goal: Plan of Care Review  Outcome: Ongoing (see interventions/notes)  Goal: Patient-Specific Goal (Individualization)  Outcome: Ongoing (see interventions/notes)  Goal: Absence of Hospital-Acquired Illness or Injury  Outcome: Ongoing (see interventions/notes)  Intervention: Identify and Manage Fall Risk  Flowsheets (Taken 03/26/2018 2140 by Allayne Butcher, PCA)  Safety Promotion/Fall Prevention: safety round/check completed  Intervention: Prevent VTE (venous thromboembolism)  Flowsheets (Taken 03/26/2018 2140 by Cottrill, Mickie, PCA)  VTE Prevention/Management: ambulation promoted  Goal: Optimal Comfort and Wellbeing  Outcome: Ongoing (see interventions/notes)  Intervention: Provide Person-Centered Care  Flowsheets (Taken 03/26/2018 2015)  Trust Relationship/Rapport: care explained;choices provided;questions encouraged;questions answered  Goal: Rounds/Family Conference  Outcome: Ongoing (see interventions/notes)     Problem: Pain Acute  Goal: Optimal Pain Control  Outcome: Ongoing (see interventions/notes)  Intervention: Develop Pain Management Plan  Flowsheets  Taken 03/23/2018 2002 by Raquel James, RN  Pain Intervention : PRN Medication  Taken 03/26/2018 1600 by Elizebeth Koller, RN  Sensory Stimulation Regulation: care clustered  Taken 03/24/2018 2000 by Elder Love, LPN  Complementary Therapy: music therapy  Intervention: Prevent or Manage Pain  Flowsheets  Taken 03/26/2018 1600 by Elizebeth Koller, RN  Sensory Stimulation Regulation: care clustered  Taken 03/23/2018 1950 by Raquel James, RN  Sleep/Rest Enhancement: awakenings minimized;noise  level reduced  Taken 03/24/2018 2000 by Elder Love, LPN  Complementary Therapy: music therapy  Intervention: Optimize Psychosocial Wellbeing  Flowsheets  Taken 03/23/2018 1950 by Raquel James, RN  Spiritual Activities Assistance: affirmation provided  Taken 03/26/2018 2015 by Tennis Ship, RN  Diversional Activities: television  Supportive Measures: active listening utilized     Problem: Constipation  Goal: Effective Bowel Elimination  Outcome: Ongoing (see interventions/notes)  Intervention: Promote Effective Bowel Elimination  Flowsheets  Taken 03/23/2018 1950 by Raquel James, RN  Bowel Function Promotion: toileting schedule established  Taken 03/25/2018 0740 by Andris Flurry, RN  Bowel Dysfunction Management: toileting offered     Problem: Fluid Deficit (Intestinal Obstruction)  Goal: Fluid Balance  Outcome: Ongoing (see interventions/notes)  Intervention: Monitor and Manage Hypovolemia  Flowsheets (Taken 03/25/2018 0740 by Andris Flurry, RN)  Fluid/Electrolyte Management: fluids provided     Problem: Infection (Intestinal Obstruction)  Goal: Absence of Infection Signs/Symptoms  Outcome: Ongoing (see interventions/notes)  Intervention: Prevent or Manage Infection  Flowsheets  Taken 03/24/2018 2000 by Elder Love, LPN  Fever Reduction/Comfort Measures: lightweight bedding  Taken 03/24/2018 0916 by Peggye Form, RN  Infection Management: aseptic technique maintained     Problem: Nausea and Vomiting (Intestinal Obstruction)  Goal: Nausea and Vomiting Relief  Outcome: Ongoing (see interventions/notes)  Intervention: Prevent and Manage Nausea and Vomiting  Flowsheets  Taken 03/24/2018 2000 by Elder Love, LPN  [place]iration Precautions: awake/alert before oral intake  Taken 03/23/2018 1950 by Raquel James, RN  Nausea/Vomiting Interventions: nausea triggers minimized;stimuli minimized  Oral Care: oral care provided     Problem: Pain (Intestinal Obstruction)  Goal: Acceptable Pain Control  Outcome: Ongoing  (see interventions/notes)  Intervention: Monitor and Manage Pain  Flowsheets (Taken 03/26/2018 2015)  Diversional Activities: television     Problem: Fall Injury Risk  Goal: Absence of Fall and Fall-Related Injury  Outcome: Ongoing (see interventions/notes)  Intervention: Identify and Manage Contributors to Fall Injury Risk  Flowsheets (Taken 03/26/2018 2015)  Self-Care Promotion: independence encouraged;BADL personal objects within reach;BADL personal routines maintained  Medication Review/Management: medications reviewed  Intervention: [place] (Taken 03/26/2018 2140 by Allayne Butcher, PCA)  Safety Promotion/Fall Prevention: safety round/check completed  Environmental Safety Modification: assistive device/personal items within reach;clutter free environment maintained

## 2018-03-27 NOTE — Discharge Summary (Signed)
[place]      PATIENT NAME:  Kelsey [name], Kelsey [name]  MRN:  [id number]  DOB:  [birth year]      ENCOUNTER START DATE: 03/22/2018  INPATIENT ADMISSION DATE: 03/22/2018    DISCHARGE DATE:  03/27/2018    ATTENDING PHYSICIAN: Victory Dakin, MD  PRIMARY CARE PHYSICIAN: [facility]     ADMISSION DIAGNOSIS: Colonic obstruction (CMS [facility])  Chief Complaint   Patient presents with   . Pelvic Pain           DISCHARGE DIAGNOSIS: colonic obstruction. See below.  Hospital Problems) (* Primary Problem)    Diagnosis Date Noted   . Right lower lobe lung mass 03/22/2018   . COPD (chronic obstructive pulmonary disease) (CMS HCC)    . HTN (hypertension)    . Obesity       Resolved Hospital Problems    Diagnosis Date Noted Date Resolved   . *Colonic obstruction (CMS HCC) 03/22/2018 03/27/2018   . Abdominal pain 03/22/2018 03/27/2018     Active Non-Hospital Problems    Diagnosis Date Noted   . Colon cancer (CMS [place]) 07/06/2014   . Hyperlipidemia    . Arthropathy, unspecified, site unspecified    . Ventral hernia         DISCHARGE MEDICATIONS:     Current Discharge Medication List      START taking these medications.      Details   apixaban 5 mg Tablet  Commonly known as:  ELIQUIS   5 mg, Oral, 2 TIMES DAILY  Refills:  0     flecainide 50 mg Tablet  Commonly known as:  TAMBOCOR   50 mg, Oral, 2 TIMES DAILY  Refills:  0     lisinopril 5 mg Tablet  Commonly known as:  PRINIVIL   5 mg, Oral, DAILY  Refills:  0        CONTINUE these medications - NO CHANGES were made during your visit.      Details   atorvastatin 10 mg Tablet  Commonly known as:  LIPITOR   10 mg, DAILY  Refills:  0     budesonide-formoterol 160-4.5 mcg/actuation HFA Aerosol Inhaler  Commonly known as:  SYMBICORT   2 Puffs, Inhalation, 2 TIMES DAILY  Refills:  0     CARVEDILOL ORAL   Oral, EVERY EVENING  Refills:  0     docusate sodium 100 mg Capsule  Commonly known as:  COLACE   100 mg, Oral, 3 TIMES DAILY  Refills:  0     magnesium oxide 200  mg magnesium Tablet   400 mg, Oral, DAILY  Refills:  0     omeprazole 20 mg Capsule, Delayed Release(E.C.)  Commonly known as:  PRILOSEC   20 mg, Oral, DAILY  Refills:  0     potassium chloride 20 mEq Tab Sust.Rel. Particle/Crystal  Commonly known as:  K-DUR   20 mEq, Oral, DAILY  Refills:  0     prochlorperazine 10 mg Tablet  Commonly known as:  COMPAZINE   10 mg, Oral, 4 TIMES DAILY PRN  Qty:  60 Tab  Refills:  5     SUPER B COMPLEX-B-12 ORAL   Oral  Refills:  0     traZODone 100 mg Tablet  Commonly known as:  DESYREL   100 mg, Oral, NIGHTLY  Refills:  0     vitamin E 400 unit Capsule   400 Units, Oral, DAILY  Refills:  0  DISCHARGE INSTRUCTIONS:   No discharge procedures on file.  Follow-up [place] In 1 week.    Specialty:  EXTERNAL  Contact information:  [place] [address]  [phone]             Riley Churches, MD In 1 week.    Specialty:  CARDIOLOGY  Contact information:  [address]  [address]  [place] [address]  [phone]                   REASON FOR HOSPITALIZATION AND HOSPITAL COURSE:  This is a 67 y.o., female admitted for colonic obstruction. Refer to H&P for details.    COURSE IN HOSPITAL:   Colonic obstructionabdominal pain  -mass versus stenosis of anastomosis  -Dr. Laurena Bering following.  - appears resolved. Tolerating diet.    Right lower lobe lung mass  -concerning for possible metastasis  -consulted Oncology. Patient not interested in chemotherapy. Declining biopsy now. D/w patient that if she will change her mind then she can talk to her PCP to schedule it.    COPD  -no signs of acute exacerbation, saturations stable on room air  -continue baseline inhalers  -albuterol nebulizers as needed for wheezing or shortness of breath    Hypertension  -currently stable.    PAF POA  - medications restarted including coreg and flecainide.  - on eliquis which is also started back as no surgical plan for now.  -  appreciated cardiology assistance.      DVT/PE Prophylaxis:on eliquis.    DOES PATIENT HAVE ADVANCED DIRECTIVES:  No, Information Offered and Given    CONDITION ON DISCHARGE: Alert, Oriented and VS Stable, lungs clear, abdomen soft, cvs regular.    DISCHARGE DISPOSITION:  Home discharge     Copies sent to Care Team       Relationship Specialty Notifications [place], [place] PCP - General EXTERNAL  10/01/17     Phone: [phone] Fax: [phone] [address] [place] [phone]            Victory Dakin, MD

## 2018-03-27 NOTE — Care Plan (Signed)
Problem: Adult Inpatient Plan of Care  Goal: Plan of Care Review  Outcome: Adequate for Discharge  Goal: Patient-Specific Goal (Individualization)  Outcome: Adequate for Discharge  Goal: Absence of Hospital-Acquired Illness or Injury  Outcome: Adequate for Discharge  Goal: Optimal Comfort and Wellbeing  Outcome: Adequate for Discharge  Goal: Rounds/Family Conference  Outcome: Adequate for Discharge     Problem: Pain Acute  Goal: Optimal Pain Control  Outcome: Adequate for Discharge     Problem: Constipation  Goal: Effective Bowel Elimination  Outcome: Adequate for Discharge     Problem: Fluid Deficit (Intestinal Obstruction)  Goal: Fluid Balance  Outcome: Adequate for Discharge     Problem: Infection (Intestinal Obstruction)  Goal: Absence of Infection Signs/Symptoms  Outcome: Adequate for Discharge     Problem: Nausea and Vomiting (Intestinal Obstruction)  Goal: Nausea and Vomiting Relief  Outcome: Adequate for Discharge     Problem: Pain (Intestinal Obstruction)  Goal: Acceptable Pain Control  Outcome: Adequate for Discharge     Problem: Fall Injury Risk  Goal: Absence of Fall and Fall-Related Injury  Outcome: Adequate for Discharge

## 2018-03-27 NOTE — Nurses Notes (Signed)
Pt discharged to home. Patient given discharge instructions, information on new medications and informed of follow up appointments. Chest port de-accessed by RN. Pt denies any pain at this time. Patient to leave floor via wheelchair.     Oneita Kras, LPN

## 2018-04-02 ENCOUNTER — Encounter (INDEPENDENT_AMBULATORY_CARE_PROVIDER_SITE_OTHER): Payer: Self-pay | Admitting: Cardiovascular Disease

## 2018-04-02 ENCOUNTER — Ambulatory Visit (INDEPENDENT_AMBULATORY_CARE_PROVIDER_SITE_OTHER): Payer: Medicare Other | Admitting: Cardiovascular Disease

## 2018-04-02 VITALS — BP 125/70 | HR 58 | Ht 62.0 in | Wt 302.0 lb

## 2018-04-02 DIAGNOSIS — R079 Chest pain, unspecified: Secondary | ICD-10-CM | POA: Diagnosis not present

## 2018-04-02 DIAGNOSIS — R0602 Shortness of breath: Secondary | ICD-10-CM | POA: Diagnosis not present

## 2018-04-02 DIAGNOSIS — I349 Nonrheumatic mitral valve disorder, unspecified: Secondary | ICD-10-CM | POA: Diagnosis not present

## 2018-04-02 DIAGNOSIS — I480 Paroxysmal atrial fibrillation: Secondary | ICD-10-CM | POA: Diagnosis not present

## 2018-04-02 DIAGNOSIS — M25569 Pain in unspecified knee: Secondary | ICD-10-CM

## 2018-04-02 DIAGNOSIS — Z6841 Body Mass Index (BMI) 40.0 and over, adult: Secondary | ICD-10-CM

## 2018-04-02 DIAGNOSIS — R918 Other nonspecific abnormal finding of lung field: Secondary | ICD-10-CM

## 2018-04-02 DIAGNOSIS — C189 Malignant neoplasm of colon, unspecified: Secondary | ICD-10-CM

## 2018-04-02 NOTE — Progress Notes (Signed)
[facility]  [address] [place] [address]   Phone: [phone]    NAME: Kelsey [name]  DOB:  1951-05-16  AGE:  67 y.o.  MRN:  [id number]    APPT:  04/02/2018  9:30 AM EDT    Chief complaint:   Chief Complaint   Patient presents with   . Follow Up       Subjective:     This is a case of a 67 y.o. year old female who comes in today for Follow Up  .      HPI:   Patient came along with husband for evaluation of shortness of breath, chest pain breath patient thinks she is having chest tightness for the last 2 days she calls as pressure can last several hours can go to the back some time to the left arm per she also does get short of breath breath patient was told she has got lung mass she may need surgery.  Patient does have colon cancer also breath patient was in hospital recently for abdominal obstruction    Current Outpatient Medications   Medication Sig   . apixaban (ELIQUIS) 5 mg Oral Tablet Take 1 Tab (5 mg total) by mouth Twice daily   . atorvastatin (LIPITOR) 10 mg Oral Tablet Take 10 mg by mouth Once a day   . budesonide-formoterol (SYMBICORT) 160-4.5 mcg/actuation Inhalation HFA Aerosol Inhaler Take 2 Puffs by inhalation Twice daily   . CARVEDILOL ORAL Take 6.25 mg by mouth Every evening    . dilTIAZem (CARDIZEM CD) 120 mg Oral Capsule, Sust. Release 24 hr Take 120 mg by mouth Once a day   . docusate sodium (COLACE) 100 mg Oral Capsule Take 100 mg by mouth Three times a day   . DULoxetine (CYMBALTA DR) 30 mg Oral Capsule, Delayed Release(E.C.) Take 30 mg by mouth Once a day   . flecainide (TAMBOCOR) 50 mg Oral Tablet Take 1 Tab (50 mg total) by mouth Twice daily   . lisinopril (PRINIVIL) 5 mg Oral Tablet Take 1 Tab (5 mg total) by mouth Once a day   . loperamide (IMODIUM) 2 mg Oral Capsule Take 2 mg by mouth Every 4 hours as needed   . magnesium oxide 200 mg magnesium Oral Tablet Take 400 mg by mouth Once a day   . omeprazole (PRILOSEC) 20 mg Oral Capsule, Delayed  Release(E.C.) Take 40 mg by mouth Once a day    . potassium chloride (K-DUR) 20 mEq Oral Tab Sust.Rel. Particle/Crystal Take 20 mEq by mouth Once a day   . prochlorperazine (COMPAZINE) 10 mg Oral Tablet Take 1 Tab (10 mg total) by mouth Four times a day as needed for nausea/vomiting (Patient not taking: Reported on 04/02/2018)   . rOPINIRole (REQUIP) 0.5 mg Oral Tablet Take 0.5 mg by mouth Three times a day   . traZODone (DESYREL) 100 mg Oral Tablet Take 100 mg by mouth Every night   . VITAMIN B COMPLEX (SUPER B COMPLEX-B-12 ORAL) Take by mouth   . vitamin E (AQUASOL E) 400 unit Oral Capsule Take 400 Units by mouth Once a day       REVIEW OF SYSTEMS:    Heart: [x]  chest pain. []  palpitation. []  orthopnea.  [x]  shortness of breath []  syncope    MS. [x]  joint pain []  myalgia. []  back pain.   Neurologic: []  headaches. []  dizziness []  weakness. []  memory problems.  Vasc: []  claudication []  LE edema.  General:  []  fevers []  weight loss.[]  weight gain. [x]  fatigue.  Heme: []  easy bruising []  bleeding.  HEENT. []  vision changes []  hearing changes. []  dysphagia.  Lungs: []  hemoptysis. []  cough.   Abdomen: []  poor appetite. []  abdominal pain. []  nausea []  vomiting. []  diarrhea. []  constipation.   GU: []  dysuria _  []  Hematuria.Dermatologic: []  rashes. []  pruritus.   Psychiatric: []  Depression. []  anxiety. []  insomnia.       []  = negative  [x]  = positive    Family Medical History:     Problem Relation (Age of Onset)    Diabetes Mother, Maternal Grandmother, Maternal Grandfather, Father    Heart Attack Mother    Stroke Father              Past Medical History:   Diagnosis Date   . A-fib (CMS HCC)    . Arthropathy, unspecified, site unspecified    . Asthma    . Cancer (CMS HCC)     cervical   . Cataract    . Colon cancer (CMS [place]) 07/06/2014   . COPD (chronic obstructive pulmonary disease) (CMS HCC)    . Fistula    . HTN (hypertension)    . Hyperlipidemia    . Obesity    . Sleep apnea    . Ventral hernia        Past Surgical  History:   Procedure Laterality Date   . BOWEL RESECTION     . CATARACT EXTRACTION     . HX CERVICAL CONE BIOPSY      . HX CESAREAN SECTION     . HX GASTRIC BYPASS      25 years ago   . [place] (x2)   . HX HYSTERECTOMY      late 1990s   . HX LAP BANDING      2013   . HX LAPAROTOMY      2013 to remove lap band       Social History     Tobacco Use   Smoking Status Former Smoker   Smokeless Tobacco Never Used   Tobacco Comment    quit 4 yrs ago     Social History     Substance and Sexual Activity   Alcohol Use Yes    Comment: rarely     Social History     Substance and Sexual Activity   Drug Use No     Allergies   Allergen Reactions   . Shellfish Containing Products Shortness of Breath, Rash and Itching     scallops   . Vancomycin Shortness of Breath   . Barium Iodide    . Iodine And Iodide Containing Products      Itching, shortness of breath        Objective:   Physical Exam:   BP 125/70 (Site: Right, Patient Position: Sitting, Cuff Size: Adult)   Pulse 58   Ht 1.575 m (5\' 2" )   Wt (!) 137 kg (302 lb)   BMI 55.24 kg/m       Cardio:  Regular rhythm. S1,S2, and S4 present.Marland Kitchen PMI located in the midclavicular line. No gallops or rubs . systolic murmur at apex.    Vascular:  Both radial and posterior tibial pulses equal. No bruit.     Resp: Clear to auscultation bilaterally. No wheezing and rails    Extremities: No edema. No gait disturbances or weakness. Muscle strength normal in bilateral  upper and lower extremities.    General: No acute distress, alert and oriented x3  HEENT: Conjunctivae are without injection or scleral icterus. Lids normal.  Neck: Neck supple without lymphadenopathy. Thyroid non-tender and without nodules.  Abd: Bowel sounds present in all 4 quadrants. Negative murphy's sign. No rebound tenderness or involuntary guarding. Liver and spleen not palpable.  Skin: No rashes, ulcers, or bruising.  Neuro: No focal deficits. CN 2-12 intact.  Psych: Normal mood and affect.  Recent and remote memory appears to be intact as the patient can provide history for the H&P.      Labs:  No results found for: CHOLESTEROL, HDLCHOL, LDLCHOL, LDLCHOLDIR, TRIG  CBC Results   No results for input(s): WBC, HGB, HCT, PLTCNT, BANDS in the last 72 hours.   BMP Results   No results for input(s): SODIUM, POTASSIUM, CHLORIDE, CO2, BUN, CREATININE, GFR, ANIONGAP in the last 72 hours.  No results for input(s): CALCIUM, ALBUMIN, MAGNESIUM in the last 72 hours.   Coag Results   No results for input(s): INR, PROTHROMTME, APTT in the last 72 hours.    Invalid input(s): PTT, PT, CREACTPROTIE   Cardiac Results      No results for input(s): UHCEASTTROPI, CKMB, MBINDEX, BNP in the last 72 hours.       There are no exam notes on file for this visit.      Assessment/Plan     ENCOUNTER DIAGNOSES     ICD-10-CM   1. Shortness of breath R06.02   2. Chest pain, unspecified type R07.9   3. Paroxysmal atrial fibrillation (CMS HCC) I48.0   4. Right lower lobe lung mass R91.8   5. Colon cancer (CMS HCC) C18.9   6. 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 Knee pain M25.569   8. Morbid obesity with BMI of 50.0-59.9, adult (CMS HCC) E66.01    Z68.43       Chest pain, new onset poor historian poor function capacity showed in hospital recently she got scared GI symptoms she has colon cancer I believe she has got metastatic disease she refuse chemotherapy in the past.  EKG showed normal sinus rhythm spoke all options to the patient the family  Plan will do Lexiscan myocardial from scan as she cannot walk on treadmill    Short of breath, she does have very poor function capacity she is also obese she got lung mass she does have systolic murmur need to see any significant mitral station, aortic stenosis, diastolic dysfunction, pulmonary hypertension  Plan will do echocardiogram    Atrial fibrillation, paroxysmal patient has AFib for long time she was in hospital for abdominal obstruction and rated hold her flecainide and she did  go into atrial fibrillation.  Patient back in sinus rhythm and she is back on flecainide and Eliquis.  Plan stay on Eliquis and flecainide    Preop clearance, patient she has right lung mass she thinks she may need surgery she was to make sure she is okay from cardiac point of view  Plan will discuss after cardiac workup      Orders Placed This Encounter   . ECG W INTERP (CLINIC ONLY)   . MPS- [facility]   . ECHO-[facility]         The patient was given ample opportunity to ask questions and those questions were answered to the patient's satisfaction. The patient was encouraged to be involved in their own care, and all diagnoses, medications, and medication side-effects were discussed.  A copy of the patient's medication list was printed and given to the patient. A good faith effort was made to reconcile the patient's medications.  The patient was told to contact me with any additional questions or concerns, or go to the ED in an emergency.       Riley Churches, MD on 04/02/18 at 16:09.

## 2018-04-03 ENCOUNTER — Ambulatory Visit (INDEPENDENT_AMBULATORY_CARE_PROVIDER_SITE_OTHER): Payer: Medicare Other

## 2018-04-03 DIAGNOSIS — K5909 Other constipation: Secondary | ICD-10-CM | POA: Diagnosis not present

## 2018-04-03 DIAGNOSIS — R918 Other nonspecific abnormal finding of lung field: Secondary | ICD-10-CM | POA: Diagnosis not present

## 2018-04-03 DIAGNOSIS — R079 Chest pain, unspecified: Secondary | ICD-10-CM | POA: Diagnosis not present

## 2018-04-03 DIAGNOSIS — K580 Irritable bowel syndrome with diarrhea: Secondary | ICD-10-CM | POA: Diagnosis not present

## 2018-04-03 DIAGNOSIS — R0602 Shortness of breath: Secondary | ICD-10-CM | POA: Diagnosis not present

## 2018-04-03 DIAGNOSIS — I10 Essential (primary) hypertension: Secondary | ICD-10-CM | POA: Diagnosis not present

## 2018-04-03 NOTE — Procedures (Signed)
[facility] CARDIOVASCULAR, [facility]  [address] [place] [address]  Phone: [phone]  Fax: [phone]    Lexiscan Nuclear    Date: 04/03/18     Patient:  Kelsey [name]  Date of Birth:  Aug 06, 1951 MRN:  [id number]   Gender:  female Height:  157.5 cm (5\' 2" ) Weight:  137 kg (302 lb)       Provider Information:   Ref. Provider:  Riley Churches, MD  PCP:  [facility]      Indication:    Chest Pain (R07.9)   Shortness of breath (R06.02)           Clinical Data:  patient being evaluated for ischemia    Procedure:  [facility].  Lexiscan dosage:  0.4 mg IM.      Drug Dose Route   Stress Agent Dose: Regadenoson (Lexiscan)  0.4 mg IV   Resting Drug Dose: TC35m SestaMIBI 9.1 milliCuries IV   Stress Drug Dose: TC54m SestaMIBI 27.3 milliCuries IV         Stress Test:  The patient is unable to exercise due to inability to achieve adequate work level with treadmill exercise and therefore received pharmacological sterss with Lexiscan 0.4 mg for 1 minutes 0 seconds.  Resting heart rate was 57.  Resting blood pressure was 138/80.  The peak HR was 74 (48% of PMHR).   Peak blood pressure was 142/70 .  METS:  1.  The patient experienced the following symptoms:  Shortness of Breath.  Heart rate response:  Normal.  The blood pressure response was Normal.    Arrhythmias:  None.   Resting Electrocardiogram showed:  NSR, NSSTT changes.    Stress Electrocardiogram showed:  Nondiagnositc.    Radionuclide Imaging Procedure:  The patient's heart was tomographically imaged at rest with TC43m SestaMIBI.  Subsequently, during [facility], the patient was injected with TC41m SestaMIBI and a gated acquisition was performed.  Corresponding tomographic slices in the stress and resting phases were directly compared using a visual qualitative inspection to assess for reperfusion.  [place] analysis was also performed.  Wall motion, wall thickening and an estimated LVEF were generated from  separate review of the quantitated gated data.  This was done in a laboratory equipped with a double-headed camera as well as the appropriate software to complete this study satisfactorily.        Findings:    Symptoms:  Patient experienced no chest pain.   Review of raw data:  No significant patient motion artifact.  Heart size:  Normal.  Stress Tomographic Slices:  A normal myocardial perfusion pattern  Resting Tomographic Slices:  Reversibility of stress defect is  negative.  Gated Study:  Yes.  Separate review of dynamic gated displays reveal normal wall motion with an estimated EF of 66 %.  Prior Study:  There is no prior study for comparison.        Impression:    Perfusion Status 1:  No significant ischemia with estimated EF of 66 %.  Recommendations:  Told patient to be active and call if any symptoms  and Patient is aware of limitation of the test.        Results reviewed with patient same day as this procedure:  Yes     .    Reading MD: Riley Churches, MD   Technician:   Jeanne Ivan, RT (R)(N), CNMT   Reading Date/Time: 04/03/18 16:29 Supervising MD:   Riley Churches, MD

## 2018-04-07 ENCOUNTER — Ambulatory Visit (INDEPENDENT_AMBULATORY_CARE_PROVIDER_SITE_OTHER): Payer: Medicare Other

## 2018-04-07 DIAGNOSIS — R079 Chest pain, unspecified: Secondary | ICD-10-CM | POA: Diagnosis not present

## 2018-04-07 DIAGNOSIS — I10 Essential (primary) hypertension: Secondary | ICD-10-CM | POA: Diagnosis not present

## 2018-04-07 DIAGNOSIS — I4891 Unspecified atrial fibrillation: Secondary | ICD-10-CM | POA: Diagnosis not present

## 2018-04-07 DIAGNOSIS — R0602 Shortness of breath: Secondary | ICD-10-CM | POA: Diagnosis not present

## 2018-04-07 NOTE — Procedures (Signed)
[facility] CARDIOVASCULAR, [facility]  [address] [place] [address]  Phone: [phone]  Fax: [phone]    Transthoracic Echocardiogram    04/07/18    Patient:  Kelsey [name]  Date of Birth:  04/12/1951 MRN:  [id number]   Gender:  female Height:  157.5 cm (5\' 2" ) Weight:  137 kg (302 lb)       Provider Information:   Ref. Provider:  Riley Churches, MD  PCP:  [facility]      Indication:    Chest Pain (R07.9)   Shortness of breath (R06.02)         Data:    Dimensions Diastolic Function   Septum 1.2 cm E/A Ratio 2.17   Post Wall 1.1 cm Deceleration Time 243 msec   LVEDD 5.8 cm MV Valsalva E/A     LVSD 3.2 cm Pulmonary Vein Ratio     AO Root 2.8 cm E'   cm/sec   L Atrium 3.9 cm A'   cm/sec   L Atrium Vol   ml E/E' Ratio     RV Diast 3.6 cm    R Atrium 4.2 cm    LVOT Diameter   cm    LV Mass   g      Aortic Valve Mitral Valve Tricuspid Valve   Peak Velocity 1.4 m/s Peak Velocity E 1.3 m/s Peak Velocity   m/s   Mean Velocity   m/s Peak Velocity A 0.6 m/s TR Velocity 0.9 m/s   LVOT Velocity 1 m/s Mean Gradient   mmHg ERAP 5 mmHg   Mean Gradient   mmHg MV Area   cm2 Est RVSP 8 mmHg   Max Gradient   mmHg MV Index   cm2/ m2 Pulmonic Valve   AV Area   cm2 Pressure Half Time   msec Peak Velocity 0.9 m/s   AVA Index   cm2/m2 ERO   cm2 Accel  Time   msec   Pressure Half Time   msec Regurgitation Vol   ml    ERO   cm2     Regurgitation Vol   ml            Findings:    1.  Technical Details:  Rhythm:  Atrial fibrillation; Technical quality:  Fair.  2.  Left Ventricular Wall Thickness:  The posterior wall thickness is Mildly increased.  The septum is Mildly increased.  3.  Left Ventricular Diastolic Function:  Grade:  I-Delayed relaxation.  4.  Other Chamber Sizes:  The left atrium is normal.  The right ventricle is normal.  The right atrium is normal.  5.  Right Ventricular Systolic Function:  Right ventricular systolic function is connot assess.  6.  Right Ventricular Systolic  Pressure:  The right ventricular systolic pressure is estimated at 8 mmHg.  7.  Valves:  Mitral Valve: Normal.  Aortic Valve: Normal.  Tricuspid and pulmonic valves are structurally normal.  8.  Color Flow and Doppler Interrogation reveals:   Mild to moderate Mitral regurgitation, No Mitral stenosis; No Aortic regurgitation, No Aortic stenosis; No Pulmonic regurgitation, No Pulmonic stenosis; Mild Tricuspid regurgitation, No Tricuspid stenosis.  8.  Additional Findings:     Pericardium:  No effusion   Pleural effusion:  None.   Mass:  None.   Clot:  None.     Vegetation:  None.   IVC:  Normal in size with inspiratory collapse    Summary:    1.  LV  Size/Function:  The left ventricle size is normal.  EF is 50-55%.  Cannot assess wall motion because endocardium is not well seen.  2.  Color Flow and Doppler Interrogation reveals: Mild to Moderate MR and Mild TR. Grade I-Delayed relaxation.    Recommendations:  Told patient to be active and call if any symptoms  and Patient is aware of limitation of the test.        Results reviewed with patient same day as this procedure:  Yes       .    Reading MD: Riley Churches, MD   Technician:   Rosalio Loud, RDCS, RVS   Reading Date/Time: 04/07/18 16:06 Supervising MD:   Riley Churches, MD

## 2018-04-16 DIAGNOSIS — R591 Generalized enlarged lymph nodes: Secondary | ICD-10-CM | POA: Diagnosis not present

## 2018-04-16 DIAGNOSIS — R590 Localized enlarged lymph nodes: Secondary | ICD-10-CM | POA: Diagnosis not present

## 2018-04-16 DIAGNOSIS — R918 Other nonspecific abnormal finding of lung field: Secondary | ICD-10-CM | POA: Diagnosis not present

## 2018-04-16 DIAGNOSIS — J9811 Atelectasis: Secondary | ICD-10-CM | POA: Diagnosis not present

## 2018-04-16 DIAGNOSIS — K760 Fatty (change of) liver, not elsewhere classified: Secondary | ICD-10-CM | POA: Diagnosis not present

## 2018-04-22 ENCOUNTER — Ambulatory Visit
Payer: Medicare Other | Attending: THORACIC SURGERY CARDIOTHORACIC VASCULAR SURGERY | Admitting: THORACIC SURGERY CARDIOTHORACIC VASCULAR SURGERY

## 2018-04-22 ENCOUNTER — Encounter (HOSPITAL_BASED_OUTPATIENT_CLINIC_OR_DEPARTMENT_OTHER): Payer: Self-pay | Admitting: THORACIC SURGERY CARDIOTHORACIC VASCULAR SURGERY

## 2018-04-22 VITALS — BP 159/78 | HR 56 | Temp 97.0°F | Resp 18 | Ht 62.0 in | Wt 306.0 lb

## 2018-04-22 DIAGNOSIS — I4891 Unspecified atrial fibrillation: Secondary | ICD-10-CM | POA: Diagnosis not present

## 2018-04-22 DIAGNOSIS — R918 Other nonspecific abnormal finding of lung field: Secondary | ICD-10-CM | POA: Diagnosis not present

## 2018-04-22 DIAGNOSIS — R590 Localized enlarged lymph nodes: Secondary | ICD-10-CM | POA: Diagnosis not present

## 2018-04-22 DIAGNOSIS — I10 Essential (primary) hypertension: Secondary | ICD-10-CM | POA: Diagnosis not present

## 2018-04-22 NOTE — H&P (Signed)
[facility] Thoracic Surgery        Initial Consult Note     Name: Kelsey [name]  MRN: [id number]  Date of visit: 04/22/2018     HPI  Patient is a 67 y.o. female who was seen today for lung mass.  She had abdominal CT recently in workup for IBS symptoms.  It incidentally located a RLL mass.  Maretta Bees, NP ordered follow up CT chest.  I reviewed the images and note a 5 cm, spiculated RLL mass with extension to the hilum and enlargement of the subcarinal nodes.  The patient no longer smokes, but did for 40 years.  She has a history of cervical cancer, separate uterine cancer, and colon cancer.  She has had hysterectomy and colectomy.  The colon cancer was treated 3 years ago.  She has had a gastric bypass, but is still morbidly obese.  She denies unintentional weight loss, hemoptysis, cough, and night sweats.    Patient Active Problem List    Diagnosis Date Noted   . Right lower lobe lung mass 03/22/2018   . Colon cancer (CMS [place]) 07/06/2014   . HTN (hypertension)    . Hyperlipidemia    . Obesity    . Arthropathy, unspecified, site unspecified    . COPD (chronic obstructive pulmonary disease) (CMS HCC)    . Ventral hernia      Past Medical History:   Diagnosis Date   . A-fib (CMS HCC)    . Arthropathy, unspecified, site unspecified    . Asthma    . Cancer (CMS HCC)     cervical   . Cataract    . Colon cancer (CMS [place]) 07/06/2014   . COPD (chronic obstructive pulmonary disease) (CMS HCC)    . Fistula    . HTN (hypertension)    . Hyperlipidemia    . Obesity    . Sleep apnea    . Ventral hernia           Past Surgical History:   Procedure Laterality Date   . BOWEL RESECTION     . CATARACT EXTRACTION     . HX CERVICAL CONE BIOPSY      . HX CESAREAN SECTION     . HX GASTRIC BYPASS      25 years ago   . [place] (x2)   . HX HYSTERECTOMY      late 1990s   . HX LAP BANDING      2013   . HX LAPAROTOMY      2013 to remove lap band          Current Outpatient Medications   Medication Sig   . apixaban (ELIQUIS) 5  mg Oral Tablet Take 1 Tab (5 mg total) by mouth Twice daily   . atorvastatin (LIPITOR) 10 mg Oral Tablet Take 10 mg by mouth Once a day   . budesonide-formoterol (SYMBICORT) 160-4.5 mcg/actuation Inhalation HFA Aerosol Inhaler Take 2 Puffs by inhalation Twice daily   . CARVEDILOL ORAL Take 6.25 mg by mouth Every evening    . dilTIAZem (CARDIZEM CD) 120 mg Oral Capsule, Sust. Release 24 hr Take 120 mg by mouth Once a day   . docusate sodium (COLACE) 100 mg Oral Capsule Take 100 mg by mouth Three times a day   . DULoxetine (CYMBALTA DR) 30 mg Oral Capsule, Delayed Release(E.C.) Take 30 mg by mouth Once a day   . flecainide (TAMBOCOR) 50 mg  Oral Tablet Take 1 Tab (50 mg total) by mouth Twice daily   . lisinopril (PRINIVIL) 5 mg Oral Tablet Take 1 Tab (5 mg total) by mouth Once a day   . loperamide (IMODIUM) 2 mg Oral Capsule Take 2 mg by mouth Every 4 hours as needed   . magnesium oxide 200 mg magnesium Oral Tablet Take 400 mg by mouth Once a day   . omeprazole (PRILOSEC) 20 mg Oral Capsule, Delayed Release(E.C.) Take 40 mg by mouth Once a day    . potassium chloride (K-DUR) 20 mEq Oral Tab Sust.Rel. Particle/Crystal Take 20 mEq by mouth Once a day   . prochlorperazine (COMPAZINE) 10 mg Oral Tablet Take 1 Tab (10 mg total) by mouth Four times a day as needed for nausea/vomiting   . rOPINIRole (REQUIP) 0.5 mg Oral Tablet Take 0.5 mg by mouth Three times a day   . traZODone (DESYREL) 100 mg Oral Tablet Take 100 mg by mouth Every night   . VITAMIN B COMPLEX (SUPER B COMPLEX-B-12 ORAL) Take by mouth   . vitamin E (AQUASOL E) 400 unit Oral Capsule Take 400 Units by mouth Once a day     Allergies   Allergen Reactions   . Shellfish Containing Products Shortness of Breath, Rash and Itching     scallops   . Vancomycin Shortness of Breath   . Barium Iodide    . Iodine And Iodide Containing Products      Itching, shortness of breath       Social History     Occupational History   . Not on file     Social History     Tobacco Use      . Smoking status: Former Research scientist (life sciences)   . Smokeless tobacco: Never Used   . Tobacco comment: quit 4 yrs ago   Substance Use Topics   . Alcohol use: Yes     Comment: rarely     Social History     Substance and Sexual Activity   Drug Use No     Family Medical History:     Problem Relation (Age of Onset)    Diabetes Mother, Maternal Grandmother, Maternal Grandfather, Father    Heart Attack Mother    Stroke Father             Review of Systems  Other than ROS in the HPI, all other systems were negative.    Examination:    BP (!) 159/78   Pulse 56   Temp 36.1 C (97 F) (Tympanic)   Resp 18   Ht 1.575 m (5\' 2" )   Wt (!) 138.8 kg (306 lb)   SpO2 94%   BMI 55.97 kg/m       General:  obese  Head:   Normocephalic, without obvious abnormality or trauma.  Eyes: Conjunctivae clear. Sclera non-icteric.  Neck:  supraclavicular lymph nodes are  nonpalpable   Lungs:  clear to auscultation  bilaterally  Heart:  Regular rate and rhythm, S1, S2 normal, no murmur, click, rub or gallop  Vascular:  Carotid's without bruit. Upper and lower extremities with good pulses  Abdomen:  Soft, non-tender. Bowel sounds normal. No masses,  No organomegaly.  Extremities:  Extremities normal, atraumatic, no cyanosis or edema.   Neurologic:  CN II-XII intact. Normal strength, sensation and reflexes throughout.    Assessment:  1. Right lower lobe lung mass    2. Mediastinal lymphadenopathy    Highly suspicious for primary lung cancer with nodal  metastasis.  CT guided biopsy of the mass is indicated.  If positive for cancer, will next need a PET scan.     Plan:  Orders Placed This Encounter   . CT BIOPSY LUNG       see back after the above testing.      Clair Gulling, MD

## 2018-05-02 ENCOUNTER — Encounter (HOSPITAL_COMMUNITY): Payer: Self-pay

## 2018-05-06 ENCOUNTER — Other Ambulatory Visit (HOSPITAL_COMMUNITY): Payer: Self-pay | Admitting: THORACIC SURGERY CARDIOTHORACIC VASCULAR SURGERY

## 2018-05-06 ENCOUNTER — Ambulatory Visit (HOSPITAL_COMMUNITY): Payer: Medicare Other

## 2018-05-06 ENCOUNTER — Ambulatory Visit
Admission: RE | Admit: 2018-05-06 | Discharge: 2018-05-06 | Disposition: A | Discharge status: Home / Self Care | Payer: Medicare Other | Source: Ambulatory Visit | Attending: THORACIC SURGERY CARDIOTHORACIC VASCULAR SURGERY | Admitting: THORACIC SURGERY CARDIOTHORACIC VASCULAR SURGERY

## 2018-05-06 ENCOUNTER — Encounter (HOSPITAL_COMMUNITY): Payer: Self-pay

## 2018-05-06 ENCOUNTER — Ambulatory Visit (HOSPITAL_COMMUNITY)
Admission: RE | Admit: 2018-05-06 | Discharge: 2018-05-06 | Disposition: A | Discharge status: Home / Self Care | Payer: Medicare Other | Source: Ambulatory Visit

## 2018-05-06 DIAGNOSIS — R918 Other nonspecific abnormal finding of lung field: Secondary | ICD-10-CM | POA: Diagnosis not present

## 2018-05-06 DIAGNOSIS — C3491 Malignant neoplasm of unspecified part of right bronchus or lung: Secondary | ICD-10-CM | POA: Diagnosis not present

## 2018-05-06 DIAGNOSIS — R590 Localized enlarged lymph nodes: Secondary | ICD-10-CM

## 2018-05-06 MED ORDER — SODIUM CHLORIDE 0.9 % (FLUSH) INJECTION SYRINGE
3.00 mL | INJECTION | Freq: Three times a day (TID) | INTRAMUSCULAR | Status: DC
Start: 2018-05-06 — End: 2018-05-07

## 2018-05-06 MED ORDER — SODIUM CHLORIDE 0.9 % (FLUSH) INJECTION SYRINGE
3.00 mL | INJECTION | INTRAMUSCULAR | Status: DC | PRN
Start: 2018-05-06 — End: 2018-05-07

## 2018-05-06 NOTE — Discharge Instructions (Signed)
[facility] Discharge Instructions for CT Guided Needle Biopsy given to patient and discussed

## 2018-05-06 NOTE — OR PostOp (Signed)
IR POST-PROCEDURE NOTE  DIAGNOSIS:  Right lower lobe lung mass  PROCEDURE:  CT GUIDED BIOPSY right lower lobe lung mass  RESULTS:  SUCCESSFUL  PHYSICIAN:  Eulis Manly, DO  CONDITION:  GOOD  COMPLICATIONS: NONE  BLOOD LOSS:  NONE  SPECIMEN REMOVED: Three 20 gauge core biopsy samples

## 2018-05-15 ENCOUNTER — Encounter (HOSPITAL_BASED_OUTPATIENT_CLINIC_OR_DEPARTMENT_OTHER): Payer: Self-pay | Admitting: THORACIC SURGERY CARDIOTHORACIC VASCULAR SURGERY

## 2018-05-15 DIAGNOSIS — Z9049 Acquired absence of other specified parts of digestive tract: Secondary | ICD-10-CM | POA: Diagnosis not present

## 2018-05-15 DIAGNOSIS — Z8541 Personal history of malignant neoplasm of cervix uteri: Secondary | ICD-10-CM | POA: Diagnosis not present

## 2018-05-15 DIAGNOSIS — R110 Nausea: Secondary | ICD-10-CM | POA: Diagnosis not present

## 2018-05-15 DIAGNOSIS — I10 Essential (primary) hypertension: Secondary | ICD-10-CM | POA: Diagnosis not present

## 2018-05-15 DIAGNOSIS — Z90710 Acquired absence of both cervix and uterus: Secondary | ICD-10-CM | POA: Diagnosis not present

## 2018-05-15 DIAGNOSIS — J449 Chronic obstructive pulmonary disease, unspecified: Secondary | ICD-10-CM | POA: Diagnosis not present

## 2018-05-15 DIAGNOSIS — E876 Hypokalemia: Secondary | ICD-10-CM | POA: Diagnosis not present

## 2018-05-15 DIAGNOSIS — Z85038 Personal history of other malignant neoplasm of large intestine: Secondary | ICD-10-CM | POA: Diagnosis not present

## 2018-05-15 DIAGNOSIS — I480 Paroxysmal atrial fibrillation: Secondary | ICD-10-CM | POA: Diagnosis not present

## 2018-05-15 DIAGNOSIS — R197 Diarrhea, unspecified: Secondary | ICD-10-CM | POA: Diagnosis not present

## 2018-05-20 DIAGNOSIS — E876 Hypokalemia: Secondary | ICD-10-CM | POA: Diagnosis not present

## 2018-05-20 DIAGNOSIS — I10 Essential (primary) hypertension: Secondary | ICD-10-CM | POA: Diagnosis not present

## 2018-05-20 DIAGNOSIS — Z760 Encounter for issue of repeat prescription: Secondary | ICD-10-CM | POA: Diagnosis not present

## 2018-05-20 DIAGNOSIS — M2550 Pain in unspecified joint: Secondary | ICD-10-CM | POA: Diagnosis not present

## 2018-05-20 DIAGNOSIS — E785 Hyperlipidemia, unspecified: Secondary | ICD-10-CM | POA: Diagnosis not present

## 2018-05-22 ENCOUNTER — Encounter (HOSPITAL_COMMUNITY): Payer: Self-pay | Admitting: Internal Medicine

## 2018-05-23 ENCOUNTER — Ambulatory Visit
Payer: Medicare Other | Attending: THORACIC SURGERY CARDIOTHORACIC VASCULAR SURGERY | Admitting: THORACIC SURGERY CARDIOTHORACIC VASCULAR SURGERY

## 2018-05-23 ENCOUNTER — Encounter (HOSPITAL_BASED_OUTPATIENT_CLINIC_OR_DEPARTMENT_OTHER): Payer: Self-pay | Admitting: THORACIC SURGERY CARDIOTHORACIC VASCULAR SURGERY

## 2018-05-23 VITALS — BP 141/80 | HR 82 | Temp 97.0°F | Resp 18 | Ht 62.0 in | Wt 296.0 lb

## 2018-05-23 DIAGNOSIS — C3431 Malignant neoplasm of lower lobe, right bronchus or lung: Secondary | ICD-10-CM | POA: Diagnosis not present

## 2018-05-23 NOTE — Progress Notes (Signed)
[facility] Thoracic Surgery  Progress Note    Name: Kelsey [name]  MRN: [id number]  Date of visit: 05/23/2018     Subjective:  Patient is a 66 y.o. female who was seen today for lung mass.  She had abdominal CT recently in workup for IBS symptoms.  It incidentally located a RLL mass.  Maretta Bees, NP ordered follow up CT chest.  I reviewed the images and note a 5 cm, spiculated RLL mass with extension to the hilum and enlargement of the subcarinal nodes.  The patient no longer smokes, but did for 40 years.  She has a history of cervical cancer, separate uterine cancer, and colon cancer.  She has had hysterectomy and colectomy.  The colon cancer was treated 3 years ago.  She has had a gastric bypass, but is still morbidly obese.  She denies unintentional weight loss, hemoptysis, cough, and night sweats.  CT guided biopsy resulted adenocarcinoma.    Patient Active Problem List    Diagnosis Date Noted   . Right lower lobe lung mass 03/22/2018   . Colon cancer (CMS [place]) 07/06/2014   . HTN (hypertension)    . Hyperlipidemia    . Obesity    . Arthropathy, unspecified, site unspecified    . COPD (chronic obstructive pulmonary disease) (CMS HCC)    . Ventral hernia        Objective:  BP (!) 141/80   Pulse 82   Temp 36.1 C (97 F) (Tympanic)   Resp 18   Ht 1.575 m (5\' 2" )   Wt 134.3 kg (296 lb)   SpO2 95%   BMI 54.14 kg/m       Physical Exam:  General: Well nourished.  Lungs:  Clear to auscultation bilaterally  Heart: Regular rate and rhythm, S1, S2 normal, no murmur, click, rub or gallop  Abdomen: Soft, non-tender. Bowel sounds normal. No masses,  No organomegaly.  Extremities: Extremities normal, atraumatic, no cyanosis or edema.    Assessment:  1. Malignant neoplasm of lower lobe of right lung (CMS HCC)    Likely advanced disease.  Proceed with staging PET.     Plan:  Orders Placed This Encounter   . [place] PET AQ:TMAU (HEAD TO THIGH) WO IV CONTRAST      See following.  She is scheduled to see Dr. Deatra Canter on 05/29/18.    Clair Gulling, MD

## 2018-05-27 ENCOUNTER — Encounter (INDEPENDENT_AMBULATORY_CARE_PROVIDER_SITE_OTHER): Payer: Self-pay

## 2018-05-27 ENCOUNTER — Encounter (INDEPENDENT_AMBULATORY_CARE_PROVIDER_SITE_OTHER): Payer: Medicare Other | Admitting: Internal Medicine

## 2018-06-03 DIAGNOSIS — E876 Hypokalemia: Secondary | ICD-10-CM | POA: Diagnosis not present

## 2018-06-03 DIAGNOSIS — C3431 Malignant neoplasm of lower lobe, right bronchus or lung: Secondary | ICD-10-CM | POA: Diagnosis not present

## 2018-06-05 ENCOUNTER — Other Ambulatory Visit (HOSPITAL_COMMUNITY): Payer: Self-pay | Admitting: Radiology

## 2018-06-12 ENCOUNTER — Encounter (HOSPITAL_BASED_OUTPATIENT_CLINIC_OR_DEPARTMENT_OTHER): Payer: Self-pay | Admitting: THORACIC SURGERY CARDIOTHORACIC VASCULAR SURGERY

## 2018-06-23 ENCOUNTER — Ambulatory Visit
Admission: RE | Admit: 2018-06-23 | Discharge: 2018-06-23 | Disposition: A | Discharge status: Home / Self Care | Payer: Medicare Other | Source: Ambulatory Visit | Attending: THORACIC SURGERY CARDIOTHORACIC VASCULAR SURGERY | Admitting: THORACIC SURGERY CARDIOTHORACIC VASCULAR SURGERY

## 2018-06-23 DIAGNOSIS — R160 Hepatomegaly, not elsewhere classified: Secondary | ICD-10-CM | POA: Diagnosis not present

## 2018-06-23 DIAGNOSIS — C3431 Malignant neoplasm of lower lobe, right bronchus or lung: Secondary | ICD-10-CM | POA: Diagnosis not present

## 2018-06-23 DIAGNOSIS — K760 Fatty (change of) liver, not elsewhere classified: Secondary | ICD-10-CM | POA: Diagnosis not present

## 2018-06-23 LAB — POC BLOOD GLUCOSE (RESULTS): GLUCOSE, POC: 76 mg/dL (ref 70–110)

## 2018-06-25 DIAGNOSIS — M25561 Pain in right knee: Secondary | ICD-10-CM | POA: Diagnosis not present

## 2018-06-25 DIAGNOSIS — C189 Malignant neoplasm of colon, unspecified: Secondary | ICD-10-CM | POA: Diagnosis not present

## 2018-06-25 DIAGNOSIS — R918 Other nonspecific abnormal finding of lung field: Secondary | ICD-10-CM | POA: Diagnosis not present

## 2018-06-25 DIAGNOSIS — C539 Malignant neoplasm of cervix uteri, unspecified: Secondary | ICD-10-CM | POA: Diagnosis not present

## 2018-06-25 DIAGNOSIS — E669 Obesity, unspecified: Secondary | ICD-10-CM | POA: Diagnosis not present

## 2018-06-25 DIAGNOSIS — M1711 Unilateral primary osteoarthritis, right knee: Secondary | ICD-10-CM | POA: Diagnosis not present

## 2018-06-25 DIAGNOSIS — C3431 Malignant neoplasm of lower lobe, right bronchus or lung: Secondary | ICD-10-CM | POA: Diagnosis not present

## 2018-06-25 DIAGNOSIS — I4891 Unspecified atrial fibrillation: Secondary | ICD-10-CM | POA: Diagnosis not present

## 2018-06-25 DIAGNOSIS — M2550 Pain in unspecified joint: Secondary | ICD-10-CM | POA: Diagnosis not present

## 2018-06-26 ENCOUNTER — Inpatient Hospital Stay (HOSPITAL_COMMUNITY)
Admission: RE | Admit: 2018-06-26 | Discharge: 2018-06-26 | Disposition: A | Discharge status: Still In Hospital | Payer: Medicare Other | Source: Ambulatory Visit | Attending: Radiation Oncology | Admitting: Radiation Oncology

## 2018-06-26 ENCOUNTER — Encounter (HOSPITAL_BASED_OUTPATIENT_CLINIC_OR_DEPARTMENT_OTHER): Payer: Self-pay | Admitting: THORACIC SURGERY CARDIOTHORACIC VASCULAR SURGERY

## 2018-06-26 ENCOUNTER — Ambulatory Visit (HOSPITAL_BASED_OUTPATIENT_CLINIC_OR_DEPARTMENT_OTHER): Payer: Medicare Other | Admitting: Internal Medicine

## 2018-06-26 ENCOUNTER — Ambulatory Visit (HOSPITAL_BASED_OUTPATIENT_CLINIC_OR_DEPARTMENT_OTHER): Payer: Medicare Other | Admitting: THORACIC SURGERY CARDIOTHORACIC VASCULAR SURGERY

## 2018-06-26 ENCOUNTER — Encounter (HOSPITAL_COMMUNITY): Payer: Self-pay | Admitting: Radiation Oncology

## 2018-06-26 ENCOUNTER — Encounter (HOSPITAL_COMMUNITY): Payer: Self-pay

## 2018-06-26 VITALS — BP 173/84 | HR 66 | Temp 97.0°F | Resp 18 | Ht 62.0 in | Wt 286.0 lb

## 2018-06-26 VITALS — BP 163/68 | HR 54 | Temp 96.0°F | Ht 62.0 in | Wt 286.0 lb

## 2018-06-26 DIAGNOSIS — C3490 Malignant neoplasm of unspecified part of unspecified bronchus or lung: Secondary | ICD-10-CM | POA: Diagnosis not present

## 2018-06-26 DIAGNOSIS — C3431 Malignant neoplasm of lower lobe, right bronchus or lung: Secondary | ICD-10-CM | POA: Diagnosis not present

## 2018-06-26 DIAGNOSIS — Z87891 Personal history of nicotine dependence: Secondary | ICD-10-CM | POA: Diagnosis not present

## 2018-06-26 DIAGNOSIS — Z9884 Bariatric surgery status: Secondary | ICD-10-CM | POA: Diagnosis not present

## 2018-06-26 DIAGNOSIS — Z510 Encounter for antineoplastic radiation therapy: Secondary | ICD-10-CM | POA: Diagnosis not present

## 2018-06-26 DIAGNOSIS — Z5111 Encounter for antineoplastic chemotherapy: Secondary | ICD-10-CM | POA: Diagnosis not present

## 2018-06-26 DIAGNOSIS — C3491 Malignant neoplasm of unspecified part of right bronchus or lung: Secondary | ICD-10-CM

## 2018-06-26 NOTE — Progress Notes (Signed)
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    NARRATIVE:  Ms. Briones is a morbidly obese 67 year old female with countless medical problems who while undergoing a CT scan for evaluation of inflammatory bowel disease was found to have a right lower lobe lung mass.  She was entirely asymptomatic with the exception of perhaps some minor increased shortness of breath.  She denies weight loss or other change in constitutional symptoms.  On May 06, 2018, a right lower lobe lung mass biopsy was consistent with adenocarcinoma.  The PET scan confirms the presence of a right lower lobe lung mass with some assorted mediastinal hilar adenopathy.  She is not operable and is now being referred for combined modality chemotherapy and radiation.     The patient is an ECOG 1.      She has no pain issues.     She no longer smokes nor drinks.      She has multiple comorbidities, all of which are listed in the chart.      She is on innumerable medications, again, all of which are listed in the chart.      She is disabled.  She has had a previous gastric bypass procedure 25 years ago.     On exam, she is 306 pounds.  She is in a wheelchair.     Of note, she underwent colon resection 3 years ago for which she underwent postoperative chemotherapy.     At the present time, the patient has an unresectable adenocarcinoma of the right lung.  We will commence combined modality chemotherapy and radiation therapy next week.  All aspects of treatment were discussed in detail as well as short-term and long-term side effects.  A consent was signed.        Johnanna Schneiders, MD              CC:   Vanetta Mulders, MD   Fax: [phone]     Karl Luke, MD   [place]   [place]   [place], [place] [address]   Fax: [phone]     [facility]   [address]   [place], [place] [address]     Almon Hercules, MD   Fax: [phone]       DD:  06/26/2018 15:17:53  DT:  06/26/2018 15:29:04 JG  D#:  [id number]

## 2018-06-26 NOTE — Nurses Notes (Signed)
Wt 296 lbs.  Pt in room 2 for [facility] from Dr. Deatra Canter for Lung Ca.  Questionnaire complete, reviewed and in pt's chart to be scanned. Jimmye Norman, RN    Lung CT/SIM per [employer]. Apt scheduled at front desk.  Consent signed, witnessed and at front desk to be scanned. Education complete, pt and husband verbalized understanding.

## 2018-06-26 NOTE — Progress Notes (Signed)
[facility] Thoracic Surgery  Progress Note    Name: Kelsey [name]  MRN: [id number]  Date of visit: 06/26/2018     Subjective:  Patient is a67 y.o.femalewho was seen today for lung mass.She had abdominal CT recently in workup for IBS symptoms. It incidentally located a RLL mass. Maretta Bees, NP ordered follow up CT chest. I reviewed the images and note a 5 cm, spiculated RLL mass with extension to the hilum and enlargement of the subcarinal nodes. The patient no longer smokes, but did for 40 years. She has a history of cervical cancer, separate uterine cancer, and colon cancer. She has had hysterectomy and colectomy. The colon cancer was treated 3 years ago. She has had a gastric bypass, but is still morbidly obese. She denies unintentional weight loss, hemoptysis, cough, and night sweats.  CT guided biopsy resulted adenocarcinoma.  PET done.  I reviewed the images and note bulky ipsilateral mediastinal nodal metastasis and possibly a left supraclavicular nodal met.  She is to see Dr. Deatra Canter today.    Patient Active Problem List    Diagnosis Date Noted   . Right lower lobe lung mass 03/22/2018   . Colon cancer (CMS [place]) 07/06/2014   . HTN (hypertension)    . Hyperlipidemia    . Obesity    . Arthropathy, unspecified, site unspecified    . COPD (chronic obstructive pulmonary disease) (CMS HCC)    . Ventral hernia        Objective:  BP (!) 173/84   Pulse 66   Temp 36.1 C (97 F) (Tympanic)   Resp 18   Ht 1.575 m (5' 2" )   Wt 129.7 kg (286 lb)   SpO2 95%   BMI 52.31 kg/m       Physical Exam:  General: Well nourished.  Lungs:  Clear to auscultation bilaterally  Heart: Regular rate and rhythm, S1, S2 normal, no murmur, click, rub or gallop  Abdomen: Soft, non-tender. Bowel sounds normal. No masses,  No organomegaly.  Extremities: Extremities normal, atraumatic, no cyanosis or edema.    Assessment:  1. Malignant neoplasm of lower lobe of right lung (CMS HCC)    At least Stage IIIA, probably IIIB.  Inoperable.          Plan:  No orders of the defined types were placed in this encounter.     Proceed with chemoradiation.    Clair Gulling, MD

## 2018-06-26 NOTE — Nursing Note (Signed)
Navigator: Met with pt in radiation exam room. I introduced myself and role of navigator. Pt is newly diagnosed lung cancer. Plan is for her to start chemoradiation 07/07/18. She already has a port in place. She will be starting Taxol and Carboplatin. I told her I will be giving her extensive chemo teaching when she comes back. She requested a courtesy room, but we do not have any available. She has Medicare and Medicaid, so I provided the [employer] information. However I did give them a gas card and filled out the [facility] application to get them started. I encouraged them to call me back with any questions or concerns. Contact information provided.

## 2018-06-26 NOTE — Progress Notes (Signed)
[place]  [place] [address]        Encounter Date: 06/26/2018   1:15 PM EST    Name:  Kelsey [name]  Age: 68 y.o.  DOB: 1951-02-21  Sex: female  PCP: Benjie Karvonen    Chief Complaint:  No chief complaint on file.        HISTORY OF PRESENT ILLNESS:   67 y.o. female with history of stage III colon cancer of present evaluation and management of lung cancer.    1. September 2015. Patient underwent sigmoidoscopy and was found to have polyp which was adenocarcinoma.    2. June 10, 2014. She was admitted for segmental resection of sigmoid colon. This revealed pT3, N1a, MX, low-grade  adenocarcinoma of the colon. The mass measuring 4 x 3 x 0.5 centimeters. It  was low-grade and there was no evidence of microsatellite instability by  histology. Margins were uninvolved and 4 lymph nodes only were removed which  only 1 was involved. This corresponding stage IIIB, T3, N1a, M0, low-grade  sigmoid cancer.   3. July 07, 2014. Recommended adjuvant chemotherapy with FOLFOX.    4. July 27, 2014. Patient was started on adjuvant chemotherapy with FOLFOX.  Patient received only 5 treatments and chemotherapy was stopped due to poor tolerance.  After that patient lost follow-up.      5. October 2019. Patient was admitted to hospital with concern for  bowel obstruction.  CT chest showed lung nodules.  Patient was managed conservatively and discharged home.    6. May 06, 2018.  Patient underwent CT-guided biopsy of lung lesion.  Pathology showed well-differentiated adenocarcinoma.  Section shows well-differentiated adenocarcinoma which positive for CK 7 and TTF 1, negative for P 63 and CK 20.     7. June 23, 2018. PET scan showed Hypermetabolic right lower lobe pulmonary mass compatible with malignancy.  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 Hepatomegaly and steatosis.      SUBJECTIVE:  67 y.o. female with history of stage III colon cancer status post 5 cycles of FOLFOX now presenting with stage III lung cancer.  Patient was recently admitted to hospital with concern for bowel obstruction which was managed conservatively.  Also found to have lung nodules.  Initially patient refused biopsy but later as outpatient she underwent CT-guided biopsy.  Pathology is consistent with adenocarcinoma.  Patient today presents to discuss further treatment option.  Patient was seen by thoracic surgery and was deemed to a not a candidate for surgical resection.    REVIEW OF SYSTEMS:  GENERAL:  no fevers, chills  HEENT: no sore throat, congestion, blurry vision  Lungs: no shortness of breath, cough, wheezes at this time  GI: no nausea, vomiting, diarrhea, constipation  GU: No dysuria, urgency, increased frequency   Cardiac: no chest pains, palpitations, syncope,   Neuro: no weakness, loss of function, numbness, tingling  Skin: no rash  Musculoskeletal: No myalgias  Psychosocial: No depression, anxiety  Hematologic: No easy bruising, abnormal bleeding  All other review ROS negative except those mentioned above.     PATIENT HISTORY:  Past Medical History:   Diagnosis Date   . A-fib (CMS HCC)    . Arthropathy, unspecified,  site unspecified    . Asthma    . Cancer (CMS HCC)     cervical   . Cataract    . Colon cancer (CMS [place]) 07/06/2014   . COPD (chronic obstructive pulmonary disease) (CMS HCC)    . Fistula    . HTN (hypertension)    . Hyperlipidemia    . Obesity    . Sleep apnea    . Ventral hernia          Past Surgical History:   Procedure Laterality Date   . ABCESS DRAINAGE     . BOWEL RESECTION     . CATARACT EXTRACTION     . HX CERVICAL CONE BIOPSY      . HX CESAREAN SECTION     . HX GASTRIC BYPASS      25 years ago   . [place] (x2)   . HX HYSTERECTOMY      late 1990s   . HX LAP BANDING      2013   . HX LAPAROTOMY      2013 to remove lap band         Family Medical  History:     Problem Relation (Age of Onset)    Diabetes Mother, Maternal Grandmother, Maternal Grandfather, Father    Heart Attack Mother    Stroke Father            Current Outpatient Medications   Medication Sig   . apixaban (ELIQUIS) 5 mg Oral Tablet Take 1 Tab (5 mg total) by mouth Twice daily   . atorvastatin (LIPITOR) 10 mg Oral Tablet Take 10 mg by mouth Once a day   . budesonide-formoterol (SYMBICORT) 160-4.5 mcg/actuation Inhalation HFA Aerosol Inhaler Take 2 Puffs by inhalation Twice daily   . CARVEDILOL ORAL Take 6.25 mg by mouth Every evening    . dilTIAZem (CARDIZEM CD) 120 mg Oral Capsule, Sust. Release 24 hr Take 120 mg by mouth Once a day   . docusate sodium (COLACE) 100 mg Oral Capsule Take 100 mg by mouth Three times a day   . DULoxetine (CYMBALTA DR) 30 mg Oral Capsule, Delayed Release(E.C.) Take 30 mg by mouth Once a day   . flecainide (TAMBOCOR) 50 mg Oral Tablet Take 1 Tab (50 mg total) by mouth Twice daily   . linaCLOtide (LINZESS) 72 mcg Oral Capsule Take 72 mcg by mouth Every morning   . lisinopril (PRINIVIL) 5 mg Oral Tablet Take 1 Tab (5 mg total) by mouth Once a day   . loperamide (IMODIUM) 2 mg Oral Capsule Take 2 mg by mouth Every 4 hours as needed   . omeprazole (PRILOSEC) 20 mg Oral Capsule, Delayed Release(E.C.) Take 40 mg by mouth Once a day    . potassium chloride (K-DUR) 20 mEq Oral Tab Sust.Rel. Particle/Crystal Take 20 mEq by mouth Once a day   . prochlorperazine (COMPAZINE) 10 mg Oral Tablet Take 1 Tab (10 mg total) by mouth Four times a day as needed for nausea/vomiting   . traZODone (DESYREL) 100 mg Oral Tablet Take 100 mg by mouth Every night     Social History     Socioeconomic History   . Marital status: Married     Spouse name: Not on file   . Number of children: Not on file   . Years of education: Not on file   . Highest education level: Not on file   Tobacco Use   .  Smoking status: Former Research scientist (life sciences)   . Smokeless tobacco: Never Used   . Tobacco comment: quit 10 yrs ago    Substance and Sexual Activity   . Alcohol use: Not Currently     Comment: rarely   . Drug use: No   Other Topics Concern   . Ability to Walk 1 Flight of Steps without SOB/CP No   . Ability To Do Own ADL's Yes       PHYSICAL EXAMINATION:  VITALS: BP (!) 163/68   Pulse 54   Temp 35.6 C (96 F)   Ht 1.575 m (5' 2" )   Wt 129.7 kg (286 lb)   SpO2 97%   BMI 52.31 kg/m         PHYSICAL EXAM:   Consitutional: No acute distress.    Eyes: EOMI. No discharge. No Jaundice.   ENT: mucous membranes dry. No posterior pharynx lesions.. Neck supple, No palpable masses   Heme/ Lymph: No Cervical, Inguinal, axillary lymh nodes. No Bruising.   Cardiovascular: S1, S2, No murmurs, rubs, or gallops.   Respiratory: Clear to auscultate and percuss B/L, No rhonchi, crackles, or wheezing.   Abdomen: Normal Bowel Sounds, nontender nondistended. No hepatosplenomegaly.   Musculoskeletal: No Edema to the extremities.   Skin: Normal turgor. No Rashes,skin lesions   Psychiatry: Normal Affect   Neuro: No focal deficits. Alert and Oriented x 3      ENCOUNTER ORDERS:  Orders Placed This Encounter   . CT BRAIN WO IV CONTRAST   . Refer to [place]       LABORATORY/RADIOLOGICAL DATA: All pertinent labs/radiology were reviewed.     ASSESSMENT AND PLAN:  Encounter Diagnoses   Name Primary?   . Malignant neoplasm of lung, unspecified laterality, unspecified part of lung (CMS HCC) Yes   . Malignant neoplasm of unspecified part of right bronchus or lung (CMS [place])     67 y.o. female with history of stage III colon cancer status post 5 cycle of FOLFOX now presenting with stage III lung cancer.  I explained the reason for referred to Hematology/Oncology Clinic.      Adenocarcinoma lung:  Stage III.   - will order CT brain to rule out metastatic disease.  - will referred to Radiation Oncology to establish care.  - port in place.  - will treat with chemoradiation carboplatin/Taxol along with radiation.  After completion of chemo radiation  patient will get PET scan.  If continued to have good response will start maintenance immunotherapy for 1 year.  - follow-up in 2 weeks to start treatment.    Shared decision.  I discussed diagnosis, prognosis and treatment option with patient and family.  I had detailed discussion with patient regarding chemotherapy and radiation.  I also discussed brain scan to rule out metastatic disease before starting treatment.  I counseled patient regarding chemotherapy side effects and their management.  Patient understand and want to proceed with treatment.      Vanetta Mulders, MD  Hematology/Oncology

## 2018-06-30 ENCOUNTER — Encounter (HOSPITAL_COMMUNITY): Payer: Self-pay | Admitting: Internal Medicine

## 2018-06-30 NOTE — Nursing Note (Signed)
CT Brain W/WO IV contrast scheduled on 07/06/18 for 12:00 pm @ [facility]. Instructed patient to arrive 15-30 minutes prior to test to register. Confirmed appointment date/time with patient verbally. Mailed order to patient. Marquis Buggy, MA  06/30/2018, 14:42

## 2018-07-01 ENCOUNTER — Other Ambulatory Visit (HOSPITAL_COMMUNITY): Payer: Medicare Other

## 2018-07-02 ENCOUNTER — Inpatient Hospital Stay (HOSPITAL_COMMUNITY)
Admission: RE | Admit: 2018-07-02 | Discharge: 2018-07-02 | Disposition: A | Discharge status: Still In Hospital | Payer: Medicare Other | Source: Ambulatory Visit | Attending: Radiation Oncology | Admitting: Radiation Oncology

## 2018-07-02 ENCOUNTER — Other Ambulatory Visit (HOSPITAL_COMMUNITY): Payer: Self-pay | Admitting: Radiation Oncology

## 2018-07-02 DIAGNOSIS — C3431 Malignant neoplasm of lower lobe, right bronchus or lung: Secondary | ICD-10-CM | POA: Diagnosis not present

## 2018-07-02 DIAGNOSIS — Z5111 Encounter for antineoplastic chemotherapy: Secondary | ICD-10-CM | POA: Diagnosis not present

## 2018-07-02 DIAGNOSIS — Z9884 Bariatric surgery status: Secondary | ICD-10-CM | POA: Diagnosis not present

## 2018-07-02 DIAGNOSIS — Z510 Encounter for antineoplastic radiation therapy: Secondary | ICD-10-CM | POA: Diagnosis not present

## 2018-07-02 DIAGNOSIS — Z87891 Personal history of nicotine dependence: Secondary | ICD-10-CM | POA: Diagnosis not present

## 2018-07-03 DIAGNOSIS — Z87891 Personal history of nicotine dependence: Secondary | ICD-10-CM | POA: Diagnosis not present

## 2018-07-03 DIAGNOSIS — C3431 Malignant neoplasm of lower lobe, right bronchus or lung: Secondary | ICD-10-CM | POA: Diagnosis not present

## 2018-07-03 DIAGNOSIS — Z9884 Bariatric surgery status: Secondary | ICD-10-CM | POA: Diagnosis not present

## 2018-07-03 DIAGNOSIS — Z510 Encounter for antineoplastic radiation therapy: Secondary | ICD-10-CM | POA: Diagnosis not present

## 2018-07-03 DIAGNOSIS — Z5111 Encounter for antineoplastic chemotherapy: Secondary | ICD-10-CM | POA: Diagnosis not present

## 2018-07-04 ENCOUNTER — Other Ambulatory Visit (HOSPITAL_COMMUNITY): Payer: Self-pay | Admitting: Internal Medicine

## 2018-07-04 DIAGNOSIS — C3490 Malignant neoplasm of unspecified part of unspecified bronchus or lung: Secondary | ICD-10-CM

## 2018-07-06 ENCOUNTER — Ambulatory Visit
Admission: RE | Admit: 2018-07-06 | Discharge: 2018-07-06 | Disposition: A | Discharge status: Home / Self Care | Payer: Medicare Other | Source: Ambulatory Visit | Attending: Internal Medicine | Admitting: Internal Medicine

## 2018-07-06 DIAGNOSIS — C3491 Malignant neoplasm of unspecified part of right bronchus or lung: Secondary | ICD-10-CM | POA: Diagnosis not present

## 2018-07-06 DIAGNOSIS — C3490 Malignant neoplasm of unspecified part of unspecified bronchus or lung: Secondary | ICD-10-CM

## 2018-07-07 ENCOUNTER — Telehealth (HOSPITAL_COMMUNITY): Payer: Self-pay | Admitting: Internal Medicine

## 2018-07-07 ENCOUNTER — Ambulatory Visit (HOSPITAL_COMMUNITY): Payer: Medicare Other

## 2018-07-07 ENCOUNTER — Inpatient Hospital Stay (HOSPITAL_COMMUNITY): Payer: Self-pay

## 2018-07-07 ENCOUNTER — Telehealth (HOSPITAL_COMMUNITY): Payer: Self-pay

## 2018-07-07 ENCOUNTER — Telehealth (HOSPITAL_COMMUNITY): Payer: Self-pay | Admitting: Nurse Practitioner

## 2018-07-07 ENCOUNTER — Encounter (HOSPITAL_COMMUNITY): Payer: Self-pay | Admitting: Internal Medicine

## 2018-07-07 NOTE — Telephone Encounter (Signed)
1021: Patient informed that CT of head negative. Patient verbalized understanding. She will contact [facility] when the car is fixed to get her transportation to and from treatments. No other issues. Herbie Drape, NP

## 2018-07-07 NOTE — Telephone Encounter (Signed)
Wants to talk to about results of brain scan done [phone]

## 2018-07-07 NOTE — Telephone Encounter (Signed)
Called to check on pt bc she cancelled her appt today. She states her car broke down and they don't know when it will be fixed and they live in [place]. She states she will call us when she is able to return. I will make a note to check on her soon.

## 2018-07-07 NOTE — Telephone Encounter (Signed)
Patient/Personal (having car problems will call to resch when she gets it fixed)

## 2018-07-08 ENCOUNTER — Telehealth (HOSPITAL_COMMUNITY): Payer: Self-pay

## 2018-07-08 NOTE — Telephone Encounter (Signed)
Patient had to cancel chemo/RT start on Monday 07/07/18 due to car break-down.  Rescheduling both chemo & RT for 07/23/18 - because her car won't be ready until 07/22/18.  RT notified.

## 2018-07-21 DIAGNOSIS — M2550 Pain in unspecified joint: Secondary | ICD-10-CM | POA: Diagnosis not present

## 2018-07-21 DIAGNOSIS — E785 Hyperlipidemia, unspecified: Secondary | ICD-10-CM | POA: Diagnosis not present

## 2018-07-23 ENCOUNTER — Encounter (HOSPITAL_COMMUNITY): Payer: Self-pay

## 2018-07-23 ENCOUNTER — Ambulatory Visit (HOSPITAL_BASED_OUTPATIENT_CLINIC_OR_DEPARTMENT_OTHER): Payer: Medicare Other | Admitting: Internal Medicine

## 2018-07-23 ENCOUNTER — Inpatient Hospital Stay (HOSPITAL_COMMUNITY)
Admission: RE | Admit: 2018-07-23 | Discharge: 2018-07-23 | Disposition: A | Discharge status: Still In Hospital | Payer: Medicare Other | Source: Ambulatory Visit | Attending: Radiation Oncology | Admitting: Radiation Oncology

## 2018-07-23 ENCOUNTER — Telehealth (HOSPITAL_COMMUNITY): Payer: Self-pay | Admitting: Internal Medicine

## 2018-07-23 ENCOUNTER — Ambulatory Visit (HOSPITAL_COMMUNITY)
Admission: RE | Admit: 2018-07-23 | Discharge: 2018-07-23 | Disposition: A | Discharge status: Home / Self Care | Payer: Medicare Other | Source: Ambulatory Visit

## 2018-07-23 ENCOUNTER — Encounter (HOSPITAL_COMMUNITY): Payer: Self-pay | Admitting: Internal Medicine

## 2018-07-23 VITALS — BP 133/76 | HR 61 | Temp 98.1°F | Resp 18

## 2018-07-23 VITALS — BP 113/66 | HR 59 | Temp 97.0°F | Ht 62.0 in | Wt 300.0 lb

## 2018-07-23 DIAGNOSIS — Z9884 Bariatric surgery status: Secondary | ICD-10-CM | POA: Diagnosis not present

## 2018-07-23 DIAGNOSIS — Z5111 Encounter for antineoplastic chemotherapy: Secondary | ICD-10-CM | POA: Diagnosis not present

## 2018-07-23 DIAGNOSIS — T451X5A Adverse effect of antineoplastic and immunosuppressive drugs, initial encounter: Secondary | ICD-10-CM | POA: Diagnosis not present

## 2018-07-23 DIAGNOSIS — C3431 Malignant neoplasm of lower lobe, right bronchus or lung: Secondary | ICD-10-CM | POA: Diagnosis not present

## 2018-07-23 DIAGNOSIS — R112 Nausea with vomiting, unspecified: Secondary | ICD-10-CM | POA: Diagnosis not present

## 2018-07-23 DIAGNOSIS — C3490 Malignant neoplasm of unspecified part of unspecified bronchus or lung: Secondary | ICD-10-CM | POA: Diagnosis not present

## 2018-07-23 DIAGNOSIS — Z87891 Personal history of nicotine dependence: Secondary | ICD-10-CM | POA: Diagnosis not present

## 2018-07-23 DIAGNOSIS — Z510 Encounter for antineoplastic radiation therapy: Secondary | ICD-10-CM | POA: Diagnosis not present

## 2018-07-23 LAB — CBC WITH DIFF
BASOPHIL #: 0.1 x10ˆ3/uL (ref 0.00–0.20)
BASOPHIL %: 1 %
EOSINOPHIL #: 0.6 x10ˆ3/uL — ABNORMAL HIGH (ref 0.00–0.50)
EOSINOPHIL %: 9 %
HCT: 40.9 % (ref 34.6–46.2)
HGB: 13.1 g/dL (ref 11.8–15.8)
LYMPHOCYTE #: 1.9 x10ˆ3/uL (ref 0.90–3.40)
LYMPHOCYTE %: 26 %
MCH: 25.6 pg — ABNORMAL LOW (ref 27.6–33.2)
MCHC: 32.1 g/dL — ABNORMAL LOW (ref 32.6–35.4)
MCV: 79.8 fL — ABNORMAL LOW (ref 82.3–96.7)
MONOCYTE #: 0.8 x10ˆ3/uL (ref 0.20–0.90)
MONOCYTE %: 11 %
MPV: 8.6 fL (ref 6.6–10.2)
NEUTROPHIL #: 3.9 x10ˆ3/uL (ref 1.50–6.40)
NEUTROPHIL %: 53 %
PLATELETS: 223 x10ˆ3/uL (ref 140–440)
RBC: 5.13 x10ˆ6/uL (ref 3.80–5.24)
RDW: 16.8 % — ABNORMAL HIGH (ref 12.4–15.2)
WBC: 7.4 x10ˆ3/uL (ref 3.5–10.3)

## 2018-07-23 LAB — COMPREHENSIVE METABOLIC PANEL, NON-FASTING
ALBUMIN: 3.8 g/dL (ref 3.2–4.6)
ALKALINE PHOSPHATASE: 82 U/L (ref 20–130)
ALT (SGPT): 73 U/L — ABNORMAL HIGH (ref <=52)
ANION GAP: 7 mmol/L
AST (SGOT): 57 U/L — ABNORMAL HIGH (ref <=35)
BILIRUBIN TOTAL: 0.6 mg/dL (ref 0.3–1.2)
BUN/CREA RATIO: 19
BUN: 17 mg/dL (ref 10–25)
CALCIUM: 9 mg/dL (ref 8.8–10.3)
CHLORIDE: 106 mmol/L (ref 98–111)
CO2 TOTAL: 25 mmol/L (ref 21–35)
CREATININE: 0.9 mg/dL (ref <=1.30)
ESTIMATED GFR: >60 mL/min/1.73mˆ2
ESTIMATED GFR: >60 mL/min/{1.73_m2}
GLUCOSE: 148 mg/dL — ABNORMAL HIGH (ref 70–110)
POTASSIUM: 3.9 mmol/L (ref 3.5–5.0)
PROTEIN TOTAL: 6.3 g/dL (ref 6.0–8.3)
SODIUM: 138 mmol/L (ref 135–145)

## 2018-07-23 MED ORDER — PROCHLORPERAZINE MALEATE 5 MG TABLET
5.00 mg | ORAL_TABLET | Freq: Four times a day (QID) | ORAL | 0 refills | Status: DC | PRN
Start: 2018-07-23 — End: 2018-08-07

## 2018-07-23 MED ORDER — SODIUM CHLORIDE 0.9 % INTRAVENOUS SOLUTION
20.0000 mg | Freq: Once | INTRAVENOUS | Status: AC
Start: 2018-07-23 — End: 2018-07-23
  Administered 2018-07-23: 11:00:00 20 mg via INTRAVENOUS
  Administered 2018-07-23: 0 mg via INTRAVENOUS
  Filled 2018-07-23: qty 5

## 2018-07-23 MED ORDER — PALONOSETRON 0.25 MG/5 ML INTRAVENOUS SOLUTION
0.2500 mg | Freq: Once | INTRAVENOUS | Status: AC
Start: 2018-07-23 — End: 2018-07-23
  Administered 2018-07-23: 0.25 mg via INTRAVENOUS
  Filled 2018-07-23: qty 5

## 2018-07-23 MED ORDER — SODIUM CHLORIDE 0.9 % INTRAVENOUS SOLUTION
45.0000 mg/m2 | Freq: Once | INTRAVENOUS | Status: AC
Start: 2018-07-23 — End: 2018-07-23
  Administered 2018-07-23: 0 mg via INTRAVENOUS
  Administered 2018-07-23: 108 mg via INTRAVENOUS
  Filled 2018-07-23: qty 18

## 2018-07-23 MED ORDER — DEXAMETHASONE 2 MG TABLET
2.0000 mg | ORAL_TABLET | Freq: Every day | ORAL | 0 refills | Status: DC
Start: 2018-07-23 — End: 2018-08-07

## 2018-07-23 MED ORDER — SODIUM CHLORIDE 0.9 % INTRAVENOUS SOLUTION
206.8000 mg | Freq: Once | INTRAVENOUS | Status: AC
Start: 2018-07-23 — End: 2018-07-23
  Administered 2018-07-23: 0 mg via INTRAVENOUS
  Administered 2018-07-23: 205 mg via INTRAVENOUS
  Filled 2018-07-23: qty 20.5

## 2018-07-23 MED ORDER — DEXTROSE 5% IN WATER (D5W) FLUSH BAG - 250 ML
INTRAVENOUS | Status: DC | PRN
Start: 2018-07-23 — End: 2018-07-24

## 2018-07-23 MED ORDER — SODIUM CHLORIDE 0.9 % INTRAVENOUS SOLUTION
Freq: Once | INTRAVENOUS | Status: AC
Start: 2018-07-23 — End: 2018-07-23
  Filled 2018-07-23: qty 2

## 2018-07-23 MED ORDER — DIPHENHYDRAMINE 50 MG/ML INJECTION SOLUTION
50.0000 mg | Freq: Once | INTRAMUSCULAR | Status: DC
Start: 2018-07-23 — End: 2018-07-23

## 2018-07-23 MED ORDER — SODIUM CHLORIDE 0.9% FLUSH BAG - 250 ML
INTRAVENOUS | Status: DC | PRN
Start: 2018-07-23 — End: 2018-07-24

## 2018-07-23 MED ORDER — FAMOTIDINE (PF) 20 MG/2 ML INTRAVENOUS SOLUTION
20.0000 mg | Freq: Once | INTRAVENOUS | Status: DC
Start: 2018-07-23 — End: 2018-07-23

## 2018-07-23 MED ADMIN — SURGIFOAM/ CIPRODEX MIXTURE: @ 10:00:00 | NDC 63713001975

## 2018-07-23 MED ADMIN — hydrocortisone 0.5 % topical cream: INTRAVENOUS | @ 13:00:00 | NDC 00168001431

## 2018-07-23 MED ADMIN — sodium chloride 0.9 % (flush) injection syringe: INTRAVENOUS | @ 11:00:00

## 2018-07-23 NOTE — Progress Notes (Signed)
[place]  [place] [id number]        Encounter Date: 07/23/2018   9:15 AM EST    Name:  Kelsey [name]  Age: 67 y.o.  DOB: [birth year]  Sex: female  PCP: Benjie Karvonen    Chief Complaint:  No chief complaint on file.        HISTORY OF PRESENT ILLNESS:   67 y.o. female with history of stage III colon cancer of present evaluation and management of lung cancer.    1. September 2015. Patient underwent sigmoidoscopy and was found to have polyp which was adenocarcinoma.    2. June 10, 2014. She was admitted for segmental resection of sigmoid colon. This revealed pT3, N1a, MX, low-grade  adenocarcinoma of the colon. The mass measuring 4 x 3 x 0.5 centimeters. It  was low-grade and there was no evidence of microsatellite instability by  histology. Margins were uninvolved and 4 lymph nodes only were removed which  only 1 was involved. This corresponding stage IIIB, T3, N1a, M0, low-grade  sigmoid cancer.   3. July 07, 2014. Recommended adjuvant chemotherapy with FOLFOX.    4. July 27, 2014. Patient was started on adjuvant chemotherapy with FOLFOX.  Patient received only 5 treatments and chemotherapy was stopped due to poor tolerance.  After that patient lost follow-up.      5. October 2019. Patient was admitted to hospital with concern for  bowel obstruction.  CT chest showed lung nodules.  Patient was managed conservatively and discharged home.    6. May 06, 2018.  Patient underwent CT-guided biopsy of lung lesion.  Pathology showed well-differentiated adenocarcinoma.  Section shows well-differentiated adenocarcinoma which positive for CK 7 and TTF 1, negative for P 63 and CK 20.     7. June 23, 2018. PET scan showed Hypermetabolic right lower lobe pulmonary mass compatible with malignancy.  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 Hepatomegaly and steatosis.     8. July 23, 2018. Patient was started on chemoradiation.    SUBJECTIVE:  Patient today presents for follow-up in treatment.  Denies any new issues since last visit.    REVIEW OF SYSTEMS:  GENERAL:  no fevers, chills  HEENT: no sore throat, congestion, blurry vision  Lungs: no shortness of breath, cough, wheezes at this time  GI: no nausea, vomiting, diarrhea, constipation  GU: No dysuria, urgency, increased frequency   Cardiac: no chest pains, palpitations, syncope,   Neuro: no weakness, loss of function, numbness, tingling  Skin: no rash  Musculoskeletal: No myalgias  Psychosocial: No depression, anxiety  Hematologic: No easy bruising, abnormal bleeding  All other review ROS negative except those mentioned above.     PATIENT HISTORY:  Past Medical History:   Diagnosis Date   . A-fib (CMS HCC)    . Arthropathy, unspecified, site unspecified    . Asthma    . Cancer (CMS HCC)     cervical   . Cataract    . Colon cancer (CMS [place]) 07/06/2014   . COPD (chronic obstructive pulmonary disease) (CMS HCC)    . Fistula    . HTN (hypertension)    . Hyperlipidemia    . Obesity    .  Sleep apnea    . Ventral hernia          Past Surgical History:   Procedure Laterality Date   . ABCESS DRAINAGE     . BOWEL RESECTION     . CATARACT EXTRACTION     . HX CERVICAL CONE BIOPSY      . HX CESAREAN SECTION     . HX GASTRIC BYPASS      25 years ago   . [place] (x2)   . HX HYSTERECTOMY      late 1990s   . HX LAP BANDING      2013   . HX LAPAROTOMY      2013 to remove lap band         Family Medical History:     Problem Relation (Age of Onset)    Diabetes Mother, Maternal Grandmother, Maternal Grandfather, Father    Heart Attack Mother    Stroke Father            Current Outpatient Medications   Medication Sig   . apixaban (ELIQUIS) 5 mg Oral Tablet Take 1 Tab (5 mg total) by mouth Twice daily   . atorvastatin (LIPITOR) 10 mg Oral Tablet Take 10 mg by mouth Once a day   . budesonide-formoterol (SYMBICORT) 160-4.5 mcg/actuation  Inhalation HFA Aerosol Inhaler Take 2 Puffs by inhalation Twice daily   . CARVEDILOL ORAL Take 6.25 mg by mouth Every evening    . dexAMETHasone (DECADRON) 2 mg Oral Tablet Take 1 Tab (2 mg total) by mouth Once a day   . dilTIAZem (CARDIZEM CD) 120 mg Oral Capsule, Sust. Release 24 hr Take 120 mg by mouth Once a day   . docusate sodium (COLACE) 100 mg Oral Capsule Take 100 mg by mouth Three times a day   . DULoxetine (CYMBALTA DR) 30 mg Oral Capsule, Delayed Release(E.C.) Take 30 mg by mouth Once a day   . flecainide (TAMBOCOR) 50 mg Oral Tablet Take 1 Tab (50 mg total) by mouth Twice daily   . linaCLOtide (LINZESS) 72 mcg Oral Capsule Take 72 mcg by mouth Every morning   . lisinopril (PRINIVIL) 5 mg Oral Tablet Take 1 Tab (5 mg total) by mouth Once a day   . loperamide (IMODIUM) 2 mg Oral Capsule Take 2 mg by mouth Every 4 hours as needed   . omeprazole (PRILOSEC) 20 mg Oral Capsule, Delayed Release(E.C.) Take 40 mg by mouth Once a day    . potassium chloride (K-DUR) 20 mEq Oral Tab Sust.Rel. Particle/Crystal Take 20 mEq by mouth Once a day   . prochlorperazine (COMPAZINE) 10 mg Oral Tablet Take 1 Tab (10 mg total) by mouth Four times a day as needed for nausea/vomiting   . prochlorperazine (COMPAZINE) 5 mg Oral Tablet Take 1 Tab (5 mg total) by mouth Four times a day as needed for nausea/vomiting   . traZODone (DESYREL) 100 mg Oral Tablet Take 100 mg by mouth Every night     Social History     Socioeconomic History   . Marital status: Married     Spouse name: Not on file   . Number of children: Not on file   . Years of education: Not on file   . Highest education level: Not on file   Tobacco Use   . Smoking status: Former Research scientist (life sciences)   . Smokeless tobacco: Never Used   . Tobacco comment: quit 10 yrs ago  Substance and Sexual Activity   . Alcohol use: Not Currently     Comment: rarely   . Drug use: No   Other Topics Concern   . Ability to Walk 1 Flight of Steps without SOB/CP No   . Ability To Do Own ADL's Yes              PHYSICAL EXAMINATION:  VITALS: BP 113/66   Pulse 59   Temp 36.1 C (97 F)   Ht 1.575 m (5' 2" )   Wt 136.1 kg (300 lb)   SpO2 97%   BMI 54.87 kg/m         PHYSICAL EXAM:   Consitutional: No acute distress.    Eyes: EOMI. No discharge. No Jaundice.   ENT: mucous membranes dry. No posterior pharynx lesions.. Neck supple, No palpable masses   Heme/ Lymph: No Cervical, Inguinal, axillary lymh nodes. No Bruising.   Cardiovascular: S1, S2, No murmurs, rubs, or gallops.   Respiratory: Clear to auscultate and percuss B/L, No rhonchi, crackles, or wheezing.   Abdomen: Normal Bowel Sounds, nontender nondistended. No hepatosplenomegaly.   Musculoskeletal: No Edema to the extremities.   Skin: Normal turgor. No Rashes,skin lesions   Psychiatry: Normal Affect   Neuro: No focal deficits. Alert and Oriented x 3      ENCOUNTER ORDERS:  Orders Placed This Encounter   . prochlorperazine (COMPAZINE) 5 mg Oral Tablet   . dexAMETHasone (DECADRON) 2 mg Oral Tablet       LABORATORY/RADIOLOGICAL DATA: All pertinent labs/radiology were reviewed.     ASSESSMENT AND PLAN:  Encounter Diagnoses   Name Primary?   . Malignant neoplasm of lung, unspecified laterality, unspecified part of lung (CMS HCC) Yes   . Encounter for antineoplastic chemotherapy    68 y.o. female with history of stage III colon cancer status post 5 cycle of FOLFOX now presenting with stage III lung cancer.  I explained the reason for referred to Hematology/Oncology Clinic.      Adenocarcinoma lung:  Stage III.   - CT brain to rule out metastatic disease.  NEGATIVE  - Port in place.  - will treat with chemoradiation carboplatin/Taxol along with radiation.  After completion of chemo radiation patient will get PET scan.  If continued to have good response will start maintenance immunotherapy for 1 year.          CHEMOTHERAPY:/RADIATION  CYCLE DAY   DRUG DOSE TOTAL DOSE % ROUTE SCHEDULE   Carboplatin AUC- 2 205 mg    Weekly    Paclitaxel 45 mg/m2 108 mg    Weekly                       Shared decision.  I discussed diagnosis, prognosis and treatment option with patient and family.  I had detailed discussion with patient regarding chemotherapy and radiation.  I also discussed brain scan to rule out metastatic disease before starting treatment.  I counseled patient regarding chemotherapy side effects and their management.  Patient understand and want to proceed with treatment.      CINV  - Zofran PRN    Vanetta Mulders, MD  Hematology/Oncology

## 2018-07-23 NOTE — Nursing Note (Signed)
Navigator: INITIAL NAVIGATION: Met with patient/family and introduced self and navigation role. Binder provided and contents reviewed with patient/family. Binder includes:    Cancer patient resources    A blank calendar    OCM information sheet on estimated "Out of Pocket" costs    Advanced Directives Booklet    Caregiver burnout handout/resource    Disease-specific education on __Non Small Cell Lung Cancer___ (Patient Resource, ACS, NCI)    Treatment literature:   o Literature printed from [employer] on __Taxol, Carboplatin___   o Side-effect management   o Infection control handout    After hours phone number & instructions   Barriers to care reviewed:    Transportation: family [employer] application filled out prior to today. 2 gas cards a week given. They were denied by [employer].    Medication Coverage: Medicare    Physical Needs: up with assistance    Understanding of treatment plan: Verbalized understanding.   Nurse Navigator Initial Assessment    Assessment completed with:  patient, spouse or significant other  Psychosocial:    Mood:  calm  Previous PHQ-9 Results:  PHQ 9 Follow Up Questionnaire   PHQ Questionnaire  Little interest or pleasure in doing things.: Not at all  Feeling down, depressed, or hopeless: Not at all  PHQ 2 Total: 0  Trouble falling or staying asleep, or sleeping too much.: Not at all  Feeling tired or having little energy: Not at all  Poor appetite or overeating: Not at all  Feeling bad about yourself/ that you are a failure in the past 2 weeks?: Not at all  Trouble concentrating on things in the past 2 weeks?: Not at all  Moving/Speaking slowly or being fidgety or restless  in the past 2 weeks?: Not at all  Thoughts that you would be better off DEAD, or of hurting yourself in some way.: Not at all  PHQ 9 Total: 0  Interpretation of Total Score: 0-4 No depression  History of:     Currently undergoing treatment for depression/anxiety?:  No  Access to support system?:   Yes  Support system:  family, friends, spouse or partner  Adequate support to meet needs?:  Yes  Lack of coping?:  No  Any social problems?:  No  Recent stressful life event?:  No  Family Conflict?:  No  Spiritual/Cultural:    Any spiritual needs?:  No  Would the patient like to see a clergy or chaplain while in clinic?:  No  Does the patient need any special accomodations due to religious or cultural beliefs?:  No  Living Situation:    Patient lives:  spouse or partner  Type of Residence:  house  Patient has a telephone?:  Yes  Lack of electricity?:  No  Lack of running water?:  No  Patient plans to return home after treatment?:  Yes  Patient needs home care services?:  No  Bed or wheelchair confined?:  No  Transportation:    Transportation means:  family, car  Transportation issues?:  Yes  Patient in need of courtesy room?:  Yes (Comment: None available at this time. )  Financial:    Expressed health insurance needs?:  No  Has active pharmacy benefits?:  Yes  Prescription copays affordable?:  Yes  Inadequate resources to meet basic needs?:  No  Medications/Chemo:    Medication adherance problem?:  No  Experiencing side effects from current medications/chemo?:  No  Barriers to Care:    Physical:    Respiratory:  Shortness of breath?:  No  Use supplemental oxygen?:  No  Tobacco use:  Non-smoker  Nutrition/Metabolic:    Diet:  regular  Eating habits:  poor appetite  Recent weight loss or gain?:  No  Has a G-tube?:  No  Gastrointestinal:    Sore Throat?:  No  Nausea?:  No  Vomitting?:  No  Diarrhea?:  No  Constipation?:  No  Ostomy?:  No  Genitourinary:    Nephrostomy?:  No  Neurologic:    Any neurologic/sleep problems?:  No  Muscular/Skeletal:    Ambulation aides:  cane, wheelchair  Changes in level of strength?:  No  Skin:    Rash?:  No  Sore?:  No  Wound?:  No  Redness?:  No  Mouth sores?:  No  Radiation Dermatitis?:  No  Referrals/Checklists:    Referrals:    Referral to [place] counselor recommended?:   No  Referral to Dietician recommended?:  No  Referral for Genetic Testing recommended?:  No  Referral to Home health recommended?:  No  Referral to Hospice recommended?:  No  Referral to PT/OT recommended?:  No  Refferal for Prosthesis recommended?:  No  Referral to [facility] program recommended?:  Yes  Patient given coupons for ensure?:  No  Education:    Binder reviewed?:  Yes  Chemo information sheets reviewed?:  Yes  Symptom management instructions given?:  Yes  Patient teaching initiated?:  Yes  Caregiver burnout education provided?:  Yes

## 2018-07-23 NOTE — Telephone Encounter (Signed)
Patient stopped by the front desk to pick up papers on your letter head to [facility] about her having treatment here. Please let her know when she can pick up  [phone].  Kelsey [name]

## 2018-07-23 NOTE — Nurses Notes (Addendum)
1026-Port flushed with NS 87ml with +BR. Pepcid/benadryl started at a 15 minute rate.Ovid Curd, RN  1046-Infusion complete. Port flushed with NS 20ml with +BR. Decadron started at a 15 minute rate.Lilyan Gilford, RN  1104-Infusion complete. Port flushed with NS 65ml with +BR. Aloxi given slow IVP.Ovid Curd, RN  1109-Port flushed with NS 36ml with +BR. Taxol started at a 1 hour rate.Ovid Curd, RN  1124-Denies complaints.BP (!) 142/71   Pulse 60   Temp 36.7 C (98 F)   Resp 18   SpO2 96% Will continue to monitor.Ovid Curd, RN  1139-Denies complaints.BP 138/75   Pulse 61   Temp 36.7 C (98.1 F)   Resp 18   SpO2 96% Will continue to monitor.Ovid Curd, RN  1228-Infusion complete.BP 133/76   Pulse 61   Temp 36.7 C (98.1 F)   Resp 18   SpO2 96%    Port flushed with NS 36ml with +BR. Carboplatin started at a 30 minute rate.Ovid Curd, RN  1305-Infusion complete. Port flushed with NS 41ml with +BR and heparin flush 82ml and de-accessed with band-aid to healthy site. Pt left in wheelchair with family.Ovid Curd, RN

## 2018-07-24 ENCOUNTER — Inpatient Hospital Stay (HOSPITAL_COMMUNITY)
Admission: RE | Admit: 2018-07-24 | Discharge: 2018-07-24 | Disposition: A | Discharge status: Still In Hospital | Payer: Medicare Other | Source: Ambulatory Visit

## 2018-07-24 DIAGNOSIS — Z510 Encounter for antineoplastic radiation therapy: Secondary | ICD-10-CM | POA: Diagnosis not present

## 2018-07-24 DIAGNOSIS — Z5111 Encounter for antineoplastic chemotherapy: Secondary | ICD-10-CM | POA: Diagnosis not present

## 2018-07-24 DIAGNOSIS — Z9884 Bariatric surgery status: Secondary | ICD-10-CM | POA: Diagnosis not present

## 2018-07-24 DIAGNOSIS — C3431 Malignant neoplasm of lower lobe, right bronchus or lung: Secondary | ICD-10-CM | POA: Diagnosis not present

## 2018-07-25 ENCOUNTER — Inpatient Hospital Stay
Admission: RE | Admit: 2018-07-25 | Discharge: 2018-07-25 | Disposition: A | Discharge status: Still In Hospital | Payer: Medicare Other | Source: Ambulatory Visit | Attending: Radiation Oncology | Admitting: Radiation Oncology

## 2018-07-25 DIAGNOSIS — M25569 Pain in unspecified knee: Secondary | ICD-10-CM | POA: Diagnosis not present

## 2018-07-25 DIAGNOSIS — Z510 Encounter for antineoplastic radiation therapy: Secondary | ICD-10-CM | POA: Diagnosis not present

## 2018-07-25 DIAGNOSIS — R918 Other nonspecific abnormal finding of lung field: Secondary | ICD-10-CM | POA: Diagnosis not present

## 2018-07-25 DIAGNOSIS — Z9884 Bariatric surgery status: Secondary | ICD-10-CM | POA: Diagnosis not present

## 2018-07-25 DIAGNOSIS — Z5111 Encounter for antineoplastic chemotherapy: Secondary | ICD-10-CM | POA: Diagnosis not present

## 2018-07-25 DIAGNOSIS — C3431 Malignant neoplasm of lower lobe, right bronchus or lung: Secondary | ICD-10-CM | POA: Diagnosis not present

## 2018-07-28 ENCOUNTER — Inpatient Hospital Stay (HOSPITAL_COMMUNITY)
Admission: RE | Admit: 2018-07-28 | Discharge: 2018-07-28 | Disposition: A | Discharge status: Still In Hospital | Payer: Medicare Other | Source: Ambulatory Visit

## 2018-07-28 DIAGNOSIS — R112 Nausea with vomiting, unspecified: Secondary | ICD-10-CM | POA: Diagnosis not present

## 2018-07-28 DIAGNOSIS — E785 Hyperlipidemia, unspecified: Secondary | ICD-10-CM | POA: Diagnosis not present

## 2018-07-28 DIAGNOSIS — Z7982 Long term (current) use of aspirin: Secondary | ICD-10-CM | POA: Diagnosis not present

## 2018-07-28 DIAGNOSIS — M1990 Unspecified osteoarthritis, unspecified site: Secondary | ICD-10-CM | POA: Diagnosis not present

## 2018-07-28 DIAGNOSIS — C3431 Malignant neoplasm of lower lobe, right bronchus or lung: Secondary | ICD-10-CM | POA: Diagnosis not present

## 2018-07-28 DIAGNOSIS — Z79899 Other long term (current) drug therapy: Secondary | ICD-10-CM | POA: Diagnosis not present

## 2018-07-28 DIAGNOSIS — Z85038 Personal history of other malignant neoplasm of large intestine: Secondary | ICD-10-CM | POA: Diagnosis not present

## 2018-07-28 DIAGNOSIS — J449 Chronic obstructive pulmonary disease, unspecified: Secondary | ICD-10-CM | POA: Diagnosis not present

## 2018-07-28 DIAGNOSIS — Z7901 Long term (current) use of anticoagulants: Secondary | ICD-10-CM | POA: Diagnosis not present

## 2018-07-28 DIAGNOSIS — Z87891 Personal history of nicotine dependence: Secondary | ICD-10-CM | POA: Diagnosis not present

## 2018-07-28 DIAGNOSIS — T451X5A Adverse effect of antineoplastic and immunosuppressive drugs, initial encounter: Secondary | ICD-10-CM | POA: Diagnosis not present

## 2018-07-28 DIAGNOSIS — J45909 Unspecified asthma, uncomplicated: Secondary | ICD-10-CM | POA: Diagnosis not present

## 2018-07-28 DIAGNOSIS — Z7951 Long term (current) use of inhaled steroids: Secondary | ICD-10-CM | POA: Diagnosis not present

## 2018-07-28 DIAGNOSIS — I10 Essential (primary) hypertension: Secondary | ICD-10-CM | POA: Diagnosis not present

## 2018-07-28 DIAGNOSIS — Z8541 Personal history of malignant neoplasm of cervix uteri: Secondary | ICD-10-CM | POA: Diagnosis not present

## 2018-07-28 DIAGNOSIS — Z5111 Encounter for antineoplastic chemotherapy: Secondary | ICD-10-CM | POA: Diagnosis not present

## 2018-07-28 DIAGNOSIS — I4891 Unspecified atrial fibrillation: Secondary | ICD-10-CM | POA: Diagnosis not present

## 2018-07-28 DIAGNOSIS — Z510 Encounter for antineoplastic radiation therapy: Secondary | ICD-10-CM | POA: Diagnosis not present

## 2018-07-28 DIAGNOSIS — D6959 Other secondary thrombocytopenia: Secondary | ICD-10-CM | POA: Diagnosis not present

## 2018-07-28 DIAGNOSIS — G4730 Sleep apnea, unspecified: Secondary | ICD-10-CM | POA: Diagnosis not present

## 2018-07-29 ENCOUNTER — Inpatient Hospital Stay (HOSPITAL_COMMUNITY)
Admission: RE | Admit: 2018-07-29 | Discharge: 2018-07-29 | Disposition: A | Discharge status: Still In Hospital | Payer: Medicare Other | Source: Ambulatory Visit | Admitting: Radiation Oncology

## 2018-07-29 ENCOUNTER — Inpatient Hospital Stay (HOSPITAL_COMMUNITY)
Admission: RE | Admit: 2018-07-29 | Discharge: 2018-07-29 | Disposition: A | Discharge status: Still In Hospital | Payer: Medicare Other | Source: Ambulatory Visit

## 2018-07-29 ENCOUNTER — Encounter (HOSPITAL_COMMUNITY): Payer: Self-pay | Admitting: Internal Medicine

## 2018-07-29 DIAGNOSIS — R112 Nausea with vomiting, unspecified: Secondary | ICD-10-CM | POA: Diagnosis not present

## 2018-07-29 DIAGNOSIS — Z79899 Other long term (current) drug therapy: Secondary | ICD-10-CM | POA: Diagnosis not present

## 2018-07-29 DIAGNOSIS — T451X5A Adverse effect of antineoplastic and immunosuppressive drugs, initial encounter: Secondary | ICD-10-CM | POA: Diagnosis not present

## 2018-07-29 DIAGNOSIS — Z7982 Long term (current) use of aspirin: Secondary | ICD-10-CM | POA: Diagnosis not present

## 2018-07-29 DIAGNOSIS — Z8541 Personal history of malignant neoplasm of cervix uteri: Secondary | ICD-10-CM | POA: Diagnosis not present

## 2018-07-29 DIAGNOSIS — J449 Chronic obstructive pulmonary disease, unspecified: Secondary | ICD-10-CM | POA: Diagnosis not present

## 2018-07-29 DIAGNOSIS — D6959 Other secondary thrombocytopenia: Secondary | ICD-10-CM | POA: Diagnosis not present

## 2018-07-29 DIAGNOSIS — I10 Essential (primary) hypertension: Secondary | ICD-10-CM | POA: Diagnosis not present

## 2018-07-29 DIAGNOSIS — Z85038 Personal history of other malignant neoplasm of large intestine: Secondary | ICD-10-CM | POA: Diagnosis not present

## 2018-07-29 DIAGNOSIS — J45909 Unspecified asthma, uncomplicated: Secondary | ICD-10-CM | POA: Diagnosis not present

## 2018-07-29 DIAGNOSIS — C3431 Malignant neoplasm of lower lobe, right bronchus or lung: Secondary | ICD-10-CM | POA: Diagnosis not present

## 2018-07-29 DIAGNOSIS — Z87891 Personal history of nicotine dependence: Secondary | ICD-10-CM | POA: Diagnosis not present

## 2018-07-29 DIAGNOSIS — Z7901 Long term (current) use of anticoagulants: Secondary | ICD-10-CM | POA: Diagnosis not present

## 2018-07-29 DIAGNOSIS — Z5111 Encounter for antineoplastic chemotherapy: Secondary | ICD-10-CM | POA: Diagnosis not present

## 2018-07-29 DIAGNOSIS — Z7951 Long term (current) use of inhaled steroids: Secondary | ICD-10-CM | POA: Diagnosis not present

## 2018-07-29 DIAGNOSIS — G4730 Sleep apnea, unspecified: Secondary | ICD-10-CM | POA: Diagnosis not present

## 2018-07-29 DIAGNOSIS — M1990 Unspecified osteoarthritis, unspecified site: Secondary | ICD-10-CM | POA: Diagnosis not present

## 2018-07-29 DIAGNOSIS — E785 Hyperlipidemia, unspecified: Secondary | ICD-10-CM | POA: Diagnosis not present

## 2018-07-29 DIAGNOSIS — I4891 Unspecified atrial fibrillation: Secondary | ICD-10-CM | POA: Diagnosis not present

## 2018-07-29 DIAGNOSIS — Z510 Encounter for antineoplastic radiation therapy: Secondary | ICD-10-CM | POA: Diagnosis not present

## 2018-07-29 NOTE — Progress Notes (Signed)
S. At 900 cGy w chemo. No complaints.  Questions answered.

## 2018-07-29 NOTE — Nurses Notes (Signed)
Wt 288 lbs. Pt in room 2 for first OTV lung. Pt denies any complaints or changes to medication/allergies .Jimmye Norman, RN

## 2018-07-30 ENCOUNTER — Ambulatory Visit (HOSPITAL_BASED_OUTPATIENT_CLINIC_OR_DEPARTMENT_OTHER): Payer: Medicare Other | Admitting: Internal Medicine

## 2018-07-30 ENCOUNTER — Encounter (HOSPITAL_COMMUNITY): Payer: Self-pay | Admitting: Internal Medicine

## 2018-07-30 ENCOUNTER — Inpatient Hospital Stay (HOSPITAL_COMMUNITY)
Admission: RE | Admit: 2018-07-30 | Discharge: 2018-07-30 | Disposition: A | Discharge status: Still In Hospital | Payer: Medicare Other | Source: Ambulatory Visit

## 2018-07-30 ENCOUNTER — Ambulatory Visit (HOSPITAL_COMMUNITY)
Admission: RE | Admit: 2018-07-30 | Discharge: 2018-07-30 | Disposition: A | Discharge status: Home / Self Care | Payer: Medicare Other | Source: Ambulatory Visit

## 2018-07-30 VITALS — BP 98/56 | HR 67 | Temp 96.3°F | Ht 62.0 in | Wt 288.0 lb

## 2018-07-30 VITALS — BP 98/56 | HR 67 | Temp 96.3°F | Resp 18

## 2018-07-30 DIAGNOSIS — E785 Hyperlipidemia, unspecified: Secondary | ICD-10-CM | POA: Diagnosis not present

## 2018-07-30 DIAGNOSIS — Z79899 Other long term (current) drug therapy: Secondary | ICD-10-CM | POA: Diagnosis not present

## 2018-07-30 DIAGNOSIS — J449 Chronic obstructive pulmonary disease, unspecified: Secondary | ICD-10-CM | POA: Diagnosis not present

## 2018-07-30 DIAGNOSIS — I4891 Unspecified atrial fibrillation: Secondary | ICD-10-CM | POA: Diagnosis not present

## 2018-07-30 DIAGNOSIS — T451X5A Adverse effect of antineoplastic and immunosuppressive drugs, initial encounter: Secondary | ICD-10-CM | POA: Diagnosis not present

## 2018-07-30 DIAGNOSIS — C3490 Malignant neoplasm of unspecified part of unspecified bronchus or lung: Secondary | ICD-10-CM | POA: Diagnosis not present

## 2018-07-30 DIAGNOSIS — C3431 Malignant neoplasm of lower lobe, right bronchus or lung: Secondary | ICD-10-CM | POA: Diagnosis not present

## 2018-07-30 DIAGNOSIS — R740 Nonspecific elevation of levels of transaminase and lactic acid dehydrogenase [LDH]: Secondary | ICD-10-CM | POA: Diagnosis not present

## 2018-07-30 DIAGNOSIS — Z85038 Personal history of other malignant neoplasm of large intestine: Secondary | ICD-10-CM | POA: Diagnosis not present

## 2018-07-30 DIAGNOSIS — Z87891 Personal history of nicotine dependence: Secondary | ICD-10-CM | POA: Diagnosis not present

## 2018-07-30 DIAGNOSIS — Z7982 Long term (current) use of aspirin: Secondary | ICD-10-CM | POA: Diagnosis not present

## 2018-07-30 DIAGNOSIS — Z7951 Long term (current) use of inhaled steroids: Secondary | ICD-10-CM | POA: Diagnosis not present

## 2018-07-30 DIAGNOSIS — J45909 Unspecified asthma, uncomplicated: Secondary | ICD-10-CM | POA: Diagnosis not present

## 2018-07-30 DIAGNOSIS — G4730 Sleep apnea, unspecified: Secondary | ICD-10-CM | POA: Diagnosis not present

## 2018-07-30 DIAGNOSIS — Z510 Encounter for antineoplastic radiation therapy: Secondary | ICD-10-CM | POA: Diagnosis not present

## 2018-07-30 DIAGNOSIS — R112 Nausea with vomiting, unspecified: Secondary | ICD-10-CM | POA: Diagnosis not present

## 2018-07-30 DIAGNOSIS — Z8541 Personal history of malignant neoplasm of cervix uteri: Secondary | ICD-10-CM | POA: Diagnosis not present

## 2018-07-30 DIAGNOSIS — M1990 Unspecified osteoarthritis, unspecified site: Secondary | ICD-10-CM | POA: Diagnosis not present

## 2018-07-30 DIAGNOSIS — D6959 Other secondary thrombocytopenia: Secondary | ICD-10-CM | POA: Diagnosis not present

## 2018-07-30 DIAGNOSIS — I10 Essential (primary) hypertension: Secondary | ICD-10-CM | POA: Diagnosis not present

## 2018-07-30 DIAGNOSIS — Z7901 Long term (current) use of anticoagulants: Secondary | ICD-10-CM | POA: Diagnosis not present

## 2018-07-30 DIAGNOSIS — Z5111 Encounter for antineoplastic chemotherapy: Secondary | ICD-10-CM | POA: Diagnosis not present

## 2018-07-30 DIAGNOSIS — R7401 Elevation of levels of liver transaminase levels: Secondary | ICD-10-CM

## 2018-07-30 LAB — CBC WITH DIFF
BASOPHIL #: 0 10*3/uL (ref 0.00–0.20)
BASOPHIL %: 1 %
EOSINOPHIL #: 0.2 10*3/uL (ref 0.00–0.50)
EOSINOPHIL %: 6 %
HCT: 38.3 % (ref 34.6–46.2)
HCT: 38.3 % (ref 34.6–46.2)
HGB: 12.4 g/dL (ref 11.8–15.8)
LYMPHOCYTE #: 0.9 10*3/uL (ref 0.90–3.40)
LYMPHOCYTE %: 24 %
MCH: 25.8 pg — ABNORMAL LOW (ref 27.6–33.2)
MCHC: 32.3 g/dL — ABNORMAL LOW (ref 32.6–35.4)
MCV: 79.7 fL — ABNORMAL LOW (ref 82.3–96.7)
MONOCYTE #: 0.2 10*3/uL (ref 0.20–0.90)
MONOCYTE %: 5 %
MPV: 8.9 fL (ref 6.6–10.2)
NEUTROPHIL #: 2.3 10*3/uL (ref 1.50–6.40)
NEUTROPHIL %: 64 %
PLATELETS: 209 10*3/uL (ref 140–440)
RBC: 4.8 10*6/uL (ref 3.80–5.24)
RDW: 16.4 % — ABNORMAL HIGH (ref 12.4–15.2)
WBC: 3.6 10*3/uL (ref 3.5–10.3)

## 2018-07-30 LAB — COMPREHENSIVE METABOLIC PANEL, NON-FASTING
ALBUMIN: 3.5 g/dL (ref 3.2–4.6)
ALKALINE PHOSPHATASE: 90 U/L (ref 20–130)
ALT (SGPT): 117 U/L — ABNORMAL HIGH (ref <=52)
ANION GAP: 6 mmol/L
AST (SGOT): 66 U/L — ABNORMAL HIGH (ref <=35)
BILIRUBIN TOTAL: 0.8 mg/dL (ref 0.3–1.2)
BUN/CREA RATIO: 21
BUN: 18 mg/dL (ref 10–25)
CALCIUM: 8.6 mg/dL — ABNORMAL LOW (ref 8.8–10.3)
CHLORIDE: 106 mmol/L (ref 98–111)
CO2 TOTAL: 24 mmol/L (ref 21–35)
CREATININE: 0.87 mg/dL (ref <=1.30)
ESTIMATED GFR: >60 mL/min/{1.73_m2}
GLUCOSE: 168 mg/dL — ABNORMAL HIGH (ref 70–110)
POTASSIUM: 4.3 mmol/L (ref 3.5–5.0)
PROTEIN TOTAL: 5.7 g/dL — ABNORMAL LOW (ref 6.0–8.3)
SODIUM: 136 mmol/L (ref 135–145)

## 2018-07-30 LAB — HEPATITIS B SURFACE ANTIGEN: HBV SURFACE ANTIGEN QUALITATIVE: NEGATIVE

## 2018-07-30 LAB — HEPATITIS A (HAV) IGG ANTIBODY: HAV IGG ANTIBODY: NEGATIVE

## 2018-07-30 LAB — HEPATITIS B CORE ANTIBODY: HBV CORE TOTAL ANTIBODIES: NEGATIVE

## 2018-07-30 LAB — HEPATITIS C ANTIBODY SCREEN WITH REFLEX TO HCV PCR: HCV ANTIBODY QUALITATIVE: NEGATIVE

## 2018-07-30 LAB — HEPATITIS A (HAV) IGM ANTIBODY: HAV IGM: NEGATIVE

## 2018-07-30 LAB — HEPATITIS B SURFACE ANTIBODY: HBV SURFACE ANTIBODY QUANTITATIVE: 7 m[IU]/mL (ref <8)

## 2018-07-30 MED ORDER — SODIUM CHLORIDE 0.9 % INTRAVENOUS SOLUTION
45.0000 mg/m2 | Freq: Once | INTRAVENOUS | Status: AC
Start: 2018-07-30 — End: 2018-07-30
  Administered 2018-07-30: 0 mg via INTRAVENOUS
  Administered 2018-07-30: 108 mg via INTRAVENOUS
  Filled 2018-07-30: qty 18

## 2018-07-30 MED ORDER — FAMOTIDINE (PF) 20 MG/2 ML INTRAVENOUS SOLUTION
20.0000 mg | Freq: Once | INTRAVENOUS | Status: DC
Start: 2018-07-30 — End: 2018-07-30

## 2018-07-30 MED ORDER — PALONOSETRON 0.25 MG/5 ML INTRAVENOUS SOLUTION
0.2500 mg | Freq: Once | INTRAVENOUS | Status: AC
Start: 2018-07-30 — End: 2018-07-30
  Administered 2018-07-30: 11:00:00 0.25 mg via INTRAVENOUS
  Filled 2018-07-30: qty 5

## 2018-07-30 MED ORDER — SODIUM CHLORIDE 0.9 % INTRAVENOUS SOLUTION
Freq: Once | INTRAVENOUS | Status: AC
Start: 2018-07-30 — End: 2018-07-30
  Filled 2018-07-30: qty 2

## 2018-07-30 MED ORDER — SODIUM CHLORIDE 0.9 % INTRAVENOUS SOLUTION
20.0000 mg | Freq: Once | INTRAVENOUS | Status: AC
Start: 2018-07-30 — End: 2018-07-30
  Administered 2018-07-30: 20 mg via INTRAVENOUS
  Administered 2018-07-30: 0 mg via INTRAVENOUS
  Filled 2018-07-30: qty 5

## 2018-07-30 MED ORDER — DIPHENHYDRAMINE 50 MG/ML INJECTION SOLUTION
50.0000 mg | Freq: Once | INTRAMUSCULAR | Status: DC
Start: 2018-07-30 — End: 2018-07-30

## 2018-07-30 MED ORDER — SODIUM CHLORIDE 0.9 % INTRAVENOUS SOLUTION
212.2000 mg | Freq: Once | INTRAVENOUS | Status: AC
Start: 2018-07-30 — End: 2018-07-30
  Administered 2018-07-30: 0 mg via INTRAVENOUS
  Administered 2018-07-30: 13:00:00 210 mg via INTRAVENOUS
  Filled 2018-07-30: qty 21

## 2018-07-30 MED ADMIN — ADULT STANDARD CENTRAL PARENTERAL NUTRITION - ~~LOC~~: INTRAVENOUS | @ 11:00:00 | NDC 00338064406

## 2018-07-30 NOTE — Nurses Notes (Addendum)
1030-Port flushed with NS 6ml with +BR. Pepcid/benadryl started at a 15 minute rate.Ovid Curd, RN  1050 - Infusion complete. Port flushed with 10 ml NS, + BR.Karl Luke, RN  1055 - Aloxi given slow IV push. Port flushed with 10 ml NS, + BR.Karl Luke, RN  [phone] - Decadron started to infuse over a 15 minute rate.Karl Luke, RN  [phone] - Infusion complete. Port flushed with 10 ml NS, + BR. Taxol started to infuse over a one hour rate.Karl Luke, RN  1238-Infusion complete.Port flushed with NS 56ml with +BR. Carboplatin started at a 30 minute rate.Ovid Curd, RN  1318-Infusion complete. Port flushed with NS 79ml with +BR and heparin flush 16ml and de-accessed with band-aid to healthy site. Pt left in wheelchair with family.Ovid Curd, RN

## 2018-07-30 NOTE — Progress Notes (Signed)
[place]  [place] [id number]        Encounter Date: 07/30/2018   9:30 AM EST    Name:  Kelsey [name]  Age: 67 y.o.  DOB: 1950-11-01  Sex: female  PCP: Benjie Karvonen    Chief Complaint:  No chief complaint on file.        HISTORY OF PRESENT ILLNESS:   67 y.o. female with history of stage III colon cancer of present evaluation and management of lung cancer.    1. September 2015. Patient underwent sigmoidoscopy and was found to have polyp which was adenocarcinoma.    2. June 10, 2014. She was admitted for segmental resection of sigmoid colon. This revealed pT3, N1a, MX, low-grade  adenocarcinoma of the colon. The mass measuring 4 x 3 x 0.5 centimeters. It  was low-grade and there was no evidence of microsatellite instability by  histology. Margins were uninvolved and 4 lymph nodes only were removed which  only 1 was involved. This corresponding stage IIIB, T3, N1a, M0, low-grade  sigmoid cancer.   3. July 07, 2014. Recommended adjuvant chemotherapy with FOLFOX.    4. July 27, 2014. Patient was started on adjuvant chemotherapy with FOLFOX.  Patient received only 5 treatments and chemotherapy was stopped due to poor tolerance.  After that patient lost follow-up.      5. October 2019. Patient was admitted to hospital with concern for  bowel obstruction.  CT chest showed lung nodules.  Patient was managed conservatively and discharged home.    6. May 06, 2018.  Patient underwent CT-guided biopsy of lung lesion.  Pathology showed well-differentiated adenocarcinoma.  Section shows well-differentiated adenocarcinoma which positive for CK 7 and TTF 1, negative for P 63 and CK 20.     7. June 23, 2018. PET scan showed Hypermetabolic right lower lobe pulmonary mass compatible with malignancy.  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 Hepatomegaly and steatosis.     8. July 23, 2018. Patient was started on chemoradiation.    SUBJECTIVE:  Patient today presents for follow-up in treatment.  Denies any new issues since last visit.  Tolerated treatment well.  Denies any fevers or chills.    REVIEW OF SYSTEMS:  GENERAL:  no fevers, chills  HEENT: no sore throat, congestion, blurry vision  Lungs: no shortness of breath, cough, wheezes at this time  GI: no nausea, vomiting, diarrhea, constipation  GU: No dysuria, urgency, increased frequency   Cardiac: no chest pains, palpitations, syncope,   Neuro: no weakness, loss of function, numbness, tingling  Skin: no rash  Musculoskeletal: No myalgias  Psychosocial: No depression, anxiety  Hematologic: No easy bruising, abnormal bleeding  All other review ROS negative except those mentioned above.     PATIENT HISTORY:  Past Medical History:   Diagnosis Date   . A-fib (CMS HCC)    . Arthropathy, unspecified, site unspecified    . Asthma    . Cancer (CMS HCC)     cervical   . Cataract    . Colon cancer (CMS [place]) 07/06/2014   . COPD (chronic obstructive pulmonary disease) (CMS HCC)    . Fistula    . HTN (hypertension)    .  Hyperlipidemia    . Obesity    . Sleep apnea    . Ventral hernia          Past Surgical History:   Procedure Laterality Date   . ABCESS DRAINAGE     . BOWEL RESECTION     . CATARACT EXTRACTION     . HX CERVICAL CONE BIOPSY      . HX CESAREAN SECTION     . HX GASTRIC BYPASS      25 years ago   . [place] (x2)   . HX HYSTERECTOMY      late 1990s   . HX LAP BANDING      2013   . HX LAPAROTOMY      2013 to remove lap band         Family Medical History:     Problem Relation (Age of Onset)    Diabetes Mother, Maternal Grandmother, Maternal Grandfather, Father    Heart Attack Mother    Stroke Father            Current Outpatient Medications   Medication Sig   . apixaban (ELIQUIS) 5 mg Oral Tablet Take 1 Tab (5 mg total) by mouth Twice daily   . atorvastatin (LIPITOR) 10 mg Oral Tablet Take 10 mg by mouth Once a day   .  budesonide-formoterol (SYMBICORT) 160-4.5 mcg/actuation Inhalation HFA Aerosol Inhaler Take 2 Puffs by inhalation Twice daily   . CARVEDILOL ORAL Take 6.25 mg by mouth Every evening    . dexAMETHasone (DECADRON) 2 mg Oral Tablet Take 1 Tab (2 mg total) by mouth Once a day   . dilTIAZem (CARDIZEM CD) 120 mg Oral Capsule, Sust. Release 24 hr Take 120 mg by mouth Once a day   . docusate sodium (COLACE) 100 mg Oral Capsule Take 100 mg by mouth Three times a day   . DULoxetine (CYMBALTA DR) 30 mg Oral Capsule, Delayed Release(E.C.) Take 30 mg by mouth Once a day   . flecainide (TAMBOCOR) 50 mg Oral Tablet Take 1 Tab (50 mg total) by mouth Twice daily   . linaCLOtide (LINZESS) 72 mcg Oral Capsule Take 72 mcg by mouth Every morning   . lisinopril (PRINIVIL) 5 mg Oral Tablet Take 1 Tab (5 mg total) by mouth Once a day   . loperamide (IMODIUM) 2 mg Oral Capsule Take 2 mg by mouth Every 4 hours as needed   . omeprazole (PRILOSEC) 20 mg Oral Capsule, Delayed Release(E.C.) Take 40 mg by mouth Once a day    . potassium chloride (K-DUR) 20 mEq Oral Tab Sust.Rel. Particle/Crystal Take 20 mEq by mouth Once a day   . prochlorperazine (COMPAZINE) 10 mg Oral Tablet Take 1 Tab (10 mg total) by mouth Four times a day as needed for nausea/vomiting   . prochlorperazine (COMPAZINE) 5 mg Oral Tablet Take 1 Tab (5 mg total) by mouth Four times a day as needed for nausea/vomiting   . traZODone (DESYREL) 100 mg Oral Tablet Take 100 mg by mouth Every night     Social History     Socioeconomic History   . Marital status: Married     Spouse name: Not on file   . Number of children: Not on file   . Years of education: Not on file   . Highest education level: Not on file   Tobacco Use   . Smoking status: Former Research scientist (life sciences)   . Smokeless tobacco: Never  Used   . Tobacco comment: quit 10 yrs ago   Substance and Sexual Activity   . Alcohol use: Not Currently     Comment: rarely   . Drug use: No   Other Topics Concern   . Ability to Walk 1 Flight of Steps  without SOB/CP No   . Ability To Do Own ADL's Yes       PHYSICAL EXAMINATION:  VITALS: BP (!) 98/56   Pulse 67   Temp 35.7 C (96.3 F)   Ht 1.575 m (5' 2" )   Wt 130.6 kg (288 lb)   SpO2 95%   BMI 52.68 kg/m         PHYSICAL EXAM:   Consitutional: No acute distress.    Eyes: EOMI. No discharge. No Jaundice.   ENT: mucous membranes dry. No posterior pharynx lesions.. Neck supple, No palpable masses   Heme/ Lymph: No Cervical, Inguinal, axillary lymh nodes. No Bruising.   Cardiovascular: S1, S2, No murmurs, rubs, or gallops.   Respiratory: Clear to auscultate and percuss B/L, No rhonchi, crackles, or wheezing.   Abdomen: Normal Bowel Sounds, nontender nondistended. No hepatosplenomegaly.   Musculoskeletal: No Edema to the extremities.   Skin: Normal turgor. No Rashes,skin lesions   Psychiatry: Normal Affect   Neuro: No focal deficits. Alert and Oriented x 3      ENCOUNTER ORDERS:  Orders Placed This Encounter   . US LIVER   . HEPATITIS B SURFACE ANTIGEN   . HEPATITIS B SURFACE ANTIBODY   . HEPATITIS C ANTIBODY SCREEN WITH REFLEX TO HCV PCR       LABORATORY/RADIOLOGICAL DATA: All pertinent labs/radiology were reviewed.     ASSESSMENT AND PLAN:  Encounter Diagnoses   Name Primary?   . Transaminitis Yes   67 y.o. female with history of stage III colon cancer status post 5 cycle of FOLFOX now presenting with stage III lung cancer.  I explained the reason for referred to Hematology/Oncology Clinic.      Adenocarcinoma lung:  Stage III.   - CT brain to rule out metastatic disease.  NEGATIVE  - Port in place.  - Started on chemoradiation carboplatin/Taxol along with radiation- July 23, 2018.  After completion of chemo radiation patient will get PET scan.  If continued to have good response will start maintenance immunotherapy for 1 year.          CHEMOTHERAPY:/RADIATION  CYCLE DAY   DRUG DOSE TOTAL DOSE % ROUTE SCHEDULE   Carboplatin AUC- 2 205 mg    Weekly    Paclitaxel 45 mg/m2 108 mg    Weekly                         Shared decision.  I discussed diagnosis, prognosis and treatment option with patient and family.  I had detailed discussion with patient regarding chemotherapy and radiation.  I also discussed brain scan to rule out metastatic disease before starting treatment.  I counseled patient regarding chemotherapy side effects and their management.  Patient understand and want to proceed with treatment.      CINV  - Zofran PRN    Transaminitis:  - will send hepatitis serology  - will get ultrasound liver.  - recommended to cut down on Tylenol use.    Vanetta Mulders, MD  Hematology/Oncology

## 2018-07-31 ENCOUNTER — Ambulatory Visit (HOSPITAL_COMMUNITY): Payer: Medicare Other

## 2018-07-31 DIAGNOSIS — E785 Hyperlipidemia, unspecified: Secondary | ICD-10-CM | POA: Diagnosis not present

## 2018-07-31 DIAGNOSIS — T451X5A Adverse effect of antineoplastic and immunosuppressive drugs, initial encounter: Secondary | ICD-10-CM | POA: Diagnosis not present

## 2018-07-31 DIAGNOSIS — J449 Chronic obstructive pulmonary disease, unspecified: Secondary | ICD-10-CM | POA: Diagnosis not present

## 2018-07-31 DIAGNOSIS — D6959 Other secondary thrombocytopenia: Secondary | ICD-10-CM | POA: Diagnosis not present

## 2018-07-31 DIAGNOSIS — Z7982 Long term (current) use of aspirin: Secondary | ICD-10-CM | POA: Diagnosis not present

## 2018-07-31 DIAGNOSIS — I4891 Unspecified atrial fibrillation: Secondary | ICD-10-CM | POA: Diagnosis not present

## 2018-07-31 DIAGNOSIS — R112 Nausea with vomiting, unspecified: Secondary | ICD-10-CM | POA: Diagnosis not present

## 2018-07-31 DIAGNOSIS — J45909 Unspecified asthma, uncomplicated: Secondary | ICD-10-CM | POA: Diagnosis not present

## 2018-07-31 DIAGNOSIS — G4730 Sleep apnea, unspecified: Secondary | ICD-10-CM | POA: Diagnosis not present

## 2018-07-31 DIAGNOSIS — I10 Essential (primary) hypertension: Secondary | ICD-10-CM | POA: Diagnosis not present

## 2018-07-31 DIAGNOSIS — C3431 Malignant neoplasm of lower lobe, right bronchus or lung: Secondary | ICD-10-CM | POA: Diagnosis not present

## 2018-07-31 DIAGNOSIS — Z87891 Personal history of nicotine dependence: Secondary | ICD-10-CM | POA: Diagnosis not present

## 2018-07-31 DIAGNOSIS — Z7901 Long term (current) use of anticoagulants: Secondary | ICD-10-CM | POA: Diagnosis not present

## 2018-07-31 DIAGNOSIS — Z510 Encounter for antineoplastic radiation therapy: Secondary | ICD-10-CM | POA: Diagnosis not present

## 2018-07-31 DIAGNOSIS — Z79899 Other long term (current) drug therapy: Secondary | ICD-10-CM | POA: Diagnosis not present

## 2018-07-31 DIAGNOSIS — Z8541 Personal history of malignant neoplasm of cervix uteri: Secondary | ICD-10-CM | POA: Diagnosis not present

## 2018-07-31 DIAGNOSIS — Z5111 Encounter for antineoplastic chemotherapy: Secondary | ICD-10-CM | POA: Diagnosis not present

## 2018-07-31 DIAGNOSIS — Z7951 Long term (current) use of inhaled steroids: Secondary | ICD-10-CM | POA: Diagnosis not present

## 2018-07-31 DIAGNOSIS — M1990 Unspecified osteoarthritis, unspecified site: Secondary | ICD-10-CM | POA: Diagnosis not present

## 2018-07-31 DIAGNOSIS — Z85038 Personal history of other malignant neoplasm of large intestine: Secondary | ICD-10-CM | POA: Diagnosis not present

## 2018-07-31 LAB — HEPATITIS BE ANTIBODY, SERUM: HEPATITIS BE ANTIBODY, SERUM: NEGATIVE

## 2018-07-31 LAB — HEPATITIS B E-ANTIGEN AND HEPATITIS B E-ANTIBODY, SERUM
HEPATITIS BE ANTIBODY, SERUM: NEGATIVE
HEPATITIS BE ANTIGEN: NEGATIVE

## 2018-07-31 LAB — HEPATITIS BE ANTIGEN, SERUM: HEPATITIS BE ANTIGEN: NEGATIVE

## 2018-08-01 ENCOUNTER — Ambulatory Visit (HOSPITAL_COMMUNITY): Payer: Medicare Other

## 2018-08-04 ENCOUNTER — Inpatient Hospital Stay (HOSPITAL_COMMUNITY)
Admission: RE | Admit: 2018-08-04 | Discharge: 2018-08-04 | Disposition: A | Discharge status: Still In Hospital | Payer: Medicare Other | Source: Ambulatory Visit

## 2018-08-05 ENCOUNTER — Inpatient Hospital Stay (HOSPITAL_COMMUNITY)
Admission: RE | Admit: 2018-08-05 | Discharge: 2018-08-05 | Disposition: A | Discharge status: Still In Hospital | Payer: Medicare Other | Source: Ambulatory Visit | Admitting: Radiation Oncology

## 2018-08-05 ENCOUNTER — Inpatient Hospital Stay (HOSPITAL_COMMUNITY)
Admission: RE | Admit: 2018-08-05 | Discharge: 2018-08-05 | Disposition: A | Discharge status: Still In Hospital | Payer: Medicare Other | Source: Ambulatory Visit

## 2018-08-05 ENCOUNTER — Encounter (HOSPITAL_COMMUNITY): Payer: Self-pay | Admitting: Internal Medicine

## 2018-08-05 NOTE — Nurses Notes (Signed)
Wt 288 lbs. Pt in room 3 for OTV Lung. Pt denies any complaints or changes to medication/allergies Jimmye Norman, RN

## 2018-08-05 NOTE — Nursing Note (Signed)
US Liver scheduled on 08/19/18 for 9:45 am @ [facility]. Advised patient to arrive 15-30 minutes prior to testing to register. Instructed patient to be NPO (nothing to eat or drink) after midnight. Confirmed appointment date/time/location/instructions verbally. Mailed order & business card to patient. Marquis Buggy, MA  08/05/2018, 15:25

## 2018-08-05 NOTE — Progress Notes (Signed)
1440 rad  No c/o  Seems to be stable and tol OK  cpm

## 2018-08-06 ENCOUNTER — Ambulatory Visit (HOSPITAL_COMMUNITY)
Admission: RE | Admit: 2018-08-06 | Discharge: 2018-08-06 | Disposition: A | Discharge status: Home / Self Care | Payer: Medicare Other | Source: Ambulatory Visit

## 2018-08-06 ENCOUNTER — Encounter (HOSPITAL_COMMUNITY): Payer: Self-pay | Admitting: Internal Medicine

## 2018-08-06 ENCOUNTER — Inpatient Hospital Stay (HOSPITAL_COMMUNITY)
Admission: RE | Admit: 2018-08-06 | Discharge: 2018-08-06 | Disposition: A | Discharge status: Still In Hospital | Payer: Medicare Other | Source: Ambulatory Visit

## 2018-08-06 ENCOUNTER — Ambulatory Visit (HOSPITAL_BASED_OUTPATIENT_CLINIC_OR_DEPARTMENT_OTHER): Payer: Medicare Other | Admitting: Internal Medicine

## 2018-08-06 ENCOUNTER — Inpatient Hospital Stay (HOSPITAL_COMMUNITY): Payer: Medicare Other

## 2018-08-06 VITALS — BP 130/70 | HR 66 | Temp 96.2°F | Ht 62.0 in | Wt 288.0 lb

## 2018-08-06 DIAGNOSIS — C3491 Malignant neoplasm of unspecified part of right bronchus or lung: Secondary | ICD-10-CM | POA: Diagnosis not present

## 2018-08-06 DIAGNOSIS — R740 Nonspecific elevation of levels of transaminase and lactic acid dehydrogenase [LDH]: Secondary | ICD-10-CM | POA: Diagnosis not present

## 2018-08-06 DIAGNOSIS — T451X5A Adverse effect of antineoplastic and immunosuppressive drugs, initial encounter: Secondary | ICD-10-CM | POA: Diagnosis not present

## 2018-08-06 DIAGNOSIS — D701 Agranulocytosis secondary to cancer chemotherapy: Secondary | ICD-10-CM | POA: Diagnosis not present

## 2018-08-06 DIAGNOSIS — R112 Nausea with vomiting, unspecified: Secondary | ICD-10-CM | POA: Diagnosis not present

## 2018-08-06 DIAGNOSIS — C7801 Secondary malignant neoplasm of right lung: Secondary | ICD-10-CM | POA: Insufficient documentation

## 2018-08-06 DIAGNOSIS — Z7901 Long term (current) use of anticoagulants: Secondary | ICD-10-CM

## 2018-08-06 DIAGNOSIS — J069 Acute upper respiratory infection, unspecified: Secondary | ICD-10-CM | POA: Diagnosis present

## 2018-08-06 DIAGNOSIS — Z6841 Body Mass Index (BMI) 40.0 and over, adult: Secondary | ICD-10-CM

## 2018-08-06 DIAGNOSIS — E785 Hyperlipidemia, unspecified: Secondary | ICD-10-CM | POA: Insufficient documentation

## 2018-08-06 DIAGNOSIS — D703 Neutropenia due to infection: Principal | ICD-10-CM | POA: Diagnosis present

## 2018-08-06 DIAGNOSIS — Z87891 Personal history of nicotine dependence: Secondary | ICD-10-CM | POA: Insufficient documentation

## 2018-08-06 DIAGNOSIS — N390 Urinary tract infection, site not specified: Secondary | ICD-10-CM | POA: Diagnosis present

## 2018-08-06 DIAGNOSIS — Z9884 Bariatric surgery status: Secondary | ICD-10-CM | POA: Insufficient documentation

## 2018-08-06 DIAGNOSIS — I4891 Unspecified atrial fibrillation: Secondary | ICD-10-CM | POA: Diagnosis present

## 2018-08-06 DIAGNOSIS — C187 Malignant neoplasm of sigmoid colon: Secondary | ICD-10-CM | POA: Insufficient documentation

## 2018-08-06 DIAGNOSIS — C3490 Malignant neoplasm of unspecified part of unspecified bronchus or lung: Secondary | ICD-10-CM

## 2018-08-06 DIAGNOSIS — C3431 Malignant neoplasm of lower lobe, right bronchus or lung: Secondary | ICD-10-CM | POA: Diagnosis present

## 2018-08-06 DIAGNOSIS — B9620 Unspecified Escherichia coli [E. coli] as the cause of diseases classified elsewhere: Secondary | ICD-10-CM | POA: Diagnosis present

## 2018-08-06 DIAGNOSIS — Z85038 Personal history of other malignant neoplasm of large intestine: Secondary | ICD-10-CM

## 2018-08-06 DIAGNOSIS — J449 Chronic obstructive pulmonary disease, unspecified: Secondary | ICD-10-CM | POA: Insufficient documentation

## 2018-08-06 DIAGNOSIS — B029 Zoster without complications: Secondary | ICD-10-CM | POA: Diagnosis present

## 2018-08-06 DIAGNOSIS — I10 Essential (primary) hypertension: Secondary | ICD-10-CM | POA: Insufficient documentation

## 2018-08-06 DIAGNOSIS — C781 Secondary malignant neoplasm of mediastinum: Secondary | ICD-10-CM | POA: Insufficient documentation

## 2018-08-06 DIAGNOSIS — R7401 Elevation of levels of liver transaminase levels: Secondary | ICD-10-CM

## 2018-08-06 DIAGNOSIS — Z79899 Other long term (current) drug therapy: Secondary | ICD-10-CM | POA: Insufficient documentation

## 2018-08-06 DIAGNOSIS — K760 Fatty (change of) liver, not elsewhere classified: Secondary | ICD-10-CM | POA: Diagnosis present

## 2018-08-06 DIAGNOSIS — R197 Diarrhea, unspecified: Secondary | ICD-10-CM | POA: Diagnosis present

## 2018-08-06 DIAGNOSIS — Z7951 Long term (current) use of inhaled steroids: Secondary | ICD-10-CM

## 2018-08-06 DIAGNOSIS — R5081 Fever presenting with conditions classified elsewhere: Secondary | ICD-10-CM | POA: Diagnosis present

## 2018-08-06 DIAGNOSIS — B372 Candidiasis of skin and nail: Secondary | ICD-10-CM | POA: Diagnosis present

## 2018-08-06 LAB — CBC WITH DIFF
BASOPHIL #: 0 x10ˆ3/uL (ref 0.00–0.20)
BASOPHIL %: 2 %
EOSINOPHIL #: 0 x10ˆ3/uL (ref 0.00–0.50)
EOSINOPHIL %: 1 %
HCT: 40.5 % (ref 34.6–46.2)
HGB: 12.9 g/dL (ref 11.8–15.8)
LYMPHOCYTE #: 0.7 x10ˆ3/uL — ABNORMAL LOW (ref 0.90–3.40)
LYMPHOCYTE %: 36 %
MCH: 25.7 pg — ABNORMAL LOW (ref 27.6–33.2)
MCHC: 31.9 g/dL — ABNORMAL LOW (ref 32.6–35.4)
MCV: 80.6 fL — ABNORMAL LOW (ref 82.3–96.7)
MONOCYTE #: 0.2 x10ˆ3/uL (ref 0.20–0.90)
MONOCYTE %: 9 %
MPV: 8.4 fL (ref 6.6–10.2)
NEUTROPHIL #: 1 x10ˆ3/uL — ABNORMAL LOW (ref 1.50–6.40)
NEUTROPHIL %: 53 %
PLATELETS: 229 x10ˆ3/uL (ref 140–440)
RBC: 5.03 10*6/uL (ref 3.80–5.24)
RDW: 16.2 % — ABNORMAL HIGH (ref 12.4–15.2)
WBC: 1.8 x10ˆ3/uL — ABNORMAL LOW (ref 3.5–10.3)

## 2018-08-06 LAB — COMPREHENSIVE METABOLIC PANEL, NON-FASTING
ALBUMIN: 3.6 g/dL (ref 3.2–4.6)
ALKALINE PHOSPHATASE: 83 U/L (ref 20–130)
ALT (SGPT): 119 U/L — ABNORMAL HIGH (ref <=52)
ANION GAP: 5 mmol/L
AST (SGOT): 76 U/L — ABNORMAL HIGH (ref <=35)
BILIRUBIN TOTAL: 0.7 mg/dL (ref 0.3–1.2)
BUN/CREA RATIO: 19
BUN: 16 mg/dL (ref 10–25)
CALCIUM: 8.5 mg/dL — ABNORMAL LOW (ref 8.8–10.3)
CHLORIDE: 106 mmol/L (ref 98–111)
CO2 TOTAL: 24 mmol/L (ref 21–35)
CREATININE: 0.86 mg/dL (ref <=1.30)
ESTIMATED GFR: >60 mL/min/1.73mˆ2
GLUCOSE: 132 mg/dL — ABNORMAL HIGH (ref 70–110)
POTASSIUM: 4.2 mmol/L (ref 3.5–5.0)
PROTEIN TOTAL: 5.9 g/dL — ABNORMAL LOW (ref 6.0–8.3)
SODIUM: 135 mmol/L (ref 135–145)

## 2018-08-06 NOTE — Progress Notes (Signed)
[place]  [place] [address]        Encounter Date: 08/06/2018   9:15 AM EST    Name:  Kelsey [name]  Age: 67 y.o.  DOB: August 01, 1951  Sex: female  PCP: Benjie Karvonen    Chief Complaint:  No chief complaint on file.        HISTORY OF PRESENT ILLNESS:   67 y.o. female with history of stage III colon cancer of present evaluation and management of lung cancer.    1. September 2015. Patient underwent sigmoidoscopy and was found to have polyp which was adenocarcinoma.    2. June 10, 2014. She was admitted for segmental resection of sigmoid colon. This revealed pT3, N1a, MX, low-grade  adenocarcinoma of the colon. The mass measuring 4 x 3 x 0.5 centimeters. It  was low-grade and there was no evidence of microsatellite instability by  histology. Margins were uninvolved and 4 lymph nodes only were removed which  only 1 was involved. This corresponding stage IIIB, T3, N1a, M0, low-grade  sigmoid cancer.   3. July 07, 2014. Recommended adjuvant chemotherapy with FOLFOX.    4. July 27, 2014. Patient was started on adjuvant chemotherapy with FOLFOX.  Patient received only 5 treatments and chemotherapy was stopped due to poor tolerance.  After that patient lost follow-up.      5. October 2019. Patient was admitted to hospital with concern for  bowel obstruction.  CT chest showed lung nodules.  Patient was managed conservatively and discharged home.    6. May 06, 2018.  Patient underwent CT-guided biopsy of lung lesion.  Pathology showed well-differentiated adenocarcinoma.  Section shows well-differentiated adenocarcinoma which positive for CK 7 and TTF 1, negative for P 63 and CK 20.     7. June 23, 2018. PET scan showed Hypermetabolic right lower lobe pulmonary mass compatible with malignancy.  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 Hepatomegaly and steatosis.     8. July 23, 2018. Patient was started on chemoradiation.    SUBJECTIVE:  Patient today presents for follow-up in treatment.  Denies any new issues since last visit.  Tolerated treatment well.  Denies any fevers or chills.    REVIEW OF SYSTEMS:  GENERAL:  no fevers, chills  HEENT: no sore throat, congestion, blurry vision  Lungs: no shortness of breath, cough, wheezes at this time  GI: no nausea, vomiting, diarrhea, constipation  GU: No dysuria, urgency, increased frequency   Cardiac: no chest pains, palpitations, syncope,   Neuro: no weakness, loss of function, numbness, tingling  Skin: no rash  Musculoskeletal: No myalgias  Psychosocial: No depression, anxiety  Hematologic: No easy bruising, abnormal bleeding  All other review ROS negative except those mentioned above.     PATIENT HISTORY:  Past Medical History:   Diagnosis Date   . A-fib (CMS HCC)    . Arthropathy, unspecified, site unspecified    . Asthma    . Cancer (CMS HCC)     cervical   . Cataract    . Colon cancer (CMS [place]) 07/06/2014   . COPD (chronic obstructive pulmonary disease) (CMS HCC)    . Fistula    . HTN (hypertension)    .  Hyperlipidemia    . Obesity    . Sleep apnea    . Ventral hernia          Past Surgical History:   Procedure Laterality Date   . ABCESS DRAINAGE     . BOWEL RESECTION     . CATARACT EXTRACTION     . HX CERVICAL CONE BIOPSY      . HX CESAREAN SECTION     . HX GASTRIC BYPASS      25 years ago   . [place] (x2)   . HX HYSTERECTOMY      late 1990s   . HX LAP BANDING      2013   . HX LAPAROTOMY      2013 to remove lap band         Family Medical History:     Problem Relation (Age of Onset)    Diabetes Mother, Maternal Grandmother, Maternal Grandfather, Father    Heart Attack Mother    Stroke Father            Current Outpatient Medications   Medication Sig   . apixaban (ELIQUIS) 5 mg Oral Tablet Take 1 Tab (5 mg total) by mouth Twice daily   . atorvastatin (LIPITOR) 10 mg Oral Tablet Take 10 mg by mouth Once a day   .  budesonide-formoterol (SYMBICORT) 160-4.5 mcg/actuation Inhalation HFA Aerosol Inhaler Take 2 Puffs by inhalation Twice daily   . CARVEDILOL ORAL Take 6.25 mg by mouth Every evening    . dexAMETHasone (DECADRON) 2 mg Oral Tablet Take 1 Tab (2 mg total) by mouth Once a day   . dilTIAZem (CARDIZEM CD) 120 mg Oral Capsule, Sust. Release 24 hr Take 120 mg by mouth Once a day   . docusate sodium (COLACE) 100 mg Oral Capsule Take 100 mg by mouth Three times a day   . DULoxetine (CYMBALTA DR) 30 mg Oral Capsule, Delayed Release(E.C.) Take 30 mg by mouth Once a day   . flecainide (TAMBOCOR) 50 mg Oral Tablet Take 1 Tab (50 mg total) by mouth Twice daily   . linaCLOtide (LINZESS) 72 mcg Oral Capsule Take 72 mcg by mouth Every morning   . lisinopril (PRINIVIL) 5 mg Oral Tablet Take 1 Tab (5 mg total) by mouth Once a day (Patient not taking: Reported on 08/06/2018)   . loperamide (IMODIUM) 2 mg Oral Capsule Take 2 mg by mouth Every 4 hours as needed   . omeprazole (PRILOSEC) 20 mg Oral Capsule, Delayed Release(E.C.) Take 40 mg by mouth Once a day    . potassium chloride (K-DUR) 20 mEq Oral Tab Sust.Rel. Particle/Crystal Take 20 mEq by mouth Once a day   . prochlorperazine (COMPAZINE) 10 mg Oral Tablet Take 1 Tab (10 mg total) by mouth Four times a day as needed for nausea/vomiting   . prochlorperazine (COMPAZINE) 5 mg Oral Tablet Take 1 Tab (5 mg total) by mouth Four times a day as needed for nausea/vomiting   . traZODone (DESYREL) 100 mg Oral Tablet Take 100 mg by mouth Every night     Social History     Socioeconomic History   . Marital status: Married     Spouse name: Not on file   . Number of children: Not on file   . Years of education: Not on file   . Highest education level: Not on file   Tobacco Use   . Smoking status: Former Research scientist (life sciences)   .  Smokeless tobacco: Never Used   . Tobacco comment: quit 10 yrs ago   Substance and Sexual Activity   . Alcohol use: Not Currently     Comment: rarely   . Drug use: No   Other Topics  Concern   . Ability to Walk 1 Flight of Steps without SOB/CP No   . Ability To Do Own ADL's Yes       PHYSICAL EXAMINATION:  VITALS: BP 130/70   Pulse 66   Temp 35.7 C (96.2 F)   Ht 1.575 m ('5\' 2"'$ )   Wt 130.6 kg (288 lb)   SpO2 96%   BMI 52.68 kg/m         PHYSICAL EXAM:   Consitutional: No acute distress.    Eyes: EOMI. No discharge. No Jaundice.   ENT: mucous membranes dry. No posterior pharynx lesions.. Neck supple, No palpable masses   Heme/ Lymph: No Cervical, Inguinal, axillary lymh nodes. No Bruising.   Cardiovascular: S1, S2, No murmurs, rubs, or gallops.   Respiratory: Clear to auscultate and percuss B/L, No rhonchi, crackles, or wheezing.   Abdomen: Normal Bowel Sounds, nontender nondistended. No hepatosplenomegaly.   Musculoskeletal: No Edema to the extremities.   Skin: Normal turgor. No Rashes,skin lesions   Psychiatry: Normal Affect   Neuro: No focal deficits. Alert and Oriented x 3      ENCOUNTER ORDERS:  No orders of the defined types were placed in this encounter.      LABORATORY/RADIOLOGICAL DATA: All pertinent labs/radiology were reviewed.     ASSESSMENT AND PLAN:  Encounter Diagnoses   Name Primary?   . Malignant neoplasm of lung, unspecified laterality, unspecified part of lung (CMS HCC) Yes   . Transaminitis    . Malignant neoplasm of unspecified part of right bronchus or lung (CMS [place])     67 y.o. female with history of stage III colon cancer status post 5 cycle of FOLFOX now presenting with stage III lung cancer.  I explained the reason for referred to Hematology/Oncology Clinic.      Adenocarcinoma lung:  Stage III.   - CT brain to rule out metastatic disease.  NEGATIVE  - Port in place.  - Started on chemoradiation carboplatin/Taxol along with radiation- July 23, 2018.  After completion of chemo radiation patient will get PET scan.  If continued to have good response will start maintenance immunotherapy for 1 year.          CHEMOTHERAPY:/RADIATION  CYCLE DAY   DRUG DOSE TOTAL  DOSE % ROUTE SCHEDULE   Carboplatin AUC- 2 205 mg    Weekly    Paclitaxel 45 mg/m2 108 mg    Weekly                      Shared decision.  I discussed diagnosis, prognosis and treatment option with patient and family.  I had detailed discussion with patient regarding chemotherapy and radiation.  I also discussed brain scan to rule out metastatic disease before starting treatment.  I counseled patient regarding chemotherapy side effects and their management.  Patient understand and want to proceed with treatment.      Neutropenia:  Due to chemotherapy.  - will hold treatment today.  - recommended to call or go to local ER in case of any fever.    CINV  - Zofran PRN    Transaminitis:  -hepatitis serology negative.  - patient is scheduled for ultrasound liver.    Vanetta Mulders, MD  Hematology/Oncology

## 2018-08-07 ENCOUNTER — Encounter (HOSPITAL_COMMUNITY): Payer: Self-pay

## 2018-08-07 ENCOUNTER — Inpatient Hospital Stay (HOSPITAL_COMMUNITY): Payer: Medicare Other | Admitting: Hospitalist

## 2018-08-07 ENCOUNTER — Ambulatory Visit (HOSPITAL_COMMUNITY)
Admission: RE | Admit: 2018-08-07 | Discharge: 2018-08-07 | Disposition: A | Discharge status: Home / Self Care | Payer: Medicare Other | Source: Ambulatory Visit

## 2018-08-07 ENCOUNTER — Inpatient Hospital Stay (HOSPITAL_COMMUNITY)
Admission: RE | Admit: 2018-08-07 | Discharge: 2018-08-07 | Disposition: A | Discharge status: Still In Hospital | Payer: Medicare Other | Source: Ambulatory Visit

## 2018-08-07 ENCOUNTER — Inpatient Hospital Stay
Admission: EM | Admit: 2018-08-07 | Discharge: 2018-08-09 | DRG: 809 | Disposition: A | Discharge status: Home / Self Care | Payer: Medicare Other | Attending: Internal Medicine | Admitting: Internal Medicine

## 2018-08-07 ENCOUNTER — Telehealth (HOSPITAL_COMMUNITY): Payer: Self-pay

## 2018-08-07 ENCOUNTER — Other Ambulatory Visit: Payer: Self-pay

## 2018-08-07 ENCOUNTER — Emergency Department (HOSPITAL_COMMUNITY): Payer: Medicare Other

## 2018-08-07 DIAGNOSIS — J449 Chronic obstructive pulmonary disease, unspecified: Secondary | ICD-10-CM | POA: Diagnosis present

## 2018-08-07 DIAGNOSIS — I4891 Unspecified atrial fibrillation: Secondary | ICD-10-CM | POA: Diagnosis present

## 2018-08-07 DIAGNOSIS — N390 Urinary tract infection, site not specified: Secondary | ICD-10-CM | POA: Diagnosis present

## 2018-08-07 DIAGNOSIS — C3431 Malignant neoplasm of lower lobe, right bronchus or lung: Secondary | ICD-10-CM | POA: Diagnosis present

## 2018-08-07 DIAGNOSIS — I10 Essential (primary) hypertension: Secondary | ICD-10-CM | POA: Diagnosis present

## 2018-08-07 DIAGNOSIS — Z452 Encounter for adjustment and management of vascular access device: Secondary | ICD-10-CM | POA: Diagnosis not present

## 2018-08-07 DIAGNOSIS — Z85038 Personal history of other malignant neoplasm of large intestine: Secondary | ICD-10-CM | POA: Diagnosis not present

## 2018-08-07 DIAGNOSIS — R509 Fever, unspecified: Secondary | ICD-10-CM | POA: Diagnosis not present

## 2018-08-07 DIAGNOSIS — D709 Neutropenia, unspecified: Secondary | ICD-10-CM | POA: Diagnosis not present

## 2018-08-07 DIAGNOSIS — R5081 Fever presenting with conditions classified elsewhere: Secondary | ICD-10-CM | POA: Diagnosis present

## 2018-08-07 DIAGNOSIS — Z7951 Long term (current) use of inhaled steroids: Secondary | ICD-10-CM | POA: Diagnosis not present

## 2018-08-07 DIAGNOSIS — D703 Neutropenia due to infection: Secondary | ICD-10-CM | POA: Diagnosis present

## 2018-08-07 DIAGNOSIS — J069 Acute upper respiratory infection, unspecified: Secondary | ICD-10-CM | POA: Diagnosis present

## 2018-08-07 DIAGNOSIS — B029 Zoster without complications: Secondary | ICD-10-CM | POA: Diagnosis present

## 2018-08-07 DIAGNOSIS — B9620 Unspecified Escherichia coli [E. coli] as the cause of diseases classified elsewhere: Secondary | ICD-10-CM | POA: Diagnosis present

## 2018-08-07 DIAGNOSIS — B372 Candidiasis of skin and nail: Secondary | ICD-10-CM | POA: Diagnosis present

## 2018-08-07 DIAGNOSIS — C3490 Malignant neoplasm of unspecified part of unspecified bronchus or lung: Secondary | ICD-10-CM | POA: Diagnosis not present

## 2018-08-07 DIAGNOSIS — R079 Chest pain, unspecified: Secondary | ICD-10-CM | POA: Diagnosis not present

## 2018-08-07 DIAGNOSIS — D899 Disorder involving the immune mechanism, unspecified: Secondary | ICD-10-CM | POA: Diagnosis not present

## 2018-08-07 DIAGNOSIS — R740 Nonspecific elevation of levels of transaminase and lactic acid dehydrogenase [LDH]: Secondary | ICD-10-CM | POA: Diagnosis not present

## 2018-08-07 DIAGNOSIS — Z87891 Personal history of nicotine dependence: Secondary | ICD-10-CM | POA: Diagnosis not present

## 2018-08-07 DIAGNOSIS — K760 Fatty (change of) liver, not elsewhere classified: Secondary | ICD-10-CM | POA: Diagnosis present

## 2018-08-07 DIAGNOSIS — Z7901 Long term (current) use of anticoagulants: Secondary | ICD-10-CM | POA: Diagnosis not present

## 2018-08-07 DIAGNOSIS — E785 Hyperlipidemia, unspecified: Secondary | ICD-10-CM | POA: Diagnosis present

## 2018-08-07 DIAGNOSIS — Z9884 Bariatric surgery status: Secondary | ICD-10-CM | POA: Diagnosis not present

## 2018-08-07 DIAGNOSIS — Z85118 Personal history of other malignant neoplasm of bronchus and lung: Secondary | ICD-10-CM | POA: Diagnosis not present

## 2018-08-07 DIAGNOSIS — Z9221 Personal history of antineoplastic chemotherapy: Secondary | ICD-10-CM | POA: Diagnosis not present

## 2018-08-07 DIAGNOSIS — Z6841 Body Mass Index (BMI) 40.0 and over, adult: Secondary | ICD-10-CM | POA: Diagnosis not present

## 2018-08-07 DIAGNOSIS — R197 Diarrhea, unspecified: Secondary | ICD-10-CM | POA: Diagnosis present

## 2018-08-07 DIAGNOSIS — D849 Immunodeficiency, unspecified: Secondary | ICD-10-CM

## 2018-08-07 DIAGNOSIS — J45909 Unspecified asthma, uncomplicated: Secondary | ICD-10-CM

## 2018-08-07 LAB — URINALYSIS, MACRO/MICRO
BLOOD: NEGATIVE mg/dL
GLUCOSE: NEGATIVE mg/dL
KETONES: NEGATIVE mg/dL
LEUKOCYTES: NEGATIVE WBCs/uL
NITRITE: POSITIVE — AB
PH: 5 (ref 5.0–7.0)
PROTEIN: NEGATIVE mg/dL
SPECIFIC GRAVITY: 1.027 — ABNORMAL HIGH (ref 1.010–1.025)
UROBILINOGEN: 1 mg/dL

## 2018-08-07 LAB — COMPREHENSIVE METABOLIC PANEL, NON-FASTING
ALBUMIN: 3.6 g/dL (ref 3.2–4.6)
ALKALINE PHOSPHATASE: 80 U/L (ref 20–130)
ALT (SGPT): 115 U/L — ABNORMAL HIGH (ref <=52)
ANION GAP: 5 mmol/L
AST (SGOT): 79 U/L — ABNORMAL HIGH (ref <=35)
BILIRUBIN TOTAL: 0.5 mg/dL (ref 0.3–1.2)
BUN/CREA RATIO: 19
BUN: 16 mg/dL (ref 10–25)
CALCIUM: 8.4 mg/dL — ABNORMAL LOW (ref 8.8–10.3)
CHLORIDE: 109 mmol/L (ref 98–111)
CO2 TOTAL: 24 mmol/L (ref 21–35)
CREATININE: 0.85 mg/dL (ref <=1.30)
ESTIMATED GFR: >60 mL/min/1.73mˆ2
GLUCOSE: 130 mg/dL — ABNORMAL HIGH (ref 70–110)
POTASSIUM: 3.6 mmol/L (ref 3.5–5.0)
PROTEIN TOTAL: 5.9 g/dL — ABNORMAL LOW (ref 6.0–8.3)
SODIUM: 138 mmol/L (ref 135–145)

## 2018-08-07 LAB — CBC WITH DIFF
BASOPHIL #: 0 x10ˆ3/uL (ref 0.00–0.20)
BASOPHIL %: 2 %
EOSINOPHIL #: 0 x10ˆ3/uL (ref 0.00–0.50)
EOSINOPHIL %: 1 %
HCT: 39.1 % (ref 34.6–46.2)
HGB: 12.5 g/dL (ref 11.8–15.8)
LYMPHOCYTE #: 0.7 x10ˆ3/uL — ABNORMAL LOW (ref 0.90–3.40)
LYMPHOCYTE %: 38 %
MCH: 25.4 pg — ABNORMAL LOW (ref 27.6–33.2)
MCHC: 32 g/dL — ABNORMAL LOW (ref 32.6–35.4)
MCV: 79.3 fL — ABNORMAL LOW (ref 82.3–96.7)
MONOCYTE #: 0.2 x10ˆ3/uL (ref 0.20–0.90)
MONOCYTE %: 10 %
MPV: 8.1 fL (ref 6.6–10.2)
MPV: 8.1 fL (ref 6.6–10.2)
NEUTROPHIL #: 0.9 x10ˆ3/uL — ABNORMAL LOW (ref 1.50–6.40)
NEUTROPHIL %: 50 %
PLATELETS: 220 x10ˆ3/uL (ref 140–440)
RBC: 4.93 x10ˆ6/uL (ref 3.80–5.24)
RDW: 16.2 % — ABNORMAL HIGH (ref 12.4–15.2)
WBC: 1.7 x10ˆ3/uL — ABNORMAL LOW (ref 3.5–10.3)

## 2018-08-07 MED ORDER — CEFEPIME 2G IN NS 50ML MINIBAG IVPB
2.00 g | INTRAMUSCULAR | Status: AC
Start: 2018-08-07 — End: 2018-08-07
  Administered 2018-08-07: 0 g via INTRAVENOUS
  Administered 2018-08-07: 2 g via INTRAVENOUS
  Filled 2018-08-07: qty 50

## 2018-08-07 MED ORDER — DILTIAZEM CD 120 MG CAPSULE,EXTENDED RELEASE 24 HR
120.0000 mg | ORAL_CAPSULE | Freq: Every day | ORAL | Status: DC
Start: 2018-08-08 — End: 2018-08-09
  Administered 2018-08-08 – 2018-08-09 (×2): 120 mg via ORAL
  Filled 2018-08-07 (×3): qty 1

## 2018-08-07 MED ORDER — SODIUM CHLORIDE 0.9 % INTRAVENOUS SOLUTION
INTRAVENOUS | Status: DC
Start: 2018-08-07 — End: 2018-08-08

## 2018-08-07 MED ORDER — CEFEPIME 1G IN NS 50ML MINIBAG IVPB
1.00 g | Freq: Two times a day (BID) | INTRAMUSCULAR | Status: DC
Start: 2018-08-08 — End: 2018-08-08
  Administered 2018-08-08: 1 g via INTRAVENOUS
  Administered 2018-08-08: 0 g via INTRAVENOUS
  Filled 2018-08-07 (×3): qty 50

## 2018-08-07 MED ORDER — DOXYCYCLINE HYCLATE 100 MG CAPSULE
100.00 mg | ORAL_CAPSULE | Freq: Two times a day (BID) | ORAL | Status: DC
Start: 2018-08-07 — End: 2018-08-09
  Administered 2018-08-07 – 2018-08-09 (×4): 100 mg via ORAL
  Filled 2018-08-07 (×7): qty 1

## 2018-08-07 MED ORDER — LISINOPRIL 5 MG TABLET
5.0000 mg | ORAL_TABLET | Freq: Every day | ORAL | Status: DC
Start: 2018-08-08 — End: 2018-08-09
  Administered 2018-08-08 – 2018-08-09 (×3): 5 mg via ORAL
  Filled 2018-08-07 (×3): qty 1

## 2018-08-07 MED ORDER — PANTOPRAZOLE 40 MG TABLET,DELAYED RELEASE
40.00 mg | DELAYED_RELEASE_TABLET | Freq: Every morning | ORAL | Status: DC
Start: 2018-08-08 — End: 2018-08-09
  Administered 2018-08-08 – 2018-08-09 (×2): 40 mg via ORAL
  Filled 2018-08-07 (×4): qty 1

## 2018-08-07 MED ORDER — BUDESONIDE 0.5 MG/2 ML SUSPENSION FOR NEBULIZATION
0.50 mg | INHALATION_SUSPENSION | Freq: Two times a day (BID) | RESPIRATORY_TRACT | Status: DC
Start: 2018-08-07 — End: 2018-08-09
  Administered 2018-08-07 – 2018-08-09 (×4): 0.5 mg via RESPIRATORY_TRACT
  Filled 2018-08-07 (×7): qty 2

## 2018-08-07 MED ORDER — SODIUM CHLORIDE 0.9 % (FLUSH) INJECTION SYRINGE
3.0000 mL | INJECTION | Freq: Three times a day (TID) | INTRAMUSCULAR | Status: DC
Start: 2018-08-07 — End: 2018-08-09
  Administered 2018-08-07 – 2018-08-09 (×6): 0 mL

## 2018-08-07 MED ORDER — FLECAINIDE 50 MG TABLET
50.00 mg | ORAL_TABLET | Freq: Two times a day (BID) | ORAL | Status: DC
Start: 2018-08-07 — End: 2018-08-09
  Administered 2018-08-07 – 2018-08-09 (×4): 50 mg via ORAL
  Filled 2018-08-07 (×7): qty 1

## 2018-08-07 MED ORDER — SODIUM CHLORIDE 0.9 % INTRAVENOUS SOLUTION
INTRAVENOUS | Status: DC
Start: 2018-08-07 — End: 2018-08-07
  Administered 2018-08-07: 0 via INTRAVENOUS

## 2018-08-07 MED ORDER — CARVEDILOL 6.25 MG TABLET
6.25 mg | ORAL_TABLET | Freq: Every evening | ORAL | Status: DC
Start: 2018-08-07 — End: 2018-08-09
  Administered 2018-08-07 – 2018-08-08 (×2): 6.25 mg via ORAL
  Filled 2018-08-07 (×4): qty 1

## 2018-08-07 MED ORDER — APIXABAN 5 MG TABLET
5.00 mg | ORAL_TABLET | Freq: Two times a day (BID) | ORAL | Status: DC
Start: 2018-08-07 — End: 2018-08-09
  Administered 2018-08-07 – 2018-08-09 (×4): 5 mg via ORAL
  Filled 2018-08-07 (×7): qty 1

## 2018-08-07 MED ORDER — BUDESONIDE-FORMOTEROL HFA 160 MCG-4.5 MCG/ACTUATION - EAST
2.00 | INHALATION_SPRAY | Freq: Two times a day (BID) | RESPIRATORY_TRACT | Status: DC
Start: 2018-08-07 — End: 2018-08-07

## 2018-08-07 MED ORDER — SODIUM CHLORIDE 0.9 % (FLUSH) INJECTION SYRINGE
3.0000 mL | INJECTION | INTRAMUSCULAR | Status: DC | PRN
Start: 2018-08-07 — End: 2018-08-09

## 2018-08-07 MED ORDER — TRAZODONE 50 MG TABLET
50.00 mg | ORAL_TABLET | Freq: Every evening | ORAL | Status: DC | PRN
Start: 2018-08-07 — End: 2018-08-09
  Administered 2018-08-07 – 2018-08-08 (×2): 50 mg via ORAL
  Filled 2018-08-07 (×2): qty 1

## 2018-08-07 MED ORDER — NYSTATIN 100,000 UNIT/GRAM TOPICAL POWDER
Freq: Two times a day (BID) | CUTANEOUS | Status: DC
Start: 2018-08-07 — End: 2018-08-09
  Filled 2018-08-07: qty 15

## 2018-08-07 MED ORDER — ATORVASTATIN 10 MG TABLET
10.0000 mg | ORAL_TABLET | Freq: Every day | ORAL | Status: DC
Start: 2018-08-07 — End: 2018-08-09
  Administered 2018-08-07 – 2018-08-09 (×3): 10 mg via ORAL
  Filled 2018-08-07 (×3): qty 1

## 2018-08-07 MED ORDER — ALBUTEROL SULFATE 2.5 MG/3 ML (0.083 %) SOLUTION FOR NEBULIZATION
2.50 mg | INHALATION_SOLUTION | Freq: Four times a day (QID) | RESPIRATORY_TRACT | Status: DC
Start: 2018-08-07 — End: 2018-08-09
  Administered 2018-08-07 – 2018-08-09 (×6): 2.5 mg via RESPIRATORY_TRACT
  Administered 2018-08-09: 0 mg via RESPIRATORY_TRACT
  Filled 2018-08-07 (×14): qty 3

## 2018-08-07 MED ORDER — DULOXETINE 30 MG CAPSULE,DELAYED RELEASE
30.0000 mg | DELAYED_RELEASE_CAPSULE | Freq: Every day | ORAL | Status: DC
Start: 2018-08-08 — End: 2018-08-09
  Administered 2018-08-08 – 2018-08-09 (×2): 30 mg via ORAL
  Filled 2018-08-07 (×3): qty 1

## 2018-08-07 MED ADMIN — sodium chloride 0.9 % intravenous solution: INTRAVENOUS | @ 20:00:00

## 2018-08-07 MED ADMIN — sodium chloride 0.9 % (flush) injection syringe: ORAL | @ 21:00:00

## 2018-08-07 NOTE — H&P (Signed)
Kelsey [name]  Admission H&P    Date of Service:  08/07/2018  Kelsey [name], Kelsey [name], 67 y.o. female  Date of Admission:  08/07/2018  Date of Birth:  1950/12/18  PCP: Benjie Karvonen          Information Obtained from: patient  Chief Complaint:  Fever and chills     HPI: Kelsey [name] is a 67 y.o., White female  With stage III colon cancer, Lung CA (Last chemotherapy on 12/11) on chemoradiation ( started December 4th)  who presents with  Fever at home and chills. As per patient this morning she felt febrile checked her temperature and was found to be 100.53F in her left ear. It resolved by the time she arrived to the ED. She endorses runny nose, congestions, cough productive of clear sputum. Also endorses wheezing for the last 3 days. She denies any sick contacts; lives alone with husband. She also endorses dysuric symptoms and discolored urine. Also endorses rash in between her gluteus and a lesion on her right buttock which she noticed yesterday. Also admits to having chronic diarrhea for which she normally takes immodium.      In the ED, VS: 98.33F, 105/73 mmhg, 76 bpm, 16 b/m, 98% RA. Labs significant for WBC 1.7, glucose 130. AST 79, ALT 115, T bili 0.5    PAST MEDICAL:    Past Medical History:   Diagnosis Date   . A-fib (CMS HCC)    . Arthropathy, unspecified, site unspecified    . Asthma    . Cancer (CMS HCC)     cervical   . Cataract    . Colon cancer (CMS [place]) 07/06/2014   . COPD (chronic obstructive pulmonary disease) (CMS HCC)    . Fistula    . HTN (hypertension)    . Hyperlipidemia    . Obesity    . Sleep apnea    . Ventral hernia         Past Surgical History:   Procedure Laterality Date   . ABCESS DRAINAGE     . BOWEL RESECTION     . CATARACT EXTRACTION     . HX CERVICAL CONE BIOPSY      . HX CESAREAN SECTION     . HX GASTRIC BYPASS      25 years ago   . [place] (x2)   . HX HYSTERECTOMY      late 1990s   . HX LAP BANDING      2013   . HX  LAPAROTOMY      2013 to remove lap band            Medications Prior to Admission     Prescriptions    apixaban (ELIQUIS) 5 mg Oral Tablet    Take 1 Tab (5 mg total) by mouth Twice daily    atorvastatin (LIPITOR) 10 mg Oral Tablet    Take 10 mg by mouth Once a day    budesonide-formoterol (SYMBICORT) 160-4.5 mcg/actuation Inhalation HFA Aerosol Inhaler    Take 2 Puffs by inhalation Twice daily    CARVEDILOL ORAL    Take 6.25 mg by mouth Every evening     dexAMETHasone (DECADRON) 2 mg Oral Tablet    Take 1 Tab (2 mg total) by mouth Once a day    dilTIAZem (CARDIZEM CD) 120 mg Oral Capsule, Sust. Release 24 hr    Take 120 mg by mouth Once  a day    docusate sodium (COLACE) 100 mg Oral Capsule    Take 100 mg by mouth Three times a day    DULoxetine (CYMBALTA DR) 30 mg Oral Capsule, Delayed Release(E.C.)    Take 30 mg by mouth Once a day    flecainide (TAMBOCOR) 50 mg Oral Tablet    Take 1 Tab (50 mg total) by mouth Twice daily    linaCLOtide (LINZESS) 72 mcg Oral Capsule    Take 72 mcg by mouth Every morning    lisinopril (PRINIVIL) 5 mg Oral Tablet    Take 1 Tab (5 mg total) by mouth Once a day    loperamide (IMODIUM) 2 mg Oral Capsule    Take 2 mg by mouth Every 4 hours as needed    omeprazole (PRILOSEC) 20 mg Oral Capsule, Delayed Release(E.C.)    Take 40 mg by mouth Once a day     potassium chloride (K-DUR) 20 mEq Oral Tab Sust.Rel. Particle/Crystal    Take 20 mEq by mouth Once a day    prochlorperazine (COMPAZINE) 10 mg Oral Tablet    Take 1 Tab (10 mg total) by mouth Four times a day as needed for nausea/vomiting    prochlorperazine (COMPAZINE) 5 mg Oral Tablet    Take 1 Tab (5 mg total) by mouth Four times a day as needed for nausea/vomiting    traZODone (DESYREL) 100 mg Oral Tablet    Take 100 mg by mouth Every night        Allergies   Allergen Reactions   . Shellfish Containing Products Shortness of Breath, Rash and Itching     scallops   . Vancomycin Shortness of Breath   . Barium Iodide    . Iodine And Iodide  Containing Products      Itching, shortness of breath          Family History  Family Medical History:     Problem Relation (Age of Onset)    Diabetes Mother, Maternal Grandmother, Maternal Grandfather, Father    Heart Attack Mother    Stroke Father         Social History  Social History     Tobacco Use   . Smoking status: Former Research scientist (life sciences)   . Smokeless tobacco: Never Used   . Tobacco comment: quit 10 yrs ago   Substance Use Topics   . Alcohol use: Not Currently     Comment: rarely       ROS: Other than ROS in the HPI, all other systems were negative.    Examination:  Temperature: 37 C (98.6 F) Heart Rate: 87 BP (Non-Invasive): (!) 138/57   Respiratory Rate: 15 SpO2: 96 % Pain Score (Numeric, Faces): 0     Constitutional:  No distress and alert and oriented.  Eyes:  Conjunctiva clear, Pupils equal and round.   Neck:  Supple, symmetrical, trachea midline.  Respiratory:  Clear to auscultation bilaterally, no wheezes.  Cardiovascular:  Regular rate and rhythm, no murmurs.  Gastrointestinal:  Soft, non-tender, Bowel sounds normal, non-distended.  Musculoskeletal:  Head atraumatic and normocephalic.  Integumentary:  Skin warm and dry, No rashes or lesions.  Neurologic:  CN II - XII grossly intact, No tremor.     Labs:    CBC  (Last 24 hours)    Date/Time WBC HGB HCT MCV PLATELETS    08/07/18 1003 1.7 (L)    12.5    39.1    79.3 (L)    220  BMP  (Last 24 hours)    Date/Time Na K Cl CO2 BUN CREAT Calcium Glucose    08/07/18 1003 138    3.6    109    24    16    0.85    8.4 (L)    130 (H)         Hepatic Function  (Last 24 hours)    Date/Time Albumin Total PTN Total Bili Direct Bili AST/SGOT ALT/SGPT Alk Phos    08/07/18 1003 3.6    5.9 (L)    0.5    -- 79 (H)    115 (H)    80           Imaging Studies:   XR CHEST PA AND LATERAL   Final Result   1. There has been a slight decrease in size of the known RIGHT lower lobe   mass. This suggests response to therapy.       [place]            Radiologist location ID: [id number]               DNR Status:  Full Code    Assessment/Plan:   Active Hospital Problems    Diagnosis   . Fever and chills     # Neutropenic Fever and Chills   - likely 2/2 to UTI   - follow up urine cultures   - continue Cefepime 1g BID   - follow up blood cultures     # Cellulitis of right buttocks   - doxycycline 100 mg BID given allergic to vancomycin     # Viral Bronchitis   - continue Nebulizers      #Hypertension   - continue Coreg 6.25 mg BID     # AFIB   - continue Eliquis BID   - continue Diltiazem 120 mg daily   - continue flecainide 50 mg BID     # Dermatitis/ candidal infection between gluteus   - nystatin powder     # Diarrhea   - hold linzess   - hold colace     # Hyperlipidemia   - continue Lipitor 10 mg daily     #Transaminitis   - Hepatitis panel on 07/30/2018   - liver ultrasound     # Lung CA   - s/p chemo on 12/11    DVT/PE Prophylaxis: Apixaban  Disposition Planning: Home discharge     Erline Hau, MD

## 2018-08-07 NOTE — ED Provider Notes (Signed)
Department of Emergency [place]    HPI - 08/07/2018       Attending: Dr. Amada Jupiter  Chief Complaint:  Chief Complaint   Patient presents with   . Fever Immunocompromised     Pt is currently receiving chemo and radiation for lung CA. Pt reported a low grade fever this am. Sent by Oncologist for admission to hospital. Also c/o dizziness x 3 days.      History of Present Illness:  Kelsey [name] is a 67 y.o. female with c/o dizziness for the last 3 days, fever (100.9), chest congestion, dry cough, and fatigue. Pt states that she has lung cancer and receives chemotherapy and radiation. Pt was referred here by her oncologist for admission because of leukocytosis and fear of her being immunocompromised. Pt denies vomiting, diarrhea, and all other associated complaints at this time.      History Limitations: None    Review of Systems:  Constitutional: No chills or weakness. + fever (100.9) and fatigue  HENT: No congestion or rhinorrhea   Eyes: No vision changes, no discharge   Cardio: No chest pain, palpitations or leg swelling. + chest congestion   Respiratory: No wheezing or SOB.+ cough  GI:  No nausea, vomiting, diarrhea, constipation or abdominal pain   GU:  No dysuria, hematuria, polyuria   MSK: No joint or back pain   Skin: No rashes or diaphoresis   Neuro: No loss of sensation, headache, focal deficits or LOC.+ dizziness      All other systems reviewed and are negative    Allergies:  Allergies   Allergen Reactions   . Shellfish Containing Products Shortness of Breath, Rash and Itching     scallops   . Vancomycin Shortness of Breath   . Barium Iodide    . Iodine And Iodide Containing Products      Itching, shortness of breath      Past Medical History:  Past Medical History:   Diagnosis Date   . A-fib (CMS HCC)    . Arthropathy, unspecified, site unspecified    . Asthma    . Cancer (CMS HCC)     cervical   . Cataract    . Colon cancer (CMS [place]) 07/06/2014   . COPD (chronic obstructive  pulmonary disease) (CMS HCC)    . Fistula    . HTN (hypertension)    . Hyperlipidemia    . Obesity    . Sleep apnea    . Ventral hernia          Past Surgical History:  Past Surgical History:   Procedure Laterality Date   . ABCESS DRAINAGE     . BOWEL RESECTION     . CATARACT EXTRACTION     . HX CERVICAL CONE BIOPSY      . HX CESAREAN SECTION     . HX GASTRIC BYPASS      25 years ago   . [place] (x2)   . HX HYSTERECTOMY      late 1990s   . HX LAP BANDING      2013   . HX LAPAROTOMY      2013 to remove lap band         Social History:  Social History     Tobacco Use   . Smoking status: Former Research scientist (life sciences)   . Smokeless tobacco: Never Used   . Tobacco comment: quit 10 yrs ago  Substance Use Topics   . Alcohol use: Not Currently     Comment: rarely     Family History:  Family Medical History:     Problem Relation (Age of Onset)    Diabetes Mother, Maternal Grandmother, Maternal Grandfather, Father    Heart Attack Mother    Stroke Father          Physical Exam:  All nurse's notes reviewed.    ED Triage Vitals [08/07/18 1036]   Enc Vitals Group      BP (Non-Invasive) 105/73      Heart Rate 76      Respiratory Rate 16      Temperature 37 C (98.6 F)      Temp src       SpO2 98 %      Weight 130.6 kg (288 lb)      Height 1.575 m (5\' 2" )     Filed Vitals:    08/07/18 1036   BP: 105/73   Pulse: 76   Resp: 16   Temp: 37 C (98.6 F)   SpO2: 98%       Constitutional: WD/WN female in no acute distress.  Alert and Oriented x 3.    HEENT:      Head: NC/AT    Ears: NL external ears.   Eyes:Conjunctiva pink, sclera white, no discharge.   Nose: No drainage or bleeding.        Mouth/Throat: MM moist.    Neck:  Trachea midline.  No deformity.  Cardiovascular: Heart RRR, no gallops.    Pulmonary/Chest: Breath sounds equal bilaterally, good air movement. No respiratory distress. No wheezes, rales. No chest tenderness.    Abdominal: Normal BS. Abdomen soft, ND, NT.    Back: NL inspection, no tenderness.       Musculoskeletal: No obvious deformity or swelling. 2+ peripheral pulses.  Skin: Warm and dry. No rash, erythema, pallor or cyanosis.    Neurological: A+O x 3. Grossly intact. Moves all extremities spontaneously. Face symmetrical, speech NL.        Orders, Abnormal Labs and Imaging Results:    Labs Reviewed   URINALYSIS, MACRO/MICRO - Abnormal; Notable for the following components:       Result Value    COLOR Dark Yellow (*)     SPECIFIC GRAVITY 1.027 (*)     NITRITE Positive (*)     BILIRUBIN Small (*)     RBCS 0-2 (*)     WBCS 0-5 (*)     BACTERIA TNTC (*)     All other components within normal limits   ADULT ROUTINE BLOOD CULTURE, SET OF 2 BOTTLES (BACTERIA AND YEAST)   ADULT ROUTINE BLOOD CULTURE, SET OF 2 BOTTLES (BACTERIA AND YEAST)   URINE CULTURE,ROUTINE   URINALYSIS WITH REFLEX MICROSCOPIC AND CULTURE IF POSITIVE    Narrative:     The following orders were created for panel order URINALYSIS WITH REFLEX MICROSCOPIC AND CULTURE IF POSITIVE.  Procedure                               Abnormality         Status                     ---------                               -----------         ------  URINALYSIS, MACRO/MICRO[[id number]]      Abnormal            Final result                 Please view results for these tests on the individual orders.       XR CHEST PA AND LATERAL   Final Result   1. There has been a slight decrease in size of the known RIGHT lower lobe   mass. This suggests response to therapy.       [place]            Radiologist location ID: [id number]             EKG:  None     MDM:    During the patient's stay in the emergency department, images and/or labs were performed to assist with medical decision making and were reviewed by myself.     Medications   ceFEPime (MAXIPIME) 2g in NS 56mL IVPB minibag (has no administration in time range)     Patient is on chemotherapy for lung cancer.  Has fever.  She was sent here by her oncologist for admission.  Laboratory workup revealed UTI.   Patient was started on Maxipime.  I spoke with hospitalist on call, who agrees to admit.    Consults:  Hospitalist on-call    Impression:   Encounter Diagnoses   Name Primary?   . UTI (urinary tract infection) Yes   . Neutropenia (CMS HCC)    . Immunocompromised (CMS [place])          Disposition:  Admitted      Admission: Patient will be admitted to Hospitalist service #7 for further evaluation and management.    Critical Care: None       I am scribing for, and in the presence of, Dr. Calvert Cantor for services provided on 08/07/2018.  Manson Passey, SCRIBE   Shuna Tabor, [place]  08/07/2018, 11:29    I personally performed the services described in this documentation, as scribed  in my presence, and it is both accurate  and complete.    Amada Jupiter, MD        Parts of this patients chart were completed in a retrospective fashion due to simultaneous direct patient care activities in the Emergency Department.   This note was partially generated using MModal Fluency Direct system, and there may be some incorrect words, spellings, and punctuation that were not noted in checking the note before saving.

## 2018-08-07 NOTE — Nursing Note (Signed)
Navigator: WBC 1.7 Per Dr. Deatra Canter I advised pt to go to the ER. He had called the coordinator and there are not beds available. Pt and husband verbalized understanding.

## 2018-08-07 NOTE — Telephone Encounter (Signed)
Pt husband reported to the front desk while she was getting her radiation and states she had a temp of 100.9  I had them tell him to have her get lab work drawn after radiation and wait for the results.

## 2018-08-07 NOTE — ED Nurses Note (Signed)
Report called to Brenda on 6S. Pt tobe transported by [facility]

## 2018-08-08 ENCOUNTER — Inpatient Hospital Stay (HOSPITAL_COMMUNITY): Payer: Medicare Other | Admitting: Radiology

## 2018-08-08 ENCOUNTER — Inpatient Hospital Stay (HOSPITAL_COMMUNITY)
Admission: RE | Admit: 2018-08-08 | Discharge: 2018-08-08 | Disposition: A | Discharge status: Still In Hospital | Payer: Medicare Other | Source: Ambulatory Visit

## 2018-08-08 DIAGNOSIS — Z87891 Personal history of nicotine dependence: Secondary | ICD-10-CM | POA: Diagnosis not present

## 2018-08-08 DIAGNOSIS — C3431 Malignant neoplasm of lower lobe, right bronchus or lung: Secondary | ICD-10-CM | POA: Diagnosis not present

## 2018-08-08 DIAGNOSIS — D709 Neutropenia, unspecified: Secondary | ICD-10-CM

## 2018-08-08 DIAGNOSIS — C3490 Malignant neoplasm of unspecified part of unspecified bronchus or lung: Secondary | ICD-10-CM

## 2018-08-08 DIAGNOSIS — R5081 Fever presenting with conditions classified elsewhere: Secondary | ICD-10-CM

## 2018-08-08 LAB — CBC WITH DIFF
BASOPHIL #: 0 10*3/uL (ref 0.00–0.20)
BASOPHIL %: 1 %
EOSINOPHIL #: 0 10*3/uL (ref 0.00–0.50)
EOSINOPHIL %: 2 %
HCT: 35.3 % (ref 34.6–46.2)
HGB: 11.5 g/dL — ABNORMAL LOW (ref 11.8–15.8)
LYMPHOCYTE #: 0.8 10*3/uL — ABNORMAL LOW (ref 0.90–3.40)
LYMPHOCYTE %: 43 %
MCH: 25.9 pg — ABNORMAL LOW (ref 27.6–33.2)
MCHC: 32.6 g/dL (ref 32.6–35.4)
MCV: 79.5 fL — ABNORMAL LOW (ref 82.3–96.7)
MONOCYTE #: 0.3 10*3/uL (ref 0.20–0.90)
MONOCYTE %: 15 %
MPV: 8.4 fL (ref 6.6–10.2)
NEUTROPHIL #: 0.8 10*3/uL — ABNORMAL LOW (ref 1.50–6.40)
NEUTROPHIL %: 40 %
PLATELETS: 200 10*3/uL (ref 140–440)
RBC: 4.44 10*6/uL (ref 3.80–5.24)
RDW: 16.2 % — ABNORMAL HIGH (ref 12.4–15.2)
WBC: 2 10*3/uL — ABNORMAL LOW (ref 3.5–10.3)

## 2018-08-08 LAB — BASIC METABOLIC PANEL
ANION GAP: 7 mmol/L
BUN/CREA RATIO: 16
BUN: 14 mg/dL (ref 10–25)
CALCIUM: 8.2 mg/dL — ABNORMAL LOW (ref 8.8–10.3)
CHLORIDE: 111 mmol/L (ref 98–111)
CO2 TOTAL: 24 mmol/L (ref 21–35)
CREATININE: 0.85 mg/dL (ref <=1.30)
ESTIMATED GFR: >60 mL/min/1.73mˆ2
GLUCOSE: 109 mg/dL (ref 70–110)
POTASSIUM: 3.6 mmol/L (ref 3.5–5.0)
SODIUM: 142 mmol/L (ref 135–145)

## 2018-08-08 LAB — MAGNESIUM: MAGNESIUM: 1.6 mg/dL — ABNORMAL LOW (ref 1.8–2.3)

## 2018-08-08 LAB — PT/INR: INR: 1.3 — ABNORMAL HIGH (ref 0.80–1.10)

## 2018-08-08 LAB — PHOSPHORUS
PHOSPHORUS: 4 mg/dL (ref 2.5–4.5)
PHOSPHORUS: 4 mg/dL (ref 2.5–4.5)

## 2018-08-08 MED ORDER — FILGRASTIM 480 MCG/1.6 ML INJECTION SOLUTION
480.0000 ug | Freq: Once | INTRAMUSCULAR | Status: AC
Start: 2018-08-08 — End: 2018-08-08
  Administered 2018-08-08: 480 ug via SUBCUTANEOUS

## 2018-08-08 MED ORDER — ONDANSETRON HCL (PF) 4 MG/2 ML INJECTION SOLUTION
4.00 mg | Freq: Four times a day (QID) | INTRAMUSCULAR | Status: DC | PRN
Start: 2018-08-08 — End: 2018-08-09

## 2018-08-08 MED ORDER — CEFEPIME 2G IN NS 50ML MINIBAG IVPB
2.00 g | Freq: Two times a day (BID) | INTRAMUSCULAR | Status: DC
Start: 2018-08-08 — End: 2018-08-09
  Administered 2018-08-08: 0 g via INTRAVENOUS
  Administered 2018-08-08 – 2018-08-09 (×3): 2 g via INTRAVENOUS
  Administered 2018-08-09: 0 g via INTRAVENOUS
  Filled 2018-08-08 (×5): qty 50

## 2018-08-08 MED ORDER — FILGRASTIM 480 MCG/1.6 ML INJECTION SOLUTION
480.0000 ug | Freq: Once | INTRAMUSCULAR | Status: DC
Start: 2018-08-08 — End: 2018-08-08
  Filled 2018-08-08: qty 1.6

## 2018-08-08 MED ORDER — ACETAMINOPHEN 325 MG TABLET
650.00 mg | ORAL_TABLET | Freq: Four times a day (QID) | ORAL | Status: DC | PRN
Start: 2018-08-08 — End: 2018-08-09

## 2018-08-08 MED ADMIN — tamsulosin 0.4 mg capsule: INTRAVENOUS | @ 17:00:00

## 2018-08-08 MED ADMIN — filgrastim 480 mcg/1.6 mL injection solution: SUBCUTANEOUS | @ 17:00:00

## 2018-08-08 MED ADMIN — lactated Ringers intravenous solution: ORAL | @ 21:00:00 | NDC 00338011704

## 2018-08-08 MED ADMIN — nystatin 100,000 unit/gram topical powder: ORAL | @ 05:00:00 | NDC 00574200815

## 2018-08-08 MED ADMIN — sodium chloride 0.9 % intravenous solution: RESPIRATORY_TRACT | @ 21:00:00 | NDC 00338004904

## 2018-08-08 MED ADMIN — metroNIDAZOLE 250 mg tablet: INTRAVENOUS | @ 05:00:00

## 2018-08-08 MED ADMIN — sodium chloride 0.9 % intravenous solution: ORAL | @ 10:00:00 | NDC 00338004904

## 2018-08-08 MED ADMIN — nystatin 100,000 unit/gram topical powder: TOPICAL | @ 10:00:00 | NDC 00574200815

## 2018-08-08 MED ADMIN — diphenhydrAMINE 50 mg capsule: RESPIRATORY_TRACT | @ 10:00:00

## 2018-08-08 NOTE — Care Plan (Signed)
Problem: UTI (Urinary Tract Infection)  Goal: Improved Infection Symptoms  Outcome: Ongoing (see interventions/notes)     Problem: Fever (Fever with Neutropenia)  Goal: Baseline Body Temperature  Outcome: Ongoing (see interventions/notes)     Problem: Infection Risk (Fever with Neutropenia)  Goal: Absence of Infection  Outcome: Ongoing (see interventions/notes)     Problem: Fall Injury Risk  Goal: Absence of Fall and Fall-Related Injury  Outcome: Ongoing (see interventions/notes)     Problem: Fall Injury Risk  Goal: Absence of Fall and Fall-Related Injury  Outcome: Ongoing (see interventions/notes)

## 2018-08-08 NOTE — Consults (Signed)
[facility]  Initial Note    Kelsey [name], Roedel  Date of Admission:  08/07/2018  Date of Service: 08/08/2018  Date of Birth:  March 08, 1951    Requesting MD: Kathrine Haddock    Assessment/Plan:  67 y.o. 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    1. Neutropenic fever:  - likely due to UTI.  Urine culture positive for gram-negative rods.  - Agree with IV antibiotics.  -Discussed Neupogen with patient which she refused.  It is reasonable to continue observation with IV antibiotics.  - if [place] is greater than 1500 over the weekend patient can be discharged home on p.o. Antibiotics.      2. Stage III adenocarcinoma lung:  -Currently on chemoradiation.        HPI/Discussion: 67 y.o. female with stage III adenocarcinoma lung currently on chemoradiation with carboplatin/Taxol admitted to hospital with neutropenic fever.  Patient report of fever with T-max of 100.9 at home.  Also complained of chills.  Patient did report of runny nose, productive cough and congestion.  Symptom is been going on for 3-4 days before admission.  Patient also report of dysuria.  Denies any nausea or vomiting.    REVIEW OF SYSTEMS  Other than ROS in the HPI, all other systems were negative.    Past Medical History:   Diagnosis Date   . A-fib (CMS HCC)    . Arthropathy, unspecified, site unspecified    . Asthma    . Cancer (CMS HCC)     cervical   . Cataract    . Colon cancer (CMS [place]) 07/06/2014   . COPD (chronic obstructive pulmonary disease) (CMS HCC)    . Fistula    . HTN (hypertension)    . Hyperlipidemia    . Obesity    . Sleep apnea    . Ventral hernia          Past Surgical History:   Procedure Laterality Date   . ABCESS DRAINAGE     . BOWEL RESECTION     . CATARACT EXTRACTION     . HX CERVICAL CONE BIOPSY      . HX CESAREAN SECTION     . HX GASTRIC BYPASS      25 years ago   . [place] (x2)   . HX HYSTERECTOMY      late 1990s   . HX LAP BANDING      2013   . HX LAPAROTOMY      2013 to remove lap band         Medications Prior to Admission     Prescriptions    apixaban (ELIQUIS) 5 mg Oral Tablet    Take 1 Tab (5 mg total) by mouth Twice daily    atorvastatin (LIPITOR) 10 mg Oral Tablet    Take 10 mg by mouth Once a day    budesonide-formoterol (SYMBICORT) 160-4.5 mcg/actuation Inhalation HFA Aerosol Inhaler    Take 2 Puffs by inhalation Twice daily    CARVEDILOL ORAL    Take 6.25 mg by mouth Every evening     docusate sodium (COLACE) 100 mg Oral Capsule    Take 100 mg by mouth Three times a day as needed     DULoxetine (CYMBALTA DR) 30 mg Oral Capsule, Delayed Release(E.C.)    Take 30 mg by mouth Once a day  flecainide (TAMBOCOR) 50 mg Oral Tablet    Take 1 Tab (50 mg total) by mouth Twice daily    linaCLOtide (LINZESS) 72 mcg Oral Capsule    Take 72 mcg by mouth Every morning    loperamide (IMODIUM) 2 mg Oral Capsule    Take 2 mg by mouth Every 4 hours as needed    magnesium oxide (MAG-OX) 400 mg Oral Tablet    Take 400 mg by mouth Once a day    omeprazole (PRILOSEC) 20 mg Oral Capsule, Delayed Release(E.C.)    Take 40 mg by mouth Once a day     potassium chloride (K-DUR) 20 mEq Oral Tab Sust.Rel. Particle/Crystal    Take 20 mEq by mouth Once a day    prochlorperazine (COMPAZINE) 10 mg Oral Tablet    Take 1 Tab (10 mg total) by mouth Four times a day as needed for nausea/vomiting        albuterol (PROVENTIL) 2.5 mg / 3 mL (0.083%) neb solution, 2.5 mg, Nebulization, Q6H  apixaban (ELIQUIS) tablet, 5 mg, Oral, 2x/day  atorvastatin (LIPITOR) tablet, 10 mg, Oral, Daily  budesonide (PULMICORT RESPULES) 0.5 mg/2 mL nebulizer suspension, 0.5 mg, Nebulization, 2x/day  carvedilol (COREG) tablet, 6.25 mg, Oral, QPM  ceFEPime (MAXIPIME) 2g in NS 50mL IVPB minibag, 2 g, Intravenous, Q12H  dilTIAZem (CARDIZEM CD) 24 hr extended release capsule, 120 mg, Oral, Daily  doxycycline hyclate  (VIBRAMYCIN) capsule, 100 mg, Oral, 2x/day  DULoxetine (CYMBALTA) delayed release capsule, 30 mg, Oral, Daily  flecainide (TAMBORCOR) tablet, 50 mg, Oral, 2x/day  lisinopril (PRINIVIL) tablet, 5 mg, Oral, Daily  NS flush syringe, 3 mL, Intracatheter, Q8HRS  NS flush syringe, 3 mL, Intracatheter, Q1H PRN  nystatin (NYSTOP) 100,000 units/g topical powder, , Apply Topically, 2x/day  pantoprazole (PROTONIX) delayed release tablet, 40 mg, Oral, Daily before Breakfast  traZODone (DESYREL) tablet, 50 mg, Oral, HS PRN      Allergies   Allergen Reactions   . Shellfish Containing Products Shortness of Breath, Rash and Itching     scallops   . Vancomycin Shortness of Breath   . Barium Iodide    . Iodine And Iodide Containing Products      Itching, shortness of breath      Family History  Family Medical History:     Problem Relation (Age of Onset)    Diabetes Mother, Maternal Grandmother, Maternal Grandfather, Father    Heart Attack Mother    Stroke Father          Social History  Social History     Socioeconomic History   . Marital status: Married     Spouse name: Not on file   . Number of children: Not on file   . Years of education: Not on file   . Highest education level: Not on file   Occupational History   . Not on file   Social Needs   . Financial resource strain: Not on file   . Food insecurity:     Worry: Not on file     Inability: Not on file   . Transportation needs:     Medical: Not on file     Non-medical: Not on file   Tobacco Use   . Smoking status: Former Research scientist (life sciences)   . Smokeless tobacco: Never Used   . Tobacco comment: quit 10 yrs ago   Substance and Sexual Activity   . Alcohol use: Not Currently     Comment: rarely   . Drug use:  No   . Sexual activity: Not on file   Lifestyle   . Physical activity:     Days per week: Not on file     Minutes per session: Not on file   . Stress: Not on file   Relationships   . Social connections:     Talks on phone: Not on file     Gets together: Not on file     Attends religious  service: Not on file     Active member of club or organization: Not on file     Attends meetings of clubs or organizations: Not on file     Relationship status: Not on file   . Intimate partner violence:     Fear of current or ex partner: Not on file     Emotionally abused: Not on file     Physically abused: Not on file     Forced sexual activity: Not on file   Other Topics Concern   . Ability to Walk 1 Flight of Steps without SOB/CP No   . Routine Exercise Not Asked   . Ability to Walk 2 Flight of Steps without SOB/CP Not Asked   . Unable to Ambulate Not Asked   . Total Care Not Asked   . Ability To Do Own ADL's Yes   . Uses Walker Not Asked   . Other Activity Level Not Asked   . Uses Cane Not Asked   Social History Narrative   . Not on file       Objective     EXAM  VITALS:    Temperature: 36.4 C (97.5 F)  Heart Rate: 79  BP (Non-Invasive): (!) 152/87  Respiratory Rate: 18  SpO2: 100 %  Pain Score (Numeric, Faces): 0  Constitutional:  appears chronically ill  Respiratory:  Clear to auscultation bilaterally.   Cardiovascular:  regular rate and rhythm  Gastrointestinal:  Soft, non-tender  Musculoskeletal:  Head atraumatic and normocephalic  Neurologic:  Grossly normal    IMAGES:   US LIVER   Final Result   Fatty liver.            Radiologist location ID: [id number]         XR CHEST PA AND LATERAL   Final Result   1. There has been a slight decrease in size of the known RIGHT lower lobe   mass. This suggests response to therapy.       [place]            Radiologist location ID: [id number]               LABS:    I have reviewed all lab results.  Lab Results Today:    Results for orders placed or performed during the hospital encounter of 08/07/18 (from the past 24 hour(s))   BASIC METABOLIC PANEL, NON-FASTING   Result Value Ref Range    SODIUM 142 135 - 145 mmol/L    POTASSIUM 3.6 3.5 - 5.0 mmol/L    CHLORIDE 111 98 - 111 mmol/L    CO2 TOTAL 24 21 - 35 mmol/L    ANION GAP 7 mmol/L    CALCIUM 8.2 (L) 8.8 - 10.3 mg/dL     GLUCOSE 109 70 - 110 mg/dL    BUN 14 10 - 25 mg/dL    CREATININE 0.85 <=1.30 mg/dL    BUN/CREA RATIO 16     ESTIMATED GFR >60 Avg: 85 mL/min/1.77m^2   MAGNESIUM   Result  Value Ref Range    MAGNESIUM 1.6 (L) 1.8 - 2.3 mg/dL   PHOSPHORUS   Result Value Ref Range    PHOSPHORUS 4.0 2.5 - 4.5 mg/dL   PT/INR   Result Value Ref Range    INR 1.30 (H) 0.80 - 1.10   CBC WITH DIFF   Result Value Ref Range    WBC 2.0 (L) 3.5 - 10.3 x10^3/uL    RBC 4.44 3.80 - 5.24 x10^6/uL    HGB 11.5 (L) 11.8 - 15.8 g/dL    HCT 35.3 34.6 - 46.2 %    MCV 79.5 (L) 82.3 - 96.7 fL    MCH 25.9 (L) 27.6 - 33.2 pg    MCHC 32.6 32.6 - 35.4 g/dL    RDW 16.2 (H) 12.4 - 15.2 %    PLATELETS 200 140 - 440 x10^3/uL    MPV 8.4 6.6 - 10.2 fL    NEUTROPHIL % 40 %    LYMPHOCYTE % 43 %    MONOCYTE % 15 %    EOSINOPHIL % 2 %    BASOPHIL % 1 %    NEUTROPHIL # 0.80 (L) 1.50 - 6.40 x10^3/uL    LYMPHOCYTE # 0.80 (L) 0.90 - 3.40 x10^3/uL    MONOCYTE # 0.30 0.20 - 0.90 x10^3/uL    EOSINOPHIL # 0.00 0.00 - 0.50 x10^3/uL    BASOPHIL # 0.00 0.00 - 0.20 x10^3/uL         Vanetta Mulders, MD

## 2018-08-08 NOTE — Pharmacy (Signed)
Patient evaluated for renally-adjusted medications. Patient's BUN 14, SCr 0.85, estimated crCl 83 mL/min. Will adjust Cefepime to 2 gm IV q12h per dosing recommendations approved by P&T.

## 2018-08-08 NOTE — Care Plan (Signed)
END OF SHIFT NOTE:  Patient had no complaints of pain.  Ambulates with walker.  NPO after midnight for US liver this AM.  No questions, concerns, or complaints at this time.  Will continue to monitor.    Bobbie Remi Deter, RN

## 2018-08-08 NOTE — Progress Notes (Signed)
[facility]  Hospitalist Progress Note    Kelsey [name]       67 y.o.       Date of service: 08/08/2018  Date of Admission:  08/07/2018    Hospital Day:  LOS: 1 day   CC: Neutropenic fever (CMS HCC)    Subjective:  Patient still feels cold.  She slept in the chair overnight.  No cough or sputum today.  Tolerating diet.  The area over her right buttock does occasionally burned has been there for over a week.      Objective:   Filed Vitals:    08/07/18 1815 08/07/18 1930 08/07/18 2330 08/08/18 0735   BP: (!) 138/57 (!) 155/81 (!) 118/58 (!) 152/87   Pulse: 87 90 86 79   Resp: 15 18 18 18    Temp:  36.5 C (97.7 F) 36.8 C (98.2 F) 36.4 C (97.5 F)   SpO2: 96% 97% 98% 100%     O2 delivery: None (Room Air)     I have reviewed the vitals.      Input/Output    Intake/Output Summary (Last 24 hours) at 08/08/2018 1107  Last data filed at 08/08/2018 1000  Gross per 24 hour   Intake 360 ml   Output --   Net 360 ml         albuterol (PROVENTIL) 2.5 mg / 3 mL (0.083%) neb solution, 2.5 mg, Nebulization, Q6H  apixaban (ELIQUIS) tablet, 5 mg, Oral, 2x/day  atorvastatin (LIPITOR) tablet, 10 mg, Oral, Daily  budesonide (PULMICORT RESPULES) 0.5 mg/2 mL nebulizer suspension, 0.5 mg, Nebulization, 2x/day  carvedilol (COREG) tablet, 6.25 mg, Oral, QPM  ceFEPime (MAXIPIME) 1g in NS 63mL IVPB minibag, 1 g, Intravenous, Q12H  dilTIAZem (CARDIZEM CD) 24 hr extended release capsule, 120 mg, Oral, Daily  doxycycline hyclate (VIBRAMYCIN) capsule, 100 mg, Oral, 2x/day  DULoxetine (CYMBALTA) delayed release capsule, 30 mg, Oral, Daily  flecainide (TAMBORCOR) tablet, 50 mg, Oral, 2x/day  lisinopril (PRINIVIL) tablet, 5 mg, Oral, Daily  NS flush syringe, 3 mL, Intracatheter, Q8HRS  NS flush syringe, 3 mL, Intracatheter, Q1H PRN  NS premix infusion, , Intravenous, Continuous  nystatin (NYSTOP) 100,000 units/g topical powder, , Apply Topically, 2x/day  pantoprazole (PROTONIX) delayed release tablet, 40 mg, Oral, Daily before  Breakfast  traZODone (DESYREL) tablet, 50 mg, Oral, HS PRN        Physical Exam:  General: No acute distress, converses easily  HEENT: Oropharynx is clear without lesions.  Cardiac: Regular rate and rhythm, no murmur auscultated.  Respiratory:  Overall clear  Abdomen: Positive bowel sounds, soft, nontender, morbidly obese  Extremities: No peripheral edema noted on exam.  Skin:  There are 2 erythematous areas in the upper right buttock that are linear in nature, no pustules, no fluctuance.    Labs:   CBC  (Last 24 hours)    Date/Time WBC HGB HCT MCV PLATELETS    08/08/18 0427 2.0 (L)    11.5 (L)    35.3    79.5 (L)    200           BMP  (Last 24 hours)    Date/Time Na K Cl CO2 BUN CREAT Calcium Glucose    08/08/18 0427 142    3.6    111    24    14    0.85    8.2 (L)    109               I have  reviewed all labs.    Micro:   Hospital Encounter on 08/07/18 (from the past 96 hour(s))   URINE CULTURE,ROUTINE    Collection Time: 08/07/18 12:32 PM   Culture Result Status    URINE CULTURE 100000 Gram Negative Rods (A) Preliminary       Radiology:    Results for orders placed or performed during the hospital encounter of 08/07/18 (from the past 24 hour(s))   XR CHEST PA AND LATERAL     Status: None    Narrative    Malillany Pinson    PROCEDURE DESCRIPTION: XR CHEST PA AND LATERAL.  PROCEDURE PERFORMED DATE AND TIME: 08/07/2018 1:04 PM.  CLINICAL INDICATION: chest pain.  TECHNIQUE: 2 view(s) / 2 image(s) submitted.  COMPARISON: None 17 2019.    FINDINGS:   A RIGHT Port-A-Cath has a stable configuration. The distal catheter tip  projects over SVC. The known RIGHT lower lobe mass measures about 52 mm  transverse by 81 mm craniocaudal. It previously measured about 62 mm  transverse by 82 mm craniocaudal. Subjectively, it has decreased in size  slightly. No new nodule is identified. No pneumothorax or pleural effusion  is appreciated. The heart size is stable. No acute bony abnormality is  appreciated.       Impression    1. There  has been a slight decrease in size of the known RIGHT lower lobe  mass. This suggests response to therapy.     [place]        Radiologist location ID: [id number]     US LIVER     Status: None    Narrative    [place]  Female, 67 years old.    US LIVER performed on 08/08/2018 7:42 AM.    REASON FOR EXAM:  transaminitis    TECHNIQUE: Transabdominal ultrasound of the liver was performed.    COMPARISON: CT abdomen pelvis from April 04, 2015.    FINDINGS:           The liver is normal in size measuring 18.6 cm in length. There is evidence  of hepatic steatosis. No focal liver lesions are seen.  Doppler confirms  patency and appropriate direction of flow within the portal and hepatic  veins.    No abnormal intra-or extrahepatic biliary ductal dilation is identified.   The common bile duct measures 3 mm.          Impression    Fatty liver.        Radiologist location ID: [id number]         PT/OT: No    Consults:  Oncology    Assessment/ Plan:   Active Hospital Problems    Diagnosis   . Fever and chills     Neutropenic Fever   -likely due to UTI   -follow urine cultures   -continue Cefepime 1g BID   -follow blood cultures     Cellulitis of right buttocks   -exam more consistent with herpes zoster    Viral Bronchitis   -continue scheduled nebulizer treatments  -continue doxycycline    Hypertension   -continue Coreg     AFIB   -continue Eliquis BID   -continue Diltiazem 120 mg daily   -continue flecainide 50 mg BID     Dermatitis/ candidal infection between gluteus   -nystatin powder     Diarrhea   -holding Linzess and Colace    Hyperlipidemia   -continue Lipitor    Transaminitis   -  hepatitis panel on 07/30/2018   -liver ultrasound today shows fatty liver    Stage III lung adenocarcinoma  -s/p chemo on 12/11 with carboplatin/Taxol    Stage III lung cancer  -undergoing radiation    DVT/PE Prophylaxis: Apixaban    Disposition Planning: Home discharge      Kathrine Haddock, DO

## 2018-08-08 NOTE — Care Plan (Signed)
Patient had no complaints of pain.  Ambulates with walker.  NPO after midnight for US liver this AM.  No questions, concerns, or complaints at this time.  Will continue to monitor.  Michiel Cowboy, RN

## 2018-08-09 DIAGNOSIS — J45909 Unspecified asthma, uncomplicated: Secondary | ICD-10-CM

## 2018-08-09 LAB — CBC WITH DIFF
BASOPHIL #: 0 x10ˆ3/uL (ref 0.00–0.20)
BASOPHIL %: 1 %
EOSINOPHIL #: 0 x10ˆ3/uL (ref 0.00–0.50)
EOSINOPHIL %: 1 %
HCT: 36.5 % (ref 34.6–46.2)
HGB: 11.8 g/dL (ref 11.8–15.8)
LYMPHOCYTE #: 0.8 x10ˆ3/uL — ABNORMAL LOW (ref 0.90–3.40)
LYMPHOCYTE %: 19 %
MCH: 25.7 pg — ABNORMAL LOW (ref 27.6–33.2)
MCHC: 32.4 g/dL — ABNORMAL LOW (ref 32.6–35.4)
MCV: 79.4 fL — ABNORMAL LOW (ref 82.3–96.7)
MONOCYTE #: 0.6 x10ˆ3/uL (ref 0.20–0.90)
MONOCYTE %: 13 %
MPV: 8.1 fL (ref 6.6–10.2)
NEUTROPHIL #: 2.9 x10ˆ3/uL (ref 1.50–6.40)
NEUTROPHIL %: 67 %
PLATELETS: 198 x10ˆ3/uL (ref 140–440)
RBC: 4.6 x10ˆ6/uL (ref 3.80–5.24)
RDW: 16.7 % — ABNORMAL HIGH (ref 12.4–15.2)
WBC: 4.4 x10ˆ3/uL (ref 3.5–10.3)

## 2018-08-09 LAB — COMPREHENSIVE METABOLIC PANEL, NON-FASTING
ALBUMIN: 3.4 g/dL (ref 3.2–4.6)
ALKALINE PHOSPHATASE: 85 U/L (ref 20–130)
ALT (SGPT): 87 U/L — ABNORMAL HIGH (ref <=52)
ANION GAP: 6 mmol/L
AST (SGOT): 49 U/L — ABNORMAL HIGH (ref <=35)
BILIRUBIN TOTAL: 0.6 mg/dL (ref 0.3–1.2)
BUN/CREA RATIO: 14
BUN: 11 mg/dL (ref 10–25)
CALCIUM: 8.5 mg/dL — ABNORMAL LOW (ref 8.8–10.3)
CHLORIDE: 110 mmol/L (ref 98–111)
CO2 TOTAL: 25 mmol/L (ref 21–35)
CREATININE: 0.79 mg/dL (ref <=1.30)
ESTIMATED GFR: >60 mL/min/1.73mˆ2
GLUCOSE: 103 mg/dL (ref 70–110)
POTASSIUM: 4.2 mmol/L (ref 3.5–5.0)
PROTEIN TOTAL: 5.4 g/dL — ABNORMAL LOW (ref 6.0–8.3)
SODIUM: 141 mmol/L (ref 135–145)

## 2018-08-09 LAB — URINE CULTURE,ROUTINE: URINE CULTURE: 100000 — AB

## 2018-08-09 MED ORDER — ACETAMINOPHEN 325 MG TABLET
650.00 mg | ORAL_TABLET | Freq: Four times a day (QID) | ORAL | 0 refills | Status: DC | PRN
Start: 2018-08-09 — End: 2018-09-25

## 2018-08-09 MED ORDER — HEPARIN, PORCINE (PF) 10 UNIT/ML INTRAVENOUS SYRINGE
3.0000 mL | INJECTION | Freq: Once | INTRAVENOUS | Status: AC
Start: 2018-08-09 — End: 2018-08-09
  Administered 2018-08-09: 3 mL

## 2018-08-09 MED ORDER — CEPHALEXIN 500 MG CAPSULE: 500 mg | Cap | Freq: Three times a day (TID) | ORAL | 0 refills | 0 days | Status: DC

## 2018-08-09 MED ORDER — ALBUTEROL SULFATE 2.5 MG/3 ML (0.083 %) SOLUTION FOR NEBULIZATION
2.50 mg | INHALATION_SOLUTION | Freq: Four times a day (QID) | RESPIRATORY_TRACT | 3 refills | Status: DC
Start: 2018-08-09 — End: 2021-01-09

## 2018-08-09 MED ADMIN — albuterol sulfate 2.5 mg/3 mL (0.083 %) solution for nebulization: RESPIRATORY_TRACT | @ 04:00:00

## 2018-08-09 MED ADMIN — heparin, porcine (PF) 10 unit/mL intravenous syringe: @ 13:00:00

## 2018-08-09 NOTE — Discharge Summary (Signed)
PATIENT NAME: Kelsey [name] NUMBER:  [id number]  ADMIT DATE:  08/07/2018  DISCHARGE DATE:    DATE OF BIRTH:  [birth year]    HOSPITALIST DISCHARGE SUMMARY      PRIMARY CARE PHYSICIAN:  Located at [facility]    DISCHARGE DIAGNOSES:  1. Neutropenic fever.  2. Possible viral URI.  3. Escherichia coli urinary tract infection.  4. Stage III lung adenocarcinoma, currently on chemotherapy with carboplatin/Taxol as well as undergoing radiation therapy.  5. Probable herpes zoster of the right buttocks, with crusted lesions  6. Hypertension.  7. Atrial fibrillation on Eliquis.  8. Candida of the abdominal folds.  9. Hyperlipidemia.  10. Transaminitis with liver ultrasound showing fatty liver.  11. History of colon cancer.  12. Morbid obesity with BMI of 52.    DISCHARGE MEDICATIONS:  1. Keflex 500 mg t.i.d. x7 additional days.  2. Tylenol 650 mg every 6 hours as needed.  3. Albuterol nebulizer treatments 3 mL every 6 hours as needed.  4. Eliquis 5 mg twice daily.  5. Lipitor 10 mg daily.  6. Symbicort 160/4.5 two puffs b.i.d.  7. Coreg 6.25 mg each evening.  8. Colace 100 mg t.i.d. as needed.  9. Cymbalta 30 mg daily.  10. Flecainide 50 mg twice daily.  11. Linzess 72 mcg daily.  12. Imodium 2 mg as needed.  13. Magnesium oxide 400 mg daily.  14. Omeprazole 40 mg daily.  15. Potassium 20 mEq daily.  16. Compazine 10 mg every 6 hours as needed for nausea.    REASON FOR HOSPITALIZATION:  The patient is a very pleasant 67 year old white female who has stage III lung adenocarcinoma currently undergoing chemotherapy with carboplatin and Taxol with last treatment on July 30, 2018.  She is also undergoing radiation to the lung.  The patient presented the Emergency Department for evaluation of fever and chills.  The patient reported a fever of 100.9 at home.  She reported a cough with clear sputum as well as associated wheezing.  She does report a history of underlying asthma.   The patient reported chronic diarrhea, but Linzess and several other medications were listed on her home medication list.  She was subsequently referred for admission.    HOSPITAL COURSE:  The patient was admitted to the medical floor.  Workup on presentation included a white count of 1.7 with normal renal function.  Liver enzymes were slightly elevated with AST of 79, ALT of 115.  Chest x-ray on admission showed slight decrease in size of known right lower lobe mass.  Urinalysis was nitrite positive and subsequently positive for a pansensitive E coli.  Blood cultures from presentation have remained no growth.  The patient was initially started on gentle IV fluids as well as empiric cefepime and doxycycline in the setting of her cough and neutropenia.  She was followed by Dr. Deatra Canter, her primary oncologist.  There was a question of a cellulitis of the right buttock, however, on exam, it is linear in nature and red, but currently with scabbing to the area.  No open lesions.  By clinical exam, I think this is more consistent with herpes zoster rather than infection.  The patient does endorse some burning at the site.  The patient was also found to have mildly elevated liver enzymes and an ultrasound of the liver showed only fatty liver changes.  Her liver enzymes have improved on the date of discharge.  The patient received breathing treatments in  the hospital setting and felt that these markedly helped her symptoms.  She is going to be arranged a home nebulizer and provided a prescription for albuterol to use at home.  Otherwise, the patient is vitally stable and anxious for discharge to home, she is very relieved to be going home.  Her white count improved to 4.4 on date of discharge.  She is scheduled for chemotherapy on Monday.        Kathrine Haddock, DO          CC:   Vanetta Mulders, MD   Fax: [phone]       DD:  08/09/2018 14:18:16  DT:  08/09/2018 15:01:55 PM  D#:  [id number]

## 2018-08-09 NOTE — Care Management Notes (Signed)
[facility] Management Note    Patient Name: Kelsey [name]  Date of Birth: 1951/02/10  Sex: female  Date/Time of Admission: 08/07/2018 10:38 AM  Room/Bed: 6301/A  Payor: MEDICARE / Plan: MEDICARE PART A AND B / Product Type: Medicare /    LOS: 2 days   PCP: [facility]    Admitting Diagnosis:  Fever and chills [R50.9]    Assessment:   By provider request, met with patient regarding nebulizer. Patient being discharged. Provider already informed patient that nebulizer would be delivered to her home next week.    SW met briefly with patient. Patient understood that nebulizer would not be delivered until Monday to her home. Patient completed FOC & PPF, patient chose [facility]. SW provided phone for patient to contact her husband regarding discharge. Patient had no further questions or concerns for SW at this time.    Discharge Plan:  Home with DME (code 1)    FOC, PPF and nebulizer orders uploaded to AS, placed on chart. Referral sent to [facility] through AS.    Patient to be discharged home with home delivery of nebulizer from [place].    The patient will continue to be evaluated for developing discharge needs.     Case Manager: Vira Agar, [place]  Phone: [phone]

## 2018-08-09 NOTE — Care Plan (Signed)
Pt alert and oriented. Ambulating to bathroom independantly with walker. No c/o pain noted. Tolerated all meds. No fever throughout shift. No questions or concerns at this time. Kennith Gain, RN

## 2018-08-09 NOTE — Progress Notes (Signed)
08/09/2018  10:15      Brief Hospitalist Discharge Day Note:    Patient seen and examined at bedside.  Patient feeling much better today.  She does feel that the breathing treatments help her breathing and asks if we can arrange a machine for her at home which I have facilitated.  Urine is positive for pansensitive E coli.  Will complete a course of oral Keflex.  Her [place] has improved and white count is 4.4.  Plan discharge to home today.    Greater than 35 minutes spent on discharge day plan of care    ID# [id number]        Kathrine Haddock, DO

## 2018-08-11 ENCOUNTER — Ambulatory Visit (HOSPITAL_COMMUNITY)
Admission: RE | Admit: 2018-08-11 | Discharge: 2018-08-11 | Disposition: A | Discharge status: Home / Self Care | Payer: Medicare Other | Source: Ambulatory Visit

## 2018-08-11 ENCOUNTER — Inpatient Hospital Stay (HOSPITAL_COMMUNITY)
Admission: RE | Admit: 2018-08-11 | Discharge: 2018-08-11 | Disposition: A | Discharge status: Still In Hospital | Payer: Medicare Other | Source: Ambulatory Visit

## 2018-08-11 VITALS — BP 125/59 | HR 75 | Temp 97.7°F | Resp 20

## 2018-08-11 DIAGNOSIS — Z7951 Long term (current) use of inhaled steroids: Secondary | ICD-10-CM | POA: Diagnosis not present

## 2018-08-11 DIAGNOSIS — Z79899 Other long term (current) drug therapy: Secondary | ICD-10-CM | POA: Diagnosis not present

## 2018-08-11 DIAGNOSIS — C3431 Malignant neoplasm of lower lobe, right bronchus or lung: Secondary | ICD-10-CM | POA: Diagnosis not present

## 2018-08-11 DIAGNOSIS — Z85038 Personal history of other malignant neoplasm of large intestine: Secondary | ICD-10-CM | POA: Diagnosis not present

## 2018-08-11 DIAGNOSIS — Z5111 Encounter for antineoplastic chemotherapy: Secondary | ICD-10-CM | POA: Diagnosis not present

## 2018-08-11 DIAGNOSIS — Z8541 Personal history of malignant neoplasm of cervix uteri: Secondary | ICD-10-CM | POA: Diagnosis not present

## 2018-08-11 DIAGNOSIS — J45909 Unspecified asthma, uncomplicated: Secondary | ICD-10-CM | POA: Diagnosis not present

## 2018-08-11 DIAGNOSIS — I4891 Unspecified atrial fibrillation: Secondary | ICD-10-CM | POA: Diagnosis not present

## 2018-08-11 DIAGNOSIS — J449 Chronic obstructive pulmonary disease, unspecified: Secondary | ICD-10-CM | POA: Diagnosis not present

## 2018-08-11 DIAGNOSIS — Z7982 Long term (current) use of aspirin: Secondary | ICD-10-CM | POA: Diagnosis not present

## 2018-08-11 DIAGNOSIS — I10 Essential (primary) hypertension: Secondary | ICD-10-CM | POA: Diagnosis not present

## 2018-08-11 DIAGNOSIS — M1990 Unspecified osteoarthritis, unspecified site: Secondary | ICD-10-CM | POA: Diagnosis not present

## 2018-08-11 DIAGNOSIS — D6959 Other secondary thrombocytopenia: Secondary | ICD-10-CM | POA: Diagnosis not present

## 2018-08-11 DIAGNOSIS — Z510 Encounter for antineoplastic radiation therapy: Secondary | ICD-10-CM | POA: Diagnosis not present

## 2018-08-11 DIAGNOSIS — G4730 Sleep apnea, unspecified: Secondary | ICD-10-CM | POA: Diagnosis not present

## 2018-08-11 DIAGNOSIS — E785 Hyperlipidemia, unspecified: Secondary | ICD-10-CM | POA: Diagnosis not present

## 2018-08-11 DIAGNOSIS — T451X5A Adverse effect of antineoplastic and immunosuppressive drugs, initial encounter: Secondary | ICD-10-CM | POA: Diagnosis not present

## 2018-08-11 DIAGNOSIS — Z87891 Personal history of nicotine dependence: Secondary | ICD-10-CM | POA: Diagnosis not present

## 2018-08-11 DIAGNOSIS — Z7901 Long term (current) use of anticoagulants: Secondary | ICD-10-CM | POA: Diagnosis not present

## 2018-08-11 DIAGNOSIS — R112 Nausea with vomiting, unspecified: Secondary | ICD-10-CM | POA: Diagnosis not present

## 2018-08-11 DIAGNOSIS — C3490 Malignant neoplasm of unspecified part of unspecified bronchus or lung: Secondary | ICD-10-CM

## 2018-08-11 LAB — COMPREHENSIVE METABOLIC PANEL, NON-FASTING
ALBUMIN: 3.3 g/dL (ref 3.2–4.6)
ALKALINE PHOSPHATASE: 79 U/L (ref 20–130)
ALT (SGPT): 52 U/L (ref <=52)
ALT (SGPT): 52 U/L (ref <=52)
ANION GAP: 4 mmol/L
AST (SGOT): 25 U/L (ref <=35)
BILIRUBIN TOTAL: 0.5 mg/dL (ref 0.3–1.2)
BUN/CREA RATIO: 18
BUN: 14 mg/dL (ref 10–25)
CALCIUM: 8.2 mg/dL — ABNORMAL LOW (ref 8.8–10.3)
CHLORIDE: 110 mmol/L (ref 98–111)
CO2 TOTAL: 27 mmol/L (ref 21–35)
CREATININE: 0.8 mg/dL (ref <=1.30)
CREATININE: 0.8 mg/dL (ref <=1.30)
ESTIMATED GFR: >60 mL/min/1.73mˆ2
GLUCOSE: 142 mg/dL — ABNORMAL HIGH (ref 70–110)
POTASSIUM: 3.7 mmol/L (ref 3.5–5.0)
POTASSIUM: 3.7 mmol/L (ref 3.5–5.0)
PROTEIN TOTAL: 5.5 g/dL — ABNORMAL LOW (ref 6.0–8.3)
SODIUM: 141 mmol/L (ref 135–145)

## 2018-08-11 LAB — CBC WITH DIFF
BASOPHIL #: 0 x10ˆ3/uL (ref 0.00–0.20)
BASOPHIL %: 1 %
EOSINOPHIL #: 0 x10ˆ3/uL (ref 0.00–0.50)
EOSINOPHIL %: 1 %
HCT: 35.4 % (ref 34.6–46.2)
HGB: 11.5 g/dL — ABNORMAL LOW (ref 11.8–15.8)
LYMPHOCYTE #: 0.8 x10ˆ3/uL — ABNORMAL LOW (ref 0.90–3.40)
LYMPHOCYTE %: 19 %
MCH: 25.8 pg — ABNORMAL LOW (ref 27.6–33.2)
MCHC: 32.4 g/dL — ABNORMAL LOW (ref 32.6–35.4)
MCV: 79.7 fL — ABNORMAL LOW (ref 82.3–96.7)
MONOCYTE #: 0.6 x10ˆ3/uL (ref 0.20–0.90)
MONOCYTE %: 15 %
MPV: 7.9 fL (ref 6.6–10.2)
NEUTROPHIL #: 2.6 x10ˆ3/uL (ref 1.50–6.40)
NEUTROPHIL %: 64 %
PLATELETS: 173 10*3/uL (ref 140–440)
RBC: 4.44 x10ˆ6/uL (ref 3.80–5.24)
RDW: 16.4 % — ABNORMAL HIGH (ref 12.4–15.2)
WBC: 4.1 10*3/uL (ref 3.5–10.3)
WBC: 4.1 x10ˆ3/uL (ref 3.5–10.3)

## 2018-08-11 MED ORDER — SODIUM CHLORIDE 0.9 % INTRAVENOUS SOLUTION
Freq: Once | INTRAVENOUS | Status: AC
Start: 2018-08-11 — End: 2018-08-11
  Filled 2018-08-11: qty 2

## 2018-08-11 MED ORDER — SODIUM CHLORIDE 0.9 % INTRAVENOUS SOLUTION
226.4000 mg | Freq: Once | INTRAVENOUS | Status: AC
Start: 2018-08-11 — End: 2018-08-11
  Administered 2018-08-11: 225 mg via INTRAVENOUS
  Filled 2018-08-11: qty 22.5

## 2018-08-11 MED ORDER — FAMOTIDINE (PF) 20 MG/2 ML INTRAVENOUS SOLUTION
20.0000 mg | Freq: Once | INTRAVENOUS | Status: DC
Start: 2018-08-11 — End: 2018-08-11

## 2018-08-11 MED ORDER — SODIUM CHLORIDE 0.9 % INTRAVENOUS SOLUTION
20.0000 mg | Freq: Once | INTRAVENOUS | Status: DC
Start: 2018-08-11 — End: 2018-08-11
  Filled 2018-08-11: qty 5

## 2018-08-11 MED ORDER — DIPHENHYDRAMINE 50 MG/ML INJECTION SOLUTION
50.0000 mg | Freq: Once | INTRAMUSCULAR | Status: DC
Start: 2018-08-11 — End: 2018-08-11

## 2018-08-11 MED ORDER — SODIUM CHLORIDE 0.9 % INTRAVENOUS SOLUTION
20.0000 mg | Freq: Once | INTRAVENOUS | Status: AC
Start: 2018-08-11 — End: 2018-08-11
  Administered 2018-08-11: 20 mg via INTRAVENOUS
  Administered 2018-08-11: 14:00:00 0 mg via INTRAVENOUS
  Filled 2018-08-11: qty 2

## 2018-08-11 MED ORDER — SODIUM CHLORIDE 0.9 % INTRAVENOUS SOLUTION
45.0000 mg/m2 | Freq: Once | INTRAVENOUS | Status: AC
Start: 2018-08-11 — End: 2018-08-11
  Administered 2018-08-11: 108 mg via INTRAVENOUS
  Administered 2018-08-11: 0 mg via INTRAVENOUS
  Filled 2018-08-11: qty 18

## 2018-08-11 MED ORDER — PALONOSETRON 0.25 MG/5 ML INTRAVENOUS SOLUTION
0.2500 mg | Freq: Once | INTRAVENOUS | Status: AC
Start: 2018-08-11 — End: 2018-08-11
  Administered 2018-08-11: 0.25 mg via INTRAVENOUS
  Filled 2018-08-11: qty 5

## 2018-08-11 MED ADMIN — levETIRAcetam 500 mg tablet: INTRAVENOUS | @ 14:00:00

## 2018-08-11 NOTE — Nurses Notes (Addendum)
1402: IV infusion of Pepcid/Benadryl complete. Rt chest port flushed with 29mL NS. Pt tolerated medication well.    1403: IV infusion of Decadron started at a rate of 20 minutes. Pt resting in infusion chair at this time. Pt given a warm blanker. Pt denies any further questions/concerns at this time.  1415: infusion complete, port flushed with 10 ml ns -BR, Taxol started to infuse over a 1 hour rate. No complaints at this time. [name], RN

## 2018-08-11 NOTE — Nurses Notes (Signed)
1306: Rt chest port flushed with 35mL NS. No blood return present when flushing port. Aloxi given IVP over 30 seconds, flushed after with 55mL NS. Pepcid/Benadryl started at a 30 min rate. Farrel Demark, RN

## 2018-08-12 ENCOUNTER — Inpatient Hospital Stay (HOSPITAL_COMMUNITY)
Admission: RE | Admit: 2018-08-12 | Discharge: 2018-08-12 | Disposition: A | Discharge status: Still In Hospital | Payer: Medicare Other | Source: Ambulatory Visit

## 2018-08-12 ENCOUNTER — Ambulatory Visit (HOSPITAL_COMMUNITY): Payer: Medicare Other | Admitting: Radiation Oncology

## 2018-08-12 DIAGNOSIS — G4730 Sleep apnea, unspecified: Secondary | ICD-10-CM | POA: Diagnosis not present

## 2018-08-12 DIAGNOSIS — Z7982 Long term (current) use of aspirin: Secondary | ICD-10-CM | POA: Diagnosis not present

## 2018-08-12 DIAGNOSIS — I4891 Unspecified atrial fibrillation: Secondary | ICD-10-CM | POA: Diagnosis not present

## 2018-08-12 DIAGNOSIS — M1990 Unspecified osteoarthritis, unspecified site: Secondary | ICD-10-CM | POA: Diagnosis not present

## 2018-08-12 DIAGNOSIS — Z85038 Personal history of other malignant neoplasm of large intestine: Secondary | ICD-10-CM | POA: Diagnosis not present

## 2018-08-12 DIAGNOSIS — Z8541 Personal history of malignant neoplasm of cervix uteri: Secondary | ICD-10-CM | POA: Diagnosis not present

## 2018-08-12 DIAGNOSIS — I10 Essential (primary) hypertension: Secondary | ICD-10-CM | POA: Diagnosis not present

## 2018-08-12 DIAGNOSIS — J449 Chronic obstructive pulmonary disease, unspecified: Secondary | ICD-10-CM | POA: Diagnosis not present

## 2018-08-12 DIAGNOSIS — Z87891 Personal history of nicotine dependence: Secondary | ICD-10-CM | POA: Diagnosis not present

## 2018-08-12 DIAGNOSIS — Z7951 Long term (current) use of inhaled steroids: Secondary | ICD-10-CM | POA: Diagnosis not present

## 2018-08-12 DIAGNOSIS — T451X5A Adverse effect of antineoplastic and immunosuppressive drugs, initial encounter: Secondary | ICD-10-CM | POA: Diagnosis not present

## 2018-08-12 DIAGNOSIS — Z5111 Encounter for antineoplastic chemotherapy: Secondary | ICD-10-CM | POA: Diagnosis not present

## 2018-08-12 DIAGNOSIS — Z510 Encounter for antineoplastic radiation therapy: Secondary | ICD-10-CM | POA: Diagnosis not present

## 2018-08-12 DIAGNOSIS — Z79899 Other long term (current) drug therapy: Secondary | ICD-10-CM | POA: Diagnosis not present

## 2018-08-12 DIAGNOSIS — J45909 Unspecified asthma, uncomplicated: Secondary | ICD-10-CM | POA: Diagnosis not present

## 2018-08-12 DIAGNOSIS — Z7901 Long term (current) use of anticoagulants: Secondary | ICD-10-CM | POA: Diagnosis not present

## 2018-08-12 DIAGNOSIS — D6959 Other secondary thrombocytopenia: Secondary | ICD-10-CM | POA: Diagnosis not present

## 2018-08-12 DIAGNOSIS — E785 Hyperlipidemia, unspecified: Secondary | ICD-10-CM | POA: Diagnosis not present

## 2018-08-12 DIAGNOSIS — C3431 Malignant neoplasm of lower lobe, right bronchus or lung: Secondary | ICD-10-CM | POA: Diagnosis not present

## 2018-08-12 DIAGNOSIS — R112 Nausea with vomiting, unspecified: Secondary | ICD-10-CM | POA: Diagnosis not present

## 2018-08-12 LAB — ADULT ROUTINE BLOOD CULTURE, SET OF 2 BOTTLES (BACTERIA AND YEAST)
BLOOD CULTURE, ROUTINE: NO GROWTH
BLOOD CULTURE, ROUTINE: NO GROWTH

## 2018-08-14 ENCOUNTER — Ambulatory Visit (HOSPITAL_COMMUNITY): Payer: Medicare Other | Admitting: Radiation Oncology

## 2018-08-14 ENCOUNTER — Inpatient Hospital Stay (HOSPITAL_COMMUNITY)
Admission: RE | Admit: 2018-08-14 | Discharge: 2018-08-14 | Disposition: A | Discharge status: Still In Hospital | Payer: Medicare Other | Source: Ambulatory Visit

## 2018-08-14 DIAGNOSIS — I4891 Unspecified atrial fibrillation: Secondary | ICD-10-CM | POA: Diagnosis not present

## 2018-08-14 DIAGNOSIS — Z7901 Long term (current) use of anticoagulants: Secondary | ICD-10-CM | POA: Diagnosis not present

## 2018-08-14 DIAGNOSIS — C3431 Malignant neoplasm of lower lobe, right bronchus or lung: Secondary | ICD-10-CM | POA: Diagnosis not present

## 2018-08-14 DIAGNOSIS — G4730 Sleep apnea, unspecified: Secondary | ICD-10-CM | POA: Diagnosis not present

## 2018-08-14 DIAGNOSIS — Z7982 Long term (current) use of aspirin: Secondary | ICD-10-CM | POA: Diagnosis not present

## 2018-08-14 DIAGNOSIS — Z7951 Long term (current) use of inhaled steroids: Secondary | ICD-10-CM | POA: Diagnosis not present

## 2018-08-14 DIAGNOSIS — R112 Nausea with vomiting, unspecified: Secondary | ICD-10-CM | POA: Diagnosis not present

## 2018-08-14 DIAGNOSIS — T451X5A Adverse effect of antineoplastic and immunosuppressive drugs, initial encounter: Secondary | ICD-10-CM | POA: Diagnosis not present

## 2018-08-14 DIAGNOSIS — I10 Essential (primary) hypertension: Secondary | ICD-10-CM | POA: Diagnosis not present

## 2018-08-14 DIAGNOSIS — Z85038 Personal history of other malignant neoplasm of large intestine: Secondary | ICD-10-CM | POA: Diagnosis not present

## 2018-08-14 DIAGNOSIS — D6959 Other secondary thrombocytopenia: Secondary | ICD-10-CM | POA: Diagnosis not present

## 2018-08-14 DIAGNOSIS — M1990 Unspecified osteoarthritis, unspecified site: Secondary | ICD-10-CM | POA: Diagnosis not present

## 2018-08-14 DIAGNOSIS — Z79899 Other long term (current) drug therapy: Secondary | ICD-10-CM | POA: Diagnosis not present

## 2018-08-14 DIAGNOSIS — E785 Hyperlipidemia, unspecified: Secondary | ICD-10-CM | POA: Diagnosis not present

## 2018-08-14 DIAGNOSIS — J449 Chronic obstructive pulmonary disease, unspecified: Secondary | ICD-10-CM | POA: Diagnosis not present

## 2018-08-14 DIAGNOSIS — Z87891 Personal history of nicotine dependence: Secondary | ICD-10-CM | POA: Diagnosis not present

## 2018-08-14 DIAGNOSIS — Z5111 Encounter for antineoplastic chemotherapy: Secondary | ICD-10-CM | POA: Diagnosis not present

## 2018-08-14 DIAGNOSIS — Z8541 Personal history of malignant neoplasm of cervix uteri: Secondary | ICD-10-CM | POA: Diagnosis not present

## 2018-08-14 DIAGNOSIS — J45909 Unspecified asthma, uncomplicated: Secondary | ICD-10-CM | POA: Diagnosis not present

## 2018-08-14 DIAGNOSIS — Z510 Encounter for antineoplastic radiation therapy: Secondary | ICD-10-CM | POA: Diagnosis not present

## 2018-08-15 ENCOUNTER — Ambulatory Visit (HOSPITAL_COMMUNITY)
Admission: RE | Admit: 2018-08-15 | Discharge: 2018-08-15 | Disposition: A | Discharge status: Home / Self Care | Payer: Medicare Other | Source: Ambulatory Visit

## 2018-08-15 ENCOUNTER — Other Ambulatory Visit (HOSPITAL_COMMUNITY): Payer: Self-pay | Admitting: Nurse Practitioner

## 2018-08-15 ENCOUNTER — Ambulatory Visit (HOSPITAL_COMMUNITY): Payer: Medicare Other | Admitting: Radiation Oncology

## 2018-08-15 ENCOUNTER — Inpatient Hospital Stay (HOSPITAL_COMMUNITY)
Admission: RE | Admit: 2018-08-15 | Discharge: 2018-08-15 | Disposition: A | Discharge status: Still In Hospital | Payer: Medicare Other | Source: Ambulatory Visit | Admitting: Radiation Oncology

## 2018-08-15 ENCOUNTER — Inpatient Hospital Stay (HOSPITAL_COMMUNITY)
Admission: RE | Admit: 2018-08-15 | Discharge: 2018-08-15 | Disposition: A | Discharge status: Still In Hospital | Payer: Medicare Other | Source: Ambulatory Visit

## 2018-08-15 DIAGNOSIS — Z7901 Long term (current) use of anticoagulants: Secondary | ICD-10-CM | POA: Diagnosis not present

## 2018-08-15 DIAGNOSIS — Z87891 Personal history of nicotine dependence: Secondary | ICD-10-CM | POA: Diagnosis not present

## 2018-08-15 DIAGNOSIS — Z510 Encounter for antineoplastic radiation therapy: Secondary | ICD-10-CM | POA: Diagnosis not present

## 2018-08-15 DIAGNOSIS — C3431 Malignant neoplasm of lower lobe, right bronchus or lung: Secondary | ICD-10-CM | POA: Diagnosis not present

## 2018-08-15 DIAGNOSIS — Z79899 Other long term (current) drug therapy: Secondary | ICD-10-CM | POA: Diagnosis not present

## 2018-08-15 DIAGNOSIS — D6959 Other secondary thrombocytopenia: Secondary | ICD-10-CM | POA: Diagnosis not present

## 2018-08-15 DIAGNOSIS — Z8541 Personal history of malignant neoplasm of cervix uteri: Secondary | ICD-10-CM | POA: Diagnosis not present

## 2018-08-15 DIAGNOSIS — G4730 Sleep apnea, unspecified: Secondary | ICD-10-CM | POA: Diagnosis not present

## 2018-08-15 DIAGNOSIS — Z5111 Encounter for antineoplastic chemotherapy: Secondary | ICD-10-CM | POA: Diagnosis not present

## 2018-08-15 DIAGNOSIS — I10 Essential (primary) hypertension: Secondary | ICD-10-CM | POA: Diagnosis not present

## 2018-08-15 DIAGNOSIS — M1990 Unspecified osteoarthritis, unspecified site: Secondary | ICD-10-CM | POA: Diagnosis not present

## 2018-08-15 DIAGNOSIS — R112 Nausea with vomiting, unspecified: Secondary | ICD-10-CM | POA: Diagnosis not present

## 2018-08-15 DIAGNOSIS — T451X5A Adverse effect of antineoplastic and immunosuppressive drugs, initial encounter: Secondary | ICD-10-CM | POA: Diagnosis not present

## 2018-08-15 DIAGNOSIS — Z7982 Long term (current) use of aspirin: Secondary | ICD-10-CM | POA: Diagnosis not present

## 2018-08-15 DIAGNOSIS — E785 Hyperlipidemia, unspecified: Secondary | ICD-10-CM | POA: Diagnosis not present

## 2018-08-15 DIAGNOSIS — J449 Chronic obstructive pulmonary disease, unspecified: Secondary | ICD-10-CM | POA: Diagnosis not present

## 2018-08-15 DIAGNOSIS — I4891 Unspecified atrial fibrillation: Secondary | ICD-10-CM | POA: Diagnosis not present

## 2018-08-15 DIAGNOSIS — J45909 Unspecified asthma, uncomplicated: Secondary | ICD-10-CM | POA: Diagnosis not present

## 2018-08-15 DIAGNOSIS — Z85038 Personal history of other malignant neoplasm of large intestine: Secondary | ICD-10-CM | POA: Diagnosis not present

## 2018-08-15 DIAGNOSIS — Z7951 Long term (current) use of inhaled steroids: Secondary | ICD-10-CM | POA: Diagnosis not present

## 2018-08-15 DIAGNOSIS — D709 Neutropenia, unspecified: Principal | ICD-10-CM

## 2018-08-15 DIAGNOSIS — C3490 Malignant neoplasm of unspecified part of unspecified bronchus or lung: Secondary | ICD-10-CM

## 2018-08-15 DIAGNOSIS — R5081 Fever presenting with conditions classified elsewhere: Secondary | ICD-10-CM | POA: Insufficient documentation

## 2018-08-15 LAB — COMPREHENSIVE METABOLIC PANEL, NON-FASTING
ALBUMIN: 3.5 g/dL (ref 3.2–4.6)
ALKALINE PHOSPHATASE: 70 U/L (ref 20–130)
ALT (SGPT): 43 U/L (ref <=52)
ANION GAP: 4 mmol/L
AST (SGOT): 35 U/L (ref <=35)
BILIRUBIN TOTAL: 1 mg/dL (ref 0.3–1.2)
BUN/CREA RATIO: 19
BUN: 14 mg/dL (ref 10–25)
BUN: 14 mg/dL (ref 10–25)
CALCIUM: 7.9 mg/dL — ABNORMAL LOW (ref 8.8–10.3)
CHLORIDE: 105 mmol/L (ref 98–111)
CO2 TOTAL: 30 mmol/L (ref 21–35)
CREATININE: 0.72 mg/dL (ref <=1.30)
ESTIMATED GFR: >60 mL/min/{1.73_m2}
GLUCOSE: 123 mg/dL — ABNORMAL HIGH (ref 70–110)
POTASSIUM: 3.6 mmol/L (ref 3.5–5.0)
PROTEIN TOTAL: 5.5 g/dL — ABNORMAL LOW (ref 6.0–8.3)
PROTEIN TOTAL: 5.5 g/dL — ABNORMAL LOW (ref 6.0–8.3)
SODIUM: 139 mmol/L (ref 135–145)

## 2018-08-15 LAB — CBC WITH DIFF
BASOPHIL #: 0 10*3/uL (ref 0.00–0.20)
BASOPHIL %: 1 %
EOSINOPHIL #: 0 10*3/uL (ref 0.00–0.50)
EOSINOPHIL %: 1 %
HCT: 35.8 % (ref 34.6–46.2)
HGB: 11.7 g/dL — ABNORMAL LOW (ref 11.8–15.8)
LYMPHOCYTE #: 0.4 10*3/uL — ABNORMAL LOW (ref 0.90–3.40)
LYMPHOCYTE %: 21 %
MCH: 26.1 pg — ABNORMAL LOW (ref 27.6–33.2)
MCHC: 32.7 g/dL (ref 32.6–35.4)
MCV: 79.8 fL — ABNORMAL LOW (ref 82.3–96.7)
MONOCYTE #: 0.1 10*3/uL — ABNORMAL LOW (ref 0.20–0.90)
MONOCYTE %: 6 %
MPV: 8 fL (ref 6.6–10.2)
NEUTROPHIL #: 1.4 10*3/uL — ABNORMAL LOW (ref 1.50–6.40)
NEUTROPHIL %: 71 %
PLATELETS: 110 10*3/uL — ABNORMAL LOW (ref 140–440)
RBC: 4.49 10*6/uL (ref 3.80–5.24)
RDW: 16.5 % — ABNORMAL HIGH (ref 12.4–15.2)
WBC: 1.9 10*3/uL — ABNORMAL LOW (ref 3.5–10.3)

## 2018-08-15 LAB — URINALYSIS, MACRO/MICRO
BLOOD: NEGATIVE mg/dL
GLUCOSE: NEGATIVE mg/dL
KETONES: NEGATIVE mg/dL
LEUKOCYTES: NEGATIVE WBCs/uL
NITRITE: NEGATIVE
PH: 6 (ref 5.0–7.0)
PROTEIN: NEGATIVE mg/dL
SPECIFIC GRAVITY: 1.026 — ABNORMAL HIGH (ref 1.010–1.025)
UROBILINOGEN: 1 mg/dL

## 2018-08-15 MED ORDER — AMOXICILLIN 875 MG-POTASSIUM CLAVULANATE 125 MG TABLET
1.00 | ORAL_TABLET | Freq: Two times a day (BID) | ORAL | 0 refills | Status: AC
Start: 2018-08-15 — End: 2018-08-22

## 2018-08-15 NOTE — Progress Notes (Signed)
2700 rad  No worse  No major c/o  tol fair  cpm

## 2018-08-15 NOTE — Nurses Notes (Signed)
291 lbs. Pt in room 2 for [facility] Lung. Pt denies any complaints or changes to medication/allergies. Jimmye Norman, RN

## 2018-08-18 ENCOUNTER — Encounter (HOSPITAL_COMMUNITY): Payer: Self-pay | Admitting: Internal Medicine

## 2018-08-18 ENCOUNTER — Inpatient Hospital Stay (HOSPITAL_COMMUNITY)
Admission: RE | Admit: 2018-08-18 | Discharge: 2018-08-18 | Disposition: A | Discharge status: Still In Hospital | Payer: Medicare Other | Source: Ambulatory Visit

## 2018-08-18 ENCOUNTER — Ambulatory Visit (HOSPITAL_COMMUNITY)
Admission: RE | Admit: 2018-08-18 | Discharge: 2018-08-18 | Disposition: A | Discharge status: Home / Self Care | Payer: Medicare Other | Source: Ambulatory Visit

## 2018-08-18 ENCOUNTER — Ambulatory Visit (HOSPITAL_BASED_OUTPATIENT_CLINIC_OR_DEPARTMENT_OTHER): Payer: Medicare Other | Admitting: Internal Medicine

## 2018-08-18 VITALS — BP 116/67 | HR 73 | Temp 96.9°F | Ht 62.0 in | Wt 290.0 lb

## 2018-08-18 DIAGNOSIS — C3490 Malignant neoplasm of unspecified part of unspecified bronchus or lung: Secondary | ICD-10-CM | POA: Diagnosis not present

## 2018-08-18 DIAGNOSIS — E785 Hyperlipidemia, unspecified: Secondary | ICD-10-CM | POA: Diagnosis not present

## 2018-08-18 DIAGNOSIS — Z85038 Personal history of other malignant neoplasm of large intestine: Secondary | ICD-10-CM | POA: Diagnosis not present

## 2018-08-18 DIAGNOSIS — J45909 Unspecified asthma, uncomplicated: Secondary | ICD-10-CM | POA: Diagnosis not present

## 2018-08-18 DIAGNOSIS — C3431 Malignant neoplasm of lower lobe, right bronchus or lung: Secondary | ICD-10-CM | POA: Diagnosis not present

## 2018-08-18 DIAGNOSIS — Z5111 Encounter for antineoplastic chemotherapy: Secondary | ICD-10-CM | POA: Diagnosis not present

## 2018-08-18 DIAGNOSIS — Z510 Encounter for antineoplastic radiation therapy: Secondary | ICD-10-CM | POA: Diagnosis not present

## 2018-08-18 DIAGNOSIS — Z7901 Long term (current) use of anticoagulants: Secondary | ICD-10-CM | POA: Diagnosis not present

## 2018-08-18 DIAGNOSIS — Z87891 Personal history of nicotine dependence: Secondary | ICD-10-CM | POA: Diagnosis not present

## 2018-08-18 DIAGNOSIS — D6959 Other secondary thrombocytopenia: Secondary | ICD-10-CM | POA: Diagnosis not present

## 2018-08-18 DIAGNOSIS — R112 Nausea with vomiting, unspecified: Secondary | ICD-10-CM | POA: Diagnosis not present

## 2018-08-18 DIAGNOSIS — Z79899 Other long term (current) drug therapy: Secondary | ICD-10-CM | POA: Diagnosis not present

## 2018-08-18 DIAGNOSIS — Z7982 Long term (current) use of aspirin: Secondary | ICD-10-CM | POA: Diagnosis not present

## 2018-08-18 DIAGNOSIS — Z7951 Long term (current) use of inhaled steroids: Secondary | ICD-10-CM | POA: Diagnosis not present

## 2018-08-18 DIAGNOSIS — M1990 Unspecified osteoarthritis, unspecified site: Secondary | ICD-10-CM | POA: Diagnosis not present

## 2018-08-18 DIAGNOSIS — I10 Essential (primary) hypertension: Secondary | ICD-10-CM | POA: Diagnosis not present

## 2018-08-18 DIAGNOSIS — I4891 Unspecified atrial fibrillation: Secondary | ICD-10-CM | POA: Diagnosis not present

## 2018-08-18 DIAGNOSIS — G4730 Sleep apnea, unspecified: Secondary | ICD-10-CM | POA: Diagnosis not present

## 2018-08-18 DIAGNOSIS — J449 Chronic obstructive pulmonary disease, unspecified: Secondary | ICD-10-CM | POA: Diagnosis not present

## 2018-08-18 DIAGNOSIS — Z8541 Personal history of malignant neoplasm of cervix uteri: Secondary | ICD-10-CM | POA: Diagnosis not present

## 2018-08-18 DIAGNOSIS — T451X5A Adverse effect of antineoplastic and immunosuppressive drugs, initial encounter: Secondary | ICD-10-CM | POA: Diagnosis not present

## 2018-08-18 LAB — CBC WITH DIFF
BASOPHIL #: 0 10*3/uL (ref 0.00–0.20)
BASOPHIL %: 1 %
EOSINOPHIL #: 0 10*3/uL (ref 0.00–0.50)
EOSINOPHIL %: 1 %
HCT: 34.7 % (ref 34.6–46.2)
HGB: 11.4 g/dL — ABNORMAL LOW (ref 11.8–15.8)
LYMPHOCYTE #: 0.4 10*3/uL — ABNORMAL LOW (ref 0.90–3.40)
LYMPHOCYTE %: 16 %
MCH: 26.3 pg — ABNORMAL LOW (ref 27.6–33.2)
MCHC: 32.9 g/dL (ref 32.6–35.4)
MCV: 79.9 fL — ABNORMAL LOW (ref 82.3–96.7)
MONOCYTE #: 0.1 10*3/uL — ABNORMAL LOW (ref 0.20–0.90)
MONOCYTE %: 5 %
MONOCYTE %: 5 %
MPV: 8.6 fL (ref 6.6–10.2)
NEUTROPHIL #: 1.9 10*3/uL (ref 1.50–6.40)
NEUTROPHIL #: 1.9 x10?3/uL (ref 1.50–6.40)
NEUTROPHIL %: 77 %
PLATELETS: 102 10*3/uL — ABNORMAL LOW (ref 140–440)
RBC: 4.35 10*6/uL (ref 3.80–5.24)
RBC: 4.35 x10?6/uL (ref 3.80–5.24)
RDW: 15.9 % — ABNORMAL HIGH (ref 12.4–15.2)
WBC: 2.5 10*3/uL — ABNORMAL LOW (ref 3.5–10.3)

## 2018-08-18 LAB — COMPREHENSIVE METABOLIC PANEL, NON-FASTING
ALBUMIN: 3.5 g/dL (ref 3.2–4.6)
ALKALINE PHOSPHATASE: 65 U/L (ref 20–130)
ALT (SGPT): 35 U/L (ref <=52)
ANION GAP: 6 mmol/L
AST (SGOT): 28 U/L (ref <=35)
BILIRUBIN TOTAL: 0.7 mg/dL (ref 0.3–1.2)
BUN/CREA RATIO: 12
BUN: 10 mg/dL (ref 10–25)
CALCIUM: 8.3 mg/dL — ABNORMAL LOW (ref 8.8–10.3)
CHLORIDE: 106 mmol/L (ref 98–111)
CO2 TOTAL: 26 mmol/L (ref 21–35)
CREATININE: 0.82 mg/dL (ref <=1.30)
ESTIMATED GFR: >60 mL/min/{1.73_m2}
GLUCOSE: 163 mg/dL — ABNORMAL HIGH (ref 70–110)
POTASSIUM: 3.8 mmol/L (ref 3.5–5.0)
PROTEIN TOTAL: 5.7 g/dL — ABNORMAL LOW (ref 6.0–8.3)
SODIUM: 138 mmol/L (ref 135–145)

## 2018-08-18 MED ORDER — SODIUM CHLORIDE 0.9 % INTRAVENOUS SOLUTION
20.0000 mg | Freq: Once | INTRAVENOUS | Status: AC
Start: 2018-08-18 — End: 2018-08-18
  Administered 2018-08-18: 20 mg via INTRAVENOUS
  Administered 2018-08-18: 0 mg via INTRAVENOUS
  Filled 2018-08-18: qty 5

## 2018-08-18 MED ORDER — DIPHENHYDRAMINE 50 MG/ML INJECTION SOLUTION
50.0000 mg | Freq: Once | INTRAMUSCULAR | Status: DC
Start: 2018-08-18 — End: 2018-08-18

## 2018-08-18 MED ORDER — SODIUM CHLORIDE 0.9 % INTRAVENOUS SOLUTION
45.0000 mg/m2 | Freq: Once | INTRAVENOUS | Status: AC
Start: 2018-08-18 — End: 2018-08-18
  Administered 2018-08-18: 0 mg via INTRAVENOUS
  Administered 2018-08-18: 108 mg via INTRAVENOUS
  Filled 2018-08-18: qty 18

## 2018-08-18 MED ORDER — SODIUM CHLORIDE 0.9 % INTRAVENOUS SOLUTION
222.2000 mg | Freq: Once | INTRAVENOUS | Status: AC
Start: 2018-08-18 — End: 2018-08-18
  Administered 2018-08-18: 0 mg via INTRAVENOUS
  Administered 2018-08-18: 220 mg via INTRAVENOUS
  Filled 2018-08-18: qty 22

## 2018-08-18 MED ORDER — SODIUM CHLORIDE 0.9 % INTRAVENOUS SOLUTION
Freq: Once | INTRAVENOUS | Status: AC
Start: 2018-08-18 — End: 2018-08-18
  Filled 2018-08-18: qty 2

## 2018-08-18 MED ORDER — FAMOTIDINE (PF) 20 MG/2 ML INTRAVENOUS SOLUTION
20.0000 mg | Freq: Once | INTRAVENOUS | Status: DC
Start: 2018-08-18 — End: 2018-08-18

## 2018-08-18 MED ORDER — PALONOSETRON 0.25 MG/5 ML INTRAVENOUS SOLUTION
0.2500 mg | Freq: Once | INTRAVENOUS | Status: AC
Start: 2018-08-18 — End: 2018-08-18
  Administered 2018-08-18: 0.25 mg via INTRAVENOUS
  Filled 2018-08-18: qty 5

## 2018-08-18 MED ADMIN — Medication: INTRAVENOUS | @ 12:00:00

## 2018-08-18 MED ADMIN — lactated Ringers intravenous solution: INTRAVENOUS | @ 11:00:00 | NDC 00338011704

## 2018-08-18 MED ADMIN — lactated Ringers intravenous solution: INTRAVENOUS | @ 12:00:00 | NDC 00338011704

## 2018-08-18 NOTE — Progress Notes (Signed)
[place]  [place] [id number]        Encounter Date: 08/18/2018   9:15 AM EST    Name:  Kelsey [name]  Age: 67 y.o.  DOB: [birth year]  Sex: female  PCP: Benjie Karvonen    Chief Complaint:  No chief complaint on file.        HISTORY OF PRESENT ILLNESS:   67 y.o. female with history of stage III colon cancer of present evaluation and management of lung cancer.    1. September 2015. Patient underwent sigmoidoscopy and was found to have polyp which was adenocarcinoma.    2. June 10, 2014. She was admitted for segmental resection of sigmoid colon. This revealed pT3, N1a, MX, low-grade  adenocarcinoma of the colon. The mass measuring 4 x 3 x 0.5 centimeters. It  was low-grade and there was no evidence of microsatellite instability by  histology. Margins were uninvolved and 4 lymph nodes only were removed which  only 1 was involved. This corresponding stage IIIB, T3, N1a, M0, low-grade  sigmoid cancer.   3. July 07, 2014. Recommended adjuvant chemotherapy with FOLFOX.    4. July 27, 2014. Patient was started on adjuvant chemotherapy with FOLFOX.  Patient received only 5 treatments and chemotherapy was stopped due to poor tolerance.  After that patient lost follow-up.      5. October 2019. Patient was admitted to hospital with concern for  bowel obstruction.  CT chest showed lung nodules.  Patient was managed conservatively and discharged home.    6. May 06, 2018.  Patient underwent CT-guided biopsy of lung lesion.  Pathology showed well-differentiated adenocarcinoma.  Section shows well-differentiated adenocarcinoma which positive for CK 7 and TTF 1, negative for P 63 and CK 20.     7. June 23, 2018. PET scan showed Hypermetabolic right lower lobe pulmonary mass compatible with malignancy.  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 Hepatomegaly and steatosis.     8. July 23, 2018. Patient was started on chemoradiation.    SUBJECTIVE:  Patient today presents for follow-up in treatment.  Denies any new issues since last visit.  Tolerated treatment well.  Denies any further episode of fever.  Overall feeling much better.    REVIEW OF SYSTEMS:  GENERAL:  no fevers, chills  HEENT: no sore throat, congestion, blurry vision  Lungs: no shortness of breath, cough, wheezes at this time  GI: no nausea, vomiting, diarrhea, constipation  GU: No dysuria, urgency, increased frequency   Cardiac: no chest pains, palpitations, syncope,   Neuro: no weakness, loss of function, numbness, tingling  Skin: no rash  Musculoskeletal: No myalgias  Psychosocial: No depression, anxiety  Hematologic: No easy bruising, abnormal bleeding  All other review ROS negative except those mentioned above.     PATIENT HISTORY:  Past Medical History:   Diagnosis Date   . A-fib (CMS HCC)    . Arthropathy, unspecified, site unspecified    . Asthma    . Cancer (CMS HCC)     cervical   . Cataract    . Colon cancer (CMS [place]) 07/06/2014   . COPD (chronic obstructive pulmonary disease) (CMS HCC)    . Fistula    .  HTN (hypertension)    . Hyperlipidemia    . Obesity    . Sleep apnea    . Ventral hernia          Past Surgical History:   Procedure Laterality Date   . ABCESS DRAINAGE     . BOWEL RESECTION     . CATARACT EXTRACTION     . HX CERVICAL CONE BIOPSY      . HX CESAREAN SECTION     . HX GASTRIC BYPASS      25 years ago   . [place] (x2)   . HX HYSTERECTOMY      late 1990s   . HX LAP BANDING      2013   . HX LAPAROTOMY      2013 to remove lap band         Family Medical History:     Problem Relation (Age of Onset)    Diabetes Mother, Maternal Grandmother, Maternal Grandfather, Father    Heart Attack Mother    Stroke Father            Current Outpatient Medications   Medication Sig   . acetaminophen (TYLENOL) 325 mg Oral Tablet Take 2 Tabs (650 mg total) by mouth Every 6 hours as needed   . albuterol  sulfate (PROVENTIL) 2.5 mg /3 mL (0.083 %) Inhalation Solution for Nebulization 3 mL (2.5 mg total) by Nebulization route Every 6 hours   . amoxicillin-pot clavulanate (AUGMENTIN) 875-125 mg Oral Tablet Take 1 Tab by mouth Twice daily for 7 days   . apixaban (ELIQUIS) 5 mg Oral Tablet Take 1 Tab (5 mg total) by mouth Twice daily   . atorvastatin (LIPITOR) 10 mg Oral Tablet Take 10 mg by mouth Once a day   . budesonide-formoterol (SYMBICORT) 160-4.5 mcg/actuation Inhalation HFA Aerosol Inhaler Take 2 Puffs by inhalation Twice daily   . CARVEDILOL ORAL Take 6.25 mg by mouth Every evening    . cephalexin (KEFLEX) 500 mg Oral Capsule Take 1 Cap (500 mg total) by mouth Three times a day   . docusate sodium (COLACE) 100 mg Oral Capsule Take 100 mg by mouth Three times a day as needed    . DULoxetine (CYMBALTA DR) 30 mg Oral Capsule, Delayed Release(E.C.) Take 30 mg by mouth Once a day   . flecainide (TAMBOCOR) 50 mg Oral Tablet Take 1 Tab (50 mg total) by mouth Twice daily   . linaCLOtide (LINZESS) 72 mcg Oral Capsule Take 72 mcg by mouth Every morning   . loperamide (IMODIUM) 2 mg Oral Capsule Take 2 mg by mouth Every 4 hours as needed   . magnesium oxide (MAG-OX) 400 mg Oral Tablet Take 400 mg by mouth Once a day   . omeprazole (PRILOSEC) 20 mg Oral Capsule, Delayed Release(E.C.) Take 40 mg by mouth Once a day    . potassium chloride (K-DUR) 20 mEq Oral Tab Sust.Rel. Particle/Crystal Take 20 mEq by mouth Once a day   . prochlorperazine (COMPAZINE) 10 mg Oral Tablet Take 1 Tab (10 mg total) by mouth Four times a day as needed for nausea/vomiting     Social History     Socioeconomic History   . Marital status: Married     Spouse name: Not on file   . Number of children: Not on file   . Years of education: Not on file   . Highest education level: Not on file   Tobacco  Use   . Smoking status: Former Smoker   . Smokeless tobacco: Never Used   . Tobacco comment: quit 10 yrs ago   Substance and Sexual Activity   . Alcohol use:  Not Currently     Comment: rarely   . Drug use: No   Other Topics Concern   . Ability to Walk 1 Flight of Steps without SOB/CP No   . Ability To Do Own ADL's Yes       PHYSICAL EXAMINATION:  VITALS: BP 116/67   Pulse 73   Temp 36.1 C (96.9 F)   Ht 1.575 m (5' 2" )   Wt 131.5 kg (290 lb)   SpO2 96%   BMI 53.04 kg/m         PHYSICAL EXAM:   Consitutional: No acute distress.    Eyes: EOMI. No discharge. No Jaundice.   ENT: mucous membranes dry. No posterior pharynx lesions.. Neck supple, No palpable masses   Heme/ Lymph: No Cervical, Inguinal, axillary lymh nodes. No Bruising.   Cardiovascular: S1, S2, No murmurs, rubs, or gallops.   Respiratory: Clear to auscultate and percuss B/L, No rhonchi, crackles, or wheezing.   Abdomen: Normal Bowel Sounds, nontender nondistended. No hepatosplenomegaly.   Musculoskeletal: No Edema to the extremities.   Skin: Normal turgor. No Rashes,skin lesions   Psychiatry: Normal Affect   Neuro: No focal deficits. Alert and Oriented x 3      ENCOUNTER ORDERS:  No orders of the defined types were placed in this encounter.      LABORATORY/RADIOLOGICAL DATA: All pertinent labs/radiology were reviewed.     ASSESSMENT AND PLAN:  Encounter Diagnoses   Name Primary?   . Malignant neoplasm of lung, unspecified laterality, unspecified part of lung (CMS HCC) Yes   . Encounter for antineoplastic chemotherapy    . Chemotherapy induced nausea and vomiting    . Chemotherapy-induced thrombocytopenia    67 y.o. female with history of stage III colon cancer status post 5 cycle of FOLFOX now presenting with stage III lung cancer.  I explained the reason for referred to Hematology/Oncology Clinic.      Adenocarcinoma lung:  Stage III.   - CT brain to rule out metastatic disease.  NEGATIVE  - Port in place.  - Started on chemoradiation carboplatin/Taxol along with radiation- July 23, 2018.  After completion of chemo radiation patient will get PET scan.  If continued to have good response will start  maintenance immunotherapy for 1 year.          CHEMOTHERAPY:/RADIATION  CYCLE DAY   DRUG DOSE TOTAL DOSE % ROUTE SCHEDULE   Carboplatin AUC- 2 205 mg    Weekly    Paclitaxel 45 mg/m2 108 mg    Weekly                      Shared decision.  I discussed diagnosis, prognosis and treatment option with patient and family.  I had detailed discussion with patient regarding chemotherapy and radiation.  I also discussed brain scan to rule out metastatic disease before starting treatment.  I counseled patient regarding chemotherapy side effects and their management.  Patient understand and want to proceed with treatment.      Neutropenia:  Due to chemotherapy.  - resolved.    CINV  - Zofran PRN    Transaminitis:  -resolved.    Thrombocytopenia:  Chemo induced.  - platelets today 102 K  - will continue to monitor.    Return  in about 1 week (around 08/25/2018) for cbc/diff, CMP, Treatment.      Vanetta Mulders, MD  Hematology/Oncology

## 2018-08-18 NOTE — Nurses Notes (Addendum)
1103: Right chest port flushed with 19ml NS; -br noted. Aloxi given over slow IV push followed by 41ml NS; -br noted. Pepcid/Benadryl started to infuse at 15 minute rate. Rayah Fines, RN  1124: Infusion complete. Port flushed with 10 ml ns, -br. Decadron started at 20 minute rate. Caprice Renshaw, RN  1140: Infusion complete. Port flushed with 10 ml, no br. Taxol started at 30 minute rate. Caprice Renshaw, RN  1249: Taxol complete. Pt denies complaints. Port flushed with 56ml NS; +br noted. Carboplatin started to infuse at 30 minute rate. Nadja Lina, RN  1327: Carboplatin complete. Pt denies complaints. Port flushed with 48ml NS; +Br noted. Heparinized per policy. Site deaccessed intact and covered with a bandaid. Pt left infusion via wheelchair with her family. Dennard Schaumann, RN

## 2018-08-19 ENCOUNTER — Inpatient Hospital Stay (HOSPITAL_COMMUNITY)
Admission: RE | Admit: 2018-08-19 | Discharge: 2018-08-19 | Disposition: A | Discharge status: Still In Hospital | Payer: Medicare Other | Source: Ambulatory Visit

## 2018-08-19 ENCOUNTER — Other Ambulatory Visit (HOSPITAL_COMMUNITY): Payer: Self-pay | Admitting: Radiology

## 2018-08-19 ENCOUNTER — Ambulatory Visit (HOSPITAL_COMMUNITY): Payer: Self-pay | Admitting: Radiation Oncology

## 2018-08-19 DIAGNOSIS — Z7982 Long term (current) use of aspirin: Secondary | ICD-10-CM | POA: Diagnosis not present

## 2018-08-19 DIAGNOSIS — Z7951 Long term (current) use of inhaled steroids: Secondary | ICD-10-CM | POA: Diagnosis not present

## 2018-08-19 DIAGNOSIS — G4730 Sleep apnea, unspecified: Secondary | ICD-10-CM | POA: Diagnosis not present

## 2018-08-19 DIAGNOSIS — Z510 Encounter for antineoplastic radiation therapy: Secondary | ICD-10-CM | POA: Diagnosis not present

## 2018-08-19 DIAGNOSIS — C3431 Malignant neoplasm of lower lobe, right bronchus or lung: Secondary | ICD-10-CM | POA: Diagnosis not present

## 2018-08-19 DIAGNOSIS — T451X5A Adverse effect of antineoplastic and immunosuppressive drugs, initial encounter: Secondary | ICD-10-CM | POA: Diagnosis not present

## 2018-08-19 DIAGNOSIS — J449 Chronic obstructive pulmonary disease, unspecified: Secondary | ICD-10-CM | POA: Diagnosis not present

## 2018-08-19 DIAGNOSIS — J45909 Unspecified asthma, uncomplicated: Secondary | ICD-10-CM | POA: Diagnosis not present

## 2018-08-19 DIAGNOSIS — Z87891 Personal history of nicotine dependence: Secondary | ICD-10-CM | POA: Diagnosis not present

## 2018-08-19 DIAGNOSIS — R112 Nausea with vomiting, unspecified: Secondary | ICD-10-CM | POA: Diagnosis not present

## 2018-08-19 DIAGNOSIS — Z8541 Personal history of malignant neoplasm of cervix uteri: Secondary | ICD-10-CM | POA: Diagnosis not present

## 2018-08-19 DIAGNOSIS — Z5111 Encounter for antineoplastic chemotherapy: Secondary | ICD-10-CM | POA: Diagnosis not present

## 2018-08-19 DIAGNOSIS — D6959 Other secondary thrombocytopenia: Secondary | ICD-10-CM | POA: Diagnosis not present

## 2018-08-19 DIAGNOSIS — I10 Essential (primary) hypertension: Secondary | ICD-10-CM | POA: Diagnosis not present

## 2018-08-19 DIAGNOSIS — Z7901 Long term (current) use of anticoagulants: Secondary | ICD-10-CM | POA: Diagnosis not present

## 2018-08-19 DIAGNOSIS — M1990 Unspecified osteoarthritis, unspecified site: Secondary | ICD-10-CM | POA: Diagnosis not present

## 2018-08-19 DIAGNOSIS — Z79899 Other long term (current) drug therapy: Secondary | ICD-10-CM | POA: Diagnosis not present

## 2018-08-19 DIAGNOSIS — Z85038 Personal history of other malignant neoplasm of large intestine: Secondary | ICD-10-CM | POA: Diagnosis not present

## 2018-08-19 DIAGNOSIS — I4891 Unspecified atrial fibrillation: Secondary | ICD-10-CM | POA: Diagnosis not present

## 2018-08-19 DIAGNOSIS — E785 Hyperlipidemia, unspecified: Secondary | ICD-10-CM | POA: Diagnosis not present

## 2018-08-20 LAB — ADULT ROUTINE BLOOD CULTURE, SET OF 2 BOTTLES (BACTERIA AND YEAST): BLOOD CULTURE, ROUTINE: NO GROWTH

## 2018-08-21 ENCOUNTER — Inpatient Hospital Stay
Admission: RE | Admit: 2018-08-21 | Discharge: 2018-08-21 | Disposition: A | Discharge status: Still In Hospital | Payer: Medicare Other | Source: Ambulatory Visit | Attending: Radiation Oncology | Admitting: Radiation Oncology

## 2018-08-21 ENCOUNTER — Inpatient Hospital Stay (HOSPITAL_COMMUNITY)
Admission: RE | Admit: 2018-08-21 | Discharge: 2018-08-21 | Disposition: A | Discharge status: Still In Hospital | Payer: Medicare Other | Source: Ambulatory Visit

## 2018-08-21 NOTE — Nurses Notes (Signed)
Wt 290 lbs.  Pt in room 2 for OTV Lung. Pt denies any complaints or changes to medication/allergies. Jimmye Norman, RN

## 2018-08-21 NOTE — Progress Notes (Signed)
3240 rad  Still doing OK  No worse of any sx  cpm

## 2018-08-22 ENCOUNTER — Ambulatory Visit (HOSPITAL_COMMUNITY): Payer: Medicare Other

## 2018-08-25 ENCOUNTER — Inpatient Hospital Stay (HOSPITAL_COMMUNITY): Payer: Medicare Other

## 2018-08-25 ENCOUNTER — Other Ambulatory Visit (HOSPITAL_COMMUNITY): Payer: Self-pay

## 2018-08-25 ENCOUNTER — Inpatient Hospital Stay (HOSPITAL_COMMUNITY)
Admission: RE | Admit: 2018-08-25 | Discharge: 2018-08-25 | Disposition: A | Discharge status: Still In Hospital | Payer: Medicare Other | Source: Ambulatory Visit | Admitting: Radiation Oncology

## 2018-08-25 ENCOUNTER — Inpatient Hospital Stay (HOSPITAL_COMMUNITY)
Admission: RE | Admit: 2018-08-25 | Disposition: A | Discharge status: Still In Hospital | Payer: Medicare Other | Source: Ambulatory Visit

## 2018-08-25 ENCOUNTER — Ambulatory Visit (HOSPITAL_BASED_OUTPATIENT_CLINIC_OR_DEPARTMENT_OTHER): Payer: Medicare Other | Admitting: Nurse Practitioner

## 2018-08-25 ENCOUNTER — Ambulatory Visit: Payer: Medicare Other | Attending: Nurse Practitioner | Admitting: Nurse Practitioner

## 2018-08-25 ENCOUNTER — Ambulatory Visit (HOSPITAL_COMMUNITY)
Admission: RE | Admit: 2018-08-25 | Discharge: 2018-08-25 | Disposition: A | Discharge status: Home / Self Care | Payer: Medicare Other | Source: Ambulatory Visit

## 2018-08-25 ENCOUNTER — Inpatient Hospital Stay (HOSPITAL_COMMUNITY): Payer: Medicare Other | Admitting: Family Medicine

## 2018-08-25 ENCOUNTER — Inpatient Hospital Stay
Admission: AD | Admit: 2018-08-25 | Discharge: 2018-08-29 | DRG: 809 | Disposition: A | Discharge status: Home / Self Care | Payer: Medicare Other | Source: Ambulatory Visit | Attending: Family Medicine | Admitting: Family Medicine

## 2018-08-25 ENCOUNTER — Encounter (HOSPITAL_COMMUNITY): Payer: Self-pay

## 2018-08-25 ENCOUNTER — Encounter (HOSPITAL_COMMUNITY): Payer: Self-pay | Admitting: Nurse Practitioner

## 2018-08-25 VITALS — BP 145/95 | HR 87 | Temp 99.3°F | Resp 16 | Ht 62.0 in | Wt 280.0 lb

## 2018-08-25 DIAGNOSIS — I10 Essential (primary) hypertension: Secondary | ICD-10-CM | POA: Insufficient documentation

## 2018-08-25 DIAGNOSIS — C3490 Malignant neoplasm of unspecified part of unspecified bronchus or lung: Secondary | ICD-10-CM

## 2018-08-25 DIAGNOSIS — D6959 Other secondary thrombocytopenia: Secondary | ICD-10-CM

## 2018-08-25 DIAGNOSIS — Z87891 Personal history of nicotine dependence: Secondary | ICD-10-CM

## 2018-08-25 DIAGNOSIS — G4730 Sleep apnea, unspecified: Secondary | ICD-10-CM | POA: Insufficient documentation

## 2018-08-25 DIAGNOSIS — J449 Chronic obstructive pulmonary disease, unspecified: Secondary | ICD-10-CM | POA: Insufficient documentation

## 2018-08-25 DIAGNOSIS — Z9884 Bariatric surgery status: Secondary | ICD-10-CM

## 2018-08-25 DIAGNOSIS — C189 Malignant neoplasm of colon, unspecified: Secondary | ICD-10-CM | POA: Insufficient documentation

## 2018-08-25 DIAGNOSIS — R112 Nausea with vomiting, unspecified: Secondary | ICD-10-CM | POA: Diagnosis present

## 2018-08-25 DIAGNOSIS — D701 Agranulocytosis secondary to cancer chemotherapy: Secondary | ICD-10-CM | POA: Diagnosis present

## 2018-08-25 DIAGNOSIS — C3431 Malignant neoplasm of lower lobe, right bronchus or lung: Secondary | ICD-10-CM | POA: Diagnosis present

## 2018-08-25 DIAGNOSIS — Z85038 Personal history of other malignant neoplasm of large intestine: Secondary | ICD-10-CM

## 2018-08-25 DIAGNOSIS — I480 Paroxysmal atrial fibrillation: Secondary | ICD-10-CM

## 2018-08-25 DIAGNOSIS — R5081 Fever presenting with conditions classified elsewhere: Secondary | ICD-10-CM

## 2018-08-25 DIAGNOSIS — I4891 Unspecified atrial fibrillation: Secondary | ICD-10-CM

## 2018-08-25 DIAGNOSIS — E785 Hyperlipidemia, unspecified: Secondary | ICD-10-CM | POA: Diagnosis present

## 2018-08-25 DIAGNOSIS — Z6841 Body Mass Index (BMI) 40.0 and over, adult: Secondary | ICD-10-CM

## 2018-08-25 DIAGNOSIS — Z7951 Long term (current) use of inhaled steroids: Secondary | ICD-10-CM

## 2018-08-25 DIAGNOSIS — Z7901 Long term (current) use of anticoagulants: Secondary | ICD-10-CM

## 2018-08-25 DIAGNOSIS — Z90710 Acquired absence of both cervix and uterus: Secondary | ICD-10-CM

## 2018-08-25 DIAGNOSIS — Z8541 Personal history of malignant neoplasm of cervix uteri: Secondary | ICD-10-CM

## 2018-08-25 DIAGNOSIS — T451X5A Adverse effect of antineoplastic and immunosuppressive drugs, initial encounter: Secondary | ICD-10-CM | POA: Diagnosis present

## 2018-08-25 DIAGNOSIS — D709 Neutropenia, unspecified: Secondary | ICD-10-CM

## 2018-08-25 DIAGNOSIS — Z510 Encounter for antineoplastic radiation therapy: Secondary | ICD-10-CM | POA: Insufficient documentation

## 2018-08-25 DIAGNOSIS — R509 Fever, unspecified: Secondary | ICD-10-CM

## 2018-08-25 DIAGNOSIS — K5909 Other constipation: Secondary | ICD-10-CM

## 2018-08-25 DIAGNOSIS — B961 Klebsiella pneumoniae [K. pneumoniae] as the cause of diseases classified elsewhere: Secondary | ICD-10-CM | POA: Diagnosis present

## 2018-08-25 DIAGNOSIS — D696 Thrombocytopenia, unspecified: Secondary | ICD-10-CM | POA: Diagnosis present

## 2018-08-25 DIAGNOSIS — Z5111 Encounter for antineoplastic chemotherapy: Secondary | ICD-10-CM

## 2018-08-25 DIAGNOSIS — R059 Cough, unspecified: Secondary | ICD-10-CM

## 2018-08-25 DIAGNOSIS — R0609 Other forms of dyspnea: Secondary | ICD-10-CM

## 2018-08-25 DIAGNOSIS — E669 Obesity, unspecified: Secondary | ICD-10-CM | POA: Diagnosis present

## 2018-08-25 DIAGNOSIS — C781 Secondary malignant neoplasm of mediastinum: Secondary | ICD-10-CM

## 2018-08-25 DIAGNOSIS — Z79899 Other long term (current) drug therapy: Secondary | ICD-10-CM | POA: Insufficient documentation

## 2018-08-25 DIAGNOSIS — R197 Diarrhea, unspecified: Secondary | ICD-10-CM

## 2018-08-25 DIAGNOSIS — N390 Urinary tract infection, site not specified: Secondary | ICD-10-CM | POA: Diagnosis present

## 2018-08-25 DIAGNOSIS — D703 Neutropenia due to infection: Principal | ICD-10-CM | POA: Diagnosis present

## 2018-08-25 DIAGNOSIS — R05 Cough: Secondary | ICD-10-CM

## 2018-08-25 LAB — COMPREHENSIVE METABOLIC PANEL, NON-FASTING
ALBUMIN: 4 g/dL (ref 3.2–4.6)
ALKALINE PHOSPHATASE: 64 U/L (ref 20–130)
ALT (SGPT): 31 U/L (ref <=52)
ANION GAP: 6 mmol/L
AST (SGOT): 26 U/L (ref <=35)
BILIRUBIN TOTAL: 0.9 mg/dL (ref 0.3–1.2)
BUN/CREA RATIO: 19
BUN: 13 mg/dL (ref 10–25)
CALCIUM: 9 mg/dL (ref 8.8–10.3)
CHLORIDE: 101 mmol/L (ref 98–111)
CO2 TOTAL: 30 mmol/L (ref 21–35)
CREATININE: 0.69 mg/dL (ref <=1.30)
ESTIMATED GFR: >60 mL/min/1.73mˆ2
ESTIMATED GFR: >60 mL/min/{1.73_m2}
GLUCOSE: 139 mg/dL — ABNORMAL HIGH (ref 70–110)
POTASSIUM: 3.9 mmol/L (ref 3.5–5.0)
PROTEIN TOTAL: 6.3 g/dL (ref 6.0–8.3)
SODIUM: 137 mmol/L (ref 135–145)

## 2018-08-25 LAB — CBC WITH DIFF
BASOPHIL #: 0 x10?3/uL (ref 0.00–0.20)
BASOPHIL #: 0 x10ˆ3/uL (ref 0.00–0.20)
BASOPHIL %: 1 %
BASOPHIL %: 1 %
EOSINOPHIL #: 0 x10ˆ3/uL (ref 0.00–0.50)
EOSINOPHIL %: 1 %
HCT: 35.6 % (ref 34.6–46.2)
HGB: 11.9 g/dL (ref 11.8–15.8)
LYMPHOCYTE #: 0.3 x10ˆ3/uL — ABNORMAL LOW (ref 0.90–3.40)
LYMPHOCYTE %: 26 %
MCH: 26.4 pg — ABNORMAL LOW (ref 27.6–33.2)
MCHC: 33.6 g/dL (ref 32.6–35.4)
MCV: 78.5 fL — ABNORMAL LOW (ref 82.3–96.7)
MCV: 78.5 fL — ABNORMAL LOW (ref 82.3–96.7)
MONOCYTE #: 0.2 x10ˆ3/uL (ref 0.20–0.90)
MONOCYTE %: 15 %
MPV: 8.2 fL (ref 6.6–10.2)
NEUTROPHIL #: 0.7 x10?3/uL — ABNORMAL LOW (ref 1.50–6.40)
NEUTROPHIL #: 0.7 x10ˆ3/uL — ABNORMAL LOW (ref 1.50–6.40)
NEUTROPHIL %: 57 %
PLATELETS: 153 x10ˆ3/uL (ref 140–440)
RBC: 4.53 x10ˆ6/uL (ref 3.80–5.24)
RDW: 15.4 % — ABNORMAL HIGH (ref 12.4–15.2)
WBC: 1.3 x10ˆ3/uL — ABNORMAL LOW (ref 3.5–10.3)

## 2018-08-25 LAB — URINALYSIS, MACRO/MICRO
GLUCOSE: NEGATIVE mg/dL
KETONES: NEGATIVE mg/dL
LEUKOCYTES: NEGATIVE WBCs/uL
NITRITE: NEGATIVE
PH: 5.5 (ref 5.0–7.0)
PROTEIN: NEGATIVE mg/dL
SPECIFIC GRAVITY: 1.024 (ref 1.010–1.025)
UROBILINOGEN: 1 mg/dL

## 2018-08-25 MED ORDER — CARVEDILOL 6.25 MG TABLET
18.75 mg | ORAL_TABLET | Freq: Two times a day (BID) | ORAL | Status: DC
Start: 2018-08-25 — End: 2018-08-29
  Administered 2018-08-25 – 2018-08-29 (×9): 18.75 mg via ORAL
  Filled 2018-08-25 (×12): qty 1

## 2018-08-25 MED ORDER — DOCUSATE SODIUM 100 MG CAPSULE
100.00 mg | ORAL_CAPSULE | Freq: Three times a day (TID) | ORAL | Status: DC | PRN
Start: 2018-08-25 — End: 2018-08-29

## 2018-08-25 MED ORDER — FLECAINIDE 50 MG TABLET
50.00 mg | ORAL_TABLET | Freq: Two times a day (BID) | ORAL | Status: DC
Start: 2018-08-25 — End: 2018-08-26
  Administered 2018-08-25 – 2018-08-26 (×3): 50 mg via ORAL
  Filled 2018-08-25 (×6): qty 1

## 2018-08-25 MED ORDER — ATORVASTATIN 10 MG TABLET
10.0000 mg | ORAL_TABLET | Freq: Every day | ORAL | Status: DC
Start: 2018-08-25 — End: 2018-08-29
  Administered 2018-08-25 – 2018-08-29 (×5): 10 mg via ORAL
  Filled 2018-08-25 (×6): qty 1

## 2018-08-25 MED ORDER — DULOXETINE 30 MG CAPSULE,DELAYED RELEASE
30.0000 mg | DELAYED_RELEASE_CAPSULE | Freq: Every day | ORAL | Status: DC
Start: 2018-08-25 — End: 2018-08-29
  Administered 2018-08-25 – 2018-08-29 (×6): 30 mg via ORAL
  Filled 2018-08-25 (×6): qty 1

## 2018-08-25 MED ORDER — CARVEDILOL 6.25 MG TABLET
6.25 mg | ORAL_TABLET | Freq: Every evening | ORAL | Status: DC
Start: 2018-08-25 — End: 2018-08-25

## 2018-08-25 MED ORDER — APIXABAN 5 MG TABLET
5.00 mg | ORAL_TABLET | Freq: Two times a day (BID) | ORAL | Status: DC
Start: 2018-08-25 — End: 2018-08-29
  Administered 2018-08-25 – 2018-08-29 (×9): 5 mg via ORAL
  Filled 2018-08-25 (×13): qty 1

## 2018-08-25 MED ORDER — SODIUM CHLORIDE 0.9 % INTRAVENOUS SOLUTION
INTRAVENOUS | Status: DC
Start: 2018-08-25 — End: 2018-08-29

## 2018-08-25 MED ORDER — ALBUTEROL SULFATE 2.5 MG/3 ML (0.083 %) SOLUTION FOR NEBULIZATION
2.50 mg | INHALATION_SOLUTION | Freq: Four times a day (QID) | RESPIRATORY_TRACT | Status: DC
Start: 2018-08-25 — End: 2018-08-29
  Administered 2018-08-25 (×2): 0 mg via RESPIRATORY_TRACT
  Administered 2018-08-26 (×4): 2.5 mg via RESPIRATORY_TRACT
  Administered 2018-08-27 (×2): 0 mg via RESPIRATORY_TRACT
  Administered 2018-08-27: 2.5 mg via RESPIRATORY_TRACT
  Administered 2018-08-27: 0 mg via RESPIRATORY_TRACT
  Administered 2018-08-28: 2.5 mg via RESPIRATORY_TRACT
  Administered 2018-08-28 (×2): 0 mg via RESPIRATORY_TRACT
  Administered 2018-08-28 – 2018-08-29 (×3): 2.5 mg via RESPIRATORY_TRACT
  Filled 2018-08-25 (×24): qty 3

## 2018-08-25 MED ORDER — MORPHINE 10 MG/ML INTRAVENOUS SYRINGE
2.00 mg | INJECTION | INTRAVENOUS | Status: DC | PRN
Start: 2018-08-25 — End: 2018-08-25
  Administered 2018-08-25: 2 mg via INTRAVENOUS
  Filled 2018-08-25 (×2): qty 1

## 2018-08-25 MED ORDER — NYSTATIN 100,000 UNIT/ML ORAL SUSPENSION
5.00 mL | Freq: Four times a day (QID) | ORAL | Status: DC
Start: 2018-08-25 — End: 2018-08-29
  Administered 2018-08-25 – 2018-08-28 (×13): 5 mL via ORAL
  Administered 2018-08-28: 0 mL via ORAL
  Administered 2018-08-28 – 2018-08-29 (×2): 5 mL via ORAL
  Filled 2018-08-25 (×22): qty 5

## 2018-08-25 MED ORDER — SODIUM CHLORIDE 0.9 % (FLUSH) INJECTION SYRINGE
3.0000 mL | INJECTION | Freq: Three times a day (TID) | INTRAMUSCULAR | Status: DC
Start: 2018-08-25 — End: 2018-08-29
  Administered 2018-08-25 – 2018-08-28 (×9): 0 mL
  Administered 2018-08-28: 3 mL
  Administered 2018-08-28 – 2018-08-29 (×2): 0 mL

## 2018-08-25 MED ORDER — BUDESONIDE-FORMOTEROL HFA 160 MCG-4.5 MCG/ACTUATION - EAST
2.00 | INHALATION_SPRAY | Freq: Two times a day (BID) | RESPIRATORY_TRACT | Status: DC
Start: 2018-08-25 — End: 2018-08-29
  Administered 2018-08-25: 0 via RESPIRATORY_TRACT
  Administered 2018-08-25 – 2018-08-29 (×9): 2 via RESPIRATORY_TRACT
  Filled 2018-08-25 (×2): qty 6

## 2018-08-25 MED ORDER — POTASSIUM CHLORIDE ER 20 MEQ TABLET,EXTENDED RELEASE(PART/CRYST)
20.0000 meq | ORAL_TABLET | Freq: Every day | ORAL | Status: DC
Start: 2018-08-25 — End: 2018-08-29
  Administered 2018-08-25 – 2018-08-29 (×5): 20 meq via ORAL
  Filled 2018-08-25 (×6): qty 1

## 2018-08-25 MED ORDER — MORPHINE 2 MG/ML INTRAVENOUS SYRINGE
2.00 mg | INJECTION | INTRAVENOUS | Status: DC | PRN
Start: 2018-08-25 — End: 2018-08-25

## 2018-08-25 MED ORDER — OXYCODONE 5 MG TABLET
5.00 mg | ORAL_TABLET | Freq: Four times a day (QID) | ORAL | Status: DC | PRN
Start: 2018-08-25 — End: 2018-08-29
  Administered 2018-08-25 – 2018-08-26 (×5): 5 mg via ORAL
  Administered 2018-08-26: 0 mg via ORAL
  Administered 2018-08-27 – 2018-08-29 (×7): 5 mg via ORAL
  Filled 2018-08-25 (×14): qty 1

## 2018-08-25 MED ORDER — MAGNESIUM OXIDE 400 MG (241.3 MG MAGNESIUM) TABLET
400.0000 mg | ORAL_TABLET | Freq: Every day | ORAL | Status: DC
Start: 2018-08-25 — End: 2018-08-29
  Administered 2018-08-25 – 2018-08-29 (×5): 400 mg via ORAL
  Filled 2018-08-25 (×5): qty 1

## 2018-08-25 MED ORDER — MORPHINE 2 MG/ML INTRAVENOUS SYRINGE
2.00 mg | INJECTION | INTRAVENOUS | Status: DC | PRN
Start: 2018-08-25 — End: 2018-08-29
  Administered 2018-08-25 – 2018-08-28 (×7): 2 mg via INTRAVENOUS
  Filled 2018-08-25 (×7): qty 1

## 2018-08-25 MED ORDER — PROCHLORPERAZINE MALEATE 10 MG TABLET
10.00 mg | ORAL_TABLET | Freq: Four times a day (QID) | ORAL | Status: DC | PRN
Start: 2018-08-25 — End: 2018-08-29
  Administered 2018-08-26 – 2018-08-27 (×2): 10 mg via ORAL
  Filled 2018-08-25: qty 1

## 2018-08-25 MED ORDER — SODIUM CHLORIDE 0.9 % (FLUSH) INJECTION SYRINGE
3.0000 mL | INJECTION | INTRAMUSCULAR | Status: DC | PRN
Start: 2018-08-25 — End: 2018-08-29

## 2018-08-25 MED ORDER — LINACLOTIDE 72 MCG CAPSULE
72.00 ug | ORAL_CAPSULE | Freq: Every morning | ORAL | Status: DC
Start: 2018-08-25 — End: 2018-08-29
  Administered 2018-08-25 – 2018-08-29 (×5): 72 ug via ORAL
  Filled 2018-08-25 (×8): qty 1

## 2018-08-25 MED ORDER — CEFEPIME 2G IN NS 50ML MINIBAG IVPB
2.00 g | Freq: Three times a day (TID) | INTRAMUSCULAR | Status: DC
Start: 2018-08-25 — End: 2018-08-28
  Administered 2018-08-25: 0 g via INTRAVENOUS
  Administered 2018-08-25: 2 g via INTRAVENOUS
  Administered 2018-08-25: 0 g via INTRAVENOUS
  Administered 2018-08-25 – 2018-08-26 (×3): 2 g via INTRAVENOUS
  Administered 2018-08-26 (×2): 0 g via INTRAVENOUS
  Administered 2018-08-26: 2 g via INTRAVENOUS
  Administered 2018-08-26: 0 g via INTRAVENOUS
  Administered 2018-08-27 (×2): 2 g via INTRAVENOUS
  Administered 2018-08-27 (×2): 0 g via INTRAVENOUS
  Administered 2018-08-27: 2 g via INTRAVENOUS
  Administered 2018-08-27 – 2018-08-28 (×2): 0 g via INTRAVENOUS
  Administered 2018-08-28: 2 g via INTRAVENOUS
  Filled 2018-08-25 (×15): qty 50

## 2018-08-25 MED ORDER — PANTOPRAZOLE 40 MG TABLET,DELAYED RELEASE
40.00 mg | DELAYED_RELEASE_TABLET | Freq: Every morning | ORAL | Status: DC
Start: 2018-08-25 — End: 2018-08-29
  Administered 2018-08-25 – 2018-08-29 (×5): 40 mg via ORAL
  Filled 2018-08-25 (×7): qty 1

## 2018-08-25 MED ADMIN — atorvastatin 10 mg tablet: ORAL | @ 18:00:00

## 2018-08-25 MED ADMIN — oxyCODONE 5 mg tablet: ORAL | @ 18:00:00

## 2018-08-25 MED ADMIN — sodium chloride 0.9 % intravenous solution: INTRAVENOUS | @ 21:00:00 | NDC 00338004904

## 2018-08-25 MED ADMIN — lactated Ringers intravenous solution: RESPIRATORY_TRACT | @ 19:00:00

## 2018-08-25 NOTE — Ancillary Notes (Signed)
Medical Nutrition Therapy Assessment        SUBJECTIVE : MD consult for nutritional assessment.  Pt admitted for neutropenic fever.  Pt with history of Stg III colon cancer and new diagnosis of Stg III lung cancer.  Pt reports decreased appetite with 28# weight loss over the last 4 months.  Pt c/o difficulty swallowing and reports using "numbing liquid" at home. Pt would like to receive while in hospital.  Nursing alerted to patient request.   Pt agreeable to CIB and Thrive ice cream and also agreeable to snacks when diet advanced.  Discussed ways to boost intake at home and reviewed ways to to increase protein and caloric intake at home.    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  Ideal Body Weight (IBW) (kg): 50.45  % Ideal Body Weight: 249.95  Adjusted/Standard Body Weight  Adjusted BW: 69.4kg     Weight Loss: 4.536 kg (10 lb)(x 1 mo 3.4%  and 28# x 4 months  9.1%)      Physical Assessment: obese    Estimated Needs:    Energy Calorie Requirements: 1950-2200 per day (28-32kcals/69.4kg)  Protein Requirements (gms/day): 83-97 per day (1.2-1.4 g/69.4kg)  Fluid Requirements: 1950 per day (80mLs/69.4kg)    Plan/Interventions :   Advance diet as medically able  Will send CIB BID and Thrive ice cream q day  When able to advance diet will add additional snacks BID  Monitor labs/wt and encourage po intake  RD avail as needed.    Nutrition Diagnosis: Swallowing difficulty related to Current medical condition as evidenced by Unintentional weight loss     Clarisa Fling, RDLD  08/25/2018, 15:45      Pager # [phone]

## 2018-08-25 NOTE — Nurses Notes (Signed)
Port re-accessed on admission. Little to no blood return. Doctor was paged to advise, awaiting response. One set of blood cultures done peripheral. Only able to obtain 81mL on 2nd blood culture. Pediatric bottle used per policy. Called lab to advise who stated needed 45mL and to make patient LSO and re-order blood cultures stat. Manager advised okay to send due to policy 7-5PY for pediatric bottle. Violeta Gelinas, RN

## 2018-08-25 NOTE — H&P (Signed)
Family Medicine     History and Physical    [name], [name], 68 y.o. female  Date of Admission:  08/25/2018  Date of Birth:  [birth year]    Information Obtained from: patient and history reviewed via medical record  Chief Complaint:  Neutropenic Fever    HPI: Kelsey [name] is a 69 y.o., White female with medical history of Stage III colon cancer now with stage III lung cancer (started chemoradiation 07/23/18, last treatment 08/21/17), COPD, A Fib, and hyperlipidemia who presents with fever of 1 day. She states she took her temperature at home last night an it was 103. She states she took some tylenol and it came down. She says she has been sick for the past few days with a cold. She says she has had some green sputum, cough, congestion. She says she has become a little more short of breath and has had some nausea and diarrhea. She denies any blurry vision, HA, hematochezia, or hemoptysis.     Labs today in Oncology clinic were significant for [place] 700 along with temperature in clinic of 99.3. She will be admitted for cultures, IV antibiotics, and monitoring of ANC.     Past Medical History:   Diagnosis Date   . A-fib (CMS HCC)    . Arthropathy, unspecified, site unspecified    . Asthma    . Cancer (CMS HCC)     cervical   . Cataract    . Colon cancer (CMS [facility]) 07/06/2014   . COPD (chronic obstructive pulmonary disease) (CMS HCC)    . Fistula    . HTN (hypertension)    . Hyperlipidemia    . Obesity    . Sleep apnea    . Ventral hernia          Past Surgical History:   Procedure Laterality Date   . ABCESS DRAINAGE     . BOWEL RESECTION     . CATARACT EXTRACTION     . HX CERVICAL CONE BIOPSY      . HX CESAREAN SECTION     . HX GASTRIC BYPASS      25 years ago   . [place] (x2)   . HX HYSTERECTOMY      late 1990s   . HX LAP BANDING      2013   . HX LAPAROTOMY      2013 to remove lap band         Medications Prior to Admission     Prescriptions    acetaminophen (TYLENOL) 325 mg Oral Tablet    Take 2  Tabs (650 mg total) by mouth Every 6 hours as needed    albuterol sulfate (PROVENTIL) 2.5 mg /3 mL (0.083 %) Inhalation Solution for Nebulization    3 mL (2.5 mg total) by Nebulization route Every 6 hours    apixaban (ELIQUIS) 5 mg Oral Tablet    Take 1 Tab (5 mg total) by mouth Twice daily    atorvastatin (LIPITOR) 10 mg Oral Tablet    Take 10 mg by mouth Once a day    budesonide-formoterol (SYMBICORT) 160-4.5 mcg/actuation Inhalation HFA Aerosol Inhaler    Take 2 Puffs by inhalation Twice daily    CARVEDILOL ORAL    Take 6.25 mg by mouth Every evening     cephalexin (KEFLEX) 500 mg Oral Capsule    Take 1 Cap (500 mg total) by mouth Three times a day  docusate sodium (COLACE) 100 mg Oral Capsule    Take 100 mg by mouth Three times a day as needed     DULoxetine (CYMBALTA DR) 30 mg Oral Capsule, Delayed Release(E.C.)    Take 30 mg by mouth Once a day    flecainide (TAMBOCOR) 50 mg Oral Tablet    Take 1 Tab (50 mg total) by mouth Twice daily    linaCLOtide (LINZESS) 72 mcg Oral Capsule    Take 72 mcg by mouth Every morning    loperamide (IMODIUM) 2 mg Oral Capsule    Take 2 mg by mouth Every 4 hours as needed    magnesium oxide (MAG-OX) 400 mg Oral Tablet    Take 400 mg by mouth Once a day    omeprazole (PRILOSEC) 20 mg Oral Capsule, Delayed Release(E.C.)    Take 40 mg by mouth Once a day     potassium chloride (K-DUR) 20 mEq Oral Tab Sust.Rel. Particle/Crystal    Take 20 mEq by mouth Once a day    prochlorperazine (COMPAZINE) 10 mg Oral Tablet    Take 1 Tab (10 mg total) by mouth Four times a day as needed for nausea/vomiting        Allergies   Allergen Reactions   . Shellfish Containing Products Shortness of Breath, Rash and Itching     scallops   . Vancomycin Shortness of Breath   . Barium Iodide    . Iodine And Iodide Containing Products      Itching, shortness of breath      Social History     Tobacco Use   . Smoking status: Former Research scientist (life sciences)   . Smokeless tobacco: Never Used   . Tobacco comment: quit 10 yrs ago      Substance Use Topics   . Alcohol use: Not Currently     Comment: rarely     Family History:     Family Medical History:     Problem Relation (Age of Onset)    Diabetes Mother, Maternal Grandmother, Maternal Grandfather, Father    Heart Attack Mother    Stroke Father           ROS: Other than ROS in the HPI, all other systems were negative.    EXAM:  Pain Score (Numeric, Faces): 9  Constitutional: appears chronically ill and morbidly obese  Eyes: Pupils equal and round, reactive to light and accomodation.   ENT: ENMT without erythema or injection, mucous membranes moist.  Neck: no thyromegaly or lymphadenopathy  Respiratory: Clear to auscultation bilaterally.   Cardiovascular: regular rate and rhythm  Gastrointestinal: Soft, non-tender  Musculoskeletal: Head atraumatic and normocephalic  Integumentary:  Skin warm and dry  Neurologic: Grossly normal  Lymphatic/Immunologic/Hematologic: No lymphadenopathy  Psychiatric: Normal        LABS:  I have reviewed all lab results.    Radiology Results: CXR pending     CODE Status:  Full Code    ASSESSMENT/PLAN:  Active Hospital Problems    Diagnosis   . Neutropenic fever (CMS HCC)     Neutropenic Fever in patient with Stage III Colon cancer now with Stage III lung cancer on Chemoradiation  - Possible causes include pneumonia vs URI vs UTI  - ANC 700  - Temp 99.3 on admission  - BCx, UA, UCx pending  - CXR pending  - Cefepime 2G Q8  - Morphine 2mg  Q4 PRN severe pain   - Holding chemoradiation  - Oncology, Dr. Deatra Canter, consulted    COPD  -  Having some shortness of breath  - CXR pending  - Continue home Symbicort BID, albuterol PRN    Atrial Fibrillation  - On Eliquis 5mg  BID, Flecainide 50mg  BID   - HR controlled in 80s    Hyperlipidemia  - continue Lipitor 10mg     Chemo Induced Nausea and Vomiting  - continue compazine 10mg  4x/day PRN    Chronic Constipation  - Continue home Linzess, colace    DVT Risk Factors:  Recent or metstatic malignancy and Age greater than 40  DVT/PE  Prophylaxis: Apixaban    Lovette Cliche, DO 1/6/202011:44  [facility]       08/25/2018  ________________________________________________________________________    H&P as per resident note above.   I have discussed the patient's case and management with the family medicine team.   I have reviewed the resident's history and physical exam and agree with the findings and plan of care as documented.  Any exceptions/additions are edited/noted.    Charles Mix, [place] 08/25/2018, 20:44  Faculty Physician  [facility]  Family Medicine Residency

## 2018-08-25 NOTE — Care Plan (Signed)
Pt tolerated all meds and meals. Chest port flushes but does not draw blood. Cultures drawn and pending. Abx administered. Pt ambulated throughout room with walker and remained free of falls. Pt c/o pain and received medication (see MAR). Pt remains in droplet isolation and neutropenic precautions. Pt resting in chair and voices no concerns at this time. Levora Dredge, RN

## 2018-08-25 NOTE — Progress Notes (Signed)
[place] [id number]        Encounter Date: 08/25/2018   9:30 AM EST    Name:  Kelsey [name]  Age: 68 y.o.  DOB: 03-Jun-1951  Sex: female  PCP: [facility]    Chief Complaint:    Chief Complaint   Patient presents with   . Treatment     1 week f/u      HISTORY OF PRESENT ILLNESS:   68 y.o. female with history of stage III colon cancer of present evaluation and management of lung cancer.    1. September 2015. Patient underwent sigmoidoscopy and was found to have polyp which was adenocarcinoma.    2. June 10, 2014. She was admitted for segmental resection of sigmoid colon. This revealed pT3, N1a, MX, low-grade  adenocarcinoma of the colon. The mass measuring 4 x 3 x 0.5 centimeters. It  was low-grade and there was no evidence of microsatellite instability by  histology. Margins were uninvolved and 4 lymph nodes only were removed which  only 1 was involved. This corresponding stage IIIB, T3, N1a, M0, low-grade  sigmoid cancer.     3. July 07, 2014. Recommended adjuvant chemotherapy with FOLFOX.    4. July 27, 2014. Patient was started on adjuvant chemotherapy with FOLFOX.  Patient received only 5 treatments and chemotherapy was stopped due to poor tolerance.  After that patient lost follow-up.      5. October 2019. Patient was admitted to hospital with concern for  bowel obstruction.  CT chest showed lung nodules.  Patient was managed conservatively and discharged home.    6. May 06, 2018.  Patient underwent CT-guided biopsy of lung lesion.  Pathology showed well-differentiated adenocarcinoma.  Section shows well-differentiated adenocarcinoma which positive for CK 7 and TTF 1, negative for P 63 and CK 20.     7. June 23, 2018. PET scan showed Hypermetabolic right lower lobe pulmonary mass compatible with malignancy.  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  Hepatomegaly and steatosis.     8. July 23, 2018. Patient was started on chemoradiation.    SUBJECTIVE:  Patient is here for follow-up treatment.  The patient reports a fever of 103 at home last night and took Tylenol because she stated that she "felt like she was burning up".  The patient rechecked temperature following Tylenol and temperature remained 102.1.  Patient did not contact oncologist on-call because she was coming to her appointment today.  Patient reports green yellow sputum and right lower lobe pain-stabbing in nature.  Patient reports exertional dyspnea.  Patient denies blurred vision, headaches, joint pain. Patient also report abdominal upset and diarrhea. Otherwise, patient is feeling ill.     REVIEW OF SYSTEMS:  GENERAL:  + fevers, chills  HEENT: no sore throat, congestion, blurry vision  Lungs: shortness of breath, cough, wheezes at this time  GI: no nausea, vomiting, constipation, reports diarrhea   GU: No dysuria, urgency, increased frequency   Cardiac: no chest pains, palpitations, syncope,   Neuro: no weakness, loss of function, numbness, tingling  Skin: no rash  Musculoskeletal: No myalgias  Psychosocial: No depression, anxiety  Hematologic: No easy bruising, abnormal bleeding  All other  review ROS negative except those mentioned above.     PATIENT HISTORY:  Past Medical History:   Diagnosis Date   . A-fib (CMS HCC)    . Arthropathy, unspecified, site unspecified    . Asthma    . Cancer (CMS HCC)     cervical   . Cataract    . Colon cancer (CMS [place]) 07/06/2014   . COPD (chronic obstructive pulmonary disease) (CMS HCC)    . Fistula    . HTN (hypertension)    . Hyperlipidemia    . Obesity    . Sleep apnea    . Ventral hernia        Past Surgical History:   Procedure Laterality Date   . ABCESS DRAINAGE     . BOWEL RESECTION     . CATARACT EXTRACTION     . HX CERVICAL CONE BIOPSY      . HX CESAREAN SECTION     . HX GASTRIC BYPASS      25 years ago   . [place] (x2)   .  HX HYSTERECTOMY      late 1990s   . HX LAP BANDING      2013   . HX LAPAROTOMY      2013 to remove lap band       Family Medical History:     Problem Relation (Age of Onset)    Diabetes Mother, Maternal Grandmother, Maternal Grandfather, Father    Heart Attack Mother    Stroke Father          Current Outpatient Medications   Medication Sig   . acetaminophen (TYLENOL) 325 mg Oral Tablet Take 2 Tabs (650 mg total) by mouth Every 6 hours as needed   . albuterol sulfate (PROVENTIL) 2.5 mg /3 mL (0.083 %) Inhalation Solution for Nebulization 3 mL (2.5 mg total) by Nebulization route Every 6 hours   . apixaban (ELIQUIS) 5 mg Oral Tablet Take 1 Tab (5 mg total) by mouth Twice daily   . atorvastatin (LIPITOR) 10 mg Oral Tablet Take 10 mg by mouth Once a day   . budesonide-formoterol (SYMBICORT) 160-4.5 mcg/actuation Inhalation HFA Aerosol Inhaler Take 2 Puffs by inhalation Twice daily   . CARVEDILOL ORAL Take 6.25 mg by mouth Every evening    . cephalexin (KEFLEX) 500 mg Oral Capsule Take 1 Cap (500 mg total) by mouth Three times a day   . docusate sodium (COLACE) 100 mg Oral Capsule Take 100 mg by mouth Three times a day as needed    . DULoxetine (CYMBALTA DR) 30 mg Oral Capsule, Delayed Release(E.C.) Take 30 mg by mouth Once a day   . flecainide (TAMBOCOR) 50 mg Oral Tablet Take 1 Tab (50 mg total) by mouth Twice daily   . linaCLOtide (LINZESS) 72 mcg Oral Capsule Take 72 mcg by mouth Every morning   . loperamide (IMODIUM) 2 mg Oral Capsule Take 2 mg by mouth Every 4 hours as needed   . magnesium oxide (MAG-OX) 400 mg Oral Tablet Take 400 mg by mouth Once a day   . omeprazole (PRILOSEC) 20 mg Oral Capsule, Delayed Release(E.C.) Take 40 mg by mouth Once a day    . potassium chloride (K-DUR) 20 mEq Oral Tab Sust.Rel. Particle/Crystal Take 20 mEq by mouth Once a day   . prochlorperazine (COMPAZINE) 10 mg Oral Tablet Take 1 Tab (10 mg total) by mouth Four times a day as needed for  nausea/vomiting     Social History          Socioeconomic History   . Marital status: Married     Spouse name: Not on file   . Number of children: Not on file   . Years of education: Not on file   . Highest education level: Not on file   Tobacco Use   . Smoking status: Former Research scientist (life sciences)   . Smokeless tobacco: Never Used   . Tobacco comment: quit 10 yrs ago   Substance and Sexual Activity   . Alcohol use: Not Currently     Comment: rarely   . Drug use: No   Other Topics Concern   . Ability to Walk 1 Flight of Steps without SOB/CP No   . Ability To Do Own ADL's Yes       PHYSICAL EXAMINATION:  VITALS:   Vitals:    08/25/18 1023   BP: (!) 145/95   Pulse: 87   Resp: 16   Temp: 37.4 C (99.3 F)   TempSrc: Thermal Scan   SpO2: 97%   Weight: 127 kg (280 lb)   Height: 1.575 m (5' 2" )   BMI: 51.32       PHYSICAL EXAM:   Consitutional: No acute distress.    Eyes: EOMI. No discharge. No Jaundice.   ENT: mucous membranes dry. No posterior pharynx lesions.. Neck supple, No palpable masses   Heme/ Lymph: No Cervical, Inguinal, axillary lymh nodes. No Bruising.   Cardiovascular: S1, S2, No murmurs, rubs, or gallops.   Respiratory: Clear to auscultate and percuss B/L, No rhonchi, crackles, or wheezing.   Abdomen: Normal Bowel Sounds, nontender nondistended. No hepatosplenomegaly.   Musculoskeletal: No Edema to the extremities.   Skin: Normal turgor. No Rashes,skin lesions   Psychiatry: Normal Affect   Neuro: No focal deficits. Alert and Oriented x 3      ENCOUNTER ORDERS:  Orders Placed This Encounter   . ADULT ROUTINE BLOOD CULTURE, SET OF 2 BOTTLES (BACTERIA AND YEAST)   . ADULT ROUTINE BLOOD CULTURE, SET OF 2 BOTTLES (BACTERIA AND YEAST)   . XR CHEST AP AND LATERAL   . URINALYSIS WITH REFLEX MICROSCOPIC AND CULTURE IF POSITIVE       LABORATORY/RADIOLOGICAL DATA: All pertinent labs/radiology were reviewed.   Results for AMOY, STEEVES (MRN [id number]) as of 08/25/2018 10:09   Ref. Range 08/25/2018 09:33   WBC Latest Ref Range: 3.5 - 10.3 x10^3/uL 1.3 (L)   HGB Latest Ref  Range: 11.8 - 15.8 g/dL 11.9   HCT Latest Ref Range: 34.6 - 46.2 % 35.6   PLATELET COUNT Latest Ref Range: 140 - 440 x10^3/uL 153   RBC Latest Ref Range: 3.80 - 5.24 x10^6/uL 4.53   MCV Latest Ref Range: 82.3 - 96.7 fL 78.5 (L)   MCHC Latest Ref Range: 32.6 - 35.4 g/dL 33.6   MCH Latest Ref Range: 27.6 - 33.2 pg 26.4 (L)   RDW Latest Ref Range: 12.4 - 15.2 % 15.4 (H)   MPV Latest Ref Range: 6.6 - 10.2 fL 8.2   PMN'S Latest Units: % 57   LYMPHOCYTES Latest Units: % 26   EOSINOPHIL Latest Units: % 1   MONOCYTES Latest Units: % 15   BASOPHILS Latest Units: % 1   PMN ABS Latest Ref Range: 1.50 - 6.40 x10^3/uL 0.70 (L)   LYMPHS ABS Latest Ref Range: 0.90 - 3.40 x10^3/uL 0.30 (L)   EOS ABS Latest Ref Range: 0.00 - 0.50 x10^3/uL 0.00  MONOS ABS Latest Ref Range: 0.20 - 0.90 x10^3/uL 0.20   BASOS ABS Latest Ref Range: 0.00 - 0.20 x10^3/uL 0.00      Ref. Range 08/25/2018 09:33   SODIUM Latest Ref Range: 135 - 145 mmol/L 137   POTASSIUM Latest Ref Range: 3.5 - 5.0 mmol/L 3.9   CHLORIDE Latest Ref Range: 98 - 111 mmol/L 101   CARBON DIOXIDE Latest Ref Range: 21 - 35 mmol/L 30   BUN Latest Ref Range: 10 - 25 mg/dL 13   CREATININE Latest Ref Range: <=1.30 mg/dL 0.69   GLUCOSE Latest Ref Range: 70 - 110 mg/dL 139 (H)   ANION GAP Latest Units: mmol/L 6   BUN/CREAT RATIO Unknown 19   ESTIMATED GLOMERULAR FILTRATION RATE Latest Ref Range: Avg: 85 mL/min/1.43m2 >60   CALCIUM Latest Ref Range: 8.8 - 10.3 mg/dL 9.0   TOTAL PROTEIN Latest Ref Range: 6.0 - 8.3 g/dL 6.3   ALBUMIN Latest Ref Range: 3.2 - 4.6 g/dL 4.0   BILIRUBIN, TOTAL Latest Ref Range: 0.3 - 1.2 mg/dL 0.9   AST (SGOT) Latest Ref Range: <=35 U/L 26   ALT (SGPT) Latest Ref Range: <=52 U/L 31   ALKALINE PHOSPHATASE Latest Ref Range: 20 - 130 U/L 64       ASSESSMENT AND PLAN:  68y.o. female with history of stage III colon cancer status post 5 cycle of FOLFOX now presenting with stage III lung cancer.  I explained the reason for referred to Hematology/Oncology Clinic.    1.  Adenocarcinoma lung:  Stage III.    CT brain to rule out metastatic disease.  NEGATIVE   Port in place.   Started on chemoradiation carboplatin/Taxol along with radiation- July 23, 2018.  After completion of chemo radiation patient will get PET scan.  If continued to have good response will start maintenance immunotherapy for 1 year.    CHEMOTHERAPY:/RADIATION  CYCLE DAY   DRUG DOSE TOTAL DOSE % ROUTE SCHEDULE   Carboplatin AUC- 2 205 mg    Weekly -- HOLD   Paclitaxel 45 mg/m2 108 mg    Weekly -- HOLD                     Shared decision.  I discussed diagnosis, prognosis and treatment option with patient and family.  I had detailed discussion with patient regarding chemotherapy and radiation.  I also discussed brain scan to rule out metastatic disease before starting treatment.  I counseled patient regarding chemotherapy side effects and their management.  Patient understand and want to proceed with treatment.      2. Neutropenia:  Due to chemotherapy.   [place]   3. CINV    Zofran PRN    4. Transaminitis:   resolved.    5. Thrombocytopenia:  Resolved   platelets today 153   will continue to monitor.    6. Neutropenic fever/diarrhea   Admission to hospitalist service    Cultures/CXR/UA placed     7. Exertional dyspnea/cough   CXR ordered       Patient admitted to in-patient family medicine service with attending Dr. CLoma Sousa    Return in about 1 week (around 09/01/2018) for cbc/diff, CMP, EXAM, Treatment, ADeatra Canter!!!!!!!!!!!!!!!!!!!!!!!!!!!!!!!!!!!!!!!!!!!.    CHerbie Drape NP      I saw and examined the patient.  I reviewed the NP s note.  I discussed the findings, assessment and plan of care as documented in the NP note and  agree with documentation above.  Any exceptions/additions are edited/noted.      Vanetta Mulders, MD  Hematology/Oncology

## 2018-08-25 NOTE — Nurses Notes (Addendum)
Pt in room 3 to see MAS before tx and OTV visit. Pt c/o fever, Headaches, Rt ear hurting, weakness and thirst. Pt stated her temp was 102.1 last night.  Pt stated that she drinks at least 5,  20 oz bottles of water a day. Vitals taken during this visit are as follows.   Temp 37.3  O2 Sat 99%  Pulse 81  BP  167/80.      IV Fluids per MAS. No Rad tx today per MAS. [id number] notified.   Pt has apt to see Omer Jack NP today at 9:30.  Jimmye Norman, RN

## 2018-08-26 ENCOUNTER — Ambulatory Visit (HOSPITAL_COMMUNITY): Payer: Self-pay | Admitting: Radiation Oncology

## 2018-08-26 ENCOUNTER — Ambulatory Visit (HOSPITAL_COMMUNITY): Payer: Medicare Other

## 2018-08-26 DIAGNOSIS — I4891 Unspecified atrial fibrillation: Secondary | ICD-10-CM

## 2018-08-26 DIAGNOSIS — R5081 Fever presenting with conditions classified elsewhere: Secondary | ICD-10-CM

## 2018-08-26 DIAGNOSIS — D703 Neutropenia due to infection: Secondary | ICD-10-CM

## 2018-08-26 DIAGNOSIS — C3431 Malignant neoplasm of lower lobe, right bronchus or lung: Secondary | ICD-10-CM

## 2018-08-26 DIAGNOSIS — N390 Urinary tract infection, site not specified: Secondary | ICD-10-CM

## 2018-08-26 LAB — ECG 12 LEAD - ADULT
Calculated P Axis: INVALID deg
Calculated P Axis: 66 deg
Calculated T Axis: 202 deg
Calculated T Axis: 151 deg
EKG Severity: ABNORMAL
EKG Severity: ABNORMAL
Heart Rate: 156 {beats}/min
Heart Rate: 75 {beats}/min
I 40 Axis: 17 deg
I 40 Axis: 23 deg
PR Interval: INVALID ms
PR Interval: 169 ms
QRS Axis: 56 deg
QRS Axis: 50 deg
QRS Duration: 87 ms
QRS Duration: 107 ms
QT Interval: 266 ms
QT Interval: 414 ms
QT Interval: 414 ms
QTC Calculation: 429 ms
QTC Calculation: 463 ms
ST Axis: 238 deg
ST Axis: 153 deg
T 40 Axis: 86 deg
T 40 Axis: 96 deg

## 2018-08-26 LAB — MAGNESIUM: MAGNESIUM: 1.5 mg/dL — ABNORMAL LOW (ref 1.8–2.3)

## 2018-08-26 LAB — RESPIRATORY VIRUS PANEL
ADENOVIRUS ARRAY: NOT DETECTED
BORDETELLA PERTUSSIS ARRAY: NOT DETECTED
CHLAMYDOPHILA PNEUMONIAE ARRAY: NOT DETECTED
CORONAVIRUS 229E: NOT DETECTED
CORONAVIRUS HKU1: NOT DETECTED
CORONAVIRUS NL63: NOT DETECTED
CORONAVIRUS OC43: NOT DETECTED
INFLUENZA A: NOT DETECTED
INFLUENZA B ARRAY: NOT DETECTED
METAPNEUMOVIRUS ARRAY: NOT DETECTED
MYCOPLASMA PNEUMONIAE ARRAY: NOT DETECTED
PARAINFLUENZA 1 ARRAY: NOT DETECTED
PARAINFLUENZA 2 ARRAY: NOT DETECTED
PARAINFLUENZA 3 ARRAY: NOT DETECTED
PARAINFLUENZA 4 ARRAY: NOT DETECTED
RHINOVIRUS/ENTEROVIRUS ARRAY: NOT DETECTED
RSV ARRAY: NOT DETECTED

## 2018-08-26 LAB — SCAN DIFFERENTIAL
PLATELET ESTIMATE: DECREASED
WBC MORPHOLOGY COMMENT: NORMAL

## 2018-08-26 LAB — CBC WITH DIFF
BASOPHIL #: 0 10*3/uL (ref 0.00–0.20)
BASOPHIL %: 1 %
EOSINOPHIL #: 0 10*3/uL (ref 0.00–0.50)
EOSINOPHIL %: 1 %
HCT: 32.2 % — ABNORMAL LOW (ref 34.6–46.2)
HGB: 10.8 g/dL — ABNORMAL LOW (ref 11.8–15.8)
LYMPHOCYTE #: 0.3 10*3/uL — ABNORMAL LOW (ref 0.90–3.40)
LYMPHOCYTE %: 21 %
MCH: 26.6 pg — ABNORMAL LOW (ref 27.6–33.2)
MCHC: 33.4 g/dL (ref 32.6–35.4)
MCV: 79.5 fL — ABNORMAL LOW (ref 82.3–96.7)
MONOCYTE #: 0.2 10*3/uL (ref 0.20–0.90)
MONOCYTE %: 18 %
MPV: 8.5 fL (ref 6.6–10.2)
NEUTROPHIL #: 0.8 10*3/uL — ABNORMAL LOW (ref 1.50–6.40)
NEUTROPHIL %: 59 %
PLATELETS: 137 10*3/uL — ABNORMAL LOW (ref 140–440)
RBC: 4.06 10*6/uL (ref 3.80–5.24)
RDW: 15.6 % — ABNORMAL HIGH (ref 12.4–15.2)
WBC: 1.3 10*3/uL — ABNORMAL LOW (ref 3.5–10.3)

## 2018-08-26 LAB — TROPONIN-I
TROPONIN I: 0 ng/mL (ref <=0.04)
TROPONIN I: 0.01 ng/mL (ref <=0.04)

## 2018-08-26 LAB — BASIC METABOLIC PANEL
ANION GAP: 7 mmol/L
BUN/CREA RATIO: 13
BUN: 9 mg/dL — ABNORMAL LOW (ref 10–25)
CALCIUM: 8.2 mg/dL — ABNORMAL LOW (ref 8.8–10.3)
CHLORIDE: 106 mmol/L (ref 98–111)
CO2 TOTAL: 25 mmol/L (ref 21–35)
CREATININE: 0.67 mg/dL (ref <=1.30)
ESTIMATED GFR: >60 mL/min/{1.73_m2}
GLUCOSE: 144 mg/dL — ABNORMAL HIGH (ref 70–110)
POTASSIUM: 3.5 mmol/L (ref 3.5–5.0)
SODIUM: 138 mmol/L (ref 135–145)

## 2018-08-26 LAB — PT/INR: INR: 1.62 — ABNORMAL HIGH (ref 0.80–1.10)

## 2018-08-26 LAB — PHOSPHORUS: PHOSPHORUS: 2.4 mg/dL — ABNORMAL LOW (ref 2.5–4.5)

## 2018-08-26 LAB — HGA1C (HEMOGLOBIN A1C WITH EST AVG GLUCOSE)
ESTIMATED AVERAGE GLUCOSE: 143 mg/dL
HEMOGLOBIN A1C: 6.6 % — ABNORMAL HIGH (ref 4.1–5.7)

## 2018-08-26 MED ORDER — PHENAZOPYRIDINE 100 MG TABLET
100.00 mg | ORAL_TABLET | Freq: Three times a day (TID) | ORAL | Status: DC
Start: 2018-08-26 — End: 2018-08-29
  Administered 2018-08-26: 0 mg via ORAL
  Administered 2018-08-26 – 2018-08-29 (×9): 100 mg via ORAL
  Filled 2018-08-26 (×16): qty 1

## 2018-08-26 MED ORDER — SODIUM CHLORIDE 0.9 % INTRAVENOUS SOLUTION
22.0000 mmol | Freq: Once | INTRAVENOUS | Status: AC
Start: 2018-08-26 — End: 2018-08-26
  Administered 2018-08-26: 16:00:00 0 mmol via INTRAVENOUS
  Administered 2018-08-26: 22 mmol via INTRAVENOUS
  Administered 2018-08-26: 0 mmol via INTRAVENOUS
  Filled 2018-08-26: qty 7.33

## 2018-08-26 MED ORDER — SODIUM CHLORIDE 0.9 % INTRAVENOUS SOLUTION
5.00 mg/h | INTRAVENOUS | Status: DC
Start: 2018-08-26 — End: 2018-08-26
  Filled 2018-08-26: qty 25

## 2018-08-26 MED ORDER — FILGRASTIM 480 MCG/1.6 ML INJECTION SOLUTION
480.0000 ug | Freq: Once | INTRAMUSCULAR | Status: AC
Start: 2018-08-26 — End: 2018-08-26
  Administered 2018-08-26: 480 ug via SUBCUTANEOUS
  Filled 2018-08-26: qty 1.6

## 2018-08-26 MED ORDER — NYSTATIN 100,000 UNIT/GRAM TOPICAL CREAM
TOPICAL_CREAM | Freq: Two times a day (BID) | CUTANEOUS | Status: DC
Start: 2018-08-26 — End: 2018-08-29
  Administered 2018-08-26: 0 via TOPICAL
  Filled 2018-08-26: qty 15

## 2018-08-26 MED ORDER — DEXTROSE 5 % IN WATER (D5W) INTRAVENOUS SOLUTION
0.50 mg/min | INTRAVENOUS | Status: DC
Start: 2018-08-26 — End: 2018-08-28
  Administered 2018-08-26 – 2018-08-27 (×4): 0.5 mg/min via INTRAVENOUS
  Administered 2018-08-28 (×2): 0 mg/min via INTRAVENOUS
  Administered 2018-08-28: 0.5 mg/min via INTRAVENOUS
  Filled 2018-08-26 (×4): qty 9

## 2018-08-26 MED ORDER — DILTIAZEM 5 MG/ML INTRAVENOUS SOLUTION
10.0000 mg | Freq: Once | INTRAVENOUS | Status: DC
Start: 2018-08-26 — End: 2018-08-26
  Filled 2018-08-26: qty 2

## 2018-08-26 MED ORDER — MAGNESIUM SULFATE 500 MG/ML (50 %) INJECTION SOLUTION
2.0000 g | Freq: Once | INTRAVENOUS | Status: AC
Start: 2018-08-26 — End: 2018-08-26
  Administered 2018-08-26: 0 g via INTRAVENOUS
  Administered 2018-08-26: 2 g via INTRAVENOUS
  Filled 2018-08-26: qty 4

## 2018-08-26 MED ORDER — DEXTROSE 5 % IN WATER (D5W) INTRAVENOUS SOLUTION
150.0000 mg | Freq: Once | INTRAVENOUS | Status: AC
Start: 2018-08-26 — End: 2018-08-26
  Administered 2018-08-26: 150 mg via INTRAVENOUS
  Administered 2018-08-26: 0 mg via INTRAVENOUS
  Filled 2018-08-26: qty 3

## 2018-08-26 MED ORDER — DIGOXIN 250 MCG/ML (0.25 MG/ML) INJECTION SOLUTION
250.0000 ug | INTRAMUSCULAR | Status: DC
Start: 2018-08-26 — End: 2018-08-26

## 2018-08-26 MED ORDER — DEXTROSE 5 % IN WATER (D5W) INTRAVENOUS SOLUTION
1.00 mg/min | INTRAVENOUS | Status: AC
Start: 2018-08-26 — End: 2018-08-26
  Administered 2018-08-26: 0 mg/min via INTRAVENOUS
  Administered 2018-08-26: 1 mg/min via INTRAVENOUS
  Filled 2018-08-26: qty 9

## 2018-08-26 MED ADMIN — phenazopyridine 100 mg tablet: ORAL | @ 15:00:00

## 2018-08-26 MED ADMIN — nystatin 100,000 unit/gram topical cream: TOPICAL | @ 21:00:00

## 2018-08-26 MED ADMIN — pantoprazole 40 mg tablet,delayed release: ORAL | @ 06:00:00

## 2018-08-26 MED ADMIN — sodium chloride 0.9 % (flush) injection syringe: @ 06:00:00

## 2018-08-26 MED ADMIN — Medication: ORAL | @ 09:00:00

## 2018-08-26 MED ADMIN — PREMIXED HEMODIALYSATE W/ ADDITIVES: INTRAVENOUS | @ 15:00:00

## 2018-08-26 MED ADMIN — sodium chloride 0.9 % (flush) injection syringe: INTRAVENOUS | @ 13:00:00

## 2018-08-26 MED ADMIN — digoxin 250 mcg (0.25 mg) tablet: ORAL | @ 20:00:00

## 2018-08-26 MED ADMIN — gentamicin 120 mg/100 mL in sodium chloride(iso) intravenous piggyback: INTRAVENOUS | @ 19:00:00

## 2018-08-26 MED ADMIN — lactated Ringers intravenous solution: RESPIRATORY_TRACT | @ 15:00:00 | NDC 00338011704

## 2018-08-26 NOTE — Care Plan (Signed)
END OF SHIFT: A&O. Vitals stable, afebrile. Complaints of severe pain in center of her chest, known to be from a mass, treated with PRN pain medication. Tolerating ABX. Adequate output. No BMs. Chest port flushes, will not draw, changed to phlebotomy round. Ambulating well with stand-by assist. Remains in droplet and neutropenic precautions. No major complaints at this time. Will continue to monitor. Milinda Cave, RN

## 2018-08-26 NOTE — Nurses Notes (Addendum)
Spoke with radiation department.  They state holding off no treatment today and will see about tomorrow.   Marita Snellen, RN

## 2018-08-26 NOTE — Progress Notes (Signed)
[place] [address]  Phone: [phone]  Fax: [phone]   FAMILY MEDICINE INPATIENT PROGRESS NOTE.   Name: Kelsey [name] Date of Birth: 1951-07-04 Age: 68 y.o. Medical Record number : [id number]  Date of Service / LOS  08/26/2018 / 1  Code status: Full Code // Admission diagnosis: febrile neutropenia  Summary   Kelsey [name] is a 68 yo white female with medical history of stage III colon cancer and subsequent stage III lung cancer (started chemoradiation 07/23/18 with last treatment 08/21/18), COPD, A-fib and hyperlipidemia. Patient was admitted for febrile neutropenia.  She was started on IV abx, awaiting blood and urine cultures.  UA shows UTI.  08/26/18 Patient became tachycardic and was determined to be in Afib with RVR.  Dr. Reece Levy was consulted and he placed her on IV amiodarone and held her flecainide.    SUBJECTIVE   Patient was seen at bedside this morning. She reports she is not feeling the best and reports pain in the center of her chest, attributed to her known mass. Pain medication is helping and is being treated with oxycodone and morphine for breakthrough pain. Patient had no other complaints today.   Nurse reports she is ambulating well with assistance. Nurse also reports they are having a hard time being able to draw blood for her labs this morning and that her chest port will flush but will not draw.   All ROS negative except as noted above.     OBJECTIVE   Vital Signs:  Temperature: 36.9 C (98.4 F) BP (Non-Invasive): 122/85 Heart Rate: 86   Respiratory Rate: 20 SpO2: 97 % Weight: 126.1 kg (278 lb 1.6 oz)     I/O:  I/O last 24 hours:      Intake/Output Summary (Last 24 hours) at 08/26/2018 1332  Last data filed at 08/26/2018 1115  Gross per 24 hour   Intake 1580 ml   Output -   Net 1580 ml     I/O current shift:  01/07 0700 - 01/07 1859  In: 110 [I.V.:110]  Out: -   Blood Sugars: No results for input(s): GLUIP in the last 48 hours.    Physical Exam:   GEN: AOx3, no  acute distress, appears stated age.   HEENT: NCAT, EOMI, conjunctivae clear, moist mucosa, no nasal discharge, no cervical lymphadenopathy, trachea midline.    LUNGS: CTAB, no wheezes, no rhonchi, no crackles   HEART: irregularly irregular, s1 and s2 present, no m/r/g   ABDOMEN: soft, non-tender, BSx4, no HSM, left chest port in place  EXTREMITIES: atraumatic, no edema   NEURO: CN 2-12 grossly normal   SKIN: no rashes or lesions   PSYCH: Normal affect and mood, No SI, No HI     LABS and IMAGING   Labs:  I have reviewed all lab results.     CBC Differential   Recent Labs     08/25/18  0933 08/26/18  0727   WBC 1.3* 1.3*   HGB 11.9 10.8*   HCT 35.6 32.2*   PLTCNT 153 137*    Recent Labs     08/25/18  0933 08/26/18  0727   PMNS 57 59   LYMPHOCYTES 26 21   MONOCYTES 15 18   EOSINOPHIL 1 1   BASOPHILS 1  0.00 1  0.00   PMNABS 0.70* 0.80*   LYMPHSABS 0.30* 0.30*   MONOSABS 0.20 0.20   EOSABS 0.00 0.00      BMP LFTs  Recent Labs     08/26/18  0727   SODIUM 138   POTASSIUM 3.5   CHLORIDE 106   CO2 25   BUN 9*   CREATININE 0.67   GLUCOSENF 144*   ANIONGAP 7   BUNCRRATIO 13   GFR >60   CALCIUM 8.2*   MAGNESIUM 1.5*   PHOSPHORUS 2.4*    Recent Labs     08/25/18  1450   UROBILINOGEN 1.0      CoAgs Blood Gas:   Recent Labs     08/26/18  0727   INR 1.62*    No results found for this encounter    Cardiac Markers Lipid Panel   No results for input(s): TROPONINI, CKMB, MBINDEX, BNP in the last 72 hours. No results found for this encounter   Urine Analysis Other Labs   Recent Labs     08/25/18  1450   APPEARANCE Cloudy   COLOR Dark Yellow*   SPECGRAVUR 1.024   GLUCOSE Negative   BILIRUBIN Small*   KETONES Negative   PHURINE 5.5   PROTEIN Negative   UROBILINOGEN 1.0   NITRITE Negative   LEUKOCYTES Negative   RBCS 0-2*   WBCS 0-5*   BACTERIA TNTC*    No results found for this encounter    Invalid input(s): PRL   Imaging:    Results for orders placed or performed during the hospital encounter of 08/25/18 (from the past 24 hour(s))      XR AP MOBILE CHEST     Status: None    Narrative    Kelsey [name]    PROCEDURE DESCRIPTION: XR AP MOBILE CHEST    PROCEDURE PERFORMED DATE AND TIME: 08/25/2018 1:59 PM    CLINICAL INDICATION: neutropenic fever    TECHNIQUE: 1 views / 1 images submitted.    COMPARISON: 08/07/2018      FINDINGS: Portable chest shows right subclavian port remains in place.  Cardiac size is stable. Mass in the right lower lobe is not significantly  changed. Vascularity is normal. There is no acute infiltrate. No  pneumothorax is present.      Impression    Right lower lobe mass has a similar appearance. No acute  infiltrate.        Radiologist location ID: [id number]         Microbiology/Pathology:    No results found for any visits on 08/25/18 (from the past 24 hour(s)).  No results found for this visit on 08/25/18.    CURRENT MEDICATION LIST   albuterol (PROVENTIL) 2.5 mg / 3 mL (0.083%) neb solution, 2.5 mg, Nebulization, Q6H  amiodarone (CORDARONE) 450 mg in D5W 250 mL infusion, 1 mg/min, Intravenous, Continuous  amiodarone (CORDARONE) 450 mg in D5W 250 mL infusion, 0.5 mg/min, Intravenous, Continuous  apixaban (ELIQUIS) tablet, 5 mg, Oral, 2x/day  atorvastatin (LIPITOR) tablet, 10 mg, Oral, Daily  budesonide-formoterol (SYMBICORT) 160 mcg-4.5 mcg per inhalation oral inhaler, 2 Puff, Inhalation, 2x/day  carvedilol (COREG) tablet 18.75 mg, 18.75 mg, Oral, 2x/day  ceFEPime (MAXIPIME) 2g in NS 38mL IVPB minibag, 2 g, Intravenous, Q8H  docusate sodium (COLACE) capsule, 100 mg, Oral, 3x/day PRN  DULoxetine (CYMBALTA) delayed release capsule, 30 mg, Oral, Daily  linaclotide (LINZESS) capsule, 72 mcg, Oral, QAM  magnesium oxide (MAG-OX) tablet, 400 mg, Oral, Daily  morphine 2 mg/mL injection, 2 mg, Intravenous, Q3H PRN  NS flush syringe, 3 mL, Intracatheter, Q8HRS  NS flush syringe, 3 mL, Intracatheter, Q1H PRN  NS premix infusion, , Intravenous, Continuous  nystatin (MYCOSTATIN) 100,000 units per mL oral liquid, 5 mL, Swish & Swallow,  4x/day  oxyCODONE (ROXICODONE) immediate release tablet, 5 mg, Oral, Q6H PRN  pantoprazole (PROTONIX) delayed release tablet, 40 mg, Oral, Daily before Breakfast  phenazopyridine (PYRIDIUM) tablet, 100 mg, Oral, 3x/day-Meals  potassium chloride (K-DUR) extended release tablet, 20 mEq, Oral, Daily  potassium phosphate 22 mmol in NS 500 mL IVPB, 22 mmol, Intravenous, Once  prochlorperazine (COMPAZINE) tablet, 10 mg, Oral, 4x/day PRN        ASSESSMENT and PLAN   Kelsey [name] is a 68 yo white female with medical history of stage III colon cancer and subsequent stage III lung cancer (started chemoradiation 07/23/18 with last treatment 08/21/18), COPD, A-fib and hyperlipidemia who presented from the oncology clinic with fever. Patient was admitted for neutropenic fever.    Active Hospital Problems    Diagnosis Date Noted   . Neutropenic fever (CMS HCC) [D70.9, R50.81] 08/07/2018      Resolved Hospital Problems   No resolved problems to display.     Neutropenic Fever in patient with Stage III Colon cancer now with Stage III lung cancer on Chemoradiation    - ANC 700 on admission, 800 on 08/26/18  - Temp 99.3 on admission, Tmax 100  - Possible causes URI vs UTI (UA showed TNTC bacteria)  - BCx, UCx, Biofire pending  - CXR negative for acute infiltrate or pneumonia  - Continue Cefepime 2G Q8  - Oxycodone IR 5mg  Q6 PRN, Morphine 2mg  Q3 PRN Breakthrough  - Holding chemoradiation  - Oncology, Dr. Deatra Canter, consulted    Atrial Fibrillation with RVR  - Afib on EKG, heart rate 170's  - Started IV amiodarone, holding flecainide per cardiology  - On Eliquis  - Patient of Dr. Reece Levy, consulting    UTI  - UA shows TMTC bacteria, negative leukocytes, negative nitrites  - Start Pyridium   - On cefepime    Adenocarcinoma of lung, Stage III  - Chemoradiation started 07/23/2018, last treatment 08/21/18  - holding chemoradiation  - Patient also has history of stage III colon cancer    COPD  - SOB improved  - CXR showed no acute infiltrate  - Continue  home Symbicort BID, albuterol PRN    Hyperlipidemia  - Continue Lipitor 10mg     Chemo Induced Nausea and Vomiting  - Continue compazine 10mg  4x/day PRN    Chronic Constipation  - Continue home Linzess, colace        DVT/PE prophylaxis - Eliquis  Hardware (lines, drains, foley, tubes) - Left chest port      Anticipated disposition planning.    Dispo: Pending blood and urine cultures, control of HR      Azzie Roup  08/26/2018, 06:54  MS4 [facility].   Case to be reviewed in detail with Dr. Loma Sousa        ______________________________________________________________________________    This note is to be used for educational purposes only.    Teller, DO 08/26/2018, 15:20

## 2018-08-26 NOTE — Nurses Notes (Signed)
Pt admitted to [facility] yesterday. Spoke with [employer] on 6S, pt did not have a fever this morning, did have a fever last night.   No tx today per MAS.  Will recheck on pt tomorrow with possible tx then.  Jimmye Norman, RN

## 2018-08-26 NOTE — Progress Notes (Signed)
[place] [address]     Phone: [phone]  Fax: [place] NOTE     Patient Name: Kelsey [name]  Date of service: 08/26/2018  Date of Admission:  08/25/2018  Hospital Day:  LOS: 1 day     SUMMARY     Kelsey [name] is a 68 y.o. old female, with Stage III adenocarcinoma of lung admitted for febrile neutropenia. She was started on IV abx, awaiting blood and urine cultures. UA shows UTI. While admited she became tachycardic and was determeined to be in Afib with RVR. Dr. Reece Levy consulted and started patient on IV Amiodarone and holding flecainide.     SUBJECTIVE     Patient seen and examined. Patient did well with no overnight events reported. Patient states her pain is better controlled.    Diet - regular  Code Status - Full Code    Review of systems:   Review of Systems   Constitutional: Negative for activity change, chills and fever.   HENT: Negative for congestion, hearing loss, sneezing and sore throat.    Eyes: Negative for pain, discharge and visual disturbance.   Respiratory: Negative for cough, chest tightness, shortness of breath and wheezing.    Cardiovascular: Negative for chest pain, palpitations and leg swelling.   Gastrointestinal: Negative for abdominal distention, abdominal pain, constipation, diarrhea, nausea and vomiting.   Genitourinary: Negative for dysuria, frequency and urgency.   Musculoskeletal: Negative for arthralgias and back pain.        Pain RUQ to RL Thoracic, epigastric    Skin: Negative for rash.   Neurological: Negative for dizziness, syncope, light-headedness and headaches.   Psychiatric/Behavioral: Negative for self-injury, sleep disturbance and suicidal ideas.       OBJECTIVE     Vital Signs:  BP 122/85   Pulse 86   Temp 36.9 C (98.4 F)   Resp 20   Ht 1.575 m (5' 2.01")   Wt 126.1 kg (278 lb 1.6 oz)   SpO2 97%   BMI 50.85 kg/m         Physical Exam:  GEN:  Patient is alert, cooperative, in no  acute distress   HEENT: Normocephalic, atraumatic. Pupils equal, round and reactive. EOMI.  LUNGS: Non labored breathing. Clear to auscultation  CV: Iregularly irreg, no murmur  ABDOMEN: Soft, non-distended  SKIN: Warm and dry  NEURO: Awake, Alert and Oriented  EXTREMITIES:  Atraumatic, no edema or cyanosis     I/O:    Intake/Output Summary (Last 24 hours) at 08/26/2018 1312  Last data filed at 08/26/2018 1115  Gross per 24 hour   Intake 1580 ml   Output -   Net 1580 ml       Blood Sugars:   No results for input(s): GLUIP in the last 48 hours.     LABS & IMAGING     CBC Results    Recent Labs     08/25/18  0933 08/26/18  0727   WBC 1.3* 1.3*   HGB 11.9 10.8*   HCT 35.6 32.2*   PLTCNT 153 137*      BMP Results    Recent Labs     08/25/18  0933 08/26/18  0727   SODIUM 137 138   POTASSIUM 3.9 3.5   CHLORIDE 101 106   CO2 30 25   BUN 13 9*   CREATININE 0.69 0.67   GFR >60 >60  ANIONGAP 6 7      Recent Labs     08/25/18  0933 08/26/18  0727   CALCIUM 9.0 8.2*   ALBUMIN 4.0  --    MAGNESIUM  --  1.5*      Cardiac Results   Recent Labs     08/26/18  1101   UHCEASTTROPI 0.00      Liver/Pancrease Enzyme Results   Recent Labs     08/25/18  0933   TOTALPROTEIN 6.3   ALBUMIN 4.0   AST 26   ALT 31   ALKPHOS 64      Coagulation Studies    Recent Labs     08/26/18  0727   INR 1.62*      Lipid Panel Results    No results found for: CHOLESTEROL, HDLCHOL, LDLCHOL, LDLCHOLDIR, TRIG   Imaging Studies     Results for orders placed or performed during the hospital encounter of 08/25/18 (from the past 24 hour(s))   XR AP MOBILE CHEST     Status: None    Narrative    Ayelet Schmieder    PROCEDURE DESCRIPTION: XR AP MOBILE CHEST    PROCEDURE PERFORMED DATE AND TIME: 08/25/2018 1:59 PM    CLINICAL INDICATION: neutropenic fever    TECHNIQUE: 1 views / 1 images submitted.    COMPARISON: 08/07/2018      FINDINGS: Portable chest shows right subclavian port remains in place.  Cardiac size is stable. Mass in the right lower lobe is not  significantly  changed. Vascularity is normal. There is no acute infiltrate. No  pneumothorax is present.      Impression    Right lower lobe mass has a similar appearance. No acute  infiltrate.        Radiologist location ID: [id number]        Microbiology / Pathology      No results found for any visits on 08/25/18 (from the past 96 hour(s)).  No results found for this visit on 08/25/18.     INPATIENT MEDICATIONS   albuterol (PROVENTIL) 2.5 mg / 3 mL (0.083%) neb solution, 2.5 mg, Nebulization, Q6H  amiodarone (CORDARONE) 150 mg in D5W 100 mL IV bolus, 150 mg, Intravenous, Once  amiodarone (CORDARONE) 450 mg in D5W 250 mL infusion, 1 mg/min, Intravenous, Continuous  amiodarone (CORDARONE) 450 mg in D5W 250 mL infusion, 0.5 mg/min, Intravenous, Continuous  apixaban (ELIQUIS) tablet, 5 mg, Oral, 2x/day  atorvastatin (LIPITOR) tablet, 10 mg, Oral, Daily  budesonide-formoterol (SYMBICORT) 160 mcg-4.5 mcg per inhalation oral inhaler, 2 Puff, Inhalation, 2x/day  carvedilol (COREG) tablet 18.75 mg, 18.75 mg, Oral, 2x/day  ceFEPime (MAXIPIME) 2g in NS 46mL IVPB minibag, 2 g, Intravenous, Q8H  docusate sodium (COLACE) capsule, 100 mg, Oral, 3x/day PRN  DULoxetine (CYMBALTA) delayed release capsule, 30 mg, Oral, Daily  linaclotide (LINZESS) capsule, 72 mcg, Oral, QAM  magnesium oxide (MAG-OX) tablet, 400 mg, Oral, Daily  morphine 2 mg/mL injection, 2 mg, Intravenous, Q3H PRN  NS flush syringe, 3 mL, Intracatheter, Q8HRS  NS flush syringe, 3 mL, Intracatheter, Q1H PRN  NS premix infusion, , Intravenous, Continuous  nystatin (MYCOSTATIN) 100,000 units per mL oral liquid, 5 mL, Swish & Swallow, 4x/day  oxyCODONE (ROXICODONE) immediate release tablet, 5 mg, Oral, Q6H PRN  pantoprazole (PROTONIX) delayed release tablet, 40 mg, Oral, Daily before Breakfast  phenazopyridine (PYRIDIUM) tablet, 100 mg, Oral, 3x/day-Meals  potassium chloride (K-DUR) extended release tablet, 20 mEq, Oral, Daily  potassium phosphate 22 mmol in NS 500 mL  IVPB, 22 mmol, Intravenous, Once  prochlorperazine (COMPAZINE) tablet, 10 mg, Oral, 4x/day PRN        ASSESSMENT & PLAN     Active Hospital Problems    Diagnosis   . Neutropenic fever (CMS HCC)     Neutropenic Fever likely secondary to UTI  - ANC 700->800 on 08/26/18  - Tmax 100  - UA showed TNTC bacteria  - BCx, UCx pending  - CXR showed no acute process   - Cefepime 2G Q8  - Oxycodone IR 5mg  Q6 PRN, Morphine 2mg  Q3 PRN Breakthrough  - Holding chemoradiation  - Oncology, Dr. Deatra Canter, consulted    Atrial Fibrillation with RVR  - HR in 170s, afib on ekg  - Troponin 0.00  - Started on IV Amiodarone, holding flecainide per cardiology  - Patient of Dr. Reece Levy, consulted  - On Eliquis 5mg  BID    Adenocarcinoma of Lung, Stage III  - Started chemoradiation on July 23, 2018, last tx 08/21/18  - holding chemoradiation  - Patient also has history of Stage III Colon cancer     COPD  - SOB improved   - CXR showed no acute infiltrate  - Continue home Symbicort BID, albuterol PRN    Hyperlipidemia  - continue Lipitor 10mg     Chemo Induced Nausea and Vomiting  - continue compazine 10mg  4x/day PRN    Chronic Constipation  - Continue home Linzess, colace    HypoMg/P  - 2G Mg IV, 50mmol KPhos      PT/OT: No  DVT/PE Prophylaxis: Apixaban  DNR Status:  Full Code  Consults: Oncology      Discharge Planning:   Pending blood and urine cultures, control of HR      Lovette Cliche, D.O. 08/26/2018 08:51  PGY-1 [facility]  [address]  [place], [place] [address]  Phone: [phone] / Fax: [phone]        08/26/2018  ________________________________________________________________________    I personally saw and evaluated the patient.  I have discussed the patient's case and management with the family medicine team.   I have reviewed the resident's progress note and agree with the findings and plan of care as documented.  Any exceptions/additions are edited/noted.  Patient  has history of AFib, on Flecainde, Coreg, and Eliquis at home.  Had asymptomatic episode of AFib with RVR this morning, case discussed with Dr. Reece Levy.  Moved to tele floor and started on IV Amiodarone.  Appreciate consultants recommendations.    [place], DO 08/26/2018, [facility]

## 2018-08-26 NOTE — Nurses Notes (Deleted)
Patient refusing BiPAP, expressing concerns that her lips feel numb. Educated patient alongside respiratory therapist of risks and benefits, tried to persuaded to keep on, continued to refuse. Will continue to monitor. Milinda Cave, RN

## 2018-08-26 NOTE — Nurses Notes (Signed)
Patient alert and oriented. Vitals stable. No complaints of SOB or chest pain. PRN pain meds given for chronic pain. Amiodarone drip infusing. Patient is resting in bed, no questions or concerns at this time. Continuing to monitor. Jerlyn Ly, RN

## 2018-08-26 NOTE — Nurses Notes (Addendum)
Heart rate elevated this am 140s.  Pt states it is like this sometimes before having meds.  States she has afib. Rechecked about 2 hours after am meds and still running in the 140s.  Appears to be in afib when checking on vitals machine.  Paged family med on call.   States to get EKG, place on tele and get serial troponins.   Marita Snellen, RN     1100:  Monitor room called stating pt reaching 175 BPM on tele.  Family med reviewing EKG and notified of HR.   Marita Snellen, RN     [phone]:  Notified family medicine that cardizem is unable to be ran on [facility].  Asked if they want pt on stepdown. Order received for stepdown, either 6N or 7N.  Marita Snellen, RN

## 2018-08-26 NOTE — Nurses Notes (Signed)
Potassium phos bolus infusing through port. Spoke with family med do to amiodarone not being compatible with it. Family med stated we could stop the bolus. Jerlyn Ly, RN

## 2018-08-26 NOTE — Nurses Notes (Signed)
Pt c/o burning with urination requesting med to help. (currently on cefepime).  Wanting something for the symptoms like pyridium. Paged FM on call.   Marita Snellen, RN

## 2018-08-26 NOTE — Nurses Notes (Addendum)
Pt back in normal sinus rhythm. Dr. Reece Levy notified. Dr. Reece Levy stated to keep amio drip running.  Jerlyn Ly, RN

## 2018-08-26 NOTE — Consults (Signed)
PATIENT NAME: Kelsey [name], Kelsey [name] NUMBER:  [id number]  DATE OF SERVICE: 08/26/2018  DATE OF BIRTH:  12-26-1950  CONSULTATION    REQUESTING PHYSICIAN:  Antoine Primas, DO    REASON FOR CONSULTATION:  Atrial ablation, rapid heart rate.    HISTORY AND PHYSICAL:  A 68 year old female, known to me from my office.  The patient mainly admitted for fever, found to have sepsis.  The patient does have history of colon cancer and also lung cancer.  The patient developed rapid heart rate, found to have atrial fibrillation with rapid heart rate.  The patient yesterday was in normal sinus rhythm, although there was no documentation, but her heart rate was in the 80s, but today it jumped up to 160 to 170.  Family medicine doctor spoke to me, before I saw her, and I told him to consider putting Cardizem drip, but when I saw her, heart rate was down to 60, she was in atrial fibrillation.  I decided to switch to amiodarone to put her back into sinus rhythm, as she was in normal sinus rhythm in my office.    PAST MEDICAL HISTORY:  1. History of atrial fibrillation, paroxysmal.   2. History of lung cancer.   3. History of COPD.    MEDICATIONS:  1. Eliquis.  2. Atorvastatin.  3. Symbicort.   4. Cardizem.   5. Omeprazole.   6. Potassium.   7. Vitamin D.    IMAGING:  The patient had myocardial perfusion scan in August, 2019 showed no significant ischemia, with EF about 60%.  The patient also had echocardiogram in August 2019, showed normal systolic function, mild to moderate mitral regurgitation.    REVIEW OF SYSTEM:  HEENT:  Showed no hearing problem.  Lungs:  History of COPD.  Cardiovascular:  History of atrial fibrillation.  No MI.  Heart murmur.  Gastrointestinal:  History of acid reflux disease.  No colon cancer.  Central nervous system:  History of dizzy spells.  Vascular:  History of peripheral artery disease.  Musculoskeletal:  History of arthritis.    SOCIAL HISTORY:  She used to smoke.    FAMILY  HISTORY:  History of CAD.    PHYSICAL EXAMINATION:  General:  Elderly female, sick-looking, alert, awake, not in significant distress.  Vital signs:  Heart rate 90 to 100, irregular.  Blood pressure 140/80, respirations 20. HEENT:  Showed no JVD.  Lungs:  Good air entry.  No crackles.  Cardiovascular.  Heart sounds irregular.  She has systolic murmur in the apex.  Abdomen:  Soft, nontender.  Extremities:  No edema.  Central nervous system:  No obvious focal deficit.    LABORATORY AND X-RAY DATA:  White count 1300, hemoglobin 10, platelet count 137,000.  Potassium 3.5, creatinine 0.7, blood sugar 144.  Troponin 0.    IMPRESSION:  1. The patient admitted for fever, possible sepsis.  She was neutropenic.  She is getting chemotherapy for her lung cancer, colon cancer.  2. Consult for atrial fibrillation.  Rate is rapid, irregular, proximal.  She appears to be in sinus yesterday.  She was in sinus in my office.  She is on flecainide.  3. History of chronic obstructive pulmonary disease.   4. History of hypertension.   5. History of colon cancer.  6. History of lung cancer.    PLAN:  Discontinue flecainide.  Discontinue IV Cardizem drip.  We will give IV amiodarone.  Okay to transfer to the floor.  Con Memos, MD                DD:  08/26/2018 17:20:25  DT:  08/26/2018 17:48:29 LB  D#:  [id number]

## 2018-08-26 NOTE — Consults (Signed)
Consult for atrial fibrillation, rapid ventricular response.  Patient does have atrial ablation, paroxysmal she was in sinus when I saw in my office she is on flecainide and Eliquis.  Patient had myocardial scan she recently showed no significant ischemia per she also had echo showed normal systolic function with mild-to-moderate medication    Admitted for fever, sepsis    Neutropenia, white count 1.3 hemoglobin 10.8 platelet count 137    History of stage III colon cancer, stage III lung cancer status post chemotherapy    Plan will hold flecainide will give IV amiodarone as patient appears to be within sinus yesterday.  Patient was in sinus in August in my office.  Patient heart rate was in 70s and 80s yesterday no further cardiac workup from cardiac point of view

## 2018-08-26 NOTE — Nurses Notes (Addendum)
Called report to [place], Therapist, sports on [facility].  Marita Snellen, RN     Dr reddy called regarding consult.  Notified that heart rate has been more controlled reaching about 120bpm at times.  Otherwise  80-100bpm.  He states he will d/c cardizem drip and put in other orders.  Updated receiving RN at the bedside on [facility]. Pt transported without any significant events.  HR about 115 on tele during transport.    Marita Snellen, RN

## 2018-08-26 NOTE — Consults (Signed)
[facility]  Initial Note    [name], Kelsey [name]  Date of Admission:  08/25/2018  Date of Service: 08/26/2018  Date of Birth:  11/13/1950    Requesting MD: Pollyann Glen     Assessment/Plan:  68 y.o. 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    1. Neutropenic fever:  -  likely due to UTI.   urine cultures pending.  -  Agree with IV antibiotics.  -  will give Neupogen      2. Stage III adenocarcinoma lung:  -Currently on chemoradiation.    AFib with RVR:  Management as per primary team and Cardiology.      DVT/PE ProphylaxisApixaban  Disposition Planning: Home discharge           HPI/Discussion: 68 y.o. female with stage III lung cancer currently undergoing chemo radiation admitted to hospital with neutropenic fever.  Patient reports a fever of 103 at home .  The patient rechecked temperature following Tylenol and temperature remained 102.1.  Patient presented for her appointment yesterday to Hematology/Oncology Clinic.   Patient reports green yellow sputum.  Patient reports exertional dyspnea.  Patient denies blurred vision, headaches, joint pain. Patient also report abdominal upset and diarrhea. Otherwise, patient is feeling ill.     REVIEW OF SYSTEMS:  GENERAL:  no fevers, chills.    HEENT: no sore throat, congestion, blurry vision  Lungs: + shortness of breath, cough, wheezes at this time  GI: no nausea, vomiting, diarrhea, constipation  GU: No dysuria, urgency, increased frequency   Cardiac: no chest pains, palpitations, syncope,   Neuro: no weakness, loss of function, numbness, tingling  Skin: no rash  Musculoskeletal: No myalgias  Psychosocial: No depression, anxiety  Hematologic: No easy bruising, abnormal bleeding  All other review ROS negative except those mentioned above.      Past Medical History:   Diagnosis Date   . A-fib (CMS HCC)    . Arthropathy, unspecified,  site unspecified    . Asthma    . Cancer (CMS HCC)     cervical   . Cataract    . Colon cancer (CMS [facility]) 07/06/2014   . COPD (chronic obstructive pulmonary disease) (CMS HCC)    . Fistula    . HTN (hypertension)    . Hyperlipidemia    . Obesity    . Sleep apnea    . Ventral hernia          Past Surgical History:   Procedure Laterality Date   . ABCESS DRAINAGE     . BOWEL RESECTION     . CATARACT EXTRACTION     . HX CERVICAL CONE BIOPSY      . HX CESAREAN SECTION     . HX GASTRIC BYPASS      25 years ago   . [place] (x2)   . HX HYSTERECTOMY      late 1990s   . HX LAP BANDING      2013   . HX LAPAROTOMY      2013 to remove lap band         Medications Prior to Admission     Prescriptions    acetaminophen (TYLENOL) 325 mg Oral Tablet    Take 2 Tabs (650 mg total) by mouth Every 6 hours as needed    Patient not taking:  Reported on 08/25/2018  albuterol sulfate (PROVENTIL) 2.5 mg /3 mL (0.083 %) Inhalation Solution for Nebulization    3 mL (2.5 mg total) by Nebulization route Every 6 hours    apixaban (ELIQUIS) 5 mg Oral Tablet    Take 1 Tab (5 mg total) by mouth Twice daily    atorvastatin (LIPITOR) 10 mg Oral Tablet    Take 10 mg by mouth Once a day    budesonide-formoterol (SYMBICORT) 160-4.5 mcg/actuation Inhalation HFA Aerosol Inhaler    Take 2 Puffs by inhalation Twice daily    CARVEDILOL ORAL    Take 6.25 mg by mouth Twice daily 3 tablets    cephalexin (KEFLEX) 500 mg Oral Capsule    Take 1 Cap (500 mg total) by mouth Three times a day    Patient not taking:  Reported on 08/25/2018    docusate sodium (COLACE) 100 mg Oral Capsule    Take 100 mg by mouth Three times a day as needed     DULoxetine (CYMBALTA DR) 30 mg Oral Capsule, Delayed Release(E.C.)    Take 30 mg by mouth Once a day    flecainide (TAMBOCOR) 50 mg Oral Tablet    Take 1 Tab (50 mg total) by mouth Twice daily    linaCLOtide (LINZESS) 72 mcg Oral Capsule    Take 72 mcg by mouth Every morning    loperamide (IMODIUM) 2 mg Oral  Capsule    Take 2 mg by mouth Every 4 hours as needed    magnesium oxide (MAG-OX) 400 mg Oral Tablet    Take 400 mg by mouth Once a day    omeprazole (PRILOSEC) 20 mg Oral Capsule, Delayed Release(E.C.)    Take 40 mg by mouth Once a day     potassium chloride (K-DUR) 20 mEq Oral Tab Sust.Rel. Particle/Crystal    Take 20 mEq by mouth Once a day    prochlorperazine (COMPAZINE) 10 mg Oral Tablet    Take 1 Tab (10 mg total) by mouth Four times a day as needed for nausea/vomiting    rOPINIRole (REQUIP) 0.5 mg Oral Tablet    Take 0.5 mg by mouth Every night        albuterol (PROVENTIL) 2.5 mg / 3 mL (0.083%) neb solution, 2.5 mg, Nebulization, Q6H  amiodarone (CORDARONE) 450 mg in D5W 250 mL infusion, 1 mg/min, Intravenous, Continuous  amiodarone (CORDARONE) 450 mg in D5W 250 mL infusion, 0.5 mg/min, Intravenous, Continuous  apixaban (ELIQUIS) tablet, 5 mg, Oral, 2x/day  atorvastatin (LIPITOR) tablet, 10 mg, Oral, Daily  budesonide-formoterol (SYMBICORT) 160 mcg-4.5 mcg per inhalation oral inhaler, 2 Puff, Inhalation, 2x/day  carvedilol (COREG) tablet 18.75 mg, 18.75 mg, Oral, 2x/day  ceFEPime (MAXIPIME) 2g in NS 10mL IVPB minibag, 2 g, Intravenous, Q8H  docusate sodium (COLACE) capsule, 100 mg, Oral, 3x/day PRN  DULoxetine (CYMBALTA) delayed release capsule, 30 mg, Oral, Daily  linaclotide (LINZESS) capsule, 72 mcg, Oral, QAM  magnesium oxide (MAG-OX) tablet, 400 mg, Oral, Daily  morphine 2 mg/mL injection, 2 mg, Intravenous, Q3H PRN  NS flush syringe, 3 mL, Intracatheter, Q8HRS  NS flush syringe, 3 mL, Intracatheter, Q1H PRN  NS premix infusion, , Intravenous, Continuous  nystatin (MYCOSTATIN) 100,000 units per mL oral liquid, 5 mL, Swish & Swallow, 4x/day  oxyCODONE (ROXICODONE) immediate release tablet, 5 mg, Oral, Q6H PRN  pantoprazole (PROTONIX) delayed release tablet, 40 mg, Oral, Daily before Breakfast  phenazopyridine (PYRIDIUM) tablet, 100 mg, Oral, 3x/day-Meals  potassium chloride (K-DUR) extended release  tablet, 20 mEq, Oral, Daily  prochlorperazine (  COMPAZINE) tablet, 10 mg, Oral, 4x/day PRN      Allergies   Allergen Reactions   . Shellfish Containing Products Shortness of Breath, Rash and Itching     scallops   . Vancomycin Shortness of Breath   . Barium Iodide    . Iodine And Iodide Containing Products      Itching, shortness of breath      Family History  Family Medical History:     Problem Relation (Age of Onset)    Diabetes Mother, Maternal Grandmother, Maternal Grandfather, Father    Heart Attack Mother    Stroke Father          Social History  Social History     Socioeconomic History   . Marital status: Married     Spouse name: Not on file   . Number of children: Not on file   . Years of education: Not on file   . Highest education level: Not on file   Occupational History   . Not on file   Social Needs   . Financial resource strain: Not on file   . Food insecurity:     Worry: Not on file     Inability: Not on file   . Transportation needs:     Medical: Not on file     Non-medical: Not on file   Tobacco Use   . Smoking status: Former Research scientist (life sciences)   . Smokeless tobacco: Never Used   . Tobacco comment: quit 10 yrs ago   Substance and Sexual Activity   . Alcohol use: Not Currently     Comment: rarely   . Drug use: No   . Sexual activity: Not on file   Lifestyle   . Physical activity:     Days per week: Not on file     Minutes per session: Not on file   . Stress: Not on file   Relationships   . Social connections:     Talks on phone: Not on file     Gets together: Not on file     Attends religious service: Not on file     Active member of club or organization: Not on file     Attends meetings of clubs or organizations: Not on file     Relationship status: Not on file   . Intimate partner violence:     Fear of current or ex partner: Not on file     Emotionally abused: Not on file     Physically abused: Not on file     Forced sexual activity: Not on file   Other Topics Concern   . Ability to Walk 1 Flight of Steps  without SOB/CP No   . Routine Exercise Not Asked   . Ability to Walk 2 Flight of Steps without SOB/CP Not Asked   . Unable to Ambulate Not Asked   . Total Care Not Asked   . Ability To Do Own ADL's Yes   . Uses Walker Not Asked   . Other Activity Level Not Asked   . Uses Cane Not Asked   Social History Narrative   . Not on file       Objective     EXAM  VITALS:    Temperature: 37 C (98.6 F)  Heart Rate: 80  BP (Non-Invasive): 105/74  Respiratory Rate: 20  SpO2: 97 %  Pain Score (Numeric, Faces): 10(RN notified )  Constitutional:  appears chronically ill  Neck:  no thyromegaly  or lymphadenopathy  Respiratory:  Clear to auscultation bilaterally.   Cardiovascular:  regular rate and rhythm  Gastrointestinal:  Soft, non-tender  Musculoskeletal:  Head atraumatic and normocephalic  Neurologic:  Grossly normal  Lymphatic/Immunologic/Hematologic:  No lymphadenopathy    IMAGES:    XR AP MOBILE CHEST   Final Result   Right lower lobe mass has a similar appearance. No acute   infiltrate.            Radiologist location ID: [id number]               LABS:    I have reviewed all lab results.  Lab Results Today:    Results for orders placed or performed during the hospital encounter of 08/25/18 (from the past 24 hour(s))   RESPIRATORY VIRUS PANEL BY BIOFIRE FILM ARRAY   Result Value Ref Range    ADENOVIRUS ARRAY Target Not Detected Target Not Detected    CORONAVIRUS 229E Target Not Detected Target Not Detected    CORONAVIRUS HKU1 Target Not Detected Target Not Detected    CORONAVIRUS NL63 Target Not Detected Target Not Detected    CORONAVIRUS OC43 Target Not Detected Target Not Detected    METAPNEUMOVIRUS ARRAY Target Not Detected Target Not Detected    RHINOVIRUS/ENTEROVIRUS ARRAY Target Not Detected Target Not Detected    INFLUENZA A Target Not Detected Target Not Detected    INFLUENZA B ARRAY Target Not Detected Target Not Detected    PARAINFLUENZA 1 ARRAY Target Not Detected Target Not Detected    PARAINFLUENZA 2 ARRAY Target  Not Detected Target Not Detected    PARAINFLUENZA 3 ARRAY Target Not Detected Target Not Detected    PARAINFLUENZA 4 ARRAY Target Not Detected Target Not Detected    RSV ARRAY Target Not Detected Target Not Detected    BORDETELLA PERTUSSIS ARRAY Target Not Detected Target Not Detected    CHLAMYDOPHILA PNEUMONIAE ARRAY Target Not Detected Target Not Detected    MYCOPLASMA PNEUMONIAE ARRAY Target Not Detected Target Not Detected   BASIC METABOLIC PANEL, NON-FASTING   Result Value Ref Range    SODIUM 138 135 - 145 mmol/L    POTASSIUM 3.5 3.5 - 5.0 mmol/L    CHLORIDE 106 98 - 111 mmol/L    CO2 TOTAL 25 21 - 35 mmol/L    ANION GAP 7 mmol/L    CALCIUM 8.2 (L) 8.8 - 10.3 mg/dL    GLUCOSE 144 (H) 70 - 110 mg/dL    BUN 9 (L) 10 - 25 mg/dL    CREATININE 0.67 <=1.30 mg/dL    BUN/CREA RATIO 13     ESTIMATED GFR >60 Avg: 85 mL/min/1.36m^2   HGA1C (HEMOGLOBIN A1C WITH EST AVG GLUCOSE)   Result Value Ref Range    HEMOGLOBIN A1C 6.6 (H) 4.1 - 5.7 %    ESTIMATED AVERAGE GLUCOSE 143 mg/dL   MAGNESIUM   Result Value Ref Range    MAGNESIUM 1.5 (L) 1.8 - 2.3 mg/dL   PHOSPHORUS   Result Value Ref Range    PHOSPHORUS 2.4 (L) 2.5 - 4.5 mg/dL   PT/INR   Result Value Ref Range    INR 1.62 (H) 0.80 - 1.10   CBC WITH DIFF   Result Value Ref Range    WBC 1.3 (L) 3.5 - 10.3 x10^3/uL    RBC 4.06 3.80 - 5.24 x10^6/uL    HGB 10.8 (L) 11.8 - 15.8 g/dL    HCT 32.2 (L) 34.6 - 46.2 %    MCV 79.5 (L) 82.3 -  96.7 fL    MCH 26.6 (L) 27.6 - 33.2 pg    MCHC 33.4 32.6 - 35.4 g/dL    RDW 15.6 (H) 12.4 - 15.2 %    PLATELETS 137 (L) 140 - 440 x10^3/uL    MPV 8.5 6.6 - 10.2 fL    NEUTROPHIL % 59 %    LYMPHOCYTE % 21 %    MONOCYTE % 18 %    EOSINOPHIL % 1 %    BASOPHIL % 1 %    NEUTROPHIL # 0.80 (L) 1.50 - 6.40 x10^3/uL    LYMPHOCYTE # 0.30 (L) 0.90 - 3.40 x10^3/uL    MONOCYTE # 0.20 0.20 - 0.90 x10^3/uL    EOSINOPHIL # 0.00 0.00 - 0.50 x10^3/uL    BASOPHIL # 0.00 0.00 - 0.20 x10^3/uL   SCAN DIFFERENTIAL   Result Value Ref Range    PLATELET ESTIMATE Slightly  Decreased     ANISOCYTOSIS 1+     MICROCYTOSIS 1+     WBC MORPHOLOGY COMMENT Normal    ECG 12 LEAD - ADULT   Result Value Ref Range    Heart Rate 156 BPM    PR Interval Invalid ms    QRS Duration 87 ms    QT Interval 266 ms    QTC Calculation 429 ms    Calculated P Axis Invalid deg    QRS Axis 56 deg    Calculated T Axis 202 deg    I 40 Axis 17 deg    T 40 Axis 86 deg    ST Axis 238 deg    EKG Severity - ABNORMAL ECG -    TROPONIN-I   Result Value Ref Range    TROPONIN I 0.00 <=0.04 ng/mL         Vanetta Mulders, MD

## 2018-08-26 NOTE — Care Plan (Signed)
Problem: Adult Inpatient Plan of Care  Goal: Plan of Care Review  Outcome: Ongoing (see interventions/notes)  Goal: Patient-Specific Goal (Individualization)  Outcome: Ongoing (see interventions/notes)  Goal: Absence of Hospital-Acquired Illness or Injury  Outcome: Ongoing (see interventions/notes)  Goal: Optimal Comfort and Wellbeing  Outcome: Ongoing (see interventions/notes)  Goal: Rounds/Family Conference  Outcome: Ongoing (see interventions/notes)     Problem: Infection  Goal: Infection Symptom Resolution  Outcome: Ongoing (see interventions/notes)     Problem: Fever (Fever with Neutropenia)  Goal: Baseline Body Temperature  Outcome: Ongoing (see interventions/notes)     Problem: Infection Risk (Fever with Neutropenia)  Goal: Absence of Infection  Outcome: Ongoing (see interventions/notes)     Problem: Fall Injury Risk  Goal: Absence of Fall and Fall-Related Injury  Outcome: Ongoing (see interventions/notes)

## 2018-08-26 NOTE — Nurses Notes (Signed)
Patient from 6s to 31. Jerlyn Ly, RN

## 2018-08-27 ENCOUNTER — Ambulatory Visit (HOSPITAL_COMMUNITY): Payer: Medicare Other

## 2018-08-27 LAB — ECG 12 LEAD - ADULT
Calculated P Axis: 71 deg
Calculated T Axis: 174 deg
EKG Severity: ABNORMAL
Heart Rate: 73 {beats}/min
I 40 Axis: 30 deg
PR Interval: 169 ms
QRS Axis: 51 deg
QRS Duration: 109 ms
QT Interval: 430 ms
QTC Calculation: 474 ms
ST Axis: 176 deg
T 40 Axis: 93 deg

## 2018-08-27 LAB — CBC WITH DIFF
BASOPHIL #: 0 x10ˆ3/uL (ref 0.00–0.20)
BASOPHIL %: 0 %
EOSINOPHIL #: 0 x10ˆ3/uL (ref 0.00–0.50)
EOSINOPHIL %: 1 %
HCT: 27.1 % — ABNORMAL LOW (ref 34.6–46.2)
HGB: 8.9 g/dL — ABNORMAL LOW (ref 11.8–15.8)
LYMPHOCYTE #: 0.5 10*3/uL — ABNORMAL LOW (ref 0.90–3.40)
LYMPHOCYTE %: 19 %
MCH: 26.6 pg — ABNORMAL LOW (ref 27.6–33.2)
MCHC: 33 g/dL (ref 32.6–35.4)
MCV: 80.4 fL — ABNORMAL LOW (ref 82.3–96.7)
MONOCYTE #: 0.5 x10ˆ3/uL (ref 0.20–0.90)
MONOCYTE %: 19 %
MPV: 8.4 fL (ref 6.6–10.2)
NEUTROPHIL #: 1.6 x10ˆ3/uL (ref 1.50–6.40)
NEUTROPHIL %: 62 %
PLATELETS: 122 10*3/uL — ABNORMAL LOW (ref 140–440)
RBC: 3.37 x10ˆ6/uL — ABNORMAL LOW (ref 3.80–5.24)
RDW: 15.5 % — ABNORMAL HIGH (ref 12.4–15.2)
WBC: 2.5 10*3/uL — ABNORMAL LOW (ref 3.5–10.3)

## 2018-08-27 LAB — BASIC METABOLIC PANEL
ANION GAP: 3 mmol/L
BUN/CREA RATIO: 11
BUN: 8 mg/dL — ABNORMAL LOW (ref 10–25)
CALCIUM: 7.8 mg/dL — ABNORMAL LOW (ref 8.8–10.3)
CHLORIDE: 109 mmol/L (ref 98–111)
CO2 TOTAL: 25 mmol/L (ref 21–35)
CREATININE: 0.74 mg/dL (ref <=1.30)
ESTIMATED GFR: >60 mL/min/1.73mˆ2
GLUCOSE: 115 mg/dL — ABNORMAL HIGH (ref 70–110)
POTASSIUM: 3.5 mmol/L (ref 3.5–5.0)
SODIUM: 137 mmol/L (ref 135–145)

## 2018-08-27 LAB — MANUAL DIFFERENTIAL
BAND %: 17 % — ABNORMAL HIGH (ref 0–6)
LYMPHOCYTE %: 19 % — ABNORMAL LOW (ref 20–40)
LYMPHOCYTE ABSOLUTE: 0.48 x10ˆ3/uL
MONOCYTE %: 13 % — ABNORMAL HIGH (ref 2–11)
MONOCYTE ABSOLUTE: 0.33 x10ˆ3/uL
NEUTROPHIL %: 51 % (ref 50–70)
NEUTROPHIL ABSOLUTE: 1.7 x10ˆ3/uL
PLATELET ESTIMATE: DECREASED
WBC MORPHOLOGY COMMENT: NORMAL
WBC: 2.5 x10ˆ3/uL

## 2018-08-27 LAB — PHOSPHORUS: PHOSPHORUS: 3.4 mg/dL (ref 2.5–4.5)

## 2018-08-27 LAB — MAGNESIUM: MAGNESIUM: 1.9 mg/dL (ref 1.8–2.3)

## 2018-08-27 LAB — TROPONIN-I: TROPONIN I: 0.01 ng/mL (ref <=0.04)

## 2018-08-27 MED ORDER — HYDROMORPHONE (PF) 0.5 MG/0.5 ML INJECTION SYRINGE
0.50 mg | INJECTION | INTRAMUSCULAR | Status: AC
Start: 2018-08-28 — End: 2018-08-27
  Administered 2018-08-27: 0.5 mg via INTRAVENOUS
  Filled 2018-08-27: qty 0.5

## 2018-08-27 MED ADMIN — ZZ EXTEMPORANEOUS TEMPLATE: INTRAVENOUS

## 2018-08-27 MED ADMIN — pantoprazole 40 mg tablet,delayed release: ORAL | @ 05:00:00

## 2018-08-27 MED ADMIN — apixaban 5 mg tablet: ORAL | @ 08:00:00

## 2018-08-27 MED ADMIN — sodium chloride 0.9 % (flush) injection syringe: @ 06:00:00

## 2018-08-27 MED ADMIN — sodium chloride 0.9 % (flush) injection syringe: ORAL | @ 04:00:00

## 2018-08-27 MED ADMIN — enoxaparin 40 mg/0.4 mL subcutaneous syringe: RESPIRATORY_TRACT | @ 20:00:00

## 2018-08-27 MED ADMIN — POTASSIUM PHOSPHATE IN 250ML IVPB: INTRAVENOUS | @ 05:00:00

## 2018-08-27 NOTE — Progress Notes (Signed)
[place] [address]     Phone: [phone]  Fax: [facility] NOTE     Patient Name: Kelsey [name]  Date of service: 08/27/2018  Date of Admission:  08/25/2018  Hospital Day:  LOS: 2 days     SUMMARY     Kelsey [name] is a 67 y.o. old female, with Stage III adenocarcinoma of lung admitted for febrile neutropenia. She was started on IV abx, awaiting blood and urine cultures. UA shows UTI. While admited she became tachycardic and was determeined to be in Afib with RVR. Dr. Reece Levy consulted and started patient on IV Amiodarone and holding flecainide.     SUBJECTIVE     Patient seen and examined. Patient did well with no overnight events reported. Patient states her pain is well controlled. She feels much better.     Diet - regular  Code Status - Full Code    Review of systems:   Review of Systems   Constitutional: Negative for activity change, chills and fever.   HENT: Negative for congestion, hearing loss, sneezing and sore throat.    Eyes: Negative for pain, discharge and visual disturbance.   Respiratory: Negative for cough, chest tightness, shortness of breath and wheezing.    Cardiovascular: Negative for chest pain, palpitations and leg swelling.   Gastrointestinal: Negative for abdominal distention, abdominal pain, constipation, diarrhea, nausea and vomiting.   Genitourinary: Negative for dysuria, frequency and urgency.   Musculoskeletal: Negative for arthralgias and back pain.        Pain RUQ to RL Thoracic, epigastric    Skin: Negative for rash.   Neurological: Negative for dizziness, syncope, light-headedness and headaches.   Psychiatric/Behavioral: Negative for self-injury, sleep disturbance and suicidal ideas.       OBJECTIVE     Vital Signs:  BP 117/64   Pulse 75   Temp 36.8 C (98.2 F)   Resp 18   Ht 1.575 m (5' 2.01")   Wt 126.4 kg (278 lb 9.6 oz)   SpO2 96%   BMI 50.94 kg/m         Physical Exam:  GEN:  Patient is  alert, cooperative, in no acute distress   HEENT: Normocephalic, atraumatic. Pupils equal, round and reactive. EOMI.  LUNGS: Non labored breathing. Clear to auscultation  CV: regular rhythm , no murmur  ABDOMEN: Soft, non-distended  SKIN: Warm and dry  NEURO: Awake, Alert and Oriented  EXTREMITIES:  Atraumatic, no edema or cyanosis     I/O:    Intake/Output Summary (Last 24 hours) at 08/27/2018 0812  Last data filed at 08/26/2018 1900  Gross per 24 hour   Intake 1370 ml   Output -   Net 1370 ml       Blood Sugars:   No results for input(s): GLUIP in the last 48 hours.     LABS & IMAGING     CBC Results    Recent Labs     08/25/18  0933 08/26/18  0727 08/27/18  0547   WBC 1.3* 1.3* 2.5*  2.5   HGB 11.9 10.8* 8.9*   HCT 35.6 32.2* 27.1*   PLTCNT 153 137* 122*   BANDS  --   --  17*      BMP Results    Recent Labs     08/25/18  0933 08/26/18  0727 08/27/18  0547   SODIUM 137 138 137  POTASSIUM 3.9 3.5 3.5   CHLORIDE 101 106 109   CO2 30 25 25    BUN 13 9* 8*   CREATININE 0.69 0.67 0.74   GFR >60 >60 >60   ANIONGAP 6 7 3       Recent Labs     08/25/18  0933 08/26/18  0727 08/27/18  0547   CALCIUM 9.0 8.2* 7.8*   ALBUMIN 4.0  --   --    MAGNESIUM  --  1.5* 1.9      Cardiac Results   Recent Labs     08/26/18  1101 08/26/18  1811 08/27/18  0154   UHCEASTTROPI 0.00 0.01 0.01      Liver/Pancrease Enzyme Results   Recent Labs     08/25/18  0933   TOTALPROTEIN 6.3   ALBUMIN 4.0   AST 26   ALT 31   ALKPHOS 64      Coagulation Studies    Recent Labs     08/26/18  0727   INR 1.62*      Lipid Panel Results    No results found for: CHOLESTEROL, HDLCHOL, LDLCHOL, LDLCHOLDIR, TRIG   Imaging Studies         Microbiology / Pathology      Hospital Encounter on 08/25/18 (from the past 96 hour(s))   ADULT ROUTINE BLOOD CULTURE, SET OF 2 BOTTLES (BACTERIA AND YEAST)    Collection Time: 08/25/18  1:57 PM   Culture Result Status    BLOOD CULTURE, ROUTINE No Growth 18-24 hrs. Preliminary   ADULT ROUTINE BLOOD CULTURE, SET OF 2 BOTTLES (BACTERIA  AND YEAST)    Collection Time: 08/25/18  2:40 PM   Culture Result Status    BLOOD CULTURE, ROUTINE No Growth 18-24 hrs. Preliminary   RESPIRATORY VIRUS PANEL BY BIOFIRE FILM ARRAY    Collection Time: 08/25/18  5:44 PM   Culture Result Status    ADENOVIRUS ARRAY Target Not Detected Final    CORONAVIRUS 229E Target Not Detected Final    CORONAVIRUS HKU1 Target Not Detected Final    CORONAVIRUS NL63 Target Not Detected Final    CORONAVIRUS OC43 Target Not Detected Final    METAPNEUMOVIRUS ARRAY Target Not Detected Final    RHINOVIRUS/ENTEROVIRUS ARRAY Target Not Detected Final    INFLUENZA A Target Not Detected Final    INFLUENZA B ARRAY Target Not Detected Final    PARAINFLUENZA 1 ARRAY Target Not Detected Final    PARAINFLUENZA 2 ARRAY Target Not Detected Final    PARAINFLUENZA 3 ARRAY Target Not Detected Final    PARAINFLUENZA 4 ARRAY Target Not Detected Final    RSV ARRAY Target Not Detected Final    BORDETELLA PERTUSSIS ARRAY Target Not Detected Final    CHLAMYDOPHILA PNEUMONIAE ARRAY Target Not Detected Final    MYCOPLASMA PNEUMONIAE ARRAY Target Not Detected Final    Narrative    Performed by Film Array PCR using the Biofire Respiratory Panel. The results of this test should not be used as the sole basis for diagnosis, treatment, or other management decisions.   Test developed and performance on this sample type validated at [facility] using an FDA cleared assay.       No results found for this visit on 08/25/18.     INPATIENT MEDICATIONS   albuterol (PROVENTIL) 2.5 mg / 3 mL (0.083%) neb solution, 2.5 mg, Nebulization, Q6H  amiodarone (CORDARONE) 450 mg in D5W 250 mL infusion, 0.5 mg/min, Intravenous, Continuous  apixaban (ELIQUIS) tablet, 5 mg, Oral, 2x/day  atorvastatin (LIPITOR) tablet, 10 mg, Oral, Daily  budesonide-formoterol (SYMBICORT) 160 mcg-4.5 mcg per inhalation oral inhaler, 2 Puff, Inhalation, 2x/day  carvedilol (COREG) tablet 18.75 mg, 18.75 mg, Oral, 2x/day  ceFEPime (MAXIPIME) 2g in NS 76mL IVPB  minibag, 2 g, Intravenous, Q8H  docusate sodium (COLACE) capsule, 100 mg, Oral, 3x/day PRN  DULoxetine (CYMBALTA) delayed release capsule, 30 mg, Oral, Daily  linaclotide (LINZESS) capsule, 72 mcg, Oral, QAM  magnesium oxide (MAG-OX) tablet, 400 mg, Oral, Daily  morphine 2 mg/mL injection, 2 mg, Intravenous, Q3H PRN  NS flush syringe, 3 mL, Intracatheter, Q8HRS  NS flush syringe, 3 mL, Intracatheter, Q1H PRN  NS premix infusion, , Intravenous, Continuous  nystatin (MYCOSTATIN) 100,000 units per mL oral liquid, 5 mL, Swish & Swallow, 4x/day  nystatin (MYCOSTATIN) 100,000 units/g topical cream, , Apply Topically, 2x/day  oxyCODONE (ROXICODONE) immediate release tablet, 5 mg, Oral, Q6H PRN  pantoprazole (PROTONIX) delayed release tablet, 40 mg, Oral, Daily before Breakfast  phenazopyridine (PYRIDIUM) tablet, 100 mg, Oral, 3x/day-Meals  potassium chloride (K-DUR) extended release tablet, 20 mEq, Oral, Daily  prochlorperazine (COMPAZINE) tablet, 10 mg, Oral, 4x/day PRN        ASSESSMENT & PLAN     Active Hospital Problems    Diagnosis   . Neutropenic fever (CMS HCC)     Neutropenic Fever secondary to UTI  - ANC 700->800->1700 on 08/27/18  - received 1 dose neupogen 08/26/18  - Afebrile   - UA showed TNTC bacteria  - BCx NG 18-24 hours, UCx pending  - CXR negative, biofire negative  - Cefepime 2G Q8 day 2  - Oxycodone IR 5mg  Q6 PRN, Morphine 2mg  Q3 PRN Breakthrough  - Holding chemoradiation  - Oncology, Dr. Deatra Canter, consulted, recommended neupogen    Atrial Fibrillation with RVR  - Hr well controlled in 70s-80s  - Troponin negative   - IV Amiodarone, holding flecainide per cardiology  - Cardiology, Dr. Reece Levy, following   - On Eliquis 5mg  BID    Adenocarcinoma of Lung, Stage III  - Lung adenocarcinoma positive for CK 7 and TTF 1, negative for P 63 and CK 20  - Started chemoradiation on July 23, 2018, last tx 08/21/18  - Chemo regimen Paclitaxel/Carboplatin  - Holding chemoradiation  - Patient also has history of stage IIIB, T3,  N1a, M0, low-grade  sigmoid cancer in 2015    COPD  - SOB improved   - CXR showed no acute infiltrate  - Continue home Symbicort BID, albuterol PRN    Hyperlipidemia  - Continue Lipitor 10mg     Chemo Induced Nausea and Vomiting  - Continue compazine 10mg  4x/day PRN    Chronic Constipation  - Continue home Linzess, colace    HypoMg/P  - resolved      PT/OT: No  DVT/PE Prophylaxis: Apixaban  DNR Status:  Full Code  Consults: Oncology      Discharge Planning:   Home pending urine cultures so can tailor home antibiotics, control of HR       Lovette Cliche, D.O. 08/27/2018 06:12  PGY-[address] [place]  [address]  [place], [place] [address]  Phone: [phone] / Fax: [phone]          08/27/2018  ________________________________________________________________________    I personally saw and evaluated the patient.  I have discussed the patient's case and management with the family medicine team.   I have reviewed the resident's progress note and agree with the findings and  plan of care as documented.  Any exceptions/additions are edited/noted.  Continue antibiotics for UTI, awaiting culture results.  Amiodarone for AFib per Dr. Reece Levy.  Appreciate consultants care and recommendations.    [place], [place] 08/27/2018, 15:04  Faculty Physician  [facility]  Family Medicine Residency

## 2018-08-27 NOTE — Care Plan (Signed)
Patient alert and oriented throughout shift.  Patient free of falls.  Vital signs stable.  Patient denies any chest pain or shortness of breath.   IV fluids continued.  Amiodarone infusion continued, per orders.  PRN medication given for pain.  Patient continues to work towards discharge goals.       BP (!) 129/56   Pulse 78   Temp 36.8 C (98.2 F)   Resp 20   Ht 1.575 m (5' 2.01")   Wt 126.4 kg (278 lb 9.6 oz)   SpO2 97%   BMI 50.94 kg/m     Dorita Fray, RN  08/27/2018, 17:15      Problem: Adult Inpatient Plan of Care  Goal: Plan of Care Review  Flowsheets (Taken 08/27/2018 0800)  Plan of Care Reviewed With: patient  Goal: Absence of Hospital-Acquired Illness or Injury  Intervention: Identify and Manage Fall Risk  Flowsheets (Taken 08/27/2018 1500 by Retia Passe, PCA)  Safety Promotion/Fall Prevention: safety round/check completed;activity supervised;fall prevention program maintained  Intervention: Prevent VTE (venous thromboembolism)  Flowsheets (Taken 08/27/2018 1100 by Retia Passe, PCA)  VTE Prevention/Management: ambulation promoted  Goal: Optimal Comfort and Wellbeing  Intervention: Provide Person-Centered Care  Flowsheets (Taken 08/27/2018 1532)  Trust Relationship/Rapport: care explained;questions answered;questions encouraged     Problem: Infection  Goal: Infection Symptom Resolution  Intervention: Prevent or Manage Infection  Flowsheets  Taken 08/26/2018 0910 by Marita Snellen, RN  Fever Reduction/Comfort Measures: lightweight bedding;lightweight clothing  Taken 08/26/2018 2000 by Loren Racer, RN  Isolation Precautions: droplet precautions maintained;protective environment precautions maintained  Taken 08/26/2018 1630 by Jerlyn Ly, RN  Infection Management: aseptic technique maintained     Problem: Infection Risk (Fever with Neutropenia)  Goal: Absence of Infection  Intervention: Prevent Infection and Maximize Resistance  Flowsheets (Taken 08/26/2018 2000 by Loren Racer, RN)  Infection  Prevention: personal protective equipment utilized     Problem: Fall Injury Risk  Goal: Absence of Fall and Fall-Related Injury  Intervention: Identify and Manage Contributors to Fall Injury Risk  Flowsheets  Taken 08/25/2018 2035 by Milinda Cave, RN  Self-Care Promotion: independence encouraged;BADL personal objects within reach  Taken 08/27/2018 1532 by Dorita Fray, RN  Medication Review/Management: medications reviewed;high risk medications identified  Intervention: Promote Copywriter, advertising (Taken 08/27/2018 1500 by Retia Passe, PCA)  Safety Promotion/Fall Prevention: safety round/check completed;activity supervised;fall prevention program maintained  Environmental Safety Modification: assistive device/personal items within reach;clutter free environment maintained;lighting adjusted

## 2018-08-27 NOTE — Consults (Signed)
[facility]  Cardiology   Progress Note      Kelsey [name], Kelsey [name]  Date of Admission:  08/25/2018  Date of service: 08/27/2018  Date of Birth:  1950-10-17  MRN:  [id number]  Hospital Day:  LOS: 2 days     Subjective:     Patient is feeling better per she sitting up in a chair per she thinks she still weak but just walking within the room denies any chest pain, short of breath, PND, orthopnea, leg swelling.  Patient says she cannot eat extreme cold are extreme heart food.  Patient received radiation  Objective:     Vital Signs:  Filed Vitals:    08/26/18 1800 08/26/18 2005 08/26/18 2300 08/27/18 0245   BP: (!) 133/95 136/90 127/66 117/64   Pulse: 80 76 73 75   Resp:  16 18 18    Temp:  37.1 C (98.8 F) 36.3 C (97.4 F) 36.8 C (98.2 F)   SpO2: 99% 95% 95% 96%       Physical Exam:    General: Patient not in any acute distress.   HEENT: No JVD, No carotid bruit b/l  Lungs: Clear to auscultation and percussion.  Cardiovascular: Heart sounds are regular, systolic murmur.  Apex  Extremities: No edema, good peripheral pulses.  Neurology: Alert, awake and oriented x3     Telemetry reviewed:  Normal rhythm 70 80    Labs:  No results found for: CHOLESTEROL, HDLCHOL, LDLCHOL, LDLCHOLDIR, TRIG  CBC Results   Recent Labs     08/25/18  0933 08/26/18  0727   WBC 1.3* 1.3*   HGB 11.9 10.8*   HCT 35.6 32.2*   PLTCNT 153 137*      BMP Results   Recent Labs     08/25/18  0933 08/26/18  0727   SODIUM 137 138   POTASSIUM 3.9 3.5   CHLORIDE 101 106   CO2 30 25   BUN 13 9*   CREATININE 0.69 0.67   GFR >60 >60   ANIONGAP 6 7     Recent Labs     08/25/18  0933 08/26/18  0727   CALCIUM 9.0 8.2*   ALBUMIN 4.0  --    MAGNESIUM  --  1.5*      Coag Results   Recent Labs     08/26/18  0727   INR 1.62*      Cardiac Results      Recent Labs     08/26/18  1101 08/26/18  1811 08/27/18  0154   UHCEASTTROPI 0.00 0.01 0.01        Imaging:  XR AP MOBILE CHEST   Final Result   Right lower lobe mass has a similar appearance. No acute   infiltrate.            Radiologist  location ID: [id number]             Current Medications:  albuterol (PROVENTIL) 2.5 mg / 3 mL (0.083%) neb solution, 2.5 mg, Nebulization, Q6H  amiodarone (CORDARONE) 450 mg in D5W 250 mL infusion, 0.5 mg/min, Intravenous, Continuous  apixaban (ELIQUIS) tablet, 5 mg, Oral, 2x/day  atorvastatin (LIPITOR) tablet, 10 mg, Oral, Daily  budesonide-formoterol (SYMBICORT) 160 mcg-4.5 mcg per inhalation oral inhaler, 2 Puff, Inhalation, 2x/day  carvedilol (COREG) tablet 18.75 mg, 18.75 mg, Oral, 2x/day  ceFEPime (MAXIPIME) 2g in NS 13mL IVPB minibag, 2 g, Intravenous, Q8H  docusate sodium (COLACE) capsule, 100 mg, Oral, 3x/day PRN  DULoxetine (CYMBALTA) delayed release capsule, 30  mg, Oral, Daily  linaclotide (LINZESS) capsule, 72 mcg, Oral, QAM  magnesium oxide (MAG-OX) tablet, 400 mg, Oral, Daily  morphine 2 mg/mL injection, 2 mg, Intravenous, Q3H PRN  NS flush syringe, 3 mL, Intracatheter, Q8HRS  NS flush syringe, 3 mL, Intracatheter, Q1H PRN  NS premix infusion, , Intravenous, Continuous  nystatin (MYCOSTATIN) 100,000 units per mL oral liquid, 5 mL, Swish & Swallow, 4x/day  nystatin (MYCOSTATIN) 100,000 units/g topical cream, , Apply Topically, 2x/day  oxyCODONE (ROXICODONE) immediate release tablet, 5 mg, Oral, Q6H PRN  pantoprazole (PROTONIX) delayed release tablet, 40 mg, Oral, Daily before Breakfast  phenazopyridine (PYRIDIUM) tablet, 100 mg, Oral, 3x/day-Meals  potassium chloride (K-DUR) extended release tablet, 20 mEq, Oral, Daily  prochlorperazine (COMPAZINE) tablet, 10 mg, Oral, 4x/day PRN      Allergies   Allergen Reactions   . Shellfish Containing Products Shortness of Breath, Rash and Itching     scallops   . Vancomycin Shortness of Breath   . Barium Iodide    . Iodine And Iodide Containing Products      Itching, shortness of breath        Assessment/ Plan:    Atrial fibrillation paroxysmal patient back in normal sinus rhythm with IV amiodarone but EKG showed normal sinus rhythm  Plan stay on IV amiodarone to  tomorrow    Fever, neutropenia possible sepsis patient has got colon and lung cancer she is getting chemotherapy and radiation  Plan as per oncology doctors    Riley Churches, MD

## 2018-08-27 NOTE — Consults (Signed)
[name]  Follow-up note    Kelsey [name], Haddox  Date of Admission:  08/25/2018  Date of Service: 08/27/2018  Date of Birth:  [birth year]    Requesting MD: Pollyann Glen     Assessment/Plan:  68 y.o. 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    1. Neutropenic fever:  -  likely due to UTI.   urine cultures pending.  -  Agree with IV antibiotics.  -  status post 1 dose of Neupogen.  - neutropenia resolved.  ANC today 1700      2. Stage III adenocarcinoma lung:  -Currently on chemoradiation.    AFib with RVR:  Management as per primary team and Cardiology.      DVT/PE ProphylaxisApixaban  Disposition Planning: Home discharge     Subjective:  No new issues overnight.  Denies any fevers or chills.  Denies any worsening shortness of breath or chest pain.        REVIEW OF SYSTEMS:  GENERAL:  no fevers, chills.    HEENT: no sore throat, congestion, blurry vision  Lungs: + shortness of breath, cough, wheezes at this time  GI: no nausea, vomiting, diarrhea, constipation  GU: No dysuria, urgency, increased frequency   Cardiac: no chest pains, palpitations, syncope,   Neuro: no weakness, loss of function, numbness, tingling  Skin: no rash  Musculoskeletal: No myalgias  Psychosocial: No depression, anxiety  Hematologic: No easy bruising, abnormal bleeding  All other review ROS negative except those mentioned above.      Past Medical History:   Diagnosis Date   . A-fib (CMS HCC)    . Arthropathy, unspecified, site unspecified    . Asthma    . Cancer (CMS HCC)     cervical   . Cataract    . Colon cancer (CMS [place]) 07/06/2014   . COPD (chronic obstructive pulmonary disease) (CMS HCC)    . Fistula    . HTN (hypertension)    . Hyperlipidemia    . Obesity    . Sleep apnea    . Ventral hernia          Past Surgical History:   Procedure Laterality Date   . ABCESS DRAINAGE     . BOWEL RESECTION        . CATARACT EXTRACTION     . HX CERVICAL CONE BIOPSY      . HX CESAREAN SECTION     . HX GASTRIC BYPASS      25 years ago   . [place] (x2)   . HX HYSTERECTOMY      late 1990s   . HX LAP BANDING      2013   . HX LAPAROTOMY      2013 to remove lap band         Medications Prior to Admission     Prescriptions    acetaminophen (TYLENOL) 325 mg Oral Tablet    Take 2 Tabs (650 mg total) by mouth Every 6 hours as needed    Patient not taking:  Reported on 08/25/2018    albuterol sulfate (PROVENTIL) 2.5 mg /3 mL (0.083 %) Inhalation Solution for Nebulization    3 mL (2.5 mg total) by Nebulization route Every 6 hours    apixaban (ELIQUIS) 5 mg Oral Tablet    Take 1 Tab (5 mg total) by mouth Twice daily  atorvastatin (LIPITOR) 10 mg Oral Tablet    Take 10 mg by mouth Once a day    budesonide-formoterol (SYMBICORT) 160-4.5 mcg/actuation Inhalation HFA Aerosol Inhaler    Take 2 Puffs by inhalation Twice daily    CARVEDILOL ORAL    Take 6.25 mg by mouth Twice daily 3 tablets    cephalexin (KEFLEX) 500 mg Oral Capsule    Take 1 Cap (500 mg total) by mouth Three times a day    Patient not taking:  Reported on 08/25/2018    docusate sodium (COLACE) 100 mg Oral Capsule    Take 100 mg by mouth Three times a day as needed     DULoxetine (CYMBALTA DR) 30 mg Oral Capsule, Delayed Release(E.C.)    Take 30 mg by mouth Once a day    flecainide (TAMBOCOR) 50 mg Oral Tablet    Take 1 Tab (50 mg total) by mouth Twice daily    linaCLOtide (LINZESS) 72 mcg Oral Capsule    Take 72 mcg by mouth Every morning    loperamide (IMODIUM) 2 mg Oral Capsule    Take 2 mg by mouth Every 4 hours as needed    magnesium oxide (MAG-OX) 400 mg Oral Tablet    Take 400 mg by mouth Once a day    omeprazole (PRILOSEC) 20 mg Oral Capsule, Delayed Release(E.C.)    Take 40 mg by mouth Once a day     potassium chloride (K-DUR) 20 mEq Oral Tab Sust.Rel. Particle/Crystal    Take 20 mEq by mouth Once a day    prochlorperazine (COMPAZINE) 10 mg Oral  Tablet    Take 1 Tab (10 mg total) by mouth Four times a day as needed for nausea/vomiting    rOPINIRole (REQUIP) 0.5 mg Oral Tablet    Take 0.5 mg by mouth Every night        albuterol (PROVENTIL) 2.5 mg / 3 mL (0.083%) neb solution, 2.5 mg, Nebulization, Q6H  amiodarone (CORDARONE) 450 mg in D5W 250 mL infusion, 0.5 mg/min, Intravenous, Continuous  apixaban (ELIQUIS) tablet, 5 mg, Oral, 2x/day  atorvastatin (LIPITOR) tablet, 10 mg, Oral, Daily  budesonide-formoterol (SYMBICORT) 160 mcg-4.5 mcg per inhalation oral inhaler, 2 Puff, Inhalation, 2x/day  carvedilol (COREG) tablet 18.75 mg, 18.75 mg, Oral, 2x/day  ceFEPime (MAXIPIME) 2g in NS 72mL IVPB minibag, 2 g, Intravenous, Q8H  docusate sodium (COLACE) capsule, 100 mg, Oral, 3x/day PRN  DULoxetine (CYMBALTA) delayed release capsule, 30 mg, Oral, Daily  linaclotide (LINZESS) capsule, 72 mcg, Oral, QAM  magnesium oxide (MAG-OX) tablet, 400 mg, Oral, Daily  morphine 2 mg/mL injection, 2 mg, Intravenous, Q3H PRN  NS flush syringe, 3 mL, Intracatheter, Q8HRS  NS flush syringe, 3 mL, Intracatheter, Q1H PRN  NS premix infusion, , Intravenous, Continuous  nystatin (MYCOSTATIN) 100,000 units per mL oral liquid, 5 mL, Swish & Swallow, 4x/day  nystatin (MYCOSTATIN) 100,000 units/g topical cream, , Apply Topically, 2x/day  oxyCODONE (ROXICODONE) immediate release tablet, 5 mg, Oral, Q6H PRN  pantoprazole (PROTONIX) delayed release tablet, 40 mg, Oral, Daily before Breakfast  phenazopyridine (PYRIDIUM) tablet, 100 mg, Oral, 3x/day-Meals  potassium chloride (K-DUR) extended release tablet, 20 mEq, Oral, Daily  prochlorperazine (COMPAZINE) tablet, 10 mg, Oral, 4x/day PRN      Allergies   Allergen Reactions   . Shellfish Containing Products Shortness of Breath, Rash and Itching     scallops   . Vancomycin Shortness of Breath   . Barium Iodide    . Iodine And Iodide Containing Products  Itching, shortness of breath      Family History  Family Medical History:     Problem  Relation (Age of Onset)    Diabetes Mother, Maternal Grandmother, Maternal Grandfather, Father    Heart Attack Mother    Stroke Father          Social History  Social History     Socioeconomic History   . Marital status: Married     Spouse name: Not on file   . Number of children: Not on file   . Years of education: Not on file   . Highest education level: Not on file   Occupational History   . Not on file   Social Needs   . Financial resource strain: Not on file   . Food insecurity:     Worry: Not on file     Inability: Not on file   . Transportation needs:     Medical: Not on file     Non-medical: Not on file   Tobacco Use   . Smoking status: Former Research scientist (life sciences)   . Smokeless tobacco: Never Used   . Tobacco comment: quit 10 yrs ago   Substance and Sexual Activity   . Alcohol use: Not Currently     Comment: rarely   . Drug use: No   . Sexual activity: Not on file   Lifestyle   . Physical activity:     Days per week: Not on file     Minutes per session: Not on file   . Stress: Not on file   Relationships   . Social connections:     Talks on phone: Not on file     Gets together: Not on file     Attends religious service: Not on file     Active member of club or organization: Not on file     Attends meetings of clubs or organizations: Not on file     Relationship status: Not on file   . Intimate partner violence:     Fear of current or ex partner: Not on file     Emotionally abused: Not on file     Physically abused: Not on file     Forced sexual activity: Not on file   Other Topics Concern   . Ability to Walk 1 Flight of Steps without SOB/CP No   . Routine Exercise Not Asked   . Ability to Walk 2 Flight of Steps without SOB/CP Not Asked   . Unable to Ambulate Not Asked   . Total Care Not Asked   . Ability To Do Own ADL's Yes   . Uses Walker Not Asked   . Other Activity Level Not Asked   . Uses Cane Not Asked   Social History Narrative   . Not on file       Objective     EXAM  VITALS:    Temperature: 36.8 C (98.2  F)  Heart Rate: 77  BP (Non-Invasive): (!) 117/48  Respiratory Rate: 20  SpO2: 97 %  Pain Score (Numeric, Faces): 0  Constitutional:  appears chronically ill  Neck:  no thyromegaly or lymphadenopathy  Respiratory:  Clear to auscultation bilaterally.   Cardiovascular:  regular rate and rhythm  Gastrointestinal:  Soft, non-tender  Musculoskeletal:  Head atraumatic and normocephalic  Neurologic:  Grossly normal  Lymphatic/Immunologic/Hematologic:  No lymphadenopathy    IMAGES:    XR AP MOBILE CHEST   Final Result   Right lower lobe mass  has a similar appearance. No acute   infiltrate.            Radiologist location ID: [id number]               LABS:    I have reviewed all lab results.  Lab Results Today:    Results for orders placed or performed during the hospital encounter of 08/25/18 (from the past 24 hour(s))   ECG 12 LEAD - ADULT   Result Value Ref Range    Heart Rate 75 BPM    PR Interval 169 ms    QRS Duration 107 ms    QT Interval 414 ms    QTC Calculation 463 ms    Calculated P Axis 66 deg    QRS Axis 50 deg    Calculated T Axis 151 deg    I 40 Axis 23 deg    T 40 Axis 96 deg    ST Axis 153 deg    EKG Severity - ABNORMAL ECG -    TROPONIN-I   Result Value Ref Range    TROPONIN I 0.01 <=0.04 ng/mL   TROPONIN-I   Result Value Ref Range    TROPONIN I 0.01 <=0.04 ng/mL   BASIC METABOLIC PANEL   Result Value Ref Range    SODIUM 137 135 - 145 mmol/L    POTASSIUM 3.5 3.5 - 5.0 mmol/L    CHLORIDE 109 98 - 111 mmol/L    CO2 TOTAL 25 21 - 35 mmol/L    ANION GAP 3 mmol/L    CALCIUM 7.8 (L) 8.8 - 10.3 mg/dL    GLUCOSE 115 (H) 70 - 110 mg/dL    BUN 8 (L) 10 - 25 mg/dL    CREATININE 0.74 <=1.30 mg/dL    BUN/CREA RATIO 11     ESTIMATED GFR >60 Avg: 85 mL/min/1.53m^2   MAGNESIUM   Result Value Ref Range    MAGNESIUM 1.9 1.8 - 2.3 mg/dL   PHOSPHORUS   Result Value Ref Range    PHOSPHORUS 3.4 2.5 - 4.5 mg/dL   CBC WITH DIFF   Result Value Ref Range    WBC 2.5 (L) 3.5 - 10.3 x10^3/uL    RBC 3.37 (L) 3.80 - 5.24 x10^6/uL    HGB  8.9 (L) 11.8 - 15.8 g/dL    HCT 27.1 (L) 34.6 - 46.2 %    MCV 80.4 (L) 82.3 - 96.7 fL    MCH 26.6 (L) 27.6 - 33.2 pg    MCHC 33.0 32.6 - 35.4 g/dL    RDW 15.5 (H) 12.4 - 15.2 %    PLATELETS 122 (L) 140 - 440 x10^3/uL    MPV 8.4 6.6 - 10.2 fL    NEUTROPHIL % 62 %    LYMPHOCYTE % 19 %    MONOCYTE % 19 %    EOSINOPHIL % 1 %    BASOPHIL % 0 %    NEUTROPHIL # 1.60 1.50 - 6.40 x10^3/uL    LYMPHOCYTE # 0.50 (L) 0.90 - 3.40 x10^3/uL    MONOCYTE # 0.50 0.20 - 0.90 x10^3/uL    EOSINOPHIL # 0.00 0.00 - 0.50 x10^3/uL    BASOPHIL # 0.00 0.00 - 0.20 x10^3/uL   MANUAL DIFFERENTIAL   Result Value Ref Range    NEUTROPHIL % 51 50 - 70 %    LYMPHOCYTE % 19 (L) 20 - 40 %    MONOCYTE % 13 (H) 2 - 11 %    BAND % 17 (H) 0 -  6 %    NEUTROPHIL ABSOLUTE 1.70 x10^3/uL    LYMPHOCYTE ABSOLUTE 0.48 x10^3/uL    MONOCYTE ABSOLUTE 0.33 x10^3/uL    PLATELET ESTIMATE Slightly Decreased     LARGE PLATELETS Present     ANISOCYTOSIS 1+     HYPOCHROMASIA 1+     POLYCHROMASIA 1+     BASOPHILIC STIPPLING 1+     WBC MORPHOLOGY COMMENT Normal     WBC 2.5 x10^3/uL   ECG 12 LEAD - ADULT   Result Value Ref Range    Heart Rate 73 BPM    PR Interval 169 ms    QRS Duration 109 ms    QT Interval 430 ms    QTC Calculation 474 ms    Calculated P Axis 71 deg    QRS Axis 51 deg    Calculated T Axis 174 deg    I 40 Axis 30 deg    T 40 Axis 93 deg    ST Axis 176 deg    EKG Severity - ABNORMAL ECG -          Vanetta Mulders, MD

## 2018-08-28 ENCOUNTER — Ambulatory Visit (HOSPITAL_COMMUNITY): Payer: Self-pay | Admitting: Radiation Oncology

## 2018-08-28 ENCOUNTER — Ambulatory Visit (HOSPITAL_COMMUNITY): Payer: Medicare Other

## 2018-08-28 LAB — CBC WITH DIFF
BASOPHIL #: 0 x10ˆ3/uL (ref 0.00–0.20)
BASOPHIL %: 0 %
EOSINOPHIL #: 0 10*3/uL (ref 0.00–0.50)
EOSINOPHIL %: 1 %
HCT: 26.7 % — ABNORMAL LOW (ref 34.6–46.2)
HGB: 9 g/dL — ABNORMAL LOW (ref 11.8–15.8)
LYMPHOCYTE #: 0.6 10*3/uL — ABNORMAL LOW (ref 0.90–3.40)
LYMPHOCYTE %: 13 %
MCH: 27 pg — ABNORMAL LOW (ref 27.6–33.2)
MCHC: 33.6 g/dL (ref 32.6–35.4)
MCV: 80.2 fL — ABNORMAL LOW (ref 82.3–96.7)
MONOCYTE #: 0.9 x10ˆ3/uL (ref 0.20–0.90)
MONOCYTE %: 18 %
MPV: 8.2 fL (ref 6.6–10.2)
MPV: 8.2 fL (ref 6.6–10.2)
NEUTROPHIL #: 3.3 10*3/uL (ref 1.50–6.40)
NEUTROPHIL %: 68 %
PLATELETS: 129 10*3/uL — ABNORMAL LOW (ref 140–440)
RBC: 3.33 10*6/uL — ABNORMAL LOW (ref 3.80–5.24)
RBC: 3.33 10*6/uL — ABNORMAL LOW (ref 3.80–5.24)
RDW: 16.2 % — ABNORMAL HIGH (ref 12.4–15.2)
WBC: 4.8 x10ˆ3/uL (ref 3.5–10.3)

## 2018-08-28 LAB — MANUAL DIFFERENTIAL
BAND %: 10 % — ABNORMAL HIGH (ref 0–6)
LYMPHOCYTE %: 18 % — ABNORMAL LOW (ref 20–40)
LYMPHOCYTE ABSOLUTE: 0.86 10*3/uL
METAMYELOCYTE %: 4 %
METAMYELOCYTE %: 4 %
METAMYELOCYTE ABSOLUTE: 0.19 10*3/uL
MONOCYTE %: 12 % — ABNORMAL HIGH (ref 2–11)
MONOCYTE ABSOLUTE: 0.58 10*3/uL
MYELOCYTE %: 3 %
MYELOCYTE ABSOLUTE: 0.14 10*3/uL
NEUTROPHIL %: 53 % (ref 50–70)
NEUTROPHIL ABSOLUTE: 3.02 10*3/uL
PLATELET ESTIMATE: DECREASED
WBC: 4.8 x10ˆ3/uL

## 2018-08-28 LAB — BASIC METABOLIC PANEL
ANION GAP: 5 mmol/L
BUN/CREA RATIO: 11
BUN: 8 mg/dL — ABNORMAL LOW (ref 10–25)
CALCIUM: 7.9 mg/dL — ABNORMAL LOW (ref 8.8–10.3)
CHLORIDE: 110 mmol/L (ref 98–111)
CO2 TOTAL: 24 mmol/L (ref 21–35)
CREATININE: 0.7 mg/dL (ref <=1.30)
ESTIMATED GFR: >60 mL/min/1.73mˆ2
GLUCOSE: 108 mg/dL (ref 70–110)
POTASSIUM: 3.7 mmol/L (ref 3.5–5.0)
SODIUM: 139 mmol/L (ref 135–145)

## 2018-08-28 LAB — MAGNESIUM: MAGNESIUM: 1.8 mg/dL (ref 1.8–2.3)

## 2018-08-28 LAB — URINE CULTURE: URINE CULTURE: 100000 — AB

## 2018-08-28 MED ORDER — SULFAMETHOXAZOLE 800 MG-TRIMETHOPRIM 160 MG TABLET
1.00 | ORAL_TABLET | Freq: Two times a day (BID) | ORAL | Status: DC
Start: 2018-08-28 — End: 2018-08-29
  Administered 2018-08-28 – 2018-08-29 (×3): 160 mg via ORAL
  Filled 2018-08-28 (×6): qty 1

## 2018-08-28 MED ORDER — AMIODARONE 200 MG TABLET
200.00 mg | ORAL_TABLET | Freq: Two times a day (BID) | ORAL | Status: DC
Start: 2018-08-28 — End: 2018-08-29
  Administered 2018-08-28 – 2018-08-29 (×4): 200 mg via ORAL
  Filled 2018-08-28 (×6): qty 1

## 2018-08-28 MED ADMIN — sodium chloride 0.9 % intravenous solution: ORAL | @ 09:00:00 | NDC 00338004904

## 2018-08-28 MED ADMIN — PANTOPRAZOLE 80MG IN NS 100ML CONTINUOUS INFUSION: @ 04:00:00 | NDC 00008092355

## 2018-08-28 MED ADMIN — dextrose 5 % and 0.9 % sodium chloride intravenous solution: INTRAVENOUS | @ 21:00:00 | NDC 00338008904

## 2018-08-28 MED ADMIN — NICARDIPINE 25MG IN D5W OR NS 250ML INFUSION - CHI: ORAL | @ 09:00:00

## 2018-08-28 MED ADMIN — nystatin 100,000 unit/mL oral suspension: ORAL | @ 21:00:00

## 2018-08-28 MED ADMIN — budesonide-formoterol HFA 160 mcg-4.5 mcg/actuation aerosol inhaler: RESPIRATORY_TRACT | @ 23:00:00

## 2018-08-28 MED ADMIN — morphine 2 mg/mL intravenous syringe: INTRAVENOUS | @ 12:00:00

## 2018-08-28 MED ADMIN — magnesium oxide 400 mg (241.3 mg magnesium) tablet: ORAL | @ 09:00:00

## 2018-08-28 MED ADMIN — sodium chloride 0.9 % (flush) injection syringe: ORAL | @ 21:00:00

## 2018-08-28 MED ADMIN — HYDROmorphone (PF) 30 mg/30 mL (1 mg/mL) in 0.9 % NaCl IV PCA syringe: ORAL | @ 16:00:00

## 2018-08-28 MED ADMIN — sodium chloride 0.9 % (flush) injection syringe: ORAL | @ 07:00:00

## 2018-08-28 NOTE — Care Plan (Signed)
Pt resting in chair in room, no s/s acute distress, pain was adequately controlled this shift. SR on monitor with PAC, amio continues per order.  Call light within reach.     Problem: Adult Inpatient Plan of Care  Goal: Plan of Care Review  Outcome: Ongoing (see interventions/notes)  Goal: Patient-Specific Goal (Individualization)  Outcome: Ongoing (see interventions/notes)  Goal: Absence of Hospital-Acquired Illness or Injury  Outcome: Ongoing (see interventions/notes)  Goal: Optimal Comfort and Wellbeing  Outcome: Ongoing (see interventions/notes)  Goal: Rounds/Family Conference  Outcome: Ongoing (see interventions/notes)     Problem: Infection  Goal: Infection Symptom Resolution  Outcome: Ongoing (see interventions/notes)     Problem: Fever (Fever with Neutropenia)  Goal: Baseline Body Temperature  Outcome: Ongoing (see interventions/notes)     Problem: Infection Risk (Fever with Neutropenia)  Goal: Absence of Infection  Outcome: Ongoing (see interventions/notes)     Problem: Fall Injury Risk  Goal: Absence of Fall and Fall-Related Injury  Outcome: Ongoing (see interventions/notes)

## 2018-08-28 NOTE — Consults (Signed)
[facility]  Cardiology   Progress Note      Kelsey [name], Kelsey [name]  Date of Admission:  08/25/2018  Date of service: 08/28/2018  Date of Birth:  10-11-1950  MRN:  [id number]  Hospital Day:  LOS: 3 days     Subjective:     Patient is feeling good per patient anxious to go home.  Denies any chest pain, short of breath, fever, orthopnea, palpitations, dizzy spell per patient walking within the room patient needs clearance to have radiation    Objective:     Vital Signs:  Filed Vitals:    08/27/18 1800 08/27/18 2014 08/27/18 2300 08/28/18 0300   BP: 136/81 (!) 141/72 (!) 159/59 118/62   Pulse: 76 74 78 68   Resp:  16 18 20    Temp:  36.7 C (98 F) 37.3 C (99.2 F) 36.8 C (98.2 F)   SpO2:  99% 96% 97%       Physical Exam:    General: Patient not in any acute distress.   HEENT: No JVD, No carotid bruit b/l  Lungs: Clear to auscultation and percussion.  Cardiovascular: Heart sounds are regular, systolic murmur.  Apex  Extremities: No edema, good peripheral pulses.  Neurology: Alert, awake and oriented x3     Telemetry reviewed:  Normal rhythm 60-70 PACs    Labs:  No results found for: CHOLESTEROL, HDLCHOL, LDLCHOL, LDLCHOLDIR, TRIG  CBC Results   Recent Labs     08/25/18  0933 08/26/18  0727 08/27/18  0547   WBC 1.3* 1.3* 2.5*  2.5   HGB 11.9 10.8* 8.9*   HCT 35.6 32.2* 27.1*   PLTCNT 153 137* 122*   BANDS  --   --  17*      BMP Results   Recent Labs     08/25/18  0933 08/26/18  0727 08/27/18  0547   SODIUM 137 138 137   POTASSIUM 3.9 3.5 3.5   CHLORIDE 101 106 109   CO2 30 25 25    BUN 13 9* 8*   CREATININE 0.69 0.67 0.74   GFR >60 >60 >60   ANIONGAP 6 7 3      Recent Labs     08/25/18  0933 08/26/18  0727 08/27/18  0547   CALCIUM 9.0 8.2* 7.8*   ALBUMIN 4.0  --   --    MAGNESIUM  --  1.5* 1.9      Coag Results   Recent Labs     08/26/18  0727   INR 1.62*      Cardiac Results      Recent Labs     08/26/18  1101 08/26/18  1811 08/27/18  0154   UHCEASTTROPI 0.00 0.01 0.01        Imaging:  XR AP MOBILE CHEST   Final Result   Right lower lobe  mass has a similar appearance. No acute   infiltrate.            Radiologist location ID: [id number]             Current Medications:  albuterol (PROVENTIL) 2.5 mg / 3 mL (0.083%) neb solution, 2.5 mg, Nebulization, Q6H  amiodarone (CORDARONE) 450 mg in D5W 250 mL infusion, 0.5 mg/min, Intravenous, Continuous  apixaban (ELIQUIS) tablet, 5 mg, Oral, 2x/day  atorvastatin (LIPITOR) tablet, 10 mg, Oral, Daily  budesonide-formoterol (SYMBICORT) 160 mcg-4.5 mcg per inhalation oral inhaler, 2 Puff, Inhalation, 2x/day  carvedilol (COREG) tablet 18.75 mg, 18.75 mg, Oral, 2x/day  ceFEPime (MAXIPIME) 2g  in NS 50mL IVPB minibag, 2 g, Intravenous, Q8H  docusate sodium (COLACE) capsule, 100 mg, Oral, 3x/day PRN  DULoxetine (CYMBALTA) delayed release capsule, 30 mg, Oral, Daily  linaclotide (LINZESS) capsule, 72 mcg, Oral, QAM  magnesium oxide (MAG-OX) tablet, 400 mg, Oral, Daily  morphine 2 mg/mL injection, 2 mg, Intravenous, Q3H PRN  NS flush syringe, 3 mL, Intracatheter, Q8HRS  NS flush syringe, 3 mL, Intracatheter, Q1H PRN  NS premix infusion, , Intravenous, Continuous  nystatin (MYCOSTATIN) 100,000 units per mL oral liquid, 5 mL, Swish & Swallow, 4x/day  nystatin (MYCOSTATIN) 100,000 units/g topical cream, , Apply Topically, 2x/day  oxyCODONE (ROXICODONE) immediate release tablet, 5 mg, Oral, Q6H PRN  pantoprazole (PROTONIX) delayed release tablet, 40 mg, Oral, Daily before Breakfast  phenazopyridine (PYRIDIUM) tablet, 100 mg, Oral, 3x/day-Meals  potassium chloride (K-DUR) extended release tablet, 20 mEq, Oral, Daily  prochlorperazine (COMPAZINE) tablet, 10 mg, Oral, 4x/day PRN      Allergies   Allergen Reactions   . Shellfish Containing Products Shortness of Breath, Rash and Itching     scallops   . Vancomycin Shortness of Breath   . Barium Iodide    . Iodine And Iodide Containing Products      Itching, shortness of breath        Assessment/ Plan:    Atrial fibrillation, paroxysmal patient is in normal sinus rhythm.  Patient  tolerating IV amiodarone.  Spoke to patient all the side effects of amiodarone need for labs such as TSH, LFTs every 6 months chest x-ray eye examination once a year and PFTs on required basis as long she is on amiodarone  Plan discontinue IV amiodarone will switch to p.o. Amiodarone 200 b.i.d.    Lung cancer status sees status post radiation status post chemotherapy causing neutropenia and fever  Plan as per oncology doctors okay to have radiation from cardiac point of view  Riley Churches, MD

## 2018-08-28 NOTE — Nurses Notes (Signed)
Telephone encounter with Musician from [employer].  Pt cleared by Dr. Deatra Canter and cardiologist for Rad tx today.  Jimmye Norman, RN    Rad tech's notified. Jimmye Norman, RN

## 2018-08-28 NOTE — Nurses Notes (Addendum)
Biofire negative on 08/26/18, droplet precautions discontinued. Teresita Madura, RN

## 2018-08-28 NOTE — Progress Notes (Signed)
[facility] [place] [address]     Phone: [phone]  Fax: [place] NOTE     Patient Name: Kelsey [name]  Date of service: 08/28/2018  Date of Admission:  08/25/2018  Hospital Day:  LOS: 3 days     SUMMARY     Biance Moncrief is a 68 y.o. old female, with Stage III adenocarcinoma of lung admitted for febrile neutropenia. She was started on IV abx, awaiting blood and urine cultures. UA shows UTI. While admited she became tachycardic and was determeined to be in Afib with RVR. Dr. Reece Levy consulted and started patient on IV Amiodarone and holding flecainide. She has been transitioned to PO Amiodarone.    SUBJECTIVE     Patient seen and examined. Patient has no complaints. She is feeling much better. Slept well overnight. Radiation today.    Diet - regular  Code Status - Full Code    Review of systems:   Review of Systems   Constitutional: Negative for activity change, chills and fever.   HENT: Negative for congestion, hearing loss, sneezing and sore throat.    Eyes: Negative for pain, discharge and visual disturbance.   Respiratory: Negative for cough, chest tightness, shortness of breath and wheezing.    Cardiovascular: Negative for chest pain, palpitations and leg swelling.   Gastrointestinal: Negative for abdominal distention, abdominal pain, constipation, diarrhea, nausea and vomiting.   Genitourinary: Negative for dysuria, frequency and urgency.   Musculoskeletal: Negative for arthralgias and back pain.        Pain RUQ to RL Thoracic, epigastric    Skin: Negative for rash.   Neurological: Negative for dizziness, syncope, light-headedness and headaches.   Psychiatric/Behavioral: Negative for self-injury, sleep disturbance and suicidal ideas.       OBJECTIVE     Vital Signs:  BP (!) 146/72   Pulse 73   Temp 36.7 C (98.1 F)   Resp 19   Ht 1.575 m (5' 2.01")   Wt 126.4 kg (278 lb 9.6 oz)   SpO2 98%   BMI 50.94 kg/m         Physical  Exam:  GEN:  Patient is alert, cooperative, in no acute distress   HEENT: Normocephalic, atraumatic. Pupils equal, round and reactive. EOMI.  LUNGS: Non labored breathing. Clear to auscultation  CV: regular rhythm , no murmur  ABDOMEN: Soft, non-distended  SKIN: Warm and dry  NEURO: Awake, Alert and Oriented  EXTREMITIES:  Atraumatic, no edema or cyanosis     I/O:    Intake/Output Summary (Last 24 hours) at 08/28/2018 0846  Last data filed at 08/27/2018 1300  Gross per 24 hour   Intake 260 ml   Output -   Net 260 ml       Blood Sugars:   No results for input(s): GLUIP in the last 48 hours.     LABS & IMAGING     CBC Results    Recent Labs     08/26/18  0727 08/27/18  0547 08/28/18  0536   WBC 1.3* 2.5*  2.5 4.8  4.8   HGB 10.8* 8.9* 9.0*   HCT 32.2* 27.1* 26.7*   PLTCNT 137* 122* 129*   BANDS  --  17* 10*      BMP Results    Recent Labs     08/25/18  0933 08/26/18  0727 08/27/18  0547 08/28/18  0536   SODIUM  137 138 137 139   POTASSIUM 3.9 3.5 3.5 3.7   CHLORIDE 101 106 109 110   CO2 30 25 25 24    BUN 13 9* 8* 8*   CREATININE 0.69 0.67 0.74 0.70   GFR >60 >60 >60 >60   ANIONGAP 6 7 3 5       Recent Labs     08/25/18  0933 08/26/18  0727 08/27/18  0547 08/28/18  0536   CALCIUM 9.0 8.2* 7.8* 7.9*   ALBUMIN 4.0  --   --   --    MAGNESIUM  --  1.5* 1.9 1.8      Cardiac Results   Recent Labs     08/26/18  1101 08/26/18  1811 08/27/18  0154   UHCEASTTROPI 0.00 0.01 0.01      Liver/Pancrease Enzyme Results   Recent Labs     08/25/18  0933   TOTALPROTEIN 6.3   ALBUMIN 4.0   AST 26   ALT 31   ALKPHOS 64      Coagulation Studies    Recent Labs     08/26/18  0727   INR 1.62*      Lipid Panel Results    No results found for: CHOLESTEROL, HDLCHOL, LDLCHOL, LDLCHOLDIR, TRIG   Imaging Studies         Microbiology / Pathology      Hospital Encounter on 08/25/18 (from the past 96 hour(s))   ADULT ROUTINE BLOOD CULTURE, SET OF 2 BOTTLES (BACTERIA AND YEAST)    Collection Time: 08/25/18  1:57 PM   Culture Result Status    BLOOD CULTURE,  ROUTINE No Growth 2 Days Preliminary   ADULT ROUTINE BLOOD CULTURE, SET OF 2 BOTTLES (BACTERIA AND YEAST)    Collection Time: 08/25/18  2:40 PM   Culture Result Status    BLOOD CULTURE, ROUTINE No Growth 2 Days Preliminary   URINE CULTURE    Collection Time: 08/25/18  2:50 PM   Culture Result Status    URINE CULTURE (A) Final     100000 Klebsiella aerogenes (formerly Enterobacter aerogenes)       Susceptibility    Klebsiella aerogenes (formerly Enterobacter aerogenes) -  (no method available)     Cefazolin >=64 Resistant      Cefepime <=1 Sensitive      Ceftriaxone 16 Intermediate      Ciprofloxacin <=0.25 Sensitive      Ertapenem <=0.5 Sensitive      Gentamicin <=1 Sensitive      Nitrofurantoin 64 Intermediate      Piperacillin/Tazobactam >=128 Resistant      Tobramycin <=1 Sensitive      Trimethoprim/Sulfamethoxazole <=20 Sensitive    URINE CULTURE,ROUTINE    Collection Time: 08/25/18  2:50 PM   Culture Result Status    URINE CULTURE (A) Final     100000 Klebsiella aerogenes (formerly Enterobacter aerogenes)       Susceptibility    Klebsiella aerogenes (formerly Enterobacter aerogenes) -  (no method available)     Cefazolin >=64 Resistant      Cefepime <=1 Sensitive      Ceftriaxone 16 Intermediate      Ciprofloxacin <=0.25 Sensitive      Ertapenem <=0.5 Sensitive      Gentamicin <=1 Sensitive      Nitrofurantoin 64 Intermediate      Piperacillin/Tazobactam >=128 Resistant      Tobramycin <=1 Sensitive      Trimethoprim/Sulfamethoxazole <=20 Sensitive    RESPIRATORY VIRUS PANEL BY BIOFIRE  FILM ARRAY    Collection Time: 08/25/18  5:44 PM   Culture Result Status    ADENOVIRUS ARRAY Target Not Detected Final    CORONAVIRUS 229E Target Not Detected Final    CORONAVIRUS HKU1 Target Not Detected Final    CORONAVIRUS NL63 Target Not Detected Final    CORONAVIRUS OC43 Target Not Detected Final    METAPNEUMOVIRUS ARRAY Target Not Detected Final    RHINOVIRUS/ENTEROVIRUS ARRAY Target Not Detected Final    INFLUENZA A Target  Not Detected Final    INFLUENZA B ARRAY Target Not Detected Final    PARAINFLUENZA 1 ARRAY Target Not Detected Final    PARAINFLUENZA 2 ARRAY Target Not Detected Final    PARAINFLUENZA 3 ARRAY Target Not Detected Final    PARAINFLUENZA 4 ARRAY Target Not Detected Final    RSV ARRAY Target Not Detected Final    BORDETELLA PERTUSSIS ARRAY Target Not Detected Final    CHLAMYDOPHILA PNEUMONIAE ARRAY Target Not Detected Final    MYCOPLASMA PNEUMONIAE ARRAY Target Not Detected Final    Narrative    Performed by Film Array PCR using the Biofire Respiratory Panel. The results of this test should not be used as the sole basis for diagnosis, treatment, or other management decisions.   Test developed and performance on this sample type validated at [facility] using an FDA cleared assay.       No results found for this visit on 08/25/18.     INPATIENT MEDICATIONS   albuterol (PROVENTIL) 2.5 mg / 3 mL (0.083%) neb solution, 2.5 mg, Nebulization, Q6H  amiodarone (CORDARONE) tablet, 200 mg, Oral, 2x/day  apixaban (ELIQUIS) tablet, 5 mg, Oral, 2x/day  atorvastatin (LIPITOR) tablet, 10 mg, Oral, Daily  budesonide-formoterol (SYMBICORT) 160 mcg-4.5 mcg per inhalation oral inhaler, 2 Puff, Inhalation, 2x/day  carvedilol (COREG) tablet 18.75 mg, 18.75 mg, Oral, 2x/day  docusate sodium (COLACE) capsule, 100 mg, Oral, 3x/day PRN  DULoxetine (CYMBALTA) delayed release capsule, 30 mg, Oral, Daily  linaclotide (LINZESS) capsule, 72 mcg, Oral, QAM  magnesium oxide (MAG-OX) tablet, 400 mg, Oral, Daily  morphine 2 mg/mL injection, 2 mg, Intravenous, Q3H PRN  NS flush syringe, 3 mL, Intracatheter, Q8HRS  NS flush syringe, 3 mL, Intracatheter, Q1H PRN  NS premix infusion, , Intravenous, Continuous  nystatin (MYCOSTATIN) 100,000 units per mL oral liquid, 5 mL, Swish & Swallow, 4x/day  nystatin (MYCOSTATIN) 100,000 units/g topical cream, , Apply Topically, 2x/day  oxyCODONE (ROXICODONE) immediate release tablet, 5 mg, Oral, Q6H PRN  pantoprazole  (PROTONIX) delayed release tablet, 40 mg, Oral, Daily before Breakfast  phenazopyridine (PYRIDIUM) tablet, 100 mg, Oral, 3x/day-Meals  potassium chloride (K-DUR) extended release tablet, 20 mEq, Oral, Daily  prochlorperazine (COMPAZINE) tablet, 10 mg, Oral, 4x/day PRN  trimethoprim-sulfamethoxazole (BACTRIM DS) 160-800mg  per tablet, 1 Tab, Oral, 2x/day        ASSESSMENT & PLAN     Active Hospital Problems    Diagnosis   . Neutropenic fever (CMS HCC)     Neutropenic Fever secondary to UTI  - Neutropenia resolved  - ANC 1700->3.3 on 08/27/18  - Admission [place] 700  - received 1 dose neupogen 08/26/18  - Afebrile   - UCx growing 100000 Klebsiella, sensitive to Bactrim  - Cefepime 2G Q8 day 3  - BCx NG 18-24 hours  - CXR negative, biofire negative  - Oxycodone IR 5mg  Q6 PRN, Morphine 2mg  Q3 PRN Breakthrough  - Holding chemoradiation  - Oncology, Dr. Deatra Canter, consulted, recommended neupogen    Atrial Fibrillation with RVR  -  Hr well controlled in 70s-80s  - Troponin negative   - IV Amiodarone, transitioned to PO Amio per cardiology   - Cardiology, Dr. Reece Levy, following   - On Eliquis 5mg  BID    Adenocarcinoma of Lung, Stage III  - Lung adenocarcinoma positive for CK 7 and TTF 1, negative for P 63 and CK 20  - Started chemoradiation on July 23, 2018, last tx 08/21/18  - Chemo regimen Paclitaxel/Carboplatin  - Holding chemoradiation  - Patient also has history of stage IIIB, T3, N1a, M0, low-grade  sigmoid cancer in 2015    COPD  - SOB improved   - CXR showed no acute infiltrate  - Continue home Symbicort BID, albuterol PRN    Hyperlipidemia  - Continue Lipitor 10mg     Chemo Induced Nausea and Vomiting  - Continue compazine 10mg  4x/day PRN    Chronic Constipation  - Continue home Linzess, colace    HypoMg/P  - resolved       PT/OT: No  DVT/PE Prophylaxis: Apixaban  DNR Status:  Full Code  Consults: Oncology      Discharge Planning:   Home with 3 more days Abx, follow up in [facility] on 1/13    Lovette Cliche, D.O. 08/28/2018  05:56  PGY-1 [place]  [address]  [place], [place] [address]  Phone: [phone] / Fax: [phone]           08/28/2018  ________________________________________________________________________    I personally saw and evaluated the patient.  I have discussed the patient's case and management with the family medicine team.   I have reviewed the resident's progress note and agree with the findings and plan of care as documented.  Any exceptions/additions are edited/noted.  Feeling better today, denies any chest pain or palpitations.  Currently on Amiodarone for AFib with RVR, patient wants to wait until Monday for radiation.  Plan to transition to oral antibiotics for UTI, neutropenia resolved.  Likely home tomorrow if clinically stable.    [place], DO 08/28/2018, 15:18  Faculty Physician  [facility]  Family Medicine Residency

## 2018-08-28 NOTE — Nurses Notes (Signed)
Telephone encounter with Musician from [employer].  Pt refusing rad tx today. Jimmye Norman, RN

## 2018-08-28 NOTE — Care Plan (Signed)
Patient alert and oriented throughout shift.  Patient free of falls.  Vital signs stable.  Patient denies any chest pain or shortness of breath.   IV fluids continued.  Patient continues to work towards discharge goals.       BP (!) 160/76   Pulse 77   Temp 36.8 C (98.2 F)   Resp 18   Ht 1.575 m (5' 2.01")   Wt 126.4 kg (278 lb 9.6 oz)   SpO2 96%   BMI 50.94 kg/m     Dorita Fray, RN  08/28/2018, 18:46      Problem: Adult Inpatient Plan of Care  Goal: Plan of Care Review  Flowsheets (Taken 08/28/2018 0800)  Plan of Care Reviewed With: patient  Goal: Absence of Hospital-Acquired Illness or Injury  Intervention: Identify and Manage Fall Risk  Flowsheets (Taken 08/28/2018 1537)  Safety Promotion/Fall Prevention: activity supervised;fall prevention program maintained;nonskid shoes/slippers when out of bed;safety round/check completed  Intervention: Prevent VTE (venous thromboembolism)  Flowsheets (Taken 08/28/2018 1200 by Laural Golden, PCA)  VTE Prevention/Management: ambulation promoted  Goal: Optimal Comfort and Wellbeing  Intervention: Provide Person-Centered Care  Flowsheets (Taken 08/28/2018 0800)  Trust Relationship/Rapport: care explained;questions answered;questions encouraged     Problem: Infection  Goal: Infection Symptom Resolution  Intervention: Prevent or Manage Infection  Flowsheets  Taken 08/26/2018 0910 by Marita Snellen, RN  Fever Reduction/Comfort Measures: lightweight bedding;lightweight clothing  Taken 08/27/2018 2000 by Loren Racer, RN  Isolation Precautions: droplet precautions maintained;protective environment precautions maintained  Taken 08/26/2018 1630 by Jerlyn Ly, RN  Infection Management: aseptic technique maintained     Problem: Infection Risk (Fever with Neutropenia)  Goal: Absence of Infection  Intervention: Prevent Infection and Maximize Resistance  Flowsheets (Taken 08/26/2018 2000 by Loren Racer, RN)  Infection Prevention: personal protective equipment utilized     Problem:  Fall Injury Risk  Goal: Absence of Fall and Fall-Related Injury  Intervention: Identify and Manage Contributors to Fall Injury Risk  Flowsheets  Taken 08/25/2018 2035 by Milinda Cave, RN  Self-Care Promotion: independence encouraged;BADL personal objects within reach  Taken 08/28/2018 1537 by Dorita Fray, RN  Medication Review/Management: medications reviewed;high risk medications identified  Intervention: Promote Injury-Free Environment  Flowsheets  Taken 08/28/2018 1537 by Dorita Fray, RN  Safety Promotion/Fall Prevention: activity supervised;fall prevention program maintained;nonskid shoes/slippers when out of bed;safety round/check completed  Taken 08/28/2018 1200 by Laural Golden, PCA  Environmental Safety Modification: assistive device/personal items within reach;clutter free environment maintained

## 2018-08-29 ENCOUNTER — Ambulatory Visit (HOSPITAL_COMMUNITY): Payer: Medicare Other

## 2018-08-29 DIAGNOSIS — I4891 Unspecified atrial fibrillation: Secondary | ICD-10-CM

## 2018-08-29 LAB — CBC WITH DIFF
BASOPHIL #: 0 x10?3/uL (ref 0.00–0.20)
BASOPHIL #: 0 x10ˆ3/uL (ref 0.00–0.20)
BASOPHIL %: 0 %
EOSINOPHIL #: 0 x10ˆ3/uL (ref 0.00–0.50)
EOSINOPHIL %: 0 %
HCT: 29.9 % — ABNORMAL LOW (ref 34.6–46.2)
HGB: 9.8 g/dL — ABNORMAL LOW (ref 11.8–15.8)
LYMPHOCYTE #: 0.8 10*3/uL — ABNORMAL LOW (ref 0.90–3.40)
LYMPHOCYTE %: 12 %
MCH: 26.5 pg — ABNORMAL LOW (ref 27.6–33.2)
MCH: 26.5 pg — ABNORMAL LOW (ref 27.6–33.2)
MCHC: 33 g/dL (ref 32.6–35.4)
MCV: 80.5 fL — ABNORMAL LOW (ref 82.3–96.7)
MONOCYTE #: 1 x10ˆ3/uL — ABNORMAL HIGH (ref 0.20–0.90)
MONOCYTE %: 16 %
MPV: 8 fL (ref 6.6–10.2)
NEUTROPHIL #: 4.4 10*3/uL (ref 1.50–6.40)
NEUTROPHIL %: 71 %
PLATELETS: 178 10*3/uL (ref 140–440)
RBC: 3.71 x10ˆ6/uL — ABNORMAL LOW (ref 3.80–5.24)
RDW: 16.5 % — ABNORMAL HIGH (ref 12.4–15.2)
WBC: 6.1 10*3/uL (ref 3.5–10.3)
WBC: 6.1 x10ˆ3/uL (ref 3.5–10.3)

## 2018-08-29 LAB — MANUAL DIFFERENTIAL
BAND %: 7 % — ABNORMAL HIGH (ref 0–6)
LYMPHOCYTE %: 11 % — ABNORMAL LOW (ref 20–40)
LYMPHOCYTE ABSOLUTE: 0.67 x10ˆ3/uL
METAMYELOCYTE %: 5 %
METAMYELOCYTE ABSOLUTE: 0.31 x10ˆ3/uL
MONOCYTE %: 9 % (ref 2–11)
MONOCYTE ABSOLUTE: 0.55 x10ˆ3/uL
NEUTROPHIL %: 68 % (ref 50–70)
NEUTROPHIL ABSOLUTE: 4.58 x10ˆ3/uL
NUCLEATED RBC MANUAL: 3
PLATELET ESTIMATE: ADEQUATE
PLATELET ESTIMATE: ADEQUATE
WBC: 6.1 x10ˆ3/uL

## 2018-08-29 LAB — ECG 12 LEAD - ADULT
Calculated P Axis: INVALID deg
Calculated P Axis: 65 deg
Calculated T Axis: 211 deg
Calculated T Axis: 145 deg
EKG Severity: ABNORMAL
EKG Severity: ABNORMAL
Heart Rate: 133 {beats}/min
Heart Rate: 69 {beats}/min
I 40 Axis: 19 deg
I 40 Axis: 32 deg
PR Interval: INVALID ms
PR Interval: 162 ms
QRS Axis: 59 deg
QRS Axis: 49 deg
QRS Axis: 49 deg
QRS Duration: 80 ms
QRS Duration: 92 ms
QRS Duration: 92 ms
QT Interval: 291 ms
QT Interval: 410 ms
QTC Calculation: 433 ms
QTC Calculation: 440 ms
ST Axis: 216 deg
ST Axis: 151 deg
T 40 Axis: 92 deg
T 40 Axis: 73 deg

## 2018-08-29 LAB — BASIC METABOLIC PANEL
ANION GAP: 4 mmol/L
BUN/CREA RATIO: 10
BUN: 7 mg/dL — ABNORMAL LOW (ref 10–25)
CALCIUM: 8 mg/dL — ABNORMAL LOW (ref 8.8–10.3)
CHLORIDE: 112 mmol/L — ABNORMAL HIGH (ref 98–111)
CO2 TOTAL: 25 mmol/L (ref 21–35)
CREATININE: 0.7 mg/dL (ref <=1.30)
ESTIMATED GFR: >60 mL/min/1.73mˆ2
GLUCOSE: 108 mg/dL (ref 70–110)
POTASSIUM: 4.1 mmol/L (ref 3.5–5.0)
SODIUM: 141 mmol/L (ref 135–145)

## 2018-08-29 LAB — MAGNESIUM: MAGNESIUM: 1.7 mg/dL — ABNORMAL LOW (ref 1.8–2.3)

## 2018-08-29 MED ORDER — MAGNESIUM OXIDE 400 MG (241.3 MG MAGNESIUM) TABLET
400.00 mg | ORAL_TABLET | Freq: Two times a day (BID) | ORAL | Status: DC
Start: 2018-08-29 — End: 2018-08-29
  Filled 2018-08-29 (×3): qty 1

## 2018-08-29 MED ORDER — METOPROLOL TARTRATE 5 MG/5 ML INTRAVENOUS SOLUTION
5.00 mg | INTRAVENOUS | Status: DC | PRN
Start: 2018-08-29 — End: 2018-08-29
  Administered 2018-08-29 (×3): 5 mg via INTRAVENOUS
  Filled 2018-08-29 (×2): qty 5

## 2018-08-29 MED ORDER — OXYCODONE 5 MG TABLET
5.0000 mg | ORAL_TABLET | ORAL | 0 refills | Status: DC | PRN
Start: 2018-08-29 — End: 2018-09-10

## 2018-08-29 MED ORDER — AMIODARONE 200 MG TABLET
200.0000 mg | ORAL_TABLET | Freq: Two times a day (BID) | ORAL | 1 refills | Status: DC
Start: 2018-08-29 — End: 2018-09-11

## 2018-08-29 MED ORDER — SULFAMETHOXAZOLE 800 MG-TRIMETHOPRIM 160 MG TABLET
1.0000 | ORAL_TABLET | Freq: Two times a day (BID) | ORAL | 0 refills | Status: AC
Start: 2018-08-29 — End: 2018-08-31

## 2018-08-29 MED ORDER — DEXTROSE 5 % IN WATER (D5W) INTRAVENOUS SOLUTION
150.0000 mg | Freq: Once | INTRAVENOUS | Status: AC
Start: 2018-08-29 — End: 2018-08-29
  Administered 2018-08-29: 0 mg via INTRAVENOUS
  Administered 2018-08-29: 150 mg via INTRAVENOUS
  Filled 2018-08-29: qty 3

## 2018-08-29 MED ADMIN — nystatin 100,000 unit/mL oral suspension: @ 14:00:00

## 2018-08-29 MED ADMIN — albuterol sulfate 2.5 mg/3 mL (0.083 %) solution for nebulization: RESPIRATORY_TRACT | @ 09:00:00

## 2018-08-29 MED ADMIN — nystatin 100,000 unit/gram topical cream: TOPICAL | @ 10:00:00

## 2018-08-29 MED ADMIN — sodium chloride 0.45 % intravenous solution: ORAL | @ 10:00:00 | NDC 00338004304

## 2018-08-29 MED ADMIN — lactated Ringers intravenous solution: INTRAVENOUS | @ 03:00:00 | NDC 00264775000

## 2018-08-29 NOTE — Ancillary Notes (Signed)
Medical Nutrition Therapy Assessment        SUBJECTIVE : Pt admitted for neutropenic fever.  Pt with history of Stg III colon cancer and new diagnosis of Stg III lung cancer.  Pt was transferred from 6s to 6n due to episode a AFIB.  Reports feeling better and although still having some pain, reports she is eating better.  Diet has been advance to Regular.  Labs noted.  Pt was concerned about having home breathing treatments set up that she reports was to receive after last admission.  RD notified SS.  Pt instructed to notify Clinical Navigator, Jinny Blossom, if she develops any nutrition needs post discharge and Jinny Blossom can notify this RD.      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      Nutrition Diagnosis: Swallowing difficulty related to Current medical condition as evidenced by Unintentional weight loss     Kelsey [name], RDLD  08/29/2018, 08:33      Pager # [phone]

## 2018-08-29 NOTE — Discharge Summary (Signed)
Family Medicine   [address] [name] [address]  Dept: [phone]  Dept Fax: [phone]    DISCHARGE SUMMARY      Date of Service:  08/29/2018  Kelsey [name], 68 y.o. female  Date of Birth:  Sep 17, 1950  PCP: [facility]    ADMISSION DATE:  08/25/2018  DISCHARGE DATE:  08/29/2018    ATTENDING PHYSICIAN: Dr. Loma Sousa  PRIMARY CARE PHYSICIAN: [facility]     PRIMARY ADMISSION DIAGNOSIS: Neutropenic fever (CMS [place])    DISCHARGE DIAGNOSES:   Active Hospital Problems    Diagnosis Date Noted   . Principle Problem: Neutropenic fever (CMS HCC) [D70.9, R50.81] 08/07/2018   . Atrial fibrillation with RVR (CMS HCC) [I48.91] 08/29/2018      Resolved Hospital Problems   No resolved problems to display.     Active Non-Hospital Problems    Diagnosis Date Noted   . Asthma 08/09/2018   . Malignant neoplasm of lung, unspecified laterality, unspecified part of lung (CMS [place]) 06/26/2018   . Right lower lobe lung mass 03/22/2018   . Colon cancer (CMS [place]) 07/06/2014   . HTN (hypertension)    . Hyperlipidemia    . Obesity    . Arthropathy, unspecified, site unspecified    . COPD (chronic obstructive pulmonary disease) (CMS HCC)    . Ventral hernia         DISCHARGE MEDICATIONS:     Current Discharge Medication List      START taking these medications.      Details   amiodarone 200 mg Tablet  Commonly known as:  PACERONE   200 mg, Oral, 2 TIMES DAILY  Qty:  60 Tab  Refills:  1     oxyCODONE 5 mg Tablet  Commonly known as:  ROXICODONE   5 mg, Oral, EVERY 4 HOURS PRN  Qty:  12 Tab  Refills:  0     trimethoprim-sulfamethoxazole 800-160 mg Tablet  Commonly known as:  BACTRIM DS   160 mg, Oral, 2 TIMES DAILY  Qty:  3 Tab  Refills:  0        CONTINUE these medications - NO CHANGES were made during your visit.      Details   acetaminophen 325 mg Tablet  Commonly known as:  TYLENOL   650 mg, Oral, EVERY 6 HOURS PRN  Refills:  0     albuterol sulfate 2.5 mg /3 mL (0.083 %) Solution for  Nebulization  Commonly known as:  PROVENTIL   2.5 mg, Nebulization, EVERY 6 HOURS  Qty:  30 Each  Refills:  3     apixaban 5 mg Tablet  Commonly known as:  ELIQUIS   5 mg, Oral, 2 TIMES DAILY  Refills:  0     atorvastatin 10 mg Tablet  Commonly known as:  LIPITOR   10 mg, Oral, DAILY  Refills:  0     budesonide-formoteroL 160-4.5 mcg/actuation HFA Aerosol Inhaler  Commonly known as:  SYMBICORT   2 Puffs, Inhalation, 2 TIMES DAILY  Refills:  0     CARVEDILOL ORAL   6.25 mg, Oral, 2 TIMES DAILY, 3 tablets  Refills:  0     docusate sodium 100 mg Capsule  Commonly known as:  COLACE   100 mg, Oral, 3 TIMES DAILY PRN  Refills:  0     DULoxetine 30 mg Capsule, Delayed Release(E.C.)  Commonly known as:  CYMBALTA DR   30 mg, Oral, DAILY  Refills:  0     Linzess 72 mcg Capsule  Generic drug:  linaCLOtide   72 mcg, Oral, EVERY MORNING  Refills:  0     loperamide 2 mg Capsule  Commonly known as:  IMODIUM   2 mg, Oral, EVERY 4 HOURS PRN  Refills:  0     magnesium oxide 400 mg Tablet  Commonly known as:  MAG-OX   400 mg, Oral, DAILY  Refills:  0     omeprazole 20 mg Capsule, Delayed Release(E.C.)  Commonly known as:  PRILOSEC   40 mg, Oral, DAILY  Refills:  0     potassium chloride 20 mEq Tab Sust.Rel. Particle/Crystal  Commonly known as:  K-DUR  Notes to patient:  Do not take until after bactrim is completed   20 mEq, Oral, DAILY  Refills:  0     prochlorperazine 10 mg Tablet  Commonly known as:  Compazine   10 mg, Oral, 4 TIMES DAILY PRN  Qty:  60 Tab  Refills:  5     rOPINIRole 0.5 mg Tablet  Commonly known as:  REQUIP   0.5 mg, Oral, NIGHTLY  Refills:  0        STOP taking these medications.    cephalexin 500 mg Capsule  Commonly known as:  KEFLEX     flecainide 50 mg Tablet  Commonly known as:  TAMBOCOR            DISCHARGE INSTRUCTIONS:      DISCHARGE INSTRUCTION - DIET     Diet: RESUME HOME DIET      DISCHARGE INSTRUCTION - MISC    Please do not take K-DUR potassium supplement while taking Bactrim antibiotic     PLEASE  KEEP FOLLOW-UP APPT ALREADY SCHEDULED IN [facility]    Please keep your appointment with your follow-up provider Dr. Deatra Canter, 09/01/18, at 9:00 am. If for some reason you need to cancel, be sure to reschedule as this is important for your care.     [facility] [[id number]]      SCHEDULE FOLLOW-UP - CARDIOLOGY - [place]     Follow-up in: [place]    Reason for visit: HOSPITAL DISCHARGE    Follow-up reason: Atrial Fib        REASON FOR HOSPITALIZATION AND HOSPITAL COURSE:  This is a 68 y.o. female with medical history of stage III lung adenocarcinoma currently on chemoradiation who was admitted from [facility] for neutropenic fever.  She initially had an [place] of 700. She was put on cefepime and blood cultures and urine cultures were drawn.  Blood cultures were negative, urine cultures were growing Klebsiella.  Secondary to urine culture sensitivities, patient was transitioned from cefepime to Bactrim.  While admitted patient heart rate went into the 160s and patient was determined to be in AFib with RVR.  Cardiology was consulted, she was initially put on a Cardizem drip however cardiology change this to an IV amiodarone drip.  She was eventually transitioned off of the amiodarone drip to p.o. Amiodarone.  Heart rate has been well-controlled on p.o. Amiodarone. She did have significant pain associated with her cancer diagnosis requiring opioid therapy inpatient. She will be discharged on oxycodone with enough pills to get her through the her Oncology appointment in 3 days. She was deemed medically stable for discharge and will follow up her PCP, cardiology, and oncology outpatient.     PHYSICAL EXAM AT DISCHARGE:   Temperature: 36.7 C (98 F)  Heart Rate:  77  BP (Non-Invasive): (!) 155/91  Respiratory Rate: 18  SpO2: 99 %  Pain Score (Numeric, Faces): 0  General: appears chronically ill  Eyes: Pupils equal and round, reactive to light and accomodation.   HEENT:ENMT without erythema or injection, mucous  membranes moist.  Neck: no thyromegaly or lymphadenopathy  Lungs: Clear to auscultation bilaterally.   Cardiovascular: regular rate and rhythm  Abdomen: Soft, non-tender, Bowel sounds normal  Extremities: No cyanosis or edema  Skin: Skin warm and dry  Neurologic: CN II - XII grossly intact   Lymphatics: No lymphadenopathy  Psychiatric: Normal    SIGNIFICANT LAB: ANC recovered from 700 to 4000. Bcx negative, UCx growing klebsiella sensitive to bactrim    SIGNIFICANT RADIOLOGY: CXR negative    CONSULTATIONS: Cardiology, Oncology    PROCEDURES PERFORMED: No major procedures performed.    CONDITION ON DISCHARGE: Alert, Oriented and VS Stable    DISCHARGE DISPOSITION:  Home discharge     ISSUES FOR OUTPATIENT F/U:   Transition of Care  1. Ensure no more fevers  2. Follow up with cardiology and oncology  3. Completion of antibiotic course    I spent more than 30 minutes discharging this patient, including final assessment, diagnosis and treatment of the patient; discussion of hospital course and discharge instructions with the patient and/or family; and arrangements for follow-up care, including appointments, needed prescriptions, referrals, and/or preparation of discharge records.      cc: Primary Care Physician:  Vivian  [address]  [place] [address]     WL:[id number] Physician:  Antoine Primas, [place]  [place]  [address]  [place], [place] [address]     Lovette Cliche, [place] 1/10/202015:07  [facility]           08/30/2018  ________________________________________________________________________    I personally saw and evaluated the patient on the day of discharge.   I discussed the patient's case and management with the family medicine team.   I have reviewed the resident's discharge summary and agree with the findings and plan of care as documented.  Any exceptions/additions are edited/noted.    [place], [place] 08/30/2018, 09:33  Faculty  Physician  [facility]  Family Medicine Residency

## 2018-08-29 NOTE — Progress Notes (Signed)
[place] [address]     Phone: [phone]  Fax: [facility] NOTE     Patient Name: Kelsey [name]  Date of service: 08/29/2018  Date of Admission:  08/25/2018  Hospital Day:  LOS: 4 days     SUMMARY     Kelsey [name] is a 68 y.o. old female, with Stage III adenocarcinoma of lung admitted for febrile neutropenia. She was started on IV abx, awaiting blood and urine cultures. UA shows UTI. While admited she became tachycardic and was determeined to be in Afib with RVR. Dr. Reece Levy consulted and started patient on IV Amiodarone and holding flecainide. She has been transitioned to PO Amiodarone.    SUBJECTIVE     Patient seen and examined. Overnight she had an episode of Afib with RVR with HR in 160s. Nursing paged Cardiology and received orders for bolus of amiodarone and metoprolol 1mg  PRN. HR has been well controlled since. She is anxious to go home.     Diet - regular  Code Status - Full Code    Review of systems:   Review of Systems   Constitutional: Negative for activity change, chills and fever.   HENT: Negative for congestion, hearing loss, sneezing and sore throat.    Eyes: Negative for pain, discharge and visual disturbance.   Respiratory: Negative for cough, chest tightness, shortness of breath and wheezing.    Cardiovascular: Negative for chest pain, palpitations and leg swelling.   Gastrointestinal: Negative for abdominal distention, abdominal pain, constipation, diarrhea, nausea and vomiting.   Genitourinary: Negative for dysuria, frequency and urgency.   Musculoskeletal: Negative for arthralgias and back pain.        Pain RUQ to RL Thoracic, epigastric    Skin: Negative for rash.   Neurological: Negative for dizziness, syncope, light-headedness and headaches.   Psychiatric/Behavioral: Negative for self-injury, sleep disturbance and suicidal ideas.       OBJECTIVE     Vital Signs:  BP (!) 150/91   Pulse (!) 136   Temp 36.8 C  (98.2 F)   Resp 18   Ht 1.575 m (5' 2.01")   Wt 126.4 kg (278 lb 9.6 oz)   SpO2 94%   BMI 50.94 kg/m         Physical Exam:  GEN:  Patient is alert, cooperative, in no acute distress   HEENT: Normocephalic, atraumatic. Pupils equal, round and reactive. EOMI.  LUNGS: Non labored breathing. Clear to auscultation  CV: regular rhythm , no murmur  ABDOMEN: Soft, non-distended  SKIN: Warm and dry  NEURO: Awake, Alert and Oriented  EXTREMITIES:  Atraumatic, no edema or cyanosis     I/O:    Intake/Output Summary (Last 24 hours) at 08/29/2018 0610  Last data filed at 08/28/2018 1900  Gross per 24 hour   Intake 1030 ml   Output -   Net 1030 ml       Blood Sugars:   No results for input(s): GLUIP in the last 48 hours.     LABS & IMAGING     CBC Results    Recent Labs     08/26/18  0727 08/27/18  0547 08/28/18  0536   WBC 1.3* 2.5*  2.5 4.8  4.8   HGB 10.8* 8.9* 9.0*   HCT 32.2* 27.1* 26.7*   PLTCNT 137* 122* 129*   BANDS  --  17* 10*  BMP Results    Recent Labs     08/26/18  0727 08/27/18  0547 08/28/18  0536   SODIUM 138 137 139   POTASSIUM 3.5 3.5 3.7   CHLORIDE 106 109 110   CO2 25 25 24    BUN 9* 8* 8*   CREATININE 0.67 0.74 0.70   GFR >60 >60 >60   ANIONGAP 7 3 5       Recent Labs     08/26/18  0727 08/27/18  0547 08/28/18  0536   CALCIUM 8.2* 7.8* 7.9*   MAGNESIUM 1.5* 1.9 1.8      Cardiac Results   Recent Labs     08/26/18  1101 08/26/18  1811 08/27/18  0154   UHCEASTTROPI 0.00 0.01 0.01      Liver/Pancrease Enzyme Results   No results for input(s): TOTALPROTEIN, ALBUMIN, PREALBUMIN, AST, ALT, ALKPHOS, LDH, AMYLASE, LIPASE in the last 72 hours.    Invalid input(s): GGT   Coagulation Studies    Recent Labs     08/26/18  0727   INR 1.62*      Lipid Panel Results    No results found for: CHOLESTEROL, HDLCHOL, LDLCHOL, LDLCHOLDIR, TRIG   Imaging Studies         Microbiology / Pathology      Hospital Encounter on 08/25/18 (from the past 96 hour(s))   ADULT ROUTINE BLOOD CULTURE, SET OF 2 BOTTLES (BACTERIA AND YEAST)      Collection Time: 08/25/18  1:57 PM   Culture Result Status    BLOOD CULTURE, ROUTINE No Growth 3 Days Preliminary   ADULT ROUTINE BLOOD CULTURE, SET OF 2 BOTTLES (BACTERIA AND YEAST)    Collection Time: 08/25/18  2:40 PM   Culture Result Status    BLOOD CULTURE, ROUTINE No Growth 3 Days Preliminary   URINE CULTURE    Collection Time: 08/25/18  2:50 PM   Culture Result Status    URINE CULTURE (A) Final     100000 Klebsiella aerogenes (formerly Enterobacter aerogenes)       Susceptibility    Klebsiella aerogenes (formerly Enterobacter aerogenes) -  (no method available)     Cefazolin >=64 Resistant      Cefepime <=1 Sensitive      Ceftriaxone 16 Intermediate      Ciprofloxacin <=0.25 Sensitive      Ertapenem <=0.5 Sensitive      Gentamicin <=1 Sensitive      Nitrofurantoin 64 Intermediate      Piperacillin/Tazobactam >=128 Resistant      Tobramycin <=1 Sensitive      Trimethoprim/Sulfamethoxazole <=20 Sensitive    URINE CULTURE,ROUTINE    Collection Time: 08/25/18  2:50 PM   Culture Result Status    URINE CULTURE (A) Final     100000 Klebsiella aerogenes (formerly Enterobacter aerogenes)       Susceptibility    Klebsiella aerogenes (formerly Enterobacter aerogenes) -  (no method available)     Cefazolin >=64 Resistant      Cefepime <=1 Sensitive      Ceftriaxone 16 Intermediate      Ciprofloxacin <=0.25 Sensitive      Ertapenem <=0.5 Sensitive      Gentamicin <=1 Sensitive      Nitrofurantoin 64 Intermediate      Piperacillin/Tazobactam >=128 Resistant      Tobramycin <=1 Sensitive      Trimethoprim/Sulfamethoxazole <=20 Sensitive    RESPIRATORY VIRUS PANEL BY BIOFIRE FILM ARRAY    Collection Time: 08/25/18  5:44 PM  Culture Result Status    ADENOVIRUS ARRAY Target Not Detected Final    CORONAVIRUS 229E Target Not Detected Final    CORONAVIRUS HKU1 Target Not Detected Final    CORONAVIRUS NL63 Target Not Detected Final    CORONAVIRUS OC43 Target Not Detected Final    METAPNEUMOVIRUS ARRAY Target Not Detected Final     RHINOVIRUS/ENTEROVIRUS ARRAY Target Not Detected Final    INFLUENZA A Target Not Detected Final    INFLUENZA B ARRAY Target Not Detected Final    PARAINFLUENZA 1 ARRAY Target Not Detected Final    PARAINFLUENZA 2 ARRAY Target Not Detected Final    PARAINFLUENZA 3 ARRAY Target Not Detected Final    PARAINFLUENZA 4 ARRAY Target Not Detected Final    RSV ARRAY Target Not Detected Final    BORDETELLA PERTUSSIS ARRAY Target Not Detected Final    CHLAMYDOPHILA PNEUMONIAE ARRAY Target Not Detected Final    MYCOPLASMA PNEUMONIAE ARRAY Target Not Detected Final    Narrative    Performed by Film Array PCR using the Biofire Respiratory Panel. The results of this test should not be used as the sole basis for diagnosis, treatment, or other management decisions.   Test developed and performance on this sample type validated at [facility] using an FDA cleared assay.       No results found for this visit on 08/25/18.     INPATIENT MEDICATIONS   albuterol (PROVENTIL) 2.5 mg / 3 mL (0.083%) neb solution, 2.5 mg, Nebulization, Q6H  amiodarone (CORDARONE) tablet, 200 mg, Oral, 2x/day  apixaban (ELIQUIS) tablet, 5 mg, Oral, 2x/day  atorvastatin (LIPITOR) tablet, 10 mg, Oral, Daily  budesonide-formoterol (SYMBICORT) 160 mcg-4.5 mcg per inhalation oral inhaler, 2 Puff, Inhalation, 2x/day  carvedilol (COREG) tablet 18.75 mg, 18.75 mg, Oral, 2x/day  docusate sodium (COLACE) capsule, 100 mg, Oral, 3x/day PRN  DULoxetine (CYMBALTA) delayed release capsule, 30 mg, Oral, Daily  linaclotide (LINZESS) capsule, 72 mcg, Oral, QAM  magnesium oxide (MAG-OX) tablet, 400 mg, Oral, Daily  metoprolol (LOPRESSOR) 1 mg/mL injection, 5 mg, Intravenous, Q1H PRN  morphine 2 mg/mL injection, 2 mg, Intravenous, Q3H PRN  NS flush syringe, 3 mL, Intracatheter, Q8HRS  NS flush syringe, 3 mL, Intracatheter, Q1H PRN  NS premix infusion, , Intravenous, Continuous  nystatin (MYCOSTATIN) 100,000 units per mL oral liquid, 5 mL, Swish & Swallow, 4x/day  nystatin  (MYCOSTATIN) 100,000 units/g topical cream, , Apply Topically, 2x/day  oxyCODONE (ROXICODONE) immediate release tablet, 5 mg, Oral, Q6H PRN  pantoprazole (PROTONIX) delayed release tablet, 40 mg, Oral, Daily before Breakfast  phenazopyridine (PYRIDIUM) tablet, 100 mg, Oral, 3x/day-Meals  potassium chloride (K-DUR) extended release tablet, 20 mEq, Oral, Daily  prochlorperazine (COMPAZINE) tablet, 10 mg, Oral, 4x/day PRN  trimethoprim-sulfamethoxazole (BACTRIM DS) 160-800mg  per tablet, 1 Tab, Oral, 2x/day        ASSESSMENT & PLAN     Active Hospital Problems    Diagnosis   . Neutropenic fever (CMS HCC)     Neutropenic Fever secondary to UTI  - Neutropenia resolved  - ANC 3.3->4.58 on 08/28/18  - Admission ANC 700  - received 1 dose neupogen 08/26/18  - Afebrile   - UCx growing 100000 Klebsiella, sensitive to Bactrim  - Completed 4 days of Cefepime, transitioned to PO Bactrim x3 days for total Abx course of 7 days.   - BCx NG  - CXR negative, biofire negative  - Oxycodone IR 5mg  Q6 PRN, Morphine 2mg  Q3 PRN Breakthrough  - Holding chemoradiation  - Oncology, Dr. Deatra Canter, consulted,  recommended neupogen    Atrial Fibrillation with RVR  - Had episode on RVR last night, given bolus of amiodarone with resolution of tachycardia  - Troponin negative   - Transitioned to PO Amiodarone, tolerating well.   - Cardiology, Dr. Reece Levy, following   - On Eliquis 5mg  BID    Adenocarcinoma of Lung, Stage III  - Lung adenocarcinoma positive for CK 7 and TTF 1, negative for P 63 and CK 20  - Started chemoradiation on July 23, 2018, last tx 08/21/18  - Chemo regimen Paclitaxel/Carboplatin  - Holding chemoradiation  - Patient also has history of stage IIIB, T3, N1a, M0, low-grade  sigmoid cancer in 2015    COPD  - SOB improved   - CXR showed no acute infiltrate  - Continue home Symbicort BID, albuterol PRN    Hyperlipidemia  - Continue Lipitor 10mg     Chemo Induced Nausea and Vomiting  - Continue compazine 10mg  4x/day PRN    Chronic  Constipation  - Continue home Linzess, colace    HypoMg/P  - resolved       PT/OT: No  DVT/PE Prophylaxis: Apixaban  DNR Status:  Full Code  Consults: Oncology      Discharge Planning:   Home pending Cardiology recommendations. Will go with 2 more days Abx, follow up in [facility] on 1/13    Lovette Cliche, D.O. 08/29/2018 06:13  [address]  [place], [place] [address]  Phone: [phone] / Fax: [phone]          08/29/2018  ________________________________________________________________________    I personally saw and evaluated the patient.  I have discussed the patient's case and management with the family medicine team.   I have reviewed the resident's progress note and agree with the findings and plan of care as documented.  Any exceptions/additions are edited/noted.  Doing well, wants to go home today.  Medically stable for discharge, follow-up with cardiology, oncology and primary care physician for hospital discharge.    [place], [place] 08/29/2018, 15:13  Faculty Physician  [facility]  Family Medicine Residency

## 2018-08-29 NOTE — Care Management Notes (Addendum)
MSW alerted by nurse that patient did not receive nebulizer from [facility] at prior admission 2 weeks ago. MSW researched this and determined that the referral was sent to [facility] in [place]. MSW called local [place] office at [phone] and determined that they did not receive any info and have no account for this patient. MSW resent the info via [employer] to [place] [facility]) for delivery. Met with patient and updated her on this. She verbalized understanding. Edwyna Ready, SOCIAL WORKER  08/29/2018, 09:26    Addendum: CMN signed and returned to [place]. Patient to pick up nebulizer and stated she has supplies at Pharmacy ready for pick up. MSW provided location for pick up to husband and patient. Edwyna Ready, SOCIAL WORKER  08/29/2018, 13:05

## 2018-08-29 NOTE — Care Plan (Signed)
Problem: Adult Inpatient Plan of Care  Goal: Plan of Care Review  Outcome: Ongoing (see interventions/notes)  Goal: Patient-Specific Goal (Individualization)  Outcome: Ongoing (see interventions/notes)  Goal: Absence of Hospital-Acquired Illness or Injury  Outcome: Ongoing (see interventions/notes)  Goal: Optimal Comfort and Wellbeing  Outcome: Ongoing (see interventions/notes)  Goal: Rounds/Family Conference  Outcome: Ongoing (see interventions/notes)     Problem: Infection  Goal: Infection Symptom Resolution  Outcome: Ongoing (see interventions/notes)     Problem: Fever (Fever with Neutropenia)  Goal: Baseline Body Temperature  Outcome: Ongoing (see interventions/notes)     Problem: Infection Risk (Fever with Neutropenia)  Goal: Absence of Infection  Outcome: Ongoing (see interventions/notes)     Problem: Fall Injury Risk  Goal: Absence of Fall and Fall-Related Injury  Outcome: Ongoing (see interventions/notes)      Patient on telemetry monitoring overnight- had episode of high rate afib. Afib confirmed with EKG- dr Pablo Ledger on call paged, amiodorone iv bolus and iv metopolol orders obtained. Medications given as ordered and patient converted back to sinus rhythm in the 60s- confimred with ekg. Patient was symptomatic with afib- felt short of breath and reported feeling heart beating fast. Patient remains independent and fluids running through port. Port does not draw. Thea Silversmith, RN  08/29/2018, 05:37

## 2018-08-29 NOTE — Consults (Signed)
[facility]  Cardiology   Progress Note      Kelsey [name], Kelsey [name]  Date of Admission:  08/25/2018  Date of service: 08/29/2018  Date of Birth:  1950-12-02  MRN:  [id number]  Hospital Day:  LOS: 4 days     Subjective:     Patient says she is feeling good breath patient thinks she had bad night last night.  She was a little weak.  She refused radiation.  She like to go home she thinks she is going to be better at home for denies any chest pain, PND, orthopnea    Objective:     Vital Signs:  Filed Vitals:    08/28/18 1927 08/28/18 2234 08/29/18 0210 08/29/18 0310   BP: (!) 170/79 (!) 177/96 (!) 143/89 (!) 150/91   Pulse: 75 77 (!) 144 (!) 136   Resp: 18 20  18    Temp: 36.8 C (98.2 F) 36.9 C (98.4 F)  36.8 C (98.2 F)   SpO2: 98% 98%  94%       Physical Exam:    General: Patient not in any acute distress.   HEENT: No JVD, No carotid bruit b/l  Lungs: Clear to auscultation and percussion.  Cardiovascular: Heart sounds are regular, systolic murmur.  [place]  Extremities: No edema, good peripheral pulses.  Neurology: Alert, awake and oriented x3     Telemetry reviewed:  Normal rhythm 70 80    Labs:  No results found for: CHOLESTEROL, HDLCHOL, LDLCHOL, LDLCHOLDIR, TRIG  CBC Results   Recent Labs     08/26/18  0727 08/27/18  0547 08/28/18  0536   WBC 1.3* 2.5*  2.5 4.8  4.8   HGB 10.8* 8.9* 9.0*   HCT 32.2* 27.1* 26.7*   PLTCNT 137* 122* 129*   BANDS  --  17* 10*      BMP Results   Recent Labs     08/26/18  0727 08/27/18  0547 08/28/18  0536   SODIUM 138 137 139   POTASSIUM 3.5 3.5 3.7   CHLORIDE 106 109 110   CO2 25 25 24    BUN 9* 8* 8*   CREATININE 0.67 0.74 0.70   GFR >60 >60 >60   ANIONGAP 7 3 5      Recent Labs     08/26/18  0727 08/27/18  0547 08/28/18  0536   CALCIUM 8.2* 7.8* 7.9*   MAGNESIUM 1.5* 1.9 1.8      Coag Results   Recent Labs     08/26/18  0727   INR 1.62*      Cardiac Results      Recent Labs     08/26/18  1101 08/26/18  1811 08/27/18  0154   UHCEASTTROPI 0.00 0.01 0.01        Imaging:  XR AP MOBILE CHEST   Final Result      Right lower lobe mass has a similar appearance. No acute   infiltrate.            Radiologist location ID: [id number]             Current Medications:  albuterol (PROVENTIL) 2.5 mg / 3 mL (0.083%) neb solution, 2.5 mg, Nebulization, Q6H  amiodarone (CORDARONE) tablet, 200 mg, Oral, 2x/day  apixaban (ELIQUIS) tablet, 5 mg, Oral, 2x/day  atorvastatin (LIPITOR) tablet, 10 mg, Oral, Daily  budesonide-formoterol (SYMBICORT) 160 mcg-4.5 mcg per inhalation oral inhaler, 2 Puff, Inhalation, 2x/day  carvedilol (COREG) tablet 18.75 mg, 18.75 mg, Oral, 2x/day  docusate sodium (  COLACE) capsule, 100 mg, Oral, 3x/day PRN  DULoxetine (CYMBALTA) delayed release capsule, 30 mg, Oral, Daily  linaclotide (LINZESS) capsule, 72 mcg, Oral, QAM  magnesium oxide (MAG-OX) tablet, 400 mg, Oral, Daily  metoprolol (LOPRESSOR) 1 mg/mL injection, 5 mg, Intravenous, Q1H PRN  morphine 2 mg/mL injection, 2 mg, Intravenous, Q3H PRN  NS flush syringe, 3 mL, Intracatheter, Q8HRS  NS flush syringe, 3 mL, Intracatheter, Q1H PRN  NS premix infusion, , Intravenous, Continuous  nystatin (MYCOSTATIN) 100,000 units per mL oral liquid, 5 mL, Swish & Swallow, 4x/day  nystatin (MYCOSTATIN) 100,000 units/g topical cream, , Apply Topically, 2x/day  oxyCODONE (ROXICODONE) immediate release tablet, 5 mg, Oral, Q6H PRN  pantoprazole (PROTONIX) delayed release tablet, 40 mg, Oral, Daily before Breakfast  phenazopyridine (PYRIDIUM) tablet, 100 mg, Oral, 3x/day-Meals  potassium chloride (K-DUR) extended release tablet, 20 mEq, Oral, Daily  prochlorperazine (COMPAZINE) tablet, 10 mg, Oral, 4x/day PRN  trimethoprim-sulfamethoxazole (BACTRIM DS) 160-800mg  per tablet, 1 Tab, Oral, 2x/day      Allergies   Allergen Reactions   . Shellfish Containing Products Shortness of Breath, Rash and Itching     scallops   . Vancomycin Shortness of Breath   . Barium Iodide    . Iodine And Iodide Containing Products      Itching, shortness of breath        Assessment/ Plan:    Atrial  fibrillation, paroxysmal patient is in normal sinus rhythm she is on amiodarone.  Patient like to go home badly  Plan stay on amiodarone, Eliquis call if any bleeding if she goes home follow with me 1 week after discharge    Lung CA stage III patient refuse radiation  Plan as per Dr. oncologist    Riley Churches, MD

## 2018-08-30 LAB — ADULT ROUTINE BLOOD CULTURE, SET OF 2 BOTTLES (BACTERIA AND YEAST)
BLOOD CULTURE, ROUTINE: NO GROWTH
BLOOD CULTURE, ROUTINE: NO GROWTH
BLOOD CULTURE, ROUTINE: NO GROWTH

## 2018-09-01 ENCOUNTER — Inpatient Hospital Stay (HOSPITAL_COMMUNITY)
Admission: RE | Admit: 2018-09-01 | Discharge: 2018-09-01 | Disposition: A | Discharge status: Still In Hospital | Payer: Medicare Other | Source: Ambulatory Visit

## 2018-09-01 ENCOUNTER — Ambulatory Visit (HOSPITAL_COMMUNITY)
Admission: RE | Admit: 2018-09-01 | Discharge: 2018-09-01 | Disposition: A | Discharge status: Home / Self Care | Payer: Medicare Other | Source: Ambulatory Visit

## 2018-09-01 ENCOUNTER — Encounter (HOSPITAL_COMMUNITY): Payer: Self-pay

## 2018-09-01 ENCOUNTER — Encounter (HOSPITAL_COMMUNITY): Payer: Self-pay | Admitting: Internal Medicine

## 2018-09-01 ENCOUNTER — Ambulatory Visit (HOSPITAL_BASED_OUTPATIENT_CLINIC_OR_DEPARTMENT_OTHER): Payer: Medicare Other | Admitting: Internal Medicine

## 2018-09-01 ENCOUNTER — Telehealth (HOSPITAL_COMMUNITY): Payer: Self-pay

## 2018-09-01 VITALS — BP 138/68 | HR 68 | Temp 95.2°F | Ht 62.0 in | Wt 280.0 lb

## 2018-09-01 VITALS — BP 128/57 | HR 65 | Temp 97.5°F | Resp 18 | Ht 62.0 in | Wt 288.0 lb

## 2018-09-01 DIAGNOSIS — Z7901 Long term (current) use of anticoagulants: Secondary | ICD-10-CM | POA: Insufficient documentation

## 2018-09-01 DIAGNOSIS — G4730 Sleep apnea, unspecified: Secondary | ICD-10-CM | POA: Insufficient documentation

## 2018-09-01 DIAGNOSIS — T451X5A Adverse effect of antineoplastic and immunosuppressive drugs, initial encounter: Secondary | ICD-10-CM

## 2018-09-01 DIAGNOSIS — Z79899 Other long term (current) drug therapy: Secondary | ICD-10-CM | POA: Insufficient documentation

## 2018-09-01 DIAGNOSIS — C7801 Secondary malignant neoplasm of right lung: Secondary | ICD-10-CM | POA: Insufficient documentation

## 2018-09-01 DIAGNOSIS — Z87891 Personal history of nicotine dependence: Secondary | ICD-10-CM | POA: Insufficient documentation

## 2018-09-01 DIAGNOSIS — I4891 Unspecified atrial fibrillation: Secondary | ICD-10-CM | POA: Insufficient documentation

## 2018-09-01 DIAGNOSIS — I10 Essential (primary) hypertension: Secondary | ICD-10-CM | POA: Insufficient documentation

## 2018-09-01 DIAGNOSIS — C781 Secondary malignant neoplasm of mediastinum: Secondary | ICD-10-CM | POA: Insufficient documentation

## 2018-09-01 DIAGNOSIS — J449 Chronic obstructive pulmonary disease, unspecified: Secondary | ICD-10-CM | POA: Insufficient documentation

## 2018-09-01 DIAGNOSIS — Z85038 Personal history of other malignant neoplasm of large intestine: Secondary | ICD-10-CM | POA: Insufficient documentation

## 2018-09-01 DIAGNOSIS — C3490 Malignant neoplasm of unspecified part of unspecified bronchus or lung: Secondary | ICD-10-CM

## 2018-09-01 DIAGNOSIS — E785 Hyperlipidemia, unspecified: Secondary | ICD-10-CM | POA: Insufficient documentation

## 2018-09-01 DIAGNOSIS — Z5111 Encounter for antineoplastic chemotherapy: Secondary | ICD-10-CM | POA: Insufficient documentation

## 2018-09-01 DIAGNOSIS — E669 Obesity, unspecified: Secondary | ICD-10-CM | POA: Insufficient documentation

## 2018-09-01 DIAGNOSIS — R112 Nausea with vomiting, unspecified: Secondary | ICD-10-CM | POA: Insufficient documentation

## 2018-09-01 DIAGNOSIS — C3431 Malignant neoplasm of lower lobe, right bronchus or lung: Secondary | ICD-10-CM | POA: Insufficient documentation

## 2018-09-01 LAB — COMPREHENSIVE METABOLIC PANEL, NON-FASTING
ALBUMIN: 3.5 g/dL (ref 3.2–4.6)
ALKALINE PHOSPHATASE: 84 U/L (ref 20–130)
ALT (SGPT): 19 U/L (ref <=52)
ANION GAP: 8 mmol/L
AST (SGOT): 25 U/L (ref <=35)
BILIRUBIN TOTAL: 0.5 mg/dL (ref 0.3–1.2)
BUN/CREA RATIO: 16
BUN: 12 mg/dL (ref 10–25)
CALCIUM: 8.5 mg/dL — ABNORMAL LOW (ref 8.8–10.3)
CHLORIDE: 107 mmol/L (ref 98–111)
CO2 TOTAL: 24 mmol/L (ref 21–35)
CREATININE: 0.76 mg/dL (ref <=1.30)
ESTIMATED GFR: >60 mL/min/1.73mˆ2
GLUCOSE: 175 mg/dL — ABNORMAL HIGH (ref 70–110)
POTASSIUM: 3.5 mmol/L (ref 3.5–5.0)
PROTEIN TOTAL: 5.4 g/dL — ABNORMAL LOW (ref 6.0–8.3)
SODIUM: 139 mmol/L (ref 135–145)

## 2018-09-01 LAB — CBC WITH DIFF
BASOPHIL #: 0 x10ˆ3/uL (ref 0.00–0.20)
BASOPHIL %: 1 %
EOSINOPHIL #: 0 10*3/uL (ref 0.00–0.50)
EOSINOPHIL %: 0 %
HCT: 30.8 % — ABNORMAL LOW (ref 34.6–46.2)
HGB: 10.1 g/dL — ABNORMAL LOW (ref 11.8–15.8)
LYMPHOCYTE #: 0.5 x10ˆ3/uL — ABNORMAL LOW (ref 0.90–3.40)
LYMPHOCYTE %: 13 %
MCH: 26.5 pg — ABNORMAL LOW (ref 27.6–33.2)
MCH: 26.5 pg — ABNORMAL LOW (ref 27.6–33.2)
MCHC: 32.8 g/dL (ref 32.6–35.4)
MCV: 80.9 fL — ABNORMAL LOW (ref 82.3–96.7)
MONOCYTE #: 0.7 x10ˆ3/uL (ref 0.20–0.90)
MONOCYTE %: 18 %
MONOCYTE %: 18 %
MPV: 7.8 fL (ref 6.6–10.2)
NEUTROPHIL #: 2.6 x10ˆ3/uL (ref 1.50–6.40)
NEUTROPHIL %: 68 %
PLATELETS: 162 x10ˆ3/uL (ref 140–440)
RBC: 3.8 x10ˆ6/uL (ref 3.80–5.24)
RDW: 16.9 % — ABNORMAL HIGH (ref 12.4–15.2)
WBC: 3.8 10*3/uL (ref 3.5–10.3)

## 2018-09-01 MED ORDER — SODIUM CHLORIDE 0.9 % INTRAVENOUS SOLUTION
20.0000 mg | Freq: Once | INTRAVENOUS | Status: AC
Start: 2018-09-01 — End: 2018-09-01
  Administered 2018-09-01: 20 mg via INTRAVENOUS
  Administered 2018-09-01: 0 mg via INTRAVENOUS
  Filled 2018-09-01: qty 5

## 2018-09-01 MED ORDER — DIPHENHYDRAMINE 50 MG/ML INJECTION SOLUTION
50.0000 mg | Freq: Once | INTRAMUSCULAR | Status: DC
Start: 2018-09-01 — End: 2018-09-01

## 2018-09-01 MED ORDER — SODIUM CHLORIDE 0.9 % INTRAVENOUS SOLUTION
Freq: Once | INTRAVENOUS | Status: AC
Start: 2018-09-01 — End: 2018-09-01
  Filled 2018-09-01: qty 2

## 2018-09-01 MED ORDER — SODIUM CHLORIDE 0.9 % INTRAVENOUS SOLUTION
235.8000 mg | Freq: Once | INTRAVENOUS | Status: AC
Start: 2018-09-01 — End: 2018-09-01
  Administered 2018-09-01: 0 mg via INTRAVENOUS
  Administered 2018-09-01: 235 mg via INTRAVENOUS
  Filled 2018-09-01: qty 23.5

## 2018-09-01 MED ORDER — SODIUM CHLORIDE 0.9 % INTRAVENOUS SOLUTION
45.0000 mg/m2 | Freq: Once | INTRAVENOUS | Status: AC
Start: 2018-09-01 — End: 2018-09-01
  Administered 2018-09-01: 108 mg via INTRAVENOUS
  Administered 2018-09-01: 0 mg via INTRAVENOUS
  Filled 2018-09-01: qty 18

## 2018-09-01 MED ORDER — PALONOSETRON 0.25 MG/5 ML INTRAVENOUS SOLUTION
0.2500 mg | Freq: Once | INTRAVENOUS | Status: AC
Start: 2018-09-01 — End: 2018-09-01
  Administered 2018-09-01: 0.25 mg via INTRAVENOUS
  Filled 2018-09-01: qty 5

## 2018-09-01 MED ORDER — FAMOTIDINE (PF) 20 MG/2 ML INTRAVENOUS SOLUTION
20.0000 mg | Freq: Once | INTRAVENOUS | Status: DC
Start: 2018-09-01 — End: 2018-09-01

## 2018-09-01 NOTE — Nursing Note (Signed)
Navigator: Pt will still be getting tx thru 09/16/18. She has been getting assistance from [facility] in 2019 with two gas cards a week. I filled a [id number] application and issued her two gas cards for this week.

## 2018-09-01 NOTE — Telephone Encounter (Signed)
Opened in error

## 2018-09-01 NOTE — Nurses Notes (Signed)
1015 right chest port flushed with 10 ml NS, -BR. No pain or swelling at site. Pepcid/benadryl started to infuse over 32mis. Tobias Alexander, RN  1031 pepcid/benadryl complete. Port flushed with 72ml NS, -BR.Tobias Alexander, RN  [phone] Aloxi given over 30sec. Port flushed with 92ml NS, -BR. Decadron started to infuse over 19mins. Tobias Alexander, RN  [phone] Decadron complete. Port flushed with 30ml NS, -BR. Taxol started to infuse over one hour. Tobias Alexander, RN  [phone] Taxol complete. Port flushed with 10 ml NS,-BR. Carboplatin started to infuse over 34mins. Tobias Alexander, RN  1320 Carboplatin complete. Pt denies complaint. Port flushed with 75ml NS, -BR and heparinized with 89ml of Heparin. no pain or swelling at site. Port deaccessed and dressed with bandaid. Pt left via wheelchair with husband. Tobias Alexander, RN

## 2018-09-01 NOTE — Progress Notes (Signed)
3420 rad  Was off last week  Looks much better today  Started back on ctx  No c/o 2ry xrt  Resume xrt

## 2018-09-01 NOTE — Progress Notes (Signed)
[place]  [place] [address]        Encounter Date: 09/01/2018   9:15 AM EST    Name:  Kelsey [name]  Age: 68 y.o.  DOB: May 06, 1951  Sex: female  PCP: Benjie Karvonen    Chief Complaint:    No chief complaint on file.    HISTORY OF PRESENT ILLNESS:   68 y.o. female with history of stage III colon cancer of present evaluation and management of lung cancer.    1. September 2015. Patient underwent sigmoidoscopy and was found to have polyp which was adenocarcinoma.    2. June 10, 2014. She was admitted for segmental resection of sigmoid colon. This revealed pT3, N1a, MX, low-grade  adenocarcinoma of the colon. The mass measuring 4 x 3 x 0.5 centimeters. It  was low-grade and there was no evidence of microsatellite instability by  histology. Margins were uninvolved and 4 lymph nodes only were removed which  only 1 was involved. This corresponding stage IIIB, T3, N1a, M0, low-grade  sigmoid cancer.     3. July 07, 2014. Recommended adjuvant chemotherapy with FOLFOX.    4. July 27, 2014. Patient was started on adjuvant chemotherapy with FOLFOX.  Patient received only 5 treatments and chemotherapy was stopped due to poor tolerance.  After that patient lost follow-up.      5. October 2019. Patient was admitted to hospital with concern for  bowel obstruction.  CT chest showed lung nodules.  Patient was managed conservatively and discharged home.    6. May 06, 2018.  Patient underwent CT-guided biopsy of lung lesion.  Pathology showed well-differentiated adenocarcinoma.  Section shows well-differentiated adenocarcinoma which positive for CK 7 and TTF 1, negative for P 63 and CK 20.     7. June 23, 2018. PET scan showed Hypermetabolic right lower lobe pulmonary mass compatible with malignancy.  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 Hepatomegaly and steatosis.     8. July 23, 2018. Patient was started on chemoradiation.    SUBJECTIVE:  Patient is here for follow-up treatment.  Denies any further episode of fever after discharge from hospital.  Denies any worsening shortness of breath or chest pain.  No cough or congestion.  Denies any constipation or diarrhea.    REVIEW OF SYSTEMS:  GENERAL:  No fevers or chills.  HEENT: no sore throat, congestion, blurry vision  Lungs:  No shortness of breath, cough or congestion.  GI: no nausea, vomiting, constipation, no diarrhea  GU: No dysuria, urgency, increased frequency   Cardiac: no chest pains, palpitations, syncope,   Neuro: no weakness, loss of function, numbness, tingling  Skin: no rash  Musculoskeletal: No myalgias  Psychosocial: No depression, anxiety  Hematologic: No easy bruising, abnormal bleeding  All other review ROS negative except those mentioned above.     PATIENT HISTORY:  Past Medical History:   Diagnosis Date   . A-fib (CMS HCC)    . Arthropathy, unspecified, site unspecified    . Asthma    . Cancer (CMS HCC)     cervical   . Cataract    . Colon cancer (CMS [place]) 07/06/2014   . COPD (chronic obstructive pulmonary disease) (CMS  [place])    . Fistula    . HTN (hypertension)    . Hyperlipidemia    . Obesity    . Sleep apnea    . Ventral hernia        Past Surgical History:   Procedure Laterality Date   . ABCESS DRAINAGE     . BOWEL RESECTION     . CATARACT EXTRACTION     . HX CERVICAL CONE BIOPSY      . HX CESAREAN SECTION     . HX GASTRIC BYPASS      25 years ago   . [place] (x2)   . HX HYSTERECTOMY      late 1990s   . HX LAP BANDING      2013   . HX LAPAROTOMY      2013 to remove lap band       Family Medical History:     Problem Relation (Age of Onset)    Diabetes Mother, Maternal Grandmother, Maternal Grandfather, Father    Heart Attack Mother    Stroke Father          Current Outpatient Medications   Medication Sig   . acetaminophen (TYLENOL) 325 mg Oral Tablet Take 2 Tabs (650 mg total) by mouth Every 6  hours as needed (Patient not taking: Reported on 08/25/2018)   . albuterol sulfate (PROVENTIL) 2.5 mg /3 mL (0.083 %) Inhalation Solution for Nebulization 3 mL (2.5 mg total) by Nebulization route Every 6 hours   . amiodarone (PACERONE) 200 mg Oral Tablet Take 1 Tab (200 mg total) by mouth Twice daily   . apixaban (ELIQUIS) 5 mg Oral Tablet Take 1 Tab (5 mg total) by mouth Twice daily   . atorvastatin (LIPITOR) 10 mg Oral Tablet Take 10 mg by mouth Once a day   . budesonide-formoterol (SYMBICORT) 160-4.5 mcg/actuation Inhalation HFA Aerosol Inhaler Take 2 Puffs by inhalation Twice daily   . CARVEDILOL ORAL Take 6.25 mg by mouth Twice daily 3 tablets   . docusate sodium (COLACE) 100 mg Oral Capsule Take 100 mg by mouth Three times a day as needed    . DULoxetine (CYMBALTA DR) 30 mg Oral Capsule, Delayed Release(E.C.) Take 30 mg by mouth Once a day   . linaCLOtide (LINZESS) 72 mcg Oral Capsule Take 72 mcg by mouth Every morning   . loperamide (IMODIUM) 2 mg Oral Capsule Take 2 mg by mouth Every 4 hours as needed   . magnesium oxide (MAG-OX) 400 mg Oral Tablet Take 400 mg by mouth Once a day   . omeprazole (PRILOSEC) 20 mg Oral Capsule, Delayed Release(E.C.) Take 40 mg by mouth Once a day    . oxyCODONE (ROXICODONE) 5 mg Oral Tablet Take 1 Tab (5 mg total) by mouth Every 4 hours as needed for Pain   . potassium chloride (K-DUR) 20 mEq Oral Tab Sust.Rel. Particle/Crystal Take 20 mEq by mouth Once a day   . prochlorperazine (COMPAZINE) 10 mg Oral Tablet Take 1 Tab (10 mg total) by mouth Four times a day as needed for nausea/vomiting   . rOPINIRole (REQUIP) 0.5 mg Oral Tablet Take 0.5 mg by mouth Every night     Social History     Socioeconomic History   . Marital status: Married     Spouse name: Not on file   . Number of children: Not on file   . Years of education: Not on file   .  Highest education level: Not on file   Tobacco Use   . Smoking status: Former Research scientist (life sciences)   . Smokeless tobacco: Never Used   . Tobacco comment:  quit 10 yrs ago   Substance and Sexual Activity   . Alcohol use: Not Currently     Comment: rarely   . Drug use: No   Other Topics Concern   . Ability to Walk 1 Flight of Steps without SOB/CP No   . Ability To Do Own ADL's Yes       PHYSICAL EXAMINATION:  VITALS:   Vitals:    09/01/18 0928   BP: 138/68   Pulse: 68   Temp: 35.1 C (95.2 F)   SpO2: 97%   Weight: 127 kg (280 lb)   Height: 1.575 m (_0 )   BMI: 51.32       PHYSICAL EXAM:   Consitutional: No acute distress.    Eyes: EOMI. No discharge. No Jaundice.   ENT: mucous membranes dry. No posterior pharynx lesions.. Neck supple, No palpable masses   Heme/ Lymph: No Cervical, Inguinal, axillary lymh nodes. No Bruising.   Cardiovascular: S1, S2, No murmurs, rubs, or gallops.   Respiratory: Clear to auscultate and percuss B/L, No rhonchi, crackles, or wheezing.   Abdomen: Normal Bowel Sounds, nontender nondistended. No hepatosplenomegaly.   Musculoskeletal: No Edema to the extremities.   Skin: Normal turgor. No Rashes,skin lesions   Psychiatry: Normal Affect   Neuro: No focal deficits. Alert and Oriented x 3      ENCOUNTER ORDERS:  No orders of the defined types were placed in this encounter.      LABORATORY/RADIOLOGICAL DATA: All pertinent labs/radiology were reviewed.   Results for RADONNA, BRACHER (MRN [id number]) as of 09/01/2018 10:12   Ref. Range 09/01/2018 09:13   WBC Latest Ref Range: 3.5 - 10.3 x10^3/uL 3.8   HGB Latest Ref Range: 11.8 - 15.8 g/dL 10.1 (L)   HCT Latest Ref Range: 34.6 - 46.2 % 30.8 (L)   PLATELET COUNT Latest Ref Range: 140 - 440 x10^3/uL 162   RBC Latest Ref Range: 3.80 - 5.24 x10^6/uL 3.80   MCV Latest Ref Range: 82.3 - 96.7 fL 80.9 (L)   MCHC Latest Ref Range: 32.6 - 35.4 g/dL 32.8   MCH Latest Ref Range: 27.6 - 33.2 pg 26.5 (L)   RDW Latest Ref Range: 12.4 - 15.2 % 16.9 (H)   MPV Latest Ref Range: 6.6 - 10.2 fL 7.8   PMN'S Latest Units: % 68   LYMPHOCYTES Latest Units: % 13   EOSINOPHIL Latest Units: % 0   MONOCYTES Latest Units: % 18      BASOPHILS Latest Units: % 1   PMN ABS Latest Ref Range: 1.50 - 6.40 x10^3/uL 2.60   LYMPHS ABS Latest Ref Range: 0.90 - 3.40 x10^3/uL 0.50 (L)   EOS ABS Latest Ref Range: 0.00 - 0.50 x10^3/uL 0.00   MONOS ABS Latest Ref Range: 0.20 - 0.90 x10^3/uL 0.70   BASOS ABS Latest Ref Range: 0.00 - 0.20 x10^3/uL 0.00         ASSESSMENT AND PLAN:  68 y.o. female with history of stage III colon cancer status post 5 cycle of FOLFOX now presenting with stage III lung cancer.  I explained the reason for referred to Hematology/Oncology Clinic.    ICD-10-CM    1. Malignant neoplasm of lung, unspecified laterality, unspecified part of lung (CMS HCC) C34.90    2. Encounter for antineoplastic chemotherapy Z51.11    3.  Chemotherapy induced nausea and vomiting R11.2     T45.1X5A        1. Adenocarcinoma lung:  Stage III.    CT brain to rule out metastatic disease.  NEGATIVE   Port in place.   Started on chemoradiation carboplatin/Taxol along with radiation- July 23, 2018.  After completion of chemo radiation patient will get PET scan.  If continued to have good response will start maintenance immunotherapy for 1 year.    CHEMOTHERAPY:/RADIATION  CYCLE DAY   DRUG DOSE TOTAL DOSE % ROUTE SCHEDULE   Carboplatin AUC- 2 205 mg    Weekly -- HOLD   Paclitaxel 45 mg/m2 108 mg    Weekly -- HOLD                     Shared decision.  I discussed diagnosis, prognosis and treatment option with patient and family.  I had detailed discussion with patient regarding chemotherapy and radiation.  I also discussed brain scan to rule out metastatic disease before starting treatment.  I counseled patient regarding chemotherapy side effects and their management.  Patient understand and want to proceed with treatment.      2. Neutropenia:  Due to chemotherapy.   Resolved.  [place] today 2600.     3. CINV    Zofran PRN    4. Transaminitis:   resolved.    5. Thrombocytopenia:  Resolved   Resolved.   platelets today 162   will continue to  monitor.          Patient admitted to in-patient family medicine service with attending Dr. Loma Sousa     Return in about 1 week (around 09/08/2018) for cbc/diff, CMP, Treatment.      Vanetta Mulders, MD  Hematology/Oncology

## 2018-09-02 ENCOUNTER — Inpatient Hospital Stay (HOSPITAL_COMMUNITY)
Admission: RE | Admit: 2018-09-02 | Discharge: 2018-09-02 | Disposition: A | Discharge status: Still In Hospital | Payer: Medicare Other | Source: Ambulatory Visit

## 2018-09-02 ENCOUNTER — Ambulatory Visit (HOSPITAL_COMMUNITY): Payer: Medicare Other

## 2018-09-02 ENCOUNTER — Inpatient Hospital Stay (HOSPITAL_COMMUNITY)
Admission: RE | Admit: 2018-09-02 | Discharge: 2018-09-02 | Disposition: A | Discharge status: Still In Hospital | Payer: Medicare Other | Source: Ambulatory Visit | Admitting: Radiation Oncology

## 2018-09-02 NOTE — Care Management Notes (Signed)
Referral Information  ++++++ Placed Provider #1 ++++++  Case Manager: Elijah Birk  Provider Type: DME  Provider Name: [facility]  Address:  [address]  [place], [place] [address]  Contact:  Admissions Administrator  Phone: [phone] x  Fax:   Fax: [phone]

## 2018-09-02 NOTE — Nurses Notes (Signed)
Pt seen in infustion center yesterday by MAS for OTV.

## 2018-09-03 ENCOUNTER — Ambulatory Visit (HOSPITAL_COMMUNITY): Payer: Medicare Other

## 2018-09-03 ENCOUNTER — Inpatient Hospital Stay (HOSPITAL_COMMUNITY)
Admission: RE | Admit: 2018-09-03 | Discharge: 2018-09-03 | Disposition: A | Discharge status: Still In Hospital | Payer: Medicare Other | Source: Ambulatory Visit

## 2018-09-04 ENCOUNTER — Ambulatory Visit (HOSPITAL_COMMUNITY): Payer: Medicare Other

## 2018-09-04 ENCOUNTER — Inpatient Hospital Stay (HOSPITAL_COMMUNITY)
Admission: RE | Admit: 2018-09-04 | Discharge: 2018-09-04 | Disposition: A | Discharge status: Still In Hospital | Payer: Medicare Other | Source: Ambulatory Visit

## 2018-09-05 ENCOUNTER — Ambulatory Visit (HOSPITAL_COMMUNITY): Payer: Medicare Other

## 2018-09-05 ENCOUNTER — Inpatient Hospital Stay (HOSPITAL_COMMUNITY)
Admission: RE | Admit: 2018-09-05 | Discharge: 2018-09-05 | Disposition: A | Discharge status: Still In Hospital | Payer: Medicare Other | Source: Ambulatory Visit

## 2018-09-08 ENCOUNTER — Ambulatory Visit (HOSPITAL_COMMUNITY): Payer: Medicare Other

## 2018-09-08 ENCOUNTER — Inpatient Hospital Stay (HOSPITAL_COMMUNITY): Payer: Medicare Other

## 2018-09-08 ENCOUNTER — Encounter (HOSPITAL_COMMUNITY): Payer: Self-pay | Admitting: Internal Medicine

## 2018-09-09 ENCOUNTER — Ambulatory Visit (HOSPITAL_COMMUNITY): Payer: Medicare Other

## 2018-09-09 ENCOUNTER — Inpatient Hospital Stay (HOSPITAL_COMMUNITY)
Admission: RE | Admit: 2018-09-09 | Discharge: 2018-09-09 | Disposition: A | Discharge status: Still In Hospital | Payer: Medicare Other | Source: Ambulatory Visit | Admitting: Radiation Oncology

## 2018-09-09 ENCOUNTER — Inpatient Hospital Stay (HOSPITAL_COMMUNITY)
Admission: RE | Admit: 2018-09-09 | Discharge: 2018-09-09 | Disposition: A | Discharge status: Still In Hospital | Payer: Medicare Other | Source: Ambulatory Visit

## 2018-09-09 NOTE — Nurses Notes (Addendum)
Pt in room 2 for Lung OTV. Pt denies any complaints or changes to medication/allergies. Jimmye Norman, RN    Pt missed yesterdays apt due to weather, as verbalized by pt. Pt to resume tx per MAS. Jimmye Norman, RN

## 2018-09-09 NOTE — Progress Notes (Signed)
4320 rad  Looks better   No issues  kps decent .ambulates at home  cpm

## 2018-09-10 ENCOUNTER — Inpatient Hospital Stay (HOSPITAL_COMMUNITY)
Admission: RE | Admit: 2018-09-10 | Discharge: 2018-09-10 | Disposition: A | Discharge status: Still In Hospital | Payer: Medicare Other | Source: Ambulatory Visit

## 2018-09-10 ENCOUNTER — Inpatient Hospital Stay
Admission: RE | Admit: 2018-09-10 | Discharge: 2018-09-10 | Disposition: A | Discharge status: Still In Hospital | Payer: Medicare Other | Source: Ambulatory Visit | Attending: Nurse Practitioner | Admitting: Nurse Practitioner

## 2018-09-10 ENCOUNTER — Ambulatory Visit (HOSPITAL_BASED_OUTPATIENT_CLINIC_OR_DEPARTMENT_OTHER): Payer: Medicare Other | Admitting: Nurse Practitioner

## 2018-09-10 ENCOUNTER — Ambulatory Visit (HOSPITAL_COMMUNITY): Payer: Medicare Other

## 2018-09-10 ENCOUNTER — Ambulatory Visit (HOSPITAL_COMMUNITY)
Admission: RE | Admit: 2018-09-10 | Discharge: 2018-09-10 | Disposition: A | Discharge status: Home / Self Care | Payer: Medicare Other | Source: Ambulatory Visit

## 2018-09-10 ENCOUNTER — Other Ambulatory Visit (HOSPITAL_COMMUNITY): Payer: Self-pay | Admitting: Nurse Practitioner

## 2018-09-10 ENCOUNTER — Encounter (HOSPITAL_COMMUNITY): Payer: Self-pay | Admitting: Nurse Practitioner

## 2018-09-10 ENCOUNTER — Encounter (HOSPITAL_COMMUNITY): Payer: Self-pay

## 2018-09-10 ENCOUNTER — Other Ambulatory Visit (HOSPITAL_COMMUNITY): Payer: Self-pay | Admitting: Radiology

## 2018-09-10 ENCOUNTER — Telehealth (HOSPITAL_COMMUNITY): Payer: Self-pay | Admitting: Hematology & Oncology

## 2018-09-10 ENCOUNTER — Other Ambulatory Visit (HOSPITAL_COMMUNITY): Payer: Self-pay | Admitting: Internal Medicine

## 2018-09-10 VITALS — BP 136/72 | HR 72 | Temp 96.8°F | Resp 16 | Ht 62.0 in | Wt 282.0 lb

## 2018-09-10 DIAGNOSIS — Z79899 Other long term (current) drug therapy: Secondary | ICD-10-CM | POA: Insufficient documentation

## 2018-09-10 DIAGNOSIS — E785 Hyperlipidemia, unspecified: Secondary | ICD-10-CM | POA: Insufficient documentation

## 2018-09-10 DIAGNOSIS — I10 Essential (primary) hypertension: Secondary | ICD-10-CM | POA: Insufficient documentation

## 2018-09-10 DIAGNOSIS — J449 Chronic obstructive pulmonary disease, unspecified: Secondary | ICD-10-CM | POA: Insufficient documentation

## 2018-09-10 DIAGNOSIS — R112 Nausea with vomiting, unspecified: Secondary | ICD-10-CM | POA: Insufficient documentation

## 2018-09-10 DIAGNOSIS — Z5111 Encounter for antineoplastic chemotherapy: Secondary | ICD-10-CM

## 2018-09-10 DIAGNOSIS — C3490 Malignant neoplasm of unspecified part of unspecified bronchus or lung: Secondary | ICD-10-CM

## 2018-09-10 DIAGNOSIS — G4730 Sleep apnea, unspecified: Secondary | ICD-10-CM | POA: Insufficient documentation

## 2018-09-10 DIAGNOSIS — T451X5A Adverse effect of antineoplastic and immunosuppressive drugs, initial encounter: Secondary | ICD-10-CM

## 2018-09-10 DIAGNOSIS — D709 Neutropenia, unspecified: Secondary | ICD-10-CM

## 2018-09-10 DIAGNOSIS — Z7901 Long term (current) use of anticoagulants: Secondary | ICD-10-CM | POA: Insufficient documentation

## 2018-09-10 DIAGNOSIS — D6959 Other secondary thrombocytopenia: Secondary | ICD-10-CM | POA: Insufficient documentation

## 2018-09-10 DIAGNOSIS — Z85038 Personal history of other malignant neoplasm of large intestine: Secondary | ICD-10-CM | POA: Insufficient documentation

## 2018-09-10 DIAGNOSIS — C3431 Malignant neoplasm of lower lobe, right bronchus or lung: Secondary | ICD-10-CM | POA: Insufficient documentation

## 2018-09-10 DIAGNOSIS — D701 Agranulocytosis secondary to cancer chemotherapy: Secondary | ICD-10-CM | POA: Insufficient documentation

## 2018-09-10 DIAGNOSIS — R7401 Elevation of levels of liver transaminase levels: Secondary | ICD-10-CM

## 2018-09-10 DIAGNOSIS — R52 Pain, unspecified: Secondary | ICD-10-CM

## 2018-09-10 DIAGNOSIS — Z87891 Personal history of nicotine dependence: Secondary | ICD-10-CM | POA: Insufficient documentation

## 2018-09-10 DIAGNOSIS — Z9884 Bariatric surgery status: Secondary | ICD-10-CM | POA: Insufficient documentation

## 2018-09-10 DIAGNOSIS — E669 Obesity, unspecified: Secondary | ICD-10-CM | POA: Insufficient documentation

## 2018-09-10 DIAGNOSIS — R740 Nonspecific elevation of levels of transaminase and lactic acid dehydrogenase [LDH]: Secondary | ICD-10-CM

## 2018-09-10 DIAGNOSIS — I4891 Unspecified atrial fibrillation: Secondary | ICD-10-CM | POA: Insufficient documentation

## 2018-09-10 LAB — CBC WITH DIFF
BASOPHIL #: 0 x10ˆ3/uL (ref 0.00–0.20)
BASOPHIL %: 1 %
EOSINOPHIL #: 0 x10ˆ3/uL (ref 0.00–0.50)
EOSINOPHIL %: 1 %
HCT: 32.7 % — ABNORMAL LOW (ref 34.6–46.2)
HGB: 10.7 g/dL — ABNORMAL LOW (ref 11.8–15.8)
LYMPHOCYTE #: 0.5 10*3/uL — ABNORMAL LOW (ref 0.90–3.40)
LYMPHOCYTE %: 15 %
MCH: 27 pg — ABNORMAL LOW (ref 27.6–33.2)
MCHC: 32.8 g/dL (ref 32.6–35.4)
MCV: 82.1 fL — ABNORMAL LOW (ref 82.3–96.7)
MONOCYTE #: 0.4 x10ˆ3/uL (ref 0.20–0.90)
MONOCYTE %: 13 %
MPV: 7.9 fL (ref 6.6–10.2)
NEUTROPHIL #: 2.1 x10ˆ3/uL (ref 1.50–6.40)
NEUTROPHIL %: 71 %
PLATELETS: 217 x10ˆ3/uL (ref 140–440)
RBC: 3.98 x10ˆ6/uL (ref 3.80–5.24)
RDW: 19.1 % — ABNORMAL HIGH (ref 12.4–15.2)
WBC: 3 10*3/uL — ABNORMAL LOW (ref 3.5–10.3)

## 2018-09-10 LAB — COMPREHENSIVE METABOLIC PANEL, NON-FASTING
ALBUMIN: 3.9 g/dL (ref 3.2–4.6)
ALKALINE PHOSPHATASE: 84 U/L (ref 20–130)
ALT (SGPT): 41 U/L (ref <=52)
ANION GAP: 7 mmol/L
AST (SGOT): 43 U/L — ABNORMAL HIGH (ref <=35)
BILIRUBIN TOTAL: 0.5 mg/dL (ref 0.3–1.2)
BUN/CREA RATIO: 15
BUN: 12 mg/dL (ref 10–25)
CALCIUM: 8.9 mg/dL (ref 8.8–10.3)
CHLORIDE: 107 mmol/L (ref 98–111)
CO2 TOTAL: 24 mmol/L (ref 21–35)
CREATININE: 0.8 mg/dL (ref <=1.30)
ESTIMATED GFR: >60 mL/min/1.73mˆ2
GLUCOSE: 132 mg/dL — ABNORMAL HIGH (ref 70–110)
POTASSIUM: 4.1 mmol/L (ref 3.5–5.0)
PROTEIN TOTAL: 5.8 g/dL — ABNORMAL LOW (ref 6.0–8.3)
SODIUM: 138 mmol/L (ref 135–145)

## 2018-09-10 MED ORDER — SODIUM CHLORIDE 0.9 % INTRAVENOUS SOLUTION
226.4000 mg | Freq: Once | INTRAVENOUS | Status: AC
Start: 2018-09-10 — End: 2018-09-10
  Administered 2018-09-10: 0 mg via INTRAVENOUS
  Administered 2018-09-10: 225 mg via INTRAVENOUS
  Filled 2018-09-10: qty 22.5

## 2018-09-10 MED ORDER — PALONOSETRON 0.25 MG/5 ML INTRAVENOUS SOLUTION
0.2500 mg | Freq: Once | INTRAVENOUS | Status: AC
Start: 2018-09-10 — End: 2018-09-10
  Administered 2018-09-10: 0.25 mg via INTRAVENOUS
  Filled 2018-09-10: qty 5

## 2018-09-10 MED ORDER — FAMOTIDINE (PF) 20 MG/2 ML INTRAVENOUS SOLUTION
20.0000 mg | Freq: Once | INTRAVENOUS | Status: DC
Start: 2018-09-10 — End: 2018-09-10

## 2018-09-10 MED ORDER — PROCHLORPERAZINE EDISYLATE 10 MG/2 ML (5 MG/ML) INJECTION SOLUTION
10.0000 mg | Freq: Once | INTRAMUSCULAR | Status: AC
Start: 2018-09-10 — End: 2018-09-10
  Administered 2018-09-10: 10 mg via INTRAVENOUS
  Filled 2018-09-10: qty 2

## 2018-09-10 MED ORDER — SODIUM CHLORIDE 0.9 % INTRAVENOUS SOLUTION
45.0000 mg/m2 | Freq: Once | INTRAVENOUS | Status: AC
Start: 2018-09-10 — End: 2018-09-10
  Administered 2018-09-10: 0 mg via INTRAVENOUS
  Administered 2018-09-10: 108 mg via INTRAVENOUS
  Filled 2018-09-10: qty 18

## 2018-09-10 MED ORDER — LORATADINE 10 MG TABLET
10.0000 mg | ORAL_TABLET | Freq: Every day | ORAL | 0 refills | Status: DC
Start: 2018-09-10 — End: 2018-12-23

## 2018-09-10 MED ORDER — OXYCODONE 5 MG TABLET: 5 mg | Tab | ORAL | 0 refills | 0 days | Status: DC | PRN

## 2018-09-10 MED ORDER — DIPHENHYDRAMINE 50 MG/ML INJECTION SOLUTION
50.0000 mg | Freq: Once | INTRAMUSCULAR | Status: DC
Start: 2018-09-10 — End: 2018-09-10

## 2018-09-10 MED ORDER — SODIUM CHLORIDE 0.9 % INTRAVENOUS SOLUTION
Freq: Once | INTRAVENOUS | Status: AC
Start: 2018-09-10 — End: 2018-09-10
  Filled 2018-09-10: qty 2

## 2018-09-10 MED ORDER — SODIUM CHLORIDE 0.9 % INTRAVENOUS SOLUTION
20.0000 mg | Freq: Once | INTRAVENOUS | Status: AC
Start: 2018-09-10 — End: 2018-09-10
  Administered 2018-09-10: 20 mg via INTRAVENOUS
  Administered 2018-09-10: 0 mg via INTRAVENOUS
  Filled 2018-09-10: qty 5

## 2018-09-10 MED ADMIN — sodium chloride 0.9 % intravenous solution: INTRAVENOUS | @ 10:00:00

## 2018-09-10 NOTE — Nurses Notes (Addendum)
[phone] right chest port flushed with 84ml NS, -BR, pt denies no pain or swelling. Aloxi given over 30 secs. IV Push. Port flushed with 10 ml NS, -BR. Benadryl/pepcid started to infuse over 4mins. Tobias Alexander, RN  1007 benadryl/pepcid complete. Port flushed with 10 ml NS, -Br. Decadron started infuse over 61mins. Tobias Alexander, RN  [phone] Decadron complete. Port flushed with 10 ml NS, -BR. Taxol started to infuse over 1 hour. Tobias Alexander, RN   [phone] pt is having nausea. Called [facility], she stated she would put in an order for compazine. Tobias Alexander, RN  [phone]: Taxol stopped. Port flushed with 10 ml ns.  Compazine given slow iv push over 2 minutes for c/o nausea, flushed after with 10 ml ns.  Taxol restarted at previous rate. Caprice Renshaw, RN  [phone] Taxol complete. Port flushed with 10 ml NS, -BR. Carboplatin started to infuse over 75mins. Tobias Alexander, RN  [phone] Carboplatin complete. Pt denies complaints. Port flushed with 62ml NS, -BR and heparinized with 18ml of Heparin. Port deaccessed and dressed with bandaid. Pt left ambulatory. Tobias Alexander, RN

## 2018-09-10 NOTE — Progress Notes (Signed)
[place]  [place] [address]        Encounter Date: 09/10/2018   8:30 AM EST    Name:  Kelsey [name]  Age: 68 y.o.  DOB: 1950-11-29  Sex: female  PCP: [facility]    Chief Complaint:    Chief Complaint   Patient presents with   . Treatment     HISTORY OF PRESENT ILLNESS:   68 y.o. female with history of stage III colon cancer of present evaluation and management of lung cancer.    1. September 2015. Patient underwent sigmoidoscopy and was found to have polyp which was adenocarcinoma.    2. June 10, 2014. She was admitted for segmental resection of sigmoid colon. This revealed pT3, N1a, MX, low-grade  adenocarcinoma of the colon. The mass measuring 4 x 3 x 0.5 centimeters. It  was low-grade and there was no evidence of microsatellite instability by  histology. Margins were uninvolved and 4 lymph nodes only were removed which  only 1 was involved. This corresponding stage IIIB, T3, N1a, M0, low-grade  sigmoid cancer.     3. July 07, 2014. Recommended adjuvant chemotherapy with FOLFOX.    4. July 27, 2014. Patient was started on adjuvant chemotherapy with FOLFOX.  Patient received only 5 treatments and chemotherapy was stopped due to poor tolerance.  After that patient lost follow-up.      5. October 2019. Patient was admitted to hospital with concern for  bowel obstruction.  CT chest showed lung nodules.  Patient was managed conservatively and discharged home.    6. May 06, 2018.  Patient underwent CT-guided biopsy of lung lesion.  Pathology showed well-differentiated adenocarcinoma.  Section shows well-differentiated adenocarcinoma which positive for CK 7 and TTF 1, negative for P 63 and CK 20.     7. June 23, 2018. PET scan showed Hypermetabolic right lower lobe pulmonary mass compatible with malignancy.  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 Hepatomegaly and  steatosis.     8. July 23, 2018. Patient was started on chemoradiation.    SUBJECTIVE:  Patient is here for follow-up and treatment.  Patient reports she has been doing well and has not had any fevers, night sweats, chills.  Patient reports she has generalized joint pain but reports she has been taking Tylenol in the past and now knows that she can just take Tylenol for joint pain due to possible masking of fever while on chemotherapy.  Patient denies any headache, blurred vision, dizziness.  Patient denies any cough or shortness of breath.  Patient states I am feeling pretty well.       REVIEW OF SYSTEMS:  GENERAL:  No fevers or chills.  HEENT: no sore throat, congestion, blurry vision  Lungs:  No shortness of breath, cough or congestion.  GI: no nausea, vomiting, constipation, no diarrhea  GU: No dysuria, urgency, increased frequency   Cardiac: no chest pains, palpitations, syncope,   Neuro: no weakness, loss of function, numbness, tingling  Skin: no rash  Musculoskeletal: No myalgias  Psychosocial: No depression, anxiety  Hematologic: No easy bruising, abnormal bleeding  All other review ROS negative except those mentioned above.     PATIENT HISTORY:  Past Medical History:   Diagnosis Date   . A-fib (CMS HCC)    . Arthropathy, unspecified, site unspecified    . Asthma    . Cancer (CMS HCC)     cervical   . Cataract    . Colon cancer (CMS [facility] [place]) 07/06/2014   . COPD (chronic obstructive pulmonary disease) (CMS HCC)    . Fistula    . HTN (hypertension)    . Hyperlipidemia    . Obesity    . Sleep apnea    . Ventral hernia        Past Surgical History:   Procedure Laterality Date   . ABCESS DRAINAGE     . BOWEL RESECTION     . CATARACT EXTRACTION     . HX CERVICAL CONE BIOPSY      . HX CESAREAN SECTION     . HX GASTRIC BYPASS      25 years ago   . [place] (x2)   . HX HYSTERECTOMY      late 1990s   . HX LAP BANDING      2013   . HX LAPAROTOMY      2013 to remove lap band       Family Medical  History:     Problem Relation (Age of Onset)    Diabetes Mother, Maternal Grandmother, Maternal Grandfather, Father    Heart Attack Mother    Stroke Father          Current Outpatient Medications   Medication Sig   . acetaminophen (TYLENOL) 325 mg Oral Tablet Take 2 Tabs (650 mg total) by mouth Every 6 hours as needed (Patient not taking: Reported on 08/25/2018)   . albuterol sulfate (PROVENTIL) 2.5 mg /3 mL (0.083 %) Inhalation Solution for Nebulization 3 mL (2.5 mg total) by Nebulization route Every 6 hours   . amiodarone (PACERONE) 200 mg Oral Tablet Take 1 Tab (200 mg total) by mouth Twice daily   . apixaban (ELIQUIS) 5 mg Oral Tablet Take 1 Tab (5 mg total) by mouth Twice daily   . atorvastatin (LIPITOR) 10 mg Oral Tablet Take 10 mg by mouth Once a day   . budesonide-formoterol (SYMBICORT) 160-4.5 mcg/actuation Inhalation HFA Aerosol Inhaler Take 2 Puffs by inhalation Twice daily   . CARVEDILOL ORAL Take 6.25 mg by mouth Twice daily 3 tablets   . docusate sodium (COLACE) 100 mg Oral Capsule Take 100 mg by mouth Three times a day as needed    . DULoxetine (CYMBALTA DR) 30 mg Oral Capsule, Delayed Release(E.C.) Take 30 mg by mouth Once a day   . linaCLOtide (LINZESS) 72 mcg Oral Capsule Take 72 mcg by mouth Every morning   . loperamide (IMODIUM) 2 mg Oral Capsule Take 2 mg by mouth Every 4 hours as needed   . loratadine (CLARITIN) 10 mg Oral Tablet Take 1 Tab (10 mg total) by mouth Once a day   . magnesium oxide (MAG-OX) 400 mg Oral Tablet Take 400 mg by mouth Once a day   . omeprazole (PRILOSEC) 20 mg Oral Capsule, Delayed Release(E.C.) Take 40 mg by mouth Once a day    . oxyCODONE (ROXICODONE) 5 mg Oral Tablet Take 1 Tab (5 mg total) by mouth Every 4 hours as needed for Pain   . potassium chloride (K-DUR) 20 mEq Oral Tab Sust.Rel. Particle/Crystal Take 20 mEq by mouth Once a day   . prochlorperazine (COMPAZINE) 10 mg Oral  Tablet Take 1 Tab (10 mg total) by mouth Four times a day as needed for nausea/vomiting   .  rOPINIRole (REQUIP) 0.5 mg Oral Tablet Take 0.5 mg by mouth Every night     Social History     Socioeconomic History   . Marital status: Married     Spouse name: Not on file   . Number of children: Not on file   . Years of education: Not on file   . Highest education level: Not on file   Tobacco Use   . Smoking status: Former Research scientist (life sciences)   . Smokeless tobacco: Never Used   . Tobacco comment: quit 10 yrs ago   Substance and Sexual Activity   . Alcohol use: Not Currently     Comment: rarely   . Drug use: No   Other Topics Concern   . Ability to Walk 1 Flight of Steps without SOB/CP No   . Ability To Do Own ADL's Yes       PHYSICAL EXAMINATION:  VITALS:   Vitals:    09/10/18 0830   BP: 136/72   Pulse: 72   Resp: 16   Temp: 36 C (96.8 F)   TempSrc: Thermal Scan   SpO2: 97%   Weight: 127.9 kg (282 lb)   Height: 1.575 m (5' 2")   BMI: 51.69       PHYSICAL EXAM:   Consitutional: No acute distress.    Eyes: EOMI. No discharge. No Jaundice.   ENT: mucous membranes dry. No posterior pharynx lesions.. Neck supple, No palpable masses   Heme/ Lymph: No Cervical, Inguinal, axillary lymh nodes. No Bruising.   Cardiovascular: S1, S2, No murmurs, rubs, or gallops.   Respiratory: Clear to auscultate and percuss B/L, No rhonchi, crackles, or wheezing.   Abdomen: Normal Bowel Sounds, nontender nondistended. No hepatosplenomegaly.   Musculoskeletal: No Edema to the extremities.   Skin: Normal turgor. No Rashes,skin lesions   Psychiatry: Normal Affect   Neuro: No focal deficits. Alert and Oriented x 3      ENCOUNTER ORDERS:  Orders Placed This Encounter   . loratadine (CLARITIN) 10 mg Oral Tablet       LABORATORY/RADIOLOGICAL DATA: All pertinent labs/radiology were reviewed.    Ref. Range 09/10/2018 08:06   WBC Latest Ref Range: 3.5 - 10.3 x10^3/uL 3.0 (L)   HGB Latest Ref Range: 11.8 - 15.8 g/dL 10.7 (L)   HCT Latest Ref Range: 34.6 - 46.2 % 32.7 (L)   PLATELET COUNT Latest Ref Range: 140 - 440 x10^3/uL 217   RBC Latest Ref Range: 3.80 -  5.24 x10^6/uL 3.98   MCV Latest Ref Range: 82.3 - 96.7 fL 82.1 (L)   MCHC Latest Ref Range: 32.6 - 35.4 g/dL 32.8   MCH Latest Ref Range: 27.6 - 33.2 pg 27.0 (L)   RDW Latest Ref Range: 12.4 - 15.2 % 19.1 (H)   MPV Latest Ref Range: 6.6 - 10.2 fL 7.9   PMN'S Latest Units: % 71   LYMPHOCYTES Latest Units: % 15   EOSINOPHIL Latest Units: % 1   MONOCYTES Latest Units: % 13   BASOPHILS Latest Units: % 1   PMN ABS Latest Ref Range: 1.50 - 6.40 x10^3/uL 2.10   LYMPHS ABS Latest Ref Range: 0.90 - 3.40 x10^3/uL 0.50 (L)   EOS ABS Latest Ref Range: 0.00 - 0.50 x10^3/uL 0.00   MONOS ABS Latest Ref Range: 0.20 - 0.90 x10^3/uL 0.40   BASOS ABS Latest Ref Range: 0.00 - 0.20  x10^3/uL 0.00   SODIUM Latest Ref Range: 135 - 145 mmol/L 138   POTASSIUM Latest Ref Range: 3.5 - 5.0 mmol/L 4.1   CHLORIDE Latest Ref Range: 98 - 111 mmol/L 107   CARBON DIOXIDE Latest Ref Range: 21 - 35 mmol/L 24   BUN Latest Ref Range: 10 - 25 mg/dL 12   CREATININE Latest Ref Range: <=1.30 mg/dL 0.80   GLUCOSE Latest Ref Range: 70 - 110 mg/dL 132 (H)   ANION GAP Latest Units: mmol/L 7   BUN/CREAT RATIO Unknown 15   ESTIMATED GLOMERULAR FILTRATION RATE Latest Ref Range: Avg: 85 mL/min/1.12m2 >60   CALCIUM Latest Ref Range: 8.8 - 10.3 mg/dL 8.9   TOTAL PROTEIN Latest Ref Range: 6.0 - 8.3 g/dL 5.8 (L)   ALBUMIN Latest Ref Range: 3.2 - 4.6 g/dL 3.9   BILIRUBIN, TOTAL Latest Ref Range: 0.3 - 1.2 mg/dL 0.5   AST (SGOT) Latest Ref Range: <=35 U/L 43 (H)   ALT (SGPT) Latest Ref Range: <=52 U/L 41   ALKALINE PHOSPHATASE Latest Ref Range: 20 - 130 U/L 84       ASSESSMENT AND PLAN:  68y.o. female with history of stage III colon cancer status post 5 cycle of FOLFOX now presenting with stage III lung cancer.  I explained the reason for referred to Hematology/Oncology Clinic.    ICD-10-CM    1. Malignant neoplasm of lung, unspecified laterality, unspecified part of lung (CMS HCC) C34.90    2. Encounter for antineoplastic chemotherapy Z51.11    3. Chemotherapy induced  nausea and vomiting R11.2     T45.1X5A    4. Neutropenia (CMS HCC) D70.9    5. Transaminitis R74.0    6. Pain R52    7. Chemotherapy-induced thrombocytopenia D69.59     T45.1X5A        1. Adenocarcinoma lung:  Stage III.    CT brain to rule out metastatic disease.  NEGATIVE.   Started on chemoradiation carboplatin/Taxol along with radiation- July 23, 2018.  After completion of chemo radiation patient will get PET scan.  If continued to have good response will start maintenance immunotherapy for 1 year.    CHEMOTHERAPY:/RADIATION  CYCLE 6 DAY   DRUG DOSE TOTAL DOSE % ROUTE SCHEDULE   Carboplatin AUC- 2 205 mg    Weekly -- HOLD   Paclitaxel 45 mg/m2 108 mg    Weekly -- HOLD                     Shared decision.  I discussed diagnosis, prognosis and treatment option with patient and family.  I had detailed discussion with patient regarding chemotherapy and radiation.  I also discussed brain scan to rule out metastatic disease before starting treatment.  I counseled patient regarding chemotherapy side effects and their management.  Patient understand and want to proceed with treatment.      2. Neutropenia:  Due to chemotherapy.   Resolved.  [place] 26[place].     3. CINV    Zofran PRN    4. Transaminitis:   resolved.    5. Thrombocytopenia:  Resolved   Resolved.   platelets today 162   will continue to monitor.    6. Pain   Per ADe[facility]ter[facility]codone for pain management               Return in about 1 week (around 09/17/2018) for cbc/diff, CMP, Treatment.    COmer Jack APRN, FNP-C    Addendum:  NReola Calkins  issues since last visit.  Tolerated treatment well.  No fevers or chills.  Will proceed with treatment today.      I saw and examined the patient.  I reviewed the NP s note.  I discussed the findings, assessment and plan of care as documented in the NP note and agree with documentation above.  Any exceptions/additions are edited/noted.      Vanetta Mulders, MD  Hematology/Oncology

## 2018-09-11 ENCOUNTER — Ambulatory Visit (INDEPENDENT_AMBULATORY_CARE_PROVIDER_SITE_OTHER): Payer: Medicare Other | Admitting: Cardiovascular Disease

## 2018-09-11 ENCOUNTER — Ambulatory Visit (HOSPITAL_COMMUNITY): Payer: Medicare Other

## 2018-09-11 ENCOUNTER — Encounter (INDEPENDENT_AMBULATORY_CARE_PROVIDER_SITE_OTHER): Payer: Self-pay | Admitting: Cardiovascular Disease

## 2018-09-11 ENCOUNTER — Inpatient Hospital Stay (HOSPITAL_COMMUNITY)
Admission: RE | Admit: 2018-09-11 | Discharge: 2018-09-11 | Disposition: A | Discharge status: Still In Hospital | Payer: Medicare Other | Source: Ambulatory Visit

## 2018-09-11 VITALS — BP 136/86 | HR 82 | Ht 62.0 in | Wt 282.0 lb

## 2018-09-11 DIAGNOSIS — I480 Paroxysmal atrial fibrillation: Secondary | ICD-10-CM

## 2018-09-11 DIAGNOSIS — C3490 Malignant neoplasm of unspecified part of unspecified bronchus or lung: Secondary | ICD-10-CM

## 2018-09-11 DIAGNOSIS — Z6841 Body Mass Index (BMI) 40.0 and over, adult: Secondary | ICD-10-CM

## 2018-09-11 MED ORDER — AMIODARONE 200 MG TABLET
200.0000 mg | ORAL_TABLET | Freq: Every day | ORAL | 5 refills | Status: DC
Start: 2018-09-11 — End: 2018-09-29

## 2018-09-11 NOTE — Progress Notes (Signed)
[facility]  [address] [place] [address]   Phone: [phone]    NAME: Kelsey [name]  DOB:  [birth year]  AGE:  68 y.o.  MRN:  [id number]    APPT:  09/11/2018  9:15 AM EST    Chief complaint:   Chief Complaint   Patient presents with   . Follow Up 6 Months       Subjective:     This is a case of a 68 y.o. year old female who comes in today for atrial fibrillation.      HPI:  Patient came to follow her atrial breath patient thinks she is doing much better now she is walking more and more lately breath patient does have face now she does do to protect from getting infection breath patient also knee problem breath patient see Dr.Ali her cancer doctor next week breath denies any chest pain, PND, orthopnea, leg swelling    Current Outpatient Medications   Medication Sig   . acetaminophen (TYLENOL) 325 mg Oral Tablet Take 2 Tabs (650 mg total) by mouth Every 6 hours as needed (Patient not taking: Reported on 08/25/2018)   . albuterol sulfate (PROVENTIL) 2.5 mg /3 mL (0.083 %) Inhalation Solution for Nebulization 3 mL (2.5 mg total) by Nebulization route Every 6 hours   . amiodarone (PACERONE) 200 mg Oral Tablet Take 1 Tab (200 mg total) by mouth Once a day   . apixaban (ELIQUIS) 5 mg Oral Tablet Take 1 Tab (5 mg total) by mouth Twice daily   . atorvastatin (LIPITOR) 10 mg Oral Tablet Take 10 mg by mouth Once a day   . budesonide-formoterol (SYMBICORT) 160-4.5 mcg/actuation Inhalation HFA Aerosol Inhaler Take 2 Puffs by inhalation Twice daily   . CARVEDILOL ORAL Take 6.25 mg by mouth Twice daily 3 tablets   . docusate sodium (COLACE) 100 mg Oral Capsule Take 100 mg by mouth Three times a day as needed    . DULoxetine (CYMBALTA DR) 30 mg Oral Capsule, Delayed Release(E.C.) Take 30 mg by mouth Once a day   . linaCLOtide (LINZESS) 72 mcg Oral Capsule Take 72 mcg by mouth Every morning   . loperamide (IMODIUM) 2 mg Oral Capsule Take 2 mg by mouth Every 4 hours as needed   . loratadine  (CLARITIN) 10 mg Oral Tablet Take 1 Tab (10 mg total) by mouth Once a day   . magnesium oxide (MAG-OX) 400 mg Oral Tablet Take 400 mg by mouth Once a day   . omeprazole (PRILOSEC) 20 mg Oral Capsule, Delayed Release(E.C.) Take 40 mg by mouth Once a day    . oxyCODONE (ROXICODONE) 5 mg Oral Tablet Take 1 Tab (5 mg total) by mouth Every 4 hours as needed for Pain   . potassium chloride (K-DUR) 20 mEq Oral Tab Sust.Rel. Particle/Crystal Take 20 mEq by mouth Once a day   . prochlorperazine (COMPAZINE) 10 mg Oral Tablet Take 1 Tab (10 mg total) by mouth Four times a day as needed for nausea/vomiting   . rOPINIRole (REQUIP) 0.5 mg Oral Tablet Take 0.5 mg by mouth Every night       REVIEW OF SYSTEMS:    Heart: []  chest pain. []  palpitation. []  orthopnea.  []  shortness of breath []  syncope    MS. [x]  joint pain []  myalgia. []  back pain.   Neurologic: []  headaches. []  dizziness []  weakness. []  memory problems.  Vasc: []  claudication []  LE edema.   General:  []   fevers []  weight loss.[]  weight gain. []  fatigue.  Heme: []  easy bruising []  bleeding.  HEENT. []  vision changes []  hearing changes. []  dysphagia.  Lungs: []  hemoptysis. []  cough.   Abdomen: []  poor appetite. []  abdominal pain. []  nausea []  vomiting. []  diarrhea. []  constipation.   GU: []  dysuria _  []  Hematuria.Dermatologic: []  rashes. []  pruritus.   Psychiatric: []  Depression. []  anxiety. []  insomnia.       []  = negative  [x]  = positive    Family Medical History:     Problem Relation (Age of Onset)    Diabetes Mother, Maternal Grandmother, Maternal Grandfather, Father    Heart Attack Mother    Stroke Father              Past Medical History:   Diagnosis Date   . A-fib (CMS HCC)    . Arthropathy, unspecified, site unspecified    . Asthma    . Cancer (CMS HCC)     cervical   . Cataract    . Colon cancer (CMS [place]) 07/06/2014   . COPD (chronic obstructive pulmonary disease) (CMS HCC)    . Fistula    . HTN (hypertension)    . Hyperlipidemia    . Obesity    . Sleep apnea      . Ventral hernia        Past Surgical History:   Procedure Laterality Date   . ABCESS DRAINAGE     . BOWEL RESECTION     . CATARACT EXTRACTION     . HX CERVICAL CONE BIOPSY      . HX CESAREAN SECTION     . HX GASTRIC BYPASS      25 years ago   . [place] (x2)   . HX HYSTERECTOMY      late 1990s   . HX LAP BANDING      2013   . HX LAPAROTOMY      2013 to remove lap band       Social History     Tobacco Use   Smoking Status Former Smoker   Smokeless Tobacco Never Used   Tobacco Comment    quit 10 yrs ago     Social History     Substance and Sexual Activity   Alcohol Use Not Currently    Comment: rarely     Social History     Substance and Sexual Activity   Drug Use No     Allergies   Allergen Reactions   . Shellfish Containing Products Shortness of Breath, Rash and Itching     scallops   . Vancomycin Shortness of Breath   . Barium Iodide    . Iodine And Iodide Containing Products      Itching, shortness of breath        Objective:   Physical Exam:   BP 136/86 (Site: Right, Patient Position: Sitting, Cuff Size: Adult)   Pulse 82   Ht 1.575 m (5\' 2" )   Wt 127.9 kg (282 lb)   BMI 51.58 kg/m       Cardio:  Regular rhythm. S1,S2, and S4 present.Marland Kitchen PMI located in the midclavicular line. No gallops or rubs . systolic murmur at apex.    Vascular:  Both radial and posterior tibial pulses equal. No bruit.     Resp: Clear to auscultation bilaterally. No wheezing and rails    Extremities: No edema. No gait disturbances or weakness.  Muscle strength normal in bilateral upper and lower extremities.    General: No acute distress, alert and oriented x3  HEENT: Conjunctivae are without injection or scleral icterus. Lids normal.  Neck: Neck supple without lymphadenopathy. Thyroid non-tender and without nodules.  Abd: Bowel sounds present in all 4 quadrants. Negative murphy's sign. No rebound tenderness or involuntary guarding. Liver and spleen not palpable.  Skin: No rashes, ulcers, or bruising.  Neuro:  No focal deficits. CN 2-12 intact.  Psych: Normal mood and affect. Recent and remote memory appears to be intact as the patient can provide history for the H&P.      Labs:  No results found for: CHOLESTEROL, HDLCHOL, LDLCHOL, LDLCHOLDIR, TRIG  CBC Results   Recent Labs     09/10/18  0806   WBC 3.0*   HGB 10.7*   HCT 32.7*   PLTCNT 217      BMP Results   Recent Labs     09/10/18  0806   SODIUM 138   POTASSIUM 4.1   CHLORIDE 107   CO2 24   BUN 12   CREATININE 0.80   GFR >60   ANIONGAP 7     Recent Labs     09/10/18  0806   CALCIUM 8.9   ALBUMIN 3.9      Coag Results   No results for input(s): INR, PROTHROMTME, APTT in the last 72 hours.    Invalid input(s): PTT, PT, CREACTPROTIE   Cardiac Results      No results for input(s): UHCEASTTROPI, CKMB, MBINDEX, BNP in the last 72 hours.       There are no exam notes on file for this visit.      Assessment/Plan     ENCOUNTER DIAGNOSES     ICD-10-CM   1. Paroxysmal atrial fibrillation (CMS HCC) I48.0   2. Malignant neoplasm of lung, unspecified laterality, unspecified part of lung (CMS HCC) C34.90   3. Morbid obesity with BMI of 50.0-59.9, adult (CMS HCC) E66.01    Z68.43       Atrial fibrillation, paroxysmal patient normal rhythm patient amiodarone told p.o. b.i.d. spoke all the side effects EKG showed normal rhythm nonspecific ST T wave changes  Plan decrease amiodarone to 200 daily she does understand small risk that she may go back into atrial ablation patient worried about side effects I told the patient to get TSH, LFTs every 6 months, chest x-ray eye examination every year and PFTs on required basis as long she is on amiodarone.  Will decrease amiodarone to 200 daily.  Will repeat labs as follows in 3 months      Orders Placed This Encounter   . THYROID STIMULATING HORMONE (SENSITIVE TSH)   . COMPREHENSIVE METABOLIC PANEL, NON-FASTING   . ECG (Completed in CLINIC)   . amiodarone (PACERONE) 200 mg Oral Tablet         The patient was given ample opportunity to ask  questions and those questions were answered to the patient's satisfaction. The patient was encouraged to be involved in their own care, and all diagnoses, medications, and medication side-effects were discussed.  A copy of the patient's medication list was printed and given to the patient. A good faith effort was made to reconcile the patient's medications.  The patient was told to contact me with any additional questions or concerns, or go to the ED in an emergency.       Riley Churches, MD on 09/11/18 at 09:21.

## 2018-09-12 ENCOUNTER — Inpatient Hospital Stay (HOSPITAL_COMMUNITY)
Admission: RE | Admit: 2018-09-12 | Discharge: 2018-09-12 | Disposition: A | Discharge status: Still In Hospital | Payer: Medicare Other | Source: Ambulatory Visit

## 2018-09-12 ENCOUNTER — Ambulatory Visit (HOSPITAL_COMMUNITY): Payer: Medicare Other

## 2018-09-15 ENCOUNTER — Ambulatory Visit (HOSPITAL_COMMUNITY): Payer: Medicare Other

## 2018-09-16 ENCOUNTER — Inpatient Hospital Stay (HOSPITAL_COMMUNITY)
Admission: RE | Admit: 2018-09-16 | Discharge: 2018-09-16 | Disposition: A | Discharge status: Still In Hospital | Payer: Medicare Other | Source: Ambulatory Visit | Admitting: Radiation Oncology

## 2018-09-16 ENCOUNTER — Ambulatory Visit (HOSPITAL_COMMUNITY): Payer: Medicare Other

## 2018-09-16 ENCOUNTER — Inpatient Hospital Stay (HOSPITAL_COMMUNITY)
Admission: RE | Admit: 2018-09-16 | Discharge: 2018-09-16 | Disposition: A | Discharge status: Still In Hospital | Payer: Medicare Other | Source: Ambulatory Visit

## 2018-09-16 ENCOUNTER — Telehealth (HOSPITAL_COMMUNITY): Payer: Self-pay

## 2018-09-16 NOTE — Nurses Notes (Signed)
Pt to see Newman Nip RN for 02 SAT evaluation.  Megan and Dr. Deatra Canter in [place], [place] at this time. Telephone encounter with Jinny Blossom.  Writer agreed to do Home 02 testing on pt. Pt's 02 SATS during rest without supplemental 02 was 97%.  Pt and pt's husband accompanied Probation officer during walking 02 evaluation. Wheelchair close by if needed.  Pt walked 77 feet without supplemental 02, with a final reading 02 SAT  of 100%.  Pt c/o feeling week and could not walk any further. Pt escorted via wheelchair.  02 SAT Home Evaluation form on Megan's desk. Jimmye Norman, RN

## 2018-09-16 NOTE — Nurses Notes (Signed)
Wt 280 lbs.  Pt in room 2 for lung OTV. Pt denies any complaints or changes to medication/allergies. Jimmye Norman, RN.

## 2018-09-16 NOTE — Telephone Encounter (Signed)
Terri from radiation called stating pt is having difficulty breathing and catching her breath. She states she can get the form and qualify her today. I will f/u with her and get it arranged for her home o2 should she qualify.

## 2018-09-16 NOTE — Progress Notes (Signed)
5040 rad  See RN note  Cont xrt unless condition worsens  cpm

## 2018-09-17 ENCOUNTER — Encounter (HOSPITAL_COMMUNITY): Payer: Self-pay | Admitting: Nurse Practitioner

## 2018-09-17 ENCOUNTER — Inpatient Hospital Stay (HOSPITAL_COMMUNITY): Admission: RE | Admit: 2018-09-17 | Payer: Medicare Other | Source: Ambulatory Visit

## 2018-09-17 ENCOUNTER — Ambulatory Visit (HOSPITAL_BASED_OUTPATIENT_CLINIC_OR_DEPARTMENT_OTHER): Payer: Medicare Other | Admitting: Nurse Practitioner

## 2018-09-17 ENCOUNTER — Inpatient Hospital Stay (HOSPITAL_COMMUNITY)
Admission: RE | Admit: 2018-09-17 | Discharge: 2018-09-17 | Disposition: A | Discharge status: Still In Hospital | Payer: Medicare Other | Source: Ambulatory Visit

## 2018-09-17 ENCOUNTER — Ambulatory Visit (HOSPITAL_COMMUNITY)
Admission: RE | Admit: 2018-09-17 | Discharge: 2018-09-17 | Disposition: A | Discharge status: Home / Self Care | Payer: Medicare Other | Source: Ambulatory Visit

## 2018-09-17 VITALS — BP 122/64 | HR 89 | Temp 95.8°F | Resp 16 | Ht 62.0 in | Wt 278.0 lb

## 2018-09-17 DIAGNOSIS — J449 Chronic obstructive pulmonary disease, unspecified: Secondary | ICD-10-CM | POA: Insufficient documentation

## 2018-09-17 DIAGNOSIS — I4891 Unspecified atrial fibrillation: Secondary | ICD-10-CM | POA: Insufficient documentation

## 2018-09-17 DIAGNOSIS — Z923 Personal history of irradiation: Secondary | ICD-10-CM | POA: Insufficient documentation

## 2018-09-17 DIAGNOSIS — R740 Nonspecific elevation of levels of transaminase and lactic acid dehydrogenase [LDH]: Secondary | ICD-10-CM

## 2018-09-17 DIAGNOSIS — D701 Agranulocytosis secondary to cancer chemotherapy: Secondary | ICD-10-CM | POA: Insufficient documentation

## 2018-09-17 DIAGNOSIS — Z9221 Personal history of antineoplastic chemotherapy: Secondary | ICD-10-CM | POA: Insufficient documentation

## 2018-09-17 DIAGNOSIS — E785 Hyperlipidemia, unspecified: Secondary | ICD-10-CM | POA: Insufficient documentation

## 2018-09-17 DIAGNOSIS — Z79899 Other long term (current) drug therapy: Secondary | ICD-10-CM | POA: Insufficient documentation

## 2018-09-17 DIAGNOSIS — R112 Nausea with vomiting, unspecified: Secondary | ICD-10-CM | POA: Insufficient documentation

## 2018-09-17 DIAGNOSIS — G4730 Sleep apnea, unspecified: Secondary | ICD-10-CM | POA: Insufficient documentation

## 2018-09-17 DIAGNOSIS — I10 Essential (primary) hypertension: Secondary | ICD-10-CM | POA: Insufficient documentation

## 2018-09-17 DIAGNOSIS — Z85038 Personal history of other malignant neoplasm of large intestine: Secondary | ICD-10-CM | POA: Insufficient documentation

## 2018-09-17 DIAGNOSIS — Z9884 Bariatric surgery status: Secondary | ICD-10-CM | POA: Insufficient documentation

## 2018-09-17 DIAGNOSIS — Z87891 Personal history of nicotine dependence: Secondary | ICD-10-CM | POA: Insufficient documentation

## 2018-09-17 DIAGNOSIS — T451X5A Adverse effect of antineoplastic and immunosuppressive drugs, initial encounter: Secondary | ICD-10-CM

## 2018-09-17 DIAGNOSIS — C3490 Malignant neoplasm of unspecified part of unspecified bronchus or lung: Secondary | ICD-10-CM

## 2018-09-17 DIAGNOSIS — D709 Neutropenia, unspecified: Secondary | ICD-10-CM

## 2018-09-17 DIAGNOSIS — Z7901 Long term (current) use of anticoagulants: Secondary | ICD-10-CM | POA: Insufficient documentation

## 2018-09-17 DIAGNOSIS — M549 Dorsalgia, unspecified: Secondary | ICD-10-CM

## 2018-09-17 DIAGNOSIS — D696 Thrombocytopenia, unspecified: Secondary | ICD-10-CM | POA: Insufficient documentation

## 2018-09-17 DIAGNOSIS — Z5111 Encounter for antineoplastic chemotherapy: Secondary | ICD-10-CM

## 2018-09-17 DIAGNOSIS — E669 Obesity, unspecified: Secondary | ICD-10-CM | POA: Insufficient documentation

## 2018-09-17 DIAGNOSIS — R7401 Elevation of levels of liver transaminase levels: Secondary | ICD-10-CM

## 2018-09-17 LAB — COMPREHENSIVE METABOLIC PANEL, NON-FASTING
ALBUMIN: 3.7 g/dL (ref 3.2–4.6)
ALKALINE PHOSPHATASE: 82 U/L (ref 20–130)
ALT (SGPT): 53 U/L — ABNORMAL HIGH (ref <=52)
ANION GAP: 7 mmol/L
ANION GAP: 7 mmol/L
AST (SGOT): 55 U/L — ABNORMAL HIGH (ref <=35)
BILIRUBIN TOTAL: 0.7 mg/dL (ref 0.3–1.2)
BUN/CREA RATIO: 19
BUN: 17 mg/dL (ref 10–25)
CALCIUM: 8.9 mg/dL (ref 8.8–10.3)
CHLORIDE: 105 mmol/L (ref 98–111)
CO2 TOTAL: 24 mmol/L (ref 21–35)
CREATININE: 0.89 mg/dL (ref <=1.30)
CREATININE: 0.89 mg/dL (ref <=1.30)
ESTIMATED GFR: >60 mL/min/1.73mˆ2
GLUCOSE: 160 mg/dL — ABNORMAL HIGH (ref 70–110)
POTASSIUM: 4.3 mmol/L (ref 3.5–5.0)
PROTEIN TOTAL: 6.2 g/dL (ref 6.0–8.3)
SODIUM: 136 mmol/L (ref 135–145)

## 2018-09-17 LAB — MANUAL DIFFERENTIAL
BASOPHIL %: 1 % (ref 0–2)
BASOPHIL ABSOLUTE: 0.01 10*3/uL
BASOPHIL ABSOLUTE: 0.01 10*3/uL
EOSINOPHIL %: 1 % (ref 0–7)
EOSINOPHIL ABSOLUTE: 0.01 10*3/uL
LYMPHOCYTE %: 28 % (ref 20–40)
LYMPHOCYTE ABSOLUTE: 0.36 10*3/uL
MONOCYTE %: 11 % (ref 2–11)
MONOCYTE ABSOLUTE: 0.14 10*3/uL
MONOCYTE ABSOLUTE: 0.14 x10?3/uL
NEUTROPHIL %: 59 % (ref 50–70)
NEUTROPHIL ABSOLUTE: 0.77 10*3/uL
PLATELET ESTIMATE: ADEQUATE
WBC MORPHOLOGY COMMENT: NORMAL
WBC: 1.3 x10ˆ3/uL

## 2018-09-17 LAB — CBC WITH DIFF
BASOPHIL #: 0 x10ˆ3/uL (ref 0.00–0.20)
BASOPHIL %: 1 %
EOSINOPHIL #: 0 x10ˆ3/uL (ref 0.00–0.50)
EOSINOPHIL %: 1 %
HCT: 32.8 % — ABNORMAL LOW (ref 34.6–46.2)
HGB: 10.7 g/dL — ABNORMAL LOW (ref 11.8–15.8)
LYMPHOCYTE #: 0.3 x10ˆ3/uL — ABNORMAL LOW (ref 0.90–3.40)
LYMPHOCYTE %: 26 %
MCH: 27.1 pg — ABNORMAL LOW (ref 27.6–33.2)
MCHC: 32.5 g/dL — ABNORMAL LOW (ref 32.6–35.4)
MCV: 83.4 fL (ref 82.3–96.7)
MONOCYTE #: 0.2 x10ˆ3/uL (ref 0.20–0.90)
MONOCYTE %: 12 %
MPV: 8 fL (ref 6.6–10.2)
NEUTROPHIL #: 0.8 x10ˆ3/uL — ABNORMAL LOW (ref 1.50–6.40)
NEUTROPHIL %: 59 %
PLATELETS: 207 10*3/uL (ref 140–440)
RBC: 3.94 x10ˆ6/uL (ref 3.80–5.24)
RDW: 19.6 % — ABNORMAL HIGH (ref 12.4–15.2)
WBC: 1.3 x10ˆ3/uL — ABNORMAL LOW (ref 3.5–10.3)

## 2018-09-17 NOTE — Progress Notes (Signed)
[place]  [place] [address]        Encounter Date: 09/17/2018   9:15 AM EST    Name:  Kelsey [name]  Age: 68 y.o.  DOB: 1951/04/01  Sex: female  PCP: [facility]    Chief Complaint:    Chief Complaint   Patient presents with   . Treatment     HISTORY OF PRESENT ILLNESS:   68 y.o. female with history of stage III colon cancer of present evaluation and management of lung cancer.    1. September 2015. Patient underwent sigmoidoscopy and was found to have polyp which was adenocarcinoma.    2. June 10, 2014. She was admitted for segmental resection of sigmoid colon. This revealed pT3, N1a, MX, low-grade  adenocarcinoma of the colon. The mass measuring 4 x 3 x 0.5 centimeters. It  was low-grade and there was no evidence of microsatellite instability by  histology. Margins were uninvolved and 4 lymph nodes only were removed which  only 1 was involved. This corresponding stage IIIB, T3, N1a, M0, low-grade  sigmoid cancer.     3. July 07, 2014. Recommended adjuvant chemotherapy with FOLFOX.    4. July 27, 2014. Patient was started on adjuvant chemotherapy with FOLFOX.  Patient received only 5 treatments and chemotherapy was stopped due to poor tolerance.  After that patient lost follow-up.      5. October 2019. Patient was admitted to hospital with concern for  bowel obstruction.  CT chest showed lung nodules.  Patient was managed conservatively and discharged home.    6. May 06, 2018.  Patient underwent CT-guided biopsy of lung lesion.  Pathology showed well-differentiated adenocarcinoma.  Section shows well-differentiated adenocarcinoma which positive for CK 7 and TTF 1, negative for P 63 and CK 20.     7. June 23, 2018. PET scan showed Hypermetabolic right lower lobe pulmonary mass compatible with malignancy.  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 Hepatomegaly and  steatosis.     8. July 23, 2018. Patient was started on chemoradiation.    SUBJECTIVE:  Patient is here for follow-up.  Patient denies any fever, chills, night sweats.  The patient reports joint pain has improved following pain medication.  Patient states she has not taken any Tylenol and understand the reason.  Patient denies any headache, blurred vision, dizziness.  Patient denies any shortness of breath or chest pain.  Patient states she knows her counts must be low as she feels a little "crummy".      REVIEW OF SYSTEMS:  GENERAL:  No fevers or chills.  HEENT: no sore throat, congestion, blurry vision  Lungs:  No shortness of breath, cough or congestion.  GI: no nausea, vomiting, constipation, no diarrhea  GU: No dysuria, urgency, increased frequency   Cardiac: no chest pains, palpitations, syncope,   Neuro: no weakness, loss of function, numbness, tingling  Skin: no rash  Musculoskeletal: No myalgias  Psychosocial: No depression, anxiety  Hematologic: No easy bruising, abnormal bleeding  All other review ROS negative except those mentioned above.     PATIENT HISTORY:  Past Medical History:   Diagnosis Date   . A-fib (CMS HCC)    . Arthropathy,  unspecified, site unspecified    . Asthma    . Cancer (CMS HCC)     cervical   . Cataract    . Colon cancer (CMS [place]) 07/06/2014   . COPD (chronic obstructive pulmonary disease) (CMS HCC)    . Fistula    . HTN (hypertension)    . Hyperlipidemia    . Obesity    . Sleep apnea    . Ventral hernia        Past Surgical History:   Procedure Laterality Date   . ABCESS DRAINAGE     . BOWEL RESECTION     . CATARACT EXTRACTION     . HX CERVICAL CONE BIOPSY      . HX CESAREAN SECTION     . HX GASTRIC BYPASS      25 years ago   . [place] (x2)   . HX HYSTERECTOMY      late 1990s   . HX LAP BANDING      2013   . HX LAPAROTOMY      2013 to remove lap band       Family Medical History:     Problem Relation (Age of Onset)    Diabetes Mother, Maternal  Grandmother, Maternal Grandfather, Father    Heart Attack Mother    Stroke Father          Current Outpatient Medications   Medication Sig   . acetaminophen (TYLENOL) 325 mg Oral Tablet Take 2 Tabs (650 mg total) by mouth Every 6 hours as needed (Patient not taking: Reported on 08/25/2018)   . albuterol sulfate (PROVENTIL) 2.5 mg /3 mL (0.083 %) Inhalation Solution for Nebulization 3 mL (2.5 mg total) by Nebulization route Every 6 hours   . amiodarone (PACERONE) 200 mg Oral Tablet Take 1 Tab (200 mg total) by mouth Once a day   . apixaban (ELIQUIS) 5 mg Oral Tablet Take 1 Tab (5 mg total) by mouth Twice daily   . atorvastatin (LIPITOR) 10 mg Oral Tablet Take 10 mg by mouth Once a day   . budesonide-formoterol (SYMBICORT) 160-4.5 mcg/actuation Inhalation HFA Aerosol Inhaler Take 2 Puffs by inhalation Twice daily   . CARVEDILOL ORAL Take 6.25 mg by mouth Twice daily 3 tablets   . docusate sodium (COLACE) 100 mg Oral Capsule Take 100 mg by mouth Three times a day as needed    . DULoxetine (CYMBALTA DR) 30 mg Oral Capsule, Delayed Release(E.C.) Take 30 mg by mouth Once a day   . linaCLOtide (LINZESS) 72 mcg Oral Capsule Take 72 mcg by mouth Every morning   . loperamide (IMODIUM) 2 mg Oral Capsule Take 2 mg by mouth Every 4 hours as needed   . loratadine (CLARITIN) 10 mg Oral Tablet Take 1 Tab (10 mg total) by mouth Once a day   . magnesium oxide (MAG-OX) 400 mg Oral Tablet Take 400 mg by mouth Once a day   . omeprazole (PRILOSEC) 20 mg Oral Capsule, Delayed Release(E.C.) Take 40 mg by mouth Once a day    . oxyCODONE (ROXICODONE) 5 mg Oral Tablet Take 1 Tab (5 mg total) by mouth Every 4 hours as needed for Pain   . potassium chloride (K-DUR) 20 mEq Oral Tab Sust.Rel. Particle/Crystal Take 20 mEq by mouth Once a day   . prochlorperazine (COMPAZINE) 10 mg Oral Tablet Take 1 Tab (10 mg total) by mouth Four times a day as needed for nausea/vomiting   .  rOPINIRole (REQUIP) 0.5 mg Oral Tablet Take 0.5 mg by mouth Every night          Social History     Socioeconomic History   . Marital status: Married     Spouse name: Not on file   . Number of children: Not on file   . Years of education: Not on file   . Highest education level: Not on file   Tobacco Use   . Smoking status: Former Research scientist (life sciences)   . Smokeless tobacco: Never Used   . Tobacco comment: quit 10 yrs ago   Substance and Sexual Activity   . Alcohol use: Not Currently     Comment: rarely   . Drug use: No   Other Topics Concern   . Ability to Walk 1 Flight of Steps without SOB/CP No   . Ability To Do Own ADL's Yes       PHYSICAL EXAMINATION:  VITALS:   Vitals:    09/17/18 0841   BP: 122/64   Pulse: 89   Resp: 16   Temp: 35.4 C (95.8 F)   TempSrc: Thermal Scan   SpO2: 92%   Weight: 126.1 kg (278 lb)   Height: 1.575 m (5' 2" )   BMI: 50.95       PHYSICAL EXAM:   Consitutional: No acute distress.    Eyes: EOMI. No discharge. No Jaundice.   ENT: mucous membranes dry. No posterior pharynx lesions.. Neck supple, No palpable masses   Heme/ Lymph: No Cervical, Inguinal, axillary lymh nodes. No Bruising.   Cardiovascular: S1, S2, No murmurs, rubs, or gallops.   Respiratory: Clear to auscultate and percuss B/L, No rhonchi, crackles, or wheezing.   Abdomen: Normal Bowel Sounds, nontender nondistended. No hepatosplenomegaly.   Musculoskeletal: No Edema to the extremities.   Skin: Normal turgor. No Rashes,skin lesions   Psychiatry: Normal Affect   Neuro: No focal deficits. Alert and Oriented x 3      ENCOUNTER ORDERS:  Orders Placed This Encounter   . [place] PET MW:NUUV (HEAD TO THIGH) WO IV CONTRAST       LABORATORY/RADIOLOGICAL DATA: All pertinent labs/radiology were reviewed.      Ref. Range 09/17/2018 08:24   WBC Latest Ref Range: 3.5 - 10.3 x10^3/uL 1.3 (L)   HGB Latest Ref Range: 11.8 - 15.8 g/dL 10.7 (L)   HCT Latest Ref Range: 34.6 - 46.2 % 32.8 (L)   PLATELET COUNT Latest Ref Range: 140 - 440 x10^3/uL 207   RBC Latest Ref Range: 3.80 - 5.24 x10^6/uL 3.94   MCV Latest Ref Range: 82.3 - 96.7 fL 83.4    MCHC Latest Ref Range: 32.6 - 35.4 g/dL 32.5 (L)   MCH Latest Ref Range: 27.6 - 33.2 pg 27.1 (L)   RDW Latest Ref Range: 12.4 - 15.2 % 19.6 (H)   MPV Latest Ref Range: 6.6 - 10.2 fL 8.0   PMN'S Latest Units: % 59   LYMPHOCYTES Latest Units: % 26   EOSINOPHIL Latest Units: % 1   MONOCYTES Latest Units: % 12   BASOPHILS Latest Units: % 1   PMN ABS Latest Ref Range: 1.50 - 6.40 x10^3/uL 0.80 (L)   LYMPHS ABS Latest Ref Range: 0.90 - 3.40 x10^3/uL 0.30 (L)   EOS ABS Latest Ref Range: 0.00 - 0.50 x10^3/uL 0.00   MONOS ABS Latest Ref Range: 0.20 - 0.90 x10^3/uL 0.20   BASOS ABS Latest Ref Range: 0.00 - 0.20 x10^3/uL 0.00   SODIUM Latest Ref Range: 135 -  145 mmol/L 136   POTASSIUM Latest Ref Range: 3.5 - 5.0 mmol/L 4.3   CHLORIDE Latest Ref Range: 98 - 111 mmol/L 105   CARBON DIOXIDE Latest Ref Range: 21 - 35 mmol/L 24   BUN Latest Ref Range: 10 - 25 mg/dL 17   CREATININE Latest Ref Range: <=1.30 mg/dL 0.89   GLUCOSE Latest Ref Range: 70 - 110 mg/dL 160 (H)   ANION GAP Latest Units: mmol/L 7   BUN/CREAT RATIO Unknown 19   ESTIMATED GLOMERULAR FILTRATION RATE Latest Ref Range: Avg: 85 mL/min/1.66m2 >60   CALCIUM Latest Ref Range: 8.8 - 10.3 mg/dL 8.9   TOTAL PROTEIN Latest Ref Range: 6.0 - 8.3 g/dL 6.2   ALBUMIN Latest Ref Range: 3.2 - 4.6 g/dL 3.7   BILIRUBIN, TOTAL Latest Ref Range: 0.3 - 1.2 mg/dL 0.7   AST (SGOT) Latest Ref Range: <=35 U/L 55 (H)   ALT (SGPT) Latest Ref Range: <=52 U/L 53 (H)   ALKALINE PHOSPHATASE Latest Ref Range: 20 - 130 U/L 82       ASSESSMENT AND PLAN:  68y.o. female with history of stage III colon cancer status post 5 cycle of FOLFOX now presenting with stage III lung cancer.  I explained the reason for referred to Hematology/Oncology Clinic.    ICD-10-CM    1. Malignant neoplasm of lung, unspecified laterality, unspecified part of lung (CMS HCC) C34.90 [place] PET CKM:MNOT(HEAD TO THIGH) WO IV CONTRAST   2. Neutropenia (CMS HCC) D70.9    3. CINV (chemotherapy-induced nausea and vomiting) R11.2      T45.1X5A    4. Transaminitis R74.0    5. Thrombocytopenia (CMS HCC) D69.6    6. Back pain, unspecified back location, unspecified back pain laterality, unspecified chronicity M54.9    7. Encounter for antineoplastic chemotherapy Z51.11        1. Adenocarcinoma lung:  Stage III.    CT brain to rule out metastatic disease.  NEGATIVE.   Started on chemoradiation carboplatin/Taxol along with radiation- July 23, 2018.  After completion of chemo radiation patient will get PET scan.  If continued to have good response will start maintenance immunotherapy for 1 year.   PET ordered for 4 week post radiation therapy   Completed 6/6 cycles of Taxol/Carboplatin and will start maintenance immunotherapy therapy following radiation therapy and PET scan    CHEMOTHERAPY:/RADIATION  CYCLE 6 DAY   DRUG DOSE TOTAL DOSE % ROUTE SCHEDULE   Carboplatin AUC- 2 205 mg    COMPLETED   Paclitaxel 45 mg/m2 108 mg    COMPLETED                     Shared decision.  I discussed diagnosis, prognosis and treatment option with patient and family.  I had detailed discussion with patient regarding chemotherapy and radiation.  I also discussed brain scan to rule out metastatic disease before starting treatment.  I counseled patient regarding chemotherapy side effects and their management.  Patient understand and want to proceed with treatment.      2. Neutropenia:  Due to chemotherapy.   ANC 800.     3. CINV    Zofran PRN    4. Transaminitis:   resolved.    5. Thrombocytopenia:  Resolved   Resolved.   platelets today 207    6. Pain   Per ADeatra CanterHydrocodone for pain management     Follow-up in 1 week to ensure counts have recovered.     Return  in about 1 week (around 09/24/2018) for cbc/diff, CMP, EXAM, courtney.    Omer Jack, APRN, FNP-C    I saw and examined the patient.  I reviewed the NP s note.  I discussed the findings, assessment and plan of care as documented in the NP note and agree with documentation above.  Any  exceptions/additions are edited/noted.      Vanetta Mulders, MD  Hematology/Oncology

## 2018-09-17 NOTE — Addendum Note (Signed)
Addended byVanetta Mulders on: 09/17/2018 12:58 PM     Modules accepted: Level of Service

## 2018-09-18 ENCOUNTER — Inpatient Hospital Stay (HOSPITAL_COMMUNITY)
Admission: RE | Admit: 2018-09-18 | Discharge: 2018-09-18 | Disposition: A | Discharge status: Still In Hospital | Payer: Medicare Other | Source: Ambulatory Visit

## 2018-09-18 MED ORDER — OXYCODONE 5 MG TABLET: 5 mg | Tab | Freq: Three times a day (TID) | ORAL | 0 refills | 0 days | Status: DC | PRN

## 2018-09-18 NOTE — Addendum Note (Signed)
Addended byVanetta Mulders on: 09/18/2018 09:44 AM     Modules accepted: Orders

## 2018-09-19 ENCOUNTER — Inpatient Hospital Stay (HOSPITAL_COMMUNITY)
Admission: RE | Admit: 2018-09-19 | Discharge: 2018-09-19 | Disposition: A | Discharge status: Still In Hospital | Payer: Medicare Other | Source: Ambulatory Visit

## 2018-09-22 ENCOUNTER — Inpatient Hospital Stay (HOSPITAL_COMMUNITY)
Admission: RE | Admit: 2018-09-22 | Discharge: 2018-09-22 | Disposition: A | Discharge status: Still In Hospital | Payer: Medicare Other | Source: Ambulatory Visit

## 2018-09-23 ENCOUNTER — Other Ambulatory Visit: Payer: Self-pay

## 2018-09-23 ENCOUNTER — Inpatient Hospital Stay (HOSPITAL_COMMUNITY)
Admission: RE | Admit: 2018-09-23 | Discharge: 2018-09-23 | Disposition: A | Discharge status: Still In Hospital | Payer: Medicare Other | Source: Ambulatory Visit

## 2018-09-23 ENCOUNTER — Inpatient Hospital Stay
Admission: RE | Admit: 2018-09-23 | Discharge: 2018-09-23 | Disposition: A | Discharge status: Still In Hospital | Payer: Medicare Other | Source: Ambulatory Visit | Attending: Internal Medicine | Admitting: Internal Medicine

## 2018-09-23 NOTE — Progress Notes (Signed)
S. At 5940 cGy. Having some skin irritation on back.  O. Minimal erythema on back.  A/P Stable. Begin Aquafor ointment on back BID

## 2018-09-23 NOTE — Nurses Notes (Signed)
Wt 287 lbs. Pt in room 2 for lung OTV. Pt denies any complaints or changes to medication/allergies. Jimmye Norman, RN

## 2018-09-24 ENCOUNTER — Ambulatory Visit (HOSPITAL_BASED_OUTPATIENT_CLINIC_OR_DEPARTMENT_OTHER): Payer: Medicare Other | Admitting: Nurse Practitioner

## 2018-09-24 ENCOUNTER — Ambulatory Visit (HOSPITAL_COMMUNITY): Payer: Medicare Other

## 2018-09-24 ENCOUNTER — Ambulatory Visit (HOSPITAL_COMMUNITY)
Admission: RE | Admit: 2018-09-24 | Discharge: 2018-09-24 | Disposition: A | Discharge status: Home / Self Care | Payer: Medicare Other | Source: Ambulatory Visit

## 2018-09-24 ENCOUNTER — Inpatient Hospital Stay (HOSPITAL_COMMUNITY)
Admission: RE | Admit: 2018-09-24 | Discharge: 2018-09-24 | Disposition: A | Discharge status: Still In Hospital | Payer: Medicare Other | Source: Ambulatory Visit

## 2018-09-24 ENCOUNTER — Ambulatory Visit (HOSPITAL_COMMUNITY)
Admission: RE | Admit: 2018-09-24 | Discharge: 2018-09-24 | Disposition: A | Discharge status: Home / Self Care | Payer: Medicare Other | Source: Ambulatory Visit | Attending: Nurse Practitioner | Admitting: Nurse Practitioner

## 2018-09-24 ENCOUNTER — Encounter (HOSPITAL_COMMUNITY): Payer: Self-pay | Admitting: Nurse Practitioner

## 2018-09-24 VITALS — BP 126/48 | HR 79 | Temp 95.9°F | Resp 16 | Ht 62.0 in | Wt 286.0 lb

## 2018-09-24 DIAGNOSIS — R05 Cough: Secondary | ICD-10-CM

## 2018-09-24 DIAGNOSIS — R112 Nausea with vomiting, unspecified: Secondary | ICD-10-CM | POA: Insufficient documentation

## 2018-09-24 DIAGNOSIS — Z5111 Encounter for antineoplastic chemotherapy: Secondary | ICD-10-CM

## 2018-09-24 DIAGNOSIS — C189 Malignant neoplasm of colon, unspecified: Secondary | ICD-10-CM | POA: Insufficient documentation

## 2018-09-24 DIAGNOSIS — R52 Pain, unspecified: Secondary | ICD-10-CM | POA: Insufficient documentation

## 2018-09-24 DIAGNOSIS — I4891 Unspecified atrial fibrillation: Secondary | ICD-10-CM | POA: Insufficient documentation

## 2018-09-24 DIAGNOSIS — D709 Neutropenia, unspecified: Secondary | ICD-10-CM

## 2018-09-24 DIAGNOSIS — D696 Thrombocytopenia, unspecified: Secondary | ICD-10-CM | POA: Insufficient documentation

## 2018-09-24 DIAGNOSIS — R059 Cough, unspecified: Secondary | ICD-10-CM

## 2018-09-24 DIAGNOSIS — R509 Fever, unspecified: Secondary | ICD-10-CM | POA: Insufficient documentation

## 2018-09-24 DIAGNOSIS — T451X5A Adverse effect of antineoplastic and immunosuppressive drugs, initial encounter: Secondary | ICD-10-CM

## 2018-09-24 DIAGNOSIS — C3490 Malignant neoplasm of unspecified part of unspecified bronchus or lung: Secondary | ICD-10-CM

## 2018-09-24 DIAGNOSIS — D701 Agranulocytosis secondary to cancer chemotherapy: Secondary | ICD-10-CM

## 2018-09-24 DIAGNOSIS — M549 Dorsalgia, unspecified: Secondary | ICD-10-CM | POA: Insufficient documentation

## 2018-09-24 DIAGNOSIS — Z510 Encounter for antineoplastic radiation therapy: Secondary | ICD-10-CM | POA: Insufficient documentation

## 2018-09-24 LAB — COMPREHENSIVE METABOLIC PANEL, NON-FASTING
ALBUMIN: 3.5 g/dL (ref 3.2–4.6)
ALKALINE PHOSPHATASE: 74 U/L (ref 20–130)
ALT (SGPT): 27 U/L (ref <=52)
ANION GAP: 5 mmol/L
AST (SGOT): 30 U/L (ref <=35)
BILIRUBIN TOTAL: 0.8 mg/dL (ref 0.3–1.2)
BUN/CREA RATIO: 20
BUN: 15 mg/dL (ref 10–25)
CALCIUM: 8.4 mg/dL — ABNORMAL LOW (ref 8.8–10.3)
CHLORIDE: 108 mmol/L (ref 98–111)
CO2 TOTAL: 25 mmol/L (ref 21–35)
CREATININE: 0.75 mg/dL (ref <=1.30)
ESTIMATED GFR: >60 mL/min/1.73mˆ2
GLUCOSE: 164 mg/dL — ABNORMAL HIGH (ref 70–110)
POTASSIUM: 3.6 mmol/L (ref 3.5–5.0)
PROTEIN TOTAL: 5.8 g/dL — ABNORMAL LOW (ref 6.0–8.3)
SODIUM: 138 mmol/L (ref 135–145)

## 2018-09-24 LAB — URINALYSIS, MACRO/MICRO
BLOOD: NEGATIVE mg/dL
GLUCOSE: NEGATIVE mg/dL
KETONES: NEGATIVE mg/dL
LEUKOCYTES: NEGATIVE WBCs/uL
NITRITE: NEGATIVE
PH: 5 (ref 5.0–7.0)
PROTEIN: NEGATIVE mg/dL
SPECIFIC GRAVITY: 1.025 (ref 1.010–1.025)
UROBILINOGEN: 1 mg/dL

## 2018-09-24 LAB — CBC WITH DIFF
BASOPHIL #: 0 x10ˆ3/uL (ref 0.00–0.20)
BASOPHIL %: 1 %
EOSINOPHIL #: 0 x10ˆ3/uL (ref 0.00–0.50)
EOSINOPHIL %: 1 %
HCT: 32.5 % — ABNORMAL LOW (ref 34.6–46.2)
HGB: 10.6 g/dL — ABNORMAL LOW (ref 11.8–15.8)
LYMPHOCYTE #: 0.3 x10ˆ3/uL — ABNORMAL LOW (ref 0.90–3.40)
LYMPHOCYTE %: 17 %
MCH: 28 pg (ref 27.6–33.2)
MCHC: 32.6 g/dL (ref 32.6–35.4)
MCV: 85.7 fL (ref 82.3–96.7)
MONOCYTE #: 0.5 x10ˆ3/uL (ref 0.20–0.90)
MONOCYTE %: 27 %
MPV: 7.5 fL (ref 6.6–10.2)
NEUTROPHIL #: 1 x10ˆ3/uL — ABNORMAL LOW (ref 1.50–6.40)
NEUTROPHIL %: 54 %
PLATELETS: 143 x10ˆ3/uL (ref 140–440)
RBC: 3.79 x10ˆ6/uL — ABNORMAL LOW (ref 3.80–5.24)
RDW: 22.8 % — ABNORMAL HIGH (ref 12.4–15.2)
WBC: 1.8 x10ˆ3/uL — ABNORMAL LOW (ref 3.5–10.3)

## 2018-09-24 MED ORDER — LEVOFLOXACIN 500 MG TABLET
500.0000 mg | ORAL_TABLET | Freq: Every day | ORAL | 0 refills | Status: DC
Start: 2018-09-24 — End: 2018-09-29

## 2018-09-24 NOTE — Progress Notes (Signed)
[place]  [place] [address]        Encounter Date: 09/24/2018   9:15 AM EST    Name:  Kelsey [name]  Age: 68 y.o.  DOB: 1951/06/23  Sex: female  PCP: [facility]    Chief Complaint:    Chief Complaint   Patient presents with   . Follow-up     HISTORY OF PRESENT ILLNESS:   68 y.o. female with history of stage III colon cancer of present evaluation and management of lung cancer.    1. September 2015. Patient underwent sigmoidoscopy and was found to have polyp which was adenocarcinoma.    2. June 10, 2014. She was admitted for segmental resection of sigmoid colon. This revealed pT3, N1a, MX, low-grade  adenocarcinoma of the colon. The mass measuring 4 x 3 x 0.5 centimeters. It  was low-grade and there was no evidence of microsatellite instability by  histology. Margins were uninvolved and 4 lymph nodes only were removed which  only 1 was involved. This corresponding stage IIIB, T3, N1a, M0, low-grade  sigmoid cancer.     3. July 07, 2014. Recommended adjuvant chemotherapy with FOLFOX.    4. July 27, 2014. Patient was started on adjuvant chemotherapy with FOLFOX.  Patient received only 5 treatments and chemotherapy was stopped due to poor tolerance.  After that patient lost follow-up.      5. October 2019. Patient was admitted to hospital with concern for  bowel obstruction.  CT chest showed lung nodules.  Patient was managed conservatively and discharged home.    6. May 06, 2018.  Patient underwent CT-guided biopsy of lung lesion.  Pathology showed well-differentiated adenocarcinoma.  Section shows well-differentiated adenocarcinoma which positive for CK 7 and TTF 1, negative for P 63 and CK 20.     7. June 23, 2018. PET scan showed Hypermetabolic right lower lobe pulmonary mass compatible with malignancy.  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 Hepatomegaly and  steatosis.     8. July 23, 2018. Patient was started on chemoradiation.    SUBJECTIVE:  Patient here for follow-up. Patient reports she had a fever of 100.3 last evening.  Patient reports she has been taking DayQuil and NyQuil due to a new cough.  Patient states I know I should be taking Tylenol but I have to get something to stop the cough. Patient denies any discolored phlegm and reports it is clear in color.  Patient also reports some nose bleeds the nurse easily stopped.  Patient denies any new shortness of breath or chest pain.  Patient denies any nausea vomiting or diarrhea.  Patient denies any chills or night sweats.       REVIEW OF SYSTEMS:  GENERAL:  Report low-grade fever, Denies chills.  HEENT: no sore throat, congestion, blurry vision  Lungs:  No shortness of breath, reports cough and congestion.  GI: no nausea, vomiting, constipation, no diarrhea  GU: No dysuria, urgency, increased frequency   Cardiac: no chest pains, palpitations, syncope,   Neuro: no weakness, loss of function, numbness, tingling  Skin: no rash  Musculoskeletal: No myalgias  Psychosocial: No depression, anxiety  Hematologic: No easy bruising, abnormal bleeding  All other review  ROS negative except those mentioned above.     PATIENT HISTORY:  Past Medical History:   Diagnosis Date   . A-fib (CMS HCC)    . Arthropathy, unspecified, site unspecified    . Asthma    . Cancer (CMS HCC)     cervical   . Cataract    . Colon cancer (CMS [facility]) 07/06/2014   . COPD (chronic obstructive pulmonary disease) (CMS HCC)    . Fistula    . HTN (hypertension)    . Hyperlipidemia    . Obesity    . Sleep apnea    . Ventral hernia        Past Surgical History:   Procedure Laterality Date   . ABCESS DRAINAGE     . BOWEL RESECTION     . CATARACT EXTRACTION     . HX CERVICAL CONE BIOPSY      . HX CESAREAN SECTION     . HX GASTRIC BYPASS      25 years ago   . [place] (x2)   . HX HYSTERECTOMY      late 1990s   . HX LAP BANDING      2013    . HX LAPAROTOMY      2013 to remove lap band       Family Medical History:     Problem Relation (Age of Onset)    Diabetes Mother, Maternal Grandmother, Maternal Grandfather, Father    Heart Attack Mother    Stroke Father          Current Outpatient Medications   Medication Sig   . acetaminophen (TYLENOL) 325 mg Oral Tablet Take 2 Tabs (650 mg total) by mouth Every 6 hours as needed (Patient not taking: Reported on 08/25/2018)   . albuterol sulfate (PROVENTIL) 2.5 mg /3 mL (0.083 %) Inhalation Solution for Nebulization 3 mL (2.5 mg total) by Nebulization route Every 6 hours   . amiodarone (PACERONE) 200 mg Oral Tablet Take 1 Tab (200 mg total) by mouth Once a day   . apixaban (ELIQUIS) 5 mg Oral Tablet Take 1 Tab (5 mg total) by mouth Twice daily   . atorvastatin (LIPITOR) 10 mg Oral Tablet Take 10 mg by mouth Once a day   . budesonide-formoterol (SYMBICORT) 160-4.5 mcg/actuation Inhalation HFA Aerosol Inhaler Take 2 Puffs by inhalation Twice daily   . CARVEDILOL ORAL Take 6.25 mg by mouth Twice daily 3 tablets   . docusate sodium (COLACE) 100 mg Oral Capsule Take 100 mg by mouth Three times a day as needed    . DULoxetine (CYMBALTA DR) 30 mg Oral Capsule, Delayed Release(E.C.) Take 30 mg by mouth Once a day   . levoFLOXacin (LEVAQUIN) 500 mg Oral Tablet Take 1 Tab (500 mg total) by mouth Once a day for 5 days   . linaCLOtide (LINZESS) 72 mcg Oral Capsule Take 72 mcg by mouth Every morning   . loperamide (IMODIUM) 2 mg Oral Capsule Take 2 mg by mouth Every 4 hours as needed   . loratadine (CLARITIN) 10 mg Oral Tablet Take 1 Tab (10 mg total) by mouth Once a day   . magnesium oxide (MAG-OX) 400 mg Oral Tablet Take 400 mg by mouth Once a day   . omeprazole (PRILOSEC) 20 mg Oral Capsule, Delayed Release(E.C.) Take 40 mg by mouth Once a day    . oxyCODONE (ROXICODONE) 5 mg Oral Tablet Take 1 Tab (5 mg total)  by mouth Every 8 hours as needed for Pain   . potassium chloride (K-DUR) 20 mEq Oral Tab Sust.Rel.  Particle/Crystal Take 20 mEq by mouth Once a day   . prochlorperazine (COMPAZINE) 10 mg Oral Tablet Take 1 Tab (10 mg total) by mouth Four times a day as needed for nausea/vomiting   . rOPINIRole (REQUIP) 0.5 mg Oral Tablet Take 0.5 mg by mouth Every night     Social History     Socioeconomic History   . Marital status: Married     Spouse name: Not on file   . Number of children: Not on file   . Years of education: Not on file   . Highest education level: Not on file   Tobacco Use   . Smoking status: Former Research scientist (life sciences)   . Smokeless tobacco: Never Used   . Tobacco comment: quit 10 yrs ago   Substance and Sexual Activity   . Alcohol use: Not Currently     Comment: rarely   . Drug use: No   Other Topics Concern   . Ability to Walk 1 Flight of Steps without SOB/CP No   . Ability To Do Own ADL's Yes       PHYSICAL EXAMINATION:  VITALS:   Vitals:    09/24/18 0856   BP: (!) 126/48   Pulse: 79   Resp: 16   Temp: 35.5 C (95.9 F)   TempSrc: Thermal Scan   SpO2: 97%   Weight: 129.7 kg (286 lb)   Height: 1.575 m (5' 2" )   BMI: 52.42       PHYSICAL EXAM:   Consitutional: No acute distress.    Eyes: EOMI. No discharge. No Jaundice.   ENT: mucous membranes dry. No posterior pharynx lesions.. Neck supple, No palpable masses   Heme/ Lymph: No Cervical, Inguinal, axillary lymh nodes. No Bruising.   Cardiovascular: S1, S2, No murmurs, rubs, or gallops.   Respiratory: Clear to auscultate and percuss B/L, No rhonchi, crackles, or wheezing.   Abdomen: Normal Bowel Sounds, nontender nondistended. No hepatosplenomegaly.   Musculoskeletal: No Edema to the extremities.   Skin: Normal turgor. No Rashes,skin lesions   Psychiatry: Normal Affect   Neuro: No focal deficits. Alert and Oriented x 3      ENCOUNTER ORDERS:  Orders Placed This Encounter   . ADULT ROUTINE BLOOD CULTURE, SET OF 2 BOTTLES (BACTERIA AND YEAST)   . ADULT ROUTINE BLOOD CULTURE, SET OF 2 BOTTLES (BACTERIA AND YEAST)   . XR CHEST AP AND LATERAL   . URINALYSIS WITH REFLEX  MICROSCOPIC AND CULTURE IF POSITIVE   . levoFLOXacin (LEVAQUIN) 500 mg Oral Tablet       LABORATORY/RADIOLOGICAL DATA: All pertinent labs/radiology were reviewed.     Results for JOIE, HIPPS (MRN [id number]) as of 09/24/2018 09:36   Ref. Range 09/24/2018 08:45   WBC Latest Ref Range: 3.5 - 10.3 x10^3/uL 1.8 (L)   HGB Latest Ref Range: 11.8 - 15.8 g/dL 10.6 (L)   HCT Latest Ref Range: 34.6 - 46.2 % 32.5 (L)   PLATELET COUNT Latest Ref Range: 140 - 440 x10^3/uL 143   RBC Latest Ref Range: 3.80 - 5.24 x10^6/uL 3.79 (L)   MCV Latest Ref Range: 82.3 - 96.7 fL 85.7   MCHC Latest Ref Range: 32.6 - 35.4 g/dL 32.6   MCH Latest Ref Range: 27.6 - 33.2 pg 28.0   RDW Latest Ref Range: 12.4 - 15.2 % 22.8 (H)   MPV Latest Ref Range: 6.6 -  10.2 fL 7.5   PMN'S Latest Units: % 54   LYMPHOCYTES Latest Units: % 17   EOSINOPHIL Latest Units: % 1   MONOCYTES Latest Units: % 27   BASOPHILS Latest Units: % 1   PMN ABS Latest Ref Range: 1.50 - 6.40 x10^3/uL 1.00 (L)   LYMPHS ABS Latest Ref Range: 0.90 - 3.40 x10^3/uL 0.30 (L)   EOS ABS Latest Ref Range: 0.00 - 0.50 x10^3/uL 0.00   MONOS ABS Latest Ref Range: 0.20 - 0.90 x10^3/uL 0.50   BASOS ABS Latest Ref Range: 0.00 - 0.20 x10^3/uL 0.00   SODIUM Latest Ref Range: 135 - 145 mmol/L 138   POTASSIUM Latest Ref Range: 3.5 - 5.0 mmol/L 3.6   CHLORIDE Latest Ref Range: 98 - 111 mmol/L 108   CARBON DIOXIDE Latest Ref Range: 21 - 35 mmol/L 25   BUN Latest Ref Range: 10 - 25 mg/dL 15   CREATININE Latest Ref Range: <=1.30 mg/dL 0.75   GLUCOSE Latest Ref Range: 70 - 110 mg/dL 164 (H)   ANION GAP Latest Units: mmol/L 5   BUN/CREAT RATIO Unknown 20   ESTIMATED GLOMERULAR FILTRATION RATE Latest Ref Range: Avg: 85 mL/min/1.49m2 >60   CALCIUM Latest Ref Range: 8.8 - 10.3 mg/dL 8.4 (L)   TOTAL PROTEIN Latest Ref Range: 6.0 - 8.3 g/dL 5.8 (L)   ALBUMIN Latest Ref Range: 3.2 - 4.6 g/dL 3.5   BILIRUBIN, TOTAL Latest Ref Range: 0.3 - 1.2 mg/dL 0.8   AST (SGOT) Latest Ref Range: <=35 U/L 30   ALT (SGPT) Latest  Ref Range: <=52 U/L 27   ALKALINE PHOSPHATASE Latest Ref Range: 20 - 130 U/L 74       ASSESSMENT AND PLAN:  68y.o. female with history of stage III colon cancer status post 5 cycle of FOLFOX now presenting with stage III lung cancer.  I explained the reason for referred to Hematology/Oncology Clinic.    ICD-10-CM    1. Neutropenia (CMS HCC) D70.9 URINALYSIS WITH REFLEX MICROSCOPIC AND CULTURE IF POSITIVE     XR CHEST AP AND LATERAL     ADULT ROUTINE BLOOD CULTURE, SET OF 2 BOTTLES (BACTERIA AND YEAST)     ADULT ROUTINE BLOOD CULTURE, SET OF 2 BOTTLES (BACTERIA AND YEAST)   2. Cough R05 URINALYSIS WITH REFLEX MICROSCOPIC AND CULTURE IF POSITIVE     XR CHEST AP AND LATERAL     ADULT ROUTINE BLOOD CULTURE, SET OF 2 BOTTLES (BACTERIA AND YEAST)     ADULT ROUTINE BLOOD CULTURE, SET OF 2 BOTTLES (BACTERIA AND YEAST)   3. Malignant neoplasm of lung, unspecified laterality, unspecified part of lung (CMS HCC) C34.90    4. CINV (chemotherapy-induced nausea and vomiting) R11.2     T45.1X5A    5. Thrombocytopenia (CMS HCC) D69.6    6. Back pain, unspecified back location, unspecified back pain laterality, unspecified chronicity M54.9    7. Fever, unspecified fever cause R50.9    8. Encounter for antineoplastic chemotherapy Z51.11        1. Adenocarcinoma lung:  Stage III.    CT brain to rule out metastatic disease.  NEGATIVE.   Started on chemoradiation carboplatin/Taxol along with radiation- July 23, 2018.  After completion of chemo radiation patient will get PET scan.  If continued to have good response will start maintenance immunotherapy for 1 year.   PET ordered for 4 week post radiation therapy Scheduled for 10/30/2018   Completed 6/6 cycles of Taxol/Carboplatin and will start maintenance immunotherapy therapy  following radiation therapy and PET scan    CHEMOTHERAPY:/RADIATION  CYCLE 6 DAY   DRUG DOSE TOTAL DOSE % ROUTE SCHEDULE   Carboplatin AUC- 2 205 mg    COMPLETED   Paclitaxel 45 mg/m2 108 mg    COMPLETED                      Shared decision.  I discussed diagnosis, prognosis and treatment option with patient and family.  I had detailed discussion with patient regarding chemotherapy and radiation.  I also discussed brain scan to rule out metastatic disease before starting treatment.  I counseled patient regarding chemotherapy side effects and their management.  Patient understand and want to proceed with treatment.      2. Neutropenia:  Due to chemotherapy.   ANC 1000    3. CINV    Zofran PRN    4. Transaminitis:   resolved.    5. Thrombocytopenia:  Resolved   Resolved.   platelets today 143    6. Pain   Per Deatra Canter Hydrocodone for pain management     7. Low grade fever @ home   Ordered blood cultures, UA, and CXR   Prophylactic levaquin ordered    Encouraged and educated on the importance of avoiding tylenol while neutropenic. Patient again verbalized understanding and stated I will call if fever returns.     Instructed patient to return on Monday to have labs obtained to ensure counts have recovered.  Also, instructed to call if any fever returns and avoid Tylenol products.     Return in about 5 weeks (around 10/31/2018) for cbc/diff, CMP, EXAM, Scans, Deatra Canter!!!!!!!!!!!!!!!!!!!!!!!!!!!!!!!!!!!!!!!!!!!!.    Omer Jack, APRN, FNP-C    The patient was seen independently by the Nurse Practitioner. I reviewed the patient's chart and agree with the NP's plan.     Vanetta Mulders, MD  Hematology/Oncology

## 2018-09-25 ENCOUNTER — Inpatient Hospital Stay (HOSPITAL_COMMUNITY)
Admission: RE | Admit: 2018-09-25 | Disposition: A | Discharge status: Still In Hospital | Payer: Medicare Other | Source: Ambulatory Visit

## 2018-09-25 ENCOUNTER — Inpatient Hospital Stay
Admission: EM | Admit: 2018-09-25 | Discharge: 2018-09-29 | DRG: 309 | Disposition: A | Discharge status: Home / Self Care | Payer: Medicare Other | Attending: Internal Medicine | Admitting: Internal Medicine

## 2018-09-25 ENCOUNTER — Emergency Department (HOSPITAL_COMMUNITY): Payer: Medicare Other

## 2018-09-25 ENCOUNTER — Ambulatory Visit (HOSPITAL_COMMUNITY)
Admission: RE | Admit: 2018-09-25 | Discharge: 2018-09-25 | Disposition: A | Discharge status: Home / Self Care | Payer: Medicare Other | Source: Ambulatory Visit

## 2018-09-25 ENCOUNTER — Encounter (HOSPITAL_COMMUNITY): Payer: Self-pay

## 2018-09-25 ENCOUNTER — Inpatient Hospital Stay (HOSPITAL_COMMUNITY): Payer: Medicare Other | Admitting: Hospitalist

## 2018-09-25 ENCOUNTER — Other Ambulatory Visit: Payer: Self-pay

## 2018-09-25 DIAGNOSIS — J449 Chronic obstructive pulmonary disease, unspecified: Secondary | ICD-10-CM | POA: Diagnosis present

## 2018-09-25 DIAGNOSIS — I495 Sick sinus syndrome: Secondary | ICD-10-CM

## 2018-09-25 DIAGNOSIS — R509 Fever, unspecified: Secondary | ICD-10-CM | POA: Diagnosis present

## 2018-09-25 DIAGNOSIS — R9431 Abnormal electrocardiogram [ECG] [EKG]: Secondary | ICD-10-CM

## 2018-09-25 DIAGNOSIS — G2581 Restless legs syndrome: Secondary | ICD-10-CM | POA: Diagnosis present

## 2018-09-25 DIAGNOSIS — E785 Hyperlipidemia, unspecified: Secondary | ICD-10-CM | POA: Diagnosis present

## 2018-09-25 DIAGNOSIS — Z6841 Body Mass Index (BMI) 40.0 and over, adult: Secondary | ICD-10-CM

## 2018-09-25 DIAGNOSIS — Z9884 Bariatric surgery status: Secondary | ICD-10-CM

## 2018-09-25 DIAGNOSIS — T451X5A Adverse effect of antineoplastic and immunosuppressive drugs, initial encounter: Secondary | ICD-10-CM | POA: Diagnosis present

## 2018-09-25 DIAGNOSIS — D701 Agranulocytosis secondary to cancer chemotherapy: Secondary | ICD-10-CM | POA: Diagnosis present

## 2018-09-25 DIAGNOSIS — I10 Essential (primary) hypertension: Secondary | ICD-10-CM | POA: Diagnosis present

## 2018-09-25 DIAGNOSIS — D696 Thrombocytopenia, unspecified: Secondary | ICD-10-CM | POA: Diagnosis present

## 2018-09-25 DIAGNOSIS — F39 Unspecified mood [affective] disorder: Secondary | ICD-10-CM | POA: Diagnosis present

## 2018-09-25 DIAGNOSIS — Z85038 Personal history of other malignant neoplasm of large intestine: Secondary | ICD-10-CM

## 2018-09-25 DIAGNOSIS — I480 Paroxysmal atrial fibrillation: Principal | ICD-10-CM | POA: Diagnosis present

## 2018-09-25 DIAGNOSIS — C7801 Secondary malignant neoplasm of right lung: Secondary | ICD-10-CM | POA: Diagnosis present

## 2018-09-25 DIAGNOSIS — K219 Gastro-esophageal reflux disease without esophagitis: Secondary | ICD-10-CM | POA: Diagnosis present

## 2018-09-25 DIAGNOSIS — C3490 Malignant neoplasm of unspecified part of unspecified bronchus or lung: Secondary | ICD-10-CM | POA: Insufficient documentation

## 2018-09-25 DIAGNOSIS — G4730 Sleep apnea, unspecified: Secondary | ICD-10-CM | POA: Diagnosis present

## 2018-09-25 DIAGNOSIS — Z87891 Personal history of nicotine dependence: Secondary | ICD-10-CM

## 2018-09-25 DIAGNOSIS — I4891 Unspecified atrial fibrillation: Secondary | ICD-10-CM

## 2018-09-25 DIAGNOSIS — Z7951 Long term (current) use of inhaled steroids: Secondary | ICD-10-CM

## 2018-09-25 DIAGNOSIS — C771 Secondary and unspecified malignant neoplasm of intrathoracic lymph nodes: Secondary | ICD-10-CM | POA: Diagnosis present

## 2018-09-25 DIAGNOSIS — Z7901 Long term (current) use of anticoagulants: Secondary | ICD-10-CM

## 2018-09-25 DIAGNOSIS — I493 Ventricular premature depolarization: Secondary | ICD-10-CM

## 2018-09-25 LAB — CBC WITH DIFF
BASOPHIL #: 0 x10ˆ3/uL (ref 0.00–0.20)
BASOPHIL #: 0 x10ˆ3/uL (ref 0.00–0.20)
BASOPHIL %: 1 %
BASOPHIL %: 1 %
EOSINOPHIL #: 0 x10ˆ3/uL (ref 0.00–0.50)
EOSINOPHIL #: 0 x10ˆ3/uL (ref 0.00–0.50)
EOSINOPHIL %: 1 %
EOSINOPHIL %: 0 %
HCT: 34.8 % (ref 34.6–46.2)
HCT: 36.3 % (ref 34.6–46.2)
HGB: 11.4 g/dL — ABNORMAL LOW (ref 11.8–15.8)
HGB: 11.8 g/dL (ref 11.8–15.8)
LYMPHOCYTE #: 0.4 x10ˆ3/uL — ABNORMAL LOW (ref 0.90–3.40)
LYMPHOCYTE #: 0.6 x10?3/uL — ABNORMAL LOW (ref 0.90–3.40)
LYMPHOCYTE #: 0.6 x10ˆ3/uL — ABNORMAL LOW (ref 0.90–3.40)
LYMPHOCYTE %: 18 %
LYMPHOCYTE %: 18 %
MCH: 27.8 pg (ref 27.6–33.2)
MCH: 27.7 pg (ref 27.6–33.2)
MCHC: 32.9 g/dL (ref 32.6–35.4)
MCHC: 32.7 g/dL (ref 32.6–35.4)
MCV: 84.7 fL (ref 82.3–96.7)
MCV: 84.8 fL (ref 82.3–96.7)
MONOCYTE #: 0.6 x10ˆ3/uL (ref 0.20–0.90)
MONOCYTE #: 0.8 x10ˆ3/uL (ref 0.20–0.90)
MONOCYTE %: 24 %
MONOCYTE %: 25 %
MPV: 7.6 fL (ref 6.6–10.2)
MPV: 7.6 fL (ref 6.6–10.2)
MPV: 7.8 fL (ref 6.6–10.2)
NEUTROPHIL #: 1.3 x10ˆ3/uL — ABNORMAL LOW (ref 1.50–6.40)
NEUTROPHIL #: 1.7 10*3/uL (ref 1.50–6.40)
NEUTROPHIL %: 56 %
NEUTROPHIL %: 55 %
PLATELETS: 152 x10ˆ3/uL (ref 140–440)
PLATELETS: 171 10*3/uL (ref 140–440)
RBC: 4.11 x10ˆ6/uL (ref 3.80–5.24)
RBC: 4.28 x10ˆ6/uL (ref 3.80–5.24)
RDW: 23 % — ABNORMAL HIGH (ref 12.4–15.2)
RDW: 23 % — ABNORMAL HIGH (ref 12.4–15.2)
RDW: 23 % — ABNORMAL HIGH (ref 12.4–15.2)
WBC: 2.3 x10ˆ3/uL — ABNORMAL LOW (ref 3.5–10.3)
WBC: 3 x10ˆ3/uL — ABNORMAL LOW (ref 3.5–10.3)

## 2018-09-25 LAB — COMPREHENSIVE METABOLIC PANEL, NON-FASTING
ALBUMIN: 3.8 g/dL (ref 3.2–4.6)
ALKALINE PHOSPHATASE: 78 U/L (ref 20–130)
ALKALINE PHOSPHATASE: 78 U/L (ref 20–130)
ALT (SGPT): 29 U/L (ref <=52)
ANION GAP: 5 mmol/L
ANION GAP: 5 mmol/L
AST (SGOT): 35 U/L (ref <=35)
BILIRUBIN TOTAL: 0.8 mg/dL (ref 0.3–1.2)
BUN: 14 mg/dL (ref 10–25)
CALCIUM: 8.8 mg/dL (ref 8.8–10.3)
CHLORIDE: 107 mmol/L (ref 98–111)
CO2 TOTAL: 26 mmol/L (ref 21–35)
CREATININE: 0.74 mg/dL (ref <=1.30)
ESTIMATED GFR: >60 mL/min/{1.73_m2}
GLUCOSE: 159 mg/dL — ABNORMAL HIGH (ref 70–110)
POTASSIUM: 4 mmol/L (ref 3.5–5.0)
PROTEIN TOTAL: 6.1 g/dL (ref 6.0–8.3)
SODIUM: 138 mmol/L (ref 135–145)

## 2018-09-25 LAB — BASIC METABOLIC PANEL
ANION GAP: 8 mmol/L
BUN/CREA RATIO: 19
BUN: 14 mg/dL (ref 10–25)
CALCIUM: 8.7 mg/dL — ABNORMAL LOW (ref 8.8–10.3)
CHLORIDE: 107 mmol/L (ref 98–111)
CO2 TOTAL: 24 mmol/L (ref 21–35)
CREATININE: 0.75 mg/dL (ref <=1.30)
ESTIMATED GFR: >60 mL/min/1.73mˆ2
GLUCOSE: 153 mg/dL — ABNORMAL HIGH (ref 70–110)
POTASSIUM: 4.2 mmol/L (ref 3.5–5.0)
SODIUM: 139 mmol/L (ref 135–145)

## 2018-09-25 LAB — TROPONIN-I
TROPONIN I: 0.01 ng/mL (ref <=0.04)
TROPONIN I: 0.05 ng/mL (ref <=0.04)
TROPONIN I: 0.01 ng/mL (ref <=0.04)

## 2018-09-25 LAB — LACTIC ACID - FIRST REFLEX: LACTIC ACID: 1.3 mmol/L (ref <2.0)

## 2018-09-25 LAB — MAGNESIUM: MAGNESIUM: 1.7 mg/dL — ABNORMAL LOW (ref 1.8–2.3)

## 2018-09-25 LAB — THYROID STIMULATING HORMONE (SENSITIVE TSH): TSH: 2.869 u[IU]/mL (ref 0.450–5.330)

## 2018-09-25 LAB — INFLUENZA VIRUS TYPE A AND TYPE B, AND RESPIRATORY SYNCYTIAL VIRUS (RSV), PCR
INFLUENZA VIRUS TYPE A: NEGATIVE
INFLUENZA VIRUS TYPE B: NEGATIVE
INFLUENZA VIRUS TYPE B: NEGATIVE
RESPIRATORY SYNCYTIAL VIRUS (RSV): NEGATIVE

## 2018-09-25 LAB — PROCALCITONIN: PROCALCITONIN: 0.1 ng/mL (ref <0.50)

## 2018-09-25 LAB — PT/INR: INR: 1.22 — ABNORMAL HIGH (ref 0.80–1.10)

## 2018-09-25 LAB — LACTIC ACID LEVEL W/ REFLEX FOR LEVEL >2.0: LACTIC ACID: 2.1 mmol/L (ref <2.0)

## 2018-09-25 MED ORDER — SODIUM CHLORIDE 0.9 % (FLUSH) INJECTION SYRINGE
3.0000 mL | INJECTION | Freq: Three times a day (TID) | INTRAMUSCULAR | Status: DC
Start: 2018-09-25 — End: 2018-09-29
  Administered 2018-09-25 – 2018-09-29 (×12): 0 mL

## 2018-09-25 MED ORDER — PANTOPRAZOLE 20 MG TABLET,DELAYED RELEASE
20.00 mg | DELAYED_RELEASE_TABLET | Freq: Every morning | ORAL | Status: DC
Start: 2018-09-26 — End: 2018-09-29
  Administered 2018-09-26 – 2018-09-29 (×4): 20 mg via ORAL
  Filled 2018-09-25 (×6): qty 1

## 2018-09-25 MED ORDER — SODIUM CHLORIDE 0.9 % (FLUSH) INJECTION SYRINGE
3.0000 mL | INJECTION | INTRAMUSCULAR | Status: DC | PRN
Start: 2018-09-25 — End: 2018-09-29

## 2018-09-25 MED ORDER — APIXABAN 5 MG TABLET
5.00 mg | ORAL_TABLET | Freq: Two times a day (BID) | ORAL | Status: DC
Start: 2018-09-25 — End: 2018-09-29
  Administered 2018-09-25 – 2018-09-29 (×6): 5 mg via ORAL
  Filled 2018-09-25 (×11): qty 1

## 2018-09-25 MED ORDER — ROPINIROLE 0.5 MG TABLET
0.50 mg | ORAL_TABLET | Freq: Every evening | ORAL | Status: DC
Start: 2018-09-25 — End: 2018-09-29
  Administered 2018-09-25 – 2018-09-28 (×4): 0.5 mg via ORAL
  Filled 2018-09-25 (×6): qty 1

## 2018-09-25 MED ORDER — DILTIAZEM 5 MG/ML INTRAVENOUS SOLUTION
10.00 mg | INTRAVENOUS | Status: AC
Start: 2018-09-25 — End: 2018-09-25
  Administered 2018-09-25: 10 mg via INTRAVENOUS
  Filled 2018-09-25: qty 5

## 2018-09-25 MED ORDER — OXYCODONE 5 MG TABLET
5.00 mg | ORAL_TABLET | Freq: Three times a day (TID) | ORAL | Status: DC | PRN
Start: 2018-09-25 — End: 2018-09-29
  Administered 2018-09-25 – 2018-09-28 (×6): 5 mg via ORAL
  Filled 2018-09-25 (×6): qty 1

## 2018-09-25 MED ORDER — LINACLOTIDE 72 MCG CAPSULE
72.00 ug | ORAL_CAPSULE | Freq: Every morning | ORAL | Status: DC
Start: 2018-09-26 — End: 2018-09-29
  Administered 2018-09-26 – 2018-09-29 (×4): 72 ug via ORAL
  Filled 2018-09-25 (×6): qty 1

## 2018-09-25 MED ORDER — PROCHLORPERAZINE MALEATE 10 MG TABLET
10.00 mg | ORAL_TABLET | Freq: Four times a day (QID) | ORAL | Status: DC | PRN
Start: 2018-09-25 — End: 2018-09-29
  Filled 2018-09-25: qty 1

## 2018-09-25 MED ORDER — DULOXETINE 30 MG CAPSULE,DELAYED RELEASE
30.0000 mg | DELAYED_RELEASE_CAPSULE | Freq: Every day | ORAL | Status: DC
Start: 2018-09-26 — End: 2018-09-29
  Administered 2018-09-26 – 2018-09-29 (×4): 30 mg via ORAL
  Filled 2018-09-25 (×5): qty 1

## 2018-09-25 MED ORDER — AMIODARONE 200 MG TABLET
200.0000 mg | ORAL_TABLET | Freq: Every day | ORAL | Status: DC
Start: 2018-09-26 — End: 2018-09-25

## 2018-09-25 MED ORDER — ACETAMINOPHEN 325 MG TABLET
650.0000 mg | ORAL_TABLET | Freq: Four times a day (QID) | ORAL | Status: DC | PRN
Start: 2018-09-25 — End: 2018-09-29

## 2018-09-25 MED ORDER — LORATADINE 10 MG TABLET
10.0000 mg | ORAL_TABLET | Freq: Every day | ORAL | Status: DC
Start: 2018-09-26 — End: 2018-09-29
  Administered 2018-09-26 – 2018-09-29 (×4): 10 mg via ORAL
  Filled 2018-09-25 (×5): qty 1

## 2018-09-25 MED ORDER — SODIUM CHLORIDE 0.9 % IV BOLUS
1000.00 mL | INJECTION | Status: AC
Start: 2018-09-25 — End: 2018-09-25
  Administered 2018-09-25: 0 mL via INTRAVENOUS
  Administered 2018-09-25: 1000 mL via INTRAVENOUS

## 2018-09-25 MED ORDER — SODIUM CHLORIDE 0.9 % INTRAVENOUS SOLUTION
5.00 mg/h | INTRAVENOUS | Status: DC
Start: 2018-09-25 — End: 2018-09-25
  Administered 2018-09-25: 0 mg/h via INTRAVENOUS
  Administered 2018-09-25: 15 mg/h via INTRAVENOUS
  Administered 2018-09-25: 5 mg/h via INTRAVENOUS
  Administered 2018-09-25: 10 mg/h via INTRAVENOUS
  Administered 2018-09-25 (×2): 5 mg/h via INTRAVENOUS
  Administered 2018-09-25: 0 mg/h via INTRAVENOUS
  Filled 2018-09-25: qty 25

## 2018-09-25 MED ORDER — BUDESONIDE-FORMOTEROL HFA 160 MCG-4.5 MCG/ACTUATION - EAST
2.00 | INHALATION_SPRAY | Freq: Two times a day (BID) | RESPIRATORY_TRACT | Status: DC
Start: 2018-09-25 — End: 2018-09-29
  Administered 2018-09-25 – 2018-09-29 (×8): 2 via RESPIRATORY_TRACT
  Filled 2018-09-25: qty 6

## 2018-09-25 MED ORDER — DILTIAZEM 5 MG/ML INTRAVENOUS SOLUTION
10.00 mg | INTRAVENOUS | Status: AC
Start: 2018-09-25 — End: 2018-09-25
  Administered 2018-09-25: 10 mg via INTRAVENOUS

## 2018-09-25 MED ORDER — DEXTROSE 5 % IN WATER (D5W) INTRAVENOUS SOLUTION
150.0000 mg | Freq: Once | INTRAVENOUS | Status: AC
Start: 2018-09-25 — End: 2018-09-25
  Administered 2018-09-25: 150 mg via INTRAVENOUS
  Administered 2018-09-25: 0 mg via INTRAVENOUS
  Filled 2018-09-25: qty 3

## 2018-09-25 MED ORDER — AMIODARONE 50 MG/ML INTRAVENOUS SOLUTION
1.00 mg/min | INTRAVENOUS | Status: AC
Start: 2018-09-25 — End: 2018-09-26
  Administered 2018-09-25: 1 mg/min via INTRAVENOUS
  Filled 2018-09-25: qty 9

## 2018-09-25 MED ORDER — MELATONIN 3 MG TABLET
6.00 mg | ORAL_TABLET | Freq: Every evening | ORAL | Status: DC
Start: 2018-09-25 — End: 2018-09-29
  Administered 2018-09-25 – 2018-09-28 (×4): 6 mg via ORAL
  Filled 2018-09-25 (×6): qty 2

## 2018-09-25 MED ORDER — AMIODARONE 50 MG/ML INTRAVENOUS SOLUTION
0.50 mg/min | INTRAVENOUS | Status: DC
Start: 2018-09-26 — End: 2018-09-26
  Administered 2018-09-26 (×2): 0.5 mg/min via INTRAVENOUS
  Filled 2018-09-25: qty 9

## 2018-09-25 MED ORDER — MAGNESIUM OXIDE 400 MG (241.3 MG MAGNESIUM) TABLET
400.0000 mg | ORAL_TABLET | Freq: Every day | ORAL | Status: DC
Start: 2018-09-26 — End: 2018-09-29
  Administered 2018-09-26 – 2018-09-27 (×2): 400 mg via ORAL
  Administered 2018-09-28 – 2018-09-29 (×2): 0 mg via ORAL
  Filled 2018-09-25 (×5): qty 1

## 2018-09-25 MED ORDER — ATORVASTATIN 10 MG TABLET
10.0000 mg | ORAL_TABLET | Freq: Every day | ORAL | Status: DC
Start: 2018-09-26 — End: 2018-09-29
  Administered 2018-09-26 – 2018-09-29 (×4): 10 mg via ORAL
  Filled 2018-09-25 (×5): qty 1

## 2018-09-25 MED ADMIN — sodium chloride 0.9 % (flush) injection syringe: INTRAVENOUS | @ 13:00:00

## 2018-09-25 MED ADMIN — POTASSIUM CHLORIDE 20 MEQ WITH LIDOCAINE 1% IN NS 100ML IVPB: ORAL | @ 22:00:00

## 2018-09-25 MED ADMIN — sodium chloride 0.9 % (flush) injection syringe: INTRAVENOUS | @ 19:00:00

## 2018-09-25 MED ADMIN — aspirin 81 mg chewable tablet: RESPIRATORY_TRACT | @ 22:00:00

## 2018-09-25 NOTE — Consults (Signed)
CARDIOLOGY CONSULTATION     NAME: Kelsey [name]    DOB; 1951/05/18     MRN: [id number]    DATE 09/25/2018    INDICATION FOR CONSULT:  Atrial fibrillation    ASSESSMENT:  1. Paroxysmal atrial fibrillation currently converted to sinus rhythm  2. 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 Non-small cell carcinoma the long  4. COPD  5. No significant history of structural heart disease  6. Hypertension   RECOMMENDATIONS:  1. Discontinue diltiazem withhold any beta-blockers  2. Well bolus her with amiodarone overnight and increase her to 200 mg b.i.d. At discharge  3. Check amiodarone level future reference    DISCUSSION:  Kelsey [name] IS A 68 y.o. female who presented to the hospital emergency room with shortness of breath and racing heart.  She also had some atypical chest pain.  She was found to be in atrial fibrillation with rapid ventricular response rate and heart rate running as high as 200.  She was placed on diltiazem drip but then was noted to have rates following into the 40s.  Driving on floor however she has returned to sinus rhythm.    The patient has a history of chronic atrial fibrillation.  She follows with Dr. Georgeanna Lea.  She was previously treated with flecainide until lab became ineffective.  She was recently changed amiodarone she says about a month ago and is currently taking 1 200 mg dose daily.  I did not notice in review of home medications that she was taking beta-blockers or calcium channel blockers.  She has been on apixaban.  She has stage III non-small-cell carcinoma lung is just finished chemotherapy.  She is now doing radiation therapy and thinks that that will be finished soon.  She had no surgery as far as I can tell.  There is no history of structural heart disease in August of last year she had an echocardiogram Dr. Georgeanna Lea showing mild diastolic dysfunction preserved ejection fraction and no valvular heart disease nuclear stress test at the same time showed no scarring or reversible  ischemia.  She has no history of chest pain stents or congestive heart failure.  There is a history of sleep apnea and course a past history of COPD.  She has also had a recent productive cough and possible upper respiratory tract infection.    PAST HISTORY:  Past Medical History:   Diagnosis Date   . A-fib (CMS HCC)    . Arthropathy, unspecified, site unspecified    . Asthma    . Cancer (CMS HCC)     cervical   . Cataract    . Colon cancer (CMS [place]) 07/06/2014   . COPD (chronic obstructive pulmonary disease) (CMS HCC)    . Fistula    . HTN (hypertension)    . Hyperlipidemia    . Obesity    . Sleep apnea    . Ventral hernia          Patient Active Problem List    Diagnosis Date Noted   . Atrial fibrillation (CMS HCC) 09/25/2018   . Atrial fibrillation with RVR (CMS HCC) 08/29/2018   . Asthma 08/09/2018   . Neutropenic fever (CMS HCC) 08/07/2018   . Malignant neoplasm of lung, unspecified laterality, unspecified part of lung (CMS [place]) 06/26/2018   . Right lower lobe lung mass 03/22/2018   . Colon cancer (CMS [place]) 07/06/2014   . HTN (hypertension)    . Hyperlipidemia    . Obesity    .  Arthropathy, unspecified, site unspecified    . COPD (chronic obstructive pulmonary disease) (CMS HCC)    . Ventral hernia      Past Surgical History:   Procedure Laterality Date   . ABCESS DRAINAGE     . BOWEL RESECTION     . CATARACT EXTRACTION     . HX CERVICAL CONE BIOPSY      . HX CESAREAN SECTION     . HX GASTRIC BYPASS      25 years ago   . [place] (x2)   . HX HYSTERECTOMY      late 1990s   . HX LAP BANDING      2013   . HX LAPAROTOMY      2013 to remove lap band           MEDICATIONS  SEE INPATIENT MED LIST    ALLERGIES  Allergies   Allergen Reactions   . Shellfish Containing Products Shortness of Breath, Rash and Itching     scallops   . Vancomycin Shortness of Breath   . Barium Iodide    . Iodine And Iodide Containing Products      Itching, shortness of breath        FAMILY HISTORY  Family Medical  History:     Problem Relation (Age of Onset)    Diabetes Mother, Maternal Grandmother, Maternal Grandfather, Father    Heart Attack Mother    Stroke Father              SOCIAL HISTORY  Social History     Socioeconomic History   . Marital status: Married     Spouse name: Not on file   . Number of children: Not on file   . Years of education: Not on file   . Highest education level: Not on file   Tobacco Use   . Smoking status: Former Research scientist (life sciences)   . Smokeless tobacco: Never Used   . Tobacco comment: quit 10 yrs ago   Substance and Sexual Activity   . Alcohol use: Not Currently     Comment: rarely   . Drug use: No   Other Topics Concern   . Ability to Walk 1 Flight of Steps without SOB/CP No   . Ability To Do Own ADL's Yes       REVIEW OF SYSTEMS  REVIEW OF TEN SYSTEMS NEGATIVE EXCEPT AS OTHERWISE LISTED shortness of breath palpitations flutters atypical chest fullness    PHYSICAL EXAM  BP (!) 109/96   Pulse 84   Temp 37 C (98.6 F)   Resp 17   Ht 1.575 m (5' 2" )   Wt 127.7 kg (281 lb 8.4 oz)   SpO2 98%   BMI 51.49 kg/m   Very pleasant significantly obese elderly female who currently is in no respiratory distress.  HEENT exam no evidence of trauma no scleral icterus  Neck vein distension mild-to-moderate with about 3 cm JVD at 30 carotids are decreased without bruits thyroid normal heart sounds are currently regular S4 without an S3 no rub noted lungs sounds are clear somewhat distant diminished breath sounds.  Few scattered rhonchi and no rales.  Abdomen is morbidly obese an adequate exam is difficult no focal findings or tenderness noted.  Bowel sounds positive.  Femoral and distal pulses are also difficult palpate due to leg thickness are 1+ bilateral leg edema noted.  No obvious rashes are calf area tenderness  Neurologic leash  use well oriented moves all 4 extremities has no gross focal motor deficits.      Results for orders placed or performed during the hospital encounter of 09/25/18 (from the past 24  hour(s))   CBC/DIFF    Narrative    The following orders were created for panel order CBC/DIFF.  Procedure                               Abnormality         Status                     ---------                               -----------         ------                     CBC WITH IRJJ[[id number]]                Abnormal            Final result                 Please view results for these tests on the individual orders.   BASIC METABOLIC PANEL   Result Value Ref Range    SODIUM 139 135 - 145 mmol/L    POTASSIUM 4.2 3.5 - 5.0 mmol/L    CHLORIDE 107 98 - 111 mmol/L    CO2 TOTAL 24 21 - 35 mmol/L    ANION GAP 8 mmol/L    CALCIUM 8.7 (L) 8.8 - 10.3 mg/dL    GLUCOSE 153 (H) 70 - 110 mg/dL    BUN 14 10 - 25 mg/dL    CREATININE 0.75 <=1.30 mg/dL    BUN/CREA RATIO 19     ESTIMATED GFR >60 Avg: 85 mL/min/1.45m2    Narrative    Estimated Glomerular Filtration Rate (eGFR) calculated using the CKD-EPI (2009) equation, intended for patients 150years of age and older. If race and/or gender is not documented or "unknown," there will be no eGFR calculation.   TROPONIN-I   Result Value Ref Range    TROPONIN I 0.01 <=0.04 ng/mL   PT/INR   Result Value Ref Range    INR 1.22 (H) 0.80 - 1.10    Narrative    Coumadin Therapy INR range for Conventional Anticoagulation Therapy is 2.0 to 3.0 and for Intensive Anticoagulation Therapy 2.5 to 3.5   INFLUENZA VIRUS TYPE A AND TYPE B, AND RESPIRATORY SYNCYTIAL VIRUS (RSV), PCR   Result Value Ref Range    INFLUENZA VIRUS TYPE A Negative Negative    INFLUENZA VIRUS TYPE B Negative Negative    RESPIRATORY SYNCYTIAL VIRUS (RSV) Negative Negative   CBC WITH DIFF   Result Value Ref Range    WBC 3.0 (L) 3.5 - 10.3 x10^3/uL    RBC 4.28 3.80 - 5.24 x10^6/uL    HGB 11.8 11.8 - 15.8 g/dL    HCT 36.3 34.6 - 46.2 %    MCV 84.8 82.3 - 96.7 fL    MCH 27.7 27.6 - 33.2 pg    MCHC 32.7 32.6 - 35.4 g/dL    RDW 23.0 (H) 12.4 - 15.2 %    PLATELETS 171 140 - 440 x10^3/uL    MPV 7.8 6.6 - 10.2 fL  NEUTROPHIL % 55 %       LYMPHOCYTE % 18 %    MONOCYTE % 25 %    EOSINOPHIL % 0 %    BASOPHIL % 1 %    NEUTROPHIL # 1.70 1.50 - 6.40 x10^3/uL    LYMPHOCYTE # 0.60 (L) 0.90 - 3.40 x10^3/uL    MONOCYTE # 0.80 0.20 - 0.90 x10^3/uL    EOSINOPHIL # 0.00 0.00 - 0.50 x10^3/uL    BASOPHIL # 0.00 0.00 - 0.20 x10^3/uL   LACTIC ACID W/ REFLEX FOR >2.0   Result Value Ref Range    LACTIC ACID 2.1 (HH) <2.0 mmol/L   PROCALCITONIN, SERUM   Result Value Ref Range    PROCALCITONIN SERUM 0.10 <0.50 ng/mL   MAGNESIUM   Result Value Ref Range    MAGNESIUM 1.7 (L) 1.8 - 2.3 mg/dL   THYROID STIMULATING HORMONE (SENSITIVE TSH)   Result Value Ref Range    TSH 2.869 0.450 - 5.330 uIU/mL   Results for orders placed or performed during the hospital encounter of 09/25/18 (from the past 24 hour(s))   COMPREHENSIVE METABOLIC PANEL, NON-FASTING   Result Value Ref Range    SODIUM 138 135 - 145 mmol/L    POTASSIUM 4.0 3.5 - 5.0 mmol/L    CHLORIDE 107 98 - 111 mmol/L    CO2 TOTAL 26 21 - 35 mmol/L    ANION GAP 5 mmol/L    BUN 14 10 - 25 mg/dL    CREATININE 0.74 <=1.30 mg/dL    BUN/CREA RATIO 19     ESTIMATED GFR >60 Avg: 85 mL/min/1.42m2    ALBUMIN 3.8 3.2 - 4.6 g/dL    CALCIUM 8.8 8.8 - 10.3 mg/dL    GLUCOSE 159 (H) 70 - 110 mg/dL    ALKALINE PHOSPHATASE 78 20 - 130 U/L    ALT (SGPT) 29 <=52 U/L    AST (SGOT) 35 <=35 U/L    BILIRUBIN TOTAL 0.8 0.3 - 1.2 mg/dL    PROTEIN TOTAL 6.1 6.0 - 8.3 g/dL    Narrative    Estimated Glomerular Filtration Rate (eGFR) calculated using the CKD-EPI (2009) equation, intended for patients 180years of age and older. If race and/or gender is not documented or "unknown," there will be no eGFR calculation.   CBC/DIFF    Narrative    The following orders were created for panel order CBC/DIFF.  Procedure                               Abnormality         Status                     ---------                               -----------         ------                     CBC WITH DDHRC[[id number]]               Abnormal            Final result                  Please view results for these tests on the individual orders.   CBC WITH DIFF   Result  Value Ref Range    WBC 2.3 (L) 3.5 - 10.3 x10^3/uL    RBC 4.11 3.80 - 5.24 x10^6/uL    HGB 11.4 (L) 11.8 - 15.8 g/dL    HCT 34.8 34.6 - 46.2 %    MCV 84.7 82.3 - 96.7 fL    MCH 27.8 27.6 - 33.2 pg    MCHC 32.9 32.6 - 35.4 g/dL    RDW 23.0 (H) 12.4 - 15.2 %    PLATELETS 152 140 - 440 x10^3/uL    MPV 7.6 6.6 - 10.2 fL    NEUTROPHIL % 56 %    LYMPHOCYTE % 18 %    MONOCYTE % 24 %    EOSINOPHIL % 1 %    BASOPHIL % 1 %    NEUTROPHIL # 1.30 (L) 1.50 - 6.40 x10^3/uL    LYMPHOCYTE # 0.40 (L) 0.90 - 3.40 x10^3/uL    MONOCYTE # 0.60 0.20 - 0.90 x10^3/uL    EOSINOPHIL # 0.00 0.00 - 0.50 x10^3/uL    BASOPHIL # 0.00 0.00 - 0.20 x10^3/uL       EKG:  On admission noted a heart rate of 177 irregularly irregular rate and rhythm with nonspecific ST and T-wave changes.  Subsequent EKG shows sinus rhythm with lateral T-wave inversions.    SUMMARY COMMENTS:  Based on her fairly large body size 200 mg of amiodarone may not adequate.  I will send for serum level can be followed up as an outpatient.  Will stop her diltiazem because of the brady-tachy and give her intravenous loading dose and drip.  If she is stable she could probably discharged home tomorrow taking 200 mg b.i.d. And follow up with Dr. Reece Levy in his office thank you very much the consult

## 2018-09-25 NOTE — ED Nurses Note (Signed)
Nursing report called to [facility], RN. Questions answered. Informed patient is in [place] getting Echo and will be transported by transport team

## 2018-09-25 NOTE — ED Attending Note (Signed)
I was physically present and directly supervised this patient's care seen on 09/25/2018.  Patient was seen and examined by me. The resident, Estill Batten' history and exam were reviewed.  Key elements in addition to and/or correction of that documentation are as follows:    Chief Complaint   Patient presents with   . Fever 9 Weeks To 74 Years     hx of chemo and radiation  sent by radiation for eval  hx of lung cancer       History of Present Illness  Kelsey [name] is a 68 y.o. female accompanied by her husband presents with shortness of breath starting several days ago.  The patient was to receive radiation today (last radiation treatment was yesterday) when she was advised to come to the emergency department for her symptoms.  Associated complaints include chest pain, cold sweats, fever (100.5), weakness, fatigue, cough, and sore throat.  She has "felt yucky" for several days now. She describes her chest pain as a dull pressure sensation that radiates to her back.     The patient has history of lung cancer and atrial fibrillation for which she's taking Eliquis and Carvedilol (she has not missed any doses of anti-coagulation). She received her flu vaccination this season. Patient notes she had an xray performed yesterday for "low white blood cell count."    Past Medical History:  Past Medical History:   Diagnosis Date   . A-fib (CMS HCC)    . Arthropathy, unspecified, site unspecified    . Asthma    . Cancer (CMS HCC)     cervical   . Cataract    . Colon cancer (CMS [place]) 07/06/2014   . COPD (chronic obstructive pulmonary disease) (CMS HCC)    . Fistula    . HTN (hypertension)    . Hyperlipidemia    . Obesity    . Sleep apnea    . Ventral hernia      Past Surgical History:  Past Surgical History:   Procedure Laterality Date   . Abcess drainage     . Bowel resection     . Cataract extraction     . Hx cervical cone biopsy      . Hx cesarean section     . Hx gastric bypass     . Hx hernia repair     . Hx hysterectomy      . Hx lap banding     . Hx laparotomy       Social History:  Social History     Tobacco Use   . Smoking status: Former Research scientist (life sciences)   . Smokeless tobacco: Never Used   . Tobacco comment: quit 10 yrs ago   Substance Use Topics   . Alcohol use: Not Currently     Comment: rarely   . Drug use: No     Social History     Substance and Sexual Activity   Drug Use No     Family History:  Family History   Problem Relation Age of Onset   . Diabetes Mother    . Heart Attack Mother    . Diabetes Maternal Grandmother    . Diabetes Maternal Grandfather    . Diabetes Father    . Stroke Father      Medications Prior to Admission     Prescriptions    acetaminophen (TYLENOL) 325 mg Oral Tablet    Take 2 Tabs (650 mg total) by mouth Every 6 hours as needed  Patient not taking:  Reported on 08/25/2018    albuterol sulfate (PROVENTIL) 2.5 mg /3 mL (0.083 %) Inhalation Solution for Nebulization    3 mL (2.5 mg total) by Nebulization route Every 6 hours    amiodarone (PACERONE) 200 mg Oral Tablet    Take 1 Tab (200 mg total) by mouth Once a day    apixaban (ELIQUIS) 5 mg Oral Tablet    Take 1 Tab (5 mg total) by mouth Twice daily    atorvastatin (LIPITOR) 10 mg Oral Tablet    Take 10 mg by mouth Once a day    budesonide-formoterol (SYMBICORT) 160-4.5 mcg/actuation Inhalation HFA Aerosol Inhaler    Take 2 Puffs by inhalation Twice daily    CARVEDILOL ORAL    Take 6.25 mg by mouth Twice daily 3 tablets    docusate sodium (COLACE) 100 mg Oral Capsule    Take 100 mg by mouth Three times a day as needed     DULoxetine (CYMBALTA DR) 30 mg Oral Capsule, Delayed Release(E.C.)    Take 30 mg by mouth Once a day    levoFLOXacin (LEVAQUIN) 500 mg Oral Tablet    Take 1 Tab (500 mg total) by mouth Once a day for 5 days    linaCLOtide (LINZESS) 72 mcg Oral Capsule    Take 72 mcg by mouth Every morning    loperamide (IMODIUM) 2 mg Oral Capsule    Take 2 mg by mouth Every 4 hours as needed    loratadine (CLARITIN) 10 mg Oral Tablet    Take 1 Tab (10 mg total)  by mouth Once a day    magnesium oxide (MAG-OX) 400 mg Oral Tablet    Take 400 mg by mouth Once a day    omeprazole (PRILOSEC) 20 mg Oral Capsule, Delayed Release(E.C.)    Take 40 mg by mouth Once a day     oxyCODONE (ROXICODONE) 5 mg Oral Tablet    Take 1 Tab (5 mg total) by mouth Every 8 hours as needed for Pain    potassium chloride (K-DUR) 20 mEq Oral Tab Sust.Rel. Particle/Crystal    Take 20 mEq by mouth Once a day    prochlorperazine (COMPAZINE) 10 mg Oral Tablet    Take 1 Tab (10 mg total) by mouth Four times a day as needed for nausea/vomiting    rOPINIRole (REQUIP) 0.5 mg Oral Tablet    Take 0.5 mg by mouth Every night        Above history reviewed.    Physical Examination   Nursing notes reviewed.  Vitals reviewed.  Blood pressure (!) 146/104, pulse (!) 146, temperature 36.6 C (97.8 F), resp. rate 17, height 1.575 m (5\' 2" ), weight 131.1 kg (289 lb), SpO2 99 %.  Constitutional: Patient appears in mild distress.  Alert and oriented  HENT:   Head-Normocephalic   Mouth/Throat-Mucous membranes are moist  Ears-TMs intact and clear without injection bilaterally  Eyes-Pupils equal, round, reactive to light.  Conjunctivae normal bilaterally  Neck-Supple.  Normal range of motion  Cardiovascular:   Tachycardic (140s-160s), irregular rhythm  Pulmonary/Chest: No respiratory distress.  Breath sounds equal bilaterally.  No wheezes, rales or rhonchi    Abdomen:  Abdomen soft.  No tenderness, rebound, guarding or masses present   Musculoskeletal: Normal range of motion, no tenderness or gross deformity  Ext: No unilateral leg swelling or calf pain  Skin: Warm and dry.  No rash, erythema, pallor, cyanosis or jaundice  Neurological:  Alert and oriented.  CN II-XII grossly intact  5/5 motor strength of the upper extremities  5/5 motor strength of lower extremities  No significant sensory deficits of the upper extremities  No significant sensory deficits of lower extremities  Psychiatric: Normal mood and affect.  Behavior  is normal.  Judgment and thought content normal    Laboratory Studies  Results for orders placed or performed during the hospital encounter of 09/25/18 (from the past 12 hour(s))   MAGNESIUM   Result Value Ref Range    MAGNESIUM 1.7 (L) 1.8 - 2.3 mg/dL   BASIC METABOLIC PANEL   Result Value Ref Range    SODIUM 139 135 - 145 mmol/L    POTASSIUM 4.2 3.5 - 5.0 mmol/L    CHLORIDE 107 98 - 111 mmol/L    CO2 TOTAL 24 21 - 35 mmol/L    ANION GAP 8 mmol/L    CALCIUM 8.7 (L) 8.8 - 10.3 mg/dL    GLUCOSE 153 (H) 70 - 110 mg/dL    BUN 14 10 - 25 mg/dL    CREATININE 0.75 <=1.30 mg/dL    BUN/CREA RATIO 19     ESTIMATED GFR >60 Avg: 85 mL/min/1.56m^2   TROPONIN-I   Result Value Ref Range    TROPONIN I 0.01 <=0.04 ng/mL   PT/INR   Result Value Ref Range    INR 1.22 (H) 0.80 - 1.10   CBC WITH DIFF   Result Value Ref Range    WBC 3.0 (L) 3.5 - 10.3 x10^3/uL    RBC 4.28 3.80 - 5.24 x10^6/uL    HGB 11.8 11.8 - 15.8 g/dL    HCT 36.3 34.6 - 46.2 %    MCV 84.8 82.3 - 96.7 fL    MCH 27.7 27.6 - 33.2 pg    MCHC 32.7 32.6 - 35.4 g/dL    RDW 23.0 (H) 12.4 - 15.2 %    PLATELETS 171 140 - 440 x10^3/uL    MPV 7.8 6.6 - 10.2 fL    NEUTROPHIL % 55 %    LYMPHOCYTE % 18 %    MONOCYTE % 25 %    EOSINOPHIL % 0 %    BASOPHIL % 1 %    NEUTROPHIL # 1.70 1.50 - 6.40 x10^3/uL    LYMPHOCYTE # 0.60 (L) 0.90 - 3.40 x10^3/uL    MONOCYTE # 0.80 0.20 - 0.90 x10^3/uL    EOSINOPHIL # 0.00 0.00 - 0.50 x10^3/uL    BASOPHIL # 0.00 0.00 - 0.20 x10^3/uL   INFLUENZA VIRUS TYPE A AND TYPE B, AND RESPIRATORY SYNCYTIAL VIRUS (RSV), PCR   Result Value Ref Range    INFLUENZA VIRUS TYPE A Negative Negative    INFLUENZA VIRUS TYPE B Negative Negative    RESPIRATORY SYNCYTIAL VIRUS (RSV) Negative Negative   LACTIC ACID W/ REFLEX FOR >2.0   Result Value Ref Range    LACTIC ACID 2.1 (HH) <2.0 mmol/L   PROCALCITONIN, SERUM   Result Value Ref Range    PROCALCITONIN SERUM 0.10 <0.50 ng/mL     Imaging Studies  XR AP MOBILE CHEST   Final Result by Edi, Radresults In (02/06 1241)   1.  Right lower lobe mass is redemonstrated.   2. No evidence of pneumonia or pulmonary edema.            Radiologist location ID: [id number]             ECG:  Please refer to trace master for reading, compared to ECG on 08/29/2018.  Atrial fibrillation  with rapid ventricular response, rate 177    Repeat ECG, sinus rhythm rate 82, ischemic changes V4 through V6    Atrial fibrillation with rapid ventricular response during emergency department telemetry monitoring.    Previous medical records reviewed.    All labs and imaging reports reviewed.    Medical intervention during emergency department stay:  Medications   dilTIAZem (CARDIZEM) 125 mg in NS 125 mL (tot vol) infusion (15 mg/hr Intravenous New Bag/New Syringe 09/25/18 1220)   dilTIAZem (CARDIZEM) 5 mg/mL injection (10 mg Intravenous Given 09/25/18 1128)   NS bolus infusion 1,000 mL (1,000 mL Intravenous New Bag/New Syringe 09/25/18 1155)   dilTIAZem (CARDIZEM) 5 mg/mL injection (10 mg Intravenous Given 09/25/18 1345)       Medical Decision Making/ED Course  Patient underwent-ECG, chest x-ray, blood work influenza/RSV testing  Patient received-diltiazem bolus 10 mg IV x2, Cardizem drip  Repeat examination-improved  Results reviewed.     Critical care time  Total critical care time spent in direct care of this patient presenting with  Atrial fibrillation with rapid ventricular response followed by long pauses and eventual conversion to sinus rhythm.  This was based on the patient's presenting complaint, history and physical examination.  Prevention of further life-threatening deterioration of the patient's condition included initial evaluation, stabilization, review of data including medical records, re-examination and close monitoring.  Discussion with admitting and consulting services to arrange definitive care, and exclusive of any procedures performed, was 70 minutes.  In addition, I reviewed Estill Batten' documentation and agree with the assessment and plan of care.          Please refer to Estill Batten' documentation for further details MDM/ED course.    The plan of care and all results were discussed with the patientand the patient's husband.  The patient and the patient's husband were given the opportunity to ask questions and all questions were addressed and answered.  The patient voiced understanding and comfort with plan of care.  The patient was capable of medical decision making.    Consults  On-call hospitalist-Dr. Lilia Pro  On call cardiologist-Dr. Theodoro Kos    Impressions   Encounter Diagnosis   Name Primary?   . Atrial fibrillation with rapid ventricular response (CMS HCC) Yes      Disposition  Admission    I am scribing for, and in the presence of, Dr. Leontine Locket Aleyah Balik for services provided on 09/25/2018.  [address] [place], [place]  09/25/2018, 12:02    I personally performed the services described in this documentation, as scribed in my presence, and it is both accurate and complete (otherwise changes/modifications have been made by myself to the scribe documentation during my review of charting).    [place], DO 09/25/2018, 13:53  Emergency Medicine Attending    This chart may have been completed after the conclusion of this patient's care due to the time constraints of simultaneous responsiltibites of direct patient care activities during the clinical shift in the emergency department.   This note was partially generated using MModal Fluency Direct system, and there may be some incorrect words, spellings, and punctuation that were not noted in checking the note before saving.

## 2018-09-25 NOTE — ED Provider Notes (Signed)
Advanced Practice Provider: Estill Batten  Supervising Attending: Dr. Coralee North  Chief Complaint:  Chief Complaint   Patient presents with   . Fever 9 Weeks To 74 Years     hx of chemo and radiation  sent by radiation for eval  hx of lung cancer     History of Present Illness:  Kelsey [name] is a 68 y.o. female with a PMH of COPD, HTN, hyperlipidemia, sleep apnea who presents via POV with a chief complaint of chest pain and SOB which started this morning. The patient was sent to the ED from the oncology office for evaluation of her complaints. She has a hx of lung cancer and his undergoing radiation, and her last treatment was yesterday. The patient also reports a fever of 100.77F and cough. She denies palpitations. The patient is compliant with Eliquis for her hx of A-fib. She denies a hx of DVT/PE. Patient denies any other complaints or concerns at this time.     History Limitations: None    Review of Systems:  Constitutional: No chills or weakness +Fever  Skin: No rashes or diaphoresis  HENT: No congestion or rhinorrhea  Eyes: No vision changes, discharge  Cardio: No palpitations or leg swelling +Chest pain  Respiratory: No wheezing +SOB +Cough  GI:  No nausea, vomiting, diarrhea, constipation or abdominal pain  GU:  No dysuria, hematuria, polyuria  MSK: No joint or back pain  Neuro: No loss of sensation, headache, focal deficits or LOC      Allergies:  Allergies   Allergen Reactions   . Shellfish Containing Products Shortness of Breath, Rash and Itching     scallops   . Vancomycin Shortness of Breath   . Barium Iodide    . Iodine And Iodide Containing Products      Itching, shortness of breath      Past Medical History:  Past Medical History:   Diagnosis Date   . A-fib (CMS HCC)    . Arthropathy, unspecified, site unspecified    . Asthma    . Cancer (CMS HCC)     cervical   . Cataract    . Colon cancer (CMS [place]) 07/06/2014   . COPD (chronic obstructive pulmonary disease) (CMS HCC)    . Fistula    . HTN  (hypertension)    . Hyperlipidemia    . Obesity    . Sleep apnea    . Ventral hernia          Past Surgical History:  Past Surgical History:   Procedure Laterality Date   . ABCESS DRAINAGE     . BOWEL RESECTION     . CATARACT EXTRACTION     . HX CERVICAL CONE BIOPSY      . HX CESAREAN SECTION     . HX GASTRIC BYPASS      25 years ago   . [place] (x2)   . HX HYSTERECTOMY      late 1990s   . HX LAP BANDING      2013   . HX LAPAROTOMY      2013 to remove lap band         Social History:  Social History     Tobacco Use   . Smoking status: Former Research scientist (life sciences)   . Smokeless tobacco: Never Used   . Tobacco comment: quit 10 yrs ago   Substance Use Topics   . Alcohol use: Not Currently  Comment: rarely     Family History:  Family Medical History:     Problem Relation (Age of Onset)    Diabetes Mother, Maternal Grandmother, Maternal Grandfather, Father    Heart Attack Mother    Stroke Father            Physical Exam:  All nurse's notes reviewed.  Filed Vitals:    09/25/18 1230 09/25/18 1245 09/25/18 1330 09/25/18 1345   BP: 112/77 118/86 118/72 (!) 146/104   Pulse: (!) 168 (!) 112 (!) 151 (!) 146   Resp: (!) _0 Temp:       SpO2: 99% 99% 96%       Constitutional: No acute distress.  Alert and Oriented x3.  HENT:   Head: Normocephalic, Atraumatic  Mouth/Throat: Oropharynx is clear and moist.  Eyes:Conjunctivae without discharge.  Neck: Trachea midline.   Cardiovascular: Tachycardia, irregularly irregular rhythm  Pulmonary/Chest: Breath sounds equal bilaterally, good air movement. Mild respiratory distress with exertion. No wheezes, rales or chest tenderness.  Abdominal: Normal Bowel Sounds. Abdomen soft, no tenderness, rebound or guarding.  Musculoskeletal: No obvious deformity, swelling. 2+ peripheral pulses  Skin: Warm and dry. No rash, erythema, pallor or cyanosis.  Psychiatric: Behavior is normal. Mood and affect congruent.  Neurological: Alert & Oriented x3. Grossly intact. Moves all  extremities spontaneously.    Orders, Abnormal Labs and Imaging Results:  Results up to the Time the Disposition was Entered   BASIC METABOLIC PANEL - Abnormal; Notable for the following components:       Result Value    CALCIUM 8.7 (*)     GLUCOSE 153 (*)     All other components within normal limits    Narrative:     Estimated Glomerular Filtration Rate (eGFR) calculated using the CKD-EPI (2009) equation, intended for patients 85 years of age and older. If race and/or gender is not documented or "unknown," there will be no eGFR calculation.   PT/INR - Abnormal; Notable for the following components:    INR 1.22 (*)     All other components within normal limits    Narrative:     Coumadin Therapy INR range for Conventional Anticoagulation Therapy is 2.0 to 3.0 and for Intensive Anticoagulation Therapy 2.5 to 3.5   CBC WITH DIFF - Abnormal; Notable for the following components:    WBC 3.0 (*)     RDW 23.0 (*)     LYMPHOCYTE # 0.60 (*)     All other components within normal limits   LACTIC ACID LEVEL W/ REFLEX FOR LEVEL >2.0 - Abnormal; Notable for the following components:    LACTIC ACID 2.1 (*)     All other components within normal limits   MAGNESIUM - Abnormal; Notable for the following components:    MAGNESIUM 1.7 (*)     All other components within normal limits   TROPONIN-I - Normal   INFLUENZA VIRUS TYPE A AND TYPE B, AND RESPIRATORY SYNCYTIAL VIRUS (RSV), PCR - Normal   PROCALCITONIN, SERUM - Normal   ECG 12 LEAD - ED USE    Narrative:     -------------------------ECG Interpretation-----------------------------------  Atrial fibrillation with rapid V-rate  Ventricular premature complex  ST depression, probably rate related  .  .  .  MD Signature: Unconfirmed Diagnosis   CBC/DIFF    Narrative:     The following orders were created for panel order CBC/DIFF.  Procedure  Abnormality         Status                     ---------                               -----------         ------                      CBC WITH NXGZ[[id number]]                Abnormal            Final result                 Please view results for these tests on the individual orders.   XR AP MOBILE CHEST    Narrative:     Gwendolin Rauf    PROCEDURE DESCRIPTION: XR AP MOBILE CHEST    PROCEDURE PERFORMED DATE AND TIME: 09/25/2018 12:36 PM    CLINICAL INDICATION: chest pain, sob    TECHNIQUE: 1 views / 1 images submitted.    Comparison chest x-ray dated September 24, 2018.    A right infrahilar mass is redemonstrated. Heart size is normal. There is  no evidence of pneumonia or pulmonary edema. No pneumothorax is detected. A  right chest wall port remains in place.     ADULT ROUTINE BLOOD CULTURE, SET OF 2 BOTTLES (BACTERIA AND YEAST)   ADULT ROUTINE BLOOD CULTURE, SET OF 2 BOTTLES (BACTERIA AND YEAST)   PULSE OXIMETRY   INSERT & MAINTAIN PERIPHERAL IV ACCESS   CONTINUOUS CARDIAC MONITORING (ED USE ONLY)   LACTIC ACID TIMED   dilTIAZem (CARDIZEM) 125 mg in NS 125 mL (tot vol) infusion (15 mg/hr Intravenous New Bag/New Syringe 09/25/18 1220)   dilTIAZem (CARDIZEM) 5 mg/mL injection (10 mg Intravenous Given 09/25/18 1128)   NS bolus infusion 1,000 mL (1,000 mL Intravenous New Bag/New Syringe 09/25/18 1155)   dilTIAZem (CARDIZEM) 5 mg/mL injection (10 mg Intravenous Given 09/25/18 1345)       EKG: Reviewed by attending.  MDM:       Presents with chest pain, shortness of breath   Pt was tachycardic (113) upon arrival.   Pt received: Cardizem, NS Bolus Infusion 1000 mL   Imaging: Reviewed as above.   Labs: Reviewed as above.   Results discussed with pt.    Plan: Admission   Pt is agreeable with plan.    Pt was given the opportunity to ask questions and denies any questions/concerns at this time.     Consults: 1350 Spoke with Dr. Theodoro Kos who states he already spoke to Dr. Lilia Pro, and will consult when admitted.  1400 Spoke with Dr. Lilia Pro, hospitalist, who agreed to admit the patient.  Impression:   Encounter Diagnoses   Name Primary?   .  Atrial fibrillation with rapid ventricular response (CMS HCC) Yes   . Atrial fibrillation (CMS [facility])      Disposition:  Admitted  Admission: Patient will be admitted to Dr. Maxie Barb service for further evaluation and management.  The patient's family verbalized understanding of all instructions and had no further questions or concerns.  Follow Up:   No follow-up provider specified.  Prescriptions:      Current Discharge Medication List      CONTINUE these medications - NO CHANGES were made during your visit.  Details   acetaminophen 325 mg Tablet  Commonly known as:  TYLENOL   650 mg, Oral, EVERY 6 HOURS PRN  Refills:  0     albuterol sulfate 2.5 mg /3 mL (0.083 %) Solution for Nebulization  Commonly known as:  PROVENTIL   2.5 mg, Nebulization, EVERY 6 HOURS  Qty:  30 Each  Refills:  3     amiodarone 200 mg Tablet  Commonly known as:  PACERONE   200 mg, Oral, DAILY  Qty:  30 Tab  Refills:  5     apixaban 5 mg Tablet  Commonly known as:  ELIQUIS   5 mg, Oral, 2 TIMES DAILY  Refills:  0     atorvastatin 10 mg Tablet  Commonly known as:  LIPITOR   10 mg, Oral, DAILY  Refills:  0     budesonide-formoteroL 160-4.5 mcg/actuation HFA Aerosol Inhaler  Commonly known as:  SYMBICORT   2 Puffs, Inhalation, 2 TIMES DAILY  Refills:  0     CARVEDILOL ORAL   6.25 mg, Oral, 2 TIMES DAILY, 3 tablets  Refills:  0     docusate sodium 100 mg Capsule  Commonly known as:  COLACE   100 mg, Oral, 3 TIMES DAILY PRN  Refills:  0     DULoxetine 30 mg Capsule, Delayed Release(E.C.)  Commonly known as:  CYMBALTA DR   30 mg, Oral, DAILY  Refills:  0     levoFLOXacin 500 mg Tablet  Commonly known as:  LEVAQUIN   500 mg, Oral, DAILY  Qty:  5 Tab  Refills:  0     Linzess 72 mcg Capsule  Generic drug:  linaCLOtide   72 mcg, Oral, EVERY MORNING  Refills:  0     loperamide 2 mg Capsule  Commonly known as:  IMODIUM   2 mg, Oral, EVERY 4 HOURS PRN  Refills:  0     loratadine 10 mg Tablet  Commonly known as:  CLARITIN   10 mg, Oral, DAILY  Qty:  90  Tab  Refills:  0     magnesium oxide 400 mg Tablet  Commonly known as:  MAG-OX   400 mg, Oral, DAILY  Refills:  0     omeprazole 20 mg Capsule, Delayed Release(E.C.)  Commonly known as:  PRILOSEC   40 mg, Oral, DAILY  Refills:  0     oxyCODONE 5 mg Tablet  Commonly known as:  ROXICODONE   5 mg, Oral, EVERY 8 HOURS PRN  Qty:  60 Tab  Refills:  0     potassium chloride 20 mEq Tab Sust.Rel. Particle/Crystal  Commonly known as:  K-DUR   20 mEq, Oral, DAILY  Refills:  0     prochlorperazine 10 mg Tablet  Commonly known as:  Compazine   10 mg, Oral, 4 TIMES DAILY PRN  Qty:  60 Tab  Refills:  5     rOPINIRole 0.5 mg Tablet  Commonly known as:  REQUIP   0.5 mg, Oral, NIGHTLY  Refills:  0               Future Appointments   Date Time Provider [place]   09/25/2018  4:00 PM [place] [facility] [place] Image H   09/26/2018  9:45 AM TREATMENT MACHINE EKL [place] Image H   09/29/2018  9:45 AM TREATMENT MACHINE EKL [place] Image H   09/30/2018  9:45 AM TREATMENT MACHINE EKL [place] Image H  09/30/2018 10:00 AM Johnanna Schneiders, MD [place] Image H   10/01/2018  9:45 AM TREATMENT MACHINE EKL [place] Image H   10/02/2018  9:45 AM TREATMENT MACHINE EKL [place] Image H   10/30/2018 12:15 PM [place] [facility]/PET [place] Image H   10/31/2018 10:00 AM Vanetta Mulders, MD [facility]   11/21/2018  8:15 AM Riley Churches, MD [facility] None         I am scribing for, and in the presence of, Estill Batten for services provided on 09/25/2018.  Marcha Dutton, SCRIBE   Marcha Dutton, SCRIBE  09/25/2018, 12:37    A shared visit was performed with the co-signing physician.       I personally performed the services described in this documentation, as scribed  in my presence, and it is both accurate  and complete.    Estill Batten, APRN,FNP-BC

## 2018-09-25 NOTE — Nurses Notes (Signed)
Pt in room 1 w/ c/o cold sweats and chest pain. Pt verbalized pain rated at 6/10 to mid-chest that radiates to back between shoulder blades, and radiates down left arm. Pt denies relief at rest. Pt verbalized her temp was 100.5 degrees F this a.m at home.  Current vitals as follows.   BP 147/74,  P 69,  O2sat 98% on room air,  Temp 36.4 degrees C.  Pt verbalized labs were drawn this morning and she started a new antibiotic yesterday. Jimmye Norman, RN      Pt seen and assessed by Dr. Daryll Drown.  Pt to be transported to ER per Dr. Daryll Drown.  Pt escorted via wheelchair to ER by Writer and accompanied by pt's  husband. Jimmye Norman, RN

## 2018-09-25 NOTE — Care Plan (Signed)
Problem: Arrhythmia/Dysrhythmia  Goal: Normalized Cardiac Rhythm  Outcome: Ongoing (see interventions/notes)     Pt is alert and oriented. Pt does report some shortness of breath with exertion. Pt tolerating cardiac drip well and appears to be running in sinus rhythm. No falls thus far this shift. Fall precautions maintained. Pt will continue to work towards discharge goals. Cathie Beams, RN  09/25/2018, 17:52

## 2018-09-25 NOTE — H&P (Signed)
[facility]  Hospitalist  History and Physical    Kelsey [name], Kelsey [name]  Date of Admission:  09/25/2018  Date of Birth:  Dec 21, 1950  PCP: [place]:  Atrial fibrillation with RVR    HPI:     Ms. Kwasny is a 68 year old female with a prior history of stage III non-small cell lung cancer, who follows with Dr. Deatra Canter, and last had chemotherapy about 2 weeks ago which completed 6 cycles of treatment and is currently on radiation.  She has history of atrial fibrillation, is anticoagulated with Eliquis, which she last took yesterday evening.  Prior history of COPD that does not require oxygen.  Morbid obesity, with BMI of 50.  Sleep apnea, asthma who presented to the Emergency Department today with the sensation of chest congestion with some pain going through her back and palpitations.  She had reported experiencing a fever of 100.5 degrees Fahrenheit this morning, after awaking up feeling sweaty.  In the Emergency Department, she was found to be in atrial fibrillation with RVR, at one point, with a heart rate 201.  Her EKG showed atrial fibrillation with RVR, at 177.  She was started on a diltiazem drip, which was uptitrated to 15.  Currently, her heart rate is bouncing mostly from the low 100s up to occasionally as high as 130.  She is also occasionally dipping down into the 40s and occasionally high 30s while in the room, talking to her.  She states that she is now feeling completely back to her normal self and denies any chest discomfort or palpitations.  Her most recent echocardiogram revealed a normal ejection fraction.  She is currently on amiodarone.  States she used to be on flecainide, but was changed to amiodarone by her cardiologist, Dr. Reece Levy.  She also takes Coreg, reportedly for atrial fibrillation as well.  Regarding her fever, she is presently without white count being elevated.  She does have a subtle leukopenia, which may be related to her prior  chemotherapy from 2 weeks ago and is improved from yesterday.  She is nontoxic appearing.  Her procalcitonin was negative.  Her chest x-ray was without infiltrate and her urinalysis did not reveal any evidence of infection and her flu swab was negative.  A single blood culture from yesterday is no growth.  She has had some possible symptoms of some viral upper respiratory infection, including runny nose, however, she states that since having chemo she has noted her nose to be kind of runny and friable anyway.  She was given Levaquin yesterday in the oncology clinic for a 5-day course, however, she is on amiodarone, with which there might be considerable QTc prolongation affect.  The case was discussed with Dr. Theodoro Kos via phone.  She will be admitted and continued on diltiazem drip and anticoagulation.  May need to have consideration of a possible cardioversion.            ROS: Full review of systems performed negative with exception of pertinent negatives and positives mentioned in HPI.      Past Medical History:   Diagnosis Date   . A-fib (CMS HCC)    . Arthropathy, unspecified, site unspecified    . Asthma    . Cancer (CMS HCC)     cervical   . Cataract    . Colon cancer (CMS [place]) 07/06/2014   . COPD (chronic obstructive pulmonary disease) (CMS HCC)    .  Fistula    . HTN (hypertension)    . Hyperlipidemia    . Obesity    . Sleep apnea    . Ventral hernia     Stage III lung cancer adenocarcinoma          Past Surgical History:   Procedure Laterality Date   . ABCESS DRAINAGE     . BOWEL RESECTION     . CATARACT EXTRACTION     . HX CERVICAL CONE BIOPSY      . HX CESAREAN SECTION     . HX GASTRIC BYPASS      25 years ago   . [facility] (x2)   . HX HYSTERECTOMY      late 1990s   . HX LAP BANDING      2013   . HX LAPAROTOMY      2013 to remove lap band             Prior to Admission medications    Medication Sig Start Date End Date Taking? Authorizing Provider   acetaminophen (TYLENOL) 325 mg Oral  Tablet Take 2 Tabs (650 mg total) by mouth Every 6 hours as needed  Patient not taking: Reported on 08/25/2018 08/09/18   Cleaster Corin L, DO   albuterol sulfate (PROVENTIL) 2.5 mg /3 mL (0.083 %) Inhalation Solution for Nebulization 3 mL (2.5 mg total) by Nebulization route Every 6 hours 08/09/18   Kathrine Haddock, DO   amiodarone (PACERONE) 200 mg Oral Tablet Take 1 Tab (200 mg total) by mouth Once a day 09/11/18   Riley Churches, MD   apixaban Arne Cleveland) 5 mg Oral Tablet Take 1 Tab (5 mg total) by mouth Twice daily 03/27/18   Victory Dakin, MD   atorvastatin (LIPITOR) 10 mg Oral Tablet Take 10 mg by mouth Once a day    Provider, Historical   budesonide-formoterol (SYMBICORT) 160-4.5 mcg/actuation Inhalation HFA Aerosol Inhaler Take 2 Puffs by inhalation Twice daily    Provider, Historical   CARVEDILOL ORAL Take 6.25 mg by mouth Twice daily 3 tablets    Provider, Historical   docusate sodium (COLACE) 100 mg Oral Capsule Take 100 mg by mouth Three times a day as needed     Provider, Historical   DULoxetine (CYMBALTA DR) 30 mg Oral Capsule, Delayed Release(E.C.) Take 30 mg by mouth Once a day    Provider, Historical   levoFLOXacin (LEVAQUIN) 500 mg Oral Tablet Take 1 Tab (500 mg total) by mouth Once a day for 5 days 09/24/18 09/29/18  Herbie Drape, NP   linaCLOtide Rolan Lipa) 72 mcg Oral Capsule Take 72 mcg by mouth Every morning    Provider, Historical   loperamide (IMODIUM) 2 mg Oral Capsule Take 2 mg by mouth Every 4 hours as needed    Provider, Historical   loratadine (CLARITIN) 10 mg Oral Tablet Take 1 Tab (10 mg total) by mouth Once a day 09/10/18   Herbie Drape, NP   magnesium oxide (MAG-OX) 400 mg Oral Tablet Take 400 mg by mouth Once a day    Provider, Historical   omeprazole (PRILOSEC) 20 mg Oral Capsule, Delayed Release(E.C.) Take 40 mg by mouth Once a day     Provider, Historical   oxyCODONE (ROXICODONE) 5 mg Oral Tablet Take 1 Tab (5 mg total) by mouth Every 8 hours as needed for  Pain 09/18/18   Vanetta Mulders, MD   potassium chloride (K-DUR) 20 mEq Oral  Tab Sust.Rel. Particle/Crystal Take 20 mEq by mouth Once a day    Provider, Historical   prochlorperazine (COMPAZINE) 10 mg Oral Tablet Take 1 Tab (10 mg total) by mouth Four times a day as needed for nausea/vomiting 07/27/14   Homsi, Yaser, MD   rOPINIRole (REQUIP) 0.5 mg Oral Tablet Take 0.5 mg by mouth Every night    Provider, Historical         Allergies   Allergen Reactions   . Shellfish Containing Products Shortness of Breath, Rash and Itching     scallops   . Vancomycin Shortness of Breath   . Barium Iodide    . Iodine And Iodide Containing Products      Itching, shortness of breath          Social History     Tobacco Use   . Smoking status: Former Research scientist (life sciences)   . Smokeless tobacco: Never Used   . Tobacco comment: quit 10 yrs ago   Substance Use Topics   . Alcohol use: Not Currently     Comment: rarely       Family History:    Family Medical History:     Problem Relation (Age of Onset)    Diabetes Mother, Maternal Grandmother, Maternal Grandfather, Father    Heart Attack Mother    Stroke Father            PHYSICAL EXAM:  GENERAL:  Awake alert oriented x3, no acute distress  HEENT:  Sclerae clear, pupils equal  CARDIOVASCULAR:  Irregular, tachy, no murmur rub or gallop  ABDOMEN:  Soft, nontender, nondistended, obese  LUNGS:  Normal work of breathing, no crackles, no wheezes  NEURO:  Cranial 2-12 grossly intact, no focal deficit  EXTREMITIES:  No pretibial edema  SKIN:  No erythema or drainage surrounding port site    VITALS:  Temperature: 36.6 C (97.8 F)  Heart Rate: (!) 146  BP (Non-Invasive): (!) 146/104  Respiratory Rate: 17  SpO2: 96 %        Labs:    Lab Results Today:    Results for orders placed or performed during the hospital encounter of 09/25/18 (from the past 24 hour(s))   MAGNESIUM   Result Value Ref Range    MAGNESIUM 1.7 (L) 1.8 - 2.3 mg/dL   ECG 12 LEAD - ED USE   Result Value Ref Range    Heart Rate 177 BPM    PR Interval  Invalid ms    QRS Duration 84 ms    QT Interval 276 ms    QTC Calculation 474 ms    Calculated P Axis Invalid deg    QRS Axis 57 deg    Calculated T Axis 74 deg    I 40 Axis 36 deg    T 40 Axis 69 deg    ST Axis 216 deg    EKG Severity - ABNORMAL ECG -    BASIC METABOLIC PANEL   Result Value Ref Range    SODIUM 139 135 - 145 mmol/L    POTASSIUM 4.2 3.5 - 5.0 mmol/L    CHLORIDE 107 98 - 111 mmol/L    CO2 TOTAL 24 21 - 35 mmol/L    ANION GAP 8 mmol/L    CALCIUM 8.7 (L) 8.8 - 10.3 mg/dL    GLUCOSE 153 (H) 70 - 110 mg/dL    BUN 14 10 - 25 mg/dL    CREATININE 0.75 <=1.30 mg/dL    BUN/CREA RATIO 19  ESTIMATED GFR >60 Avg: 85 mL/min/1.45m^2   TROPONIN-I   Result Value Ref Range    TROPONIN I 0.01 <=0.04 ng/mL   PT/INR   Result Value Ref Range    INR 1.22 (H) 0.80 - 1.10   CBC WITH DIFF   Result Value Ref Range    WBC 3.0 (L) 3.5 - 10.3 x10^3/uL    RBC 4.28 3.80 - 5.24 x10^6/uL    HGB 11.8 11.8 - 15.8 g/dL    HCT 36.3 34.6 - 46.2 %    MCV 84.8 82.3 - 96.7 fL    MCH 27.7 27.6 - 33.2 pg    MCHC 32.7 32.6 - 35.4 g/dL    RDW 23.0 (H) 12.4 - 15.2 %    PLATELETS 171 140 - 440 x10^3/uL    MPV 7.8 6.6 - 10.2 fL    NEUTROPHIL % 55 %    LYMPHOCYTE % 18 %    MONOCYTE % 25 %    EOSINOPHIL % 0 %    BASOPHIL % 1 %    NEUTROPHIL # 1.70 1.50 - 6.40 x10^3/uL    LYMPHOCYTE # 0.60 (L) 0.90 - 3.40 x10^3/uL    MONOCYTE # 0.80 0.20 - 0.90 x10^3/uL    EOSINOPHIL # 0.00 0.00 - 0.50 x10^3/uL    BASOPHIL # 0.00 0.00 - 0.20 x10^3/uL   INFLUENZA VIRUS TYPE A AND TYPE B, AND RESPIRATORY SYNCYTIAL VIRUS (RSV), PCR   Result Value Ref Range    INFLUENZA VIRUS TYPE A Negative Negative    INFLUENZA VIRUS TYPE B Negative Negative    RESPIRATORY SYNCYTIAL VIRUS (RSV) Negative Negative   LACTIC ACID W/ REFLEX FOR >2.0   Result Value Ref Range    LACTIC ACID 2.1 (HH) <2.0 mmol/L   PROCALCITONIN, SERUM   Result Value Ref Range    PROCALCITONIN SERUM 0.10 <0.50 ng/mL   Results for orders placed or performed during the hospital encounter of 09/25/18 (from the  past 24 hour(s))   COMPREHENSIVE METABOLIC PANEL, NON-FASTING   Result Value Ref Range    SODIUM 138 135 - 145 mmol/L    POTASSIUM 4.0 3.5 - 5.0 mmol/L    CHLORIDE 107 98 - 111 mmol/L    CO2 TOTAL 26 21 - 35 mmol/L    ANION GAP 5 mmol/L    BUN 14 10 - 25 mg/dL    CREATININE 0.74 <=1.30 mg/dL    BUN/CREA RATIO 19     ESTIMATED GFR >60 Avg: 85 mL/min/1.22m^2    ALBUMIN 3.8 3.2 - 4.6 g/dL    CALCIUM 8.8 8.8 - 10.3 mg/dL    GLUCOSE 159 (H) 70 - 110 mg/dL    ALKALINE PHOSPHATASE 78 20 - 130 U/L    ALT (SGPT) 29 <=52 U/L    AST (SGOT) 35 <=35 U/L    BILIRUBIN TOTAL 0.8 0.3 - 1.2 mg/dL    PROTEIN TOTAL 6.1 6.0 - 8.3 g/dL   CBC WITH DIFF   Result Value Ref Range    WBC 2.3 (L) 3.5 - 10.3 x10^3/uL    RBC 4.11 3.80 - 5.24 x10^6/uL    HGB 11.4 (L) 11.8 - 15.8 g/dL    HCT 34.8 34.6 - 46.2 %    MCV 84.7 82.3 - 96.7 fL    MCH 27.8 27.6 - 33.2 pg    MCHC 32.9 32.6 - 35.4 g/dL    RDW 23.0 (H) 12.4 - 15.2 %    PLATELETS 152 140 - 440 x10^3/uL    MPV  7.6 6.6 - 10.2 fL    NEUTROPHIL % 56 %    LYMPHOCYTE % 18 %    MONOCYTE % 24 %    EOSINOPHIL % 1 %    BASOPHIL % 1 %    NEUTROPHIL # 1.30 (L) 1.50 - 6.40 x10^3/uL    LYMPHOCYTE # 0.40 (L) 0.90 - 3.40 x10^3/uL    MONOCYTE # 0.60 0.20 - 0.90 x10^3/uL    EOSINOPHIL # 0.00 0.00 - 0.50 x10^3/uL    BASOPHIL # 0.00 0.00 - 0.20 x10^3/uL       Imaging Studies:    XR AP MOBILE CHEST   Final Result   1. Right lower lobe mass is redemonstrated.   2. No evidence of pneumonia or pulmonary edema.            Radiologist location ID: [id number]             Assessment/Plan:   Reported chest congestion/discomfort this morning probably due to AFib with RVR  - cycle cardiac enzymes  - check A1c, TSH, lipid panel for risk factors    AFib with RVR/tachy-brady  - continue diltiazem drip  - continue Eliquis  - consult cardiology  - monitor on telemetry for significant pauses or bradycardia  - consider changed from carvedilol to metoprolol depending on if she develops significant bradycardia  - case discussed with  Dr. Theodoro Kos of Cardiology, may be candidate for cardioversion, otherwise if significant tachy-brady may need pacemaker  - cycle troponins  - check echo  - check TSH    Reported low-grade fever at home  - currently no evidence of infectious source patient is not significantly neutropenic  - procalcitonin is negative  - negative chest x-ray, negative blood culture from yesterday, negative urinalysis  - hold further antibiotics  - influenza negative  - could be other viral illness given rhinorrhea however patient refuses to have a nasal swab done again to check bio fire    Barely elevated lactic acid  - probably due to AFib with RVR and lack of perfusion from decrease filling time    Morbid obesity BMI 52    Hypertension    Hyperlipidemia  - continue Lipitor    COPD  - continue Symbicort    Sleep apnea    History of colon cancer    GERD  - PPI    Mood disorder  - duloxetine    Restless leg  - Requip    DVT prophylaxis:  On full anticoagulation with full-dose apixaban    Code status:  Confirmed with patient, she is full code.    Elana Alm, MD    Critical care time:  40 minutes    Addendum:  Upon arrival to the floor she converted to normal sinus rhythm and is in the 80s on diltiazem drip at 15.  Will await further consultation by Cardiology.

## 2018-09-26 ENCOUNTER — Ambulatory Visit (HOSPITAL_COMMUNITY): Payer: Medicare Other

## 2018-09-26 LAB — CBC WITH DIFF
BASOPHIL #: 0 x10ˆ3/uL (ref 0.00–0.20)
BASOPHIL %: 1 %
EOSINOPHIL #: 0 x10ˆ3/uL (ref 0.00–0.50)
EOSINOPHIL %: 1 %
HCT: 29.9 % — ABNORMAL LOW (ref 34.6–46.2)
HGB: 9.9 g/dL — ABNORMAL LOW (ref 11.8–15.8)
LYMPHOCYTE #: 0.5 x10ˆ3/uL — ABNORMAL LOW (ref 0.90–3.40)
LYMPHOCYTE %: 21 %
MCH: 28.3 pg (ref 27.6–33.2)
MCHC: 33.3 g/dL (ref 32.6–35.4)
MCV: 85 fL (ref 82.3–96.7)
MONOCYTE #: 0.7 x10ˆ3/uL (ref 0.20–0.90)
MONOCYTE %: 31 %
MPV: 8.2 fL (ref 6.6–10.2)
NEUTROPHIL #: 1.1 x10ˆ3/uL — ABNORMAL LOW (ref 1.50–6.40)
NEUTROPHIL %: 46 %
PLATELETS: 132 10*3/uL — ABNORMAL LOW (ref 140–440)
RBC: 3.52 x10ˆ6/uL — ABNORMAL LOW (ref 3.80–5.24)
RDW: 22.9 % — ABNORMAL HIGH (ref 12.4–15.2)
WBC: 2.3 x10ˆ3/uL — ABNORMAL LOW (ref 3.5–10.3)

## 2018-09-26 LAB — ECG 12 LEAD - ED USE
Calculated P Axis: INVALID deg
Calculated P Axis: INVALID deg
Calculated P Axis: -17 deg
Calculated T Axis: 74 deg
Calculated T Axis: 138 deg
EKG Severity: ABNORMAL
EKG Severity: ABNORMAL
EKG Severity: ABNORMAL
Heart Rate: 177 {beats}/min
Heart Rate: 82 {beats}/min
I 40 Axis: 36 deg
I 40 Axis: 36 deg
I 40 Axis: 42 deg
PR Interval: INVALID ms
PR Interval: 89 ms
QRS Axis: 57 deg
QRS Axis: 48 deg
QRS Duration: 84 ms
QRS Duration: 84 ms
QRS Duration: 88 ms
QT Interval: 276 ms
QT Interval: 368 ms
QTC Calculation: 474 ms
QTC Calculation: 430 ms
ST Axis: 216 deg
ST Axis: 142 deg
T 40 Axis: 69 deg
T 40 Axis: 68 deg

## 2018-09-26 LAB — ECG 12 LEAD - ADULT
Calculated P Axis: 73 deg
Calculated P Axis: INVALID deg
Calculated T Axis: 124 deg
Calculated T Axis: 134 deg
EKG Severity: ABNORMAL
EKG Severity: ABNORMAL
Heart Rate: 84 {beats}/min
Heart Rate: 161 {beats}/min
I 40 Axis: 44 deg
I 40 Axis: 30 deg
PR Interval: 158 ms
PR Interval: INVALID ms
QRS Axis: 54 deg
QRS Axis: 52 deg
QRS Duration: 101 ms
QRS Duration: 83 ms
QT Interval: 375 ms
QT Interval: 288 ms
QTC Calculation: 444 ms
QTC Calculation: 472 ms
ST Axis: 140 deg
ST Axis: 248 deg
T 40 Axis: 71 deg
T 40 Axis: INVALID deg

## 2018-09-26 LAB — LIPID PANEL
CHOLESTEROL: 119 mg/dL (ref 0–199)
HDL CHOL: 41 mg/dL (ref >40)
LDL DIRECT: 68 mg/dL (ref 0–99)
TRIGLYCERIDES: 106 mg/dL (ref 0–199)
VLDL CALC: 21 mg/dL (ref 0–50)

## 2018-09-26 LAB — HGA1C (HEMOGLOBIN A1C WITH EST AVG GLUCOSE)
ESTIMATED AVERAGE GLUCOSE: 123 mg/dL
ESTIMATED AVERAGE GLUCOSE: 123 mg/dL
HEMOGLOBIN A1C: 5.9 % — ABNORMAL HIGH (ref 4.1–5.7)
HEMOGLOBIN A1C: 5.9 % — ABNORMAL HIGH (ref 4.1–5.7)

## 2018-09-26 LAB — BASIC METABOLIC PANEL
ANION GAP: 7 mmol/L
BUN/CREA RATIO: 22
BUN: 15 mg/dL (ref 10–25)
CALCIUM: 8.6 mg/dL — ABNORMAL LOW (ref 8.8–10.3)
CALCIUM: 8.6 mg/dL — ABNORMAL LOW (ref 8.8–10.3)
CHLORIDE: 107 mmol/L (ref 98–111)
CO2 TOTAL: 25 mmol/L (ref 21–35)
CREATININE: 0.68 mg/dL (ref <=1.30)
ESTIMATED GFR: >60 mL/min/1.73mˆ2
GLUCOSE: 109 mg/dL (ref 70–110)
POTASSIUM: 3.6 mmol/L (ref 3.5–5.0)
SODIUM: 139 mmol/L (ref 135–145)

## 2018-09-26 LAB — PT/INR
INR: 1.22 — ABNORMAL HIGH (ref 0.80–1.10)
INR: 1.22 — ABNORMAL HIGH (ref 0.80–1.10)

## 2018-09-26 LAB — MAGNESIUM: MAGNESIUM: 1.7 mg/dL — ABNORMAL LOW (ref 1.8–2.3)

## 2018-09-26 LAB — PHOSPHORUS: PHOSPHORUS: 4.1 mg/dL (ref 2.5–4.5)

## 2018-09-26 MED ORDER — MAGNESIUM SULFATE 500 MG/ML (50 %) INJECTION SOLUTION
2.0000 g | Freq: Once | INTRAVENOUS | Status: AC
Start: 2018-09-26 — End: 2018-09-26
  Administered 2018-09-26: 2 g via INTRAVENOUS
  Administered 2018-09-26: 0 g via INTRAVENOUS
  Filled 2018-09-26: qty 4

## 2018-09-26 MED ORDER — DEXTROSE 5 % IN WATER (D5W) INTRAVENOUS SOLUTION
1.00 mg/min | INTRAVENOUS | Status: AC
Start: 2018-09-26 — End: 2018-09-27
  Administered 2018-09-26: 1 mg/min via INTRAVENOUS
  Filled 2018-09-26: qty 9

## 2018-09-26 MED ORDER — DEXTROSE 5 % IN WATER (D5W) INTRAVENOUS SOLUTION
0.50 mg/min | INTRAVENOUS | Status: DC
Start: 2018-09-27 — End: 2018-09-29
  Administered 2018-09-27 – 2018-09-28 (×9): 0.5 mg/min via INTRAVENOUS
  Administered 2018-09-29: 0 mg/min via INTRAVENOUS
  Filled 2018-09-26 (×4): qty 9

## 2018-09-26 MED ORDER — AMIODARONE 200 MG TABLET
200.00 mg | ORAL_TABLET | Freq: Two times a day (BID) | ORAL | Status: DC
Start: 2018-09-26 — End: 2018-09-26
  Filled 2018-09-26: qty 1

## 2018-09-26 MED ADMIN — electrolyte-A intravenous solution: ORAL | @ 06:00:00 | NDC 00338022104

## 2018-09-26 MED ADMIN — Medication: INTRAVENOUS | @ 12:00:00

## 2018-09-26 NOTE — Nurses Notes (Signed)
Pt. Noted to have converted back from NSR to Afib reaching 180 at times.  EKG obtained to confirm.  Spoke with Dr. Reece Levy.  Received orders to d/c oral amio which was to be started tonight and change to step 1 of IV amio.  Pt. Reports no chest pain at this time but says she does feel a "flutter".  Will continue to monitor. Dellie Catholic, RN  09/26/2018, 18:30

## 2018-09-26 NOTE — Progress Notes (Signed)
Hospitalist Progress Note    Kelsey [name]  Date of service: 09/26/2018  Date of Admission:  09/25/2018  Hospital Day:  LOS: 1 day     Subjective:  Patient feeling well this morning.  Converted to normal sinus rhythm last night.  Seen by cardiology and started on IV amiodarone drip, which continues.  Still with occasional bradycardia and short pauses less than 3 seconds on telemetry.  No further fever.    Vital Signs:  Temp (24hrs) Max:37 C (73.2 F)      Systolic (20URK), YHC:623 , Min:87 , JSE:831     Diastolic (51VOH), YWV:37, Min:54, Max:104    Temp  Avg: 36.8 C (98.3 F)  Min: 36.6 C (97.8 F)  Max: 37 C (98.6 F)  MAP (Non-Invasive)  Avg: 81 mmHG  Min: 69 mmHG  Max: 95 mmHG  Pulse  Avg: 117.4  Min: 72  Max: 190  Resp  Avg: 19.6  Min: 14  Max: 29  SpO2  Avg: 97 %  Min: 93 %  Max: 99 %  Pain Score (Numeric, Faces): 0    I/O:  I/O last 24 hours:      Intake/Output Summary (Last 24 hours) at 09/26/2018 0844  Last data filed at 09/25/2018 1800  Gross per 24 hour   Intake 240 ml   Output --   Net 240 ml     I/O current shift:  No intake/output data recorded.      acetaminophen (TYLENOL) tablet, 650 mg, Oral, Q6H PRN  amiodarone (CORDARONE) 450 mg in D5W 250 mL infusion, 0.5 mg/min, Intravenous, Continuous  apixaban (ELIQUIS) tablet, 5 mg, Oral, 2x/day  atorvastatin (LIPITOR) tablet, 10 mg, Oral, Daily  budesonide-formoterol (SYMBICORT) 160 mcg-4.5 mcg per inhalation oral inhaler, 2 Puff, Inhalation, 2x/day  DULoxetine (CYMBALTA) delayed release capsule, 30 mg, Oral, Daily  linaclotide (LINZESS) capsule, 72 mcg, Oral, QAM  loratadine (CLARITIN) tablet, 10 mg, Oral, Daily  magnesium oxide (MAG-OX) tablet, 400 mg, Oral, Daily  magnesium sulfate 2 G in D5W 100 mL IVPB, 2 g, Intravenous, Once  melatonin tablet, 6 mg, Oral, QPM  NS flush syringe, 3 mL, Intracatheter, Q8HRS  NS flush syringe, 3 mL, Intracatheter, Q1H PRN  oxyCODONE (ROXICODONE) immediate release tablet, 5 mg, Oral, Q8H PRN  pantoprazole (PROTONIX) delayed  release tablet, 20 mg, Oral, Daily before Breakfast  prochlorperazine (COMPAZINE) tablet, 10 mg, Oral, 4x/day PRN  rOPINIRole (REQUIP) tablet, 0.5 mg, Oral, NIGHTLY        Physical Exam:  GENERAL:  Awake alert oriented x3, no acute distress  CV:  Regular  HEENT:  Sclerae clear, pupils equal  ABD:  Soft  LUNGS:  Normal work of breathing  EXT:  No edema    Labs:    CBC  (Last 24 hours)    Date/Time WBC HGB HCT MCV PLATELETS    09/26/18 0614 2.3 (L)    9.9 (L)    29.9 (L)    85.0    132 (L)       09/25/18 1124 3.0 (L)    11.8    36.3    84.8    171       09/25/18 1006 2.3 (L)    11.4 (L)    34.8    84.7    152           BMP  (Last 24 hours)    Date/Time Na K Cl CO2 BUN CREAT Calcium Glucose    09/26/18 0614 139  3.6    107    25    15    0.68    8.6 (L)    109       09/25/18 1124 139    4.2    107    24    14    0.75    8.7 (L)    153 (H)       09/25/18 1006 138    4.0    107    26    14    0.74    8.8    159 (H)         Hepatic Function  (Last 24 hours)    Date/Time Albumin Total PTN Total Bili Direct Bili AST/SGOT ALT/SGPT Alk Phos    09/25/18 1006 3.8    6.1    0.8    -- 35    29    78           Recent Labs     09/26/18  0614   MAGNESIUM 1.7*   PHOSPHORUS 4.1     Recent Labs     09/26/18  0614   INR 1.22*     TROPONIN I   Date Value Ref Range Status   09/25/2018 0.01 <=0.04 ng/mL Final   09/25/2018 0.05 (HH) <=0.04 ng/mL Final   09/25/2018 0.01 <=0.04 ng/mL Final         Imaging:    Results for orders placed or performed during the hospital encounter of 09/25/18 (from the past 24 hour(s))   XR AP MOBILE CHEST     Status: None    Narrative    Kelsey [name]    PROCEDURE DESCRIPTION: XR AP MOBILE CHEST    PROCEDURE PERFORMED DATE AND TIME: 09/25/2018 12:36 PM    CLINICAL INDICATION: chest pain, sob    TECHNIQUE: 1 views / 1 images submitted.    Comparison chest x-ray dated September 24, 2018.    A right infrahilar mass is redemonstrated. Heart size is normal. There is  no evidence of pneumonia or pulmonary edema. No  pneumothorax is detected. A  right chest wall port remains in place.      Impression    1. Right lower lobe mass is redemonstrated.  2. No evidence of pneumonia or pulmonary edema.        Radiologist location ID: [id number]           Microbiology:  No results found for any visits on 09/25/18 (from the past 96 hour(s)).          Assessment/ Plan:   Reported chest congestion/discomfort this morning probably due to AFib with RVR  - cycle cardiac enzymes  - A1c 5.9, TSH 2.89, ldl  68    AFib with RVR/tachy-brady  - no beta-blocker for now  -  amiodarone drip per Cardiology  - continue Eliquis  -  troponins negative, barely elevated troponin secondary to AFib with RVR  -  echo with EF within normal limits, TSH normal  - cardiology consult follow-up    Reported low-grade fever at home  - currently no evidence of infectious source patient is not significantly neutropenic  - procalcitonin is negative  - negative chest x-ray, negative blood culture from yesterday, negative urinalysis  - hold further antibiotics  - influenza negative  - could be other viral illness given rhinorrhea however patient refuses to have a nasal swab done again to check bio fire  - continue to monitor cultures  -  remains afebrile and largely asymptomatic here  - she is not significantly neutropenic    Barely elevated lactic acid  - probably due to AFib with RVR and lack of perfusion from decrease filling time    Morbid obesity BMI 52    Hypertension    Hyperlipidemia  - continue Lipitor    COPD  - continue Symbicort    Sleep apnea  -  patient was tested 7 years ago and qualified for CPAP but could not afford the co-pay, her insurance is change now and I have counseled her to consider getting reassessed for CPAP given atrial fibrillation    History of colon cancer    GERD  - PPI    Mood disorder  - duloxetine    Restless leg  - Requip    DVT prophylaxis Fully anticoagulated with apixaban.    Disposition Planning:   Likely eventual home  discharge    Elana Alm, MD2/[id number]/[id number]  [place]

## 2018-09-26 NOTE — Care Plan (Signed)
Problem: Arrhythmia/Dysrhythmia  Goal: Normalized Cardiac Rhythm  Outcome: Ongoing (see interventions/notes)

## 2018-09-26 NOTE — Consults (Signed)
Kelsey [name], [place]  CONSULT FOLLOW UP      Kelsey [name], Kelsey [name]  Date of Birth:  02/27/1951  MRN:  [id number]  CSN:  [id number]    Date of Admission:  09/25/2018  Date of Service: 09/26/2018  LOS: [place] [facility] Day:  LOS: 1 day     Assessment:  1. Atrial fibrillation - on sinus rhythm  2. Tachy-brady syndrome  3. Back pain  4. Non-small-cell lung CA, currently under treatment  5. COPD  6. Hypertension     Recommendations:  Amiodarone level pending, okay to change dose to 200 b.i.d. PO  Continue Eliquis 5 mg b.i.d. For TEP  No bradycardia with holding Cardizem and beta-blocker  Follow-up with Dr. Reece Levy after discharge    Subjective:  Feels well this a.m.Kelsey [name]  Denies any issues with fatigue or weakness. No shortness of breath. Denies any chest pain although does have some back discomfort, which she tells me is chronic due to her radiation.  No significant pauses overnight.      Current Facility-Administered Medications:   .  acetaminophen (TYLENOL) tablet, 650 mg, Oral, Q6H PRN, Elana Alm, MD  .  amiodarone (CORDARONE) 450 mg in D5W 250 mL infusion, 0.5 mg/min, Intravenous, Continuous, Joette Catching, MD, Last Rate: 16.7 mL/hr at 09/26/18 0137, 0.5 mg/min at 09/26/18 0137  .  apixaban (ELIQUIS) tablet, 5 mg, Oral, 2x/day, Elana Alm, MD, 5 mg at 09/26/18 [phone]  .  atorvastatin (LIPITOR) tablet, 10 mg, Oral, Daily, Elana Alm, MD, 10 mg at 09/26/18 0842  .  budesonide-formoterol (SYMBICORT) 160 mcg-4.5 mcg per inhalation oral inhaler, 2 Puff, Inhalation, 2x/day, Elana Alm, MD, 2 Puff at 09/26/18 [phone]  .  DULoxetine (CYMBALTA) delayed release capsule, 30 mg, Oral, Daily, Elana Alm, MD, 30 mg at 09/26/18 0842  .  linaclotide (LINZESS) capsule, 72 mcg, Oral, QAM, Elana Alm, MD, 72 mcg at 09/26/18 0538  .  loratadine (CLARITIN) tablet, 10 mg, Oral, Daily, Elana Alm, MD, 10 mg at 09/26/18 0842  .  magnesium oxide (MAG-OX) tablet, 400 mg, Oral, Daily, Elana Alm, MD, 400 mg at  09/26/18 0842  .  magnesium sulfate 2 G in D5W 100 mL IVPB, 2 g, Intravenous, Once, Elana Alm, MD  .  melatonin tablet, 6 mg, Oral, QPM, Melany Guernsey, MD, 6 mg at 09/25/18 2149  .  NS flush syringe, 3 mL, Intracatheter, Q8HRS, Elana Alm, MD, Stopped at 09/25/18 1700  .  NS flush syringe, 3 mL, Intracatheter, Q1H PRN, Elana Alm, MD  .  oxyCODONE (ROXICODONE) immediate release tablet, 5 mg, Oral, Q8H PRN, Elana Alm, MD, 5 mg at 09/25/18 2153  .  pantoprazole (PROTONIX) delayed release tablet, 20 mg, Oral, Daily before Breakfast, Elana Alm, MD, 20 mg at 09/26/18 0538  .  prochlorperazine (COMPAZINE) tablet, 10 mg, Oral, 4x/day PRN, Elana Alm, MD  .  rOPINIRole (REQUIP) tablet, 0.5 mg, Oral, NIGHTLY, Elana Alm, MD, 0.5 mg at 09/25/18 2146    Objective:  BP (!) 147/90   Pulse 85   Temp 36.8 C (98.2 F)   Resp 20   Ht 1.575 m (5\' 2" )   Wt 127.7 kg (281 lb 8.4 oz)   SpO2 97%   BMI 51.49 kg/m   Telemetry:  Sinus rhythm  General:  Well developed, well nourished, in no distress.  Head:  No significant abnormalities  Cardiovascular:  Regular on exam with no appreciable murmurs  Lungs:  Clear to auscultation  Gastrointestinal::  Soft, nontender, + bowel tones  Lymphatic/Extremities:  No edema  Musculoskeletal: No significant deformities, MAE, strength equal  Skin: Warm and dry  Neuro: Alert and oriented, appropriate affect  Psychiatric: Normal mood and affect    Labs:  CBC:  Recent Labs     09/25/18  1006 09/25/18  1124 09/26/18  0614   WBC 2.3* 3.0* 2.3*   HGB 11.4* 11.8 9.9*   HCT 34.8 36.3 29.9*   PLTCNT 152 171 132*     Differential:   Recent Labs     09/25/18  1006 09/25/18  1124 09/26/18  0614   PMNS 56 55 46   LYMPHOCYTES 18 18 21    MONOCYTES 24 25 31    EOSINOPHIL 1 0 1   BASOPHILS 1  0.00 1  0.00 1  0.00   PMNABS 1.30* 1.70 1.10*   LYMPHSABS 0.40* 0.60* 0.50*   MONOSABS 0.60 0.80 0.70   EOSABS 0.00 0.00 0.00     RBC Indices:  Recent Labs     09/25/18  1006 09/25/18  1124  09/26/18  0614   MCV 84.7 84.8 85.0   MCH 27.8 27.7 28.3   MCHC 32.9 32.7 33.3   RDW 23.0* 23.0* 22.9*   MPV 7.6 7.8 8.2     BMP:  Recent Labs     09/25/18  1006 09/25/18  1124 09/26/18  0614   SODIUM 138 139 139   POTASSIUM 4.0 4.2 3.6   CHLORIDE 107 107 107   CO2 26 24 25    BUN 14 14 15    CREATININE 0.74 0.75 0.68   GLUCOSENF 159* 153* 109   ANIONGAP 5 8 7    GFR >60 >60 >60     Recent Labs     09/25/18  1006 09/25/18  1124 09/26/18  0614   CALCIUM 8.8 8.7* 8.6*   MAGNESIUM 1.7*  --  1.7*   PHOSPHORUS  --   --  4.1     Coagulation Studies:  Recent Labs     09/25/18  1124 09/26/18  0614   INR 1.22* 1.22*     Liver/Pancreas:  Recent Labs     09/24/18  0845 09/25/18  1006   TOTALPROTEIN 5.8* 6.1   ALBUMIN 3.5 3.8   AST 30 35   ALT 27 29   ALKPHOS 74 78     Cardiac Markers  Results for CANDE, MASTROPIETRO (MRN [id number]) as of 09/26/2018 10:29   Ref. Range 09/25/2018 11:24 09/25/2018 17:43 09/25/2018 22:08   TROPONIN-I Latest Ref Range: <=0.04 ng/mL 0.01 0.05 (HH) 0.01     TTE completed on 09/25/2018  Procedures:  Echocardiographic Report: Transthoracic complete echo, 2D, spectral and tissue Doppler, color flow Doppler, M-mode.  Indications:  Unspecified atrial fibrillation I48.91.  Conclusions:  1. Normal left ventricular size. LV Ejection Fraction is 50-55 %. Endocardium not well seen.  2. There is mild primary mitral valve regurgitation.  3. Estimated PA Pressure is 20-25 mmHg.  Findings:  Left Ventricle: Normal left ventricular size. LV Ejection Fraction is 50-55 %. Concentric remodeling.  Left Atrium: LA Dimension 2D = 2.5 cm.  Mitral Valve: There is mild primary mitral valve regurgitation.  Tricuspid Valve: There is mild tricuspid regurgitation.  Aorta: AoR Diam 2D = 2.2 cm. AoR Diam MM = 2.5 cm. LA/Ao 2D = 1.1.  Pulmonary Artery: Estimated PA Pressure is 20-25 mmHg.      Renea Ee, APRN     09/26/2018     10:29  Patient seen and examined by me on this visit. My findings are in agreement with those of the advanced  practice provider except unless documented below. Treatment assessment and changes directed by me.  Has no complaints this morning.  Rhythm is returned to sinus.  Vital signs appear stable.  I will change her to amiodarone 200 mg b.i.d. Which she can continue until she follows up with Dr. Georgeanna Lea.  Amiodarone level is pending but she may require 3-400 mg per day based body.  Continue apixaban.  Can be discharged home at any.    Joette Catching, MD

## 2018-09-27 LAB — ECG 12 LEAD - ADULT
Calculated P Axis: 75 deg
Calculated P Axis: 75 deg
Calculated T Axis: 127 deg
EKG Severity: ABNORMAL
Heart Rate: 82 {beats}/min
I 40 Axis: 48 deg
PR Interval: 159 ms
PR Interval: 159 ms
QRS Axis: 55 deg
QRS Duration: 100 ms
QT Interval: 386 ms
QTC Calculation: 451 ms
ST Axis: 131 deg
T 40 Axis: 67 deg

## 2018-09-27 MED ADMIN — loratadine 10 mg tablet: ORAL | @ 09:00:00

## 2018-09-27 MED ADMIN — oxyCODONE 5 mg tablet: ORAL | @ 02:00:00

## 2018-09-27 NOTE — Care Plan (Signed)
Pt alert and oriented. Pt absent of falls. Pt had no complaints of pain or shortness of breath. Pt continues on amiodarone drip at step 2. Pt is in normal sinus rhythm on the monitor. BP monitored hourly. Pt continues to be monitored. Aneira Cavitt, RN  09/27/2018, 05:35    Problem: Arrhythmia/Dysrhythmia  Goal: Normalized Cardiac Rhythm  Outcome: Ongoing (see interventions/notes)

## 2018-09-27 NOTE — Care Plan (Addendum)
Remains free of falls at this time.  Ambulates within room independently.  Amiodarone continues to infuse at 0.5mg /min. Monitor reflecting NSR at this time. Dellie Catholic, RN  09/27/2018, 18:10      Problem: Arrhythmia/Dysrhythmia  Goal: Normalized Cardiac Rhythm  Outcome: Ongoing (see interventions/notes)     Problem: Fall Injury Risk  Goal: Absence of Fall and Fall-Related Injury  Outcome: Ongoing (see interventions/notes)

## 2018-09-27 NOTE — Consults (Signed)
[facility]  Cardiology   Progress Note      Kelsey [name], Kelsey [name]  Date of Admission:  09/25/2018  Date of service: 09/27/2018  Date of Birth:  1951-07-31  MRN:  [id number]  Hospital Day:  LOS: 2 days     Subjective:     Patient is feeling much better per she has several questions about her atrial fibrillation breath spoke to Dr. Lilia Pro.  Patient back in normal sinus rhythm on IV amiodarone.  Denies any chest pain, PND, orthopnea, leg swelling    Objective:     Vital Signs:  Filed Vitals:    09/27/18 0500 09/27/18 0600 09/27/18 0700 09/27/18 0800   BP: (!) 160/91 (!) 144/89 128/72 136/66   Pulse: 79 78 74 80   Resp:    18   Temp:    36.5 C (97.7 F)   SpO2:    97%       Physical Exam:    General: Patient not in any acute distress.   HEENT: No JVD, No carotid bruit b/l  Lungs: Clear to auscultation and percussion.  Cardiovascular: Heart sounds are regular, systolic murmur.  [place]  Extremities: No edema, good peripheral pulses.  Neurology: Alert, awake and oriented x3     Telemetry reviewed:  Normal rhythm 70 80    Labs:  Lab Results   Component Value Date    CHOLESTEROL 119 09/26/2018    HDLCHOL 41 09/26/2018    LDLCHOLDIR 68 09/26/2018    TRIG 106 09/26/2018     CBC Results   Recent Labs     09/25/18  1006 09/25/18  1124 09/26/18  0614   WBC 2.3* 3.0* 2.3*   HGB 11.4* 11.8 9.9*   HCT 34.8 36.3 29.9*   PLTCNT 152 171 132*      BMP Results   Recent Labs     09/25/18  1006 09/25/18  1124 09/26/18  0614   SODIUM 138 139 139   POTASSIUM 4.0 4.2 3.6   CHLORIDE 107 107 107   CO2 26 24 25    BUN 14 14 15    CREATININE 0.74 0.75 0.68   GFR >60 >60 >60   ANIONGAP 5 8 7      Recent Labs     09/25/18  1006 09/25/18  1124 09/26/18  0614   CALCIUM 8.8 8.7* 8.6*   ALBUMIN 3.8  --   --    MAGNESIUM 1.7*  --  1.7*      Coag Results   Recent Labs     09/25/18  1124 09/26/18  0614   INR 1.22* 1.22*      Cardiac Results      Recent Labs     09/25/18  1124 09/25/18  1743 09/25/18  2208   UHCEASTTROPI 0.01 0.05* 0.01        Imaging:  XR AP MOBILE CHEST   Final  Result   1. Right lower lobe mass is redemonstrated.   2. No evidence of pneumonia or pulmonary edema.            Radiologist location ID: [id number]             Current Medications:  acetaminophen (TYLENOL) tablet, 650 mg, Oral, Q6H PRN  amiodarone (CORDARONE) 450 mg in D5W 250 mL infusion, 0.5 mg/min, Intravenous, Continuous  apixaban (ELIQUIS) tablet, 5 mg, Oral, 2x/day  atorvastatin (LIPITOR) tablet, 10 mg, Oral, Daily  budesonide-formoterol (SYMBICORT) 160 mcg-4.5 mcg per inhalation oral inhaler, 2 Puff, Inhalation, 2x/day  DULoxetine (  CYMBALTA) delayed release capsule, 30 mg, Oral, Daily  linaclotide (LINZESS) capsule, 72 mcg, Oral, QAM  loratadine (CLARITIN) tablet, 10 mg, Oral, Daily  magnesium oxide (MAG-OX) tablet, 400 mg, Oral, Daily  melatonin tablet, 6 mg, Oral, QPM  NS flush syringe, 3 mL, Intracatheter, Q8HRS  NS flush syringe, 3 mL, Intracatheter, Q1H PRN  oxyCODONE (ROXICODONE) immediate release tablet, 5 mg, Oral, Q8H PRN  pantoprazole (PROTONIX) delayed release tablet, 20 mg, Oral, Daily before Breakfast  prochlorperazine (COMPAZINE) tablet, 10 mg, Oral, 4x/day PRN  rOPINIRole (REQUIP) tablet, 0.5 mg, Oral, NIGHTLY      Allergies   Allergen Reactions   . Shellfish Containing Products Shortness of Breath, Rash and Itching     scallops   . Vancomycin Shortness of Breath   . Barium Iodide    . 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  Spoke to patient for a long time I told if she has significant bradycardia she might need a pacemaker .  Patient has several questions I answered all of them  Plan stay on IV amiodarone for now S stay on Eliquis I told she may have to stay till Monday if he does not will see 1 week after discharge    Stage III lung carcinoma  Plan told the patient to follow  with oncology doctor    Kelsey Churches, MD

## 2018-09-27 NOTE — Progress Notes (Signed)
Hospitalist Progress Note    Kelsey [name]  Date of service: 09/27/2018  Date of Admission:  09/25/2018  Hospital Day:  LOS: 2 days     Subjective:  She went into AFib with RVR again at a rate of 170 yesterday evening.  Was still on amiodarone at the time.  Dose was bumped back to 1.0 from 0.5.  She converted back to sinus rhythm.  Seen by Dr. Reece Levy this a.m.  Will stay on IV amiodarone at least until tomorrow.  She is feeling fine this morning.  She was mildly symptomatic with AFib yesterday.    Vital Signs:  Temp (24hrs) Max:36.7 C (01.6 F)      Systolic (01UXN), ATF:573 , Min:95 , UKG:254     Diastolic (27CWC), BJS:28, Min:56, Max:100    Temp  Avg: 36.6 C (97.9 F)  Min: 36.5 C (97.7 F)  Max: 36.7 C (98.1 F)  MAP (Non-Invasive)  Avg: 98 mmHG  Min: 98 mmHG  Max: 98 mmHG  Pulse  Avg: 90.3  Min: 74  Max: 156  Resp  Avg: 17.6  Min: 16  Max: 18  SpO2  Avg: 97 %  Min: 96 %  Max: 99 %  Pain Score (Numeric, Faces): 0    I/O:  I/O last 24 hours:      Intake/Output Summary (Last 24 hours) at 09/27/2018 1049  Last data filed at 09/27/2018 0900  Gross per 24 hour   Intake 600 ml   Output --   Net 600 ml     I/O current shift:  02/08 0700 - 02/08 1859  In: 120 [P.O.:120]  Out: -       acetaminophen (TYLENOL) tablet, 650 mg, Oral, Q6H PRN  amiodarone (CORDARONE) 450 mg in D5W 250 mL infusion, 0.5 mg/min, Intravenous, Continuous  apixaban (ELIQUIS) tablet, 5 mg, Oral, 2x/day  atorvastatin (LIPITOR) tablet, 10 mg, Oral, Daily  budesonide-formoterol (SYMBICORT) 160 mcg-4.5 mcg per inhalation oral inhaler, 2 Puff, Inhalation, 2x/day  DULoxetine (CYMBALTA) delayed release capsule, 30 mg, Oral, Daily  linaclotide (LINZESS) capsule, 72 mcg, Oral, QAM  loratadine (CLARITIN) tablet, 10 mg, Oral, Daily  magnesium oxide (MAG-OX) tablet, 400 mg, Oral, Daily  melatonin tablet, 6 mg, Oral, QPM  NS flush syringe, 3 mL, Intracatheter, Q8HRS  NS flush syringe, 3 mL, Intracatheter, Q1H PRN  oxyCODONE (ROXICODONE) immediate release tablet, 5  mg, Oral, Q8H PRN  pantoprazole (PROTONIX) delayed release tablet, 20 mg, Oral, Daily before Breakfast  prochlorperazine (COMPAZINE) tablet, 10 mg, Oral, 4x/day PRN  rOPINIRole (REQUIP) tablet, 0.5 mg, Oral, NIGHTLY        Physical Exam:  GENERAL:  Awake alert oriented x3, no acute distress  CV:  Regular  HEENT:  Sclerae clear, pupils equal  ABD:  Soft  LUNGS:  Normal work of breathing  EXT:  No edema    Labs:        No results for input(s): MAGNESIUM, PHOSPHORUS in the last 24 hours.  No results for input(s): INR, APTT in the last 24 hours.  TROPONIN I   Date Value Ref Range Status   09/25/2018 0.01 <=0.04 ng/mL Final   09/25/2018 0.05 (HH) <=0.04 ng/mL Final   09/25/2018 0.01 <=0.04 ng/mL Final         Imaging:           Microbiology:  Hospital Encounter on 09/25/18 (from the past 96 hour(s))   ADULT ROUTINE BLOOD CULTURE, SET OF 2 BOTTLES (BACTERIA AND YEAST)    Collection Time:  09/25/18 11:51 AM   Culture Result Status    BLOOD CULTURE, ROUTINE No Growth 18-24 hrs. Preliminary   ADULT ROUTINE BLOOD CULTURE, SET OF 2 BOTTLES (BACTERIA AND YEAST)    Collection Time: 09/25/18 11:55 AM   Culture Result Status    BLOOD CULTURE, ROUTINE No Growth 18-24 hrs. Preliminary             Assessment/ Plan:   Reported chest congestion/discomfort this morning probably due to AFib with RVR  -cycle cardiac enzymes  -A1c 5.9, TSH 2.89, ldl  68    AFib with RVR/tachy-brady  - no beta-blocker for now given bradycardia  - amiodarone drip per Cardiology  -continue Eliquis  - troponins negative, barely elevated troponin secondary to AFib with RVR  - echo with EF within normal limits, TSH normal  - cardiology consult follow-up    Reported low-grade fever at home  -currently no evidence of infectious source patient is not significantly neutropenic  -procalcitonin is negative  -negative chest x-ray, negative blood culture from yesterday, negative urinalysis  -hold further antibiotics  -influenza negative  -could be other  viral illness given rhinorrhea however patient refuses to have a nasal swab done again to check bio fire  - continue to monitor cultures  - remains afebrile and largely asymptomatic here  - she is not significantly neutropenic    Barely elevated lactic acid  -probably due to AFib with RVR and lack of perfusion from decrease filling time    Morbid obesity BMI 52    Hypertension    Hyperlipidemia  -continue Lipitor    COPD  -continue Symbicort    Sleep apnea  -  patient was tested 7 years ago and qualified for CPAP but could not afford the co-pay, her insurance is change now and I have counseled her to consider getting reassessed for CPAP given atrial fibrillation    History of colon cancer    GERD  -PPI    Mood disorder  -duloxetine    Restless leg  -Requip    DVT prophylaxis Fully anticoagulated with apixaban.    Disposition Planning:   Likely eventual home discharge, when okay with cardiology.    Elana Alm, MD2/8/202010:49  [facility] - HOSPITALIST

## 2018-09-28 LAB — BASIC METABOLIC PANEL
ANION GAP: 8 mmol/L
BUN/CREA RATIO: 19
BUN: 13 mg/dL (ref 10–25)
CALCIUM: 9 mg/dL (ref 8.8–10.3)
CHLORIDE: 103 mmol/L (ref 98–111)
CO2 TOTAL: 27 mmol/L (ref 21–35)
CREATININE: 0.69 mg/dL (ref <=1.30)
ESTIMATED GFR: >60 mL/min/1.73mˆ2
GLUCOSE: 125 mg/dL — ABNORMAL HIGH (ref 70–110)
POTASSIUM: 4.2 mmol/L (ref 3.5–5.0)
SODIUM: 138 mmol/L (ref 135–145)

## 2018-09-28 LAB — CBC
HCT: 34 % — ABNORMAL LOW (ref 34.6–46.2)
HGB: 11.1 g/dL — ABNORMAL LOW (ref 11.8–15.8)
MCH: 28.2 pg (ref 27.6–33.2)
MCHC: 32.8 g/dL (ref 32.6–35.4)
MCV: 85.9 fL (ref 82.3–96.7)
MPV: 7.9 fL (ref 6.6–10.2)
PLATELETS: 156 x10ˆ3/uL (ref 140–440)
RBC: 3.95 x10ˆ6/uL (ref 3.80–5.24)
RDW: 23.4 % — ABNORMAL HIGH (ref 12.4–15.2)
WBC: 3.4 x10ˆ3/uL — ABNORMAL LOW (ref 3.5–10.3)

## 2018-09-28 LAB — PHOSPHORUS: PHOSPHORUS: 4.4 mg/dL (ref 2.5–4.5)

## 2018-09-28 LAB — MAGNESIUM: MAGNESIUM: 1.8 mg/dL (ref 1.8–2.3)

## 2018-09-28 MED ORDER — AMLODIPINE 5 MG TABLET
5.0000 mg | ORAL_TABLET | Freq: Every day | ORAL | Status: DC
Start: 2018-09-28 — End: 2018-09-29
  Administered 2018-09-28 – 2018-09-29 (×2): 5 mg via ORAL
  Filled 2018-09-28 (×3): qty 1

## 2018-09-28 MED ORDER — LISINOPRIL 5 MG TABLET
5.0000 mg | ORAL_TABLET | Freq: Every day | ORAL | Status: DC
Start: 2018-09-28 — End: 2018-09-28
  Filled 2018-09-28: qty 1

## 2018-09-28 MED ADMIN — lactated Ringers intravenous solution: @ 14:00:00

## 2018-09-28 NOTE — Care Plan (Signed)
Patient will continue to work toward discharge goals.  Fall and protective precautions maintained. Pt walks independently in room. Amiodarone drip continues at 16.7 mL/hr. Pt has been NSR on telemetry in 90s for most of the shift. No chest pain/discomfort or shortness of breath reported throughout the shift. Pt has been hypertensive; Hospitalist made aware. Vitals stable at this time. Patient is alert and oriented, resting in bed at this time. Recent vitals: BP (!) 149/74   Pulse 96   Temp 36.6 C (97.9 F)   Resp 20   Ht 1.575 m (5\' 2" )   Wt 126.4 kg (278 lb 9.6 oz)   SpO2 96%   BMI 50.96 kg/m . Will monitor.    Glenford Bayley, RN  09/28/2018, 17:16

## 2018-09-28 NOTE — Progress Notes (Signed)
IP PROGRESS NOTE      Kelsey [name], Kelsey [name]  Date of Admission:  09/25/2018  Date of Birth:  1951-04-13  Date of Service:  09/28/2018    Chief Complaint:  AFib with RVR  Subjective:  Palpitation    Patient's heart rate better controlled with amiodarone drip, Eliquis for secondary prophylaxis of atrial fibrillation.    Is Problem List:  Active Hospital Problems   (*Primary Problem)    Diagnosis   . Atrial fibrillation (CMS HCC)       Assessment/ Plan:     1-AFib with RVR/tachy-brady  -no beta-blocker for now given bradycardia  -amiodarone drip per Cardiology  -continue Eliquis  -troponins negative,barely elevated troponin secondary to AFib with RVR  -echo with EF within normal limits, TSH normal  -cardiology consult follow-up    2-Morbid obesity BMI 52    3-Hypertension  Monitor blood pressure, may add antihypertensive medication if warranted    4-Hyperlipidemia  -continue Lipitor    5-COPD  -continue Symbicort    6-Sleep apnea  -patient was tested 7 years ago and qualified for CPAP but could not afford the co-pay,her insurance is change now and I have counseled her to consider getting reassessed for CPAP given atrial fibrillation    7-History of colon cancer    8-Mood disorder  -duloxetine    9-Restless leg  -Requip    DVT prophylaxisFully anticoagulated with apixaban.    Disposition Planning:  Likely eventual home discharge, when okay with cardiology.    Vital Signs:  Temp (24hrs) Max:36.9 C (98.4 F)      Temperature: 36.9 C (98.4 F)  BP (Non-Invasive): (!) 149/79  MAP (Non-Invasive): 112 mmHG  Heart Rate: 85  Respiratory Rate: 20  Pain Score (Numeric, Faces): 7  SpO2: 97 %    Current Medications:  acetaminophen (TYLENOL) tablet, 650 mg, Oral, Q6H PRN  amiodarone (CORDARONE) 450 mg in D5W 250 mL infusion, 0.5 mg/min, Intravenous, Continuous  apixaban (ELIQUIS) tablet, 5 mg, Oral, 2x/day  atorvastatin (LIPITOR) tablet, 10 mg, Oral, Daily  budesonide-formoterol (SYMBICORT) 160 mcg-4.5 mcg  per inhalation oral inhaler, 2 Puff, Inhalation, 2x/day  DULoxetine (CYMBALTA) delayed release capsule, 30 mg, Oral, Daily  linaclotide (LINZESS) capsule, 72 mcg, Oral, QAM  loratadine (CLARITIN) tablet, 10 mg, Oral, Daily  magnesium oxide (MAG-OX) tablet, 400 mg, Oral, Daily  melatonin tablet, 6 mg, Oral, QPM  NS flush syringe, 3 mL, Intracatheter, Q8HRS  NS flush syringe, 3 mL, Intracatheter, Q1H PRN  oxyCODONE (ROXICODONE) immediate release tablet, 5 mg, Oral, Q8H PRN  pantoprazole (PROTONIX) delayed release tablet, 20 mg, Oral, Daily before Breakfast  prochlorperazine (COMPAZINE) tablet, 10 mg, Oral, 4x/day PRN  rOPINIRole (REQUIP) tablet, 0.5 mg, Oral, NIGHTLY        Today's Physical Exam:  General: appears chronically sick looking. No distress.   Eyes: Pupils equal and round, reactive to light and accomodation.   HENT:Head atraumatic and normocephalic   Neck: No JVD or thyromegaly or lymphadenopathy   Lungs: CTAB, non labored breathing, no rales or wheezing.    Cardiovascular: regular rate and rhythm, S1, S2 normal, no murmur,   Abdomen: Soft, non-tender, Bowel sounds normal, No hepatosplenomegaly   Extremities: extremities normal, atraumatic, no cyanosis or edema   Skin: Skin warm and dry   Neurologic: Grossly normal   Psychiatric: Normal affect, behavior,     I/O:  I/O last 24 hours:      Intake/Output Summary (Last 24 hours) at 09/28/2018 1408  Last data filed at 09/28/2018 0900  Gross per 24 hour   Intake 600 ml   Output --   Net 600 ml     I/O current shift:  02/09 0700 - 02/09 1859  In: 480 [P.O.:480]  Out: -       Labs  Please indicate ordered or reviewed)  Reviewed:   Lab Results for Last 24 Hours:    Results for orders placed or performed during the hospital encounter of 09/25/18 (from the past 24 hour(s))   CBC   Result Value Ref Range    WBC 3.4 (L) 3.5 - 10.3 x10^3/uL    RBC 3.95 3.80 - 5.24 x10^6/uL    HGB 11.1 (L) 11.8 - 15.8 g/dL    HCT 34.0 (L) 34.6 - 46.2 %    MCV 85.9 82.3 - 96.7 fL    MCH 28.2  27.6 - 33.2 pg    MCHC 32.8 32.6 - 35.4 g/dL    RDW 23.4 (H) 12.4 - 15.2 %    PLATELETS 156 140 - 440 x10^3/uL    MPV 7.9 6.6 - 10.2 fL   BASIC METABOLIC PANEL   Result Value Ref Range    SODIUM 138 135 - 145 mmol/L    POTASSIUM 4.2 3.5 - 5.0 mmol/L    CHLORIDE 103 98 - 111 mmol/L    CO2 TOTAL 27 21 - 35 mmol/L    ANION GAP 8 mmol/L    CALCIUM 9.0 8.8 - 10.3 mg/dL    GLUCOSE 125 (H) 70 - 110 mg/dL    BUN 13 10 - 25 mg/dL    CREATININE 0.69 <=1.30 mg/dL    BUN/CREA RATIO 19     ESTIMATED GFR >60 Avg: 85 mL/min/1.46m^2   MAGNESIUM   Result Value Ref Range    MAGNESIUM 1.8 1.8 - 2.3 mg/dL   PHOSPHORUS   Result Value Ref Range    PHOSPHORUS 4.4 2.5 - 4.5 mg/dL     Radiology Tests (Please indicate ordered or reviewed)  Reviewed:

## 2018-09-28 NOTE — Consults (Signed)
[facility]  Cardiology   Progress Note      Kelsey [name], Kelsey [name]  Date of Admission:  09/25/2018  Date of service: 09/28/2018  Date of Birth:  [birth year]  MRN:  [id number]  Hospital Day:  LOS: 3 days     Subjective:     Patient sitting up in a chair feeling better per patient had atrial fibrillation last night rate was rapid per patient back in normal sinus rhythm per denies any chest pain, short of breath, PND, orthopnea, leg swelling    Objective:     Vital Signs:  Filed Vitals:    09/28/18 0500 09/28/18 0600 09/28/18 0700 09/28/18 0751   BP: (!) 167/69 (!) 151/83 (!) 147/93 (!) 151/94   Pulse: 81 78 76 92   Resp:    20   Temp:    36.6 C (97.9 F)   SpO2:    98%       Physical Exam:    General: Patient not in any acute distress.   HEENT: No JVD, No carotid bruit b/l  Lungs: Clear to auscultation and percussion.  Cardiovascular: Heart sounds are regular, systolic murmur.  [place]  Extremities: No edema, good peripheral pulses.  Neurology: Alert, awake and oriented x3     Telemetry reviewed:  Normal rhythm 70-80    Labs:  Lab Results   Component Value Date    CHOLESTEROL 119 09/26/2018    HDLCHOL 41 09/26/2018    LDLCHOLDIR 68 09/26/2018    TRIG 106 09/26/2018     CBC Results   Recent Labs     09/25/18  1124 09/26/18  0614 09/28/18  0817   WBC 3.0* 2.3* 3.4*   HGB 11.8 9.9* 11.1*   HCT 36.3 29.9* 34.0*   PLTCNT 171 132* 156      BMP Results   Recent Labs     09/25/18  1006 09/25/18  1124 09/26/18  0614 09/28/18  0817   SODIUM 138 139 139 138   POTASSIUM 4.0 4.2 3.6 4.2   CHLORIDE 107 107 107 103   CO2 26 24 25 27    BUN 14 14 15 13    CREATININE 0.74 0.75 0.68 0.69   GFR >60 >60 >60 >60   ANIONGAP 5 8 7 8      Recent Labs     09/25/18  1006 09/25/18  1124 09/26/18  0614 09/28/18  0817   CALCIUM 8.8 8.7* 8.6* 9.0   ALBUMIN 3.8  --   --   --    MAGNESIUM 1.7*  --  1.7* 1.8      Coag Results   Recent Labs     09/25/18  1124 09/26/18  0614   INR 1.22* 1.22*      Cardiac Results      Recent Labs     09/25/18  1124 09/25/18  1743 09/25/18  2208      UHCEASTTROPI 0.01 0.05* 0.01        Imaging:  XR AP MOBILE CHEST   Final Result   1. Right lower lobe mass is redemonstrated.   2. No evidence of pneumonia or pulmonary edema.            Radiologist location ID: [id number]             Current Medications:  acetaminophen (TYLENOL) tablet, 650 mg, Oral, Q6H PRN  amiodarone (CORDARONE) 450 mg in D5W 250 mL infusion, 0.5 mg/min, Intravenous, Continuous  apixaban (ELIQUIS) tablet, 5 mg, Oral, 2x/day  atorvastatin (LIPITOR)  tablet, 10 mg, Oral, Daily  budesonide-formoterol (SYMBICORT) 160 mcg-4.5 mcg per inhalation oral inhaler, 2 Puff, Inhalation, 2x/day  DULoxetine (CYMBALTA) delayed release capsule, 30 mg, Oral, Daily  linaclotide (LINZESS) capsule, 72 mcg, Oral, QAM  loratadine (CLARITIN) tablet, 10 mg, Oral, Daily  magnesium oxide (MAG-OX) tablet, 400 mg, Oral, Daily  melatonin tablet, 6 mg, Oral, QPM  NS flush syringe, 3 mL, Intracatheter, Q8HRS  NS flush syringe, 3 mL, Intracatheter, Q1H PRN  oxyCODONE (ROXICODONE) immediate release tablet, 5 mg, Oral, Q8H PRN  pantoprazole (PROTONIX) delayed release tablet, 20 mg, Oral, Daily before Breakfast  prochlorperazine (COMPAZINE) tablet, 10 mg, Oral, 4x/day PRN  rOPINIRole (REQUIP) tablet, 0.5 mg, Oral, NIGHTLY      Allergies   Allergen Reactions   . Shellfish Containing Products Shortness of Breath, Rash and Itching     scallops   . Vancomycin Shortness of Breath   . Barium Iodide    . Iodine And Iodide Containing Products      Itching, shortness of breath        Assessment/ Plan:    Atrial fibrillation paroxysmal patient had AFib last night with rapid heart rate now she is back in normal sinus rhythm she is on IV amiodarone  Plan stay on IV amiodarone 80s till this evening or maybe tomorrow to make sure she is not going back and forth for stay on Eliquis    Riley Churches, MD

## 2018-09-28 NOTE — Care Plan (Signed)
Problem: Arrhythmia/Dysrhythmia  Goal: Normalized Cardiac Rhythm  Outcome: Ongoing (see interventions/notes)     Problem: Fall Injury Risk  Goal: Absence of Fall and Fall-Related Injury  Outcome: Ongoing (see interventions/notes)

## 2018-09-28 NOTE — Care Plan (Signed)
Pt alert and oriented. Pt absent of falls. Pt had no complaints of chest pain. Amiodarone drip infusing at step 2. Pt is currently in sinus rhythm. BP has been hight throughout the shift. BP (!) 135/96   Pulse 91   Temp 36.8 C (98.2 F)   Resp 18   Ht 1.575 m (5\' 2" )   Wt 127 kg (279 lb 14.4 oz)   SpO2 97%   BMI 51.19 kg/m   Pt continues to be monitored. Aisley Whan, RN  09/28/2018, 05:14    Problem: Arrhythmia/Dysrhythmia  Goal: Normalized Cardiac Rhythm  Outcome: Ongoing (see interventions/notes)     Problem: Fall Injury Risk  Goal: Absence of Fall and Fall-Related Injury  Outcome: Ongoing (see interventions/notes)

## 2018-09-28 NOTE — Nurses Notes (Addendum)
Pts heart rated noted to be in the 120s-160s around 0330 at rest. Pt HR is now back in the 90s. Will monitor. Dayron Odland, RN  09/28/2018, 04:15

## 2018-09-29 ENCOUNTER — Ambulatory Visit (HOSPITAL_COMMUNITY): Payer: Medicare Other

## 2018-09-29 LAB — BASIC METABOLIC PANEL
ANION GAP: 8 mmol/L
BUN/CREA RATIO: 18
BUN/CREA RATIO: 18
BUN: 13 mg/dL (ref 10–25)
CALCIUM: 8.9 mg/dL (ref 8.8–10.3)
CHLORIDE: 103 mmol/L (ref 98–111)
CO2 TOTAL: 27 mmol/L (ref 21–35)
CREATININE: 0.72 mg/dL (ref <=1.30)
ESTIMATED GFR: >60 mL/min/1.73mˆ2
GLUCOSE: 112 mg/dL — ABNORMAL HIGH (ref 70–110)
POTASSIUM: 4.1 mmol/L (ref 3.5–5.0)
SODIUM: 138 mmol/L (ref 135–145)

## 2018-09-29 LAB — CBC WITH DIFF
BASOPHIL #: 0 10*3/uL (ref 0.00–0.20)
BASOPHIL %: 1 %
EOSINOPHIL #: 0 x10ˆ3/uL (ref 0.00–0.50)
EOSINOPHIL %: 1 %
HCT: 32.7 % — ABNORMAL LOW (ref 34.6–46.2)
HGB: 10.6 g/dL — ABNORMAL LOW (ref 11.8–15.8)
LYMPHOCYTE #: 0.5 x10ˆ3/uL — ABNORMAL LOW (ref 0.90–3.40)
LYMPHOCYTE %: 14 %
MCH: 28.1 pg (ref 27.6–33.2)
MCHC: 32.4 g/dL — ABNORMAL LOW (ref 32.6–35.4)
MCV: 86.8 fL (ref 82.3–96.7)
MONOCYTE #: 0.7 10*3/uL (ref 0.20–0.90)
MONOCYTE %: 20 %
MPV: 7.9 fL (ref 6.6–10.2)
NEUTROPHIL #: 2.4 10*3/uL (ref 1.50–6.40)
NEUTROPHIL %: 65 %
PLATELETS: 143 10*3/uL (ref 140–440)
RBC: 3.76 x10ˆ6/uL — ABNORMAL LOW (ref 3.80–5.24)
RDW: 23.4 % — ABNORMAL HIGH (ref 12.4–15.2)
WBC: 3.7 10*3/uL (ref 3.5–10.3)

## 2018-09-29 LAB — ADULT ROUTINE BLOOD CULTURE, SET OF 2 BOTTLES (BACTERIA AND YEAST): BLOOD CULTURE, ROUTINE: NO GROWTH

## 2018-09-29 LAB — AMIODARONE, SERUM
AMIODARONE, SERUM: <0.2 ug/mL — ABNORMAL LOW
DESETHYLAMIODARONE: <0.2 ug/mL

## 2018-09-29 MED ORDER — AMLODIPINE 5 MG TABLET
5.0000 mg | ORAL_TABLET | Freq: Every day | ORAL | 1 refills | Status: DC
Start: 2018-09-30 — End: 2018-12-16

## 2018-09-29 MED ORDER — AMIODARONE 200 MG TABLET
200.00 mg | ORAL_TABLET | Freq: Two times a day (BID) | ORAL | 1 refills | Status: DC
Start: 2018-09-29 — End: 2018-12-16

## 2018-09-29 MED ORDER — AMIODARONE 200 MG TABLET
200.00 mg | ORAL_TABLET | Freq: Two times a day (BID) | ORAL | Status: DC
Start: 2018-09-29 — End: 2018-09-29
  Administered 2018-09-29: 200 mg via ORAL
  Filled 2018-09-29 (×4): qty 1

## 2018-09-29 MED ADMIN — magnesium oxide 400 mg (241.3 mg magnesium) tablet: ORAL | @ 09:00:00

## 2018-09-29 MED ADMIN — LORazepam 0.5 mg tablet: @ 06:00:00

## 2018-09-29 MED ADMIN — sodium chloride 0.9 % intravenous solution: @ 14:00:00 | NDC 00338004904

## 2018-09-29 NOTE — Nurses Notes (Signed)
Patient discharged home with family.  AVS reviewed with patient/care giver.  A written copy of the AVS and discharge instructions was given to the patient/care giver.  Questions sufficiently answered as needed.  Patient/care giver encouraged to follow up with PCP as indicated.  In the event of an emergency, patient/care giver instructed to call 911 or go to the nearest emergency room.     Port deaccessed and telemetry discontinued for discharge.     Andrey Farmer, RN  09/29/2018, 09:55

## 2018-09-29 NOTE — Discharge Summary (Signed)
[place]      PATIENT NAME:  Kelsey [name], Kelsey [name]  MRN:  [id number]  DOB:  January 26, 1951      ENCOUNTER START DATE: 09/25/2018  INPATIENT ADMISSION DATE: 09/25/2018    DISCHARGE DATE:  09/29/2018    ATTENDING PHYSICIAN: Osmin Welz,MD.  PRIMARY CARE PHYSICIAN: [facility]     ADMISSION DIAGNOSIS: <principal problem not specified>  Chief Complaint   Patient presents with   . Fever 9 Weeks To 74 Years     hx of chemo and radiation  sent by radiation for eval  hx of lung cancer       DISCHARGE DIAGNOSIS:   1- AFib rate controlled  2-history of colon cancer  3-morbid obesity  4-history of COPD and sleep apnea  5-history of hypertension and dyslipidemia  6-history of mood disorder    Hospital Problems) (* Primary Problem)    Diagnosis Date Noted   . Atrial fibrillation (CMS [facility]) 09/25/2018      Resolved Hospital Problems   No resolved problems to display.     Active Non-Hospital Problems    Diagnosis Date Noted   . Atrial fibrillation with RVR (CMS HCC) 08/29/2018   . Asthma 08/09/2018   . Neutropenic fever (CMS HCC) 08/07/2018   . Malignant neoplasm of lung, unspecified laterality, unspecified part of lung (CMS [place]) 06/26/2018   . Right lower lobe lung mass 03/22/2018   . Colon cancer (CMS [place]) 07/06/2014   . HTN (hypertension)    . Hyperlipidemia    . Obesity    . Arthropathy, unspecified, site unspecified    . COPD (chronic obstructive pulmonary disease) (CMS HCC)    . Ventral hernia         DISCHARGE MEDICATIONS:     Current Discharge Medication List      START taking these medications.      Details   amLODIPine 5 mg Tablet  Commonly known as:  NORVASC  Start taking on:  September 30, 2018   5 mg, Oral, DAILY  Qty:  30 Tab  Refills:  1        CONTINUE these medications which have CHANGED during your visit.      Details   amiodarone 200 mg Tablet  Commonly known as:  PACERONE  What changed:  when to take this   200 mg, Oral, 2 TIMES DAILY  Qty:  28 Tab  Refills:  1        CONTINUE  these medications - NO CHANGES were made during your visit.      Details   albuterol sulfate 2.5 mg /3 mL (0.083 %) Solution for Nebulization  Commonly known as:  PROVENTIL   2.5 mg, Nebulization, EVERY 6 HOURS  Qty:  30 Each  Refills:  3     apixaban 5 mg Tablet  Commonly known as:  ELIQUIS   5 mg, Oral, 2 TIMES DAILY  Refills:  0     atorvastatin 10 mg Tablet  Commonly known as:  LIPITOR   10 mg, Oral, DAILY  Refills:  0     budesonide-formoteroL 160-4.5 mcg/actuation HFA Aerosol Inhaler  Commonly known as:  SYMBICORT   2 Puffs, Inhalation, 2 TIMES DAILY  Refills:  0     docusate sodium 100 mg Capsule  Commonly known as:  COLACE   100 mg, Oral, 3 TIMES DAILY PRN  Refills:  0     DULoxetine 30 mg Capsule, Delayed Release(E.C.)  Commonly known  as:  CYMBALTA DR   30 mg, Oral, DAILY  Refills:  0     Linzess 72 mcg Capsule  Generic drug:  linaCLOtide   72 mcg, Oral, EVERY MORNING  Refills:  0     loperamide 2 mg Capsule  Commonly known as:  IMODIUM   2 mg, Oral, EVERY 4 HOURS PRN  Refills:  0     loratadine 10 mg Tablet  Commonly known as:  CLARITIN   10 mg, Oral, DAILY  Qty:  90 Tab  Refills:  0     magnesium oxide 400 mg Tablet  Commonly known as:  MAG-OX   400 mg, Oral, DAILY  Refills:  0     omeprazole 20 mg Capsule, Delayed Release(E.C.)  Commonly known as:  PRILOSEC   40 mg, Oral, DAILY  Refills:  0     oxyCODONE 5 mg Tablet  Commonly known as:  ROXICODONE   5 mg, Oral, EVERY 8 HOURS PRN  Qty:  60 Tab  Refills:  0     potassium chloride 20 mEq Tab Sust.Rel. Particle/Crystal  Commonly known as:  K-DUR   20 mEq, Oral, DAILY  Refills:  0     prochlorperazine 10 mg Tablet  Commonly known as:  Compazine   10 mg, Oral, 4 TIMES DAILY PRN  Qty:  60 Tab  Refills:  5     rOPINIRole 0.5 mg Tablet  Commonly known as:  REQUIP   0.5 mg, Oral, NIGHTLY  Refills:  0        STOP taking these medications.    CARVEDILOL ORAL     levoFLOXacin 500 mg Tablet  Commonly known as:  LEVAQUIN            DISCHARGE INSTRUCTIONS:         AMIODARONE, SERUM    Result to be followed At outpatient cardiology follow-up clinic.     DISCHARGE INSTRUCTION - CARDIAC DIET     Diet: CARDIAC DIET      DISCHARGE INSTRUCTION - ACTIVITY     Activity: AS TOLERATED      DISCHARGE INSTRUCTION - MISC    Increase amiodarone to 200 mg twice daily to control atrial fibrillation.  Continue taking Eliquis.  Stop taking Coreg now due to low heart rate.  Follow-up with your cardiologist Dr. Reece Levy.  Seek medical attention for return of heart racing, chest discomfort, or other concerning symptoms.  Follow-up as previously scheduled with your oncologist.  Please discuss being re-referred for sleep study with your primary care provider as uncontrolled sleep apnea can make atrial fibrillation very difficult to treat.  You do not appear to have evidence of infection you can stop taking Levaquin.     SCHEDULE FOLLOW-UP - CARDIOLOGY - [place]     Follow-up in: [place]    Reason for visit: HOSPITAL DISCHARGE    Follow-up reason: Atrial fibrillation    Provider: Dr. Rutherford Nail KEEP FOLLOW-UP APPT ALREADY SCHEDULED IN [facility] ONCOLOGY    Please keep your appointment with your follow-up provider. If for some reason you need to cancel, be sure to reschedule as this is important for your care.     [place] [[phone]]      Follow-up Information     Schedule an appointment as soon as possible for a visit with Center, [facility].    Specialty:  EXTERNAL  Why:  Hospital follow-up  Contact information:  [place]  [place] [address]  [phone]             [facility] .    Specialty:  CARDIOVASCULAR SERVICES  Contact information:  [address] [place] [address] [address]  [facility] at [facility]  .    Specialty:  Hematology and Oncology  Contact information:  [place]  [phone]  Additional information:   --  Parking is  located directly outside the [facility].    --  The [place] is located just past the [facility] Entrance.    --  Wheel chairs are available inside the entrance if needed.    --  To locate [facility], enter the building and proceed left through the doors into the hospital lobby. The [place] entrance is located on the right.     --  Enter the [place] and proceed to registration desk directly across from entrance.                       REASON FOR HOSPITALIZATION AND HOSPITAL COURSE:  This is a 68 y.o., female  With medical history significant for stage III non-small cell lung cancer,  history of atrial fibrillation, anticoagulated with Eliquis,  history of COPD that does not require oxygen, Morbid obesity, Sleep apnea, asthma who presented to the Emergency Department  with the sensation of chest congestion with some pain going through her back and palpitations.   Patient was found out to be in AFib with RVR, started with Cardizem drip and admitted to telemetry floor for further management.  Patient also takes metoprolol and continued, but started to have [facility] cardia, for which she was seen in consultation by cardiology Dr. Theodoro Kos, Cardizem and metoprolol discontinued and started with amiodarone drip.  Patient's heart rate better controlled with amiodarone drip .  Drip switched to amiodarone 200 b.i.d. p.o. For 2 weeks.  Patient heart rate better controlled with the above measures, of note blood pressure was not controlled at this admission and amlodipine was added, Toprol discontinued due to bradycardia.  Patient was given 2 weeks appointment to see Dr. Theodoro Kos  as outpatient.  Serum amiodarone level requested result to be followed in clinic.  Continued with Eliquis home dose.  Patient is medically stable now and will be discharged home with the above medication changes.    SIGNIFICANT PHYSICAL FINDINGS:  Alert and oriented x3, heart rate running between 90 and 96.  SIGNIFICANT LAB:      SIGNIFICANT RADIOLOGY:   CONSULTATIONS:  Cardiology  PROCEDURES PERFORMED:       [place]:  Please see above    DOES PATIENT HAVE ADVANCED DIRECTIVES:  Yes, Patient Does Have Advance Directive for [place] - POST form discussed for  30 minutes with patient and/or appropriate decision maker - not desired at this time    CONDITION ON DISCHARGE: Alert, Oriented and VS Stable    DISCHARGE DISPOSITION:  Home discharge     Copies sent to Care Team       Relationship Specialty Notifications [place], [place] PCP - General EXTERNAL  10/01/17     Phone: [phone] Fax: [phone] [place] [address]  Kelsey [name] Clint Guy, MD

## 2018-09-29 NOTE — Consults (Signed)
[place]  Cardiology Progress Note  Kelsey [name]  09/29/2018  [id number]    Subjective:   No new complaints today .  Has not been walking very much since being hospitalized.  No real shortness of    Objective:   Filed Vitals:    09/29/18 0400 09/29/18 0500 09/29/18 0600 09/29/18 0700   BP: (!) 153/77 (!) 158/98 131/90 (!) 150/76   Pulse: 84 80 94 85   Resp: 18      Temp: 36.5 C (97.7 F)      SpO2: 98%        No significant JVD  Lungs clear without rales, wheezes or consolidation mild dullness in both bases  Cor RRR/ S4 , no murmur/rub  Abdomen soft, non tender, no focal findings  Extrem edema 0  Neuro no focal findings, well oriented    CBC  (Last 24 hours)    Date/Time WBC HGB HCT MCV PLATELETS    09/29/18 0622 3.7    10.6 (L)    32.7 (L)    86.8    143       09/28/18 0817 3.4 (L)    11.1 (L)    34.0 (L)    85.9    156         WBC/Diff  (Last 24 hours)    Date/Time WBC Bands PMNs Lymphs Mono Eos Basos    09/29/18 0622 3.7    -- 65    14    20    1    1        09/28/18 0817 3.4 (L)    -- -- -- -- -- --          BMP  (Last 24 hours)    Date/Time Na K Cl CO2 BUN CREAT Calcium Glucose    09/29/18 0622 138    4.1    103    27    13    0.72    8.9    112 (H)       09/28/18 0817 138    4.2    103    27    13    0.69    9.0    125 (H)           Xr Chest Ap And Lateral    Result Date: 09/24/2018  Impression No acute process identified. Right lower lobe mass appears similar. Radiologist location ID: [id number]     Xr Ap Mobile Chest    Result Date: 09/25/2018  Impression 1. Right lower lobe mass is redemonstrated. 2. No evidence of pneumonia or pulmonary edema. Radiologist location ID: [id number]     EKG Unchanged from previous tracings        Assessment/Plan:   Patient Active Problem List   Diagnosis   . HTN (hypertension)   . Hyperlipidemia   . Obesity   . Arthropathy, unspecified, site unspecified   . COPD (chronic obstructive pulmonary disease) (CMS HCC)   . Ventral hernia   . Colon cancer (CMS HCC)   .  Right lower lobe lung mass   . Malignant neoplasm of lung, unspecified laterality, unspecified part of lung (CMS HCC)   . Neutropenic fever (CMS HCC)   . Asthma   . Atrial fibrillation with RVR (CMS HCC)   . Atrial fibrillation (CMS HCC)    1. Atrial fibrillation with rapid ventricular response rate.  Paroxysmal the currently fairly well controlled  2. Cancer of the  lung currently stable under 1 chemotherapy   3. Hypertension well controlled.    Recommendations:  1. Discontinue intravenous amiodarone proceed to p.o. Dosing of 200 mg b.i.d.  2. Will continue 200 b.i.d. For least 2 weeks   3. Will need review amiodarone serum levels when returned as an outpatient.  Dosage may need to be increased slightly to 3 of 400 mg daily  4. If she continues to experience recurrence of rapid rate reinstitution of beta-blockers may be necessary.  Neck a she may require permanent pacemaker based on significant pauses.  5. Continue apixaban as on admission    Patient could be discharged home cardiology standpoint.  Should follow up with myself or Dr. Reece Levy within about 2 weeks.          Joette Catching, MD  09/29/2018, 07:42

## 2018-09-29 NOTE — Care Plan (Signed)
Pt alert and oriented. Pt absent of falls. Pt received PRN pain medication for chronic pain. Amiodarone drip continued at step 2. BP monitored hourly. BP (!) 153/77   Pulse 84   Temp 36.5 C (97.7 F)   Resp 18   Ht 1.575 m (5\' 2" )   Wt 126.4 kg (278 lb 9.6 oz)   SpO2 98%   BMI 50.96 kg/m   Pt remains in normal sinus on telemetry. Pt continues to be monitored. Shany Marinez, RN  09/29/2018, 05:37     Problem: Arrhythmia/Dysrhythmia  Goal: Normalized Cardiac Rhythm  Outcome: Ongoing (see interventions/notes)     Problem: Fall Injury Risk  Goal: Absence of Fall and Fall-Related Injury  Outcome: Ongoing (see interventions/notes)

## 2018-09-30 ENCOUNTER — Inpatient Hospital Stay (HOSPITAL_COMMUNITY)
Admission: RE | Admit: 2018-09-30 | Discharge: 2018-09-30 | Disposition: A | Discharge status: Still In Hospital | Payer: Medicare Other | Source: Ambulatory Visit

## 2018-09-30 ENCOUNTER — Other Ambulatory Visit: Payer: Self-pay

## 2018-09-30 ENCOUNTER — Inpatient Hospital Stay (HOSPITAL_COMMUNITY)
Admission: RE | Admit: 2018-09-30 | Discharge: 2018-09-30 | Disposition: A | Discharge status: Still In Hospital | Payer: Medicare Other | Source: Ambulatory Visit | Admitting: Radiation Oncology

## 2018-09-30 LAB — ADULT ROUTINE BLOOD CULTURE, SET OF 2 BOTTLES (BACTERIA AND YEAST)
BLOOD CULTURE, ROUTINE: NO GROWTH
BLOOD CULTURE, ROUTINE: NO GROWTH

## 2018-09-30 NOTE — Nurses Notes (Signed)
Pt in room 3 for lung OTV. Pt to see MAS before tx due to missed tx days.  Pt denies any complaints or changes to medication/allergies. Jimmye Norman, RN      Pt to resume tx per MAS. Jimmye Norman, RN

## 2018-09-30 NOTE — Progress Notes (Signed)
6300 rad  Almost done  Was inpt  Now d/c  No major c/o  Seems to be doing OK  cpm

## 2018-10-01 ENCOUNTER — Emergency Department
Admission: EM | Admit: 2018-10-01 | Discharge: 2018-10-01 | Disposition: A | Discharge status: Home / Self Care | Payer: Medicare Other | Attending: Emergency Medicine | Admitting: Emergency Medicine

## 2018-10-01 ENCOUNTER — Emergency Department (HOSPITAL_COMMUNITY): Payer: Medicare Other

## 2018-10-01 ENCOUNTER — Other Ambulatory Visit: Payer: Self-pay

## 2018-10-01 ENCOUNTER — Telehealth (HOSPITAL_COMMUNITY): Payer: Self-pay | Admitting: Nurse Practitioner

## 2018-10-01 ENCOUNTER — Encounter (HOSPITAL_COMMUNITY): Payer: Self-pay

## 2018-10-01 ENCOUNTER — Ambulatory Visit (HOSPITAL_COMMUNITY): Payer: Medicare Other

## 2018-10-01 ENCOUNTER — Encounter (HOSPITAL_COMMUNITY): Payer: Self-pay | Admitting: Nurse Practitioner

## 2018-10-01 DIAGNOSIS — Z87891 Personal history of nicotine dependence: Secondary | ICD-10-CM | POA: Insufficient documentation

## 2018-10-01 DIAGNOSIS — J449 Chronic obstructive pulmonary disease, unspecified: Secondary | ICD-10-CM | POA: Insufficient documentation

## 2018-10-01 DIAGNOSIS — R000 Tachycardia, unspecified: Secondary | ICD-10-CM

## 2018-10-01 DIAGNOSIS — G4730 Sleep apnea, unspecified: Secondary | ICD-10-CM | POA: Insufficient documentation

## 2018-10-01 DIAGNOSIS — Z85118 Personal history of other malignant neoplasm of bronchus and lung: Secondary | ICD-10-CM | POA: Insufficient documentation

## 2018-10-01 DIAGNOSIS — Z85038 Personal history of other malignant neoplasm of large intestine: Secondary | ICD-10-CM | POA: Insufficient documentation

## 2018-10-01 DIAGNOSIS — E785 Hyperlipidemia, unspecified: Secondary | ICD-10-CM | POA: Insufficient documentation

## 2018-10-01 DIAGNOSIS — I4891 Unspecified atrial fibrillation: Secondary | ICD-10-CM | POA: Insufficient documentation

## 2018-10-01 DIAGNOSIS — I10 Essential (primary) hypertension: Secondary | ICD-10-CM | POA: Insufficient documentation

## 2018-10-01 DIAGNOSIS — Z7901 Long term (current) use of anticoagulants: Secondary | ICD-10-CM | POA: Insufficient documentation

## 2018-10-01 DIAGNOSIS — R0600 Dyspnea, unspecified: Secondary | ICD-10-CM | POA: Insufficient documentation

## 2018-10-01 DIAGNOSIS — Z79899 Other long term (current) drug therapy: Secondary | ICD-10-CM | POA: Insufficient documentation

## 2018-10-01 LAB — CBC WITH DIFF
BASOPHIL #: 0 x10ˆ3/uL (ref 0.00–0.20)
BASOPHIL %: 1 %
EOSINOPHIL #: 0 x10ˆ3/uL (ref 0.00–0.50)
EOSINOPHIL %: 1 %
HCT: 37.3 % (ref 34.6–46.2)
HGB: 12.2 g/dL (ref 11.8–15.8)
LYMPHOCYTE #: 0.7 10*3/uL — ABNORMAL LOW (ref 0.90–3.40)
LYMPHOCYTE #: 0.7 x10ˆ3/uL — ABNORMAL LOW (ref 0.90–3.40)
LYMPHOCYTE %: 14 %
MCH: 28.6 pg (ref 27.6–33.2)
MCHC: 32.6 g/dL (ref 32.6–35.4)
MCV: 87.7 fL (ref 82.3–96.7)
MONOCYTE #: 0.7 x10ˆ3/uL (ref 0.20–0.90)
MONOCYTE %: 16 %
MPV: 8 fL (ref 6.6–10.2)
NEUTROPHIL #: 3.3 x10ˆ3/uL (ref 1.50–6.40)
NEUTROPHIL %: 68 %
PLATELETS: 177 x10ˆ3/uL (ref 140–440)
RBC: 4.25 x10ˆ6/uL (ref 3.80–5.24)
RDW: 23.7 % — ABNORMAL HIGH (ref 12.4–15.2)
WBC: 4.8 x10ˆ3/uL (ref 3.5–10.3)

## 2018-10-01 LAB — VENOUS BLOOD GAS
%FIO2 (VENOUS): 21 %
BASE DEFICIT: 2.5 mmol/L (ref ?–2.0)
BICARBONATE (VENOUS): 22.6 mmol/L — ABNORMAL LOW (ref 23.0–33.0)
O2 SATURATION (VENOUS): 80.2 % — ABNORMAL LOW (ref 95.0–98.0)
PCO2 (VENOUS): 35 mmHg — ABNORMAL LOW (ref 38.00–50.00)
PH (VENOUS): 7.4 (ref 7.32–7.45)
PO2 (VENOUS): 49 mmHg (ref 30.0–50.0)

## 2018-10-01 LAB — BASIC METABOLIC PANEL
ANION GAP: 10 mmol/L
BUN/CREA RATIO: 17
BUN: 16 mg/dL (ref 10–25)
CALCIUM: 9 mg/dL (ref 8.8–10.3)
CHLORIDE: 105 mmol/L (ref 98–111)
CO2 TOTAL: 22 mmol/L (ref 21–35)
CREATININE: 0.95 mg/dL (ref <=1.30)
ESTIMATED GFR: >60 mL/min/1.73m?2
ESTIMATED GFR: >60 mL/min/1.73mˆ2
GLUCOSE: 170 mg/dL — ABNORMAL HIGH (ref 70–110)
POTASSIUM: 3.7 mmol/L (ref 3.5–5.0)
SODIUM: 137 mmol/L (ref 135–145)

## 2018-10-01 LAB — PT/INR: INR: 1.5 — ABNORMAL HIGH (ref 0.80–1.10)

## 2018-10-01 LAB — PHOSPHORUS: PHOSPHORUS: 3.8 mg/dL (ref 2.5–4.5)

## 2018-10-01 LAB — THYROID STIMULATING HORMONE WITH FREE T4 REFLEX: TSH: 3.59 u[IU]/mL (ref 0.450–5.330)

## 2018-10-01 LAB — TROPONIN-I: TROPONIN I: 0.01 ng/mL (ref <=0.04)

## 2018-10-01 LAB — MAGNESIUM: MAGNESIUM: 1.8 mg/dL (ref 1.8–2.3)

## 2018-10-01 LAB — B-TYPE NATRIURETIC PEPTIDE (BNP),PLASMA: BNP: 70 pg/mL (ref 0–100)

## 2018-10-01 MED ORDER — ONDANSETRON HCL (PF) 4 MG/2 ML INJECTION SOLUTION
4.00 mg | INTRAMUSCULAR | Status: AC
Start: 2018-10-01 — End: 2018-10-01
  Administered 2018-10-01: 4 mg via INTRAVENOUS
  Filled 2018-10-01: qty 2

## 2018-10-01 MED ORDER — SODIUM CHLORIDE 0.9 % IV BOLUS
500.00 mL | INJECTION | Status: AC
Start: 2018-10-01 — End: 2018-10-01
  Administered 2018-10-01: 500 mL via INTRAVENOUS
  Administered 2018-10-01: 0 mL via INTRAVENOUS

## 2018-10-01 NOTE — Telephone Encounter (Signed)
1512:  Called and spoke with patient since patient was recently discharged from hospital.  Patient states she has not had any fever or chills.  Patient denies any night sweats.  Patient is currently under the care of cardiology for AFib.  Patient does report she feels her heart rate is not under control, reports significant dyspnea and is possibly considering coming to the emergency room.  I concurred with patient if she is having symptoms she should be evaluated.  Patient verbalized understanding.Omer Jack, APRN, FNP-C

## 2018-10-01 NOTE — ED Provider Notes (Signed)
[place]  Emergency Department  Attending Note    Identification:  Name: Kelsey [name]  Age and Gender: 68 y.o. female  Date of Birth: [birth year]  Date of Service: 10/01/2018  MRN: [id number]  PCP: [facility]    Code Status: full    Arrival: The patient arrived by private car and is accompanied by husband.    History Obtained from: Patient..    History Limitations: none    CC:  Atrial fibrillation     HPI:  Kelsey [name] is a 68 y.o. White female presenting with complaint of atrial fibrillation. The patient states she has been intermittently going into atrial fibrillation since she was discharged 2 days ago. She additionally complains of weakness and fatigue. She denies chest pain and leg swelling. The patient reports a pmhx of hypertension, hypercholesterolemia, and stage 3 lung cancer for which she received her last radiation today. Pt reports taking eliquis.     ROS:  Constitutional: - fever, - chills, + weakness +fatigue  Skin: - rash, - diaphoresis  HENT: - headaches, - congestion  Eyes: - vision changes   Cardiovascular: - chest pain, - palpitations, - edema +atrial fibrillation   Respiratory: - cough, - wheezing, - SOB  GI: - nausea, - vomiting, - diarrhea, - constipation, - abdominal pain  GU: - dysuria, - hematuria, - polyuria  MSK: - joint pain, - back pain  Neuro: - loss of sensation, - confusion, - focal deficits, - paresthesias  Psychiatric: - mood changes. - SI/HI/AVH. - EtOH, - tobacco, - marijuana, - cocaine, - amphetamines, - heroin, - opiates.  All other systems reviewed and are negative.    Medications:  Medications reviewed with patient/patient's family.  Medications Prior to Admission     Prescriptions    albuterol sulfate (PROVENTIL) 2.5 mg /3 mL (0.083 %) Inhalation Solution for Nebulization    3 mL (2.5 mg total) by Nebulization route Every 6 hours    amiodarone (PACERONE) 200 mg Oral Tablet    Take 1 Tab (200 mg total) by mouth Twice daily for 14 days    amLODIPine  (NORVASC) 5 mg Oral Tablet    Take 1 Tab (5 mg total) by mouth Once a day for 30 days    apixaban (ELIQUIS) 5 mg Oral Tablet    Take 1 Tab (5 mg total) by mouth Twice daily    atorvastatin (LIPITOR) 10 mg Oral Tablet    Take 10 mg by mouth Once a day    budesonide-formoterol (SYMBICORT) 160-4.5 mcg/actuation Inhalation HFA Aerosol Inhaler    Take 2 Puffs by inhalation Twice daily    docusate sodium (COLACE) 100 mg Oral Capsule    Take 100 mg by mouth Three times a day as needed     DULoxetine (CYMBALTA DR) 30 mg Oral Capsule, Delayed Release(E.C.)    Take 30 mg by mouth Once a day    linaCLOtide (LINZESS) 72 mcg Oral Capsule    Take 72 mcg by mouth Every morning    loperamide (IMODIUM) 2 mg Oral Capsule    Take 2 mg by mouth Every 4 hours as needed    loratadine (CLARITIN) 10 mg Oral Tablet    Take 1 Tab (10 mg total) by mouth Once a day    magnesium oxide (MAG-OX) 400 mg Oral Tablet    Take 400 mg by mouth Once a day    omeprazole (PRILOSEC) 20 mg Oral Capsule, Delayed Release(E.C.)    Take 40 mg  by mouth Once a day     oxyCODONE (ROXICODONE) 5 mg Oral Tablet    Take 1 Tab (5 mg total) by mouth Every 8 hours as needed for Pain    potassium chloride (K-DUR) 20 mEq Oral Tab Sust.Rel. Particle/Crystal    Take 20 mEq by mouth Once a day    prochlorperazine (COMPAZINE) 10 mg Oral Tablet    Take 1 Tab (10 mg total) by mouth Four times a day as needed for nausea/vomiting    rOPINIRole (REQUIP) 0.5 mg Oral Tablet    Take 0.5 mg by mouth Every night          Allergies:  Allergies reviewed with patient/patient's family.  Allergies   Allergen Reactions   . Shellfish Containing Products Shortness of Breath, Rash and Itching     scallops   . Vancomycin Shortness of Breath   . Barium Iodide    . Iodine And Iodide Containing Products      Itching, shortness of breath        PMSFSH:  Past medical, surgical, family and social history reviewed with patient/patient's family.  Past Medical History:   Diagnosis Date   . A-fib (CMS HCC)     . Arthropathy, unspecified, site unspecified    . Asthma    . Cancer (CMS HCC)     cervical   . Cataract    . Colon cancer (CMS [place]) 07/06/2014   . COPD (chronic obstructive pulmonary disease) (CMS HCC)    . Fistula    . HTN (hypertension)    . Hyperlipidemia    . Obesity    . Sleep apnea    . Ventral hernia      Past Surgical History:   Procedure Laterality Date   . Abcess drainage     . Bowel resection     . Cataract extraction     . Hx cervical cone biopsy      . Hx cesarean section     . Hx gastric bypass     . Hx hernia repair     . Hx hysterectomy     . Hx lap banding     . Hx laparotomy       Family History   Problem Relation Age of Onset   . Diabetes Mother    . Heart Attack Mother    . Diabetes Maternal Grandmother    . Diabetes Maternal Grandfather    . Diabetes Father    . Stroke Father      Social History     Socioeconomic History   . Marital status: Married     Spouse name: Not on file   . Number of children: Not on file   . Years of education: Not on file   . Highest education level: Not on file   Occupational History   . Not on file   Social Needs   . Financial resource strain: Not on file   . Food insecurity     Worry: Not on file     Inability: Not on file   . Transportation needs     Medical: Not on file     Non-medical: Not on file   Tobacco Use   . Smoking status: Former Research scientist (life sciences)   . Smokeless tobacco: Never Used   . Tobacco comment: quit 10 yrs ago   Substance and Sexual Activity   . Alcohol use: Not Currently     Comment: rarely   . Drug use:  No   . Sexual activity: Not on file   Lifestyle   . Physical activity     Days per week: Not on file     Minutes per session: Not on file   . Stress: Not on file   Relationships   . Social Product manager on phone: Not on file     Gets together: Not on file     Attends religious service: Not on file     Active member of club or organization: Not on file     Attends meetings of clubs or organizations: Not on file     Relationship status: Not on file     . Intimate partner violence     Fear of current or ex partner: Not on file     Emotionally abused: Not on file     Physically abused: Not on file     Forced sexual activity: Not on file   Other Topics Concern   . Ability to Walk 1 Flight of Steps without SOB/CP No   . Routine Exercise Not Asked   . Ability to Walk 2 Flight of Steps without SOB/CP Not Asked   . Unable to Ambulate Not Asked   . Total Care Not Asked   . Ability To Do Own ADL's Yes   . Uses Walker Not Asked   . Other Activity Level Not Asked   . Uses Cane Not Asked   Social History Narrative   . Not on file       Pertinent Physical Exam:   All nurse's notes reviewed.  BP (!) 141/76   Pulse (!) 102   Temp 36 C (96.8 F)   Resp 15   Ht 1.575 m (_0 )   Wt 124.7 kg (275 lb)   SpO2 96%   BMI 50.30 kg/m   Constitutional: NAD. A&Ox3. Good historian.   Head: NC/AT. MMM. PERRLA. EOMI.  Heart: Tachycardic and regular. No m/r/g.  Lungs: CTAB. No distress.  Abdomen: +BS. Soft. NT/ND.   Musculoskeletal: No deformity or edema.  Skin: Warm and dry. No rash, erythema, jaundice, cyanosis. Port present to right chest.   Neurologic: CNs grossly intact. Neurovascularly intact bilaterally. No focal neurological deficits.  Psychiatric: Normal affect and mood.    Orders:  Results up to the Time the Disposition was Entered   BASIC METABOLIC PANEL - Abnormal; Notable for the following components:       Result Value    GLUCOSE 170 (*)     All other components within normal limits    Narrative:     Estimated Glomerular Filtration Rate (eGFR) calculated using the CKD-EPI (2009) equation, intended for patients 72 years of age and older. If race and/or gender is not documented or "unknown," there will be no eGFR calculation.   PT/INR - Abnormal; Notable for the following components:    INR 1.50 (*)     All other components within normal limits    Narrative:     Coumadin Therapy INR range for Conventional Anticoagulation Therapy is 2.0 to 3.0 and for Intensive  Anticoagulation Therapy 2.5 to 3.5   VENOUS BLOOD GAS - Abnormal; Notable for the following components:    BICARBONATE (VENOUS) 22.6 (*)     PCO2 (VENOUS) 35.00 (*)     O2 SATURATION (VENOUS) 80.2 (*)     All other components within normal limits   CBC WITH DIFF - Abnormal; Notable for the following components:  RDW 23.7 (*)     LYMPHOCYTE # 0.70 (*)     All other components within normal limits   B-TYPE NATRIURETIC PEPTIDE - Normal   TROPONIN-I - Normal   THYROID STIMULATING HORMONE WITH FREE T4 REFLEX - Normal   MAGNESIUM - Normal   PHOSPHORUS - Normal   ECG 12 LEAD - ED USE    Narrative:     -------------------------ECG Interpretation-----------------------------------  Sinus tachycardia  Borderline prolonged QT interval  .  .  .  .  MD Signature: Unconfirmed Diagnosis   CBC/DIFF    Narrative:     The following orders were created for panel order CBC/DIFF.  Procedure                               Abnormality         Status                     ---------                               -----------         ------                     CBC WITH DIFF[[id number]]                Abnormal            Final result                 Please view results for these tests on the individual orders.   XR CHEST PA AND LATERAL    Narrative:     Skyy Ehle    PROCEDURE DESCRIPTION: XR CHEST PA AND LATERAL    PROCEDURE PERFORMED DATE AND TIME: 10/01/2018 6:00 PM    CLINICAL INDICATION: chest pain    TECHNIQUE: 2 views / 2 images submitted.    COMPARISON: 09/25/2018       DIET NPO NOW   NS bolus infusion 500 mL (0 mL Intravenous Stopped 10/01/18 2002)   ondansetron (ZOFRAN) 2 mg/mL injection (4 mg Intravenous Given 10/01/18 1931)       EKG:  Sinus tachycardia.  Normal axis.  Prolonged QTC interval.  Slight ST depression in 1, aVL, V3 through V6.  No change from previous EKG.    Course:   Patient seen and examined.  Vital signs show slight tachycardia.  Physical exam as above.  EKG as above.  Baseline lab work without abnormality.  Chest  x-ray shows right infrahilar mass that is seen.  Results were discussed with the patient.  She was given the opportunity to ask questions.  Had lengthy discussion with patient about supportive care and return to emergency department precautions.  No new prescriptions.  Patient discharged home with plan to follow with primary care physician within the next 2-3 days.  While in the emergency department, patient received 500 cc normal saline IV fluid bolus and 4 mg Zofran.  All questions answered.  Patient agreeable plan.  Upon repeat examination, patient doing well and without complaints.    Consults:   None     Impression:   Encounter Diagnosis   Name Primary?   Marland Kitchen Dyspnea, unspecified type Yes       Disposition:    Discharged. Patient/patient's family was advised to return to the ED  with any new, concerning or worsening symptoms and follow up as directed. The patient/patient's family verbalized understanding of all instructions and had no further questions or concerns.     I am scribing for, and in the presence of, Dr. Dava Najjar for services provided on 10/01/2018.  Estill Cutlip, SCRIBE     I personally performed the services described in this documentation, as scribed in my presence, it is both accurate and complete.    Ellan Lambert, MD  10/01/2018, 23:30  [place] of Emergency Medicine    Parts of this patient's chart was completed in a retrospective fashion due to simultaneous direct patient care in the Emergency Department.   This note was partially generated using MModal Fluency Direct system, and there may be some incorrect words, spelling, and punctuation that were not noted before saving.

## 2018-10-01 NOTE — Discharge Instructions (Signed)
Informational handouts given on discharge.  Resume home diet, as tolerated.  Gradually increase activity, as tolerated.  Return to the ED with any new, concerning or worsening symptoms and follow up as directed.    Prescriptions:   New Prescriptions    No medications on file       Follow Up:    [facility] - Emergency Department  [place] [id number]  [phone]    As needed, If symptoms worsen    Center, [place]  [place]  [place] [address]  [phone]    Call       Future Appointments   Date Time Provider [place]   10/02/2018  9:45 AM TREATMENT MACHINE EKL [place] [facility]   10/03/2018  9:45 AM TREATMENT MACHINE EKL [place] [facility]   10/06/2018  9:45 AM TREATMENT MACHINE EKL [place] [facility]   10/07/2018  9:45 AM TREATMENT MACHINE EKL [place] [facility]   10/30/2018 12:15 PM [place] [facility]/PET [place] [facility]   10/31/2018 10:00 AM Vanetta Mulders, MD [facility]   11/21/2018  8:15 AM Riley Churches, MD [facility] None

## 2018-10-02 ENCOUNTER — Inpatient Hospital Stay
Admission: RE | Admit: 2018-10-02 | Discharge: 2018-10-02 | Disposition: A | Discharge status: Still In Hospital | Payer: Medicare Other | Source: Ambulatory Visit | Attending: Radiation Oncology | Admitting: Radiation Oncology

## 2018-10-02 LAB — ECG 12 LEAD - ED USE
Calculated P Axis: 81 deg
Calculated T Axis: 78 deg
Calculated T Axis: 78 deg
EKG Severity: BORDERLINE
Heart Rate: 107 {beats}/min
I 40 Axis: 12 deg
PR Interval: 146 ms
QRS Axis: 60 deg
QRS Duration: 88 ms
QT Interval: 366 ms
QTC Calculation: 489 ms
ST Axis: 134 deg
T 40 Axis: 85 deg

## 2018-10-02 NOTE — Nurses Notes (Signed)
Pt in room 3 to see MAS due to pt's visit to ER last night.  Pt verbalized the visit was due to heart palpitations. Pt not admitted. Jimmye Norman, RN    Resume tx per [employer].  Technologist April notified. Jimmye Norman, RN

## 2018-10-03 ENCOUNTER — Ambulatory Visit (HOSPITAL_COMMUNITY): Payer: Medicare Other

## 2018-10-03 NOTE — Nurses Notes (Signed)
Telephone encounter with pt. Pt verbalized she would not be able to make apt due to icy roads. Jimmye Norman, RN

## 2018-10-06 ENCOUNTER — Ambulatory Visit (HOSPITAL_COMMUNITY): Payer: Medicare Other

## 2018-10-07 ENCOUNTER — Ambulatory Visit (HOSPITAL_COMMUNITY): Payer: Self-pay | Admitting: Radiation Oncology

## 2018-10-07 ENCOUNTER — Inpatient Hospital Stay (HOSPITAL_COMMUNITY)
Admission: RE | Admit: 2018-10-07 | Disposition: A | Discharge status: Still In Hospital | Payer: Medicare Other | Source: Ambulatory Visit

## 2018-10-07 NOTE — Nurses Notes (Signed)
Pt late for appointment with no word from pt.  Called and left message on voicemail. Jimmye Norman, RN

## 2018-10-08 ENCOUNTER — Encounter (HOSPITAL_COMMUNITY): Payer: Self-pay | Admitting: Radiation Oncology

## 2018-10-08 ENCOUNTER — Ambulatory Visit (HOSPITAL_COMMUNITY): Payer: Medicare Other

## 2018-10-08 ENCOUNTER — Telehealth (HOSPITAL_COMMUNITY): Payer: Self-pay | Admitting: Nurse Practitioner

## 2018-10-08 NOTE — Telephone Encounter (Signed)
Spoke with patient and she states she is feeling much better.  Patient reports no symptoms.  Patient follows up with Dr. Deatra Canter on 10/31/2018.  Omer Jack, APRN, FNP-C

## 2018-10-09 ENCOUNTER — Ambulatory Visit (HOSPITAL_COMMUNITY): Payer: Medicare Other

## 2018-10-09 NOTE — Progress Notes (Signed)
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    NARRATIVE:  Ms. Toppins treatment is being terminated due to significant noncompliance and inability to make it for treatment.  She has been on treatment for more than 2 months, which has essentially eliminated much of the efficacy of her treatment.  Nonetheless, she was treated from July 23, 2018, through October 02, 2018, and received 6480 rads in 36 treatments.     No followup arrangements are being made.        Johnanna Schneiders, MD              CC:   Vanetta Mulders, MD   Fax: [phone]     [facility]   [address]   [place]   [place], [place] [address]     Almon Hercules, MD   Fax: [phone]       DD:  10/08/2018 13:49:30  DT:  10/08/2018 14:40:46 ES  D#:  [id number]

## 2018-10-10 ENCOUNTER — Ambulatory Visit (HOSPITAL_COMMUNITY): Payer: Medicare Other

## 2018-10-24 ENCOUNTER — Encounter (HOSPITAL_COMMUNITY): Payer: Self-pay | Admitting: Nurse Practitioner

## 2018-10-24 NOTE — Nursing Note (Signed)
PET scan is scheduled on 10/30/2018 for 12:15 pm @ [facility]. Instructed patient to arrive 15-30 minutes prior to testing to register. Informed of diet in full detail, instructing no sugar or carbohydrates the day prior to scan. Nothing but plain water 6 hours prior to scan. Instructed patient to read over instructions thoroughly. Informed patient that if prep is not followed the test will be rescheduled. LM on VM with appointment date/time/location/instructions. Advised to call back with any questions or concerns (my direct phone# was given). Mailed patient copy of orders and PET scan instructions. Marquis Buggy, MA  10/24/2018, 11:24

## 2018-10-30 ENCOUNTER — Other Ambulatory Visit: Payer: Self-pay

## 2018-10-30 ENCOUNTER — Ambulatory Visit
Admission: RE | Admit: 2018-10-30 | Discharge: 2018-10-30 | Disposition: A | Discharge status: Home / Self Care | Payer: Medicare Other | Source: Ambulatory Visit | Attending: Nurse Practitioner | Admitting: Nurse Practitioner

## 2018-10-30 DIAGNOSIS — C3490 Malignant neoplasm of unspecified part of unspecified bronchus or lung: Secondary | ICD-10-CM

## 2018-10-30 DIAGNOSIS — C3431 Malignant neoplasm of lower lobe, right bronchus or lung: Secondary | ICD-10-CM | POA: Insufficient documentation

## 2018-10-30 LAB — POC BLOOD GLUCOSE (RESULTS): GLUCOSE, POC: 104 mg/dL (ref 70–110)

## 2018-10-31 ENCOUNTER — Encounter (HOSPITAL_COMMUNITY): Payer: Self-pay | Admitting: Internal Medicine

## 2018-10-31 ENCOUNTER — Inpatient Hospital Stay
Admission: RE | Admit: 2018-10-31 | Discharge: 2018-10-31 | Disposition: A | Discharge status: Still In Hospital | Payer: Medicare Other | Source: Ambulatory Visit | Attending: Internal Medicine | Admitting: Internal Medicine

## 2018-10-31 ENCOUNTER — Encounter (HOSPITAL_COMMUNITY): Payer: Self-pay

## 2018-10-31 ENCOUNTER — Other Ambulatory Visit (HOSPITAL_COMMUNITY): Payer: Self-pay | Admitting: Internal Medicine

## 2018-10-31 ENCOUNTER — Ambulatory Visit (HOSPITAL_COMMUNITY)
Admission: RE | Admit: 2018-10-31 | Discharge: 2018-10-31 | Disposition: A | Discharge status: Home / Self Care | Payer: Medicare Other | Source: Ambulatory Visit

## 2018-10-31 ENCOUNTER — Ambulatory Visit (HOSPITAL_BASED_OUTPATIENT_CLINIC_OR_DEPARTMENT_OTHER): Payer: Medicare Other | Admitting: Internal Medicine

## 2018-10-31 VITALS — BP 147/87 | HR 88 | Temp 97.1°F | Ht 62.0 in | Wt 276.0 lb

## 2018-10-31 VITALS — BP 147/87 | HR 88 | Temp 97.1°F | Resp 18

## 2018-10-31 DIAGNOSIS — E669 Obesity, unspecified: Secondary | ICD-10-CM | POA: Insufficient documentation

## 2018-10-31 DIAGNOSIS — R112 Nausea with vomiting, unspecified: Secondary | ICD-10-CM | POA: Insufficient documentation

## 2018-10-31 DIAGNOSIS — R52 Pain, unspecified: Secondary | ICD-10-CM | POA: Insufficient documentation

## 2018-10-31 DIAGNOSIS — I4891 Unspecified atrial fibrillation: Secondary | ICD-10-CM | POA: Insufficient documentation

## 2018-10-31 DIAGNOSIS — C3490 Malignant neoplasm of unspecified part of unspecified bronchus or lung: Secondary | ICD-10-CM

## 2018-10-31 DIAGNOSIS — J449 Chronic obstructive pulmonary disease, unspecified: Secondary | ICD-10-CM | POA: Insufficient documentation

## 2018-10-31 DIAGNOSIS — Z85038 Personal history of other malignant neoplasm of large intestine: Secondary | ICD-10-CM | POA: Insufficient documentation

## 2018-10-31 DIAGNOSIS — Z87891 Personal history of nicotine dependence: Secondary | ICD-10-CM | POA: Insufficient documentation

## 2018-10-31 DIAGNOSIS — Z5112 Encounter for antineoplastic immunotherapy: Secondary | ICD-10-CM | POA: Insufficient documentation

## 2018-10-31 DIAGNOSIS — E785 Hyperlipidemia, unspecified: Secondary | ICD-10-CM | POA: Insufficient documentation

## 2018-10-31 DIAGNOSIS — T451X5A Adverse effect of antineoplastic and immunosuppressive drugs, initial encounter: Secondary | ICD-10-CM | POA: Insufficient documentation

## 2018-10-31 DIAGNOSIS — C3431 Malignant neoplasm of lower lobe, right bronchus or lung: Secondary | ICD-10-CM | POA: Insufficient documentation

## 2018-10-31 DIAGNOSIS — Z7901 Long term (current) use of anticoagulants: Secondary | ICD-10-CM | POA: Insufficient documentation

## 2018-10-31 DIAGNOSIS — G4730 Sleep apnea, unspecified: Secondary | ICD-10-CM | POA: Insufficient documentation

## 2018-10-31 DIAGNOSIS — D701 Agranulocytosis secondary to cancer chemotherapy: Secondary | ICD-10-CM | POA: Insufficient documentation

## 2018-10-31 DIAGNOSIS — Z79899 Other long term (current) drug therapy: Secondary | ICD-10-CM | POA: Insufficient documentation

## 2018-10-31 DIAGNOSIS — I10 Essential (primary) hypertension: Secondary | ICD-10-CM | POA: Insufficient documentation

## 2018-10-31 DIAGNOSIS — Z9884 Bariatric surgery status: Secondary | ICD-10-CM | POA: Insufficient documentation

## 2018-10-31 LAB — CBC WITH DIFF
BASOPHIL #: 0 x10ˆ3/uL (ref 0.00–0.20)
BASOPHIL %: 0 %
EOSINOPHIL #: 0.1 10*3/uL (ref 0.00–0.50)
EOSINOPHIL %: 1 %
HCT: 39.1 % (ref 34.6–46.2)
HGB: 12.7 g/dL (ref 11.8–15.8)
LYMPHOCYTE #: 0.6 x10ˆ3/uL — ABNORMAL LOW (ref 0.90–3.40)
LYMPHOCYTE %: 7 %
MCH: 29.1 pg (ref 27.6–33.2)
MCHC: 32.4 g/dL — ABNORMAL LOW (ref 32.6–35.4)
MCV: 89.8 fL (ref 82.3–96.7)
MONOCYTE #: 0.7 x10ˆ3/uL (ref 0.20–0.90)
MONOCYTE %: 8 %
MPV: 7.8 fL (ref 6.6–10.2)
NEUTROPHIL #: 6.7 x10ˆ3/uL — ABNORMAL HIGH (ref 1.50–6.40)
NEUTROPHIL %: 83 %
PLATELETS: 219 x10ˆ3/uL (ref 140–440)
RBC: 4.35 10*6/uL (ref 3.80–5.24)
RDW: 17 % — ABNORMAL HIGH (ref 12.4–15.2)
WBC: 8 x10ˆ3/uL (ref 3.5–10.3)

## 2018-10-31 LAB — COMPREHENSIVE METABOLIC PANEL, NON-FASTING
ALBUMIN: 3.7 g/dL (ref 3.2–4.6)
ALKALINE PHOSPHATASE: 83 U/L (ref 20–130)
ALT (SGPT): 30 U/L (ref <=52)
ANION GAP: 8 mmol/L
AST (SGOT): 50 U/L — ABNORMAL HIGH (ref <=35)
BILIRUBIN TOTAL: 0.7 mg/dL (ref 0.3–1.2)
BUN/CREA RATIO: 17
BUN: 16 mg/dL (ref 10–25)
CALCIUM: 8.9 mg/dL (ref 8.8–10.3)
CHLORIDE: 106 mmol/L (ref 98–111)
CO2 TOTAL: 25 mmol/L (ref 21–35)
CREATININE: 0.92 mg/dL (ref <=1.30)
ESTIMATED GFR: >60 mL/min/1.73mˆ2
GLUCOSE: 152 mg/dL — ABNORMAL HIGH (ref 70–110)
POTASSIUM: 3.7 mmol/L (ref 3.5–5.0)
PROTEIN TOTAL: 6.3 g/dL (ref 6.0–8.3)
SODIUM: 139 mmol/L (ref 135–145)

## 2018-10-31 MED ORDER — ONDANSETRON HCL (PF) 4 MG/2 ML INJECTION SOLUTION
8.00 mg | INTRAMUSCULAR | Status: AC
Start: 2018-10-31 — End: 2018-10-31
  Administered 2018-10-31: 8 mg via INTRAVENOUS
  Filled 2018-10-31: qty 4

## 2018-10-31 MED ORDER — DURVALUMAB 50 MG/ML INTRAVENOUS SOLUTION
10.00 mg/kg | Freq: Once | INTRAVENOUS | Status: AC
Start: 2018-10-31 — End: 2018-10-31
  Administered 2018-10-31: 1250 mg via INTRAVENOUS
  Administered 2018-10-31: 0 mg via INTRAVENOUS
  Filled 2018-10-31: qty 25

## 2018-10-31 NOTE — Nursing Note (Signed)
Navigator: Met with pt and husband in the exam room with Dr. Deatra Canter. Dr. Deatra Canter reviewed pt recent PET scan results. Plan is for pt to start [place]. Dr. Deatra Canter reviewed immunotherapy and why and how it is used. All questions and concerns were addressed. I provided the teaching handout. I encouraged them to call me with any questions or concerns.

## 2018-10-31 NOTE — Progress Notes (Signed)
[place] [id number]        Encounter Date: 10/31/2018  10:00 AM EDT    Name:  Kelsey [name]  Age: 68 y.o.  DOB: [birth year]  Sex: female  PCP: [facility]    Chief Complaint:    Chief Complaint   Patient presents with   . Lung Cancer     HISTORY OF PRESENT ILLNESS:   68 y.o. female with history of stage III colon cancer of present evaluation and management of lung cancer.    1. September 2015. Patient underwent sigmoidoscopy and was found to have polyp which was adenocarcinoma.    2. June 10, 2014. She was admitted for segmental resection of sigmoid colon. This revealed pT3, N1a, MX, low-grade  adenocarcinoma of the colon. The mass measuring 4 x 3 x 0.5 centimeters. It  was low-grade and there was no evidence of microsatellite instability by  histology. Margins were uninvolved and 4 lymph nodes only were removed which  only 1 was involved. This corresponding stage IIIB, T3, N1a, M0, low-grade  sigmoid cancer.     3. July 07, 2014. Recommended adjuvant chemotherapy with FOLFOX.    4. July 27, 2014. Patient was started on adjuvant chemotherapy with FOLFOX.  Patient received only 5 treatments and chemotherapy was stopped due to poor tolerance.  After that patient lost follow-up.      5. October 2019. Patient was admitted to hospital with concern for  bowel obstruction.  CT chest showed lung nodules.  Patient was managed conservatively and discharged home.    6. May 06, 2018.  Patient underwent CT-guided biopsy of lung lesion.  Pathology showed well-differentiated adenocarcinoma.  Section shows well-differentiated adenocarcinoma which positive for CK 7 and TTF 1, negative for P 63 and CK 20.     7. June 23, 2018. PET scan showed Hypermetabolic right lower lobe pulmonary mass compatible with malignancy.  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 Hepatomegaly and  steatosis.     8. July 23, 2018. Patient was started on chemoradiation.    9. October 30, 2018. PET scan showed Hypermetabolic right lower lobe mass with abnormal mediastinal and hilar nodes. There has been only mild improvement since earlier exam.    10. October 31, 2018. Patient was started on maintenance immunotherapy with durvalumab.    SUBJECTIVE:  Patient here for follow-up and treatment.  Denies any new issues since last visit.  Denies any worsening shortness of breath or chest pain.    REVIEW OF SYSTEMS:  GENERAL:   Denies fever or chills.    HEENT: no sore throat, congestion, blurry vision  Lungs:  No shortness of breath, reports cough and congestion.  GI: no nausea, vomiting, constipation, no diarrhea  GU: No dysuria, urgency, increased frequency   Cardiac: no chest pains, palpitations, syncope,   Neuro: no weakness, loss of function, numbness, tingling  Skin: no rash  Musculoskeletal: No myalgias  Psychosocial: No depression, anxiety  Hematologic: No easy bruising, abnormal bleeding  All other review ROS negative except those mentioned above.     PATIENT HISTORY:  Past Medical History:   Diagnosis Date   . A-fib (CMS HCC)    . Arthropathy,  unspecified, site unspecified    . Asthma    . Cancer (CMS HCC)     cervical   . Cataract    . Colon cancer (CMS [facility]) 07/06/2014   . COPD (chronic obstructive pulmonary disease) (CMS HCC)    . Fistula    . HTN (hypertension)    . Hyperlipidemia    . Obesity    . Sleep apnea    . Ventral hernia        Past Surgical History:   Procedure Laterality Date   . ABCESS DRAINAGE     . BOWEL RESECTION     . CATARACT EXTRACTION     . HX CERVICAL CONE BIOPSY      . HX CESAREAN SECTION     . HX GASTRIC BYPASS      25 years ago   . [place] (x2)   . HX HYSTERECTOMY      late 1990s   . HX LAP BANDING      2013   . HX LAPAROTOMY      2013 to remove lap band       Family Medical History:     Problem Relation (Age of Onset)    Diabetes Mother, Maternal  Grandmother, Maternal Grandfather, Father    Heart Attack Mother    Stroke Father          Current Outpatient Medications   Medication Sig   . albuterol sulfate (PROVENTIL) 2.5 mg /3 mL (0.083 %) Inhalation Solution for Nebulization 3 mL (2.5 mg total) by Nebulization route Every 6 hours   . amiodarone (PACERONE) 200 mg Oral Tablet Take 1 Tab (200 mg total) by mouth Twice daily for 14 days   . amLODIPine (NORVASC) 5 mg Oral Tablet Take 1 Tab (5 mg total) by mouth Once a day for 30 days   . apixaban (ELIQUIS) 5 mg Oral Tablet Take 1 Tab (5 mg total) by mouth Twice daily   . atorvastatin (LIPITOR) 10 mg Oral Tablet Take 10 mg by mouth Once a day   . budesonide-formoterol (SYMBICORT) 160-4.5 mcg/actuation Inhalation HFA Aerosol Inhaler Take 2 Puffs by inhalation Twice daily   . docusate sodium (COLACE) 100 mg Oral Capsule Take 100 mg by mouth Three times a day as needed    . DULoxetine (CYMBALTA DR) 30 mg Oral Capsule, Delayed Release(E.C.) Take 30 mg by mouth Once a day   . linaCLOtide (LINZESS) 72 mcg Oral Capsule Take 72 mcg by mouth Every morning   . loperamide (IMODIUM) 2 mg Oral Capsule Take 2 mg by mouth Every 4 hours as needed   . loratadine (CLARITIN) 10 mg Oral Tablet Take 1 Tab (10 mg total) by mouth Once a day   . magnesium oxide (MAG-OX) 400 mg Oral Tablet Take 400 mg by mouth Once a day   . omeprazole (PRILOSEC) 20 mg Oral Capsule, Delayed Release(E.C.) Take 40 mg by mouth Once a day    . oxyCODONE (ROXICODONE) 5 mg Oral Tablet Take 1 Tab (5 mg total) by mouth Every 8 hours as needed for Pain   . potassium chloride (K-DUR) 20 mEq Oral Tab Sust.Rel. Particle/Crystal Take 20 mEq by mouth Once a day   . prochlorperazine (COMPAZINE) 10 mg Oral Tablet Take 1 Tab (10 mg total) by mouth Four times a day as needed for nausea/vomiting   . rOPINIRole (REQUIP) 0.5 mg Oral Tablet Take 0.5 mg by mouth Every night  Social History     Socioeconomic History   . Marital status: Married     Spouse name: Not on file   .  Number of children: Not on file   . Years of education: Not on file   . Highest education level: Not on file   Tobacco Use   . Smoking status: Former Research scientist (life sciences)   . Smokeless tobacco: Never Used   . Tobacco comment: quit 10 yrs ago   Substance and Sexual Activity   . Alcohol use: Not Currently     Comment: rarely   . Drug use: No   Other Topics Concern   . Ability to Walk 1 Flight of Steps without SOB/CP No   . Ability To Do Own ADL's Yes       PHYSICAL EXAMINATION:  VITALS:   Vitals:    10/31/18 0946   BP: (!) 147/87   Pulse: 88   Temp: 36.2 C (97.1 F)   SpO2: 95%   Weight: 125.2 kg (276 lb)   Height: 1.575 m (_0 )   BMI: 50.59       PHYSICAL EXAM:   Consitutional: No acute distress.    Eyes: EOMI. No discharge. No Jaundice.   ENT: mucous membranes dry. No posterior pharynx lesions.. Neck supple, No palpable masses   Heme/ Lymph: No Cervical, Inguinal, axillary lymh nodes. No Bruising.   Cardiovascular: S1, S2, No murmurs, rubs, or gallops.   Respiratory: Clear to auscultate and percuss B/L, No rhonchi, crackles, or wheezing.   Abdomen: Normal Bowel Sounds, nontender nondistended. No hepatosplenomegaly.   Musculoskeletal: No Edema to the extremities.   Skin: Normal turgor. No Rashes,skin lesions   Psychiatry: Normal Affect   Neuro: No focal deficits. Alert and Oriented x 3      ENCOUNTER ORDERS:  No orders of the defined types were placed in this encounter.      LABORATORY/RADIOLOGICAL DATA: All pertinent labs/radiology were reviewed.   Results for RECHELLE, NIEBLA (MRN [id number]) as of 10/31/2018 11:51   Ref. Range 10/31/2018 09:34   WBC Latest Ref Range: 3.5 - 10.3 x10^3/uL 8.0   HGB Latest Ref Range: 11.8 - 15.8 g/dL 12.7   HCT Latest Ref Range: 34.6 - 46.2 % 39.1   PLATELET COUNT Latest Ref Range: 140 - 440 x10^3/uL 219   RBC Latest Ref Range: 3.80 - 5.24 x10^6/uL 4.35   MCV Latest Ref Range: 82.3 - 96.7 fL 89.8   MCHC Latest Ref Range: 32.6 - 35.4 g/dL 32.4 (L)   MCH Latest Ref Range: 27.6 - 33.2 pg 29.1   RDW  Latest Ref Range: 12.4 - 15.2 % 17.0 (H)   MPV Latest Ref Range: 6.6 - 10.2 fL 7.8   PMN'S Latest Units: % 83   LYMPHOCYTES Latest Units: % 7   EOSINOPHIL Latest Units: % 1   MONOCYTES Latest Units: % 8   BASOPHILS Latest Units: % 0   PMN ABS Latest Ref Range: 1.50 - 6.40 x10^3/uL 6.70 (H)   LYMPHS ABS Latest Ref Range: 0.90 - 3.40 x10^3/uL 0.60 (L)   EOS ABS Latest Ref Range: 0.00 - 0.50 x10^3/uL 0.10   MONOS ABS Latest Ref Range: 0.20 - 0.90 x10^3/uL 0.70   BASOS ABS Latest Ref Range: 0.00 - 0.20 x10^3/uL 0.00   SODIUM Latest Ref Range: 135 - 145 mmol/L 139   POTASSIUM Latest Ref Range: 3.5 - 5.0 mmol/L 3.7   CHLORIDE Latest Ref Range: 98 - 111 mmol/L 106   CARBON DIOXIDE Latest  Ref Range: 21 - 35 mmol/L 25   BUN Latest Ref Range: 10 - 25 mg/dL 16   CREATININE Latest Ref Range: <=1.30 mg/dL 0.92   GLUCOSE Latest Ref Range: 70 - 110 mg/dL 152 (H)   ANION GAP Latest Units: mmol/L 8   BUN/CREAT RATIO Unknown 17   ESTIMATED GLOMERULAR FILTRATION RATE Latest Ref Range: Avg: 85 mL/min/1.64m2 >60   CALCIUM Latest Ref Range: 8.8 - 10.3 mg/dL 8.9   TOTAL PROTEIN Latest Ref Range: 6.0 - 8.3 g/dL 6.3   ALBUMIN Latest Ref Range: 3.2 - 4.6 g/dL 3.7   BILIRUBIN, TOTAL Latest Ref Range: 0.3 - 1.2 mg/dL 0.7   AST (SGOT) Latest Ref Range: <=35 U/L 50 (H)   ALT (SGPT) Latest Ref Range: <=52 U/L 30   ALKALINE PHOSPHATASE Latest Ref Range: 20 - 130 U/L 83       ASSESSMENT AND PLAN:  68y.o. female with history of stage III colon cancer status post 5 cycle of FOLFOX now presenting with stage III lung cancer.  I explained the reason for referred to Hematology/Oncology Clinic.    ICD-10-CM    1. Malignant neoplasm of lung, unspecified laterality, unspecified part of lung (CMS HCC) C34.90    2. CINV (chemotherapy-induced nausea and vomiting) R11.2     T45.1X5A        1. Adenocarcinoma lung:  Stage III.    CT brain to rule out metastatic disease.  NEGATIVE.   Started on chemoradiation carboplatin/Taxol along with radiation- July 23, 2018.  After completion of chemo radiation patient will get PET scan.  If continued to have good response will start maintenance immunotherapy for 1 year.   PET ordered for 4 week post radiation therapy Scheduled for 10/30/2018   Completed 6/6 cycles of Taxol/Carboplatin and will start maintenance immunotherapy therapy following radiation therapy and PET scan    CHEMOTHERAPY:/RADIATION  CYCLE 1/26 DAY   DRUG DOSE TOTAL DOSE % ROUTE SCHEDULE   Carboplatin AUC- 2 205 mg    COMPLETED   Paclitaxel 45 mg/m2 108 mg    COMPLETED   Durvalumab                  Shared decision.  I discussed diagnosis, prognosis and treatment option with patient and family.  I had detailed discussion with patient regarding chemotherapy and radiation.  I also discussed brain scan to rule out metastatic disease before starting treatment.  I counseled patient regarding chemotherapy side effects and their management.  Patient understand and want to proceed with treatment.      2. Neutropenia:  Due to chemotherapy.   Resolved.    3. CINV    Zofran PRN    4. Transaminitis:   resolved.    5. Thrombocytopenia:  Resolved   Resolved.   platelets today 219    6. Pain   Per ADeatra CanterHydrocodone for pain management         Return in about 2 weeks (around 11/14/2018) for cbc/diff, CMP, Treatment.        SVanetta Mulders MD  Hematology/Oncology

## 2018-10-31 NOTE — Nurses Notes (Addendum)
1121: Right chest port flushed with 10 ml ns, +br. Zofran given iv push over 2 minutes, flushed after with 10 ml ns.  1123: Imfinizi started to infuse over 1 hour. Caprice Renshaw, RN  [place] finished infusing. Flushed port with 10 cc of NS, + BR, then flushed with 5 cc of heparin. Deaccess port, huber intact covered site with Band-Aid. Patient denied any complaints and left ambulatory. Mirella Gueye, RN

## 2018-11-03 ENCOUNTER — Other Ambulatory Visit (HOSPITAL_COMMUNITY): Payer: Self-pay | Admitting: Internal Medicine

## 2018-11-03 MED ORDER — OXYCODONE 5 MG TABLET
5.00 mg | ORAL_TABLET | Freq: Three times a day (TID) | ORAL | 0 refills | Status: DC | PRN
Start: 2018-11-03 — End: 2018-12-16

## 2018-11-03 MED ORDER — PROCHLORPERAZINE MALEATE 10 MG TABLET
10.00 mg | ORAL_TABLET | Freq: Four times a day (QID) | ORAL | 5 refills | Status: DC | PRN
Start: 2018-11-03 — End: 2018-12-16

## 2018-11-14 ENCOUNTER — Ambulatory Visit (HOSPITAL_BASED_OUTPATIENT_CLINIC_OR_DEPARTMENT_OTHER): Payer: Medicare Other | Admitting: Internal Medicine

## 2018-11-14 ENCOUNTER — Inpatient Hospital Stay
Admission: RE | Admit: 2018-11-14 | Discharge: 2018-11-14 | Disposition: A | Discharge status: Still In Hospital | Payer: Medicare Other | Source: Ambulatory Visit | Attending: Internal Medicine | Admitting: Internal Medicine

## 2018-11-14 ENCOUNTER — Other Ambulatory Visit: Payer: Self-pay

## 2018-11-14 ENCOUNTER — Encounter (HOSPITAL_COMMUNITY): Payer: Self-pay | Admitting: Internal Medicine

## 2018-11-14 ENCOUNTER — Ambulatory Visit (HOSPITAL_COMMUNITY)
Admission: RE | Admit: 2018-11-14 | Discharge: 2018-11-14 | Disposition: A | Discharge status: Home / Self Care | Payer: Medicare Other | Source: Ambulatory Visit

## 2018-11-14 VITALS — BP 151/77 | HR 81 | Temp 98.5°F | Resp 18

## 2018-11-14 VITALS — BP 151/77 | HR 81 | Temp 98.5°F | Resp 18 | Ht 62.0 in | Wt 286.0 lb

## 2018-11-14 DIAGNOSIS — E669 Obesity, unspecified: Secondary | ICD-10-CM | POA: Insufficient documentation

## 2018-11-14 DIAGNOSIS — C3431 Malignant neoplasm of lower lobe, right bronchus or lung: Secondary | ICD-10-CM | POA: Insufficient documentation

## 2018-11-14 DIAGNOSIS — Z5111 Encounter for antineoplastic chemotherapy: Secondary | ICD-10-CM

## 2018-11-14 DIAGNOSIS — I10 Essential (primary) hypertension: Secondary | ICD-10-CM | POA: Insufficient documentation

## 2018-11-14 DIAGNOSIS — J449 Chronic obstructive pulmonary disease, unspecified: Secondary | ICD-10-CM | POA: Insufficient documentation

## 2018-11-14 DIAGNOSIS — Z87891 Personal history of nicotine dependence: Secondary | ICD-10-CM

## 2018-11-14 DIAGNOSIS — T451X5A Adverse effect of antineoplastic and immunosuppressive drugs, initial encounter: Secondary | ICD-10-CM

## 2018-11-14 DIAGNOSIS — Z90710 Acquired absence of both cervix and uterus: Secondary | ICD-10-CM | POA: Insufficient documentation

## 2018-11-14 DIAGNOSIS — Z5112 Encounter for antineoplastic immunotherapy: Secondary | ICD-10-CM | POA: Insufficient documentation

## 2018-11-14 DIAGNOSIS — R112 Nausea with vomiting, unspecified: Secondary | ICD-10-CM | POA: Insufficient documentation

## 2018-11-14 DIAGNOSIS — C7801 Secondary malignant neoplasm of right lung: Secondary | ICD-10-CM | POA: Insufficient documentation

## 2018-11-14 DIAGNOSIS — Z08 Encounter for follow-up examination after completed treatment for malignant neoplasm: Secondary | ICD-10-CM | POA: Insufficient documentation

## 2018-11-14 DIAGNOSIS — Z9884 Bariatric surgery status: Secondary | ICD-10-CM | POA: Insufficient documentation

## 2018-11-14 DIAGNOSIS — Z79899 Other long term (current) drug therapy: Secondary | ICD-10-CM | POA: Insufficient documentation

## 2018-11-14 DIAGNOSIS — C781 Secondary malignant neoplasm of mediastinum: Secondary | ICD-10-CM | POA: Insufficient documentation

## 2018-11-14 DIAGNOSIS — D701 Agranulocytosis secondary to cancer chemotherapy: Secondary | ICD-10-CM | POA: Insufficient documentation

## 2018-11-14 DIAGNOSIS — K760 Fatty (change of) liver, not elsewhere classified: Secondary | ICD-10-CM | POA: Insufficient documentation

## 2018-11-14 DIAGNOSIS — Z85038 Personal history of other malignant neoplasm of large intestine: Secondary | ICD-10-CM | POA: Insufficient documentation

## 2018-11-14 DIAGNOSIS — E785 Hyperlipidemia, unspecified: Secondary | ICD-10-CM | POA: Insufficient documentation

## 2018-11-14 DIAGNOSIS — Z7901 Long term (current) use of anticoagulants: Secondary | ICD-10-CM | POA: Insufficient documentation

## 2018-11-14 DIAGNOSIS — C3490 Malignant neoplasm of unspecified part of unspecified bronchus or lung: Secondary | ICD-10-CM

## 2018-11-14 DIAGNOSIS — G4730 Sleep apnea, unspecified: Secondary | ICD-10-CM | POA: Insufficient documentation

## 2018-11-14 DIAGNOSIS — R160 Hepatomegaly, not elsewhere classified: Secondary | ICD-10-CM | POA: Insufficient documentation

## 2018-11-14 DIAGNOSIS — Z6841 Body Mass Index (BMI) 40.0 and over, adult: Secondary | ICD-10-CM | POA: Insufficient documentation

## 2018-11-14 LAB — COMPREHENSIVE METABOLIC PANEL, NON-FASTING
ALBUMIN: 3.6 g/dL (ref 3.2–4.6)
ALKALINE PHOSPHATASE: 83 U/L (ref 20–130)
ALT (SGPT): 21 U/L (ref <=52)
ANION GAP: 7 mmol/L
AST (SGOT): 26 U/L (ref <=35)
BILIRUBIN TOTAL: 0.7 mg/dL (ref 0.3–1.2)
BUN/CREA RATIO: 14
BUN: 13 mg/dL (ref 10–25)
CALCIUM: 8.5 mg/dL — ABNORMAL LOW (ref 8.8–10.3)
CHLORIDE: 105 mmol/L (ref 98–111)
CO2 TOTAL: 27 mmol/L (ref 21–35)
CREATININE: 0.93 mg/dL (ref <=1.30)
ESTIMATED GFR: >60 mL/min/1.73mˆ2
GLUCOSE: 121 mg/dL — ABNORMAL HIGH (ref 70–110)
POTASSIUM: 3.3 mmol/L — ABNORMAL LOW (ref 3.5–5.0)
PROTEIN TOTAL: 6.2 g/dL (ref 6.0–8.3)
SODIUM: 139 mmol/L (ref 135–145)

## 2018-11-14 LAB — CBC WITH DIFF
BASOPHIL #: 0 x10ˆ3/uL (ref 0.00–0.20)
BASOPHIL %: 0 %
EOSINOPHIL #: 0.1 10*3/uL (ref 0.00–0.50)
EOSINOPHIL #: 0.1 10*3/uL (ref 0.00–0.50)
EOSINOPHIL %: 2 %
HCT: 36 % (ref 34.6–46.2)
HGB: 11.8 g/dL (ref 11.8–15.8)
LYMPHOCYTE #: 0.7 x10ˆ3/uL — ABNORMAL LOW (ref 0.90–3.40)
LYMPHOCYTE %: 9 %
MCH: 28.8 pg (ref 27.6–33.2)
MCHC: 32.8 g/dL (ref 32.6–35.4)
MCV: 87.9 fL (ref 82.3–96.7)
MONOCYTE #: 0.9 x10ˆ3/uL (ref 0.20–0.90)
MONOCYTE %: 13 %
MPV: 7.6 fL (ref 6.6–10.2)
NEUTROPHIL #: 5.6 x10ˆ3/uL (ref 1.50–6.40)
NEUTROPHIL %: 76 %
PLATELETS: 225 x10ˆ3/uL (ref 140–440)
RBC: 4.1 10*6/uL (ref 3.80–5.24)
RDW: 14.1 % (ref 12.4–15.2)
WBC: 7.4 x10ˆ3/uL (ref 3.5–10.3)

## 2018-11-14 MED ORDER — SODIUM CHLORIDE 0.9 % INTRAVENOUS SOLUTION
10.0000 mg/kg | Freq: Once | INTRAVENOUS | Status: AC
Start: 2018-11-14 — End: 2018-11-14
  Administered 2018-11-14: 0 mg via INTRAVENOUS
  Administered 2018-11-14: 1250 mg via INTRAVENOUS
  Filled 2018-11-14: qty 25

## 2018-11-14 MED ORDER — POTASSIUM CHLORIDE ER 20 MEQ TABLET,EXTENDED RELEASE(PART/CRYST)
20.00 meq | ORAL_TABLET | ORAL | Status: AC
Start: 2018-11-14 — End: 2018-11-14
  Administered 2018-11-14: 20 meq via ORAL
  Filled 2018-11-14: qty 1

## 2018-11-14 NOTE — Nurses Notes (Addendum)
1046- potassium given po. Pt tol well, no complaints. Lilyan Gilford, RN   [place] flushed with 10 cc NS and +br. Imfinzi infusion started over 1 hour. [name], RN  1150-  Imfinzi infusion complete without incidence. Port flushed with 10 cc NS and + br. Heparin flush per protocol. Port was deaccessed with needle intact. Bandage applied to site. Pt escorted by RN in wheelchair to family member. Pt is aware of upcoming appointment times. [name], RN

## 2018-11-14 NOTE — Progress Notes (Addendum)
[place]  [place] [address]        Encounter Date: 11/14/2018   9:45 AM EDT    Name:  Kelsey [name]  Age: 68 y.o.  DOB: [birth year]  Sex: female  PCP: [facility]    Chief Complaint:    Chief Complaint   Patient presents with   . Cancer     HISTORY OF PRESENT ILLNESS:   68 y.o. female with history of stage III colon cancer of present evaluation and management of lung cancer.    1. September 2015. Patient underwent sigmoidoscopy and was found to have polyp which was adenocarcinoma.    2. June 10, 2014. She was admitted for segmental resection of sigmoid colon. This revealed pT3, N1a, MX, low-grade  adenocarcinoma of the colon. The mass measuring 4 x 3 x 0.5 centimeters. It  was low-grade and there was no evidence of microsatellite instability by  histology. Margins were uninvolved and 4 lymph nodes only were removed which  only 1 was involved. This corresponding stage IIIB, T3, N1a, M0, low-grade  sigmoid cancer.     3. July 07, 2014. Recommended adjuvant chemotherapy with FOLFOX.    4. July 27, 2014. Patient was started on adjuvant chemotherapy with FOLFOX.  Patient received only 5 treatments and chemotherapy was stopped due to poor tolerance.  After that patient lost follow-up.      5. October 2019. Patient was admitted to hospital with concern for  bowel obstruction.  CT chest showed lung nodules.  Patient was managed conservatively and discharged home.    6. May 06, 2018.  Patient underwent CT-guided biopsy of lung lesion.  Pathology showed well-differentiated adenocarcinoma.  Section shows well-differentiated adenocarcinoma which positive for CK 7 and TTF 1, negative for P 63 and CK 20.     7. June 23, 2018. PET scan showed Hypermetabolic right lower lobe pulmonary mass compatible with malignancy.  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 Hepatomegaly and  steatosis.     8. July 23, 2018. Patient was started on chemoradiation.    9. October 30, 2018. PET scan showed Hypermetabolic right lower lobe mass with abnormal mediastinal and hilar nodes. There has been only mild improvement since earlier exam.    10. October 31, 2018. Patient was started on maintenance immunotherapy with durvalumab.    SUBJECTIVE:  Patient here for follow-up and treatment.  Denies any new issues since last visit.  Denies any worsening shortness of breath or chest pain.  Tolerating treatment well.    REVIEW OF SYSTEMS:  GENERAL:   Denies fever or chills.    HEENT: no sore throat, congestion, blurry vision  Lungs:  No shortness of breath, reports cough and congestion.  GI: no nausea, vomiting, constipation, no diarrhea  GU: No dysuria, urgency, increased frequency   Cardiac: no chest pains, palpitations, syncope,   Neuro: no weakness, loss of function, numbness, tingling  Skin: no rash  Musculoskeletal: No myalgias  Psychosocial: No depression, anxiety  Hematologic: No easy bruising, abnormal bleeding  All other review ROS negative except those mentioned above.     PATIENT HISTORY:  Past Medical History:   Diagnosis Date   . A-fib (CMS HCC)    .  Arthropathy, unspecified, site unspecified    . Asthma    . Cancer (CMS HCC)     cervical   . Cataract    . Colon cancer (CMS [facility]) 07/06/2014   . COPD (chronic obstructive pulmonary disease) (CMS HCC)    . Fistula    . HTN (hypertension)    . Hyperlipidemia    . Obesity    . Sleep apnea    . Ventral hernia        Past Surgical History:   Procedure Laterality Date   . ABCESS DRAINAGE     . BOWEL RESECTION     . CATARACT EXTRACTION     . HX CERVICAL CONE BIOPSY      . HX CESAREAN SECTION     . HX GASTRIC BYPASS      25 years ago   . [place] (x2)   . HX HYSTERECTOMY      late 1990s   . HX LAP BANDING      2013   . HX LAPAROTOMY      2013 to remove lap band       Family Medical History:     Problem Relation (Age of Onset)    Diabetes  Mother, Maternal Grandmother, Maternal Grandfather, Father    Heart Attack Mother    Stroke Father          Current Outpatient Medications   Medication Sig   . albuterol sulfate (PROVENTIL) 2.5 mg /3 mL (0.083 %) Inhalation Solution for Nebulization 3 mL (2.5 mg total) by Nebulization route Every 6 hours   . amiodarone (PACERONE) 200 mg Oral Tablet Take 1 Tab (200 mg total) by mouth Twice daily for 14 days   . amLODIPine (NORVASC) 5 mg Oral Tablet Take 1 Tab (5 mg total) by mouth Once a day for 30 days   . apixaban (ELIQUIS) 5 mg Oral Tablet Take 1 Tab (5 mg total) by mouth Twice daily   . atorvastatin (LIPITOR) 10 mg Oral Tablet Take 10 mg by mouth Once a day   . budesonide-formoterol (SYMBICORT) 160-4.5 mcg/actuation Inhalation HFA Aerosol Inhaler Take 2 Puffs by inhalation Twice daily   . docusate sodium (COLACE) 100 mg Oral Capsule Take 100 mg by mouth Three times a day as needed    . DULoxetine (CYMBALTA DR) 30 mg Oral Capsule, Delayed Release(E.C.) Take 30 mg by mouth Once a day   . linaCLOtide (LINZESS) 72 mcg Oral Capsule Take 72 mcg by mouth Every morning   . loperamide (IMODIUM) 2 mg Oral Capsule Take 2 mg by mouth Every 4 hours as needed   . loratadine (CLARITIN) 10 mg Oral Tablet Take 1 Tab (10 mg total) by mouth Once a day   . magnesium oxide (MAG-OX) 400 mg Oral Tablet Take 400 mg by mouth Once a day   . omeprazole (PRILOSEC) 20 mg Oral Capsule, Delayed Release(E.C.) Take 40 mg by mouth Once a day    . oxyCODONE (ROXICODONE) 5 mg Oral Tablet Take 1 Tab (5 mg total) by mouth Every 8 hours as needed for Pain   . potassium chloride (K-DUR) 20 mEq Oral Tab Sust.Rel. Particle/Crystal Take 20 mEq by mouth Once a day   . prochlorperazine (COMPAZINE) 10 mg Oral Tablet Take 1 Tab (10 mg total) by mouth Four times a day as needed for Nausea/Vomiting   . rOPINIRole (REQUIP) 0.5 mg Oral Tablet Take 0.5 mg by mouth Every night  Social History     Socioeconomic History   . Marital status: Married     Spouse name:  Not on file   . Number of children: Not on file   . Years of education: Not on file   . Highest education level: Not on file   Tobacco Use   . Smoking status: Former Research scientist (life sciences)   . Smokeless tobacco: Never Used   . Tobacco comment: quit 10 yrs ago   Substance and Sexual Activity   . Alcohol use: Not Currently     Comment: rarely   . Drug use: No   Other Topics Concern   . Ability to Walk 1 Flight of Steps without SOB/CP No   . Ability To Do Own ADL's Yes       PHYSICAL EXAMINATION:  VITALS:   Vitals:    11/14/18 0952   BP: (!) 151/77   Pulse: 81   Resp: 18   Temp: 36.9 C (98.5 F)   TempSrc: Temporal   SpO2: 98%   Weight: 129.7 kg (286 lb)   Height: 1.575 m ('5\' 2"'$ )   BMI: 52.42       PHYSICAL EXAM:   Consitutional: No acute distress.    Eyes: EOMI. No discharge. No Jaundice.   ENT: mucous membranes dry. No posterior pharynx lesions.. Neck supple, No palpable masses   Heme/ Lymph: No Cervical, Inguinal, axillary lymh nodes. No Bruising.   Cardiovascular: S1, S2, No murmurs, rubs, or gallops.   Respiratory: Clear to auscultate and percuss B/L, No rhonchi, crackles, or wheezing.   Abdomen: Normal Bowel Sounds, nontender nondistended. No hepatosplenomegaly.   Musculoskeletal: No Edema to the extremities.   Skin: Normal turgor. No Rashes,skin lesions   Psychiatry: Normal Affect   Neuro: No focal deficits. Alert and Oriented x 3      ENCOUNTER ORDERS:  No orders of the defined types were placed in this encounter.      LABORATORY/RADIOLOGICAL DATA: All pertinent labs/radiology were reviewed.   Results for CHRISANN, MELARAGNO (MRN [id number]) as of 11/14/2018 10:19   Ref. Range 11/14/2018 09:53   WBC Latest Ref Range: 3.5 - 10.3 x10^3/uL 7.4   HGB Latest Ref Range: 11.8 - 15.8 g/dL 11.8   HCT Latest Ref Range: 34.6 - 46.2 % 36.0   PLATELET COUNT Latest Ref Range: 140 - 440 x10^3/uL 225   RBC Latest Ref Range: 3.80 - 5.24 x10^6/uL 4.10   MCV Latest Ref Range: 82.3 - 96.7 fL 87.9   MCHC Latest Ref Range: 32.6 - 35.4 g/dL 32.8   MCH  Latest Ref Range: 27.6 - 33.2 pg 28.8   RDW Latest Ref Range: 12.4 - 15.2 % 14.1   MPV Latest Ref Range: 6.6 - 10.2 fL 7.6   PMN'S Latest Units: % 76   LYMPHOCYTES Latest Units: % 9   EOSINOPHIL Latest Units: % 2   MONOCYTES Latest Units: % 13   BASOPHILS Latest Units: % 0   PMN ABS Latest Ref Range: 1.50 - 6.40 x10^3/uL 5.60   LYMPHS ABS Latest Ref Range: 0.90 - 3.40 x10^3/uL 0.70 (L)   EOS ABS Latest Ref Range: 0.00 - 0.50 x10^3/uL 0.10   MONOS ABS Latest Ref Range: 0.20 - 0.90 x10^3/uL 0.90   BASOS ABS Latest Ref Range: 0.00 - 0.20 x10^3/uL 0.00   SODIUM Latest Ref Range: 135 - 145 mmol/L 139   POTASSIUM Latest Ref Range: 3.5 - 5.0 mmol/L 3.3 (L)   CHLORIDE Latest Ref Range: 98 - 111 mmol/L  105   CARBON DIOXIDE Latest Ref Range: 21 - 35 mmol/L 27   BUN Latest Ref Range: 10 - 25 mg/dL 13   CREATININE Latest Ref Range: <=1.30 mg/dL 0.93   GLUCOSE Latest Ref Range: 70 - 110 mg/dL 121 (H)   ANION GAP Latest Units: mmol/L 7   BUN/CREAT RATIO Unknown 14   ESTIMATED GLOMERULAR FILTRATION RATE Latest Ref Range: Avg: 85 mL/min/1.36m2 >60   CALCIUM Latest Ref Range: 8.8 - 10.3 mg/dL 8.5 (L)   TOTAL PROTEIN Latest Ref Range: 6.0 - 8.3 g/dL 6.2   ALBUMIN Latest Ref Range: 3.2 - 4.6 g/dL 3.6   BILIRUBIN, TOTAL Latest Ref Range: 0.3 - 1.2 mg/dL 0.7   AST (SGOT) Latest Ref Range: <=35 U/L 26   ALT (SGPT) Latest Ref Range: <=52 U/L 21   ALKALINE PHOSPHATASE Latest Ref Range: 20 - 130 U/L 83         ASSESSMENT AND PLAN:  68y.o. female with history of stage III colon cancer status post 5 cycle of FOLFOX now presenting with stage III lung cancer.  I explained the reason for referred to Hematology/Oncology Clinic.    ICD-10-CM    1. Malignant neoplasm of lung, unspecified laterality, unspecified part of lung (CMS HCC) C34.90    2. Encounter for antineoplastic chemotherapy Z51.11        1. Adenocarcinoma lung:  Stage III.    CT brain to rule out metastatic disease.  NEGATIVE.   Started on chemoradiation carboplatin/Taxol along  with radiation- July 23, 2018.  After completion of chemo radiation patient will get PET scan.  If continued to have good response will start maintenance immunotherapy for 1 year.   PET ordered for 4 week post radiation therapy Scheduled for 10/30/2018   Completed 6/6 cycles of Taxol/Carboplatin and will start maintenance immunotherapy therapy following radiation therapy and PET scan    CHEMOTHERAPY:/RADIATION  CYCLE 2/26 DAY   DRUG DOSE TOTAL DOSE % ROUTE SCHEDULE   Carboplatin AUC- 2 205 mg    COMPLETED   Paclitaxel 45 mg/m2 108 mg    COMPLETED   Durvalumab                  Shared decision.  I discussed diagnosis, prognosis and treatment option with patient and family.  I had detailed discussion with patient regarding chemotherapy and radiation.  I also discussed brain scan to rule out metastatic disease before starting treatment.  I counseled patient regarding chemotherapy side effects and their management.  Patient understand and want to proceed with treatment.      2. Neutropenia:  Due to chemotherapy.   Resolved.    3. CINV    Zofran PRN    4. Transaminitis:   resolved.    5. Thrombocytopenia:  Resolved   Resolved.   platelets today 225            Return in about 2 weeks (around 11/28/2018) for cbc/diff, CMP, Treatment.        SVanetta Mulders MD  Hematology/Oncology

## 2018-11-21 ENCOUNTER — Encounter (INDEPENDENT_AMBULATORY_CARE_PROVIDER_SITE_OTHER): Payer: Self-pay | Admitting: Cardiovascular Disease

## 2018-11-25 NOTE — Care Management Notes (Signed)
Referral Information  ++++++ Placed Provider #1 ++++++  Case Manager: Elijah Birk  Provider Type: DME  Provider Name: Lenis Noon. - [facility]  Address:  [address] [place] [address]  Contact: Ashley Royalty    Phone: [phone] x  Fax:   Fax: [phone]

## 2018-11-28 ENCOUNTER — Encounter (INDEPENDENT_AMBULATORY_CARE_PROVIDER_SITE_OTHER): Payer: Self-pay | Admitting: Cardiovascular Disease

## 2018-12-01 ENCOUNTER — Inpatient Hospital Stay (HOSPITAL_COMMUNITY): Payer: Medicare Other

## 2018-12-01 ENCOUNTER — Encounter (HOSPITAL_COMMUNITY): Payer: Self-pay | Admitting: Internal Medicine

## 2018-12-08 ENCOUNTER — Encounter (HOSPITAL_COMMUNITY): Payer: Self-pay | Admitting: Internal Medicine

## 2018-12-08 ENCOUNTER — Inpatient Hospital Stay (HOSPITAL_COMMUNITY): Payer: Medicare Other

## 2018-12-16 ENCOUNTER — Inpatient Hospital Stay (INDEPENDENT_AMBULATORY_CARE_PROVIDER_SITE_OTHER)
Admission: RE | Admit: 2018-12-16 | Discharge: 2018-12-16 | Disposition: A | Discharge status: Still In Hospital | Payer: Medicare Other | Source: Ambulatory Visit

## 2018-12-16 ENCOUNTER — Other Ambulatory Visit (HOSPITAL_COMMUNITY): Payer: Self-pay | Admitting: Nurse Practitioner

## 2018-12-16 ENCOUNTER — Ambulatory Visit (HOSPITAL_BASED_OUTPATIENT_CLINIC_OR_DEPARTMENT_OTHER): Payer: Medicare Other | Admitting: Nurse Practitioner

## 2018-12-16 ENCOUNTER — Other Ambulatory Visit: Payer: Self-pay

## 2018-12-16 ENCOUNTER — Encounter (INDEPENDENT_AMBULATORY_CARE_PROVIDER_SITE_OTHER): Payer: Self-pay | Admitting: Nurse Practitioner

## 2018-12-16 VITALS — BP 96/20 | HR 74 | Temp 97.9°F | Resp 16 | Ht 62.0 in

## 2018-12-16 DIAGNOSIS — R0600 Dyspnea, unspecified: Secondary | ICD-10-CM

## 2018-12-16 DIAGNOSIS — R05 Cough: Secondary | ICD-10-CM

## 2018-12-16 DIAGNOSIS — I10 Essential (primary) hypertension: Secondary | ICD-10-CM | POA: Insufficient documentation

## 2018-12-16 DIAGNOSIS — C3490 Malignant neoplasm of unspecified part of unspecified bronchus or lung: Secondary | ICD-10-CM | POA: Insufficient documentation

## 2018-12-16 DIAGNOSIS — Z87891 Personal history of nicotine dependence: Secondary | ICD-10-CM | POA: Insufficient documentation

## 2018-12-16 DIAGNOSIS — T451X5A Adverse effect of antineoplastic and immunosuppressive drugs, initial encounter: Secondary | ICD-10-CM

## 2018-12-16 DIAGNOSIS — R112 Nausea with vomiting, unspecified: Secondary | ICD-10-CM | POA: Insufficient documentation

## 2018-12-16 DIAGNOSIS — K760 Fatty (change of) liver, not elsewhere classified: Secondary | ICD-10-CM | POA: Insufficient documentation

## 2018-12-16 DIAGNOSIS — Z5111 Encounter for antineoplastic chemotherapy: Secondary | ICD-10-CM

## 2018-12-16 DIAGNOSIS — Z7951 Long term (current) use of inhaled steroids: Secondary | ICD-10-CM | POA: Insufficient documentation

## 2018-12-16 DIAGNOSIS — C7801 Secondary malignant neoplasm of right lung: Secondary | ICD-10-CM | POA: Insufficient documentation

## 2018-12-16 DIAGNOSIS — R509 Fever, unspecified: Secondary | ICD-10-CM

## 2018-12-16 DIAGNOSIS — C781 Secondary malignant neoplasm of mediastinum: Secondary | ICD-10-CM | POA: Insufficient documentation

## 2018-12-16 DIAGNOSIS — R160 Hepatomegaly, not elsewhere classified: Secondary | ICD-10-CM | POA: Insufficient documentation

## 2018-12-16 DIAGNOSIS — Z7901 Long term (current) use of anticoagulants: Secondary | ICD-10-CM | POA: Insufficient documentation

## 2018-12-16 DIAGNOSIS — E876 Hypokalemia: Secondary | ICD-10-CM | POA: Insufficient documentation

## 2018-12-16 DIAGNOSIS — Z5112 Encounter for antineoplastic immunotherapy: Principal | ICD-10-CM | POA: Insufficient documentation

## 2018-12-16 DIAGNOSIS — Z9884 Bariatric surgery status: Secondary | ICD-10-CM | POA: Insufficient documentation

## 2018-12-16 DIAGNOSIS — R7401 Elevation of levels of liver transaminase levels: Secondary | ICD-10-CM

## 2018-12-16 DIAGNOSIS — E785 Hyperlipidemia, unspecified: Secondary | ICD-10-CM | POA: Insufficient documentation

## 2018-12-16 DIAGNOSIS — R059 Cough, unspecified: Secondary | ICD-10-CM

## 2018-12-16 DIAGNOSIS — J449 Chronic obstructive pulmonary disease, unspecified: Secondary | ICD-10-CM | POA: Insufficient documentation

## 2018-12-16 DIAGNOSIS — D701 Agranulocytosis secondary to cancer chemotherapy: Secondary | ICD-10-CM | POA: Insufficient documentation

## 2018-12-16 DIAGNOSIS — E669 Obesity, unspecified: Secondary | ICD-10-CM | POA: Insufficient documentation

## 2018-12-16 DIAGNOSIS — Z79899 Other long term (current) drug therapy: Secondary | ICD-10-CM | POA: Insufficient documentation

## 2018-12-16 DIAGNOSIS — I4891 Unspecified atrial fibrillation: Secondary | ICD-10-CM | POA: Insufficient documentation

## 2018-12-16 DIAGNOSIS — G4730 Sleep apnea, unspecified: Secondary | ICD-10-CM | POA: Insufficient documentation

## 2018-12-16 DIAGNOSIS — C3431 Malignant neoplasm of lower lobe, right bronchus or lung: Secondary | ICD-10-CM | POA: Insufficient documentation

## 2018-12-16 DIAGNOSIS — Z85038 Personal history of other malignant neoplasm of large intestine: Secondary | ICD-10-CM | POA: Insufficient documentation

## 2018-12-16 DIAGNOSIS — R740 Nonspecific elevation of levels of transaminase and lactic acid dehydrogenase [LDH]: Secondary | ICD-10-CM

## 2018-12-16 LAB — PERFORM POC COMPLETE BLOOD COUNT W/AUTO DIFF
HCT: 0
HGB: 0
LYMPHOCYTES: 0
LYMPHS ABS: 0
MCH: 0
MCHC: 0
MCV: 0
MIXED CELL ABSOLUTE (MXD#): 0
MIXED CELL PERCENT (MXD%): 0 %
MPV: 0
PLATELET COUNT: 0
PMN ABS: 0
PMN'S: 0
RBC: 0
RDW: 0
WBC: 0

## 2018-12-16 LAB — PERFORM POC COMPREHENSIVE METABOLIC PANEL (NON-FASTING)
ALBUMIN: 0
ALKALINE PHOSPHATASE: 0
ALT (SGPT): 0
ANION GAP: 0
AST (SGOT): 0
BILIRUBIN, TOTAL: 0
BUN/CREAT RATIO: 0
BUN: 0
CALCIUM: 0
CARBON DIOXIDE: 0
CARBON DIOXIDE: 0
CHLORIDE: 0
CREATININE: 0
ESTIMATED GLOMERULAR FILTRATION RATE: 0
GLUCOSE,NONFAST: 0
POTASSIUM: 0
SODIUM: 0
TOTAL PROTEIN: 0

## 2018-12-16 MED ORDER — PROCHLORPERAZINE MALEATE 10 MG TABLET
10.00 mg | ORAL_TABLET | Freq: Four times a day (QID) | ORAL | 5 refills | Status: AC | PRN
Start: 2018-12-16 — End: ?

## 2018-12-16 MED ORDER — OXYCODONE 5 MG TABLET
5.00 mg | ORAL_TABLET | Freq: Three times a day (TID) | ORAL | 0 refills | Status: DC | PRN
Start: 2018-12-16 — End: 2019-01-21

## 2018-12-16 NOTE — Progress Notes (Signed)
[place]  [place] [address]        Encounter Date: 12/16/2018  10:30 AM EDT    Name:  Kelsey [name]  Age: 68 y.o.  DOB: Oct 03, 1950  Sex: female  PCP: Benjie Karvonen    Chief Complaint:    No chief complaint on file.    HISTORY OF PRESENT ILLNESS:   68 y.o. female with history of stage III colon cancer of present evaluation and management of lung cancer.    1. September 2015. Patient underwent sigmoidoscopy and was found to have polyp which was adenocarcinoma.    2. June 10, 2014. She was admitted for segmental resection of sigmoid colon. This revealed pT3, N1a, MX, low-grade  adenocarcinoma of the colon. The mass measuring 4 x 3 x 0.5 centimeters. It  was low-grade and there was no evidence of microsatellite instability by  histology. Margins were uninvolved and 4 lymph nodes only were removed which  only 1 was involved. This corresponding stage IIIB, T3, N1a, M0, low-grade  sigmoid cancer.     3. July 07, 2014. Recommended adjuvant chemotherapy with FOLFOX.    4. July 27, 2014. Patient was started on adjuvant chemotherapy with FOLFOX.  Patient received only 5 treatments and chemotherapy was stopped due to poor tolerance.  After that patient lost follow-up.      5. October 2019. Patient was admitted to hospital with concern for  bowel obstruction.  CT chest showed lung nodules.  Patient was managed conservatively and discharged home.    6. May 06, 2018.  Patient underwent CT-guided biopsy of lung lesion.  Pathology showed well-differentiated adenocarcinoma.  Section shows well-differentiated adenocarcinoma which positive for CK 7 and TTF 1, negative for P 63 and CK 20.     7. June 23, 2018. PET scan showed Hypermetabolic right lower lobe pulmonary mass compatible with malignancy.  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 Hepatomegaly and steatosis.     8. July 23, 2018. Patient was started on chemoradiation.    9. October 30, 2018. PET scan showed Hypermetabolic right lower lobe mass with abnormal mediastinal and hilar nodes. There has been only mild improvement since earlier exam.    10. October 31, 2018. Patient was started on maintenance immunotherapy with durvalumab.    SUBJECTIVE:  Patient presents for follow-up and treatment. Patient reports she has been dyspneic and reports it is getting worse. Patient also reports low-grade fevers. Patient also reports diarrhea but notes it is only 1-2 times per day and she is taking immodium. Patient reports she is tolerating therapy well, but feels like it is taking her a long time to recover from radiation and chemotherapy as she feels she is very deconditioned.      REVIEW OF SYSTEMS:  GENERAL:   Reports fever and chills   HEENT: no sore throat, congestion, blurry vision  Lungs:  No shortness of breath, reports cough and congestion and dyspnea.  GI: no nausea, vomiting, constipation, no diarrhea  GU: No dysuria, urgency, increased frequency   Cardiac: no chest pains, palpitations, syncope,   Neuro: no weakness, loss of function, numbness, tingling  Skin: no rash  Musculoskeletal: No myalgias  Psychosocial: No depression, anxiety  Hematologic: No easy bruising, abnormal bleeding  All other review ROS negative except those mentioned above.     PATIENT HISTORY:  Past Medical History:   Diagnosis Date   . A-fib (CMS HCC)    . Arthropathy, unspecified, site unspecified    . Asthma    . Cancer (CMS HCC)     cervical   . Cataract    . Colon cancer (CMS [place]) 07/06/2014   . COPD (chronic obstructive pulmonary disease) (CMS HCC)    . Fistula    . HTN (hypertension)    . Hyperlipidemia    . Obesity    . Sleep apnea    . Ventral hernia        Past Surgical History:   Procedure Laterality Date   . ABSCESS DRAINAGE     . BOWEL RESECTION     . CATARACT EXTRACTION     . HX CERVICAL CONE BIOPSY      . HX CESAREAN SECTION     . HX GASTRIC BYPASS        25 years ago   . [place] (x2)   . HX HYSTERECTOMY      late 1990s   . HX LAP BANDING      2013   . HX LAPAROTOMY      2013 to remove lap band       Family Medical History:     Problem Relation (Age of Onset)    Diabetes Mother, Maternal Grandmother, Maternal Grandfather, Father    Heart Attack Mother    Stroke Father          Current Outpatient Medications   Medication Sig   . albuterol sulfate (PROVENTIL) 2.5 mg /3 mL (0.083 %) Inhalation Solution for Nebulization 3 mL (2.5 mg total) by Nebulization route Every 6 hours   . apixaban (ELIQUIS) 5 mg Oral Tablet Take 1 Tab (5 mg total) by mouth Twice daily   . atorvastatin (LIPITOR) 10 mg Oral Tablet Take 10 mg by mouth Once a day   . budesonide-formoterol (SYMBICORT) 160-4.5 mcg/actuation Inhalation HFA Aerosol Inhaler Take 2 Puffs by inhalation Twice daily   . docusate sodium (COLACE) 100 mg Oral Capsule Take 100 mg by mouth Three times a day as needed    . flecainide (TAMBOCOR) 100 mg Oral Tablet Take 100 mg by mouth Twice daily   . linaCLOtide (LINZESS) 72 mcg Oral Capsule Take 72 mcg by mouth Every morning   . lisinopriL (PRINIVIL) 20 mg Oral Tablet Take 20 mg by mouth Once a day   . loperamide (IMODIUM) 2 mg Oral Capsule Take 2 mg by mouth Every 4 hours as needed   . loratadine (CLARITIN) 10 mg Oral Tablet Take 1 Tab (10 mg total) by mouth Once a day   . magnesium oxide (MAG-OX) 400 mg Oral Tablet Take 400 mg by mouth Once a day   . omeprazole (PRILOSEC) 20 mg Oral Capsule, Delayed Release(E.C.) Take 40 mg by mouth Once a day    . oxyCODONE (ROXICODONE) 5 mg Oral Tablet Take 1 Tab (5 mg total) by mouth Every 8 hours as needed for Pain   . potassium chloride (K-DUR) 20 mEq Oral Tab Sust.Rel. Particle/Crystal Take 20 mEq by mouth Once a day   . prochlorperazine (COMPAZINE) 10 mg Oral Tablet Take 1 Tab (10 mg total) by mouth Four times a day as needed for Nausea/Vomiting   . rOPINIRole (REQUIP) 0.5 mg  Oral Tablet Take 0.5 mg by mouth  Every night     Social History     Socioeconomic History   . Marital status: Married     Spouse name: Not on file   . Number of children: Not on file   . Years of education: Not on file   . Highest education level: Not on file   Tobacco Use   . Smoking status: Former Research scientist (life sciences)   . Smokeless tobacco: Never Used   . Tobacco comment: quit 10 yrs ago   Substance and Sexual Activity   . Alcohol use: Not Currently     Comment: rarely   . Drug use: No   Other Topics Concern   . Ability to Walk 1 Flight of Steps without SOB/CP No   . Ability To Do Own ADL's Yes       PHYSICAL EXAMINATION:  VITALS:   Vitals:    12/16/18 1027   BP: (!) 96/52   Pulse: 74   Resp: 16   Temp: 36.6 C (97.9 F)   TempSrc: Tympanic   SpO2: 94%   Height: 1.575 m (5' 2" )       PHYSICAL EXAM:   Consitutional: No acute distress.    Eyes: EOMI. No discharge. No Jaundice.   ENT: mucous membranes dry. No posterior pharynx lesions.. Neck supple, No palpable masses   Heme/ Lymph: No Cervical, Inguinal, axillary lymh nodes. No Bruising.   Cardiovascular: S1, S2, No murmurs, rubs, or gallops.   Respiratory: Wheezes in bilateral lower lung fields.   Abdomen: Normal Bowel Sounds, nontender nondistended. No hepatosplenomegaly.   Musculoskeletal: No Edema to the extremities.   Skin: Normal turgor. No Rashes,skin lesions   Psychiatry: Normal Affect   Neuro: No focal deficits. Alert and Oriented x 3      ENCOUNTER ORDERS:  Orders Placed This Encounter   . COVID-19 SCREENING - SEND-OUT       LABORATORY/RADIOLOGICAL DATA: All pertinent labs/radiology were reviewed.       ASSESSMENT AND PLAN:  68 y.o. female with history of stage III colon cancer status post 5 cycle of FOLFOX now presenting with stage III lung cancer.  I explained the reason for referred to Hematology/Oncology Clinic.    ICD-10-CM    1. Malignant neoplasm of lung, unspecified laterality, unspecified part of lung (CMS HCC) C34.90 COVID-19 SCREENING - SEND-OUT   2. Dyspnea, unspecified type R06.00 COVID-19  SCREENING - SEND-OUT   3. Transaminitis R74.0    4. CINV (chemotherapy-induced nausea and vomiting) R11.2     T45.1X5A    5. Fever, unspecified fever cause R50.9    6. Cough R05        1. Adenocarcinoma lung:  Stage III.    CT brain to rule out metastatic disease.  NEGATIVE.   Started on chemoradiation carboplatin/Taxol along with radiation- July 23, 2018.  After completion of chemo radiation patient will get PET scan.  If continued to have good response will start maintenance immunotherapy for 1 year.   PET ordered for 4 week post radiation therapy Scheduled for 10/30/2018   Completed 6/6 cycles of Taxol/Carboplatin and will start maintenance immunotherapy therapy following radiation therapy and PET scan    CHEMOTHERAPY:/RADIATION  CYCLE 2/26 DAY   DRUG DOSE TOTAL DOSE % ROUTE SCHEDULE   Carboplatin AUC- 2 205 mg    COMPLETED   Paclitaxel 45 mg/m2 108 mg    COMPLETED   Durvalumab  Shared decision.  I discussed diagnosis, prognosis and treatment option with patient and family.  I had detailed discussion with patient regarding chemotherapy and radiation.  I also discussed brain scan to rule out metastatic disease before starting treatment.  I counseled patient regarding chemotherapy side effects and their management.  Patient understand and want to proceed with treatment.      2. Neutropenia:  Due to chemotherapy.   Resolved.    3. CINV    Zofran PRN    4. Transaminitis:   resolved.    5. Thrombocytopenia:  Resolved   Resolved.   platelets today 225    6. Dyspnea/fevers   Sending to be COVID tested per Deatra Canter   If COVID negative, we will order a Chest CT and steroids to rule out pneumonitis   Instructed patient to report to ED if dyspnea gets worse        Return in about 1 week (around 12/23/2018) for cbc/diff, CMP, EXAM, Treatment, Deatra Canter!!!!!!!!!!!!!!!!!!!!!!!!!!!!!!!!!!!!!!!!!!!!.        Omer Jack, APRN, FNP-C      Addendum:      I saw and examined the patient.  I reviewed the NP s  note.  I discussed the findings, assessment and plan of care as documented in the NP note and agree with documentation above.  Any exceptions/additions are edited/noted.    Vanetta Mulders, MD  Hematology/Oncology

## 2018-12-16 NOTE — Nurses Notes (Signed)
1200: Patient not receiving treatment today per Dr. Deatra Canter. Tomasita Crumble, RN

## 2018-12-19 ENCOUNTER — Encounter (HOSPITAL_COMMUNITY): Payer: Self-pay | Admitting: Nurse Practitioner

## 2018-12-19 ENCOUNTER — Telehealth (HOSPITAL_COMMUNITY): Payer: Self-pay | Admitting: Nurse Practitioner

## 2018-12-19 ENCOUNTER — Ambulatory Visit
Admission: RE | Admit: 2018-12-19 | Discharge: 2018-12-19 | Disposition: A | Discharge status: Home / Self Care | Payer: Medicare Other | Source: Ambulatory Visit | Attending: Nurse Practitioner | Admitting: Nurse Practitioner

## 2018-12-19 ENCOUNTER — Other Ambulatory Visit: Payer: Self-pay

## 2018-12-19 DIAGNOSIS — R0600 Dyspnea, unspecified: Secondary | ICD-10-CM | POA: Insufficient documentation

## 2018-12-19 DIAGNOSIS — C3490 Malignant neoplasm of unspecified part of unspecified bronchus or lung: Principal | ICD-10-CM | POA: Insufficient documentation

## 2018-12-19 MED ORDER — PREDNISONE 20 MG TABLET
20.0000 mg | ORAL_TABLET | Freq: Every day | ORAL | 0 refills | Status: AC
Start: 2018-12-19 — End: 2018-12-24

## 2018-12-19 NOTE — Telephone Encounter (Signed)
Spoke with patient as COVID testing is negative.  Patient reports that her shortness of breath is stable and she has been taking nebulizer treatments at home which has significantly improved her breathing.  Instructed her to come to [facility] for a CT of her chest to rule out pneumonitis and initiate 40 mg of p.o. Steroids today for 5 days.  Instructed her to come to the emergency room if her shortness of breath worsens; patient verbalizes understanding and agrees to plan of care. Omer Jack, APRN, FNP-C

## 2018-12-19 NOTE — Telephone Encounter (Signed)
Spoke with patient with results per Dr. Deatra Canter to continue steroids and will see patient on Tuesday at [facility] for treatment. Omer Jack, APRN, FNP-C

## 2018-12-19 NOTE — Nursing Note (Signed)
STAT ct chest was scheduled for today.  Patient was at home at time and patient was called with information that test is today , prefer her to come in asap the morning and that she can leave once scan is done and that the doctor or NP will be calling her in regards to the test results.  Pt stated she would be here in about 1-2 hours as she lives a distance away .

## 2018-12-23 ENCOUNTER — Other Ambulatory Visit: Payer: Self-pay

## 2018-12-23 ENCOUNTER — Encounter (INDEPENDENT_AMBULATORY_CARE_PROVIDER_SITE_OTHER): Payer: Self-pay | Admitting: Nurse Practitioner

## 2018-12-23 ENCOUNTER — Ambulatory Visit (HOSPITAL_BASED_OUTPATIENT_CLINIC_OR_DEPARTMENT_OTHER): Payer: Medicare Other | Admitting: Nurse Practitioner

## 2018-12-23 ENCOUNTER — Inpatient Hospital Stay
Admission: RE | Admit: 2018-12-23 | Discharge: 2018-12-23 | Disposition: A | Discharge status: Still In Hospital | Payer: Medicare Other | Source: Ambulatory Visit | Attending: Internal Medicine | Admitting: Internal Medicine

## 2018-12-23 VITALS — Wt 286.0 lb

## 2018-12-23 VITALS — HR 67 | Temp 96.5°F | Resp 18 | Ht 62.0 in

## 2018-12-23 DIAGNOSIS — R7401 Elevation of levels of liver transaminase levels: Secondary | ICD-10-CM

## 2018-12-23 DIAGNOSIS — R740 Nonspecific elevation of levels of transaminase and lactic acid dehydrogenase [LDH]: Secondary | ICD-10-CM | POA: Insufficient documentation

## 2018-12-23 DIAGNOSIS — R05 Cough: Secondary | ICD-10-CM

## 2018-12-23 DIAGNOSIS — R9089 Other abnormal findings on diagnostic imaging of central nervous system: Secondary | ICD-10-CM | POA: Insufficient documentation

## 2018-12-23 DIAGNOSIS — R112 Nausea with vomiting, unspecified: Secondary | ICD-10-CM | POA: Insufficient documentation

## 2018-12-23 DIAGNOSIS — E876 Hypokalemia: Secondary | ICD-10-CM | POA: Insufficient documentation

## 2018-12-23 DIAGNOSIS — Z85038 Personal history of other malignant neoplasm of large intestine: Secondary | ICD-10-CM

## 2018-12-23 DIAGNOSIS — C3490 Malignant neoplasm of unspecified part of unspecified bronchus or lung: Secondary | ICD-10-CM | POA: Insufficient documentation

## 2018-12-23 DIAGNOSIS — T451X5A Adverse effect of antineoplastic and immunosuppressive drugs, initial encounter: Secondary | ICD-10-CM | POA: Insufficient documentation

## 2018-12-23 DIAGNOSIS — R0600 Dyspnea, unspecified: Secondary | ICD-10-CM

## 2018-12-23 DIAGNOSIS — C3431 Malignant neoplasm of lower lobe, right bronchus or lung: Secondary | ICD-10-CM

## 2018-12-23 DIAGNOSIS — Z5111 Encounter for antineoplastic chemotherapy: Secondary | ICD-10-CM

## 2018-12-23 DIAGNOSIS — R059 Cough, unspecified: Secondary | ICD-10-CM

## 2018-12-23 LAB — PERFORM POC COMPREHENSIVE METABOLIC PANEL (NON-FASTING)
ALBUMIN: 0
ALKALINE PHOSPHATASE: 0
ALT (SGPT): 0
ANION GAP: 0
AST (SGOT): 0
BILIRUBIN, TOTAL: 0
BUN/CREAT RATIO: 0
BUN: 0
CALCIUM: 0
CARBON DIOXIDE: 0
CHLORIDE: 0
CREATININE: 0
ESTIMATED GLOMERULAR FILTRATION RATE: 0
GLUCOSE,NONFAST: 0
POTASSIUM: 0
SODIUM: 0
TOTAL PROTEIN: 0

## 2018-12-23 LAB — PERFORM POC COMPLETE BLOOD COUNT W/AUTO DIFF
HCT: 0
HGB: 0
LYMPHOCYTES: 0
LYMPHS ABS: 0
MCH: 0
MCHC: 0
MCV: 0
MIXED CELL ABSOLUTE (MXD#): 0
MIXED CELL PERCENT (MXD%): 0 %
MPV: 0
PLATELET COUNT: 0
PMN ABS: 0
PMN'S: 0
RBC: 0
RDW: 0
WBC: 0

## 2018-12-23 MED ORDER — DURVALUMAB 50 MG/ML INTRAVENOUS SOLUTION
10.00 mg/kg | Freq: Once | INTRAVENOUS | Status: AC
Start: 2018-12-23 — End: 2018-12-23
  Administered 2018-12-23: 14:00:00 1250 mg via INTRAVENOUS
  Administered 2018-12-23: 15:00:00 0 mg via INTRAVENOUS
  Filled 2018-12-23: qty 25

## 2018-12-23 MED ORDER — FLUTICASONE PROPIONATE 50 MCG/ACTUATION NASAL SPRAY,SUSPENSION
1.0000 | Freq: Every day | NASAL | 2 refills | Status: DC
Start: 2018-12-23 — End: 2019-12-03

## 2018-12-23 MED ORDER — LORATADINE 10 MG TABLET
10.0000 mg | ORAL_TABLET | Freq: Every day | ORAL | 0 refills | Status: DC
Start: 2018-12-23 — End: 2019-11-23

## 2018-12-23 MED ADMIN — Medication: INTRAVENOUS | @ 14:00:00

## 2018-12-23 MED ADMIN — Medication: INTRAVENOUS | @ 15:00:00

## 2018-12-23 NOTE — Progress Notes (Signed)
[place]  [place] [address]        Encounter Date: 12/23/2018  10:30 AM EDT    Name:  Kelsey [name]  Age: 68 y.o.  DOB: 08/14/1951  Sex: female  PCP: Benjie Karvonen    Chief Complaint:    No chief complaint on file.    HISTORY OF PRESENT ILLNESS:   68 y.o. female with history of stage III colon cancer of present evaluation and management of lung cancer.    1. September 2015. Patient underwent sigmoidoscopy and was found to have polyp which was adenocarcinoma.    2. June 10, 2014. She was admitted for segmental resection of sigmoid colon. This revealed pT3, N1a, MX, low-grade  adenocarcinoma of the colon. The mass measuring 4 x 3 x 0.5 centimeters. It  was low-grade and there was no evidence of microsatellite instability by  histology. Margins were uninvolved and 4 lymph nodes only were removed which  only 1 was involved. This corresponding stage IIIB, T3, N1a, M0, low-grade  sigmoid cancer.     3. July 07, 2014. Recommended adjuvant chemotherapy with FOLFOX.    4. July 27, 2014. Patient was started on adjuvant chemotherapy with FOLFOX.  Patient received only 5 treatments and chemotherapy was stopped due to poor tolerance.  After that patient lost follow-up.      5. October 2019. Patient was admitted to hospital with concern for  bowel obstruction.  CT chest showed lung nodules.  Patient was managed conservatively and discharged home.    6. May 06, 2018.  Patient underwent CT-guided biopsy of lung lesion.  Pathology showed well-differentiated adenocarcinoma.  Section shows well-differentiated adenocarcinoma which positive for CK 7 and TTF 1, negative for P 63 and CK 20.     7. June 23, 2018. PET scan showed Hypermetabolic right lower lobe pulmonary mass compatible with malignancy.  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 Hepatomegaly and steatosis.     8. July 23, 2018. Patient was started on chemoradiation.    9. October 30, 2018. PET scan showed Hypermetabolic right lower lobe mass with abnormal mediastinal and hilar nodes. There has been only mild improvement since earlier exam.    10. October 31, 2018. Patient was started on maintenance immunotherapy with durvalumab.    SUBJECTIVE:  Patient presents for follow-up and treatment. Patient reports she is feeling much better just have excessive clear drainage. Patient reports no fevers and states she thinks the steroids really helped her. Patient reports her diarrhea has lessened and imodium is helping. Patient reports no abdominal pain. Patient reports no rash or skin lesions.       REVIEW OF SYSTEMS:  GENERAL:   Denies fever or chills   HEENT: no sore throat, congestion, blurry vision  Lungs:  No shortness of breath, reports cough and clear mucus drainage.  GI: no nausea, vomiting, constipation, no diarrhea  GU: No dysuria, urgency, increased frequency   Cardiac: no chest pains, palpitations, syncope,   Neuro: no weakness, loss of function, numbness, tingling  Skin: no rash  Musculoskeletal: No myalgias  Psychosocial: No depression, anxiety  Hematologic: No easy bruising, abnormal bleeding  All other review ROS negative except those mentioned above.  PATIENT HISTORY:  Past Medical History:   Diagnosis Date   . A-fib (CMS HCC)    . Arthropathy, unspecified, site unspecified    . Asthma    . Cancer (CMS HCC)     cervical   . Cataract    . Colon cancer (CMS [place]) 07/06/2014   . COPD (chronic obstructive pulmonary disease) (CMS HCC)    . Fistula    . HTN (hypertension)    . Hyperlipidemia    . Obesity    . Sleep apnea    . Ventral hernia        Past Surgical History:   Procedure Laterality Date   . ABSCESS DRAINAGE     . BOWEL RESECTION     . CATARACT EXTRACTION     . HX CERVICAL CONE BIOPSY      . HX CESAREAN SECTION     . HX GASTRIC BYPASS      25 years ago   . [place] (x2)   . HX HYSTERECTOMY      late  1990s   . HX LAP BANDING      2013   . HX LAPAROTOMY      2013 to remove lap band       Family Medical History:     Problem Relation (Age of Onset)    Diabetes Mother, Maternal Grandmother, Maternal Grandfather, Father    Heart Attack Mother    Stroke Father          Current Outpatient Medications   Medication Sig   . albuterol sulfate (PROVENTIL) 2.5 mg /3 mL (0.083 %) Inhalation Solution for Nebulization 3 mL (2.5 mg total) by Nebulization route Every 6 hours   . apixaban (ELIQUIS) 5 mg Oral Tablet Take 1 Tab (5 mg total) by mouth Twice daily   . atorvastatin (LIPITOR) 10 mg Oral Tablet Take 10 mg by mouth Once a day   . budesonide-formoterol (SYMBICORT) 160-4.5 mcg/actuation Inhalation HFA Aerosol Inhaler Take 2 Puffs by inhalation Twice daily   . docusate sodium (COLACE) 100 mg Oral Capsule Take 100 mg by mouth Three times a day as needed    . flecainide (TAMBOCOR) 100 mg Oral Tablet Take 100 mg by mouth Twice daily   . fluticasone propionate (FLONASE) 50 mcg/actuation Nasal Spray, Suspension 1 Spray by Each Nostril route Once a day   . linaCLOtide (LINZESS) 72 mcg Oral Capsule Take 72 mcg by mouth Every morning   . lisinopriL (PRINIVIL) 20 mg Oral Tablet Take 20 mg by mouth Once a day   . loperamide (IMODIUM) 2 mg Oral Capsule Take 2 mg by mouth Every 4 hours as needed   . loratadine (CLARITIN) 10 mg Oral Tablet Take 1 Tab (10 mg total) by mouth Once a day   . magnesium oxide (MAG-OX) 400 mg Oral Tablet Take 400 mg by mouth Once a day   . omeprazole (PRILOSEC) 20 mg Oral Capsule, Delayed Release(E.C.) Take 40 mg by mouth Once a day    . oxyCODONE (ROXICODONE) 5 mg Oral Tablet Take 1 Tab (5 mg total) by mouth Every 8 hours as needed for Pain   . potassium chloride (K-DUR) 20 mEq Oral Tab Sust.Rel. Particle/Crystal Take 20 mEq by mouth Once a day   . predniSONE (DELTASONE) 20 mg Oral Tablet Take 1 Tab (20 mg total) by mouth Once a day for 5 days   . prochlorperazine (COMPAZINE) 10 mg Oral Tablet Take  1 Tab (10 mg  total) by mouth Four times a day as needed for Nausea/Vomiting   . rOPINIRole (REQUIP) 0.5 mg Oral Tablet Take 0.5 mg by mouth Every night     Social History     Socioeconomic History   . Marital status: Married     Spouse name: Not on file   . Number of children: Not on file   . Years of education: Not on file   . Highest education level: Not on file   Tobacco Use   . Smoking status: Former Research scientist (life sciences)   . Smokeless tobacco: Never Used   . Tobacco comment: quit 10 yrs ago   Substance and Sexual Activity   . Alcohol use: Not Currently     Comment: rarely   . Drug use: No   Other Topics Concern   . Ability to Walk 1 Flight of Steps without SOB/CP No   . Ability To Do Own ADL's Yes       PHYSICAL EXAMINATION:  VITALS:   Vitals:    12/16/18 1027   BP: (!) 96/52   Pulse: 74   Resp: 16   Temp: 36.6 C (97.9 F)   TempSrc: Tympanic   SpO2: 94%   Height: 1.575 m (5' 2" )       PHYSICAL EXAM:   Consitutional: No acute distress.    Eyes: EOMI. No discharge. No Jaundice.   ENT: mucous membranes dry. No posterior pharynx lesions.. Neck supple, No palpable masses   Heme/ Lymph: No Cervical, Inguinal, axillary lymh nodes. No Bruising.   Cardiovascular: S1, S2, No murmurs, rubs, or gallops.   Respiratory: Wheezes in bilateral lower lung fields.   Abdomen: Normal Bowel Sounds, nontender nondistended. No hepatosplenomegaly.   Musculoskeletal: No Edema to the extremities.   Skin: Normal turgor. No Rashes,skin lesions   Psychiatry: Normal Affect   Neuro: No focal deficits. Alert and Oriented x 3      ENCOUNTER ORDERS:  Orders Placed This Encounter   . THYROID STIMULATING HORMONE (SENSITIVE TSH)   . ADDITIONAL TESTING  REQUEST   . fluticasone propionate (FLONASE) 50 mcg/actuation Nasal Spray, Suspension   . loratadine (CLARITIN) 10 mg Oral Tablet       LABORATORY/RADIOLOGICAL DATA: All pertinent labs/radiology were reviewed.       ASSESSMENT AND PLAN:  68 y.o. female with history of stage III colon cancer status post 5 cycle of FOLFOX now  presenting with stage III lung cancer.  I explained the reason for referred to Hematology/Oncology Clinic.    ICD-10-CM    1. Malignant neoplasm of lung, unspecified laterality, unspecified part of lung (CMS HCC) C34.90 THYROID STIMULATING HORMONE (SENSITIVE TSH)     ADDITIONAL TESTING  REQUEST   2. Other abnormal findings on diagnostic imaging of central nervous system  R90.89 THYROID STIMULATING HORMONE (SENSITIVE TSH)   3. Dyspnea, unspecified type R06.00    4. Transaminitis R74.0    5. CINV (chemotherapy-induced nausea and vomiting) R11.2     T45.1X5A    6. Cough R05    7. Hypokalemia E87.6        1. Adenocarcinoma lung:  Stage III.    CT brain to rule out metastatic disease.  NEGATIVE.   Started on chemoradiation carboplatin/Taxol along with radiation- July 23, 2018.  After completion of chemo radiation patient will get PET scan.  If continued to have good response will start maintenance immunotherapy for 1 year.   PET ordered for 4 week post radiation therapy Scheduled  for 10/30/2018   Completed 6/6 cycles of Taxol/Carboplatin and will start maintenance immunotherapy therapy following radiation therapy and PET scan    CHEMOTHERAPY:/RADIATION  CYCLE 2/26 DAY   DRUG DOSE TOTAL DOSE % ROUTE SCHEDULE   Carboplatin AUC- 2 205 mg    COMPLETED   Paclitaxel 45 mg/m2 108 mg    COMPLETED   Durvalumab                  Shared decision.  I discussed diagnosis, prognosis and treatment option with patient and family.  I had detailed discussion with patient regarding chemotherapy and radiation.  I also discussed brain scan to rule out metastatic disease before starting treatment.  I counseled patient regarding chemotherapy side effects and their management.  Patient understand and want to proceed with treatment.      2. Neutropenia:  Due to chemotherapy.   Resolved.    3. CINV    Zofran PRN    4. Transaminitis:   Will closely monitor   Instructed to avoid tylenol/alcoholic beverages    5. Thrombocytopenia:   Resolved   Resolved.   platelets today 276    6. Dyspnea/fevers: RESOLVED   COVID negative    CT chest on 12/19/18 reveals Developing postradiation changes in the right lung with continued decrease in size of the right lower lobe lung mass and mediastinal lymph nodes.    7. Hypokalemia   Instructed to take 20 mEq for 5 days       Return in about 2 weeks (around 01/06/2019) for cbc/diff, CMP, EXAM, Treatment.        Omer Jack, APRN, FNP-C        Addendum:      I saw and examined the patient.  I reviewed the NP s note.  I discussed the findings, assessment and plan of care as documented in the NP note and agree with documentation above.  Any exceptions/additions are edited/noted.    Vanetta Mulders, MD  Hematology/Oncology

## 2018-12-23 NOTE — Nurses Notes (Signed)
1330: Port flushed with 10 ml ns +BR, Imfinzi started to infuse over 1 hour. No complaints. [name], RN  1440: Imfinzi infusion complete, port flushed with 10 ml ns +BR followed by 5 ml heparin. Port de accessed with huber intact, band-aid applied to site. Patient discharged via wheelchair pushed by family. [name], RN

## 2018-12-24 ENCOUNTER — Encounter (HOSPITAL_COMMUNITY): Payer: Self-pay

## 2018-12-24 NOTE — Nursing Note (Signed)
Reason for Documentation: Survivorship Care Plan     A letter was mailed to the patient including the following:     Thank you for allowing [place] to provide you with care at the [facility] [address] [place], [place] Kelsey [name] at [facility]  Provided are two copies of your Survivorship Care Plan.  We are sending these in the mail instead of making appointment to review secondary to COVID-19 social distancing restrictions. A copy of this will be provided to each member of your Care Team. If you have any questions regarding this Care Plan, then please notify the office at [phone].     Kelsey [place]ington [place], FNP-BC

## 2019-01-07 ENCOUNTER — Encounter (HOSPITAL_COMMUNITY): Payer: Self-pay | Admitting: Nurse Practitioner

## 2019-01-07 ENCOUNTER — Other Ambulatory Visit: Payer: Self-pay

## 2019-01-07 ENCOUNTER — Inpatient Hospital Stay
Admission: RE | Admit: 2019-01-07 | Discharge: 2019-01-07 | Disposition: A | Discharge status: Still In Hospital | Payer: Medicare Other | Source: Ambulatory Visit | Attending: Nurse Practitioner | Admitting: Nurse Practitioner

## 2019-01-07 ENCOUNTER — Ambulatory Visit (HOSPITAL_COMMUNITY)
Admission: RE | Admit: 2019-01-07 | Discharge: 2019-01-07 | Disposition: A | Discharge status: Home / Self Care | Payer: Medicare Other | Source: Ambulatory Visit | Attending: Nurse Practitioner | Admitting: Nurse Practitioner

## 2019-01-07 ENCOUNTER — Encounter (HOSPITAL_COMMUNITY): Payer: Self-pay | Admitting: Internal Medicine

## 2019-01-07 ENCOUNTER — Ambulatory Visit (HOSPITAL_BASED_OUTPATIENT_CLINIC_OR_DEPARTMENT_OTHER): Payer: Medicare Other | Admitting: Nurse Practitioner

## 2019-01-07 VITALS — BP 138/70 | HR 66 | Temp 97.1°F | Ht 62.0 in | Wt 266.1 lb

## 2019-01-07 VITALS — BP 138/70 | HR 66 | Temp 97.0°F | Resp 18

## 2019-01-07 DIAGNOSIS — R0600 Dyspnea, unspecified: Secondary | ICD-10-CM

## 2019-01-07 DIAGNOSIS — R05 Cough: Secondary | ICD-10-CM | POA: Insufficient documentation

## 2019-01-07 DIAGNOSIS — T451X5A Adverse effect of antineoplastic and immunosuppressive drugs, initial encounter: Secondary | ICD-10-CM | POA: Insufficient documentation

## 2019-01-07 DIAGNOSIS — D649 Anemia, unspecified: Secondary | ICD-10-CM

## 2019-01-07 DIAGNOSIS — E785 Hyperlipidemia, unspecified: Secondary | ICD-10-CM | POA: Insufficient documentation

## 2019-01-07 DIAGNOSIS — R197 Diarrhea, unspecified: Secondary | ICD-10-CM | POA: Insufficient documentation

## 2019-01-07 DIAGNOSIS — R9089 Other abnormal findings on diagnostic imaging of central nervous system: Secondary | ICD-10-CM | POA: Insufficient documentation

## 2019-01-07 DIAGNOSIS — E876 Hypokalemia: Secondary | ICD-10-CM | POA: Insufficient documentation

## 2019-01-07 DIAGNOSIS — Z5111 Encounter for antineoplastic chemotherapy: Secondary | ICD-10-CM | POA: Insufficient documentation

## 2019-01-07 DIAGNOSIS — M549 Dorsalgia, unspecified: Secondary | ICD-10-CM | POA: Insufficient documentation

## 2019-01-07 DIAGNOSIS — C771 Secondary and unspecified malignant neoplasm of intrathoracic lymph nodes: Secondary | ICD-10-CM | POA: Insufficient documentation

## 2019-01-07 DIAGNOSIS — C3490 Malignant neoplasm of unspecified part of unspecified bronchus or lung: Secondary | ICD-10-CM

## 2019-01-07 DIAGNOSIS — Z8249 Family history of ischemic heart disease and other diseases of the circulatory system: Secondary | ICD-10-CM | POA: Insufficient documentation

## 2019-01-07 DIAGNOSIS — Z87891 Personal history of nicotine dependence: Secondary | ICD-10-CM | POA: Insufficient documentation

## 2019-01-07 DIAGNOSIS — R0609 Other forms of dyspnea: Secondary | ICD-10-CM | POA: Insufficient documentation

## 2019-01-07 DIAGNOSIS — Z9884 Bariatric surgery status: Secondary | ICD-10-CM | POA: Insufficient documentation

## 2019-01-07 DIAGNOSIS — Z85038 Personal history of other malignant neoplasm of large intestine: Secondary | ICD-10-CM | POA: Insufficient documentation

## 2019-01-07 DIAGNOSIS — G4730 Sleep apnea, unspecified: Secondary | ICD-10-CM | POA: Insufficient documentation

## 2019-01-07 DIAGNOSIS — R740 Nonspecific elevation of levels of transaminase and lactic acid dehydrogenase [LDH]: Secondary | ICD-10-CM | POA: Insufficient documentation

## 2019-01-07 DIAGNOSIS — J449 Chronic obstructive pulmonary disease, unspecified: Secondary | ICD-10-CM | POA: Insufficient documentation

## 2019-01-07 DIAGNOSIS — R52 Pain, unspecified: Secondary | ICD-10-CM

## 2019-01-07 DIAGNOSIS — R0789 Other chest pain: Secondary | ICD-10-CM | POA: Insufficient documentation

## 2019-01-07 DIAGNOSIS — Z7901 Long term (current) use of anticoagulants: Secondary | ICD-10-CM | POA: Insufficient documentation

## 2019-01-07 DIAGNOSIS — I4891 Unspecified atrial fibrillation: Secondary | ICD-10-CM | POA: Insufficient documentation

## 2019-01-07 DIAGNOSIS — R059 Cough, unspecified: Secondary | ICD-10-CM

## 2019-01-07 DIAGNOSIS — C7801 Secondary malignant neoplasm of right lung: Secondary | ICD-10-CM | POA: Insufficient documentation

## 2019-01-07 DIAGNOSIS — Z5112 Encounter for antineoplastic immunotherapy: Principal | ICD-10-CM | POA: Insufficient documentation

## 2019-01-07 DIAGNOSIS — R112 Nausea with vomiting, unspecified: Secondary | ICD-10-CM | POA: Insufficient documentation

## 2019-01-07 DIAGNOSIS — D701 Agranulocytosis secondary to cancer chemotherapy: Secondary | ICD-10-CM | POA: Insufficient documentation

## 2019-01-07 DIAGNOSIS — Z7951 Long term (current) use of inhaled steroids: Secondary | ICD-10-CM | POA: Insufficient documentation

## 2019-01-07 DIAGNOSIS — I10 Essential (primary) hypertension: Secondary | ICD-10-CM | POA: Insufficient documentation

## 2019-01-07 DIAGNOSIS — E669 Obesity, unspecified: Secondary | ICD-10-CM | POA: Insufficient documentation

## 2019-01-07 DIAGNOSIS — Z79899 Other long term (current) drug therapy: Secondary | ICD-10-CM | POA: Insufficient documentation

## 2019-01-07 DIAGNOSIS — Z6841 Body Mass Index (BMI) 40.0 and over, adult: Secondary | ICD-10-CM | POA: Insufficient documentation

## 2019-01-07 DIAGNOSIS — R7401 Elevation of levels of liver transaminase levels: Secondary | ICD-10-CM

## 2019-01-07 LAB — CBC WITH DIFF
BASOPHIL #: 0 10*3/uL (ref 0.00–0.20)
BASOPHIL %: 1 %
EOSINOPHIL #: 0.2 10*3/uL (ref 0.00–0.50)
EOSINOPHIL %: 3 %
HCT: 36.3 % (ref 34.6–46.2)
HGB: 11.5 g/dL — ABNORMAL LOW (ref 11.8–15.8)
LYMPHOCYTE #: 0.5 x10ˆ3/uL — ABNORMAL LOW (ref 0.90–3.40)
LYMPHOCYTE %: 8 %
MCH: 24.7 pg — ABNORMAL LOW (ref 27.6–33.2)
MCHC: 31.8 g/dL — ABNORMAL LOW (ref 32.6–35.4)
MCV: 77.7 fL — ABNORMAL LOW (ref 82.3–96.7)
MONOCYTE #: 0.6 10*3/uL (ref 0.20–0.90)
MONOCYTE %: 9 %
MPV: 7.3 fL (ref 6.6–10.2)
NEUTROPHIL #: 5 10*3/uL (ref 1.50–6.40)
NEUTROPHIL %: 80 %
PLATELETS: 241 10*3/uL (ref 140–440)
RBC: 4.67 x10ˆ6/uL (ref 3.80–5.24)
RDW: 17.3 % — ABNORMAL HIGH (ref 12.4–15.2)
WBC: 6.3 10*3/uL (ref 3.5–10.3)

## 2019-01-07 LAB — COMPREHENSIVE METABOLIC PANEL, NON-FASTING
ALBUMIN: 3.3 g/dL (ref 3.2–4.6)
ALKALINE PHOSPHATASE: 78 U/L (ref 20–130)
ALT (SGPT): 15 U/L (ref <=52)
ANION GAP: 8 mmol/L
AST (SGOT): 36 U/L — ABNORMAL HIGH (ref <=35)
BILIRUBIN TOTAL: 0.7 mg/dL (ref 0.3–1.2)
BUN/CREA RATIO: 14
BUN: 11 mg/dL (ref 10–25)
CALCIUM: 8.3 mg/dL — ABNORMAL LOW (ref 8.8–10.3)
CHLORIDE: 104 mmol/L (ref 98–111)
CO2 TOTAL: 26 mmol/L (ref 21–35)
CREATININE: 0.78 mg/dL (ref <=1.30)
ESTIMATED GFR: >60 mL/min/{1.73_m2}
GLUCOSE: 119 mg/dL — ABNORMAL HIGH (ref 70–110)
POTASSIUM: 3.2 mmol/L — ABNORMAL LOW (ref 3.5–5.0)
PROTEIN TOTAL: 5.8 g/dL — ABNORMAL LOW (ref 6.0–8.3)
PROTEIN TOTAL: 5.8 g/dL — ABNORMAL LOW (ref 6.0–8.3)
SODIUM: 138 mmol/L (ref 135–145)

## 2019-01-07 LAB — THYROID STIMULATING HORMONE (SENSITIVE TSH): TSH: 0.628 u[IU]/mL (ref 0.450–5.330)

## 2019-01-07 MED ORDER — SODIUM CHLORIDE 0.9 % INTRAVENOUS SOLUTION
10.0000 mg/kg | Freq: Once | INTRAVENOUS | Status: AC
Start: 2019-01-07 — End: 2019-01-07
  Administered 2019-01-07: 0 mg via INTRAVENOUS
  Administered 2019-01-07: 1250 mg via INTRAVENOUS
  Filled 2019-01-07: qty 25

## 2019-01-07 MED ORDER — CALCIUM 200 MG (AS CALCIUM CARBONATE 500 MG) CHEWABLE TABLET
1000.0000 mg | CHEWABLE_TABLET | Freq: Every day | ORAL | Status: DC
Start: 2019-01-07 — End: 2019-01-08
  Administered 2019-01-07: 1000 mg via ORAL
  Filled 2019-01-07: qty 2

## 2019-01-07 MED ADMIN — Medication: INTRAVENOUS | @ 12:00:00

## 2019-01-07 MED ADMIN — Medication: INTRAVENOUS | @ 13:00:00

## 2019-01-07 NOTE — Nurses Notes (Addendum)
1109 Tums given orally waiting on next med from pharmacy. Tobias Alexander, RN  9476 left chest port flushed with 10 ml NS, +BR. Imfinzi started to infuse over one hours. Tobias Alexander, RN  [phone] Imfinzi complete. Pt denies complaints. Port flushed with 26ml NS, +BR and heparinized with 40ml of Heparin. Port deaccessed and dressed with Band-Aid. Pt left via wheel chair. Tobias Alexander, RN

## 2019-01-07 NOTE — Progress Notes (Signed)
[place]  [place] [id number]        Encounter Date: 01/07/2019   9:45 AM EDT    Name:  Kelsey [name]  Age: 68 y.o.  DOB: 06-Jan-1951  Sex: female  PCP: [facility]    Chief Complaint:    Chief Complaint   Patient presents with   . Lung Cancer   . Neutropenia     HISTORY OF PRESENT ILLNESS:   68 y.o. female with history of stage III colon cancer of present evaluation and management of lung cancer.    1. September 2015. Patient underwent sigmoidoscopy and was found to have polyp which was adenocarcinoma.    2. June 10, 2014. She was admitted for segmental resection of sigmoid colon. This revealed pT3, N1a, MX, low-grade  adenocarcinoma of the colon. The mass measuring 4 x 3 x 0.5 centimeters. It  was low-grade and there was no evidence of microsatellite instability by  histology. Margins were uninvolved and 4 lymph nodes only were removed which  only 1 was involved. This corresponding stage IIIB, T3, N1a, M0, low-grade  sigmoid cancer.     3. July 07, 2014. Recommended adjuvant chemotherapy with FOLFOX.    4. July 27, 2014. Patient was started on adjuvant chemotherapy with FOLFOX.  Patient received only 5 treatments and chemotherapy was stopped due to poor tolerance.  After that patient lost follow-up.      5. October 2019. Patient was admitted to hospital with concern for  bowel obstruction.  CT chest showed lung nodules.  Patient was managed conservatively and discharged home.    6. May 06, 2018.  Patient underwent CT-guided biopsy of lung lesion.  Pathology showed well-differentiated adenocarcinoma.  Section shows well-differentiated adenocarcinoma which positive for CK 7 and TTF 1, negative for P 63 and CK 20.     7. June 23, 2018. PET scan showed Hypermetabolic right lower lobe pulmonary mass compatible with malignancy.  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  Hepatomegaly and steatosis.     8. July 23, 2018. Patient was started on chemoradiation.    9. October 30, 2018. PET scan showed Hypermetabolic right lower lobe mass with abnormal mediastinal and hilar nodes. There has been only mild improvement since earlier exam.    10. October 31, 2018. Patient was started on maintenance immunotherapy with durvalumab.    SUBJECTIVE:  Patient presents for follow-up and treatment.  Patient reports she is feeling much better but still has a small cough.  Patient states she is taking nebulizer at home and that has helped with her chest tightness.  Patient denies any fevers, night sweats, chills patient does report that she has had diarrhea since her colon cancer surgery and reports she is on Linzess.  Patient reports 3 bowel movements per day.  Patient reports no immune related toxicities.  Patient denies any headache, blurred vision, dizziness.        REVIEW OF SYSTEMS:  GENERAL:   Denies fever or chills   HEENT: no sore throat, congestion, blurry vision  Lungs:  No shortness of breath, reports cough and clear mucus drainage.  GI: no nausea, vomiting, constipation, no diarrhea  GU: No dysuria, urgency, increased frequency  Cardiac: no chest pains, palpitations, syncope,   Neuro: no weakness, loss of function, numbness, tingling  Skin: no rash  Musculoskeletal: No myalgias  Psychosocial: No depression, anxiety  Hematologic: No easy bruising, abnormal bleeding  All other review ROS negative except those mentioned above.     PATIENT HISTORY:  Past Medical History:   Diagnosis Date   . A-fib (CMS HCC)    . Arthropathy, unspecified, site unspecified    . Asthma    . Cancer (CMS HCC)     cervical   . Cataract    . Colon cancer (CMS [place]) 07/06/2014   . COPD (chronic obstructive pulmonary disease) (CMS HCC)    . Fistula    . HTN (hypertension)    . Hyperlipidemia    . Obesity    . Sleep apnea    . Ventral hernia        Past Surgical History:   Procedure Laterality Date   . ABSCESS DRAINAGE      . BOWEL RESECTION     . CATARACT EXTRACTION     . HX CERVICAL CONE BIOPSY      . HX CESAREAN SECTION     . HX GASTRIC BYPASS      25 years ago   . [place] (x2)   . HX HYSTERECTOMY      late 1990s   . HX LAP BANDING      2013   . HX LAPAROTOMY      2013 to remove lap band       Family Medical History:     Problem Relation (Age of Onset)    Diabetes Mother, Maternal Grandmother, Maternal Grandfather, Father    Heart Attack Mother    Stroke Father          Current Outpatient Medications   Medication Sig   . albuterol sulfate (PROVENTIL) 2.5 mg /3 mL (0.083 %) Inhalation Solution for Nebulization 3 mL (2.5 mg total) by Nebulization route Every 6 hours   . apixaban (ELIQUIS) 5 mg Oral Tablet Take 1 Tab (5 mg total) by mouth Twice daily   . atorvastatin (LIPITOR) 10 mg Oral Tablet Take 10 mg by mouth Once a day   . budesonide-formoterol (SYMBICORT) 160-4.5 mcg/actuation Inhalation HFA Aerosol Inhaler Take 2 Puffs by inhalation Twice daily   . docusate sodium (COLACE) 100 mg Oral Capsule Take 100 mg by mouth Three times a day as needed    . flecainide (TAMBOCOR) 100 mg Oral Tablet Take 100 mg by mouth Twice daily   . fluticasone propionate (FLONASE) 50 mcg/actuation Nasal Spray, Suspension 1 Spray by Each Nostril route Once a day   . linaCLOtide (LINZESS) 72 mcg Oral Capsule Take 72 mcg by mouth Every morning   . lisinopriL (PRINIVIL) 20 mg Oral Tablet Take 20 mg by mouth Once a day   . loperamide (IMODIUM) 2 mg Oral Capsule Take 2 mg by mouth Every 4 hours as needed   . loratadine (CLARITIN) 10 mg Oral Tablet Take 1 Tab (10 mg total) by mouth Once a day   . magnesium oxide (MAG-OX) 400 mg Oral Tablet Take 400 mg by mouth Once a day   . omeprazole (PRILOSEC) 20 mg Oral Capsule, Delayed Release(E.C.) Take 40 mg by mouth Once a day    . oxyCODONE (ROXICODONE) 5 mg Oral Tablet Take 1 Tab (5 mg total) by mouth Every 8 hours as needed for Pain   .  potassium chloride (K-DUR) 20 mEq Oral Tab Sust.Rel.  Particle/Crystal Take 20 mEq by mouth Once a day   . prochlorperazine (COMPAZINE) 10 mg Oral Tablet Take 1 Tab (10 mg total) by mouth Four times a day as needed for Nausea/Vomiting   . rOPINIRole (REQUIP) 0.5 mg Oral Tablet Take 0.5 mg by mouth Every night     Social History     Socioeconomic History   . Marital status: Married     Spouse name: Not on file   . Number of children: Not on file   . Years of education: Not on file   . Highest education level: Not on file   Tobacco Use   . Smoking status: Former Research scientist (life sciences)   . Smokeless tobacco: Never Used   . Tobacco comment: quit 10 yrs ago   Substance and Sexual Activity   . Alcohol use: Not Currently     Comment: rarely   . Drug use: No   Other Topics Concern   . Ability to Walk 1 Flight of Steps without SOB/CP No   . Ability To Do Own ADL's Yes       PHYSICAL EXAMINATION:  VITALS:   Vitals:    12/16/18 1027   BP: (!) 96/52   Pulse: 74   Resp: 16   Temp: 36.6 C (97.9 F)   TempSrc: Tympanic   SpO2: 94%   Height: 1.575 m (5' 2" )       PHYSICAL EXAM:   Consitutional: No acute distress.    Eyes: EOMI. No discharge. No Jaundice.   ENT: mucous membranes dry. No posterior pharynx lesions.. Neck supple, No palpable masses   Heme/ Lymph: No Cervical, Inguinal, axillary lymh nodes. No Bruising.   Cardiovascular: S1, S2, No murmurs, rubs, or gallops.   Respiratory: Wheezes in bilateral lower lung fields.   Abdomen: Normal Bowel Sounds, nontender nondistended. No hepatosplenomegaly.   Musculoskeletal: No Edema to the extremities.   Skin: Normal turgor. No Rashes,skin lesions   Psychiatry: Normal Affect   Neuro: No focal deficits. Alert and Oriented x 3      ENCOUNTER ORDERS:  No orders of the defined types were placed in this encounter.      LABORATORY/RADIOLOGICAL DATA: All pertinent labs/radiology were reviewed.       ASSESSMENT AND PLAN:  68 y.o. female with history of stage III colon cancer status post 5 cycle of FOLFOX now presenting with stage III lung cancer.  I explained  the reason for referred to Hematology/Oncology Clinic.    ICD-10-CM    1. Malignant neoplasm of lung, unspecified laterality, unspecified part of lung (CMS HCC) C34.90    2. Dyspnea, unspecified type R06.00    3. Encounter for antineoplastic chemotherapy Z51.11    4. CINV (chemotherapy-induced nausea and vomiting) R11.2     T45.1X5A    5. Cough R05    6. Chemotherapy induced nausea and vomiting R11.2     T45.1X5A    7. Transaminitis R74.0    8. Back pain, unspecified back location, unspecified back pain laterality, unspecified chronicity M54.9    9. Pain R52    10. Diarrhea, unspecified type R19.7    11. Dyspnea on exertion R06.09    12. Anemia, unspecified type D64.9        1. Adenocarcinoma lung:  Stage III.    CT brain to rule out metastatic disease.  NEGATIVE.   Started on chemoradiation carboplatin/Taxol along with radiation- July 23, 2018.  After completion of chemo radiation  patient will get PET scan.  If continued to have good response will start maintenance immunotherapy for 1 year.   Completed 6/6 cycles of Taxol/Carboplatin and will start maintenance immunotherapy therapy following radiation therapy and PET scan    CHEMOTHERAPY:/RADIATION  CYCLE 4/26 DAY   DRUG DOSE TOTAL DOSE % ROUTE SCHEDULE   Carboplatin AUC- 2 205 mg    COMPLETED   Paclitaxel 45 mg/m2 108 mg    COMPLETED   Durvalumab                  Shared decision.  I discussed diagnosis, prognosis and treatment option with patient and family.  I had detailed discussion with patient regarding chemotherapy and radiation.  I also discussed brain scan to rule out metastatic disease before starting treatment.  I counseled patient regarding chemotherapy side effects and their management.  Patient understand and want to proceed with treatment.      2. Neutropenia:  Due to chemotherapy.   Resolved.    3. CINV    Zofran PRN    4. Transaminitis:   Will closely monitor   Instructed to avoid tylenol/alcoholic beverages    5. Thrombocytopenia:   Resolved   Resolved.   platelets today 241    6. Dyspnea/fevers: RESOLVED   COVID negative    CT chest on 12/19/18 reveals Developing postradiation changes in the right lung with continued decrease in size of the right lower lobe lung mass and mediastinal lymph nodes.    7. Hypokalemia   Instructed to take 20 mEq for 5 days     8. Hypocalcemia   Gave two TUMS with treatment      Return in about 2 weeks (around 01/21/2019) for cbc/diff, CMP, EXAM, Treatment.        Omer Jack, APRN, FNP-C        The patient was seen independently by the Nurse Practitioner. I reviewed the patient's chart and agree with the NP's plan.     Vanetta Mulders, MD  Hematology/Oncology

## 2019-01-21 ENCOUNTER — Encounter (HOSPITAL_COMMUNITY): Payer: Self-pay | Admitting: Nurse Practitioner

## 2019-01-21 ENCOUNTER — Other Ambulatory Visit: Payer: Self-pay

## 2019-01-21 ENCOUNTER — Inpatient Hospital Stay (HOSPITAL_COMMUNITY)
Admission: RE | Admit: 2019-01-21 | Discharge: 2019-01-21 | Disposition: A | Discharge status: Still In Hospital | Payer: Medicare Other | Source: Ambulatory Visit | Attending: Nurse Practitioner | Admitting: Nurse Practitioner

## 2019-01-21 ENCOUNTER — Ambulatory Visit (HOSPITAL_COMMUNITY)
Admission: RE | Admit: 2019-01-21 | Discharge: 2019-01-21 | Disposition: A | Discharge status: Home / Self Care | Payer: Medicare Other | Source: Ambulatory Visit | Attending: Nurse Practitioner | Admitting: Nurse Practitioner

## 2019-01-21 ENCOUNTER — Ambulatory Visit (HOSPITAL_BASED_OUTPATIENT_CLINIC_OR_DEPARTMENT_OTHER): Payer: Medicare Other | Admitting: Nurse Practitioner

## 2019-01-21 ENCOUNTER — Other Ambulatory Visit (HOSPITAL_COMMUNITY): Payer: Self-pay | Admitting: Nurse Practitioner

## 2019-01-21 VITALS — BP 120/64 | HR 62 | Temp 97.0°F | Resp 16

## 2019-01-21 VITALS — BP 120/64 | HR 62 | Temp 97.0°F | Resp 16 | Ht 62.0 in | Wt 264.0 lb

## 2019-01-21 DIAGNOSIS — E876 Hypokalemia: Secondary | ICD-10-CM

## 2019-01-21 DIAGNOSIS — G4730 Sleep apnea, unspecified: Secondary | ICD-10-CM | POA: Insufficient documentation

## 2019-01-21 DIAGNOSIS — Z9884 Bariatric surgery status: Secondary | ICD-10-CM | POA: Insufficient documentation

## 2019-01-21 DIAGNOSIS — R52 Pain, unspecified: Secondary | ICD-10-CM

## 2019-01-21 DIAGNOSIS — Z5111 Encounter for antineoplastic chemotherapy: Secondary | ICD-10-CM

## 2019-01-21 DIAGNOSIS — D701 Agranulocytosis secondary to cancer chemotherapy: Secondary | ICD-10-CM | POA: Insufficient documentation

## 2019-01-21 DIAGNOSIS — Z87891 Personal history of nicotine dependence: Secondary | ICD-10-CM | POA: Insufficient documentation

## 2019-01-21 DIAGNOSIS — I4891 Unspecified atrial fibrillation: Secondary | ICD-10-CM | POA: Insufficient documentation

## 2019-01-21 DIAGNOSIS — Z7951 Long term (current) use of inhaled steroids: Secondary | ICD-10-CM | POA: Insufficient documentation

## 2019-01-21 DIAGNOSIS — G8929 Other chronic pain: Secondary | ICD-10-CM

## 2019-01-21 DIAGNOSIS — C3431 Malignant neoplasm of lower lobe, right bronchus or lung: Secondary | ICD-10-CM | POA: Insufficient documentation

## 2019-01-21 DIAGNOSIS — T451X5A Adverse effect of antineoplastic and immunosuppressive drugs, initial encounter: Secondary | ICD-10-CM

## 2019-01-21 DIAGNOSIS — J449 Chronic obstructive pulmonary disease, unspecified: Secondary | ICD-10-CM | POA: Insufficient documentation

## 2019-01-21 DIAGNOSIS — C3490 Malignant neoplasm of unspecified part of unspecified bronchus or lung: Secondary | ICD-10-CM

## 2019-01-21 DIAGNOSIS — I10 Essential (primary) hypertension: Secondary | ICD-10-CM | POA: Insufficient documentation

## 2019-01-21 DIAGNOSIS — R7401 Elevation of levels of liver transaminase levels: Secondary | ICD-10-CM

## 2019-01-21 DIAGNOSIS — R740 Nonspecific elevation of levels of transaminase and lactic acid dehydrogenase [LDH]: Secondary | ICD-10-CM

## 2019-01-21 DIAGNOSIS — R112 Nausea with vomiting, unspecified: Secondary | ICD-10-CM

## 2019-01-21 DIAGNOSIS — Z7901 Long term (current) use of anticoagulants: Secondary | ICD-10-CM | POA: Insufficient documentation

## 2019-01-21 DIAGNOSIS — M549 Dorsalgia, unspecified: Secondary | ICD-10-CM

## 2019-01-21 DIAGNOSIS — R0609 Other forms of dyspnea: Secondary | ICD-10-CM

## 2019-01-21 DIAGNOSIS — C771 Secondary and unspecified malignant neoplasm of intrathoracic lymph nodes: Secondary | ICD-10-CM | POA: Insufficient documentation

## 2019-01-21 DIAGNOSIS — E785 Hyperlipidemia, unspecified: Secondary | ICD-10-CM | POA: Insufficient documentation

## 2019-01-21 DIAGNOSIS — R197 Diarrhea, unspecified: Secondary | ICD-10-CM

## 2019-01-21 DIAGNOSIS — Z79899 Other long term (current) drug therapy: Secondary | ICD-10-CM | POA: Insufficient documentation

## 2019-01-21 DIAGNOSIS — E669 Obesity, unspecified: Secondary | ICD-10-CM | POA: Insufficient documentation

## 2019-01-21 DIAGNOSIS — R0600 Dyspnea, unspecified: Secondary | ICD-10-CM

## 2019-01-21 DIAGNOSIS — C7801 Secondary malignant neoplasm of right lung: Secondary | ICD-10-CM | POA: Insufficient documentation

## 2019-01-21 LAB — COMPREHENSIVE METABOLIC PANEL, NON-FASTING
ALBUMIN: 3.4 g/dL (ref 3.2–4.6)
ALKALINE PHOSPHATASE: 87 U/L (ref 20–130)
ALT (SGPT): 17 U/L (ref <=52)
ANION GAP: 8 mmol/L
AST (SGOT): 32 U/L (ref <=35)
BILIRUBIN TOTAL: 0.6 mg/dL (ref 0.3–1.2)
BUN/CREA RATIO: 10
BUN: 9 mg/dL — ABNORMAL LOW (ref 10–25)
CALCIUM: 8.5 mg/dL — ABNORMAL LOW (ref 8.8–10.3)
CHLORIDE: 105 mmol/L (ref 98–111)
CO2 TOTAL: 25 mmol/L (ref 21–35)
CREATININE: 0.86 mg/dL (ref <=1.30)
ESTIMATED GFR: >60 mL/min/{1.73_m2}
GLUCOSE: 117 mg/dL — ABNORMAL HIGH (ref 70–110)
POTASSIUM: 3.3 mmol/L — ABNORMAL LOW (ref 3.5–5.0)
PROTEIN TOTAL: 5.9 g/dL — ABNORMAL LOW (ref 6.0–8.3)
SODIUM: 138 mmol/L (ref 135–145)

## 2019-01-21 LAB — CBC WITH DIFF
BASOPHIL #: 0 10*3/uL (ref 0.00–0.20)
BASOPHIL %: 1 %
EOSINOPHIL #: 0.1 10*3/uL (ref 0.00–0.50)
EOSINOPHIL %: 3 %
HCT: 36.9 % (ref 34.6–46.2)
HGB: 11.8 g/dL (ref 11.8–15.8)
LYMPHOCYTE #: 0.6 10*3/uL — ABNORMAL LOW (ref 0.90–3.40)
LYMPHOCYTE %: 12 %
MCH: 24.7 pg — ABNORMAL LOW (ref 27.6–33.2)
MCHC: 32.1 g/dL — ABNORMAL LOW (ref 32.6–35.4)
MCV: 77 fL — ABNORMAL LOW (ref 82.3–96.7)
MONOCYTE #: 0.6 10*3/uL (ref 0.20–0.90)
MONOCYTE %: 12 %
MPV: 7.1 fL (ref 6.6–10.2)
NEUTROPHIL #: 3.5 10*3/uL (ref 1.50–6.40)
NEUTROPHIL %: 73 %
PLATELETS: 238 10*3/uL (ref 140–440)
RBC: 4.79 10*6/uL (ref 3.80–5.24)
RDW: 18.7 % — ABNORMAL HIGH (ref 12.4–15.2)
WBC: 4.9 10*3/uL (ref 3.5–10.3)

## 2019-01-21 MED ORDER — OXYCODONE 5 MG TABLET
5.00 mg | ORAL_TABLET | Freq: Four times a day (QID) | ORAL | 0 refills | Status: DC | PRN
Start: 2019-01-21 — End: 2019-03-11

## 2019-01-21 MED ORDER — SODIUM CHLORIDE 0.9 % INTRAVENOUS SOLUTION
10.0000 mg/kg | Freq: Once | INTRAVENOUS | Status: AC
Start: 2019-01-21 — End: 2019-01-21
  Administered 2019-01-21: 1250 mg via INTRAVENOUS
  Administered 2019-01-21: 0 mg via INTRAVENOUS
  Filled 2019-01-21: qty 25

## 2019-01-21 NOTE — Progress Notes (Addendum)
[place]  [name] [id number]        Encounter Date: 01/21/2019   9:45 AM EDT    Name:  Kelsey [name]  Age: 68 y.o.  DOB: May 13, 1951  Sex: female  PCP: [facility]    Chief Complaint:    Chief Complaint   Patient presents with   . Treatment     HISTORY OF PRESENT ILLNESS:   68 y.o. female with history of stage III colon cancer of present evaluation and management of lung cancer.    1. September 2015. Patient underwent sigmoidoscopy and was found to have polyp which was adenocarcinoma.    2. June 10, 2014. She was admitted for segmental resection of sigmoid colon. This revealed pT3, N1a, MX, low-grade  adenocarcinoma of the colon. The mass measuring 4 x 3 x 0.5 centimeters. It  was low-grade and there was no evidence of microsatellite instability by  histology. Margins were uninvolved and 4 lymph nodes only were removed which  only 1 was involved. This corresponding stage IIIB, T3, N1a, M0, low-grade  sigmoid cancer.     3. July 07, 2014. Recommended adjuvant chemotherapy with FOLFOX.    4. July 27, 2014. Patient was started on adjuvant chemotherapy with FOLFOX.  Patient received only 5 treatments and chemotherapy was stopped due to poor tolerance.  After that patient lost follow-up.      5. October 2019. Patient was admitted to hospital with concern for  bowel obstruction.  CT chest showed lung nodules.  Patient was managed conservatively and discharged home.    6. May 06, 2018.  Patient underwent CT-guided biopsy of lung lesion.  Pathology showed well-differentiated adenocarcinoma.  Section shows well-differentiated adenocarcinoma which positive for CK 7 and TTF 1, negative for P 63 and CK 20.     7. June 23, 2018. PET scan showed Hypermetabolic right lower lobe pulmonary mass compatible with malignancy.  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 Hepatomegaly and  steatosis.     8. July 23, 2018. Patient was started on chemoradiation.    9. October 30, 2018. PET scan showed Hypermetabolic right lower lobe mass with abnormal mediastinal and hilar nodes. There has been only mild improvement since earlier exam.    10. October 31, 2018. Patient was started on maintenance immunotherapy with durvalumab.    SUBJECTIVE:  Patient presents for follow-up and treatment.  Patient reports this is the best she has felt in a long time including her overall movement.  Patient reports she is still taking nebulizers at home and her breathing has significantly improved.  Patient reports she has or pole set up and she is going to start swimming to help condition her body.  Patient however does report that her pain has caused and interference with her daily activity and was wondering if her pain pills can be increased in interval or dosage so that she can get up and get moving.  Patient denies any fevers, chills, night sweats.  Patient denies any headache, blurred vision, dizziness.  Patient reports she does have ongoing chronic pain but no new pain.  Patient reports she does have a few bowel movements per day but it is related to [place]  use.  Patient denies any immune related toxicities.      REVIEW OF SYSTEMS:  GENERAL:   Denies fever or chills   HEENT: no sore throat, congestion, blurry vision  Lungs:  No shortness of breath, reports cough and clear mucus drainage.  GI: no nausea, vomiting, constipation, no diarrhea  GU: No dysuria, urgency, increased frequency   Cardiac: no chest pains, palpitations, syncope,   Neuro: no weakness, loss of function, numbness, tingling  Skin: no rash  Musculoskeletal: No myalgias  Psychosocial: No depression, anxiety  Hematologic: No easy bruising, abnormal bleeding  All other review ROS negative except those mentioned above.     PATIENT HISTORY:  Past Medical History:   Diagnosis Date   . A-fib (CMS HCC)    . Arthropathy, unspecified, site unspecified    .  Asthma    . Cancer (CMS HCC)     cervical   . Cataract    . Colon cancer (CMS [facility]) 07/06/2014   . COPD (chronic obstructive pulmonary disease) (CMS HCC)    . Fistula    . HTN (hypertension)    . Hyperlipidemia    . Obesity    . Sleep apnea    . Ventral hernia        Past Surgical History:   Procedure Laterality Date   . ABSCESS DRAINAGE     . BOWEL RESECTION     . CATARACT EXTRACTION     . HX CERVICAL CONE BIOPSY      . HX CESAREAN SECTION     . HX GASTRIC BYPASS      25 years ago   . [place] (x2)   . HX HYSTERECTOMY      late 1990s   . HX LAP BANDING      2013   . HX LAPAROTOMY      2013 to remove lap band       Family Medical History:     Problem Relation (Age of Onset)    Diabetes Mother, Maternal Grandmother, Maternal Grandfather, Father    Heart Attack Mother    Stroke Father          Current Outpatient Medications   Medication Sig   . albuterol sulfate (PROVENTIL) 2.5 mg /3 mL (0.083 %) Inhalation Solution for Nebulization 3 mL (2.5 mg total) by Nebulization route Every 6 hours   . apixaban (ELIQUIS) 5 mg Oral Tablet Take 1 Tab (5 mg total) by mouth Twice daily   . atorvastatin (LIPITOR) 10 mg Oral Tablet Take 10 mg by mouth Once a day   . budesonide-formoterol (SYMBICORT) 160-4.5 mcg/actuation Inhalation HFA Aerosol Inhaler Take 2 Puffs by inhalation Twice daily   . docusate sodium (COLACE) 100 mg Oral Capsule Take 100 mg by mouth Three times a day as needed    . flecainide (TAMBOCOR) 100 mg Oral Tablet Take 100 mg by mouth Twice daily   . fluticasone propionate (FLONASE) 50 mcg/actuation Nasal Spray, Suspension 1 Spray by Each Nostril route Once a day   . linaCLOtide (LINZESS) 72 mcg Oral Capsule Take 72 mcg by mouth Every morning   . lisinopriL (PRINIVIL) 20 mg Oral Tablet Take 20 mg by mouth Once a day   . loperamide (IMODIUM) 2 mg Oral Capsule Take 2 mg by mouth Every 4 hours as needed   . loratadine (CLARITIN) 10 mg Oral Tablet Take 1 Tab (10 mg total) by mouth Once a day   .  magnesium oxide (MAG-OX) 400 mg Oral Tablet Take 400 mg by mouth Once a day   . omeprazole (PRILOSEC) 20 mg Oral Capsule, Delayed Release(E.C.) Take 40 mg by mouth Once a day    . oxyCODONE (ROXICODONE) 5 mg Oral Tablet Take 1 Tab (5 mg total) by mouth Every 8 hours as needed for Pain   . potassium chloride (K-DUR) 20 mEq Oral Tab Sust.Rel. Particle/Crystal Take 20 mEq by mouth Once a day   . prochlorperazine (COMPAZINE) 10 mg Oral Tablet Take 1 Tab (10 mg total) by mouth Four times a day as needed for Nausea/Vomiting   . rOPINIRole (REQUIP) 0.5 mg Oral Tablet Take 0.5 mg by mouth Every night     Social History     Socioeconomic History   . Marital status: Married     Spouse name: Not on file   . Number of children: Not on file   . Years of education: Not on file   . Highest education level: Not on file   Tobacco Use   . Smoking status: Former Research scientist (life sciences)   . Smokeless tobacco: Never Used   . Tobacco comment: quit 10 yrs ago   Substance and Sexual Activity   . Alcohol use: Not Currently     Comment: rarely   . Drug use: No   Other Topics Concern   . Ability to Walk 1 Flight of Steps without SOB/CP No   . Ability To Do Own ADL's Yes       PHYSICAL EXAMINATION:  VITALS:   Vitals:    12/16/18 1027   BP: (!) 96/52   Pulse: 74   Resp: 16   Temp: 36.6 C (97.9 F)   TempSrc: Tympanic   SpO2: 94%   Height: 1.575 m (_0 )       PHYSICAL EXAM:   Consitutional: No acute distress.    Eyes: EOMI. No discharge. No Jaundice.   ENT: mucous membranes dry. No posterior pharynx lesions.. Neck supple, No palpable masses   Heme/ Lymph: No Cervical, Inguinal, axillary lymh nodes. No Bruising.   Cardiovascular: S1, S2, No murmurs, rubs, or gallops.   Respiratory: Wheezes in bilateral lower lung fields.   Abdomen: Normal Bowel Sounds, nontender nondistended. No hepatosplenomegaly.   Musculoskeletal: No Edema to the extremities.   Skin: Normal turgor. No Rashes,skin lesions   Psychiatry: Normal Affect   Neuro: No focal deficits. Alert and  Oriented x 3      ENCOUNTER ORDERS:  No orders of the defined types were placed in this encounter.      LABORATORY/RADIOLOGICAL DATA: All pertinent labs/radiology were reviewed.       ASSESSMENT AND PLAN:  68 y.o. female with history of stage III colon cancer status post 5 cycle of FOLFOX now presenting with stage III lung cancer.  I explained the reason for referred to Hematology/Oncology Clinic.    ICD-10-CM    1. Malignant neoplasm of lung, unspecified laterality, unspecified part of lung (CMS HCC) C34.90    2. Dyspnea, unspecified type R06.00    3. Encounter for antineoplastic chemotherapy Z51.11    4. CINV (chemotherapy-induced nausea and vomiting) R11.2     T45.1X5A    5. Pain R52    6. Back pain, unspecified back location, unspecified back pain laterality, unspecified chronicity M54.9    7. Chemotherapy induced nausea and vomiting R11.2     T45.1X5A    8. Transaminitis R74.0    9. Diarrhea, unspecified type R19.7    10.  Dyspnea on exertion R06.09        1. Adenocarcinoma lung:  Stage III.    CT brain to rule out metastatic disease.  NEGATIVE.   Started on chemoradiation carboplatin/Taxol along with radiation- July 23, 2018.  After completion of chemo radiation patient will get PET scan.  If continued to have good response will start maintenance immunotherapy for 1 year.   Completed 6/6 cycles of Taxol/Carboplatin and will start maintenance immunotherapy therapy following radiation therapy and PET scan    CHEMOTHERAPY:/RADIATION  CYCLE 5/26 DAY   DRUG DOSE TOTAL DOSE % ROUTE SCHEDULE   Carboplatin AUC- 2 205 mg    COMPLETED   Paclitaxel 45 mg/m2 108 mg    COMPLETED   Durvalumab                  Shared decision.  I discussed diagnosis, prognosis and treatment option with patient and family.  I had detailed discussion with patient regarding chemotherapy and radiation.  I also discussed brain scan to rule out metastatic disease before starting treatment.  I counseled patient regarding chemotherapy side  effects and their management.  Patient understand and want to proceed with treatment.      2. Neutropenia:  Due to chemotherapy.   Resolved.    3. CINV    Zofran PRN    4. Transaminitis: RESOLVED   Instructed to avoid tylenol/alcoholic beverages    5. Thrombocytopenia:  Resolved   platelets today 238    6. Dyspnea/fevers: RESOLVED   COVID negative    CT chest on 12/19/18 reveals Developing postradiation changes in the right lung with continued decrease in size of the right lower lobe lung mass and mediastinal lymph nodes.    7. Hypokalemia 3.2   Instructed to take 20 mEq for 5 days     Pain   Patient reports chronic in nature.   Will increase pain medication to every 6 hour instead of every 8 hours per Dr. Deatra Canter      Return in about 2 weeks (around 02/04/2019) for cbc/diff, CMP, EXAM, Treatment, Deatra Canter!!!!!!!!!!!!!!!!!!!!!!!!!!!!!!!!!!!!!!!!!!!!.        Omer Jack, APRN, FNP-C    The patient was seen independently by the Nurse Practitioner. I reviewed the patient's chart and agree with the NP's plan.     Vanetta Mulders, MD  Hematology/Oncology

## 2019-01-21 NOTE — Nurses Notes (Addendum)
1055-left chest port flushed with 10 ml NS, +BR. Imfinzi started to infuse over one hours.Ovid Curd, RN  1200- Imfinzi infusion complete without incidence.Port flushed with 10 cc NS and + br. Heparin flush per protocol. Port was deaccessed with huber needle intact. Bandage applied to site. Pt left unit in wheelchair voicing no concerns or complaints. Pt is aware of upcoming appointment times. [name], RN

## 2019-01-27 ENCOUNTER — Encounter (INDEPENDENT_AMBULATORY_CARE_PROVIDER_SITE_OTHER): Payer: Self-pay | Admitting: Cardiovascular Disease

## 2019-01-27 ENCOUNTER — Ambulatory Visit (INDEPENDENT_AMBULATORY_CARE_PROVIDER_SITE_OTHER): Payer: Medicare Other | Admitting: Cardiovascular Disease

## 2019-01-27 ENCOUNTER — Other Ambulatory Visit: Payer: Self-pay

## 2019-01-27 VITALS — BP 141/85 | HR 77 | Ht 62.0 in | Wt 264.0 lb

## 2019-01-27 DIAGNOSIS — J45909 Unspecified asthma, uncomplicated: Secondary | ICD-10-CM

## 2019-01-27 DIAGNOSIS — R9431 Abnormal electrocardiogram [ECG] [EKG]: Secondary | ICD-10-CM

## 2019-01-27 DIAGNOSIS — I480 Paroxysmal atrial fibrillation: Secondary | ICD-10-CM

## 2019-01-27 DIAGNOSIS — Z9289 Personal history of other medical treatment: Secondary | ICD-10-CM

## 2019-01-27 DIAGNOSIS — I10 Essential (primary) hypertension: Secondary | ICD-10-CM

## 2019-01-27 DIAGNOSIS — C3490 Malignant neoplasm of unspecified part of unspecified bronchus or lung: Secondary | ICD-10-CM

## 2019-01-27 DIAGNOSIS — E785 Hyperlipidemia, unspecified: Secondary | ICD-10-CM

## 2019-01-27 DIAGNOSIS — I4891 Unspecified atrial fibrillation: Secondary | ICD-10-CM

## 2019-01-27 NOTE — Nursing Note (Signed)
Pt did not have a med list with them at visit - went over list with patient   Advised patient to check AVS list with medicines at home and call if any discrepancies    -Confirmed correct pharmacy with patient for e-scribing     Travel screening completed. Pt is wearing a mask.

## 2019-01-27 NOTE — H&P (Deleted)
History and Physical  Name:  Kelsey [name]   DOB: 04-18-1951   MRN: [id number]   Date:  01/27/2019   PCP: [place]   History of Present Illness  Kavya Haag is a 68 y.o. female ***.  No specialty comments available.   Past Medical History  Past Medical History:   Diagnosis Date   . A-fib (CMS HCC)    . Arthropathy, unspecified, site unspecified    . Asthma    . Cancer (CMS HCC)     cervical   . Cataract    . Colon cancer (CMS [place]) 07/06/2014   . COPD (chronic obstructive pulmonary disease) (CMS HCC)    . Fistula    . HTN (hypertension)    . Hyperlipidemia    . Obesity    . Sleep apnea    . Ventral hernia           Patient Active Problem List    Diagnosis Date Noted   . Atrial fibrillation (CMS HCC) 09/25/2018   . Atrial fibrillation with RVR (CMS HCC) 08/29/2018   . Asthma 08/09/2018   . Neutropenic fever (CMS HCC) 08/07/2018   . Malignant neoplasm of lung, unspecified laterality, unspecified part of lung (CMS [place]) 06/26/2018   . Right lower lobe lung mass 03/22/2018   . Colon cancer (CMS [place]) 07/06/2014   . HTN (hypertension)    . Hyperlipidemia    . Obesity    . Arthropathy, unspecified, site unspecified    . COPD (chronic obstructive pulmonary disease) (CMS HCC)    . Ventral hernia       Medications  Current Outpatient Medications   Medication Sig   . albuterol sulfate (PROVENTIL) 2.5 mg /3 mL (0.083 %) Inhalation Solution for Nebulization 3 mL (2.5 mg total) by Nebulization route Every 6 hours   . apixaban (ELIQUIS) 5 mg Oral Tablet Take 1 Tab (5 mg total) by mouth Twice daily   . atorvastatin (LIPITOR) 10 mg Oral Tablet Take 10 mg by mouth Once a day   . budesonide-formoterol (SYMBICORT) 160-4.5 mcg/actuation Inhalation HFA Aerosol Inhaler Take 2 Puffs by inhalation Twice daily   . docusate sodium (COLACE) 100 mg Oral Capsule Take 100 mg by mouth Three times a day as needed    . flecainide (TAMBOCOR) 100 mg Oral Tablet Take 100 mg by mouth Twice daily   . fluticasone propionate (FLONASE) 50  mcg/actuation Nasal Spray, Suspension 1 Spray by Each Nostril route Once a day   . linaCLOtide (LINZESS) 72 mcg Oral Capsule Take 72 mcg by mouth Every morning   . lisinopriL (PRINIVIL) 20 mg Oral Tablet Take 20 mg by mouth Once a day   . loperamide (IMODIUM) 2 mg Oral Capsule Take 2 mg by mouth Every 4 hours as needed   . loratadine (CLARITIN) 10 mg Oral Tablet Take 1 Tab (10 mg total) by mouth Once a day   . magnesium oxide (MAG-OX) 400 mg Oral Tablet Take 400 mg by mouth Once a day   . omeprazole (PRILOSEC) 20 mg Oral Capsule, Delayed Release(E.C.) Take 40 mg by mouth Once a day    . oxyCODONE (ROXICODONE) 5 mg Oral Tablet Take 1 Tab (5 mg total) by mouth Every 6 hours as needed for Pain   . potassium chloride (K-DUR) 20 mEq Oral Tab Sust.Rel. Particle/Crystal Take 20 mEq by mouth Once a day   . prochlorperazine (COMPAZINE) 10 mg Oral Tablet Take 1 Tab (10 mg total) by mouth Four times  a day as needed for Nausea/Vomiting   . rOPINIRole (REQUIP) 0.5 mg Oral Tablet Take 0.5 mg by mouth Every night      Allergies  Allergies   Allergen Reactions   . Shellfish Containing Products Shortness of Breath, Rash and Itching     scallops   . Vancomycin Shortness of Breath   . Barium Iodide    . Iodine And Iodide Containing Products      Itching, shortness of breath       Past Surgical History  Past Surgical History:   Procedure Laterality Date   . ABSCESS DRAINAGE     . BOWEL RESECTION     . CATARACT EXTRACTION     . HX CERVICAL CONE BIOPSY      . HX CESAREAN SECTION     . HX GASTRIC BYPASS      25 years ago   . [place] (x2)   . HX HYSTERECTOMY      late 1990s   . HX LAP BANDING      2013   . HX LAPAROTOMY      2013 to remove lap band          Family History  Family Medical History:     Problem Relation (Age of Onset)    Diabetes Mother, Maternal Grandmother, Maternal Grandfather, Father    Heart Attack Mother    Stroke Father            Social History  Social History     Socioeconomic History   .  Marital status: Married     Spouse name: Not on file   . Number of children: Not on file   . Years of education: Not on file   . Highest education level: Not on file   Tobacco Use   . Smoking status: Former Research scientist (life sciences)   . Smokeless tobacco: Never Used   . Tobacco comment: quit 10 yrs ago   Substance and Sexual Activity   . Alcohol use: Not Currently     Comment: rarely   . Drug use: No   Other Topics Concern   . Ability to Walk 1 Flight of Steps without SOB/CP No   . Ability To Do Own ADL's Yes     Review of Systems  REVIEW OF SYSTEMS:   General: no fever, chills, or weight changes.   HEENT: no vision changes.   Cardiac: No chest pain, palpitations, dizziness, light-headedness, or near syncope.   Resp: No dyspnea at rest, no dyspnea on exertion; no cough or hemoptysis; no orthopnea or PND.  GI: No N/V. No melena or bright red blood per rectum.   Ext: No edema.Neuro: No focal weakness or numbess.   All other systems are negative    Examination  Blood pressure (!) 141/85, pulse 77, height 1.575 m (5\' 2" ), weight 120 kg (264 lb).   Gen: NAD. Alert.   HEENT: PERRL; conjuntiva clear. No JVD or carotid bruit.  Cardiovascular: RRR with normal S1, S2.   Pulmonary: Clear to auscultation bilaterally. No rales. No wheezing. No rhonci.  Abdomen: Soft, non-tender.  Extremities: No edema. No cyanosis. No clubbing.  Neurologic:  Grossly intact  Integument:  skin warm and dry, sclera non icteric   All other systems are negative  Assesment and Plan  Diagnostic Tests:  EKG was performed in the clinic and reviewed by Dr. Cristino Martes and showed: ***  Orders Placed This Encounter   .  EKG (93000)      1. Atrial fibrillation (CMS HCC)    2. Paroxysmal atrial fibrillation (CMS HCC)    3. Essential hypertension    4. Hyperlipidemia, unspecified hyperlipidemia type    5. Malignant neoplasm of lung, unspecified laterality, unspecified part of lung (CMS HCC)    6. Moderate asthma, unspecified whether complicated, unspecified whether persistent     Plan:  1. Continue medications as prescribed and activity as tolerated  2. BP at goal on current therapy  3. Hyperlipidemia.  Patient is on ***mg daily.  The patient should continue on maximum tolerable statin therapy.  LDL goal ideally <70.    Patient was seen with cosigning physician.  Patient was seen independently.  Case discussed and plan formulated with cosigning physician.    Rico Sheehan,  MS, PA-C  Physician Assistant-Certified  [facility]

## 2019-01-27 NOTE — Progress Notes (Signed)
[place], [place] [address]  Phone: [phone]  Fax: [phone]    Encounter Date: 01/27/2019    Patient ID:  Kelsey [name]  HRC:[id number]    DOB: [birth year]  Age: 68 y.o. female    Subjective:     Chief Complaint   Patient presents with   . New Patient     HPI  Current Outpatient Medications   Medication Sig   . albuterol sulfate (PROVENTIL) 2.5 mg /3 mL (0.083 %) Inhalation Solution for Nebulization 3 mL (2.5 mg total) by Nebulization route Every 6 hours   . apixaban (ELIQUIS) 5 mg Oral Tablet Take 1 Tab (5 mg total) by mouth Twice daily   . atorvastatin (LIPITOR) 10 mg Oral Tablet Take 10 mg by mouth Once a day   . budesonide-formoterol (SYMBICORT) 160-4.5 mcg/actuation Inhalation HFA Aerosol Inhaler Take 2 Puffs by inhalation Twice daily   . docusate sodium (COLACE) 100 mg Oral Capsule Take 100 mg by mouth Three times a day as needed    . flecainide (TAMBOCOR) 100 mg Oral Tablet Take 100 mg by mouth Twice daily   . fluticasone propionate (FLONASE) 50 mcg/actuation Nasal Spray, Suspension 1 Spray by Each Nostril route Once a day   . linaCLOtide (LINZESS) 72 mcg Oral Capsule Take 72 mcg by mouth Every morning   . lisinopriL (PRINIVIL) 20 mg Oral Tablet Take 20 mg by mouth Once a day   . loperamide (IMODIUM) 2 mg Oral Capsule Take 2 mg by mouth Every 4 hours as needed   . loratadine (CLARITIN) 10 mg Oral Tablet Take 1 Tab (10 mg total) by mouth Once a day   . magnesium oxide (MAG-OX) 400 mg Oral Tablet Take 400 mg by mouth Once a day   . omeprazole (PRILOSEC) 20 mg Oral Capsule, Delayed Release(E.C.) Take 40 mg by mouth Once a day    . oxyCODONE (ROXICODONE) 5 mg Oral Tablet Take 1 Tab (5 mg total) by mouth Every 6 hours as needed for Pain   . potassium chloride (K-DUR) 20 mEq Oral Tab Sust.Rel. Particle/Crystal Take 20 mEq by mouth Once a day   . prochlorperazine (COMPAZINE) 10 mg Oral Tablet Take 1 Tab (10 mg total) by mouth Four times a day as  needed for Nausea/Vomiting   . rOPINIRole (REQUIP) 0.5 mg Oral Tablet Take 0.5 mg by mouth Every night     Allergies   Allergen Reactions   . Shellfish Containing Products Shortness of Breath, Rash and Itching     scallops   . Vancomycin Shortness of Breath   . Barium Iodide    . Iodine And Iodide Containing Products      Itching, shortness of breath      Past Medical History:   Diagnosis Date   . A-fib (CMS HCC)    . Arthropathy, unspecified, site unspecified    . Asthma    . Cancer (CMS HCC)     cervical   . Cataract    . Colon cancer (CMS [place]) 07/06/2014   . COPD (chronic obstructive pulmonary disease) (CMS HCC)    . Fistula    . HTN (hypertension)    . Hyperlipidemia    . Obesity    . Sleep apnea    . Ventral hernia          Past Surgical History:   Procedure Laterality Date   . ABSCESS DRAINAGE     . BOWEL RESECTION     .  CATARACT EXTRACTION     . HX CERVICAL CONE BIOPSY      . HX CESAREAN SECTION     . HX GASTRIC BYPASS      25 years ago   . [place] (x2)   . HX HYSTERECTOMY      late 1990s   . HX LAP BANDING      2013   . HX LAPAROTOMY      2013 to remove lap band         Family Medical History:     Problem Relation (Age of Onset)    Diabetes Mother, Maternal Grandmother, Maternal Grandfather, Father    Heart Attack Mother    Stroke Father            Social History     Tobacco Use   . Smoking status: Former Research scientist (life sciences)   . Smokeless tobacco: Never Used   . Tobacco comment: quit 10 yrs ago   Substance Use Topics   . Alcohol use: Not Currently     Comment: rarely   . Drug use: No       Review of Systems  Objective:   Vitals: BP (!) 141/85   Pulse 77   Ht 1.575 m (5\' 2" )   Wt 120 kg (264 lb)   BMI 48.29 kg/m         Physical Exam    Assessment & Plan:   No diagnosis found.    No orders of the defined types were placed in this encounter.      No follow-ups on file.    Joette Catching, MD

## 2019-01-27 NOTE — Progress Notes (Signed)
Name: Kelsey [name]                       Date of Birth: [birth year]   MRN:  [id number]                         Date of visit: 01/27/2019     PCP: [place] is a 68 y.o. year old female who presents for New Patient  to clinic.  She has a history of hypertension, hyperlipidemia, asthma, lung CA and a-fib was diagnosed in 2017.   She is here to establish care for the a-fib that was diagnosed in 2017.  Her last episode was 09-2018 and was evaluated in the ER with subsequent admission. She was originally placed on Flecainide and has done well.  During the February event she was changed to Amiodarone but did not want to stay on that so she resumed the Flecainide when she was discharged.  She has not had any episodes of a-fib since discharged home.     She states she has chronic SOB & DOE but denies orthopnea.  She has a history of asthma and is on inhalers as well as taking OTC decongestants for the wet cough.  She was diagnosed with lung CA 2019 and has had radiation and multiple type of chemotherapy.      She denies chest pain, syncope or near syncope. She presents to office in a wheelchair but stays active with a vegetable garden, chickens and gets in her pool one hour 4 times a week for exercise. BP at home 140/70.  Patient Active Problem List    Diagnosis Date Noted   . Hx of echocardiogram 01/27/2019     09-25-2018 EF 50-55% ,o;d <R, PA 20-25 mmHg     . Atrial fibrillation (CMS HCC) 09/25/2018   . Atrial fibrillation with RVR (CMS HCC) 08/29/2018   . Asthma 08/09/2018   . Neutropenic fever (CMS HCC) 08/07/2018   . Malignant neoplasm of lung, unspecified laterality, unspecified part of lung (CMS [place]) 06/26/2018   . Right lower lobe lung mass 03/22/2018   . Colon cancer (CMS [place]) 07/06/2014   . HTN (hypertension)    . Hyperlipidemia    . Arthropathy, unspecified, site unspecified    . COPD (chronic obstructive pulmonary disease) (CMS HCC)    . Ventral hernia       Current  Outpatient Medications   Medication Sig   . albuterol sulfate (PROVENTIL) 2.5 mg /3 mL (0.083 %) Inhalation Solution for Nebulization 3 mL (2.5 mg total) by Nebulization route Every 6 hours   . apixaban (ELIQUIS) 5 mg Oral Tablet Take 1 Tab (5 mg total) by mouth Twice daily   . atorvastatin (LIPITOR) 10 mg Oral Tablet Take 10 mg by mouth Once a day   . budesonide-formoterol (SYMBICORT) 160-4.5 mcg/actuation Inhalation HFA Aerosol Inhaler Take 2 Puffs by inhalation Twice daily   . docusate sodium (COLACE) 100 mg Oral Capsule Take 100 mg by mouth Three times a day as needed    . flecainide (TAMBOCOR) 100 mg Oral Tablet Take 100 mg by mouth Twice daily   . fluticasone propionate (FLONASE) 50 mcg/actuation Nasal Spray, Suspension 1 Spray by Each Nostril route Once a day   . linaCLOtide (LINZESS) 72 mcg Oral Capsule Take 72 mcg by mouth Every morning   . lisinopriL (PRINIVIL) 20 mg Oral Tablet Take 20  mg by mouth Once a day   . loperamide (IMODIUM) 2 mg Oral Capsule Take 2 mg by mouth Every 4 hours as needed   . loratadine (CLARITIN) 10 mg Oral Tablet Take 1 Tab (10 mg total) by mouth Once a day   . magnesium oxide (MAG-OX) 400 mg Oral Tablet Take 400 mg by mouth Once a day   . omeprazole (PRILOSEC) 20 mg Oral Capsule, Delayed Release(E.C.) Take 40 mg by mouth Once a day    . oxyCODONE (ROXICODONE) 5 mg Oral Tablet Take 1 Tab (5 mg total) by mouth Every 6 hours as needed for Pain   . potassium chloride (K-DUR) 20 mEq Oral Tab Sust.Rel. Particle/Crystal Take 20 mEq by mouth Once a day   . prochlorperazine (COMPAZINE) 10 mg Oral Tablet Take 1 Tab (10 mg total) by mouth Four times a day as needed for Nausea/Vomiting   . rOPINIRole (REQUIP) 0.5 mg Oral Tablet Take 0.5 mg by mouth Every night      REVIEW OF SYSTEMS:   General: No fever.  No chills.  No weight changes.  HEENT: No vision changes.  Cardiovascular: No chest pain. No palpitations.  No dizziness.  No light-headedness.  No near syncope.  Pulmonary: Positive for  dyspnea at rest, Positive for dyspnea on exertion; Positive for cough, No hemoptysis; no orthopnea or PND.  GI: No N/V. No melena.  No bright red blood per rectum.  Ext: No edema.  No claudication.  Neuro: No focal weakness.  No numbness.  All other ROS negative.  Objective:   BP (!) 141/85   Pulse 77   Ht 1.575 m (5\' 2" )   Wt 120 kg (264 lb)   BMI 48.29 kg/m        Gen: NAD. Alert.   HEENT: PERRL; conjuntiva clear. No JVD or carotid bruit.  Cardiac: RRR with normal S1, S2.   Respiratory: Clear to auscultation bilaterally. No rales. No wheezing. No rhonci.  Abdomen: Soft, non-tender.  Extremities: No edema. No cyanosis. No clubbing.  Neurologic:  Grossly intact  Skin: Warm and dry   All other systems are negative  Assessment/Plan  1. Atrial fibrillation (CMS HCC)    2. Paroxysmal atrial fibrillation (CMS HCC)    3. Essential hypertension    4. Hyperlipidemia, unspecified hyperlipidemia type    5. Malignant neoplasm of lung, unspecified laterality, unspecified part of lung (CMS HCC)    6. Moderate asthma, unspecified whether complicated, unspecified whether persistent    7. Hx of echocardiogram    Plan:  1. Continue medications as prescribed and activity as tolerated  2. Patient prefers to stay on Flecainide but was advised if symptoms worsen she may need to be on Amiodarone.  She was advised of medicines to avoid including Compazine which is on her chronic medicine list.  3. BP elevated today and she states that she is taking Coreg and will call with dose  4. She is currently on treatment for Lung CA.  S/P radiation, past and current chemo  5. She has had asthma for a long time and is on Symbicort.  Was advised to stop decongestants as they may worsen a-fib and BP and replace with Mucinex 1200mg  BID for an expectorant  6. Call if problems  7. Will see back in 6 months or prn    Orders Placed This Encounter   . EKG (93000)   EKG reviewed with Dr. Theodoro Kos- NSR with prolonged QT that is compatible with Flecainide  therapy.  Will monitor.      Patient seen in conjunction with cosigning physician.    A portion of this documentation may have been generated using MMODAL voice recognition software and may contain syntax/voice recognition errors.     Leonie Man Harrington Challenger, MS, PA-C  Physician Assistant-Certified  [facility]   Patient seen and examined by me on this visit. My findings are in agreement with those of the advanced practice provider except unless documented below. Treatment assessment and changes directed by me.    Joette Catching, MD

## 2019-02-04 ENCOUNTER — Ambulatory Visit (HOSPITAL_BASED_OUTPATIENT_CLINIC_OR_DEPARTMENT_OTHER): Payer: Medicare Other | Admitting: Internal Medicine

## 2019-02-04 ENCOUNTER — Other Ambulatory Visit (HOSPITAL_COMMUNITY): Payer: Self-pay | Admitting: Internal Medicine

## 2019-02-04 ENCOUNTER — Ambulatory Visit (HOSPITAL_COMMUNITY)
Admission: RE | Admit: 2019-02-04 | Discharge: 2019-02-04 | Disposition: A | Discharge status: Home / Self Care | Payer: Medicare Other | Source: Ambulatory Visit | Attending: Internal Medicine | Admitting: Internal Medicine

## 2019-02-04 ENCOUNTER — Other Ambulatory Visit: Payer: Self-pay

## 2019-02-04 ENCOUNTER — Encounter (HOSPITAL_COMMUNITY): Payer: Self-pay | Admitting: Internal Medicine

## 2019-02-04 ENCOUNTER — Inpatient Hospital Stay
Admission: RE | Admit: 2019-02-04 | Discharge: 2019-02-04 | Disposition: A | Discharge status: Still In Hospital | Payer: Medicare Other | Source: Ambulatory Visit | Attending: Internal Medicine | Admitting: Internal Medicine

## 2019-02-04 VITALS — BP 159/81 | HR 66 | Temp 97.0°F | Resp 16

## 2019-02-04 VITALS — BP 159/81 | HR 66 | Temp 97.1°F | Ht 62.0 in | Wt 274.0 lb

## 2019-02-04 DIAGNOSIS — Z9884 Bariatric surgery status: Secondary | ICD-10-CM | POA: Insufficient documentation

## 2019-02-04 DIAGNOSIS — T451X5A Adverse effect of antineoplastic and immunosuppressive drugs, initial encounter: Secondary | ICD-10-CM | POA: Insufficient documentation

## 2019-02-04 DIAGNOSIS — Z03818 Encounter for observation for suspected exposure to other biological agents ruled out: Secondary | ICD-10-CM | POA: Insufficient documentation

## 2019-02-04 DIAGNOSIS — D701 Agranulocytosis secondary to cancer chemotherapy: Secondary | ICD-10-CM | POA: Insufficient documentation

## 2019-02-04 DIAGNOSIS — Z79899 Other long term (current) drug therapy: Secondary | ICD-10-CM | POA: Insufficient documentation

## 2019-02-04 DIAGNOSIS — C3431 Malignant neoplasm of lower lobe, right bronchus or lung: Secondary | ICD-10-CM | POA: Insufficient documentation

## 2019-02-04 DIAGNOSIS — C3490 Malignant neoplasm of unspecified part of unspecified bronchus or lung: Principal | ICD-10-CM

## 2019-02-04 DIAGNOSIS — E669 Obesity, unspecified: Secondary | ICD-10-CM | POA: Insufficient documentation

## 2019-02-04 DIAGNOSIS — E876 Hypokalemia: Secondary | ICD-10-CM

## 2019-02-04 DIAGNOSIS — I4891 Unspecified atrial fibrillation: Secondary | ICD-10-CM | POA: Insufficient documentation

## 2019-02-04 DIAGNOSIS — I10 Essential (primary) hypertension: Secondary | ICD-10-CM | POA: Insufficient documentation

## 2019-02-04 DIAGNOSIS — C3491 Malignant neoplasm of unspecified part of right bronchus or lung: Secondary | ICD-10-CM

## 2019-02-04 DIAGNOSIS — Z7901 Long term (current) use of anticoagulants: Secondary | ICD-10-CM | POA: Insufficient documentation

## 2019-02-04 DIAGNOSIS — Z87891 Personal history of nicotine dependence: Secondary | ICD-10-CM | POA: Insufficient documentation

## 2019-02-04 DIAGNOSIS — E785 Hyperlipidemia, unspecified: Secondary | ICD-10-CM | POA: Insufficient documentation

## 2019-02-04 DIAGNOSIS — J449 Chronic obstructive pulmonary disease, unspecified: Secondary | ICD-10-CM | POA: Insufficient documentation

## 2019-02-04 DIAGNOSIS — R112 Nausea with vomiting, unspecified: Secondary | ICD-10-CM

## 2019-02-04 DIAGNOSIS — Z7951 Long term (current) use of inhaled steroids: Secondary | ICD-10-CM | POA: Insufficient documentation

## 2019-02-04 DIAGNOSIS — Z5111 Encounter for antineoplastic chemotherapy: Secondary | ICD-10-CM

## 2019-02-04 DIAGNOSIS — G4730 Sleep apnea, unspecified: Secondary | ICD-10-CM | POA: Insufficient documentation

## 2019-02-04 DIAGNOSIS — G8929 Other chronic pain: Secondary | ICD-10-CM

## 2019-02-04 LAB — CBC WITH DIFF
BASOPHIL #: 0 10*3/uL (ref 0.00–0.20)
BASOPHIL %: 1 %
EOSINOPHIL #: 0.1 10*3/uL (ref 0.00–0.50)
EOSINOPHIL %: 2 %
HCT: 37.4 % (ref 34.6–46.2)
HGB: 11.9 g/dL (ref 11.8–15.8)
LYMPHOCYTE #: 0.6 10*3/uL — ABNORMAL LOW (ref 0.90–3.40)
LYMPHOCYTE %: 12 %
MCH: 24.5 pg — ABNORMAL LOW (ref 27.6–33.2)
MCH: 24.5 pg — ABNORMAL LOW (ref 27.6–33.2)
MCHC: 32 g/dL — ABNORMAL LOW (ref 32.6–35.4)
MCV: 76.5 fL — ABNORMAL LOW (ref 82.3–96.7)
MONOCYTE #: 0.6 10*3/uL (ref 0.20–0.90)
MONOCYTE %: 11 %
MPV: 7.3 fL (ref 6.6–10.2)
MPV: 7.3 fL (ref 6.6–10.2)
NEUTROPHIL #: 3.9 10*3/uL (ref 1.50–6.40)
NEUTROPHIL %: 74 %
PLATELETS: 213 10*3/uL (ref 140–440)
RBC: 4.88 10*6/uL (ref 3.80–5.24)
RDW: 19 % — ABNORMAL HIGH (ref 12.4–15.2)
WBC: 5.3 10*3/uL (ref 3.5–10.3)

## 2019-02-04 LAB — COMPREHENSIVE METABOLIC PANEL, NON-FASTING
ALBUMIN: 3.5 g/dL (ref 3.2–4.6)
ALKALINE PHOSPHATASE: 89 U/L (ref 20–130)
ALT (SGPT): 14 U/L (ref <=52)
ANION GAP: 9 mmol/L
AST (SGOT): 24 U/L (ref <=35)
BILIRUBIN TOTAL: 0.7 mg/dL (ref 0.3–1.2)
BUN/CREA RATIO: 12
BUN: 9 mg/dL — ABNORMAL LOW (ref 10–25)
CALCIUM: 8.3 mg/dL — ABNORMAL LOW (ref 8.8–10.3)
CHLORIDE: 103 mmol/L (ref 98–111)
CO2 TOTAL: 27 mmol/L (ref 21–35)
CREATININE: 0.78 mg/dL (ref <=1.30)
ESTIMATED GFR: >60 mL/min/{1.73_m2}
GLUCOSE: 132 mg/dL — ABNORMAL HIGH (ref 70–110)
POTASSIUM: 3 mmol/L — ABNORMAL LOW (ref 3.5–5.0)
PROTEIN TOTAL: 5.9 g/dL — ABNORMAL LOW (ref 6.0–8.3)
PROTEIN TOTAL: 5.9 g/dL — ABNORMAL LOW (ref 6.0–8.3)
SODIUM: 139 mmol/L (ref 135–145)

## 2019-02-04 MED ORDER — BUDESONIDE-FORMOTEROL HFA 160 MCG-4.5 MCG/ACTUATION AEROSOL INHALER
2.00 | INHALATION_SPRAY | Freq: Two times a day (BID) | RESPIRATORY_TRACT | 0 refills | Status: AC
Start: 2019-02-04 — End: ?

## 2019-02-04 MED ORDER — SODIUM CHLORIDE 0.9 % INTRAVENOUS SOLUTION
10.0000 mg/kg | Freq: Once | INTRAVENOUS | Status: AC
Start: 2019-02-04 — End: 2019-02-04
  Administered 2019-02-04: 1250 mg via INTRAVENOUS
  Administered 2019-02-04: 12:00:00 0 mg via INTRAVENOUS
  Filled 2019-02-04: qty 25

## 2019-02-04 MED ORDER — ALBUTEROL SULFATE HFA 90 MCG/ACTUATION AEROSOL INHALER
1.0000 | INHALATION_SPRAY | Freq: Four times a day (QID) | RESPIRATORY_TRACT | 0 refills | Status: DC | PRN
Start: 2019-02-04 — End: 2019-05-11

## 2019-02-04 MED ORDER — POTASSIUM CHLORIDE ER 20 MEQ TABLET,EXTENDED RELEASE(PART/CRYST)
40.00 meq | ORAL_TABLET | ORAL | Status: AC
Start: 2019-02-04 — End: 2019-02-04
  Administered 2019-02-04: 40 meq via ORAL
  Filled 2019-02-04: qty 2

## 2019-02-04 MED ADMIN — Medication: INTRAVENOUS | @ 11:00:00

## 2019-02-04 NOTE — Nurses Notes (Addendum)
1057: K-dur given to Pt. Pt declined to take medication at this time but took medication home with her to take with her lunch. Pt verbalized understanding of medication and lab results. Heidi Zbosnik, RN  [place] flushed with 10 cc NS and + br. Imfinzi infusion started over 1 hour. [name], RN  [phone]- infusion complete. Port flushed with 10cc NS +br and 5cc heparin. Port deaccessed band-aid applied to site. Pt tol well, no complaints. Pt d/c in wheelchair. Lilyan Gilford, RN

## 2019-02-04 NOTE — Progress Notes (Signed)
[name]  [name] [address]        Encounter Date: 02/04/2019   9:30 AM EDT    Name:  Kelsey [name]  Age: 67 y.o.  DOB: June 20, 1951  Sex: female  PCP: [facility]    Chief Complaint:    Chief Complaint   Patient presents with   . Lung Cancer   . Shortness of Breath     HISTORY OF PRESENT ILLNESS:   68 y.o. female with history of stage III colon cancer of present evaluation and management of lung cancer.    1. September 2015. Patient underwent sigmoidoscopy and was found to have polyp which was adenocarcinoma.    2. June 10, 2014. She was admitted for segmental resection of sigmoid colon. This revealed pT3, N1a, MX, low-grade  adenocarcinoma of the colon. The mass measuring 4 x 3 x 0.5 centimeters. It  was low-grade and there was no evidence of microsatellite instability by  histology. Margins were uninvolved and 4 lymph nodes only were removed which  only 1 was involved. This corresponding stage IIIB, T3, N1a, M0, low-grade  sigmoid cancer.     3. July 07, 2014. Recommended adjuvant chemotherapy with FOLFOX.    4. July 27, 2014. Patient was started on adjuvant chemotherapy with FOLFOX.  Patient received only 5 treatments and chemotherapy was stopped due to poor tolerance.  After that patient lost follow-up.      5. October 2019. Patient was admitted to hospital with concern for  bowel obstruction.  CT chest showed lung nodules.  Patient was managed conservatively and discharged home.    6. May 06, 2018.  Patient underwent CT-guided biopsy of lung lesion.  Pathology showed well-differentiated adenocarcinoma.  Section shows well-differentiated adenocarcinoma which positive for CK 7 and TTF 1, negative for P 63 and CK 20.     7. June 23, 2018. PET scan showed Hypermetabolic right lower lobe pulmonary mass compatible with malignancy.  Metastatic adenopathy in the right hilum and  mediastinum. Hepatomegaly and steatosis.     8. July 23, 2018. Patient was started on chemoradiation.    9. October 30, 2018. PET scan showed Hypermetabolic right lower lobe mass with abnormal mediastinal and hilar nodes. There has been only mild improvement since earlier exam.    10. October 31, 2018. Patient was started on maintenance immunotherapy with durvalumab.    SUBJECTIVE:  Patient presents for follow-up and treatment.  Denies any new issues since last visit.  Continued to have baseline shortness of breath.  No diarrhea constipation      REVIEW OF SYSTEMS:  GENERAL:   Denies fever or chills   HEENT: no sore throat, congestion, blurry vision  Lungs:  Baseline shortness of breath  GI: no nausea, vomiting, constipation, no diarrhea  GU: No dysuria, urgency, increased frequency   Cardiac: no chest pains, palpitations, syncope,   Neuro: no weakness, loss of function, numbness, tingling  Skin: no rash  Musculoskeletal: No myalgias  Psychosocial: No depression, anxiety  Hematologic: No easy bruising, abnormal bleeding  All other review ROS negative except those mentioned above.     PATIENT HISTORY:  Past Medical History:   Diagnosis Date   . A-fib (CMS  HCC)    . Arthropathy, unspecified, site unspecified    . Asthma    . Cancer (CMS HCC)     cervical   . Cataract    . Colon cancer (CMS [facility]) 07/06/2014   . COPD (chronic obstructive pulmonary disease) (CMS HCC)    . Fistula    . HTN (hypertension)    . Hyperlipidemia    . Obesity    . Sleep apnea    . Ventral hernia        Past Surgical History:   Procedure Laterality Date   . ABSCESS DRAINAGE     . BOWEL RESECTION     . CATARACT EXTRACTION     . HX CERVICAL CONE BIOPSY      . HX CESAREAN SECTION     . HX GASTRIC BYPASS      25 years ago   . [place] (x2)   . HX HYSTERECTOMY      late 1990s   . HX LAP BANDING      2013   . HX LAPAROTOMY      2013 to remove lap band       Family Medical History:     Problem Relation (Age of Onset)    Diabetes  Mother, Maternal Grandmother, Maternal Grandfather, Father    Heart Attack Mother    Stroke Father          Current Outpatient Medications   Medication Sig   . albuterol sulfate (PROAIR HFA) 90 mcg/actuation Inhalation HFA Aerosol Inhaler Take 1-2 Puffs by inhalation Every 6 hours as needed   . albuterol sulfate (PROVENTIL) 2.5 mg /3 mL (0.083 %) Inhalation Solution for Nebulization 3 mL (2.5 mg total) by Nebulization route Every 6 hours   . apixaban (ELIQUIS) 5 mg Oral Tablet Take 1 Tab (5 mg total) by mouth Twice daily   . atorvastatin (LIPITOR) 10 mg Oral Tablet Take 10 mg by mouth Once a day   . budesonide-formoteroL (SYMBICORT) 160-4.5 mcg/actuation Inhalation HFA Aerosol Inhaler Take 2 Puffs by inhalation Twice daily   . docusate sodium (COLACE) 100 mg Oral Capsule Take 100 mg by mouth Three times a day as needed    . flecainide (TAMBOCOR) 100 mg Oral Tablet Take 100 mg by mouth Twice daily   . fluticasone propionate (FLONASE) 50 mcg/actuation Nasal Spray, Suspension 1 Spray by Each Nostril route Once a day   . linaCLOtide (LINZESS) 72 mcg Oral Capsule Take 72 mcg by mouth Every morning   . lisinopriL (PRINIVIL) 20 mg Oral Tablet Take 20 mg by mouth Once a day   . loperamide (IMODIUM) 2 mg Oral Capsule Take 2 mg by mouth Every 4 hours as needed   . loratadine (CLARITIN) 10 mg Oral Tablet Take 1 Tab (10 mg total) by mouth Once a day   . magnesium oxide (MAG-OX) 400 mg Oral Tablet Take 400 mg by mouth Once a day   . omeprazole (PRILOSEC) 20 mg Oral Capsule, Delayed Release(E.C.) Take 40 mg by mouth Once a day    . oxyCODONE (ROXICODONE) 5 mg Oral Tablet Take 1 Tab (5 mg total) by mouth Every 6 hours as needed for Pain   . potassium chloride (K-DUR) 20 mEq Oral Tab Sust.Rel. Particle/Crystal Take 20 mEq by mouth Once a day   . prochlorperazine (COMPAZINE) 10 mg Oral Tablet Take 1 Tab (10 mg total) by mouth Four times a day as needed for Nausea/Vomiting   .  rOPINIRole (REQUIP) 0.5 mg Oral Tablet Take 0.5 mg by  mouth Every night     Social History     Socioeconomic History   . Marital status: Married     Spouse name: Not on file   . Number of children: Not on file   . Years of education: Not on file   . Highest education level: Not on file   Tobacco Use   . Smoking status: Former Research scientist (life sciences)   . Smokeless tobacco: Never Used   . Tobacco comment: quit 10 yrs ago   Substance and Sexual Activity   . Alcohol use: Not Currently     Comment: rarely   . Drug use: No   Other Topics Concern   . Ability to Walk 1 Flight of Steps without SOB/CP No   . Ability To Do Own ADL's Yes       PHYSICAL EXAMINATION:  VITALS:   Vitals:    12/16/18 1027   BP: (!) 96/52   Pulse: 74   Resp: 16   Temp: 36.6 C (97.9 F)   TempSrc: Tympanic   SpO2: 94%   Height: 1.575 m (5' 2" )       PHYSICAL EXAM:   Consitutional: No acute distress.    Eyes: EOMI. No discharge. No Jaundice.   ENT: mucous membranes dry. No posterior pharynx lesions.. Neck supple, No palpable masses   Heme/ Lymph: No Cervical, Inguinal, axillary lymh nodes. No Bruising.   Cardiovascular: S1, S2, No murmurs, rubs, or gallops.   Respiratory: Wheezes in bilateral lower lung fields.   Abdomen: Normal Bowel Sounds, nontender nondistended. No hepatosplenomegaly.   Musculoskeletal: No Edema to the extremities.   Skin: Normal turgor. No Rashes,skin lesions   Psychiatry: Normal Affect   Neuro: No focal deficits. Alert and Oriented x 3      ENCOUNTER ORDERS:  Orders Placed This Encounter   . CT CHEST WO IV CONTRAST   . albuterol sulfate (PROAIR HFA) 90 mcg/actuation Inhalation HFA Aerosol Inhaler   . budesonide-formoteroL (SYMBICORT) 160-4.5 mcg/actuation Inhalation HFA Aerosol Inhaler       LABORATORY/RADIOLOGICAL DATA: All pertinent labs/radiology were reviewed.       ASSESSMENT AND PLAN:  68 y.o. female with history of stage III colon cancer status post 5 cycle of FOLFOX now presenting with stage III lung cancer.  I explained the reason for referred to Hematology/Oncology Clinic.    ICD-10-CM       1. Malignant neoplasm of lung, unspecified laterality, unspecified part of lung (CMS HCC) C34.90 CT CHEST WO IV CONTRAST   2. Encounter for antineoplastic chemotherapy Z51.11    3. CINV (chemotherapy-induced nausea and vomiting) R11.2     T45.1X5A        1. Adenocarcinoma lung:  Stage III.    CT brain to rule out metastatic disease.  NEGATIVE.   Started on chemoradiation carboplatin/Taxol along with radiation- July 23, 2018.  After completion of chemo radiation patient will get PET scan.  If continued to have good response will start maintenance immunotherapy for 1 year.   Completed 6/6 cycles of Taxol/Carboplatin and will start maintenance immunotherapy therapy following radiation therapy and PET scan    CHEMOTHERAPY:/RADIATION  CYCLE 6/26 DAY   DRUG DOSE TOTAL DOSE % ROUTE SCHEDULE   Carboplatin AUC- 2 205 mg    COMPLETED   Paclitaxel 45 mg/m2 108 mg    COMPLETED   Durvalumab  Shared decision.  I discussed diagnosis, prognosis and treatment option with patient and family.  I had detailed discussion with patient regarding chemotherapy and radiation.  I also discussed brain scan to rule out metastatic disease before starting treatment.  I counseled patient regarding chemotherapy side effects and their management.  Patient understand and want to proceed with treatment.    - will get CT chest on next visit.      2. Neutropenia:  Due to chemotherapy.   Resolved.    3. CINV    Zofran PRN    4. Transaminitis: RESOLVED   Instructed to avoid tylenol/alcoholic beverages    5. Thrombocytopenia:  Resolved   platelets today 238    6. Dyspnea/fevers: RESOLVED   COVID negative    CT chest on 12/19/18 reveals Developing postradiation changes in the right lung with continued decrease in size of the right lower lobe lung mass and mediastinal lymph nodes.    7. Hypokalemia 3.   Will replace PO while here.  Instructed to take 20 mEq for 5 days     Pain   Patient reports chronic in nature.   Will  increase pain medication to every 6 hour instead of every 8 hours per Dr. Deatra Canter      Return in about 2 weeks (around 02/18/2019) for cbc/diff, CMP, Treatment.        Vanetta Mulders, MD  Hematology/Oncology

## 2019-02-06 ENCOUNTER — Encounter (INDEPENDENT_AMBULATORY_CARE_PROVIDER_SITE_OTHER): Payer: Self-pay | Admitting: Nurse Practitioner

## 2019-02-06 NOTE — Nursing Note (Signed)
Called patient to confirm date and time of diagnostic scan.  Patient confirmed. Mailed order.   Kelsey [name], [place]  02/06/2019, 14:45

## 2019-02-17 ENCOUNTER — Other Ambulatory Visit: Payer: Self-pay

## 2019-02-17 ENCOUNTER — Ambulatory Visit
Admission: RE | Admit: 2019-02-17 | Discharge: 2019-02-17 | Disposition: A | Discharge status: Home / Self Care | Payer: Medicare Other | Source: Ambulatory Visit | Attending: Internal Medicine | Admitting: Internal Medicine

## 2019-02-17 DIAGNOSIS — C3490 Malignant neoplasm of unspecified part of unspecified bronchus or lung: Secondary | ICD-10-CM | POA: Insufficient documentation

## 2019-02-17 DIAGNOSIS — J90 Pleural effusion, not elsewhere classified: Secondary | ICD-10-CM | POA: Insufficient documentation

## 2019-02-18 ENCOUNTER — Encounter (HOSPITAL_COMMUNITY): Payer: Self-pay | Admitting: Nurse Practitioner

## 2019-02-18 ENCOUNTER — Inpatient Hospital Stay (HOSPITAL_COMMUNITY): Payer: Medicare Other

## 2019-02-19 LAB — ECG W INTERP (AMB USE ONLY)(MUSE,IN CLINIC): QT Interval: 434 ms

## 2019-02-19 LAB — ECG W INTERP (CLINIC ONLY)
Atrial Rate: 77 {beats}/min
Calculated P Axis: 43 degrees
Calculated R Axis: 47 degrees
Calculated T Axis: 46 degrees
Ventricular rate: 77 {beats}/min

## 2019-02-25 ENCOUNTER — Ambulatory Visit (HOSPITAL_BASED_OUTPATIENT_CLINIC_OR_DEPARTMENT_OTHER): Payer: Medicare Other | Admitting: Nurse Practitioner

## 2019-02-25 ENCOUNTER — Ambulatory Visit (HOSPITAL_COMMUNITY)
Admission: RE | Admit: 2019-02-25 | Discharge: 2019-02-25 | Disposition: A | Discharge status: Home / Self Care | Payer: Medicare Other | Source: Ambulatory Visit | Attending: Internal Medicine | Admitting: Internal Medicine

## 2019-02-25 ENCOUNTER — Inpatient Hospital Stay
Admission: RE | Admit: 2019-02-25 | Discharge: 2019-02-25 | Disposition: A | Discharge status: Still In Hospital | Payer: Medicare Other | Source: Ambulatory Visit | Attending: Nurse Practitioner | Admitting: Nurse Practitioner

## 2019-02-25 ENCOUNTER — Encounter (HOSPITAL_COMMUNITY): Payer: Self-pay | Admitting: Nurse Practitioner

## 2019-02-25 ENCOUNTER — Other Ambulatory Visit: Payer: Self-pay

## 2019-02-25 VITALS — BP 187/81 | HR 64 | Temp 96.6°F | Resp 16 | Ht 62.0 in | Wt 274.0 lb

## 2019-02-25 DIAGNOSIS — R160 Hepatomegaly, not elsewhere classified: Secondary | ICD-10-CM | POA: Insufficient documentation

## 2019-02-25 DIAGNOSIS — C3490 Malignant neoplasm of unspecified part of unspecified bronchus or lung: Secondary | ICD-10-CM

## 2019-02-25 DIAGNOSIS — E039 Hypothyroidism, unspecified: Secondary | ICD-10-CM | POA: Insufficient documentation

## 2019-02-25 DIAGNOSIS — D6959 Other secondary thrombocytopenia: Secondary | ICD-10-CM

## 2019-02-25 DIAGNOSIS — J90 Pleural effusion, not elsewhere classified: Secondary | ICD-10-CM

## 2019-02-25 DIAGNOSIS — C3431 Malignant neoplasm of lower lobe, right bronchus or lung: Secondary | ICD-10-CM | POA: Insufficient documentation

## 2019-02-25 DIAGNOSIS — R059 Cough, unspecified: Secondary | ICD-10-CM

## 2019-02-25 DIAGNOSIS — Z5111 Encounter for antineoplastic chemotherapy: Secondary | ICD-10-CM

## 2019-02-25 DIAGNOSIS — R0600 Dyspnea, unspecified: Secondary | ICD-10-CM | POA: Insufficient documentation

## 2019-02-25 DIAGNOSIS — R52 Pain, unspecified: Secondary | ICD-10-CM

## 2019-02-25 DIAGNOSIS — C771 Secondary and unspecified malignant neoplasm of intrathoracic lymph nodes: Secondary | ICD-10-CM | POA: Insufficient documentation

## 2019-02-25 DIAGNOSIS — R197 Diarrhea, unspecified: Secondary | ICD-10-CM | POA: Insufficient documentation

## 2019-02-25 DIAGNOSIS — I4891 Unspecified atrial fibrillation: Secondary | ICD-10-CM | POA: Insufficient documentation

## 2019-02-25 DIAGNOSIS — T451X5A Adverse effect of antineoplastic and immunosuppressive drugs, initial encounter: Secondary | ICD-10-CM | POA: Insufficient documentation

## 2019-02-25 DIAGNOSIS — Z7901 Long term (current) use of anticoagulants: Secondary | ICD-10-CM | POA: Insufficient documentation

## 2019-02-25 DIAGNOSIS — D696 Thrombocytopenia, unspecified: Secondary | ICD-10-CM | POA: Insufficient documentation

## 2019-02-25 DIAGNOSIS — Z85038 Personal history of other malignant neoplasm of large intestine: Secondary | ICD-10-CM | POA: Insufficient documentation

## 2019-02-25 DIAGNOSIS — D649 Anemia, unspecified: Secondary | ICD-10-CM | POA: Insufficient documentation

## 2019-02-25 DIAGNOSIS — R7401 Elevation of levels of liver transaminase levels: Secondary | ICD-10-CM

## 2019-02-25 DIAGNOSIS — G4730 Sleep apnea, unspecified: Secondary | ICD-10-CM | POA: Insufficient documentation

## 2019-02-25 DIAGNOSIS — R112 Nausea with vomiting, unspecified: Secondary | ICD-10-CM | POA: Insufficient documentation

## 2019-02-25 DIAGNOSIS — I10 Essential (primary) hypertension: Secondary | ICD-10-CM | POA: Insufficient documentation

## 2019-02-25 DIAGNOSIS — I480 Paroxysmal atrial fibrillation: Secondary | ICD-10-CM | POA: Insufficient documentation

## 2019-02-25 DIAGNOSIS — C7801 Secondary malignant neoplasm of right lung: Secondary | ICD-10-CM | POA: Insufficient documentation

## 2019-02-25 DIAGNOSIS — R05 Cough: Secondary | ICD-10-CM | POA: Insufficient documentation

## 2019-02-25 DIAGNOSIS — K760 Fatty (change of) liver, not elsewhere classified: Secondary | ICD-10-CM | POA: Insufficient documentation

## 2019-02-25 DIAGNOSIS — F39 Unspecified mood [affective] disorder: Secondary | ICD-10-CM | POA: Insufficient documentation

## 2019-02-25 DIAGNOSIS — M549 Dorsalgia, unspecified: Secondary | ICD-10-CM | POA: Insufficient documentation

## 2019-02-25 DIAGNOSIS — R740 Nonspecific elevation of levels of transaminase and lactic acid dehydrogenase [LDH]: Secondary | ICD-10-CM

## 2019-02-25 DIAGNOSIS — R0609 Other forms of dyspnea: Secondary | ICD-10-CM

## 2019-02-25 DIAGNOSIS — K219 Gastro-esophageal reflux disease without esophagitis: Secondary | ICD-10-CM | POA: Insufficient documentation

## 2019-02-25 DIAGNOSIS — E785 Hyperlipidemia, unspecified: Secondary | ICD-10-CM | POA: Insufficient documentation

## 2019-02-25 DIAGNOSIS — G2581 Restless legs syndrome: Secondary | ICD-10-CM | POA: Insufficient documentation

## 2019-02-25 DIAGNOSIS — Z87891 Personal history of nicotine dependence: Secondary | ICD-10-CM | POA: Insufficient documentation

## 2019-02-25 DIAGNOSIS — Z5112 Encounter for antineoplastic immunotherapy: Secondary | ICD-10-CM | POA: Insufficient documentation

## 2019-02-25 DIAGNOSIS — D702 Other drug-induced agranulocytosis: Secondary | ICD-10-CM | POA: Insufficient documentation

## 2019-02-25 DIAGNOSIS — Z8541 Personal history of malignant neoplasm of cervix uteri: Secondary | ICD-10-CM | POA: Insufficient documentation

## 2019-02-25 DIAGNOSIS — Z9884 Bariatric surgery status: Secondary | ICD-10-CM | POA: Insufficient documentation

## 2019-02-25 DIAGNOSIS — E278 Other specified disorders of adrenal gland: Secondary | ICD-10-CM | POA: Insufficient documentation

## 2019-02-25 DIAGNOSIS — J449 Chronic obstructive pulmonary disease, unspecified: Secondary | ICD-10-CM | POA: Insufficient documentation

## 2019-02-25 LAB — CBC WITH DIFF
BASOPHIL #: 0 10*3/uL (ref 0.00–0.20)
BASOPHIL %: 1 %
EOSINOPHIL #: 0.1 10*3/uL (ref 0.00–0.50)
HCT: 35.8 % (ref 34.6–46.2)
HGB: 11.6 g/dL — ABNORMAL LOW (ref 11.8–15.8)
LYMPHOCYTE #: 0.7 10*3/uL — ABNORMAL LOW (ref 0.90–3.40)
LYMPHOCYTE %: 12 %
MCH: 24.6 pg — ABNORMAL LOW (ref 27.6–33.2)
MCHC: 32.4 g/dL — ABNORMAL LOW (ref 32.6–35.4)
MCV: 76 fL — ABNORMAL LOW (ref 82.3–96.7)
MONOCYTE #: 0.7 10*3/uL (ref 0.20–0.90)
MONOCYTE %: 12 %
MPV: 7.6 fL (ref 6.6–10.2)
NEUTROPHIL #: 4.1 10*3/uL (ref 1.50–6.40)
NEUTROPHIL %: 74 %
PLATELETS: 223 10*3/uL (ref 140–440)
RBC: 4.71 10*6/uL (ref 3.80–5.24)
RDW: 19.2 % — ABNORMAL HIGH (ref 12.4–15.2)
WBC: 5.6 10*3/uL (ref 3.5–10.3)

## 2019-02-25 LAB — COMPREHENSIVE METABOLIC PANEL, NON-FASTING
ALBUMIN: 3.4 g/dL (ref 3.2–4.6)
ALKALINE PHOSPHATASE: 91 U/L (ref 20–130)
ALT (SGPT): 17 U/L (ref <=52)
ANION GAP: 10 mmol/L
AST (SGOT): 26 U/L (ref <=35)
BILIRUBIN TOTAL: 0.6 mg/dL (ref 0.3–1.2)
BUN/CREA RATIO: 12
BUN: 9 mg/dL — ABNORMAL LOW (ref 10–25)
CALCIUM: 8.3 mg/dL — ABNORMAL LOW (ref 8.8–10.3)
CHLORIDE: 105 mmol/L (ref 98–111)
CO2 TOTAL: 23 mmol/L (ref 21–35)
CREATININE: 0.78 mg/dL (ref <=1.30)
ESTIMATED GFR: >60 mL/min/{1.73_m2}
GLUCOSE: 119 mg/dL — ABNORMAL HIGH (ref 70–110)
POTASSIUM: 3.4 mmol/L — ABNORMAL LOW (ref 3.5–5.0)
PROTEIN TOTAL: 6 g/dL (ref 6.0–8.3)
SODIUM: 138 mmol/L (ref 135–145)

## 2019-02-25 LAB — THYROID STIMULATING HORMONE (SENSITIVE TSH): TSH: 12.799 u[IU]/mL — ABNORMAL HIGH (ref 0.450–5.330)

## 2019-02-25 MED ORDER — CALCIUM 200 MG (AS CALCIUM CARBONATE 500 MG) CHEWABLE TABLET
1000.0000 mg | CHEWABLE_TABLET | Freq: Every day | ORAL | Status: DC
Start: 2019-02-25 — End: 2019-02-26
  Administered 2019-02-25: 11:00:00 1000 mg via ORAL
  Filled 2019-02-25: qty 2

## 2019-02-25 MED ORDER — SODIUM CHLORIDE 0.9 % INTRAVENOUS SOLUTION
10.0000 mg/kg | Freq: Once | INTRAVENOUS | Status: AC
Start: 2019-02-25 — End: 2019-02-25
  Administered 2019-02-25: 1250 mg via INTRAVENOUS
  Administered 2019-02-25: 12:00:00 0 mg via INTRAVENOUS
  Filled 2019-02-25: qty 25

## 2019-02-25 NOTE — Progress Notes (Signed)
[place]  [place] [address]        Encounter Date: 02/25/2019   9:30 AM EDT    Name:  Kelsey [name]  Age: 68 y.o.  DOB: 1951/02/22  Sex: female  PCP: [facility]    Chief Complaint:    Chief Complaint   Patient presents with   . Treatment     HISTORY OF PRESENT ILLNESS:   68 y.o. female with history of stage III colon cancer of present evaluation and management of lung cancer.    1. September 2015. Patient underwent sigmoidoscopy and was found to have polyp which was adenocarcinoma.    2. June 10, 2014. She was admitted for segmental resection of sigmoid colon. This revealed pT3, N1a, MX, low-grade  adenocarcinoma of the colon. The mass measuring 4 x 3 x 0.5 centimeters. It  was low-grade and there was no evidence of microsatellite instability by  histology. Margins were uninvolved and 4 lymph nodes only were removed which  only 1 was involved. This corresponding stage IIIB, T3, N1a, M0, low-grade  sigmoid cancer.     3. July 07, 2014. Recommended adjuvant chemotherapy with FOLFOX.    4. July 27, 2014. Patient was started on adjuvant chemotherapy with FOLFOX.  Patient received only 5 treatments and chemotherapy was stopped due to poor tolerance.  After that patient lost follow-up.      5. October 2019. Patient was admitted to hospital with concern for  bowel obstruction.  CT chest showed lung nodules.  Patient was managed conservatively and discharged home.    6. May 06, 2018.  Patient underwent CT-guided biopsy of lung lesion.  Pathology showed well-differentiated adenocarcinoma.  Section shows well-differentiated adenocarcinoma which positive for CK 7 and TTF 1, negative for P 63 and CK 20.     7. June 23, 2018. PET scan showed Hypermetabolic right lower lobe pulmonary mass compatible with malignancy.  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 Hepatomegaly and  steatosis.     8. July 23, 2018. Patient was started on chemoradiation.    9. October 30, 2018. PET scan showed Hypermetabolic right lower lobe mass with abnormal mediastinal and hilar nodes. There has been only mild improvement since earlier exam.    10. October 31, 2018. Patient was started on maintenance immunotherapy with durvalumab.    11. February 17, 2019. CT chest revealedModerate right effusion new from 12/19/2018. No change in the right lower lung mass or a presumed postradiation changes in the right upper lung when compared to 12/19/2018. Mass in the right cardiophrenic angle increased from 10/30/2019 6). Consider an enlarged lymph node here. This is unchanged from 12/19/2018 but increased since size from 10/30/2018. Another possibility is an enlarging pericardial cyst (this region was not hypermetabolic 05/10/1940). Mild enlargement of the left adrenal gland stable from 10/30/2018. Approximate 9 cm upper abdominal ventral hernia containing nonobstructed bowel loops unchanged    SUBJECTIVE:  Patient presents for follow-up and treatment.  Patient reports she does have some a increased shortness of breath especially with exertion.  Patient reports she still has a dry cough.  Patient denies any headache, blurred vision, dizziness.  Patient reports baseline diarrhea.  Patient denies any abdominal  pain.  Patient denies any immune related symptoms.       REVIEW OF SYSTEMS:  GENERAL:   Denies fever or chills   HEENT: no sore throat, congestion, blurry vision  Lungs:  Baseline shortness of breath  GI: no nausea, vomiting, constipation, no diarrhea  GU: No dysuria, urgency, increased frequency   Cardiac: no chest pains, palpitations, syncope,   Neuro: no weakness, loss of function, numbness, tingling  Skin: no rash  Musculoskeletal: No myalgias  Psychosocial: No depression, anxiety  Hematologic: No easy bruising, abnormal bleeding  All other review ROS negative except those mentioned above.     PATIENT HISTORY:  Past Medical  History:   Diagnosis Date   . A-fib (CMS HCC)    . Arthropathy, unspecified, site unspecified    . Asthma    . Cancer (CMS HCC)     cervical   . Cataract    . Colon cancer (CMS [facility]) 07/06/2014   . COPD (chronic obstructive pulmonary disease) (CMS HCC)    . Fistula    . HTN (hypertension)    . Hyperlipidemia    . Obesity    . Sleep apnea    . Ventral hernia        Past Surgical History:   Procedure Laterality Date   . ABSCESS DRAINAGE     . BOWEL RESECTION     . CATARACT EXTRACTION     . HX CERVICAL CONE BIOPSY      . HX CESAREAN SECTION     . HX GASTRIC BYPASS      25 years ago   . [place] (x2)   . HX HYSTERECTOMY      late 1990s   . HX LAP BANDING      2013   . HX LAPAROTOMY      2013 to remove lap band       Family Medical History:     Problem Relation (Age of Onset)    Diabetes Mother, Maternal Grandmother, Maternal Grandfather, Father    Heart Attack Mother    Stroke Father          Current Outpatient Medications   Medication Sig   . albuterol sulfate (PROAIR HFA) 90 mcg/actuation Inhalation HFA Aerosol Inhaler Take 1-2 Puffs by inhalation Every 6 hours as needed   . albuterol sulfate (PROVENTIL) 2.5 mg /3 mL (0.083 %) Inhalation Solution for Nebulization 3 mL (2.5 mg total) by Nebulization route Every 6 hours   . apixaban (ELIQUIS) 5 mg Oral Tablet Take 1 Tab (5 mg total) by mouth Twice daily   . atorvastatin (LIPITOR) 10 mg Oral Tablet Take 10 mg by mouth Once a day   . budesonide-formoteroL (SYMBICORT) 160-4.5 mcg/actuation Inhalation HFA Aerosol Inhaler Take 2 Puffs by inhalation Twice daily   . docusate sodium (COLACE) 100 mg Oral Capsule Take 100 mg by mouth Three times a day as needed    . flecainide (TAMBOCOR) 100 mg Oral Tablet Take 100 mg by mouth Twice daily   . fluticasone propionate (FLONASE) 50 mcg/actuation Nasal Spray, Suspension 1 Spray by Each Nostril route Once a day   . linaCLOtide (LINZESS) 72 mcg Oral Capsule Take 72 mcg by mouth Every morning   . lisinopriL  (PRINIVIL) 20 mg Oral Tablet Take 20 mg by mouth Once a day   . loperamide (IMODIUM) 2 mg Oral Capsule Take 2 mg by mouth Every 4 hours as needed   .  loratadine (CLARITIN) 10 mg Oral Tablet Take 1 Tab (10 mg total) by mouth Once a day   . magnesium oxide (MAG-OX) 400 mg Oral Tablet Take 400 mg by mouth Once a day   . omeprazole (PRILOSEC) 20 mg Oral Capsule, Delayed Release(E.C.) Take 40 mg by mouth Once a day    . oxyCODONE (ROXICODONE) 5 mg Oral Tablet Take 1 Tab (5 mg total) by mouth Every 6 hours as needed for Pain   . potassium chloride (K-DUR) 20 mEq Oral Tab Sust.Rel. Particle/Crystal Take 20 mEq by mouth Once a day   . prochlorperazine (COMPAZINE) 10 mg Oral Tablet Take 1 Tab (10 mg total) by mouth Four times a day as needed for Nausea/Vomiting   . rOPINIRole (REQUIP) 0.5 mg Oral Tablet Take 0.5 mg by mouth Every night     Social History     Socioeconomic History   . Marital status: Married     Spouse name: Not on file   . Number of children: Not on file   . Years of education: Not on file   . Highest education level: Not on file   Tobacco Use   . Smoking status: Former Research scientist (life sciences)   . Smokeless tobacco: Never Used   . Tobacco comment: quit 10 yrs ago   Substance and Sexual Activity   . Alcohol use: Not Currently     Comment: rarely   . Drug use: No   Other Topics Concern   . Ability to Walk 1 Flight of Steps without SOB/CP No   . Ability To Do Own ADL's Yes       PHYSICAL EXAMINATION:  VITALS:   Vitals:    12/16/18 1027   BP: (!)136/82   Pulse: 74   Resp: 16   Temp: 36.6 C (97.9 F)   TempSrc: Tympanic   SpO2: 94%   Height: 1.575 m (5' 2" )       PHYSICAL EXAM:   Consitutional: No acute distress.    Eyes: EOMI. No discharge. No Jaundice.   ENT: mucous membranes dry. No posterior pharynx lesions.. Neck supple, No palpable masses   Heme/ Lymph: No Cervical, Inguinal, axillary lymh nodes. No Bruising.   Cardiovascular: S1, S2, No murmurs, rubs, or gallops.   Respiratory: Wheezes in bilateral lower lung fields.      Abdomen: Normal Bowel Sounds, nontender nondistended. No hepatosplenomegaly.   Musculoskeletal: No Edema to the extremities.   Skin: Normal turgor. No Rashes,skin lesions   Psychiatry: Normal Affect   Neuro: No focal deficits. Alert and Oriented x 3      ENCOUNTER ORDERS:  Orders Placed This Encounter   . [place] PET GS:UPJS (HEAD TO THIGH) WO IV CONTRAST   . US THORACENTESIS   . ADDITIONAL TESTING  REQUEST   . THYROID STIMULATING HORMONE (SENSITIVE TSH)       LABORATORY/RADIOLOGICAL DATA: All pertinent labs/radiology were reviewed.      Ref. Range 02/25/2019 09:27   WBC Latest Ref Range: 3.5 - 10.3 x10^3/uL 5.6   HGB Latest Ref Range: 11.8 - 15.8 g/dL 11.6 (L)   HCT Latest Ref Range: 34.6 - 46.2 % 35.8   PLATELET COUNT Latest Ref Range: 140 - 440 x10^3/uL 223       ASSESSMENT AND PLAN:  68 y.o. female with history of stage III colon cancer status post 5 cycle of FOLFOX now presenting with stage III lung cancer.  I explained the reason for referred to Hematology/Oncology Clinic.    ICD-10-CM  1. Anemia, unspecified type D64.9 ADDITIONAL TESTING  REQUEST     THYROID STIMULATING HORMONE (SENSITIVE TSH)     [place] PET GS:UPJS (HEAD TO THIGH) WO IV CONTRAST   2. Malignant neoplasm of lung, unspecified laterality, unspecified part of lung (CMS HCC) C34.90 [place] PET RP:RXYV (HEAD TO THIGH) WO IV CONTRAST   3. Pleural effusion J90 US THORACENTESIS   4. Encounter for antineoplastic chemotherapy Z51.11    5. CINV (chemotherapy-induced nausea and vomiting) R11.2     T45.1X5A    6. Dyspnea, unspecified type R06.00    7. Pain R52    8. Dyspnea on exertion R06.09    9. Diarrhea, unspecified type R19.7    10. Transaminitis R74.0    11. Chemotherapy induced nausea and vomiting R11.2     T45.1X5A    12. Back pain, unspecified back location, unspecified back pain laterality, unspecified chronicity M54.9    13. Cough R05    14. Chemotherapy-induced thrombocytopenia D69.59     T45.1X5A    15. Hypothyroidism, unspecified type E03.9        1.  Adenocarcinoma lung:  Stage III.    CT brain to rule out metastatic disease.  NEGATIVE.   Started on chemoradiation carboplatin/Taxol along with radiation- July 23, 2018.  After completion of chemo radiation patient will get PET scan.  If continued to have good response will start maintenance immunotherapy for 1 year.   Completed 6/6 cycles of Taxol/Carboplatin and will start maintenance immunotherapy therapy following radiation therapy and PET scan    CHEMOTHERAPY:/RADIATION  CYCLE 8/26 DAY   DRUG DOSE TOTAL DOSE % ROUTE SCHEDULE   Carboplatin AUC- 2 205 mg    COMPLETED   Paclitaxel 45 mg/m2 108 mg    COMPLETED   Durvalumab                  Shared decision.  I discussed diagnosis, prognosis and treatment option with patient and family.  I had detailed discussion with patient regarding chemotherapy and radiation.  I also discussed brain scan to rule out metastatic disease before starting treatment.  I counseled patient regarding chemotherapy side effects and their management.  Patient understand and want to proceed with treatment.    - Ordered PET/CT to rule out progression of malignancy    2. Neutropenia:  Due to chemotherapy.   Resolved.    3. CINV    Zofran PRN    4. Transaminitis: RESOLVED   Instructed to avoid tylenol/alcoholic beverages    5. Thrombocytopenia:  Resolved    6. Dyspnea/fevers: RESOLVED   COVID negative    CT chest on 12/19/18 reveals Developing postradiation changes in the right lung with continued decrease in size of the right lower lobe lung mass and mediastinal lymph nodes.    7. Hypokalemia 3.4   Take 3 days of 20 meq K+    8. Pain   Patient reports chronic in nature.   Will increase pain medication to every 6 hour instead of every 8 hours per Dr. Deatra Canter    9. Hypothyroidism   Elevated TSH will discuss synthroid @ next appointment      Return in about 2 weeks (around 03/11/2019) for cbc/diff, CMP, EXAM, Treatment.      Omer Jack, APRN, FNP-C      The patient was seen  independently by the Nurse Practitioner. I reviewed the patient's chart and agree with the NP's plan.     Vanetta Mulders, MD  Hematology/Oncology

## 2019-02-25 NOTE — Nurses Notes (Signed)
1112- port flushed with 10cc NS +br. imfinzi started at a 1 hour rate. Tums given po. Lilyan Gilford, RN  [phone]- infusion complete. Port flushed with 10cc NS +br and 5cc heparin. Port deaccessed, band-aid applied to site. Pt tol well, no complaints. Pt d/c wheelchir. Lilyan Gilford, RN

## 2019-02-26 ENCOUNTER — Encounter (HOSPITAL_COMMUNITY): Payer: Self-pay | Admitting: Internal Medicine

## 2019-02-26 ENCOUNTER — Encounter (HOSPITAL_COMMUNITY): Payer: Self-pay

## 2019-02-26 ENCOUNTER — Other Ambulatory Visit (HOSPITAL_COMMUNITY): Payer: Self-pay | Admitting: Internal Medicine

## 2019-02-27 ENCOUNTER — Encounter (HOSPITAL_COMMUNITY): Payer: Self-pay | Admitting: Nurse Practitioner

## 2019-02-27 NOTE — Nursing Note (Signed)
US Thoracentesis scheduled on 03/02/2019 for 8:15 am. Instructed patient to arrive @ 2 HOURS PRIOR am on the second floor of [facility]. Instructed patient to have a driver, nothing to eat or drink after midnight, & no blood thinners 5 days prior. Confirmed appointment date/time/location with patient verbally. Answered any questions patient may have. Patient stopped Eliquis on the 8th of July.  Edwyna Shell, MA  02/27/2019, 08:24

## 2019-02-28 LAB — HISTORICAL SURGICAL PATHOLOGY SPECIMEN

## 2019-03-01 LAB — HISTORICAL SURGICAL PATHOLOGY SPECIMEN

## 2019-03-02 ENCOUNTER — Ambulatory Visit
Admission: RE | Admit: 2019-03-02 | Discharge: 2019-03-02 | Disposition: A | Discharge status: Home / Self Care | Payer: Medicare Other | Source: Ambulatory Visit | Attending: Internal Medicine | Admitting: Internal Medicine

## 2019-03-02 ENCOUNTER — Ambulatory Visit (HOSPITAL_COMMUNITY)
Admission: RE | Admit: 2019-03-02 | Discharge: 2019-03-02 | Disposition: A | Discharge status: Home / Self Care | Payer: Medicare Other | Source: Ambulatory Visit | Attending: Internal Medicine | Admitting: Internal Medicine

## 2019-03-02 ENCOUNTER — Encounter (HOSPITAL_COMMUNITY): Payer: Self-pay

## 2019-03-02 ENCOUNTER — Other Ambulatory Visit: Payer: Self-pay

## 2019-03-02 ENCOUNTER — Other Ambulatory Visit (HOSPITAL_COMMUNITY): Payer: Self-pay | Admitting: Nurse Practitioner

## 2019-03-02 ENCOUNTER — Telehealth (HOSPITAL_COMMUNITY): Payer: Self-pay | Admitting: Nurse Practitioner

## 2019-03-02 DIAGNOSIS — J90 Pleural effusion, not elsewhere classified: Secondary | ICD-10-CM

## 2019-03-02 HISTORY — DX: Other nonspecific abnormal finding of lung field: R91.8

## 2019-03-02 NOTE — Nurses Notes (Signed)
COVID-19 Admission Screen    Low Risk:  None    Moderate/High Risk: {None    Test Result:Not Indicated

## 2019-03-02 NOTE — Telephone Encounter (Signed)
Patients husband called and has questions regarding thoracentesis

## 2019-03-06 ENCOUNTER — Ambulatory Visit
Admission: RE | Admit: 2019-03-06 | Discharge: 2019-03-06 | Disposition: A | Discharge status: Home / Self Care | Payer: Medicare Other | Source: Ambulatory Visit | Attending: Nurse Practitioner | Admitting: Nurse Practitioner

## 2019-03-06 ENCOUNTER — Other Ambulatory Visit: Payer: Self-pay

## 2019-03-06 DIAGNOSIS — D649 Anemia, unspecified: Secondary | ICD-10-CM

## 2019-03-06 DIAGNOSIS — C3490 Malignant neoplasm of unspecified part of unspecified bronchus or lung: Secondary | ICD-10-CM

## 2019-03-06 DIAGNOSIS — J90 Pleural effusion, not elsewhere classified: Secondary | ICD-10-CM | POA: Insufficient documentation

## 2019-03-06 DIAGNOSIS — C3491 Malignant neoplasm of unspecified part of right bronchus or lung: Secondary | ICD-10-CM | POA: Insufficient documentation

## 2019-03-06 LAB — POC BLOOD GLUCOSE (RESULTS): GLUCOSE, POC: 94 mg/dL (ref 70–110)

## 2019-03-11 ENCOUNTER — Ambulatory Visit (HOSPITAL_COMMUNITY)
Admission: RE | Admit: 2019-03-11 | Discharge: 2019-03-11 | Disposition: A | Discharge status: Home / Self Care | Payer: Medicare Other | Source: Ambulatory Visit

## 2019-03-11 ENCOUNTER — Inpatient Hospital Stay (HOSPITAL_COMMUNITY): Payer: Self-pay

## 2019-03-11 ENCOUNTER — Inpatient Hospital Stay (HOSPITAL_COMMUNITY): Payer: Medicare Other

## 2019-03-11 ENCOUNTER — Other Ambulatory Visit: Payer: Self-pay

## 2019-03-11 ENCOUNTER — Encounter (HOSPITAL_COMMUNITY): Payer: Self-pay

## 2019-03-11 ENCOUNTER — Other Ambulatory Visit (HOSPITAL_COMMUNITY): Payer: Self-pay | Admitting: Nurse Practitioner

## 2019-03-11 ENCOUNTER — Ambulatory Visit (HOSPITAL_COMMUNITY): Admission: RE | Admit: 2019-03-11 | Payer: Medicare Other | Source: Ambulatory Visit

## 2019-03-11 ENCOUNTER — Ambulatory Visit (HOSPITAL_BASED_OUTPATIENT_CLINIC_OR_DEPARTMENT_OTHER): Payer: Medicare Other | Admitting: Nurse Practitioner

## 2019-03-11 ENCOUNTER — Encounter (HOSPITAL_COMMUNITY): Payer: Self-pay | Admitting: Nurse Practitioner

## 2019-03-11 ENCOUNTER — Ambulatory Visit (HOSPITAL_COMMUNITY)
Admission: RE | Admit: 2019-03-11 | Discharge: 2019-03-11 | Disposition: A | Discharge status: Home / Self Care | Payer: Medicare Other | Source: Ambulatory Visit | Attending: Nurse Practitioner | Admitting: Nurse Practitioner

## 2019-03-11 ENCOUNTER — Observation Stay
Admission: EM | Admit: 2019-03-11 | Discharge: 2019-03-12 | Disposition: A | Discharge status: Home / Self Care | Payer: Medicare Other | Attending: Internal Medicine | Admitting: Internal Medicine

## 2019-03-11 VITALS — BP 137/68 | HR 84 | Temp 95.9°F | Resp 16 | Ht 62.0 in | Wt 267.0 lb

## 2019-03-11 DIAGNOSIS — Z87891 Personal history of nicotine dependence: Secondary | ICD-10-CM

## 2019-03-11 DIAGNOSIS — R112 Nausea with vomiting, unspecified: Secondary | ICD-10-CM

## 2019-03-11 DIAGNOSIS — R059 Cough, unspecified: Secondary | ICD-10-CM

## 2019-03-11 DIAGNOSIS — E785 Hyperlipidemia, unspecified: Secondary | ICD-10-CM | POA: Insufficient documentation

## 2019-03-11 DIAGNOSIS — R079 Chest pain, unspecified: Secondary | ICD-10-CM

## 2019-03-11 DIAGNOSIS — C3490 Malignant neoplasm of unspecified part of unspecified bronchus or lung: Secondary | ICD-10-CM | POA: Insufficient documentation

## 2019-03-11 DIAGNOSIS — R740 Nonspecific elevation of levels of transaminase and lactic acid dehydrogenase [LDH]: Secondary | ICD-10-CM

## 2019-03-11 DIAGNOSIS — R7989 Other specified abnormal findings of blood chemistry: Secondary | ICD-10-CM

## 2019-03-11 DIAGNOSIS — M549 Dorsalgia, unspecified: Secondary | ICD-10-CM

## 2019-03-11 DIAGNOSIS — E039 Hypothyroidism, unspecified: Secondary | ICD-10-CM

## 2019-03-11 DIAGNOSIS — R946 Abnormal results of thyroid function studies: Secondary | ICD-10-CM | POA: Insufficient documentation

## 2019-03-11 DIAGNOSIS — R05 Cough: Secondary | ICD-10-CM

## 2019-03-11 DIAGNOSIS — D649 Anemia, unspecified: Secondary | ICD-10-CM

## 2019-03-11 DIAGNOSIS — R0609 Other forms of dyspnea: Secondary | ICD-10-CM | POA: Insufficient documentation

## 2019-03-11 DIAGNOSIS — R52 Pain, unspecified: Secondary | ICD-10-CM | POA: Insufficient documentation

## 2019-03-11 DIAGNOSIS — I209 Angina pectoris, unspecified: Secondary | ICD-10-CM

## 2019-03-11 DIAGNOSIS — J90 Pleural effusion, not elsewhere classified: Secondary | ICD-10-CM | POA: Insufficient documentation

## 2019-03-11 DIAGNOSIS — R197 Diarrhea, unspecified: Secondary | ICD-10-CM | POA: Insufficient documentation

## 2019-03-11 DIAGNOSIS — R0600 Dyspnea, unspecified: Secondary | ICD-10-CM | POA: Insufficient documentation

## 2019-03-11 DIAGNOSIS — R9431 Abnormal electrocardiogram [ECG] [EKG]: Secondary | ICD-10-CM

## 2019-03-11 DIAGNOSIS — I480 Paroxysmal atrial fibrillation: Secondary | ICD-10-CM | POA: Insufficient documentation

## 2019-03-11 DIAGNOSIS — Z79899 Other long term (current) drug therapy: Secondary | ICD-10-CM | POA: Insufficient documentation

## 2019-03-11 DIAGNOSIS — T451X5A Adverse effect of antineoplastic and immunosuppressive drugs, initial encounter: Secondary | ICD-10-CM

## 2019-03-11 DIAGNOSIS — J449 Chronic obstructive pulmonary disease, unspecified: Secondary | ICD-10-CM | POA: Insufficient documentation

## 2019-03-11 DIAGNOSIS — R7401 Elevation of levels of liver transaminase levels: Secondary | ICD-10-CM

## 2019-03-11 DIAGNOSIS — Z7901 Long term (current) use of anticoagulants: Secondary | ICD-10-CM | POA: Insufficient documentation

## 2019-03-11 DIAGNOSIS — I10 Essential (primary) hypertension: Secondary | ICD-10-CM

## 2019-03-11 DIAGNOSIS — Z5111 Encounter for antineoplastic chemotherapy: Secondary | ICD-10-CM

## 2019-03-11 LAB — CBC WITH DIFF
BASOPHIL #: 0 10*3/uL (ref 0.00–0.20)
BASOPHIL #: 0 10*3/uL (ref 0.00–0.20)
BASOPHIL %: 1 %
BASOPHIL %: 1 %
EOSINOPHIL #: 0.1 10*3/uL (ref 0.00–0.50)
EOSINOPHIL #: 0.1 10*3/uL (ref 0.00–0.50)
EOSINOPHIL %: 2 %
EOSINOPHIL %: 1 %
HCT: 36.4 % (ref 34.6–46.2)
HCT: 37.1 % (ref 34.6–46.2)
HGB: 11.8 g/dL (ref 11.8–15.8)
HGB: 12 g/dL (ref 11.8–15.8)
LYMPHOCYTE #: 0.6 10*3/uL — ABNORMAL LOW (ref 0.90–3.40)
LYMPHOCYTE #: 0.8 10*3/uL — ABNORMAL LOW (ref 0.90–3.40)
LYMPHOCYTE %: 11 %
LYMPHOCYTE %: 12 %
MCH: 24.5 pg — ABNORMAL LOW (ref 27.6–33.2)
MCH: 24.2 pg — ABNORMAL LOW (ref 27.6–33.2)
MCHC: 32.5 g/dL — ABNORMAL LOW (ref 32.6–35.4)
MCHC: 32.4 g/dL — ABNORMAL LOW (ref 32.6–35.4)
MCV: 75.2 fL — ABNORMAL LOW (ref 82.3–96.7)
MCV: 74.5 fL — ABNORMAL LOW (ref 82.3–96.7)
MONOCYTE #: 0.6 10*3/uL (ref 0.20–0.90)
MONOCYTE #: 0.7 10*3/uL (ref 0.20–0.90)
MONOCYTE %: 12 %
MONOCYTE %: 11 %
MPV: 7.9 fL (ref 6.6–10.2)
MPV: 8.8 fL (ref 6.6–10.2)
NEUTROPHIL #: 3.9 10*3/uL (ref 1.50–6.40)
NEUTROPHIL #: 4.8 10*3/uL (ref 1.50–6.40)
NEUTROPHIL %: 75 %
NEUTROPHIL %: 75 %
PLATELETS: 207 10*3/uL (ref 140–440)
PLATELETS: 197 10*3/uL (ref 140–440)
RBC: 4.84 10*6/uL (ref 3.80–5.24)
RBC: 4.99 10*6/uL (ref 3.80–5.24)
RDW: 18.8 % — ABNORMAL HIGH (ref 12.4–15.2)
RDW: 18.8 % — ABNORMAL HIGH (ref 12.4–15.2)
WBC: 5.2 10*3/uL (ref 3.5–10.3)
WBC: 6.5 10*3/uL (ref 3.5–10.3)

## 2019-03-11 LAB — COMPREHENSIVE METABOLIC PANEL, NON-FASTING
ALBUMIN: 3.4 g/dL (ref 3.2–4.6)
ALKALINE PHOSPHATASE: 91 U/L (ref 20–130)
ALT (SGPT): 15 U/L (ref <=52)
ANION GAP: 8 mmol/L
AST (SGOT): 21 U/L (ref <=35)
BILIRUBIN TOTAL: 0.6 mg/dL (ref 0.3–1.2)
BUN/CREA RATIO: 13
BUN: 12 mg/dL (ref 10–25)
CALCIUM: 8.6 mg/dL — ABNORMAL LOW (ref 8.8–10.3)
CHLORIDE: 103 mmol/L (ref 98–111)
CO2 TOTAL: 26 mmol/L (ref 21–35)
CREATININE: 0.9 mg/dL (ref <=1.30)
ESTIMATED GFR: >60 mL/min/{1.73_m2}
GLUCOSE: 129 mg/dL — ABNORMAL HIGH (ref 70–110)
POTASSIUM: 3.3 mmol/L — ABNORMAL LOW (ref 3.5–5.0)
PROTEIN TOTAL: 5.8 g/dL — ABNORMAL LOW (ref 6.0–8.3)
SODIUM: 137 mmol/L (ref 135–145)

## 2019-03-11 LAB — BASIC METABOLIC PANEL
ANION GAP: 7 mmol/L
BUN/CREA RATIO: 15
BUN: 13 mg/dL (ref 10–25)
CALCIUM: 8.6 mg/dL — ABNORMAL LOW (ref 8.8–10.3)
CHLORIDE: 103 mmol/L (ref 98–111)
CO2 TOTAL: 27 mmol/L (ref 21–35)
CREATININE: 0.88 mg/dL (ref <=1.30)
ESTIMATED GFR: >60 mL/min/{1.73_m2}
GLUCOSE: 108 mg/dL (ref 70–110)
POTASSIUM: 3.4 mmol/L — ABNORMAL LOW (ref 3.5–5.0)
SODIUM: 137 mmol/L (ref 135–145)

## 2019-03-11 LAB — PT/INR: INR: 1.4 — ABNORMAL HIGH (ref 0.80–1.10)

## 2019-03-11 LAB — VENOUS BLOOD GAS
%FIO2 (VENOUS): 21 %
BASE EXCESS: 0.6 mmol/L (ref ?–2.0)
BICARBONATE (VENOUS): 24.5 mmol/L (ref 23.0–33.0)
O2 SATURATION (VENOUS): 57.8 % — ABNORMAL LOW (ref 95.0–98.0)
PCO2 (VENOUS): 47 mmHg (ref 38.00–50.00)
PH (VENOUS): 7.36 (ref 7.32–7.45)
PO2 (VENOUS): 35 mmHg (ref 30.0–50.0)

## 2019-03-11 LAB — B-TYPE NATRIURETIC PEPTIDE (BNP),PLASMA: BNP: 226 pg/mL — ABNORMAL HIGH (ref 0–100)

## 2019-03-11 LAB — TROPONIN-I: TROPONIN I: 0.01 ng/mL (ref <=0.04)

## 2019-03-11 LAB — THYROID STIMULATING HORMONE (SENSITIVE TSH): TSH: 15 u[IU]/mL — ABNORMAL HIGH (ref 0.450–5.330)

## 2019-03-11 MED ORDER — LORATADINE 10 MG TABLET
10.0000 mg | ORAL_TABLET | Freq: Every day | ORAL | Status: DC
Start: 2019-03-12 — End: 2019-03-12
  Administered 2019-03-12: 10 mg via ORAL
  Filled 2019-03-11 (×2): qty 1

## 2019-03-11 MED ORDER — PANTOPRAZOLE 40 MG TABLET,DELAYED RELEASE
40.00 mg | DELAYED_RELEASE_TABLET | Freq: Every morning | ORAL | Status: DC
Start: 2019-03-12 — End: 2019-03-12
  Administered 2019-03-12: 05:00:00 40 mg via ORAL
  Filled 2019-03-11 (×3): qty 1

## 2019-03-11 MED ORDER — SODIUM CHLORIDE 0.9 % (FLUSH) INJECTION SYRINGE
3.0000 mL | INJECTION | Freq: Three times a day (TID) | INTRAMUSCULAR | Status: DC
Start: 2019-03-11 — End: 2019-03-12
  Administered 2019-03-11: 0 mL
  Administered 2019-03-12: 14:00:00
  Administered 2019-03-12: 0 mL

## 2019-03-11 MED ORDER — ALBUTEROL SULFATE 2.5 MG/3 ML (0.083 %) SOLUTION FOR NEBULIZATION
2.50 mg | INHALATION_SOLUTION | RESPIRATORY_TRACT | Status: DC | PRN
Start: 2019-03-11 — End: 2019-03-12

## 2019-03-11 MED ORDER — APIXABAN 5 MG TABLET
5.00 mg | ORAL_TABLET | Freq: Two times a day (BID) | ORAL | Status: DC
Start: 2019-03-11 — End: 2019-03-12
  Administered 2019-03-11 – 2019-03-12 (×2): 5 mg via ORAL
  Filled 2019-03-11 (×5): qty 1

## 2019-03-11 MED ORDER — MAGNESIUM OXIDE 400 MG (241.3 MG MAGNESIUM) TABLET
400.0000 mg | ORAL_TABLET | Freq: Every day | ORAL | Status: DC
Start: 2019-03-12 — End: 2019-03-12
  Administered 2019-03-12: 09:00:00 400 mg via ORAL
  Filled 2019-03-11 (×3): qty 1

## 2019-03-11 MED ORDER — FLUTICASONE PROPIONATE 50 MCG/ACTUATION NASAL SPRAY,SUSPENSION
1.0000 | Freq: Every day | NASAL | Status: DC
Start: 2019-03-12 — End: 2019-03-12
  Administered 2019-03-12: 1 via NASAL
  Filled 2019-03-11: qty 16

## 2019-03-11 MED ORDER — SODIUM CHLORIDE 0.9 % (FLUSH) INJECTION SYRINGE
3.0000 mL | INJECTION | INTRAMUSCULAR | Status: DC | PRN
Start: 2019-03-11 — End: 2019-03-12

## 2019-03-11 MED ORDER — ATORVASTATIN 10 MG TABLET
10.0000 mg | ORAL_TABLET | Freq: Every day | ORAL | Status: DC
Start: 2019-03-12 — End: 2019-03-12
  Administered 2019-03-12: 09:00:00 10 mg via ORAL
  Filled 2019-03-11 (×3): qty 1

## 2019-03-11 MED ORDER — BUDESONIDE-FORMOTEROL HFA 160 MCG-4.5 MCG/ACTUATION - EAST
2.00 | INHALATION_SPRAY | Freq: Two times a day (BID) | RESPIRATORY_TRACT | Status: DC
Start: 2019-03-11 — End: 2019-03-12
  Administered 2019-03-11 – 2019-03-12 (×2): 2 via RESPIRATORY_TRACT
  Filled 2019-03-11 (×2): qty 6

## 2019-03-11 MED ORDER — DIPHENHYDRAMINE 25 MG CAPSULE
50.0000 mg | ORAL_CAPSULE | Freq: Once | ORAL | Status: AC
Start: 2019-03-12 — End: 2019-03-11
  Administered 2019-03-11: 50 mg via ORAL
  Administered 2019-03-12: 0 mg via ORAL
  Filled 2019-03-11: qty 2

## 2019-03-11 MED ORDER — FLECAINIDE 100 MG TABLET
100.00 mg | ORAL_TABLET | Freq: Two times a day (BID) | ORAL | Status: DC
Start: 2019-03-11 — End: 2019-03-12
  Administered 2019-03-11 – 2019-03-12 (×2): 100 mg via ORAL
  Filled 2019-03-11 (×4): qty 1
  Filled 2019-03-11: qty 2
  Filled 2019-03-11: qty 1

## 2019-03-11 MED ORDER — LOPERAMIDE 2 MG CAPSULE
2.00 mg | ORAL_CAPSULE | ORAL | Status: DC | PRN
Start: 2019-03-11 — End: 2019-03-12
  Filled 2019-03-11: qty 1

## 2019-03-11 MED ORDER — IPRATROPIUM 0.5 MG-ALBUTEROL 3 MG (2.5 MG BASE)/3 ML NEBULIZATION SOLN
3.00 mL | INHALATION_SOLUTION | RESPIRATORY_TRACT | Status: AC
Start: 2019-03-11 — End: 2019-03-11
  Administered 2019-03-11: 11:00:00 3 mL via RESPIRATORY_TRACT

## 2019-03-11 MED ORDER — PREDNISONE 10 MG TABLET
50.00 mg | ORAL_TABLET | Freq: Four times a day (QID) | ORAL | Status: AC
Start: 2019-03-11 — End: 2019-03-12
  Administered 2019-03-11 – 2019-03-12 (×3): 50 mg via ORAL
  Filled 2019-03-11 (×5): qty 1

## 2019-03-11 MED ORDER — OXYCODONE 5 MG TABLET
5.00 mg | ORAL_TABLET | Freq: Four times a day (QID) | ORAL | 0 refills | Status: DC | PRN
Start: 2019-03-11 — End: 2019-04-29

## 2019-03-11 MED ORDER — LINACLOTIDE 72 MCG CAPSULE
72.00 ug | ORAL_CAPSULE | Freq: Every morning | ORAL | Status: DC
Start: 2019-03-12 — End: 2019-03-12
  Administered 2019-03-12: 72 ug via ORAL
  Filled 2019-03-11 (×3): qty 1

## 2019-03-11 MED ORDER — DIPHENHYDRAMINE 50 MG CAPSULE
50.0000 mg | ORAL_CAPSULE | Freq: Once | ORAL | Status: AC
Start: 2019-03-12 — End: 2019-03-12
  Administered 2019-03-12: 50 mg via ORAL
  Filled 2019-03-11: qty 1

## 2019-03-11 MED ORDER — OXYCODONE 5 MG TABLET
5.00 mg | ORAL_TABLET | Freq: Four times a day (QID) | ORAL | Status: DC | PRN
Start: 2019-03-11 — End: 2019-03-12

## 2019-03-11 MED ORDER — POTASSIUM CHLORIDE ER 20 MEQ TABLET,EXTENDED RELEASE(PART/CRYST)
20.0000 meq | ORAL_TABLET | Freq: Every day | ORAL | Status: DC
Start: 2019-03-12 — End: 2019-03-12
  Administered 2019-03-12: 20 meq via ORAL
  Filled 2019-03-11 (×2): qty 1

## 2019-03-11 MED ORDER — DOCUSATE SODIUM 100 MG CAPSULE
100.00 mg | ORAL_CAPSULE | Freq: Three times a day (TID) | ORAL | Status: DC | PRN
Start: 2019-03-11 — End: 2019-03-12

## 2019-03-11 MED ORDER — ROPINIROLE 0.5 MG TABLET
0.50 mg | ORAL_TABLET | Freq: Every evening | ORAL | Status: DC
Start: 2019-03-11 — End: 2019-03-12
  Administered 2019-03-11: 0.5 mg via ORAL
  Filled 2019-03-11 (×4): qty 1

## 2019-03-11 MED ORDER — LISINOPRIL 20 MG TABLET
20.0000 mg | ORAL_TABLET | Freq: Every day | ORAL | Status: DC
Start: 2019-03-12 — End: 2019-03-12
  Administered 2019-03-12: 09:00:00 20 mg via ORAL
  Filled 2019-03-11 (×3): qty 1

## 2019-03-11 NOTE — ED Nurses Note (Signed)
Patient ambulates to restroom independently and then back to room . Void reported [employer], RN  03/11/2019, 20:48

## 2019-03-11 NOTE — Progress Notes (Signed)
[place]  [place] [address]        Encounter Date: 03/11/2019   9:30 AM EDT    Name:  Kelsey [name]  Age: 68 y.o.  DOB: [birth year]  Sex: female  PCP: Remer    Chief Complaint:    Chief Complaint   Patient presents with   . Treatment     HISTORY OF PRESENT ILLNESS:   68 y.o. female with history of stage III colon cancer of present evaluation and management of lung cancer.    1. September 2015. Patient underwent sigmoidoscopy and was found to have polyp which was adenocarcinoma.    2. June 10, 2014. She was admitted for segmental resection of sigmoid colon. This revealed pT3, N1a, MX, low-grade  adenocarcinoma of the colon. The mass measuring 4 x 3 x 0.5 centimeters. It  was low-grade and there was no evidence of microsatellite instability by  histology. Margins were uninvolved and 4 lymph nodes only were removed which  only 1 was involved. This corresponding stage IIIB, T3, N1a, M0, low-grade  sigmoid cancer.     3. July 07, 2014. Recommended adjuvant chemotherapy with FOLFOX.    4. July 27, 2014. Patient was started on adjuvant chemotherapy with FOLFOX.  Patient received only 5 treatments and chemotherapy was stopped due to poor tolerance.  After that patient lost follow-up.      5. October 2019. Patient was admitted to hospital with concern for  bowel obstruction.  CT chest showed lung nodules.  Patient was managed conservatively and discharged home.    6. May 06, 2018.  Patient underwent CT-guided biopsy of lung lesion.  Pathology showed well-differentiated adenocarcinoma.  Section shows well-differentiated adenocarcinoma which positive for CK 7 and TTF 1, negative for P 63 and CK 20.     7. June 23, 2018. PET scan showed Hypermetabolic right lower lobe pulmonary mass compatible with malignancy.  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 Hepatomegaly and  steatosis.     8. July 23, 2018. Patient was started on chemoradiation.    9. October 30, 2018. PET scan showed Hypermetabolic right lower lobe mass with abnormal mediastinal and hilar nodes. There has been only mild improvement since earlier exam.    10. October 31, 2018. Patient was started on maintenance immunotherapy with durvalumab.    11. February 17, 2019. CT chest revealed Moderate right effusion new from 12/19/2018. No change in the right lower lung mass or a presumed postradiation changes in the right upper lung when compared to 12/19/2018. Mass in the right cardiophrenic angle increased from 10/30/2019 6). Consider an enlarged lymph node here. This is unchanged from 12/19/2018 but increased since size from 10/30/2018. Another possibility is an enlarging pericardial cyst (this region was not hypermetabolic 11/26/8117). Mild enlargement of the left adrenal gland stable from 10/30/2018. Approximate 9 cm upper abdominal ventral hernia containing nonobstructed bowel loops unchanged    12. March 06, 2019. PET CT revealed 1. The RIGHT lower lobe mass has decreased in size and uptake, which is consistent with response to therapy. However, abnormal uptake remains. This is consistent with residual tumor. Suspect post radiation change in the RIGHT pneumothorax. Consolidation in the medial RIGHT  lung has tumor level uptake. Differential: Post radiation change, infection, and tumor infiltration. Additional findings are concerning for tumor infiltration. Follow-up recommended. Increased RIGHT hilar density and uptake. Concerning for tumor spread. Post radiation change less likely. 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 A low-attenuation mass in the RIGHT cardiophrenic angle may be related. Lymph node metastasis less likely.      SUBJECTIVE:  Patient presents for follow-up and treatment.  Patient reports she has just been feeling extremely fatigued and has felt like an elephant is sitting on her chest.  Patient denies any radiating  pain down either arm or jaw numbness.  Patient denies any headache, blurred vision, dizziness.  Patient does report increase in shortness of breath and difficulty to freeze.  Patient was scheduled for ultrasound thoracentesis but was not enough fluid for removal but recent PET she does moderate effusion.  Patient denies any new abdominal pain and reports baseline diarrhea.  Patient reports right lung pain that is stable in nature but she still takes her pain pills as needed.      REVIEW OF SYSTEMS:  GENERAL:   Denies fever or chills, reports extreme fatigue   HEENT: no sore throat, congestion, blurry vision  Lungs:  Reports increased shortness of breath and dry cough  GI: no nausea, vomiting, constipation, no diarrhea  GU: No dysuria, urgency, increased frequency   Cardiac: reports chest pains/heaviness, palpitations, syncope,   Neuro: no weakness, loss of function, numbness, tingling  Skin: no rash  Musculoskeletal: No myalgias  Psychosocial: No depression, anxiety  Hematologic: No easy bruising, abnormal bleeding  All other review ROS negative except those mentioned above.     PATIENT HISTORY:  Past Medical History:   Diagnosis Date   . A-fib (CMS HCC)    . Arthropathy, unspecified, site unspecified    . Asthma    . Cancer (CMS HCC)     cervical   . Cataract    . Colon cancer (CMS [place]) 07/06/2014   . COPD (chronic obstructive pulmonary disease) (CMS HCC)    . Fistula    . HTN (hypertension)    . Hyperlipidemia    . Lung mass     with pleural effusion   . Obesity    . Sleep apnea    . Ventral hernia        Past Surgical History:   Procedure Laterality Date   . ABSCESS DRAINAGE     . BOWEL RESECTION     . CATARACT EXTRACTION     . HX CERVICAL CONE BIOPSY      . HX CESAREAN SECTION     . HX GASTRIC BYPASS      25 years ago   . [place] (x2)   . HX HYSTERECTOMY      late 1990s   . HX LAP BANDING      2013   . HX LAPAROTOMY      2013 to remove lap band       Family Medical History:     Problem  Relation (Age of Onset)    Diabetes Mother, Maternal Grandmother, Maternal Grandfather, Father    Heart Attack Mother    Stroke Father          Current Outpatient Medications   Medication Sig   . albuterol sulfate (PROAIR HFA) 90 mcg/actuation Inhalation HFA Aerosol Inhaler Take 1-2 Puffs by inhalation Every 6 hours as needed   . albuterol sulfate (PROVENTIL) 2.5  mg /3 mL (0.083 %) Inhalation Solution for Nebulization 3 mL (2.5 mg total) by Nebulization route Every 6 hours   . apixaban (ELIQUIS) 5 mg Oral Tablet Take 1 Tab (5 mg total) by mouth Twice daily   . atorvastatin (LIPITOR) 10 mg Oral Tablet Take 10 mg by mouth Once a day   . budesonide-formoteroL (SYMBICORT) 160-4.5 mcg/actuation Inhalation HFA Aerosol Inhaler Take 2 Puffs by inhalation Twice daily   . docusate sodium (COLACE) 100 mg Oral Capsule Take 100 mg by mouth Three times a day as needed    . flecainide (TAMBOCOR) 100 mg Oral Tablet Take 100 mg by mouth Twice daily   . fluticasone propionate (FLONASE) 50 mcg/actuation Nasal Spray, Suspension 1 Spray by Each Nostril route Once a day   . linaCLOtide (LINZESS) 72 mcg Oral Capsule Take 72 mcg by mouth Every morning   . lisinopriL (PRINIVIL) 20 mg Oral Tablet Take 20 mg by mouth Once a day   . loperamide (IMODIUM) 2 mg Oral Capsule Take 2 mg by mouth Every 4 hours as needed   . loratadine (CLARITIN) 10 mg Oral Tablet Take 1 Tab (10 mg total) by mouth Once a day   . magnesium oxide (MAG-OX) 400 mg Oral Tablet Take 400 mg by mouth Once a day   . omeprazole (PRILOSEC) 20 mg Oral Capsule, Delayed Release(E.C.) Take 40 mg by mouth Once a day    . oxyCODONE (ROXICODONE) 5 mg Oral Tablet Take 1 Tab (5 mg total) by mouth Every 6 hours as needed for Pain   . potassium chloride (K-DUR) 20 mEq Oral Tab Sust.Rel. Particle/Crystal Take 20 mEq by mouth Once a day Takes 3 days a week   . prochlorperazine (COMPAZINE) 10 mg Oral Tablet Take 1 Tab (10 mg total) by mouth Four times a day as needed for Nausea/Vomiting   .  rOPINIRole (REQUIP) 0.5 mg Oral Tablet Take 0.5 mg by mouth Every night     Social History     Socioeconomic History   . Marital status: Married     Spouse name: Not on file   . Number of children: Not on file   . Years of education: Not on file   . Highest education level: Not on file   Tobacco Use   . Smoking status: Former Research scientist (life sciences)   . Smokeless tobacco: Never Used   . Tobacco comment: quit 10 yrs ago   Substance and Sexual Activity   . Alcohol use: Not Currently     Comment: rarely   . Drug use: No   Other Topics Concern   . Ability to Walk 1 Flight of Steps without SOB/CP No   . Ability To Do Own ADL's Yes       PHYSICAL EXAMINATION:  VITALS:   Vitals:    12/16/18 1027   BP: (!)136/82   Pulse: 74   Resp: 16   Temp: 36.6 C (97.9 F)   TempSrc: Tympanic   SpO2: 92%   Height: 1.575 m (5' 2" )       PHYSICAL EXAM:   Consitutional: Appears fatigue  Eyes: EOMI. No discharge. No Jaundice.   ENT: mucous membranes dry. No posterior pharynx lesions.. Neck supple, No palpable masses   Heme/ Lymph: No Cervical, Inguinal, axillary lymh nodes. No Bruising.   Cardiovascular: S1, S2, No murmurs, rubs, or gallops. Irregular rhythm   Respiratory: Wheezes in bilateral lower lung fields. Right lower lung with auscultatory crackles  Abdomen: Normal Bowel Sounds, nontender nondistended. No  hepatosplenomegaly.   Musculoskeletal: No Edema to the extremities.   Skin: Normal turgor. No Rashes,skin lesions   Psychiatry: Normal Affect   Neuro: No focal deficits. Alert and Oriented x 3      ENCOUNTER ORDERS:  Orders Placed This Encounter   . US THORACENTESIS   . ADDITIONAL TESTING  REQUEST   . THYROID STIMULATING HORMONE (SENSITIVE TSH)   . THYROID STIMULATING HORMONE (SENSITIVE TSH)       LABORATORY/RADIOLOGICAL DATA: All pertinent labs/radiology were reviewed.   PET CT 03/06/19      IMPRESSION:  1. The RIGHT lower lobe mass has decreased in size and uptake, which is  consistent with response to therapy. However, abnormal uptake remains.  This  is consistent with residual tumor.  2. Suspect post radiation change in the RIGHT pneumothorax. Consolidation  in the medial RIGHT lung has tumor level uptake. Differential: Post  radiation change, infection, and tumor infiltration. Additional findings  are concerning for tumor infiltration. Follow-up recommended.  3. Increased RIGHT hilar density and uptake. Concerning for tumor spread.  Post radiation change less likely.  4. 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 A low-attenuation mass in the  RIGHT cardiophrenic angle may be related. Lymph node metastasis less  Likely.      ASSESSMENT AND PLAN:  68 y.o. female with history of stage III colon cancer status post 5 cycle of FOLFOX now presenting with stage III lung cancer.  I explained the reason for referred to Hematology/Oncology Clinic.    ICD-10-CM    1. Pleural effusion J90 US THORACENTESIS     ADDITIONAL TESTING  REQUEST     THYROID STIMULATING HORMONE (SENSITIVE TSH)   2. Elevated TSH R79.89 ADDITIONAL TESTING  REQUEST     THYROID STIMULATING HORMONE (SENSITIVE TSH)     THYROID STIMULATING HORMONE (SENSITIVE TSH)   3. Abnormal results of thyroid function studies  R94.6 THYROID STIMULATING HORMONE (SENSITIVE TSH)     THYROID STIMULATING HORMONE (SENSITIVE TSH)   4. Anemia, unspecified type D64.9    5. Malignant neoplasm of lung, unspecified laterality, unspecified part of lung (CMS HCC) C34.90    6. Encounter for antineoplastic chemotherapy Z51.11    7. CINV (chemotherapy-induced nausea and vomiting) R11.2     T45.1X5A    8. Transaminitis R74.0    9. Diarrhea, unspecified type R19.7    10. Dyspnea on exertion R06.09    11. Pain R52    12. Dyspnea, unspecified type R06.00    13. Chemotherapy induced nausea and vomiting R11.2     T45.1X5A    14. Back pain, unspecified back location, unspecified back pain laterality, unspecified chronicity M54.9    15. Cough R05    16. Hypothyroidism, unspecified type E03.9    17. Chest pain, unspecified type R07.9               1. Adenocarcinoma lung:  Stage III.    CT brain to rule out metastatic disease.  NEGATIVE.   Started on chemoradiation carboplatin/Taxol along with radiation- July 23, 2018.  After completion of chemo radiation patient will get PET scan.  If continued to have good response will start maintenance immunotherapy for 1 year.   Completed 6/6 cycles of Taxol/Carboplatin and will start maintenance immunotherapy therapy following radiation therapy and PET scan    CHEMOTHERAPY:/RADIATION  CYCLE 8/26 DAY   DRUG DOSE TOTAL DOSE % ROUTE SCHEDULE   Carboplatin AUC- 2 205 mg    COMPLETED   Paclitaxel 45 mg/m2 108 mg  COMPLETED   Durvalumab                  Shared decision.  I discussed diagnosis, prognosis and treatment option with patient and family.  I had detailed discussion with patient regarding chemotherapy and radiation.  I also discussed brain scan to rule out metastatic disease before starting treatment.  I counseled patient regarding chemotherapy side effects and their management.  Patient understand and want to proceed with treatment.    - Ordered PET/CT to rule out progression of malignancy    2. Neutropenia:  Due to chemotherapy.   Resolved.    3. CINV    Zofran PRN    4. Transaminitis: RESOLVED   Instructed to avoid tylenol/alcoholic beverages    5. Thrombocytopenia:  Resolved    6. Dyspnea/fevers: RESOLVED   COVID negative    PET-CT increase in pleural effusion. Will proceed with US thoracentesis and review histology of fluid to rule out disease. If malignant cells found on thoracentesis fluid, Dr. Deatra Canter will recommend changing treatment possibly.     7. Hypokalemia 3.3   Patient on K+ at home    8. Pain   Patient reports chronic in nature.   Oxycodone every 6 hours     9. Hypothyroidism   Elevated TSH will discuss synthroid @ next appointment   Sent TSH today    10. Angina   Sent to ER due to hx of A.Fib/Angina in the past    Plan:  Instructed patient to go to ER for cardiac workup and  return in 1 week.  Also, ultrasound thoracentesis was ordered.     Return in about 1 week (around 03/18/2019) for cbc/diff, CMP, EXAM, Treatment.      Omer Jack, APRN, FNP-C      The patient was seen independently by the Nurse Practitioner. I reviewed the patient's chart and agree with the NP's plan.     Vanetta Mulders, MD  Hematology/Oncology

## 2019-03-11 NOTE — ED Nurses Note (Signed)
Introduced self to patient. No s/sof distress. Denies needs or complaints at this time Hoover Brunette, RN  03/11/2019, 18:37

## 2019-03-11 NOTE — H&P (Signed)
Kelsey [name]  Admission H&P    Date of Service:  03/11/2019  Kelsey [name], Kelsey [name], 68 y.o. female  Encounter Start Date:  03/11/2019  Inpatient Admission Date:    Date of Birth:  08/18/1951  PCP: [facility]          Information Obtained from: patient  Chief Complaint:  Shortness of breath    HPI: Kelsey [name] is a 68 y.o., White female who presents with who has a history of lung cancer currently receiving chemotherapy who presented to [facility] Emergency room earlier today with a chief complaint of shortness of breath.  She states that her shortness of breath has been going on for several weeks but has worsened recently.  She does have an underlying history of COPD.  Her oncologist had concern for pulmonary embolism.  Patient does have risk factors for blood clots including her active cancer.  Of note she already was on Eliquis for her history of atrial fibrillation as well as previous history of DVTs.  Patient is being admitted to the hospitalist service under observation status for pre treatment prior to undergoing a CT scan.    Patient currently states that she feels stable at this point.  She is short of breath but not necessarily getting worse.  No fever chills nausea vomiting.    PAST MEDICAL:    Past Medical History:   Diagnosis Date   . A-fib (CMS HCC)    . Arthropathy, unspecified, site unspecified    . Asthma    . Cancer (CMS HCC)     cervical   . Cataract    . Colon cancer (CMS [place]) 07/06/2014   . COPD (chronic obstructive pulmonary disease) (CMS HCC)    . Fistula    . HTN (hypertension)    . Hyperlipidemia    . Lung mass     with pleural effusion   . Obesity    . Sleep apnea    . Ventral hernia         Past Surgical History:   Procedure Laterality Date   . ABSCESS DRAINAGE     . BOWEL RESECTION     . CATARACT EXTRACTION     . HX CERVICAL CONE BIOPSY      . HX CESAREAN SECTION     . HX GASTRIC BYPASS      25 years ago   . [place] (x2)   . HX HYSTERECTOMY      late 1990s   . HX LAP BANDING      2013   . HX LAPAROTOMY      2013 to remove lap band            Medications Prior to Admission     Prescriptions    albuterol sulfate (PROAIR HFA) 90 mcg/actuation Inhalation HFA Aerosol Inhaler    Take 1-2 Puffs by inhalation Every 6 hours as needed    albuterol sulfate (PROVENTIL) 2.5 mg /3 mL (0.083 %) Inhalation Solution for Nebulization    3 mL (2.5 mg total) by Nebulization route Every 6 hours    apixaban (ELIQUIS) 5 mg Oral Tablet    Take 1 Tab (5 mg total) by mouth Twice daily    atorvastatin (LIPITOR) 10 mg Oral Tablet    Take 10 mg by mouth Once a day    budesonide-formoteroL (SYMBICORT) 160-4.5 mcg/actuation Inhalation [employer] Aerosol Inhaler  Take 2 Puffs by inhalation Twice daily    docusate sodium (COLACE) 100 mg Oral Capsule    Take 100 mg by mouth Three times a day as needed     flecainide (TAMBOCOR) 100 mg Oral Tablet    Take 100 mg by mouth Twice daily    fluticasone propionate (FLONASE) 50 mcg/actuation Nasal Spray, Suspension    1 Spray by Each Nostril route Once a day    linaCLOtide (LINZESS) 72 mcg Oral Capsule    Take 72 mcg by mouth Every morning    lisinopriL (PRINIVIL) 20 mg Oral Tablet    Take 20 mg by mouth Once a day    loperamide (IMODIUM) 2 mg Oral Capsule    Take 2 mg by mouth Every 4 hours as needed    loratadine (CLARITIN) 10 mg Oral Tablet    Take 1 Tab (10 mg total) by mouth Once a day    magnesium oxide (MAG-OX) 400 mg Oral Tablet    Take 400 mg by mouth Once a day    omeprazole (PRILOSEC) 20 mg Oral Capsule, Delayed Release(E.C.)    Take 40 mg by mouth Once a day     oxyCODONE (ROXICODONE) 5 mg Oral Tablet    Take 1 Tab (5 mg total) by mouth Every 6 hours as needed for Pain    potassium chloride (K-DUR) 20 mEq Oral Tab Sust.Rel. Particle/Crystal    Take 20 mEq by mouth Once a day Takes 3 days a week    prochlorperazine (COMPAZINE) 10 mg Oral Tablet    Take 1 Tab (10 mg total) by mouth Four times a day as needed  for Nausea/Vomiting    rOPINIRole (REQUIP) 0.5 mg Oral Tablet    Take 0.5 mg by mouth Every night        Allergies   Allergen Reactions   . Shellfish Containing Products Shortness of Breath, Rash and Itching     scallops   . Vancomycin Shortness of Breath   . Barium Iodide    . Iodine And Iodide Containing Products      Itching, shortness of breath          Family History:  Family Medical History:     Problem Relation (Age of Onset)    Diabetes Mother, Maternal Grandmother, Maternal Grandfather, Father    Heart Attack Mother    Stroke Father        Social History:  Social History     Socioeconomic History   . Marital status: Married     Spouse name: Not on file   . Number of children: Not on file   . Years of education: Not on file   . Highest education level: Not on file   Occupational History   . Not on file   Social Needs   . Financial resource strain: Not on file   . Food insecurity     Worry: Not on file     Inability: Not on file   . Transportation needs     Medical: Not on file     Non-medical: Not on file   Tobacco Use   . Smoking status: Former Research scientist (life sciences)   . Smokeless tobacco: Never Used   . Tobacco comment: quit 10 yrs ago   Substance and Sexual Activity   . Alcohol use: Not Currently     Comment: rarely   . Drug use: No   . Sexual activity: Not on file   Lifestyle   .  Physical activity     Days per week: Not on file     Minutes per session: Not on file   . Stress: Not on file   Relationships   . Social Product manager on phone: Not on file     Gets together: Not on file     Attends religious service: Not on file     Active member of club or organization: Not on file     Attends meetings of clubs or organizations: Not on file     Relationship status: Not on file   . Intimate partner violence     Fear of current or ex partner: Not on file     Emotionally abused: Not on file     Physically abused: Not on file     Forced sexual activity: Not on file   Other Topics Concern   . Ability to Walk 1 Flight of  Steps without SOB/CP No   . Routine Exercise Not Asked   . Ability to Walk 2 Flight of Steps without SOB/CP Not Asked   . Unable to Ambulate Not Asked   . Total Care Not Asked   . Ability To Do Own ADL's Yes   . Uses Walker Not Asked   . Other Activity Level Not Asked   . Uses Cane Not Asked   Social History Narrative   . Not on file         ROS: Other than ROS in the HPI, all other systems were negative.    Examination:  Temperature: 36 C (96.8 F) Heart Rate: 51 BP (Non-Invasive): 128/84   Respiratory Rate: 16 SpO2: 97 %       Exam:    Alert and oriented x3.  No acute distress.  Pleasant.  Mood affect and speech were all normal.  Pupils are equal reactive to light.  Extraocular movements intact.  Regular rate, no murmurs.  Breath sounds are reduced with diffuse wheezing throughout.  Abdomen is soft, nontender, nondistended.  Extremities are warm.  Good pulses.  No edema.  Skin has no rashes.  No jaundice.  Cranial nerves 2-12 grossly intact.     Labs:    Lab Results Today:    Results for orders placed or performed during the hospital encounter of 03/11/19 (from the past 24 hour(s))   B-TYPE NATRIURETIC PEPTIDE   Result Value Ref Range    BNP 226 (H) 0 - 100 pg/mL   ECG 12 LEAD - ED USE   Result Value Ref Range    Heart Rate 68 BPM    PR Interval 187 ms    QRS Duration 111 ms    QT Interval 454 ms    QTC Calculation 483 ms    Calculated P Axis 70 deg    QRS Axis 58 deg    Calculated T Axis 132 deg    I 40 Axis 44 deg    T 40 Axis 156 deg    ST Axis 110 deg    EKG Severity - ABNORMAL ECG -    BASIC METABOLIC PANEL   Result Value Ref Range    SODIUM 137 135 - 145 mmol/L    POTASSIUM 3.4 (L) 3.5 - 5.0 mmol/L    CHLORIDE 103 98 - 111 mmol/L    CO2 TOTAL 27 21 - 35 mmol/L    ANION GAP 7 mmol/L    CALCIUM 8.6 (L) 8.8 - 10.3 mg/dL    GLUCOSE 108  70 - 110 mg/dL    BUN 13 10 - 25 mg/dL    CREATININE 0.88 <=1.30 mg/dL    BUN/CREA RATIO 15     ESTIMATED GFR >60 Avg: 85 mL/min/1.49m^2   PT/INR   Result Value Ref Range    INR  1.40 (H) 0.80 - 1.10   TROPONIN-I   Result Value Ref Range    TROPONIN I 0.01 <=0.04 ng/mL   CBC WITH DIFF   Result Value Ref Range    WBC 6.5 3.5 - 10.3 x10^3/uL    RBC 4.99 3.80 - 5.24 x10^6/uL    HGB 12.0 11.8 - 15.8 g/dL    HCT 37.1 34.6 - 46.2 %    MCV 74.5 (L) 82.3 - 96.7 fL    MCH 24.2 (L) 27.6 - 33.2 pg    MCHC 32.4 (L) 32.6 - 35.4 g/dL    RDW 18.8 (H) 12.4 - 15.2 %    PLATELETS 197 140 - 440 x10^3/uL    MPV 8.8 6.6 - 10.2 fL    NEUTROPHIL % 75 %    LYMPHOCYTE % 12 %    MONOCYTE % 11 %    EOSINOPHIL % 1 %    BASOPHIL % 1 %    NEUTROPHIL # 4.80 1.50 - 6.40 x10^3/uL    LYMPHOCYTE # 0.80 (L) 0.90 - 3.40 x10^3/uL    MONOCYTE # 0.70 0.20 - 0.90 x10^3/uL    EOSINOPHIL # 0.10 0.00 - 0.50 x10^3/uL    BASOPHIL # 0.00 0.00 - 0.20 x10^3/uL   VENOUS BLOOD GAS   Result Value Ref Range    %FIO2 (VENOUS) 21.0 %    BICARBONATE (VENOUS) 24.5 23.0 - 33.0 mmol/L    PCO2 (VENOUS) 47.00 38.00 - 50.00 mm/Hg    PH (VENOUS) 7.36 7.32 - 7.45    PO2 (VENOUS) 35.0 30.0 - 50.0 mm/Hg    BASE EXCESS 0.6 -2.0 - 3.0 mmol/L    O2 SATURATION (VENOUS) 57.8 (L) 95.0 - 98.0 %   Results for orders placed or performed during the hospital encounter of 03/11/19 (from the past 24 hour(s))   COMPREHENSIVE METABOLIC PANEL, NON-FASTING   Result Value Ref Range    SODIUM 137 135 - 145 mmol/L    POTASSIUM 3.3 (L) 3.5 - 5.0 mmol/L    CHLORIDE 103 98 - 111 mmol/L    CO2 TOTAL 26 21 - 35 mmol/L    ANION GAP 8 mmol/L    BUN 12 10 - 25 mg/dL    CREATININE 0.90 <=1.30 mg/dL    BUN/CREA RATIO 13     ESTIMATED GFR >60 Avg: 85 mL/min/1.72m^2    ALBUMIN 3.4 3.2 - 4.6 g/dL    CALCIUM 8.6 (L) 8.8 - 10.3 mg/dL    GLUCOSE 129 (H) 70 - 110 mg/dL    ALKALINE PHOSPHATASE 91 20 - 130 U/L    ALT (SGPT) 15 <=52 U/L    AST (SGOT) 21 <=35 U/L    BILIRUBIN TOTAL 0.6 0.3 - 1.2 mg/dL    PROTEIN TOTAL 5.8 (L) 6.0 - 8.3 g/dL   CBC WITH DIFF   Result Value Ref Range    WBC 5.2 3.5 - 10.3 x10^3/uL    RBC 4.84 3.80 - 5.24 x10^6/uL    HGB 11.8 11.8 - 15.8 g/dL    HCT 36.4 34.6 - 46.2  %    MCV 75.2 (L) 82.3 - 96.7 fL    MCH 24.5 (L) 27.6 - 33.2 pg    MCHC 32.5 (L)  32.6 - 35.4 g/dL    RDW 18.8 (H) 12.4 - 15.2 %    PLATELETS 207 140 - 440 x10^3/uL    MPV 7.9 6.6 - 10.2 fL    NEUTROPHIL % 75 %    LYMPHOCYTE % 11 %    MONOCYTE % 12 %    EOSINOPHIL % 2 %    BASOPHIL % 1 %    NEUTROPHIL # 3.90 1.50 - 6.40 x10^3/uL    LYMPHOCYTE # 0.60 (L) 0.90 - 3.40 x10^3/uL    MONOCYTE # 0.60 0.20 - 0.90 x10^3/uL    EOSINOPHIL # 0.10 0.00 - 0.50 x10^3/uL    BASOPHIL # 0.00 0.00 - 0.20 x10^3/uL   THYROID STIMULATING HORMONE (SENSITIVE TSH)   Result Value Ref Range    TSH 15.000 (H) 0.450 - 5.330 uIU/mL       Imaging Studies:      Chest x-ray shows abnormal right perihilar opacity likely corresponding to a consolidation demonstrated on CT scan from February 17, 2019. Is a small right pleural effusion.        DNR Status:  Prior    Assessment/Plan:     Dyspnea  COPD  -bronchodilators.  Given her risk factors for thromboembolism will pre treat the patient for a CT scan of the chest with IV contrast to evaluate for pulmonary embolism.  Monitor the patient on telemetry.  Continue her home Eliquis.    Adenocarcinoma of lung stage 3  -continue management as outpatient by Oncology.  Most recent PET scan on March 06, 2019 was concerning for tumor advancement despite treatment.    Paroxysmal atrial fibrillation  -continue rate-controlling medications.  Monitor on telemetry.    DVT/PE Prophylaxis: Apixaban  Disposition Planning: Home discharge     Felipa Evener, MD

## 2019-03-11 NOTE — ED Nurses Note (Signed)
Patient sitting up in recliner watching TV. Respirations even and unlabored. Denies needs at this time Hoover Brunette, RN  03/11/2019, 20:42

## 2019-03-11 NOTE — ED Nurses Note (Signed)
Pharmacy called for med Hoover Brunette, RN  03/11/2019, 19:01

## 2019-03-11 NOTE — ED Nurses Note (Signed)
Kelsey [name] tech in patients room

## 2019-03-11 NOTE — ED Attending Note (Signed)
ADDENDUM:    I performed a history and physical examination of the patient and discussed his/her management with the resident.  I reviewed the resident's note and agree with the documented findings and plan of care with the following additions / exceptions    Charolotte Eke, MD 03/11/2019, 11:19    HPI:    68 y.o. female sent from her oncologist's office to rule out a pulmonary embolism.  Patient presented to the office today for an  effusion.  Upon arrival she did complain of chest pain and shortness of breath.  At that point she was instructed to come to the ED to rule out a PE.  The patient does report a remote history of allergies to contrast.    PE:  BP (!) 148/78   Pulse 57   Temp 36 C (96.8 F)   Resp 18   Ht 1.575 m (5\' 2" )   Wt 121 kg (267 lb)   SpO2 97%   BMI 48.83 kg/m       Patient afebrile, vital signs stable, no acute distress  Heent-Normal, atraumatic, neck nontender  [employer]-rrr, no murmur  Lungs-clear, no wheezing or rhonchi  Abdomen-soft, non tender, no rebound or guarding  Neuro-no gross deficits, normal sensation  Skin-no rash or lacerations    Labs:    Reviewed    68 year old female cancer patient presents to rule out a PE.  Patient does have a history of IV contrast.  She will be admitted to Internal Medicine to be H pre treated for receiving contrast.

## 2019-03-11 NOTE — ED Nurses Note (Signed)
Report given to [facility], RN  03/11/2019, 22:07

## 2019-03-11 NOTE — ED Provider Notes (Signed)
[facility]  Emergency Department  Provider Note             Encounter Diagnosis   Name Primary?   Marland Kitchen Dyspnea, unspecified type Yes       ED Course/Medical Decision Making:      Discussion was had with attending physician, Dr. Chrissie Noa, whom also saw and evaluated the patient.       Kelsey [name] is a 68 y.o. female who presents to the Emergency Department with a chief complaint of shortness of breath.    EKG with No ischemic changes   Troponin negative; BNP mildly elevated; INR elevated consistent with Eliquis, remainder of labs unremarkable.    Given her hx of cancer and previous clots there is concern for pulmonary embolism, however had IV contrast dye reaction.    Premedications started and patient is admitted to hospital service for CT scan and premedications.    Patient remained stable throughout the visit.    Results were discussed with the patient and patient's family.  They were given the opportunity to ask questions. Patient and family were agreeable to plan.      Consults:   Medicine hospitalist      Disposition: Admitted      Patient will be admitted to Hospitalist 7, Dr. Freddi Che,  service for further evaluation and management.        New Prescriptions    No medications on file       Chief Complaint:  Patient presents with     Chief Complaint   Patient presents with   . Chest Pain      Reports from [place] with c/o chest pain and weakness. Reports hx of Afib.   . Weakness         HPI    Kelsey [name], date of birth 04-15-1951, is a 68 y.o. female who presents to the Emergency Department via POV with c/o chest pain x2 weeks. Pt is accompanied by her husband. The patient has a hx of lung cancer and was going to get infusion when she was sent to the ED for further evaluation due to these complaints.  Chest pain is substernal and dull/aching without radiation.  She notes she gets infusions every 2 weeks; infusion was held today. Pt reports she is tired all the time.   She states the  chest pain is constant and in the middle of her chest. Pt notes it is worse with movement and better with rest. Pt reports shortness of breath. She notes she takes breathing treatments, but hasn't had one today. Pt denies abdominal pain and nausea. The patient reports a hx of a-fib and is on Eliquis. Pt reports she has port access. PMH of HTN, COPD, cervical cancer, colon cancer, and asthma.       Review of Systems   Constitutional: Positive for fatigue. Negative for chills, diaphoresis and fever.   HENT: Negative for congestion, ear discharge, rhinorrhea and sore throat.    Eyes: Negative for discharge and redness.   Respiratory: Positive for shortness of breath. Negative for cough and wheezing.    Cardiovascular: Positive for chest pain. Negative for palpitations and leg swelling.   Gastrointestinal: Negative for abdominal pain, diarrhea, nausea and vomiting.   Genitourinary: Negative for dysuria and hematuria.   Musculoskeletal: Negative for arthralgias, gait problem and myalgias.   Skin: Negative for pallor and rash.   Neurological: Negative for weakness, light-headedness, numbness and headaches.   Psychiatric/Behavioral: Negative for agitation and confusion.  All other systems reviewed and are negative.      Physical Exam   Constitutional: She is oriented to person, place, and time. She appears well-developed and well-nourished. No distress.   Chronically ill appearing   HENT:   Head: Normocephalic and atraumatic.   Mouth/Throat: No oropharyngeal exudate.   Eyes: Pupils are equal, round, and reactive to light. Conjunctivae are normal. Right eye exhibits no discharge. Left eye exhibits no discharge. No scleral icterus.   Neck: Normal range of motion. Neck supple. No JVD present. No tracheal deviation present.   Cardiovascular: Normal rate, regular rhythm, normal heart sounds and intact distal pulses.   No murmur heard.  Pulmonary/Chest: Effort normal. No stridor. No respiratory distress. She has wheezes (end  expiratory wheezes throughout). She has no rales. She exhibits no tenderness.   Port in right upper chest.    Abdominal: Soft. Bowel sounds are normal. She exhibits no distension. There is no abdominal tenderness. There is no rebound and no guarding.   Musculoskeletal: Normal range of motion.         General: No tenderness, deformity or edema.   Neurological: She is alert and oriented to person, place, and time. No cranial nerve deficit. She exhibits normal muscle tone. Coordination normal.   Skin: Skin is warm and dry. No rash noted. She is not diaphoretic. No erythema.   Psychiatric: She has a normal mood and affect. Her behavior is normal. Judgment and thought content normal.   Nursing note and vitals reviewed.      Vitals:  Filed Vitals:    03/11/19 1032 03/11/19 1103   BP: (!) 148/78    Pulse: 57    Resp: 18    Temp: 36 C (96.8 F)    SpO2: 97% 97%       Past Medical History:  Diagnosis     Past Medical History:   Diagnosis Date   . A-fib (CMS HCC)    . Arthropathy, unspecified, site unspecified    . Asthma    . Cancer (CMS HCC)     cervical   . Cataract    . Colon cancer (CMS [place]) 07/06/2014   . COPD (chronic obstructive pulmonary disease) (CMS HCC)    . Fistula    . HTN (hypertension)    . Hyperlipidemia    . Lung mass     with pleural effusion   . Obesity    . Sleep apnea    . Ventral hernia        Past Surgical History:  Past Surgical History:   Procedure Laterality Date   . Abscess drainage     . Bowel resection     . Cataract extraction     . Hx cervical cone biopsy      . Hx cesarean section     . Hx gastric bypass     . Hx hernia repair     . Hx hysterectomy     . Hx lap banding     . Hx laparotomy         Family History:   Family History   Problem Relation Age of Onset   . Diabetes Mother    . Heart Attack Mother    . Diabetes Maternal Grandmother    . Diabetes Maternal Grandfather    . Diabetes Father    . Stroke Father        Social History     Social History     Tobacco Use   .  Smoking status:  Former Research scientist (life sciences)   . Smokeless tobacco: Never Used   . Tobacco comment: quit 10 yrs ago   Substance Use Topics   . Alcohol use: Not Currently     Comment: rarely   . Drug use: No       Social History Main Topics     Social History     Substance and Sexual Activity   Drug Use No       Allergies   Allergen Reactions   . Shellfish Containing Products Shortness of Breath, Rash and Itching     scallops   . Vancomycin Shortness of Breath   . Barium Iodide    . Iodine And Iodide Containing Products      Itching, shortness of breath        Diagnostics:    Labs:    Today's ED labs reviewed.  Significant results noted above.    Radiology:  XR AP MOBILE CHEST   Final Result   1. Abnormal right perihilar opacity, likely corresponding to lung   consolidation demonstrated on prior chest CT of February 17, 2019, though   difficult to evaluate for interval change given differences in modality.   2. Small right pleural effusion.            Radiologist location ID: [id number]         CT ANGIO CHEST FOR PULMONARY EMBOLUS W IV CONTRAST    (Results Pending)       EKG:  12 lead EKG interpreted by me shows: sinus rhythm, rate 60's, nonspecific T wave changes in lateral lead, occasional PVC            Chart completed after conclusion of patient care due to time constraints of direct patient care during shift.  Chart was dictated using voice recognition software, which may lead to minor grammatical or syntax errors.     I am scribing for, and in the presence of, Dr. Marcello Moores for services provided on 03/11/2019.  Donia Guiles, [place], [place]  03/11/2019, 11:29    I personally performed the services described in this documentation, as scribed  in my presence, and it is both accurate  and complete.    Mallie Darting, MD 03/11/2019  Emergency Medicine, PGY-3  [facility]

## 2019-03-11 NOTE — ED Nurses Note (Signed)
Pt going to [phone]

## 2019-03-12 ENCOUNTER — Other Ambulatory Visit (HOSPITAL_COMMUNITY): Payer: Self-pay | Admitting: Internal Medicine

## 2019-03-12 ENCOUNTER — Other Ambulatory Visit: Payer: Self-pay

## 2019-03-12 ENCOUNTER — Observation Stay (HOSPITAL_COMMUNITY): Payer: Medicare Other

## 2019-03-12 LAB — CBC WITH DIFF
BASOPHIL #: 0 10*3/uL (ref 0.00–0.20)
BASOPHIL %: 0 %
EOSINOPHIL #: 0 10*3/uL (ref 0.00–0.50)
EOSINOPHIL %: 0 %
HCT: 37.8 % (ref 34.6–46.2)
HGB: 12.5 g/dL (ref 11.8–15.8)
LYMPHOCYTE #: 0.6 10*3/uL — ABNORMAL LOW (ref 0.90–3.40)
LYMPHOCYTE %: 9 %
MCH: 24.8 pg — ABNORMAL LOW (ref 27.6–33.2)
MCHC: 33 g/dL (ref 32.6–35.4)
MCV: 75.3 fL — ABNORMAL LOW (ref 82.3–96.7)
MONOCYTE #: 0.3 10*3/uL (ref 0.20–0.90)
MONOCYTE %: 4 %
MPV: 8.6 fL (ref 6.6–10.2)
NEUTROPHIL #: 6 10*3/uL (ref 1.50–6.40)
NEUTROPHIL %: 87 %
PLATELETS: 211 10*3/uL (ref 140–440)
RBC: 5.02 10*6/uL (ref 3.80–5.24)
RDW: 18.8 % — ABNORMAL HIGH (ref 12.4–15.2)
WBC: 6.9 10*3/uL (ref 3.5–10.3)

## 2019-03-12 LAB — BASIC METABOLIC PANEL
ANION GAP: 8 mmol/L
BUN/CREA RATIO: 14
BUN: 11 mg/dL (ref 10–25)
CALCIUM: 9.1 mg/dL (ref 8.8–10.3)
CHLORIDE: 103 mmol/L (ref 98–111)
CO2 TOTAL: 27 mmol/L (ref 21–35)
CREATININE: 0.78 mg/dL (ref <=1.30)
ESTIMATED GFR: >60 mL/min/{1.73_m2}
GLUCOSE: 152 mg/dL — ABNORMAL HIGH (ref 70–110)
POTASSIUM: 3.6 mmol/L (ref 3.5–5.0)
SODIUM: 138 mmol/L (ref 135–145)

## 2019-03-12 LAB — ECG 12 LEAD - ED USE
Calculated P Axis: 70 deg
Calculated T Axis: 132 deg
EKG Severity: ABNORMAL
Heart Rate: 68 {beats}/min
I 40 Axis: 44 deg
PR Interval: 187 ms
QRS Axis: 58 deg
QRS Duration: 111 ms
QT Interval: 454 ms
QTC Calculation: 483 ms
ST Axis: 110 deg
T 40 Axis: 156 deg

## 2019-03-12 LAB — PHOSPHORUS: PHOSPHORUS: 3.2 mg/dL (ref 2.5–4.5)

## 2019-03-12 LAB — MAGNESIUM: MAGNESIUM: 1.9 mg/dL (ref 1.8–2.3)

## 2019-03-12 LAB — PT/INR: INR: 1.27 — ABNORMAL HIGH (ref 0.80–1.10)

## 2019-03-12 MED ORDER — IOVERSOL 320 MG IODINE/ML INTRAVENOUS SOLUTION
120.00 mL | INTRAVENOUS | Status: AC
Start: 2019-03-12 — End: 2019-03-12
  Administered 2019-03-12: 120 mL via INTRAVENOUS

## 2019-03-12 MED ORDER — SODIUM CHLORIDE 0.9 % INJECTION SOLUTION
10.00 mL | INTRAMUSCULAR | Status: DC
Start: 2019-03-12 — End: 2019-03-12

## 2019-03-12 NOTE — Ancillary Notes (Signed)
Pre medicated patient for iv allergy.   Pt came down with port that was not power port. Arrived at 8. Started scan 0145, delay due to floor obtaining new access.

## 2019-03-12 NOTE — Nurses Notes (Signed)
Pt understood to follow up with Dr. Deatra Canter on 7/29. Pt understood to stop taking Eiliquis 5 days prior to Thoracentesis. Port de-accessed without issue and bandage placed over port access site. Pt leaving in private vehicle. Levora Dredge, RN

## 2019-03-12 NOTE — Discharge Summary (Signed)
[place]      PATIENT NAME:  Kelsey, [name]  MRN:  [id number]  DOB:  16-Oct-1950      ENCOUNTER START DATE: 03/11/2019  INPATIENT ADMISSION DATE:   DISCHARGE DATE:  03/12/2019    ATTENDING PHYSICIAN: Felipa Evener, MD  PRIMARY CARE PHYSICIAN: [facility]     ADMISSION DIAGNOSIS: <principal problem not specified>  Chief Complaint   Patient presents with   . Chest Pain      Reports from [place] with c/o chest pain and weakness. Reports hx of Afib.   . Weakness       DISCHARGE DIAGNOSIS:   Hospital Problems) (* Primary Problem)    Diagnosis Date Noted   . Dyspnea 03/11/2019      Resolved Hospital Problems   No resolved problems to display.     Active Non-Hospital Problems    Diagnosis Date Noted   . Hx of echocardiogram 01/27/2019   . Atrial fibrillation (CMS HCC) 09/25/2018   . Atrial fibrillation with RVR (CMS HCC) 08/29/2018   . Asthma 08/09/2018   . Neutropenic fever (CMS HCC) 08/07/2018   . Malignant neoplasm of lung, unspecified laterality, unspecified part of lung (CMS [place]) 06/26/2018   . Right lower lobe lung mass 03/22/2018   . Colon cancer (CMS [place]) 07/06/2014   . HTN (hypertension)    . Hyperlipidemia    . Arthropathy, unspecified, site unspecified    . COPD (chronic obstructive pulmonary disease) (CMS HCC)    . Ventral hernia         DISCHARGE MEDICATIONS:     Current Discharge Medication List      CONTINUE these medications - NO CHANGES were made during your visit.      Details   * albuterol sulfate 2.5 mg /3 mL (0.083 %) Solution for Nebulization  Commonly known as:  PROVENTIL   2.5 mg, Nebulization, EVERY 6 HOURS  Qty:  30 Each  Refills:  3     * albuterol sulfate 90 mcg/actuation HFA Aerosol Inhaler  Commonly known as:  ProAir HFA   1-2 Puffs, Inhalation, EVERY 6 HOURS PRN  Qty:  1 Inhaler  Refills:  0     apixaban 5 mg Tablet  Commonly known as:  ELIQUIS   5 mg, Oral, 2 TIMES DAILY  Refills:  0     atorvastatin 10 mg Tablet  Commonly known as:  LIPITOR    10 mg, Oral, DAILY  Refills:  0     budesonide-formoteroL 160-4.5 mcg/actuation HFA Aerosol Inhaler  Commonly known as:  SYMBICORT   2 Puffs, Inhalation, 2 TIMES DAILY  Qty:  1 Inhaler  Refills:  0     carvediloL 3.125 mg Tablet  Commonly known as:  COREG   3.125 mg, Oral, 2 TIMES DAILY WITH FOOD  Refills:  0     docusate sodium 100 mg Capsule  Commonly known as:  COLACE   100 mg, Oral, 3 TIMES DAILY PRN  Refills:  0     flecainide 100 mg Tablet  Commonly known as:  TAMBOCOR   100 mg, Oral, 2 TIMES DAILY  Refills:  0     fluticasone propionate 50 mcg/actuation Spray, Suspension  Commonly known as:  FLONASE   1 Spray, Each Nostril, DAILY  Qty:  1 Bottle  Refills:  2     Linzess 72 mcg Capsule  Generic drug:  linaCLOtide   72 mcg, Oral, EVERY MORNING  Refills:  0     lisinopriL 20 mg Tablet  Commonly known as:  PRINIVIL   20 mg, Oral, DAILY  Refills:  0     loperamide 2 mg Capsule  Commonly known as:  IMODIUM   2 mg, Oral, EVERY 4 HOURS PRN  Refills:  0     loratadine 10 mg Tablet  Commonly known as:  CLARITIN   10 mg, Oral, DAILY  Qty:  90 Tab  Refills:  0     magnesium oxide 400 mg Tablet  Commonly known as:  MAG-OX   400 mg, Oral, DAILY  Refills:  0     omeprazole 20 mg Capsule, Delayed Release(E.C.)  Commonly known as:  PRILOSEC   40 mg, Oral, DAILY  Refills:  0     oxyCODONE 5 mg Tablet  Commonly known as:  ROXICODONE   5 mg, Oral, EVERY 6 HOURS PRN  Qty:  120 Tab  Refills:  0     potassium chloride 20 mEq Tab Sust.Rel. Particle/Crystal  Commonly known as:  K-DUR   20 mEq, Oral, DAILY, Takes 3 days a week  Refills:  0     prochlorperazine 10 mg Tablet  Commonly known as:  Compazine   10 mg, Oral, 4 TIMES DAILY PRN  Qty:  60 Tab  Refills:  5     rOPINIRole 0.5 mg Tablet  Commonly known as:  REQUIP   0.5 mg, Oral, NIGHTLY  Refills:  0         * This list has 2 medication(s) that are the same as other medications prescribed for you. Read the directions carefully, and ask your doctor or other care provider to review  them with you.                DISCHARGE INSTRUCTIONS:   No discharge procedures on file.      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  Given her presentation as well as no malignancy and history of thromboembolisms in the past oncology recommended the patient have a CT scan of the chest to evaluate for pulmonary embolism.  Patient was admitted to the hospitalist service for pretreatment prior to the CT based on her history of IV contrast allergy.  She was pretreated with prednisone as well as Benadryl.  Patient tolerated the CT scan well with no adverse reactions from the contrast.  The CT scan of the chest did not show a pulmonary embolism.  The patient states that she is clinically at baseline and wants to go home.  She is being discharged home today in stable condition.    Alert and oriented x3.  No acute distress.  Pleasant.  Mood affect and speech were all normal.  Pupils are equal reactive to light.  Extraocular movements intact.  Regular rate, no murmurs.  Intermittent wheezing.  Abdomen is soft, nontender, nondistended.  Extremities are warm.  Good pulses.  No edema.  Skin has no rashes.  No jaundice.  Cranial nerves 2-12 grossly intact.    COURSE IN HOSPITAL: Stable for discharge.    DOES PATIENT HAVE ADVANCED DIRECTIVES:  No, Information Offered and Refused    ADVANCED CARE PLANNING - Not applicable for  this patient    CONDITION ON DISCHARGE: Alert, Oriented and VS Stable    DISCHARGE DISPOSITION:  Home discharge     Copies sent to Care Team       Relationship Specialty Notifications [place], [place] PCP - General EXTERNAL  10/01/17     Phone: [phone] Fax: [phone]  [address] [place] [address]    Ali, [name], [facility]  12/19/18     Phone: [phone] Fax: [phone]         [address] [place] [address]    Newman Nip, RN    12/19/18     Johnanna Schneiders, MD  RADIATION ONCOLOGY  12/19/18     Phone: [phone] Fax: [phone]         [place] [address]    Watkins, Harrold, NP  NURSE PRACTITIONER  12/19/18     Phone: [phone] Fax: [phone]         [place] [address]    Gaye Pollack, FNP-BC  HEMATOLOGY/ONCOLOGY  12/19/18     Phone: [phone] Fax: [phone]         [address] [place] [address]    Clair Gulling, MD  THORACIC SURGERY ([place])  12/19/18     Phone: [phone] Fax: [phone]         [place] [address]        Total discharge time was 35 minutes.    Felipa Evener, MD

## 2019-03-12 NOTE — Care Plan (Signed)
End of shift note:  Patient alert and oriented.  Patient ambulates with a walker.  No complaints of pain.  States shortness of breath with exertion.  No questions, concerns, or complaints at this time.  Will continue to monitor.  Michiel Cowboy, RN

## 2019-03-17 ENCOUNTER — Ambulatory Visit (HOSPITAL_COMMUNITY): Admission: RE | Admit: 2019-03-17 | Payer: Medicare Other | Source: Ambulatory Visit | Admitting: Internal Medicine

## 2019-03-17 ENCOUNTER — Encounter (HOSPITAL_COMMUNITY): Payer: Self-pay | Admitting: Nurse Practitioner

## 2019-03-17 ENCOUNTER — Ambulatory Visit (HOSPITAL_COMMUNITY): Payer: Medicare Other

## 2019-03-17 NOTE — Nursing Note (Signed)
US Thoracentesis scheduled on 03/23/2019 for 8:15 am. Instructed patient to arrive @ 2 HOURS PRIOR am on the second floor of [facility]. Instructed patient to have a driver, nothing to eat or drink after midnight, & no blood thinners 5 days prior. Confirmed appointment date/time/location with patient verbally. Answered any questions patient may have.   Edwyna Shell, MA  03/17/2019, 16:29

## 2019-03-18 ENCOUNTER — Ambulatory Visit (HOSPITAL_COMMUNITY)
Admission: RE | Admit: 2019-03-18 | Discharge: 2019-03-18 | Disposition: A | Discharge status: Home / Self Care | Payer: Medicare Other | Source: Ambulatory Visit | Attending: Internal Medicine | Admitting: Internal Medicine

## 2019-03-18 ENCOUNTER — Other Ambulatory Visit: Payer: Self-pay

## 2019-03-18 ENCOUNTER — Ambulatory Visit (HOSPITAL_BASED_OUTPATIENT_CLINIC_OR_DEPARTMENT_OTHER): Payer: Medicare Other | Admitting: Internal Medicine

## 2019-03-18 ENCOUNTER — Other Ambulatory Visit (HOSPITAL_COMMUNITY): Payer: Self-pay | Admitting: Internal Medicine

## 2019-03-18 ENCOUNTER — Inpatient Hospital Stay (HOSPITAL_COMMUNITY)
Admission: RE | Admit: 2019-03-18 | Discharge: 2019-03-18 | Disposition: A | Discharge status: Still In Hospital | Payer: Medicare Other | Source: Ambulatory Visit | Attending: Nurse Practitioner | Admitting: Nurse Practitioner

## 2019-03-18 ENCOUNTER — Encounter (HOSPITAL_COMMUNITY): Payer: Self-pay | Admitting: Internal Medicine

## 2019-03-18 VITALS — BP 161/85 | HR 67 | Temp 97.1°F | Ht 62.0 in | Wt 267.0 lb

## 2019-03-18 VITALS — BP 161/85 | HR 67 | Temp 97.1°F | Resp 18

## 2019-03-18 DIAGNOSIS — T451X5A Adverse effect of antineoplastic and immunosuppressive drugs, initial encounter: Secondary | ICD-10-CM | POA: Insufficient documentation

## 2019-03-18 DIAGNOSIS — R7989 Other specified abnormal findings of blood chemistry: Secondary | ICD-10-CM

## 2019-03-18 DIAGNOSIS — Z5112 Encounter for antineoplastic immunotherapy: Secondary | ICD-10-CM | POA: Insufficient documentation

## 2019-03-18 DIAGNOSIS — C3490 Malignant neoplasm of unspecified part of unspecified bronchus or lung: Secondary | ICD-10-CM | POA: Insufficient documentation

## 2019-03-18 DIAGNOSIS — Z8249 Family history of ischemic heart disease and other diseases of the circulatory system: Secondary | ICD-10-CM | POA: Insufficient documentation

## 2019-03-18 DIAGNOSIS — Z7951 Long term (current) use of inhaled steroids: Secondary | ICD-10-CM | POA: Insufficient documentation

## 2019-03-18 DIAGNOSIS — E669 Obesity, unspecified: Secondary | ICD-10-CM | POA: Insufficient documentation

## 2019-03-18 DIAGNOSIS — E785 Hyperlipidemia, unspecified: Secondary | ICD-10-CM | POA: Insufficient documentation

## 2019-03-18 DIAGNOSIS — D701 Agranulocytosis secondary to cancer chemotherapy: Secondary | ICD-10-CM | POA: Insufficient documentation

## 2019-03-18 DIAGNOSIS — E876 Hypokalemia: Secondary | ICD-10-CM | POA: Insufficient documentation

## 2019-03-18 DIAGNOSIS — E039 Hypothyroidism, unspecified: Secondary | ICD-10-CM | POA: Insufficient documentation

## 2019-03-18 DIAGNOSIS — Z9884 Bariatric surgery status: Secondary | ICD-10-CM | POA: Insufficient documentation

## 2019-03-18 DIAGNOSIS — Z5111 Encounter for antineoplastic chemotherapy: Secondary | ICD-10-CM

## 2019-03-18 DIAGNOSIS — G8929 Other chronic pain: Secondary | ICD-10-CM | POA: Insufficient documentation

## 2019-03-18 DIAGNOSIS — J90 Pleural effusion, not elsewhere classified: Secondary | ICD-10-CM | POA: Insufficient documentation

## 2019-03-18 DIAGNOSIS — J449 Chronic obstructive pulmonary disease, unspecified: Secondary | ICD-10-CM | POA: Insufficient documentation

## 2019-03-18 DIAGNOSIS — R112 Nausea with vomiting, unspecified: Secondary | ICD-10-CM

## 2019-03-18 DIAGNOSIS — Z85038 Personal history of other malignant neoplasm of large intestine: Secondary | ICD-10-CM | POA: Insufficient documentation

## 2019-03-18 DIAGNOSIS — Z7901 Long term (current) use of anticoagulants: Secondary | ICD-10-CM | POA: Insufficient documentation

## 2019-03-18 DIAGNOSIS — G4730 Sleep apnea, unspecified: Secondary | ICD-10-CM | POA: Insufficient documentation

## 2019-03-18 DIAGNOSIS — Z79899 Other long term (current) drug therapy: Secondary | ICD-10-CM | POA: Insufficient documentation

## 2019-03-18 DIAGNOSIS — Z87891 Personal history of nicotine dependence: Secondary | ICD-10-CM | POA: Insufficient documentation

## 2019-03-18 DIAGNOSIS — R946 Abnormal results of thyroid function studies: Secondary | ICD-10-CM

## 2019-03-18 DIAGNOSIS — I10 Essential (primary) hypertension: Secondary | ICD-10-CM | POA: Insufficient documentation

## 2019-03-18 DIAGNOSIS — I4891 Unspecified atrial fibrillation: Secondary | ICD-10-CM | POA: Insufficient documentation

## 2019-03-18 DIAGNOSIS — C3411 Malignant neoplasm of upper lobe, right bronchus or lung: Secondary | ICD-10-CM | POA: Insufficient documentation

## 2019-03-18 LAB — COMPREHENSIVE METABOLIC PANEL, NON-FASTING
ALBUMIN: 3.3 g/dL (ref 3.2–4.6)
ALKALINE PHOSPHATASE: 90 U/L (ref 20–130)
ALT (SGPT): 21 U/L (ref <=52)
ANION GAP: 9 mmol/L
AST (SGOT): 23 U/L (ref <=35)
BILIRUBIN TOTAL: 0.4 mg/dL (ref 0.3–1.2)
BUN/CREA RATIO: 10
BUN: 7 mg/dL — ABNORMAL LOW (ref 10–25)
CALCIUM: 8.1 mg/dL — ABNORMAL LOW (ref 8.8–10.3)
CHLORIDE: 105 mmol/L (ref 98–111)
CO2 TOTAL: 25 mmol/L (ref 21–35)
CREATININE: 0.71 mg/dL (ref <=1.30)
ESTIMATED GFR: >60 mL/min/{1.73_m2}
GLUCOSE: 129 mg/dL — ABNORMAL HIGH (ref 70–110)
POTASSIUM: 3.1 mmol/L — ABNORMAL LOW (ref 3.5–5.0)
PROTEIN TOTAL: 5.5 g/dL — ABNORMAL LOW (ref 6.0–8.3)
SODIUM: 139 mmol/L (ref 135–145)

## 2019-03-18 LAB — CBC WITH DIFF
BASOPHIL #: 0 10*3/uL (ref 0.00–0.20)
BASOPHIL %: 1 %
EOSINOPHIL #: 0.1 10*3/uL (ref 0.00–0.50)
EOSINOPHIL %: 2 %
HCT: 36.4 % (ref 34.6–46.2)
HGB: 11.8 g/dL (ref 11.8–15.8)
LYMPHOCYTE #: 0.6 10*3/uL — ABNORMAL LOW (ref 0.90–3.40)
LYMPHOCYTE %: 10 %
MCH: 24.5 pg — ABNORMAL LOW (ref 27.6–33.2)
MCHC: 32.4 g/dL — ABNORMAL LOW (ref 32.6–35.4)
MCV: 75.6 fL — ABNORMAL LOW (ref 82.3–96.7)
MONOCYTE #: 0.7 10*3/uL (ref 0.20–0.90)
MONOCYTE %: 11 %
MPV: 7.8 fL (ref 6.6–10.2)
NEUTROPHIL #: 4.5 10*3/uL (ref 1.50–6.40)
NEUTROPHIL %: 76 %
PLATELETS: 210 10*3/uL (ref 140–440)
RBC: 4.82 10*6/uL (ref 3.80–5.24)
RDW: 18.2 % — ABNORMAL HIGH (ref 12.4–15.2)
WBC: 5.9 10*3/uL (ref 3.5–10.3)

## 2019-03-18 LAB — THYROID STIMULATING HORMONE (SENSITIVE TSH): TSH: 16.655 u[IU]/mL — ABNORMAL HIGH (ref 0.450–5.330)

## 2019-03-18 MED ORDER — POTASSIUM CHLORIDE ER 20 MEQ TABLET,EXTENDED RELEASE(PART/CRYST)
40.00 meq | ORAL_TABLET | ORAL | Status: AC
Start: 2019-03-18 — End: 2019-03-18
  Administered 2019-03-18: 40 meq via ORAL
  Filled 2019-03-18: qty 2

## 2019-03-18 MED ORDER — LEVOTHYROXINE 25 MCG TABLET
25.00 ug | ORAL_TABLET | Freq: Every morning | ORAL | 0 refills | Status: DC
Start: 2019-03-18 — End: 2019-07-29

## 2019-03-18 MED ORDER — SODIUM CHLORIDE 0.9 % INTRAVENOUS SOLUTION
10.0000 mg/kg | Freq: Once | INTRAVENOUS | Status: AC
Start: 2019-03-18 — End: 2019-03-18
  Administered 2019-03-18: 1250 mg via INTRAVENOUS
  Administered 2019-03-18: 0 mg via INTRAVENOUS
  Filled 2019-03-18: qty 25

## 2019-03-18 NOTE — Nurses Notes (Addendum)
[place] given as ordered. Port flushed with 10 cc NS and + br. Imfinzi infusion started over 1 hour with a filter. [name], RN  1226: Imfinzi infusion complete, port flushed with 10 ml ns +BR followed by 5 ml heparin. Port de accessed with huber intact and band-aid applied to site. Pt discharged via wheelchair pushed by staff. [name], RN

## 2019-03-18 NOTE — Progress Notes (Signed)
[place]  [place] [id number]        Encounter Date: 03/18/2019   9:45 AM EDT    Name:  Kelsey [name]  Age: 68 y.o.  DOB: 28-Apr-1951  Sex: female  PCP: [place]    Chief Complaint:    Chief Complaint   Patient presents with   . Lung Cancer     HISTORY OF PRESENT ILLNESS:   68 y.o. female with history of stage III colon cancer of present evaluation and management of lung cancer.    1. September 2015. Patient underwent sigmoidoscopy and was found to have polyp which was adenocarcinoma.    2. June 10, 2014. She was admitted for segmental resection of sigmoid colon. This revealed pT3, N1a, MX, low-grade  adenocarcinoma of the colon. The mass measuring 4 x 3 x 0.5 centimeters. It  was low-grade and there was no evidence of microsatellite instability by  histology. Margins were uninvolved and 4 lymph nodes only were removed which  only 1 was involved. This corresponding stage IIIB, T3, N1a, M0, low-grade  sigmoid cancer.     3. July 07, 2014. Recommended adjuvant chemotherapy with FOLFOX.    4. July 27, 2014. Patient was started on adjuvant chemotherapy with FOLFOX.  Patient received only 5 treatments and chemotherapy was stopped due to poor tolerance.  After that patient lost follow-up.      5. October 2019. Patient was admitted to hospital with concern for  bowel obstruction.  CT chest showed lung nodules.  Patient was managed conservatively and discharged home.    6. May 06, 2018.  Patient underwent CT-guided biopsy of lung lesion.  Pathology showed well-differentiated adenocarcinoma.  Section shows well-differentiated adenocarcinoma which positive for CK 7 and TTF 1, negative for P 63 and CK 20.     7. June 23, 2018. PET scan showed Hypermetabolic right lower lobe pulmonary mass compatible with malignancy.  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 Hepatomegaly and  steatosis.     8. July 23, 2018. Patient was started on chemoradiation.    9. October 30, 2018. PET scan showed Hypermetabolic right lower lobe mass with abnormal mediastinal and hilar nodes. There has been only mild improvement since earlier exam.    10. October 31, 2018. Patient was started on maintenance immunotherapy with durvalumab.    11. February 17, 2019. CT chest revealed Moderate right effusion new from 12/19/2018. No change in the right lower lung mass or a presumed postradiation changes in the right upper lung when compared to 12/19/2018. Mass in the right cardiophrenic angle increased from 10/30/2019 6). Consider an enlarged lymph node here. This is unchanged from 12/19/2018 but increased since size from 10/30/2018. Another possibility is an enlarging pericardial cyst (this region was not hypermetabolic 11/26/8117). Mild enlargement of the left adrenal gland stable from 10/30/2018. Approximate 9 cm upper abdominal ventral hernia containing nonobstructed bowel loops unchanged    12. March 06, 2019. PET CT revealed 1. The RIGHT lower lobe mass has decreased in size and uptake, which is consistent with response to therapy. However, abnormal uptake remains. This is consistent with residual tumor. Suspect post radiation change in the RIGHT pneumothorax. Consolidation in the medial  RIGHT lung has tumor level uptake. Differential: Post radiation change, infection, and tumor infiltration. Additional findings are concerning for tumor infiltration. Follow-up recommended. Increased RIGHT hilar density and uptake. Concerning for tumor spread. Post radiation change less likely. 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 A low-attenuation mass in the RIGHT cardiophrenic angle may be related. Lymph node metastasis less likely.      SUBJECTIVE:  Patient presents for follow-up and treatment.  Tolerating treatment well.  Denies any fevers or chills.  Denies any worsening shortness of breath or chest pain.    REVIEW OF SYSTEMS:  GENERAL:    Denies fever or chills, reports extreme fatigue   HEENT: no sore throat, congestion, blurry vision  Lungs:  Reports increased shortness of breath and dry cough  GI: no nausea, vomiting, constipation, no diarrhea  GU: No dysuria, urgency, increased frequency   Cardiac: reports chest pains/heaviness, palpitations, syncope,   Neuro: no weakness, loss of function, numbness, tingling  Skin: no rash  Musculoskeletal: No myalgias  Psychosocial: No depression, anxiety  Hematologic: No easy bruising, abnormal bleeding  All other review ROS negative except those mentioned above.     PATIENT HISTORY:  Past Medical History:   Diagnosis Date   . A-fib (CMS HCC)    . Arthropathy, unspecified, site unspecified    . Asthma    . Cancer (CMS HCC)     cervical   . Cataract    . Colon cancer (CMS [place]) 07/06/2014   . COPD (chronic obstructive pulmonary disease) (CMS HCC)    . Fistula    . HTN (hypertension)    . Hyperlipidemia    . Lung mass     with pleural effusion   . Obesity    . Sleep apnea    . Ventral hernia        Past Surgical History:   Procedure Laterality Date   . ABSCESS DRAINAGE     . BOWEL RESECTION     . CATARACT EXTRACTION     . HX CERVICAL CONE BIOPSY      . HX CESAREAN SECTION     . HX GASTRIC BYPASS      25 years ago   . [place] (x2)   . HX HYSTERECTOMY      late 1990s   . HX LAP BANDING      2013   . HX LAPAROTOMY      2013 to remove lap band       Family Medical History:     Problem Relation (Age of Onset)    Diabetes Mother, Maternal Grandmother, Maternal Grandfather, Father    Heart Attack Mother    Stroke Father          Current Outpatient Medications   Medication Sig   . albuterol sulfate (PROAIR HFA) 90 mcg/actuation Inhalation HFA Aerosol Inhaler Take 1-2 Puffs by inhalation Every 6 hours as needed   . albuterol sulfate (PROVENTIL) 2.5 mg /3 mL (0.083 %) Inhalation Solution for Nebulization 3 mL (2.5 mg total) by Nebulization route Every 6 hours   . apixaban (ELIQUIS) 5 mg Oral  Tablet Take 1 Tab (5 mg total) by mouth Twice daily   . atorvastatin (LIPITOR) 10 mg Oral Tablet Take 10 mg by mouth Once a day   . budesonide-formoteroL (SYMBICORT) 160-4.5 mcg/actuation Inhalation HFA Aerosol Inhaler Take 2 Puffs by inhalation Twice daily   . carvediloL (COREG) 3.125 mg Oral Tablet Take 3.125 mg by  mouth Twice daily with food   . docusate sodium (COLACE) 100 mg Oral Capsule Take 100 mg by mouth Three times a day as needed    . flecainide (TAMBOCOR) 100 mg Oral Tablet Take 100 mg by mouth Twice daily   . fluticasone propionate (FLONASE) 50 mcg/actuation Nasal Spray, Suspension 1 Spray by Each Nostril route Once a day   . linaCLOtide (LINZESS) 72 mcg Oral Capsule Take 72 mcg by mouth Every morning   . lisinopriL (PRINIVIL) 20 mg Oral Tablet Take 20 mg by mouth Once a day   . loperamide (IMODIUM) 2 mg Oral Capsule Take 2 mg by mouth Every 4 hours as needed   . loratadine (CLARITIN) 10 mg Oral Tablet Take 1 Tab (10 mg total) by mouth Once a day   . magnesium oxide (MAG-OX) 400 mg Oral Tablet Take 400 mg by mouth Once a day   . omeprazole (PRILOSEC) 20 mg Oral Capsule, Delayed Release(E.C.) Take 40 mg by mouth Once a day    . oxyCODONE (ROXICODONE) 5 mg Oral Tablet Take 1 Tab (5 mg total) by mouth Every 6 hours as needed for Pain   . potassium chloride (K-DUR) 20 mEq Oral Tab Sust.Rel. Particle/Crystal Take 20 mEq by mouth Once a day Takes 3 days a week   . prochlorperazine (COMPAZINE) 10 mg Oral Tablet Take 1 Tab (10 mg total) by mouth Four times a day as needed for Nausea/Vomiting   . rOPINIRole (REQUIP) 0.5 mg Oral Tablet Take 0.5 mg by mouth Every night     Social History     Socioeconomic History   . Marital status: Married     Spouse name: Not on file   . Number of children: Not on file   . Years of education: Not on file   . Highest education level: Not on file   Tobacco Use   . Smoking status: Former Research scientist (life sciences)   . Smokeless tobacco: Never Used   . Tobacco comment: quit 10 yrs ago   Substance and  Sexual Activity   . Alcohol use: Not Currently     Comment: rarely   . Drug use: No   Other Topics Concern   . Ability to Walk 1 Flight of Steps without SOB/CP No   . Ability To Do Own ADL's Yes       PHYSICAL EXAMINATION:  VITALS:   Vitals:    12/16/18 1027   BP: (!)136/82   Pulse: 74   Resp: 16   Temp: 36.6 C (97.9 F)   TempSrc: Tympanic   SpO2: 92%   Height: 1.575 m (5' 2")       PHYSICAL EXAM:   Consitutional: Appears fatigue  Eyes: EOMI. No discharge. No Jaundice.   ENT: mucous membranes dry. No posterior pharynx lesions.. Neck supple, No palpable masses   Heme/ Lymph: No Cervical, Inguinal, axillary lymh nodes. No Bruising.   Cardiovascular: S1, S2, No murmurs, rubs, or gallops. Irregular rhythm   Respiratory: Wheezes in bilateral lower lung fields. Right lower lung with auscultatory crackles  Abdomen: Normal Bowel Sounds, nontender nondistended. No hepatosplenomegaly.   Musculoskeletal: No Edema to the extremities.   Skin: Normal turgor. No Rashes,skin lesions   Psychiatry: Normal Affect   Neuro: No focal deficits. Alert and Oriented x 3      ENCOUNTER ORDERS:  No orders of the defined types were placed in this encounter.      LABORATORY/RADIOLOGICAL DATA: All pertinent labs/radiology were reviewed.   PET CT 03/06/19  IMPRESSION:  1. The RIGHT lower lobe mass has decreased in size and uptake, which is  consistent with response to therapy. However, abnormal uptake remains. This  is consistent with residual tumor.  2. Suspect post radiation change in the RIGHT pneumothorax. Consolidation  in the medial RIGHT lung has tumor level uptake. Differential: Post  radiation change, infection, and tumor infiltration. Additional findings  are concerning for tumor infiltration. Follow-up recommended.  3. Increased RIGHT hilar density and uptake. Concerning for tumor spread.  Post radiation change less likely.  4. 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 A low-attenuation mass in the  RIGHT cardiophrenic angle may be  related. Lymph node metastasis less  Likely.      ASSESSMENT AND PLAN:  68 y.o. female with history of stage III colon cancer status post 5 cycle of FOLFOX now presenting with stage III lung cancer.  I explained the reason for referred to Hematology/Oncology Clinic.    ICD-10-CM    1. Malignant neoplasm of lung, unspecified laterality, unspecified part of lung (CMS HCC) C34.90    2. Encounter for antineoplastic chemotherapy Z51.11        1. Adenocarcinoma lung:  Stage III.    CT brain to rule out metastatic disease.  NEGATIVE.   Started on chemoradiation carboplatin/Taxol along with radiation- July 23, 2018.  After completion of chemo radiation patient will get PET scan.  If continued to have good response will start maintenance immunotherapy for 1 year.   Completed 6/6 cycles of Taxol/Carboplatin and will start maintenance immunotherapy therapy following radiation therapy and PET scan    CHEMOTHERAPY:/RADIATION  CYCLE 8/26 DAY   DRUG DOSE TOTAL DOSE % ROUTE SCHEDULE   Carboplatin AUC- 2 205 mg    COMPLETED   Paclitaxel 45 mg/m2 108 mg    COMPLETED   Durvalumab                  Shared decision.  I discussed diagnosis, prognosis and treatment option with patient and family.  I had detailed discussion with patient regarding chemotherapy and radiation.  I also discussed brain scan to rule out metastatic disease before starting treatment.  I counseled patient regarding chemotherapy side effects and their management.  Patient understand and want to proceed with treatment.    - Ordered PET/CT to rule out progression of malignancy    2. Neutropenia:  Due to chemotherapy.   Resolved.    3. CINV    Zofran PRN    4. Transaminitis: RESOLVED   Instructed to avoid tylenol/alcoholic beverages    5. Thrombocytopenia:  Resolved    6. Dyspnea/fevers: RESOLVED   COVID negative    PET-CT increase in pleural effusion. Will proceed with US thoracentesis and review histology of fluid to rule out disease. If malignant cells  found on thoracentesis fluid, Dr. Deatra Canter will recommend changing treatment possibly.     7. Hypokalemia 3.3   Patient on K+ at home    8. Pain   Patient reports chronic in nature.   Oxycodone every 6 hours     9. Hypothyroidism   Elevated TSH    - will start Synthroid.      Return in about 2 weeks (around 04/01/2019) for cbc/diff, CMP, Treatment.      Vanetta Mulders, MD  Hematology/Oncology

## 2019-03-23 ENCOUNTER — Ambulatory Visit (HOSPITAL_COMMUNITY): Admission: RE | Admit: 2019-03-23 | Payer: Medicare Other | Source: Ambulatory Visit | Admitting: Internal Medicine

## 2019-03-23 ENCOUNTER — Ambulatory Visit (HOSPITAL_COMMUNITY): Payer: Medicare Other

## 2019-04-01 ENCOUNTER — Ambulatory Visit (HOSPITAL_COMMUNITY)
Admission: RE | Admit: 2019-04-01 | Discharge: 2019-04-01 | Disposition: A | Discharge status: Home / Self Care | Payer: Medicare Other | Source: Ambulatory Visit

## 2019-04-01 ENCOUNTER — Inpatient Hospital Stay
Admission: RE | Admit: 2019-04-01 | Discharge: 2019-04-01 | Disposition: A | Discharge status: Still In Hospital | Payer: Medicare Other | Source: Ambulatory Visit | Attending: Internal Medicine | Admitting: Internal Medicine

## 2019-04-01 ENCOUNTER — Encounter (HOSPITAL_COMMUNITY): Payer: Self-pay

## 2019-04-01 ENCOUNTER — Other Ambulatory Visit: Payer: Self-pay

## 2019-04-01 ENCOUNTER — Ambulatory Visit (HOSPITAL_BASED_OUTPATIENT_CLINIC_OR_DEPARTMENT_OTHER): Payer: Medicare Other | Admitting: Internal Medicine

## 2019-04-01 ENCOUNTER — Encounter (HOSPITAL_COMMUNITY): Payer: Self-pay | Admitting: Internal Medicine

## 2019-04-01 VITALS — BP 148/80 | HR 70 | Temp 96.8°F | Resp 18 | Ht 62.0 in | Wt 260.0 lb

## 2019-04-01 VITALS — BP 148/80 | HR 70 | Temp 96.8°F | Ht 62.0 in | Wt 260.0 lb

## 2019-04-01 DIAGNOSIS — C3490 Malignant neoplasm of unspecified part of unspecified bronchus or lung: Secondary | ICD-10-CM

## 2019-04-01 DIAGNOSIS — R946 Abnormal results of thyroid function studies: Secondary | ICD-10-CM

## 2019-04-01 DIAGNOSIS — E876 Hypokalemia: Secondary | ICD-10-CM

## 2019-04-01 DIAGNOSIS — G8929 Other chronic pain: Secondary | ICD-10-CM

## 2019-04-01 DIAGNOSIS — T451X5A Adverse effect of antineoplastic and immunosuppressive drugs, initial encounter: Secondary | ICD-10-CM

## 2019-04-01 DIAGNOSIS — E039 Hypothyroidism, unspecified: Secondary | ICD-10-CM | POA: Insufficient documentation

## 2019-04-01 DIAGNOSIS — R112 Nausea with vomiting, unspecified: Secondary | ICD-10-CM

## 2019-04-01 DIAGNOSIS — R7989 Other specified abnormal findings of blood chemistry: Secondary | ICD-10-CM

## 2019-04-01 DIAGNOSIS — Z5111 Encounter for antineoplastic chemotherapy: Secondary | ICD-10-CM | POA: Insufficient documentation

## 2019-04-01 LAB — COMPREHENSIVE METABOLIC PANEL, NON-FASTING
ALBUMIN: 3.4 g/dL (ref 3.2–4.6)
ALKALINE PHOSPHATASE: 85 U/L (ref 20–130)
ALT (SGPT): 16 U/L (ref <=52)
ANION GAP: 8 mmol/L
AST (SGOT): 24 U/L (ref <=35)
BILIRUBIN TOTAL: 0.6 mg/dL (ref 0.3–1.2)
BUN/CREA RATIO: 12
BUN: 9 mg/dL — ABNORMAL LOW (ref 10–25)
CALCIUM: 8.5 mg/dL — ABNORMAL LOW (ref 8.8–10.3)
CHLORIDE: 105 mmol/L (ref 98–111)
CO2 TOTAL: 26 mmol/L (ref 21–35)
CREATININE: 0.77 mg/dL (ref <=1.30)
ESTIMATED GFR: >60 mL/min/{1.73_m2}
GLUCOSE: 111 mg/dL — ABNORMAL HIGH (ref 70–110)
POTASSIUM: 3.4 mmol/L — ABNORMAL LOW (ref 3.5–5.0)
PROTEIN TOTAL: 5.7 g/dL — ABNORMAL LOW (ref 6.0–8.3)
SODIUM: 139 mmol/L (ref 135–145)

## 2019-04-01 LAB — CBC WITH DIFF
BASOPHIL #: 0 10*3/uL (ref 0.00–0.20)
BASOPHIL %: 1 %
EOSINOPHIL #: 0.1 10*3/uL (ref 0.00–0.50)
EOSINOPHIL %: 2 %
HCT: 35.3 % (ref 34.6–46.2)
HGB: 11.5 g/dL — ABNORMAL LOW (ref 11.8–15.8)
LYMPHOCYTE #: 0.6 10*3/uL — ABNORMAL LOW (ref 0.90–3.40)
LYMPHOCYTE %: 12 %
MCH: 24.6 pg — ABNORMAL LOW (ref 27.6–33.2)
MCHC: 32.6 g/dL (ref 32.6–35.4)
MCV: 75.4 fL — ABNORMAL LOW (ref 82.3–96.7)
MONOCYTE #: 0.7 10*3/uL (ref 0.20–0.90)
MONOCYTE %: 12 %
MPV: 7.3 fL (ref 6.6–10.2)
NEUTROPHIL #: 4.1 10*3/uL (ref 1.50–6.40)
NEUTROPHIL %: 74 %
PLATELETS: 218 10*3/uL (ref 140–440)
RBC: 4.67 10*6/uL (ref 3.80–5.24)
RDW: 17.4 % — ABNORMAL HIGH (ref 12.4–15.2)
WBC: 5.6 10*3/uL (ref 3.5–10.3)

## 2019-04-01 LAB — THYROID STIMULATING HORMONE (SENSITIVE TSH): TSH: 16.849 u[IU]/mL — ABNORMAL HIGH (ref 0.450–5.330)

## 2019-04-01 MED ORDER — SODIUM CHLORIDE 0.9 % INTRAVENOUS SOLUTION
10.0000 mg/kg | Freq: Once | INTRAVENOUS | Status: AC
Start: 2019-04-01 — End: 2019-04-01
  Administered 2019-04-01: 0 mg via INTRAVENOUS
  Administered 2019-04-01: 1250 mg via INTRAVENOUS
  Filled 2019-04-01: qty 25

## 2019-04-01 NOTE — Progress Notes (Signed)
[place] [id number]        Encounter Date: 04/01/2019   9:45 AM EDT    Name:  Kelsey [name]  Age: 68 y.o.  DOB: 06-11-1951  Sex: female  PCP: Leon    Chief Complaint:    Chief Complaint   Patient presents with   . Lung Cancer     HISTORY OF PRESENT ILLNESS:   68 y.o. female with history of stage III colon cancer of present evaluation and management of lung cancer.    1. September 2015. Patient underwent sigmoidoscopy and was found to have polyp which was adenocarcinoma.    2. June 10, 2014. She was admitted for segmental resection of sigmoid colon. This revealed pT3, N1a, MX, low-grade  adenocarcinoma of the colon. The mass measuring 4 x 3 x 0.5 centimeters. It  was low-grade and there was no evidence of microsatellite instability by  histology. Margins were uninvolved and 4 lymph nodes only were removed which  only 1 was involved. This corresponding stage IIIB, T3, N1a, M0, low-grade  sigmoid cancer.     3. July 07, 2014. Recommended adjuvant chemotherapy with FOLFOX.    4. July 27, 2014. Patient was started on adjuvant chemotherapy with FOLFOX.  Patient received only 5 treatments and chemotherapy was stopped due to poor tolerance.  After that patient lost follow-up.      5. October 2019. Patient was admitted to hospital with concern for  bowel obstruction.  CT chest showed lung nodules.  Patient was managed conservatively and discharged home.    6. May 06, 2018.  Patient underwent CT-guided biopsy of lung lesion.  Pathology showed well-differentiated adenocarcinoma.  Section shows well-differentiated adenocarcinoma which positive for CK 7 and TTF 1, negative for P 63 and CK 20.     7. June 23, 2018. PET scan showed Hypermetabolic right lower lobe pulmonary mass compatible with malignancy.  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 Hepatomegaly and  steatosis.     8. July 23, 2018. Patient was started on chemoradiation.    9. October 30, 2018. PET scan showed Hypermetabolic right lower lobe mass with abnormal mediastinal and hilar nodes. There has been only mild improvement since earlier exam.    10. October 31, 2018. Patient was started on maintenance immunotherapy with durvalumab.    11. February 17, 2019. CT chest revealed Moderate right effusion new from 12/19/2018. No change in the right lower lung mass or a presumed postradiation changes in the right upper lung when compared to 12/19/2018. Mass in the right cardiophrenic angle increased from 10/30/2019 6). Consider an enlarged lymph node here. This is unchanged from 12/19/2018 but increased since size from 10/30/2018. Another possibility is an enlarging pericardial cyst (this region was not hypermetabolic 9/38/1017). Mild enlargement of the left adrenal gland stable from 10/30/2018. Approximate 9 cm upper abdominal ventral hernia containing nonobstructed bowel loops unchanged    12. March 06, 2019. PET CT revealed 1. The RIGHT lower lobe mass has decreased in size and uptake, which is consistent with response to therapy. However, abnormal uptake remains. This is consistent with residual tumor. Suspect post radiation change in the RIGHT pneumothorax. Consolidation in the medial  RIGHT lung has tumor level uptake. Differential: Post radiation change, infection, and tumor infiltration. Additional findings are concerning for tumor infiltration. Follow-up recommended. Increased RIGHT hilar density and uptake. Concerning for tumor spread. Post radiation change less likely. 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 A low-attenuation mass in the RIGHT cardiophrenic angle may be related. Lymph node metastasis less likely.      SUBJECTIVE:  Patient presents for follow-up and treatment.  Tolerating treatment well.  Denies any worsening shortness of breath or chest pain.  Denies any fevers or chills.  Denies any headache, blurry  vision or dizziness.    REVIEW OF SYSTEMS:  GENERAL:   Denies fever or chills, reports extreme fatigue   HEENT: no sore throat, congestion, blurry vision  Lungs:  Reports increased shortness of breath and dry cough  GI: no nausea, vomiting, constipation, no diarrhea  GU: No dysuria, urgency, increased frequency   Cardiac: reports chest pains/heaviness, palpitations, syncope,   Neuro: no weakness, loss of function, numbness, tingling  Skin: no rash  Musculoskeletal: No myalgias  Psychosocial: No depression, anxiety  Hematologic: No easy bruising, abnormal bleeding  All other review ROS negative except those mentioned above.     PATIENT HISTORY:  Past Medical History:   Diagnosis Date   . A-fib (CMS HCC)    . Arthropathy, unspecified, site unspecified    . Asthma    . Cancer (CMS HCC)     cervical   . Cataract    . Colon cancer (CMS [place]) 07/06/2014   . COPD (chronic obstructive pulmonary disease) (CMS HCC)    . Fistula    . HTN (hypertension)    . Hyperlipidemia    . Lung mass     with pleural effusion   . Obesity    . Sleep apnea    . Ventral hernia        Past Surgical History:   Procedure Laterality Date   . ABSCESS DRAINAGE     . BOWEL RESECTION     . CATARACT EXTRACTION     . HX CERVICAL CONE BIOPSY      . HX CESAREAN SECTION     . HX GASTRIC BYPASS      25 years ago   . [place] (x2)   . HX HYSTERECTOMY      late 1990s   . HX LAP BANDING      2013   . HX LAPAROTOMY      2013 to remove lap band       Family Medical History:     Problem Relation (Age of Onset)    Diabetes Mother, Maternal Grandmother, Maternal Grandfather, Father    Heart Attack Mother    Stroke Father          Current Outpatient Medications   Medication Sig   . albuterol sulfate (PROAIR HFA) 90 mcg/actuation Inhalation HFA Aerosol Inhaler Take 1-2 Puffs by inhalation Every 6 hours as needed   . albuterol sulfate (PROVENTIL) 2.5 mg /3 mL (0.083 %) Inhalation Solution for Nebulization 3 mL (2.5 mg total) by Nebulization  route Every 6 hours   . apixaban (ELIQUIS) 5 mg Oral Tablet Take 1 Tab (5 mg total) by mouth Twice daily   . atorvastatin (LIPITOR) 10 mg Oral Tablet Take 10 mg by mouth Once a day   . budesonide-formoteroL (SYMBICORT) 160-4.5 mcg/actuation Inhalation HFA Aerosol Inhaler Take 2 Puffs by inhalation Twice daily   . carvediloL (COREG)  3.125 mg Oral Tablet Take 3.125 mg by mouth Twice daily with food   . docusate sodium (COLACE) 100 mg Oral Capsule Take 100 mg by mouth Three times a day as needed    . flecainide (TAMBOCOR) 100 mg Oral Tablet Take 100 mg by mouth Twice daily   . fluticasone propionate (FLONASE) 50 mcg/actuation Nasal Spray, Suspension 1 Spray by Each Nostril route Once a day   . levothyroxine (SYNTHROID) 25 mcg Oral Tablet Take 1 Tab (25 mcg total) by mouth Every morning   . linaCLOtide (LINZESS) 72 mcg Oral Capsule Take 72 mcg by mouth Every morning   . lisinopriL (PRINIVIL) 20 mg Oral Tablet Take 20 mg by mouth Once a day   . loperamide (IMODIUM) 2 mg Oral Capsule Take 2 mg by mouth Every 4 hours as needed   . loratadine (CLARITIN) 10 mg Oral Tablet Take 1 Tab (10 mg total) by mouth Once a day   . magnesium oxide (MAG-OX) 400 mg Oral Tablet Take 400 mg by mouth Once a day   . omeprazole (PRILOSEC) 20 mg Oral Capsule, Delayed Release(E.C.) Take 40 mg by mouth Once a day    . oxyCODONE (ROXICODONE) 5 mg Oral Tablet Take 1 Tab (5 mg total) by mouth Every 6 hours as needed for Pain   . potassium chloride (K-DUR) 20 mEq Oral Tab Sust.Rel. Particle/Crystal Take 20 mEq by mouth Once a day Takes 3 days a week   . prochlorperazine (COMPAZINE) 10 mg Oral Tablet Take 1 Tab (10 mg total) by mouth Four times a day as needed for Nausea/Vomiting   . rOPINIRole (REQUIP) 0.5 mg Oral Tablet Take 0.5 mg by mouth Every night     Social History     Socioeconomic History   . Marital status: Married     Spouse name: Not on file   . Number of children: Not on file   . Years of education: Not on file   . Highest education  level: Not on file   Tobacco Use   . Smoking status: Former Research scientist (life sciences)   . Smokeless tobacco: Never Used   . Tobacco comment: quit 10 yrs ago   Substance and Sexual Activity   . Alcohol use: Not Currently     Comment: rarely   . Drug use: No   Other Topics Concern   . Ability to Walk 1 Flight of Steps without SOB/CP No   . Ability To Do Own ADL's Yes       PHYSICAL EXAMINATION:  VITALS:   Vitals:    12/16/18 1027   BP: (!)136/82   Pulse: 74   Resp: 16   Temp: 36.6 C (97.9 F)   TempSrc: Tympanic   SpO2: 92%   Height: 1.575 m (_0 )       PHYSICAL EXAM:   Consitutional: Appears fatigue  Eyes: EOMI. No discharge. No Jaundice.   ENT: mucous membranes dry. No posterior pharynx lesions.. Neck supple, No palpable masses   Heme/ Lymph: No Cervical, Inguinal, axillary lymh nodes. No Bruising.   Cardiovascular: S1, S2, No murmurs, rubs, or gallops. Irregular rhythm   Respiratory: Wheezes in bilateral lower lung fields. Right lower lung with auscultatory crackles  Abdomen: Normal Bowel Sounds, nontender nondistended. No hepatosplenomegaly.   Musculoskeletal: No Edema to the extremities.   Skin: Normal turgor. No Rashes,skin lesions   Psychiatry: Normal Affect   Neuro: No focal deficits. Alert and Oriented x 3      ENCOUNTER ORDERS:  No  orders of the defined types were placed in this encounter.      LABORATORY/RADIOLOGICAL DATA: All pertinent labs/radiology were reviewed.   PET CT 03/06/19      IMPRESSION:  1. The RIGHT lower lobe mass has decreased in size and uptake, which is  consistent with response to therapy. However, abnormal uptake remains. This  is consistent with residual tumor.  2. Suspect post radiation change in the RIGHT pneumothorax. Consolidation  in the medial RIGHT lung has tumor level uptake. Differential: Post  radiation change, infection, and tumor infiltration. Additional findings  are concerning for tumor infiltration. Follow-up recommended.  3. Increased RIGHT hilar density and uptake. Concerning for  tumor spread.  Post radiation change less likely.  4. 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 A low-attenuation mass in the  RIGHT cardiophrenic angle may be related. Lymph node metastasis less  Likely.      ASSESSMENT AND PLAN:  68 y.o. female with history of stage III colon cancer status post 5 cycle of FOLFOX now presenting with stage III lung cancer.  I explained the reason for referred to Hematology/Oncology Clinic.    ICD-10-CM    1. Malignant neoplasm of lung, unspecified laterality, unspecified part of lung (CMS HCC) C34.90    2. Encounter for antineoplastic chemotherapy Z51.11    3. Hypothyroidism, unspecified type E03.9        1. Adenocarcinoma lung:  Stage III.    CT brain to rule out metastatic disease.  NEGATIVE.   Started on chemoradiation carboplatin/Taxol along with radiation- July 23, 2018.  After completion of chemo radiation patient will get PET scan.  If continued to have good response will start maintenance immunotherapy for 1 year.   Completed 6/6 cycles of Taxol/Carboplatin and will start maintenance immunotherapy therapy following radiation therapy and PET scan    CHEMOTHERAPY:/RADIATION  CYCLE 9/26 DAY   DRUG DOSE TOTAL DOSE % ROUTE SCHEDULE   Carboplatin AUC- 2 205 mg    COMPLETED   Paclitaxel 45 mg/m2 108 mg    COMPLETED   Durvalumab                    2. Neutropenia:  Due to chemotherapy.   Resolved.    3. CINV    Zofran PRN    4. Transaminitis: RESOLVED   Instructed to avoid tylenol/alcoholic beverages    5. Thrombocytopenia:  Resolved    6. Dyspnea/fevers: RESOLVED   COVID negative    PET-CT increase in pleural effusion. Will proceed with US thoracentesis and review histology of fluid to rule out disease. If malignant cells found on thoracentesis fluid, Dr. Deatra Canter will recommend changing treatment possibly.     7. Hypokalemia 3.4   Patient on K+ at home    8. Pain   Patient reports chronic in nature.   Oxycodone every 6 hours     9. Hypothyroidism   Elevated TSH     Currently on Synthroid.   Repeat TSH today pending.      Return in about 2 weeks (around 04/15/2019) for cbc/diff, CMP, Treatment.      Vanetta Mulders, MD  Hematology/Oncology

## 2019-04-01 NOTE — Nurses Notes (Addendum)
1120 right chest port flushed with 10 ml NS, +BR. imfinzi started to infuse over 21mins. Tobias Alexander, RN  1225 imfinzi complete. Pt denies complaints. Port flushed with 73ml NS, +BR and heparinized with 25ml of Heparin. Port deaccessed and dressed with Band-Aid. Pt left via wheel chair. Tobias Alexander, RN

## 2019-04-01 NOTE — Nurses Notes (Signed)
0924:  Right chest port accessed with 19 gauge 0.75" Huber under sterile procedure. Positive blood return noted. Lab specimens drawn. Flushed with 10 mL NS. Dressing applied and intact. Patient discharged ambulatory without any complaints to waiting room. Quintin Alto, LPN

## 2019-04-07 ENCOUNTER — Other Ambulatory Visit (HOSPITAL_COMMUNITY): Payer: Self-pay | Admitting: Nurse Practitioner

## 2019-04-07 ENCOUNTER — Telehealth (HOSPITAL_COMMUNITY): Payer: Self-pay | Admitting: Nurse Practitioner

## 2019-04-07 ENCOUNTER — Telehealth (HOSPITAL_COMMUNITY): Payer: Self-pay | Admitting: Internal Medicine

## 2019-04-07 MED ORDER — PREDNISONE 5 MG TABLET
ORAL_TABLET | ORAL | 0 refills | Status: AC
Start: 2019-04-07 — End: 2019-04-10

## 2019-04-07 NOTE — Telephone Encounter (Signed)
Pt called requesting to have a prescription for prednisone called into her pharmacy. Pt stated she is having some congestion. Would like a return call to discuss. [place], [place]

## 2019-04-07 NOTE — Telephone Encounter (Signed)
Patient stating she is a litle short off breath and having excess drainage. Dr. Deatra Canter okay with prednisone taper. Sent to RX. Instructed to let us know if dyspnea worsens as she is on immunetherapy. Omer Jack, APRN, FNP-C

## 2019-04-13 MED ADMIN — lactated Ringers intravenous solution: @ 08:00:00

## 2019-04-15 ENCOUNTER — Encounter (HOSPITAL_COMMUNITY): Payer: Self-pay | Admitting: Internal Medicine

## 2019-04-15 ENCOUNTER — Ambulatory Visit (HOSPITAL_BASED_OUTPATIENT_CLINIC_OR_DEPARTMENT_OTHER): Payer: Medicare Other | Admitting: Internal Medicine

## 2019-04-15 ENCOUNTER — Inpatient Hospital Stay (HOSPITAL_COMMUNITY)
Admission: RE | Admit: 2019-04-15 | Discharge: 2019-04-15 | Disposition: A | Discharge status: Still In Hospital | Payer: Medicare Other | Source: Ambulatory Visit | Attending: Nurse Practitioner | Admitting: Nurse Practitioner

## 2019-04-15 ENCOUNTER — Ambulatory Visit (HOSPITAL_COMMUNITY)
Admission: RE | Admit: 2019-04-15 | Discharge: 2019-04-15 | Disposition: A | Discharge status: Home / Self Care | Payer: Medicare Other | Source: Ambulatory Visit

## 2019-04-15 ENCOUNTER — Other Ambulatory Visit: Payer: Self-pay

## 2019-04-15 VITALS — BP 148/83 | HR 70 | Temp 97.0°F | Ht 62.0 in | Wt 260.0 lb

## 2019-04-15 DIAGNOSIS — G8929 Other chronic pain: Secondary | ICD-10-CM

## 2019-04-15 DIAGNOSIS — R112 Nausea with vomiting, unspecified: Secondary | ICD-10-CM

## 2019-04-15 DIAGNOSIS — Z5112 Encounter for antineoplastic immunotherapy: Secondary | ICD-10-CM | POA: Insufficient documentation

## 2019-04-15 DIAGNOSIS — C781 Secondary malignant neoplasm of mediastinum: Secondary | ICD-10-CM | POA: Insufficient documentation

## 2019-04-15 DIAGNOSIS — I10 Essential (primary) hypertension: Secondary | ICD-10-CM | POA: Insufficient documentation

## 2019-04-15 DIAGNOSIS — E876 Hypokalemia: Secondary | ICD-10-CM

## 2019-04-15 DIAGNOSIS — Z7901 Long term (current) use of anticoagulants: Secondary | ICD-10-CM | POA: Insufficient documentation

## 2019-04-15 DIAGNOSIS — T451X5A Adverse effect of antineoplastic and immunosuppressive drugs, initial encounter: Secondary | ICD-10-CM

## 2019-04-15 DIAGNOSIS — J449 Chronic obstructive pulmonary disease, unspecified: Secondary | ICD-10-CM | POA: Insufficient documentation

## 2019-04-15 DIAGNOSIS — Z85038 Personal history of other malignant neoplasm of large intestine: Secondary | ICD-10-CM | POA: Insufficient documentation

## 2019-04-15 DIAGNOSIS — E039 Hypothyroidism, unspecified: Secondary | ICD-10-CM | POA: Insufficient documentation

## 2019-04-15 DIAGNOSIS — D701 Agranulocytosis secondary to cancer chemotherapy: Secondary | ICD-10-CM | POA: Insufficient documentation

## 2019-04-15 DIAGNOSIS — Z8249 Family history of ischemic heart disease and other diseases of the circulatory system: Secondary | ICD-10-CM | POA: Insufficient documentation

## 2019-04-15 DIAGNOSIS — Z7951 Long term (current) use of inhaled steroids: Secondary | ICD-10-CM | POA: Insufficient documentation

## 2019-04-15 DIAGNOSIS — Z5111 Encounter for antineoplastic chemotherapy: Secondary | ICD-10-CM | POA: Insufficient documentation

## 2019-04-15 DIAGNOSIS — I4891 Unspecified atrial fibrillation: Secondary | ICD-10-CM | POA: Insufficient documentation

## 2019-04-15 DIAGNOSIS — R7989 Other specified abnormal findings of blood chemistry: Secondary | ICD-10-CM | POA: Insufficient documentation

## 2019-04-15 DIAGNOSIS — C3490 Malignant neoplasm of unspecified part of unspecified bronchus or lung: Secondary | ICD-10-CM

## 2019-04-15 DIAGNOSIS — Z87891 Personal history of nicotine dependence: Secondary | ICD-10-CM | POA: Insufficient documentation

## 2019-04-15 DIAGNOSIS — E669 Obesity, unspecified: Secondary | ICD-10-CM | POA: Insufficient documentation

## 2019-04-15 DIAGNOSIS — E785 Hyperlipidemia, unspecified: Secondary | ICD-10-CM | POA: Insufficient documentation

## 2019-04-15 DIAGNOSIS — Z9884 Bariatric surgery status: Secondary | ICD-10-CM | POA: Insufficient documentation

## 2019-04-15 DIAGNOSIS — Z79899 Other long term (current) drug therapy: Secondary | ICD-10-CM | POA: Insufficient documentation

## 2019-04-15 DIAGNOSIS — R946 Abnormal results of thyroid function studies: Secondary | ICD-10-CM | POA: Insufficient documentation

## 2019-04-15 DIAGNOSIS — C7801 Secondary malignant neoplasm of right lung: Secondary | ICD-10-CM | POA: Insufficient documentation

## 2019-04-15 DIAGNOSIS — C3431 Malignant neoplasm of lower lobe, right bronchus or lung: Secondary | ICD-10-CM | POA: Insufficient documentation

## 2019-04-15 DIAGNOSIS — G4730 Sleep apnea, unspecified: Secondary | ICD-10-CM | POA: Insufficient documentation

## 2019-04-15 DIAGNOSIS — Z6841 Body Mass Index (BMI) 40.0 and over, adult: Secondary | ICD-10-CM | POA: Insufficient documentation

## 2019-04-15 LAB — CBC WITH DIFF
BASOPHIL #: 0 10*3/uL (ref 0.00–0.20)
BASOPHIL %: 1 %
EOSINOPHIL #: 0.1 10*3/uL (ref 0.00–0.50)
EOSINOPHIL %: 1 %
HCT: 36.4 % (ref 34.6–46.2)
HGB: 11.8 g/dL (ref 11.8–15.8)
LYMPHOCYTE #: 0.5 10*3/uL — ABNORMAL LOW (ref 0.90–3.40)
LYMPHOCYTE %: 11 %
MCH: 24.6 pg — ABNORMAL LOW (ref 27.6–33.2)
MCHC: 32.5 g/dL — ABNORMAL LOW (ref 32.6–35.4)
MCV: 75.7 fL — ABNORMAL LOW (ref 82.3–96.7)
MONOCYTE #: 0.5 10*3/uL (ref 0.20–0.90)
MONOCYTE %: 11 %
MPV: 7.5 fL (ref 6.6–10.2)
NEUTROPHIL #: 3.7 10*3/uL (ref 1.50–6.40)
NEUTROPHIL %: 77 %
PLATELETS: 210 10*3/uL (ref 140–440)
RBC: 4.81 10*6/uL (ref 3.80–5.24)
RDW: 17.4 % — ABNORMAL HIGH (ref 12.4–15.2)
WBC: 4.8 10*3/uL (ref 3.5–10.3)

## 2019-04-15 LAB — COMPREHENSIVE METABOLIC PANEL, NON-FASTING
ALBUMIN: 3.5 g/dL (ref 3.2–4.6)
ALKALINE PHOSPHATASE: 79 U/L (ref 20–130)
ALT (SGPT): 23 U/L (ref <=52)
ANION GAP: 11 mmol/L
AST (SGOT): 25 U/L (ref <=35)
BILIRUBIN TOTAL: 0.5 mg/dL (ref 0.3–1.2)
BUN/CREA RATIO: 13
BUN: 10 mg/dL (ref 10–25)
CALCIUM: 8.2 mg/dL — ABNORMAL LOW (ref 8.8–10.3)
CHLORIDE: 104 mmol/L (ref 98–111)
CO2 TOTAL: 25 mmol/L (ref 21–35)
CREATININE: 0.79 mg/dL (ref <=1.30)
ESTIMATED GFR: >60 mL/min/{1.73_m2}
GLUCOSE: 196 mg/dL — ABNORMAL HIGH (ref 70–110)
POTASSIUM: 3.3 mmol/L — ABNORMAL LOW (ref 3.5–5.0)
PROTEIN TOTAL: 5.7 g/dL — ABNORMAL LOW (ref 6.0–8.3)
SODIUM: 140 mmol/L (ref 135–145)

## 2019-04-15 LAB — THYROID STIMULATING HORMONE (SENSITIVE TSH): TSH: 18.521 u[IU]/mL — ABNORMAL HIGH (ref 0.450–5.330)

## 2019-04-15 MED ORDER — SODIUM CHLORIDE 0.9 % INTRAVENOUS SOLUTION
10.0000 mg/kg | Freq: Once | INTRAVENOUS | Status: AC
Start: 2019-04-15 — End: 2019-04-15
  Administered 2019-04-15: 12:00:00 0 mg via INTRAVENOUS
  Administered 2019-04-15: 1250 mg via INTRAVENOUS
  Filled 2019-04-15: qty 25

## 2019-04-15 NOTE — Progress Notes (Signed)
[name] [id number]        Encounter Date: 04/15/2019   9:45 AM EDT    Name:  Kelsey [name]  Age: 68 y.o.  DOB: 10/16/1950  Sex: female  PCP: Lily    Chief Complaint:    Chief Complaint   Patient presents with   . Lung Cancer     HISTORY OF PRESENT ILLNESS:   68 y.o. female with history of stage III colon cancer of present evaluation and management of lung cancer.    1. September 2015. Patient underwent sigmoidoscopy and was found to have polyp which was adenocarcinoma.    2. June 10, 2014. She was admitted for segmental resection of sigmoid colon. This revealed pT3, N1a, MX, low-grade  adenocarcinoma of the colon. The mass measuring 4 x 3 x 0.5 centimeters. It  was low-grade and there was no evidence of microsatellite instability by  histology. Margins were uninvolved and 4 lymph nodes only were removed which  only 1 was involved. This corresponding stage IIIB, T3, N1a, M0, low-grade  sigmoid cancer.     3. July 07, 2014. Recommended adjuvant chemotherapy with FOLFOX.    4. July 27, 2014. Patient was started on adjuvant chemotherapy with FOLFOX.  Patient received only 5 treatments and chemotherapy was stopped due to poor tolerance.  After that patient lost follow-up.      5. October 2019. Patient was admitted to hospital with concern for  bowel obstruction.  CT chest showed lung nodules.  Patient was managed conservatively and discharged home.    6. May 06, 2018.  Patient underwent CT-guided biopsy of lung lesion.  Pathology showed well-differentiated adenocarcinoma.  Section shows well-differentiated adenocarcinoma which positive for CK 7 and TTF 1, negative for P 63 and CK 20.     7. June 23, 2018. PET scan showed Hypermetabolic right lower lobe pulmonary mass compatible with malignancy.  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 Hepatomegaly and  steatosis.     8. July 23, 2018. Patient was started on chemoradiation.    9. October 30, 2018. PET scan showed Hypermetabolic right lower lobe mass with abnormal mediastinal and hilar nodes. There has been only mild improvement since earlier exam.    10. October 31, 2018. Patient was started on maintenance immunotherapy with durvalumab.    11. February 17, 2019. CT chest revealed Moderate right effusion new from 12/19/2018. No change in the right lower lung mass or a presumed postradiation changes in the right upper lung when compared to 12/19/2018. Mass in the right cardiophrenic angle increased from 10/30/2019 6). Consider an enlarged lymph node here. This is unchanged from 12/19/2018 but increased since size from 10/30/2018. Another possibility is an enlarging pericardial cyst (this region was not hypermetabolic 4/80/1655). Mild enlargement of the left adrenal gland stable from 10/30/2018. Approximate 9 cm upper abdominal ventral hernia containing nonobstructed bowel loops unchanged    12. March 06, 2019. PET CT revealed 1. The RIGHT lower lobe mass has decreased in size and uptake, which is consistent with response to therapy. However, abnormal uptake remains. This is consistent with residual tumor. Suspect post radiation change in the RIGHT pneumothorax. Consolidation in the medial  RIGHT lung has tumor level uptake. Differential: Post radiation change, infection, and tumor infiltration. Additional findings are concerning for tumor infiltration. Follow-up recommended. Increased RIGHT hilar density and uptake. Concerning for tumor spread. Post radiation change less likely. 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 A low-attenuation mass in the RIGHT cardiophrenic angle may be related. Lymph node metastasis less likely.      SUBJECTIVE:  Patient presents for follow-up and treatment.  Tolerating treatment well.  Denies any worsening shortness of breath or chest pain.  Denies any fevers or chills.  Denies any headache, blurry  vision or dizziness.    REVIEW OF SYSTEMS:  GENERAL:   Denies fever or chills, reports extreme fatigue   HEENT: no sore throat, congestion, blurry vision  Lungs:  Reports increased shortness of breath and dry cough  GI: no nausea, vomiting, constipation, no diarrhea  GU: No dysuria, urgency, increased frequency   Cardiac: reports chest pains/heaviness, palpitations, syncope,   Neuro: no weakness, loss of function, numbness, tingling  Skin: no rash  Musculoskeletal: No myalgias  Psychosocial: No depression, anxiety  Hematologic: No easy bruising, abnormal bleeding  All other review ROS negative except those mentioned above.     PATIENT HISTORY:  Past Medical History:   Diagnosis Date   . A-fib (CMS HCC)    . Arthropathy, unspecified, site unspecified    . Asthma    . Cancer (CMS HCC)     cervical   . Cataract    . Colon cancer (CMS [place]) 07/06/2014   . COPD (chronic obstructive pulmonary disease) (CMS HCC)    . Fistula    . HTN (hypertension)    . Hyperlipidemia    . Lung mass     with pleural effusion   . Obesity    . Sleep apnea    . Ventral hernia        Past Surgical History:   Procedure Laterality Date   . ABSCESS DRAINAGE     . BOWEL RESECTION     . CATARACT EXTRACTION     . HX CERVICAL CONE BIOPSY      . HX CESAREAN SECTION     . HX GASTRIC BYPASS      25 years ago   . [place] (x2)   . HX HYSTERECTOMY      late 1990s   . HX LAP BANDING      2013   . HX LAPAROTOMY      2013 to remove lap band       Family Medical History:     Problem Relation (Age of Onset)    Diabetes Mother, Maternal Grandmother, Maternal Grandfather, Father    Heart Attack Mother    Stroke Father          Current Outpatient Medications   Medication Sig   . albuterol sulfate (PROAIR HFA) 90 mcg/actuation Inhalation HFA Aerosol Inhaler Take 1-2 Puffs by inhalation Every 6 hours as needed   . albuterol sulfate (PROVENTIL) 2.5 mg /3 mL (0.083 %) Inhalation Solution for Nebulization 3 mL (2.5 mg total) by Nebulization  route Every 6 hours   . apixaban (ELIQUIS) 5 mg Oral Tablet Take 1 Tab (5 mg total) by mouth Twice daily   . atorvastatin (LIPITOR) 10 mg Oral Tablet Take 10 mg by mouth Once a day   . budesonide-formoteroL (SYMBICORT) 160-4.5 mcg/actuation Inhalation HFA Aerosol Inhaler Take 2 Puffs by inhalation Twice daily   . carvediloL (COREG)  3.125 mg Oral Tablet Take 3.125 mg by mouth Twice daily with food   . docusate sodium (COLACE) 100 mg Oral Capsule Take 100 mg by mouth Three times a day as needed    . flecainide (TAMBOCOR) 100 mg Oral Tablet Take 100 mg by mouth Twice daily   . fluticasone propionate (FLONASE) 50 mcg/actuation Nasal Spray, Suspension 1 Spray by Each Nostril route Once a day   . levothyroxine (SYNTHROID) 25 mcg Oral Tablet Take 1 Tab (25 mcg total) by mouth Every morning   . linaCLOtide (LINZESS) 72 mcg Oral Capsule Take 72 mcg by mouth Every morning   . lisinopriL (PRINIVIL) 20 mg Oral Tablet Take 20 mg by mouth Once a day   . loperamide (IMODIUM) 2 mg Oral Capsule Take 2 mg by mouth Every 4 hours as needed   . loratadine (CLARITIN) 10 mg Oral Tablet Take 1 Tab (10 mg total) by mouth Once a day   . magnesium oxide (MAG-OX) 400 mg Oral Tablet Take 400 mg by mouth Once a day   . omeprazole (PRILOSEC) 20 mg Oral Capsule, Delayed Release(E.C.) Take 40 mg by mouth Once a day    . oxyCODONE (ROXICODONE) 5 mg Oral Tablet Take 1 Tab (5 mg total) by mouth Every 6 hours as needed for Pain   . potassium chloride (K-DUR) 20 mEq Oral Tab Sust.Rel. Particle/Crystal Take 20 mEq by mouth Once a day Takes 3 days a week   . prochlorperazine (COMPAZINE) 10 mg Oral Tablet Take 1 Tab (10 mg total) by mouth Four times a day as needed for Nausea/Vomiting   . rOPINIRole (REQUIP) 0.5 mg Oral Tablet Take 0.5 mg by mouth Every night     Social History     Socioeconomic History   . Marital status: Married     Spouse name: Not on file   . Number of children: Not on file   . Years of education: Not on file   . Highest education  level: Not on file   Tobacco Use   . Smoking status: Former Research scientist (life sciences)   . Smokeless tobacco: Never Used   . Tobacco comment: quit 10 yrs ago   Substance and Sexual Activity   . Alcohol use: Not Currently     Comment: rarely   . Drug use: No   Other Topics Concern   . Ability to Walk 1 Flight of Steps without SOB/CP No   . Ability To Do Own ADL's Yes       PHYSICAL EXAMINATION:  General Vitals: BP (!) 148/83   Pulse 70   Temp 36.1 C (97 F)   Ht 1.575 m (_0 )   Wt 118 kg (260 lb)   SpO2 95%   BMI 47.55 kg/m       PHYSICAL EXAM:   Consitutional: Appears fatigue  Eyes: EOMI. No discharge. No Jaundice.   ENT: mucous membranes dry. No posterior pharynx lesions.. Neck supple, No palpable masses   Heme/ Lymph: No Cervical, Inguinal, axillary lymh nodes. No Bruising.   Cardiovascular: S1, S2, No murmurs, rubs, or gallops.  Respiratory: clear B/L   Abdomen: Normal Bowel Sounds, nontender nondistended. No hepatosplenomegaly.   Musculoskeletal: No Edema to the extremities.   Skin: Normal turgor. No Rashes,skin lesions   Psychiatry: Normal Affect   Neuro: No focal deficits. Alert and Oriented x 3      ENCOUNTER ORDERS:  No orders of the defined types were placed in this encounter.      LABORATORY/RADIOLOGICAL  DATA: All pertinent labs/radiology were reviewed.   PET CT 03/06/19      IMPRESSION:  1. The RIGHT lower lobe mass has decreased in size and uptake, which is  consistent with response to therapy. However, abnormal uptake remains. This  is consistent with residual tumor.  2. Suspect post radiation change in the RIGHT pneumothorax. Consolidation  in the medial RIGHT lung has tumor level uptake. Differential: Post  radiation change, infection, and tumor infiltration. Additional findings  are concerning for tumor infiltration. Follow-up recommended.  3. Increased RIGHT hilar density and uptake. Concerning for tumor spread.  Post radiation change less likely.  4. 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 A low-attenuation  mass in the  RIGHT cardiophrenic angle may be related. Lymph node metastasis less  Likely.      ASSESSMENT AND PLAN:  68 y.o. female with history of stage III colon cancer status post 5 cycle of FOLFOX now presenting with stage III lung cancer.      ICD-10-CM    1. Malignant neoplasm of lung, unspecified laterality, unspecified part of lung (CMS HCC)  C34.90    2. Encounter for antineoplastic chemotherapy  Z51.11    3. Hypothyroidism, unspecified type  E03.9        1. Adenocarcinoma lung:  Stage III.    CT brain to rule out metastatic disease.  NEGATIVE.   Started on chemoradiation carboplatin/Taxol along with radiation- July 23, 2018.  After completion of chemo radiation patient will get PET scan.  If continued to have good response will start maintenance immunotherapy for 1 year.   Completed 6/6 cycles of Taxol/Carboplatin and will start maintenance immunotherapy therapy following radiation therapy and PET scan    CHEMOTHERAPY:/RADIATION  CYCLE 10/26 DAY   DRUG DOSE TOTAL DOSE % ROUTE SCHEDULE   Carboplatin AUC- 2 205 mg    COMPLETED   Paclitaxel 45 mg/m2 108 mg    COMPLETED   Durvalumab     Q 2 wks                2. Neutropenia:  Due to chemotherapy.   Resolved.    3. CINV    Zofran PRN    4. Transaminitis: RESOLVED   Instructed to avoid tylenol/alcoholic beverages    5. Thrombocytopenia:  Resolved    6. Dyspnea/fevers: RESOLVED  Better     7. Hypokalemia 3.3   Patient on K+ at home    8. Pain   Patient reports chronic in nature.   Oxycodone every 6 hours     9. Hypothyroidism   Elevated TSH    Currently on Synthroid.   Repeat TSH today pending.      Return in about 2 weeks (around 04/29/2019) for cbc/diff, CMP, Treatment.      Vanetta Mulders, MD  Hematology/Oncology

## 2019-04-15 NOTE — Nurses Notes (Addendum)
1054: Pharmacy called and reported Pt has a 5.8% weight decrease. Verbal order received from Dr. Deatra Canter to proceed with Imfinzi as ordered. Vanderbilt Ranieri, RN  [phone]: Right chest port flushed with 10 ml ns, -br. Imfinizi started to infuse over 1 hour. Mykah Bellomo, RN  [phone]: Infusion complete. Pt denies complaints. Port flushed with 27ml NS; -Br noted. Heparinized per policy. Site deaccessed intact and covered with a bandaid. Pt left infusion via wheelchair. Dennard Schaumann, RN

## 2019-04-16 LAB — HISTORICAL SURGICAL PATHOLOGY SPECIMEN

## 2019-04-29 ENCOUNTER — Other Ambulatory Visit: Payer: Self-pay

## 2019-04-29 ENCOUNTER — Inpatient Hospital Stay
Admission: RE | Admit: 2019-04-29 | Discharge: 2019-04-29 | Disposition: A | Discharge status: Still In Hospital | Payer: Medicare Other | Source: Ambulatory Visit | Attending: Internal Medicine | Admitting: Internal Medicine

## 2019-04-29 ENCOUNTER — Ambulatory Visit (HOSPITAL_BASED_OUTPATIENT_CLINIC_OR_DEPARTMENT_OTHER): Payer: Medicare Other | Admitting: Internal Medicine

## 2019-04-29 ENCOUNTER — Encounter (HOSPITAL_COMMUNITY): Payer: Self-pay | Admitting: Internal Medicine

## 2019-04-29 ENCOUNTER — Ambulatory Visit (HOSPITAL_COMMUNITY)
Admission: RE | Admit: 2019-04-29 | Discharge: 2019-04-29 | Disposition: A | Discharge status: Home / Self Care | Payer: Medicare Other | Source: Ambulatory Visit

## 2019-04-29 VITALS — BP 155/86 | HR 76 | Temp 97.2°F | Ht 62.0 in | Wt 270.6 lb

## 2019-04-29 DIAGNOSIS — G893 Neoplasm related pain (acute) (chronic): Secondary | ICD-10-CM

## 2019-04-29 DIAGNOSIS — T451X5A Adverse effect of antineoplastic and immunosuppressive drugs, initial encounter: Secondary | ICD-10-CM | POA: Insufficient documentation

## 2019-04-29 DIAGNOSIS — G8929 Other chronic pain: Secondary | ICD-10-CM | POA: Insufficient documentation

## 2019-04-29 DIAGNOSIS — C3490 Malignant neoplasm of unspecified part of unspecified bronchus or lung: Secondary | ICD-10-CM

## 2019-04-29 DIAGNOSIS — D701 Agranulocytosis secondary to cancer chemotherapy: Secondary | ICD-10-CM | POA: Insufficient documentation

## 2019-04-29 DIAGNOSIS — R7989 Other specified abnormal findings of blood chemistry: Secondary | ICD-10-CM

## 2019-04-29 DIAGNOSIS — R946 Abnormal results of thyroid function studies: Secondary | ICD-10-CM

## 2019-04-29 DIAGNOSIS — Z5111 Encounter for antineoplastic chemotherapy: Secondary | ICD-10-CM

## 2019-04-29 DIAGNOSIS — E039 Hypothyroidism, unspecified: Secondary | ICD-10-CM | POA: Insufficient documentation

## 2019-04-29 DIAGNOSIS — R112 Nausea with vomiting, unspecified: Secondary | ICD-10-CM

## 2019-04-29 DIAGNOSIS — E876 Hypokalemia: Secondary | ICD-10-CM | POA: Insufficient documentation

## 2019-04-29 LAB — CBC WITH DIFF
BASOPHIL #: 0 10*3/uL (ref 0.00–0.20)
BASOPHIL %: 1 %
EOSINOPHIL #: 0.1 10*3/uL (ref 0.00–0.50)
EOSINOPHIL %: 2 %
HCT: 36.6 % (ref 34.6–46.2)
HGB: 11.8 g/dL (ref 11.8–15.8)
LYMPHOCYTE #: 0.7 10*3/uL — ABNORMAL LOW (ref 0.90–3.40)
LYMPHOCYTE %: 12 %
MCH: 24.6 pg — ABNORMAL LOW (ref 27.6–33.2)
MCHC: 32.3 g/dL — ABNORMAL LOW (ref 32.6–35.4)
MCV: 76.2 fL — ABNORMAL LOW (ref 82.3–96.7)
MONOCYTE #: 0.7 10*3/uL (ref 0.20–0.90)
MONOCYTE %: 13 %
MPV: 7.4 fL (ref 6.6–10.2)
NEUTROPHIL #: 4.2 10*3/uL (ref 1.50–6.40)
NEUTROPHIL %: 73 %
PLATELETS: 241 10*3/uL (ref 140–440)
RBC: 4.8 10*6/uL (ref 3.80–5.24)
RDW: 17.3 % — ABNORMAL HIGH (ref 12.4–15.2)
WBC: 5.7 10*3/uL (ref 3.5–10.3)

## 2019-04-29 LAB — COMPREHENSIVE METABOLIC PANEL, NON-FASTING
ALBUMIN: 3.6 g/dL (ref 3.2–4.6)
ALKALINE PHOSPHATASE: 76 U/L (ref 20–130)
ALT (SGPT): 19 U/L (ref <=52)
ANION GAP: 8 mmol/L
AST (SGOT): 24 U/L (ref <=35)
BILIRUBIN TOTAL: 0.8 mg/dL (ref 0.3–1.2)
BUN/CREA RATIO: 14
BUN: 11 mg/dL (ref 10–25)
CALCIUM: 8.6 mg/dL — ABNORMAL LOW (ref 8.8–10.3)
CHLORIDE: 104 mmol/L (ref 98–111)
CO2 TOTAL: 26 mmol/L (ref 21–35)
CREATININE: 0.79 mg/dL (ref <=1.30)
ESTIMATED GFR: >60 mL/min/{1.73_m2}
GLUCOSE: 115 mg/dL — ABNORMAL HIGH (ref 70–110)
POTASSIUM: 3.5 mmol/L (ref 3.5–5.0)
PROTEIN TOTAL: 6 g/dL (ref 6.0–8.3)
SODIUM: 138 mmol/L (ref 135–145)

## 2019-04-29 LAB — THYROID STIMULATING HORMONE (SENSITIVE TSH): TSH: 14.271 u[IU]/mL — ABNORMAL HIGH (ref 0.450–5.330)

## 2019-04-29 MED ORDER — SODIUM CHLORIDE 0.9 % INTRAVENOUS SOLUTION
10.0000 mg/kg | Freq: Once | INTRAVENOUS | Status: AC
Start: 2019-04-29 — End: 2019-04-29
  Administered 2019-04-29: 13:00:00
  Administered 2019-04-29: 12:00:00 1250 mg via INTRAVENOUS
  Filled 2019-04-29: qty 25

## 2019-04-29 MED ORDER — OXYCODONE 5 MG TABLET
5.00 mg | ORAL_TABLET | Freq: Four times a day (QID) | ORAL | 0 refills | Status: DC | PRN
Start: 2019-04-29 — End: 2019-06-10

## 2019-04-29 NOTE — Nurses Notes (Addendum)
1209- port flushed with 10cc NS +br. Imfinzi started at a 1 hour rate. Lilyan Gilford, RN   1310: Imfinzi complete. Patient denies complaints. Port flushed with 10cc NS, 5cc hep, +BR Noted. [place] and Band-Aid applied to site. Patient discharged in stable condition. Tomasita Crumble, RN

## 2019-04-29 NOTE — Nurses Notes (Signed)
Hand accessed by Kyla Balzarine, LPN  Carmelina Noun, RN

## 2019-04-29 NOTE — Progress Notes (Signed)
[place]  [place] [address]        Encounter Date: 04/29/2019  10:30 AM EDT    Name:  Kelsey [name]  Age: 68 y.o.  DOB: Jan 08, 1951  Sex: female  PCP: [facility]    Chief Complaint:    Chief Complaint   Patient presents with   . Lung Cancer     HISTORY OF PRESENT ILLNESS:   68 y.o. female with history of stage III colon cancer of present evaluation and management of lung cancer.    1. September 2015. Patient underwent sigmoidoscopy and was found to have polyp which was adenocarcinoma.    2. June 10, 2014. She was admitted for segmental resection of sigmoid colon. This revealed pT3, N1a, MX, low-grade  adenocarcinoma of the colon. The mass measuring 4 x 3 x 0.5 centimeters. It  was low-grade and there was no evidence of microsatellite instability by  histology. Margins were uninvolved and 4 lymph nodes only were removed which  only 1 was involved. This corresponding stage IIIB, T3, N1a, M0, low-grade  sigmoid cancer.     3. July 07, 2014. Recommended adjuvant chemotherapy with FOLFOX.    4. July 27, 2014. Patient was started on adjuvant chemotherapy with FOLFOX.  Patient received only 5 treatments and chemotherapy was stopped due to poor tolerance.  After that patient lost follow-up.      5. October 2019. Patient was admitted to hospital with concern for  bowel obstruction.  CT chest showed lung nodules.  Patient was managed conservatively and discharged home.    6. May 06, 2018.  Patient underwent CT-guided biopsy of lung lesion.  Pathology showed well-differentiated adenocarcinoma.  Section shows well-differentiated adenocarcinoma which positive for CK 7 and TTF 1, negative for P 63 and CK 20.     7. June 23, 2018. PET scan showed Hypermetabolic right lower lobe pulmonary mass compatible with malignancy.  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 Hepatomegaly and  steatosis.     8. July 23, 2018. Patient was started on chemoradiation.    9. October 30, 2018. PET scan showed Hypermetabolic right lower lobe mass with abnormal mediastinal and hilar nodes. There has been only mild improvement since earlier exam.    10. October 31, 2018. Patient was started on maintenance immunotherapy with durvalumab.    11. February 17, 2019. CT chest revealed Moderate right effusion new from 12/19/2018. No change in the right lower lung mass or a presumed postradiation changes in the right upper lung when compared to 12/19/2018. Mass in the right cardiophrenic angle increased from 10/30/2019 6). Consider an enlarged lymph node here. This is unchanged from 12/19/2018 but increased since size from 10/30/2018. Another possibility is an enlarging pericardial cyst (this region was not hypermetabolic 4/65/6812). Mild enlargement of the left adrenal gland stable from 10/30/2018. Approximate 9 cm upper abdominal ventral hernia containing nonobstructed bowel loops unchanged    12. March 06, 2019. PET CT revealed 1. The RIGHT lower lobe mass has decreased in size and uptake, which is consistent with response to therapy. However, abnormal uptake remains. This is consistent with residual tumor. Suspect post radiation change in the RIGHT pneumothorax. Consolidation in the medial RIGHT  lung has tumor level uptake. Differential: Post radiation change, infection, and tumor infiltration. Additional findings are concerning for tumor infiltration. Follow-up recommended. Increased RIGHT hilar density and uptake. Concerning for tumor spread. Post radiation change less likely. 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 A low-attenuation mass in the RIGHT cardiophrenic angle may be related. Lymph node metastasis less likely.      SUBJECTIVE:  Patient presents for follow-up and treatment.  Denies any new issues since last visit.  Denies any worsening shortness of breath or chest pain.  Denies any fevers or chills.  Denies any  headache, blurry vision or dizziness.  Tolerating treatment well.    REVIEW OF SYSTEMS:  GENERAL:   Denies fever or chills  HEENT: no sore throat, congestion, blurry vision  Lungs:  Denies any worsening shortness of breath or cough.  GI: no nausea, vomiting, constipation, no diarrhea  GU: No dysuria, urgency, increased frequency   Cardiac:  Denies any chest pain, palpitations, syncope,   Neuro: no weakness, loss of function, numbness, tingling  Skin: no rash  Musculoskeletal: No myalgias  Psychosocial: No depression, anxiety  Hematologic: No easy bruising, abnormal bleeding  All other review ROS negative except those mentioned above.     PATIENT HISTORY:  Past Medical History:   Diagnosis Date   . A-fib (CMS HCC)    . Arthropathy, unspecified, site unspecified    . Asthma    . Cancer (CMS HCC)     cervical   . Cataract    . Colon cancer (CMS [place]) 07/06/2014   . COPD (chronic obstructive pulmonary disease) (CMS HCC)    . Fistula    . HTN (hypertension)    . Hyperlipidemia    . Lung mass     with pleural effusion   . Obesity    . Sleep apnea    . Ventral hernia        Past Surgical History:   Procedure Laterality Date   . ABSCESS DRAINAGE     . BOWEL RESECTION     . CATARACT EXTRACTION     . HX CERVICAL CONE BIOPSY      . HX CESAREAN SECTION     . HX GASTRIC BYPASS      25 years ago   . [place] (x2)   . HX HYSTERECTOMY      late 1990s   . HX LAP BANDING      2013   . HX LAPAROTOMY      2013 to remove lap band       Family Medical History:     Problem Relation (Age of Onset)    Diabetes Mother, Maternal Grandmother, Maternal Grandfather, Father    Heart Attack Mother    Stroke Father          Current Outpatient Medications   Medication Sig   . albuterol sulfate (PROAIR HFA) 90 mcg/actuation Inhalation HFA Aerosol Inhaler Take 1-2 Puffs by inhalation Every 6 hours as needed   . albuterol sulfate (PROVENTIL) 2.5 mg /3 mL (0.083 %) Inhalation Solution for Nebulization 3 mL (2.5 mg total) by  Nebulization route Every 6 hours   . apixaban (ELIQUIS) 5 mg Oral Tablet Take 1 Tab (5 mg total) by mouth Twice daily   . atorvastatin (LIPITOR) 10 mg Oral Tablet Take 10 mg by mouth Once a day   . budesonide-formoteroL (SYMBICORT) 160-4.5 mcg/actuation Inhalation HFA Aerosol Inhaler Take 2 Puffs by inhalation Twice daily   .  carvediloL (COREG) 3.125 mg Oral Tablet Take 3.125 mg by mouth Twice daily with food   . docusate sodium (COLACE) 100 mg Oral Capsule Take 100 mg by mouth Three times a day as needed    . flecainide (TAMBOCOR) 100 mg Oral Tablet Take 100 mg by mouth Twice daily   . fluticasone propionate (FLONASE) 50 mcg/actuation Nasal Spray, Suspension 1 Spray by Each Nostril route Once a day   . levothyroxine (SYNTHROID) 25 mcg Oral Tablet Take 1 Tab (25 mcg total) by mouth Every morning   . linaCLOtide (LINZESS) 72 mcg Oral Capsule Take 72 mcg by mouth Every morning   . lisinopriL (PRINIVIL) 20 mg Oral Tablet Take 20 mg by mouth Once a day   . loperamide (IMODIUM) 2 mg Oral Capsule Take 2 mg by mouth Every 4 hours as needed   . loratadine (CLARITIN) 10 mg Oral Tablet Take 1 Tab (10 mg total) by mouth Once a day   . magnesium oxide (MAG-OX) 400 mg Oral Tablet Take 400 mg by mouth Once a day   . omeprazole (PRILOSEC) 20 mg Oral Capsule, Delayed Release(E.C.) Take 40 mg by mouth Once a day    . oxyCODONE (ROXICODONE) 5 mg Oral Tablet Take 1 Tab (5 mg total) by mouth Every 6 hours as needed for Pain   . potassium chloride (K-DUR) 20 mEq Oral Tab Sust.Rel. Particle/Crystal Take 20 mEq by mouth Once a day Takes 3 days a week   . prochlorperazine (COMPAZINE) 10 mg Oral Tablet Take 1 Tab (10 mg total) by mouth Four times a day as needed for Nausea/Vomiting   . rOPINIRole (REQUIP) 0.5 mg Oral Tablet Take 0.5 mg by mouth Every night     Social History     Socioeconomic History   . Marital status: Married     Spouse name: Not on file   . Number of children: Not on file   . Years of education: Not on file   . Highest  education level: Not on file   Tobacco Use   . Smoking status: Former Research scientist (life sciences)   . Smokeless tobacco: Never Used   . Tobacco comment: quit 10 yrs ago   Substance and Sexual Activity   . Alcohol use: Not Currently     Comment: rarely   . Drug use: No   Other Topics Concern   . Ability to Walk 1 Flight of Steps without SOB/CP No   . Ability To Do Own ADL's Yes       PHYSICAL EXAMINATION:  General Vitals: BP (!) 155/86   Pulse 76   Temp 36.2 C (97.2 F)   Ht 1.575 m (5' 2")   Wt 123 kg (270 lb 9.6 oz)   SpO2 97%   BMI 49.49 kg/m       PHYSICAL EXAM:   Consitutional: Appears fatigue  Eyes: EOMI. No discharge. No Jaundice.   ENT: mucous membranes dry. No posterior pharynx lesions.. Neck supple, No palpable masses   Heme/ Lymph: No Cervical, Inguinal, axillary lymh nodes. No Bruising.   Cardiovascular: S1, S2, No murmurs, rubs, or gallops.  Respiratory: clear B/L   Abdomen: Normal Bowel Sounds, nontender nondistended. No hepatosplenomegaly.   Musculoskeletal: No Edema to the extremities.   Skin: Normal turgor. No Rashes,skin lesions   Psychiatry: Normal Affect   Neuro: No focal deficits. Alert and Oriented x 3      ENCOUNTER ORDERS:  Orders Placed This Encounter   . oxyCODONE (ROXICODONE) 5 mg Oral Tablet  LABORATORY/RADIOLOGICAL DATA: All pertinent labs/radiology were reviewed.   PET CT 03/06/19      IMPRESSION:  1. The RIGHT lower lobe mass has decreased in size and uptake, which is  consistent with response to therapy. However, abnormal uptake remains. This  is consistent with residual tumor.  2. Suspect post radiation change in the RIGHT pneumothorax. Consolidation  in the medial RIGHT lung has tumor level uptake. Differential: Post  radiation change, infection, and tumor infiltration. Additional findings  are concerning for tumor infiltration. Follow-up recommended.  3. Increased RIGHT hilar density and uptake. Concerning for tumor spread.  Post radiation change less likely.  4. Likely malignant RIGHT  pleural effusion. A low-attenuation mass in the  RIGHT cardiophrenic angle may be related. Lymph node metastasis less  Likely.      ASSESSMENT AND PLAN:  68 y.o. female with history of stage III colon cancer status post 5 cycle of FOLFOX now presenting with stage III lung cancer.      ICD-10-CM    1. Malignant neoplasm of lung, unspecified laterality, unspecified part of lung (CMS HCC)  C34.90    2. Encounter for antineoplastic chemotherapy  Z51.11    3. Cancer related pain  G89.3        1. Adenocarcinoma lung:  Stage III.    CT brain to rule out metastatic disease.  NEGATIVE.   Started on chemoradiation carboplatin/Taxol along with radiation- July 23, 2018.  After completion of chemo radiation patient will get PET scan.  If continued to have good response will start maintenance immunotherapy for 1 year.   Completed 6/6 cycles of Taxol/Carboplatin and will start maintenance immunotherapy therapy following radiation therapy and PET scan    CHEMOTHERAPY:/RADIATION  CYCLE 11/26 DAY   DRUG DOSE TOTAL DOSE % ROUTE SCHEDULE   Carboplatin AUC- 2 205 mg    COMPLETED   Paclitaxel 45 mg/m2 108 mg    COMPLETED   Durvalumab     Q 2 wks                2. Neutropenia:  Due to chemotherapy.   Resolved.    3. CINV    Zofran PRN    4. Transaminitis: RESOLVED   Instructed to avoid tylenol/alcoholic beverages    5. Thrombocytopenia:  Resolved    6. Dyspnea/fevers: RESOLVED  Better     7. Hypokalemia 3.5   Patient on K+ at home    8. Pain   Patient reports chronic in nature.   Oxycodone every 6 hours - refill today.    9. Hypothyroidism   Elevated TSH    Currently on Synthroid.        Return in about 2 weeks (around 05/13/2019) for cbc/diff, CMP, Treatment.      Vanetta Mulders, MD  Hematology/Oncology

## 2019-05-13 ENCOUNTER — Other Ambulatory Visit: Payer: Self-pay

## 2019-05-13 ENCOUNTER — Inpatient Hospital Stay (HOSPITAL_COMMUNITY)
Admission: RE | Admit: 2019-05-13 | Discharge: 2019-05-13 | Disposition: A | Discharge status: Still In Hospital | Payer: Medicare Other | Source: Ambulatory Visit | Attending: Nurse Practitioner | Admitting: Nurse Practitioner

## 2019-05-13 ENCOUNTER — Ambulatory Visit (HOSPITAL_COMMUNITY)
Admission: RE | Admit: 2019-05-13 | Discharge: 2019-05-13 | Disposition: A | Discharge status: Home / Self Care | Payer: Medicare Other | Source: Ambulatory Visit

## 2019-05-13 ENCOUNTER — Encounter (HOSPITAL_COMMUNITY): Payer: Self-pay | Admitting: Internal Medicine

## 2019-05-13 ENCOUNTER — Ambulatory Visit (HOSPITAL_BASED_OUTPATIENT_CLINIC_OR_DEPARTMENT_OTHER): Payer: Medicare Other | Admitting: Internal Medicine

## 2019-05-13 VITALS — BP 135/74 | HR 69 | Temp 98.4°F | Resp 18

## 2019-05-13 VITALS — BP 135/74 | HR 69 | Temp 97.7°F | Ht 62.0 in | Wt 270.0 lb

## 2019-05-13 DIAGNOSIS — C3490 Malignant neoplasm of unspecified part of unspecified bronchus or lung: Secondary | ICD-10-CM

## 2019-05-13 DIAGNOSIS — G893 Neoplasm related pain (acute) (chronic): Secondary | ICD-10-CM | POA: Insufficient documentation

## 2019-05-13 DIAGNOSIS — Z9884 Bariatric surgery status: Secondary | ICD-10-CM | POA: Insufficient documentation

## 2019-05-13 DIAGNOSIS — E1136 Type 2 diabetes mellitus with diabetic cataract: Secondary | ICD-10-CM | POA: Insufficient documentation

## 2019-05-13 DIAGNOSIS — Z5111 Encounter for antineoplastic chemotherapy: Secondary | ICD-10-CM | POA: Insufficient documentation

## 2019-05-13 DIAGNOSIS — E669 Obesity, unspecified: Secondary | ICD-10-CM | POA: Insufficient documentation

## 2019-05-13 DIAGNOSIS — Z85038 Personal history of other malignant neoplasm of large intestine: Secondary | ICD-10-CM | POA: Insufficient documentation

## 2019-05-13 DIAGNOSIS — Z79899 Other long term (current) drug therapy: Secondary | ICD-10-CM | POA: Insufficient documentation

## 2019-05-13 DIAGNOSIS — Z8249 Family history of ischemic heart disease and other diseases of the circulatory system: Secondary | ICD-10-CM | POA: Insufficient documentation

## 2019-05-13 DIAGNOSIS — Z7951 Long term (current) use of inhaled steroids: Secondary | ICD-10-CM | POA: Insufficient documentation

## 2019-05-13 DIAGNOSIS — E876 Hypokalemia: Secondary | ICD-10-CM | POA: Insufficient documentation

## 2019-05-13 DIAGNOSIS — J449 Chronic obstructive pulmonary disease, unspecified: Secondary | ICD-10-CM | POA: Insufficient documentation

## 2019-05-13 DIAGNOSIS — G4730 Sleep apnea, unspecified: Secondary | ICD-10-CM | POA: Insufficient documentation

## 2019-05-13 DIAGNOSIS — D701 Agranulocytosis secondary to cancer chemotherapy: Secondary | ICD-10-CM | POA: Insufficient documentation

## 2019-05-13 DIAGNOSIS — I10 Essential (primary) hypertension: Secondary | ICD-10-CM | POA: Insufficient documentation

## 2019-05-13 DIAGNOSIS — Z6839 Body mass index (BMI) 39.0-39.9, adult: Secondary | ICD-10-CM | POA: Insufficient documentation

## 2019-05-13 DIAGNOSIS — I4891 Unspecified atrial fibrillation: Secondary | ICD-10-CM | POA: Insufficient documentation

## 2019-05-13 DIAGNOSIS — E039 Hypothyroidism, unspecified: Secondary | ICD-10-CM | POA: Insufficient documentation

## 2019-05-13 DIAGNOSIS — Z7901 Long term (current) use of anticoagulants: Secondary | ICD-10-CM | POA: Insufficient documentation

## 2019-05-13 DIAGNOSIS — C3431 Malignant neoplasm of lower lobe, right bronchus or lung: Secondary | ICD-10-CM | POA: Insufficient documentation

## 2019-05-13 DIAGNOSIS — E785 Hyperlipidemia, unspecified: Secondary | ICD-10-CM | POA: Insufficient documentation

## 2019-05-13 DIAGNOSIS — Z5112 Encounter for antineoplastic immunotherapy: Secondary | ICD-10-CM | POA: Insufficient documentation

## 2019-05-13 DIAGNOSIS — R946 Abnormal results of thyroid function studies: Secondary | ICD-10-CM

## 2019-05-13 DIAGNOSIS — R7989 Other specified abnormal findings of blood chemistry: Secondary | ICD-10-CM

## 2019-05-13 LAB — CBC WITH DIFF
BASOPHIL #: 0 10*3/uL (ref 0.00–0.20)
BASOPHIL %: 1 %
EOSINOPHIL #: 0.1 10*3/uL (ref 0.00–0.50)
EOSINOPHIL %: 1 %
HCT: 36 % (ref 34.6–46.2)
HGB: 11.7 g/dL — ABNORMAL LOW (ref 11.8–15.8)
LYMPHOCYTE #: 0.6 10*3/uL — ABNORMAL LOW (ref 0.90–3.40)
LYMPHOCYTE %: 11 %
MCH: 24.5 pg — ABNORMAL LOW (ref 27.6–33.2)
MCHC: 32.5 g/dL — ABNORMAL LOW (ref 32.6–35.4)
MCV: 75.4 fL — ABNORMAL LOW (ref 82.3–96.7)
MONOCYTE #: 0.6 10*3/uL (ref 0.20–0.90)
MONOCYTE %: 11 %
MPV: 7.6 fL (ref 6.6–10.2)
NEUTROPHIL #: 4.5 10*3/uL (ref 1.50–6.40)
NEUTROPHIL %: 77 %
PLATELETS: 230 10*3/uL (ref 140–440)
RBC: 4.77 10*6/uL (ref 3.80–5.24)
RDW: 17.5 % — ABNORMAL HIGH (ref 12.4–15.2)
WBC: 5.9 10*3/uL (ref 3.5–10.3)

## 2019-05-13 LAB — COMPREHENSIVE METABOLIC PANEL, NON-FASTING
ALBUMIN: 3.5 g/dL (ref 3.2–4.6)
ALKALINE PHOSPHATASE: 78 U/L (ref 20–130)
ALT (SGPT): 20 U/L (ref <=52)
ANION GAP: 7 mmol/L
AST (SGOT): 25 U/L (ref <=35)
BILIRUBIN TOTAL: 0.7 mg/dL (ref 0.3–1.2)
BUN/CREA RATIO: 14
BUN: 12 mg/dL (ref 10–25)
CALCIUM: 8.7 mg/dL — ABNORMAL LOW (ref 8.8–10.3)
CHLORIDE: 105 mmol/L (ref 98–111)
CO2 TOTAL: 27 mmol/L (ref 21–35)
CREATININE: 0.83 mg/dL (ref <=1.30)
ESTIMATED GFR: >60 mL/min/{1.73_m2}
GLUCOSE: 130 mg/dL — ABNORMAL HIGH (ref 70–110)
POTASSIUM: 3.7 mmol/L (ref 3.5–5.0)
PROTEIN TOTAL: 5.9 g/dL — ABNORMAL LOW (ref 6.0–8.3)
SODIUM: 139 mmol/L (ref 135–145)

## 2019-05-13 LAB — THYROID STIMULATING HORMONE (SENSITIVE TSH): TSH: 14.809 u[IU]/mL — ABNORMAL HIGH (ref 0.450–5.330)

## 2019-05-13 MED ORDER — SODIUM CHLORIDE 0.9 % INTRAVENOUS SOLUTION
10.0000 mg/kg | Freq: Once | INTRAVENOUS | Status: AC
Start: 2019-05-13 — End: 2019-05-13
  Administered 2019-05-13: 1250 mg via INTRAVENOUS
  Administered 2019-05-13: 0 mg via INTRAVENOUS
  Filled 2019-05-13: qty 25

## 2019-05-13 NOTE — Progress Notes (Signed)
[address] [place]  [place] [id number]        Encounter Date: 05/13/2019  10:00 AM EDT    Name:  Kelsey [name]  Age: 68 y.o.  DOB: [birth year]  Sex: female  PCP: [facility]    Chief Complaint:    Chief Complaint   Patient presents with   . Lung Cancer     HISTORY OF PRESENT ILLNESS:   68 y.o. female with history of stage III colon cancer of present evaluation and management of lung cancer.    1. September 2015. Patient underwent sigmoidoscopy and was found to have polyp which was adenocarcinoma.    2. June 10, 2014. She was admitted for segmental resection of sigmoid colon. This revealed pT3, N1a, MX, low-grade  adenocarcinoma of the colon. The mass measuring 4 x 3 x 0.5 centimeters. It  was low-grade and there was no evidence of microsatellite instability by  histology. Margins were uninvolved and 4 lymph nodes only were removed which  only 1 was involved. This corresponding stage IIIB, T3, N1a, M0, low-grade  sigmoid cancer.     3. July 07, 2014. Recommended adjuvant chemotherapy with FOLFOX.    4. July 27, 2014. Patient was started on adjuvant chemotherapy with FOLFOX.  Patient received only 5 treatments and chemotherapy was stopped due to poor tolerance.  After that patient lost follow-up.      5. October 2019. Patient was admitted to hospital with concern for  bowel obstruction.  CT chest showed lung nodules.  Patient was managed conservatively and discharged home.    6. May 06, 2018.  Patient underwent CT-guided biopsy of lung lesion.  Pathology showed well-differentiated adenocarcinoma.  Section shows well-differentiated adenocarcinoma which positive for CK 7 and TTF 1, negative for P 63 and CK 20.     7. June 23, 2018. PET scan showed Hypermetabolic right lower lobe pulmonary mass compatible with malignancy.  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 Hepatomegaly and  steatosis.     8. July 23, 2018. Patient was started on chemoradiation.    9. October 30, 2018. PET scan showed Hypermetabolic right lower lobe mass with abnormal mediastinal and hilar nodes. There has been only mild improvement since earlier exam.    10. October 31, 2018. Patient was started on maintenance immunotherapy with durvalumab.    11. February 17, 2019. CT chest revealed Moderate right effusion new from 12/19/2018. No change in the right lower lung mass or a presumed postradiation changes in the right upper lung when compared to 12/19/2018. Mass in the right cardiophrenic angle increased from 10/30/2019 6). Consider an enlarged lymph node here. This is unchanged from 12/19/2018 but increased since size from 10/30/2018. Another possibility is an enlarging pericardial cyst (this region was not hypermetabolic 9/52/8413). Mild enlargement of the left adrenal gland stable from 10/30/2018. Approximate 9 cm upper abdominal ventral hernia containing nonobstructed bowel loops unchanged    12. March 06, 2019. PET CT revealed 1. The RIGHT lower lobe mass has decreased in size and uptake, which is consistent with response to therapy. However, abnormal uptake remains. This is consistent with residual tumor. Suspect post radiation change in the RIGHT pneumothorax. Consolidation in the medial RIGHT  lung has tumor level uptake. Differential: Post radiation change, infection, and tumor infiltration. Additional findings are concerning for tumor infiltration. Follow-up recommended. Increased RIGHT hilar density and uptake. Concerning for tumor spread. Post radiation change less likely. 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 A low-attenuation mass in the RIGHT cardiophrenic angle may be related. Lymph node metastasis less likely.      SUBJECTIVE:  Patient presents for follow-up and treatment.  Denies any new issues since last visit.  Denies any worsening shortness of breath or chest pain.  Denies any fevers or chills.      REVIEW OF  SYSTEMS:  GENERAL:   Denies fever or chills  HEENT: no sore throat, congestion, blurry vision  Lungs:  Denies any worsening shortness of breath or cough.  GI: no nausea, vomiting, constipation, no diarrhea  GU: No dysuria, urgency, increased frequency   Cardiac:  Denies any chest pain, palpitations, syncope,   Neuro: no weakness, loss of function, numbness, tingling  Skin: no rash  Musculoskeletal: No myalgias  Psychosocial: No depression, anxiety  Hematologic: No easy bruising, abnormal bleeding  All other review ROS negative except those mentioned above.     PATIENT HISTORY:  Past Medical History:   Diagnosis Date   . A-fib (CMS HCC)    . Arthropathy, unspecified, site unspecified    . Asthma    . Cancer (CMS HCC)     cervical   . Cataract    . Colon cancer (CMS [facility]) 07/06/2014   . COPD (chronic obstructive pulmonary disease) (CMS HCC)    . Fistula    . HTN (hypertension)    . Hyperlipidemia    . Lung mass     with pleural effusion   . Obesity    . Sleep apnea    . Ventral hernia        Past Surgical History:   Procedure Laterality Date   . ABSCESS DRAINAGE     . BOWEL RESECTION     . CATARACT EXTRACTION     . HX CERVICAL CONE BIOPSY      . HX CESAREAN SECTION     . HX GASTRIC BYPASS      25 years ago   . [place] (x2)   . HX HYSTERECTOMY      late 1990s   . HX LAP BANDING      2013   . HX LAPAROTOMY      2013 to remove lap band       Family Medical History:     Problem Relation (Age of Onset)    Diabetes Mother, Maternal Grandmother, Maternal Grandfather, Father    Heart Attack Mother    Stroke Father          Current Outpatient Medications   Medication Sig   . albuterol sulfate (PROVENTIL OR VENTOLIN OR PROAIR) 90 mcg/actuation Inhalation HFA Aerosol Inhaler INHALE 1 TO 2 PUFFS BY MOUTH EVERY 6 HOURS AS NEEDED   . albuterol sulfate (PROVENTIL) 2.5 mg /3 mL (0.083 %) Inhalation Solution for Nebulization 3 mL (2.5 mg total) by Nebulization route Every 6 hours   . apixaban (ELIQUIS) 5 mg  Oral Tablet Take 1 Tab (5 mg total) by mouth Twice daily   . atorvastatin (LIPITOR) 10 mg Oral Tablet Take 10 mg by mouth Once a day   . budesonide-formoteroL (SYMBICORT) 160-4.5 mcg/actuation Inhalation HFA Aerosol Inhaler Take 2 Puffs by inhalation Twice daily   . carvediloL (COREG)  3.125 mg Oral Tablet Take 3.125 mg by mouth Twice daily with food   . docusate sodium (COLACE) 100 mg Oral Capsule Take 100 mg by mouth Three times a day as needed    . flecainide (TAMBOCOR) 100 mg Oral Tablet Take 100 mg by mouth Twice daily   . fluticasone propionate (FLONASE) 50 mcg/actuation Nasal Spray, Suspension 1 Spray by Each Nostril route Once a day   . levothyroxine (SYNTHROID) 25 mcg Oral Tablet Take 1 Tab (25 mcg total) by mouth Every morning   . linaCLOtide (LINZESS) 72 mcg Oral Capsule Take 72 mcg by mouth Every morning   . lisinopriL (PRINIVIL) 20 mg Oral Tablet Take 20 mg by mouth Once a day   . loperamide (IMODIUM) 2 mg Oral Capsule Take 2 mg by mouth Every 4 hours as needed   . loratadine (CLARITIN) 10 mg Oral Tablet Take 1 Tab (10 mg total) by mouth Once a day   . magnesium oxide (MAG-OX) 400 mg Oral Tablet Take 400 mg by mouth Once a day   . omeprazole (PRILOSEC) 20 mg Oral Capsule, Delayed Release(E.C.) Take 40 mg by mouth Once a day    . oxyCODONE (ROXICODONE) 5 mg Oral Tablet Take 1 Tab (5 mg total) by mouth Every 6 hours as needed for Pain   . potassium chloride (K-DUR) 20 mEq Oral Tab Sust.Rel. Particle/Crystal Take 20 mEq by mouth Once a day Takes 3 days a week   . prochlorperazine (COMPAZINE) 10 mg Oral Tablet Take 1 Tab (10 mg total) by mouth Four times a day as needed for Nausea/Vomiting   . rOPINIRole (REQUIP) 0.5 mg Oral Tablet Take 0.5 mg by mouth Every night     Social History     Socioeconomic History   . Marital status: Married     Spouse name: Not on file   . Number of children: Not on file   . Years of education: Not on file   . Highest education level: Not on file   Tobacco Use   . Smoking status:  Former Research scientist (life sciences)   . Smokeless tobacco: Never Used   . Tobacco comment: quit 10 yrs ago   Substance and Sexual Activity   . Alcohol use: Not Currently     Comment: rarely   . Drug use: No   Other Topics Concern   . Ability to Walk 1 Flight of Steps without SOB/CP No   . Ability To Do Own ADL's Yes       PHYSICAL EXAMINATION:  General Vitals: BP 135/74   Pulse 69   Temp 36.5 C (97.7 F)   Ht 1.575 m (5' 2" )   Wt 122 kg (270 lb)   SpO2 94%   BMI 49.38 kg/m       PHYSICAL EXAM:   Consitutional: Appears fatigue  Eyes: EOMI. No discharge. No Jaundice.   ENT: mucous membranes dry. No posterior pharynx lesions.. Neck supple, No palpable masses   Heme/ Lymph: No Cervical, Inguinal, axillary lymh nodes. No Bruising.   Cardiovascular: S1, S2, No murmurs, rubs, or gallops.  Respiratory: clear B/L   Abdomen: Normal Bowel Sounds, nontender nondistended. No hepatosplenomegaly.   Musculoskeletal: No Edema to the extremities.   Skin: Normal turgor. No Rashes,skin lesions   Psychiatry: Normal Affect   Neuro: No focal deficits. Alert and Oriented x 3      ENCOUNTER ORDERS:  No orders of the defined types were placed in this encounter.      LABORATORY/RADIOLOGICAL DATA:  All pertinent labs/radiology were reviewed.   PET CT 03/06/19      IMPRESSION:  1. The RIGHT lower lobe mass has decreased in size and uptake, which is  consistent with response to therapy. However, abnormal uptake remains. This  is consistent with residual tumor.  2. Suspect post radiation change in the RIGHT pneumothorax. Consolidation  in the medial RIGHT lung has tumor level uptake. Differential: Post  radiation change, infection, and tumor infiltration. Additional findings  are concerning for tumor infiltration. Follow-up recommended.  3. Increased RIGHT hilar density and uptake. Concerning for tumor spread.  Post radiation change less likely.  4. 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 A low-attenuation mass in the  RIGHT cardiophrenic angle may be related.  Lymph node metastasis less  Likely.      ASSESSMENT AND PLAN:  68 y.o. female with history of stage III colon cancer status post 5 cycle of FOLFOX now presenting with stage III lung cancer.      ICD-10-CM    1. Malignant neoplasm of lung, unspecified laterality, unspecified part of lung (CMS HCC)  C34.90    2. Encounter for antineoplastic chemotherapy  Z51.11    3. Cancer related pain  G89.3    4. Hypothyroidism, unspecified type  E03.9        1. Adenocarcinoma lung:  Stage III.    CT brain to rule out metastatic disease.  NEGATIVE.   Started on chemoradiation carboplatin/Taxol along with radiation- July 23, 2018.  After completion of chemo radiation patient will get PET scan.  If continued to have good response will start maintenance immunotherapy for 1 year.   Completed 6/6 cycles of Taxol/Carboplatin and will start maintenance immunotherapy therapy following radiation therapy and PET scan    CHEMOTHERAPY:/RADIATION  CYCLE 12/26 DAY   DRUG DOSE TOTAL DOSE % ROUTE SCHEDULE   Carboplatin AUC- 2 205 mg    COMPLETED   Paclitaxel 45 mg/m2 108 mg    COMPLETED   Durvalumab     Q 2 wks                2. Neutropenia:  Due to chemotherapy.   Resolved.    3. CINV    Zofran PRN    4. Transaminitis: RESOLVED   Instructed to avoid tylenol/alcoholic beverages    5. Thrombocytopenia:  Resolved    6. Dyspnea/fevers: RESOLVED  Better     7. Hypokalemia   Patient on K+ at home   Potassium today 3.7.    8. Pain   Patient reports chronic in nature.   Oxycodone every 6 hours     9. Hypothyroidism   Elevated TSH - TSH today 14.8, stable.   Currently on Synthroid.        Return in about 2 weeks (around 05/27/2019) for cbc/diff, CMP, Treatment.      Vanetta Mulders, MD  Hematology/Oncology

## 2019-05-13 NOTE — Nurses Notes (Addendum)
1124- port flushed with 10cc NS +br. imfinzi started at a 1 hour rate. Lilyan Gilford, RN   [phone]- infusion complete. Port flushed with 10cc NS +br and 5cc heparin. Port deaccessed band-aid applied to site. Pt tol well, no complaints. Lilyan Gilford, RN

## 2019-05-14 ENCOUNTER — Encounter (HOSPITAL_COMMUNITY): Payer: Self-pay | Admitting: Internal Medicine

## 2019-05-15 ENCOUNTER — Other Ambulatory Visit (INDEPENDENT_AMBULATORY_CARE_PROVIDER_SITE_OTHER): Payer: Self-pay | Admitting: Cardiovascular Disease

## 2019-05-27 ENCOUNTER — Other Ambulatory Visit: Payer: Self-pay

## 2019-05-27 ENCOUNTER — Ambulatory Visit (HOSPITAL_COMMUNITY)
Admission: RE | Admit: 2019-05-27 | Discharge: 2019-05-27 | Disposition: A | Discharge status: Home / Self Care | Payer: Medicare Other | Source: Ambulatory Visit

## 2019-05-27 ENCOUNTER — Encounter (HOSPITAL_COMMUNITY): Payer: Self-pay | Admitting: Internal Medicine

## 2019-05-27 ENCOUNTER — Ambulatory Visit (HOSPITAL_BASED_OUTPATIENT_CLINIC_OR_DEPARTMENT_OTHER): Payer: Medicare Other | Admitting: Internal Medicine

## 2019-05-27 ENCOUNTER — Inpatient Hospital Stay
Admission: RE | Admit: 2019-05-27 | Discharge: 2019-05-27 | Disposition: A | Discharge status: Still In Hospital | Payer: Medicare Other | Source: Ambulatory Visit | Attending: Internal Medicine | Admitting: Internal Medicine

## 2019-05-27 VITALS — BP 156/90 | HR 65 | Temp 98.9°F | Resp 18

## 2019-05-27 VITALS — BP 156/90 | HR 65 | Temp 98.9°F | Ht 62.0 in | Wt 270.0 lb

## 2019-05-27 DIAGNOSIS — G893 Neoplasm related pain (acute) (chronic): Secondary | ICD-10-CM | POA: Insufficient documentation

## 2019-05-27 DIAGNOSIS — E876 Hypokalemia: Secondary | ICD-10-CM

## 2019-05-27 DIAGNOSIS — Z87891 Personal history of nicotine dependence: Secondary | ICD-10-CM | POA: Insufficient documentation

## 2019-05-27 DIAGNOSIS — R112 Nausea with vomiting, unspecified: Secondary | ICD-10-CM | POA: Insufficient documentation

## 2019-05-27 DIAGNOSIS — Z5111 Encounter for antineoplastic chemotherapy: Secondary | ICD-10-CM | POA: Insufficient documentation

## 2019-05-27 DIAGNOSIS — E119 Type 2 diabetes mellitus without complications: Secondary | ICD-10-CM | POA: Insufficient documentation

## 2019-05-27 DIAGNOSIS — Z85038 Personal history of other malignant neoplasm of large intestine: Secondary | ICD-10-CM | POA: Insufficient documentation

## 2019-05-27 DIAGNOSIS — Z9221 Personal history of antineoplastic chemotherapy: Secondary | ICD-10-CM | POA: Insufficient documentation

## 2019-05-27 DIAGNOSIS — C7801 Secondary malignant neoplasm of right lung: Secondary | ICD-10-CM | POA: Insufficient documentation

## 2019-05-27 DIAGNOSIS — G4730 Sleep apnea, unspecified: Secondary | ICD-10-CM | POA: Insufficient documentation

## 2019-05-27 DIAGNOSIS — T451X5A Adverse effect of antineoplastic and immunosuppressive drugs, initial encounter: Secondary | ICD-10-CM | POA: Insufficient documentation

## 2019-05-27 DIAGNOSIS — Z923 Personal history of irradiation: Secondary | ICD-10-CM | POA: Insufficient documentation

## 2019-05-27 DIAGNOSIS — I10 Essential (primary) hypertension: Secondary | ICD-10-CM | POA: Insufficient documentation

## 2019-05-27 DIAGNOSIS — G8929 Other chronic pain: Secondary | ICD-10-CM

## 2019-05-27 DIAGNOSIS — E039 Hypothyroidism, unspecified: Secondary | ICD-10-CM | POA: Insufficient documentation

## 2019-05-27 DIAGNOSIS — Z8249 Family history of ischemic heart disease and other diseases of the circulatory system: Secondary | ICD-10-CM | POA: Insufficient documentation

## 2019-05-27 DIAGNOSIS — E785 Hyperlipidemia, unspecified: Secondary | ICD-10-CM | POA: Insufficient documentation

## 2019-05-27 DIAGNOSIS — R7989 Other specified abnormal findings of blood chemistry: Secondary | ICD-10-CM

## 2019-05-27 DIAGNOSIS — E669 Obesity, unspecified: Secondary | ICD-10-CM | POA: Insufficient documentation

## 2019-05-27 DIAGNOSIS — C781 Secondary malignant neoplasm of mediastinum: Secondary | ICD-10-CM | POA: Insufficient documentation

## 2019-05-27 DIAGNOSIS — Z79899 Other long term (current) drug therapy: Secondary | ICD-10-CM | POA: Insufficient documentation

## 2019-05-27 DIAGNOSIS — C3491 Malignant neoplasm of unspecified part of right bronchus or lung: Secondary | ICD-10-CM

## 2019-05-27 DIAGNOSIS — C3431 Malignant neoplasm of lower lobe, right bronchus or lung: Secondary | ICD-10-CM | POA: Insufficient documentation

## 2019-05-27 DIAGNOSIS — C3490 Malignant neoplasm of unspecified part of unspecified bronchus or lung: Secondary | ICD-10-CM

## 2019-05-27 DIAGNOSIS — I4891 Unspecified atrial fibrillation: Secondary | ICD-10-CM | POA: Insufficient documentation

## 2019-05-27 DIAGNOSIS — Z7951 Long term (current) use of inhaled steroids: Secondary | ICD-10-CM | POA: Insufficient documentation

## 2019-05-27 DIAGNOSIS — D701 Agranulocytosis secondary to cancer chemotherapy: Secondary | ICD-10-CM | POA: Insufficient documentation

## 2019-05-27 DIAGNOSIS — K760 Fatty (change of) liver, not elsewhere classified: Secondary | ICD-10-CM | POA: Insufficient documentation

## 2019-05-27 DIAGNOSIS — J449 Chronic obstructive pulmonary disease, unspecified: Secondary | ICD-10-CM | POA: Insufficient documentation

## 2019-05-27 DIAGNOSIS — Z7901 Long term (current) use of anticoagulants: Secondary | ICD-10-CM | POA: Insufficient documentation

## 2019-05-27 DIAGNOSIS — Z9884 Bariatric surgery status: Secondary | ICD-10-CM | POA: Insufficient documentation

## 2019-05-27 DIAGNOSIS — R946 Abnormal results of thyroid function studies: Secondary | ICD-10-CM

## 2019-05-27 DIAGNOSIS — Z6841 Body Mass Index (BMI) 40.0 and over, adult: Secondary | ICD-10-CM | POA: Insufficient documentation

## 2019-05-27 LAB — COMPREHENSIVE METABOLIC PANEL, NON-FASTING
ALBUMIN: 3.7 g/dL (ref 3.2–4.6)
ALKALINE PHOSPHATASE: 71 U/L (ref 20–130)
ALT (SGPT): 16 U/L (ref <=52)
ANION GAP: 6 mmol/L
AST (SGOT): 25 U/L (ref <=35)
BILIRUBIN TOTAL: 0.5 mg/dL (ref 0.3–1.2)
BUN/CREA RATIO: 18
BUN: 14 mg/dL (ref 10–25)
CALCIUM: 8.6 mg/dL — ABNORMAL LOW (ref 8.8–10.3)
CHLORIDE: 106 mmol/L (ref 98–111)
CO2 TOTAL: 25 mmol/L (ref 21–35)
CREATININE: 0.79 mg/dL (ref <=1.30)
ESTIMATED GFR: >60 mL/min/{1.73_m2}
GLUCOSE: 106 mg/dL (ref 70–110)
POTASSIUM: 4.2 mmol/L (ref 3.5–5.0)
PROTEIN TOTAL: 5.5 g/dL — ABNORMAL LOW (ref 6.0–8.3)
SODIUM: 137 mmol/L (ref 135–145)

## 2019-05-27 LAB — CBC WITH DIFF
BASOPHIL #: 0 10*3/uL (ref 0.00–0.20)
BASOPHIL %: 1 %
EOSINOPHIL #: 0.1 10*3/uL (ref 0.00–0.50)
EOSINOPHIL %: 2 %
HCT: 35.6 % (ref 34.6–46.2)
HGB: 11.7 g/dL — ABNORMAL LOW (ref 11.8–15.8)
LYMPHOCYTE #: 0.6 10*3/uL — ABNORMAL LOW (ref 0.90–3.40)
LYMPHOCYTE %: 12 %
MCH: 24.8 pg — ABNORMAL LOW (ref 27.6–33.2)
MCHC: 33 g/dL (ref 32.6–35.4)
MCV: 75.2 fL — ABNORMAL LOW (ref 82.3–96.7)
MONOCYTE #: 0.8 10*3/uL (ref 0.20–0.90)
MONOCYTE %: 14 %
MPV: 7.4 fL (ref 6.6–10.2)
NEUTROPHIL #: 4 10*3/uL (ref 1.50–6.40)
NEUTROPHIL %: 72 %
PLATELETS: 221 10*3/uL (ref 140–440)
RBC: 4.73 10*6/uL (ref 3.80–5.24)
RDW: 17.9 % — ABNORMAL HIGH (ref 12.4–15.2)
WBC: 5.5 10*3/uL (ref 3.5–10.3)

## 2019-05-27 LAB — THYROID STIMULATING HORMONE (SENSITIVE TSH): TSH: 26.671 u[IU]/mL — ABNORMAL HIGH (ref 0.450–5.330)

## 2019-05-27 MED ORDER — SODIUM CHLORIDE 0.9 % INTRAVENOUS SOLUTION
10.0000 mg/kg | Freq: Once | INTRAVENOUS | Status: AC
Start: 2019-05-27 — End: 2019-05-27
  Administered 2019-05-27: 1250 mg via INTRAVENOUS
  Administered 2019-05-27: 0 mg via INTRAVENOUS
  Filled 2019-05-27: qty 25

## 2019-05-27 NOTE — Nurses Notes (Addendum)
0846:  Right chest port accessed with 19 gauge 0.75" [place] under sterile procedure. Positive blood return noted. Lab specimens drawn. Flushed with 10 mL NS. Dressing applied and intact L. [place], Therapist, sports. Patient discharged ambulatory without any complaints to waiting room. Quintin Alto, LPN

## 2019-05-27 NOTE — Nurses Notes (Addendum)
9396- Pt checked in for Imfinzi infusion. BP (!) 156/90   Pulse 65   Temp 37.2 C (98.9 F)   Resp 18   SpO2 96% Shawnie Pons, RN  1000- Still waiting for Imfinzi to arrive from pharmacy. Spoke with pharmacy, they are having issues with their printer and are working on the [place] now. Shawnie Pons, RN  [place] flushed with 14ml NS, +BR noted. Imfinzi infusion began for 60 minute infusion. Shawnie Pons, RN  [phone]- Imfinzi infusion completed with no issues. Port flushed with 53ml NS, +BR noted, Then flushed with 81ml Heparin. [place], Band-Aid intact. Pt left infusion via wheelchair she came in and is aware of next appointment. Shawnie Pons, RN

## 2019-05-27 NOTE — Progress Notes (Signed)
[place]  [place] [id number]        Encounter Date: 05/27/2019   9:15 AM EDT    Name:  Kelsey [name]  Age: 68 y.o.  DOB: [birth year]  Sex: female  PCP: [place]    Chief Complaint:    Chief Complaint   Patient presents with   . Lung Cancer     HISTORY OF PRESENT ILLNESS:   68 y.o. female with history of stage III colon cancer of present evaluation and management of lung cancer.    1. September 2015. Patient underwent sigmoidoscopy and was found to have polyp which was adenocarcinoma.    2. June 10, 2014. She was admitted for segmental resection of sigmoid colon. This revealed pT3, N1a, MX, low-grade  adenocarcinoma of the colon. The mass measuring 4 x 3 x 0.5 centimeters. It  was low-grade and there was no evidence of microsatellite instability by  histology. Margins were uninvolved and 4 lymph nodes only were removed which  only 1 was involved. This corresponding stage IIIB, T3, N1a, M0, low-grade  sigmoid cancer.     3. July 07, 2014. Recommended adjuvant chemotherapy with FOLFOX.    4. July 27, 2014. Patient was started on adjuvant chemotherapy with FOLFOX.  Patient received only 5 treatments and chemotherapy was stopped due to poor tolerance.  After that patient lost follow-up.      5. October 2019. Patient was admitted to hospital with concern for  bowel obstruction.  CT chest showed lung nodules.  Patient was managed conservatively and discharged home.    6. May 06, 2018.  Patient underwent CT-guided biopsy of lung lesion.  Pathology showed well-differentiated adenocarcinoma.  Section shows well-differentiated adenocarcinoma which positive for CK 7 and TTF 1, negative for P 63 and CK 20.     7. June 23, 2018. PET scan showed Hypermetabolic right lower lobe pulmonary mass compatible with malignancy.  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 Hepatomegaly and  steatosis.     8. July 23, 2018. Patient was started on chemoradiation.    9. October 30, 2018. PET scan showed Hypermetabolic right lower lobe mass with abnormal mediastinal and hilar nodes. There has been only mild improvement since earlier exam.    10. October 31, 2018. Patient was started on maintenance immunotherapy with durvalumab.    11. February 17, 2019. CT chest revealed Moderate right effusion new from 12/19/2018. No change in the right lower lung mass or a presumed postradiation changes in the right upper lung when compared to 12/19/2018. Mass in the right cardiophrenic angle increased from 10/30/2019 6). Consider an enlarged lymph node here. This is unchanged from 12/19/2018 but increased since size from 10/30/2018. Another possibility is an enlarging pericardial cyst (this region was not hypermetabolic 1/94/1740). Mild enlargement of the left adrenal gland stable from 10/30/2018. Approximate 9 cm upper abdominal ventral hernia containing nonobstructed bowel loops unchanged    12. March 06, 2019. PET CT revealed 1. The RIGHT lower lobe mass has decreased in size and uptake, which is consistent with response to therapy. However, abnormal uptake remains. This is consistent with residual tumor. Suspect post radiation change in the RIGHT pneumothorax. Consolidation in the medial  RIGHT lung has tumor level uptake. Differential: Post radiation change, infection, and tumor infiltration. Additional findings are concerning for tumor infiltration. Follow-up recommended. Increased RIGHT hilar density and uptake. Concerning for tumor spread. Post radiation change less likely. 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 A low-attenuation mass in the RIGHT cardiophrenic angle may be related. Lymph node metastasis less likely.      SUBJECTIVE:  Patient presents for follow-up and treatment.  Denies any new issues since last visit.  Denies any worsening shortness of breath or chest pain.  Denies any fevers or chills.  Tolerating  treatment well.      REVIEW OF SYSTEMS:  GENERAL:   Denies fever or chills  HEENT: no sore throat, congestion, blurry vision  Lungs:  Denies any worsening shortness of breath or cough.  GI: no nausea, vomiting, constipation, no diarrhea  GU: No dysuria, urgency, increased frequency   Cardiac:  Denies any chest pain, palpitations, syncope,   Neuro: no weakness, loss of function, numbness, tingling  Skin: no rash  Musculoskeletal: No myalgias  Psychosocial: No depression, anxiety  Hematologic: No easy bruising, abnormal bleeding  All other review ROS negative except those mentioned above.     PATIENT HISTORY:  Past Medical History:   Diagnosis Date   . A-fib (CMS HCC)    . Arthropathy, unspecified, site unspecified    . Asthma    . Cancer (CMS HCC)     cervical   . Cataract    . Colon cancer (CMS [facility]) 07/06/2014   . COPD (chronic obstructive pulmonary disease) (CMS HCC)    . Fistula    . HTN (hypertension)    . Hyperlipidemia    . Lung mass     with pleural effusion   . Obesity    . Sleep apnea    . Ventral hernia        Past Surgical History:   Procedure Laterality Date   . ABSCESS DRAINAGE     . BOWEL RESECTION     . CATARACT EXTRACTION     . HX CERVICAL CONE BIOPSY      . HX CESAREAN SECTION     . HX GASTRIC BYPASS      25 years ago   . [place] (x2)   . HX HYSTERECTOMY      late 1990s   . HX LAP BANDING      2013   . HX LAPAROTOMY      2013 to remove lap band       Family Medical History:     Problem Relation (Age of Onset)    Diabetes Mother, Maternal Grandmother, Maternal Grandfather, Father    Heart Attack Mother    Stroke Father          Current Outpatient Medications   Medication Sig   . albuterol sulfate (PROVENTIL OR VENTOLIN OR PROAIR) 90 mcg/actuation Inhalation HFA Aerosol Inhaler INHALE 1 TO 2 PUFFS BY MOUTH EVERY 6 HOURS AS NEEDED   . albuterol sulfate (PROVENTIL) 2.5 mg /3 mL (0.083 %) Inhalation Solution for Nebulization 3 mL (2.5 mg total) by Nebulization route Every 6 hours     . apixaban (ELIQUIS) 5 mg Oral Tablet Take 1 Tab (5 mg total) by mouth Twice daily   . atorvastatin (LIPITOR) 10 mg Oral Tablet Take 10 mg by mouth Once a day   . budesonide-formoteroL (SYMBICORT) 160-4.5 mcg/actuation Inhalation HFA Aerosol Inhaler Take 2 Puffs by inhalation Twice  daily   . carvediloL (COREG) 3.125 mg Oral Tablet Take 3.125 mg by mouth Twice daily with food   . docusate sodium (COLACE) 100 mg Oral Capsule Take 100 mg by mouth Three times a day as needed    . flecainide (TAMBOCOR) 100 mg Oral Tablet Take 100 mg by mouth Twice daily   . fluticasone propionate (FLONASE) 50 mcg/actuation Nasal Spray, Suspension 1 Spray by Each Nostril route Once a day   . levothyroxine (SYNTHROID) 25 mcg Oral Tablet Take 1 Tab (25 mcg total) by mouth Every morning   . linaCLOtide (LINZESS) 72 mcg Oral Capsule Take 72 mcg by mouth Every morning   . lisinopriL (PRINIVIL) 20 mg Oral Tablet Take 20 mg by mouth Once a day   . loperamide (IMODIUM) 2 mg Oral Capsule Take 2 mg by mouth Every 4 hours as needed   . loratadine (CLARITIN) 10 mg Oral Tablet Take 1 Tab (10 mg total) by mouth Once a day   . magnesium oxide (MAG-OX) 400 mg Oral Tablet Take 400 mg by mouth Once a day   . omeprazole (PRILOSEC) 20 mg Oral Capsule, Delayed Release(E.C.) Take 40 mg by mouth Once a day    . oxyCODONE (ROXICODONE) 5 mg Oral Tablet Take 1 Tab (5 mg total) by mouth Every 6 hours as needed for Pain   . potassium chloride (K-DUR) 20 mEq Oral Tab Sust.Rel. Particle/Crystal Take 20 mEq by mouth Once a day Takes 3 days a week   . prochlorperazine (COMPAZINE) 10 mg Oral Tablet Take 1 Tab (10 mg total) by mouth Four times a day as needed for Nausea/Vomiting   . rOPINIRole (REQUIP) 0.5 mg Oral Tablet Take 0.5 mg by mouth Every night     Social History     Socioeconomic History   . Marital status: Married     Spouse name: Not on file   . Number of children: Not on file   . Years of education: Not on file   . Highest education level: Not on file    Tobacco Use   . Smoking status: Former Research scientist (life sciences)   . Smokeless tobacco: Never Used   . Tobacco comment: quit 10 yrs ago   Substance and Sexual Activity   . Alcohol use: Not Currently     Comment: rarely   . Drug use: No   Other Topics Concern   . Ability to Walk 1 Flight of Steps without SOB/CP No   . Ability To Do Own ADL's Yes       PHYSICAL EXAMINATION:  General Vitals: BP (!) 156/90   Pulse 65   Temp 37.2 C (98.9 F)   Ht 1.575 m (_0 )   Wt 122 kg (270 lb)   SpO2 96%   BMI 49.38 kg/m       PHYSICAL EXAM:   Consitutional: Appears fatigue  Eyes: EOMI. No discharge. No Jaundice.   ENT: mucous membranes dry. No posterior pharynx lesions.. Neck supple, No palpable masses   Heme/ Lymph: No Cervical, Inguinal, axillary lymh nodes. No Bruising.   Cardiovascular: S1, S2, No murmurs, rubs, or gallops.  Respiratory: clear B/L   Abdomen: Normal Bowel Sounds, nontender nondistended. No hepatosplenomegaly.   Musculoskeletal: No Edema to the extremities.   Skin: Normal turgor. No Rashes,skin lesions   Psychiatry: Normal Affect   Neuro: No focal deficits. Alert and Oriented x 3      ENCOUNTER ORDERS:  No orders of the defined types were placed in this encounter.  LABORATORY/RADIOLOGICAL DATA: All pertinent labs/radiology were reviewed.   PET CT 03/06/19      IMPRESSION:  1. The RIGHT lower lobe mass has decreased in size and uptake, which is  consistent with response to therapy. However, abnormal uptake remains. This  is consistent with residual tumor.  2. Suspect post radiation change in the RIGHT pneumothorax. Consolidation  in the medial RIGHT lung has tumor level uptake. Differential: Post  radiation change, infection, and tumor infiltration. Additional findings  are concerning for tumor infiltration. Follow-up recommended.  3. Increased RIGHT hilar density and uptake. Concerning for tumor spread.  Post radiation change less likely.  4. 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 A low-attenuation mass in  the  RIGHT cardiophrenic angle may be related. Lymph node metastasis less  Likely.      ASSESSMENT AND PLAN:  68 y.o. female with history of stage III colon cancer status post 5 cycle of FOLFOX now presenting with stage III lung cancer.      ICD-10-CM    1. Malignant neoplasm of lung, unspecified laterality, unspecified part of lung (CMS HCC)  C34.90    2. Encounter for antineoplastic chemotherapy  Z51.11    3. Cancer related pain  G89.3    4. Hypothyroidism, unspecified type  E03.9        1. Adenocarcinoma lung:  Stage III.    CT brain to rule out metastatic disease.  NEGATIVE.   Started on chemoradiation carboplatin/Taxol along with radiation- July 23, 2018.  After completion of chemo radiation patient will get PET scan.  If continued to have good response will start maintenance immunotherapy for 1 year.   Completed 6/6 cycles of Taxol/Carboplatin and will start maintenance immunotherapy therapy following radiation therapy and PET scan    CHEMOTHERAPY:/RADIATION  CYCLE 13/26 DAY   DRUG DOSE TOTAL DOSE % ROUTE SCHEDULE   Carboplatin AUC- 2 205 mg    COMPLETED   Paclitaxel 45 mg/m2 108 mg    COMPLETED   Durvalumab     Q 2 wks                2. Neutropenia:  Due to chemotherapy.   Resolved.    3. CINV    Zofran PRN    4. Transaminitis: RESOLVED   Instructed to avoid tylenol/alcoholic beverages    5. Thrombocytopenia:  Resolved    6. Dyspnea/fevers: RESOLVED  Better     7. Hypokalemia   Patient on K+ at home   Potassium today 4.2    8. Pain   Patient reports chronic in nature.   Oxycodone every 6 hours     9. Hypothyroidism   Elevated TSH - TSH today 14.8, stable.  Repeat TSH today pending.   Currently on Synthroid.        Return in about 2 weeks (around 06/10/2019) for cbc/diff, CMP, Treatment.      Vanetta Mulders, MD  Hematology/Oncology

## 2019-05-29 NOTE — Progress Notes (Deleted)
Name: Kelsey [name]                       Date of Birth: [birth year]   MRN:  [id number]                         Date of visit: 06/01/2019     PCP: [place] is a 68 y.o. year old female who presents for No chief complaint on file.  to clinic.  She has a history of hypertension, hyperlipidemia, asthma, lung CA and a-fib was diagnosed in 2017.    She is here for three-month follow-up of atrial fibrillation.  She denies chest pain, palpitations, SOB, DOE, orthopnea, syncope or near-syncope.  Patient Active Problem List    Diagnosis Date Noted   . Dyspnea 03/11/2019   . Hx of echocardiogram 01/27/2019     09-25-2018 EF 50-55% ,o;d <R, PA 20-25 mmHg     . Atrial fibrillation with RVR (CMS HCC) 08/29/2018   . Asthma 08/09/2018   . Neutropenic fever (CMS HCC) 08/07/2018   . Malignant neoplasm of lung, unspecified laterality, unspecified part of lung (CMS [place]) 06/26/2018   . Right lower lobe lung mass 03/22/2018   . Colon cancer (CMS [place] [facility]) 07/06/2014   . HTN (hypertension)    . Hyperlipidemia    . Arthropathy, unspecified, site unspecified    . COPD (chronic obstructive pulmonary disease) (CMS HCC)    . Ventral hernia       Current Outpatient Medications   Medication Sig   . albuterol sulfate (PROVENTIL OR VENTOLIN OR PROAIR) 90 mcg/actuation Inhalation HFA Aerosol Inhaler INHALE 1 TO 2 PUFFS BY MOUTH EVERY 6 HOURS AS NEEDED   . albuterol sulfate (PROVENTIL) 2.5 mg /3 mL (0.083 %) Inhalation Solution for Nebulization 3 mL (2.5 mg total) by Nebulization route Every 6 hours   . apixaban (ELIQUIS) 5 mg Oral Tablet Take 1 Tab (5 mg total) by mouth Twice daily   . atorvastatin (LIPITOR) 10 mg Oral Tablet Take 10 mg by mouth Once a day   . budesonide-formoteroL (SYMBICORT) 160-4.5 mcg/actuation Inhalation HFA Aerosol Inhaler Take 2 Puffs by inhalation Twice daily   . carvediloL (COREG) 3.125 mg Oral Tablet Take 3.125 mg by mouth Twice daily with food   . docusate sodium (COLACE) 100  mg Oral Capsule Take 100 mg by mouth Three times a day as needed    . flecainide (TAMBOCOR) 100 mg Oral Tablet Take 100 mg by mouth Twice daily   . fluticasone propionate (FLONASE) 50 mcg/actuation Nasal Spray, Suspension 1 Spray by Each Nostril route Once a day   . levothyroxine (SYNTHROID) 25 mcg Oral Tablet Take 1 Tab (25 mcg total) by mouth Every morning   . linaCLOtide (LINZESS) 72 mcg Oral Capsule Take 72 mcg by mouth Every morning   . lisinopriL (PRINIVIL) 20 mg Oral Tablet Take 20 mg by mouth Once a day   . loperamide (IMODIUM) 2 mg Oral Capsule Take 2 mg by mouth Every 4 hours as needed   . loratadine (CLARITIN) 10 mg Oral Tablet Take 1 Tab (10 mg total) by mouth Once a day   . magnesium oxide (MAG-OX) 400 mg Oral Tablet Take 400 mg by mouth Once a day   . omeprazole (PRILOSEC) 20 mg Oral Capsule, Delayed Release(E.C.) Take 40 mg by mouth Once a day    . oxyCODONE (ROXICODONE) 5  mg Oral Tablet Take 1 Tab (5 mg total) by mouth Every 6 hours as needed for Pain   . potassium chloride (K-DUR) 20 mEq Oral Tab Sust.Rel. Particle/Crystal Take 20 mEq by mouth Once a day Takes 3 days a week   . prochlorperazine (COMPAZINE) 10 mg Oral Tablet Take 1 Tab (10 mg total) by mouth Four times a day as needed for Nausea/Vomiting   . rOPINIRole (REQUIP) 0.5 mg Oral Tablet Take 0.5 mg by mouth Every night      REVIEW OF SYSTEMS:   General: No fever.  No chills.  No weight changes.  HEENT: No vision changes.  Cardiovascular: No chest pain. No palpitations.  No dizziness.  No light-headedness.  No near syncope.  Pulmonary: No dyspnea at rest, no dyspnea on exertion; no cough or hemoptysis; no orthopnea or PND.  GI: No N/V. No melena.  No bright red blood per rectum.  Ext: No edema.  No claudication.  Neuro: No focal weakness.  No numbness.  All other ROS negative.  Objective:   There were no vitals taken for this visit.       Gen: NAD. Alert.   HEENT: PERRL; conjunctiva clear. No JVD or carotid bruit.  Cardiac: RRR with normal  S1, S2.   Respiratory: Clear to auscultation bilaterally. No rales. No wheezing. No rhonchi.  Abdomen: Soft, non-tender.  Extremities: No edema. No cyanosis. No clubbing.  Neurologic:  Grossly intact  Skin: Warm and dry   All other systems are negative  Assessment/Plan  1. Atrial fibrillation with RVR (CMS HCC)    2. Essential hypertension    3. Mixed hyperlipidemia    Plan:  1. Atrial fibrillation rate controlled with Tambocor and anticoagulated with Eliquis.  She denies upper and lower GI bleeding.  2. Blood pressure at goal on current therapy  3. Hyperlipidemia.  Patient is on Lipitor 10 mg daily.  The patient should continue on maximum tolerable statin therapy.  LDL goal ideally <70.  09/26/2018  TC 119  TG 106  HDL 41  LDL 68  4. Call if problems  5. Will see back in 6 months or p.r.n.       No orders of the defined types were placed in this encounter.       Patient seen independently with cosigning physician available for consultation.  Patient seen in conjunction with cosigning physician.  Patient seen independently, case discussed and plan formulated with the cosigning physician.    A portion of this documentation may have been generated using MMODAL voice recognition software and may contain syntax/voice recognition errors.     Leonie Man Harrington Challenger, [place], PA-C  Physician Assistant-Certified  [facility]

## 2019-06-01 ENCOUNTER — Encounter (INDEPENDENT_AMBULATORY_CARE_PROVIDER_SITE_OTHER): Payer: Self-pay | Admitting: Medical

## 2019-06-10 ENCOUNTER — Other Ambulatory Visit (HOSPITAL_COMMUNITY): Payer: Self-pay | Admitting: Internal Medicine

## 2019-06-10 ENCOUNTER — Inpatient Hospital Stay (HOSPITAL_COMMUNITY)
Admission: RE | Admit: 2019-06-10 | Discharge: 2019-06-10 | Disposition: A | Discharge status: Still In Hospital | Payer: Medicare Other | Source: Ambulatory Visit | Attending: Nurse Practitioner | Admitting: Nurse Practitioner

## 2019-06-10 ENCOUNTER — Ambulatory Visit (HOSPITAL_COMMUNITY)
Admission: RE | Admit: 2019-06-10 | Discharge: 2019-06-10 | Disposition: A | Discharge status: Home / Self Care | Payer: Medicare Other | Source: Ambulatory Visit

## 2019-06-10 ENCOUNTER — Encounter (HOSPITAL_COMMUNITY): Payer: Self-pay | Admitting: Internal Medicine

## 2019-06-10 ENCOUNTER — Ambulatory Visit (HOSPITAL_BASED_OUTPATIENT_CLINIC_OR_DEPARTMENT_OTHER): Payer: Medicare Other | Admitting: Internal Medicine

## 2019-06-10 ENCOUNTER — Other Ambulatory Visit: Payer: Self-pay

## 2019-06-10 VITALS — BP 137/75 | HR 70 | Temp 97.8°F | Ht 62.0 in | Wt 270.0 lb

## 2019-06-10 VITALS — BP 136/71 | HR 67 | Temp 98.4°F | Resp 18

## 2019-06-10 DIAGNOSIS — G4730 Sleep apnea, unspecified: Secondary | ICD-10-CM | POA: Insufficient documentation

## 2019-06-10 DIAGNOSIS — E669 Obesity, unspecified: Secondary | ICD-10-CM | POA: Insufficient documentation

## 2019-06-10 DIAGNOSIS — D701 Agranulocytosis secondary to cancer chemotherapy: Secondary | ICD-10-CM | POA: Insufficient documentation

## 2019-06-10 DIAGNOSIS — I4891 Unspecified atrial fibrillation: Secondary | ICD-10-CM | POA: Insufficient documentation

## 2019-06-10 DIAGNOSIS — Z7951 Long term (current) use of inhaled steroids: Secondary | ICD-10-CM | POA: Insufficient documentation

## 2019-06-10 DIAGNOSIS — Z7901 Long term (current) use of anticoagulants: Secondary | ICD-10-CM | POA: Insufficient documentation

## 2019-06-10 DIAGNOSIS — Z8249 Family history of ischemic heart disease and other diseases of the circulatory system: Secondary | ICD-10-CM | POA: Insufficient documentation

## 2019-06-10 DIAGNOSIS — G893 Neoplasm related pain (acute) (chronic): Secondary | ICD-10-CM | POA: Insufficient documentation

## 2019-06-10 DIAGNOSIS — Z9884 Bariatric surgery status: Secondary | ICD-10-CM | POA: Insufficient documentation

## 2019-06-10 DIAGNOSIS — Z5111 Encounter for antineoplastic chemotherapy: Secondary | ICD-10-CM

## 2019-06-10 DIAGNOSIS — E785 Hyperlipidemia, unspecified: Secondary | ICD-10-CM | POA: Insufficient documentation

## 2019-06-10 DIAGNOSIS — Z79899 Other long term (current) drug therapy: Secondary | ICD-10-CM | POA: Insufficient documentation

## 2019-06-10 DIAGNOSIS — T451X5A Adverse effect of antineoplastic and immunosuppressive drugs, initial encounter: Secondary | ICD-10-CM | POA: Insufficient documentation

## 2019-06-10 DIAGNOSIS — E039 Hypothyroidism, unspecified: Secondary | ICD-10-CM | POA: Insufficient documentation

## 2019-06-10 DIAGNOSIS — E876 Hypokalemia: Secondary | ICD-10-CM

## 2019-06-10 DIAGNOSIS — C3490 Malignant neoplasm of unspecified part of unspecified bronchus or lung: Secondary | ICD-10-CM

## 2019-06-10 DIAGNOSIS — J449 Chronic obstructive pulmonary disease, unspecified: Secondary | ICD-10-CM | POA: Insufficient documentation

## 2019-06-10 DIAGNOSIS — R946 Abnormal results of thyroid function studies: Secondary | ICD-10-CM

## 2019-06-10 DIAGNOSIS — Z85038 Personal history of other malignant neoplasm of large intestine: Secondary | ICD-10-CM | POA: Insufficient documentation

## 2019-06-10 DIAGNOSIS — R112 Nausea with vomiting, unspecified: Secondary | ICD-10-CM

## 2019-06-10 DIAGNOSIS — R7989 Other specified abnormal findings of blood chemistry: Secondary | ICD-10-CM

## 2019-06-10 DIAGNOSIS — Z5112 Encounter for antineoplastic immunotherapy: Secondary | ICD-10-CM

## 2019-06-10 DIAGNOSIS — I10 Essential (primary) hypertension: Secondary | ICD-10-CM | POA: Insufficient documentation

## 2019-06-10 DIAGNOSIS — C3431 Malignant neoplasm of lower lobe, right bronchus or lung: Secondary | ICD-10-CM | POA: Insufficient documentation

## 2019-06-10 LAB — COMPREHENSIVE METABOLIC PANEL, NON-FASTING
ALBUMIN: 3.5 g/dL (ref 3.2–4.6)
ALKALINE PHOSPHATASE: 79 U/L (ref 20–130)
ALT (SGPT): 25 U/L (ref <=52)
ANION GAP: 9 mmol/L
AST (SGOT): 32 U/L (ref <=35)
BILIRUBIN TOTAL: 0.6 mg/dL (ref 0.3–1.2)
BUN/CREA RATIO: 14
BUN: 11 mg/dL (ref 10–25)
CALCIUM: 8.3 mg/dL — ABNORMAL LOW (ref 8.8–10.3)
CHLORIDE: 105 mmol/L (ref 98–111)
CO2 TOTAL: 25 mmol/L (ref 21–35)
CREATININE: 0.81 mg/dL (ref <=1.30)
ESTIMATED GFR: >60 mL/min/{1.73_m2}
GLUCOSE: 125 mg/dL — ABNORMAL HIGH (ref 70–110)
POTASSIUM: 3.7 mmol/L (ref 3.5–5.0)
PROTEIN TOTAL: 5.7 g/dL — ABNORMAL LOW (ref 6.0–8.3)
SODIUM: 139 mmol/L (ref 135–145)

## 2019-06-10 LAB — CBC WITH DIFF
BASOPHIL #: 0 10*3/uL (ref 0.00–0.20)
BASOPHIL %: 1 %
EOSINOPHIL #: 0.1 10*3/uL (ref 0.00–0.50)
EOSINOPHIL %: 2 %
HCT: 35 % (ref 34.6–46.2)
HGB: 11.4 g/dL — ABNORMAL LOW (ref 11.8–15.8)
LYMPHOCYTE #: 0.7 10*3/uL — ABNORMAL LOW (ref 0.90–3.40)
LYMPHOCYTE %: 15 %
MCH: 24.8 pg — ABNORMAL LOW (ref 27.6–33.2)
MCHC: 32.6 g/dL (ref 32.6–35.4)
MCV: 76.1 fL — ABNORMAL LOW (ref 82.3–96.7)
MONOCYTE #: 0.6 10*3/uL (ref 0.20–0.90)
MONOCYTE %: 13 %
MPV: 7.5 fL (ref 6.6–10.2)
NEUTROPHIL #: 3 10*3/uL (ref 1.50–6.40)
NEUTROPHIL %: 69 %
PLATELETS: 219 10*3/uL (ref 140–440)
RBC: 4.6 10*6/uL (ref 3.80–5.24)
RDW: 18.1 % — ABNORMAL HIGH (ref 12.4–15.2)
WBC: 4.4 10*3/uL (ref 3.5–10.3)

## 2019-06-10 LAB — THYROID STIMULATING HORMONE (SENSITIVE TSH): TSH: 40.804 u[IU]/mL — ABNORMAL HIGH (ref 0.450–5.330)

## 2019-06-10 MED ORDER — SODIUM CHLORIDE 0.9 % INTRAVENOUS SOLUTION
10.0000 mg/kg | Freq: Once | INTRAVENOUS | Status: AC
Start: 2019-06-10 — End: 2019-06-10
  Administered 2019-06-10: 11:00:00
  Administered 2019-06-10: 1250 mg via INTRAVENOUS
  Filled 2019-06-10: qty 25

## 2019-06-10 MED ORDER — LEVOTHYROXINE 50 MCG TABLET
50.00 ug | ORAL_TABLET | Freq: Every morning | ORAL | 0 refills | Status: DC
Start: 2019-06-10 — End: 2019-11-26

## 2019-06-10 MED ORDER — OXYCODONE 5 MG TABLET
5.00 mg | ORAL_TABLET | Freq: Four times a day (QID) | ORAL | 0 refills | Status: DC | PRN
Start: 2019-06-10 — End: 2019-06-24

## 2019-06-10 NOTE — Nurses Notes (Addendum)
0258 - Waiting on results of CMP/MD signature prior to release.Karl Luke, RN  [phone] - R chest port flushed with 10 ml NS, + BR. Imfinzi started to infuse over a one hour rate.Karl Luke, RN  [phone] - Infusion complete. Port flushed with 10 ml NS/5 ml Heparin flush, + BR. Site deaccessed and band aid applied. Tolerated infusion well and discharged ambulatory.Karl Luke, RN

## 2019-06-10 NOTE — Progress Notes (Signed)
[place]  [place] [id number]        Encounter Date: 06/10/2019   9:15 AM EDT    Name:  Kelsey [name]  Age: 68 y.o.  DOB: 09/08/1950  Sex: female  PCP: Benjie Karvonen    Chief Complaint:    No chief complaint on file.    HISTORY OF PRESENT ILLNESS:   68 y.o. female with history of stage III colon cancer of present evaluation and management of lung cancer.    1. September 2015. Patient underwent sigmoidoscopy and was found to have polyp which was adenocarcinoma.    2. June 10, 2014. She was admitted for segmental resection of sigmoid colon. This revealed pT3, N1a, MX, low-grade  adenocarcinoma of the colon. The mass measuring 4 x 3 x 0.5 centimeters. It  was low-grade and there was no evidence of microsatellite instability by  histology. Margins were uninvolved and 4 lymph nodes only were removed which  only 1 was involved. This corresponding stage IIIB, T3, N1a, M0, low-grade  sigmoid cancer.     3. July 07, 2014. Recommended adjuvant chemotherapy with FOLFOX.    4. July 27, 2014. Patient was started on adjuvant chemotherapy with FOLFOX.  Patient received only 5 treatments and chemotherapy was stopped due to poor tolerance.  After that patient lost follow-up.      5. October 2019. Patient was admitted to hospital with concern for  bowel obstruction.  CT chest showed lung nodules.  Patient was managed conservatively and discharged home.    6. May 06, 2018.  Patient underwent CT-guided biopsy of lung lesion.  Pathology showed well-differentiated adenocarcinoma.  Section shows well-differentiated adenocarcinoma which positive for CK 7 and TTF 1, negative for P 63 and CK 20.     7. June 23, 2018. PET scan showed Hypermetabolic right lower lobe pulmonary mass compatible with malignancy.  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 Hepatomegaly and steatosis.     8. July 23, 2018. Patient was started on chemoradiation.    9. October 30, 2018. PET scan showed Hypermetabolic right lower lobe mass with abnormal mediastinal and hilar nodes. There has been only mild improvement since earlier exam.    10. October 31, 2018. Patient was started on maintenance immunotherapy with durvalumab.    11. February 17, 2019. CT chest revealed Moderate right effusion new from 12/19/2018. No change in the right lower lung mass or a presumed postradiation changes in the right upper lung when compared to 12/19/2018. Mass in the right cardiophrenic angle increased from 10/30/2019 6). Consider an enlarged lymph node here. This is unchanged from 12/19/2018 but increased since size from 10/30/2018. Another possibility is an enlarging pericardial cyst (this region was not hypermetabolic 1/88/4166). Mild enlargement of the left adrenal gland stable from 10/30/2018. Approximate 9 cm upper abdominal ventral hernia containing nonobstructed bowel loops unchanged    12. March 06, 2019. PET CT revealed 1. The RIGHT lower lobe mass has decreased in size and uptake, which is consistent with response to therapy. However, abnormal uptake remains. This is consistent with residual tumor. Suspect post radiation change in the RIGHT pneumothorax. Consolidation in the medial RIGHT lung has tumor level uptake. Differential: Post  radiation change, infection, and tumor infiltration. Additional findings are concerning for tumor infiltration. Follow-up recommended. Increased RIGHT hilar density and uptake. Concerning for tumor spread. Post radiation change less likely. 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 A low-attenuation mass in the RIGHT cardiophrenic angle may be related. Lymph node metastasis less likely.      SUBJECTIVE:  Patient presents for follow-up and treatment.  Denies any new issues since last visit.  Denies any worsening shortness of breath or chest pain.  Denies any fevers or chills.  Tolerating treatment well.  Denies any  headache, blurry vision or dizziness.  No nausea or vomiting.      REVIEW OF SYSTEMS:  GENERAL:   Denies fever or chills  HEENT: no sore throat, congestion, blurry vision  Lungs:  Denies any worsening shortness of breath or cough.  GI: no nausea, vomiting, constipation, no diarrhea  GU: No dysuria, urgency, increased frequency   Cardiac:  Denies any chest pain, palpitations, syncope,   Neuro: no weakness, loss of function, numbness, tingling  Skin: no rash  Musculoskeletal: No myalgias  Psychosocial: No depression, anxiety  Hematologic: No easy bruising, abnormal bleeding  All other review ROS negative except those mentioned above.     PATIENT HISTORY:  Past Medical History:   Diagnosis Date   . A-fib (CMS HCC)    . Arthropathy, unspecified, site unspecified    . Asthma    . Cancer (CMS HCC)     cervical   . Cataract    . Colon cancer (CMS [facility]) 07/06/2014   . COPD (chronic obstructive pulmonary disease) (CMS HCC)    . Fistula    . HTN (hypertension)    . Hyperlipidemia    . Lung mass     with pleural effusion   . Obesity    . Sleep apnea    . Ventral hernia        Past Surgical History:   Procedure Laterality Date   . ABSCESS DRAINAGE     . BOWEL RESECTION     . CATARACT EXTRACTION     . HX CERVICAL CONE BIOPSY      . HX CESAREAN SECTION     . HX GASTRIC BYPASS      25 years ago   . [facility] (x2)   . HX HYSTERECTOMY      late 1990s   . HX LAP BANDING      2013   . HX LAPAROTOMY      2013 to remove lap band       Family Medical History:     Problem Relation (Age of Onset)    Diabetes Mother, Maternal Grandmother, Maternal Grandfather, Father    Heart Attack Mother    Stroke Father          Current Outpatient Medications   Medication Sig   . albuterol sulfate (PROVENTIL OR VENTOLIN OR PROAIR) 90 mcg/actuation Inhalation HFA Aerosol Inhaler INHALE 1 TO 2 PUFFS BY MOUTH EVERY 6 HOURS AS NEEDED   . albuterol sulfate (PROVENTIL) 2.5 mg /3 mL (0.083 %) Inhalation Solution for Nebulization 3 mL (2.5  mg total) by Nebulization route Every 6 hours   . apixaban (ELIQUIS) 5 mg Oral Tablet Take 1 Tab (5 mg total) by mouth Twice daily   . atorvastatin (LIPITOR) 10 mg Oral Tablet Take 10 mg by mouth Once a day   . budesonide-formoteroL (SYMBICORT) 160-4.5 mcg/actuation Inhalation HFA Aerosol Inhaler Take 2  Puffs by inhalation Twice daily   . carvediloL (COREG) 3.125 mg Oral Tablet Take 3.125 mg by mouth Twice daily with food   . docusate sodium (COLACE) 100 mg Oral Capsule Take 100 mg by mouth Three times a day as needed    . flecainide (TAMBOCOR) 100 mg Oral Tablet Take 100 mg by mouth Twice daily   . fluticasone propionate (FLONASE) 50 mcg/actuation Nasal Spray, Suspension 1 Spray by Each Nostril route Once a day   . levothyroxine (SYNTHROID) 25 mcg Oral Tablet Take 1 Tab (25 mcg total) by mouth Every morning   . linaCLOtide (LINZESS) 72 mcg Oral Capsule Take 72 mcg by mouth Every morning   . lisinopriL (PRINIVIL) 20 mg Oral Tablet Take 20 mg by mouth Once a day   . loperamide (IMODIUM) 2 mg Oral Capsule Take 2 mg by mouth Every 4 hours as needed   . loratadine (CLARITIN) 10 mg Oral Tablet Take 1 Tab (10 mg total) by mouth Once a day   . magnesium oxide (MAG-OX) 400 mg Oral Tablet Take 400 mg by mouth Once a day   . omeprazole (PRILOSEC) 20 mg Oral Capsule, Delayed Release(E.C.) Take 40 mg by mouth Once a day    . oxyCODONE (ROXICODONE) 5 mg Oral Tablet Take 1 Tab (5 mg total) by mouth Every 6 hours as needed for Pain   . potassium chloride (K-DUR) 20 mEq Oral Tab Sust.Rel. Particle/Crystal Take 20 mEq by mouth Once a day Takes 3 days a week   . prochlorperazine (COMPAZINE) 10 mg Oral Tablet Take 1 Tab (10 mg total) by mouth Four times a day as needed for Nausea/Vomiting   . rOPINIRole (REQUIP) 0.5 mg Oral Tablet Take 0.5 mg by mouth Every night     Social History     Socioeconomic History   . Marital status: Married     Spouse name: Not on file   . Number of children: Not on file   . Years of education: Not on file     . Highest education level: Not on file   Tobacco Use   . Smoking status: Former Research scientist (life sciences)   . Smokeless tobacco: Never Used   . Tobacco comment: quit 10 yrs ago   Substance and Sexual Activity   . Alcohol use: Not Currently     Comment: rarely   . Drug use: No   Other Topics Concern   . Ability to Walk 1 Flight of Steps without SOB/CP No   . Ability To Do Own ADL's Yes       PHYSICAL EXAMINATION:  General Vitals: BP 137/75   Pulse 70   Temp 36.6 C (97.8 F)   Ht 1.575 m (5' 2" )   Wt 122 kg (270 lb)   SpO2 95%   BMI 49.38 kg/m       PHYSICAL EXAM:   Consitutional: Appears fatigue  Eyes: EOMI. No discharge. No Jaundice.   ENT: mucous membranes dry. No posterior pharynx lesions.. Neck supple, No palpable masses   Heme/ Lymph: No Cervical, Inguinal, axillary lymh nodes. No Bruising.   Cardiovascular: S1, S2, No murmurs, rubs, or gallops.  Respiratory: clear B/L   Abdomen: Normal Bowel Sounds, nontender nondistended. No hepatosplenomegaly.   Musculoskeletal: No Edema to the extremities.   Skin: Normal turgor. No Rashes,skin lesions   Psychiatry: Normal Affect   Neuro: No focal deficits. Alert and Oriented x 3      ENCOUNTER ORDERS:  No orders of the defined types were  placed in this encounter.      LABORATORY/RADIOLOGICAL DATA: All pertinent labs/radiology were reviewed.   PET CT 03/06/19      IMPRESSION:  1. The RIGHT lower lobe mass has decreased in size and uptake, which is  consistent with response to therapy. However, abnormal uptake remains. This  is consistent with residual tumor.  2. Suspect post radiation change in the RIGHT pneumothorax. Consolidation  in the medial RIGHT lung has tumor level uptake. Differential: Post  radiation change, infection, and tumor infiltration. Additional findings  are concerning for tumor infiltration. Follow-up recommended.  3. Increased RIGHT hilar density and uptake. Concerning for tumor spread.  Post radiation change less likely.  4. Likely malignant RIGHT pleural  effusion. A low-attenuation mass in the  RIGHT cardiophrenic angle may be related. Lymph node metastasis less  Likely.      ASSESSMENT AND PLAN:  68 y.o. female with history of stage III colon cancer status post 5 cycle of FOLFOX now presenting with stage III lung cancer.      ICD-10-CM    1. Malignant neoplasm of lung, unspecified laterality, unspecified part of lung (CMS HCC)  C34.90    2. Encounter for antineoplastic chemotherapy  Z51.11    3. Cancer related pain  G89.3        1. Adenocarcinoma lung:  Stage III.    CT brain to rule out metastatic disease.  NEGATIVE.   Started on chemoradiation carboplatin/Taxol along with radiation- July 23, 2018.  After completion of chemo radiation patient will get PET scan.  If continued to have good response will start maintenance immunotherapy for 1 year.   Completed 6/6 cycles of Taxol/Carboplatin and will start maintenance immunotherapy therapy following radiation therapy and PET scan    CHEMOTHERAPY:/RADIATION  CYCLE 14/26 DAY   DRUG DOSE TOTAL DOSE % ROUTE SCHEDULE   Carboplatin AUC- 2 205 mg    COMPLETED   Paclitaxel 45 mg/m2 108 mg    COMPLETED   Durvalumab     Q 2 wks                2. Neutropenia:  Due to chemotherapy.   Resolved.    3. CINV    Zofran PRN    4. Transaminitis: RESOLVED   Instructed to avoid tylenol/alcoholic beverages    5. Thrombocytopenia:  Resolved    6. Dyspnea/fevers: RESOLVED  Better     7. Hypokalemia   Patient on K+ at home   Potassium today 3.7    8. Pain   Patient reports chronic in nature.   Oxycodone every 6 hours     9. Hypothyroidism   Elevated TSH - TSH today 40.8.   Will adjust thyroid medication.        Return in about 2 weeks (around 06/24/2019) for cbc/diff, CMP, Treatment.      Vanetta Mulders, MD  Hematology/Oncology

## 2019-06-24 ENCOUNTER — Encounter (HOSPITAL_COMMUNITY): Payer: Self-pay | Admitting: Internal Medicine

## 2019-06-24 ENCOUNTER — Ambulatory Visit (HOSPITAL_BASED_OUTPATIENT_CLINIC_OR_DEPARTMENT_OTHER): Payer: Medicare Other | Admitting: Internal Medicine

## 2019-06-24 ENCOUNTER — Ambulatory Visit (HOSPITAL_COMMUNITY)
Admission: RE | Admit: 2019-06-24 | Discharge: 2019-06-24 | Disposition: A | Discharge status: Home / Self Care | Payer: Medicare Other | Source: Ambulatory Visit

## 2019-06-24 ENCOUNTER — Encounter (HOSPITAL_COMMUNITY): Payer: Self-pay

## 2019-06-24 ENCOUNTER — Inpatient Hospital Stay
Admission: RE | Admit: 2019-06-24 | Discharge: 2019-06-24 | Disposition: A | Discharge status: Still In Hospital | Payer: Medicare Other | Source: Ambulatory Visit | Attending: Internal Medicine | Admitting: Internal Medicine

## 2019-06-24 ENCOUNTER — Other Ambulatory Visit: Payer: Self-pay

## 2019-06-24 VITALS — BP 144/73 | HR 71 | Temp 97.9°F | Resp 18 | Ht 62.0 in | Wt 270.0 lb

## 2019-06-24 VITALS — BP 144/73 | HR 71 | Temp 97.8°F | Ht 62.0 in | Wt 270.0 lb

## 2019-06-24 DIAGNOSIS — E669 Obesity, unspecified: Secondary | ICD-10-CM | POA: Insufficient documentation

## 2019-06-24 DIAGNOSIS — C3490 Malignant neoplasm of unspecified part of unspecified bronchus or lung: Secondary | ICD-10-CM

## 2019-06-24 DIAGNOSIS — Z5111 Encounter for antineoplastic chemotherapy: Secondary | ICD-10-CM

## 2019-06-24 DIAGNOSIS — G893 Neoplasm related pain (acute) (chronic): Secondary | ICD-10-CM

## 2019-06-24 DIAGNOSIS — Z7901 Long term (current) use of anticoagulants: Secondary | ICD-10-CM | POA: Insufficient documentation

## 2019-06-24 DIAGNOSIS — E876 Hypokalemia: Secondary | ICD-10-CM | POA: Insufficient documentation

## 2019-06-24 DIAGNOSIS — J449 Chronic obstructive pulmonary disease, unspecified: Secondary | ICD-10-CM | POA: Insufficient documentation

## 2019-06-24 DIAGNOSIS — E039 Hypothyroidism, unspecified: Secondary | ICD-10-CM | POA: Insufficient documentation

## 2019-06-24 DIAGNOSIS — I10 Essential (primary) hypertension: Secondary | ICD-10-CM | POA: Insufficient documentation

## 2019-06-24 DIAGNOSIS — Z85038 Personal history of other malignant neoplasm of large intestine: Secondary | ICD-10-CM | POA: Insufficient documentation

## 2019-06-24 DIAGNOSIS — Z9884 Bariatric surgery status: Secondary | ICD-10-CM | POA: Insufficient documentation

## 2019-06-24 DIAGNOSIS — T451X5A Adverse effect of antineoplastic and immunosuppressive drugs, initial encounter: Secondary | ICD-10-CM | POA: Insufficient documentation

## 2019-06-24 DIAGNOSIS — Z79899 Other long term (current) drug therapy: Secondary | ICD-10-CM | POA: Insufficient documentation

## 2019-06-24 DIAGNOSIS — G4730 Sleep apnea, unspecified: Secondary | ICD-10-CM | POA: Insufficient documentation

## 2019-06-24 DIAGNOSIS — R7989 Other specified abnormal findings of blood chemistry: Secondary | ICD-10-CM

## 2019-06-24 DIAGNOSIS — Z8249 Family history of ischemic heart disease and other diseases of the circulatory system: Secondary | ICD-10-CM | POA: Insufficient documentation

## 2019-06-24 DIAGNOSIS — I4891 Unspecified atrial fibrillation: Secondary | ICD-10-CM | POA: Insufficient documentation

## 2019-06-24 DIAGNOSIS — Z7951 Long term (current) use of inhaled steroids: Secondary | ICD-10-CM | POA: Insufficient documentation

## 2019-06-24 DIAGNOSIS — R946 Abnormal results of thyroid function studies: Secondary | ICD-10-CM

## 2019-06-24 DIAGNOSIS — E785 Hyperlipidemia, unspecified: Secondary | ICD-10-CM | POA: Insufficient documentation

## 2019-06-24 DIAGNOSIS — D701 Agranulocytosis secondary to cancer chemotherapy: Secondary | ICD-10-CM | POA: Insufficient documentation

## 2019-06-24 LAB — CBC WITH DIFF
BASOPHIL #: 0 10*3/uL (ref 0.00–0.20)
BASOPHIL %: 1 %
EOSINOPHIL #: 0.1 10*3/uL (ref 0.00–0.50)
EOSINOPHIL %: 2 %
HCT: 36.6 % (ref 34.6–46.2)
HGB: 11.9 g/dL (ref 11.8–15.8)
LYMPHOCYTE #: 0.7 10*3/uL — ABNORMAL LOW (ref 0.90–3.40)
LYMPHOCYTE %: 13 %
MCH: 24.6 pg — ABNORMAL LOW (ref 27.6–33.2)
MCHC: 32.4 g/dL — ABNORMAL LOW (ref 32.6–35.4)
MCV: 75.9 fL — ABNORMAL LOW (ref 82.3–96.7)
MONOCYTE #: 0.6 10*3/uL (ref 0.20–0.90)
MONOCYTE %: 11 %
MPV: 7.5 fL (ref 6.6–10.2)
NEUTROPHIL #: 3.8 10*3/uL (ref 1.50–6.40)
NEUTROPHIL %: 73 %
PLATELETS: 224 10*3/uL (ref 140–440)
RBC: 4.82 10*6/uL (ref 3.80–5.24)
RDW: 17.5 % — ABNORMAL HIGH (ref 12.4–15.2)
WBC: 5.2 10*3/uL (ref 3.5–10.3)

## 2019-06-24 LAB — COMPREHENSIVE METABOLIC PANEL, NON-FASTING
ALBUMIN: 3.5 g/dL (ref 3.2–4.6)
ALKALINE PHOSPHATASE: 75 U/L (ref 20–130)
ALT (SGPT): 17 U/L (ref <=52)
ANION GAP: 7 mmol/L
AST (SGOT): 24 U/L (ref <=35)
BILIRUBIN TOTAL: 0.6 mg/dL (ref 0.3–1.2)
BUN/CREA RATIO: 15
BUN: 12 mg/dL (ref 10–25)
CALCIUM: 8.4 mg/dL — ABNORMAL LOW (ref 8.8–10.3)
CHLORIDE: 105 mmol/L (ref 98–111)
CO2 TOTAL: 26 mmol/L (ref 21–35)
CREATININE: 0.78 mg/dL (ref <=1.30)
ESTIMATED GFR: >60 mL/min/{1.73_m2}
GLUCOSE: 111 mg/dL — ABNORMAL HIGH (ref 70–110)
POTASSIUM: 3.4 mmol/L — ABNORMAL LOW (ref 3.5–5.0)
PROTEIN TOTAL: 5.9 g/dL — ABNORMAL LOW (ref 6.0–8.3)
SODIUM: 138 mmol/L (ref 135–145)

## 2019-06-24 LAB — THYROID STIMULATING HORMONE (SENSITIVE TSH): TSH: 26.368 u[IU]/mL — ABNORMAL HIGH (ref 0.450–5.330)

## 2019-06-24 MED ORDER — OXYCODONE 5 MG TABLET
5.00 mg | ORAL_TABLET | Freq: Four times a day (QID) | ORAL | 0 refills | Status: DC | PRN
Start: 2019-06-24 — End: 2019-07-27

## 2019-06-24 MED ORDER — SODIUM CHLORIDE 0.9 % INTRAVENOUS SOLUTION
10.0000 mg/kg | Freq: Once | INTRAVENOUS | Status: AC
Start: 2019-06-24 — End: 2019-06-24
  Administered 2019-06-24: 12:00:00
  Administered 2019-06-24: 1250 mg via INTRAVENOUS
  Filled 2019-06-24: qty 25

## 2019-06-24 NOTE — Progress Notes (Signed)
[place]  [place] [id number]        Encounter Date: 06/24/2019   9:15 AM EST    Name:  Kelsey [name]  Age: 68 y.o.  DOB: March 29, 1951  Sex: female  PCP: [facility]    Chief Complaint:    Chief Complaint   Patient presents with   . Lung Cancer     HISTORY OF PRESENT ILLNESS:   68 y.o. female with history of stage III colon cancer of present evaluation and management of lung cancer.    1. September 2015. Patient underwent sigmoidoscopy and was found to have polyp which was adenocarcinoma.    2. June 10, 2014. She was admitted for segmental resection of sigmoid colon. This revealed pT3, N1a, MX, low-grade  adenocarcinoma of the colon. The mass measuring 4 x 3 x 0.5 centimeters. It  was low-grade and there was no evidence of microsatellite instability by  histology. Margins were uninvolved and 4 lymph nodes only were removed which  only 1 was involved. This corresponding stage IIIB, T3, N1a, M0, low-grade  sigmoid cancer.     3. July 07, 2014. Recommended adjuvant chemotherapy with FOLFOX.    4. July 27, 2014. Patient was started on adjuvant chemotherapy with FOLFOX.  Patient received only 5 treatments and chemotherapy was stopped due to poor tolerance.  After that patient lost follow-up.      5. October 2019. Patient was admitted to hospital with concern for  bowel obstruction.  CT chest showed lung nodules.  Patient was managed conservatively and discharged home.    6. May 06, 2018.  Patient underwent CT-guided biopsy of lung lesion.  Pathology showed well-differentiated adenocarcinoma.  Section shows well-differentiated adenocarcinoma which positive for CK 7 and TTF 1, negative for P 63 and CK 20.     7. June 23, 2018. PET scan showed Hypermetabolic right lower lobe pulmonary mass compatible with malignancy.  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 Hepatomegaly and  steatosis.     8. July 23, 2018. Patient was started on chemoradiation.    9. October 30, 2018. PET scan showed Hypermetabolic right lower lobe mass with abnormal mediastinal and hilar nodes. There has been only mild improvement since earlier exam.    10. October 31, 2018. Patient was started on maintenance immunotherapy with durvalumab.    11. February 17, 2019. CT chest revealed Moderate right effusion new from 12/19/2018. No change in the right lower lung mass or a presumed postradiation changes in the right upper lung when compared to 12/19/2018. Mass in the right cardiophrenic angle increased from 10/30/2019 6). Consider an enlarged lymph node here. This is unchanged from 12/19/2018 but increased since size from 10/30/2018. Another possibility is an enlarging pericardial cyst (this region was not hypermetabolic 4/68/0321). Mild enlargement of the left adrenal gland stable from 10/30/2018. Approximate 9 cm upper abdominal ventral hernia containing nonobstructed bowel loops unchanged    12. March 06, 2019. PET CT revealed 1. The RIGHT lower lobe mass has decreased in size and uptake, which is consistent with response to therapy. However, abnormal uptake remains. This is consistent with residual tumor. Suspect post radiation change in the RIGHT pneumothorax. Consolidation in the medial  RIGHT lung has tumor level uptake. Differential: Post radiation change, infection, and tumor infiltration. Additional findings are concerning for tumor infiltration. Follow-up recommended. Increased RIGHT hilar density and uptake. Concerning for tumor spread. Post radiation change less likely. 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 A low-attenuation mass in the RIGHT cardiophrenic angle may be related. Lymph node metastasis less likely.      SUBJECTIVE:  Patient presents for follow-up and treatment.  Denies any new issues since last visit.  Denies any worsening shortness of breath or chest pain.  Denies any fevers or chills.  Tolerating  treatment well.  Denies any headache, blurry vision or dizziness.       REVIEW OF SYSTEMS:  GENERAL:   Denies fever or chills  HEENT: no sore throat, congestion, blurry vision  Lungs:  Denies any worsening shortness of breath or cough.  GI: no nausea, vomiting, constipation, no diarrhea  GU: No dysuria, urgency, increased frequency   Cardiac:  Denies any chest pain, palpitations, syncope,   Neuro: no weakness, loss of function, numbness, tingling  Skin: no rash  Musculoskeletal: No myalgias  Psychosocial: No depression, anxiety  Hematologic: No easy bruising, abnormal bleeding  All other review ROS negative except those mentioned above.     PATIENT HISTORY:  Past Medical History:   Diagnosis Date   . A-fib (CMS HCC)    . Arthropathy, unspecified, site unspecified    . Asthma    . Cancer (CMS HCC)     cervical   . Cataract    . Colon cancer (CMS [facility]) 07/06/2014   . COPD (chronic obstructive pulmonary disease) (CMS HCC)    . Fistula    . HTN (hypertension)    . Hyperlipidemia    . Lung mass     with pleural effusion   . Obesity    . Sleep apnea    . Ventral hernia        Past Surgical History:   Procedure Laterality Date   . ABSCESS DRAINAGE     . BOWEL RESECTION     . CATARACT EXTRACTION     . HX CERVICAL CONE BIOPSY      . HX CESAREAN SECTION     . HX GASTRIC BYPASS      25 years ago   . [facility] (x2)   . HX HYSTERECTOMY      late 1990s   . HX LAP BANDING      2013   . HX LAPAROTOMY      2013 to remove lap band       Family Medical History:     Problem Relation (Age of Onset)    Diabetes Mother, Maternal Grandmother, Maternal Grandfather, Father    Heart Attack Mother    Stroke Father          Current Outpatient Medications   Medication Sig   . albuterol sulfate (PROVENTIL OR VENTOLIN OR PROAIR) 90 mcg/actuation Inhalation HFA Aerosol Inhaler INHALE 1 TO 2 PUFFS BY MOUTH EVERY 6 HOURS AS NEEDED   . albuterol sulfate (PROVENTIL) 2.5 mg /3 mL (0.083 %) Inhalation Solution for Nebulization 3 mL  (2.5 mg total) by Nebulization route Every 6 hours   . apixaban (ELIQUIS) 5 mg Oral Tablet Take 1 Tab (5 mg total) by mouth Twice daily   . atorvastatin (LIPITOR) 10 mg Oral Tablet Take 10 mg by mouth Once a day   . budesonide-formoteroL (SYMBICORT) 160-4.5 mcg/actuation Inhalation HFA  Aerosol Inhaler Take 2 Puffs by inhalation Twice daily   . carvediloL (COREG) 3.125 mg Oral Tablet Take 3.125 mg by mouth Twice daily with food   . docusate sodium (COLACE) 100 mg Oral Capsule Take 100 mg by mouth Three times a day as needed    . flecainide (TAMBOCOR) 100 mg Oral Tablet Take 100 mg by mouth Twice daily   . fluticasone propionate (FLONASE) 50 mcg/actuation Nasal Spray, Suspension 1 Spray by Each Nostril route Once a day   . levothyroxine (SYNTHROID) 25 mcg Oral Tablet Take 1 Tab (25 mcg total) by mouth Every morning   . levothyroxine (SYNTHROID) 50 mcg Oral Tablet Take 1 Tab (50 mcg total) by mouth Every morning   . linaCLOtide (LINZESS) 72 mcg Oral Capsule Take 72 mcg by mouth Every morning   . lisinopriL (PRINIVIL) 20 mg Oral Tablet Take 20 mg by mouth Once a day   . loperamide (IMODIUM) 2 mg Oral Capsule Take 2 mg by mouth Every 4 hours as needed   . loratadine (CLARITIN) 10 mg Oral Tablet Take 1 Tab (10 mg total) by mouth Once a day   . magnesium oxide (MAG-OX) 400 mg Oral Tablet Take 400 mg by mouth Once a day   . omeprazole (PRILOSEC) 20 mg Oral Capsule, Delayed Release(E.C.) Take 40 mg by mouth Once a day    . oxyCODONE (ROXICODONE) 5 mg Oral Tablet Take 1 Tab (5 mg total) by mouth Every 6 hours as needed for Pain   . potassium chloride (K-DUR) 20 mEq Oral Tab Sust.Rel. Particle/Crystal Take 20 mEq by mouth Once a day Takes 3 days a week   . prochlorperazine (COMPAZINE) 10 mg Oral Tablet Take 1 Tab (10 mg total) by mouth Four times a day as needed for Nausea/Vomiting   . rOPINIRole (REQUIP) 0.5 mg Oral Tablet Take 0.5 mg by mouth Every night     Social History     Socioeconomic History   . Marital status:  Married     Spouse name: Not on file   . Number of children: Not on file   . Years of education: Not on file   . Highest education level: Not on file   Tobacco Use   . Smoking status: Former Research scientist (life sciences)   . Smokeless tobacco: Never Used   . Tobacco comment: quit 10 yrs ago   Substance and Sexual Activity   . Alcohol use: Not Currently     Comment: rarely   . Drug use: No   Other Topics Concern   . Ability to Walk 1 Flight of Steps without SOB/CP No   . Ability To Do Own ADL's Yes       PHYSICAL EXAMINATION:  General Vitals: BP (!) 144/73   Pulse 71   Temp 36.6 C (97.8 F)   Ht 1.575 m (5' 2" )   Wt 122 kg (270 lb)   SpO2 95%   BMI 49.38 kg/m       PHYSICAL EXAM:   Consitutional: Appears fatigue  Eyes: EOMI. No discharge. No Jaundice.   ENT: mucous membranes dry. No posterior pharynx lesions.. Neck supple, No palpable masses   Heme/ Lymph: No Cervical, Inguinal, axillary lymh nodes. No Bruising.   Cardiovascular: S1, S2, No murmurs, rubs, or gallops.  Respiratory: clear B/L   Abdomen: Normal Bowel Sounds, nontender nondistended. No hepatosplenomegaly.   Musculoskeletal: No Edema to the extremities.   Skin: Normal turgor. No Rashes,skin lesions   Psychiatry: Normal Affect   Neuro:  No focal deficits. Alert and Oriented x 3      ENCOUNTER ORDERS:  Orders Placed This Encounter   . oxyCODONE (ROXICODONE) 5 mg Oral Tablet       LABORATORY/RADIOLOGICAL DATA: All pertinent labs/radiology were reviewed.   PET CT 03/06/19      IMPRESSION:  1. The RIGHT lower lobe mass has decreased in size and uptake, which is  consistent with response to therapy. However, abnormal uptake remains. This  is consistent with residual tumor.  2. Suspect post radiation change in the RIGHT pneumothorax. Consolidation  in the medial RIGHT lung has tumor level uptake. Differential: Post  radiation change, infection, and tumor infiltration. Additional findings  are concerning for tumor infiltration. Follow-up recommended.  3. Increased RIGHT hilar  density and uptake. Concerning for tumor spread.  Post radiation change less likely.  4. 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 A low-attenuation mass in the  RIGHT cardiophrenic angle may be related. Lymph node metastasis less  Likely.      ASSESSMENT AND PLAN:  68 y.o. female with history of stage III colon cancer status post 5 cycle of FOLFOX now presenting with stage III lung cancer.      ICD-10-CM    1. Malignant neoplasm of lung, unspecified laterality, unspecified part of lung (CMS HCC)  C34.90    2. Encounter for antineoplastic chemotherapy  Z51.11    3. Cancer related pain  G89.3        1. Adenocarcinoma lung:  Stage III.    CT brain to rule out metastatic disease.  NEGATIVE.   Started on chemoradiation carboplatin/Taxol along with radiation- July 23, 2018.  After completion of chemo radiation patient will get PET scan.  If continued to have good response will start maintenance immunotherapy for 1 year.   Completed 6/6 cycles of Taxol/Carboplatin and will start maintenance immunotherapy therapy following radiation therapy and PET scan    CHEMOTHERAPY:/RADIATION  CYCLE 15/26 DAY   DRUG DOSE TOTAL DOSE % ROUTE SCHEDULE   Carboplatin AUC- 2 205 mg    COMPLETED   Paclitaxel 45 mg/m2 108 mg    COMPLETED   Durvalumab     Q 2 wks                2. Neutropenia:  Due to chemotherapy.   Resolved.    3. CINV    Zofran PRN    4. Transaminitis: RESOLVED   Instructed to avoid tylenol/alcoholic beverages    5. Thrombocytopenia:  Resolved    6. Dyspnea/fevers: RESOLVED  Better     7. Hypokalemia   Patient on K+ at home   Potassium today 3.4    8. Pain   Patient reports chronic in nature.   Oxycodone every 6 hours     9. Hypothyroidism   Elevated TSH - TSH today 26.3          Return in about 2 weeks (around 07/08/2019) for cbc/diff, CMP, Treatment.      Vanetta Mulders, MD  Hematology/Oncology

## 2019-06-24 NOTE — Nurses Notes (Addendum)
0947 paged Dr. Deatra Canter to sign order and K+3.4. Tobias Alexander, RN  1031 no order for K+. left chest port flushed with 10 ml NS, +BR. imfinzi started to infuse over 32mins. Tobias Alexander, RN  [phone] Imfinzi complete. Pt denies complaints. Port flushed with 67ml NS, +BR and heparinized with 28ml of Heparin. Port deaccessed and dressed with Band-Aid. Pt left via wheel chair. Tobias Alexander, RN

## 2019-07-08 ENCOUNTER — Inpatient Hospital Stay (HOSPITAL_COMMUNITY): Payer: Medicare Other

## 2019-07-08 ENCOUNTER — Encounter (HOSPITAL_COMMUNITY): Payer: Self-pay | Admitting: Internal Medicine

## 2019-07-20 ENCOUNTER — Telehealth (INDEPENDENT_AMBULATORY_CARE_PROVIDER_SITE_OTHER): Payer: Medicare Other | Admitting: Cardiovascular Disease

## 2019-07-22 ENCOUNTER — Encounter (HOSPITAL_COMMUNITY): Payer: Self-pay | Admitting: Internal Medicine

## 2019-07-22 ENCOUNTER — Other Ambulatory Visit: Payer: Self-pay

## 2019-07-22 ENCOUNTER — Encounter (INDEPENDENT_AMBULATORY_CARE_PROVIDER_SITE_OTHER): Payer: Self-pay | Admitting: Nurse Practitioner

## 2019-07-22 ENCOUNTER — Inpatient Hospital Stay (HOSPITAL_COMMUNITY): Payer: Medicare Other

## 2019-07-22 NOTE — Progress Notes (Deleted)
[place] [id number]  [facility]  Video Visit     Name: Kelsey [name]  MRN: [id number]    Date: 07/22/2019  Age: 68 y.o.                            Patient's location: [place] [address]   Patient/family aware of provider location: Yes  Patient/family consent for video visit: Yes  Interview and observation performed by: Jacob Moores, APRN,FNP-BC    Chief Complaint: No chief complaint on file.    History of Present Illness:  Kelsey [name] is a 68 y.o. female being seen by cardiology for history of paroxysmal AFib maintained on flecainide.      BP=  HR=        Past Medical History:  She has a past medical history of A-fib (CMS [place]), Arthropathy, unspecified, site unspecified, Asthma, Cancer (CMS [place]), Cataract, Colon cancer (CMS [place]) (07/06/2014), COPD (chronic obstructive pulmonary disease) (CMS [place]), Fistula, HTN (hypertension), Hyperlipidemia, Lung mass, Obesity, Sleep apnea, and Ventral hernia.    Past Surgical History:  She has a past medical history of A-fib (CMS [place]), Arthropathy, unspecified, site unspecified, Asthma, Cancer (CMS [place]), Cataract, Colon cancer (CMS [place]) (07/06/2014), COPD (chronic obstructive pulmonary disease) (CMS [place]), Fistula, HTN (hypertension), Hyperlipidemia, Lung mass, Obesity, Sleep apnea, and Ventral hernia.    Problem List:  She has HTN (hypertension); Hyperlipidemia; Arthropathy, unspecified, site unspecified; COPD (chronic obstructive pulmonary disease) (CMS HCC); Ventral hernia; Colon cancer (CMS HCC); Right lower lobe lung mass; Malignant neoplasm of lung, unspecified laterality, unspecified part of lung (CMS HCC); Neutropenic fever (CMS HCC); Asthma; Atrial fibrillation with RVR (CMS HCC); Hx of echocardiogram; and Dyspnea on their problem list.    Medications:  .  albuterol sulfate  .  albuterol sulfate  .  apixaban  .  atorvastatin  .  budesonide-formoteroL  .  carvediloL  .  docusate  sodium  .  flecainide  .  fluticasone propionate  .  levothyroxine  .  levothyroxine  .  linaCLOtide  .  lisinopriL  .  loperamide  .  loratadine  .  magnesium oxide  .  omeprazole  .  oxyCODONE  .  potassium chloride  .  prochlorperazine  .  rOPINIRole     Review of Systems:  Review of Systems   Constitutional: Negative for activity change, chills, fever and unexpected weight change.   Respiratory: Negative for cough, chest tightness, shortness of breath and wheezing.    Cardiovascular: Negative for chest pain, palpitations and leg swelling.   Gastrointestinal: Negative for abdominal distention, abdominal pain and blood in stool.   Genitourinary: Negative for hematuria.   Neurological: Negative for dizziness, syncope, weakness, light-headedness and headaches.   All other systems reviewed and are negative.    Observational Exam:   Physical Exam   Constitutional: She is oriented to person, place, and time and well-developed, well-nourished, and in no distress.   HENT:   Head: Normocephalic.   Eyes: EOM are normal.   Neck: Normal range of motion.   Pulmonary/Chest: Effort normal. No respiratory distress.   Abdominal: She exhibits no distension.   Musculoskeletal: Normal range of motion.         General: No edema.   Neurological: She is alert and oriented to person, place, and time.   Skin: Skin is dry.   Psychiatric: Affect normal.  Vitals reviewed.        Data Reviewed:   Medical records reviewed including last clinic note and all admissions or encounters since last visit.   Most recent labs reviewed.    Echo report from  09/25/2018 reviewed.  Normal left ventricular size and LV systolic function EF 77-82%.  Endocardium not well seen.  There is mild primary mitral valve regurgitation.  Estimated PASP 20-25 mmHg.    MPS report from 04/03/2018 reviewed.  No significant ischemia with estimated EF 66%.      Assessment/Plan:    ICD-10-CM    1. Paroxysmal atrial fibrillation (CMS HCC)  I48.0    2. Essential hypertension   I10    3. Mixed hyperlipidemia  E78.2    4. Moderate asthma, unspecified whether complicated, unspecified whether persistent  J45.909    5. Malignant neoplasm of lung, unspecified laterality, unspecified part of lung (CMS HCC)  C34.90      No orders of the defined types were placed in this encounter.    Follow Up:  No follow-ups on file.      Jacob Moores, APRN,FNP-BC

## 2019-07-27 ENCOUNTER — Other Ambulatory Visit (HOSPITAL_COMMUNITY): Payer: Self-pay | Admitting: Internal Medicine

## 2019-07-28 MED ORDER — OXYCODONE 5 MG TABLET
5.00 mg | ORAL_TABLET | Freq: Four times a day (QID) | ORAL | 0 refills | Status: DC | PRN
Start: 2019-07-28 — End: 2019-08-31

## 2019-07-29 ENCOUNTER — Telehealth (INDEPENDENT_AMBULATORY_CARE_PROVIDER_SITE_OTHER): Payer: Medicare Other | Admitting: Nurse Practitioner

## 2019-07-29 DIAGNOSIS — C3490 Malignant neoplasm of unspecified part of unspecified bronchus or lung: Secondary | ICD-10-CM

## 2019-07-29 DIAGNOSIS — J45909 Unspecified asthma, uncomplicated: Secondary | ICD-10-CM

## 2019-07-29 DIAGNOSIS — E7801 Familial hypercholesterolemia: Secondary | ICD-10-CM

## 2019-07-29 DIAGNOSIS — I10 Essential (primary) hypertension: Secondary | ICD-10-CM

## 2019-07-29 DIAGNOSIS — I480 Paroxysmal atrial fibrillation: Secondary | ICD-10-CM

## 2019-07-29 MED ORDER — FLECAINIDE 100 MG TABLET
100.00 mg | ORAL_TABLET | Freq: Two times a day (BID) | ORAL | 3 refills | Status: AC
Start: 2019-07-29 — End: ?

## 2019-07-29 NOTE — Progress Notes (Signed)
[place], [place] [id number]  [facility]  Video Visit     Name: Kelsey [name]  MRN: [id number]    Date: 07/29/2019  Age: 68 y.o.                            Patient's location: [place] [address]   Patient/family aware of provider location: Yes  Patient/family consent for video visit: Yes  Interview and observation performed by: Jacob Moores, APRN,FNP-BC    Chief Complaint: Atrial Fibrillation    History of Present Illness:  Kelsey [name] is a 68 y.o. female following with cardiology for paroxysmal Afib on fleccanide.    Patient reports and that she completed chemo and radiation and is now on IV chemo every 2 weeks following with Dr. Deatra Canter.  She has chronic shortness of breath and says she has good days/bad days.  No lower extremity swelling.  States that she uses her walker to go out and feed her chickens with frequent stops due to shortness of breath.  She is also using exercise bands for range of motion and stretching as well as following a keto diet with weight loss.  She denies any complaints of chest pain, pressure, heaviness at rest or on exertion.  States that she only has occasional palpitations.  No dizziness, lightheadedness.  No symptoms indicating the return of AFib.  Requesting a refill on flecainide.  Average blood pressure 128/77. Refill fleccanide.    Past Medical History:  She has a past medical history of A-fib (CMS [place]), Arthropathy, unspecified, site unspecified, Asthma, Cancer (CMS [place]), Cataract, Colon cancer (CMS [place]) (07/06/2014), COPD (chronic obstructive pulmonary disease) (CMS [place]), Fistula, HTN (hypertension), Hyperlipidemia, Lung mass, Obesity, Sleep apnea, and Ventral hernia.    Past Surgical History:  She has a past medical history of A-fib (CMS [place]), Arthropathy, unspecified, site unspecified, Asthma, Cancer (CMS [place]), Cataract, Colon cancer (CMS [place]) (07/06/2014), COPD (chronic obstructive pulmonary  disease) (CMS [place]), Fistula, HTN (hypertension), Hyperlipidemia, Lung mass, Obesity, Sleep apnea, and Ventral hernia.    Problem List:  She has HTN (hypertension); Hyperlipidemia; Arthropathy, unspecified, site unspecified; COPD (chronic obstructive pulmonary disease) (CMS HCC); Ventral hernia; Colon cancer (CMS HCC); Right lower lobe lung mass; Malignant neoplasm of lung, unspecified laterality, unspecified part of lung (CMS HCC); Neutropenic fever (CMS HCC); Asthma; Atrial fibrillation with RVR (CMS HCC); Hx of echocardiogram; and Dyspnea on their problem list.    Medications:  .  albuterol sulfate  .  albuterol sulfate  .  apixaban  .  atorvastatin  .  budesonide-formoteroL  .  carvediloL  .  docusate sodium  .  flecainide  .  fluticasone propionate  .  levothyroxine  .  linaCLOtide  .  lisinopriL  .  loperamide  .  loratadine  .  magnesium oxide  .  omeprazole  .  oxyCODONE  .  potassium chloride  .  prochlorperazine  .  rOPINIRole     Review of Systems:  Review of Systems   Constitutional: Negative for activity change, chills, fever and unexpected weight change.   Respiratory: Positive for shortness of breath. Negative for cough, chest tightness and wheezing.         Chronic/stable   Cardiovascular: Positive for palpitations. Negative for chest pain and leg swelling.   Gastrointestinal: Negative for abdominal distention, abdominal pain and blood in stool.   Genitourinary: Negative  for hematuria.   Neurological: Negative for dizziness, syncope, weakness, light-headedness and headaches.   All other systems reviewed and are negative.    Observational Exam:   Physical Exam   Constitutional: She is oriented to person, place, and time and well-developed, well-nourished, and in no distress. No distress.   HENT:   Head: Normocephalic.   Eyes: EOM are normal.   Neck: Normal range of motion. No JVD present.   Pulmonary/Chest: Effort normal. No respiratory distress.   Abdominal: She exhibits no distension.      Musculoskeletal: Normal range of motion.         General: No edema.   Neurological: She is alert and oriented to person, place, and time.   Skin: Skin is dry.   Psychiatric: Affect normal.         Data Reviewed:   Medical records reviewed including last clinic note and all admissions or encounters since last visit.   Most recent labs reviewed.    Echo report from 09/25/2018 reviewed.   MPS report from 04/03/2018 reviewed. No significant ischemia with estimated EF 66%.    09/25/2018 TTE Conclusions:  1. Normal left ventricular size. LV Ejection Fraction is 50-55 %. Endocardium not well seen.  2. There is mild primary mitral valve regurgitation.  3. Estimated PA Pressure is 20-25 mmHg.    Assessment/Plan:      ICD-10-CM    1. Paroxysmal atrial fibrillation (CMS HCC)  I48.0    2. Essential hypertension  I10    3. 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 Malignant neoplasm of lung, unspecified laterality, unspecified part of lung (CMS HCC)  C34.90      Paroxysmal AFib:  controlled rate/regular rhythm per patient report.  Denies any return of AFib symptoms.  No CHF or lower extremity edema on exam.  Patient agreed to keep a log of BP/heart rate and call if 2 high/low.  If she has any symptoms of AFib or finds that her heart rate remains elevated at rest she has agreed to call back and we will obtain a monitor for frequency/rate of AFib as patient may need to go back on amiodarone.  With she is scheduled to follow-up with her PCP next week.  No bleeding issues on Eliquis 5 mg twice daily.  Patient is avoiding use of Compazine with flecainide and is opting to use Pepto-Bismol for nausea. No known interaction with flecanide.    Hypertension:  Stable per home readings.  Goal <130/<80.  Will continue current regimen.    Eliquis 5 mg twice daily, atorvastatin 10 mg daily, Coreg 3.125 mg twice daily, flecainide 100 mg twice daily,  levothyroxine 50 mics daily, lisinopril 20 mg daily, magnesium 400 mg daily, K-Dur 20 mEq 3 days a week    Discussed medication indication, benefits, and side effects.  Verbalized understanding.  Answered all questions.    Will seek emergency medical assistance as needed for chest pain or shortness of breath that occurs with rest, is new onset and not predictable, and/or happens more frequently, longer duration, presents less exertion.    Patient was seen independently with cosigning physician Dr. levels available for consultation.      Orders Placed This Encounter   . flecainide (TAMBOCOR) 100 mg Oral Tablet     Follow Up:  Return in about 6 months (around 01/27/2020) for AFib.     Jacob Moores, APRN,FNP-BC

## 2019-08-05 ENCOUNTER — Inpatient Hospital Stay (HOSPITAL_COMMUNITY): Payer: Medicare Other

## 2019-08-05 ENCOUNTER — Encounter (HOSPITAL_COMMUNITY): Payer: Self-pay | Admitting: Internal Medicine

## 2019-08-31 ENCOUNTER — Inpatient Hospital Stay
Admission: RE | Admit: 2019-08-31 | Discharge: 2019-08-31 | Disposition: A | Discharge status: Still In Hospital | Payer: Medicare Other | Source: Ambulatory Visit | Attending: Internal Medicine | Admitting: Internal Medicine

## 2019-08-31 ENCOUNTER — Telehealth (HOSPITAL_COMMUNITY): Payer: Self-pay | Admitting: Internal Medicine

## 2019-08-31 ENCOUNTER — Ambulatory Visit (HOSPITAL_COMMUNITY)
Admission: RE | Admit: 2019-08-31 | Discharge: 2019-08-31 | Disposition: A | Discharge status: Home / Self Care | Payer: Medicare Other | Source: Ambulatory Visit

## 2019-08-31 ENCOUNTER — Ambulatory Visit (HOSPITAL_BASED_OUTPATIENT_CLINIC_OR_DEPARTMENT_OTHER): Payer: Medicare Other | Admitting: Internal Medicine

## 2019-08-31 ENCOUNTER — Other Ambulatory Visit: Payer: Self-pay

## 2019-08-31 ENCOUNTER — Encounter (HOSPITAL_COMMUNITY): Payer: Self-pay | Admitting: Internal Medicine

## 2019-08-31 VITALS — BP 140/80 | HR 66 | Temp 98.8°F | Resp 16 | Ht 62.0 in | Wt 270.5 lb

## 2019-08-31 VITALS — BP 140/80 | HR 66 | Temp 98.8°F | Resp 16

## 2019-08-31 DIAGNOSIS — Z87891 Personal history of nicotine dependence: Secondary | ICD-10-CM | POA: Insufficient documentation

## 2019-08-31 DIAGNOSIS — I4891 Unspecified atrial fibrillation: Secondary | ICD-10-CM | POA: Insufficient documentation

## 2019-08-31 DIAGNOSIS — J449 Chronic obstructive pulmonary disease, unspecified: Secondary | ICD-10-CM | POA: Insufficient documentation

## 2019-08-31 DIAGNOSIS — Z85038 Personal history of other malignant neoplasm of large intestine: Secondary | ICD-10-CM | POA: Insufficient documentation

## 2019-08-31 DIAGNOSIS — Z5111 Encounter for antineoplastic chemotherapy: Secondary | ICD-10-CM

## 2019-08-31 DIAGNOSIS — R946 Abnormal results of thyroid function studies: Secondary | ICD-10-CM

## 2019-08-31 DIAGNOSIS — E039 Hypothyroidism, unspecified: Secondary | ICD-10-CM | POA: Insufficient documentation

## 2019-08-31 DIAGNOSIS — C3490 Malignant neoplasm of unspecified part of unspecified bronchus or lung: Secondary | ICD-10-CM

## 2019-08-31 DIAGNOSIS — Z8249 Family history of ischemic heart disease and other diseases of the circulatory system: Secondary | ICD-10-CM | POA: Insufficient documentation

## 2019-08-31 DIAGNOSIS — Z9119 Patient's noncompliance with other medical treatment and regimen: Secondary | ICD-10-CM | POA: Insufficient documentation

## 2019-08-31 DIAGNOSIS — R112 Nausea with vomiting, unspecified: Secondary | ICD-10-CM

## 2019-08-31 DIAGNOSIS — Z79899 Other long term (current) drug therapy: Secondary | ICD-10-CM | POA: Insufficient documentation

## 2019-08-31 DIAGNOSIS — G893 Neoplasm related pain (acute) (chronic): Secondary | ICD-10-CM | POA: Insufficient documentation

## 2019-08-31 DIAGNOSIS — Z7951 Long term (current) use of inhaled steroids: Secondary | ICD-10-CM | POA: Insufficient documentation

## 2019-08-31 DIAGNOSIS — Z9114 Patient's other noncompliance with medication regimen: Secondary | ICD-10-CM | POA: Insufficient documentation

## 2019-08-31 DIAGNOSIS — G4730 Sleep apnea, unspecified: Secondary | ICD-10-CM | POA: Insufficient documentation

## 2019-08-31 DIAGNOSIS — E785 Hyperlipidemia, unspecified: Secondary | ICD-10-CM | POA: Insufficient documentation

## 2019-08-31 DIAGNOSIS — D701 Agranulocytosis secondary to cancer chemotherapy: Secondary | ICD-10-CM | POA: Insufficient documentation

## 2019-08-31 DIAGNOSIS — R7989 Other specified abnormal findings of blood chemistry: Secondary | ICD-10-CM

## 2019-08-31 DIAGNOSIS — I10 Essential (primary) hypertension: Secondary | ICD-10-CM | POA: Insufficient documentation

## 2019-08-31 DIAGNOSIS — E876 Hypokalemia: Secondary | ICD-10-CM | POA: Insufficient documentation

## 2019-08-31 DIAGNOSIS — Z9884 Bariatric surgery status: Secondary | ICD-10-CM | POA: Insufficient documentation

## 2019-08-31 DIAGNOSIS — E669 Obesity, unspecified: Secondary | ICD-10-CM | POA: Insufficient documentation

## 2019-08-31 DIAGNOSIS — Z833 Family history of diabetes mellitus: Secondary | ICD-10-CM | POA: Insufficient documentation

## 2019-08-31 DIAGNOSIS — Z7901 Long term (current) use of anticoagulants: Secondary | ICD-10-CM | POA: Insufficient documentation

## 2019-08-31 DIAGNOSIS — Z5112 Encounter for antineoplastic immunotherapy: Secondary | ICD-10-CM | POA: Insufficient documentation

## 2019-08-31 LAB — CBC WITH DIFF
BASOPHIL #: 0 10*3/uL (ref 0.00–0.20)
BASOPHIL %: 1 %
EOSINOPHIL #: 0.1 10*3/uL (ref 0.00–0.50)
EOSINOPHIL %: 2 %
HCT: 36.1 % (ref 34.6–46.2)
HGB: 11.9 g/dL (ref 11.8–15.8)
LYMPHOCYTE #: 0.6 10*3/uL — ABNORMAL LOW (ref 0.90–3.40)
LYMPHOCYTE %: 11 %
MCH: 24.6 pg — ABNORMAL LOW (ref 27.6–33.2)
MCHC: 33 g/dL (ref 32.6–35.4)
MCV: 74.7 fL — ABNORMAL LOW (ref 82.3–96.7)
MONOCYTE #: 0.6 10*3/uL (ref 0.20–0.90)
MONOCYTE %: 10 %
MPV: 8 fL (ref 6.6–10.2)
NEUTROPHIL #: 4.6 10*3/uL (ref 1.50–6.40)
NEUTROPHIL %: 78 %
PLATELETS: 217 10*3/uL (ref 140–440)
RBC: 4.83 10*6/uL (ref 3.80–5.24)
RDW: 18.7 % — ABNORMAL HIGH (ref 12.4–15.2)
WBC: 5.9 10*3/uL (ref 3.5–10.3)

## 2019-08-31 LAB — COMPREHENSIVE METABOLIC PANEL, NON-FASTING
ALBUMIN: 3.4 g/dL (ref 3.2–4.6)
ALKALINE PHOSPHATASE: 59 U/L (ref 20–130)
ALT (SGPT): 21 U/L (ref <=52)
ANION GAP: 8 mmol/L
AST (SGOT): 24 U/L (ref <=35)
BILIRUBIN TOTAL: 0.6 mg/dL (ref 0.3–1.2)
BUN/CREA RATIO: 16
BUN: 13 mg/dL (ref 10–25)
CALCIUM: 8.2 mg/dL — ABNORMAL LOW (ref 8.8–10.3)
CHLORIDE: 105 mmol/L (ref 98–111)
CO2 TOTAL: 25 mmol/L (ref 21–35)
CREATININE: 0.8 mg/dL (ref <=1.30)
ESTIMATED GFR: >60 mL/min/{1.73_m2}
GLUCOSE: 129 mg/dL — ABNORMAL HIGH (ref 70–110)
POTASSIUM: 3.5 mmol/L (ref 3.5–5.0)
PROTEIN TOTAL: 5.7 g/dL — ABNORMAL LOW (ref 6.0–8.3)
SODIUM: 138 mmol/L (ref 135–145)

## 2019-08-31 LAB — THYROID STIMULATING HORMONE (SENSITIVE TSH): TSH: 84.117 u[IU]/mL — ABNORMAL HIGH (ref 0.450–5.330)

## 2019-08-31 MED ORDER — OXYCODONE 5 MG TABLET
5.00 mg | ORAL_TABLET | Freq: Four times a day (QID) | ORAL | 0 refills | Status: DC | PRN
Start: 2019-08-31 — End: 2019-10-05

## 2019-08-31 MED ORDER — LEVOTHYROXINE 112 MCG TABLET
112.00 ug | ORAL_TABLET | Freq: Every morning | ORAL | 4 refills | Status: DC
Start: 2019-08-31 — End: 2019-11-26

## 2019-08-31 MED ORDER — SODIUM CHLORIDE 0.9 % INTRAVENOUS SOLUTION
10.0000 mg/kg | Freq: Once | INTRAVENOUS | Status: AC
Start: 2019-08-31 — End: 2019-08-31
  Administered 2019-08-31: 1230 mg via INTRAVENOUS
  Administered 2019-08-31: 0 mg via INTRAVENOUS
  Filled 2019-08-31: qty 24.6

## 2019-08-31 NOTE — Progress Notes (Signed)
[place]  [place] [id number]        Encounter Date: 08/31/2019   9:00 AM EST    Name:  Kelsey [name]  Age: 69 y.o.  DOB: [birth year]  Sex: female  PCP: [facility]    Chief Complaint:    Chief Complaint   Patient presents with   . Treatment     HISTORY OF PRESENT ILLNESS:   69 y.o. female with history of stage III colon cancer of present evaluation and management of lung cancer.    1. September 2015. Patient underwent sigmoidoscopy and was found to have polyp which was adenocarcinoma.    2. June 10, 2014. She was admitted for segmental resection of sigmoid colon. This revealed pT3, N1a, MX, low-grade  adenocarcinoma of the colon. The mass measuring 4 x 3 x 0.5 centimeters. It  was low-grade and there was no evidence of microsatellite instability by  histology. Margins were uninvolved and 4 lymph nodes only were removed which  only 1 was involved. This corresponding stage IIIB, T3, N1a, M0, low-grade  sigmoid cancer.     3. July 07, 2014. Recommended adjuvant chemotherapy with FOLFOX.    4. July 27, 2014. Patient was started on adjuvant chemotherapy with FOLFOX.  Patient received only 5 treatments and chemotherapy was stopped due to poor tolerance.  After that patient lost follow-up.      5. October 2019. Patient was admitted to hospital with concern for  bowel obstruction.  CT chest showed lung nodules.  Patient was managed conservatively and discharged home.    6. May 06, 2018.  Patient underwent CT-guided biopsy of lung lesion.  Pathology showed well-differentiated adenocarcinoma.  Section shows well-differentiated adenocarcinoma which positive for CK 7 and TTF 1, negative for P 63 and CK 20.     7. June 23, 2018. PET scan showed Hypermetabolic right lower lobe pulmonary mass compatible with malignancy.  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 Hepatomegaly and  steatosis.     8. July 23, 2018. Patient was started on chemoradiation.    9. October 30, 2018. PET scan showed Hypermetabolic right lower lobe mass with abnormal mediastinal and hilar nodes. There has been only mild improvement since earlier exam.    10. October 31, 2018. Patient was started on maintenance immunotherapy with durvalumab.    11. February 17, 2019. CT chest revealed Moderate right effusion new from 12/19/2018. No change in the right lower lung mass or a presumed postradiation changes in the right upper lung when compared to 12/19/2018. Mass in the right cardiophrenic angle increased from 10/30/2019 6). Consider an enlarged lymph node here. This is unchanged from 12/19/2018 but increased since size from 10/30/2018. Another possibility is an enlarging pericardial cyst (this region was not hypermetabolic 1/65/5374). Mild enlargement of the left adrenal gland stable from 10/30/2018. Approximate 9 cm upper abdominal ventral hernia containing nonobstructed bowel loops unchanged    12. March 06, 2019. PET CT revealed 1. The RIGHT lower lobe mass has decreased in size and uptake, which is consistent with response to therapy. However, abnormal uptake remains. This is consistent with residual tumor. Suspect post radiation change in the RIGHT pneumothorax. Consolidation in the medial RIGHT  lung has tumor level uptake. Differential: Post radiation change, infection, and tumor infiltration. Additional findings are concerning for tumor infiltration. Follow-up recommended. Increased RIGHT hilar density and uptake. Concerning for tumor spread. Post radiation change less likely. 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 A low-attenuation mass in the RIGHT cardiophrenic angle may be related. Lymph node metastasis less likely.      SUBJECTIVE:  Patient presents for follow-up and treatment.  Denies any new issues since last visit.  Denies any worsening shortness of breath or chest pain.  Denies any fevers or chills.  Tolerating  treatment well.  Denies any headache, blurry vision or dizziness.  Beside fatigue she denies any other issues.      REVIEW OF SYSTEMS:  GENERAL:   Denies fever or chills  HEENT: no sore throat, congestion, blurry vision  Lungs:  Denies any worsening shortness of breath or cough.  GI: no nausea, vomiting, constipation, no diarrhea  GU: No dysuria, urgency, increased frequency   Cardiac:  Denies any chest pain, palpitations, syncope,   Neuro: no weakness, loss of function, numbness, tingling  Skin: no rash  Musculoskeletal: No myalgias  Psychosocial: No depression, anxiety  Hematologic: No easy bruising, abnormal bleeding  All other review ROS negative except those mentioned above.     PATIENT HISTORY:  Past Medical History:   Diagnosis Date   . A-fib (CMS HCC)    . Arthropathy, unspecified, site unspecified    . Asthma    . Cancer (CMS HCC)     cervical   . Cataract    . Colon cancer (CMS [place]) 07/06/2014   . COPD (chronic obstructive pulmonary disease) (CMS HCC)    . Fistula    . HTN (hypertension)    . Hyperlipidemia    . Lung mass     with pleural effusion   . Obesity    . Sleep apnea    . Ventral hernia        Past Surgical History:   Procedure Laterality Date   . ABSCESS DRAINAGE     . BOWEL RESECTION     . CATARACT EXTRACTION     . HX CERVICAL CONE BIOPSY      . HX CESAREAN SECTION     . HX GASTRIC BYPASS      25 years ago   . [place] (x2)   . HX HYSTERECTOMY      late 1990s   . HX LAP BANDING      2013   . HX LAPAROTOMY      2013 to remove lap band       Family Medical History:     Problem Relation (Age of Onset)    Diabetes Mother, Maternal Grandmother, Maternal Grandfather, Father    Heart Attack Mother    Stroke Father          Current Outpatient Medications   Medication Sig   . albuterol sulfate (PROVENTIL OR VENTOLIN OR PROAIR) 90 mcg/actuation Inhalation HFA Aerosol Inhaler INHALE 1 TO 2 PUFFS BY MOUTH EVERY 6 HOURS AS NEEDED   . albuterol sulfate (PROVENTIL) 2.5 mg /3 mL (0.083  %) Inhalation Solution for Nebulization 3 mL (2.5 mg total) by Nebulization route Every 6 hours   . apixaban (ELIQUIS) 5 mg Oral Tablet Take 1 Tab (5 mg total) by mouth Twice daily   . atorvastatin (LIPITOR) 10 mg Oral Tablet Take 10 mg by mouth Once a day   .  budesonide-formoteroL (SYMBICORT) 160-4.5 mcg/actuation Inhalation HFA Aerosol Inhaler Take 2 Puffs by inhalation Twice daily   . carvediloL (COREG) 3.125 mg Oral Tablet Take 3.125 mg by mouth Twice daily with food   . docusate sodium (COLACE) 100 mg Oral Capsule Take 100 mg by mouth Three times a day as needed    . flecainide (TAMBOCOR) 100 mg Oral Tablet Take 1 Tab (100 mg total) by mouth Twice daily   . fluticasone propionate (FLONASE) 50 mcg/actuation Nasal Spray, Suspension 1 Spray by Each Nostril route Once a day   . levothyroxine (SYNTHROID) 50 mcg Oral Tablet Take 1 Tab (50 mcg total) by mouth Every morning   . linaCLOtide (LINZESS) 72 mcg Oral Capsule Take 72 mcg by mouth Every morning   . lisinopriL (PRINIVIL) 20 mg Oral Tablet Take 20 mg by mouth Once a day   . loperamide (IMODIUM) 2 mg Oral Capsule Take 2 mg by mouth Every 4 hours as needed   . loratadine (CLARITIN) 10 mg Oral Tablet Take 1 Tab (10 mg total) by mouth Once a day   . magnesium oxide (MAG-OX) 400 mg Oral Tablet Take 400 mg by mouth Once a day   . omeprazole (PRILOSEC) 20 mg Oral Capsule, Delayed Release(E.C.) Take 40 mg by mouth Once a day    . oxyCODONE (ROXICODONE) 5 mg Oral Tablet Take 1 Tab (5 mg total) by mouth Every 6 hours as needed for Pain   . potassium chloride (K-DUR) 20 mEq Oral Tab Sust.Rel. Particle/Crystal Take 20 mEq by mouth Once a day Takes 3 days a week   . prochlorperazine (COMPAZINE) 10 mg Oral Tablet Take 1 Tab (10 mg total) by mouth Four times a day as needed for Nausea/Vomiting   . rOPINIRole (REQUIP) 0.5 mg Oral Tablet Take 0.5 mg by mouth Every night     Social History     Socioeconomic History   . Marital status: Married     Spouse name: Not on file   .  Number of children: Not on file   . Years of education: Not on file   . Highest education level: Not on file   Tobacco Use   . Smoking status: Former Research scientist (life sciences)   . Smokeless tobacco: Never Used   . Tobacco comment: quit 10 yrs ago   Substance and Sexual Activity   . Alcohol use: Not Currently     Comment: rarely   . Drug use: No   Other Topics Concern   . Ability to Walk 1 Flight of Steps without SOB/CP No   . Ability To Do Own ADL's Yes       PHYSICAL EXAMINATION:  General Vitals: BP (!) 140/80   Pulse 66   Temp 37.1 C (98.8 F) (Thermal Scan)   Resp 16   Ht 1.575 m (5' 2" )   Wt 123 kg (270 lb 8 oz)   SpO2 94%   BMI 49.48 kg/m       PHYSICAL EXAM:   Consitutional: Appears fatigue  Eyes: EOMI. No discharge. No Jaundice.   ENT: mucous membranes dry. No posterior pharynx lesions.. Neck supple, No palpable masses   Heme/ Lymph: No Cervical, Inguinal, axillary lymh nodes. No Bruising.   Cardiovascular: S1, S2, No murmurs, rubs, or gallops.  Respiratory: clear B/L   Abdomen: Normal Bowel Sounds, nontender nondistended. No hepatosplenomegaly.   Musculoskeletal: No Edema to the extremities.   Skin: Normal turgor. No Rashes,skin lesions   Psychiatry: Normal Affect   Neuro: No focal  deficits. Alert and Oriented x 3      ENCOUNTER ORDERS:  Orders Placed This Encounter   . CT CHEST ABDOMEN PELVIS WO IV CONTRAST   . CBC/DIFF   . COMPREHENSIVE METABOLIC PANEL, NON-FASTING   . THYROID STIMULATING HORMONE (SENSITIVE TSH)   . oxyCODONE (ROXICODONE) 5 mg Oral Tablet       LABORATORY/RADIOLOGICAL DATA: All pertinent labs/radiology were reviewed.   CBC  Diff   Lab Results   Component Value Date/Time    WBC 5.9 08/31/2019 08:47 AM    HGB 11.9 08/31/2019 08:47 AM    HCT 36.1 08/31/2019 08:47 AM    PLTCNT 217 08/31/2019 08:47 AM    RBC 4.83 08/31/2019 08:47 AM    MCV 74.7 (L) 08/31/2019 08:47 AM    MCHC 33.0 08/31/2019 08:47 AM    MCH 24.6 (L) 08/31/2019 08:47 AM    RDW 18.7 (H) 08/31/2019 08:47 AM    MPV 8.0 08/31/2019 08:47 AM     Lab Results   Component Value Date/Time    PMNS 78 08/31/2019 08:47 AM    LYMPHOCYTES 11 08/31/2019 08:47 AM    EOSINOPHIL 2 08/31/2019 08:47 AM    MONOCYTES 10 08/31/2019 08:47 AM    BASOPHILS 1 08/31/2019 08:47 AM    BASOPHILS 0.00 08/31/2019 08:47 AM    PMNABS 4.60 08/31/2019 08:47 AM    LYMPHSABS 0.60 (L) 08/31/2019 08:47 AM    EOSABS 0.10 08/31/2019 08:47 AM    MONOSABS 0.60 08/31/2019 08:47 AM    BASOSABS 0.10 11/10/2014 02:28 PM    BASABS 0.01 09/17/2018 08:24 AM        COMPREHENSIVE METABOLIC PANEL  Lab Results   Component Value Date    SODIUM 138 08/31/2019    POTASSIUM 3.5 08/31/2019    CHLORIDE 105 08/31/2019    CO2 25 08/31/2019    ANIONGAP 8 08/31/2019    BUN 13 08/31/2019    CREATININE 0.80 08/31/2019    GLUCOSE Negative 09/24/2018    CALCIUM 8.2 (L) 08/31/2019    PHOSPHORUS 3.2 03/12/2019    ALBUMIN 3.4 08/31/2019    TOTALPROTEIN 5.7 (L) 08/31/2019    ALKPHOS 59 08/31/2019    AST 24 08/31/2019    ALT 21 08/31/2019    BILIRUBINCON 0.2 03/22/2018     THYROID STIMULATING HORMONE  Lab Results   Component Value Date    TSH 84.117 (H) 08/31/2019         ASSESSMENT AND PLAN:  69 y.o. female with history of stage III colon cancer status post 5 cycle of FOLFOX now presenting with stage III lung cancer.      ICD-10-CM    1. Encounter for antineoplastic chemotherapy  Z51.11 CBC/DIFF     COMPREHENSIVE METABOLIC PANEL, NON-FASTING     THYROID STIMULATING HORMONE (SENSITIVE TSH)     CT CHEST ABDOMEN PELVIS WO IV CONTRAST   2. Malignant neoplasm of lung, unspecified laterality, unspecified part of lung (CMS HCC)  C34.90 CBC/DIFF     COMPREHENSIVE METABOLIC PANEL, NON-FASTING     THYROID STIMULATING HORMONE (SENSITIVE TSH)     CT CHEST ABDOMEN PELVIS WO IV CONTRAST   3. Hypothyroidism, unspecified type  E03.9 CBC/DIFF     COMPREHENSIVE METABOLIC PANEL, NON-FASTING     THYROID STIMULATING HORMONE (SENSITIVE TSH)     CT CHEST ABDOMEN PELVIS WO IV CONTRAST   4. Cancer related pain  G89.3 CBC/DIFF     COMPREHENSIVE  METABOLIC PANEL, NON-FASTING     THYROID STIMULATING HORMONE (  SENSITIVE TSH)     CT CHEST ABDOMEN PELVIS WO IV CONTRAST       1. Adenocarcinoma lung:  Stage III.    CT brain to rule out metastatic disease.  NEGATIVE.   Started on chemoradiation carboplatin/Taxol along with radiation- July 23, 2018.  After completion of chemo radiation patient will get PET scan.  If continued to have good response will start maintenance immunotherapy for 1 year.   Completed 6/6 cycles of Taxol/Carboplatin and will start maintenance immunotherapy therapy following radiation therapy and PET scan    CHEMOTHERAPY:/RADIATION  CYCLE 16/26 DAY   DRUG DOSE TOTAL DOSE % ROUTE SCHEDULE   Carboplatin AUC- 2 205 mg    COMPLETED   Paclitaxel 45 mg/m2 108 mg    COMPLETED   Durvalumab     Q 2 wks                2. Neutropenia:  Due to chemotherapy.   Resolved.    3. CINV    Zofran PRN    4. Transaminitis: RESOLVED   Instructed to avoid tylenol/alcoholic beverages    5. Thrombocytopenia:  Resolved    6. Dyspnea/fevers: RESOLVED  Better     7. Hypokalemia   Patient on K+ at home   Potassium today 3.5    8. Pain   Patient reports chronic in nature.   Oxycodone every 6 hours     9. Hypothyroidism   Elevated TSH - TSH today 84.1, will increase dose of Synthroid to 112 mcg.  Recommended to take the medication on an empty stomach on regular basis.  Noncompliance can increase risk for myxedema coma.          Return in about 2 weeks (around 09/14/2019) for cbc/diff, CMP, Treatment.      Vanetta Mulders, MD  Hematology/Oncology       CC:  [facility]  [place] [address]

## 2019-08-31 NOTE — Telephone Encounter (Signed)
Called pt with appt date and time for CT 1/22 @1230  . Order and proper instructions mailed to patient.

## 2019-08-31 NOTE — Nurses Notes (Addendum)
1113-left chest port flushed with 10 ml NS, +BR. imfinzi started to infuse over 60 mins. Ovid Curd, RN  1215-Imfinzi complete. Pt denies complaints. Port flushed with 62ml NS, +BR and heparinized with 61ml of Heparin. Port deaccessed and dressed with Band-Aid. Pt left via wheel chair. Ovid Curd, RN

## 2019-09-11 ENCOUNTER — Ambulatory Visit
Admission: RE | Admit: 2019-09-11 | Discharge: 2019-09-11 | Disposition: A | Discharge status: Home / Self Care | Payer: Medicare Other | Source: Ambulatory Visit | Attending: Internal Medicine | Admitting: Internal Medicine

## 2019-09-11 ENCOUNTER — Other Ambulatory Visit: Payer: Self-pay

## 2019-09-11 ENCOUNTER — Other Ambulatory Visit (HOSPITAL_COMMUNITY): Payer: Self-pay | Admitting: Nurse Practitioner

## 2019-09-11 DIAGNOSIS — G893 Neoplasm related pain (acute) (chronic): Secondary | ICD-10-CM | POA: Insufficient documentation

## 2019-09-11 DIAGNOSIS — E039 Hypothyroidism, unspecified: Secondary | ICD-10-CM | POA: Insufficient documentation

## 2019-09-11 DIAGNOSIS — C3490 Malignant neoplasm of unspecified part of unspecified bronchus or lung: Secondary | ICD-10-CM | POA: Insufficient documentation

## 2019-09-11 DIAGNOSIS — Z5111 Encounter for antineoplastic chemotherapy: Secondary | ICD-10-CM

## 2019-09-11 DIAGNOSIS — J90 Pleural effusion, not elsewhere classified: Secondary | ICD-10-CM | POA: Insufficient documentation

## 2019-09-14 ENCOUNTER — Telehealth (HOSPITAL_COMMUNITY): Payer: Self-pay | Admitting: Internal Medicine

## 2019-09-14 ENCOUNTER — Encounter (HOSPITAL_COMMUNITY): Payer: Self-pay | Admitting: Internal Medicine

## 2019-09-14 ENCOUNTER — Telehealth (HOSPITAL_COMMUNITY): Payer: Self-pay | Admitting: Nurse Practitioner

## 2019-09-14 ENCOUNTER — Inpatient Hospital Stay (HOSPITAL_COMMUNITY): Payer: Medicare Other

## 2019-09-14 NOTE — Telephone Encounter (Signed)
Reviewed scan with patient. Dr. Deatra Canter aware and states "good scan". Will review in further detail at her appointment. Omer Jack, APRN, FNP-C

## 2019-09-14 NOTE — Telephone Encounter (Signed)
Patient rescheduled treatment appointment today due to weather.  Coming in on Thursday but would like a call concerning her scans

## 2019-09-16 ENCOUNTER — Other Ambulatory Visit (HOSPITAL_COMMUNITY): Payer: Self-pay | Admitting: Internal Medicine

## 2019-09-16 DIAGNOSIS — C3490 Malignant neoplasm of unspecified part of unspecified bronchus or lung: Secondary | ICD-10-CM

## 2019-09-17 ENCOUNTER — Encounter (HOSPITAL_COMMUNITY): Payer: Self-pay | Admitting: Internal Medicine

## 2019-09-17 ENCOUNTER — Inpatient Hospital Stay (HOSPITAL_COMMUNITY): Payer: Medicare Other

## 2019-10-01 ENCOUNTER — Encounter (HOSPITAL_COMMUNITY): Payer: Self-pay | Admitting: Internal Medicine

## 2019-10-01 ENCOUNTER — Inpatient Hospital Stay (HOSPITAL_COMMUNITY)
Admission: RE | Admit: 2019-10-01 | Payer: Medicare Other | Source: Ambulatory Visit | Attending: Nurse Practitioner | Admitting: Nurse Practitioner

## 2019-10-05 ENCOUNTER — Other Ambulatory Visit (HOSPITAL_COMMUNITY): Payer: Self-pay | Admitting: Internal Medicine

## 2019-10-05 MED ORDER — OXYCODONE 5 MG TABLET
5.00 mg | ORAL_TABLET | Freq: Four times a day (QID) | ORAL | 0 refills | Status: DC | PRN
Start: 2019-10-05 — End: 2019-11-02

## 2019-10-15 ENCOUNTER — Encounter (HOSPITAL_BASED_OUTPATIENT_CLINIC_OR_DEPARTMENT_OTHER): Payer: Self-pay | Admitting: Ophthalmology

## 2019-10-22 ENCOUNTER — Encounter (HOSPITAL_COMMUNITY): Payer: Self-pay

## 2019-10-22 ENCOUNTER — Emergency Department
Admission: EM | Admit: 2019-10-22 | Discharge: 2019-10-22 | Discharge status: Against Medical Advice | Payer: Medicare Other | Attending: General Practice | Admitting: General Practice

## 2019-10-22 ENCOUNTER — Encounter (HOSPITAL_COMMUNITY): Payer: Self-pay | Admitting: Nurse Practitioner

## 2019-10-22 ENCOUNTER — Emergency Department (HOSPITAL_COMMUNITY)

## 2019-10-22 ENCOUNTER — Inpatient Hospital Stay (HOSPITAL_COMMUNITY): Payer: Medicare Other

## 2019-10-22 ENCOUNTER — Inpatient Hospital Stay (HOSPITAL_COMMUNITY): Payer: Medicare Other | Admitting: Adolescent Medicine

## 2019-10-22 ENCOUNTER — Other Ambulatory Visit: Payer: Self-pay

## 2019-10-22 DIAGNOSIS — I10 Essential (primary) hypertension: Secondary | ICD-10-CM

## 2019-10-22 DIAGNOSIS — J449 Chronic obstructive pulmonary disease, unspecified: Secondary | ICD-10-CM

## 2019-10-22 DIAGNOSIS — J90 Pleural effusion, not elsewhere classified: Secondary | ICD-10-CM | POA: Insufficient documentation

## 2019-10-22 DIAGNOSIS — Z5329 Procedure and treatment not carried out because of patient's decision for other reasons: Secondary | ICD-10-CM | POA: Insufficient documentation

## 2019-10-22 DIAGNOSIS — G2581 Restless legs syndrome: Secondary | ICD-10-CM | POA: Insufficient documentation

## 2019-10-22 DIAGNOSIS — K219 Gastro-esophageal reflux disease without esophagitis: Secondary | ICD-10-CM

## 2019-10-22 DIAGNOSIS — J440 Chronic obstructive pulmonary disease with acute lower respiratory infection: Secondary | ICD-10-CM | POA: Insufficient documentation

## 2019-10-22 DIAGNOSIS — I2510 Atherosclerotic heart disease of native coronary artery without angina pectoris: Secondary | ICD-10-CM

## 2019-10-22 DIAGNOSIS — I4891 Unspecified atrial fibrillation: Secondary | ICD-10-CM | POA: Insufficient documentation

## 2019-10-22 DIAGNOSIS — E785 Hyperlipidemia, unspecified: Secondary | ICD-10-CM | POA: Insufficient documentation

## 2019-10-22 DIAGNOSIS — J918 Pleural effusion in other conditions classified elsewhere: Secondary | ICD-10-CM | POA: Diagnosis present

## 2019-10-22 DIAGNOSIS — J189 Pneumonia, unspecified organism: Secondary | ICD-10-CM

## 2019-10-22 DIAGNOSIS — Z87891 Personal history of nicotine dependence: Secondary | ICD-10-CM | POA: Insufficient documentation

## 2019-10-22 DIAGNOSIS — Z7901 Long term (current) use of anticoagulants: Secondary | ICD-10-CM | POA: Insufficient documentation

## 2019-10-22 DIAGNOSIS — Z85038 Personal history of other malignant neoplasm of large intestine: Secondary | ICD-10-CM

## 2019-10-22 DIAGNOSIS — Z85118 Personal history of other malignant neoplasm of bronchus and lung: Secondary | ICD-10-CM

## 2019-10-22 DIAGNOSIS — R079 Chest pain, unspecified: Secondary | ICD-10-CM | POA: Insufficient documentation

## 2019-10-22 DIAGNOSIS — C3490 Malignant neoplasm of unspecified part of unspecified bronchus or lung: Secondary | ICD-10-CM | POA: Insufficient documentation

## 2019-10-22 DIAGNOSIS — E039 Hypothyroidism, unspecified: Secondary | ICD-10-CM | POA: Insufficient documentation

## 2019-10-22 DIAGNOSIS — G4700 Insomnia, unspecified: Secondary | ICD-10-CM | POA: Insufficient documentation

## 2019-10-22 LAB — CBC WITH DIFF
BASOPHIL #: 0 10*3/uL (ref 0.00–0.20)
BASOPHIL %: 1 %
EOSINOPHIL #: 0.2 10*3/uL (ref 0.00–0.50)
EOSINOPHIL %: 2 %
HCT: 36.8 % (ref 34.6–46.2)
HGB: 11.9 g/dL (ref 11.8–15.8)
LYMPHOCYTE #: 1 10*3/uL (ref 0.90–3.40)
LYMPHOCYTE %: 15 %
MCH: 23.6 pg — ABNORMAL LOW (ref 27.6–33.2)
MCHC: 32.2 g/dL — ABNORMAL LOW (ref 32.6–35.4)
MCV: 73.2 fL — ABNORMAL LOW (ref 82.3–96.7)
MONOCYTE #: 0.8 10*3/uL (ref 0.20–0.90)
MONOCYTE %: 12 %
MPV: 7 fL (ref 6.6–10.2)
NEUTROPHIL #: 4.7 10*3/uL (ref 1.50–6.40)
NEUTROPHIL %: 70 %
PLATELETS: 307 10*3/uL (ref 140–440)
RBC: 5.03 10*6/uL (ref 3.80–5.24)
RDW: 19.7 % — ABNORMAL HIGH (ref 12.4–15.2)
WBC: 6.7 10*3/uL (ref 3.5–10.3)

## 2019-10-22 LAB — COVID-19, FLU A/B, RSV RAPID BY PCR
INFLUENZA VIRUS TYPE A: NOT DETECTED
INFLUENZA VIRUS TYPE B: NOT DETECTED
RESPIRATORY SYNCTIAL VIRUS (RSV): NOT DETECTED
SARS-CoV-2: NOT DETECTED

## 2019-10-22 LAB — BASIC METABOLIC PANEL
ANION GAP: 6 mmol/L
BUN/CREA RATIO: 19
BUN: 13 mg/dL (ref 10–25)
CALCIUM: 8.6 mg/dL — ABNORMAL LOW (ref 8.8–10.3)
CHLORIDE: 106 mmol/L (ref 98–111)
CO2 TOTAL: 26 mmol/L (ref 21–35)
CREATININE: 0.7 mg/dL (ref <=1.30)
ESTIMATED GFR: >60 mL/min/{1.73_m2}
GLUCOSE: 104 mg/dL (ref 70–110)
POTASSIUM: 4.5 mmol/L (ref 3.5–5.0)
SODIUM: 138 mmol/L (ref 135–145)

## 2019-10-22 LAB — VENOUS BLOOD GAS
%FIO2 (VENOUS): 21 %
BASE EXCESS: 2 mmol/L (ref ?–2.0)
BICARBONATE (VENOUS): 25.8 mmol/L (ref 23.0–33.0)
O2 SATURATION (VENOUS): 62.7 % — ABNORMAL LOW (ref 95.0–98.0)
PCO2 (VENOUS): 44 mmHg (ref 38.00–50.00)
PH (VENOUS): 7.4 (ref 7.32–7.45)
PO2 (VENOUS): 38 mmHg (ref 30.0–50.0)

## 2019-10-22 LAB — CREATINE KINASE (CK), TOTAL, SERUM OR PLASMA: CREATINE KINASE: 46 U/L (ref <=170)

## 2019-10-22 LAB — PROCALCITONIN: PROCALCITONIN: <0.05 ng/mL (ref <0.50)

## 2019-10-22 LAB — B-TYPE NATRIURETIC PEPTIDE (BNP),PLASMA: BNP: 69 pg/mL (ref 0–100)

## 2019-10-22 LAB — LACTIC ACID LEVEL: LACTIC ACID: 0.9 mmol/L (ref <2.0)

## 2019-10-22 LAB — MAGNESIUM: MAGNESIUM: 1.9 mg/dL (ref 1.8–2.3)

## 2019-10-22 LAB — THYROID STIMULATING HORMONE WITH FREE T4 REFLEX: TSH: 3.633 u[IU]/mL (ref 0.450–5.330)

## 2019-10-22 LAB — TROPONIN-I: TROPONIN I: 0.01 ng/mL (ref <=0.04)

## 2019-10-22 LAB — PT/INR: INR: 1.08 (ref 0.80–1.10)

## 2019-10-22 MED ORDER — NITROGLYCERIN 2 % TRANSDERMAL OINTMENT - TUBE
0.50 [in_us] | TOPICAL_OINTMENT | TRANSDERMAL | Status: DC
Start: 2019-10-22 — End: 2019-10-22

## 2019-10-22 MED ORDER — PREDNISONE 10 MG TABLET
50.00 mg | ORAL_TABLET | Freq: Four times a day (QID) | ORAL | Status: DC
Start: 2019-10-22 — End: 2019-10-22
  Administered 2019-10-22: 50 mg via ORAL
  Filled 2019-10-22: qty 1

## 2019-10-22 MED ORDER — NITROGLYCERIN 0.4 MG SUBLINGUAL TABLET
0.40 mg | SUBLINGUAL_TABLET | SUBLINGUAL | Status: DC | PRN
Start: 2019-10-22 — End: 2019-10-22
  Administered 2019-10-22: 0.4 mg via SUBLINGUAL
  Filled 2019-10-22: qty 1

## 2019-10-22 MED ORDER — FAMOTIDINE 20 MG TABLET
20.00 mg | ORAL_TABLET | ORAL | Status: AC
Start: 2019-10-22 — End: 2019-10-22
  Administered 2019-10-22: 20 mg via ORAL
  Filled 2019-10-22: qty 1

## 2019-10-22 MED ORDER — ASPIRIN 81 MG CHEWABLE TABLET
324.00 mg | CHEWABLE_TABLET | ORAL | Status: AC
Start: 2019-10-22 — End: 2019-10-22
  Administered 2019-10-22: 324 mg via ORAL
  Filled 2019-10-22: qty 4

## 2019-10-22 MED ORDER — SODIUM CHLORIDE 0.9 % INTRAVENOUS SOLUTION
500.0000 mg | Freq: Once | INTRAVENOUS | Status: AC
Start: 2019-10-22 — End: 2019-10-22
  Administered 2019-10-22: 0 mg via INTRAVENOUS
  Administered 2019-10-22: 500 mg via INTRAVENOUS
  Filled 2019-10-22: qty 5

## 2019-10-22 MED ORDER — SODIUM CHLORIDE 0.9 % INTRAVENOUS SOLUTION
1.0000 g | INTRAVENOUS | Status: DC
Start: 2019-10-22 — End: 2019-10-22
  Administered 2019-10-22: 0 g via INTRAVENOUS
  Administered 2019-10-22: 1 g via INTRAVENOUS
  Filled 2019-10-22: qty 10

## 2019-10-22 MED ORDER — AMOXICILLIN 875 MG-POTASSIUM CLAVULANATE 125 MG TABLET
1.00 | ORAL_TABLET | Freq: Two times a day (BID) | ORAL | 0 refills | Status: AC
Start: 2019-10-22 — End: 2019-11-01

## 2019-10-22 MED ORDER — DIPHENHYDRAMINE 25 MG CAPSULE
50.0000 mg | ORAL_CAPSULE | Freq: Once | ORAL | Status: DC
Start: 2019-10-23 — End: 2019-10-22

## 2019-10-22 NOTE — ED Provider Notes (Signed)
[place]  Emergency Department  Resident Note    Identification:  Name: Kelsey [name]  Age and Gender: 69 y.o. female  Date of Birth: 13-Oct-1950  Date of Service: 10/22/2019  MRN: [id number]  PCP: [facility]    Arrival: The patient arrived by private car and is accompanied by spouse.    History Obtained from: Patient. EMR.    History Limitations: none     CC:  Chest pain     HPI:  Kelsey [name] is a 69 y.o. White female presenting with chief complaint of chest pain. States that she has had this same chest pain off and on for the past week, describes it as a heaviness in her chest that worsens with exertion. Also complaining of worsening shortness of breath over the past month that is also worse with exertion. Complaints of indigestion that she has had for "a long time" and normally takes Prilosec at home.  Also complaining of some generalized muscle aches that started about 1 week ago as well. Denies heart palpitations, fever, sweats, chills, n/v/d/c. Had a heart cath about 3 years ago, states she had no stents placed at that time.   Patient is currently being treated for lung cancer with Dr. Deatra Canter. Former smoker, quit smoking about 10 years ago. Denies alcohol and illicit drug use.     REVIEW OF SYSTEMS:  General: (-) pain. (-) fevers (-) chills. (-) weight loss. (-) fatigue.  Lymphatic: (-) palpable masses. (-) night sweats.  Heme: (-) easy bruising (-) bleeding. (-) recurrent infections.   HEENT. (-) vision changes (-) hearing changes. (-) dysphagia. (-) sore throat.   Heart: (-) chest pain. (-) palpitation. (-) orthopnea. (-) LE edema.   Lungs: (-) dyspnea (on exertion) (-) hemoptysis. (-) cough.   Abdomen: (-) poor appetite. (-) abdominal pain. (-) nausea (-) vomiting. (-) diarrhea. (-) constipation.   GU: (-) dysuria (-) Urgency. (-) Hematuria.   MS. (-) joint pain (-) ext swelling. (-) Back pain.   Dermatologic: (-) rashes. (-) pruritus, (-) Lymphadenopathy    Psychiatric: (-)  Depression. (-) anxiety. (-) insomnia (-) SI/HI/AVH, (-) EtOH, (-) Tobacco, (-) Marijuana, (-) Cocaine, (-) Amphetamines (-) Heroin, (-) opiates  Neurologic: (-) headaches. (-) neuropathy. (-) weakness. (-) memory problems, (-) Confusion, (-) Focal deficits, (-) parasthesia  All other systems reviewed and are negative.    Medications:  Medications reviewed with patient/patient's family.  Medications Prior to Admission     Prescriptions    albuterol sulfate (PROVENTIL OR VENTOLIN OR PROAIR) 90 mcg/actuation Inhalation HFA Aerosol Inhaler    INHALE 1 TO 2 PUFFS BY MOUTH EVERY 6 HOURS AS NEEDED    albuterol sulfate (PROVENTIL) 2.5 mg /3 mL (0.083 %) Inhalation Solution for Nebulization    3 mL (2.5 mg total) by Nebulization route Every 6 hours    apixaban (ELIQUIS) 5 mg Oral Tablet    Take 1 Tab (5 mg total) by mouth Twice daily    atorvastatin (LIPITOR) 10 mg Oral Tablet    Take 10 mg by mouth Once a day    budesonide-formoteroL (SYMBICORT) 160-4.5 mcg/actuation Inhalation HFA Aerosol Inhaler    Take 2 Puffs by inhalation Twice daily    carvediloL (COREG) 3.125 mg Oral Tablet    Take 3.125 mg by mouth Twice daily with food    docusate sodium (COLACE) 100 mg Oral Capsule    Take 100 mg by mouth Three times a day as needed  flecainide (TAMBOCOR) 100 mg Oral Tablet    Take 1 Tab (100 mg total) by mouth Twice daily    fluticasone propionate (FLONASE) 50 mcg/actuation Nasal Spray, Suspension    1 Spray by Each Nostril route Once a day    levothyroxine (SYNTHROID) 112 mcg Oral Tablet    Take 1 Tab (112 mcg total) by mouth Every morning    levothyroxine (SYNTHROID) 50 mcg Oral Tablet    Take 1 Tab (50 mcg total) by mouth Every morning    linaCLOtide (LINZESS) 72 mcg Oral Capsule    Take 72 mcg by mouth Every morning    lisinopriL (PRINIVIL) 20 mg Oral Tablet    Take 20 mg by mouth Once a day    loperamide (IMODIUM) 2 mg Oral Capsule    Take 2 mg by mouth Every 4 hours as needed    loratadine (CLARITIN) 10 mg Oral Tablet       Take 1 Tab (10 mg total) by mouth Once a day    magnesium oxide (MAG-OX) 400 mg Oral Tablet    Take 400 mg by mouth Once a day    omeprazole (PRILOSEC) 20 mg Oral Capsule, Delayed Release(E.C.)    Take 40 mg by mouth Once a day     oxyCODONE (ROXICODONE) 5 mg Oral Tablet    Take 1 Tablet (5 mg total) by mouth Every 6 hours as needed for Pain    potassium chloride (K-DUR) 20 mEq Oral Tab Sust.Rel. Particle/Crystal    Take 20 mEq by mouth Once a day Takes 3 days a week    prochlorperazine (COMPAZINE) 10 mg Oral Tablet    Take 1 Tab (10 mg total) by mouth Four times a day as needed for Nausea/Vomiting    rOPINIRole (REQUIP) 0.5 mg Oral Tablet    Take 0.5 mg by mouth Every night          Allergies:  Allergies reviewed with patient/patient's family.  Allergies   Allergen Reactions   . Shellfish Containing Products Shortness of Breath, Rash and Itching     scallops   . Vancomycin Shortness of Breath   . Barium Iodide    . Iodine And Iodide Containing Products      Itching, shortness of breath        PMSFSH:  Past medical, surgical, family and social history reviewed with patient/patient's family.  Past Medical History:   Diagnosis Date   . A-fib (CMS HCC)    . Arthropathy, unspecified, site unspecified    . Asthma    . Cancer (CMS HCC)     cervical   . Cataract    . Colon cancer (CMS [place]) 07/06/2014   . COPD (chronic obstructive pulmonary disease) (CMS HCC)    . Fistula    . HTN (hypertension)    . Hyperlipidemia    . Lung mass     with pleural effusion   . Obesity    . Sleep apnea    . Ventral hernia      Past Surgical History:   Procedure Laterality Date   . Abscess drainage     . Bowel resection     . Cataract extraction     . Hx cervical cone biopsy      . Hx cesarean section     . Hx gastric bypass     . Hx hernia repair     . Hx hysterectomy     . Hx lap banding     .  Hx laparotomy       Family History   Problem Relation Age of Onset   . Diabetes Mother    . Heart Attack Mother    . Diabetes Maternal Grandmother     . Diabetes Maternal Grandfather    . Diabetes Father    . Stroke Father      Social History     Socioeconomic History   . Marital status: Married     Spouse name: Not on file   . Number of children: Not on file   . Years of education: Not on file   . Highest education level: Not on file   Occupational History   . Not on file   Tobacco Use   . Smoking status: Former Research scientist (life sciences)   . Smokeless tobacco: Never Used   . Tobacco comment: quit 10 yrs ago   Substance and Sexual Activity   . Alcohol use: Not Currently     Comment: rarely   . Drug use: No   . Sexual activity: Not on file   Other Topics Concern   . Ability to Walk 1 Flight of Steps without SOB/CP No   . Routine Exercise Not Asked   . Ability to Walk 2 Flight of Steps without SOB/CP Not Asked   . Unable to Ambulate Not Asked   . Total Care Not Asked   . Ability To Do Own ADL's Yes   . Uses Walker Not Asked   . Other Activity Level Not Asked   . Uses Cane Not Asked   Social History Narrative   . Not on file     Social Determinants of Health     Financial Resource Strain:    . Difficulty of Paying Living Expenses:    Food Insecurity:    . Worried About Charity fundraiser in the Last Year:    . Arboriculturist in the Last Year:    Transportation Needs:    . Film/video editor (Medical):    Marland Kitchen Lack of Transportation (Non-Medical):    Physical Activity:    . Days of Exercise per Week:    . Minutes of Exercise per Session:    Stress:    . Feeling of Stress :    Intimate Partner Violence:    . Fear of Current or Ex-Partner:    . Emotionally Abused:    Marland Kitchen Physically Abused:    . Sexually Abused:        Pertinent Physical Exam:   All nurse's notes reviewed.  BP (!) 143/89   Pulse 92   Temp 36.7 C (98 F)   Resp 20   Ht 1.575 m (_0 )   Wt 122 kg (268 lb 14.4 oz)   SpO2 95%   BMI 49.18 kg/m       Constitutional: NAD. A&Ox3. Good historian. family at bedside.  Head: NC/AT. MMM. PERRLA. EOMI.  Neck: No JVD. No adenopathy.  Heart: RRR. No m/r/g.  Lungs: CTAB. No  distress.  Abdomen: +BS. Soft. NT/ND.   Musculoskeletal: No deformity or edema.  Skin: Warm and dry. No rash, erythema, jaundice, cyanosis.  Neurologic: CNs grossly intact. Neurovascularly intact bilaterally. No focal neurological deficits.  Psychiatric: Normal affect and mood.    Orders:  Results up to the Time the Disposition was Entered   BASIC METABOLIC PANEL - Abnormal; Notable for the following components:       Result Value    CALCIUM 8.6 (*)  All other components within normal limits    Narrative:     Estimated Glomerular Filtration Rate (eGFR) calculated using the CKD-EPI (2009) equation, intended for patients 32 years of age and older. If race and/or gender is not documented or "unknown," there will be no eGFR calculation.   CBC WITH DIFF - Abnormal; Notable for the following components:    MCV 73.2 (*)     MCH 23.6 (*)     MCHC 32.2 (*)     RDW 19.7 (*)     All other components within normal limits   VENOUS BLOOD GAS - Abnormal; Notable for the following components:    O2 SATURATION (VENOUS) 62.7 (*)     All other components within normal limits   B-TYPE NATRIURETIC PEPTIDE - Normal   PT/INR - Normal    Narrative:     Coumadin Therapy INR range for Conventional Anticoagulation Therapy is 2.0 to 3.0 and for Intensive Anticoagulation Therapy 2.5 to 3.5   THYROID STIMULATING HORMONE WITH FREE T4 REFLEX - Normal   TROPONIN-I - Normal   MAGNESIUM - Normal   CREATINE KINASE (CK), TOTAL, SERUM - Normal   LACTIC ACID LEVEL - Normal   PROCALCITONIN - Normal   COVID-19, FLU A/B, RSV RAPID BY PCR - Normal    Narrative:     Results are for the simultaneous qualitative identification of SARS-CoV-2 (formerly 2019-nCoV), Influenza A, Influenza B, and RSV RNA. These etiologic agents are generally detectable in nasopharyngeal and nasal swabs during the ACUTE PHASE of infection. Hence, this test is intended to be performed on respiratory specimens collected from individuals with signs and symptoms of upper respiratory  tract infection who meet Centers for Disease Control and Prevention (CDC) clinical and/or epidemiological criteria for Coronavirus Disease 2019 (COVID-19) testing. CDC COVID-19 criteria for testing on human specimens is available at [facility] webpage information for Healthcare Professionals: Coronavirus Disease 2019 (COVID-19) ([email or web address]).     False-negative results may occur if the virus has genomic mutations, insertions, deletions, or rearrangements or if performed very early in the course of illness. Otherwise, negative results indicate virus specific RNA targets are not detected, however negative results do not preclude SARS-CoV-2 infection/COVID-19, Influenza, or Respiratory syncytial virus infection. Results should not be used as the sole basis for patient management decisions. Negative results must be combined with clinical observations, patient history, and epidemiological information. If upper respiratory tract infection is still suspected based on exposure history together with other clinical findings, re-testing should be considered.    Disclaimer:   This assay has been authorized by FDA under an Emergency Use Authorization for use in laboratories certified under the Clinical Laboratory Improvement Amendments of 1988 (CLIA), 42 U.S.C. [phone], to perform high complexity tests. The impacts of vaccines, antiviral therapeutics, antibiotics, chemotherapeutic or immunosuppressant drugs have not been evaluated.     Test methodology:   Cepheid Xpert Xpress SARS-CoV-2/Flu/RSV Assay real-time polymerase chain reaction (RT-PCR) test on the GeneXpert Dx and Xpert Xpress systems.   ECG 12 LEAD - ED USE    Narrative:     -------------------------ECG Interpretation-----------------------------------  Sinus rhythm  .  Marland Kitchen  MD Signature: Unconfirmed Diagnosis   CBC/DIFF    Narrative:     The following orders were created for panel order CBC/DIFF.  Procedure  Abnormality         Status                     ---------                               -----------         ------                     CBC WITH ZOXW[[id number]]                Abnormal            Final result                 Please view results for these tests on the individual orders.   XR AP MOBILE CHEST    Narrative:     Ashlie Nill    PROCEDURE DESCRIPTION: XR AP MOBILE CHEST    CLINICAL INDICATION: Chest pain    TECHNIQUE: 1 views / 1 images submitted.    COMPARISON: March 11, 2019      FINDINGS: AP portable upright chest:    Right-sided chemotherapy infusion port is in place with its tip near the  cavoatrial notch. Prominence of the right hilum noted. Moderate blunting  right CP angle region noted consistent with small to moderate-sized  effusion. Some subtle increased markings left perihilar region noted.     CONTINUOUS CARDIAC MONITORING (ED USE ONLY)   PULSE OXIMETRY   INSERT & MAINTAIN PERIPHERAL IV ACCESS   ADULT ROUTINE BLOOD CULTURE, SET OF 2 BOTTLES (BACTERIA AND YEAST)   ADULT ROUTINE BLOOD CULTURE, SET OF 2 BOTTLES (BACTERIA AND YEAST)   ENHANCED DROPLET ISOLATION (FOR COVID-19)   nitroGLYCERIN (NITROSTAT) sublingual tablet (0.4 mg Sublingual Given 10/22/19 1843)   cefTRIAXone (ROCEPHIN) 1 g in NS 50 mL IVPB (0 g Intravenous Stopped 10/22/19 1924)   predniSONE (DELTASONE) tablet 50 mg (50 mg Oral Given 10/22/19 1947)   diphenhydrAMINE (BENADRYL) capsule (has no administration in time range)   aspirin chewable tablet 324 mg (324 mg Oral Given 10/22/19 1843)   azithromycin (ZITHROMAX) 500 mg in NS 250 mL IVPB (500 mg Intravenous New Bag/New Syringe 10/22/19 1924)   famotidine (PEPCID) tablet (20 mg Oral Given 10/22/19 1948)     Course:   Patient seen and examined. Vital signs stable and within normal limits. Physical exam as above.  Patient given nitro with improvement of indigestion. Also given Rocephin and Azithromycin.  Results were discussed with the patient and patient's family. They were given  the opportunity to ask questions. Patient and family agreeable to plan.    EKG: normal sinus rhythm   CBC, BMP, BNP, CK, TSH, troponin, lactic acid and procalcitonin WNL.     XR AP MOBILE CHEST   Final Result   1. Soft tissue prominence right perihilar soft tissues similar to March 11, 2019.   2. Development right-sided pleural effusion.   3. Possible left perihilar infiltrate on today's study. Follow-up   recommended.         DRD               Radiologist location ID: [id number]           Consults:   Hospitalist - Dr. Jerilynn Birkenhead     Impression:   Encounter Diagnoses   Name Primary?   . Chest pain, unspecified type  Yes   . Pleural effusion    . Chest pain of uncertain etiology        Disposition:    Patient will be admitted to hospitalist service (Dr. Jerilynn Birkenhead)  for further evaluation and management of chest pain and pleural effusion.     **Addendum  After patient spoke with hospitalist she decided she no longer wanted to be admitted to the hospital. States that she "would prefer to sleep in her own bed". Risks of leaving AMA were explained by the hospitalist and myself prior to her discharge. Patient expressed understanding and stated she still would like to leave.       Carlynn Spry, DO  10/22/2019, 20:35   [facility]

## 2019-10-22 NOTE — H&P (Signed)
[facility]   Hospitalist Medicine  History and Physical    Date of Admission: 10/22/19  Kelsey [name]  03/28/1951  [id number]  PCP: [place]      HPI   69 year old white female presenting with chief complaint of chest pain on and off for the past week.  Describing it as a heaviness in her chest that worsens on exertion.  She has a notable history of CAD lung cancer with pleural effusion AFib on anticoagulation, asthma, COPD.  She says over the past week her shortness of breath has been getting worse upon doing her daily activities she also complains of indigestion that she has had for an extended period of time.  She also complains of generalized weakness with muscle aches that began about a week ago.  Vital sign in the ED unremarkable, afebrile, saturating well on nasal cannula.  BMP also unremarkable as well as ABG BNP 69, with a negative procalcitonin.  Chest x-ray showing soft tissue prominence in the right perihilar area with right-sided effusion and possible left perihilar infiltrate.  With history of lung cancer patient will be admitted for community-acquired pneumonia and right-sided pleural effusion.    Past Medical History:   Past Medical History:   Diagnosis Date   . A-fib (CMS HCC)    . Arthropathy, unspecified, site unspecified    . Asthma    . Cancer (CMS HCC)     cervical   . Cataract    . Colon cancer (CMS [place]) 07/06/2014   . COPD (chronic obstructive pulmonary disease) (CMS HCC)    . Fistula    . HTN (hypertension)    . Hyperlipidemia    . Lung mass     with pleural effusion   . Obesity    . Sleep apnea    . Ventral hernia      Past Surgical History:   Past Surgical History:   Procedure Laterality Date   . Abscess drainage     . Bowel resection     . Cataract extraction     . Hx cervical cone biopsy      . Hx cesarean section     . Hx gastric bypass     . Hx hernia repair     . Hx hysterectomy     . Hx lap banding     . Hx laparotomy       Family History:   Family  History   Problem Relation Age of Onset   . Diabetes Mother    . Heart Attack Mother    . Diabetes Maternal Grandmother    . Diabetes Maternal Grandfather    . Diabetes Father    . Stroke Father      Social History:   Social History     Tobacco Use   . Smoking status: Former Research scientist (life sciences)   . Smokeless tobacco: Never Used   . Tobacco comment: quit 10 yrs ago   Substance Use Topics   . Alcohol use: Not Currently     Comment: rarely     Medications:   Medications Prior to Admission     Prescriptions    albuterol sulfate (PROVENTIL OR VENTOLIN OR PROAIR) 90 mcg/actuation Inhalation HFA Aerosol Inhaler    INHALE 1 TO 2 PUFFS BY MOUTH EVERY 6 HOURS AS NEEDED    albuterol sulfate (PROVENTIL) 2.5 mg /3 mL (0.083 %) Inhalation Solution for Nebulization    3 mL (2.5 mg total) by Nebulization route Every  6 hours    apixaban (ELIQUIS) 5 mg Oral Tablet    Take 1 Tab (5 mg total) by mouth Twice daily    atorvastatin (LIPITOR) 10 mg Oral Tablet    Take 10 mg by mouth Once a day    budesonide-formoteroL (SYMBICORT) 160-4.5 mcg/actuation Inhalation HFA Aerosol Inhaler    Take 2 Puffs by inhalation Twice daily    carvediloL (COREG) 3.125 mg Oral Tablet    Take 3.125 mg by mouth Twice daily with food    docusate sodium (COLACE) 100 mg Oral Capsule    Take 100 mg by mouth Three times a day as needed     flecainide (TAMBOCOR) 100 mg Oral Tablet    Take 1 Tab (100 mg total) by mouth Twice daily    fluticasone propionate (FLONASE) 50 mcg/actuation Nasal Spray, Suspension    1 Spray by Each Nostril route Once a day    levothyroxine (SYNTHROID) 112 mcg Oral Tablet    Take 1 Tab (112 mcg total) by mouth Every morning    levothyroxine (SYNTHROID) 50 mcg Oral Tablet    Take 1 Tab (50 mcg total) by mouth Every morning    linaCLOtide (LINZESS) 72 mcg Oral Capsule    Take 72 mcg by mouth Every morning    lisinopriL (PRINIVIL) 20 mg Oral Tablet    Take 20 mg by mouth Once a day    loperamide (IMODIUM) 2 mg Oral Capsule    Take 2 mg by mouth Every 4  hours as needed    loratadine (CLARITIN) 10 mg Oral Tablet    Take 1 Tab (10 mg total) by mouth Once a day    magnesium oxide (MAG-OX) 400 mg Oral Tablet    Take 400 mg by mouth Once a day    omeprazole (PRILOSEC) 20 mg Oral Capsule, Delayed Release(E.C.)    Take 40 mg by mouth Once a day     oxyCODONE (ROXICODONE) 5 mg Oral Tablet    Take 1 Tablet (5 mg total) by mouth Every 6 hours as needed for Pain    potassium chloride (K-DUR) 20 mEq Oral Tab Sust.Rel. Particle/Crystal    Take 20 mEq by mouth Once a day Takes 3 days a week    prochlorperazine (COMPAZINE) 10 mg Oral Tablet    Take 1 Tab (10 mg total) by mouth Four times a day as needed for Nausea/Vomiting    rOPINIRole (REQUIP) 0.5 mg Oral Tablet    Take 0.5 mg by mouth Every night        Allergies:   Allergies as of 10/22/2019 - Reviewed 10/22/2019   Allergen Reaction Noted   . Shellfish containing products Shortness of Breath, Rash, and Itching 04/11/2012   . Vancomycin Shortness of Breath 10/01/2017   . Barium iodide  04/02/2018   . Iodine and iodide containing products  07/06/2014       ROS:   Review of Systems    Filed Vitals:    10/22/19 1710 10/22/19 1758   BP: (!) 143/89    Pulse: 90 92   Resp: 20 20   Temp: 36.7 C (98 F)    SpO2: 98% 95%       Physical Exam:      Constitutional: awake, alert, no acute distress and no obvious discomfort  Head: Normocephalic and atraumatic.   Eyes: Conjunctivae are normal. Pupils are equal, round, and reactive to light.   Neck: Normal range of motion. Neck supple.   Mouth: moist mucous  membranes  Cardiovascular: Normal rate, regular rhythm. (+)S1/S2. No murmurs, gallops or rubs  Pulmonary/Chest: Clear to auscultate bilaterally, no wheezing rales or rhonchi.  Abdominal: Soft. Bowel sounds are normal. no distension. No tenderness, rebound or guarding   Upper Extremity: normal to inspection, (+) radial pulses  Lower Extremity: normal to inspection, no clubbing, cyanosis or edema. (+) pedal pulses  Neurological: alert and  oriented to person, place, and time. No focal motor or sensory deficits. Gait is normal.   Skin: Skin is warm and dry.No rash  Psychiatric:  normal mood and affect.     EKG: ED EKG was reviewed. See Tracemaster for interpretation.   Labs:   Results for orders placed or performed during the hospital encounter of 36/43/83   BASIC METABOLIC PANEL   Result Value Ref Range    SODIUM 138 135 - 145 mmol/L    POTASSIUM 4.5 3.5 - 5.0 mmol/L    CHLORIDE 106 98 - 111 mmol/L    CO2 TOTAL 26 21 - 35 mmol/L    ANION GAP 6 mmol/L    CALCIUM 8.6 (L) 8.8 - 10.3 mg/dL    GLUCOSE 104 70 - 110 mg/dL    BUN 13 10 - 25 mg/dL    CREATININE 0.70 <=1.30 mg/dL    BUN/CREA RATIO 19     ESTIMATED GFR >60 Avg: 85 mL/min/1.25m^2   B-TYPE NATRIURETIC PEPTIDE   Result Value Ref Range    BNP 69 0 - 100 pg/mL   PT/INR   Result Value Ref Range    INR 1.08 0.80 - 1.10   TSH W/ FREE T4 REFLEX   Result Value Ref Range    TSH 3.633 0.450 - 5.330 uIU/mL   TROPONIN-I   Result Value Ref Range    TROPONIN I 0.01 <=0.04 ng/mL   MAGNESIUM   Result Value Ref Range    MAGNESIUM 1.9 1.8 - 2.3 mg/dL   CBC WITH DIFF   Result Value Ref Range    WBC 6.7 3.5 - 10.3 x10^3/uL    RBC 5.03 3.80 - 5.24 x10^6/uL    HGB 11.9 11.8 - 15.8 g/dL    HCT 36.8 34.6 - 46.2 %    MCV 73.2 (L) 82.3 - 96.7 fL    MCH 23.6 (L) 27.6 - 33.2 pg    MCHC 32.2 (L) 32.6 - 35.4 g/dL    RDW 19.7 (H) 12.4 - 15.2 %    PLATELETS 307 140 - 440 x10^3/uL    MPV 7.0 6.6 - 10.2 fL    NEUTROPHIL % 70 %    LYMPHOCYTE % 15 %    MONOCYTE % 12 %    EOSINOPHIL % 2 %    BASOPHIL % 1 %    NEUTROPHIL # 4.70 1.50 - 6.40 x10^3/uL    LYMPHOCYTE # 1.00 0.90 - 3.40 x10^3/uL    MONOCYTE # 0.80 0.20 - 0.90 x10^3/uL    EOSINOPHIL # 0.20 0.00 - 0.50 x10^3/uL    BASOPHIL # 0.00 0.00 - 0.20 x10^3/uL   CREATINE KINASE (CK), TOTAL, SERUM   Result Value Ref Range    CREATINE KINASE 46 <=170 U/L   LACTIC ACID LEVEL   Result Value Ref Range    LACTIC ACID 0.9 <2.0 mmol/L   PROCALCITONIN   Result Value Ref Range    PROCALCITONIN   <0.05 <0.50 ng/mL   VENOUS BLOOD GAS   Result Value Ref Range    %FIO2 (VENOUS) 21.0 %    BICARBONATE (VENOUS) 25.8  23.0 - 33.0 mmol/L    PCO2 (VENOUS) 44.00 38.00 - 50.00 mm/Hg    PH (VENOUS) 7.40 7.32 - 7.45    PO2 (VENOUS) 38.0 30.0 - 50.0 mm/Hg    BASE EXCESS 2.0 -2.0 - 3.0 mmol/L    O2 SATURATION (VENOUS) 62.7 (L) 95.0 - 98.0 %   COVID-19 SCREENING INCLUDING FLU A/B, RSV RAPID BY PCR   Result Value Ref Range    SARS-CoV-2 Not Detected Not Detected    INFLUENZA VIRUS TYPE A Not Detected Not Detected    INFLUENZA VIRUS TYPE B Not Detected Not Detected    RESPIRATORY SYNCTIAL VIRUS (RSV) Not Detected Not Detected   ECG 12 LEAD - ED USE   Result Value Ref Range    Heart Rate 88 BPM    PR Interval 148 ms    QRS Duration 85 ms    QT Interval 369 ms    QTC Calculation 447 ms    Calculated P Axis 52 deg    QRS Axis 51 deg    Calculated T Axis 54 deg    I 40 Axis 33 deg    T 40 Axis 69 deg    ST Axis 67 deg    EKG Severity - NORMAL ECG -      Radiology:   Results for orders placed or performed during the hospital encounter of 10/22/19   XR AP MOBILE CHEST     Status: None    Narrative    Veleria Schappell    PROCEDURE DESCRIPTION: XR AP MOBILE CHEST    CLINICAL INDICATION: Chest pain    TECHNIQUE: 1 views / 1 images submitted.    COMPARISON: March 11, 2019      FINDINGS: AP portable upright chest:    Right-sided chemotherapy infusion port is in place with its tip near the  cavoatrial notch. Prominence of the right hilum noted. Moderate blunting  right CP angle region noted consistent with small to moderate-sized  effusion. Some subtle increased markings left perihilar region noted.      Impression    1. Soft tissue prominence right perihilar soft tissues similar to March 11, 2019.  2. Development right-sided pleural effusion.  3. Possible left perihilar infiltrate on today's study. Follow-up  recommended.      DRD          Radiologist location ID: [id number]         Impression/Plan  Right-sided pleural effusion possible  infiltrate  Community-acquired pneumonia  CAD  History of lung cancer and colon cancer  Hyperlipidemia  Asthma  That hypothyroid  Insomnia/restless leg syndrome  GERD        After discussing medical case with the patient she decided she wanted to go home  She was instructed to come back if her symptoms return  Patient was encouraged to stay but fell adamant that she wanted to receive antibiotics from home  Discussed with ED physician, ED physician discharge patient home with appropriate antibiotics       Franchot Erichsen, MD

## 2019-10-22 NOTE — ED Nurses Note (Signed)
Report called to 4N RN.

## 2019-10-22 NOTE — Discharge Instructions (Signed)
Please start a daily probiotic while taking antibiotics to help prevent C diff (Clostridium difficile) infection.  You have an increased risk of developing Clostridium difficile infection while taking antibiotics.      Clostridium difficile is a bacertial infection which develops in your intestines/colon.  You can eat yogurt (some yogurts have probiotics) as well as take a daily probiotic to help prevent this infection.  You can find a daily probiotic at your local pharmacy.  Please ask the pharmacist to help you if you need assistance.    Signs and symptoms of Clostridium difficile infection include fever, nausea, vomiting, diarrhea and abdominal pain.     Please seek medical care if you develop any of these symptoms.

## 2019-10-22 NOTE — ED Nurses Note (Signed)
Pt. Decides she wants to leave AMA after consult with dr. Albertina Senegal. Dr. Coralee North notified. Agricultural consultant notified.

## 2019-10-22 NOTE — ED Nurses Note (Signed)
Attempted to call report to [place]. Charge nurse notified.

## 2019-10-22 NOTE — ED Attending Note (Signed)
I was physically present and directly supervised this patient's care on 10/22/2019.  The patient was seen and examined by me. The resident, Dr. Jeronimo Norma history and exam were reviewed by me.  Key elements in addition to and/or correction of that documentation are as follows:    Chief Complaint   Patient presents with   . Chest Pain      Pt presents to ED with c/o midsternal chest pain and shortness of breath x 6 days. Hx of A Fib.      History of Present Illness  Kelsey [name] is a 69 y.o. female accompanied her husband presents with "I have heaviness in my chest and it is hard to breathe" for about a month.  The patient called her doctor and told her to come to the emergency department for further workup.  She describes the chest pain as mid chest in location, constant in duration, and heaviness in character.  The chest pain is worse with exertion, nothing makes the chest pain better.  She also describes exertional shortness of breath.  She has a chronic cough.  She denies coughing blood.  No associated fever.  Associated nausea without vomiting.  She also describes indigestion, she has had indigestion for a long time.  She is requesting Prilosec.  She denies any calf pain.  No significant leg swelling.  No episodes of sweating.    The patient has a history of atrial fibrillation and follows with Dr. Theodoro Kos.  She has a history of lung cancer and follows with Dr. Deatra Canter, she is currently undergoing immunotherapy.  She denies any significant coronary artery disease.    No supplemental oxygen use.  No CPAP or BIPAP use.    Past Medical History:  Past Medical History:   Diagnosis Date   . A-fib (CMS HCC)    . Arthropathy, unspecified, site unspecified    . Asthma    . Cancer (CMS HCC)     cervical   . Cataract    . Colon cancer (CMS [facility]) 07/06/2014   . COPD (chronic obstructive pulmonary disease) (CMS HCC)    . Fistula    . HTN (hypertension)    . Hyperlipidemia    . Lung mass     with pleural effusion   . Obesity       . Sleep apnea    . Ventral hernia      Past Surgical History:  Past Surgical History:   Procedure Laterality Date   . Abscess drainage     . Bowel resection     . Cataract extraction     . Hx cervical cone biopsy      . Hx cesarean section     . Hx gastric bypass     . Hx hernia repair     . Hx hysterectomy     . Hx lap banding     . Hx laparotomy       Social History:  Social History     Tobacco Use   . Smoking status: Former Research scientist (life sciences)   . Smokeless tobacco: Never Used   . Tobacco comment: quit 10 yrs ago   Substance Use Topics   . Alcohol use: Not Currently     Comment: rarely   . Drug use: No     Social History     Substance and Sexual Activity   Drug Use No     Family History:  Family History   Problem Relation Age of Onset   .  Diabetes Mother    . Heart Attack Mother    . Diabetes Maternal Grandmother    . Diabetes Maternal Grandfather    . Diabetes Father    . Stroke Father        Medications Prior to Admission     Prescriptions    albuterol sulfate (PROVENTIL OR VENTOLIN OR PROAIR) 90 mcg/actuation Inhalation HFA Aerosol Inhaler    INHALE 1 TO 2 PUFFS BY MOUTH EVERY 6 HOURS AS NEEDED    albuterol sulfate (PROVENTIL) 2.5 mg /3 mL (0.083 %) Inhalation Solution for Nebulization    3 mL (2.5 mg total) by Nebulization route Every 6 hours    apixaban (ELIQUIS) 5 mg Oral Tablet    Take 1 Tab (5 mg total) by mouth Twice daily    atorvastatin (LIPITOR) 10 mg Oral Tablet    Take 10 mg by mouth Once a day    budesonide-formoteroL (SYMBICORT) 160-4.5 mcg/actuation Inhalation HFA Aerosol Inhaler    Take 2 Puffs by inhalation Twice daily    carvediloL (COREG) 3.125 mg Oral Tablet    Take 3.125 mg by mouth Twice daily with food    docusate sodium (COLACE) 100 mg Oral Capsule    Take 100 mg by mouth Three times a day as needed     flecainide (TAMBOCOR) 100 mg Oral Tablet    Take 1 Tab (100 mg total) by mouth Twice daily    fluticasone propionate (FLONASE) 50 mcg/actuation Nasal Spray, Suspension    1 Spray by Each Nostril  route Once a day    levothyroxine (SYNTHROID) 112 mcg Oral Tablet    Take 1 Tab (112 mcg total) by mouth Every morning    levothyroxine (SYNTHROID) 50 mcg Oral Tablet    Take 1 Tab (50 mcg total) by mouth Every morning    linaCLOtide (LINZESS) 72 mcg Oral Capsule    Take 72 mcg by mouth Every morning    lisinopriL (PRINIVIL) 20 mg Oral Tablet    Take 20 mg by mouth Once a day    loperamide (IMODIUM) 2 mg Oral Capsule    Take 2 mg by mouth Every 4 hours as needed    loratadine (CLARITIN) 10 mg Oral Tablet    Take 1 Tab (10 mg total) by mouth Once a day    magnesium oxide (MAG-OX) 400 mg Oral Tablet    Take 400 mg by mouth Once a day    omeprazole (PRILOSEC) 20 mg Oral Capsule, Delayed Release(E.C.)    Take 40 mg by mouth Once a day     oxyCODONE (ROXICODONE) 5 mg Oral Tablet    Take 1 Tablet (5 mg total) by mouth Every 6 hours as needed for Pain    potassium chloride (K-DUR) 20 mEq Oral Tab Sust.Rel. Particle/Crystal    Take 20 mEq by mouth Once a day Takes 3 days a week    prochlorperazine (COMPAZINE) 10 mg Oral Tablet    Take 1 Tab (10 mg total) by mouth Four times a day as needed for Nausea/Vomiting    rOPINIRole (REQUIP) 0.5 mg Oral Tablet    Take 0.5 mg by mouth Every night        Above history reviewed.    Physical Examination   Nursing notes reviewed.  Vitals reviewed.  Vitals Recorded in This Encounter       10/22/2019  1710 10/22/2019  1758          BP:  (!) 143/89  -  Pulse:  90  92      Resp:  20  20      Temp:  36.7 C (98 F)  -      SpO2:  98 %  95 %        Constitutional: Patient appears in no acute distress. Alert and oriented  HENT:   Head-Normocephalic   Mouth/Throat-Mucous membranes are moist  Eyes-Pupils equal, round, reactive to light.  Conjunctivae normal bilaterally  Neck-Supple.  Normal range of motion  Cardiovascular: Regular rate and rhythm  Pulmonary/Chest: No respiratory distress.  Breath sounds equal bilaterally.  No wheezes, rales or rhonchi    Abdomen:  Abdomen soft.  No tenderness,  rebound, guarding or masses present   Musculoskeletal: Normal range of motion, no tenderness or gross deformity  Lower extremities: No bilateral pitting edema, no unilateral leg swelling or calf pain  Skin: Warm and dry.  No rash, erythema, pallor, cyanosis or jaundice   Neurological:  Alert and oriented.  CN II-XII grossly intact  She moves all extremities  Psychiatric: Normal mood and affect.  Behavior is normal.  Judgment and thought content normal    Laboratory Studies  Results for orders placed or performed during the hospital encounter of 10/22/19 (from the past 12 hour(s))   BASIC METABOLIC PANEL   Result Value Ref Range    SODIUM 138 135 - 145 mmol/L    POTASSIUM 4.5 3.5 - 5.0 mmol/L    CHLORIDE 106 98 - 111 mmol/L    CO2 TOTAL 26 21 - 35 mmol/L    ANION GAP 6 mmol/L    CALCIUM 8.6 (L) 8.8 - 10.3 mg/dL    GLUCOSE 104 70 - 110 mg/dL    BUN 13 10 - 25 mg/dL    CREATININE 0.70 <=1.30 mg/dL    BUN/CREA RATIO 19     ESTIMATED GFR >60 Avg: 85 mL/min/1.41m^2   B-TYPE NATRIURETIC PEPTIDE   Result Value Ref Range    BNP 69 0 - 100 pg/mL   PT/INR   Result Value Ref Range    INR 1.08 0.80 - 1.10   TROPONIN-I   Result Value Ref Range    TROPONIN I 0.01 <=0.04 ng/mL   MAGNESIUM   Result Value Ref Range    MAGNESIUM 1.9 1.8 - 2.3 mg/dL   CBC WITH DIFF   Result Value Ref Range    WBC 6.7 3.5 - 10.3 x10^3/uL    RBC 5.03 3.80 - 5.24 x10^6/uL    HGB 11.9 11.8 - 15.8 g/dL    HCT 36.8 34.6 - 46.2 %    MCV 73.2 (L) 82.3 - 96.7 fL    MCH 23.6 (L) 27.6 - 33.2 pg    MCHC 32.2 (L) 32.6 - 35.4 g/dL    RDW 19.7 (H) 12.4 - 15.2 %    PLATELETS 307 140 - 440 x10^3/uL    MPV 7.0 6.6 - 10.2 fL    NEUTROPHIL % 70 %    LYMPHOCYTE % 15 %    MONOCYTE % 12 %    EOSINOPHIL % 2 %    BASOPHIL % 1 %    NEUTROPHIL # 4.70 1.50 - 6.40 x10^3/uL    LYMPHOCYTE # 1.00 0.90 - 3.40 x10^3/uL    MONOCYTE # 0.80 0.20 - 0.90 x10^3/uL    EOSINOPHIL # 0.20 0.00 - 0.50 x10^3/uL    BASOPHIL # 0.00 0.00 - 0.20 x10^3/uL   CREATINE KINASE (CK), TOTAL, SERUM   Result  Value Ref  Range    CREATINE KINASE 46 <=170 U/L   PROCALCITONIN   Result Value Ref Range    PROCALCITONIN  <0.05 <0.50 ng/mL   TSH W/ FREE T4 REFLEX   Result Value Ref Range    TSH 3.633 0.450 - 5.330 uIU/mL   LACTIC ACID LEVEL   Result Value Ref Range    LACTIC ACID 0.9 <2.0 mmol/L   VENOUS BLOOD GAS   Result Value Ref Range    %FIO2 (VENOUS) 21.0 %    BICARBONATE (VENOUS) 25.8 23.0 - 33.0 mmol/L    PCO2 (VENOUS) 44.00 38.00 - 50.00 mm/Hg    PH (VENOUS) 7.40 7.32 - 7.45    PO2 (VENOUS) 38.0 30.0 - 50.0 mm/Hg    BASE EXCESS 2.0 -2.0 - 3.0 mmol/L    O2 SATURATION (VENOUS) 62.7 (L) 95.0 - 98.0 %   COVID-19 SCREENING INCLUDING FLU A/B, RSV RAPID BY PCR   Result Value Ref Range    SARS-CoV-2 Not Detected Not Detected    INFLUENZA VIRUS TYPE A Not Detected Not Detected    INFLUENZA VIRUS TYPE B Not Detected Not Detected    RESPIRATORY SYNCTIAL VIRUS (RSV) Not Detected Not Detected     Imaging Studies  XR AP MOBILE CHEST   Final Result by Edi, Radresults In (03/04 1810)   1. Soft tissue prominence right perihilar soft tissues similar to March 11, 2019.   2. Development right-sided pleural effusion.   3. Possible left perihilar infiltrate on today's study. Follow-up   recommended.         DRD               Radiologist location ID: [id number]           ECG:  Please refer to trace master for reading, compared to ECG on 03/11/2019.  Sinus rhythm, rate 88. No significant acute ST elevation    No significant dysrhythmias during emergency department telemetry monitoring    Previous medical records reviewed.    All labs and imaging reports reviewed.    Medical intervention during emergency department stay:  Medications   nitroGLYCERIN (NITROSTAT) sublingual tablet (0.4 mg Sublingual Given 10/22/19 1843)   cefTRIAXone (ROCEPHIN) 1 g in NS 50 mL IVPB (0 g Intravenous Stopped 10/22/19 1924)   predniSONE (DELTASONE) tablet 50 mg (50 mg Oral Given 10/22/19 1947)   diphenhydrAMINE (BENADRYL) capsule (has no administration in time range)    aspirin chewable tablet 324 mg (324 mg Oral Given 10/22/19 1843)   azithromycin (ZITHROMAX) 500 mg in NS 250 mL IVPB (500 mg Intravenous New Bag/New Syringe 10/22/19 1924)   famotidine (PEPCID) tablet (20 mg Oral Given 10/22/19 1948)     Medical Decision Making/ED Course  Patient underwent-ECG, chest x-ray, blood work  Patient received-matted team 20 mg by mouth ceftriaxone 1 g IV, azithromycin 500 mg by mouth, aspirin 324 mg by mouth, nitroglycerin 0.4 mg  Results reviewed.   ED Course as of Oct 21 2125   Thu Oct 22, 2019   2124 All results were discussed with the patient and patient's husband.  I discussed my recommendation for admission for serial cardiac enzymes, telemetry, observation and potential further cardiac testing (CT imaging of her chest and receiving pretreatment for dye allergy).     The patient initially consented to admission, hospitalist was contacted and bed request was placed.  The patient was evaluated by Dr. Albertina Senegal and I was informed by Dr. Albertina Senegal the patient had changed her mind.  When  addressing this with the patient, she stated that she just wanted to sleep in her own bed.    The patient voiced understanding of incomplete cardiopulmonary work up performed while in the emergency department and the risks associated with missed diagnosis of heart attack, pulmonary embolism, arrhythmia, etc.      Patient declined admission.  Patient capable of medical decision making.  The patient's husband present and agreed       [MM]      ED Course User Index  [MM] Malisha Mabey E, DO     Please refer to Dr. Jeronimo Norma documentation for further details MDM/ED course.    69 year old female presenting with chest pain with history lung cancer.  Findings concerning for potential pneumonia-blood cultures obtained antibiotic coverage with ceftriaxone and azithromycin secondary to patient's antibiotic allergies.  Unable to proceed with CT imaging for potential pulmonary embolism secondary to patient's IV dye  allergy.  Plan of care-admission, serial troponin telemetry, pretreatment for CT imaging pulmonary embolism protocol further workup/treatment per hospitalist    The plan of care and all results were discussed with the patient and the patient's husband.  The patient and the patient's husband were given the opportunity to ask questions and all questions were addressed and answered.  The patient voiced understanding and comfort with plan of care.  The patient was capable of medical decision making.    [facility]  On-call hospitalist    Impressions   Encounter Diagnoses   Name Primary?   . Pleural effusion Yes   . Chest pain of uncertain etiology       Disposition  Against medical advice  Informational handouts given to the patientat discharge.  The patient is to resume home diet, as tolerated.   Gradually increase activity, as tolerated.  Prescriptions were written for Augmentin.  Reasoning for prescription and over-the-counter medications, potential side effects, and medication instructions were discussed with the patient prior to discharge.    Prescriptions   Current Discharge Medication List      START taking these medications    Details   amoxicillin-pot clavulanate (AUGMENTIN) 875-125 mg Oral Tablet Take 1 Tablet by mouth Twice daily for 10 days  Qty: 20 Tablet, Refills: 0           Follow Up   [facility], [place]  [address]  [place] [address]  [phone]    Schedule an appointment as soon as possible for a visit       [facility] - Emergency Department  [place] [address]  [phone]    As needed    Future Appointments   Date Time Provider [place]   10/29/2019  9:30 AM Herbie Drape, NP [facility] [place], DO 10/22/2019, 21:27  Emergency Medicine Attending    This chart may have been completed after the conclusion of this patient's care due to the time constraints of simultaneous responsiltibites of  direct patient care activities during the clinical shift in the emergency department.   This note was partially generated using voice recognition hardware which may lead to some grammatical and syntax errors.

## 2019-10-22 NOTE — ED Nurses Note (Signed)
Discharged to home with instructions and all belongings. No distress noted. Pt. Alert and orientated x3. Encouraged to return to the ed for any further complaints. Voices understanding.

## 2019-10-23 LAB — ECG 12 LEAD - ED USE
Calculated P Axis: 52 deg
Calculated T Axis: 54 deg
EKG Severity: NORMAL
Heart Rate: 88 {beats}/min
I 40 Axis: 33 deg
PR Interval: 148 ms
QRS Axis: 51 deg
QRS Duration: 85 ms
QT Interval: 369 ms
QTC Calculation: 447 ms
ST Axis: 67 deg
T 40 Axis: 69 deg

## 2019-10-26 NOTE — H&P (Signed)
PATIENT NAME: Lamar NUMBER:  [id number]  DATE OF SERVICE: 10/01/2017  DATE OF BIRTH:  06-Jan-1951    HISTORY AND PHYSICAL    CHIEF COMPLAINT:  This 69 year old white female presented to my office with complaints of a decrease in vision in both eyes.    PAST MEDICAL HISTORY:  Illnesses:   1.          Hypertension.    2.          Hyperlipidemia.   3.          Gastroesophageal reflux disease.    MEDICATIONS:  1.          Lipitor 10 mg daily.    2.          Symbicort 160-4.5 mcg per actuation 2 puffs b.i.d.   3.          Cardizem CD 120 mg daily.    4.          Colace 100 mg t.i.d.    5.          Hydrochlorothiazide 25 mg daily.    6.          Metoprolol 25 mg b.i.d.    7.          Prilosec 20 mg daily.    8.          Potassium chloride 20 mEq daily.    9.          Compazine 10 mg q.i.d.   10.        Desyrel 100 mg daily.    11.        Vitamin B complex unknown frequency.    12.        Vitamin E 400 units daily.    ALLERGIES:  1.          SHELLFISH PRODUCTS.    2.          IODINE.    REVIEW OF SYSTEMS:  1.          Colon cancer by history.   2.          Arthritis.    SOCIAL HISTORY:  The patient lives with a family member and denies use of alcohol and/or tobacco.    PHYSICAL EXAMINATION:  My most recent eye exam showed a visual acuity, without glasses, of 20/60 on the right, and 20/50 on the left     PRESENT GLASSES:   None.     MANIFEST REFRACTION:   Right eye: -0.25 sphere for a best corrected vision of 20/60.   Left eye: -0.25 sphere for a best corrected vision of 20/50.     INTRAOCULAR PRESSURE:  Soft to digital palpation in each eye.     EXTERNAL:  Normal in both eyes.     VISUAL FIELDS:  Full to finger count in each eye.      PUPILS:  Equal, round, and reactive with no afferent pupillary defect.     MOTILITY:  Orthophoric (straight) with a full range of motion.     ANTERIOR SEGMENT EXAMINATION:  Normal in both eyes with a grade 5 to 6/10 nuclear sclerotic cataract in each eye.       FUNDUS:  Dilated fundus exam clear vitreous with a healthy-appearing optic nerve head, cup-to-disc ratio of 0.1 in each eye with normal vessels, macula, and periphery in both eyes.    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    PLAN:  Proceed with cataract surgery in the right eye at the [facility], on October 01, 2017. with a 23.5 diopter [id number] posterior chamber intraocular lens implant in the right eye.    PER ASSESSMENT:  1.          The patient's impairment of visual function is believed not to be correctable with a tolerable change in glasses or contact lenses.  2.          As such, the patient desires surgical correction.  3.          The risks, benefits and alternatives have been explained to the patient in detail, and reasonable expectation exists that lens surgery will significantly improve both the visual and functional status of the patient.  4.          If other ocular diseases are present, the cataract is the primary reason for this surgery.        Charlyne Mom, MD    Addendum:  The decreased vision is from the cataract, and is associated with difficulty reading books, reading medicine labels, watching television, driving at night and reading in dim light.      Otelia Sergeant, MD      DD:  09/30/2017 21:41:27  DT:  09/30/2017 22:29:47 ES  D#:  [id number]

## 2019-10-27 LAB — ADULT ROUTINE BLOOD CULTURE, SET OF 2 BOTTLES (BACTERIA AND YEAST)
BLOOD CULTURE, ROUTINE: NO GROWTH
BLOOD CULTURE, ROUTINE: NO GROWTH

## 2019-10-29 ENCOUNTER — Ambulatory Visit
Admission: RE | Admit: 2019-10-29 | Discharge: 2019-10-29 | Disposition: A | Discharge status: Home / Self Care | Payer: Medicare Other | Source: Ambulatory Visit | Attending: Internal Medicine | Admitting: Internal Medicine

## 2019-10-29 ENCOUNTER — Encounter (HOSPITAL_COMMUNITY): Payer: Self-pay | Admitting: Nurse Practitioner

## 2019-10-29 ENCOUNTER — Ambulatory Visit (HOSPITAL_COMMUNITY)
Admission: RE | Admit: 2019-10-29 | Discharge: 2019-10-29 | Disposition: A | Discharge status: Home / Self Care | Payer: Medicare Other | Source: Ambulatory Visit | Attending: Nurse Practitioner | Admitting: Nurse Practitioner

## 2019-10-29 ENCOUNTER — Other Ambulatory Visit: Payer: Self-pay

## 2019-10-29 ENCOUNTER — Ambulatory Visit (HOSPITAL_BASED_OUTPATIENT_CLINIC_OR_DEPARTMENT_OTHER): Payer: Medicare Other | Admitting: Nurse Practitioner

## 2019-10-29 ENCOUNTER — Inpatient Hospital Stay (HOSPITAL_COMMUNITY): Payer: Medicare Other

## 2019-10-29 VITALS — BP 118/58 | HR 79 | Temp 96.8°F | Resp 16 | Ht 67.0 in | Wt 270.0 lb

## 2019-10-29 DIAGNOSIS — R0609 Other forms of dyspnea: Secondary | ICD-10-CM

## 2019-10-29 DIAGNOSIS — C3490 Malignant neoplasm of unspecified part of unspecified bronchus or lung: Secondary | ICD-10-CM

## 2019-10-29 DIAGNOSIS — T451X5A Adverse effect of antineoplastic and immunosuppressive drugs, initial encounter: Secondary | ICD-10-CM

## 2019-10-29 DIAGNOSIS — G893 Neoplasm related pain (acute) (chronic): Secondary | ICD-10-CM

## 2019-10-29 DIAGNOSIS — J189 Pneumonia, unspecified organism: Secondary | ICD-10-CM

## 2019-10-29 DIAGNOSIS — Z7951 Long term (current) use of inhaled steroids: Secondary | ICD-10-CM | POA: Insufficient documentation

## 2019-10-29 DIAGNOSIS — Z9114 Patient's other noncompliance with medication regimen: Secondary | ICD-10-CM | POA: Insufficient documentation

## 2019-10-29 DIAGNOSIS — R112 Nausea with vomiting, unspecified: Secondary | ICD-10-CM

## 2019-10-29 DIAGNOSIS — C3491 Malignant neoplasm of unspecified part of right bronchus or lung: Secondary | ICD-10-CM

## 2019-10-29 DIAGNOSIS — R946 Abnormal results of thyroid function studies: Secondary | ICD-10-CM

## 2019-10-29 DIAGNOSIS — J90 Pleural effusion, not elsewhere classified: Secondary | ICD-10-CM

## 2019-10-29 DIAGNOSIS — Z5111 Encounter for antineoplastic chemotherapy: Secondary | ICD-10-CM

## 2019-10-29 DIAGNOSIS — R0600 Dyspnea, unspecified: Secondary | ICD-10-CM

## 2019-10-29 DIAGNOSIS — Z7952 Long term (current) use of systemic steroids: Secondary | ICD-10-CM | POA: Insufficient documentation

## 2019-10-29 DIAGNOSIS — Z79899 Other long term (current) drug therapy: Secondary | ICD-10-CM | POA: Insufficient documentation

## 2019-10-29 DIAGNOSIS — E039 Hypothyroidism, unspecified: Secondary | ICD-10-CM

## 2019-10-29 DIAGNOSIS — T451X5S Adverse effect of antineoplastic and immunosuppressive drugs, sequela: Secondary | ICD-10-CM

## 2019-10-29 DIAGNOSIS — Z9119 Patient's noncompliance with other medical treatment and regimen: Secondary | ICD-10-CM | POA: Insufficient documentation

## 2019-10-29 DIAGNOSIS — Z7901 Long term (current) use of anticoagulants: Secondary | ICD-10-CM | POA: Insufficient documentation

## 2019-10-29 DIAGNOSIS — D649 Anemia, unspecified: Secondary | ICD-10-CM

## 2019-10-29 DIAGNOSIS — R7401 Elevation of levels of liver transaminase levels: Secondary | ICD-10-CM

## 2019-10-29 DIAGNOSIS — R918 Other nonspecific abnormal finding of lung field: Secondary | ICD-10-CM | POA: Insufficient documentation

## 2019-10-29 DIAGNOSIS — R7989 Other specified abnormal findings of blood chemistry: Secondary | ICD-10-CM

## 2019-10-29 DIAGNOSIS — Z87891 Personal history of nicotine dependence: Secondary | ICD-10-CM | POA: Insufficient documentation

## 2019-10-29 LAB — COMPREHENSIVE METABOLIC PANEL, NON-FASTING
ALBUMIN: 3.5 g/dL (ref 3.2–4.6)
ALKALINE PHOSPHATASE: 72 U/L (ref 20–130)
ALT (SGPT): 35 U/L (ref <=52)
ANION GAP: 7 mmol/L
AST (SGOT): 37 U/L — ABNORMAL HIGH (ref <=35)
BILIRUBIN TOTAL: 0.6 mg/dL (ref 0.3–1.2)
BUN/CREA RATIO: 14
BUN: 12 mg/dL (ref 10–25)
CALCIUM: 8.6 mg/dL — ABNORMAL LOW (ref 8.8–10.3)
CHLORIDE: 104 mmol/L (ref 98–111)
CO2 TOTAL: 25 mmol/L (ref 21–35)
CREATININE: 0.88 mg/dL (ref <=1.30)
ESTIMATED GFR: >60 mL/min/{1.73_m2}
GLUCOSE: 133 mg/dL — ABNORMAL HIGH (ref 70–110)
POTASSIUM: 4.3 mmol/L (ref 3.5–5.0)
PROTEIN TOTAL: 6 g/dL (ref 6.0–8.3)
SODIUM: 136 mmol/L (ref 135–145)

## 2019-10-29 LAB — CBC WITH DIFF
BASOPHIL #: 0.1 10*3/uL (ref 0.00–0.20)
BASOPHIL %: 1 %
EOSINOPHIL #: 0.2 10*3/uL (ref 0.00–0.50)
EOSINOPHIL %: 3 %
HCT: 36.7 % (ref 34.6–46.2)
HGB: 11.9 g/dL (ref 11.8–15.8)
LYMPHOCYTE #: 0.7 10*3/uL — ABNORMAL LOW (ref 0.90–3.40)
LYMPHOCYTE %: 11 %
MCH: 23.9 pg — ABNORMAL LOW (ref 27.6–33.2)
MCHC: 32.6 g/dL (ref 32.6–35.4)
MCV: 73.5 fL — ABNORMAL LOW (ref 82.3–96.7)
MONOCYTE #: 0.9 10*3/uL (ref 0.20–0.90)
MONOCYTE %: 13 %
MPV: 7.4 fL (ref 6.6–10.2)
NEUTROPHIL #: 5 10*3/uL (ref 1.50–6.40)
NEUTROPHIL %: 72 %
PLATELETS: 286 10*3/uL (ref 140–440)
RBC: 4.99 10*6/uL (ref 3.80–5.24)
RDW: 19.8 % — ABNORMAL HIGH (ref 12.4–15.2)
WBC: 6.9 10*3/uL (ref 3.5–10.3)

## 2019-10-29 LAB — THYROID STIMULATING HORMONE (SENSITIVE TSH): TSH: 3.752 u[IU]/mL (ref 0.450–5.330)

## 2019-10-29 MED ORDER — PREDNISONE 20 MG TABLET
40.0000 mg | ORAL_TABLET | Freq: Every day | ORAL | 0 refills | Status: AC
Start: 2019-10-29 — End: 2019-11-01

## 2019-10-29 MED ORDER — AZITHROMYCIN 250 MG TABLET
ORAL_TABLET | ORAL | 0 refills | Status: DC
Start: 2019-10-29 — End: 2019-11-02

## 2019-10-29 NOTE — Progress Notes (Signed)
[name] [address]        Encounter Date: 10/29/2019   9:30 AM EST    Name:  Kelsey [name]  Age: 69 y.o.  DOB: 10/09/1950  Sex: female  PCP: Benjie Karvonen    Chief Complaint:    Chief Complaint   Patient presents with    Treatment     HISTORY OF PRESENT ILLNESS:   69 y.o. female with history of stage III colon cancer of present evaluation and management of lung cancer.    1. September 2015. Patient underwent sigmoidoscopy and was found to have polyp which was adenocarcinoma.    2. June 10, 2014. She was admitted for segmental resection of sigmoid colon. This revealed pT3, N1a, MX, low-grade  adenocarcinoma of the colon. The mass measuring 4 x 3 x 0.5 centimeters. It  was low-grade and there was no evidence of microsatellite instability by  histology. Margins were uninvolved and 4 lymph nodes only were removed which  only 1 was involved. This corresponding stage IIIB, T3, N1a, M0, low-grade  sigmoid cancer.     3. July 07, 2014. Recommended adjuvant chemotherapy with FOLFOX.    4. July 27, 2014. Patient was started on adjuvant chemotherapy with FOLFOX.  Patient received only 5 treatments and chemotherapy was stopped due to poor tolerance.  After that patient lost follow-up.      5. October 2019. Patient was admitted to hospital with concern for  bowel obstruction.  CT chest showed lung nodules.  Patient was managed conservatively and discharged home.    6. May 06, 2018.  Patient underwent CT-guided biopsy of lung lesion.  Pathology showed well-differentiated adenocarcinoma.  Section shows well-differentiated adenocarcinoma which positive for CK 7 and TTF 1, negative for P 63 and CK 20.     7. June 23, 2018. PET scan showed Hypermetabolic right lower lobe pulmonary mass compatible with malignancy.  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 Hepatomegaly and  steatosis.     8. July 23, 2018. Patient was started on chemoradiation.    9. October 30, 2018. PET scan showed Hypermetabolic right lower lobe mass with abnormal mediastinal and hilar nodes. There has been only mild improvement since earlier exam.     10. October 31, 2018. Patient was started on maintenance immunotherapy with durvalumab.    11. February 17, 2019. CT chest revealed Moderate right effusion new from 12/19/2018. No change in the right lower lung mass or a presumed postradiation changes in the right upper lung when compared to 12/19/2018. Mass in the right cardiophrenic angle increased from 10/30/2019 6). Consider an enlarged lymph node here. This is unchanged from 12/19/2018 but increased since size from 10/30/2018. Another possibility is an enlarging pericardial cyst (this region was not hypermetabolic 12/03/8307). Mild enlargement of the left adrenal gland stable from 10/30/2018. Approximate 9 cm upper abdominal ventral hernia containing nonobstructed bowel loops unchanged    12. March 06, 2019. PET CT revealed 1. The RIGHT lower lobe mass has decreased in size and uptake, which is consistent with response to therapy. However, abnormal uptake remains. This is consistent with residual tumor. Suspect post radiation change in the RIGHT pneumothorax. Consolidation in the medial  RIGHT lung has tumor level uptake. Differential: Post radiation change, infection, and tumor infiltration. Additional findings are concerning for tumor infiltration. Follow-up recommended. Increased RIGHT hilar density and uptake. Concerning for tumor spread. Post radiation change less likely. 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 A low-attenuation mass in the RIGHT cardiophrenic angle may be related. Lymph node metastasis less likely.    13. October 29, 2019. CT chest revealed 1. Persistent dense consolidation in the RIGHT middle lobe. Compatible with post radiation change. Residual tumor cannot be excluded. 2. Scattered groundglass opacities  in both lungs. Favor post radiation change or infiltrate or tumor. Follow-up recommended. 3. A 5 mm nodule in the LEFT lingula is concerning for metastatic disease. Follow-up is recommended. PET/CT could be useful. Alternatively, tissue biopsy.      SUBJECTIVE:  Patient presents for follow-up and treatment. Patient report she developed shortness of breaht and went to the ER. Patient was started on Augmentin. Patient denies any new ha, blurred vision, dizziness. Patient denies any other immune related symptoms. Patient reports extreme fatigue and just not feeling well.     REVIEW OF SYSTEMS:  GENERAL:   Denies fever or chills, reports fatigue   HEENT: no sore throat, congestion, blurry vision  Lungs:  Reports dyspnea especially with exertion  GI: no nausea, vomiting, constipation, no diarrhea  GU: No dysuria, urgency, increased frequency   Cardiac:  Denies any chest pain, palpitations, syncope,   Neuro: no weakness, loss of function, numbness, tingling  Skin: no rash  Musculoskeletal: No myalgias  Psychosocial: No depression, anxiety  Hematologic: No easy bruising, abnormal bleeding  All other review ROS negative except those mentioned above.     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     HTN (hypertension)     Hyperlipidemia     Lung mass     with pleural effusion    Obesity     Sleep apnea     Ventral hernia        Past Surgical History:   Procedure Laterality Date    ABSCESS DRAINAGE      BOWEL RESECTION      CATARACT EXTRACTION      HX CERVICAL CONE BIOPSY       HX CESAREAN SECTION      HX GASTRIC BYPASS      25 years ago    [facility] (x2)    HX HYSTERECTOMY      late 32s    HX LAP BANDING      2013    HX LAPAROTOMY      2013 to remove lap band        Family Medical History:       Problem Relation (Age of Onset)    Diabetes Mother, Maternal Grandmother, Maternal Grandfather, Father    Heart Attack Mother    Stroke Father            Current Outpatient Medications   Medication Sig    albuterol sulfate (PROVENTIL OR VENTOLIN OR PROAIR) 90 mcg/actuation Inhalation HFA Aerosol Inhaler INHALE 1 TO 2 PUFFS BY MOUTH  EVERY 6 HOURS AS NEEDED    albuterol sulfate (PROVENTIL) 2.5 mg /3 mL (0.083 %) Inhalation Solution for Nebulization 3 mL (2.5 mg total) by Nebulization route Every 6 hours    amoxicillin-pot clavulanate (AUGMENTIN) 875-125 mg Oral Tablet Take 1 Tablet by mouth Twice daily for 10 days    apixaban (ELIQUIS) 5 mg Oral Tablet Take 1 Tab (5 mg total) by mouth Twice daily    atorvastatin (LIPITOR) 10 mg Oral Tablet Take 10 mg by mouth Once a day    azithromycin (ZITHROMAX) 250 mg Oral Tablet Take 500 mg (2 tab) on day 1; take 250 mg (1 tab) on days 2-5.    budesonide-formoteroL (SYMBICORT) 160-4.5 mcg/actuation Inhalation HFA Aerosol Inhaler Take 2 Puffs by inhalation Twice daily    carvediloL (COREG) 3.125 mg Oral Tablet Take 3.125 mg by mouth Twice daily with food    docusate sodium (COLACE) 100 mg Oral Capsule Take 100 mg by mouth Three times a day as needed     flecainide (TAMBOCOR) 100 mg Oral Tablet Take 1 Tab (100 mg total) by mouth Twice daily    fluticasone propionate (FLONASE) 50 mcg/actuation Nasal Spray, Suspension 1 Spray by Each Nostril route Once a day    levothyroxine (SYNTHROID) 112 mcg Oral Tablet Take 1 Tab (112 mcg total) by mouth Every morning    levothyroxine (SYNTHROID) 50 mcg Oral Tablet Take 1 Tab (50 mcg total) by mouth Every morning    linaCLOtide (LINZESS) 72 mcg Oral Capsule Take 72 mcg by mouth Every morning    lisinopriL (PRINIVIL) 20 mg Oral Tablet Take 20 mg by mouth Once a day    loperamide (IMODIUM) 2 mg Oral Capsule Take 2 mg by mouth Every 4 hours as needed    loratadine (CLARITIN) 10 mg Oral Tablet Take 1 Tab (10 mg  total) by mouth Once a day    magnesium oxide (MAG-OX) 400 mg Oral Tablet Take 400 mg by mouth Once a day    omeprazole (PRILOSEC) 20 mg Oral Capsule, Delayed Release(E.C.) Take 40 mg by mouth Once a day     oxyCODONE (ROXICODONE) 5 mg Oral Tablet Take 1 Tablet (5 mg total) by mouth Every 6 hours as needed for Pain    potassium chloride (K-DUR) 20 mEq Oral Tab Sust.Rel. Particle/Crystal Take 20 mEq by mouth Once a day Takes 3 days a week    predniSONE (DELTASONE) 20 mg Oral Tablet Take 2 Tablets (40 mg total) by mouth Once a day for 3 days    prochlorperazine (COMPAZINE) 10 mg Oral Tablet Take 1 Tab (10 mg total) by mouth Four times a day as needed for Nausea/Vomiting    rOPINIRole (REQUIP) 0.5 mg Oral Tablet Take 0.5 mg by mouth Every night     Social History     Socioeconomic History    Marital status: Married     Spouse name: Not on file    Number of children: Not on file    Years of education: Not on file    Highest education level: Not on file   Tobacco Use    Smoking status: Former Smoker    Smokeless tobacco: Never Used    Tobacco comment: quit 10 yrs ago   Substance and Sexual Activity    Alcohol use: Not Currently     Comment: rarely    Drug use: No   Other Topics Concern    Ability to Walk 1 Flight of Steps without SOB/CP No    Ability  To Do Own ADL's Yes     Social Determinants of Health     Financial Resource Strain:     Difficulty of Paying Living Expenses:    Food Insecurity:     Worried About Charity fundraiser in the Last Year:     Arboriculturist in the Last Year:    Transportation Needs:     Film/video editor (Medical):     Lack of Transportation (Non-Medical):    Physical Activity:     Days of Exercise per Week:     Minutes of Exercise per Session:    Stress:     Feeling of Stress :    Intimate Partner Violence:     Fear of Current or Ex-Partner:     Emotionally Abused:     Physically Abused:     Sexually Abused:        PHYSICAL EXAMINATION:  General Vitals: BP (!) 118/58   Pulse 79   Temp  36 C (96.8 F) (Thermal Scan)   Resp 16   Ht 1.702 m (5' 7" )   Wt 122 kg (270 lb)   SpO2 96%   BMI 42.29 kg/m       PHYSICAL EXAM:   Consitutional: Appears fatigue  Eyes: EOMI. No discharge. No Jaundice.   ENT: mucous membranes dry. No posterior pharynx lesions.. Neck supple, No palpable masses   Heme/ Lymph: No Cervical, Inguinal, axillary lymh nodes. No Bruising.   Cardiovascular: S1, S2, No murmurs, rubs, or gallops.  Respiratory: RLL diminished sounds and otherwise coarse lung sounds noted.   Abdomen: Normal Bowel Sounds, nontender nondistended. No hepatosplenomegaly.   Musculoskeletal: No Edema to the extremities.   Skin: Normal turgor. No Rashes,skin lesions   Psychiatry: Normal Affect   Neuro: No focal deficits. Alert and Oriented x 3      ENCOUNTER ORDERS:  Orders Placed This Encounter    CT CHEST WO IV CONTRAST    ADDITIONAL TESTING  REQUEST    THYROID STIMULATING HORMONE (SENSITIVE TSH)    azithromycin (ZITHROMAX) 250 mg Oral Tablet    predniSONE (DELTASONE) 20 mg Oral Tablet       LABORATORY/RADIOLOGICAL DATA: All pertinent labs/radiology were reviewed.   CBC  Diff   Lab Results   Component Value Date/Time    WBC 6.9 10/29/2019 09:09 AM    HGB 11.9 10/29/2019 09:09 AM    HCT 36.7 10/29/2019 09:09 AM    PLTCNT 286 10/29/2019 09:09 AM    RBC 4.99 10/29/2019 09:09 AM    MCV 73.5 (L) 10/29/2019 09:09 AM    MCHC 32.6 10/29/2019 09:09 AM    MCH 23.9 (L) 10/29/2019 09:09 AM    RDW 19.8 (H) 10/29/2019 09:09 AM    MPV 7.4 10/29/2019 09:09 AM    Lab Results   Component Value Date/Time    PMNS 72 10/29/2019 09:09 AM    LYMPHOCYTES 11 10/29/2019 09:09 AM    EOSINOPHIL 3 10/29/2019 09:09 AM    MONOCYTES 13 10/29/2019 09:09 AM    BASOPHILS 1 10/29/2019 09:09 AM    BASOPHILS 0.10 10/29/2019 09:09 AM    PMNABS 5.00 10/29/2019 09:09 AM    LYMPHSABS 0.70 (L) 10/29/2019 09:09 AM    EOSABS 0.20 10/29/2019 09:09 AM    MONOSABS 0.90 10/29/2019 09:09 AM    BASOSABS 0.10 11/10/2014 02:28 PM    BASABS 0.01 09/17/2018 08:24  AM        COMPREHENSIVE METABOLIC PANEL  Lab Results  Component Value Date    SODIUM 136 10/29/2019    POTASSIUM 4.3 10/29/2019    CHLORIDE 104 10/29/2019    CO2 25 10/29/2019    ANIONGAP 7 10/29/2019    BUN 12 10/29/2019    CREATININE 0.88 10/29/2019    GLUCOSE Negative 09/24/2018    CALCIUM 8.6 (L) 10/29/2019    PHOSPHORUS 3.2 03/12/2019    ALBUMIN 3.5 10/29/2019    TOTALPROTEIN 6.0 10/29/2019    ALKPHOS 72 10/29/2019    AST 37 (H) 10/29/2019    ALT 35 10/29/2019    BILIRUBINCON 0.2 03/22/2018     THYROID STIMULATING HORMONE  Lab Results   Component Value Date    TSH 3.752 10/29/2019         ASSESSMENT AND PLAN:  69 y.o. female with history of stage III colon cancer status post 5 cycle of FOLFOX now presenting with stage III lung cancer.      ICD-10-CM    1. Malignant neoplasm of lung, unspecified laterality, unspecified part of lung (CMS HCC)  C34.90 CT CHEST WO IV CONTRAST   2. Hypothyroidism, unspecified type  E03.9 ADDITIONAL TESTING  REQUEST     THYROID STIMULATING HORMONE (SENSITIVE TSH)   3. Pleural effusion  J90 CT CHEST WO IV CONTRAST   4. Encounter for antineoplastic chemotherapy  Z51.11    5. Cancer related pain  G89.3    6. Elevated TSH  R79.89    7. Abnormal results of thyroid function studies   R94.6    8. Anemia, unspecified type  D64.9    9. Transaminitis  R74.01    10. CINV (chemotherapy-induced nausea and vomiting)  R11.2     T45.1X5A    11. Dyspnea, unspecified type  R06.00    12. Dyspnea on exertion  R06.00    13. Chemotherapy induced nausea and vomiting  R11.2     T45.1X5A    14. Malignant neoplasm of unspecified part of right bronchus or lung (CMS HCC)   C34.91    15. Pneumonitis  J18.9        1. Adenocarcinoma lung:  Stage III.   CT brain to rule out metastatic disease.  NEGATIVE.  Started on chemoradiation carboplatin/Taxol along with radiation- July 23, 2018.  After completion of chemo radiation patient will get PET scan.  If continued to have good response will start maintenance  immunotherapy for 1 year.  Completed 6/6 cycles of Taxol/Carboplatin and will start maintenance immunotherapy therapy following radiation therapy and PET scan    CHEMOTHERAPY:/RADIATION  CYCLE 16/26 DAY   DRUG DOSE TOTAL DOSE % ROUTE SCHEDULE   Carboplatin AUC- 2 205 mg    COMPLETED   Paclitaxel 45 mg/m2 108 mg    COMPLETED   Durvalumab     Q 2 wks                2. Neutropenia:  Due to chemotherapy.  Resolved.    3. CINV   Zofran PRN    4. Transaminitis: RESOLVED  Instructed to avoid tylenol/alcoholic beverages    5. Thrombocytopenia:  Resolved    6. Dyspnea/fevers: RESOLVED  Better     7. Hypokalemia  Patient on K+ at home  Potassium today 3.5    8. Pain  Patient reports chronic in nature.  Oxycodone every 6 hours     9. Hypothyroidism  Elevated TSH - TSH today 84.1, will increase dose of Synthroid to 112 mcg.  Recommended to take the medication on an empty stomach  on regular basis.  Noncompliance can increase risk for myxedema coma.    10. Patchy infiltrates in bilateral lungs  - prescribed antibiotics and steroids instructed to come to the emergency room if worsening shortness of breath      Return in about 1 week (around 11/05/2019) for cbc/diff, CMP, EXAM, Treatment, Deatra Canter!!!!!!!!!!!!!!!!!!!!!!!!!!!!!!!!!!!!!!!!!!!!.    Omer Jack, APRN, FNP-C  Hematology/Oncology       CC:  [facility]  [place] [address]    The patient was seen independently by the Nurse Practitioner. I reviewed the patient's chart and agree with the NP's plan.     Vanetta Mulders, MD  Hematology/Oncology

## 2019-11-02 ENCOUNTER — Other Ambulatory Visit (HOSPITAL_COMMUNITY): Payer: Self-pay | Admitting: Internal Medicine

## 2019-11-02 ENCOUNTER — Other Ambulatory Visit (HOSPITAL_COMMUNITY): Payer: Self-pay | Admitting: Nurse Practitioner

## 2019-11-02 ENCOUNTER — Telehealth (HOSPITAL_COMMUNITY): Payer: Self-pay | Admitting: Nurse Practitioner

## 2019-11-02 MED ORDER — AMOXICILLIN 875 MG TABLET
875.00 mg | ORAL_TABLET | Freq: Two times a day (BID) | ORAL | 0 refills | Status: AC
Start: 2019-11-02 — End: 2019-11-09

## 2019-11-02 NOTE — Telephone Encounter (Signed)
Pt called stating that she is afraid that she may be allergic to the azithromycin recently given to her. She is requesting to have amoxicillin sent to the [facility] in [place].  Please advise pt. [place], MA

## 2019-11-02 NOTE — Telephone Encounter (Signed)
Discontinued azithromycin and sent in from amoxicillin. Omer Jack, APRN, FNP-C  Hematology/Oncology

## 2019-11-03 ENCOUNTER — Ambulatory Visit (HOSPITAL_COMMUNITY): Payer: Self-pay | Admitting: Internal Medicine

## 2019-11-03 MED ORDER — OXYCODONE 5 MG TABLET
5.00 mg | ORAL_TABLET | Freq: Four times a day (QID) | ORAL | 0 refills | Status: DC | PRN
Start: 2019-11-03 — End: 2019-11-24

## 2019-11-03 NOTE — Telephone Encounter (Signed)
Patient called in stating she is still having problems breathing and would like a call back.  Thanks!  Sharlett Iles, [place]

## 2019-11-04 ENCOUNTER — Telehealth (HOSPITAL_COMMUNITY): Payer: Self-pay | Admitting: Nurse Practitioner

## 2019-11-04 NOTE — Telephone Encounter (Signed)
Encouraged patient to return to the emergency room as her breathing is worsening.  Patient did have ground-glass opacities in was on antibiotics and steroids without any response.  Patient may have immune related symptoms and will likely need IV steroids.  Patient's husband verbalized understanding. Omer Jack, APRN, FNP-C  Hematology/Oncology

## 2019-11-05 ENCOUNTER — Inpatient Hospital Stay (HOSPITAL_COMMUNITY): Payer: Medicare Other

## 2019-11-05 ENCOUNTER — Encounter (HOSPITAL_COMMUNITY): Payer: Self-pay | Admitting: Internal Medicine

## 2019-11-05 ENCOUNTER — Other Ambulatory Visit: Payer: Self-pay

## 2019-11-05 ENCOUNTER — Ambulatory Visit
Admission: RE | Admit: 2019-11-05 | Discharge: 2019-11-05 | Disposition: A | Discharge status: Home / Self Care | Payer: Medicare Other | Source: Ambulatory Visit | Attending: Internal Medicine | Admitting: Internal Medicine

## 2019-11-05 ENCOUNTER — Ambulatory Visit (HOSPITAL_BASED_OUTPATIENT_CLINIC_OR_DEPARTMENT_OTHER): Payer: Medicare Other | Admitting: Internal Medicine

## 2019-11-05 VITALS — BP 130/71 | HR 88 | Temp 97.7°F | Ht 62.0 in | Wt 253.5 lb

## 2019-11-05 DIAGNOSIS — Z7952 Long term (current) use of systemic steroids: Secondary | ICD-10-CM | POA: Insufficient documentation

## 2019-11-05 DIAGNOSIS — G8929 Other chronic pain: Secondary | ICD-10-CM

## 2019-11-05 DIAGNOSIS — E785 Hyperlipidemia, unspecified: Secondary | ICD-10-CM | POA: Insufficient documentation

## 2019-11-05 DIAGNOSIS — E876 Hypokalemia: Secondary | ICD-10-CM

## 2019-11-05 DIAGNOSIS — C3490 Malignant neoplasm of unspecified part of unspecified bronchus or lung: Secondary | ICD-10-CM | POA: Insufficient documentation

## 2019-11-05 DIAGNOSIS — Z87891 Personal history of nicotine dependence: Secondary | ICD-10-CM | POA: Insufficient documentation

## 2019-11-05 DIAGNOSIS — D701 Agranulocytosis secondary to cancer chemotherapy: Secondary | ICD-10-CM | POA: Insufficient documentation

## 2019-11-05 DIAGNOSIS — R112 Nausea with vomiting, unspecified: Secondary | ICD-10-CM

## 2019-11-05 DIAGNOSIS — Z5111 Encounter for antineoplastic chemotherapy: Secondary | ICD-10-CM

## 2019-11-05 DIAGNOSIS — Z7901 Long term (current) use of anticoagulants: Secondary | ICD-10-CM | POA: Insufficient documentation

## 2019-11-05 DIAGNOSIS — I4891 Unspecified atrial fibrillation: Secondary | ICD-10-CM | POA: Insufficient documentation

## 2019-11-05 DIAGNOSIS — E039 Hypothyroidism, unspecified: Secondary | ICD-10-CM | POA: Insufficient documentation

## 2019-11-05 DIAGNOSIS — E669 Obesity, unspecified: Secondary | ICD-10-CM | POA: Insufficient documentation

## 2019-11-05 DIAGNOSIS — J449 Chronic obstructive pulmonary disease, unspecified: Secondary | ICD-10-CM | POA: Insufficient documentation

## 2019-11-05 DIAGNOSIS — I10 Essential (primary) hypertension: Secondary | ICD-10-CM | POA: Insufficient documentation

## 2019-11-05 DIAGNOSIS — R918 Other nonspecific abnormal finding of lung field: Secondary | ICD-10-CM

## 2019-11-05 DIAGNOSIS — G893 Neoplasm related pain (acute) (chronic): Secondary | ICD-10-CM | POA: Insufficient documentation

## 2019-11-05 DIAGNOSIS — Z79899 Other long term (current) drug therapy: Secondary | ICD-10-CM | POA: Insufficient documentation

## 2019-11-05 DIAGNOSIS — Z7951 Long term (current) use of inhaled steroids: Secondary | ICD-10-CM | POA: Insufficient documentation

## 2019-11-05 LAB — CBC WITH DIFF
BASOPHIL #: 0.1 10*3/uL (ref 0.00–0.20)
BASOPHIL %: 1 %
EOSINOPHIL #: 0.2 10*3/uL (ref 0.00–0.50)
EOSINOPHIL %: 2 %
HCT: 39 % (ref 34.6–46.2)
HGB: 12.3 g/dL (ref 11.8–15.8)
LYMPHOCYTE #: 0.9 10*3/uL (ref 0.90–3.40)
LYMPHOCYTE %: 11 %
MCH: 23.8 pg — ABNORMAL LOW (ref 27.6–33.2)
MCHC: 31.7 g/dL — ABNORMAL LOW (ref 32.6–35.4)
MCV: 75.3 fL — ABNORMAL LOW (ref 82.3–96.7)
MONOCYTE #: 0.9 10*3/uL (ref 0.20–0.90)
MONOCYTE %: 10 %
MPV: 7.4 fL (ref 6.6–10.2)
NEUTROPHIL #: 6.7 10*3/uL — ABNORMAL HIGH (ref 1.50–6.40)
NEUTROPHIL %: 76 %
PLATELETS: 284 10*3/uL (ref 140–440)
RBC: 5.17 10*6/uL (ref 3.80–5.24)
RDW: 20.3 % — ABNORMAL HIGH (ref 12.4–15.2)
WBC: 8.8 10*3/uL (ref 3.5–10.3)

## 2019-11-05 LAB — COMPREHENSIVE METABOLIC PANEL, NON-FASTING
ALBUMIN: 3.5 g/dL (ref 3.2–4.6)
ALKALINE PHOSPHATASE: 85 U/L (ref 20–130)
ALT (SGPT): 55 U/L — ABNORMAL HIGH (ref <=52)
ANION GAP: 6 mmol/L
AST (SGOT): 39 U/L — ABNORMAL HIGH (ref <=35)
BILIRUBIN TOTAL: 0.5 mg/dL (ref 0.3–1.2)
BUN/CREA RATIO: 19
BUN: 16 mg/dL (ref 10–25)
CALCIUM: 8.6 mg/dL — ABNORMAL LOW (ref 8.8–10.3)
CHLORIDE: 107 mmol/L (ref 98–111)
CO2 TOTAL: 24 mmol/L (ref 21–35)
CREATININE: 0.85 mg/dL (ref <=1.30)
ESTIMATED GFR: >60 mL/min/{1.73_m2}
GLUCOSE: 141 mg/dL — ABNORMAL HIGH (ref 70–110)
POTASSIUM: 4 mmol/L (ref 3.5–5.0)
PROTEIN TOTAL: 5.8 g/dL — ABNORMAL LOW (ref 6.0–8.3)
SODIUM: 137 mmol/L (ref 135–145)

## 2019-11-05 LAB — THYROID STIMULATING HORMONE (SENSITIVE TSH): TSH: 2.561 u[IU]/mL (ref 0.450–5.330)

## 2019-11-05 MED ORDER — PREDNISONE 5 MG TABLET
ORAL_TABLET | ORAL | 0 refills | Status: AC
Start: 2019-11-05 — End: 2019-11-08

## 2019-11-05 NOTE — Progress Notes (Signed)
[place]  [place] [address]        Encounter Date: 11/05/2019   9:30 AM EDT    Name:  Kelsey [name]  Age: 69 y.o.  DOB: 1950/10/17  Sex: female  PCP: [facility]    Chief Complaint:    Chief Complaint   Patient presents with   . Lung Cancer     HISTORY OF PRESENT ILLNESS:   69 y.o. female with history of stage III colon cancer of present evaluation and management of lung cancer.    1. September 2015. Patient underwent sigmoidoscopy and was found to have polyp which was adenocarcinoma.    2. June 10, 2014. She was admitted for segmental resection of sigmoid colon. This revealed pT3, N1a, MX, low-grade  adenocarcinoma of the colon. The mass measuring 4 x 3 x 0.5 centimeters. It  was low-grade and there was no evidence of microsatellite instability by  histology. Margins were uninvolved and 4 lymph nodes only were removed which  only 1 was involved. This corresponding stage IIIB, T3, N1a, M0, low-grade  sigmoid cancer.     3. July 07, 2014. Recommended adjuvant chemotherapy with FOLFOX.    4. July 27, 2014. Patient was started on adjuvant chemotherapy with FOLFOX.  Patient received only 5 treatments and chemotherapy was stopped due to poor tolerance.  After that patient lost follow-up.      5. October 2019. Patient was admitted to hospital with concern for  bowel obstruction.  CT chest showed lung nodules.  Patient was managed conservatively and discharged home.    6. May 06, 2018.  Patient underwent CT-guided biopsy of lung lesion.  Pathology showed well-differentiated adenocarcinoma.  Section shows well-differentiated adenocarcinoma which positive for CK 7 and TTF 1, negative for P 63 and CK 20.     7. June 23, 2018. PET scan showed Hypermetabolic right lower lobe pulmonary mass compatible with malignancy.  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 Hepatomegaly and  steatosis.     8. July 23, 2018. Patient was started on chemoradiation.    9. October 30, 2018. PET scan showed Hypermetabolic right lower lobe mass with abnormal mediastinal and hilar nodes. There has been only mild improvement since earlier exam.     10. October 31, 2018. Patient was started on maintenance immunotherapy with durvalumab.    11. February 17, 2019. CT chest revealed Moderate right effusion new from 12/19/2018. No change in the right lower lung mass or a presumed postradiation changes in the right upper lung when compared to 12/19/2018. Mass in the right cardiophrenic angle increased from 10/30/2019 6). Consider an enlarged lymph node here. This is unchanged from 12/19/2018 but increased since size from 10/30/2018. Another possibility is an enlarging pericardial cyst (this region was not hypermetabolic 6/33/3545). Mild enlargement of the left adrenal gland stable from 10/30/2018. Approximate 9 cm upper abdominal ventral hernia containing nonobstructed bowel loops unchanged    12. March 06, 2019. PET CT revealed 1. The RIGHT lower lobe mass has decreased in size and uptake, which is consistent with response to therapy. However, abnormal uptake remains. This is consistent with residual tumor. Suspect post radiation change in the RIGHT pneumothorax. Consolidation in the  medial RIGHT lung has tumor level uptake. Differential: Post radiation change, infection, and tumor infiltration. Additional findings are concerning for tumor infiltration. Follow-up recommended. Increased RIGHT hilar density and uptake. Concerning for tumor spread. Post radiation change less likely. 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 A low-attenuation mass in the RIGHT cardiophrenic angle may be related. Lymph node metastasis less likely.    13. October 29, 2019. CT chest revealed 1. Persistent dense consolidation in the RIGHT middle lobe. Compatible with post radiation change. Residual tumor cannot be excluded. 2. Scattered groundglass opacities  in both lungs. Favor post radiation change or infiltrate or tumor. Follow-up recommended. 3. A 5 mm nodule in the LEFT lingula is concerning for metastatic disease. Follow-up is recommended. PET/CT could be useful. Alternatively, tissue biopsy.      SUBJECTIVE:  Patient presents for follow-up and treatment.  Patient reports that overall she is feeling much better.  She is currently on p.o. antibiotics.  Denies any fevers or chills.  Denies any worsening shortness of breath.    REVIEW OF SYSTEMS:  GENERAL:   Denies fever or chills, reports fatigue   HEENT: no sore throat, congestion, blurry vision  Lungs:  Denies any worsening shortness of breath.  GI: no nausea, vomiting, constipation, no diarrhea  GU: No dysuria, urgency, increased frequency   Cardiac:  Denies any chest pain, palpitations, syncope,   Neuro: no weakness, loss of function, numbness, tingling  Skin: no rash  Musculoskeletal: No myalgias  Psychosocial: No depression, anxiety  Hematologic: No easy bruising, abnormal bleeding  All other review ROS negative except those mentioned above.     PATIENT HISTORY:  Past Medical History:   Diagnosis Date   . A-fib (CMS HCC)    . Arthropathy, unspecified, site unspecified    . Asthma    . Cancer (CMS HCC)     cervical   . Cataract    . Colon cancer (CMS [place]) 07/06/2014   . COPD (chronic obstructive pulmonary disease) (CMS HCC)    . Fistula    . HTN (hypertension)    . Hyperlipidemia    . Lung mass     with pleural effusion   . Obesity    . Sleep apnea    . Ventral hernia        Past Surgical History:   Procedure Laterality Date   . ABSCESS DRAINAGE     . BOWEL RESECTION     . CATARACT EXTRACTION     . HX CERVICAL CONE BIOPSY      . HX CESAREAN SECTION     . HX GASTRIC BYPASS      25 years ago   . [place] (x2)   . HX HYSTERECTOMY      late 1990s   . HX LAP BANDING      2013   . HX LAPAROTOMY      2013 to remove lap band       Family Medical History:     Problem Relation (Age of Onset)     Diabetes Mother, Maternal Grandmother, Maternal Grandfather, Father    Heart Attack Mother    Stroke Father          Current Outpatient Medications   Medication Sig   . albuterol sulfate (PROVENTIL OR VENTOLIN OR PROAIR) 90 mcg/actuation Inhalation HFA Aerosol Inhaler INHALE 1 TO 2 PUFFS BY MOUTH EVERY 6 HOURS AS NEEDED   . albuterol sulfate (PROVENTIL) 2.5 mg /3  mL (0.083 %) Inhalation Solution for Nebulization 3 mL (2.5 mg total) by Nebulization route Every 6 hours   . amoxicillin (AMOXIL) 875 mg Oral Tablet Take 1 Tablet (875 mg total) by mouth Twice daily for 7 days   . apixaban (ELIQUIS) 5 mg Oral Tablet Take 1 Tab (5 mg total) by mouth Twice daily   . atorvastatin (LIPITOR) 10 mg Oral Tablet Take 10 mg by mouth Once a day   . budesonide-formoteroL (SYMBICORT) 160-4.5 mcg/actuation Inhalation HFA Aerosol Inhaler Take 2 Puffs by inhalation Twice daily   . carvediloL (COREG) 3.125 mg Oral Tablet Take 3.125 mg by mouth Twice daily with food   . docusate sodium (COLACE) 100 mg Oral Capsule Take 100 mg by mouth Three times a day as needed    . flecainide (TAMBOCOR) 100 mg Oral Tablet Take 1 Tab (100 mg total) by mouth Twice daily   . fluticasone propionate (FLONASE) 50 mcg/actuation Nasal Spray, Suspension 1 Spray by Each Nostril route Once a day   . levothyroxine (SYNTHROID) 112 mcg Oral Tablet Take 1 Tab (112 mcg total) by mouth Every morning   . levothyroxine (SYNTHROID) 50 mcg Oral Tablet Take 1 Tab (50 mcg total) by mouth Every morning   . linaCLOtide (LINZESS) 72 mcg Oral Capsule Take 72 mcg by mouth Every morning   . lisinopriL (PRINIVIL) 20 mg Oral Tablet Take 20 mg by mouth Once a day   . loperamide (IMODIUM) 2 mg Oral Capsule Take 2 mg by mouth Every 4 hours as needed   . loratadine (CLARITIN) 10 mg Oral Tablet Take 1 Tab (10 mg total) by mouth Once a day   . magnesium oxide (MAG-OX) 400 mg Oral Tablet Take 400 mg by mouth Once a day   . omeprazole (PRILOSEC) 20 mg Oral Capsule, Delayed Release(E.C.) Take  40 mg by mouth Once a day    . oxyCODONE (ROXICODONE) 5 mg Oral Tablet Take 1 Tablet (5 mg total) by mouth Every 6 hours as needed for Pain   . potassium chloride (K-DUR) 20 mEq Oral Tab Sust.Rel. Particle/Crystal Take 20 mEq by mouth Once a day Takes 3 days a week   . predniSONE (DELTASONE) 5 mg Oral Tablet Take 5 Tab (94m) by mouth QD x1 day. Then 2 Tab (18m QD x1 day. Then 1 Tab (16m51mQD x1 day.   . prochlorperazine (COMPAZINE) 10 mg Oral Tablet Take 1 Tab (10 mg total) by mouth Four times a day as needed for Nausea/Vomiting   . rOPINIRole (REQUIP) 0.5 mg Oral Tablet Take 0.5 mg by mouth Every night     Social History     Socioeconomic History   . Marital status: Married     Spouse name: Not on file   . Number of children: Not on file   . Years of education: Not on file   . Highest education level: Not on file   Tobacco Use   . Smoking status: Former [employer] scientist (life sciences). Smokeless tobacco: Never Used   . Tobacco comment: quit 10 yrs ago   Substance and Sexual Activity   . Alcohol use: Not Currently     Comment: rarely   . Drug use: No   Other Topics Concern   . Ability to Walk 1 Flight of Steps without SOB/CP No   . Ability To Do Own ADL's Yes     Social Determinants of Health     Financial Resource Strain:    . Difficulty of Paying Living  Expenses:    Food Insecurity:    . Worried About Charity fundraiser in the Last Year:    . Arboriculturist in the Last Year:    Transportation Needs:    . Film/video editor (Medical):    Marland Kitchen Lack of Transportation (Non-Medical):    Physical Activity:    . Days of Exercise per Week:    . Minutes of Exercise per Session:    Stress:    . Feeling of Stress :    Intimate Partner Violence:    . Fear of Current or Ex-Partner:    . Emotionally Abused:    Marland Kitchen Physically Abused:    . Sexually Abused:        PHYSICAL EXAMINATION:  General Vitals: BP 130/71   Pulse 88   Temp 36.5 C (97.7 F)   Ht 1.575 m (5' 2" )   Wt 115 kg (253 lb 8 oz)   SpO2 96%   BMI 46.37 kg/m       PHYSICAL EXAM:     Consitutional: Appears fatigue  Eyes: EOMI. No discharge. No Jaundice.   ENT: mucous membranes dry. No posterior pharynx lesions.. Neck supple, No palpable masses   Heme/ Lymph: No Cervical, Inguinal, axillary lymh nodes. No Bruising.   Cardiovascular: S1, S2, No murmurs, rubs, or gallops.  Respiratory: RLL diminished sounds and otherwise coarse lung sounds noted.   Abdomen: Normal Bowel Sounds, nontender nondistended. No hepatosplenomegaly.   Musculoskeletal: No Edema to the extremities.   Skin: Normal turgor. No Rashes,skin lesions   Psychiatry: Normal Affect   Neuro: No focal deficits. Alert and Oriented x 3      ENCOUNTER ORDERS:  Orders Placed This Encounter   . predniSONE (DELTASONE) 5 mg Oral Tablet       LABORATORY/RADIOLOGICAL DATA: All pertinent labs/radiology were reviewed.   CBC  Diff   Lab Results   Component Value Date/Time    WBC 8.8 11/05/2019 09:45 AM    HGB 12.3 11/05/2019 09:45 AM    HCT 39.0 11/05/2019 09:45 AM    PLTCNT 284 11/05/2019 09:45 AM    RBC 5.17 11/05/2019 09:45 AM    MCV 75.3 (L) 11/05/2019 09:45 AM    MCHC 31.7 (L) 11/05/2019 09:45 AM    MCH 23.8 (L) 11/05/2019 09:45 AM    RDW 20.3 (H) 11/05/2019 09:45 AM    MPV 7.4 11/05/2019 09:45 AM    Lab Results   Component Value Date/Time    PMNS 76 11/05/2019 09:45 AM    LYMPHOCYTES 11 11/05/2019 09:45 AM    EOSINOPHIL 2 11/05/2019 09:45 AM    MONOCYTES 10 11/05/2019 09:45 AM    BASOPHILS 1 11/05/2019 09:45 AM    BASOPHILS 0.10 11/05/2019 09:45 AM    PMNABS 6.70 (H) 11/05/2019 09:45 AM    LYMPHSABS 0.90 11/05/2019 09:45 AM    EOSABS 0.20 11/05/2019 09:45 AM    MONOSABS 0.90 11/05/2019 09:45 AM    BASOSABS 0.10 11/10/2014 02:28 PM    BASABS 0.01 09/17/2018 08:24 AM        COMPREHENSIVE METABOLIC PANEL  Lab Results   Component Value Date    SODIUM 137 11/05/2019    POTASSIUM 4.0 11/05/2019    CHLORIDE 107 11/05/2019    CO2 24 11/05/2019    ANIONGAP 6 11/05/2019    BUN 16 11/05/2019    CREATININE 0.85 11/05/2019    GLUCOSE Negative 09/24/2018     CALCIUM 8.6 (L) 11/05/2019    PHOSPHORUS  3.2 03/12/2019    ALBUMIN 3.5 11/05/2019    TOTALPROTEIN 5.8 (L) 11/05/2019    ALKPHOS 85 11/05/2019    AST 39 (H) 11/05/2019    ALT 55 (H) 11/05/2019    BILIRUBINCON 0.2 03/22/2018     THYROID STIMULATING HORMONE  Lab Results   Component Value Date    TSH 2.561 11/05/2019         ASSESSMENT AND PLAN:  69 y.o. female with history of stage III colon cancer status post 5 cycle of FOLFOX now presenting with stage III lung cancer.      ICD-10-CM    1. Malignant neoplasm of lung, unspecified laterality, unspecified part of lung (CMS HCC)  C34.90        1. Adenocarcinoma lung:  Stage III.    CT brain to rule out metastatic disease.  NEGATIVE.   Started on chemoradiation carboplatin/Taxol along with radiation- July 23, 2018.  After completion of chemo radiation patient will get PET scan.  If continued to have good response will start maintenance immunotherapy for 1 year.   Completed 6/6 cycles of Taxol/Carboplatin and will start maintenance immunotherapy therapy following radiation therapy and PET scan    CHEMOTHERAPY:/RADIATION  CYCLE 17/26 DAY   DRUG DOSE TOTAL DOSE % ROUTE SCHEDULE   Carboplatin AUC- 2 205 mg    COMPLETED   Paclitaxel 45 mg/m2 108 mg    COMPLETED   Durvalumab     Q 2 wks            Hold treatment today.    2. Neutropenia:  Due to chemotherapy.   Resolved.    3. CINV    Zofran PRN    4. Transaminitis: RESOLVED   Instructed to avoid tylenol/alcoholic beverages    5. Thrombocytopenia:  Resolved    6. Dyspnea/fevers: RESOLVED  Better     7. Hypokalemia   Patient on K+ at home   Potassium today 4.0    8. Pain   Patient reports chronic in nature.   Oxycodone every 6 hours     9. Hypothyroidism   Elevated TSH - TSH today 2.5.  Continue current dose of Synthroid.    10. Patchy infiltrates in bilateral lungs  - overall feeling better.  Currently on antibiotics.  Will send a tapering dose of steroids.  Will give her break from immunotherapy for 2  weeks.    Transaminitis:  Will hold immunotherapy today.  Repeat in 2 weeks.  Recommended to avoid any Tylenol use.  Return in about 2 weeks (around 11/19/2019) for cbc/diff, CMP, Treatment.    Vanetta Mulders, MD  Hematology/Oncology

## 2019-11-19 ENCOUNTER — Encounter (HOSPITAL_COMMUNITY): Payer: Self-pay | Admitting: Internal Medicine

## 2019-11-19 ENCOUNTER — Inpatient Hospital Stay (HOSPITAL_COMMUNITY): Payer: Medicare Other

## 2019-11-21 ENCOUNTER — Other Ambulatory Visit (HOSPITAL_COMMUNITY): Payer: Self-pay | Admitting: Nurse Practitioner

## 2019-11-24 ENCOUNTER — Other Ambulatory Visit (HOSPITAL_COMMUNITY): Payer: Self-pay | Admitting: Internal Medicine

## 2019-11-24 MED ORDER — OXYCODONE 5 MG TABLET
5.00 mg | ORAL_TABLET | Freq: Four times a day (QID) | ORAL | 0 refills | Status: DC | PRN
Start: 2019-11-24 — End: 2020-01-08

## 2019-11-26 ENCOUNTER — Ambulatory Visit (HOSPITAL_COMMUNITY)
Admission: RE | Admit: 2019-11-26 | Discharge: 2019-11-26 | Disposition: A | Discharge status: Home / Self Care | Payer: Medicare Other | Source: Ambulatory Visit

## 2019-11-26 ENCOUNTER — Other Ambulatory Visit: Payer: Self-pay

## 2019-11-26 ENCOUNTER — Encounter (HOSPITAL_COMMUNITY): Payer: Self-pay | Admitting: Internal Medicine

## 2019-11-26 ENCOUNTER — Inpatient Hospital Stay
Admission: RE | Admit: 2019-11-26 | Discharge: 2019-11-26 | Disposition: A | Discharge status: Still In Hospital | Payer: Medicare Other | Source: Ambulatory Visit | Attending: Internal Medicine | Admitting: Internal Medicine

## 2019-11-26 ENCOUNTER — Ambulatory Visit (HOSPITAL_BASED_OUTPATIENT_CLINIC_OR_DEPARTMENT_OTHER): Payer: Medicare Other | Admitting: Internal Medicine

## 2019-11-26 VITALS — BP 129/68 | HR 83 | Temp 96.5°F | Ht 62.0 in | Wt 251.5 lb

## 2019-11-26 VITALS — BP 129/68 | HR 83 | Temp 96.4°F | Resp 18 | Ht 62.0 in | Wt 251.3 lb

## 2019-11-26 DIAGNOSIS — E039 Hypothyroidism, unspecified: Secondary | ICD-10-CM

## 2019-11-26 DIAGNOSIS — Z5111 Encounter for antineoplastic chemotherapy: Secondary | ICD-10-CM | POA: Insufficient documentation

## 2019-11-26 DIAGNOSIS — R7989 Other specified abnormal findings of blood chemistry: Secondary | ICD-10-CM | POA: Insufficient documentation

## 2019-11-26 DIAGNOSIS — G893 Neoplasm related pain (acute) (chronic): Secondary | ICD-10-CM | POA: Insufficient documentation

## 2019-11-26 DIAGNOSIS — C3490 Malignant neoplasm of unspecified part of unspecified bronchus or lung: Secondary | ICD-10-CM

## 2019-11-26 DIAGNOSIS — R946 Abnormal results of thyroid function studies: Secondary | ICD-10-CM | POA: Insufficient documentation

## 2019-11-26 DIAGNOSIS — C3431 Malignant neoplasm of lower lobe, right bronchus or lung: Secondary | ICD-10-CM | POA: Insufficient documentation

## 2019-11-26 LAB — CBC WITH DIFF
BASOPHIL #: 0.1 10*3/uL (ref 0.00–0.20)
BASOPHIL %: 1 %
EOSINOPHIL #: 0.2 10*3/uL (ref 0.00–0.50)
EOSINOPHIL %: 4 %
HCT: 37 % (ref 34.6–46.2)
HGB: 11.9 g/dL (ref 11.8–15.8)
LYMPHOCYTE #: 0.9 10*3/uL (ref 0.90–3.40)
LYMPHOCYTE %: 14 %
MCH: 23.8 pg — ABNORMAL LOW (ref 27.6–33.2)
MCHC: 32.2 g/dL — ABNORMAL LOW (ref 32.6–35.4)
MCV: 74.1 fL — ABNORMAL LOW (ref 82.3–96.7)
MONOCYTE #: 0.9 10*3/uL (ref 0.20–0.90)
MONOCYTE %: 15 %
MPV: 7 fL (ref 6.6–10.2)
NEUTROPHIL #: 4.1 10*3/uL (ref 1.50–6.40)
NEUTROPHIL %: 66 %
PLATELETS: 316 10*3/uL (ref 140–440)
RBC: 4.98 10*6/uL (ref 3.80–5.24)
RDW: 18.9 % — ABNORMAL HIGH (ref 12.4–15.2)
WBC: 6.2 10*3/uL (ref 3.5–10.3)

## 2019-11-26 LAB — COMPREHENSIVE METABOLIC PANEL, NON-FASTING
ALBUMIN: 3.5 g/dL (ref 3.2–4.6)
ALKALINE PHOSPHATASE: 83 U/L (ref 20–130)
ALT (SGPT): 24 U/L (ref <=52)
ANION GAP: 6 mmol/L
AST (SGOT): 23 U/L (ref <=35)
BILIRUBIN TOTAL: 0.6 mg/dL (ref 0.3–1.2)
BUN/CREA RATIO: 12
BUN: 10 mg/dL (ref 10–25)
CALCIUM: 8.4 mg/dL — ABNORMAL LOW (ref 8.8–10.3)
CHLORIDE: 108 mmol/L (ref 98–111)
CO2 TOTAL: 22 mmol/L (ref 21–35)
CREATININE: 0.81 mg/dL (ref <=1.30)
ESTIMATED GFR: >60 mL/min/{1.73_m2}
GLUCOSE: 100 mg/dL (ref 70–110)
POTASSIUM: 4 mmol/L (ref 3.5–5.0)
PROTEIN TOTAL: 6 g/dL (ref 6.0–8.3)
SODIUM: 136 mmol/L (ref 135–145)

## 2019-11-26 LAB — THYROID STIMULATING HORMONE (SENSITIVE TSH): TSH: 5.614 u[IU]/mL — ABNORMAL HIGH (ref 0.450–5.330)

## 2019-11-26 MED ORDER — SODIUM CHLORIDE 0.9 % INTRAVENOUS SOLUTION
10.0000 mg/kg | Freq: Once | INTRAVENOUS | Status: DC
Start: 2019-11-26 — End: 2019-11-26

## 2019-11-26 MED ORDER — SODIUM CHLORIDE 0.9 % INTRAVENOUS SOLUTION
10.0000 mg/kg | Freq: Once | INTRAVENOUS | Status: AC
Start: 2019-11-26 — End: 2019-11-26
  Administered 2019-11-26: 0 mg via INTRAVENOUS
  Administered 2019-11-26: 1140 mg via INTRAVENOUS
  Filled 2019-11-26: qty 22.8

## 2019-11-26 MED ORDER — LEVOTHYROXINE 112 MCG TABLET
112.00 ug | ORAL_TABLET | Freq: Every morning | ORAL | 4 refills | Status: AC
Start: 2019-11-26 — End: ?

## 2019-11-26 NOTE — Nurses Notes (Addendum)
[place] flushed with 10 cc NS and + br. Imfinzi infusion started over 1 hour with a filter. Shirlean Kelly, RN  [phone]: Imfinzi infusion complete. Port flushed 10cc NS, +br. Port flushed with 5cc heparin per protocol and deaccessed. Band-Aid applied to site. Pt received gas card for next visit. Pt is aware of next appt and left unit in wheelchair. Shirlean Kelly, RN

## 2019-11-26 NOTE — Addendum Note (Signed)
Addended byVanetta Mulders on: 11/26/2019 09:32 AM     Modules accepted: Orders

## 2019-11-26 NOTE — Progress Notes (Signed)
[place] [id number]        Encounter Date: 11/26/2019   8:30 AM EDT    Name:  Kelsey [name]  Age: 69 y.o.  DOB: [birth year]  Sex: female  PCP: Benjie Karvonen    Chief Complaint:    Chief Complaint   Patient presents with    Lung Cancer     HISTORY OF PRESENT ILLNESS:   69 y.o. female with history of stage III colon cancer of present evaluation and management of lung cancer.    1. September 2015. Patient underwent sigmoidoscopy and was found to have polyp which was adenocarcinoma.    2. June 10, 2014. She was admitted for segmental resection of sigmoid colon. This revealed pT3, N1a, MX, low-grade  adenocarcinoma of the colon. The mass measuring 4 x 3 x 0.5 centimeters. It  was low-grade and there was no evidence of microsatellite instability by  histology. Margins were uninvolved and 4 lymph nodes only were removed which  only 1 was involved. This corresponding stage IIIB, T3, N1a, M0, low-grade  sigmoid cancer.     3. July 07, 2014. Recommended adjuvant chemotherapy with FOLFOX.    4. July 27, 2014. Patient was started on adjuvant chemotherapy with FOLFOX.  Patient received only 5 treatments and chemotherapy was stopped due to poor tolerance.  After that patient lost follow-up.      5. October 2019. Patient was admitted to hospital with concern for  bowel obstruction.  CT chest showed lung nodules.  Patient was managed conservatively and discharged home.    6. May 06, 2018.  Patient underwent CT-guided biopsy of lung lesion.  Pathology showed well-differentiated adenocarcinoma.  Section shows well-differentiated adenocarcinoma which positive for CK 7 and TTF 1, negative for P 63 and CK 20.     7. June 23, 2018. PET scan showed Hypermetabolic right lower lobe pulmonary mass compatible with malignancy.  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 Hepatomegaly and  steatosis.     8. July 23, 2018. Patient was started on chemoradiation.    9. October 30, 2018. PET scan showed Hypermetabolic right lower lobe mass with abnormal mediastinal and hilar nodes. There has been only mild improvement since earlier exam.     10. October 31, 2018. Patient was started on maintenance immunotherapy with durvalumab.    11. February 17, 2019. CT chest revealed Moderate right effusion new from 12/19/2018. No change in the right lower lung mass or a presumed postradiation changes in the right upper lung when compared to 12/19/2018. Mass in the right cardiophrenic angle increased from 10/30/2019 6). Consider an enlarged lymph node here. This is unchanged from 12/19/2018 but increased since size from 10/30/2018. Another possibility is an enlarging pericardial cyst (this region was not hypermetabolic 5/78/4696). Mild enlargement of the left adrenal gland stable from 10/30/2018. Approximate 9 cm upper abdominal ventral hernia containing nonobstructed bowel loops unchanged    12. March 06, 2019. PET CT revealed 1. The RIGHT lower lobe mass has decreased in size and uptake, which is consistent with response to therapy. However, abnormal uptake remains. This is consistent with residual tumor. Suspect post radiation change in the RIGHT pneumothorax. Consolidation in the  medial RIGHT lung has tumor level uptake. Differential: Post radiation change, infection, and tumor infiltration. Additional findings are concerning for tumor infiltration. Follow-up recommended. Increased RIGHT hilar density and uptake. Concerning for tumor spread. Post radiation change less likely. 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 A low-attenuation mass in the RIGHT cardiophrenic angle may be related. Lymph node metastasis less likely.    13. October 29, 2019. CT chest revealed 1. Persistent dense consolidation in the RIGHT middle lobe. Compatible with post radiation change. Residual tumor cannot be excluded. 2. Scattered groundglass opacities  in both lungs. Favor post radiation change or infiltrate or tumor. Follow-up recommended. 3. A 5 mm nodule in the LEFT lingula is concerning for metastatic disease. Follow-up is recommended. PET/CT could be useful. Alternatively, tissue biopsy.      SUBJECTIVE:  Patient presents for follow-up and treatment.  Denies any new complaints since last visit.  Overall feeling well.  Denies any fevers or chills.  Denies any worsening shortness of breath.    REVIEW OF SYSTEMS:  GENERAL:   Denies fever or chills, reports fatigue   HEENT: no sore throat, congestion, blurry vision  Lungs:  Denies any worsening shortness of breath.  GI: no nausea, vomiting, constipation, no diarrhea  GU: No dysuria, urgency, increased frequency   Cardiac:  Denies any chest pain, palpitations, syncope,   Neuro: no weakness, loss of function, numbness, tingling  Skin: no rash  Musculoskeletal: No myalgias  Psychosocial: No depression, anxiety  Hematologic: No easy bruising, abnormal bleeding  All other review ROS negative except those mentioned above.     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     HTN (hypertension)     Hyperlipidemia     Lung mass     with pleural effusion    Obesity     Sleep apnea     Ventral hernia        Past Surgical History:   Procedure Laterality Date    ABSCESS DRAINAGE      BOWEL RESECTION      CATARACT EXTRACTION      HX CERVICAL CONE BIOPSY       HX CESAREAN SECTION      HX GASTRIC BYPASS      25 years ago    [place] (x2)    HX HYSTERECTOMY      late 64s    HX LAP BANDING      2013    HX LAPAROTOMY      2013 to remove lap band       Family Medical History:     Problem Relation (Age of Onset)    Diabetes Mother, Maternal  Grandmother, Maternal Grandfather, Father    Heart Attack Mother    Stroke Father          Current Outpatient Medications   Medication Sig    albuterol sulfate (PROVENTIL OR VENTOLIN OR PROAIR) 90 mcg/actuation Inhalation HFA Aerosol Inhaler INHALE 1 TO 2 PUFFS BY MOUTH EVERY 6 HOURS AS NEEDED    albuterol sulfate (PROVENTIL) 2.5 mg /3 mL (0.083 %) Inhalation Solution  for Nebulization 3 mL (2.5 mg total) by Nebulization route Every 6 hours    apixaban (ELIQUIS) 5 mg Oral Tablet Take 1 Tab (5 mg total) by mouth Twice daily    atorvastatin (LIPITOR) 10 mg Oral Tablet Take 10 mg by mouth Once a day    budesonide-formoteroL (SYMBICORT) 160-4.5 mcg/actuation Inhalation HFA Aerosol Inhaler Take 2 Puffs by inhalation Twice daily    carvediloL (COREG) 3.125 mg Oral Tablet Take 3.125 mg by mouth Twice daily with food    docusate sodium (COLACE) 100 mg Oral Capsule Take 100 mg by mouth Three times a day as needed     flecainide (TAMBOCOR) 100 mg Oral Tablet Take 1 Tab (100 mg total) by mouth Twice daily    fluticasone propionate (FLONASE) 50 mcg/actuation Nasal Spray, Suspension 1 Spray by Each Nostril route Once a day    levothyroxine (SYNTHROID) 112 mcg Oral Tablet Take 1 Tablet (112 mcg total) by mouth Every morning    linaCLOtide (LINZESS) 72 mcg Oral Capsule Take 72 mcg by mouth Every morning    lisinopriL (PRINIVIL) 20 mg Oral Tablet Take 20 mg by mouth Once a day    loperamide (IMODIUM) 2 mg Oral Capsule Take 2 mg by mouth Every 4 hours as needed    loratadine (CLARITIN) 10 mg Oral Tablet TAKE 1 TABLET BY MOUTH EVERY DAY    magnesium oxide (MAG-OX) 400 mg Oral Tablet Take 400 mg by mouth Once a day    omeprazole (PRILOSEC) 20 mg Oral Capsule, Delayed Release(E.C.) Take 40 mg by mouth Once a day     oxyCODONE (ROXICODONE) 5 mg Oral Tablet Take 1 Tablet (5 mg total) by mouth Every 6 hours as needed for Pain    potassium chloride (K-DUR) 20 mEq Oral Tab Sust.Rel. Particle/Crystal Take 20 mEq by mouth  Once a day Takes 3 days a week    prochlorperazine (COMPAZINE) 10 mg Oral Tablet Take 1 Tab (10 mg total) by mouth Four times a day as needed for Nausea/Vomiting    rOPINIRole (REQUIP) 0.5 mg Oral Tablet Take 0.5 mg by mouth Every night     Social History     Socioeconomic History    Marital status: Married     Spouse name: Not on file    Number of children: Not on file    Years of education: Not on file    Highest education level: Not on file   Tobacco Use    Smoking status: Former Smoker    Smokeless tobacco: Never Used    Tobacco comment: quit 10 yrs ago   Substance and Sexual Activity    Alcohol use: Not Currently     Comment: rarely    Drug use: No   Other Topics Concern    Ability to Walk 1 Flight of Steps without SOB/CP No    Ability To Do Own ADL's Yes     Social Determinants of Health     Financial Resource Strain:     Difficulty of Paying Living Expenses:    Food Insecurity:     Worried About Charity fundraiser in the Last Year:     Arboriculturist in the Last Year:    Transportation Needs:     Film/video editor (Medical):     Lack of Transportation (Non-Medical):    Physical Activity:     Days of Exercise per Week:     Minutes of Exercise per Session:    Stress:  Feeling of Stress :    Intimate Partner Violence:     Fear of Current or Ex-Partner:     Emotionally Abused:     Physically Abused:     Sexually Abused:        PHYSICAL EXAMINATION:  General Vitals: BP 129/68    Pulse 83    Temp 35.8 C (96.5 F)    Ht 1.575 m ('5\' 2"'$ )    Wt 114 kg (251 lb 8 oz)    SpO2 97%    BMI 46.00 kg/m       PHYSICAL EXAM:   Consitutional: Appears fatigue  Eyes: EOMI. No discharge. No Jaundice.   ENT: mucous membranes dry. No posterior pharynx lesions.. Neck supple, No palpable masses   Heme/ Lymph: No Cervical, Inguinal, axillary lymh nodes. No Bruising.   Cardiovascular: S1, S2, No murmurs, rubs, or gallops.  Respiratory: RLL diminished sounds and otherwise coarse lung sounds noted.      Abdomen: Normal Bowel Sounds, nontender nondistended. No hepatosplenomegaly.   Musculoskeletal: No Edema to the extremities.   Skin: Normal turgor. No Rashes,skin lesions   Psychiatry: Normal Affect   Neuro: No focal deficits. Alert and Oriented x 3      ENCOUNTER ORDERS:  Orders Placed This Encounter    levothyroxine (SYNTHROID) 112 mcg Oral Tablet       LABORATORY/RADIOLOGICAL DATA: All pertinent labs/radiology were reviewed.   CBC  Diff   Lab Results   Component Value Date/Time    WBC 6.2 11/26/2019 08:20 AM    HGB 11.9 11/26/2019 08:20 AM    HCT 37.0 11/26/2019 08:20 AM    PLTCNT 316 11/26/2019 08:20 AM    RBC 4.98 11/26/2019 08:20 AM    MCV 74.1 (L) 11/26/2019 08:20 AM    MCHC 32.2 (L) 11/26/2019 08:20 AM    MCH 23.8 (L) 11/26/2019 08:20 AM    RDW 18.9 (H) 11/26/2019 08:20 AM    MPV 7.0 11/26/2019 08:20 AM    Lab Results   Component Value Date/Time    PMNS 66 11/26/2019 08:20 AM    LYMPHOCYTES 14 11/26/2019 08:20 AM    EOSINOPHIL 4 11/26/2019 08:20 AM    MONOCYTES 15 11/26/2019 08:20 AM    BASOPHILS 1 11/26/2019 08:20 AM    BASOPHILS 0.10 11/26/2019 08:20 AM    PMNABS 4.10 11/26/2019 08:20 AM    LYMPHSABS 0.90 11/26/2019 08:20 AM    EOSABS 0.20 11/26/2019 08:20 AM    MONOSABS 0.90 11/26/2019 08:20 AM    BASOSABS 0.10 11/10/2014 02:28 PM    BASABS 0.01 09/17/2018 08:24 AM        COMPREHENSIVE METABOLIC PANEL  Lab Results   Component Value Date    SODIUM 136 11/26/2019    POTASSIUM 4.0 11/26/2019    CHLORIDE 108 11/26/2019    CO2 22 11/26/2019    ANIONGAP 6 11/26/2019    BUN 10 11/26/2019    CREATININE 0.81 11/26/2019    GLUCOSE Negative 09/24/2018    CALCIUM 8.4 (L) 11/26/2019    PHOSPHORUS 3.2 03/12/2019    ALBUMIN 3.5 11/26/2019    TOTALPROTEIN 6.0 11/26/2019    ALKPHOS 83 11/26/2019    AST 23 11/26/2019    ALT 24 11/26/2019    BILIRUBINCON 0.2 03/22/2018     THYROID STIMULATING HORMONE  Lab Results   Component Value Date    TSH 5.614 (H) 11/26/2019         ASSESSMENT AND PLAN:  69 y.o. female with history  of  stage III colon cancer status post 5 cycle of FOLFOX now presenting with stage III lung cancer.      ICD-10-CM    1. Encounter for antineoplastic chemotherapy  Z51.11    2. Malignant neoplasm of lung, unspecified laterality, unspecified part of lung (CMS HCC)  C34.90    3. Hypothyroidism, unspecified type  E03.9    4. Cancer related pain  G89.3        1. Adenocarcinoma lung:  Stage III.    CT brain to rule out metastatic disease.  NEGATIVE.   Started on chemoradiation carboplatin/Taxol along with radiation- July 23, 2018.  After completion of chemo radiation patient will get PET scan.  If continued to have good response will start maintenance immunotherapy for 1 year.   Completed 6/6 cycles of Taxol/Carboplatin and will start maintenance immunotherapy therapy following radiation therapy and PET scan.     CHEMOTHERAPY:/RADIATION  CYCLE 18/26 DAY   DRUG DOSE TOTAL DOSE % ROUTE SCHEDULE   Carboplatin AUC- 2 205 mg    COMPLETED   Paclitaxel 45 mg/m2 108 mg    COMPLETED   Durvalumab     Q 2 wks                2. Neutropenia:  Due to chemotherapy.   Resolved.    3. CINV    Zofran PRN    4. Transaminitis: RESOLVED   Instructed to avoid tylenol/alcoholic beverages    5. Thrombocytopenia:  Resolved    6. Dyspnea/fevers: RESOLVED  Better     7. Hypokalemia   Patient on K+ at home   Potassium today 4.0    8. Pain   Patient reports chronic in nature.   Oxycodone every 6 hours     9. Hypothyroidism   Elevated TSH - TSH today 25.6.  Continue current dose of Synthroid.    10. Transaminitis:  LFTs back to normal.    Return in about 2 weeks (around 12/10/2019) for cbc/diff, CMP, Treatment.    Vanetta Mulders, MD  Hematology/Oncology

## 2019-11-30 ENCOUNTER — Inpatient Hospital Stay
Admission: EM | Admit: 2019-11-30 | Discharge: 2019-12-03 | DRG: 178 | Disposition: A | Discharge status: Home / Self Care | Payer: Medicare Other | Attending: Emergency Medicine | Admitting: Emergency Medicine

## 2019-11-30 ENCOUNTER — Emergency Department (HOSPITAL_COMMUNITY): Payer: Medicare Other

## 2019-11-30 ENCOUNTER — Inpatient Hospital Stay (HOSPITAL_COMMUNITY): Payer: Medicare Other | Admitting: Adolescent Medicine

## 2019-11-30 ENCOUNTER — Other Ambulatory Visit: Payer: Self-pay

## 2019-11-30 DIAGNOSIS — Z79899 Other long term (current) drug therapy: Secondary | ICD-10-CM

## 2019-11-30 DIAGNOSIS — G893 Neoplasm related pain (acute) (chronic): Secondary | ICD-10-CM

## 2019-11-30 DIAGNOSIS — J9611 Chronic respiratory failure with hypoxia: Secondary | ICD-10-CM | POA: Diagnosis present

## 2019-11-30 DIAGNOSIS — J181 Lobar pneumonia, unspecified organism: Secondary | ICD-10-CM

## 2019-11-30 DIAGNOSIS — Z79890 Hormone replacement therapy (postmenopausal): Secondary | ICD-10-CM

## 2019-11-30 DIAGNOSIS — Z20822 Contact with and (suspected) exposure to covid-19: Secondary | ICD-10-CM | POA: Diagnosis present

## 2019-11-30 DIAGNOSIS — G4733 Obstructive sleep apnea (adult) (pediatric): Secondary | ICD-10-CM | POA: Diagnosis present

## 2019-11-30 DIAGNOSIS — J189 Pneumonia, unspecified organism: Secondary | ICD-10-CM | POA: Diagnosis present

## 2019-11-30 DIAGNOSIS — Z7951 Long term (current) use of inhaled steroids: Secondary | ICD-10-CM

## 2019-11-30 DIAGNOSIS — Z87891 Personal history of nicotine dependence: Secondary | ICD-10-CM

## 2019-11-30 DIAGNOSIS — R000 Tachycardia, unspecified: Secondary | ICD-10-CM

## 2019-11-30 DIAGNOSIS — I480 Paroxysmal atrial fibrillation: Secondary | ICD-10-CM | POA: Diagnosis present

## 2019-11-30 DIAGNOSIS — C3490 Malignant neoplasm of unspecified part of unspecified bronchus or lung: Secondary | ICD-10-CM

## 2019-11-30 DIAGNOSIS — J440 Chronic obstructive pulmonary disease with acute lower respiratory infection: Secondary | ICD-10-CM | POA: Diagnosis present

## 2019-11-30 DIAGNOSIS — I4891 Unspecified atrial fibrillation: Secondary | ICD-10-CM

## 2019-11-30 DIAGNOSIS — J449 Chronic obstructive pulmonary disease, unspecified: Secondary | ICD-10-CM | POA: Diagnosis present

## 2019-11-30 DIAGNOSIS — Z6841 Body Mass Index (BMI) 40.0 and over, adult: Secondary | ICD-10-CM

## 2019-11-30 DIAGNOSIS — I10 Essential (primary) hypertension: Secondary | ICD-10-CM | POA: Diagnosis present

## 2019-11-30 DIAGNOSIS — E039 Hypothyroidism, unspecified: Secondary | ICD-10-CM | POA: Diagnosis present

## 2019-11-30 DIAGNOSIS — Z85038 Personal history of other malignant neoplasm of large intestine: Secondary | ICD-10-CM

## 2019-11-30 DIAGNOSIS — E785 Hyperlipidemia, unspecified: Secondary | ICD-10-CM | POA: Diagnosis present

## 2019-11-30 DIAGNOSIS — Z5111 Encounter for antineoplastic chemotherapy: Secondary | ICD-10-CM

## 2019-11-30 DIAGNOSIS — C3431 Malignant neoplasm of lower lobe, right bronchus or lung: Secondary | ICD-10-CM | POA: Diagnosis present

## 2019-11-30 DIAGNOSIS — J150 Pneumonia due to Klebsiella pneumoniae: Principal | ICD-10-CM | POA: Diagnosis present

## 2019-11-30 DIAGNOSIS — Z7901 Long term (current) use of anticoagulants: Secondary | ICD-10-CM

## 2019-11-30 DIAGNOSIS — J14 Pneumonia due to Hemophilus influenzae: Secondary | ICD-10-CM | POA: Diagnosis present

## 2019-11-30 LAB — BASIC METABOLIC PANEL
ANION GAP: 7 mmol/L
BUN/CREA RATIO: 16
BUN: 12 mg/dL (ref 10–25)
CALCIUM: 8.6 mg/dL — ABNORMAL LOW (ref 8.8–10.3)
CHLORIDE: 106 mmol/L (ref 98–111)
CO2 TOTAL: 26 mmol/L (ref 21–35)
CREATININE: 0.74 mg/dL (ref <=1.30)
ESTIMATED GFR: >60 mL/min/{1.73_m2}
GLUCOSE: 122 mg/dL — ABNORMAL HIGH (ref 70–110)
POTASSIUM: 4.2 mmol/L (ref 3.5–5.0)
SODIUM: 139 mmol/L (ref 135–145)

## 2019-11-30 LAB — CBC WITH DIFF
BASOPHIL #: 0 10*3/uL (ref 0.00–0.20)
BASOPHIL %: 1 %
EOSINOPHIL #: 0.2 10*3/uL (ref 0.00–0.50)
EOSINOPHIL %: 2 %
HCT: 37.7 % (ref 34.6–46.2)
HGB: 12.1 g/dL (ref 11.8–15.8)
LYMPHOCYTE #: 0.9 10*3/uL (ref 0.90–3.40)
LYMPHOCYTE %: 12 %
MCH: 23.5 pg — ABNORMAL LOW (ref 27.6–33.2)
MCHC: 32.1 g/dL — ABNORMAL LOW (ref 32.6–35.4)
MCV: 73.3 fL — ABNORMAL LOW (ref 82.3–96.7)
MONOCYTE #: 1.1 10*3/uL — ABNORMAL HIGH (ref 0.20–0.90)
MONOCYTE %: 15 %
MPV: 7.1 fL (ref 6.6–10.2)
NEUTROPHIL #: 5 10*3/uL (ref 1.50–6.40)
NEUTROPHIL %: 70 %
PLATELETS: 318 10*3/uL (ref 140–440)
RBC: 5.15 10*6/uL (ref 3.80–5.24)
RDW: 18.4 % — ABNORMAL HIGH (ref 12.4–15.2)
WBC: 7.1 10*3/uL (ref 3.5–10.3)

## 2019-11-30 LAB — TROPONIN-I: TROPONIN I: <0.03 ng/mL (ref <=0.04)

## 2019-11-30 LAB — B-TYPE NATRIURETIC PEPTIDE: BNP: 107 pg/mL — ABNORMAL HIGH (ref 0–100)

## 2019-11-30 LAB — PROCALCITONIN: PROCALCITONIN: <0.05 ng/mL (ref <0.50)

## 2019-11-30 LAB — PTT (PARTIAL THROMBOPLASTIN TIME): APTT: 66.7 seconds — ABNORMAL HIGH (ref 25.0–37.0)

## 2019-11-30 LAB — VENOUS BLOOD GAS
%FIO2 (VENOUS): 28 %
BASE DEFICIT: 0.5 mmol/L (ref <=3.0)
BICARBONATE (VENOUS): 24 mmol/L (ref 23.0–33.0)
O2 SATURATION (VENOUS): 71.4 % — ABNORMAL LOW (ref 95.0–98.0)
PCO2 (VENOUS): 39 mm/Hg (ref 38.00–50.00)
PH (VENOUS): 7.4 (ref 7.32–7.45)
PO2 (VENOUS): 41 mm/Hg (ref 30.0–50.0)

## 2019-11-30 LAB — PT/INR: INR: 1.15 — ABNORMAL HIGH (ref 0.80–1.10)

## 2019-11-30 MED ORDER — FLECAINIDE 100 MG TABLET
100.00 mg | ORAL_TABLET | Freq: Two times a day (BID) | ORAL | Status: DC
Start: 2019-12-01 — End: 2019-12-03
  Administered 2019-12-01 – 2019-12-03 (×6): 100 mg via ORAL
  Filled 2019-11-30 (×9): qty 1

## 2019-11-30 MED ORDER — ONDANSETRON HCL (PF) 4 MG/2 ML INJECTION SOLUTION
4.00 mg | Freq: Four times a day (QID) | INTRAMUSCULAR | Status: DC | PRN
Start: 2019-11-30 — End: 2019-12-03

## 2019-11-30 MED ORDER — SODIUM CHLORIDE 0.9 % (FLUSH) INJECTION SYRINGE
3.00 mL | INJECTION | Freq: Three times a day (TID) | INTRAMUSCULAR | Status: DC
Start: 2019-12-01 — End: 2019-12-03
  Administered 2019-12-01: 0 mL
  Administered 2019-12-01 (×3): 3 mL
  Administered 2019-12-02 (×2): 0 mL
  Administered 2019-12-02: 3 mL
  Administered 2019-12-03: 0 mL
  Administered 2019-12-03: 3 mL

## 2019-11-30 MED ORDER — OXYCODONE 5 MG TABLET
5.00 mg | ORAL_TABLET | Freq: Four times a day (QID) | ORAL | Status: DC | PRN
Start: 2019-11-30 — End: 2019-12-03
  Administered 2019-12-02 (×2): 5 mg via ORAL
  Filled 2019-11-30 (×2): qty 1

## 2019-11-30 MED ORDER — PANTOPRAZOLE 40 MG TABLET,DELAYED RELEASE
40.00 mg | DELAYED_RELEASE_TABLET | Freq: Every morning | ORAL | Status: DC
Start: 2019-12-01 — End: 2019-12-03
  Administered 2019-12-01 – 2019-12-03 (×3): 40 mg via ORAL
  Filled 2019-11-30 (×5): qty 1

## 2019-11-30 MED ORDER — LOPERAMIDE 2 MG CAPSULE
2.00 mg | ORAL_CAPSULE | ORAL | Status: DC | PRN
Start: 2019-11-30 — End: 2019-12-03
  Filled 2019-11-30 (×2): qty 1

## 2019-11-30 MED ORDER — BUDESONIDE 0.25 MG/2 ML SUSPENSION FOR NEBULIZATION
0.25 mg | INHALATION_SUSPENSION | Freq: Two times a day (BID) | RESPIRATORY_TRACT | Status: DC
Start: 2019-12-01 — End: 2019-12-01
  Administered 2019-12-01 (×2): 0.25 mg via RESPIRATORY_TRACT
  Filled 2019-11-30 (×5): qty 2

## 2019-11-30 MED ORDER — GUAIFENESIN ER 600 MG TABLET, EXTENDED RELEASE 12 HR
600.00 mg | EXTENDED_RELEASE_TABLET | Freq: Two times a day (BID) | ORAL | Status: DC
Start: 2019-12-01 — End: 2019-12-03
  Administered 2019-12-01 – 2019-12-03 (×6): 600 mg via ORAL
  Filled 2019-11-30 (×9): qty 1

## 2019-11-30 MED ORDER — ACETAMINOPHEN 325 MG TABLET
650.00 mg | ORAL_TABLET | Freq: Four times a day (QID) | ORAL | Status: DC | PRN
Start: 2019-11-30 — End: 2019-12-03

## 2019-11-30 MED ORDER — SODIUM CHLORIDE 0.9 % (FLUSH) INJECTION SYRINGE
3.00 mL | INJECTION | INTRAMUSCULAR | Status: DC | PRN
Start: 2019-11-30 — End: 2019-12-03

## 2019-11-30 MED ORDER — SENNOSIDES 8.6 MG-DOCUSATE SODIUM 50 MG TABLET
1.00 | ORAL_TABLET | Freq: Every evening | ORAL | Status: DC | PRN
Start: 2019-11-30 — End: 2019-12-03
  Filled 2019-11-30: qty 1

## 2019-11-30 MED ORDER — POTASSIUM CHLORIDE ER 20 MEQ TABLET,EXTENDED RELEASE(PART/CRYST)
20.00 meq | ORAL_TABLET | Freq: Every day | ORAL | Status: DC
Start: 2019-12-01 — End: 2019-12-03
  Administered 2019-12-01 – 2019-12-03 (×3): 20 meq via ORAL
  Filled 2019-11-30 (×4): qty 1

## 2019-11-30 MED ORDER — ROPINIROLE 0.5 MG TABLET
0.50 mg | ORAL_TABLET | Freq: Every evening | ORAL | Status: DC
Start: 2019-12-01 — End: 2019-12-03
  Administered 2019-12-01 – 2019-12-02 (×3): 0.5 mg via ORAL
  Filled 2019-11-30 (×5): qty 1

## 2019-11-30 MED ORDER — LORATADINE 10 MG TABLET
10.00 mg | ORAL_TABLET | Freq: Every day | ORAL | Status: DC
Start: 2019-12-01 — End: 2019-12-03
  Administered 2019-12-01 – 2019-12-03 (×3): 10 mg via ORAL
  Filled 2019-11-30 (×4): qty 1

## 2019-11-30 MED ORDER — AMPICILLIN-SULBACTAM 3G IN NS 100ML MINIBAG IVPB
3.00 g | Freq: Four times a day (QID) | INTRAMUSCULAR | Status: DC
Start: 2019-12-01 — End: 2019-12-03
  Administered 2019-12-01: 3 g via INTRAVENOUS
  Administered 2019-12-01 (×2): 0 g via INTRAVENOUS
  Administered 2019-12-01 (×3): 3 g via INTRAVENOUS
  Administered 2019-12-01 (×2): 0 g via INTRAVENOUS
  Administered 2019-12-02 (×2): 3 g via INTRAVENOUS
  Administered 2019-12-02: 06:00:00 0 g via INTRAVENOUS
  Administered 2019-12-02: 01:00:00 3 g via INTRAVENOUS
  Administered 2019-12-02 (×3): 0 g via INTRAVENOUS
  Administered 2019-12-02: 16:00:00 3 g via INTRAVENOUS
  Administered 2019-12-03 (×3): 0 g via INTRAVENOUS
  Administered 2019-12-03 (×3): 3 g via INTRAVENOUS
  Administered 2019-12-03: 0 g via INTRAVENOUS
  Filled 2019-11-30 (×15): qty 100

## 2019-11-30 MED ORDER — LISINOPRIL 20 MG TABLET
20.00 mg | ORAL_TABLET | Freq: Every day | ORAL | Status: DC
Start: 2019-12-01 — End: 2019-12-03
  Administered 2019-12-01 – 2019-12-02 (×2): 20 mg via ORAL
  Administered 2019-12-03: 0 mg via ORAL
  Filled 2019-11-30 (×4): qty 1

## 2019-11-30 MED ORDER — AMPICILLIN-SULBACTAM 3G IN NS 100ML MINIBAG IVPB
3.00 g | INTRAMUSCULAR | Status: AC
Start: 2019-11-30 — End: 2019-11-30
  Administered 2019-11-30: 0 g via INTRAVENOUS
  Administered 2019-11-30: 3 g via INTRAVENOUS
  Filled 2019-11-30: qty 100

## 2019-11-30 MED ORDER — AZITHROMYCIN 250 MG TABLET
500.00 mg | ORAL_TABLET | Freq: Every day | ORAL | Status: DC
Start: 2019-11-30 — End: 2019-12-03
  Administered 2019-11-30: 500 mg via ORAL
  Administered 2019-12-01: 0 mg via ORAL
  Administered 2019-12-02 – 2019-12-03 (×2): 500 mg via ORAL
  Filled 2019-11-30 (×5): qty 2

## 2019-11-30 MED ORDER — ATORVASTATIN 10 MG TABLET
10.00 mg | ORAL_TABLET | Freq: Every day | ORAL | Status: DC
Start: 2019-12-01 — End: 2019-12-03
  Administered 2019-12-01 – 2019-12-03 (×3): 10 mg via ORAL
  Filled 2019-11-30 (×4): qty 1

## 2019-11-30 MED ORDER — CARVEDILOL 3.125 MG TABLET
3.1250 mg | ORAL_TABLET | Freq: Two times a day (BID) | ORAL | Status: DC
Start: 2019-12-01 — End: 2019-12-03
  Administered 2019-12-01 – 2019-12-03 (×5): 3.125 mg via ORAL
  Administered 2019-12-03: 08:00:00 0 mg via ORAL
  Filled 2019-11-30 (×8): qty 1

## 2019-11-30 MED ORDER — LEVOTHYROXINE 112 MCG TABLET
112.00 ug | ORAL_TABLET | Freq: Every morning | ORAL | Status: DC
Start: 2019-12-01 — End: 2019-12-03
  Administered 2019-12-01 – 2019-12-03 (×3): 112 ug via ORAL
  Filled 2019-11-30 (×5): qty 1

## 2019-11-30 MED ORDER — METHYLPREDNISOLONE SOD SUCCINATE 40 MG/ML SOLUTION FOR INJ. WRAPPER
40.00 mg | Freq: Two times a day (BID) | INTRAMUSCULAR | Status: DC
Start: 2019-12-01 — End: 2019-12-01
  Administered 2019-12-01: 40 mg via INTRAVENOUS
  Filled 2019-11-30 (×5): qty 1

## 2019-11-30 MED ORDER — APIXABAN 5 MG TABLET
5.00 mg | ORAL_TABLET | Freq: Two times a day (BID) | ORAL | Status: DC
Start: 2019-12-01 — End: 2019-12-03
  Administered 2019-12-01 – 2019-12-03 (×6): 5 mg via ORAL
  Filled 2019-11-30 (×9): qty 1

## 2019-11-30 MED ORDER — DOCUSATE SODIUM 100 MG CAPSULE
100.00 mg | ORAL_CAPSULE | Freq: Three times a day (TID) | ORAL | Status: DC | PRN
Start: 2019-11-30 — End: 2019-12-03

## 2019-11-30 MED ORDER — ENOXAPARIN 40 MG/0.4 ML SUBCUTANEOUS SYRINGE
40.00 mg | INJECTION | SUBCUTANEOUS | Status: DC
Start: 2019-12-01 — End: 2019-11-30

## 2019-11-30 MED ORDER — LINACLOTIDE 72 MCG CAPSULE
72.00 ug | ORAL_CAPSULE | Freq: Every morning | ORAL | Status: DC
Start: 2019-12-01 — End: 2019-12-03
  Administered 2019-12-01 – 2019-12-03 (×3): 72 ug via ORAL
  Filled 2019-11-30 (×5): qty 1

## 2019-11-30 MED ORDER — IPRATROPIUM 0.5 MG-ALBUTEROL 3 MG (2.5 MG BASE)/3 ML NEBULIZATION SOLN
3.00 mL | INHALATION_SOLUTION | Freq: Four times a day (QID) | RESPIRATORY_TRACT | Status: DC
Start: 2019-12-01 — End: 2019-12-01
  Administered 2019-12-01 (×2): 3 mL via RESPIRATORY_TRACT
  Filled 2019-11-30 (×8): qty 3

## 2019-11-30 NOTE — Progress Notes (Signed)
He would call from Dr. Daymon Larsen in the emergency department.  After discussion with patient she did consent to have a COVID swab completed and it was done at this time.  Would like to be admitted and treated for her pneumonia at this time.  Patient will be admitted to the hospitalist service.

## 2019-11-30 NOTE — ED Attending Handoff Note (Signed)
69 yr female worsening shortness of breath with known lung cancer  CT non-contrast due to allergy pending  Likely pneumonia  Anticipate admission    CT CHEST WO IV CONTRAST   Final Result   1. New heterogeneous airspace disease in the left lung mostly in the left   lower lobe. This is suspicious for pneumonia.   2. Improved appearance of previous bilateral upper lobe opacities.   3. Stable right hilar prominence which may be from postradiation change.         Radiologist location ID: [id number]         XR AP MOBILE CHEST   Final Result   1. Stable right hilar mass which may represent underlying malignancy.   2. Stable right basilar pleural effusion and/or atelectasis versus   scarring.   3. Developing infiltrate involving the lingual segment of the left upper   lobe of the lung.                        Radiologist location ID: [id number]           Hospitalist service consulted agrees to admit the patient

## 2019-11-30 NOTE — H&P (Signed)
Hospitalist  History and Physical    Kelsey, [name]  Date of Admission:  11/30/2019  Date of Birth:  02-23-1951    PCP: [facility]  Chief Complaint:    Chief Complaint   Patient presents with    Shortness of Breath     C/O Worsening SOB x 1 month       HPI: Kelsey [name] is a 69 y.o., White female who presents with complaints of increased shortness of breath for the past 4-5 months with the rapid increase in her shortness of breath over the past 1 month.  Patient states she is unable to walk this base of 1 room without sitting due to increased shortness of breath.  Patient states I feel like I am going to die because I can not breathe well and .  Denies any fever, chills, chest pain, dysuria, or any other symptoms.  Patient does have history of lung cancer and follows with Dr. Deatra Canter [facility].  Patient has completed 6 cycles of chemotherapy of carboplatin and paclitaxel, and is currently receiving immunotherapy with durvalumab, last dosage being on the 7th.  Although patient is short of breath, she has not been hypoxic in the emergency department.  Has been run in the high 90s on room air.  States she also has COPD and needs to be established with a pulmonologist although she has not been able to get into 1 quite yet.  Other past medical history includes colon cancer, hypertension, hyperlipidemia, and atrial fibrillation.  Patient follows with Dr. church of [facility].  Is currently on flecainide.  CT the chest without IV contrast shows new heterogeneous airspace disease in the left lung mostly in the left lower lobe.  This is suspicious for pneumonia.  Improved appearance of previous bilateral upper lobe opacities.  Stable right hilar prominence which may be from postradiation change.  Patient was started on azithromycin and Unasyn in the emergency department.   Patient is going to be admitted at this time due to pneumonia and is going to receive pulmonology consultation, Oncology consultation, breathing treatments, and continuation of IV antibiotics.        Past Medical History:   Diagnosis Date    A-fib (CMS HCC)     Arthropathy, unspecified, site unspecified     Asthma     Cancer (CMS [place])     cervical    Cataract     Colon cancer (CMS [place]) 07/06/2014    COPD (chronic obstructive pulmonary disease) (CMS HCC)     Fistula     HTN (hypertension)     Hyperlipidemia     Lung mass     with pleural effusion    Obesity     Sleep apnea     Ventral hernia            Past Surgical History:   Procedure Laterality Date    ABSCESS DRAINAGE      BOWEL RESECTION      CATARACT EXTRACTION      HX CERVICAL CONE BIOPSY       HX CESAREAN SECTION      HX GASTRIC BYPASS      25 years ago    [place] (x2)    HX HYSTERECTOMY      late 34s    HX LAP BANDING      2013    HX LAPAROTOMY      2013 to  remove lap band           Medications Prior to Admission       Prescriptions    albuterol sulfate (PROVENTIL OR VENTOLIN OR PROAIR) 90 mcg/actuation Inhalation HFA Aerosol Inhaler    INHALE 1 TO 2 PUFFS BY MOUTH EVERY 6 HOURS AS NEEDED    albuterol sulfate (PROVENTIL) 2.5 mg /3 mL (0.083 %) Inhalation Solution for Nebulization    3 mL (2.5 mg total) by Nebulization route Every 6 hours    apixaban (ELIQUIS) 5 mg Oral Tablet    Take 1 Tab (5 mg total) by mouth Twice daily    atorvastatin (LIPITOR) 10 mg Oral Tablet    Take 10 mg by mouth Once a day    budesonide-formoteroL (SYMBICORT) 160-4.5 mcg/actuation Inhalation HFA Aerosol Inhaler    Take 2 Puffs by inhalation Twice daily    carvediloL (COREG) 3.125 mg Oral Tablet    Take 3.125 mg by mouth Twice daily with food    docusate sodium (COLACE) 100 mg Oral Capsule    Take 100 mg by mouth Three times a day as needed     flecainide (TAMBOCOR) 100 mg Oral Tablet    Take 1 Tab (100 mg total) by mouth Twice daily    fluticasone  propionate (FLONASE) 50 mcg/actuation Nasal Spray, Suspension    1 Spray by Each Nostril route Once a day    levothyroxine (SYNTHROID) 112 mcg Oral Tablet    Take 1 Tablet (112 mcg total) by mouth Every morning    linaCLOtide (LINZESS) 72 mcg Oral Capsule    Take 72 mcg by mouth Every morning    lisinopriL (PRINIVIL) 20 mg Oral Tablet    Take 20 mg by mouth Once a day    loperamide (IMODIUM) 2 mg Oral Capsule    Take 2 mg by mouth Every 4 hours as needed    loratadine (CLARITIN) 10 mg Oral Tablet    TAKE 1 TABLET BY MOUTH EVERY DAY    magnesium oxide (MAG-OX) 400 mg Oral Tablet    Take 400 mg by mouth Once a day    omeprazole (PRILOSEC) 20 mg Oral Capsule, Delayed Release(E.C.)    Take 40 mg by mouth Once a day     oxyCODONE (ROXICODONE) 5 mg Oral Tablet    Take 1 Tablet (5 mg total) by mouth Every 6 hours as needed for Pain    potassium chloride (K-DUR) 20 mEq Oral Tab Sust.Rel. Particle/Crystal    Take 20 mEq by mouth Once a day Takes 3 days a week    prochlorperazine (COMPAZINE) 10 mg Oral Tablet    Take 1 Tab (10 mg total) by mouth Four times a day as needed for Nausea/Vomiting    rOPINIRole (REQUIP) 0.5 mg Oral Tablet    Take 0.5 mg by mouth Every night            Allergies   Allergen Reactions    Shellfish Containing Products Shortness of Breath, Rash and Itching     scallops    Vancomycin Shortness of Breath    Barium Iodide     Iodine And Iodide Containing Products      Itching, shortness of breath        Social History     Tobacco Use    Smoking status: Former Smoker    Smokeless tobacco: Never Used    Tobacco comment: quit 10 yrs ago   Substance Use Topics    Alcohol use: Not  Currently     Comment: rarely       Family History:   Family Medical History:       Problem Relation (Age of Onset)    Diabetes Mother, Maternal Grandmother, Maternal Grandfather, Father    Heart Attack Mother    Stroke Father        ROS:   Constitutional: No fever, chills or weakness   Skin: No rash or diaphoresis  HENT: No headaches  or congestion  Eyes: No vision changes   Cardio: No chest pain, palpitations or leg swelling   Respiratory: No cough, wheezing + SOB  GI:  No nausea, vomiting or stool changes  GU:  No urinary changes  MSK: No joint or back pain  Neuro: No seizures or LOC  Psychiatric: No depression, SI or substance abuse  All other systems reviewed and are negative.    EXAM:  Vitals: BP (!) 118/93    Pulse (!) 110    Temp 36 C (96.8 F)    Resp (!) 22    Ht 1.575 m (5\' 2" )    Wt 114 kg (250 lb 4.8 oz)    SpO2 95%    BMI 45.78 kg/m       Nursing note and vitals reviewed.   Constitutional: Patient is an obese, chronically ill-appearing female in no acute distress.    HEENT: Head normocephalic and atraumatic.   Eyes: PERRL, conjunctiva is without erythema or discharge.  Lungs:  Diminished lung sounds to right lower lobe.  Left lower lobe with rhonchi.  Scattered wheezes noted throughout bilateral upper lobes.  Cardiovascular:  Tachycardic. No murmur, rub, or gallop. Pulses strong and equal bilaterally.  Abdomen: Normal bowel sounds. Soft, nontender, nondistended. No rebound, Guarding, or masses noted.   MSK/Extremities: Moves all extremities. 5/5 strength throughout. Pulses equal bilaterally.  Skin: No lesions noted.  No rashes.  Neuro: Normal sensation and strength bilaterally.CNs 2-12 grossly intact. No facial droop noted.    Psych: Alert and oriented to person, place, and time. Normal affect    Labs:    CBC  Diff   Lab Results   Component Value Date/Time    WBC 7.1 11/30/2019 05:20 PM    HGB 12.1 11/30/2019 05:20 PM    HCT 37.7 11/30/2019 05:20 PM    PLTCNT 318 11/30/2019 05:20 PM    RBC 5.15 11/30/2019 05:20 PM    MCV 73.3 (L) 11/30/2019 05:20 PM    MCHC 32.1 (L) 11/30/2019 05:20 PM    MCH 23.5 (L) 11/30/2019 05:20 PM    RDW 18.4 (H) 11/30/2019 05:20 PM    MPV 7.1 11/30/2019 05:20 PM    Lab Results   Component Value Date/Time    PMNS 70 11/30/2019 05:20 PM    LYMPHOCYTES 12 11/30/2019 05:20 PM    EOSINOPHIL 2 11/30/2019 05:20 PM     MONOCYTES 15 11/30/2019 05:20 PM    BASOPHILS 1 11/30/2019 05:20 PM    BASOPHILS 0.00 11/30/2019 05:20 PM    PMNABS 5.00 11/30/2019 05:20 PM    LYMPHSABS 0.90 11/30/2019 05:20 PM    EOSABS 0.20 11/30/2019 05:20 PM    MONOSABS 1.10 (H) 11/30/2019 05:20 PM    BASOSABS 0.10 11/10/2014 02:28 PM    BASABS 0.01 09/17/2018 08:24 AM        Basic Metabolic Panel  Lab Results   Component Value Date    SODIUM 139 11/30/2019    POTASSIUM 4.2 11/30/2019    CHLORIDE 106 11/30/2019  CO2 26 11/30/2019    ANIONGAP 7 11/30/2019    BUN 12 11/30/2019    CREATININE 0.74 11/30/2019    BUNCRRATIO 16 11/30/2019    GFR >60 11/30/2019    CALCIUM 8.6 (L) 11/30/2019    GLUCOSENF 122 (H) 11/30/2019      Lab Results   Component Value Date    INR 1.15 (H) 11/30/2019     Micro: No results found for any visits on 11/30/19 (from the past 96 hour(s)).    Radiology:    Results for orders placed or performed during the hospital encounter of 11/30/19 (from the past 24 hour(s))   XR AP MOBILE CHEST     Status: None    Narrative    Nara Rebstock    PROCEDURE DESCRIPTION: XR AP MOBILE CHEST    CLINICAL INDICATION: SOB    TECHNIQUE: 1 views / 1 images submitted.    COMPARISON: 10/22/2019.      FINDINGS:   There is a stable right-sided subclavian MediPort catheter in place. There  is stable prominence of the right hilum which may represent underlying  malignancy in the appropriate clinical setting. The cardiac silhouette is  not enlarged. There is no pneumothorax. There is a stable right basilar  pleural effusion and/or atelectasis. There is a developing infiltrate in  the lingula segment of the left upper lobe of the lungs.      Impression    1. Stable right hilar mass which may represent underlying malignancy.  2. Stable right basilar pleural effusion and/or atelectasis versus  scarring.  3. Developing infiltrate involving the lingual segment of the left upper  lobe of the lung.                Radiologist location ID: [id number]     CT CHEST WO IV  CONTRAST     Status: None    Narrative    Shenia Laabs    PROCEDURE DESCRIPTION: CT CHEST WO IV CONTRAST    CLINICAL INDICATION: hx of lung cancer, here for shortness of breath    The CT exam was performed using one or more of the following dose reduction  techniques: automated exposure control, adjustment of the mA and/or kV  according to the patient's size, or use of iterative reconstruction  technique.    Chest x-ray has been performed earlier today. Previous CT chest was  performed on 10/29/19.    FINDINGS:  There is a stable appearing right perihilar opacity which may represent  postradiation change. Additional underlying malignancy is not excluded  although this has not progressed. There is new heterogeneous airspace  disease throughout the left lung mostly in the left lower lobe suspicious  for worsening pneumonia. There has been partial clearing of previous  bilateral upper lobe opacities. There is a stable small right pleural  effusion. Small mediastinal lymph nodes are stable. Calcified mediastinal  nodes are present. Right-sided port is again seen. There are mild  degenerative changes in the thoracic spine. Sternum and ribs demonstrate no  acute abnormality.      Impression    1. New heterogeneous airspace disease in the left lung mostly in the left  lower lobe. This is suspicious for pneumonia.  2. Improved appearance of previous bilateral upper lobe opacities.  3. Stable right hilar prominence which may be from postradiation change.      Radiologist location ID: [id number]         Assessment/ Plan:   Active Hospital Problems  Diagnosis    Pneumonia     Pneumonia of left lower lobe   -Unasyn and azithromycin-1st dose given emergency department  -will have pulmonology to consult this patient has history of COPD and does not currently follow with pulmonology  -Mucinex b.i.d.  -Pulmicort b.i.d., DuoNeb aerosols 4 times daily  -Solu-Medrol b.i.d.    Lung cancer  -oxycodone q.6 hours p.r.n.  -oncology  consultation placed    COPD  -continue breathing treatments as ordered    Atrial fibrillation  -telemetry monitoring  -flecainide b.i.d.  -apixaban b.i.d.    Hypertension  -Coreg b.i.d.  -lisinopril 20 mg q.day    Hyperlipidemia  -Lipitor 10 mg q.day    Hypothyroidism  -Synthroid per home regimen    Morbid obesity  -patient's BMI is 45.7 a upon admission    History of colon cancer      DVT/PE Prophylaxis: Apixaban  DNR Status:  Full Code    Brent Bulla, NP  11/30/2019, 23:38    Patient seen and examined at bedside with the Nurse Practitioner. I have reviewed the documentation, assessment and plan as above and agree.      Franchot Erichsen, MD  12/01/2019, 02:29

## 2019-11-30 NOTE — ED Nurses Note (Signed)
Ambulatory pulse ox: 93% on RA, 101bpm  Ardelle Park, RN  11/30/2019, 99:87

## 2019-11-30 NOTE — ED Nurses Note (Signed)
Upon entry to room, patient reports that [facility] was applied in triage and that she is present to be admitted for new O2 need, x1 month. Patient reports that she spoke of concerns with oncologist (for lung CA dx, in remission currently) and was told that per her lab work, she was perfusing well and did not need supplemental O2 at home, had 2LNC applied in triage, and needs to be admitted for supplemental O2. Patient reports needing dinner tray at this time including beverage. Advised patient that she is to remain NPO until testing concludes to ensure no emergent procedure/surgery needs to take place. Patient verbally abusive to staff and reports that she will have dinner tray and will not have procedures. Patient reports, "I'm only here to be admitted for my new oxygen that I need." Encouraged patient to remain NPO per protocol.  Carney Living, RN  11/30/2019, 17:04
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
## 2019-11-30 NOTE — ED Attending Note (Signed)
Kelsey [name] was seen in the [facility] Emergency Department on encounter date 11/30/2019    I saw and examined the patient. I reviewed the resident's note. I agree with the findings and plan of care as documented in the resident's note. Any exceptions/additions are edited/noted.    Triage chief complaint: "Shortness of Breath (C/O Worsening SOB x 1 month)"    HPI :   Kelsey [name] is a 69 y.o. female with h/o lung cancer on chemotherapy and Paroxysmal atrial fibrillation who presents to the ED for evaluation of worsening dyspnea on exertion.  No chest pain.      PE :   VS on presentation: Temperature: 36 C (96.8 F)  Heart Rate: (!) 102  Respiratory Rate: 20  BP (Non-Invasive): (!) 182/90  SpO2: 94 %    Vitals reviewed by me.   Exam per resident note.      ED Course and MDM :     Workup and ED treatments listed in chart / MAR.  I reviewed all lab results, images, EKGs and ancillary test reports from this visit in Epic EMR.   Plan per resident note.  Agree with plan.     ED Course as of Dec 18 919   Mon Nov 30, 2019   1903 PROCALCITONIN, SERUM: <0.05 [MS]   1904 WBC: 7.1 [MS]   1906 B-TYPE NATRIURETIC PEPTIDE(!): 107 [MS]   1906 TROPONIN-I: <0.03 [MS]   1906 Chronic findings plus possible LUL infiltrate   XR AP MOBILE CHEST [MS]      ED Course User Index  [MS] Chryl Heck, MD          Impression :    Encounter Diagnoses   Name Primary?    Pneumonia Yes          Disposition:  Admitted    A portion of this documentation may have been generated using MMODAL voice recognition software and thus may contain syntax/voice recognition errors.     Chryl Heck, MD

## 2019-11-30 NOTE — ED Provider Notes (Signed)
Emergency Department  Provider Note    Name: Kelsey [name]  Age and Gender: 69 y.o. female  PCP: [facility]  Attending: Dr. Rosiland Oz    Triage Note:   Shortness of Breath (C/O Worsening SOB x 1 month)      Clinical Impression:     Encounter Diagnosis   Name Primary?    Pneumonia Yes       --------------------------------------------------------------------------------------------------------------------------------  HPI:  Kelsey [name] is a 69 y.o. female  who presents to the ED today for progressive dyspnea on exertion that has been ongoing for the past 6 months. The patient states that her symptoms have recently gotten worse over the past month. Of note, the patient has a hx of lung cancer and is currently receiving chemotherapy. The patient reports a hx of Afib, however states that her rate and rhythm are currently controlled. She denies hx of CHF. The patient denies chest pain, fever, or any other complaints at this time.       Review of Systems:  Text in bold indicates (+) findings; all other findings (-)  Constitutional: fever, chills, weakness   Skin: rash, diaphoresis  HENT: headaches, congestion  Eyes: vision changes, photophobia   Cardio: chest pain, palpitations, leg swelling   Respiratory: cough, wheezing, SOB  GI:  nausea, vomiting, diarrhea, constipation, abdominal pain  GU:  dysuria, hematuria, increased frequency  MSK: muscle aches, joint, back pain  Neuro: seizures, confusion, LOC, numbness, tingling, focal weakness  Psychiatric: mood changes, anxiety  All other systems reviewed and are negative.    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     HTN (hypertension)     Hyperlipidemia     Lung mass      with pleural effusion    Obesity     Sleep apnea     Ventral hernia            Current Outpatient Medications   Medication Sig    albuterol sulfate (PROVENTIL OR VENTOLIN OR PROAIR) 90 mcg/actuation Inhalation HFA Aerosol Inhaler INHALE 1 TO 2 PUFFS BY MOUTH EVERY 6 HOURS AS NEEDED    albuterol sulfate (PROVENTIL) 2.5 mg /3 mL (0.083 %) Inhalation Solution for Nebulization 3 mL (2.5 mg total) by Nebulization route Every 6 hours    apixaban (ELIQUIS) 5 mg Oral Tablet Take 1 Tab (5 mg total) by mouth Twice daily    atorvastatin (LIPITOR) 10 mg Oral Tablet Take 10 mg by mouth Once a day    budesonide-formoteroL (SYMBICORT) 160-4.5 mcg/actuation Inhalation HFA Aerosol Inhaler Take 2 Puffs by inhalation Twice daily    carvediloL (COREG) 3.125 mg Oral Tablet Take 3.125 mg by mouth Twice daily with food    docusate sodium (COLACE) 100 mg Oral Capsule Take 100 mg by mouth Three times a day as needed     flecainide (TAMBOCOR) 100 mg Oral Tablet Take 1 Tab (100 mg total) by mouth Twice daily    fluticasone propionate (FLONASE) 50 mcg/actuation Nasal Spray, Suspension 1 Spray by Each Nostril route Once a day    levothyroxine (SYNTHROID) 112 mcg Oral Tablet Take 1 Tablet (  112 mcg total) by mouth Every morning    linaCLOtide (LINZESS) 72 mcg Oral Capsule Take 72 mcg by mouth Every morning    lisinopriL (PRINIVIL) 20 mg Oral Tablet Take 20 mg by mouth Once a day    loperamide (IMODIUM) 2 mg Oral Capsule Take 2 mg by mouth Every 4 hours as needed    loratadine (CLARITIN) 10 mg Oral Tablet TAKE 1 TABLET BY MOUTH EVERY DAY    magnesium oxide (MAG-OX) 400 mg Oral Tablet Take 400 mg by mouth Once a day    omeprazole (PRILOSEC) 20 mg Oral Capsule, Delayed Release(E.C.) Take 40 mg by mouth Once a day     oxyCODONE (ROXICODONE) 5 mg Oral Tablet Take 1 Tablet (5 mg total) by mouth Every 6 hours as needed for Pain    potassium chloride (K-DUR) 20 mEq Oral Tab Sust.Rel. Particle/Crystal Take 20 mEq by mouth Once a day  Takes 3 days a week    prochlorperazine (COMPAZINE) 10 mg Oral Tablet Take 1 Tab (10 mg total) by mouth Four times a day as needed for Nausea/Vomiting    rOPINIRole (REQUIP) 0.5 mg Oral Tablet Take 0.5 mg by mouth Every night       Allergies   Allergen Reactions    Shellfish Containing Products Shortness of Breath, Rash and Itching     scallops    Vancomycin Shortness of Breath    Barium Iodide     Iodine And Iodide Containing Products      Itching, shortness of breath        Past Surgical History:   Procedure Laterality Date    ABSCESS DRAINAGE      BOWEL RESECTION      CATARACT EXTRACTION      HX CERVICAL CONE BIOPSY       HX CESAREAN SECTION      HX GASTRIC BYPASS      25 years ago    [place] (x2)    HX HYSTERECTOMY      late 50s    HX LAP BANDING      2013    HX LAPAROTOMY      2013 to remove lap band           Social History     Occupational History    Not on file   Tobacco Use    Smoking status: Former Smoker    Smokeless tobacco: Never Used    Tobacco comment: quit 10 yrs ago   Substance and Sexual Activity    Alcohol use: Not Currently     Comment: rarely    Drug use: No    Sexual activity: Not on file       Objective:  Nursing notes reviewed    ED Triage Vitals [11/30/19 1331]   BP (Non-Invasive) (!) 182/90   Heart Rate (!) 102   Respiratory Rate 20   Temperature 36 C (96.8 F)   SpO2 94 %   Weight 114 kg (250 lb 4.8 oz)   Height 1.575 m (5' 2" )     Filed Vitals:    11/30/19 2030 11/30/19 2100 11/30/19 2130 11/30/19 2200   BP: (!) 148/87 (!) 125/90 137/71 (!) 145/67   Pulse: 97 100 91 90   Resp: (!) 29 (!) 31 (!) 22 (!) 23   Temp:       SpO2:  90% 93% 95%       Physical Exam  Physical  Exam    Constitutional:  69 y.o. female  normal color, no cyanosis. Resting in bed in no acute distress  HENT:   Head: Normocephalic and atraumatic.   Mouth/Throat: Oropharynx is clear and moist.   Eyes: EOMI, PERRL , conjunctivae without discharge bilaterally  Neck: Trachea  midline. Neck supple.  Cardiovascular: Tachycardic, No murmurs, rubs or gallops. Intact distal pulses.  Pulmonary/Chest: Breath sounds equal bilaterally. No respiratory distress. No wheezes, rales, or chest tenderness. Dyspnea on exertion.   Abdominal: Abdomen soft, nontender, no rebound or guarding.  Back: No midline spinal tenderness, no paraspinal tenderness, no CVA tenderness.           Musculoskeletal: No edema, tenderness or deformity.  Skin: Warm and dry. No rash, erythema, pallor or cyanosis  Psychiatric: Normal mood and affect. Behavior is normal.   Neurological: Patient alert and responsive, CN II-XII grossly intact, moving all extremities equally and fully,     Labs:   Labs Reviewed   VENOUS BLOOD GAS - Abnormal; Notable for the following components:       Result Value    O2 SATURATION (VENOUS) 71.4 (*)     All other components within normal limits   BASIC METABOLIC PANEL - Abnormal; Notable for the following components:    CALCIUM 8.6 (*)     GLUCOSE 122 (*)     All other components within normal limits    Narrative:     Estimated Glomerular Filtration Rate (eGFR) calculated using the CKD-EPI (2009) equation, intended for patients 68 years of age and older. If race and/or gender is not documented or "unknown," there will be no eGFR calculation.   CBC WITH DIFF - Abnormal; Notable for the following components:    MCV 73.3 (*)     MCH 23.5 (*)     MCHC 32.1 (*)     RDW 18.4 (*)     MONOCYTE # 1.10 (*)     All other components within normal limits   PT/INR - Abnormal; Notable for the following components:    INR 1.15 (*)     All other components within normal limits    Narrative:     Coumadin Therapy INR range for Conventional Anticoagulation Therapy is 2.0 to 3.0 and for Intensive Anticoagulation Therapy 2.5 to 3.5   PTT (PARTIAL THROMBOPLASTIN TIME) - Abnormal; Notable for the following components:    APTT 66.7 (*)     All other components within normal limits   B-TYPE NATRIURETIC PEPTIDE - Abnormal;  Notable for the following components:    BNP 107 (*)     All other components within normal limits   TROPONIN-I - Normal   PROCALCITONIN - Normal   CBC/DIFF    Narrative:     The following orders were created for panel order CBC/DIFF.  Procedure                               Abnormality         Status                     ---------                               -----------         ------  CBC WITH HBZJ[[id number]]                Abnormal            Final result                 Please view results for these tests on the individual orders.   COVID-19 [place] LAB TESTING       Imaging:  Results for orders placed or performed during the hospital encounter of 11/30/19   XR AP MOBILE CHEST     Status: None    Narrative    Kelsey [name]    PROCEDURE DESCRIPTION: XR AP MOBILE CHEST    CLINICAL INDICATION: SOB    TECHNIQUE: 1 views / 1 images submitted.    COMPARISON: 10/22/2019.      FINDINGS:   There is a stable right-sided subclavian MediPort catheter in place. There  is stable prominence of the right hilum which may represent underlying  malignancy in the appropriate clinical setting. The cardiac silhouette is  not enlarged. There is no pneumothorax. There is a stable right basilar  pleural effusion and/or atelectasis. There is a developing infiltrate in  the lingula segment of the left upper lobe of the lungs.      Impression    1. Stable right hilar mass which may represent underlying malignancy.  2. Stable right basilar pleural effusion and/or atelectasis versus  scarring.  3. Developing infiltrate involving the lingual segment of the left upper  lobe of the lung.                Radiologist location ID: [id number]     CT CHEST WO IV CONTRAST     Status: None    Narrative    Kelsey [name]    PROCEDURE DESCRIPTION: CT CHEST WO IV CONTRAST    CLINICAL INDICATION: hx of lung cancer, here for shortness of breath    The CT exam was performed using one or more of the following dose  reduction  techniques: automated exposure control, adjustment of the mA and/or kV  according to the patient's size, or use of iterative reconstruction  technique.    Chest x-ray has been performed earlier today. Previous CT chest was  performed on 10/29/19.    FINDINGS:  There is a stable appearing right perihilar opacity which may represent  postradiation change. Additional underlying malignancy is not excluded  although this has not progressed. There is new heterogeneous airspace  disease throughout the left lung mostly in the left lower lobe suspicious  for worsening pneumonia. There has been partial clearing of previous  bilateral upper lobe opacities. There is a stable small right pleural  effusion. Small mediastinal lymph nodes are stable. Calcified mediastinal  nodes are present. Right-sided port is again seen. There are mild  degenerative changes in the thoracic spine. Sternum and ribs demonstrate no  acute abnormality.      Impression    1. New heterogeneous airspace disease in the left lung mostly in the left  lower lobe. This is suspicious for pneumonia.  2. Improved appearance of previous bilateral upper lobe opacities.  3. Stable right hilar prominence which may be from postradiation change.      Radiologist location ID: [id number]           Orders:  Orders Placed This Encounter    XR AP MOBILE CHEST    CT CHEST WO IV CONTRAST    VENOUS BLOOD GAS    TROPONIN-I  CBC/DIFF    BASIC METABOLIC PANEL    CBC WITH DIFF    PT/INR    PTT (PARTIAL THROMBOPLASTIN TIME)    B-TYPE NATRIURETIC PEPTIDE    COVID-19 - SCREENING - Admission (NON-PUI)    PROCALCITONIN    ECG 12 LEAD - ED USE    ampicillin-sulbactam (UNASYN) 3 g in NS 100 mL IVPB minibag    azithromycin (ZITHROMAX) tablet       Course and MDM:  Patient seen and examined. Labs and imaging reviewed.  Kelsey [name] is a 69 y.o. female presenting to the emergency department with worsening shortness of breath in the setting of known lung cancer.   On arrival patient was placed on oxygen for symptomatic management.  This was titrated off throughout duration of patient's stay.  Patient remained with oxygen saturations above 93% while off nasal cannula.  EKG obtained, no acute ST elevations or depressions noted.  Chest x-ray concerning for developing infiltrate along the lingual segment of the left upper lobe.  Patient was started on antibiotics, Unasyn and azithromycin while in the emergency department.  Given patient's history of cancer, on active chemotherapy, some concern for pulmonary embolism in this patient.  Patient however does reportedly have severe contrast allergy and thus a non-con CT was obtained.  This did again show concern for developing pneumonia.  Given patient's severe dyspnea, imaging findings concerning for pneumonia, and her history of active chemotherapy, decision made to admit patient to hospital.  Hospitalist team contacted and agreeable to admit patient for further management.        The patient will be admitted to Hospitalist  service for further management.        Disposition: Admitted    Follow up:   No follow-up provider specified.    I am scribing for, and in the presence of, Marshia Ly, MD for services provided on 11/30/2019.  [address], SCRIBE   [place], SCRIBE  11/30/2019, 16:55      I personally performed the services described in this documentation, as scribed  in my presence, and it is both accurate  and complete.    Marshia Ly, MD          Wille Glaser Lise Auer, MD 11/30/2019, 16:54   PGY-2 Emergency Medicine  [facility]    *Parts of this patients chart were completed in a retrospective fashion due to simultaneous direct patient care activities in the Emergency Department.   *This note was partially generated using MModal Fluency Direct system, and there may be some incorrect words, spellings, and punctuation that were not noted in checking the note before saving.

## 2019-12-01 ENCOUNTER — Other Ambulatory Visit: Payer: Self-pay

## 2019-12-01 ENCOUNTER — Encounter (HOSPITAL_COMMUNITY): Payer: Self-pay | Admitting: Adolescent Medicine

## 2019-12-01 DIAGNOSIS — I480 Paroxysmal atrial fibrillation: Secondary | ICD-10-CM

## 2019-12-01 LAB — COVID-19, FLU A/B, RSV RAPID BY PCR
INFLUENZA VIRUS TYPE A: NOT DETECTED
INFLUENZA VIRUS TYPE B: NOT DETECTED
RESPIRATORY SYNCTIAL VIRUS (RSV): NOT DETECTED
SARS-CoV-2: NOT DETECTED

## 2019-12-01 LAB — CBC WITH DIFF
BASOPHIL #: 0 10*3/uL (ref 0.00–0.20)
BASOPHIL %: 0 %
EOSINOPHIL #: 0 10*3/uL (ref 0.00–0.50)
EOSINOPHIL %: 0 %
HCT: 35.7 % (ref 34.6–46.2)
HGB: 11.7 g/dL — ABNORMAL LOW (ref 11.8–15.8)
LYMPHOCYTE #: 0.7 10*3/uL — ABNORMAL LOW (ref 0.90–3.40)
LYMPHOCYTE %: 11 %
MCH: 24 pg — ABNORMAL LOW (ref 27.6–33.2)
MCHC: 32.9 g/dL (ref 32.6–35.4)
MCV: 73.1 fL — ABNORMAL LOW (ref 82.3–96.7)
MONOCYTE #: 0.1 10*3/uL — ABNORMAL LOW (ref 0.20–0.90)
MONOCYTE %: 2 %
MPV: 7.7 fL (ref 6.6–10.2)
NEUTROPHIL #: 5.5 10*3/uL (ref 1.50–6.40)
NEUTROPHIL %: 87 %
PLATELETS: 297 10*3/uL (ref 140–440)
RBC: 4.88 10*6/uL (ref 3.80–5.24)
RDW: 18.6 % — ABNORMAL HIGH (ref 12.4–15.2)
WBC: 6.3 10*3/uL (ref 3.5–10.3)

## 2019-12-01 LAB — BASIC METABOLIC PANEL
ANION GAP: 7 mmol/L
BUN/CREA RATIO: 16
BUN: 15 mg/dL (ref 10–25)
CALCIUM: 8.7 mg/dL — ABNORMAL LOW (ref 8.8–10.3)
CHLORIDE: 105 mmol/L (ref 98–111)
CO2 TOTAL: 25 mmol/L (ref 21–35)
CREATININE: 0.93 mg/dL (ref <=1.30)
ESTIMATED GFR: >60 mL/min/{1.73_m2}
GLUCOSE: 234 mg/dL — ABNORMAL HIGH (ref 70–110)
POTASSIUM: 4.4 mmol/L (ref 3.5–5.0)
SODIUM: 137 mmol/L (ref 135–145)

## 2019-12-01 LAB — PHOSPHORUS: PHOSPHORUS: 2.9 mg/dL (ref 2.5–4.5)

## 2019-12-01 LAB — MAGNESIUM: MAGNESIUM: 1.9 mg/dL (ref 1.8–2.3)

## 2019-12-01 LAB — C-REACTIVE PROTEIN(CRP),INFLAMMATION: CRP INFLAMMATION: 43 mg/L — ABNORMAL HIGH (ref <=5.0)

## 2019-12-01 LAB — POC BLOOD GLUCOSE (RESULTS): GLUCOSE, POC: 107 mg/dl (ref 70–110)

## 2019-12-01 LAB — PROCALCITONIN: PROCALCITONIN: <0.05 ng/mL (ref <0.50)

## 2019-12-01 LAB — PT/INR: INR: 1.44 — ABNORMAL HIGH (ref 0.80–1.10)

## 2019-12-01 LAB — B-TYPE NATRIURETIC PEPTIDE: BNP: 106 pg/mL — ABNORMAL HIGH (ref 0–100)

## 2019-12-01 MED ORDER — TIOTROPIUM BROMIDE 18 MCG CAPS WITH INHALATION DEVICE - EAST
18.00 ug | ORAL_CAPSULE | Freq: Every day | RESPIRATORY_TRACT | Status: DC
Start: 2019-12-01 — End: 2019-12-03
  Administered 2019-12-02: 0 ug via RESPIRATORY_TRACT
  Administered 2019-12-03: 18 ug via RESPIRATORY_TRACT
  Filled 2019-12-01 (×4): qty 1

## 2019-12-01 MED ORDER — BUDESONIDE-FORMOTEROL HFA 80 MCG-4.5 MCG/ACTUATION - EAST
2.00 | INHALATION_SPRAY | Freq: Two times a day (BID) | RESPIRATORY_TRACT | Status: DC
Start: 2019-12-01 — End: 2019-12-03
  Administered 2019-12-01: 2 via RESPIRATORY_TRACT
  Administered 2019-12-02 (×2): 0 via RESPIRATORY_TRACT
  Administered 2019-12-03: 2 via RESPIRATORY_TRACT
  Filled 2019-12-01 (×2): qty 6.9

## 2019-12-01 MED ORDER — ALBUTEROL SULFATE 2.5 MG/3 ML (0.083 %) SOLUTION FOR NEBULIZATION
2.50 mg | INHALATION_SOLUTION | RESPIRATORY_TRACT | Status: DC | PRN
Start: 2019-12-01 — End: 2019-12-03
  Administered 2019-12-02 (×2): 2.5 mg via RESPIRATORY_TRACT
  Filled 2019-12-01 (×3): qty 3

## 2019-12-01 NOTE — Consults (Signed)
[place]  Initial Note    Kelsey [name], Kelsey [name]  Date of Admission:  11/30/2019  Date of Service: 12/01/2019  Date of Birth:  [birth year]    Requesting MD: Felipa Evener, MD    Assessment/Plan:  69 y.o. 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  CT chest on admission concerning for pneumonia.  COVID-19 screening test negative.    1. Shortness of breath:  - Likely due to pneumonia.  - Agree with antibiotics.  -continue breathing treatment.  Continue supplemental oxygen.    2. Non-small cell lung cancer:  - Currently on maintenance treatment with durvalumab.  Will hold treatment for now.  - durvalumab(immunotherapy) can cause pneumonitis.  Given her x-ray finding and symptom does not fit the picture of pneumonitis.  Will continue current treatment for pneumonia.      DVT/PE ProphylaxisApixaban  Disposition Planning: Home discharge           HPI/Discussion: 69 y.o. female with non-small cell lung cancer currently on maintenance treatment with durvalumab admitted to hospital with shortness of breath and low-grade fever.  Patient reported increasing shortness of breath over.  Of months which got worse within last couple weeks.  Although there is no documented fever but patient reports of low-grade fever.  She completed her chemoradiation and now has been on maintenance treatment with durvalumab.  She denies any sick contacts.  Denies any trauma or injury.    REVIEW OF SYSTEMS:  GENERAL:  no fevers, chills.    HEENT: no sore throat, congestion, blurry vision  Lungs: + shortness of breath, cough, wheezes at this time  GI: no nausea, vomiting, diarrhea, constipation  GU: No dysuria, urgency, increased frequency   Cardiac: no chest pains, palpitations, syncope,   Neuro: no weakness, loss of function, numbness, tingling  Skin: no rash  Musculoskeletal: No  myalgias  Psychosocial: No depression, anxiety  Hematologic: No easy bruising, abnormal bleeding  All other review ROS negative except those mentioned above.    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     HTN (hypertension)     Hyperlipidemia     Lung mass     with pleural effusion    Obesity     Sleep apnea     Ventral hernia          Past Surgical History:   Procedure Laterality Date    ABSCESS DRAINAGE      BOWEL RESECTION      CATARACT EXTRACTION      HX CERVICAL CONE BIOPSY       HX CESAREAN SECTION      HX GASTRIC BYPASS      25 years ago    [place] (x2)    HX HYSTERECTOMY      late 74s    HX LAP BANDING      2013    HX LAPAROTOMY      2013 to remove lap band         Medications Prior  to Admission     Prescriptions    albuterol sulfate (PROVENTIL OR VENTOLIN OR PROAIR) 90 mcg/actuation Inhalation HFA Aerosol Inhaler    INHALE 1 TO 2 PUFFS BY MOUTH EVERY 6 HOURS AS NEEDED    albuterol sulfate (PROVENTIL) 2.5 mg /3 mL (0.083 %) Inhalation Solution for Nebulization    3 mL (2.5 mg total) by Nebulization route Every 6 hours    apixaban (ELIQUIS) 5 mg Oral Tablet    Take 1 Tab (5 mg total) by mouth Twice daily    atorvastatin (LIPITOR) 10 mg Oral Tablet    Take 10 mg by mouth Once a day    budesonide-formoteroL (SYMBICORT) 160-4.5 mcg/actuation Inhalation HFA Aerosol Inhaler    Take 2 Puffs by inhalation Twice daily    carvediloL (COREG) 3.125 mg Oral Tablet    Take 3.125 mg by mouth Twice daily with food    docusate sodium (COLACE) 100 mg Oral Capsule    Take 100 mg by mouth Three times a day as needed     flecainide (TAMBOCOR) 100 mg Oral Tablet    Take 1 Tab (100 mg total) by mouth Twice daily    fluticasone propionate (FLONASE) 50  mcg/actuation Nasal Spray, Suspension    1 Spray by Each Nostril route Once a day    Patient not taking:  Reported on 12/01/2019    levothyroxine (SYNTHROID) 112 mcg Oral Tablet    Take 1 Tablet (112 mcg total) by mouth Every morning    linaCLOtide (LINZESS) 72 mcg Oral Capsule    Take 72 mcg by mouth Every morning    lisinopriL (PRINIVIL) 20 mg Oral Tablet    Take 20 mg by mouth Once a day    loperamide (IMODIUM) 2 mg Oral Capsule    Take 2 mg by mouth Every 4 hours as needed    loratadine (CLARITIN) 10 mg Oral Tablet    TAKE 1 TABLET BY MOUTH EVERY DAY    magnesium oxide (MAG-OX) 400 mg Oral Tablet    Take 400 mg by mouth Once a day    omeprazole (PRILOSEC) 20 mg Oral Capsule, Delayed Release(E.C.)    Take 40 mg by mouth Once a day     oxyCODONE (ROXICODONE) 5 mg Oral Tablet    Take 1 Tablet (5 mg total) by mouth Every 6 hours as needed for Pain    potassium chloride (K-DUR) 20 mEq Oral Tab Sust.Rel. Particle/Crystal    Take 20 mEq by mouth Once a day Takes 3 days a week    prochlorperazine (COMPAZINE) 10 mg Oral Tablet    Take 1 Tab (10 mg total) by mouth Four times a day as needed for Nausea/Vomiting    rOPINIRole (REQUIP) 0.5 mg Oral Tablet    Take 0.5 mg by mouth Every night        acetaminophen (TYLENOL) tablet, 650 mg, Oral, Q6H PRN  albuterol (PROVENTIL) 2.5 mg / 3 mL (0.083%) neb solution, 2.5 mg, Nebulization, Q4H PRN  ampicillin-sulbactam (UNASYN) 3 g in NS 100 mL IVPB minibag, 3 g, Intravenous, Q6H  apixaban (ELIQUIS) tablet, 5 mg, Oral, 2x/day  atorvastatin (LIPITOR) tablet, 10 mg, Oral, Daily  azithromycin (ZITHROMAX) tablet, 500 mg, Oral, Daily  budesonide-formoterol (SYMBICORT) 80 mcg-4.5 mcg per inhalation oral inhaler, 2 Puff, Inhalation, 2x/day  carvedilol (COREG) tablet, 3.125 mg, Oral, 2x/day-Food  docusate sodium (COLACE) capsule, 100 mg, Oral, 3x/day PRN  flecainide (TAMBOCOR) tablet, 100 mg, Oral, 2x/day  guaiFENesin (MUCINEX) extended release tablet - for cough (  expectorant), 600 mg, Oral,  2x/day  levothyroxine (SYNTHROID) tablet, 112 mcg, Oral, QAM  linaclotide (LINZESS) capsule, 72 mcg, Oral, QAM  lisinopril (PRINIVIL) tablet, 20 mg, Oral, Daily  loperamide (IMODIUM) capsule, 2 mg, Oral, Q4H PRN  loratadine (CLARITIN) tablet, 10 mg, Oral, Daily  NS flush syringe, 3 mL, Intracatheter, Q8HRS  NS flush syringe, 3 mL, Intracatheter, Q1H PRN  ondansetron (ZOFRAN) 2 mg/mL injection, 4 mg, Intravenous, Q6H PRN  oxyCODONE (ROXICODONE) immediate release tablet, 5 mg, Oral, Q6H PRN  pantoprazole (PROTONIX) delayed release tablet, 40 mg, Oral, Daily before Breakfast  potassium chloride (K-DUR) extended release tablet, 20 mEq, Oral, Daily  rOPINIRole (REQUIP) tablet, 0.5 mg, Oral, NIGHTLY  sennosides-docusate sodium (SENOKOT-S) 8.6-50mg  per tablet, 1 Tablet, Oral, HS PRN  tiotropium bromide (SPIRIVA HANDIHALER) 18 mcg caps with inhalation device, 18 mcg, Inhalation, Daily      Allergies   Allergen Reactions    Shellfish Containing Products Shortness of Breath, Rash and Itching     scallops    Vancomycin Shortness of Breath    Barium Iodide     Iodine And Iodide Containing Products      Itching, shortness of breath      Family History  Family Medical History:     Problem Relation (Age of Onset)    Diabetes Mother, Maternal Grandmother, Maternal Grandfather, Father, Sister    Heart Attack Mother    Stroke Father          Social History  Social History     Socioeconomic History    Marital status: Married     Spouse name: Not on file    Number of children: Not on file    Years of education: Not on file    Highest education level: Not on file   Occupational History    Not on file   Tobacco Use    Smoking status: Former Smoker    Smokeless tobacco: Never Used    Tobacco comment: quit 10 yrs ago   Vaping Use    Vaping Use: Never used   Substance and Sexual Activity    Alcohol use: Not Currently     Comment: rarely    Drug use: No    Sexual activity: Not Currently   Other Topics Concern    Ability to  Walk 1 Flight of Steps without SOB/CP No    Routine Exercise Not Asked    Ability to Walk 2 Flight of Steps without SOB/CP Not Asked    Unable to Ambulate Not Asked    Total Care Not Asked    Ability To Do Own ADL's Yes    Uses Walker Not Asked    Other Activity Level Not Asked    Uses Cane Not Asked   Social History Narrative    Not on file     Social Determinants of Health     Financial Resource Strain:     Difficulty of Paying Living Expenses:    Food Insecurity:     Worried About Charity fundraiser in the Last Year:     Arboriculturist in the Last Year:    Transportation Needs:     Film/video editor (Medical):     Lack of Transportation (Non-Medical):    Physical Activity:     Days of Exercise per Week:     Minutes of Exercise per Session:    Stress:     Feeling of Stress :    Intimate Partner Violence:  Fear of Current or Ex-Partner:     Emotionally Abused:     Physically Abused:     Sexually Abused:        Objective     EXAM  VITALS:    Temperature: 37.3 C (99.1 F)  Heart Rate: (!) 103  BP (Non-Invasive): (!) 144/81  Respiratory Rate: (!) 21  SpO2: 98 %  Pain Score (Numeric, Faces): 5  Constitutional:  appears chronically ill  Neck:  no thyromegaly or lymphadenopathy  Respiratory:  Clear to auscultation bilaterally.   Cardiovascular:  regular rate and rhythm  Gastrointestinal:  Soft, non-tender  Neurologic:  Grossly normal  Lymphatic/Immunologic/Hematologic:  No lymphadenopathy    IMAGES:   CT CHEST WO IV CONTRAST   Final Result   1. New heterogeneous airspace disease in the left lung mostly in the left   lower lobe. This is suspicious for pneumonia.   2. Improved appearance of previous bilateral upper lobe opacities.   3. Stable right hilar prominence which may be from postradiation change.         Radiologist location ID: [id number]         XR AP MOBILE CHEST   Final Result   1. Stable right hilar mass which may represent underlying malignancy.   2. Stable right basilar pleural  effusion and/or atelectasis versus   scarring.   3. Developing infiltrate involving the lingual segment of the left upper   lobe of the lung.                        Radiologist location ID: [id number]               LABS:    Lab Results Today:    Results for orders placed or performed during the hospital encounter of 11/30/19 (from the past 24 hour(s))   VENOUS BLOOD GAS   Result Value Ref Range    %FIO2 (VENOUS) 28.0 %    BICARBONATE (VENOUS) 24.0 23.0 - 33.0 mmol/L    PCO2 (VENOUS) 39.00 38.00 - 50.00 mm/Hg    PH (VENOUS) 7.40 7.32 - 7.45    PO2 (VENOUS) 41.0 30.0 - 50.0 mm/Hg    BASE DEFICIT 0.5 -2.0 - 3.0 mmol/L    O2 SATURATION (VENOUS) 71.4 (L) 95.0 - 98.0 %   TROPONIN-I   Result Value Ref Range    TROPONIN I <0.03 <=0.04 ng/mL   BASIC METABOLIC PANEL   Result Value Ref Range    SODIUM 139 135 - 145 mmol/L    POTASSIUM 4.2 3.5 - 5.0 mmol/L    CHLORIDE 106 98 - 111 mmol/L    CO2 TOTAL 26 21 - 35 mmol/L    ANION GAP 7 mmol/L    CALCIUM 8.6 (L) 8.8 - 10.3 mg/dL    GLUCOSE 122 (H) 70 - 110 mg/dL    BUN 12 10 - 25 mg/dL    CREATININE 0.74 <=1.30 mg/dL    BUN/CREA RATIO 16     ESTIMATED GFR >60 Avg: 85 mL/min/1.44m2   CBC WITH DIFF   Result Value Ref Range    WBC 7.1 3.5 - 10.3 x103/uL    RBC 5.15 3.80 - 5.24 x106/uL    HGB 12.1 11.8 - 15.8 g/dL    HCT 37.7 34.6 - 46.2 %    MCV 73.3 (L) 82.3 - 96.7 fL    MCH 23.5 (L) 27.6 - 33.2 pg    MCHC 32.1 (L) 32.6 - 35.4 g/dL  RDW 18.4 (H) 12.4 - 15.2 %    PLATELETS 318 140 - 440 x103/uL    MPV 7.1 6.6 - 10.2 fL    NEUTROPHIL % 70 %    LYMPHOCYTE % 12 %    MONOCYTE % 15 %    EOSINOPHIL % 2 %    BASOPHIL % 1 %    NEUTROPHIL # 5.00 1.50 - 6.40 x103/uL    LYMPHOCYTE # 0.90 0.90 - 3.40 x103/uL    MONOCYTE # 1.10 (H) 0.20 - 0.90 x103/uL    EOSINOPHIL # 0.20 0.00 - 0.50 x103/uL    BASOPHIL # 0.00 0.00 - 0.20 x103/uL   PT/INR   Result Value Ref Range    INR 1.15 (H) 0.80 - 1.10   PTT (PARTIAL THROMBOPLASTIN TIME)   Result Value Ref Range    APTT 66.7 (H) 25.0 - 37.0 seconds     B-TYPE NATRIURETIC PEPTIDE   Result Value Ref Range    BNP 107 (H) 0 - 100 pg/mL   PROCALCITONIN   Result Value Ref Range    PROCALCITONIN  <0.05 <0.50 ng/mL   COVID-19 SCREENING INCLUDING FLU A/B, RSV RAPID BY PCR   Result Value Ref Range    SARS-CoV-2 Not Detected Not Detected    INFLUENZA VIRUS TYPE A Not Detected Not Detected    INFLUENZA VIRUS TYPE B Not Detected Not Detected    RESPIRATORY SYNCTIAL VIRUS (RSV) Not Detected Not Detected   BASIC METABOLIC PANEL, NON-FASTING   Result Value Ref Range    SODIUM 137 135 - 145 mmol/L    POTASSIUM 4.4 3.5 - 5.0 mmol/L    CHLORIDE 105 98 - 111 mmol/L    CO2 TOTAL 25 21 - 35 mmol/L    ANION GAP 7 mmol/L    CALCIUM 8.7 (L) 8.8 - 10.3 mg/dL    GLUCOSE 234 (H) 70 - 110 mg/dL    BUN 15 10 - 25 mg/dL    CREATININE 0.93 <=1.30 mg/dL    BUN/CREA RATIO 16     ESTIMATED GFR >60 Avg: 85 mL/min/1.38m2   MAGNESIUM   Result Value Ref Range    MAGNESIUM 1.9 1.8 - 2.3 mg/dL   PHOSPHORUS   Result Value Ref Range    PHOSPHORUS 2.9 2.5 - 4.5 mg/dL   PT/INR   Result Value Ref Range    INR 1.44 (H) 0.80 - 1.10   CBC WITH DIFF   Result Value Ref Range    WBC 6.3 3.5 - 10.3 x103/uL    RBC 4.88 3.80 - 5.24 x106/uL    HGB 11.7 (L) 11.8 - 15.8 g/dL    HCT 35.7 34.6 - 46.2 %    MCV 73.1 (L) 82.3 - 96.7 fL    MCH 24.0 (L) 27.6 - 33.2 pg    MCHC 32.9 32.6 - 35.4 g/dL    RDW 18.6 (H) 12.4 - 15.2 %    PLATELETS 297 140 - 440 x103/uL    MPV 7.7 6.6 - 10.2 fL    NEUTROPHIL % 87 %    LYMPHOCYTE % 11 %    MONOCYTE % 2 %    EOSINOPHIL % 0 %    BASOPHIL % 0 %    NEUTROPHIL # 5.50 1.50 - 6.40 x103/uL    LYMPHOCYTE # 0.70 (L) 0.90 - 3.40 x103/uL    MONOCYTE # 0.10 (L) 0.20 - 0.90 x103/uL    EOSINOPHIL # 0.00 0.00 - 0.50 x103/uL    BASOPHIL # 0.00 0.00 -  0.20 x103/uL   C-REACTIVE PROTEIN(CRP),INFLAMMATION   Result Value Ref Range    CRP INFLAMMATION 43.0 (H) <=5.0 mg/L         Vanetta Mulders, MD

## 2019-12-01 NOTE — ED Nurses Note (Signed)
Report called to Caryl Pina, RN on 6N. Opportunity for questions/ concerns voiced at this time with none being mentioned. Ardelle Park, RN  12/01/2019, 03:18

## 2019-12-01 NOTE — Care Plan (Signed)
Problem: Adult Inpatient Plan of Care  Goal: Plan of Care Review  12/01/2019 0429 by Kizzie Furnish, RN  Outcome: Ongoing (see interventions/notes)  Flowsheets (Taken 12/01/2019 0427)  Plan of Care Reviewed With: patient  12/01/2019 0427 by Kizzie Furnish, RN  Outcome: Ongoing (see interventions/notes)  Flowsheets (Taken 12/01/2019 0427)  Plan of Care Reviewed With: patient  Goal: Patient-Specific Goal (Individualized)  Outcome: Ongoing (see interventions/notes)  Goal: Absence of Hospital-Acquired Illness or Injury  Outcome: Ongoing (see interventions/notes)  Intervention: Identify and Manage Fall Risk  Flowsheets (Taken 12/01/2019 0429)  Safety Promotion/Fall Prevention:   activity supervised   fall prevention program maintained   safety round/check completed   nonskid shoes/slippers when out of bed  Intervention: Prevent Skin Injury  Flowsheets (Taken 12/01/2019 0429)  Body Position:   sitting   positioned/repositioned independently  Intervention: Prevent and Manage VTE (Venous Thromboembolism) Risk  Flowsheets (Taken 12/01/2019 0429)  VTE Prevention/Management: ambulation promoted  Intervention: Prevent Infection  Flowsheets (Taken 12/01/2019 0429)  Infection Prevention:   single patient room provided   rest/sleep promoted  Goal: Optimal Comfort and Wellbeing  Outcome: Ongoing (see interventions/notes)  Intervention: Provide Person-Centered Care  Flowsheets (Taken 12/01/2019 0429)  Trust Relationship/Rapport:   care explained   choices provided   questions answered   questions encouraged   thoughts/feelings acknowledged     Problem: Breathing Pattern Ineffective  Goal: Effective Breathing Pattern  Outcome: Ongoing (see interventions/notes)  Intervention: Promote Improved Breathing Pattern  Flowsheets (Taken 12/01/2019 0429)  Supportive Measures:   active listening utilized   self-care encouraged   verbalization of feelings encouraged     Problem: Fall Injury Risk  Goal: Absence of Fall and Fall-Related  Injury  Outcome: Ongoing (see interventions/notes)  Intervention: Identify and Manage Contributors  Flowsheets (Taken 12/01/2019 0429)  Self-Care Promotion:   independence encouraged   BADL personal objects within reach  Medication Review/Management: medications reviewed  Intervention: [place] (Taken 12/01/2019 0429)  Safety Promotion/Fall Prevention:   activity supervised   fall prevention program maintained   safety round/check completed   nonskid shoes/slippers when out of bed

## 2019-12-01 NOTE — Nurses Notes (Signed)
Report called to [facility] on 4N. Patient on phone with husband and told him about transfer/new room. All questions answered appropriately. Patient to be transferred with belongings to [id number]. Tillie Fantasia, RN  12/01/2019, 22:23'

## 2019-12-01 NOTE — Nurses Notes (Signed)
Patient is alert and oriented. Patient ambulates with stand-by assist and remained free from falls throughout shift. Patient is free from skin injuries. No reports of chest pain or shortness of breath. Report given to RN.   Kizzie Furnish, RN  12/01/2019, 09:04

## 2019-12-01 NOTE — Progress Notes (Signed)
[facility]  HOSPITALIST PROGRESS NOTE      Kelsey, Kelsey [name], 69 y.o. female  Date of Admission:  11/30/2019  Date of service: 12/01/2019  Date of Birth:  June 16, 1951    Hospital Day:  LOS: 1 day     Assessment/Plan:    Pneumonia, community acquired  Lung Cancer  COPD, probable acute exacerbation  Paroxysmal Atrial Fibrillation  Hypertension  Hyperlipidemia  Hypothyroidism  BMI of 45  - Continue Unasyn and azithromycin (start date 11/30/19)  - Pulmonary consult per patient request.  - Continue Mucinex, bronchodilators, solu-medrol BID (wean as tolerated)  - Oxycodone for pain control.  - Oncology consultation for continuity of care.  - Monitor on telemetry. Continue flecainide and apixaban.  - Continue Lipitor for hyperlipidemia  - Continue Synthroid for hypothyroidism.    DVT/PE Prophylaxis: Apixaban  GI: Proton Pump inhibitor   Disposition Planning: Home discharge     Subjective   Subjective: Chart reviewed. No acute events since admission. Patient with no current cardiopulmonary complaints. Receiving IV abx.    ROS: Other than ROS in the HPI, all other systems were negative.    Current Medications:  acetaminophen (TYLENOL) tablet, 650 mg, Oral, Q6H PRN  ampicillin-sulbactam (UNASYN) 3 g in NS 100 mL IVPB minibag, 3 g, Intravenous, Q6H  apixaban (ELIQUIS) tablet, 5 mg, Oral, 2x/day  atorvastatin (LIPITOR) tablet, 10 mg, Oral, Daily  azithromycin (ZITHROMAX) tablet, 500 mg, Oral, Daily  budesonide (PULMICORT RESPULES) 0.25 mg/2 mL nebulizer suspension, 0.25 mg, Nebulization, 2x/day  carvedilol (COREG) tablet, 3.125 mg, Oral, 2x/day-Food  docusate sodium (COLACE) capsule, 100 mg, Oral, 3x/day PRN  flecainide (TAMBOCOR) tablet, 100 mg, Oral, 2x/day  guaiFENesin (MUCINEX) extended release tablet - for cough (expectorant), 600 mg, Oral, 2x/day  ipratropium-albuterol 0.5 mg-3 mg(2.5 mg base)/3 mL Solution for Nebulization, 3 mL, Nebulization, 4x/day  levothyroxine (SYNTHROID) tablet, 112 mcg, Oral, QAM  linaclotide  (LINZESS) capsule, 72 mcg, Oral, QAM  lisinopril (PRINIVIL) tablet, 20 mg, Oral, Daily  loperamide (IMODIUM) capsule, 2 mg, Oral, Q4H PRN  loratadine (CLARITIN) tablet, 10 mg, Oral, Daily  methylPREDNISolone sod succ (SOLU-MEDROL) 40 mg/mL injection, 40 mg, Intravenous, Q12H  NS flush syringe, 3 mL, Intracatheter, Q8HRS  NS flush syringe, 3 mL, Intracatheter, Q1H PRN  ondansetron (ZOFRAN) 2 mg/mL injection, 4 mg, Intravenous, Q6H PRN  oxyCODONE (ROXICODONE) immediate release tablet, 5 mg, Oral, Q6H PRN  pantoprazole (PROTONIX) delayed release tablet, 40 mg, Oral, Daily before Breakfast  potassium chloride (K-DUR) extended release tablet, 20 mEq, Oral, Daily  rOPINIRole (REQUIP) tablet, 0.5 mg, Oral, NIGHTLY  sennosides-docusate sodium (SENOKOT-S) 8.6-50mg  per tablet, 1 Tablet, Oral, HS PRN        Allergies   Allergen Reactions    Shellfish Containing Products Shortness of Breath, Rash and Itching     scallops    Vancomycin Shortness of Breath    Barium Iodide     Iodine And Iodide Containing Products      Itching, shortness of breath        Objective   Objective:    Vital Signs:  Temperature: 37.3 C (99.1 F)  Heart Rate: (!) 103  BP (Non-Invasive): (!) 144/81  Respiratory Rate: (!) 21  SpO2: 98 %  Pain Score (Numeric, Faces): 5  Liter Flow (L/Min): 2    Intake & Output:    Intake/Output Summary (Last 24 hours) at 12/01/2019 1139  Last data filed at 12/01/2019 0900  Gross per 24 hour   Intake 240 ml   Output --  Net 240 ml     I/O current shift:  04/13 0700 - 04/13 1859  In: 240 [P.O.:240]  Out: -   Emesis:    BM:       Heme:      Today's Physical Exam:    Alert and oriented x3.  No acute distress.  Pleasant.  Mood affect and speech were all normal.  Pupils are equal reactive to light.  Extraocular movements intact.  Regular rate, no murmurs.  Lungs clear but decreased breath sounds at bases.  Abdomen is soft, nontender, nondistended.  Extremities are warm.  Good pulses.  No edema.  Skin has no rashes.  No  jaundice.  Cranial nerves 2-12 grossly intact.    Labs:  Lab Results Today:    Results for orders placed or performed during the hospital encounter of 11/30/19 (from the past 24 hour(s))   ECG 12 LEAD - ED USE   Result Value Ref Range    Heart Rate 100 BPM    PR Interval 143 ms    QRS Duration 90 ms    QT Interval 354 ms    QTC Calculation 457 ms    Calculated P Axis 20 deg    QRS Axis 41 deg    Calculated T Axis 53 deg    I 40 Axis 41 deg    T 40 Axis 82 deg    ST Axis 124 deg    EKG Severity - BORDERLINE ECG -    VENOUS BLOOD GAS   Result Value Ref Range    %FIO2 (VENOUS) 28.0 %    BICARBONATE (VENOUS) 24.0 23.0 - 33.0 mmol/L    PCO2 (VENOUS) 39.00 38.00 - 50.00 mm/Hg    PH (VENOUS) 7.40 7.32 - 7.45    PO2 (VENOUS) 41.0 30.0 - 50.0 mm/Hg    BASE DEFICIT 0.5 -2.0 - 3.0 mmol/L    O2 SATURATION (VENOUS) 71.4 (L) 95.0 - 98.0 %   TROPONIN-I   Result Value Ref Range    TROPONIN I <0.03 <=0.04 ng/mL   BASIC METABOLIC PANEL   Result Value Ref Range    SODIUM 139 135 - 145 mmol/L    POTASSIUM 4.2 3.5 - 5.0 mmol/L    CHLORIDE 106 98 - 111 mmol/L    CO2 TOTAL 26 21 - 35 mmol/L    ANION GAP 7 mmol/L    CALCIUM 8.6 (L) 8.8 - 10.3 mg/dL    GLUCOSE 122 (H) 70 - 110 mg/dL    BUN 12 10 - 25 mg/dL    CREATININE 0.74 <=1.30 mg/dL    BUN/CREA RATIO 16     ESTIMATED GFR >60 Avg: 85 mL/min/1.20m2   CBC WITH DIFF   Result Value Ref Range    WBC 7.1 3.5 - 10.3 x103/uL    RBC 5.15 3.80 - 5.24 x106/uL    HGB 12.1 11.8 - 15.8 g/dL    HCT 37.7 34.6 - 46.2 %    MCV 73.3 (L) 82.3 - 96.7 fL    MCH 23.5 (L) 27.6 - 33.2 pg    MCHC 32.1 (L) 32.6 - 35.4 g/dL    RDW 18.4 (H) 12.4 - 15.2 %    PLATELETS 318 140 - 440 x103/uL    MPV 7.1 6.6 - 10.2 fL    NEUTROPHIL % 70 %    LYMPHOCYTE % 12 %    MONOCYTE % 15 %    EOSINOPHIL % 2 %    BASOPHIL % 1 %  NEUTROPHIL # 5.00 1.50 - 6.40 x103/uL    LYMPHOCYTE # 0.90 0.90 - 3.40 x103/uL    MONOCYTE # 1.10 (H) 0.20 - 0.90 x103/uL    EOSINOPHIL # 0.20 0.00 - 0.50 x103/uL    BASOPHIL # 0.00 0.00 - 0.20  x103/uL   PT/INR   Result Value Ref Range    INR 1.15 (H) 0.80 - 1.10   PTT (PARTIAL THROMBOPLASTIN TIME)   Result Value Ref Range    APTT 66.7 (H) 25.0 - 37.0 seconds   B-TYPE NATRIURETIC PEPTIDE   Result Value Ref Range    BNP 107 (H) 0 - 100 pg/mL   PROCALCITONIN   Result Value Ref Range    PROCALCITONIN  <0.05 <0.50 ng/mL   COVID-19 SCREENING INCLUDING FLU A/B, RSV RAPID BY PCR   Result Value Ref Range    SARS-CoV-2 Not Detected Not Detected    INFLUENZA VIRUS TYPE A Not Detected Not Detected    INFLUENZA VIRUS TYPE B Not Detected Not Detected    RESPIRATORY SYNCTIAL VIRUS (RSV) Not Detected Not Detected   BASIC METABOLIC PANEL, NON-FASTING   Result Value Ref Range    SODIUM 137 135 - 145 mmol/L    POTASSIUM 4.4 3.5 - 5.0 mmol/L    CHLORIDE 105 98 - 111 mmol/L    CO2 TOTAL 25 21 - 35 mmol/L    ANION GAP 7 mmol/L    CALCIUM 8.7 (L) 8.8 - 10.3 mg/dL    GLUCOSE 234 (H) 70 - 110 mg/dL    BUN 15 10 - 25 mg/dL    CREATININE 0.93 <=1.30 mg/dL    BUN/CREA RATIO 16     ESTIMATED GFR >60 Avg: 85 mL/min/1.44m2   MAGNESIUM   Result Value Ref Range    MAGNESIUM 1.9 1.8 - 2.3 mg/dL   PHOSPHORUS   Result Value Ref Range    PHOSPHORUS 2.9 2.5 - 4.5 mg/dL   PT/INR   Result Value Ref Range    INR 1.44 (H) 0.80 - 1.10   CBC WITH DIFF   Result Value Ref Range    WBC 6.3 3.5 - 10.3 x103/uL    RBC 4.88 3.80 - 5.24 x106/uL    HGB 11.7 (L) 11.8 - 15.8 g/dL    HCT 35.7 34.6 - 46.2 %    MCV 73.1 (L) 82.3 - 96.7 fL    MCH 24.0 (L) 27.6 - 33.2 pg    MCHC 32.9 32.6 - 35.4 g/dL    RDW 18.6 (H) 12.4 - 15.2 %    PLATELETS 297 140 - 440 x103/uL    MPV 7.7 6.6 - 10.2 fL    NEUTROPHIL % 87 %    LYMPHOCYTE % 11 %    MONOCYTE % 2 %    EOSINOPHIL % 0 %    BASOPHIL % 0 %    NEUTROPHIL # 5.50 1.50 - 6.40 x103/uL    LYMPHOCYTE # 0.70 (L) 0.90 - 3.40 x103/uL    MONOCYTE # 0.10 (L) 0.20 - 0.90 x103/uL    EOSINOPHIL # 0.00 0.00 - 0.50 x103/uL    BASOPHIL # 0.00 0.00 - 0.20 x103/uL       Current Diet Order:  MNT PROTOCOL FOR DIETITIAN  DIET  CARDIAC (2G NA, LOW FAT, LOW CHOL, LOW CAFFEINE)    Felipa Evener, MD

## 2019-12-01 NOTE — Consults (Addendum)
Pulmonary Initial Consultation       Name: Kelsey [name]     MRN:  [id number]  CSN: [id number]  Date of Admission:  11/30/2019  Date of Service: 12/01/2019  Date of Birth:  Feb 26, 1951        Reason for Consult:  Pneumonia history of lung cancer  Requesting Physician: Felipa Evener, MD      CHIEF COMPLAINT:  Dyspnea    HISTORY OF PRESENT ILLNESS:      The patient is a 69 y.o. old female with history of lung cancer asthma and ex-smoker who was admitted the hospital for a right lower lobe presents to the hospital for bilateral pneumonia.  The patient presents emergency department yesterday complaining of worsening dyspnea.  Her dyspnea has been progressing over the last few months.  The patient was complaining of intermittent cough without current sputum production.  She is following with Dr. alley the oncologist for her lung cancer.  The patient has a history of asthma and used to smoke for more than 40 years.  She quit smoking 12 years ago.  The patient is currently on Symbicort and albuterol.  Emergency department a CT chest was done which showed bilateral pneumonia with right middle lobe consolidation and the left lower lobe infiltrates.  Of note the CT-guided biopsy was done in 2019 for the right lower lobe and showed well-differentiated adenocarcinoma.  The patient is currently on oxygen 2 liters/minute.  Her SpO2 level is 98%.  She reports history of snoring and daytime sleepiness.  The patient was diagnosed previously with OSA.    REVIEW OF SYSTEMS:      Other than ROS in the HPI, all other systems were negative.      Past Medical History:   Past Medical History:   Diagnosis Date    A-fib (CMS HCC)     Arthropathy, unspecified, site unspecified     Asthma     Cancer (CMS [place])     cervical    Cataract     Colon cancer (CMS [place]) 07/06/2014    COPD (chronic obstructive pulmonary disease) (CMS HCC)     Fistula     HTN (hypertension)     Hyperlipidemia     Lung mass     with pleural effusion    Obesity      Sleep apnea     Ventral hernia          Surgical history:  Past Surgical History:   Procedure Laterality Date    ABSCESS DRAINAGE      BOWEL RESECTION      CATARACT EXTRACTION      HX CERVICAL CONE BIOPSY       HX CESAREAN SECTION      HX GASTRIC BYPASS      25 years ago    [place] (x2)    HX HYSTERECTOMY      late 74s    HX LAP BANDING      2013    HX LAPAROTOMY      2013 to remove lap band         Social history:   Social History     Socioeconomic History    Marital status: Married     Spouse name: Not on file    Number of children: Not on file    Years of education: Not on file    Highest education level: Not on file   Occupational History  Not on file   Tobacco Use    Smoking status: Former Smoker    Smokeless tobacco: Never Used    Tobacco comment: quit 10 yrs ago   Vaping Use    Vaping Use: Never used   Substance and Sexual Activity    Alcohol use: Not Currently     Comment: rarely    Drug use: No    Sexual activity: Not Currently   Other Topics Concern    Ability to Walk 1 Flight of Steps without SOB/CP No    Routine Exercise Not Asked    Ability to Walk 2 Flight of Steps without SOB/CP Not Asked    Unable to Ambulate Not Asked    Total Care Not Asked    Ability To Do Own ADL's Yes    Uses Walker Not Asked    Other Activity Level Not Asked    Uses Cane Not Asked   Social History Narrative    Not on file     Social Determinants of Health     Financial Resource Strain:     Difficulty of Paying Living Expenses:    Food Insecurity:     Worried About Charity fundraiser in the Last Year:     Arboriculturist in the Last Year:    Transportation Needs:     Film/video editor (Medical):     Lack of Transportation (Non-Medical):    Physical Activity:     Days of Exercise per Week:     Minutes of Exercise per Session:    Stress:     Feeling of Stress :    Intimate Partner Violence:     Fear of Current or Ex-Partner:     Emotionally Abused:      Physically Abused:     Sexually Abused:      Family history:   Family Medical History:     Problem Relation (Age of Onset)    Diabetes Mother, Maternal Grandmother, Maternal Grandfather, Father, Sister    Heart Attack Mother    Stroke Father            Allergies:  Shellfish containing products, Vancomycin, Barium iodide, and Iodine and iodide containing products    Medications:   Medications Prior to Admission     Prescriptions    albuterol sulfate (PROVENTIL OR VENTOLIN OR PROAIR) 90 mcg/actuation Inhalation HFA Aerosol Inhaler    INHALE 1 TO 2 PUFFS BY MOUTH EVERY 6 HOURS AS NEEDED    albuterol sulfate (PROVENTIL) 2.5 mg /3 mL (0.083 %) Inhalation Solution for Nebulization    3 mL (2.5 mg total) by Nebulization route Every 6 hours    apixaban (ELIQUIS) 5 mg Oral Tablet    Take 1 Tab (5 mg total) by mouth Twice daily    atorvastatin (LIPITOR) 10 mg Oral Tablet    Take 10 mg by mouth Once a day    budesonide-formoteroL (SYMBICORT) 160-4.5 mcg/actuation Inhalation HFA Aerosol Inhaler    Take 2 Puffs by inhalation Twice daily    carvediloL (COREG) 3.125 mg Oral Tablet    Take 3.125 mg by mouth Twice daily with food    docusate sodium (COLACE) 100 mg Oral Capsule    Take 100 mg by mouth Three times a day as needed     flecainide (TAMBOCOR) 100 mg Oral Tablet    Take 1 Tab (100 mg total) by mouth Twice daily    fluticasone propionate (FLONASE) 50 mcg/actuation Nasal Spray, Suspension  1 Spray by Each Nostril route Once a day    Patient not taking:  Reported on 12/01/2019    levothyroxine (SYNTHROID) 112 mcg Oral Tablet    Take 1 Tablet (112 mcg total) by mouth Every morning    linaCLOtide (LINZESS) 72 mcg Oral Capsule    Take 72 mcg by mouth Every morning    lisinopriL (PRINIVIL) 20 mg Oral Tablet    Take 20 mg by mouth Once a day    loperamide (IMODIUM) 2 mg Oral Capsule    Take 2 mg by mouth Every 4 hours as needed    loratadine (CLARITIN) 10 mg Oral Tablet    TAKE 1 TABLET BY MOUTH EVERY DAY    magnesium oxide  (MAG-OX) 400 mg Oral Tablet    Take 400 mg by mouth Once a day    omeprazole (PRILOSEC) 20 mg Oral Capsule, Delayed Release(E.C.)    Take 40 mg by mouth Once a day     oxyCODONE (ROXICODONE) 5 mg Oral Tablet    Take 1 Tablet (5 mg total) by mouth Every 6 hours as needed for Pain    potassium chloride (K-DUR) 20 mEq Oral Tab Sust.Rel. Particle/Crystal    Take 20 mEq by mouth Once a day Takes 3 days a week    prochlorperazine (COMPAZINE) 10 mg Oral Tablet    Take 1 Tab (10 mg total) by mouth Four times a day as needed for Nausea/Vomiting    rOPINIRole (REQUIP) 0.5 mg Oral Tablet    Take 0.5 mg by mouth Every night            PHYSICAL EXAM:     Vital Signs:  Temp (24hrs) Max:37.3 C (98.9 F)      Systolic (21JHE), RDE:081 , Min:118 , KGY:185     Diastolic (63JSH), FWY:63, Min:67, Max:102    Temp  Avg: 36.7 C (98 F)  Min: 36 C (96.8 F)  Max: 37.3 C (99.1 F)  MAP (Non-Invasive)  Avg: 102.7 mmHG  Min: 88 mmHG  Max: 118 mmHG  Pulse  Avg: 101.9  Min: 90  Max: 120  Resp  Avg: 22.9  Min: 15  Max: 31  SpO2  Avg: 94 %  Min: 90 %  Max: 99 %  Pain Score (Numeric, Faces): 5    I/O:    Intake/Output Summary (Last 24 hours) at 12/01/2019 1035  Last data filed at 12/01/2019 0900  Gross per 24 hour   Intake 240 ml   Output --   Net 240 ml       GS: Patient is in no acute distress, oriented   HEENT: Non icteric sclera, no visible oral thrush.   Respiratory: Symmetric chest expansion.  Mild rhonchi No wheezes no rales. No current distress   Cardiovascular: Normal S1 and S2, no audible pathologic murmurs.   EXT:  edema in LE.  Gastrointestinal: Abdominal contour is obese. Not distended  Musculoskeletal: Station examination reveals midposition   Neurological: No focal visible motor deficit.  Hematology: No visible ecchymosis or epistaxis   GU: No CVA tenderness  Psychiatric: Speech with regular rate and rhythm. Stream of thought is spontaneous.   SKIN The skin has no visible rash, or ulcers noted    Current Medications:    Current  Facility-Administered Medications:     acetaminophen (TYLENOL) tablet, 650 mg, Oral, Q6H PRN, Thamas Jaegers, Hope, NP    ampicillin-sulbactam (UNASYN) 3 g in NS 100 mL IVPB minibag, 3 g, Intravenous, Q6H, Thamas Jaegers, Hope, NP,  Stopped at 12/01/19 0831    apixaban (ELIQUIS) tablet, 5 mg, Oral, 2x/day, Robb, Hope, NP, 5 mg at 12/01/19 0931    atorvastatin (LIPITOR) tablet, 10 mg, Oral, Daily, [place], [place], NP, 10 mg at 12/01/19 0930    azithromycin (ZITHROMAX) tablet, 500 mg, Oral, Daily, [place], [place], NP, Stopped at 12/01/19 0929    budesonide (PULMICORT RESPULES) 0.25 mg/2 mL nebulizer suspension, 0.25 mg, Nebulization, 2x/day, Thamas Jaegers, Hope, NP, 0.25 mg at 12/01/19 4132    carvedilol (COREG) tablet, 3.125 mg, Oral, 2x/day-Food, Brent Bulla, NP, 3.125 mg at 12/01/19 0931    docusate sodium (COLACE) capsule, 100 mg, Oral, 3x/day PRN, Brent Bulla, NP    flecainide (TAMBOCOR) tablet, 100 mg, Oral, 2x/day, Robb, Hope, NP, 100 mg at 12/01/19 0930    guaiFENesin ([place]) extended release tablet - for cough (expectorant), 600 mg, Oral, 2x/day, Robb, Hope, NP, 600 mg at 12/01/19 0930    ipratropium-albuterol 0.5 mg-3 mg(2.5 mg base)/3 mL Solution for Nebulization, 3 mL, Nebulization, 4x/day, Robb, [place], NP, 3 mL at 12/01/19 4401    levothyroxine (SYNTHROID) tablet, 112 mcg, Oral, QAM, Robb, Hope, NP, 112 mcg at 12/01/19 0800    linaclotide (LINZESS) capsule, 72 mcg, Oral, QAM, Robb, Hope, NP, 72 mcg at 12/01/19 0800    lisinopril (PRINIVIL) tablet, 20 mg, Oral, Daily, [place], [place], NP, 20 mg at 12/01/19 0930    loperamide (IMODIUM) capsule, 2 mg, Oral, Q4H PRN, Thamas Jaegers, Hope, NP    loratadine (CLARITIN) tablet, 10 mg, Oral, Daily, [place], Hope, NP    methylPREDNISolone sod succ (SOLU-MEDROL) 40 mg/mL injection, 40 mg, Intravenous, Q12H, Robb, Hope, NP, 40 mg at 12/01/19 0028    NS flush syringe, 3 mL, Intracatheter, Q8HRS, Robb, Hope, NP, 3 mL at 12/01/19 0800    NS flush syringe, 3 mL, Intracatheter, Q1H PRN, Thamas Jaegers, Hope, NP     ondansetron (ZOFRAN) 2 mg/mL injection, 4 mg, Intravenous, Q6H PRN, Thamas Jaegers, Hope, NP    oxyCODONE (ROXICODONE) immediate release tablet, 5 mg, Oral, Q6H PRN, Thamas Jaegers, Hope, NP    pantoprazole (PROTONIX) delayed release tablet, 40 mg, Oral, Daily before Breakfast, [place], [place], NP, 40 mg at 12/01/19 0800    potassium chloride (K-DUR) extended release tablet, 20 mEq, Oral, Daily, [place], [place], NP, 20 mEq at 12/01/19 0272    rOPINIRole (REQUIP) tablet, 0.5 mg, Oral, NIGHTLY, Robb, [place], NP, 0.5 mg at 12/01/19 0029    sennosides-docusate sodium (SENOKOT-S) 8.6-50mg  per tablet, 1 Tablet, Oral, HS PRN, Brent Bulla, NP    Oxygen device:  Nasal Cannula     Lines/Drains/Foley:   Patient Lines/Drains/Airways Status    Active Line / Dialysis Catheter / Dialysis Graft / Drain / Airway / Wound     Name: Placement date: Placement time: Site: Days:    Implantable Port Right Chest  09/25/18   1100   431    Wound (Non-Surgical) Right Back  05/06/18   1335   573                  Labs and Imaging:     Labs:  Date 12/01/19 0800 - 12/02/19 0759   Shift 0800-0759 24 Hour Total   INTAKE   P.O. 240 240     Oral 240 240   Shift Total 240 240   OUTPUT   Shift Total     NET 240 240      CBC:  Recent Labs     11/30/19  1720 12/01/19  0609   WBC 7.1 6.3  HGB 12.1 11.7*   HCT 37.7 35.7   PLTCNT 318 297     Differential:   Recent Labs     11/30/19  1720 12/01/19  0609   PMNS 70 87   LYMPHOCYTES 12 11   MONOCYTES 15 2   EOSINOPHIL 2 0   BASOPHILS 1   0.00 0   0.00   PMNABS 5.00 5.50   LYMPHSABS 0.90 0.70*   MONOSABS 1.10* 0.10*   EOSABS 0.20 0.00     BMP:  Recent Labs     11/30/19  1720 12/01/19  0609   SODIUM 139 137   POTASSIUM 4.2 4.4   CHLORIDE 106 105   CO2 26 25   BUN 12 15   CREATININE 0.74 0.93   GLUCOSENF 122* 234*   ANIONGAP 7 7   GFR >60 >60     Recent Labs     11/30/19  1720 12/01/19  0609   CALCIUM 8.6* 8.7*   MAGNESIUM  --  1.9   PHOSPHORUS  --  2.9     RBC Indices:  Recent Labs     11/30/19  1720 12/01/19  0609   MCV 73.3* 73.1*   MCH  23.5* 24.0*   MCHC 32.1* 32.9   RDW 18.4* 18.6*   MPV 7.1 7.7     Coagulation Studies:  Recent Labs     11/30/19  1720 12/01/19  0609   INR 1.15* 1.44*   APTT 66.7*  --      Liver/Pancreas:  No results for input(s): TOTALPROTEIN, ALBUMIN, PREALBUMIN, AST, ALT, ALKPHOS, LDH, AMYLASE, LIPASE in the last 72 hours.    ABG's  Recent Labs     11/30/19  1720   FI02 28.0   PH 7.40   PCO2 39.00   PO2 41.0   BICARBONATE 24.0     Cardiac Markers  Recent Labs     11/30/19  1720   BNP 107*         Imaging:    CT CHEST WO IV CONTRAST   Final Result   1. New heterogeneous airspace disease in the left lung mostly in the left   lower lobe. This is suspicious for pneumonia.   2. Improved appearance of previous bilateral upper lobe opacities.   3. Stable right hilar prominence which may be from postradiation change.         Radiologist location ID: [id number]         XR AP MOBILE CHEST   Final Result   1. Stable right hilar mass which may represent underlying malignancy.   2. Stable right basilar pleural effusion and/or atelectasis versus   scarring.   3. Developing infiltrate involving the lingual segment of the left upper   lobe of the lung.                        Radiologist location ID: [id number]             I personally reviewed the Radiology Images  :  I personally reviewed the CT chest which showed left lower lobe infiltrates    Microbiology:  No results found for any visits on 11/30/19 (from the past 96 hour(s)).      -Antibiotics:  Antibiotics (last 24 hours)     Date/Time Action Medication Dose Rate    12/01/19 0816 New Bag/New Syringe    ampicillin-sulbactam (UNASYN) 3 g in NS 100 mL IVPB minibag 3 g 400 mL/hr  12/01/19 0028 New Bag/New Syringe    ampicillin-sulbactam (UNASYN) 3 g in NS 100 mL IVPB minibag 3 g 400 mL/hr    11/30/19 1855 New Bag/New Syringe    ampicillin-sulbactam (UNASYN) 3 g in NS 100 mL IVPB minibag 3 g 400 mL/hr    11/30/19 1854 Given    azithromycin (ZITHROMAX) tablet 500 mg         Assessment and Plan      Bilateral pneumonia  Adenocarcinoma of the right lung  History of OSA  Dyspnea  History of asthma  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 old female  who was admitted to the hospital for pneumonia  Currently on Unasyn and azithromycin.  I will check CRP and trended.  Will check mycoplasma antibodies.  Procalcitonin morning  Repeat chest x-ray in the morning  Suggest discontinue Solu-Medrol.  No current clinical signs suggestive of COPD exacerbation.  Will obtain sputum cultures  For COPD I will add Spiriva.  Discontinue Pulmicort and switch the patient to Symbicort.  Start albuterol as needed  Will wean the oxygen down to keep SpO2 above 90%.  If we are able to wean off the oxygen will consider an unattended sleep study to try to qualify the patient for an AutoPAP  Continue apixaban for AFib    The patient was given the opportunity to ask questions which were addressed to the best of my ability. The patient voiced understanding in regards to the treatment plan and agreed to proceed.      Thank you for allowing [place] to assist in the care of this patient. Will follow up the patient. Please page with any additional questions or concerns.      Gillian Scarce, MD 12/01/2019.    This note may have been partially generated using MModal Fluency Direct system, and there may be some incorrect words, spellings, and punctuation that were not noted in checking the note before saving.

## 2019-12-01 NOTE — Care Management Notes (Signed)
Met with patient to discuss pneumonia diagnosis. She claims she has had it before, but thinks she has not had it since she was a child. She claims she does not have COPD, stating she went for testing and was told it was not COPD. She claims she was told she has asthma.   We discussed her current pneumonia diagnosis. She is aware she has been receiving IV antibiotics. She is also aware she will more than likely be here for a few more days. We discussed the possibility of being discharged home with oral antibiotics. She is aware the dosage and duration will vary, but understands it is extremely important to finish her entire prescription. I explained she can try taking it with food if it causes any GI upset or can take pepto/imodium if it causes her to develop any diarrhea. I again stressed the importance of finishing the entire prescription. I explained she needs to follow up with her PCP within one week of discharge to be sure her pneumonia was treated successfully.   She does not have oxygen at home. She stated she would like to get some. I explained the process of testing and qualifying for pneumonia. I explained this would be done closer to discharge. She stated she does have a nebulizer machine at home. She cannot think of any of equipment she would need other than the oxygen.   She claims she takes all of her medications as prescribed. She does have an inhaler she uses at home. She claims she has no difficulty affording or obtaining it.  I provided her with an incentive spirometer. I explained how to use it, encouraging her to do so ten times every hour while awake. I explained why it is so beneficial with a pneumonia diagnosis. I also encouraged her to take it home with her and continue using it there as well.   She claims she has no questions or concerns at this time. I explained she can contact me if any develop.  Particia Nearing, RN  Transition Coach

## 2019-12-01 NOTE — Care Plan (Signed)
VSS throughout shift. Pain controlled with PRN medications. Free from nausea/vomiting. Fall and skin precautions maintained; patient free from falls and any worsening signs of skin breakdown. Patient verbalizes understanding of plan of care, medications, and current condition. Will continue to monitor and assess for developing needs.

## 2019-12-02 LAB — CBC WITH DIFF
BASOPHIL #: 0 10*3/uL (ref 0.00–0.20)
BASOPHIL %: 1 %
EOSINOPHIL #: 0.1 10*3/uL (ref 0.00–0.50)
EOSINOPHIL %: 1 %
HCT: 32.9 % — ABNORMAL LOW (ref 34.6–46.2)
HGB: 10.6 g/dL — ABNORMAL LOW (ref 11.8–15.8)
LYMPHOCYTE #: 1 10*3/uL (ref 0.90–3.40)
LYMPHOCYTE %: 11 %
MCH: 23.6 pg — ABNORMAL LOW (ref 27.6–33.2)
MCHC: 32.1 g/dL — ABNORMAL LOW (ref 32.6–35.4)
MCV: 73.3 fL — ABNORMAL LOW (ref 82.3–96.7)
MONOCYTE #: 1 10*3/uL — ABNORMAL HIGH (ref 0.20–0.90)
MONOCYTE %: 12 %
MPV: 7.5 fL (ref 6.6–10.2)
NEUTROPHIL #: 6.4 10*3/uL (ref 1.50–6.40)
NEUTROPHIL %: 75 %
PLATELETS: 272 10*3/uL (ref 140–440)
RBC: 4.49 10*6/uL (ref 3.80–5.24)
RDW: 18.4 % — ABNORMAL HIGH (ref 12.4–15.2)
WBC: 8.5 10*3/uL (ref 3.5–10.3)

## 2019-12-02 LAB — LEGIONELLA URINE ANTIGEN: LEGIONELLA ANTIGEN: NEGATIVE

## 2019-12-02 LAB — ECG 12 LEAD - ED USE
Calculated P Axis: 20 deg
Calculated T Axis: 53 deg
EKG Severity: BORDERLINE
Heart Rate: 100 {beats}/min
I 40 Axis: 41 deg
PR Interval: 143 ms
QRS Axis: 41 deg
QRS Duration: 90 ms
QT Interval: 354 ms
QTC Calculation: 457 ms
ST Axis: 124 deg
T 40 Axis: 82 deg

## 2019-12-02 LAB — C-REACTIVE PROTEIN(CRP),INFLAMMATION: CRP INFLAMMATION: 20.4 mg/L — ABNORMAL HIGH (ref <=5.0)

## 2019-12-02 LAB — BASIC METABOLIC PANEL
ANION GAP: 7 mmol/L
BUN/CREA RATIO: 18
BUN: 14 mg/dL (ref 10–25)
CALCIUM: 8.6 mg/dL — ABNORMAL LOW (ref 8.8–10.3)
CHLORIDE: 108 mmol/L (ref 98–111)
CO2 TOTAL: 28 mmol/L (ref 21–35)
CREATININE: 0.77 mg/dL (ref <=1.30)
ESTIMATED GFR: >60 mL/min/{1.73_m2}
GLUCOSE: 111 mg/dL — ABNORMAL HIGH (ref 70–110)
POTASSIUM: 4.4 mmol/L (ref 3.5–5.0)
SODIUM: 143 mmol/L (ref 135–145)

## 2019-12-02 LAB — MAGNESIUM: MAGNESIUM: 2 mg/dL (ref 1.8–2.3)

## 2019-12-02 LAB — PROCALCITONIN: PROCALCITONIN: <0.05 ng/mL (ref <0.50)

## 2019-12-02 LAB — PHOSPHORUS: PHOSPHORUS: 3.9 mg/dL (ref 2.5–4.5)

## 2019-12-02 LAB — STREPTOCOCCUS PNEUMONIAE ANTIGEN,URINE: S.PNEUMONIA ANTIGEN: NEGATIVE

## 2019-12-02 NOTE — Consults (Signed)
Pulmonary/critical Care Progress Note       Kelsey [name]  Date of service: 12/02/2019  Date of Admission:  11/30/2019  Hospital Day:  LOS: 2 days     Subjective:   Denies any current dyspnea at rest.  Has dyspnea with minimal activity.  She reports anxiety attack with ambulation this morning.    REVIEW OF SYSTEMS:                 All others are reviewed and are negative.    Vital Signs:  Temp (24hrs) Max:36.9 C (83.3 F)      Systolic (82NKN), LZJ:673 , Min:127 , ALP:379     Diastolic (02IOX), BDZ:32, Min:66, Max:89    Temp  Avg: 36.7 C (98.1 F)  Min: 36.5 C (97.7 F)  Max: 36.9 C (98.4 F)  MAP (Non-Invasive)  Avg: 92.3 mmHG  Min: 83 mmHG  Max: 104 mmHG  Pulse  Avg: 93.8  Min: 85  Max: 108  Resp  Avg: 18  Min: 16  Max: 22  SpO2  Avg: 96.2 %  Min: 95 %  Max: 97 %  Pain Score (Numeric, Faces): 7    I/O:  I/O last 24 hours:      Intake/Output Summary (Last 24 hours) at 12/02/2019 1207  Last data filed at 12/02/2019 0855  Gross per 24 hour   Intake 380 ml   Output --   Net 380 ml       O2 delivery: Nasal Cannula   Physical Exam:  GS: Patient is in no acute distress, oriented   HEENT: Non icteric sclera, no visible oral thrush.  Respiratory: Symmetric chest expansion.  Mild decreased bilateral entry.No wheezes no rales. No current distress   Cardiovascular: RRR , no audible pathologic murmurs.   EXT: No edema in LE.  Gastrointestinal: Abdomen soft, not distended   SKIN The skin has no visible rash or ulcers noted    Lines/Drains/Foley:   Patient Lines/Drains/Airways Status    Active Line / Dialysis Catheter / Dialysis Graft / Drain / Airway / Wound     Name: Placement date: Placement time: Site: Days:    Implantable Port Right Chest  09/25/18   1100   433    Wound (Non-Surgical) Right Back  05/06/18   1335   574                Current Medications:    Current Facility-Administered Medications:     acetaminophen (TYLENOL) tablet, 650 mg, Oral, Q6H PRN, Thamas Jaegers, Hope, NP    albuterol (PROVENTIL) 2.5 mg / 3 mL (0.083%)  neb solution, 2.5 mg, Nebulization, Q4H PRN, Rajon Bisig, MD, 2.5 mg at 12/02/19 0605    ampicillin-sulbactam (UNASYN) 3 g in NS 100 mL IVPB minibag, 3 g, Intravenous, Q6H, Robb, Hope, NP, Stopped at 12/02/19 0616    apixaban (ELIQUIS) tablet, 5 mg, Oral, 2x/day, Robb, Hope, NP, 5 mg at 12/02/19 0848    atorvastatin (LIPITOR) tablet, 10 mg, Oral, Daily, [place], [place], NP, 10 mg at 12/02/19 0848    azithromycin (ZITHROMAX) tablet, 500 mg, Oral, Daily, Robb, Hope, NP, 500 mg at 12/02/19 0848    budesonide-formoterol (SYMBICORT) 80 mcg-4.5 mcg per inhalation oral inhaler, 2 Puff, Inhalation, 2x/day, Ryane Canavan, MD, Stopped at 12/02/19 0800    carvedilol (COREG) tablet, 3.125 mg, Oral, 2x/day-Food, Thamas Jaegers, Hope, NP, 3.125 mg at 12/02/19 0848    docusate sodium (COLACE) capsule, 100 mg, Oral, 3x/day PRN, Brent Bulla, NP    flecainide (  TAMBOCOR) tablet, 100 mg, Oral, 2x/day, Thamas Jaegers, Hope, NP, 100 mg at 12/02/19 0848    guaiFENesin [facility]) extended release tablet - for cough (expectorant), 600 mg, Oral, 2x/day, Thamas Jaegers, Hope, NP, 600 mg at 12/02/19 0848    levothyroxine (SYNTHROID) tablet, 112 mcg, Oral, QAM, Thamas Jaegers, Hope, NP, 112 mcg at 12/02/19 0601    linaclotide (LINZESS) capsule, 72 mcg, Oral, QAM, Thamas Jaegers, Hope, NP, 72 mcg at 12/02/19 0601    lisinopril (PRINIVIL) tablet, 20 mg, Oral, Daily, Thamas Jaegers, [place], NP, 20 mg at 12/02/19 0848    loperamide (IMODIUM) capsule, 2 mg, Oral, Q4H PRN, Thamas Jaegers, Hope, NP    loratadine (CLARITIN) tablet, 10 mg, Oral, Daily, [place], [place], NP, 10 mg at 12/02/19 0848    NS flush syringe, 3 mL, Intracatheter, Q8HRS, Robb, Hope, NP, Stopped at 12/02/19 0600    NS flush syringe, 3 mL, Intracatheter, Q1H PRN, Thamas Jaegers, Hope, NP    ondansetron (ZOFRAN) 2 mg/mL injection, 4 mg, Intravenous, Q6H PRN, Thamas Jaegers, Hope, NP    oxyCODONE (ROXICODONE) immediate release tablet, 5 mg, Oral, Q6H PRN, Brent Bulla, NP, 5 mg at 12/02/19 0054    pantoprazole (PROTONIX) delayed release tablet, 40 mg, Oral, Daily  before Breakfast, [place], [place], NP, 40 mg at 12/02/19 0601    potassium chloride (K-DUR) extended release tablet, 20 mEq, Oral, Daily, Thamas Jaegers, [place], NP, 20 mEq at 12/02/19 0848    rOPINIRole (REQUIP) tablet, 0.5 mg, Oral, NIGHTLY, Thamas Jaegers, [place], NP, 0.5 mg at 12/01/19 2057    sennosides-docusate sodium (SENOKOT-S) 8.6-50mg  per tablet, 1 Tablet, Oral, HS PRN, Brent Bulla, NP    tiotropium bromide (SPIRIVA HANDIHALER) 18 mcg caps with inhalation device, 18 mcg, Inhalation, Daily, Johnn Krasowski, MD, Stopped at 12/02/19 0800    Labs:    Date 12/02/19 0800 - 12/03/19 0759   Shift 0800-0759 24 Hour Total   INTAKE   P.O. 120 120     Oral 120 120   Shift Total 120 120   OUTPUT   Shift Total     NET 120 120      CBC:  Recent Labs     11/30/19  1720 12/01/19  0609 12/02/19  0559   WBC 7.1 6.3 8.5   HGB 12.1 11.7* 10.6*   HCT 37.7 35.7 32.9*   PLTCNT 318 297 272     Differential:   Recent Labs     11/30/19  1720 12/01/19  0609 12/02/19  0559   PMNS 70 87 75   LYMPHOCYTES 12 11 11    MONOCYTES 15 2 12    EOSINOPHIL 2 0 1   BASOPHILS 1   0.00 0   0.00 1   0.00   PMNABS 5.00 5.50 6.40   LYMPHSABS 0.90 0.70* 1.00   MONOSABS 1.10* 0.10* 1.00*   EOSABS 0.20 0.00 0.10     BMP:  Recent Labs     11/30/19  1720 12/01/19  0609 12/02/19  0600   SODIUM 139 137 143   POTASSIUM 4.2 4.4 4.4   CHLORIDE 106 105 108   CO2 26 25 28    BUN 12 15 14    CREATININE 0.74 0.93 0.77   GLUCOSENF 122* 234* 111*   ANIONGAP 7 7 7    GFR >60 >60 >60     Recent Labs     11/30/19  1720 12/01/19  0609 12/02/19  0600   CALCIUM 8.6* 8.7* 8.6*   MAGNESIUM  --  1.9 2.0   PHOSPHORUS  --  2.9 3.9  RBC Indices:  Recent Labs     11/30/19  1720 12/01/19  0609 12/02/19  0559   MCV 73.3* 73.1* 73.3*   MCH 23.5* 24.0* 23.6*   MCHC 32.1* 32.9 32.1*   RDW 18.4* 18.6* 18.4*   MPV 7.1 7.7 7.5     Coagulation Studies:  Recent Labs     11/30/19  1720 12/01/19  0609   INR 1.15* 1.44*   APTT 66.7*  --      Liver/Pancreas:  No results for input(s): TOTALPROTEIN, ALBUMIN, PREALBUMIN, AST,  ALT, ALKPHOS, LDH, AMYLASE, LIPASE in the last 72 hours.    Invalid input(s): GGT  ABG's  No results found for this encounter  Cardiac Markers  Recent Labs     11/30/19  1720 12/01/19  0609   BNP 107* 106*       I have reviewed all labs.      Microbiology:  Hospital Encounter on 11/30/19 (from the past 96 hour(s))   STREPTOCOCCUS PNEUMONIAE ANTIGEN,URINE    Collection Time: 12/01/19  6:25 PM    Specimen: Urine, Site not specified   Culture Result Status    S.PNEUMONIA ANTIGEN Negative Final   LEGIONELLA URINE ANTIGEN    Collection Time: 12/01/19  6:25 PM    Specimen: Urine, Site not specified   Culture Result Status    LEGIONELLA ANTIGEN Negative Final   RESPIRATORY CULTURE AND GRAM STAIN, AEROBIC    Collection Time: 12/02/19 12:20 AM    Specimen: Sputum    Narrative    The following orders were created for panel order RESPIRATORY CULTURE AND GRAM STAIN, AEROBIC.  Procedure                               Abnormality         Status                     ---------                               -----------         ------                     SPUTUM [id number]]                                    Final result                 Please view results for these tests on the individual orders.   RESPIRATORY CULTURE AND GRAM STAIN (PERFORMABLE)    Collection Time: 12/02/19 12:20 AM    Specimen: Sputum   Culture Result Status    GRAM STAIN 1+ Rare WBCs Preliminary    GRAM STAIN 1+ Rare Gram Positive Cocci/Clusters Preliminary       Antibiotics:  Antibiotics (last 24 hours)     Date/Time Action Medication Dose Rate    12/02/19 0848 Given    azithromycin (ZITHROMAX) tablet 500 mg     12/02/19 0601 New Bag/New Syringe    ampicillin-sulbactam (UNASYN) 3 g in NS 100 mL IVPB minibag 3 g 400 mL/hr    12/02/19 0054 New Bag/New Syringe    ampicillin-sulbactam (UNASYN) 3 g in NS 100 mL IVPB minibag 3 g 400 mL/hr  12/01/19 2057 New Bag/New Syringe    ampicillin-sulbactam (UNASYN) 3 g in NS 100 mL IVPB minibag 3 g 400 mL/hr    12/01/19  1340 New Bag/New Syringe    ampicillin-sulbactam (UNASYN) 3 g in NS 100 mL IVPB minibag 3 g 400 mL/hr          Assessment/ Plan:   Bilateral pneumonia  Adenocarcinoma of the right lung  History of OSA  Dyspnea  History of asthma  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 old female  who was admitted to the hospital for pneumonia  Currently on Unasyn and azithromycin.  CRP trending down.  Continue Unasyn and azithromycin for total of 7 days.  Will consult Physical therapy for evaluation  Pending sputum cultures.  Sputum noted with Gram-positive cocci  Continue Symbicort and Spiriva.  Continue albuterol as needed.  Will wean the oxygen down to keep SpO2 above 90%.  Will proceed with a walking challenge test  Will likely need an outpatient polysomnography  Continue apixaban for AFib    Dajaun Goldring, MD4/14/2021        This note may have been partially generated using MModal Fluency Direct system, and there may be some incorrect words, spellings, and punctuation that were not noted in checking the note before saving.

## 2019-12-02 NOTE — Consults (Signed)
[place]  Follow-up note    Kelsey [name], Kelsey [name]  Date of Admission:  11/30/2019  Date of Service: 12/02/2019  Date of Birth:  1950/10/16    Requesting MD: Felipa Evener, MD    Assessment/Plan:  69 y.o. 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  CT chest on admission concerning for pneumonia.  COVID-19 screening test negative.    1. Shortness of breath:  - Likely due to pneumonia.  - currently on Unasyn/azithromycin.   - continue breathing treatment.  Continue supplemental oxygen.    2. Non-small cell lung cancer:  - Currently on maintenance treatment with durvalumab.  Will hold treatment for now.  - durvalumab(immunotherapy) can cause pneumonitis.  Given her x-ray finding and symptom does not fit the picture of pneumonitis.  Will continue current treatment for pneumonia.      DVT/PE ProphylaxisApixaban  Disposition Planning: Home discharge     Subjective:  No new issues overnight.  Overall feeling better.  Denies any worsening shortness of breath or chest pain.  No fevers or chills.    REVIEW OF SYSTEMS:  GENERAL:  no fevers, chills.    HEENT: no sore throat, congestion, blurry vision  Lungs: + shortness of breath, cough, wheezes at this time  GI: no nausea, vomiting, diarrhea, constipation  GU: No dysuria, urgency, increased frequency   Cardiac: no chest pains, palpitations, syncope,   Neuro: no weakness, loss of function, numbness, tingling  Skin: no rash  Musculoskeletal: No myalgias  Psychosocial: No depression, anxiety  Hematologic: No easy bruising, abnormal bleeding  All other review ROS negative except those mentioned above.    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     HTN (hypertension)     Hyperlipidemia     Lung mass     with pleural effusion    Obesity     Sleep apnea     Ventral hernia          Past Surgical History:   Procedure Laterality Date    ABSCESS DRAINAGE      BOWEL RESECTION      CATARACT EXTRACTION      HX CERVICAL CONE BIOPSY       HX CESAREAN SECTION      HX GASTRIC BYPASS      25 years ago    [place] (x2)    HX HYSTERECTOMY      late 79s    HX LAP BANDING      2013    [place]      2013 to remove lap band         Medications Prior to Admission     Prescriptions    albuterol sulfate (PROVENTIL OR VENTOLIN OR PROAIR) 90 mcg/actuation Inhalation HFA Aerosol Inhaler    INHALE 1 TO 2 PUFFS BY MOUTH EVERY 6 HOURS AS NEEDED    albuterol sulfate (PROVENTIL) 2.5 mg /3 mL (0.083 %) Inhalation Solution for Nebulization    3 mL (2.5 mg  total) by Nebulization route Every 6 hours    apixaban (ELIQUIS) 5 mg Oral Tablet    Take 1 Tab (5 mg total) by mouth Twice daily    atorvastatin (LIPITOR) 10 mg Oral Tablet    Take 10 mg by mouth Once a day    budesonide-formoteroL (SYMBICORT) 160-4.5 mcg/actuation Inhalation HFA Aerosol Inhaler    Take 2 Puffs by inhalation Twice daily    carvediloL (COREG) 3.125 mg Oral Tablet    Take 3.125 mg by mouth Twice daily with food    docusate sodium (COLACE) 100 mg Oral Capsule    Take 100 mg by mouth Three times a day as needed     flecainide (TAMBOCOR) 100 mg Oral Tablet    Take 1 Tab (100 mg total) by mouth Twice daily    fluticasone propionate (FLONASE) 50 mcg/actuation Nasal Spray, Suspension    1 Spray by Each Nostril route Once a day    Patient not taking:  Reported on 12/01/2019    levothyroxine (SYNTHROID) 112 mcg Oral Tablet    Take 1 Tablet (112 mcg total) by mouth Every morning    linaCLOtide (LINZESS) 72 mcg Oral Capsule    Take 72 mcg by mouth Every morning    lisinopriL (PRINIVIL) 20 mg Oral Tablet    Take 20 mg by mouth Once a  day    loperamide (IMODIUM) 2 mg Oral Capsule    Take 2 mg by mouth Every 4 hours as needed    loratadine (CLARITIN) 10 mg Oral Tablet    TAKE 1 TABLET BY MOUTH EVERY DAY    magnesium oxide (MAG-OX) 400 mg Oral Tablet    Take 400 mg by mouth Once a day    omeprazole (PRILOSEC) 20 mg Oral Capsule, Delayed Release(E.C.)    Take 40 mg by mouth Once a day     oxyCODONE (ROXICODONE) 5 mg Oral Tablet    Take 1 Tablet (5 mg total) by mouth Every 6 hours as needed for Pain    potassium chloride (K-DUR) 20 mEq Oral Tab Sust.Rel. Particle/Crystal    Take 20 mEq by mouth Once a day Takes 3 days a week    prochlorperazine (COMPAZINE) 10 mg Oral Tablet    Take 1 Tab (10 mg total) by mouth Four times a day as needed for Nausea/Vomiting    rOPINIRole (REQUIP) 0.5 mg Oral Tablet    Take 0.5 mg by mouth Every night        acetaminophen (TYLENOL) tablet, 650 mg, Oral, Q6H PRN  albuterol (PROVENTIL) 2.5 mg / 3 mL (0.083%) neb solution, 2.5 mg, Nebulization, Q4H PRN  ampicillin-sulbactam (UNASYN) 3 g in NS 100 mL IVPB minibag, 3 g, Intravenous, Q6H  apixaban (ELIQUIS) tablet, 5 mg, Oral, 2x/day  atorvastatin (LIPITOR) tablet, 10 mg, Oral, Daily  azithromycin (ZITHROMAX) tablet, 500 mg, Oral, Daily  budesonide-formoterol (SYMBICORT) 80 mcg-4.5 mcg per inhalation oral inhaler, 2 Puff, Inhalation, 2x/day  carvedilol (COREG) tablet, 3.125 mg, Oral, 2x/day-Food  docusate sodium (COLACE) capsule, 100 mg, Oral, 3x/day PRN  flecainide (TAMBOCOR) tablet, 100 mg, Oral, 2x/day  guaiFENesin (MUCINEX) extended release tablet - for cough (expectorant), 600 mg, Oral, 2x/day  levothyroxine (SYNTHROID) tablet, 112 mcg, Oral, QAM  linaclotide (LINZESS) capsule, 72 mcg, Oral, QAM  lisinopril (PRINIVIL) tablet, 20 mg, Oral, Daily  loperamide (IMODIUM) capsule, 2 mg, Oral, Q4H PRN  loratadine (CLARITIN) tablet, 10 mg, Oral, Daily  NS flush syringe, 3 mL, Intracatheter, Q8HRS  NS flush syringe, 3 mL, Intracatheter,  Q1H PRN  ondansetron (ZOFRAN) 2 mg/mL  injection, 4 mg, Intravenous, Q6H PRN  oxyCODONE (ROXICODONE) immediate release tablet, 5 mg, Oral, Q6H PRN  pantoprazole (PROTONIX) delayed release tablet, 40 mg, Oral, Daily before Breakfast  potassium chloride (K-DUR) extended release tablet, 20 mEq, Oral, Daily  rOPINIRole (REQUIP) tablet, 0.5 mg, Oral, NIGHTLY  sennosides-docusate sodium (SENOKOT-S) 8.6-50mg  per tablet, 1 Tablet, Oral, HS PRN  tiotropium bromide (SPIRIVA HANDIHALER) 18 mcg caps with inhalation device, 18 mcg, Inhalation, Daily      Allergies   Allergen Reactions    Shellfish Containing Products Shortness of Breath, Rash and Itching     scallops    Vancomycin Shortness of Breath    Barium Iodide     Iodine And Iodide Containing Products      Itching, shortness of breath      Family History  Family Medical History:     Problem Relation (Age of Onset)    Diabetes Mother, Maternal Grandmother, Maternal Grandfather, Father, Sister    Heart Attack Mother    Stroke Father          Social History  Social History     Socioeconomic History    Marital status: Married     Spouse name: Not on file    Number of children: Not on file    Years of education: Not on file    Highest education level: Not on file   Occupational History    Not on file   Tobacco Use    Smoking status: Former Smoker    Smokeless tobacco: Never Used    Tobacco comment: quit 10 yrs ago   Vaping Use    Vaping Use: Never used   Substance and Sexual Activity    Alcohol use: Not Currently     Comment: rarely    Drug use: No    Sexual activity: Not Currently   Other Topics Concern    Ability to Walk 1 Flight of Steps without SOB/CP No    Routine Exercise Not Asked    Ability to Walk 2 Flight of Steps without SOB/CP Not Asked    Unable to Ambulate Not Asked    Total Care Not Asked    Ability To Do Own ADL's Yes    Uses Walker Not Asked    Other Activity Level Not Asked    Uses Cane Not Asked   Social History Narrative    Not on file     Social Determinants of Health       Financial Resource Strain:     Difficulty of Paying Living Expenses:    Food Insecurity:     Worried About Charity fundraiser in the Last Year:     Arboriculturist in the Last Year:    Transportation Needs:     Film/video editor (Medical):     Lack of Transportation (Non-Medical):    Physical Activity:     Days of Exercise per Week:     Minutes of Exercise per Session:    Stress:     Feeling of Stress :    Intimate Partner Violence:     Fear of Current or Ex-Partner:     Emotionally Abused:     Physically Abused:     Sexually Abused:        Objective     EXAM  VITALS:    Temperature: 37 C (98.6 F)  Heart Rate: 91  BP (Non-Invasive): 134/63  Respiratory Rate:  18  SpO2: 94 %  Pain Score (Numeric, Faces): 7  Constitutional:  appears chronically ill  Neck:  no thyromegaly or lymphadenopathy  Respiratory:  Clear to auscultation bilaterally.   Cardiovascular:  regular rate and rhythm  Gastrointestinal:  Soft, non-tender  Neurologic:  Grossly normal  Lymphatic/Immunologic/Hematologic:  No lymphadenopathy    IMAGES:   CT CHEST WO IV CONTRAST   Final Result   1. New heterogeneous airspace disease in the left lung mostly in the left   lower lobe. This is suspicious for pneumonia.   2. Improved appearance of previous bilateral upper lobe opacities.   3. Stable right hilar prominence which may be from postradiation change.         Radiologist location ID: [id number]         XR AP MOBILE CHEST   Final Result   1. Stable right hilar mass which may represent underlying malignancy.   2. Stable right basilar pleural effusion and/or atelectasis versus   scarring.   3. Developing infiltrate involving the lingual segment of the left upper   lobe of the lung.                        Radiologist location ID: [id number]               LABS:    Lab Results Today:    Results for orders placed or performed during the hospital encounter of 11/30/19 (from the past 24 hour(s))   POC BLOOD GLUCOSE (RESULTS)   Result Value Ref  Range    GLUCOSE, POC 107 70 - 110 mg/dl   PROCALCITONIN   Result Value Ref Range    PROCALCITONIN  <0.05 <0.50 ng/mL   STREPTOCOCCUS PNEUMONIAE ANTIGEN,URINE    Specimen: Urine, Site not specified   Result Value Ref Range    S.PNEUMONIA ANTIGEN Negative Negative, Indeterminate   LEGIONELLA URINE ANTIGEN    Specimen: Urine, Site not specified   Result Value Ref Range    LEGIONELLA ANTIGEN Negative Negative, Indeterminate   RESPIRATORY CULTURE AND GRAM STAIN (PERFORMABLE)    Specimen: Sputum   Result Value Ref Range    GRAM STAIN 1+ Rare WBCs     GRAM STAIN 1+ Rare Gram Positive Cocci/Clusters    PROCALCITONIN   Result Value Ref Range    PROCALCITONIN  <0.05 <0.50 ng/mL   CBC WITH DIFF   Result Value Ref Range    WBC 8.5 3.5 - 10.3 x103/uL    RBC 4.49 3.80 - 5.24 x106/uL    HGB 10.6 (L) 11.8 - 15.8 g/dL    HCT 32.9 (L) 34.6 - 46.2 %    MCV 73.3 (L) 82.3 - 96.7 fL    MCH 23.6 (L) 27.6 - 33.2 pg    MCHC 32.1 (L) 32.6 - 35.4 g/dL    RDW 18.4 (H) 12.4 - 15.2 %    PLATELETS 272 140 - 440 x103/uL    MPV 7.5 6.6 - 10.2 fL    NEUTROPHIL % 75 %    LYMPHOCYTE % 11 %    MONOCYTE % 12 %    EOSINOPHIL % 1 %    BASOPHIL % 1 %    NEUTROPHIL # 6.40 1.50 - 6.40 x103/uL    LYMPHOCYTE # 1.00 0.90 - 3.40 x103/uL    MONOCYTE # 1.00 (H) 0.20 - 0.90 x103/uL    EOSINOPHIL # 0.10 0.00 - 0.50 x103/uL    BASOPHIL # 0.00 0.00 - 0.20  I297/LG   BASIC METABOLIC PANEL   Result Value Ref Range    SODIUM 143 135 - 145 mmol/L    POTASSIUM 4.4 3.5 - 5.0 mmol/L    CHLORIDE 108 98 - 111 mmol/L    CO2 TOTAL 28 21 - 35 mmol/L    ANION GAP 7 mmol/L    CALCIUM 8.6 (L) 8.8 - 10.3 mg/dL    GLUCOSE 111 (H) 70 - 110 mg/dL    BUN 14 10 - 25 mg/dL    CREATININE 0.77 <=1.30 mg/dL    BUN/CREA RATIO 18     ESTIMATED GFR >60 Avg: 85 mL/min/1.62m2   PHOSPHORUS   Result Value Ref Range    PHOSPHORUS 3.9 2.5 - 4.5 mg/dL   MAGNESIUM   Result Value Ref Range    MAGNESIUM 2.0 1.8 - 2.3 mg/dL   C-REACTIVE PROTEIN(CRP),INFLAMMATION   Result Value Ref Range    CRP  INFLAMMATION 20.4 (H) <=5.0 mg/L         Vanetta Mulders, MD

## 2019-12-02 NOTE — Care Plan (Signed)
Restorative Nursing Note        Today's Activity- Pt amb 180 ft in hallway with fww / 1 standing rest break / Pt was on ra and did not want 02 on to amb / 02 apply in room upon return       Tolerance- well       Pain Score Pre Activity- 0      Pain Score Post Activity- 0

## 2019-12-02 NOTE — Care Plan (Signed)
Problem: Adult Inpatient Plan of Care  Goal: Plan of Care Review  Outcome: Ongoing (see interventions/notes)  Goal: Patient-Specific Goal (Individualized)  Outcome: Ongoing (see interventions/notes)  Goal: Absence of Hospital-Acquired Illness or Injury  Outcome: Ongoing (see interventions/notes)  Goal: Optimal Comfort and Wellbeing  Outcome: Ongoing (see interventions/notes)  Goal: Rounds/Family Conference  Outcome: Ongoing (see interventions/notes)     Problem: Breathing Pattern Ineffective  Goal: Effective Breathing Pattern  Outcome: Ongoing (see interventions/notes)     Problem: Fall Injury Risk  Goal: Absence of Fall and Fall-Related Injury  Outcome: Ongoing (see interventions/notes)     Problem: Fluid Imbalance (Pneumonia)  Goal: Fluid Balance  Outcome: Ongoing (see interventions/notes)     Problem: Infection (Pneumonia)  Goal: Resolution of Infection Signs and Symptoms  Outcome: Ongoing (see interventions/notes)     Problem: Respiratory Compromise (Pneumonia)  Goal: Effective Oxygenation and Ventilation  Outcome: Ongoing (see interventions/notes)

## 2019-12-02 NOTE — Nurses Notes (Signed)
I have reviewed this patient's orders and plan of care.  Currently this patient meets requirements for a low to mid level of nursing care.    Ethridge Sollenberger, RN

## 2019-12-02 NOTE — Care Plan (Signed)
[name]  Physical Therapy Initial Evaluation    Patient Name: Kelsey [name]  Date of Birth: [birth year]  Height: Height: 157.5 cm (_0 )  Weight: Weight: 113 kg (248 lb 8 oz)  Room/Bed: 4127/A  Payor: MEDICARE / Plan: [place] MEDICARE PART A AND B / Product Type: Medicare /     Assessment:      Pt demonstrated ability to ambulate functional and household distances using FWW. Pt desat on room air with activity to 88, however able to recover within 30 sec. Pt performed tolieting on SBA. Pt completed bed mobility and transfers with CGA-SBA. Recommend HH. Recommend O2 testing prior to leaving.    Discharge Needs:    Equipment Recommendation: front wheeled walker          Discharge Disposition: home with home health, home with assist    JUSTIFICATION OF DISCHARGE RECOMMENDATION   Based on current diagnosis, functional performance prior to admission, and current functional performance, this patient requires continued PT services in home with home health, home with assist in order to achieve significant functional improvements in these deficit areas: aerobic capacity/endurance, gait, locomotion, and balance.        Plan:   Current Intervention: balance training, bed mobility training, gait training, home exercise program, neuromuscular re-education, patient/family education, postural re-education, ROM (range of motion), stair training, strengthening, stretching, transfer training  To provide physical therapy services minimum of 3x/week  for duration of until goals are met.    The risks/benefits of therapy have been discussed with the patient/caregiver and he/she is in agreement with the established plan of care.       Subjective & Objective        12/02/19 1552   Therapist Pager   PT Assigned/ Pager # Ashtin Rosner [phone]   Rehab Session   Document Type evaluation   Total PT Minutes:   ([id number])   Patient Effort good   Symptoms Noted During/After Treatment shortness of breath   General Information     Patient Profile Reviewed yes   Onset of Illness/Injury or Date of Surgery 11/30/19   Patient/Family/Caregiver Comments/Observations Pt agreeable to PT. Pt reports SOB with mobility   Pertinent History of Current Functional Problem Pt admitted due to pnu. PMHx: lung CA, AFib, HTN, HLD,   Medical Lines PIV Line   Respiratory Status room air   Existing Precautions/Restrictions fall precautions   Mutuality/Individual Preferences   Individualized Care Needs FWW/assist x 1   Living Environment   Lives With spouse   Living Arrangements house   Home Assessment: No Problems Identified   Home Accessibility ramps present at home   Functional Level Prior   Ambulation 0 - independent   Transferring 0 - independent   Toileting 0 - independent   Bathing 0 - independent   Dressing 0 - independent   Eating 0 - independent   Communication 0 - understands/communicates without difficulty   Self-Care   Usual Activity Tolerance good   Current Activity Tolerance moderate   Equipment Currently Used at Home no   Pre Treatment Status   Pre Treatment Patient Status Patient supine in bed   Support Present Pre Treatment  None   Cognitive Assessment/Interventions   Behavior/Mood Observations behavior appropriate to situation, WNL/WFL   Orientation Status oriented x 4   Attention WNL/WFL   Follows Commands WNL   Vital Signs   Post SpO2 (%) 88   O2 Delivery Post Treatment room air   Vitals Comment Pt  recovered within 30 sec. Pt placed on 2L post ambulation.   Pain Assessment   Pretreatment Pain Rating 0/10 - no pain   Posttreatment Pain Rating 0/10 - no pain   RUE Assessment   RUE Assessment WFL for stated baseline   LUE Assessment   LUE Assessment WFL for stated baseline   RLE Assessment   RLE Assessment WFL for stated baseline   LLE Assessment   LLE Assessment WFL for stated baseline   Bed Mobility Assessment/Treatment   Bed Mobility, Assistive Device Head of Bed Elevated   Supine-Sit Independence contact guard assist   Sit to Supine,  Independence not tested   Safety Issues decreased use of arms for pushing/pulling;decreased use of legs for bridging/pushing;impaired trunk control for bed mobility   Transfer Assessment/Treatment   Sit-Stand Independence stand-by assistance   Stand-Sit Independence stand-by assistance   Sit-Stand-Sit, Assist Device walker, front wheeled   Toilet Transfer Independence contact guard assist   Toilet Transfer Assist Device walker, front wheeled   Transfer Impairments balance impaired;endurance;strength decreased   Transfer Comment Limited by fatigue   Gait Assessment/Treatment   Independence  stand-by assistance   Assistive Device  walker, front wheeled   Distance in Feet 128fx2   Deviations  cadence decreased;step length decreased;stride length decreased   Impairments  balance impaired;endurance;strength decreased   Comment Pt desta on room air. Recovered quickly. Pt placed on 2L O2 via NC. Pt ahd no LOB.   Balance Skill Training   Sitting Balance: Static fair + balance   Sitting, Dynamic (Balance) fair balance   Sit-to-Stand Balance fair balance   Standing Balance: Static fair balance   Standing Balance: Dynamic fair balance   Post Treatment Status   Post Treatment Patient Status Patient sitting in bedside chair or w/c;Call light within reach;Sitter select activated;Telephone within reach   Support Present Post Treatment  None   Plan of Care Review   Plan Of Care Reviewed With patient   Physical Therapy Clinical Impression   Assessment Pt demonstrated ability to ambulate functional and household distances using FWW. Pt desat on room air with activity to 88, however able to recover within 30 sec. Pt performed tolieting on SBA. Pt completed bed mobility and transfers with CGA-SBA. Recommend HH. Recommend O2 testing prior to leaving.   Criteria for Skilled Therapeutic yes;meets criteria   Pathology/Pathophysiology Noted musculoskeletal;neuromuscular;cardiovascular;pulmonary   Impairments Found (describe specific  impairments) aerobic capacity/endurance;gait, locomotion, and balance   Functional Limitations in Following  self-care   Rehab Potential good, to achieve stated therapy goals   Therapy Frequency minimum of 3x/week   Predicted Duration of Therapy Intervention (days/wks) until goals are met   Anticipated Equipment Needs at Discharge (PT) front wheeled walker   Anticipated Discharge Disposition home with home health;home with assist   Evaluation Complexity Justification   Patient History: Co-morbidity/factors that impact Plan of Care 3 or more that impact Plan of Care   Examination Components 3 or more Exam elements addressed   Presentation Stable: Uncomplicated, straight-forward, problem focused   Clinical Decision Making Moderate complexity   Evaluation Complexity Moderate complexity   Care Plan Goals   PT Rehab Goals Bed Mobility Goal;Gait Training Goal;Stairs Training Goal;Transfer Training Goal   Bed Mobility Goal   Bed Mobility Goal, Date Established 12/02/19   Bed Mobility Goal, Time to Achieve 30 days   Bed Mobility Goal, Activity Type all bed mobility activities   Bed Mobility Goal, Independence Level modified independence   Gait Training  Goal, Distance  to Achieve   Gait Training  Goal, Date Established 12/02/19   Gait Training  Goal, Time to Achieve 30 days   Gait Training  Goal, Independence Level modified independence   Gait Training  Goal, Assist Device least restricted assistive device   Gait Training  Goal, Distance to Achieve 300   Stairs Training Goal   Stairs Training Goal, Date Established 12/02/19   Stairs Training Goal, Time to Achieve 30 days   Stairs Training Goal, Independence Level modified independence   Stairs Training Goal, Assist Device least restricted assistive device   Stairs Training Goal, Number of Stairs to Achieve 4   Transfer Training Goal   Transfer Training Goal, Date Established 12/02/19   Transfer Training Goal, Time to Achieve 30 days   Transfer Training Goal, Activity Type  all transfers   Transfer Training Goal, Independence Level modified independence   Transfer Training Goal, Assist Device least restrictrictive assistive device   Planned Therapy Interventions, PT Eval   Planned Therapy Interventions (PT) balance training;bed mobility training;gait training;home exercise program;neuromuscular re-education;patient/family education;postural re-education;ROM (range of motion);stair training;strengthening;stretching;transfer training       Therapist:   Cedric Fishman, PT 12/02/2019 15:57  Pager #: [phone]

## 2019-12-02 NOTE — Progress Notes (Signed)
[facility]  HOSPITALIST PROGRESS NOTE      Kelsey [name], Kelsey [name], 69 y.o. female  Date of Admission:  11/30/2019  Date of service: 12/02/2019  Date of Birth:  May 26, 1951    Hospital Day:  LOS: 2 days     Assessment/Plan:    Pneumonia, community acquired  Lung Cancer  COPD, acute exacerbation - ruled out.  Paroxysmal Atrial Fibrillation  Hypertension  Hyperlipidemia  Hypothyroidism  BMI of 45  - Continue Unasyn and azithromycin (start date 11/30/19)  - Pulmonary consult per patient request. Appreciate recs.  - Continue Mucinex, bronchodilators, solu-medrol was discontinued.  - Oxycodone for pain control.  - Oncology consultation for continuity of care. Appreciate recs.  - Monitor on telemetry. Continue flecainide and apixaban.  - Continue Lipitor for hyperlipidemia  - Continue Synthroid for hypothyroidism.    DVT/PE Prophylaxis: Apixaban  GI: Proton Pump inhibitor   Disposition Planning: Home discharge     Subjective   Subjective:  Not feeling the best this morning. Weak, frail.  No increased shortness of breath. No n/v. Not eating that well.    ROS: Other than ROS in the HPI, all other systems were negative.    Current Medications:  acetaminophen (TYLENOL) tablet, 650 mg, Oral, Q6H PRN  albuterol (PROVENTIL) 2.5 mg / 3 mL (0.083%) neb solution, 2.5 mg, Nebulization, Q4H PRN  ampicillin-sulbactam (UNASYN) 3 g in NS 100 mL IVPB minibag, 3 g, Intravenous, Q6H  apixaban (ELIQUIS) tablet, 5 mg, Oral, 2x/day  atorvastatin (LIPITOR) tablet, 10 mg, Oral, Daily  azithromycin (ZITHROMAX) tablet, 500 mg, Oral, Daily  budesonide-formoterol (SYMBICORT) 80 mcg-4.5 mcg per inhalation oral inhaler, 2 Puff, Inhalation, 2x/day  carvedilol (COREG) tablet, 3.125 mg, Oral, 2x/day-Food  docusate sodium (COLACE) capsule, 100 mg, Oral, 3x/day PRN  flecainide (TAMBOCOR) tablet, 100 mg, Oral, 2x/day  guaiFENesin (MUCINEX) extended release tablet - for cough (expectorant), 600 mg, Oral, 2x/day  levothyroxine (SYNTHROID) tablet, 112 mcg,  Oral, QAM  linaclotide (LINZESS) capsule, 72 mcg, Oral, QAM  lisinopril (PRINIVIL) tablet, 20 mg, Oral, Daily  loperamide (IMODIUM) capsule, 2 mg, Oral, Q4H PRN  loratadine (CLARITIN) tablet, 10 mg, Oral, Daily  NS flush syringe, 3 mL, Intracatheter, Q8HRS  NS flush syringe, 3 mL, Intracatheter, Q1H PRN  ondansetron (ZOFRAN) 2 mg/mL injection, 4 mg, Intravenous, Q6H PRN  oxyCODONE (ROXICODONE) immediate release tablet, 5 mg, Oral, Q6H PRN  pantoprazole (PROTONIX) delayed release tablet, 40 mg, Oral, Daily before Breakfast  potassium chloride (K-DUR) extended release tablet, 20 mEq, Oral, Daily  rOPINIRole (REQUIP) tablet, 0.5 mg, Oral, NIGHTLY  sennosides-docusate sodium (SENOKOT-S) 8.6-50mg  per tablet, 1 Tablet, Oral, HS PRN  tiotropium bromide (SPIRIVA HANDIHALER) 18 mcg caps with inhalation device, 18 mcg, Inhalation, Daily        Allergies   Allergen Reactions    Shellfish Containing Products Shortness of Breath, Rash and Itching     scallops    Vancomycin Shortness of Breath    Barium Iodide     Iodine And Iodide Containing Products      Itching, shortness of breath        Objective   Objective:    Vital Signs:  Temperature: 36.5 C (97.7 F)  Heart Rate: (!) 108  BP (Non-Invasive): 137/66  Respiratory Rate: (!) 22  SpO2: 95 %  Pain Score (Numeric, Faces): 7  Liter Flow (L/Min): 2    Intake & Output:    Intake/Output Summary (Last 24 hours) at 12/02/2019 0958  Last data filed at 12/02/2019 1610  Gross  per 24 hour   Intake 620 ml   Output --   Net 620 ml     I/O current shift:  04/14 0700 - 04/14 1859  In: 120 [P.O.:120]  Out: -   Emesis:    BM:       Heme:      Today's Physical Exam:    Alert and oriented x3.  No acute distress.  Pleasant.  Mood affect and speech were all normal.  Pupils are equal reactive to light.  Extraocular movements intact.  Regular rate, no murmurs.  Lungs clear but decreased breath sounds at bases.  Abdomen is soft, nontender, nondistended.  Extremities are warm.  Good pulses.  No  edema.  Skin has no rashes.  No jaundice.  Cranial nerves 2-12 grossly intact.    Labs:  Lab Results Today:    Results for orders placed or performed during the hospital encounter of 11/30/19 (from the past 24 hour(s))   POC BLOOD GLUCOSE (RESULTS)   Result Value Ref Range    GLUCOSE, POC 107 70 - 110 mg/dl   PROCALCITONIN   Result Value Ref Range    PROCALCITONIN  <0.05 <0.50 ng/mL   STREPTOCOCCUS PNEUMONIAE ANTIGEN,URINE    Specimen: Urine, Site not specified   Result Value Ref Range    S.PNEUMONIA ANTIGEN Negative Negative, Indeterminate   LEGIONELLA URINE ANTIGEN    Specimen: Urine, Site not specified   Result Value Ref Range    LEGIONELLA ANTIGEN Negative Negative, Indeterminate   PROCALCITONIN   Result Value Ref Range    PROCALCITONIN  <0.05 <0.50 ng/mL   CBC WITH DIFF   Result Value Ref Range    WBC 8.5 3.5 - 10.3 x103/uL    RBC 4.49 3.80 - 5.24 x106/uL    HGB 10.6 (L) 11.8 - 15.8 g/dL    HCT 32.9 (L) 34.6 - 46.2 %    MCV 73.3 (L) 82.3 - 96.7 fL    MCH 23.6 (L) 27.6 - 33.2 pg    MCHC 32.1 (L) 32.6 - 35.4 g/dL    RDW 18.4 (H) 12.4 - 15.2 %    PLATELETS 272 140 - 440 x103/uL    MPV 7.5 6.6 - 10.2 fL    NEUTROPHIL % 75 %    LYMPHOCYTE % 11 %    MONOCYTE % 12 %    EOSINOPHIL % 1 %    BASOPHIL % 1 %    NEUTROPHIL # 6.40 1.50 - 6.40 x103/uL    LYMPHOCYTE # 1.00 0.90 - 3.40 x103/uL    MONOCYTE # 1.00 (H) 0.20 - 0.90 x103/uL    EOSINOPHIL # 0.10 0.00 - 0.50 x103/uL    BASOPHIL # 0.00 0.00 - 0.20 I948/NI   BASIC METABOLIC PANEL   Result Value Ref Range    SODIUM 143 135 - 145 mmol/L    POTASSIUM 4.4 3.5 - 5.0 mmol/L    CHLORIDE 108 98 - 111 mmol/L    CO2 TOTAL 28 21 - 35 mmol/L    ANION GAP 7 mmol/L    CALCIUM 8.6 (L) 8.8 - 10.3 mg/dL    GLUCOSE 111 (H) 70 - 110 mg/dL    BUN 14 10 - 25 mg/dL    CREATININE 0.77 <=1.30 mg/dL    BUN/CREA RATIO 18     ESTIMATED GFR >60 Avg: 85 mL/min/1.65m2   PHOSPHORUS   Result Value Ref Range    PHOSPHORUS 3.9 2.5 - 4.5 mg/dL   MAGNESIUM   Result Value Ref  Range    MAGNESIUM  2.0 1.8 - 2.3 mg/dL   C-REACTIVE PROTEIN(CRP),INFLAMMATION   Result Value Ref Range    CRP INFLAMMATION 20.4 (H) <=5.0 mg/L       Current Diet Order:  MNT PROTOCOL FOR DIETITIAN  DIET CARDIAC (2G NA, LOW FAT, LOW CHOL, LOW CAFFEINE)    Felipa Evener, MD

## 2019-12-02 NOTE — Nurses Notes (Signed)
Patient transferred last night from 6N. Patient received PRN albuterol treatments for SOB and wheezing. Right chest port draws and infuses without difficulty. VSS. Alert and oriented and ambulates with standby assist. I have reviewed this patient's orders and plan of care.  Currently this patient meets requirements for a mid to high of nursing care.  The patient will continue to be monitored and re-evaluated for any changes.  Louie Boston, RN

## 2019-12-03 DIAGNOSIS — C3431 Malignant neoplasm of lower lobe, right bronchus or lung: Secondary | ICD-10-CM

## 2019-12-03 DIAGNOSIS — Z87891 Personal history of nicotine dependence: Secondary | ICD-10-CM

## 2019-12-03 DIAGNOSIS — J150 Pneumonia due to Klebsiella pneumoniae: Principal | ICD-10-CM

## 2019-12-03 DIAGNOSIS — Z7901 Long term (current) use of anticoagulants: Secondary | ICD-10-CM

## 2019-12-03 DIAGNOSIS — Z7951 Long term (current) use of inhaled steroids: Secondary | ICD-10-CM

## 2019-12-03 DIAGNOSIS — G4733 Obstructive sleep apnea (adult) (pediatric): Secondary | ICD-10-CM

## 2019-12-03 DIAGNOSIS — J9611 Chronic respiratory failure with hypoxia: Secondary | ICD-10-CM

## 2019-12-03 DIAGNOSIS — J189 Pneumonia, unspecified organism: Secondary | ICD-10-CM

## 2019-12-03 DIAGNOSIS — R0602 Shortness of breath: Secondary | ICD-10-CM

## 2019-12-03 DIAGNOSIS — C3490 Malignant neoplasm of unspecified part of unspecified bronchus or lung: Secondary | ICD-10-CM

## 2019-12-03 LAB — BASIC METABOLIC PANEL
ANION GAP: 5 mmol/L
BUN/CREA RATIO: 15
BUN: 11 mg/dL (ref 10–25)
CALCIUM: 8.5 mg/dL — ABNORMAL LOW (ref 8.8–10.3)
CHLORIDE: 107 mmol/L (ref 98–111)
CO2 TOTAL: 28 mmol/L (ref 21–35)
CREATININE: 0.73 mg/dL (ref <=1.30)
ESTIMATED GFR: >60 mL/min/{1.73_m2}
GLUCOSE: 108 mg/dL (ref 70–110)
POTASSIUM: 3.9 mmol/L (ref 3.5–5.0)
SODIUM: 140 mmol/L (ref 135–145)

## 2019-12-03 LAB — CBC WITH DIFF
BASOPHIL #: 0 10*3/uL (ref 0.00–0.20)
BASOPHIL %: 1 %
EOSINOPHIL #: 0.1 10*3/uL (ref 0.00–0.50)
EOSINOPHIL %: 2 %
HCT: 32.5 % — ABNORMAL LOW (ref 34.6–46.2)
HGB: 10.4 g/dL — ABNORMAL LOW (ref 11.8–15.8)
LYMPHOCYTE #: 0.7 10*3/uL — ABNORMAL LOW (ref 0.90–3.40)
LYMPHOCYTE %: 10 %
MCH: 23.6 pg — ABNORMAL LOW (ref 27.6–33.2)
MCHC: 32 g/dL — ABNORMAL LOW (ref 32.6–35.4)
MCV: 73.7 fL — ABNORMAL LOW (ref 82.3–96.7)
MONOCYTE #: 1.1 10*3/uL — ABNORMAL HIGH (ref 0.20–0.90)
MONOCYTE %: 15 %
MPV: 7.2 fL (ref 6.6–10.2)
NEUTROPHIL #: 5.1 10*3/uL (ref 1.50–6.40)
NEUTROPHIL %: 72 %
PLATELETS: 261 10*3/uL (ref 140–440)
RBC: 4.41 10*6/uL (ref 3.80–5.24)
RDW: 18.4 % — ABNORMAL HIGH (ref 12.4–15.2)
WBC: 7 10*3/uL (ref 3.5–10.3)

## 2019-12-03 LAB — MAGNESIUM: MAGNESIUM: 1.8 mg/dL (ref 1.8–2.3)

## 2019-12-03 LAB — PHOSPHORUS: PHOSPHORUS: 4.4 mg/dL (ref 2.5–4.5)

## 2019-12-03 MED ORDER — UMECLIDINIUM 62.5 MCG/ACTUATION BLISTER POWDER FOR INHALATION
1.00 | DISK | Freq: Every day | RESPIRATORY_TRACT | 4 refills | Status: AC
Start: 2019-12-03 — End: 2020-12-02

## 2019-12-03 MED ORDER — AMOXICILLIN 875 MG-POTASSIUM CLAVULANATE 125 MG TABLET
1.00 | ORAL_TABLET | Freq: Two times a day (BID) | ORAL | 0 refills | Status: AC
Start: 2019-12-03 — End: 2019-12-07

## 2019-12-03 MED ORDER — GUAIFENESIN ER 600 MG TABLET, EXTENDED RELEASE 12 HR
1200.00 mg | EXTENDED_RELEASE_TABLET | Freq: Two times a day (BID) | ORAL | 3 refills | Status: DC
Start: 2019-12-03 — End: 2020-10-27

## 2019-12-03 MED ORDER — TIOTROPIUM BROMIDE 18 MCG CAPS WITH INHALATION DEVICE - EAST
18.00 ug | ORAL_CAPSULE | Freq: Every day | RESPIRATORY_TRACT | 0 refills | Status: DC
Start: 2019-12-04 — End: 2020-10-27

## 2019-12-03 NOTE — Care Management Notes (Signed)
[facility] Management Initial Evaluation    Patient Name: Kelsey [name]  Date of Birth: [birth year]  Sex: female  Date/Time of Admission: 11/30/2019  4:44 PM  Room/Bed: 4127/A  Payor: MEDICARE / Plan: [place] MEDICARE PART A AND B / Product Type: Medicare /   PCP: [place] Info:   Preferred [address], [address]    [place] [address]    Phone: [phone] Fax: [phone]    Hours: Not open 24 hours          Emergency Contact Info:   Extended Emergency Contact Information  Primary Emergency Contact: Kelsey [name]  Address: [place], [place] [address] [address] [place] Phone: [phone]  Work Phone: [phone]  Mobile Phone: [phone]  Relation: Husband  Preferred language: English    History:   Kelsey [name] is a 69 y.o., female, admitted 11/30/2019  //  Height/Weight: 157.5 cm (_0 ) / 113 kg (248 lb 8 oz)     LOS: 3 days   Admitting Diagnosis: Pneumonia [J18.9]    Assessment:   SW met with pt to discuss DC needs. Pt advised that she resides at home with her husband. She added that the home is one level with ramp access. Pt advised that she has a walker, wheelchair, and canes at home to use as needed and she added that she also has a home nebulizer. Pt attends [facility] as her PCP and she prefers [facility] for medications. Pt advised that her husband will provide her transportation home at DC. SW discussed the need for home oxygen at DC. Pt advised that she is agreeable. SW confirmed pt address and contact information. SW provided [facility] for DME and pt advised that she would like to discuss with her husband. SW contacted pt husband via speaker phone while in the room and reviewed the [facility] for DME. After review, pt and spouse selected [facility]. SW completed referral for home  oxygen. Pt denied any additional DC needs.      12/03/19 1554   Readmission   Is this a readmission? No   Social Work Animal nutritionist Status initial meeting   Projected Discharge Date 12/03/19   Anticipated Discharge Disposition Home   Patient choice offered to patient/family yes   Form for patient choice reviewed/signed and on chart yes   Facility or Agency Preferences [facility]   Patient/Family in Agreement with Plan yes   Discharge Needs Assessment   Equipment Currently Used at Home walker, standard;cane, straight;walker, rolling;wheelchair;nebulizer   Equipment Needed After Discharge oxygen   Discharge Facility/Level of Care Needs Home with DME (code 1)   Transportation Available car;family or friend will provide   Referral Information   Admission Type inpatient   Care Manager Assigned to Case Kelsey [name]   Employment/Military   Financial Concerns none   Patient has Prescription Coverage?  Yes        Pharmacy Name [facility]        Name of Insurance Coverage for Medications Medicare   [employer]   Deaf or serious difficulty hearing? N   Blind or serious difficulty seeing even with glasses?  N   Serious difficulty walking/climbing stairs? Y   Difficulty dressing or bathing? N   Has difficulty doing errands because of physical, mental or emotional condition? N   Has serious difficulty concentrating, remembering or making decisions because of physical, mental or emotional condition ? N   Adaptive Devices Used: Walker  (oxygen)   Mutuality/Individual Preferences   Patient-Specific Goals (Include Timeframe) Pt advised that he wants to dc home today if possible- agreeable to home oxygen- [place] With spouse   Living Arrangements mobile home   Able to Return to Prior Arrangements yes   Home Safety   Home Assessment: No Problems Identified       Discharge Plan:  Home with DME (code 1)  Pt to DC home with oxygen provided by [facility].          The patient will continue to be evaluated for developing discharge needs.     Case Manager: Kelsey [name], [place]  Phone: [phone]

## 2019-12-03 NOTE — Progress Notes (Signed)
[facility]  HOSPITALIST PROGRESS NOTE      Kelsey [name], Hege, 69 y.o. female  Date of Admission:  11/30/2019  Date of service: 12/03/2019  Date of Birth:  01-15-1951    Hospital Day:  LOS: 3 days     Assessment/Plan:    Pneumonia, community acquired  Lung Cancer  COPD, acute exacerbation - ruled out.  Paroxysmal Atrial Fibrillation  Hypertension  Hyperlipidemia  Hypothyroidism  BMI of 45  - Continue Unasyn and azithromycin (start date 11/30/19)  - Pulmonary consult per patient request. Appreciate recs.  - Continue Mucinex, bronchodilators, solu-medrol was discontinued.  - Oxycodone for pain control.  - Oncology consultation for continuity of care. Appreciate recs.  - Monitor on telemetry. Continue flecainide and apixaban.  - Continue Lipitor for hyperlipidemia  - Continue Synthroid for hypothyroidism.    Of note, patient's chronic hypoxic respiratory failure is due to underlying COPD. Her acute pneumonia has resolved at this point.      DVT/PE Prophylaxis: Apixaban  GI: Proton Pump inhibitor   Disposition Planning: Home discharge     Subjective   Subjective:  Feels much better today. Wants to go home.    ROS: Other than ROS in the HPI, all other systems were negative.    Current Medications:  acetaminophen (TYLENOL) tablet, 650 mg, Oral, Q6H PRN  albuterol (PROVENTIL) 2.5 mg / 3 mL (0.083%) neb solution, 2.5 mg, Nebulization, Q4H PRN  ampicillin-sulbactam (UNASYN) 3 g in NS 100 mL IVPB minibag, 3 g, Intravenous, Q6H  apixaban (ELIQUIS) tablet, 5 mg, Oral, 2x/day  atorvastatin (LIPITOR) tablet, 10 mg, Oral, Daily  azithromycin (ZITHROMAX) tablet, 500 mg, Oral, Daily  budesonide-formoterol (SYMBICORT) 80 mcg-4.5 mcg per inhalation oral inhaler, 2 Puff, Inhalation, 2x/day  carvedilol (COREG) tablet, 3.125 mg, Oral, 2x/day-Food  docusate sodium (COLACE) capsule, 100 mg, Oral, 3x/day PRN  flecainide (TAMBOCOR) tablet, 100 mg, Oral, 2x/day  guaiFENesin (MUCINEX) extended release tablet - for cough (expectorant), 600  mg, Oral, 2x/day  levothyroxine (SYNTHROID) tablet, 112 mcg, Oral, QAM  linaclotide (LINZESS) capsule, 72 mcg, Oral, QAM  lisinopril (PRINIVIL) tablet, 20 mg, Oral, Daily  loperamide (IMODIUM) capsule, 2 mg, Oral, Q4H PRN  loratadine (CLARITIN) tablet, 10 mg, Oral, Daily  NS flush syringe, 3 mL, Intracatheter, Q8HRS  NS flush syringe, 3 mL, Intracatheter, Q1H PRN  ondansetron (ZOFRAN) 2 mg/mL injection, 4 mg, Intravenous, Q6H PRN  oxyCODONE (ROXICODONE) immediate release tablet, 5 mg, Oral, Q6H PRN  pantoprazole (PROTONIX) delayed release tablet, 40 mg, Oral, Daily before Breakfast  potassium chloride (K-DUR) extended release tablet, 20 mEq, Oral, Daily  rOPINIRole (REQUIP) tablet, 0.5 mg, Oral, NIGHTLY  sennosides-docusate sodium (SENOKOT-S) 8.6-50mg  per tablet, 1 Tablet, Oral, HS PRN  tiotropium bromide (SPIRIVA HANDIHALER) 18 mcg caps with inhalation device, 18 mcg, Inhalation, Daily        Allergies   Allergen Reactions    Shellfish Containing Products Shortness of Breath, Rash and Itching     scallops    Vancomycin Shortness of Breath    Barium Iodide     Iodine And Iodide Containing Products      Itching, shortness of breath        Objective   Objective:    Vital Signs:  Temperature: 36.8 C (98.2 F)  Heart Rate: 90  BP (Non-Invasive): 98/64  Respiratory Rate: 18  SpO2: 96 %  Pain Score (Numeric, Faces): 0  Liter Flow (L/Min): 2    Intake & Output:    Intake/Output Summary (Last 24 hours) at  12/03/2019 1540  Last data filed at 12/03/2019 0900  Gross per 24 hour   Intake 830 ml   Output --   Net 830 ml     I/O current shift:  04/15 0700 - 04/15 1859  In: 80 [P.O.:590]  Out: -   Emesis:    BM:       Heme:      Today's Physical Exam:    Alert and oriented x3.  No acute distress.  Pleasant.  Mood affect and speech were all normal.  Pupils are equal reactive to light.  Extraocular movements intact.  Regular rate, no murmurs.  Lungs clear but decreased breath sounds at bases.  Abdomen is soft, nontender,  nondistended.  Extremities are warm.  Good pulses.  No edema.  Skin has no rashes.  No jaundice.  Cranial nerves 2-12 grossly intact.    Labs:  Lab Results Today:    Results for orders placed or performed during the hospital encounter of 11/30/19 (from the past 24 hour(s))   BASIC METABOLIC PANEL   Result Value Ref Range    SODIUM 140 135 - 145 mmol/L    POTASSIUM 3.9 3.5 - 5.0 mmol/L    CHLORIDE 107 98 - 111 mmol/L    CO2 TOTAL 28 21 - 35 mmol/L    ANION GAP 5 mmol/L    CALCIUM 8.5 (L) 8.8 - 10.3 mg/dL    GLUCOSE 108 70 - 110 mg/dL    BUN 11 10 - 25 mg/dL    CREATININE 0.73 <=1.30 mg/dL    BUN/CREA RATIO 15     ESTIMATED GFR >60 Avg: 85 mL/min/1.69m2   PHOSPHORUS   Result Value Ref Range    PHOSPHORUS 4.4 2.5 - 4.5 mg/dL   MAGNESIUM   Result Value Ref Range    MAGNESIUM 1.8 1.8 - 2.3 mg/dL   CBC WITH DIFF   Result Value Ref Range    WBC 7.0 3.5 - 10.3 x103/uL    RBC 4.41 3.80 - 5.24 x106/uL    HGB 10.4 (L) 11.8 - 15.8 g/dL    HCT 32.5 (L) 34.6 - 46.2 %    MCV 73.7 (L) 82.3 - 96.7 fL    MCH 23.6 (L) 27.6 - 33.2 pg    MCHC 32.0 (L) 32.6 - 35.4 g/dL    RDW 18.4 (H) 12.4 - 15.2 %    PLATELETS 261 140 - 440 x103/uL    MPV 7.2 6.6 - 10.2 fL    NEUTROPHIL % 72 %    LYMPHOCYTE % 10 %    MONOCYTE % 15 %    EOSINOPHIL % 2 %    BASOPHIL % 1 %    NEUTROPHIL # 5.10 1.50 - 6.40 x103/uL    LYMPHOCYTE # 0.70 (L) 0.90 - 3.40 x103/uL    MONOCYTE # 1.10 (H) 0.20 - 0.90 x103/uL    EOSINOPHIL # 0.10 0.00 - 0.50 x103/uL    BASOPHIL # 0.00 0.00 - 0.20 x103/uL       Current Diet Order:  MNT PROTOCOL FOR DIETITIAN  DIET CARDIAC (2G NA, LOW FAT, LOW CHOL, LOW CAFFEINE)    Felipa Evener, MD

## 2019-12-03 NOTE — Consults (Signed)
[place]  Follow-up note    Torra, Pala  Date of Admission:  11/30/2019  Date of Service: 12/03/2019  Date of Birth:  1950-10-12    Requesting MD: Felipa Evener, MD    Assessment/Plan:  69 y.o. 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  CT chest on admission concerning for pneumonia.  COVID-19 screening test negative.  Overall feeling better.  Wants to go home.    1. Shortness of breath:  - Likely due to pneumonia.  - currently on Unasyn/azithromycin- for 7 day as per Pulmonary recommendation  - continue breathing treatment.  Continue supplemental oxygen.  Will likely go home on oxygen.    2. Non-small cell lung cancer:  - Currently on maintenance treatment with durvalumab.  Will hold treatment for now.  - durvalumab(immunotherapy) can cause pneumonitis.  Given her x-ray finding and symptom does not fit the picture of pneumonitis.  Will continue current treatment for pneumonia.      DVT/PE ProphylaxisApixaban  Disposition Planning: Home discharge     Subjective:  No new issues overnight.  Overall feeling better.  Denies any worsening shortness of breath or chest pain.  No fevers or chills.    REVIEW OF SYSTEMS:  GENERAL:  no fevers, chills.    HEENT: no sore throat, congestion, blurry vision  Lungs: + shortness of breath, cough, wheezes at this time  GI: no nausea, vomiting, diarrhea, constipation  GU: No dysuria, urgency, increased frequency   Cardiac: no chest pains, palpitations, syncope,   Neuro: no weakness, loss of function, numbness, tingling  Skin: no rash  Musculoskeletal: No myalgias  Psychosocial: No depression, anxiety  Hematologic: No easy bruising, abnormal bleeding  All other review ROS negative except those mentioned above.    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     HTN (hypertension)     Hyperlipidemia     Lung mass     with pleural effusion    Obesity     Sleep apnea     Ventral hernia          Past Surgical History:   Procedure Laterality Date    ABSCESS DRAINAGE      BOWEL RESECTION      CATARACT EXTRACTION      HX CERVICAL CONE BIOPSY       HX CESAREAN SECTION      HX GASTRIC BYPASS      25 years ago    [place] (x2)    HX HYSTERECTOMY      late 48s    HX LAP BANDING      2013    [place]      2013 to remove lap band         Medications Prior to Admission     Prescriptions    albuterol sulfate (PROVENTIL OR VENTOLIN OR PROAIR) 90 mcg/actuation Inhalation HFA Aerosol Inhaler    INHALE 1 TO 2 PUFFS BY MOUTH EVERY 6 HOURS AS NEEDED  albuterol sulfate (PROVENTIL) 2.5 mg /3 mL (0.083 %) Inhalation Solution for Nebulization    3 mL (2.5 mg total) by Nebulization route Every 6 hours    apixaban (ELIQUIS) 5 mg Oral Tablet    Take 1 Tab (5 mg total) by mouth Twice daily    atorvastatin (LIPITOR) 10 mg Oral Tablet    Take 10 mg by mouth Once a day    budesonide-formoteroL (SYMBICORT) 160-4.5 mcg/actuation Inhalation HFA Aerosol Inhaler    Take 2 Puffs by inhalation Twice daily    carvediloL (COREG) 3.125 mg Oral Tablet    Take 3.125 mg by mouth Twice daily with food    docusate sodium (COLACE) 100 mg Oral Capsule    Take 100 mg by mouth Three times a day as needed     flecainide (TAMBOCOR) 100 mg Oral Tablet    Take 1 Tab (100 mg total) by mouth Twice daily    fluticasone propionate (FLONASE) 50 mcg/actuation Nasal Spray, Suspension    1 Spray by Each Nostril route Once a day    Patient not taking:  Reported on 12/01/2019    levothyroxine (SYNTHROID) 112 mcg Oral Tablet    Take 1 Tablet (112 mcg total) by mouth Every morning    linaCLOtide (LINZESS) 72 mcg Oral  Capsule    Take 72 mcg by mouth Every morning    lisinopriL (PRINIVIL) 20 mg Oral Tablet    Take 20 mg by mouth Once a day    loperamide (IMODIUM) 2 mg Oral Capsule    Take 2 mg by mouth Every 4 hours as needed    loratadine (CLARITIN) 10 mg Oral Tablet    TAKE 1 TABLET BY MOUTH EVERY DAY    magnesium oxide (MAG-OX) 400 mg Oral Tablet    Take 400 mg by mouth Once a day    omeprazole (PRILOSEC) 20 mg Oral Capsule, Delayed Release(E.C.)    Take 40 mg by mouth Once a day     oxyCODONE (ROXICODONE) 5 mg Oral Tablet    Take 1 Tablet (5 mg total) by mouth Every 6 hours as needed for Pain    potassium chloride (K-DUR) 20 mEq Oral Tab Sust.Rel. Particle/Crystal    Take 20 mEq by mouth Once a day Takes 3 days a week    prochlorperazine (COMPAZINE) 10 mg Oral Tablet    Take 1 Tab (10 mg total) by mouth Four times a day as needed for Nausea/Vomiting    rOPINIRole (REQUIP) 0.5 mg Oral Tablet    Take 0.5 mg by mouth Every night        acetaminophen (TYLENOL) tablet, 650 mg, Oral, Q6H PRN  albuterol (PROVENTIL) 2.5 mg / 3 mL (0.083%) neb solution, 2.5 mg, Nebulization, Q4H PRN  ampicillin-sulbactam (UNASYN) 3 g in NS 100 mL IVPB minibag, 3 g, Intravenous, Q6H  apixaban (ELIQUIS) tablet, 5 mg, Oral, 2x/day  atorvastatin (LIPITOR) tablet, 10 mg, Oral, Daily  azithromycin (ZITHROMAX) tablet, 500 mg, Oral, Daily  budesonide-formoterol (SYMBICORT) 80 mcg-4.5 mcg per inhalation oral inhaler, 2 Puff, Inhalation, 2x/day  carvedilol (COREG) tablet, 3.125 mg, Oral, 2x/day-Food  docusate sodium (COLACE) capsule, 100 mg, Oral, 3x/day PRN  flecainide (TAMBOCOR) tablet, 100 mg, Oral, 2x/day  guaiFENesin (MUCINEX) extended release tablet - for cough (expectorant), 600 mg, Oral, 2x/day  levothyroxine (SYNTHROID) tablet, 112 mcg, Oral, QAM  linaclotide (LINZESS) capsule, 72 mcg, Oral, QAM  lisinopril (PRINIVIL) tablet, 20 mg, Oral, Daily  loperamide (IMODIUM) capsule, 2 mg, Oral, Q4H PRN  loratadine (CLARITIN)  tablet, 10 mg, Oral, Daily  NS flush  syringe, 3 mL, Intracatheter, Q8HRS  NS flush syringe, 3 mL, Intracatheter, Q1H PRN  ondansetron (ZOFRAN) 2 mg/mL injection, 4 mg, Intravenous, Q6H PRN  oxyCODONE (ROXICODONE) immediate release tablet, 5 mg, Oral, Q6H PRN  pantoprazole (PROTONIX) delayed release tablet, 40 mg, Oral, Daily before Breakfast  potassium chloride (K-DUR) extended release tablet, 20 mEq, Oral, Daily  rOPINIRole (REQUIP) tablet, 0.5 mg, Oral, NIGHTLY  sennosides-docusate sodium (SENOKOT-S) 8.6-50mg  per tablet, 1 Tablet, Oral, HS PRN  tiotropium bromide (SPIRIVA HANDIHALER) 18 mcg caps with inhalation device, 18 mcg, Inhalation, Daily      Allergies   Allergen Reactions    Shellfish Containing Products Shortness of Breath, Rash and Itching     scallops    Vancomycin Shortness of Breath    Barium Iodide     Iodine And Iodide Containing Products      Itching, shortness of breath      Family History  Family Medical History:     Problem Relation (Age of Onset)    Diabetes Mother, Maternal Grandmother, Maternal Grandfather, Father, Sister    Heart Attack Mother    Stroke Father          Social History  Social History     Socioeconomic History    Marital status: Married     Spouse name: Not on file    Number of children: Not on file    Years of education: Not on file    Highest education level: Not on file   Occupational History    Not on file   Tobacco Use    Smoking status: Former Smoker    Smokeless tobacco: Never Used    Tobacco comment: quit 10 yrs ago   Vaping Use    Vaping Use: Never used   Substance and Sexual Activity    Alcohol use: Not Currently     Comment: rarely    Drug use: No    Sexual activity: Not Currently   Other Topics Concern    Ability to Walk 1 Flight of Steps without SOB/CP No    Routine Exercise Not Asked    Ability to Walk 2 Flight of Steps without SOB/CP Not Asked    Unable to Ambulate Not Asked    Total Care Not Asked    Ability To Do Own ADL's Yes    Uses Walker Not Asked    Other Activity  Level Not Asked    Uses Cane Not Asked   Social History Narrative    Not on file     Social Determinants of Health     Financial Resource Strain:     Difficulty of Paying Living Expenses:    Food Insecurity:     Worried About Charity fundraiser in the Last Year:     Arboriculturist in the Last Year:    Transportation Needs:     Film/video editor (Medical):     Lack of Transportation (Non-Medical):    Physical Activity:     Days of Exercise per Week:     Minutes of Exercise per Session:    Stress:     Feeling of Stress :    Intimate Partner Violence:     Fear of Current or Ex-Partner:     Emotionally Abused:     Physically Abused:     Sexually Abused:        Objective     EXAM  VITALS:  Temperature: 36.7 C (98.1 F)  Heart Rate: 92  BP (Non-Invasive): 136/82  Respiratory Rate: 18  SpO2: 94 %  Pain Score (Numeric, Faces): 0  Constitutional:  appears chronically ill  Neck:  no thyromegaly or lymphadenopathy  Respiratory:  Clear to auscultation bilaterally.   Cardiovascular:  regular rate and rhythm  Gastrointestinal:  Soft, non-tender  Neurologic:  Grossly normal  Lymphatic/Immunologic/Hematologic:  No lymphadenopathy    IMAGES:   CT CHEST WO IV CONTRAST   Final Result   1. New heterogeneous airspace disease in the left lung mostly in the left   lower lobe. This is suspicious for pneumonia.   2. Improved appearance of previous bilateral upper lobe opacities.   3. Stable right hilar prominence which may be from postradiation change.         Radiologist location ID: [id number]         XR AP MOBILE CHEST   Final Result   1. Stable right hilar mass which may represent underlying malignancy.   2. Stable right basilar pleural effusion and/or atelectasis versus   scarring.   3. Developing infiltrate involving the lingual segment of the left upper   lobe of the lung.                        Radiologist location ID: [id number]               LABS:    Lab Results Today:    Results for orders placed or  performed during the hospital encounter of 11/30/19 (from the past 24 hour(s))   BASIC METABOLIC PANEL   Result Value Ref Range    SODIUM 140 135 - 145 mmol/L    POTASSIUM 3.9 3.5 - 5.0 mmol/L    CHLORIDE 107 98 - 111 mmol/L    CO2 TOTAL 28 21 - 35 mmol/L    ANION GAP 5 mmol/L    CALCIUM 8.5 (L) 8.8 - 10.3 mg/dL    GLUCOSE 108 70 - 110 mg/dL    BUN 11 10 - 25 mg/dL    CREATININE 0.73 <=1.30 mg/dL    BUN/CREA RATIO 15     ESTIMATED GFR >60 Avg: 85 mL/min/1.73m2   PHOSPHORUS   Result Value Ref Range    PHOSPHORUS 4.4 2.5 - 4.5 mg/dL   MAGNESIUM   Result Value Ref Range    MAGNESIUM 1.8 1.8 - 2.3 mg/dL   CBC WITH DIFF   Result Value Ref Range    WBC 7.0 3.5 - 10.3 x103/uL    RBC 4.41 3.80 - 5.24 x106/uL    HGB 10.4 (L) 11.8 - 15.8 g/dL    HCT 32.5 (L) 34.6 - 46.2 %    MCV 73.7 (L) 82.3 - 96.7 fL    MCH 23.6 (L) 27.6 - 33.2 pg    MCHC 32.0 (L) 32.6 - 35.4 g/dL    RDW 18.4 (H) 12.4 - 15.2 %    PLATELETS 261 140 - 440 x103/uL    MPV 7.2 6.6 - 10.2 fL    NEUTROPHIL % 72 %    LYMPHOCYTE % 10 %    MONOCYTE % 15 %    EOSINOPHIL % 2 %    BASOPHIL % 1 %    NEUTROPHIL # 5.10 1.50 - 6.40 x103/uL    LYMPHOCYTE # 0.70 (L) 0.90 - 3.40 x103/uL    MONOCYTE # 1.10 (H) 0.20 - 0.90 x103/uL    EOSINOPHIL # 0.10 0.00 - 0.50  x103/uL    BASOPHIL # 0.00 0.00 - 0.20 x103/uL         Vanetta Mulders, MD

## 2019-12-03 NOTE — Consults (Signed)
Pulmonary/critical Care Progress Note       Kelsey [name]  Date of service: 12/03/2019  Date of Admission:  11/30/2019  Hospital Day:  LOS: 3 days     Subjective:   Denies any current dyspnea at rest.  On room air SpO2 96%.  Feeling better and would like to go home    REVIEW OF SYSTEMS:                 All others are reviewed and are negative.    Vital Signs:  Temp (24hrs) Max:37 C (98.4 F)      Systolic (21IZX), YOF:188 , Min:98 , QLR:373     Diastolic (66KDP), TEL:07, Min:41, Max:64    Temp  Avg: 36.9 C (98.4 F)  Min: 36.8 C (98.2 F)  Max: 37 C (98.6 F)  MAP (Non-Invasive)  Avg: 67.3 mmHG  Min: 56 mmHG  Max: 79 mmHG  Pulse  Avg: 90  Min: 88  Max: 91  Resp  Avg: 18  Min: 18  Max: 18  SpO2  Avg: 94.5 %  Min: 92 %  Max: 96 %  Pain Score (Numeric, Faces): 0    I/O:  I/O last 24 hours:      Intake/Output Summary (Last 24 hours) at 12/03/2019 0959  Last data filed at 12/02/2019 1800  Gross per 24 hour   Intake 480 ml   Output --   Net 480 ml       O2 delivery: None (Room Air)   Physical Exam:  GS: Patient is in no acute distress, oriented   HEENT: Non icteric sclera, no visible oral thrush.  Respiratory: Symmetric chest expansion. No wheezes no rales. No current distress   Cardiovascular: RRR , no audible pathologic murmurs.   EXT: No edema in LE.  Gastrointestinal: Abdomen soft, not distended   SKIN The skin has no visible rash or ulcers noted    Lines/Drains/Foley:   Patient Lines/Drains/Airways Status    Active Line / Dialysis Catheter / Dialysis Graft / Drain / Airway / Wound     Name: Placement date: Placement time: Site: Days:    Implantable Port Right Chest  09/25/18   1100   433    Wound (Non-Surgical) Right Back  05/06/18   1335   575                Current Medications:    Current Facility-Administered Medications:     acetaminophen (TYLENOL) tablet, 650 mg, Oral, Q6H PRN, Thamas Jaegers, [place], NP    albuterol (PROVENTIL) 2.5 mg / 3 mL (0.083%) neb solution, 2.5 mg, Nebulization, Q4H PRN, Kaaren Nass, MD, 2.5 mg  at 12/02/19 0605    ampicillin-sulbactam (UNASYN) 3 g in NS 100 mL IVPB minibag, 3 g, Intravenous, Q6H, Robb, Hope, NP, Stopped at 12/03/19 6151    apixaban (ELIQUIS) tablet, 5 mg, Oral, 2x/day, Robb, Hope, NP, 5 mg at 12/03/19 8343    atorvastatin (LIPITOR) tablet, 10 mg, Oral, Daily, [place], [place], NP, 10 mg at 12/03/19 7357    azithromycin (ZITHROMAX) tablet, 500 mg, Oral, Daily, [place], Hope, NP, 500 mg at 12/03/19 8978    budesonide-formoterol (SYMBICORT) 80 mcg-4.5 mcg per inhalation oral inhaler, 2 Puff, Inhalation, 2x/day, Rayette Mogg, MD, 2 Puff at 12/03/19 0832    carvedilol (COREG) tablet, 3.125 mg, Oral, 2x/day-Food, Brent Bulla, NP, Stopped at 12/03/19 0800    docusate sodium (COLACE) capsule, 100 mg, Oral, 3x/day PRN, Brent Bulla, NP    flecainide (TAMBOCOR) tablet, 100  mg, Oral, 2x/day, Thamas Jaegers, [place], NP, 100 mg at 12/03/19 3716    guaiFENesin [facility]) extended release tablet - for cough (expectorant), 600 mg, Oral, 2x/day, Thamas Jaegers, [place], NP, 600 mg at 12/03/19 9678    levothyroxine (SYNTHROID) tablet, 112 mcg, Oral, QAM, Thamas Jaegers, [place], NP, 112 mcg at 12/03/19 0555    linaclotide (LINZESS) capsule, 72 mcg, Oral, QAM, Thamas Jaegers, [place], NP, 72 mcg at 12/03/19 0555    lisinopril (PRINIVIL) tablet, 20 mg, Oral, Daily, Thamas Jaegers, [place], NP, Stopped at 12/03/19 0900    loperamide (IMODIUM) capsule, 2 mg, Oral, Q4H PRN, Thamas Jaegers, [place], NP    loratadine (CLARITIN) tablet, 10 mg, Oral, Daily, [place], [place], NP, 10 mg at 12/03/19 0924    NS flush syringe, 3 mL, Intracatheter, Q8HRS, Robb, Hope, NP, Stopped at 12/03/19 0600    NS flush syringe, 3 mL, Intracatheter, Q1H PRN, Thamas Jaegers, [place], NP    ondansetron (ZOFRAN) 2 mg/mL injection, 4 mg, Intravenous, Q6H PRN, Brent Bulla, NP    oxyCODONE (ROXICODONE) immediate release tablet, 5 mg, Oral, Q6H PRN, Brent Bulla, NP, 5 mg at 12/02/19 2136    pantoprazole (PROTONIX) delayed release tablet, 40 mg, Oral, Daily before Breakfast, [place], [place], NP, 40 mg at 12/03/19 0555    potassium  chloride (K-DUR) extended release tablet, 20 mEq, Oral, Daily, Thamas Jaegers, [place], NP, 20 mEq at 12/03/19 9381    rOPINIRole (REQUIP) tablet, 0.5 mg, Oral, NIGHTLY, Thamas Jaegers, [place], NP, 0.5 mg at 12/02/19 2135    sennosides-docusate sodium (SENOKOT-S) 8.6-50mg  per tablet, 1 Tablet, Oral, HS PRN, Thamas Jaegers, [place], NP    tiotropium bromide (SPIRIVA HANDIHALER) 18 mcg caps with inhalation device, 18 mcg, Inhalation, Daily, Olney Monier, MD, Stopped at 12/02/19 0800    Labs:       CBC:  Recent Labs     12/01/19  0609 12/02/19  0559 12/03/19  0543   WBC 6.3 8.5 7.0   HGB 11.7* 10.6* 10.4*   HCT 35.7 32.9* 32.5*   PLTCNT 297 272 261     Differential:   Recent Labs     12/01/19  0609 12/02/19  0559 12/03/19  0543   PMNS 87 75 72   LYMPHOCYTES 11 11 10    MONOCYTES 2 12 15    EOSINOPHIL 0 1 2   BASOPHILS 0   0.00 1   0.00 1   0.00   PMNABS 5.50 6.40 5.10   LYMPHSABS 0.70* 1.00 0.70*   MONOSABS 0.10* 1.00* 1.10*   EOSABS 0.00 0.10 0.10     BMP:  Recent Labs     12/01/19  0609 12/02/19  0600 12/03/19  0543   SODIUM 137 143 140   POTASSIUM 4.4 4.4 3.9   CHLORIDE 105 108 107   CO2 25 28 28    BUN 15 14 11    CREATININE 0.93 0.77 0.73   GLUCOSENF 234* 111* 108   ANIONGAP 7 7 5    GFR >60 >60 >60     Recent Labs     12/01/19  0609 12/02/19  0600 12/03/19  0543   CALCIUM 8.7* 8.6* 8.5*   MAGNESIUM 1.9 2.0 1.8   PHOSPHORUS 2.9 3.9 4.4     RBC Indices:  Recent Labs     12/01/19  0609 12/02/19  0559 12/03/19  0543   MCV 73.1* 73.3* 73.7*   MCH 24.0* 23.6* 23.6*   MCHC 32.9 32.1* 32.0*   RDW 18.6* 18.4* 18.4*   MPV 7.7 7.5 7.2     Coagulation Studies:  Recent Labs  11/30/19  1720 12/01/19  0609   INR 1.15* 1.44*   APTT 66.7*  --      Liver/Pancreas:  No results for input(s): TOTALPROTEIN, ALBUMIN, PREALBUMIN, AST, ALT, ALKPHOS, LDH, AMYLASE, LIPASE in the last 72 hours.    Invalid input(s): GGT  ABG's  No results found for this encounter  Cardiac Markers  Recent Labs     11/30/19  1720 12/01/19  0609   BNP 107* 106*       I have reviewed all  labs.      Microbiology:  Hospital Encounter on 11/30/19 (from the past 96 hour(s))   STREPTOCOCCUS PNEUMONIAE ANTIGEN,URINE    Collection Time: 12/01/19  6:25 PM    Specimen: Urine, Site not specified   Culture Result Status    S.PNEUMONIA ANTIGEN Negative Final   LEGIONELLA URINE ANTIGEN    Collection Time: 12/01/19  6:25 PM    Specimen: Urine, Site not specified   Culture Result Status    LEGIONELLA ANTIGEN Negative Final   RESPIRATORY CULTURE AND GRAM STAIN, AEROBIC    Collection Time: 12/02/19 12:20 AM    Specimen: Sputum    Narrative    The following orders were created for panel order RESPIRATORY CULTURE AND GRAM STAIN, AEROBIC.  Procedure                               Abnormality         Status                     ---------                               -----------         ------                     SPUTUM [id number]]                                    Final result                 Please view results for these tests on the individual orders.   RESPIRATORY CULTURE AND GRAM STAIN (PERFORMABLE)    Collection Time: 12/02/19 12:20 AM    Specimen: Sputum   Culture Result Status    GRAM STAIN 1+ Rare WBCs Preliminary    GRAM STAIN 1+ Rare Gram Positive Cocci/Clusters Preliminary       Antibiotics:  Antibiotics (last 24 hours)     Date/Time Action Medication Dose Rate    12/02/19 0848 Given    azithromycin (ZITHROMAX) tablet 500 mg     12/02/19 0601 New Bag/New Syringe    ampicillin-sulbactam (UNASYN) 3 g in NS 100 mL IVPB minibag 3 g 400 mL/hr    12/02/19 0054 New Bag/New Syringe    ampicillin-sulbactam (UNASYN) 3 g in NS 100 mL IVPB minibag 3 g 400 mL/hr    12/01/19 2057 New Bag/New Syringe    ampicillin-sulbactam (UNASYN) 3 g in NS 100 mL IVPB minibag 3 g 400 mL/hr    12/01/19 1340 New Bag/New Syringe    ampicillin-sulbactam (UNASYN) 3 g in NS 100 mL IVPB minibag 3 g 400 mL/hr          Assessment/ Plan:   Bilateral  pneumonia  Adenocarcinoma of the right lung  History of OSA  Dyspnea  History of  asthma  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 old female  who was admitted to the hospital for pneumonia  Currently on Unasyn and azithromycin.  Continue for 7 days.  I will start the patient on Symbicort  CRP trending down.  Continue Unasyn and azithromycin for total of 7 days.  From pulmonary standpoint patient can be discharged home.  Will Send the prescription for Incruse.  Continue Symbicort  Will need oxygen challenge test prior to discharge.  Continue apixaban for AFib    Bernadene Garside, MD4/15/2021        This note may have been partially generated using MModal Fluency Direct system, and there may be some incorrect words, spellings, and punctuation that were not noted in checking the note before saving.

## 2019-12-03 NOTE — Care Management Notes (Signed)
[facility]  Care Management Note    Patient Name: Kelsey [name]  Date of Birth: 01/24/1951  Sex: female  Date/Time of Admission: 11/30/2019  4:44 PM  Room/Bed: 4127/A  Payor: MEDICARE / Plan: [place] MEDICARE PART A AND B / Product Type: Medicare /    LOS: 3 days   PCP: [facility]    Admitting Diagnosis:  Pneumonia [J18.9]    Assessment:   Dr. Dorisann Frames provided scripts for pt. SW faxed to pt pharmacy. SW contacted [facility] and per staff, no prior auth required. Staff did advise that Mucinex is an over the counter medication. She added that they have a partial fill for the incruse ellipta inhalation disk and she added that this will be provided to pt. She added that pt will have no cost upon pick up of the partial. She advised that since they cannot fill the entire script, copay cannot be obtained for full fill but no Josem Kaufmann is needed.     The patient will continue to be evaluated for developing discharge needs.     Case Manager: Hassell Halim, [place]  Phone: [phone]

## 2019-12-03 NOTE — Care Plan (Signed)
Problem: Adult Inpatient Plan of Care  Goal: Plan of Care Review  Outcome: Ongoing (see interventions/notes)  Goal: Patient-Specific Goal (Individualized)  Outcome: Ongoing (see interventions/notes)  Goal: Absence of Hospital-Acquired Illness or Injury  Outcome: Ongoing (see interventions/notes)  Goal: Optimal Comfort and Wellbeing  Outcome: Ongoing (see interventions/notes)  Goal: Rounds/Family Conference  Outcome: Ongoing (see interventions/notes)     Problem: Breathing Pattern Ineffective  Goal: Effective Breathing Pattern  Outcome: Ongoing (see interventions/notes)     Problem: Fall Injury Risk  Goal: Absence of Fall and Fall-Related Injury  Outcome: Ongoing (see interventions/notes)     Problem: Fluid Imbalance (Pneumonia)  Goal: Fluid Balance  Outcome: Ongoing (see interventions/notes)     Problem: Infection (Pneumonia)  Goal: Resolution of Infection Signs and Symptoms  Outcome: Ongoing (see interventions/notes)     Problem: Respiratory Compromise (Pneumonia)  Goal: Effective Oxygenation and Ventilation  Outcome: Ongoing (see interventions/notes)     Problem: Asthma Comorbidity  Goal: Maintenance of Asthma Control  Outcome: Ongoing (see interventions/notes)     Problem: COPD Comorbidity  Goal: Maintenance of COPD Symptom Control  Outcome: Ongoing (see interventions/notes)     Problem: Hypertension Comorbidity  Goal: Blood Pressure in Desired Range  Outcome: Ongoing (see interventions/notes)     Problem: Obstructive Sleep Apnea Risk or Actual (Comorbidity Management)  Goal: Unobstructed Breathing During Sleep  Outcome: Ongoing (see interventions/notes)     Problem: Pain Chronic (Persistent) (Comorbidity Management)  Goal: Acceptable Pain Control and Functional Ability  Outcome: Ongoing (see interventions/notes)     Problem: Discharge Needs Assessment  Goal: Discharge Needs Assessment  Outcome: Ongoing (see interventions/notes)     Problem: Health Knowledge, Opportunity to Enhance  (Adult,Obstetrics,Pediatric)  Goal: Knowledgeable about Health Subject/Topic  Description: Patient will demonstrate the desired outcomes by discharge/transition of care.  Outcome: Ongoing (see interventions/notes)

## 2019-12-03 NOTE — Care Plan (Signed)
Pt remained stable and free from falls throughout shift.   IV antibiotics continued.  Pt denied SOB except with ambulation and exertion.  PRN pain medication given one time.  BP (!) 106/41    Pulse 88    Temp 36.8 C (98.2 F)    Resp 18    Ht 1.575 m (5\' 2" )    Wt 113 kg (248 lb 8 oz)    SpO2 92%    BMI 45.45 kg/m   I have reviewed this patient's orders and plan of care.  Currently this patient meets requirements for a mid to high level of nursing care.  The patient will continue to be monitored and re-evaluated for any changes.    Rich Brave, RN

## 2019-12-03 NOTE — Nurses Notes (Signed)
Pt. [place], deaccessed. Patient discharged home with family.  AVS reviewed with patient/care giver.  A written copy of the AVS and discharge instructions was given to the patient/care giver.  Questions sufficiently answered as needed.  Patient/care giver encouraged to follow up with PCP as indicated.  In the event of an emergency, patient/care giver instructed to call 911 or go to the nearest emergency room.     Harrel Lemon, RN

## 2019-12-03 NOTE — Care Management Notes (Signed)
Referral Information  ++++++ Placed Provider #1 ++++++  Case Manager: Paul McAllister  Provider Type: DME  Provider Name: [facility] - [place]  Address:  [address]  [place], [place] [address]  Contact:    Fax:   Fax:

## 2019-12-04 LAB — MYCOPLASMA PNEUMONIAE ANTIBODIES, IGG AND IGM, SERUM
M. pneumoniae Ab, IgG, S: POSITIVE — AB
M. pneumoniae Ab, IgM, S: NEGATIVE

## 2019-12-06 LAB — RESPIRATORY CULTURE AND GRAM STAIN (PERFORMABLE)

## 2019-12-07 ENCOUNTER — Telehealth (HOSPITAL_COMMUNITY): Payer: Self-pay

## 2019-12-07 NOTE — Telephone Encounter (Signed)
Called patient to follow up with her since being discharged from the hospital. She claims she is doing "as good as possible." She went on to say she has still been having some shortness of breath. She does tire easily and has been utilizing the wheelchair more often than she did previously. Her husband has been helping her around the house since she has not been feeling well. She claims she has been sleeping a lot and has still had a few low grade fevers. She claims she has been taking all of her medications, including the antibiotic she was prescribed. She claims her last dose will be this evening.   I questioned her about seeing her PCP. She claims she has not. She has also not tried contacting them. She claims she called the pulmonologist office, but they told her it would be at least one month before they could see her. I explained she needs to contact her PCP, explaining what has been going on - especially the continued fevers. I offered to call for her, but she stated she will call them. I explained she needs to make an appointment as soon as possible, because she may need another prescription for a different antibiotic. She has concerns about taking so many antibiotics lately, so I encouraged her to eat some yogurt and take a probiotic.   She claims she has no other questions or concerns at this time. I encouraged her to call me if any developed.   Particia Nearing, RN  Transition Coach    Called patient's PCP to inform them of her current situation. Patient had already been in contact with them and scheduled an appointment for tomorrow 12/08/19 at 4:40pm.  Particia Nearing, RN  Transition Coach

## 2019-12-08 LAB — ORGANISMS SUSCEPTIBILITY TESTING, OUTREACH CLIENTS ONLY

## 2019-12-10 ENCOUNTER — Encounter (HOSPITAL_COMMUNITY): Payer: Self-pay | Admitting: Internal Medicine

## 2019-12-10 ENCOUNTER — Inpatient Hospital Stay (HOSPITAL_COMMUNITY): Payer: Medicare Other

## 2019-12-10 ENCOUNTER — Encounter (HOSPITAL_COMMUNITY): Payer: Self-pay

## 2019-12-10 ENCOUNTER — Inpatient Hospital Stay
Admission: EM | Admit: 2019-12-10 | Discharge: 2019-12-14 | DRG: 178 | Disposition: A | Discharge status: Home / Self Care | Payer: Medicare Other | Attending: Internal Medicine | Admitting: Internal Medicine

## 2019-12-10 ENCOUNTER — Ambulatory Visit (HOSPITAL_COMMUNITY)
Admission: RE | Admit: 2019-12-10 | Discharge: 2019-12-10 | Disposition: A | Discharge status: Home / Self Care | Payer: Medicare Other | Source: Ambulatory Visit | Attending: Internal Medicine | Admitting: Internal Medicine

## 2019-12-10 ENCOUNTER — Inpatient Hospital Stay (HOSPITAL_COMMUNITY): Payer: Medicare Other | Admitting: Internal Medicine

## 2019-12-10 ENCOUNTER — Other Ambulatory Visit: Payer: Self-pay

## 2019-12-10 ENCOUNTER — Ambulatory Visit (HOSPITAL_COMMUNITY)
Admission: RE | Admit: 2019-12-10 | Discharge: 2019-12-10 | Disposition: A | Discharge status: Home / Self Care | Payer: Medicare Other | Source: Ambulatory Visit

## 2019-12-10 ENCOUNTER — Ambulatory Visit (HOSPITAL_BASED_OUTPATIENT_CLINIC_OR_DEPARTMENT_OTHER): Payer: Medicare Other | Admitting: Internal Medicine

## 2019-12-10 VITALS — BP 145/75 | HR 87 | Temp 97.2°F | Ht 62.0 in | Wt 247.6 lb

## 2019-12-10 DIAGNOSIS — C3490 Malignant neoplasm of unspecified part of unspecified bronchus or lung: Secondary | ICD-10-CM

## 2019-12-10 DIAGNOSIS — J449 Chronic obstructive pulmonary disease, unspecified: Secondary | ICD-10-CM

## 2019-12-10 DIAGNOSIS — Z9981 Dependence on supplemental oxygen: Secondary | ICD-10-CM

## 2019-12-10 DIAGNOSIS — Z79890 Hormone replacement therapy (postmenopausal): Secondary | ICD-10-CM

## 2019-12-10 DIAGNOSIS — R0602 Shortness of breath: Secondary | ICD-10-CM

## 2019-12-10 DIAGNOSIS — D630 Anemia in neoplastic disease: Secondary | ICD-10-CM | POA: Diagnosis present

## 2019-12-10 DIAGNOSIS — I4891 Unspecified atrial fibrillation: Secondary | ICD-10-CM

## 2019-12-10 DIAGNOSIS — J440 Chronic obstructive pulmonary disease with acute lower respiratory infection: Secondary | ICD-10-CM | POA: Diagnosis present

## 2019-12-10 DIAGNOSIS — R7989 Other specified abnormal findings of blood chemistry: Secondary | ICD-10-CM

## 2019-12-10 DIAGNOSIS — G4733 Obstructive sleep apnea (adult) (pediatric): Secondary | ICD-10-CM | POA: Diagnosis present

## 2019-12-10 DIAGNOSIS — J189 Pneumonia, unspecified organism: Secondary | ICD-10-CM | POA: Diagnosis present

## 2019-12-10 DIAGNOSIS — Z87891 Personal history of nicotine dependence: Secondary | ICD-10-CM

## 2019-12-10 DIAGNOSIS — E785 Hyperlipidemia, unspecified: Secondary | ICD-10-CM | POA: Diagnosis present

## 2019-12-10 DIAGNOSIS — R946 Abnormal results of thyroid function studies: Secondary | ICD-10-CM

## 2019-12-10 DIAGNOSIS — E039 Hypothyroidism, unspecified: Secondary | ICD-10-CM | POA: Diagnosis present

## 2019-12-10 DIAGNOSIS — Z79899 Other long term (current) drug therapy: Secondary | ICD-10-CM

## 2019-12-10 DIAGNOSIS — Z7901 Long term (current) use of anticoagulants: Secondary | ICD-10-CM

## 2019-12-10 DIAGNOSIS — Z85038 Personal history of other malignant neoplasm of large intestine: Secondary | ICD-10-CM

## 2019-12-10 DIAGNOSIS — Z20822 Contact with and (suspected) exposure to covid-19: Secondary | ICD-10-CM | POA: Diagnosis present

## 2019-12-10 DIAGNOSIS — I480 Paroxysmal atrial fibrillation: Secondary | ICD-10-CM | POA: Diagnosis present

## 2019-12-10 DIAGNOSIS — I10 Essential (primary) hypertension: Secondary | ICD-10-CM | POA: Diagnosis present

## 2019-12-10 DIAGNOSIS — Z7951 Long term (current) use of inhaled steroids: Secondary | ICD-10-CM

## 2019-12-10 DIAGNOSIS — J156 Pneumonia due to other aerobic Gram-negative bacteria: Principal | ICD-10-CM | POA: Diagnosis present

## 2019-12-10 LAB — CBC WITH DIFF
BASOPHIL #: 0.1 10*3/uL (ref 0.00–0.20)
BASOPHIL #: 0 10*3/uL (ref 0.00–0.20)
BASOPHIL %: 1 %
BASOPHIL %: 1 %
EOSINOPHIL #: 0.2 10*3/uL (ref 0.00–0.50)
EOSINOPHIL #: 0.2 10*3/uL (ref 0.00–0.50)
EOSINOPHIL %: 2 %
EOSINOPHIL %: 2 %
HCT: 33.1 % — ABNORMAL LOW (ref 34.6–46.2)
HCT: 34.1 % — ABNORMAL LOW (ref 34.6–46.2)
HGB: 10.7 g/dL — ABNORMAL LOW (ref 11.8–15.8)
HGB: 11 g/dL — ABNORMAL LOW (ref 11.8–15.8)
LYMPHOCYTE #: 0.8 10*3/uL — ABNORMAL LOW (ref 0.90–3.40)
LYMPHOCYTE #: 0.9 10*3/uL (ref 0.90–3.40)
LYMPHOCYTE %: 10 %
LYMPHOCYTE %: 10 %
MCH: 23.5 pg — ABNORMAL LOW (ref 27.6–33.2)
MCH: 23.4 pg — ABNORMAL LOW (ref 27.6–33.2)
MCHC: 32.3 g/dL — ABNORMAL LOW (ref 32.6–35.4)
MCHC: 32.1 g/dL — ABNORMAL LOW (ref 32.6–35.4)
MCV: 72.8 fL — ABNORMAL LOW (ref 82.3–96.7)
MCV: 72.7 fL — ABNORMAL LOW (ref 82.3–96.7)
MONOCYTE #: 1 10*3/uL — ABNORMAL HIGH (ref 0.20–0.90)
MONOCYTE #: 0.9 10*3/uL (ref 0.20–0.90)
MONOCYTE %: 12 %
MONOCYTE %: 10 %
MPV: 7.3 fL (ref 6.6–10.2)
MPV: 7.2 fL (ref 6.6–10.2)
NEUTROPHIL #: 6.3 10*3/uL (ref 1.50–6.40)
NEUTROPHIL #: 6.9 10*3/uL — ABNORMAL HIGH (ref 1.50–6.40)
NEUTROPHIL %: 76 %
NEUTROPHIL %: 77 %
PLATELETS: 351 10*3/uL (ref 140–440)
PLATELETS: 344 10*3/uL (ref 140–440)
RBC: 4.55 10*6/uL (ref 3.80–5.24)
RBC: 4.69 10*6/uL (ref 3.80–5.24)
RDW: 18.9 % — ABNORMAL HIGH (ref 12.4–15.2)
RDW: 19 % — ABNORMAL HIGH (ref 12.4–15.2)
WBC: 8.3 10*3/uL (ref 3.5–10.3)
WBC: 9 10*3/uL (ref 3.5–10.3)

## 2019-12-10 LAB — BASIC METABOLIC PANEL
ANION GAP: 9 mmol/L
BUN/CREA RATIO: 14
BUN: 11 mg/dL (ref 10–25)
CALCIUM: 8.7 mg/dL — ABNORMAL LOW (ref 8.8–10.3)
CHLORIDE: 105 mmol/L (ref 98–111)
CO2 TOTAL: 24 mmol/L (ref 21–35)
CREATININE: 0.78 mg/dL (ref <=1.30)
ESTIMATED GFR: >60 mL/min/{1.73_m2}
GLUCOSE: 100 mg/dL (ref 70–110)
POTASSIUM: 4.2 mmol/L (ref 3.5–5.0)
SODIUM: 138 mmol/L (ref 135–145)

## 2019-12-10 LAB — COVID-19, FLU A/B, RSV RAPID BY PCR
INFLUENZA VIRUS TYPE A: NOT DETECTED
INFLUENZA VIRUS TYPE B: NOT DETECTED
RESPIRATORY SYNCTIAL VIRUS (RSV): NOT DETECTED
SARS-CoV-2: NOT DETECTED

## 2019-12-10 LAB — COMPREHENSIVE METABOLIC PANEL, NON-FASTING
ALBUMIN: 3.2 g/dL (ref 3.2–4.6)
ALKALINE PHOSPHATASE: 90 U/L (ref 20–130)
ALT (SGPT): 12 U/L (ref <=52)
ANION GAP: 7 mmol/L
AST (SGOT): 16 U/L (ref <=35)
BILIRUBIN TOTAL: 0.6 mg/dL (ref 0.3–1.2)
BUN/CREA RATIO: 14
BUN: 12 mg/dL (ref 10–25)
CALCIUM: 8.4 mg/dL — ABNORMAL LOW (ref 8.8–10.3)
CHLORIDE: 106 mmol/L (ref 98–111)
CO2 TOTAL: 24 mmol/L (ref 21–35)
CREATININE: 0.87 mg/dL (ref <=1.30)
ESTIMATED GFR: >60 mL/min/{1.73_m2}
GLUCOSE: 104 mg/dL (ref 70–110)
POTASSIUM: 3.9 mmol/L (ref 3.5–5.0)
PROTEIN TOTAL: 6 g/dL (ref 6.0–8.3)
SODIUM: 137 mmol/L (ref 135–145)

## 2019-12-10 LAB — ECG 12 LEAD - ED USE
Calculated P Axis: 49 deg
Calculated T Axis: 75 deg
EKG Severity: NORMAL
Heart Rate: 87 {beats}/min
I 40 Axis: 33 deg
PR Interval: 142 ms
QRS Axis: 49 deg
QRS Duration: 85 ms
QT Interval: 398 ms
QTC Calculation: 479 ms
ST Axis: 93 deg
T 40 Axis: 66 deg

## 2019-12-10 LAB — THYROID STIMULATING HORMONE (SENSITIVE TSH): TSH: 3.532 u[IU]/mL (ref 0.450–5.330)

## 2019-12-10 LAB — VENOUS BLOOD GAS
%FIO2 (VENOUS): 21 %
BASE DEFICIT: 1.5 mmol/L (ref ?–2.0)
BICARBONATE (VENOUS): 22.8 mmol/L — ABNORMAL LOW (ref 23.0–33.0)
O2 SATURATION (VENOUS): 58.5 % — ABNORMAL LOW (ref 95.0–98.0)
PCO2 (VENOUS): 41 mmHg (ref 38.00–50.00)
PH (VENOUS): 7.37 (ref 7.32–7.45)
PO2 (VENOUS): 34 mmHg (ref 30.0–50.0)

## 2019-12-10 LAB — LACTIC ACID LEVEL: LACTIC ACID: 0.9 mmol/L (ref <2.0)

## 2019-12-10 LAB — TROPONIN-I: TROPONIN I: <0.03 ng/mL (ref <=0.04)

## 2019-12-10 MED ORDER — APIXABAN 5 MG TABLET
5.00 mg | ORAL_TABLET | Freq: Two times a day (BID) | ORAL | Status: DC
Start: 2019-12-10 — End: 2019-12-14
  Administered 2019-12-10 – 2019-12-14 (×8): 5 mg via ORAL
  Filled 2019-12-10 (×11): qty 1

## 2019-12-10 MED ORDER — ACETAMINOPHEN 325 MG TABLET
650.00 mg | ORAL_TABLET | Freq: Four times a day (QID) | ORAL | Status: DC | PRN
Start: 2019-12-10 — End: 2019-12-14

## 2019-12-10 MED ORDER — AMPICILLIN-SULBACTAM 3G IN NS 100ML MINIBAG IVPB
3.00 g | INTRAMUSCULAR | Status: AC
Start: 2019-12-10 — End: 2019-12-10
  Administered 2019-12-10: 0 g via INTRAVENOUS
  Administered 2019-12-10: 11:00:00 3 g via INTRAVENOUS
  Filled 2019-12-10: qty 100

## 2019-12-10 MED ORDER — BUDESONIDE-FORMOTEROL HFA 160 MCG-4.5 MCG/ACTUATION - EAST
2.00 | INHALATION_SPRAY | Freq: Two times a day (BID) | RESPIRATORY_TRACT | Status: DC
Start: 2019-12-10 — End: 2019-12-14
  Administered 2019-12-10 – 2019-12-11 (×2): 2 via RESPIRATORY_TRACT
  Administered 2019-12-11: 20:00:00 0 via RESPIRATORY_TRACT
  Administered 2019-12-12 – 2019-12-14 (×5): 2 via RESPIRATORY_TRACT
  Filled 2019-12-10 (×2): qty 6

## 2019-12-10 MED ORDER — SODIUM CHLORIDE 0.9 % (FLUSH) INJECTION SYRINGE
3.00 mL | INJECTION | INTRAMUSCULAR | Status: DC | PRN
Start: 2019-12-10 — End: 2019-12-14

## 2019-12-10 MED ORDER — ONDANSETRON HCL (PF) 4 MG/2 ML INJECTION SOLUTION
4.00 mg | Freq: Three times a day (TID) | INTRAMUSCULAR | Status: DC | PRN
Start: 2019-12-10 — End: 2019-12-14
  Administered 2019-12-10: 4 mg via INTRAVENOUS
  Filled 2019-12-10: qty 2

## 2019-12-10 MED ORDER — SODIUM CHLORIDE 0.9 % (FLUSH) INJECTION SYRINGE
3.00 mL | INJECTION | Freq: Three times a day (TID) | INTRAMUSCULAR | Status: DC
Start: 2019-12-10 — End: 2019-12-14
  Administered 2019-12-10 (×2): 0 mL
  Administered 2019-12-11: 3 mL
  Administered 2019-12-11: 22:00:00 10 mL
  Administered 2019-12-11: 06:00:00 0 mL
  Administered 2019-12-12: 22:00:00 10 mL
  Administered 2019-12-12: 06:00:00 20 mL
  Administered 2019-12-12 – 2019-12-13 (×2): 0 mL
  Administered 2019-12-13: 14:00:00 3 mL
  Administered 2019-12-13 – 2019-12-14 (×3): 0 mL

## 2019-12-10 MED ORDER — ROPINIROLE 0.5 MG TABLET
0.50 mg | ORAL_TABLET | Freq: Every evening | ORAL | Status: DC
Start: 2019-12-10 — End: 2019-12-14
  Administered 2019-12-10 – 2019-12-13 (×4): 0.5 mg via ORAL
  Filled 2019-12-10 (×6): qty 1

## 2019-12-10 MED ORDER — AMPICILLIN-SULBACTAM 3G IN NS 100ML MINIBAG IVPB
3.00 g | Freq: Four times a day (QID) | INTRAMUSCULAR | Status: DC
Start: 2019-12-10 — End: 2019-12-14
  Administered 2019-12-10: 17:00:00 3 g via INTRAVENOUS
  Administered 2019-12-10: 0 g via INTRAVENOUS
  Administered 2019-12-10: 3 g via INTRAVENOUS
  Administered 2019-12-10: 23:00:00 0 g via INTRAVENOUS
  Administered 2019-12-11: 12:00:00 3 g via INTRAVENOUS
  Administered 2019-12-11: 13:00:00 0 g via INTRAVENOUS
  Administered 2019-12-11: 07:00:00 3 g via INTRAVENOUS
  Administered 2019-12-11 (×2): 0 g via INTRAVENOUS
  Administered 2019-12-11 – 2019-12-12 (×3): 3 g via INTRAVENOUS
  Administered 2019-12-12 (×4): 0 g via INTRAVENOUS
  Administered 2019-12-12 (×2): 3 g via INTRAVENOUS
  Administered 2019-12-12: 01:00:00 0 g via INTRAVENOUS
  Administered 2019-12-12 – 2019-12-13 (×2): 3 g via INTRAVENOUS
  Administered 2019-12-13 (×3): 0 g via INTRAVENOUS
  Administered 2019-12-13 (×3): 3 g via INTRAVENOUS
  Administered 2019-12-13 – 2019-12-14 (×4): 0 g via INTRAVENOUS
  Administered 2019-12-14 (×2): 3 g via INTRAVENOUS
  Filled 2019-12-10 (×21): qty 100

## 2019-12-10 MED ORDER — DOXYCYCLINE HYCLATE 100 MG CAPSULE
100.00 mg | ORAL_CAPSULE | Freq: Two times a day (BID) | ORAL | Status: DC
Start: 2019-12-10 — End: 2019-12-14
  Administered 2019-12-10 – 2019-12-14 (×9): 100 mg via ORAL
  Filled 2019-12-10 (×12): qty 1

## 2019-12-10 MED ORDER — ATORVASTATIN 10 MG TABLET
10.00 mg | ORAL_TABLET | Freq: Every day | ORAL | Status: DC
Start: 2019-12-10 — End: 2019-12-14
  Administered 2019-12-10 – 2019-12-14 (×5): 10 mg via ORAL
  Filled 2019-12-10 (×6): qty 1

## 2019-12-10 MED ORDER — IPRATROPIUM 0.5 MG-ALBUTEROL 3 MG (2.5 MG BASE)/3 ML NEBULIZATION SOLN
3.00 mL | INHALATION_SOLUTION | Freq: Four times a day (QID) | RESPIRATORY_TRACT | Status: DC | PRN
Start: 2019-12-10 — End: 2019-12-14
  Administered 2019-12-10: 17:00:00 3 mL via RESPIRATORY_TRACT
  Filled 2019-12-10: qty 3

## 2019-12-10 MED ORDER — CARVEDILOL 3.125 MG TABLET
3.1250 mg | ORAL_TABLET | Freq: Two times a day (BID) | ORAL | Status: DC
Start: 2019-12-10 — End: 2019-12-14
  Administered 2019-12-10 – 2019-12-14 (×9): 3.125 mg via ORAL
  Filled 2019-12-10 (×11): qty 1

## 2019-12-10 MED ORDER — DOCUSATE SODIUM 100 MG CAPSULE
100.00 mg | ORAL_CAPSULE | Freq: Three times a day (TID) | ORAL | Status: DC | PRN
Start: 2019-12-10 — End: 2019-12-14

## 2019-12-10 MED ORDER — LISINOPRIL 20 MG TABLET
20.00 mg | ORAL_TABLET | Freq: Every day | ORAL | Status: DC
Start: 2019-12-10 — End: 2019-12-14
  Administered 2019-12-10 – 2019-12-14 (×5): 20 mg via ORAL
  Filled 2019-12-10 (×6): qty 1

## 2019-12-10 MED ORDER — PROCHLORPERAZINE MALEATE 10 MG TABLET
10.00 mg | ORAL_TABLET | Freq: Four times a day (QID) | ORAL | Status: DC | PRN
Start: 2019-12-10 — End: 2019-12-14
  Administered 2019-12-11: 10 mg via ORAL
  Filled 2019-12-10: qty 1

## 2019-12-10 MED ORDER — PANTOPRAZOLE 40 MG TABLET,DELAYED RELEASE
40.00 mg | DELAYED_RELEASE_TABLET | Freq: Every morning | ORAL | Status: DC
Start: 2019-12-11 — End: 2019-12-14
  Administered 2019-12-11 – 2019-12-14 (×4): 40 mg via ORAL
  Filled 2019-12-10 (×6): qty 1

## 2019-12-10 MED ORDER — LORATADINE 10 MG TABLET
10.00 mg | ORAL_TABLET | Freq: Every day | ORAL | Status: DC
Start: 2019-12-10 — End: 2019-12-14
  Administered 2019-12-10 – 2019-12-14 (×5): 10 mg via ORAL
  Filled 2019-12-10 (×6): qty 1

## 2019-12-10 MED ORDER — GUAIFENESIN ER 600 MG TABLET, EXTENDED RELEASE 12 HR
1200.00 mg | EXTENDED_RELEASE_TABLET | Freq: Two times a day (BID) | ORAL | Status: DC
Start: 2019-12-10 — End: 2019-12-14
  Administered 2019-12-10 – 2019-12-14 (×8): 1200 mg via ORAL
  Filled 2019-12-10 (×11): qty 2

## 2019-12-10 MED ORDER — OXYCODONE 5 MG TABLET
5.00 mg | ORAL_TABLET | Freq: Four times a day (QID) | ORAL | Status: DC | PRN
Start: 2019-12-10 — End: 2019-12-14
  Administered 2019-12-10 – 2019-12-13 (×3): 5 mg via ORAL
  Filled 2019-12-10 (×3): qty 1

## 2019-12-10 MED ORDER — FLECAINIDE 100 MG TABLET
100.00 mg | ORAL_TABLET | Freq: Two times a day (BID) | ORAL | Status: DC
Start: 2019-12-10 — End: 2019-12-14
  Administered 2019-12-10 – 2019-12-14 (×8): 100 mg via ORAL
  Filled 2019-12-10 (×13): qty 1

## 2019-12-10 MED ORDER — LINACLOTIDE 72 MCG CAPSULE
72.00 ug | ORAL_CAPSULE | Freq: Every morning | ORAL | Status: DC
Start: 2019-12-10 — End: 2019-12-14
  Administered 2019-12-10 – 2019-12-14 (×5): 72 ug via ORAL
  Filled 2019-12-10 (×7): qty 1

## 2019-12-10 MED ORDER — LEVOTHYROXINE 112 MCG TABLET
112.00 ug | ORAL_TABLET | Freq: Every morning | ORAL | Status: DC
Start: 2019-12-10 — End: 2019-12-14
  Administered 2019-12-10 – 2019-12-14 (×5): 112 ug via ORAL
  Filled 2019-12-10 (×7): qty 1

## 2019-12-10 MED ORDER — MAGNESIUM OXIDE 400 MG (241.3 MG MAGNESIUM) TABLET
400.00 mg | ORAL_TABLET | Freq: Every day | ORAL | Status: DC
Start: 2019-12-10 — End: 2019-12-14
  Administered 2019-12-10 – 2019-12-14 (×5): 400 mg via ORAL
  Filled 2019-12-10 (×6): qty 1

## 2019-12-10 MED ORDER — TIOTROPIUM BROMIDE 18 MCG CAPS WITH INHALATION DEVICE - EAST
18.00 ug | ORAL_CAPSULE | Freq: Every day | RESPIRATORY_TRACT | Status: DC
Start: 2019-12-10 — End: 2019-12-14
  Administered 2019-12-10: 0 ug via RESPIRATORY_TRACT
  Administered 2019-12-11 – 2019-12-14 (×4): 18 ug via RESPIRATORY_TRACT
  Filled 2019-12-10 (×6): qty 1

## 2019-12-10 NOTE — Progress Notes (Signed)
[place]  [place] [id number]        Encounter Date: 12/10/2019   8:30 AM EDT    Name:  Kelsey [name]  Age: 69 y.o.  DOB: June 29, 1951  Sex: female  PCP: [facility]rey    Chief Complaint:    Chief Complaint   Patient presents with    Lung Cancer     HISTORY OF PRESENT ILLNESS:   69 y.o. female with history of stage III colon cancer of present evaluation and management of lung cancer.    1. September 2015. Patient underwent sigmoidoscopy and was found to have polyp which was adenocarcinoma.    2. June 10, 2014. She was admitted for segmental resection of sigmoid colon. This revealed pT3, N1a, MX, low-grade  adenocarcinoma of the colon. The mass measuring 4 x 3 x 0.5 centimeters. It  was low-grade and there was no evidence of microsatellite instability by  histology. Margins were uninvolved and 4 lymph nodes only were removed which  only 1 was involved. This corresponding stage IIIB, T3, N1a, M0, low-grade  sigmoid cancer.     3. July 07, 2014. Recommended adjuvant chemotherapy with FOLFOX.    4. July 27, 2014. Patient was started on adjuvant chemotherapy with FOLFOX.  Patient received only 5 treatments and chemotherapy was stopped due to poor tolerance.  After that patient lost follow-up.      5. October 2019. Patient was admitted to hospital with concern for  bowel obstruction.  CT chest showed lung nodules.  Patient was managed conservatively and discharged home.    6. May 06, 2018.  Patient underwent CT-guided biopsy of lung lesion.  Pathology showed well-differentiated adenocarcinoma.  Section shows well-differentiated adenocarcinoma which positive for CK 7 and TTF 1, negative for P 63 and CK 20.     7. June 23, 2018. PET scan showed Hypermetabolic right lower lobe pulmonary mass compatible with malignancy.  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 Hepatomegaly and  steatosis.     8. July 23, 2018. Patient was started on chemoradiation.    9. October 30, 2018. PET scan showed Hypermetabolic right lower lobe mass with abnormal mediastinal and hilar nodes. There has been only mild improvement since earlier exam.     10. October 31, 2018. Patient was started on maintenance immunotherapy with durvalumab.    11. February 17, 2019. CT chest revealed Moderate right effusion new from 12/19/2018. No change in the right lower lung mass or a presumed postradiation changes in the right upper lung when compared to 12/19/2018. Mass in the right cardiophrenic angle increased from 10/30/2019 6). Consider an enlarged lymph node here. This is unchanged from 12/19/2018 but increased since size from 10/30/2018. Another possibility is an enlarging pericardial cyst (this region was not hypermetabolic 3/81/8299). Mild enlargement of the left adrenal gland stable from 10/30/2018. Approximate 9 cm upper abdominal ventral hernia containing nonobstructed bowel loops unchanged    12. March 06, 2019. PET CT revealed 1. The RIGHT lower lobe mass has decreased in size and uptake, which is consistent with response to therapy. However, abnormal uptake remains. This is consistent with residual tumor. Suspect post radiation change in the RIGHT pneumothorax. Consolidation in the  medial RIGHT lung has tumor level uptake. Differential: Post radiation change, infection, and tumor infiltration. Additional findings are concerning for tumor infiltration. Follow-up recommended. Increased RIGHT hilar density and uptake. Concerning for tumor spread. Post radiation change less likely. 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 A low-attenuation mass in the RIGHT cardiophrenic angle may be related. Lymph node metastasis less likely.    13. October 29, 2019. CT chest revealed 1. Persistent dense consolidation in the RIGHT middle lobe. Compatible with post radiation change. Residual tumor cannot be excluded. 2. Scattered groundglass opacities  in both lungs. Favor post radiation change or infiltrate or tumor. Follow-up recommended. 3. A 5 mm nodule in the LEFT lingula is concerning for metastatic disease. Follow-up is recommended. PET/CT could be useful. Alternatively, tissue biopsy.      SUBJECTIVE:  Patient presents for follow-up and treatment.  Patient was recently admitted to hospital with pneumonia.  As per patient and family patient has been having fever, shortness of breath and productive cough.  As per husband patient had temperature of 101 F at home.  Overall she is feeling weak and fatigued.    REVIEW OF SYSTEMS:  GENERAL:   Reports of fevers and chills.  reports fatigue   HEENT: no sore throat, congestion, blurry vision  Lungs:  Denies any worsening shortness of breath.  Complains of cough  GI: no nausea, vomiting, constipation, no diarrhea  GU: No dysuria, urgency, increased frequency   Cardiac:  Denies any chest pain, palpitations, syncope,   Neuro: no weakness, loss of function, numbness, tingling  Skin: no rash  Musculoskeletal: No myalgias  Psychosocial: No depression, anxiety  Hematologic: No easy bruising, abnormal bleeding  All other review ROS negative except those mentioned above.     PATIENT HISTORY:  Past Medical History:   Diagnosis Date    A-fib (CMS HCC)     Arthropathy, unspecified, site unspecified     Asthma     Cancer (CMS [place])     cervical    Cataract     Colon cancer (CMS [place]) 07/06/2014    COPD (chronic obstructive pulmonary disease) (CMS HCC)     Fistula     HTN (hypertension)     Hyperlipidemia     Lung mass     with pleural effusion    Obesity     Sleep apnea     Ventral hernia        Past Surgical History:   Procedure Laterality Date    ABSCESS DRAINAGE      BOWEL RESECTION      CATARACT EXTRACTION      HX CERVICAL CONE BIOPSY       HX CESAREAN SECTION      HX GASTRIC BYPASS      25 years ago    [place] (x2)    HX HYSTERECTOMY      late 53s    HX LAP BANDING      2013      HX LAPAROTOMY      2013 to remove lap band       Family Medical History:     Problem Relation (Age of Onset)    Diabetes Mother, Maternal Grandmother, Maternal Grandfather, Father, Sister    Heart Attack Mother    Stroke Father          Current Outpatient Medications   Medication Sig    albuterol sulfate (PROVENTIL OR VENTOLIN OR PROAIR) 90 mcg/actuation Inhalation  HFA Aerosol Inhaler INHALE 1 TO 2 PUFFS BY MOUTH EVERY 6 HOURS AS NEEDED    albuterol sulfate (PROVENTIL) 2.5 mg /3 mL (0.083 %) Inhalation Solution for Nebulization 3 mL (2.5 mg total) by Nebulization route Every 6 hours    apixaban (ELIQUIS) 5 mg Oral Tablet Take 1 Tab (5 mg total) by mouth Twice daily    atorvastatin (LIPITOR) 10 mg Oral Tablet Take 10 mg by mouth Once a day    BREZTRI AEROSPHERE 160-9-4.8 mcg/actuation Inhalation HFA Aerosol Inhaler INHALE 2 PUFFS BY MOUTH TWICE DAILY    budesonide-formoteroL (SYMBICORT) 160-4.5 mcg/actuation Inhalation HFA Aerosol Inhaler Take 2 Puffs by inhalation Twice daily    carvediloL (COREG) 3.125 mg Oral Tablet Take 3.125 mg by mouth Twice daily with food    docusate sodium (COLACE) 100 mg Oral Capsule Take 100 mg by mouth Three times a day as needed     flecainide (TAMBOCOR) 100 mg Oral Tablet Take 1 Tab (100 mg total) by mouth Twice daily    guaiFENesin (MUCINEX) 600 mg Oral Tablet Extended Release 12hr Take 2 Tablets (1,200 mg total) by mouth Twice daily    levothyroxine (SYNTHROID) 112 mcg Oral Tablet Take 1 Tablet (112 mcg total) by mouth Every morning    linaCLOtide (LINZESS) 72 mcg Oral Capsule Take 72 mcg by mouth Every morning    lisinopriL (PRINIVIL) 20 mg Oral Tablet Take 20 mg by mouth Once a day    loperamide (IMODIUM) 2 mg Oral Capsule Take 2 mg by mouth Every 4 hours as needed    loratadine (CLARITIN) 10 mg Oral Tablet TAKE 1 TABLET BY MOUTH EVERY DAY    magnesium oxide (MAG-OX) 400 mg Oral Tablet Take 400 mg by mouth Once a day    omeprazole (PRILOSEC) 20 mg Oral Capsule,  Delayed Release(E.C.) Take 40 mg by mouth Once a day     oxyCODONE (ROXICODONE) 5 mg Oral Tablet Take 1 Tablet (5 mg total) by mouth Every 6 hours as needed for Pain    potassium chloride (K-DUR) 20 mEq Oral Tab Sust.Rel. Particle/Crystal Take 20 mEq by mouth Once a day Takes 3 days a week    prochlorperazine (COMPAZINE) 10 mg Oral Tablet Take 1 Tab (10 mg total) by mouth Four times a day as needed for Nausea/Vomiting    rOPINIRole (REQUIP) 0.5 mg Oral Tablet Take 0.5 mg by mouth Every night    tiotropium bromide (SPIRIVA HANDIHALER) 18 mcg Inhalation Capsule, w/Inhalation Device Take 1 Capsule (18 mcg total) by inhalation Once a day for 30 days    umeclidinium (INCRUSE ELLIPTA) 62.5 mcg/actuation Inhalation Disk with Device Take 1 INHALATION by inhalation Once a day     Social History     Socioeconomic History    Marital status: Married     Spouse name: Not on file    Number of children: Not on file    Years of education: Not on file    Highest education level: Not on file   Tobacco Use    Smoking status: Former Smoker    Smokeless tobacco: Never Used    Tobacco comment: quit 10 yrs ago   Vaping Use    Vaping Use: Never used   Substance and Sexual Activity    Alcohol use: Not Currently     Comment: rarely    Drug use: No    Sexual activity: Not Currently   Other Topics Concern    Ability to Walk 1 Flight of Steps without SOB/CP No  Ability To Do Own ADL's Yes     Social Determinants of Health     Financial Resource Strain:     Difficulty of Paying Living Expenses:    Food Insecurity:     Worried About Charity fundraiser in the Last Year:     Arboriculturist in the Last Year:    Transportation Needs:     Film/video editor (Medical):     Lack of Transportation (Non-Medical):    Physical Activity:     Days of Exercise per Week:     Minutes of Exercise per Session:    Stress:     Feeling of Stress :    Intimate Partner Violence:     Fear of Current or Ex-Partner:     Emotionally  Abused:     Physically Abused:     Sexually Abused:        PHYSICAL EXAMINATION:  General Vitals: BP (!) 145/75    Pulse 87    Temp 36.2 C (97.2 F)    Ht 1.575 m (5' 2" )    Wt 112 kg (247 lb 9.6 oz)    SpO2 97%    BMI 45.29 kg/m       PHYSICAL EXAM:   Consitutional: Appears fatigue  Eyes: EOMI. No discharge. No Jaundice.   ENT: mucous membranes dry. No posterior pharynx lesions.. Neck supple, No palpable masses   Heme/ Lymph: No Cervical, Inguinal, axillary lymh nodes. No Bruising.   Cardiovascular: S1, S2, No murmurs, rubs, or gallops.  Respiratory: RLL diminished sounds and otherwise coarse lung sounds noted.   Abdomen: Normal Bowel Sounds, nontender nondistended. No hepatosplenomegaly.   Musculoskeletal: No Edema to the extremities.   Skin: Normal turgor. No Rashes,skin lesions   Psychiatry: Normal Affect   Neuro: No focal deficits. Alert and Oriented x 3      ENCOUNTER ORDERS:  Orders Placed This Encounter    CT CHEST WO IV CONTRAST       LABORATORY/RADIOLOGICAL DATA: All pertinent labs/radiology were reviewed.   CBC  Diff   Lab Results   Component Value Date/Time    WBC 8.3 12/10/2019 08:12 AM    HGB 10.7 (L) 12/10/2019 08:12 AM    HCT 33.1 (L) 12/10/2019 08:12 AM    PLTCNT 351 12/10/2019 08:12 AM    RBC 4.55 12/10/2019 08:12 AM    MCV 72.8 (L) 12/10/2019 08:12 AM    MCHC 32.3 (L) 12/10/2019 08:12 AM    MCH 23.5 (L) 12/10/2019 08:12 AM    RDW 18.9 (H) 12/10/2019 08:12 AM    MPV 7.3 12/10/2019 08:12 AM    Lab Results   Component Value Date/Time    PMNS 76 12/10/2019 08:12 AM    LYMPHOCYTES 10 12/10/2019 08:12 AM    EOSINOPHIL 2 12/10/2019 08:12 AM    MONOCYTES 12 12/10/2019 08:12 AM    BASOPHILS 1 12/10/2019 08:12 AM    BASOPHILS 0.10 12/10/2019 08:12 AM    PMNABS 6.30 12/10/2019 08:12 AM    LYMPHSABS 0.80 (L) 12/10/2019 08:12 AM    EOSABS 0.20 12/10/2019 08:12 AM    MONOSABS 1.00 (H) 12/10/2019 08:12 AM    BASOSABS 0.10 11/10/2014 02:28 PM    BASABS 0.01 09/17/2018 08:24 AM        COMPREHENSIVE METABOLIC  PANEL  Lab Results   Component Value Date    SODIUM 137 12/10/2019    POTASSIUM 3.9 12/10/2019    CHLORIDE 106 12/10/2019    CO2  24 12/10/2019    ANIONGAP 7 12/10/2019    BUN 12 12/10/2019    CREATININE 0.87 12/10/2019    GLUCOSE Negative 09/24/2018    CALCIUM 8.4 (L) 12/10/2019    PHOSPHORUS 4.4 12/03/2019    ALBUMIN 3.2 12/10/2019    TOTALPROTEIN 6.0 12/10/2019    ALKPHOS 90 12/10/2019    AST 16 12/10/2019    ALT 12 12/10/2019    BILIRUBINCON 0.2 03/22/2018     THYROID STIMULATING HORMONE  Lab Results   Component Value Date    TSH 3.532 12/10/2019         ASSESSMENT AND PLAN:  69 y.o. female with history of stage III colon cancer status post 5 cycle of FOLFOX now presenting with stage III lung cancer.      ICD-10-CM    1. SOB (shortness of breath)  R06.02 CT CHEST WO IV CONTRAST   2. Malignant neoplasm of lung, unspecified laterality, unspecified part of lung (CMS HCC)  C34.90        1. Adenocarcinoma lung:  Stage III.    CT brain to rule out metastatic disease.  NEGATIVE.   Started on chemoradiation carboplatin/Taxol along with radiation- July 23, 2018.  After completion of chemo radiation patient will get PET scan.  If continued to have good response will start maintenance immunotherapy for 1 year.   Completed 6/6 cycles of Taxol/Carboplatin and will start maintenance immunotherapy therapy following radiation therapy and PET scan.     CHEMOTHERAPY:/RADIATION  CYCLE 18/26 DAY   DRUG DOSE TOTAL DOSE % ROUTE SCHEDULE   Carboplatin AUC- 2 205 mg    COMPLETED   Paclitaxel 45 mg/m2 108 mg    COMPLETED   Durvalumab     Q 2 wks                Will hold treatment today.    Shortness of breath/fever and chills:  - CT chest showed worsening pneumonia.  - will send patient to ER to be admitted to hospital.  Patient will need COVID test before admission.    No follow-ups on file.    Vanetta Mulders, MD  Hematology/Oncology

## 2019-12-10 NOTE — H&P (Signed)
[facility]  Hospitalist  Admission H&P    Date of Service:  12/10/2019  Kelsey [name], Kelsey [name], 69 y.o. female  Date of Admission:  12/10/2019  Date of Birth:  [birth year]  PCP: Benjie Karvonen          Information Obtained from: patient  Chief Complaint:  Cough,  shortness of breath and chills.    HPI: Kelsey [name] is a 69 y.o., White female with medical history significant for remote history of colon cancer, stage III adenocarcinoma of lung currently on chemotherapy, atrial fibrillation on Eliquis, COPD on home oxygen and hypertension who is referred from [facility] for evaluation of abnormal chest CT.  Patient went for her chemotherapy session today to oncology clinic, but she was having shortness of breath for which CT scan of the chest ordered by Dr. Deatra Canter , oncology which showed worsening patchy left mid and lower lung infiltrate concerning for pneumonia.  She was then referred to ED for further evaluation.  On further questioning she admits having cough with shortness of breath for the last few days.  Admits having chills but no fever.  Denies any urinary symptoms. No known exposure to COVID .  Blood cultures were taken, given Unasyn in the ED and now being admitted for rule out postobstructive pneumonia.    PAST MEDICAL:    Past Medical History:   Diagnosis Date    A-fib (CMS HCC)     Arthropathy, unspecified, site unspecified     Asthma     Cancer (CMS [place])     cervical    Cataract     Colon cancer (CMS [place]) 07/06/2014    COPD (chronic obstructive pulmonary disease) (CMS HCC)     Fistula     HTN (hypertension)     Hyperlipidemia     Lung mass     with pleural effusion    Obesity     Sleep apnea     Ventral hernia         Past Surgical History:   Procedure Laterality Date    ABSCESS DRAINAGE      BOWEL RESECTION      CATARACT EXTRACTION      HX CERVICAL CONE BIOPSY       HX CESAREAN SECTION      HX GASTRIC BYPASS      25 years ago    [place]  (x2)    HX HYSTERECTOMY      late 49s    HX LAP BANDING      2013    HX LAPAROTOMY      2013 to remove lap band            Medications Prior to Admission     Prescriptions    albuterol sulfate (PROVENTIL OR VENTOLIN OR PROAIR) 90 mcg/actuation Inhalation HFA Aerosol Inhaler    INHALE 1 TO 2 PUFFS BY MOUTH EVERY 6 HOURS AS NEEDED    albuterol sulfate (PROVENTIL) 2.5 mg /3 mL (0.083 %) Inhalation Solution for Nebulization    3 mL (2.5 mg total) by Nebulization route Every 6 hours    apixaban (ELIQUIS) 5 mg Oral Tablet    Take 1 Tab (5 mg total) by mouth Twice daily    atorvastatin (LIPITOR) 10 mg Oral Tablet    Take 10 mg by mouth Once a day    BREZTRI AEROSPHERE 160-9-4.8 mcg/actuation Inhalation HFA Aerosol Inhaler    INHALE 2  PUFFS BY MOUTH TWICE DAILY    budesonide-formoteroL (SYMBICORT) 160-4.5 mcg/actuation Inhalation HFA Aerosol Inhaler    Take 2 Puffs by inhalation Twice daily    carvediloL (COREG) 3.125 mg Oral Tablet    Take 3.125 mg by mouth Twice daily with food    docusate sodium (COLACE) 100 mg Oral Capsule    Take 100 mg by mouth Three times a day as needed     flecainide (TAMBOCOR) 100 mg Oral Tablet    Take 1 Tab (100 mg total) by mouth Twice daily    guaiFENesin (MUCINEX) 600 mg Oral Tablet Extended Release 12hr    Take 2 Tablets (1,200 mg total) by mouth Twice daily    levothyroxine (SYNTHROID) 112 mcg Oral Tablet    Take 1 Tablet (112 mcg total) by mouth Every morning    linaCLOtide (LINZESS) 72 mcg Oral Capsule    Take 72 mcg by mouth Every morning    lisinopriL (PRINIVIL) 20 mg Oral Tablet    Take 20 mg by mouth Once a day    loperamide (IMODIUM) 2 mg Oral Capsule    Take 2 mg by mouth Every 4 hours as needed    loratadine (CLARITIN) 10 mg Oral Tablet    TAKE 1 TABLET BY MOUTH EVERY DAY    magnesium oxide (MAG-OX) 400 mg Oral Tablet    Take 400 mg by mouth Once a day    omeprazole (PRILOSEC) 20 mg Oral Capsule, Delayed Release(E.C.)    Take 40 mg by mouth Once a day     oxyCODONE  (ROXICODONE) 5 mg Oral Tablet    Take 1 Tablet (5 mg total) by mouth Every 6 hours as needed for Pain    potassium chloride (K-DUR) 20 mEq Oral Tab Sust.Rel. Particle/Crystal    Take 20 mEq by mouth Once a day Takes 3 days a week    prochlorperazine (COMPAZINE) 10 mg Oral Tablet    Take 1 Tab (10 mg total) by mouth Four times a day as needed for Nausea/Vomiting    rOPINIRole (REQUIP) 0.5 mg Oral Tablet    Take 0.5 mg by mouth Every night    tiotropium bromide (SPIRIVA HANDIHALER) 18 mcg Inhalation Capsule, w/Inhalation Device    Take 1 Capsule (18 mcg total) by inhalation Once a day for 30 days    umeclidinium (INCRUSE ELLIPTA) 62.5 mcg/actuation Inhalation Disk with Device    Take 1 INHALATION by inhalation Once a day        Allergies   Allergen Reactions    Shellfish Containing Products Shortness of Breath, Rash and Itching     scallops    Vancomycin Shortness of Breath    Barium Iodide     Iodine And Iodide Containing Products      Itching, shortness of breath      Family History  Family Medical History:     Problem Relation (Age of Onset)    Diabetes Mother, Maternal Grandmother, Maternal Grandfather, Father, Sister    Heart Attack Mother    Stroke Father          Social History  Social History     Tobacco Use    Smoking status: Former Smoker    Smokeless tobacco: Never Used    Tobacco comment: quit 10 yrs ago   Substance Use Topics    Alcohol use: Not Currently     Comment: rarely       ROS: Other than ROS in the HPI, all other systems were  negative.  All other systems reviewed and negative other than history of present illness.    Examination:  Temperature: 36.4 C (97.5 F) Heart Rate: 80 BP (Non-Invasive): (!) 114/51   Respiratory Rate: (!) 21 SpO2: 99 %       Constitutional:  On supplemental nasal oxygen No distress, alert and oriented.  Eyes:  Conjunctiva clear, Pupils equal and round.   Neck:  Supple, symmetrical, trachea midline.  Respiratory:  Port-A-Cath in place. Clear to auscultation  bilaterally, no wheezes.  Cardiovascular:  Regular rate and rhythm, no murmurs.  Gastrointestinal:  Soft, non-tender, Bowel sounds normal, non-distended.  Musculoskeletal:  Head atraumatic and normocephalic.  Integumentary:  Skin warm and dry, No rashes or lesions.  Neurologic:  CN II - XII grossly intact, No tremor.     Labs:    CBC (Last 24 Hours):    Recent Results last 24 hours     12/10/19  0812 12/10/19  1051   WBC 8.3 9.0   HGB 10.7* 11.0*   HCT 33.1* 34.1*   MCV 72.8* 72.7*   PLTCNT 351 344         Basic Metabolic Profile    Lab Results   Component Value Date/Time    SODIUM 138 12/10/2019 10:51 AM    POTASSIUM 4.2 12/10/2019 10:51 AM    CHLORIDE 105 12/10/2019 10:51 AM    CO2 24 12/10/2019 10:51 AM    ANIONGAP 9 12/10/2019 10:51 AM    Lab Results   Component Value Date/Time    BUN 11 12/10/2019 10:51 AM    CREATININE 0.78 12/10/2019 10:51 AM    GLUCOSENF 100 12/10/2019 10:51 AM            Hepatic Function    Lab Results   Component Value Date/Time    ALBUMIN 3.2 12/10/2019 08:12 AM    TOTALPROTEIN 6.0 12/10/2019 08:12 AM    ALKPHOS 90 12/10/2019 08:12 AM    INR 1.44 (H) 12/01/2019 06:09 AM    Lab Results   Component Value Date/Time    AST 16 12/10/2019 08:12 AM    ALT 12 12/10/2019 08:12 AM    BILIRUBINCON 0.2 03/22/2018 05:10 AM              Imaging Studies:   No orders to display     CT scan of the chest done at the date of admission reviewed.    DNR Status:  Full Code    Assessment/Plan:   Active Hospital Problems    Diagnosis    CAP (community acquired pneumonia)     1-left lung pneumonia  Admit patient to Medicine with telemetry  Continue with Unasyn, doxycycline added for atypical coverage  DuoNeb breathing treatment  Mucinex for cough    2-history of lung cancer-stage III adenocarcinoma  Patient currently on chemotherapy  Will consult Dr. Deatra Canter her oncologist for further recommendation    3-remote history of colon cancer    4-history of atrial fibrillation  Continue home dose of Eliquis  Continue Coreg  and flecainide    4-history of COPD  Continue with DuoNeb breathing treatment  Supplemental oxygen    5-history of hypertension  Continue with Coreg and lisinopril      DVT/PE Prophylaxis: Apixaban  Disposition Planning: Home discharge    Code status full code  Nutrition-p.o. feeding  Lines-Port-A-Cath    Melizza Kanode Clint Guy, MD

## 2019-12-10 NOTE — ED Provider Notes (Signed)
Advanced Practice Provider: Estill Batten  Supervising Attending: Dr. Nicki Reaper, MD   Chief Complaint:  Chief Complaint   Patient presents with    Shortness of Breath     sent by pcp for admission  hx of pneumonia with no improvment  fever,body aches     History of Present Illness:  Kelsey [name] is a 69 y.o. female with a PMH of Lung Ca, COPD, Afib, HLD, HTN who presents via POV with a chief complaint of continued shortness of breath. The patient was sent her by her PCP for admission due to pneumonia. The patient was recently admitted and discharged on 12/03/19 due to pneumonia. She has not been on antibiotics since her discharge. She denies chest pain or cough. She does report associated fever (Tmax 100.4) and pain with inspiration. She is currently receiving chemotherapy due to her h/o Lung CA. She denies any other current complaints.     History Limitations: None    Review of Systems:  Constitutional: No chills or weakness +fever  Skin: No rashes or diaphoresis  HENT: No congestion or rhinorrhea  Eyes: No vision changes, discharge  Cardio: No chest pain, palpitations or leg swelling   Respiratory: No cough, wheezing +SOB, pain with inspiration  GI:  No nausea, vomiting, diarrhea, constipation or abdominal pain  GU:  No dysuria, hematuria, polyuria  MSK: No joint or back pain  Neuro: No loss of sensation, headache, focal deficits or LOC  Psych: No SI, HI or substance abuse.       Allergies:  Allergies   Allergen Reactions    Shellfish Containing Products Shortness of Breath, Rash and Itching     scallops    Vancomycin Shortness of Breath    Barium Iodide     Iodine And Iodide Containing Products      Itching, shortness of breath      Past Medical History:  Past Medical History:   Diagnosis Date    A-fib (CMS HCC)     Arthropathy, unspecified, site unspecified     Asthma     Cancer (CMS [place])     cervical    Cataract     Colon cancer (CMS [place]) 07/06/2014    COPD (chronic obstructive pulmonary disease) (CMS  HCC)     Fistula     HTN (hypertension)     Hyperlipidemia     Lung mass     with pleural effusion    Obesity     Sleep apnea     Ventral hernia          Past Surgical History:  Past Surgical History:   Procedure Laterality Date    ABSCESS DRAINAGE      BOWEL RESECTION      CATARACT EXTRACTION      HX CERVICAL CONE BIOPSY       HX CESAREAN SECTION      HX GASTRIC BYPASS      25 years ago    [place] (x2)    HX HYSTERECTOMY      late 6s    HX LAP BANDING      2013    HX LAPAROTOMY      2013 to remove lap band         Social History:  Social History     Tobacco Use    Smoking status: Former Smoker    Smokeless tobacco: Never Used    Tobacco comment: quit 10 yrs  ago   Substance Use Topics    Alcohol use: Not Currently     Comment: rarely     Family History:  Family Medical History:     Problem Relation (Age of Onset)    Diabetes Mother, Maternal Grandmother, Maternal Grandfather, Father, Sister    Heart Attack Mother    Stroke Father            Physical Exam:  All nurse's notes reviewed.  Filed Vitals:    12/10/19 1023   BP: 139/76   Pulse: 84   Resp: 18   Temp: 36.4 C (97.5 F)   SpO2: 96%      Constitutional: No acute distress.  Alert and Oriented x3.  HENT:   Head: Normocephalic, Atraumatic  Mouth/Throat: Oropharynx is clear and moist.  Eyes:Conjunctivae without discharge.  Neck: Trachea midline.   Cardiovascular: Regular rate and rhythm  Pulmonary/Chest: Decreased breath sounds. No respiratory distress. No wheezes, rales or chest tenderness.  Abdominal: Normal Bowel Sounds. Abdomen soft, no tenderness, rebound or guarding.  Musculoskeletal: No obvious deformity, swelling. 2+ peripheral pulses  Skin: Warm and dry. No rash, erythema, pallor or cyanosis.  Psychiatric: Behavior is normal. Mood and affect congruent.  Neurological: Alert & Oriented x3. Grossly intact. Moves all extremities spontaneously.    Orders, Abnormal Labs and Imaging Results:  Results up to the Time  the Disposition was Entered   BASIC METABOLIC PANEL - Abnormal; Notable for the following components:       Result Value    CALCIUM 8.7 (*)     All other components within normal limits    Narrative:     Estimated Glomerular Filtration Rate (eGFR) calculated using the CKD-EPI (2009) equation, intended for patients 81 years of age and older. If race and/or gender is not documented or "unknown," there will be no eGFR calculation.   CBC WITH DIFF - Abnormal; Notable for the following components:    HGB 11.0 (*)     HCT 34.1 (*)     MCV 72.7 (*)     MCH 23.4 (*)     MCHC 32.1 (*)     RDW 19.0 (*)     NEUTROPHIL # 6.90 (*)     All other components within normal limits   VENOUS BLOOD GAS - Abnormal; Notable for the following components:    BICARBONATE (VENOUS) 22.8 (*)     O2 SATURATION (VENOUS) 58.5 (*)     All other components within normal limits   TROPONIN-I - Normal   LACTIC ACID LEVEL - Normal   CBC/DIFF    Narrative:     The following orders were created for panel order CBC/DIFF.  Procedure                               Abnormality         Status                     ---------                               -----------         ------                     CBC WITH BWGY[[id number]]  Abnormal            Final result                 Please view results for these tests on the individual orders.   ECG 12 LEAD - ED USE    Narrative:     -------------------------ECG Interpretation-----------------------------------  Sinus rhythm  When compared with ECG of 30-Nov-2019 13:41:29,  Significant change in rhythm  Change in clinical status  .  Marland Kitchen  MD Signature: Unconfirmed Diagnosis   ADULT ROUTINE BLOOD CULTURE, SET OF 2 BOTTLES (BACTERIA AND YEAST)   ADULT ROUTINE BLOOD CULTURE, SET OF 2 BOTTLES (BACTERIA AND YEAST)   ENHANCED DROPLET ISOLATION (FOR COVID-19)   COVID-19, FLU A/B, RSV RAPID BY PCR   ampicillin-sulbactam (UNASYN) 3 g in NS 100 mL IVPB minibag (3 g Intravenous New Bag/New Syringe 12/10/19 1122)       EKG:  Reviewed by attending  MDM:       Presents with shortness and cough   Pt was vitally stable on arrival.    Pt received: unasyn   Imaging: Reviewed, as above   Labs: Reviewed, as above   Results discussed with pt.    Plan: Admit for treatment of pneumonia   Pt is agreeable with plan.    Pt was given the opportunity to ask questions and denies any questions/concerns at this time.     Consults: Dr. Leonides Grills, hosplitalist. Accepts patient  Impression:   Encounter Diagnosis   Name Primary?    Pneumonia Yes     Disposition:  Admitted  Admission: Patient will be admitted to hospitalist service for further evaluation and management.      Follow Up:   No follow-up provider specified.  Prescriptions:      Current Discharge Medication List      CONTINUE these medications - NO CHANGES were made during your visit.      Details   * albuterol sulfate 2.5 mg /3 mL (0.083 %) Solution for Nebulization  Commonly known as: PROVENTIL   2.5 mg, Nebulization, EVERY 6 HOURS  Qty: 30 Each  Refills: 3     * albuterol sulfate 90 mcg/actuation HFA Aerosol Inhaler  Commonly known as: PROVENTIL or VENTOLIN or PROAIR   INHALE 1 TO 2 PUFFS BY MOUTH EVERY 6 HOURS AS NEEDED  Qty: 25.5 g  Refills: 0     apixaban 5 mg Tablet  Commonly known as: ELIQUIS   5 mg, Oral, 2 TIMES DAILY  Refills: 0     atorvastatin 10 mg Tablet  Commonly known as: LIPITOR   10 mg, Oral, DAILY  Refills: 0     Breztri Aerosphere 160-9-4.8 mcg/actuation HFA Aerosol Inhaler  Generic drug: budesonide-glycopyr-formoterol   INHALE 2 PUFFS BY MOUTH TWICE DAILY  Refills: 0     budesonide-formoteroL 160-4.5 mcg/actuation HFA Aerosol Inhaler  Commonly known as: SYMBICORT   2 Puffs, Inhalation, 2 TIMES DAILY  Qty: 1 Inhaler  Refills: 0     carvediloL 3.125 mg Tablet  Commonly known as: COREG   3.125 mg, Oral, 2 TIMES DAILY WITH FOOD  Refills: 0     docusate sodium 100 mg Capsule  Commonly known as: COLACE   100 mg, Oral, 3 TIMES DAILY PRN  Refills: 0     flecainide 100 mg  Tablet  Commonly known as: TAMBOCOR   100 mg, Oral, 2 TIMES DAILY  Qty: 180 Tab  Refills: 3     guaiFENesin 600 mg Tablet  Extended Release 12hr  Commonly known as: Mucinex   1,200 mg, Oral, 2 TIMES DAILY  Qty: 360 Tablet  Refills: 3     levothyroxine 112 mcg Tablet  Commonly known as: SYNTHROID   112 mcg, Oral, EVERY MORNING  Qty: 90 Tablet  Refills: 4     Linzess 72 mcg Capsule  Generic drug: linaCLOtide   72 mcg, Oral, EVERY MORNING  Refills: 0     lisinopriL 20 mg Tablet  Commonly known as: PRINIVIL   20 mg, Oral, DAILY  Refills: 0     loperamide 2 mg Capsule  Commonly known as: IMODIUM   2 mg, Oral, EVERY 4 HOURS PRN  Refills: 0     loratadine 10 mg Tablet  Commonly known as: CLARITIN   TAKE 1 TABLET BY MOUTH EVERY DAY  Qty: 90 Tablet  Refills: 0     magnesium oxide 400 mg Tablet  Commonly known as: MAG-OX   400 mg, Oral, DAILY  Refills: 0     omeprazole 20 mg Capsule, Delayed Release(E.C.)  Commonly known as: PRILOSEC   40 mg, Oral, DAILY  Refills: 0     oxyCODONE 5 mg Tablet  Commonly known as: ROXICODONE   5 mg, Oral, EVERY 6 HOURS PRN  Qty: 120 Tablet  Refills: 0     potassium chloride 20 mEq Tab Sust.Rel. Particle/Crystal  Commonly known as: K-DUR   20 mEq, Oral, DAILY, Takes 3 days a week  Refills: 0     prochlorperazine 10 mg Tablet  Commonly known as: Compazine   10 mg, Oral, 4 TIMES DAILY PRN  Qty: 60 Tab  Refills: 5     rOPINIRole 0.5 mg Tablet  Commonly known as: REQUIP   0.5 mg, Oral, NIGHTLY  Refills: 0     tiotropium bromide 18 mcg Capsule, w/Inhalation Device  Commonly known as: SPIRIVA HANDIHALER   18 mcg, Inhalation, DAILY  Qty: 30 Capsule  Refills: 0     umeclidinium 62.5 mcg/actuation Disk with Device  Commonly known as: INCRUSE ELLIPTA   1 INHALATION, Inhalation, DAILY  Qty: 3 Each  Refills: 4         * This list has 2 medication(s) that are the same as other medications prescribed for you. Read the directions carefully, and ask your doctor or other care provider to review them with you.                    No future appointments.    I am scribing for, and in the presence of, Estill Batten, APRN, FNP-BC for services provided on 12/10/2019.  Harrington Challenger, SCRIBE   Harrington Challenger, [place]  12/10/2019, 10:42    Patient seen independently with co-signing physician present in clinic.     I personally performed the services described in this documentation, as scribed  in my presence, and it is both accurate  and complete.    Estill Batten, APRN,FNP-BC

## 2019-12-10 NOTE — Care Plan (Signed)
I have reviewed this patient's orders and plan of care.  Currently this patient meets requirements for a low to mid level of nursing care.    Patient has been resting well since admission. Sh has however been desatting when getting up to bedside commode. Patient instructed to call for nursing when she needs to get up. She is currently on 4L NC. She has a continuous pulse ox. She has been NSR/sinus tach on monitor. She has no other complaints at this time. Shellia Cleverly, RN

## 2019-12-10 NOTE — Care Plan (Signed)
Problem: Adult Inpatient Plan of Care  Goal: Plan of Care Review  Outcome: Ongoing (see interventions/notes)  Goal: Patient-Specific Goal (Individualized)  Outcome: Ongoing (see interventions/notes)  Goal: Absence of Hospital-Acquired Illness or Injury  Outcome: Ongoing (see interventions/notes)  Goal: Optimal Comfort and Wellbeing  Outcome: Ongoing (see interventions/notes)  Goal: Rounds/Family Conference  Outcome: Ongoing (see interventions/notes)     Problem: Fluid Imbalance (Pneumonia)  Goal: Fluid Balance  Outcome: Ongoing (see interventions/notes)     Problem: Infection (Pneumonia)  Goal: Resolution of Infection Signs and Symptoms  Outcome: Ongoing (see interventions/notes)     Problem: Respiratory Compromise (Pneumonia)  Goal: Effective Oxygenation and Ventilation  Outcome: Ongoing (see interventions/notes)     Problem: Breathing Pattern Ineffective  Goal: Effective Breathing Pattern  Outcome: Ongoing (see interventions/notes)     Problem: Fall Injury Risk  Goal: Absence of Fall and Fall-Related Injury  Outcome: Ongoing (see interventions/notes)

## 2019-12-10 NOTE — ED Nurses Note (Signed)
Called report to [employer]. Pt transported via ED stretcher to [id number].

## 2019-12-11 LAB — CBC WITH DIFF
BASOPHIL #: 0 10*3/uL (ref 0.00–0.20)
BASOPHIL %: 1 %
EOSINOPHIL #: 0.2 10*3/uL (ref 0.00–0.50)
EOSINOPHIL %: 3 %
HCT: 29.2 % — ABNORMAL LOW (ref 34.6–46.2)
HGB: 9.4 g/dL — ABNORMAL LOW (ref 11.8–15.8)
LYMPHOCYTE #: 0.6 10*3/uL — ABNORMAL LOW (ref 0.90–3.40)
LYMPHOCYTE %: 10 %
MCH: 23.4 pg — ABNORMAL LOW (ref 27.6–33.2)
MCHC: 32.3 g/dL — ABNORMAL LOW (ref 32.6–35.4)
MCV: 72.4 fL — ABNORMAL LOW (ref 82.3–96.7)
MONOCYTE #: 0.8 10*3/uL (ref 0.20–0.90)
MONOCYTE %: 12 %
MPV: 7.3 fL (ref 6.6–10.2)
NEUTROPHIL #: 5.1 10*3/uL (ref 1.50–6.40)
NEUTROPHIL %: 75 %
PLATELETS: 297 10*3/uL (ref 140–440)
RBC: 4.03 10*6/uL (ref 3.80–5.24)
RDW: 18.3 % — ABNORMAL HIGH (ref 12.4–15.2)
WBC: 6.7 10*3/uL (ref 3.5–10.3)

## 2019-12-11 LAB — BASIC METABOLIC PANEL
ANION GAP: 4 mmol/L
BUN/CREA RATIO: 16
BUN: 13 mg/dL (ref 10–25)
CALCIUM: 8.2 mg/dL — ABNORMAL LOW (ref 8.8–10.3)
CHLORIDE: 108 mmol/L (ref 98–111)
CO2 TOTAL: 27 mmol/L (ref 21–35)
CREATININE: 0.81 mg/dL (ref <=1.30)
ESTIMATED GFR: >60 mL/min/{1.73_m2}
GLUCOSE: 100 mg/dL (ref 70–110)
POTASSIUM: 4.3 mmol/L (ref 3.5–5.0)
SODIUM: 139 mmol/L (ref 135–145)

## 2019-12-11 LAB — PT/INR: INR: 1.44 — ABNORMAL HIGH (ref 0.80–1.10)

## 2019-12-11 LAB — MAGNESIUM: MAGNESIUM: 2 mg/dL (ref 1.8–2.3)

## 2019-12-11 LAB — PHOSPHORUS: PHOSPHORUS: 3.9 mg/dL (ref 2.5–4.5)

## 2019-12-11 NOTE — Care Plan (Signed)
Problem: Adult Inpatient Plan of Care  Goal: Plan of Care Review  Outcome: Ongoing (see interventions/notes)  Goal: Patient-Specific Goal (Individualized)  Outcome: Ongoing (see interventions/notes)  Goal: Absence of Hospital-Acquired Illness or Injury  Outcome: Ongoing (see interventions/notes)  Intervention: Identify and Manage Fall Risk  Flowsheets (Taken 12/11/2019 0000)  Safety Promotion/Fall Prevention:   activity supervised   fall prevention program maintained   nonskid shoes/slippers when out of bed   safety round/check completed  Intervention: Prevent Skin Injury  Flowsheets (Taken 12/10/2019 2000)  Body Position: positioned/repositioned independently  Intervention: Prevent and Manage VTE (Venous Thromboembolism) Risk  Flowsheets (Taken 12/10/2019 2000)  VTE Prevention/Management: ambulation promoted  Goal: Optimal Comfort and Wellbeing  Outcome: Ongoing (see interventions/notes)  Intervention: Provide Person-Centered Care  Flowsheets (Taken 12/10/2019 2000)  Trust Relationship/Rapport:   care explained   choices provided   emotional support provided   empathic listening provided   questions encouraged   questions answered   reassurance provided   thoughts/feelings acknowledged  Goal: Rounds/Family Conference  Outcome: Ongoing (see interventions/notes)     Problem: Fluid Imbalance (Pneumonia)  Goal: Fluid Balance  Outcome: Ongoing (see interventions/notes)     Problem: Infection (Pneumonia)  Goal: Resolution of Infection Signs and Symptoms  Outcome: Ongoing (see interventions/notes)  Intervention: Prevent Infection Progression  Flowsheets (Taken 12/10/2019 2000)  Fever Reduction/Comfort Measures:   lightweight bedding   lightweight clothing     Problem: Respiratory Compromise (Pneumonia)  Goal: Effective Oxygenation and Ventilation  Outcome: Ongoing (see interventions/notes)  Intervention: Promote Airway Secretion Clearance  Flowsheets (Taken 12/11/2019 0000)  Cough And Deep Breathing: done with  encouragement  Intervention: Optimize Oxygenation and Ventilation  Flowsheets (Taken 12/10/2019 2000)  Head of Bed (HOB) Positioning: HOB elevated     Problem: Breathing Pattern Ineffective  Goal: Effective Breathing Pattern  Outcome: Ongoing (see interventions/notes)  Intervention: Promote Improved Breathing Pattern  Flowsheets (Taken 12/10/2019 2000)  Head of Bed [facility]) Positioning: HOB elevated     Problem: Fall Injury Risk  Goal: Absence of Fall and Fall-Related Injury  Outcome: Ongoing (see interventions/notes)  Intervention: Identify and Manage Contributors  Flowsheets (Taken 12/10/2019 2000)  Self-Care Promotion:   independence encouraged   BADL personal objects within reach  Intervention: Promote [place] (Taken 12/11/2019 0000)  Safety Promotion/Fall Prevention:   activity supervised   fall prevention program maintained   nonskid shoes/slippers when out of bed   safety round/check completed     Problem: Fever (Fever with Neutropenia)  Goal: Baseline Body Temperature  Outcome: Ongoing (see interventions/notes)     Problem: Infection Risk (Fever with Neutropenia)  Goal: Absence of Infection  Outcome: Ongoing (see interventions/notes)     Problem: Asthma Comorbidity  Goal: Maintenance of Asthma Control  Outcome: Ongoing (see interventions/notes)     Problem: Hypertension Comorbidity  Goal: Blood Pressure in Desired Range  Outcome: Ongoing (see interventions/notes)  Intervention: Maintain Blood Pressure Management  Flowsheets (Taken 12/11/2019 0211)  Syncope Management: position changed slowly     Problem: Obstructive Sleep Apnea Risk or Actual (Comorbidity Management)  Goal: Unobstructed Breathing During Sleep  Outcome: Ongoing (see interventions/notes)     Problem: Health Knowledge, Opportunity to Enhance (Adult,Obstetrics,Pediatric)  Goal: Knowledgeable about Health Subject/Topic  Description: Patient will demonstrate the desired outcomes by discharge/transition of care.  Outcome:  Ongoing (see interventions/notes)  Intervention: Enhance Health Knowledge  Flowsheets (Taken 12/10/2019 2000)  Family/Support System Care:   self-care encouraged   support provided

## 2019-12-11 NOTE — Care Plan (Signed)
Patient is alert and oriented. Vitals have been stable. Patient is on 3L of oxygen NC. Continuous pulse ox monitored. Patient has been normal sinus rhythm on the monitor. Bedside commode in room. Shortness of breath with exertion. Right chest port remains patent and intact. Patient takes medications crushed with applesauce/pudding. I will continue to monitor and re-evaluate for any changes.  Jacinta Shoe, RN

## 2019-12-11 NOTE — Progress Notes (Signed)
IP PROGRESS NOTE      Kelsey [name], Kelsey [name]  Date of Admission:  12/10/2019  Date of Birth:  [birth year]  Date of Service:  12/11/2019    Chief Complaint:  Follow-up for pneumonia  Subjective:  Malaise    Patient states that she feels much better after started with IV antibiotics.    Problem List:  Active Hospital Problems   (*Primary Problem)    Diagnosis    CAP (community acquired pneumonia)     Assessment/ Plan:     1-left lung pneumonia  Continue with Unasyn, doxycycline.  DuoNeb breathing treatment  Mucinex for cough    2-history of lung cancer-stage III adenocarcinoma  Patient currently on chemotherapy  Will consult Dr. Deatra Canter her oncologist for further recommendation    3-remote history of colon cancer    4-history of atrial fibrillation  Continue home dose of Eliquis  Continue Coreg and flecainide    4-history of COPD  Continue with DuoNeb breathing treatment  Supplemental oxygen    5-history of hypertension  Continue with Coreg and lisinopril      DVT/PE Prophylaxis: Apixaban  Disposition Planning: Home discharge    Code status full code  Nutrition-p.o. feeding  Lines-Port-A-Cath      Vital Signs:  Temp (24hrs) Max:36.8 C (98.2 F)      Temperature: 36.6 C (97.9 F)  BP (Non-Invasive): 110/77  MAP (Non-Invasive): 83 mmHG  Heart Rate: 94  Respiratory Rate: 18  Pain Score (Numeric, Faces): 5  SpO2: 95 %      Objective     Current Medications:  acetaminophen (TYLENOL) tablet, 650 mg, Oral, Q6H PRN  ampicillin-sulbactam (UNASYN) 3 g in NS 100 mL IVPB minibag, 3 g, Intravenous, Q6H  apixaban (ELIQUIS) tablet, 5 mg, Oral, 2x/day  atorvastatin (LIPITOR) tablet, 10 mg, Oral, Daily  budesonide-formoterol (SYMBICORT) 160 mcg-4.5 mcg per inhalation oral inhaler, 2 Puff, Inhalation, 2x/day  carvedilol (COREG) tablet, 3.125 mg, Oral, 2x/day-Food  docusate sodium (COLACE) capsule, 100 mg, Oral, 3x/day PRN  doxycycline hyclate (VIBRAMYCIN) capsule, 100 mg, Oral, 2x/day  flecainide (TAMBOCOR) tablet, 100 mg, Oral,  2x/day  guaiFENesin (MUCINEX) extended release tablet - for cough (expectorant), 1,200 mg, Oral, 2x/day  ipratropium-albuterol 0.5 mg-3 mg(2.5 mg base)/3 mL Solution for Nebulization, 3 mL, Nebulization, Q6H PRN  levothyroxine (SYNTHROID) tablet, 112 mcg, Oral, QAM  linaclotide (LINZESS) capsule, 72 mcg, Oral, QAM  lisinopril (PRINIVIL) tablet, 20 mg, Oral, Daily  loratadine (CLARITIN) tablet, 10 mg, Oral, Daily  magnesium oxide (MAG-OX) tablet, 400 mg, Oral, Daily  NS flush syringe, 3 mL, Intracatheter, Q8HRS  NS flush syringe, 3 mL, Intracatheter, Q1H PRN  ondansetron (ZOFRAN) 2 mg/mL injection, 4 mg, Intravenous, Q8H PRN  oxyCODONE (ROXICODONE) immediate release tablet, 5 mg, Oral, Q6H PRN  pantoprazole (PROTONIX) delayed release tablet, 40 mg, Oral, Daily before Breakfast  prochlorperazine (COMPAZINE) tablet, 10 mg, Oral, 4x/day PRN  rOPINIRole (REQUIP) tablet, 0.5 mg, Oral, NIGHTLY  tiotropium bromide (SPIRIVA HANDIHALER) 18 mcg caps with inhalation device, 18 mcg, Inhalation, Daily        Today's Physical Exam:  Constitutional:  On supplemental nasal oxygen No distress, alert and oriented.  Eyes:  Conjunctiva clear, Pupils equal and round.   Neck:  Supple, symmetrical, trachea midline.  Respiratory:  Port-A-Cath in place. Clear to auscultation bilaterally, no wheezes.  Cardiovascular:  Regular rate and rhythm, no murmurs.  Gastrointestinal:  Soft, non-tender, Bowel sounds normal, non-distended.  Musculoskeletal:  Head atraumatic and normocephalic.  Integumentary:  Skin warm and dry, No rashes  or lesions.  Neurologic:  CN II - XII grossly intact, No tremor.    I/O:  I/O last 24 hours:      Intake/Output Summary (Last 24 hours) at 12/11/2019 1555  Last data filed at 12/11/2019 1400  Gross per 24 hour   Intake 2985 ml   Output --   Net 2985 ml     I/O current shift:  04/23 0700 - 04/23 1859  In: 9191 [P.O.:1315]  Out: -       Labs  Please indicate ordered or reviewed)  Reviewed: I have reviewed all lab  results.    Radiology Tests (Please indicate ordered or reviewed)  Reviewed: personally reviewed all available images    Medical decision making:  All pertinent labs were personally reviewed with additional follow-up lab work ordered as deemed clinically appropriate.  All available imaging results and EKGs were reviewed and independently interpreted.  Pertinent previous medical record results were reviewed.  Further workup and treatment depending on clinical cours

## 2019-12-12 DIAGNOSIS — D649 Anemia, unspecified: Secondary | ICD-10-CM

## 2019-12-12 DIAGNOSIS — I480 Paroxysmal atrial fibrillation: Secondary | ICD-10-CM

## 2019-12-12 DIAGNOSIS — E039 Hypothyroidism, unspecified: Secondary | ICD-10-CM

## 2019-12-12 LAB — CBC WITH DIFF
BASOPHIL #: 0 10*3/uL (ref 0.00–0.20)
BASOPHIL %: 0 %
EOSINOPHIL #: 0.2 10*3/uL (ref 0.00–0.50)
EOSINOPHIL %: 3 %
HCT: 28.4 % — ABNORMAL LOW (ref 34.6–46.2)
HGB: 9.1 g/dL — ABNORMAL LOW (ref 11.8–15.8)
LYMPHOCYTE #: 0.7 10*3/uL — ABNORMAL LOW (ref 0.90–3.40)
LYMPHOCYTE %: 10 %
MCH: 23.4 pg — ABNORMAL LOW (ref 27.6–33.2)
MCHC: 32.2 g/dL — ABNORMAL LOW (ref 32.6–35.4)
MCV: 72.9 fL — ABNORMAL LOW (ref 82.3–96.7)
MONOCYTE #: 0.9 10*3/uL (ref 0.20–0.90)
MONOCYTE %: 12 %
MPV: 7.2 fL (ref 6.6–10.2)
NEUTROPHIL #: 5.8 10*3/uL (ref 1.50–6.40)
NEUTROPHIL %: 76 %
PLATELETS: 268 10*3/uL (ref 140–440)
RBC: 3.89 10*6/uL (ref 3.80–5.24)
RDW: 18.2 % — ABNORMAL HIGH (ref 12.4–15.2)
WBC: 7.6 10*3/uL (ref 3.5–10.3)

## 2019-12-12 LAB — BASIC METABOLIC PANEL
ANION GAP: 6 mmol/L
BUN/CREA RATIO: 13
BUN: 11 mg/dL (ref 10–25)
CALCIUM: 8.1 mg/dL — ABNORMAL LOW (ref 8.8–10.3)
CHLORIDE: 106 mmol/L (ref 98–111)
CO2 TOTAL: 27 mmol/L (ref 21–35)
CREATININE: 0.82 mg/dL (ref <=1.30)
ESTIMATED GFR: >60 mL/min/{1.73_m2}
GLUCOSE: 108 mg/dL (ref 70–110)
POTASSIUM: 4.3 mmol/L (ref 3.5–5.0)
SODIUM: 139 mmol/L (ref 135–145)

## 2019-12-12 LAB — FOLATE: FOLATE: 9.9 ng/mL (ref >=5.9)

## 2019-12-12 LAB — IRON TRANSFERRIN AND TIBC
IRON (TRANSFERRIN) SATURATION: 8 % — ABNORMAL LOW (ref 20–50)
IRON: 32 ug/dL — ABNORMAL LOW (ref 60–140)
TOTAL IRON BINDING CAPACITY: 379 ug/dL (ref 250–425)
TRANSFERRIN: 271 mg/dL (ref 200–360)

## 2019-12-12 LAB — RETICULOCYTE COUNT: RETICULOCYTE % AUTOMATED: 2.2 % — ABNORMAL HIGH (ref 0.6–2.1)

## 2019-12-12 LAB — VITAMIN B12: VITAMIN B 12: 257 pg/mL (ref 180–914)

## 2019-12-12 LAB — MAGNESIUM: MAGNESIUM: 1.9 mg/dL (ref 1.8–2.3)

## 2019-12-12 NOTE — Progress Notes (Signed)
IP PROGRESS NOTE      Kelsey [name], Kelsey [name]  Date of Admission:  12/10/2019  Date of Birth:  01-08-1951  Date of Service:  12/12/2019    Chief Complaint:  Follow-up for pneumonia  Subjective:  Malaise    Patient states that she feels much better after started with IV antibiotics.    Problem List:  Active Hospital Problems   (*Primary Problem)    Diagnosis    CAP (community acquired pneumonia)     Assessment/ Plan:     Left lung pneumonia, Community Acquired  Stage III Adenocarcinoma of Lung, currently on chemotherapy  Remote history of Colon Cancer  Paroxysmal Afib  COPD  Hypertension  - Continue with Unasyn, doxycycline.  - Bronchodilators.  Mucinex.  - Oncology following. Appreciate recs.  - Continue home dose of Eliquis  - Continue Coreg and flecainide  - Continue with Coreg and lisinopril      DVT/PE Prophylaxis: Apixaban  Disposition Planning: Home discharge    Code status full code  Nutrition-p.o. feeding  Lines-Port-A-Cath      Vital Signs:  Temp (24hrs) Max:36.7 C (98.1 F)      Temperature: 36.7 C (98.1 F)  BP (Non-Invasive): (!) 123/57  MAP (Non-Invasive): 86 mmHG  Heart Rate: 86  Respiratory Rate: 20  Pain Score (Numeric, Faces): 0  SpO2: 98 %      Objective     Current Medications:  acetaminophen (TYLENOL) tablet, 650 mg, Oral, Q6H PRN  ampicillin-sulbactam (UNASYN) 3 g in NS 100 mL IVPB minibag, 3 g, Intravenous, Q6H  apixaban (ELIQUIS) tablet, 5 mg, Oral, 2x/day  atorvastatin (LIPITOR) tablet, 10 mg, Oral, Daily  budesonide-formoterol (SYMBICORT) 160 mcg-4.5 mcg per inhalation oral inhaler, 2 Puff, Inhalation, 2x/day  carvedilol (COREG) tablet, 3.125 mg, Oral, 2x/day-Food  docusate sodium (COLACE) capsule, 100 mg, Oral, 3x/day PRN  doxycycline hyclate (VIBRAMYCIN) capsule, 100 mg, Oral, 2x/day  flecainide (TAMBOCOR) tablet, 100 mg, Oral, 2x/day  guaiFENesin (MUCINEX) extended release tablet - for cough (expectorant), 1,200 mg, Oral, 2x/day  ipratropium-albuterol 0.5 mg-3 mg(2.5 mg base)/3 mL Solution  for Nebulization, 3 mL, Nebulization, Q6H PRN  levothyroxine (SYNTHROID) tablet, 112 mcg, Oral, QAM  linaclotide (LINZESS) capsule, 72 mcg, Oral, QAM  lisinopril (PRINIVIL) tablet, 20 mg, Oral, Daily  loratadine (CLARITIN) tablet, 10 mg, Oral, Daily  magnesium oxide (MAG-OX) tablet, 400 mg, Oral, Daily  NS flush syringe, 3 mL, Intracatheter, Q8HRS  NS flush syringe, 3 mL, Intracatheter, Q1H PRN  ondansetron (ZOFRAN) 2 mg/mL injection, 4 mg, Intravenous, Q8H PRN  oxyCODONE (ROXICODONE) immediate release tablet, 5 mg, Oral, Q6H PRN  pantoprazole (PROTONIX) delayed release tablet, 40 mg, Oral, Daily before Breakfast  prochlorperazine (COMPAZINE) tablet, 10 mg, Oral, 4x/day PRN  rOPINIRole (REQUIP) tablet, 0.5 mg, Oral, NIGHTLY  tiotropium bromide (SPIRIVA HANDIHALER) 18 mcg caps with inhalation device, 18 mcg, Inhalation, Daily        Today's Physical Exam:    Alert and oriented x3.  No acute distress.  Pleasant.  Mood affect and speech were all normal.  Pupils are equal reactive to light.  Extraocular movements intact.  Regular rate, no murmurs.  Lungs clear to auscultation.  Good breath sounds.  Abdomen is soft, nontender, nondistended.  Extremities are warm.  Good pulses.  No edema.  Skin has no rashes.  No jaundice.  Cranial nerves 2-12 grossly intact.      I/O:  I/O last 24 hours:      Intake/Output Summary (Last 24 hours) at 12/12/2019 1312  Last data  filed at 12/12/2019 0900  Gross per 24 hour   Intake 911 ml   Output --   Net 911 ml     I/O current shift:  04/24 0700 - 04/24 1859  In: 240 [P.O.:240]  Out: -     Darcus Pester, MD    This note may have been partially generated using MModal Fluency Direct system, and there may be some incorrect words, spellings, and punctuation that were not noted in checking the note before saving.

## 2019-12-12 NOTE — Care Plan (Signed)
Patient is alert and oriented with no complaints of pain.  Patient ambulated well independently.    Vital signs stable.  Patient stated she was feeling much improved since admission.  Tolerating Unasyn well.  Patient appeared to sleep soundly through most of night.  Patient expressed concern that she was developing thrush and may need medication to help with diarrhea.  I have reviewed this patient's orders and plan of care.  Currently this patient meets requirements for a low to mid level of nursing care.  Sudie Grumbling, RN    Problem: Adult Inpatient Plan of Care  Goal: Plan of Care Review  Outcome: Ongoing (see interventions/notes)  Goal: Patient-Specific Goal (Individualized)  Outcome: Ongoing (see interventions/notes)  Goal: Absence of Hospital-Acquired Illness or Injury  Outcome: Ongoing (see interventions/notes)  Goal: Optimal Comfort and Wellbeing  Outcome: Ongoing (see interventions/notes)  Goal: Rounds/Family Conference  Outcome: Ongoing (see interventions/notes)     Problem: Fluid Imbalance (Pneumonia)  Goal: Fluid Balance  Outcome: Ongoing (see interventions/notes)     Problem: Infection (Pneumonia)  Goal: Resolution of Infection Signs and Symptoms  Outcome: Ongoing (see interventions/notes)     Problem: Respiratory Compromise (Pneumonia)  Goal: Effective Oxygenation and Ventilation  Outcome: Ongoing (see interventions/notes)     Problem: Breathing Pattern Ineffective  Goal: Effective Breathing Pattern  Outcome: Ongoing (see interventions/notes)     Problem: Fall Injury Risk  Goal: Absence of Fall and Fall-Related Injury  Outcome: Ongoing (see interventions/notes)     Problem: Fever (Fever with Neutropenia)  Goal: Baseline Body Temperature  Outcome: Ongoing (see interventions/notes)     Problem: Infection Risk (Fever with Neutropenia)  Goal: Absence of Infection  Outcome: Ongoing (see interventions/notes)     Problem: Asthma Comorbidity  Goal: Maintenance of Asthma Control  Outcome: Ongoing (see  interventions/notes)     Problem: Hypertension Comorbidity  Goal: Blood Pressure in Desired Range  Outcome: Ongoing (see interventions/notes)     Problem: Obstructive Sleep Apnea Risk or Actual (Comorbidity Management)  Goal: Unobstructed Breathing During Sleep  Outcome: Ongoing (see interventions/notes)     Problem: Health Knowledge, Opportunity to Enhance (Adult,Obstetrics,Pediatric)  Goal: Knowledgeable about Health Subject/Topic  Description: Patient will demonstrate the desired outcomes by discharge/transition of care.  Outcome: Ongoing (see interventions/notes)

## 2019-12-12 NOTE — Consults (Signed)
@DEPARTMENTNAME @    Encounter Date: 12/10/2019  @[email or web address] @    Name:  Kelsey [name]  Age: 69 y.o.  DOB: November 13, 1950  Sex: female    CHIEF COMPLAINT:  PNEUMONIA      HISTORY OF PRESENTING ILLNESS:  69 year old female with prior history of colon cancer, stage III lung cancer status post concurrent chemoradiation on durvalumab currently admitted with pneumonia.  Patient follows with Dr. Deatra Canter she, she was seen in clinic on 12/10/2019, and she reported fevers with T-max 101 F, with shortness of breath and productive cough.  She was subsequently referred to the ED.  CT chest showed worsening left mid and lower lobe infiltrates concerning for pneumonia.  She was admitted and is currently on Unasyn/doxycycline.  Patient was also recently admitted on 12/03/2019 with pneumonia and was on Unasyn and Zithromax for 7 days.  Blood cultures have remained negative.  COVID test was also negative .    Patient subjectively feels much better.  Shortness of breath and cough have improved.    she is currently on 3 L nasal cannula, he does at home      PAST MEDICAL HISTORY:  Colon cancer, the patient, COPD, hypertension, hyperlipidemia, OSA      PAST SURGICAL HISTORY:  C-section, gastric bypass, hernia repair, hysterectomy    FAMILY HISTORY:  No family history of cancer    SOCIAL HISTORY:  Former smoker, denies alcohol, denies drug    ALLERGIES:  Vancomycin causes dyspnea    REVIEW OF SYSTEMS:  General: []  pain. []  fevers []  chills. []  weight loss. [x]  fatigue.  Lymphatic: []  palpable masses. []  night sweats.  Heme: []  easy bruising []  bleeding. []  recurrent infections.   HEENT. []  vision changes []  hearing changes. []  dysphagia. []  sore throat.   Heart: []  chest pain. []  palpitation. []  orthopnea. []  LE edema.   Lungs: []  dyspnea (on exertion) []  hemoptysis. []  cough.   Abdomen: []  poor appetite. []  abdominal pain. []  nausea []  vomiting. []  diarrhea. []  constipation.   GU: []  dysuria []  Urgency. []  Hematuria.   MS. []   joint pain []  ext swelling. []  Back pain.   Dermatologic: []  rashes. []  pruritus.   Psychiatric: []  Depression. []  anxiety. []  insomnia.   Neurologic: []  headaches. []  neuropathy. []  weakness. []  memory problems.     PATIENT HISTORY:  Past Medical History:   Diagnosis Date    A-fib (CMS HCC)     Arthropathy, unspecified, site unspecified     Asthma     Cancer (CMS [place])     cervical    Cataract     Colon cancer (CMS [place]) 07/06/2014    COPD (chronic obstructive pulmonary disease) (CMS HCC)     Fistula     HTN (hypertension)     Hyperlipidemia     Lung mass     with pleural effusion    Obesity     Sleep apnea     Ventral hernia          Past Surgical History:   Procedure Laterality Date    ABSCESS DRAINAGE      BOWEL RESECTION      CATARACT EXTRACTION      HX CERVICAL CONE BIOPSY       HX CESAREAN SECTION      HX GASTRIC BYPASS      25 years ago    [place] (x2)    HX HYSTERECTOMY  late 6s    HX LAP BANDING      2013    HX LAPAROTOMY      2013 to remove lap band         Family Medical History:     Problem Relation (Age of Onset)    Diabetes Mother, Maternal Grandmother, Maternal Grandfather, Father, Sister    Heart Attack Mother    Stroke Father            No current outpatient medications on file.       PHYSICAL EXAMINATION:  Vitals:    12/12/19 0400 12/12/19 0515 12/12/19 0600 12/12/19 0800   BP:  120/69  (!) 123/57   Pulse:  78  86   Resp:  18  20   Temp:  36 C (96.8 F)  36.7 C (98.1 F)   SpO2: 99% 98% 98% 97%   Weight:       Height:       BMI:               EXAM:   Consitutional: No acute distress.    Eyes: EOMI. No discharge. No Jaundice.   ENT: mucous membranes dry. No posterior pharynx lesions.. Neck supple, No palpable masses   Heme/ Lymph: No Cervical, Inguinal, axillary lymh nodes. No Bruising.   Cardiovascular: S1, S2, No murmurs, rubs, or gallops.   Respiratory: Clear to auscultate and percuss B/L, b/l wheezing   Abdomen: Normal Bowel Sounds,  nontender nondistended. No hepatosplenomegaly.   Musculoskeletal: No Edema to the extremities.   Skin: Normal turgor. No Rashes,skin lesions   Psychiatry: Normal Affect   Neuro: No focal deficits. Alert and Oriented x 3      ENCOUNTER ORDERS:  Orders Placed This Encounter    ADULT ROUTINE BLOOD CULTURE, SET OF 2 BOTTLES (BACTERIA AND YEAST)    ADULT ROUTINE BLOOD CULTURE, SET OF 2 BOTTLES (BACTERIA AND YEAST)    CBC/DIFF    BASIC METABOLIC PANEL    TROPONIN-I    CBC WITH DIFF    LACTIC ACID LEVEL    VENOUS BLOOD GAS    COVID-19 SCREENING INCLUDING FLU A/B, RSV RAPID BY PCR    CBC/DIFF    BASIC METABOLIC PANEL, NON-FASTING    MAGNESIUM    PHOSPHORUS    PT/INR    CBC WITH DIFF    CBC/DIFF - AM ONCE    BASIC METABOLIC PANEL - AM ONCE    MAGNESIUM - AM ONCE    CBC WITH DIFF    OXYGEN - NASAL CANNULA    ECG 12 LEAD - ED USE    INSERT & MAINTAIN PERIPHERAL IV ACCESS    PERIPHERAL IV DRESSING CHANGE    PATIENT CLASS/LEVEL OF CARE DESIGNATION - [place]    ampicillin-sulbactam (UNASYN) 3 g in NS 100 mL IVPB minibag    levothyroxine (SYNTHROID) tablet    pantoprazole (PROTONIX) delayed release tablet    oxyCODONE (ROXICODONE) immediate release tablet    linaclotide (LINZESS) capsule    docusate sodium (COLACE) capsule    tiotropium bromide (SPIRIVA HANDIHALER) 18 mcg caps with inhalation device    guaiFENesin (MUCINEX) extended release tablet - for cough (expectorant)    apixaban (ELIQUIS) tablet    carvedilol (COREG) tablet    flecainide (TAMBOCOR) tablet    ondansetron (ZOFRAN) 2 mg/mL injection    prochlorperazine (COMPAZINE) tablet    loratadine (CLARITIN) tablet    atorvastatin (LIPITOR) tablet    lisinopril (PRINIVIL) tablet    budesonide-formoterol (  SYMBICORT) 160 mcg-4.5 mcg per inhalation oral inhaler    rOPINIRole (REQUIP) tablet    magnesium oxide (MAG-OX) tablet    ampicillin-sulbactam (UNASYN) 3 g in NS 100 mL IVPB minibag    doxycycline hyclate (VIBRAMYCIN) capsule    NS  flush syringe    NS flush syringe    ipratropium-albuterol 0.5 mg-3 mg(2.5 mg base)/3 mL Solution for Nebulization    acetaminophen (TYLENOL) tablet       LABORATORY/RADIOLOGICAL DATA: All pertinent labs were reviewed.     CBC  Diff   Lab Results   Component Value Date/Time    WBC 7.6 12/12/2019 06:20 AM    HGB 9.1 (L) 12/12/2019 06:20 AM    HCT 28.4 (L) 12/12/2019 06:20 AM    PLTCNT 268 12/12/2019 06:20 AM    RBC 3.89 12/12/2019 06:20 AM    MCV 72.9 (L) 12/12/2019 06:20 AM    MCHC 32.2 (L) 12/12/2019 06:20 AM    MCH 23.4 (L) 12/12/2019 06:20 AM    RDW 18.2 (H) 12/12/2019 06:20 AM    MPV 7.2 12/12/2019 06:20 AM    Lab Results   Component Value Date/Time    PMNS 76 12/12/2019 06:20 AM    LYMPHOCYTES 10 12/12/2019 06:20 AM    EOSINOPHIL 3 12/12/2019 06:20 AM    MONOCYTES 12 12/12/2019 06:20 AM    BASOPHILS 0 12/12/2019 06:20 AM    BASOPHILS 0.00 12/12/2019 06:20 AM    PMNABS 5.80 12/12/2019 06:20 AM    LYMPHSABS 0.70 (L) 12/12/2019 06:20 AM    EOSABS 0.20 12/12/2019 06:20 AM    MONOSABS 0.90 12/12/2019 06:20 AM    BASOSABS 0.10 11/10/2014 02:28 PM    BASABS 0.01 09/17/2018 08:24 AM        Comprehensive Metabolic Profile    Lab Results   Component Value Date/Time    SODIUM 139 12/12/2019 06:20 AM    POTASSIUM 4.3 12/12/2019 06:20 AM    CHLORIDE 106 12/12/2019 06:20 AM    CO2 27 12/12/2019 06:20 AM    ANIONGAP 6 12/12/2019 06:20 AM    BUN 11 12/12/2019 06:20 AM    CREATININE 0.82 12/12/2019 06:20 AM    GLUCOSE Negative 09/24/2018 10:36 AM    Lab Results   Component Value Date/Time    CALCIUM 8.1 (L) 12/12/2019 06:20 AM    PHOSPHORUS 3.9 12/11/2019 06:38 AM    ALBUMIN 3.2 12/10/2019 08:12 AM    TOTALPROTEIN 6.0 12/10/2019 08:12 AM    ALKPHOS 90 12/10/2019 08:12 AM    AST 16 12/10/2019 08:12 AM    ALT 12 12/10/2019 08:12 AM    TOTBILIRUBIN 0.6 12/10/2019 08:12 AM        Imaging:   12/10/19 Ct chest   IMPRESSION:  1. Stable right pleural effusion right right perihilar atelectatic changes  as above.  2. Worsening patchy  left mid and lower lung infiltrates concerning for  worsening pneumonia.    ASSESSMENT AND PLAN:  69 year old female with history of stage III colon cancer status post FOLFOX, now with stage 3 lung cancer status post concurrent chemoradiation and now on consolidation durvalumab, status post 17 cycles, presents with shortness of breath, cough and CT chest concerning for pneumonia    -pneumonia  Follow-up blood cultures, sputum culture  Urine Legionella/strep antigen recently tested was negative  Continue Unasyn/doxycycline  Continue DuoNebs  Follow-up ID for any change in antibiotic regimen   Will need repeat CT chest in 4-6 weeks to document resolution of pneumonia  Durvalumab  currently on hold, resume once clinically stable as outpatient    Hypothyroidism  Continue levothyroxine    Anemia  Check iron levels, vitamin B12, folate, reticulocyte  Could be anemia from underlying malignancy      Approximately 45 minutes of time was spent with the patient, of which greater than half the time was spent discussing the patient's diagnosis, differential diagnosis, treatment options and alternatives.  All questions were answered according to her satisfaction.       Hematology to follow    Thank you for the consult    Gala Lewandowsky, MD

## 2019-12-13 LAB — BASIC METABOLIC PANEL
ANION GAP: 4 mmol/L
BUN/CREA RATIO: 13
BUN: 10 mg/dL (ref 10–25)
CALCIUM: 8.4 mg/dL — ABNORMAL LOW (ref 8.8–10.3)
CHLORIDE: 109 mmol/L (ref 98–111)
CO2 TOTAL: 28 mmol/L (ref 21–35)
CREATININE: 0.75 mg/dL (ref <=1.30)
ESTIMATED GFR: >60 mL/min/{1.73_m2}
GLUCOSE: 99 mg/dL (ref 70–110)
POTASSIUM: 4.2 mmol/L (ref 3.5–5.0)
SODIUM: 141 mmol/L (ref 135–145)

## 2019-12-13 LAB — CBC WITH DIFF
BASOPHIL #: 0.1 10*3/uL (ref 0.00–0.20)
BASOPHIL %: 1 %
EOSINOPHIL #: 0.2 10*3/uL (ref 0.00–0.50)
EOSINOPHIL %: 3 %
HCT: 28.6 % — ABNORMAL LOW (ref 34.6–46.2)
HGB: 9.3 g/dL — ABNORMAL LOW (ref 11.8–15.8)
LYMPHOCYTE #: 0.7 10*3/uL — ABNORMAL LOW (ref 0.90–3.40)
LYMPHOCYTE %: 9 %
MCH: 23.6 pg — ABNORMAL LOW (ref 27.6–33.2)
MCHC: 32.4 g/dL — ABNORMAL LOW (ref 32.6–35.4)
MCV: 72.7 fL — ABNORMAL LOW (ref 82.3–96.7)
MONOCYTE #: 0.8 10*3/uL (ref 0.20–0.90)
MONOCYTE %: 11 %
MPV: 7.3 fL (ref 6.6–10.2)
NEUTROPHIL #: 5.6 10*3/uL (ref 1.50–6.40)
NEUTROPHIL %: 76 %
PLATELETS: 298 10*3/uL (ref 140–440)
RBC: 3.94 10*6/uL (ref 3.80–5.24)
RDW: 17.6 % — ABNORMAL HIGH (ref 12.4–15.2)
WBC: 7.3 10*3/uL (ref 3.5–10.3)

## 2019-12-13 LAB — MAGNESIUM: MAGNESIUM: 1.8 mg/dL (ref 1.8–2.3)

## 2019-12-13 LAB — PHOSPHORUS: PHOSPHORUS: 4 mg/dL (ref 2.5–4.5)

## 2019-12-13 LAB — AEROBIC BACTERIA, MIC - LAB USE ONLY

## 2019-12-13 NOTE — Care Plan (Signed)
Problem: Adult Inpatient Plan of Care  Goal: Plan of Care Review  Outcome: Ongoing (see interventions/notes)  Goal: Patient-Specific Goal (Individualized)  Outcome: Ongoing (see interventions/notes)  Goal: Absence of Hospital-Acquired Illness or Injury  Outcome: Ongoing (see interventions/notes)  Goal: Optimal Comfort and Wellbeing  Outcome: Ongoing (see interventions/notes)  Goal: Rounds/Family Conference  Outcome: Ongoing (see interventions/notes)     Problem: Fluid Imbalance (Pneumonia)  Goal: Fluid Balance  Outcome: Ongoing (see interventions/notes)     Problem: Infection (Pneumonia)  Goal: Resolution of Infection Signs and Symptoms  Outcome: Ongoing (see interventions/notes)     Problem: Respiratory Compromise (Pneumonia)  Goal: Effective Oxygenation and Ventilation  Outcome: Ongoing (see interventions/notes)     Problem: Breathing Pattern Ineffective  Goal: Effective Breathing Pattern  Outcome: Ongoing (see interventions/notes)     Problem: Fall Injury Risk  Goal: Absence of Fall and Fall-Related Injury  Outcome: Ongoing (see interventions/notes)     Problem: Hypertension Comorbidity  Goal: Blood Pressure in Desired Range  Outcome: Ongoing (see interventions/notes)     Problem: Health Knowledge, Opportunity to Enhance (Adult,Obstetrics,Pediatric)  Goal: Knowledgeable about Health Subject/Topic  Description: Patient will demonstrate the desired outcomes by discharge/transition of care.  Outcome: Ongoing (see interventions/notes)

## 2019-12-13 NOTE — Nurses Notes (Signed)
Patient rested well through the night. Denied any pain and states improvement in breathing with ambulation. NSR on telemetry. Tolerating IV antibiotics. VSS. Denies any concerns at this time. I have reviewed this patient's orders and plan of care.  Currently this patient meets requirements for a low to mid level of nursing care. Louie Boston, RN

## 2019-12-14 DIAGNOSIS — I10 Essential (primary) hypertension: Secondary | ICD-10-CM

## 2019-12-14 LAB — MAGNESIUM: MAGNESIUM: 1.8 mg/dL (ref 1.8–2.3)

## 2019-12-14 LAB — CBC WITH DIFF
BASOPHIL #: 0 10*3/uL (ref 0.00–0.20)
BASOPHIL %: 1 %
EOSINOPHIL #: 0.2 10*3/uL (ref 0.00–0.50)
EOSINOPHIL %: 2 %
HCT: 27.7 % — ABNORMAL LOW (ref 34.6–46.2)
HGB: 8.9 g/dL — ABNORMAL LOW (ref 11.8–15.8)
LYMPHOCYTE #: 0.9 10*3/uL (ref 0.90–3.40)
LYMPHOCYTE %: 12 %
MCH: 23.4 pg — ABNORMAL LOW (ref 27.6–33.2)
MCHC: 32.1 g/dL — ABNORMAL LOW (ref 32.6–35.4)
MCV: 72.7 fL — ABNORMAL LOW (ref 82.3–96.7)
MONOCYTE #: 0.9 10*3/uL (ref 0.20–0.90)
MONOCYTE %: 11 %
MPV: 7.4 fL (ref 6.6–10.2)
NEUTROPHIL #: 5.9 10*3/uL (ref 1.50–6.40)
NEUTROPHIL %: 75 %
PLATELETS: 289 10*3/uL (ref 140–440)
RBC: 3.81 10*6/uL (ref 3.80–5.24)
RDW: 18.6 % — ABNORMAL HIGH (ref 12.4–15.2)
WBC: 7.9 10*3/uL (ref 3.5–10.3)

## 2019-12-14 LAB — BASIC METABOLIC PANEL
ANION GAP: 6 mmol/L
BUN/CREA RATIO: 16
BUN: 12 mg/dL (ref 10–25)
CALCIUM: 8.1 mg/dL — ABNORMAL LOW (ref 8.8–10.3)
CHLORIDE: 109 mmol/L (ref 98–111)
CO2 TOTAL: 28 mmol/L (ref 21–35)
CREATININE: 0.73 mg/dL (ref <=1.30)
ESTIMATED GFR: >60 mL/min/{1.73_m2}
GLUCOSE: 89 mg/dL (ref 70–110)
POTASSIUM: 3.9 mmol/L (ref 3.5–5.0)
SODIUM: 143 mmol/L (ref 135–145)

## 2019-12-14 LAB — PHOSPHORUS: PHOSPHORUS: 4.2 mg/dL (ref 2.5–4.5)

## 2019-12-14 MED ORDER — DOXYCYCLINE HYCLATE 100 MG TABLET
100.00 mg | ORAL_TABLET | Freq: Two times a day (BID) | ORAL | 0 refills | Status: AC
Start: 2019-12-14 — End: 2019-12-17

## 2019-12-14 NOTE — Nurses Notes (Signed)
I have reviewed this patient's orders and plan of care.  Currently this patient meets requirements for a low to mid level of nursing care.    Nevaeh Casillas, RN

## 2019-12-14 NOTE — Nurses Notes (Signed)
I have reviewed this patient's orders and plan of care. Currently this patient meets requirements for a low to mid level of nursing care.   Ryatt Corsino, RN

## 2019-12-14 NOTE — Consults (Signed)
[facility]  Hematology/[place]  FOLLOW-UP CONSULT NOTE        Date of Service: 12/14/2019    Name: Kelsey [name]    Date of Admission: 12/10/2019  MRN: [id number]  Date of Birth: June 05, 1951    PRIMARY ATTENDING: Felipa Evener, MD    PRIMARY SERVICE: [facility] HOSPITALIST 1    REASON FOR CONSULT:  Pneumonia with history of lung cancer    Subjective:  No new issues overnight.  Overall feeling much better.  Denies any worsening shortness of breath or chest pain.  Wants to go home.    REVIEW OF SYSTEMS:  Other than ROS in the HPI, all other systems were negative.    Allergies:   Allergies   Allergen Reactions    Shellfish Containing Products Shortness of Breath, Rash and Itching     scallops    Vancomycin Shortness of Breath    Barium Iodide     Iodine And Iodide Containing Products      Itching, shortness of breath         PAST MEDICAL HISTORY:    Past Medical History:   Diagnosis Date    A-fib (CMS HCC)     Arthropathy, unspecified, site unspecified     Asthma     Cancer (CMS [place])     cervical    Cataract     Colon cancer (CMS [place]) 07/06/2014    COPD (chronic obstructive pulmonary disease) (CMS HCC)     Fistula     HTN (hypertension)     Hyperlipidemia     Lung mass     with pleural effusion    Obesity     Sleep apnea     Ventral hernia              PAST SURGICAL HISTORY:    Past Surgical History:   Procedure Laterality Date    ABSCESS DRAINAGE      BOWEL RESECTION      CATARACT EXTRACTION      HX CERVICAL CONE BIOPSY       HX CESAREAN SECTION      HX GASTRIC BYPASS      25 years ago    [place] (x2)    HX HYSTERECTOMY      late 73s    HX LAP BANDING      2013    [place]      2013 to remove lap band           CURRENT MEDICATIONS:  Current Outpatient Medications   Medication Sig    doxycycline 100 mg Oral Tablet Take 1 Tablet (100 mg total) by mouth Twice daily for 3 days       SOCIAL HISTORY:  Social History     Tobacco Use    Smoking status: Former Smoker       Smokeless tobacco: Never Used    Tobacco comment: quit 10 yrs ago   Vaping Use    Vaping Use: Never used   Substance Use Topics    Alcohol use: Not Currently     Comment: rarely    Drug use: No         FAMILY HISTORY:  Family Medical History:     Problem Relation (Age of Onset)    Diabetes Mother, Maternal Grandmother, Maternal Grandfather, Father, Sister    Heart Attack Mother    Stroke Father  PHYSICAL EXAMINATION:  Exam Temperature: 36.8 C (98.2 F)  Heart Rate: 74  BP (Non-Invasive): 122/64  Respiratory Rate: 16  SpO2: 100 %  Pain Score (Numeric, Faces): 0  Constitutional:  appears chronically ill  Respiratory:  Clear to auscultation bilaterally.   Cardiovascular:  regular rate and rhythm  Gastrointestinal:  Soft, non-tender  Neurologic:  Grossly normal  Lymphatic/Immunologic/Hematologic:  No lymphadenopathy      LABORATORY REPORTS:  Lab Results Today:    Results for orders placed or performed during the hospital encounter of 12/10/19 (from the past 24 hour(s))   BASIC METABOLIC PANEL   Result Value Ref Range    SODIUM 143 135 - 145 mmol/L    POTASSIUM 3.9 3.5 - 5.0 mmol/L    CHLORIDE 109 98 - 111 mmol/L    CO2 TOTAL 28 21 - 35 mmol/L    ANION GAP 6 mmol/L    CALCIUM 8.1 (L) 8.8 - 10.3 mg/dL    GLUCOSE 89 70 - 110 mg/dL    BUN 12 10 - 25 mg/dL    CREATININE 0.73 <=1.30 mg/dL    BUN/CREA RATIO 16     ESTIMATED GFR >60 Avg: 85 mL/min/1.17m2   PHOSPHORUS   Result Value Ref Range    PHOSPHORUS 4.2 2.5 - 4.5 mg/dL   MAGNESIUM   Result Value Ref Range    MAGNESIUM 1.8 1.8 - 2.3 mg/dL   CBC WITH DIFF   Result Value Ref Range    WBC 7.9 3.5 - 10.3 x103/uL    RBC 3.81 3.80 - 5.24 x106/uL    HGB 8.9 (L) 11.8 - 15.8 g/dL    HCT 27.7 (L) 34.6 - 46.2 %    MCV 72.7 (L) 82.3 - 96.7 fL    MCH 23.4 (L) 27.6 - 33.2 pg    MCHC 32.1 (L) 32.6 - 35.4 g/dL    RDW 18.6 (H) 12.4 - 15.2 %    PLATELETS 289 140 - 440 x103/uL    MPV 7.4 6.6 - 10.2 fL    NEUTROPHIL % 75 %    LYMPHOCYTE % 12 %    MONOCYTE % 11 %     EOSINOPHIL % 2 %    BASOPHIL % 1 %    NEUTROPHIL # 5.90 1.50 - 6.40 x103/uL    LYMPHOCYTE # 0.90 0.90 - 3.40 x103/uL    MONOCYTE # 0.90 0.20 - 0.90 x103/uL    EOSINOPHIL # 0.20 0.00 - 0.50 x103/uL    BASOPHIL # 0.00 0.00 - 0.20 x103/uL         IMAGING STUDIES:    Reviewed:  No orders to display             IMPRESSION:  69 y.o. female with:    1. Pneumonia.  2. Non-small cell lung cancer.      PLAN/RECOMMENDATIONS:  69 y.o. female with stage III lung cancer currently on maintenance treatment with durvalumab admitted to hospital with pneumonia.    Pneumonia:  1. Treated with IV antibiotics.  2. Overall feeling better.  Wants to go home.      Non-small cell lung cancer:  1. Status post chemoradiation.  2. Currently on maintenance durvalumab.  3. Will make an appointment as outpatient for treatment.        Vanetta Mulders, MD     cc:  Felipa Evener, MD

## 2019-12-14 NOTE — Nurses Notes (Signed)
Patient is alert and oriented. Vitals are stable. Patient complained of pain once during the shift, given prn pain medicine. Patient had no other complaints during the shift. Patient slept most of the night. Patient remained free from falls. Floydene Flock, LPN

## 2019-12-14 NOTE — Care Plan (Signed)
Problem: Adult Inpatient Plan of Care  Goal: Plan of Care Review  Outcome: Ongoing (see interventions/notes)  Goal: Patient-Specific Goal (Individualized)  Outcome: Ongoing (see interventions/notes)  Goal: Absence of Hospital-Acquired Illness or Injury  Outcome: Ongoing (see interventions/notes)  Goal: Optimal Comfort and Wellbeing  Outcome: Ongoing (see interventions/notes)  Goal: Rounds/Family Conference  Outcome: Ongoing (see interventions/notes)     Problem: Fluid Imbalance (Pneumonia)  Goal: Fluid Balance  Outcome: Ongoing (see interventions/notes)     Problem: Infection (Pneumonia)  Goal: Resolution of Infection Signs and Symptoms  Outcome: Ongoing (see interventions/notes)     Problem: Respiratory Compromise (Pneumonia)  Goal: Effective Oxygenation and Ventilation  Outcome: Ongoing (see interventions/notes)     Problem: Breathing Pattern Ineffective  Goal: Effective Breathing Pattern  Outcome: Ongoing (see interventions/notes)     Problem: Fall Injury Risk  Goal: Absence of Fall and Fall-Related Injury  Outcome: Ongoing (see interventions/notes)     Problem: Fever (Fever with Neutropenia)  Goal: Baseline Body Temperature  Outcome: Ongoing (see interventions/notes)     Problem: Infection Risk (Fever with Neutropenia)  Goal: Absence of Infection  Outcome: Ongoing (see interventions/notes)     Problem: Asthma Comorbidity  Goal: Maintenance of Asthma Control  Outcome: Ongoing (see interventions/notes)     Problem: Hypertension Comorbidity  Goal: Blood Pressure in Desired Range  Outcome: Ongoing (see interventions/notes)     Problem: Obstructive Sleep Apnea Risk or Actual (Comorbidity Management)  Goal: Unobstructed Breathing During Sleep  Outcome: Ongoing (see interventions/notes)     Problem: Health Knowledge, Opportunity to Enhance (Adult,Obstetrics,Pediatric)  Goal: Knowledgeable about Health Subject/Topic  Description: Patient will demonstrate the desired outcomes by discharge/transition of care.  Outcome:  Ongoing (see interventions/notes)

## 2019-12-14 NOTE — Nurses Notes (Signed)
Patient discharged home with family.  AVS reviewed with patient/care giver.  A written copy of the AVS and discharge instructions was given to the patient/care giver.  Questions sufficiently answered as needed.  Patient/care giver encouraged to follow up with PCP as indicated.  In the event of an emergency, patient/care giver instructed to call 911 or go to the nearest emergency room.  Right chest port de-accessed by charge RN. Sarina Ser, LPN

## 2019-12-14 NOTE — Discharge Summary (Signed)
[place]      PATIENT NAME:  Kelsey [name], Kelsey [name]  MRN:  [id number]  DOB:  1951-07-20      ENCOUNTER START DATE: 11/30/2019  INPATIENT ADMISSION DATE: 11/30/2019    DISCHARGE DATE:  12/03/2019    ATTENDING PHYSICIAN: Felipa Evener, MD    PRIMARY CARE PHYSICIAN: [facility]     ADMISSION DIAGNOSIS: <principal problem not specified>  Chief Complaint   Patient presents with    Shortness of Breath     C/O Worsening SOB x 1 month           DISCHARGE DIAGNOSIS:     Pneumonia, community acquired due to Klebsiella pneumonia/haemophilus  Lung Cancer  COPD, acute exacerbation - ruled out.  Paroxysmal Atrial Fibrillation  Hypertension  Hyperlipidemia  Hypothyroidism  BMI of 45      DISCHARGE MEDICATIONS:     Current Discharge Medication List      START taking these medications.      Details   guaiFENesin 600 mg Tablet Extended Release 12hr  Commonly known as: Mucinex   1,200 mg, Oral, 2 TIMES DAILY  Qty: 360 Tablet  Refills: 3     tiotropium bromide 18 mcg Capsule, w/Inhalation Device  Commonly known as: SPIRIVA HANDIHALER   18 mcg, Inhalation, DAILY  Qty: 30 Capsule  Refills: 0     umeclidinium 62.5 mcg/actuation Disk with Device  Commonly known as: INCRUSE ELLIPTA   1 INHALATION, Inhalation, DAILY  Qty: 3 Each  Refills: 4        CONTINUE these medications - NO CHANGES were made during your visit.      Details   * albuterol sulfate 2.5 mg /3 mL (0.083 %) Solution for Nebulization  Commonly known as: PROVENTIL   2.5 mg, Nebulization, EVERY 6 HOURS  Qty: 30 Each  Refills: 3     * albuterol sulfate 90 mcg/actuation HFA Aerosol Inhaler  Commonly known as: PROVENTIL or VENTOLIN or PROAIR   INHALE 1 TO 2 PUFFS BY MOUTH EVERY 6 HOURS AS NEEDED  Qty: 25.5 g  Refills: 0     apixaban 5 mg Tablet  Commonly known as: ELIQUIS   5 mg, Oral, 2 TIMES DAILY  Refills: 0     atorvastatin 10 mg Tablet  Commonly known as: LIPITOR   10 mg, Oral, DAILY  Refills: 0     budesonide-formoteroL 160-4.5 mcg/actuation  HFA Aerosol Inhaler  Commonly known as: SYMBICORT   2 Puffs, Inhalation, 2 TIMES DAILY  Qty: 1 Inhaler  Refills: 0     carvediloL 3.125 mg Tablet  Commonly known as: COREG   3.125 mg, Oral, 2 TIMES DAILY WITH FOOD  Refills: 0     docusate sodium 100 mg Capsule  Commonly known as: COLACE   100 mg, Oral, 3 TIMES DAILY PRN  Refills: 0     flecainide 100 mg Tablet  Commonly known as: TAMBOCOR   100 mg, Oral, 2 TIMES DAILY  Qty: 180 Tab  Refills: 3     levothyroxine 112 mcg Tablet  Commonly known as: SYNTHROID   112 mcg, Oral, EVERY MORNING  Qty: 90 Tablet  Refills: 4     Linzess 72 mcg Capsule  Generic drug: linaCLOtide   72 mcg, Oral, EVERY MORNING  Refills: 0     lisinopriL 20 mg Tablet  Commonly known as: PRINIVIL   20 mg, Oral, DAILY  Refills: 0     loperamide 2 mg Capsule  Commonly  known as: IMODIUM   2 mg, Oral, EVERY 4 HOURS PRN  Refills: 0     loratadine 10 mg Tablet  Commonly known as: CLARITIN   TAKE 1 TABLET BY MOUTH EVERY DAY  Qty: 90 Tablet  Refills: 0     magnesium oxide 400 mg Tablet  Commonly known as: MAG-OX   400 mg, Oral, DAILY  Refills: 0     omeprazole 20 mg Capsule, Delayed Release(E.C.)  Commonly known as: PRILOSEC   40 mg, Oral, DAILY  Refills: 0     oxyCODONE 5 mg Tablet  Commonly known as: ROXICODONE   5 mg, Oral, EVERY 6 HOURS PRN  Qty: 120 Tablet  Refills: 0     potassium chloride 20 mEq Tab Sust.Rel. Particle/Crystal  Commonly known as: K-DUR   20 mEq, Oral, DAILY, Takes 3 days a week  Refills: 0     prochlorperazine 10 mg Tablet  Commonly known as: Compazine   10 mg, Oral, 4 TIMES DAILY PRN  Qty: 60 Tab  Refills: 5     rOPINIRole 0.5 mg Tablet  Commonly known as: REQUIP   0.5 mg, Oral, NIGHTLY  Refills: 0         * This list has 2 medication(s) that are the same as other medications prescribed for you. Read the directions carefully, and ask your doctor or other care provider to review them with you.            STOP taking these medications.    fluticasone propionate 50 mcg/actuation Spray,  Suspension  Commonly known as: FLONASE        ASK your doctor about these medications.      Details   amoxicillin-pot clavulanate 875-125 mg Tablet  Commonly known as: AUGMENTIN  Ask about: Should I take this medication?   1 Tablet, Oral, 2 TIMES DAILY  Qty: 8 Tablet  Refills: 0            DISCHARGE INSTRUCTIONS:      FOLLOW-UP: [place], [place]     Follow-up in: [place]    Reason for visit: HOSPITAL DISCHARGE      DME - HOME OXYGEN    Study used to evaluate oxygen saturation at rest vs exercise was performed during an inpatient hospital stay within 2 days prior to discharge date and was the last test performed prior to discharge and met the following criteria:   Study performed at rest without oxygen.  Study performed during exercise without oxygen.  Study performed during exercise with oxygen applied that demonstrates improvement.    Results of oxygen trials:                              Ht 157.5 cm    Wt 112.72 kg    Current Attending: Felipa Evener    I certify this pt is under my care as an attending physician & that I, or a collaborating ANP/PA had a face to face encounter with the pt or the durable medical power of attorney, as appropriate and explained the need for DME on the following date: 12/03/2019    The primary diagnosis as discussed in the face to face encounter justifying the need for home health services/DME is as follows: COPD    Liter flow per minute 2L    RA O2 Sats at rest: 95    RA O2 Sats at Rest Date:  12/03/2019    RA O2 Sats at Rest Time: 1:61 PM    I certify that home oxygen is necessary due to the following condition At rest, O2 sat is >89% on room air but during exercise O2 sat is <88% and O2 administration improves the hypoxemia    Physician has determined that patient has a severe lung disease or hypoxia related symptoms that will improve with oxygen Yes    Start Date 12/03/2019    Type of Concentrator PORTABLE OXYGEN    Type of Concentrator STATIONARY  CONCENTRATOR    Type of Concentrator OXYGEN CONSERVING DEVICE    RA O2 sats with ambulation (if sats 89% or greater @ rest) 88    O2 sats with ambulation AND oxygen at liter flow (if sats 89% or greater @ rest): 94 2L   Oxygen use will be continuous? Yes    Patient is mobile within the home and will require portable oxygen: Yes    Estimated Length of Need (in months; 99 mo.= lifetime) 99    Delivery Device NASAL CANNULA      Follow-up Information     Pulmonary, [facility] .    Specialty: Pulmonary  Contact information:  [place] [address]  [phone]  Additional information:  Below are driving directions to the Pulmonary clinic, located in the [facility], in [place], [place]. If you need any additional information, please call [phone]. You may also visit our website at [email or web address]*Traveling south on [address], take [address], turn right onto [address] then turn left onto [address] at the stop light. Visitor parking lot is adjacent to the front of the hospital. Continue straight ahead, follow signs to [employer] on [address], take exit [address], turn left onto [address] then turn left onto [address] at the second stop light. Continue straight ahead, follow signs to [facility].                 REASON FOR HOSPITALIZATION AND HOSPITAL COURSE:  This is a 69 y.o., female who presented with shortness of breath worsening form baseline. Patient does have history of lung cancer. On admission, she was started on Unasyn and azithromycin.  Symptomatically, patient improved.  She was evaluated by oncology as well as pulmonary. She was discharged home in stable condition.      COURSE IN HOSPITAL: STable for discharge.    DOES PATIENT HAVE ADVANCED DIRECTIVES:  Yes, Patient Does Have Advance Directive for [place] - Not applicable for this  patient    CONDITION ON DISCHARGE: Alert, Oriented and VS Stable    DISCHARGE DISPOSITION:  Home discharge     Copies sent to Care Team       Relationship Specialty Notifications Start End    Center, [place] PCP - General EXTERNAL  10/01/17     Phone: [phone] Fax: [phone] [place] [address]    [place], [place], [place] [facility]  12/19/18     Phone: [phone] Fax: [phone]         [place] [address]    Newman Nip, RN    12/19/18     Johnanna Schneiders, MD  RADIATION ONCOLOGY  12/19/18     Phone: [phone] Fax: [phone]         [place]  [place] [address]    Herbie Drape, NP  NURSE PRACTITIONER  12/19/18     Phone: [phone] Fax: [phone]         [place] [address]    Phineas Inches, FNP-BC  HEMATOLOGY/ONCOLOGY  12/19/18     Phone: [phone] Fax: [phone]         [place] [address]    Clair Gulling, MD  THORACIC SURGERY ([place])  12/19/18     Phone: [phone] Fax: [phone]         [address] [place] [address]            Felipa Evener, MD

## 2019-12-14 NOTE — Care Plan (Signed)
Problem: Adult Inpatient Plan of Care  Goal: Plan of Care Review  Outcome: Ongoing (see interventions/notes)  Goal: Patient-Specific Goal (Individualized)  Outcome: Ongoing (see interventions/notes)  Goal: Absence of Hospital-Acquired Illness or Injury  Outcome: Ongoing (see interventions/notes)  Goal: Optimal Comfort and Wellbeing  Outcome: Ongoing (see interventions/notes)  Goal: Rounds/Family Conference  Outcome: Ongoing (see interventions/notes)     Problem: Fluid Imbalance (Pneumonia)  Goal: Fluid Balance  Outcome: Ongoing (see interventions/notes)     Problem: Infection (Pneumonia)  Goal: Resolution of Infection Signs and Symptoms  Outcome: Ongoing (see interventions/notes)     Problem: Respiratory Compromise (Pneumonia)  Goal: Effective Oxygenation and Ventilation  Outcome: Ongoing (see interventions/notes)     Problem: Breathing Pattern Ineffective  Goal: Effective Breathing Pattern  Outcome: Ongoing (see interventions/notes)     Problem: Fall Injury Risk  Goal: Absence of Fall and Fall-Related Injury  Outcome: Ongoing (see interventions/notes)     Problem: Fever (Fever with Neutropenia)  Goal: Baseline Body Temperature  Outcome: Ongoing (see interventions/notes)     Problem: Infection Risk (Fever with Neutropenia)  Goal: Absence of Infection  Outcome: Ongoing (see interventions/notes)     Problem: Asthma Comorbidity  Goal: Maintenance of Asthma Control  Outcome: Ongoing (see interventions/notes)     Problem: Hypertension Comorbidity  Goal: Blood Pressure in Desired Range  Outcome: Ongoing (see interventions/notes)     Problem: Obstructive Sleep Apnea Risk or Actual (Comorbidity Management)  Goal: Unobstructed Breathing During Sleep  Outcome: Ongoing (see interventions/notes)     Problem: Health Knowledge, Opportunity to Enhance (Adult,Obstetrics,Pediatric)  Goal: Knowledgeable about Health Subject/Topic  Description: Patient will demonstrate the desired outcomes by discharge/transition of care.  Outcome:  Ongoing (see interventions/notes)   Floydene Flock, LPN

## 2019-12-15 LAB — ADULT ROUTINE BLOOD CULTURE, SET OF 2 BOTTLES (BACTERIA AND YEAST)
BLOOD CULTURE, ROUTINE: NO GROWTH
BLOOD CULTURE, ROUTINE: NO GROWTH

## 2019-12-15 NOTE — Discharge Summary (Addendum)
[place]      PATIENT NAME:  Kelsey [name], Kelsey [name]  MRN:  [id number]  DOB:  [birth year]      ENCOUNTER START DATE: 12/10/2019  INPATIENT ADMISSION DATE: 12/10/2019  DISCHARGE DATE:  12/14/19    ATTENDING PHYSICIAN: Felipa Evener, MD    PRIMARY CARE PHYSICIAN: [facility]     ADMISSION DIAGNOSIS: <principal problem not specified>  Chief Complaint   Patient presents with    Shortness of Breath     sent by pcp for admission  hx of pneumonia with no improvment  fever,body aches           DISCHARGE DIAGNOSIS:     Left lung pneumonia, Community Acquired, possible gram negative rod  Stage III Adenocarcinoma of Lung, currently on chemotherapy  Remote history of Colon Cancer  Paroxysmal Afib  COPD  Hypertension        DISCHARGE MEDICATIONS:     Current Discharge Medication List      START taking these medications.      Details   doxycycline 100 mg Tablet   100 mg, Oral, 2 TIMES DAILY  Qty: 6 Tablet  Refills: 0        CONTINUE these medications - NO CHANGES were made during your visit.      Details   * albuterol sulfate 2.5 mg /3 mL (0.083 %) Solution for Nebulization  Commonly known as: PROVENTIL   2.5 mg, Nebulization, EVERY 6 HOURS  Qty: 30 Each  Refills: 3     * albuterol sulfate 90 mcg/actuation HFA Aerosol Inhaler  Commonly known as: PROVENTIL or VENTOLIN or PROAIR   INHALE 1 TO 2 PUFFS BY MOUTH EVERY 6 HOURS AS NEEDED  Qty: 25.5 g  Refills: 0     apixaban 5 mg Tablet  Commonly known as: ELIQUIS   5 mg, Oral, 2 TIMES DAILY  Refills: 0     atorvastatin 10 mg Tablet  Commonly known as: LIPITOR   10 mg, Oral, DAILY  Refills: 0     Breztri Aerosphere 160-9-4.8 mcg/actuation HFA Aerosol Inhaler  Generic drug: budesonide-glycopyr-formoterol   INHALE 2 PUFFS BY MOUTH TWICE DAILY  Refills: 0     carvediloL 3.125 mg Tablet  Commonly known as: COREG   3.125 mg, Oral, 2 TIMES DAILY WITH FOOD  Refills: 0     docusate sodium 100 mg Capsule  Commonly known as: COLACE   100 mg, Oral, 3 TIMES DAILY  PRN  Refills: 0     flecainide 100 mg Tablet  Commonly known as: TAMBOCOR   100 mg, Oral, 2 TIMES DAILY  Qty: 180 Tab  Refills: 3     guaiFENesin 600 mg Tablet Extended Release 12hr  Commonly known as: Mucinex   1,200 mg, Oral, 2 TIMES DAILY  Qty: 360 Tablet  Refills: 3     levothyroxine 112 mcg Tablet  Commonly known as: SYNTHROID   112 mcg, Oral, EVERY MORNING  Qty: 90 Tablet  Refills: 4     Linzess 72 mcg Capsule  Generic drug: linaCLOtide   72 mcg, Oral, EVERY MORNING  Refills: 0     lisinopriL 20 mg Tablet  Commonly known as: PRINIVIL   20 mg, Oral, DAILY  Refills: 0     loperamide 2 mg Capsule  Commonly known as: IMODIUM   2 mg, Oral, EVERY 4 HOURS PRN  Refills: 0     loratadine 10 mg Tablet  Commonly known as: CLARITIN  TAKE 1 TABLET BY MOUTH EVERY DAY  Qty: 90 Tablet  Refills: 0     magnesium oxide 400 mg Tablet  Commonly known as: MAG-OX   400 mg, Oral, DAILY  Refills: 0     omeprazole 20 mg Capsule, Delayed Release(E.C.)  Commonly known as: PRILOSEC   40 mg, Oral, DAILY  Refills: 0     oxyCODONE 5 mg Tablet  Commonly known as: ROXICODONE   5 mg, Oral, EVERY 6 HOURS PRN  Qty: 120 Tablet  Refills: 0     potassium chloride 20 mEq Tab Sust.Rel. Particle/Crystal  Commonly known as: K-DUR   20 mEq, Oral, DAILY, Takes 3 days a week  Refills: 0     prochlorperazine 10 mg Tablet  Commonly known as: Compazine   10 mg, Oral, 4 TIMES DAILY PRN  Qty: 60 Tab  Refills: 5     rOPINIRole 0.5 mg Tablet  Commonly known as: REQUIP   0.5 mg, Oral, NIGHTLY  Refills: 0     tiotropium bromide 18 mcg Capsule, w/Inhalation Device  Commonly known as: SPIRIVA HANDIHALER   18 mcg, Inhalation, DAILY  Qty: 30 Capsule  Refills: 0     umeclidinium 62.5 mcg/actuation Disk with Device  Commonly known as: INCRUSE ELLIPTA   1 INHALATION, Inhalation, DAILY  Qty: 3 Each  Refills: 4         * This list has 2 medication(s) that are the same as other medications prescribed for you. Read the directions carefully, and ask your doctor or other care  provider to review them with you.            STOP taking these medications.    budesonide-formoteroL 160-4.5 mcg/actuation HFA Aerosol Inhaler  Commonly known as: SYMBICORT            DISCHARGE INSTRUCTIONS:   No discharge procedures on file.      REASON FOR HOSPITALIZATION AND HOSPITAL COURSE:  This is a 69 y.o., female who presented with shortness or breath.  She had recently been admitted for pneumonia and had been discharged with a course of antibiotics at that time.  Imaging performed emergency room showed worsening pneumonia.  She was admitted started on Unasyn as well as doxycycline.  She was given bronchodilators and Mucinex as well.  Oncology followed the patient. Clinically patient did improve and was stable for discharge on 12/14/2019.    Alert and oriented x3.  No acute distress.  Pleasant.  Mood affect and speech were all normal.  Pupils are equal reactive to light.  Extraocular movements intact.  Regular rate, no murmurs.  Lungs clear to auscultation.  Good breath sounds.  Abdomen is soft, nontender, nondistended.  Extremities are warm.  Good pulses.  No edema.  Skin has no rashes.  No jaundice.  Cranial nerves 2-12 grossly intact.    COURSE IN HOSPITAL: Stable for discharge.    DOES PATIENT HAVE ADVANCED DIRECTIVES:  Yes, Patient Does Have Advance Directive for [place] - Not applicable for this patient    CONDITION ON DISCHARGE: Alert, Oriented and VS Stable    DISCHARGE DISPOSITION:  Home discharge     Copies sent to Care Team       Relationship Specialty Notifications [place], [place] PCP - General EXTERNAL  10/01/17     Phone: [phone] Fax: [phone] [place] [address]  Vanetta Mulders, [place] [facility]  12/19/18     Phone: [phone] Fax: [phone] [facility] [address]    Newman Nip, RN    12/19/18     Johnanna Schneiders, MD  RADIATION ONCOLOGY  12/19/18     Phone:  [phone] Fax: [phone]         [address] [place] [address]    Herbie Drape, NP  NURSE PRACTITIONER  12/19/18     Phone: [phone] Fax: [phone]         [place] [facility] [address]    Phineas Inches, FNP-BC  HEMATOLOGY/ONCOLOGY  12/19/18     Phone: [phone] Fax: [phone]         [address] [place] [address]    Clair Gulling, MD  THORACIC SURGERY ([place])  12/19/18     Phone: [phone] Fax: [phone]         [place]  [place] [address]        Total discharge time was 35 minutes.    Felipa Evener, MD

## 2020-01-08 ENCOUNTER — Other Ambulatory Visit (HOSPITAL_COMMUNITY): Payer: Self-pay | Admitting: Internal Medicine

## 2020-01-08 MED ORDER — OXYCODONE 5 MG TABLET
5.00 mg | ORAL_TABLET | Freq: Four times a day (QID) | ORAL | 0 refills | Status: AC | PRN
Start: 2020-01-08 — End: ?

## 2020-01-08 NOTE — Telephone Encounter (Signed)
Opened in error.  Kelsey [name], [place]

## 2020-01-22 ENCOUNTER — Ambulatory Visit (HOSPITAL_BASED_OUTPATIENT_CLINIC_OR_DEPARTMENT_OTHER): Payer: Medicare Other | Admitting: Internal Medicine

## 2020-01-22 ENCOUNTER — Encounter (HOSPITAL_COMMUNITY): Payer: Self-pay

## 2020-01-22 ENCOUNTER — Ambulatory Visit (HOSPITAL_COMMUNITY)
Admission: RE | Admit: 2020-01-22 | Discharge: 2020-01-22 | Disposition: A | Discharge status: Home / Self Care | Payer: Medicare Other | Source: Ambulatory Visit

## 2020-01-22 ENCOUNTER — Inpatient Hospital Stay
Admission: RE | Admit: 2020-01-22 | Discharge: 2020-01-22 | Disposition: A | Discharge status: Still In Hospital | Payer: Medicare Other | Source: Ambulatory Visit | Attending: Internal Medicine | Admitting: Internal Medicine

## 2020-01-22 ENCOUNTER — Encounter (HOSPITAL_COMMUNITY): Payer: Self-pay | Admitting: Internal Medicine

## 2020-01-22 ENCOUNTER — Other Ambulatory Visit: Payer: Self-pay

## 2020-01-22 VITALS — BP 107/65 | HR 85 | Temp 96.4°F | Resp 18 | Ht 62.0 in | Wt 251.3 lb

## 2020-01-22 VITALS — BP 107/67 | HR 85 | Temp 96.5°F | Ht 62.0 in | Wt 250.5 lb

## 2020-01-22 DIAGNOSIS — C3490 Malignant neoplasm of unspecified part of unspecified bronchus or lung: Secondary | ICD-10-CM | POA: Insufficient documentation

## 2020-01-22 DIAGNOSIS — R918 Other nonspecific abnormal finding of lung field: Secondary | ICD-10-CM | POA: Insufficient documentation

## 2020-01-22 DIAGNOSIS — Z5111 Encounter for antineoplastic chemotherapy: Secondary | ICD-10-CM | POA: Insufficient documentation

## 2020-01-22 DIAGNOSIS — Z5112 Encounter for antineoplastic immunotherapy: Secondary | ICD-10-CM | POA: Insufficient documentation

## 2020-01-22 DIAGNOSIS — D8989 Other specified disorders involving the immune mechanism, not elsewhere classified: Secondary | ICD-10-CM | POA: Insufficient documentation

## 2020-01-22 LAB — COMPREHENSIVE METABOLIC PANEL, NON-FASTING
ALBUMIN: 3.5 g/dL (ref 3.2–4.6)
ALKALINE PHOSPHATASE: 76 U/L (ref 20–130)
ALT (SGPT): 21 U/L (ref <=52)
ANION GAP: 7 mmol/L
AST (SGOT): 32 U/L (ref <=35)
BILIRUBIN TOTAL: 0.6 mg/dL (ref 0.3–1.2)
BUN/CREA RATIO: 13
BUN: 10 mg/dL (ref 10–25)
CALCIUM: 8.5 mg/dL — ABNORMAL LOW (ref 8.8–10.3)
CHLORIDE: 108 mmol/L (ref 98–111)
CO2 TOTAL: 25 mmol/L (ref 21–35)
CREATININE: 0.78 mg/dL (ref <=1.30)
ESTIMATED GFR: >60 mL/min/{1.73_m2}
GLUCOSE: 110 mg/dL (ref 70–110)
POTASSIUM: 4.1 mmol/L (ref 3.5–5.0)
PROTEIN TOTAL: 6.1 g/dL (ref 6.0–8.3)
SODIUM: 140 mmol/L (ref 135–145)

## 2020-01-22 LAB — CBC WITH DIFF
BASOPHIL #: 0.1 10*3/uL (ref 0.00–0.20)
BASOPHIL %: 1 %
EOSINOPHIL #: 0.2 10*3/uL (ref 0.00–0.50)
EOSINOPHIL %: 2 %
HCT: 36.2 % (ref 34.6–46.2)
HGB: 11.5 g/dL — ABNORMAL LOW (ref 11.8–15.8)
LYMPHOCYTE #: 0.8 10*3/uL — ABNORMAL LOW (ref 0.90–3.40)
LYMPHOCYTE %: 10 %
MCH: 22.3 pg — ABNORMAL LOW (ref 27.6–33.2)
MCHC: 31.8 g/dL — ABNORMAL LOW (ref 32.6–35.4)
MCV: 70.3 fL — ABNORMAL LOW (ref 82.3–96.7)
MONOCYTE #: 0.9 10*3/uL (ref 0.20–0.90)
MONOCYTE %: 12 %
MPV: 8.4 fL (ref 6.6–10.2)
NEUTROPHIL #: 5.8 10*3/uL (ref 1.50–6.40)
NEUTROPHIL %: 75 %
PLATELETS: 255 10*3/uL (ref 140–440)
RBC: 5.16 10*6/uL (ref 3.80–5.24)
RDW: 20 % — ABNORMAL HIGH (ref 12.4–15.2)
WBC: 7.8 10*3/uL (ref 3.5–10.3)

## 2020-01-22 LAB — MAGNESIUM: MAGNESIUM: 1.9 mg/dL (ref 1.8–2.3)

## 2020-01-22 LAB — THYROID STIMULATING HORMONE WITH FREE T4 REFLEX: TSH: 2.31 u[IU]/mL (ref 0.450–5.330)

## 2020-01-22 MED ORDER — SODIUM CHLORIDE 0.9 % INTRAVENOUS SOLUTION
10.0000 mg/kg | Freq: Once | INTRAVENOUS | Status: AC
Start: 2020-01-22 — End: 2020-01-22
  Administered 2020-01-22: 1140 mg via INTRAVENOUS
  Administered 2020-01-22: 0 mg via INTRAVENOUS
  Filled 2020-01-22: qty 22.8

## 2020-01-22 NOTE — Nurses Notes (Addendum)
1217 left chest port flushed with 10 ml NS, +BR. Imfinzi started to infuse over one hour. Secure chatted with Omer Jack, NP  About does pt need a TSH test Tobias Alexander, RN  Jordan answered yes she does. Test ordered.  I will call pt with results. Tobias Alexander, RN  1323 Imfinzi complete. Port flushed with 45ml NS, +BR and heparinized with 32ml of Heparin. Port deaccessed and dressed with Band-Aid. Pt left via wheel chair. Tobias Alexander, RN  [phone] called pt about TSH of 2.310  Which in within normal limits. Pt stated she understood. Tobias Alexander, RN

## 2020-01-22 NOTE — Progress Notes (Signed)
[place]  [place] [id number]        Encounter Date: 01/22/2020  11:15 AM EDT    Name:  Kelsey [name]  Age: 69 y.o.  DOB: 1950/12/26  Sex: female  PCP: [facility]    Chief Complaint:    Chief Complaint   Patient presents with   . Lung Cancer     HISTORY OF PRESENT ILLNESS:   68 y.o. female with history of stage III colon cancer of present evaluation and management of lung cancer.    1. September 2015. Patient underwent sigmoidoscopy and was found to have polyp which was adenocarcinoma.    2. June 10, 2014. She was admitted for segmental resection of sigmoid colon. This revealed pT3, N1a, MX, low-grade  adenocarcinoma of the colon. The mass measuring 4 x 3 x 0.5 centimeters. It  was low-grade and there was no evidence of microsatellite instability by  histology. Margins were uninvolved and 4 lymph nodes only were removed which  only 1 was involved. This corresponding stage IIIB, T3, N1a, M0, low-grade  sigmoid cancer.     3. July 07, 2014. Recommended adjuvant chemotherapy with FOLFOX.    4. July 27, 2014. Patient was started on adjuvant chemotherapy with FOLFOX.  Patient received only 5 treatments and chemotherapy was stopped due to poor tolerance.  After that patient lost follow-up.      5. October 2019. Patient was admitted to hospital with concern for  bowel obstruction.  CT chest showed lung nodules.  Patient was managed conservatively and discharged home.    6. May 06, 2018.  Patient underwent CT-guided biopsy of lung lesion.  Pathology showed well-differentiated adenocarcinoma.  Section shows well-differentiated adenocarcinoma which positive for CK 7 and TTF 1, negative for P 63 and CK 20.     7. June 23, 2018. PET scan showed Hypermetabolic right lower lobe pulmonary mass compatible with malignancy.  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 Hepatomegaly and  steatosis.     8. July 23, 2018. Patient was started on chemoradiation.    9. October 30, 2018. PET scan showed Hypermetabolic right lower lobe mass with abnormal mediastinal and hilar nodes. There has been only mild improvement since earlier exam.     10. October 31, 2018. Patient was started on maintenance immunotherapy with durvalumab.    11. February 17, 2019. CT chest revealed Moderate right effusion new from 12/19/2018. No change in the right lower lung mass or a presumed postradiation changes in the right upper lung when compared to 12/19/2018. Mass in the right cardiophrenic angle increased from 10/30/2019 6). Consider an enlarged lymph node here. This is unchanged from 12/19/2018 but increased since size from 10/30/2018. Another possibility is an enlarging pericardial cyst (this region was not hypermetabolic 1/74/9449). Mild enlargement of the left adrenal gland stable from 10/30/2018. Approximate 9 cm upper abdominal ventral hernia containing nonobstructed bowel loops unchanged    12. March 06, 2019. PET CT revealed 1. The RIGHT lower lobe mass has decreased in size and uptake, which is consistent with response to therapy. However, abnormal uptake remains. This is consistent with residual tumor. Suspect post radiation change in the RIGHT pneumothorax. Consolidation in the medial  RIGHT lung has tumor level uptake. Differential: Post radiation change, infection, and tumor infiltration. Additional findings are concerning for tumor infiltration. Follow-up recommended. Increased RIGHT hilar density and uptake. Concerning for tumor spread. Post radiation change less likely. 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 A low-attenuation mass in the RIGHT cardiophrenic angle may be related. Lymph node metastasis less likely.    13. October 29, 2019. CT chest revealed 1. Persistent dense consolidation in the RIGHT middle lobe. Compatible with post radiation change. Residual tumor cannot be excluded. 2. Scattered groundglass opacities  in both lungs. Favor post radiation change or infiltrate or tumor. Follow-up recommended. 3. A 5 mm nodule in the LEFT lingula is concerning for metastatic disease. Follow-up is recommended. PET/CT could be useful. Alternatively, tissue biopsy.      SUBJECTIVE:  Patient presents for follow-up and treatment.  Denies any new issues since last visit.  Overall feeling much better.    REVIEW OF SYSTEMS:  GENERAL:   Reports of fevers and chills.  reports fatigue   HEENT: no sore throat, congestion, blurry vision  Lungs:  Denies any worsening shortness of breath.  Complains of cough  GI: no nausea, vomiting, constipation, no diarrhea  GU: No dysuria, urgency, increased frequency   Cardiac:  Denies any chest pain, palpitations, syncope,   Neuro: no weakness, loss of function, numbness, tingling  Skin: no rash  Musculoskeletal: No myalgias  Psychosocial: No depression, anxiety  Hematologic: No easy bruising, abnormal bleeding  All other review ROS negative except those mentioned above.     PATIENT HISTORY:  Past Medical History:   Diagnosis Date   . A-fib (CMS HCC)    . Arthropathy, unspecified, site unspecified    . Asthma    . Cancer (CMS HCC)     cervical   . Cataract    . Colon cancer (CMS [facility]) 07/06/2014   . COPD (chronic obstructive pulmonary disease) (CMS HCC)    . Fistula    . HTN (hypertension)    . Hyperlipidemia    . Lung mass     with pleural effusion   . Obesity    . Sleep apnea    . Ventral hernia        Past Surgical History:   Procedure Laterality Date   . ABSCESS DRAINAGE     . BOWEL RESECTION     . CATARACT EXTRACTION     . HX CERVICAL CONE BIOPSY      . HX CESAREAN SECTION     . HX GASTRIC BYPASS      25 years ago   . Pass Christian (x2)   . HX HYSTERECTOMY      late 1990s   . HX LAP BANDING      2013   . HX LAPAROTOMY      2013 to remove lap band       Family Medical History:     Problem Relation (Age of Onset)    Diabetes Mother, Maternal Grandmother, Maternal Grandfather, Father,  Sister    Heart Attack Mother    Stroke Father          Current Outpatient Medications   Medication Sig   . albuterol sulfate (PROVENTIL OR VENTOLIN OR PROAIR) 90 mcg/actuation Inhalation HFA Aerosol Inhaler INHALE 1 TO 2 PUFFS BY MOUTH EVERY 6 HOURS AS NEEDED   . albuterol sulfate (PROVENTIL) 2.5 mg /3 mL (0.083 %) Inhalation Solution for Nebulization 3 mL (2.5 mg  total) by Nebulization route Every 6 hours   . apixaban (ELIQUIS) 5 mg Oral Tablet Take 1 Tab (5 mg total) by mouth Twice daily   . atorvastatin (LIPITOR) 10 mg Oral Tablet Take 10 mg by mouth Once a day   . BREZTRI AEROSPHERE 160-9-4.8 mcg/actuation Inhalation HFA Aerosol Inhaler INHALE 2 PUFFS BY MOUTH TWICE DAILY   . carvediloL (COREG) 3.125 mg Oral Tablet Take 3.125 mg by mouth Twice daily with food   . docusate sodium (COLACE) 100 mg Oral Capsule Take 100 mg by mouth Three times a day as needed    . flecainide (TAMBOCOR) 100 mg Oral Tablet Take 1 Tab (100 mg total) by mouth Twice daily   . guaiFENesin (MUCINEX) 600 mg Oral Tablet Extended Release 12hr Take 2 Tablets (1,200 mg total) by mouth Twice daily   . levothyroxine (SYNTHROID) 112 mcg Oral Tablet Take 1 Tablet (112 mcg total) by mouth Every morning   . linaCLOtide (LINZESS) 72 mcg Oral Capsule Take 72 mcg by mouth Every morning   . lisinopriL (PRINIVIL) 20 mg Oral Tablet Take 20 mg by mouth Once a day   . loperamide (IMODIUM) 2 mg Oral Capsule Take 2 mg by mouth Every 4 hours as needed   . loratadine (CLARITIN) 10 mg Oral Tablet TAKE 1 TABLET BY MOUTH EVERY DAY   . magnesium oxide (MAG-OX) 400 mg Oral Tablet Take 400 mg by mouth Once a day   . omeprazole (PRILOSEC) 20 mg Oral Capsule, Delayed Release(E.C.) Take 40 mg by mouth Once a day    . oxyCODONE (ROXICODONE) 5 mg Oral Tablet Take 1 Tablet (5 mg total) by mouth Every 6 hours as needed for Pain   . potassium chloride (K-DUR) 20 mEq Oral Tab Sust.Rel. Particle/Crystal Take 20 mEq by mouth Once a day Takes 3 days a week   . prochlorperazine  (COMPAZINE) 10 mg Oral Tablet Take 1 Tab (10 mg total) by mouth Four times a day as needed for Nausea/Vomiting   . rOPINIRole (REQUIP) 0.5 mg Oral Tablet Take 0.5 mg by mouth Every night   . tiotropium bromide (SPIRIVA HANDIHALER) 18 mcg Inhalation Capsule, w/Inhalation Device Take 1 Capsule (18 mcg total) by inhalation Once a day for 30 days   . umeclidinium (INCRUSE ELLIPTA) 62.5 mcg/actuation Inhalation Disk with Device Take 1 INHALATION by inhalation Once a day     Social History     Socioeconomic History   . Marital status: Married     Spouse name: Not on file   . Number of children: Not on file   . Years of education: Not on file   . Highest education level: Not on file   Tobacco Use   . Smoking status: Former Research scientist (life sciences)   . Smokeless tobacco: Never Used   . Tobacco comment: quit 10 yrs ago   Vaping Use   . Vaping Use: Never used   Substance and Sexual Activity   . Alcohol use: Not Currently     Comment: rarely   . Drug use: No   . Sexual activity: Not Currently   Other Topics Concern   . Ability to Walk 1 Flight of Steps without SOB/CP No   . Ability To Do Own ADL's Yes     Social Determinants of Health     Financial Resource Strain:    . Difficulty of Paying Living Expenses:    Food Insecurity:    . Worried About Charity fundraiser in the Last Year:    .  Ran Out of Food in the Last Year:    Transportation Needs:    . Film/video editor (Medical):    Marland Kitchen Lack of Transportation (Non-Medical):    Physical Activity:    . Days of Exercise per Week:    . Minutes of Exercise per Session:    Stress:    . Feeling of Stress :    Intimate Partner Violence:    . Fear of Current or Ex-Partner:    . Emotionally Abused:    Marland Kitchen Physically Abused:    . Sexually Abused:        PHYSICAL EXAMINATION:  General Vitals: BP 107/67   Pulse 85   Temp 35.8 C (96.5 F)   Ht 1.575 m (_0 )   Wt 114 kg (250 lb 8 oz)   SpO2 92%   BMI 45.82 kg/m       PHYSICAL EXAM:   Consitutional: Appears fatigue  Eyes: EOMI. No discharge. No  Jaundice.   ENT: mucous membranes dry. No posterior pharynx lesions.. Neck supple, No palpable masses   Heme/ Lymph: No Cervical, Inguinal, axillary lymh nodes. No Bruising.   Cardiovascular: S1, S2, No murmurs, rubs, or gallops.  Respiratory: RLL diminished sounds and otherwise coarse lung sounds noted.   Abdomen: Normal Bowel Sounds, nontender nondistended. No hepatosplenomegaly.   Musculoskeletal: No Edema to the extremities.   Skin: Normal turgor. No Rashes,skin lesions   Psychiatry: Normal Affect   Neuro: No focal deficits. Alert and Oriented x 3      ENCOUNTER ORDERS:  No orders of the defined types were placed in this encounter.      LABORATORY/RADIOLOGICAL DATA: All pertinent labs/radiology were reviewed.   CBC  Diff   Lab Results   Component Value Date/Time    WBC 7.8 01/22/2020 10:58 AM    HGB 11.5 (L) 01/22/2020 10:58 AM    HCT 36.2 01/22/2020 10:58 AM    PLTCNT 255 01/22/2020 10:58 AM    RBC 5.16 01/22/2020 10:58 AM    MCV 70.3 (L) 01/22/2020 10:58 AM    MCHC 31.8 (L) 01/22/2020 10:58 AM    MCH 22.3 (L) 01/22/2020 10:58 AM    RDW 20.0 (H) 01/22/2020 10:58 AM    MPV 8.4 01/22/2020 10:58 AM    Lab Results   Component Value Date/Time    PMNS 75 01/22/2020 10:58 AM    LYMPHOCYTES 10 01/22/2020 10:58 AM    EOSINOPHIL 2 01/22/2020 10:58 AM    MONOCYTES 12 01/22/2020 10:58 AM    BASOPHILS 1 01/22/2020 10:58 AM    BASOPHILS 0.10 01/22/2020 10:58 AM    PMNABS 5.80 01/22/2020 10:58 AM    LYMPHSABS 0.80 (L) 01/22/2020 10:58 AM    EOSABS 0.20 01/22/2020 10:58 AM    MONOSABS 0.90 01/22/2020 10:58 AM    BASOSABS 0.10 11/10/2014 02:28 PM    BASABS 0.01 09/17/2018 08:24 AM        COMPREHENSIVE METABOLIC PANEL  Lab Results   Component Value Date    SODIUM 140 01/22/2020    POTASSIUM 4.1 01/22/2020    CHLORIDE 108 01/22/2020    CO2 25 01/22/2020    ANIONGAP 7 01/22/2020    BUN 10 01/22/2020    CREATININE 0.78 01/22/2020    GLUCOSE Negative 09/24/2018    CALCIUM 8.5 (L) 01/22/2020    PHOSPHORUS 4.2 12/14/2019    ALBUMIN  3.5 01/22/2020    TOTALPROTEIN 6.1 01/22/2020    ALKPHOS 76 01/22/2020    AST 32 01/22/2020  ALT 21 01/22/2020    BILIRUBINCON 0.2 03/22/2018     THYROID STIMULATING HORMONE  Lab Results   Component Value Date    TSH 3.532 12/10/2019         ASSESSMENT AND PLAN:  69 y.o. female with history of stage III colon cancer status post 5 cycle of FOLFOX now presenting with stage III lung cancer.      ICD-10-CM    1. Encounter for antineoplastic chemotherapy  Z51.11    2. Malignant neoplasm of lung, unspecified laterality, unspecified part of lung (CMS HCC)  C34.90        1. Adenocarcinoma lung:  Stage III.    CT brain to rule out metastatic disease.  NEGATIVE.   Started on chemoradiation carboplatin/Taxol along with radiation- July 23, 2018. PET scan showed good response.  Will complete  immunotherapy for 1 year.   Completed 6/6 cycles of Taxol/Carboplatin and currently on maintenance durvalumab.    CHEMOTHERAPY:/RADIATION  CYCLE 18/26 DAY   DRUG DOSE TOTAL DOSE % ROUTE SCHEDULE   Carboplatin AUC- 2 205 mg    COMPLETED   Paclitaxel 45 mg/m2 108 mg    COMPLETED   Durvalumab     Q 2 wks                Return in about 2 weeks (around 02/05/2020) for cbc/diff, CMP, Treatment.    Vanetta Mulders, MD  Hematology/Oncology

## 2020-02-05 ENCOUNTER — Encounter (HOSPITAL_COMMUNITY): Payer: Self-pay | Admitting: Internal Medicine

## 2020-02-05 ENCOUNTER — Inpatient Hospital Stay (HOSPITAL_COMMUNITY): Payer: Medicare Other

## 2020-02-18 ENCOUNTER — Other Ambulatory Visit (HOSPITAL_COMMUNITY): Payer: Self-pay | Admitting: Internal Medicine

## 2020-02-18 MED ORDER — OXYCODONE 5 MG TABLET
5.00 mg | ORAL_TABLET | Freq: Four times a day (QID) | ORAL | 0 refills | Status: DC | PRN
Start: 2020-02-18 — End: 2020-04-13

## 2020-02-24 ENCOUNTER — Inpatient Hospital Stay (HOSPITAL_COMMUNITY): Payer: Medicare Other

## 2020-02-24 ENCOUNTER — Encounter (HOSPITAL_COMMUNITY): Payer: Self-pay | Admitting: Internal Medicine

## 2020-03-22 ENCOUNTER — Telehealth (HOSPITAL_COMMUNITY): Payer: Self-pay | Admitting: Internal Medicine

## 2020-03-22 ENCOUNTER — Telehealth (HOSPITAL_COMMUNITY): Payer: Self-pay | Admitting: Nurse Practitioner

## 2020-03-22 NOTE — Telephone Encounter (Signed)
Spoke to patient and husband about postponing her therapy ensured she understood the risks and benefits of postponing. Let her know that her disease could progress if she does not follow standard of care for her malignancy and she verbalized understanding. Patient more worried about exposure to new COVID delta variant. Patient has no other questions or concerns at this time. Omer Jack, APRN, FNP-C  Hematology/Oncology

## 2020-03-22 NOTE — Telephone Encounter (Signed)
Patient r/s her treatment appointment for tomorrow, 08.04.21 to 10.06.21.  She is fearful to come out due to the new strain of Covid.

## 2020-03-24 ENCOUNTER — Encounter (HOSPITAL_COMMUNITY): Payer: Self-pay | Admitting: Internal Medicine

## 2020-03-24 ENCOUNTER — Inpatient Hospital Stay (HOSPITAL_COMMUNITY): Payer: Medicare Other

## 2020-04-13 ENCOUNTER — Other Ambulatory Visit (HOSPITAL_COMMUNITY): Payer: Self-pay | Admitting: Internal Medicine

## 2020-04-13 MED ORDER — OXYCODONE 5 MG TABLET
5.00 mg | ORAL_TABLET | Freq: Four times a day (QID) | ORAL | 0 refills | Status: DC | PRN
Start: 2020-04-13 — End: 2020-05-23

## 2020-05-23 ENCOUNTER — Other Ambulatory Visit (HOSPITAL_COMMUNITY): Payer: Self-pay | Admitting: Internal Medicine

## 2020-05-24 MED ORDER — OXYCODONE 5 MG TABLET
5.00 mg | ORAL_TABLET | Freq: Four times a day (QID) | ORAL | 0 refills | Status: DC | PRN
Start: 2020-05-24 — End: 2020-06-22

## 2020-05-25 ENCOUNTER — Inpatient Hospital Stay (HOSPITAL_COMMUNITY): Payer: Medicare Other

## 2020-05-25 ENCOUNTER — Encounter (HOSPITAL_COMMUNITY): Payer: Self-pay | Admitting: Internal Medicine

## 2020-06-02 ENCOUNTER — Encounter (HOSPITAL_COMMUNITY): Payer: Self-pay | Admitting: Internal Medicine

## 2020-06-22 ENCOUNTER — Telehealth (HOSPITAL_COMMUNITY): Payer: Self-pay | Admitting: Internal Medicine

## 2020-06-22 ENCOUNTER — Other Ambulatory Visit (HOSPITAL_COMMUNITY): Payer: Self-pay | Admitting: Internal Medicine

## 2020-06-22 ENCOUNTER — Encounter (HOSPITAL_COMMUNITY): Payer: Self-pay | Admitting: Internal Medicine

## 2020-06-22 ENCOUNTER — Inpatient Hospital Stay (HOSPITAL_COMMUNITY): Payer: Medicare Other

## 2020-06-22 MED ORDER — OXYCODONE 5 MG TABLET
5.00 mg | ORAL_TABLET | Freq: Four times a day (QID) | ORAL | 0 refills | Status: AC | PRN
Start: 2020-06-22 — End: ?

## 2020-06-22 NOTE — Telephone Encounter (Signed)
Pt's husband's car won't start; he asked to cancel appt and will call us back to reschedule

## 2020-06-29 ENCOUNTER — Inpatient Hospital Stay (HOSPITAL_COMMUNITY)
Admission: RE | Admit: 2020-06-29 | Discharge: 2020-06-29 | Disposition: A | Discharge status: Still In Hospital | Payer: Medicare Other | Source: Ambulatory Visit

## 2020-06-29 ENCOUNTER — Encounter (HOSPITAL_COMMUNITY): Payer: Self-pay | Admitting: Internal Medicine

## 2020-06-29 ENCOUNTER — Ambulatory Visit (HOSPITAL_COMMUNITY)
Admission: RE | Admit: 2020-06-29 | Discharge: 2020-06-29 | Disposition: A | Discharge status: Home / Self Care | Payer: Medicare Other | Source: Ambulatory Visit

## 2020-06-29 ENCOUNTER — Ambulatory Visit (HOSPITAL_BASED_OUTPATIENT_CLINIC_OR_DEPARTMENT_OTHER): Payer: Medicare Other | Admitting: Internal Medicine

## 2020-06-29 ENCOUNTER — Other Ambulatory Visit: Payer: Self-pay

## 2020-06-29 VITALS — BP 155/83 | HR 71 | Temp 98.1°F | Ht 62.0 in | Wt 264.4 lb

## 2020-06-29 VITALS — BP 155/83 | HR 71 | Temp 98.1°F | Resp 16

## 2020-06-29 DIAGNOSIS — C3490 Malignant neoplasm of unspecified part of unspecified bronchus or lung: Secondary | ICD-10-CM | POA: Insufficient documentation

## 2020-06-29 DIAGNOSIS — G893 Neoplasm related pain (acute) (chronic): Secondary | ICD-10-CM | POA: Insufficient documentation

## 2020-06-29 DIAGNOSIS — Z5111 Encounter for antineoplastic chemotherapy: Secondary | ICD-10-CM

## 2020-06-29 DIAGNOSIS — E039 Hypothyroidism, unspecified: Secondary | ICD-10-CM | POA: Insufficient documentation

## 2020-06-29 DIAGNOSIS — Z95828 Presence of other vascular implants and grafts: Secondary | ICD-10-CM | POA: Insufficient documentation

## 2020-06-29 DIAGNOSIS — Z5112 Encounter for antineoplastic immunotherapy: Secondary | ICD-10-CM | POA: Insufficient documentation

## 2020-06-29 DIAGNOSIS — Z23 Encounter for immunization: Secondary | ICD-10-CM | POA: Insufficient documentation

## 2020-06-29 DIAGNOSIS — Z87891 Personal history of nicotine dependence: Secondary | ICD-10-CM | POA: Insufficient documentation

## 2020-06-29 LAB — CBC WITH DIFF
BASOPHIL #: 0 10*3/uL (ref 0.00–0.20)
BASOPHIL %: 1 %
EOSINOPHIL #: 0.1 10*3/uL (ref 0.00–0.50)
EOSINOPHIL %: 2 %
HCT: 34.4 % — ABNORMAL LOW (ref 34.6–46.2)
HGB: 11 g/dL — ABNORMAL LOW (ref 11.8–15.8)
LYMPHOCYTE #: 1 10*3/uL (ref 0.90–3.40)
LYMPHOCYTE %: 17 %
MCH: 22.7 pg — ABNORMAL LOW (ref 27.6–33.2)
MCHC: 32 g/dL — ABNORMAL LOW (ref 32.6–35.4)
MCV: 70.8 fL — ABNORMAL LOW (ref 82.3–96.7)
MONOCYTE #: 0.8 10*3/uL (ref 0.20–0.90)
MONOCYTE %: 12 %
MPV: 7.9 fL (ref 6.6–10.2)
NEUTROPHIL #: 4.2 10*3/uL (ref 1.50–6.40)
NEUTROPHIL %: 69 %
PLATELETS: 247 10*3/uL (ref 140–440)
RBC: 4.86 10*6/uL (ref 3.80–5.24)
RDW: 19.2 % — ABNORMAL HIGH (ref 12.4–15.2)
WBC: 6.1 10*3/uL (ref 3.5–10.3)

## 2020-06-29 LAB — COMPREHENSIVE METABOLIC PANEL, NON-FASTING
ALBUMIN: 3.5 g/dL (ref 3.2–4.6)
ALKALINE PHOSPHATASE: 58 U/L (ref 20–130)
ALT (SGPT): 12 U/L (ref <=52)
ANION GAP: 7 mmol/L
AST (SGOT): 18 U/L (ref <=35)
BILIRUBIN TOTAL: 0.5 mg/dL (ref 0.3–1.2)
BUN/CREA RATIO: 16
BUN: 13 mg/dL (ref 10–25)
CALCIUM: 8.2 mg/dL — ABNORMAL LOW (ref 8.8–10.3)
CHLORIDE: 106 mmol/L (ref 98–111)
CO2 TOTAL: 24 mmol/L (ref 21–35)
CREATININE: 0.79 mg/dL (ref <=1.30)
ESTIMATED GFR: >60 mL/min/{1.73_m2}
GLUCOSE: 101 mg/dL (ref 70–110)
POTASSIUM: 4 mmol/L (ref 3.5–5.0)
PROTEIN TOTAL: 5.9 g/dL — ABNORMAL LOW (ref 6.0–8.3)
SODIUM: 137 mmol/L (ref 135–145)

## 2020-06-29 MED ORDER — SODIUM CHLORIDE 0.9 % INTRAVENOUS SOLUTION
10.0000 mg/kg | Freq: Once | INTRAVENOUS | Status: AC
Start: 2020-06-29 — End: 2020-06-29
  Administered 2020-06-29: 0 mg via INTRAVENOUS
  Administered 2020-06-29: 1140 mg via INTRAVENOUS
  Filled 2020-06-29: qty 22.8

## 2020-06-29 NOTE — Progress Notes (Addendum)
[facility]  [place] [id number]        Encounter Date: 06/29/2020   9:45 AM EST    Name:  Kelsey [name]  Age: 69 y.o.  DOB: 1951/07/08  Sex: female  PCP: Benjie Karvonen    Chief Complaint:    Chief Complaint   Patient presents with    Lung Cancer    Fatigue     HISTORY OF PRESENT ILLNESS:   69 y.o. female with history of stage III colon cancer of present evaluation and management of lung cancer.    1. September 2015. Patient underwent sigmoidoscopy and was found to have polyp which was adenocarcinoma.    2. June 10, 2014. She was admitted for segmental resection of sigmoid colon. This revealed pT3, N1a, MX, low-grade  adenocarcinoma of the colon. The mass measuring 4 x 3 x 0.5 centimeters. It  was low-grade and there was no evidence of microsatellite instability by  histology. Margins were uninvolved and 4 lymph nodes only were removed which  only 1 was involved. This corresponding stage IIIB, T3, N1a, M0, low-grade  sigmoid cancer.     3. July 07, 2014. Recommended adjuvant chemotherapy with FOLFOX.    4. July 27, 2014. Patient was started on adjuvant chemotherapy with FOLFOX.  Patient received only 5 treatments and chemotherapy was stopped due to poor tolerance.  After that patient lost follow-up.      5. October 2019. Patient was admitted to hospital with concern for  bowel obstruction.  CT chest showed lung nodules.  Patient was managed conservatively and discharged home.    6. May 06, 2018.  Patient underwent CT-guided biopsy of lung lesion.  Pathology showed well-differentiated adenocarcinoma.  Section shows well-differentiated adenocarcinoma which positive for CK 7 and TTF 1, negative for P 63 and CK 20.     7. June 23, 2018. PET scan showed Hypermetabolic right lower lobe pulmonary mass compatible with malignancy.  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  Hepatomegaly and steatosis.     8. July 23, 2018. Patient was started on chemoradiation.    9. October 30, 2018. PET scan showed Hypermetabolic right lower lobe mass with abnormal mediastinal and hilar nodes. There has been only mild improvement since earlier exam.     10. October 31, 2018. Patient was started on maintenance immunotherapy with durvalumab.    11. February 17, 2019. CT chest revealed Moderate right effusion new from 12/19/2018. No change in the right lower lung mass or a presumed postradiation changes in the right upper lung when compared to 12/19/2018. Mass in the right cardiophrenic angle increased from 10/30/2019 6). Consider an enlarged lymph node here. This is unchanged from 12/19/2018 but increased since size from 10/30/2018. Another possibility is an enlarging pericardial cyst (this region was not hypermetabolic 1/96/2229). Mild enlargement of the left adrenal gland stable from 10/30/2018. Approximate 9 cm upper abdominal ventral hernia containing nonobstructed bowel loops unchanged    12. March 06, 2019. PET CT revealed 1. The RIGHT lower lobe mass has decreased in size and uptake, which is consistent with response to therapy. However, abnormal uptake remains. This is consistent with residual tumor. Suspect post radiation change in the RIGHT  pneumothorax. Consolidation in the medial RIGHT lung has tumor level uptake. Differential: Post radiation change, infection, and tumor infiltration. Additional findings are concerning for tumor infiltration. Follow-up recommended. Increased RIGHT hilar density and uptake. Concerning for tumor spread. Post radiation change less likely. 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 A low-attenuation mass in the RIGHT cardiophrenic angle may be related. Lymph node metastasis less likely.    13. October 29, 2019. CT chest revealed 1. Persistent dense consolidation in the RIGHT middle lobe. Compatible with post radiation change. Residual tumor cannot be excluded. 2. Scattered  groundglass opacities in both lungs. Favor post radiation change or infiltrate or tumor. Follow-up recommended. 3. A 5 mm nodule in the LEFT lingula is concerning for metastatic disease. Follow-up is recommended. PET/CT could be useful. Alternatively, tissue biopsy.      SUBJECTIVE:  Patient presents for follow-up and treatment.  Patient was last seen in June 2021. Patient did not come for her treatment due to pandemic.  Patient states that she has fully vaccinated against COVID-19.  She was still worried about COVID and did not showed up for her appointments.  Currently she denies any worsening shortness of breath or chest pain.  No fevers or chills.    REVIEW OF SYSTEMS:  GENERAL:   Reports of fevers and chills.  reports fatigue   HEENT: no sore throat, congestion, blurry vision  Lungs:  Denies any worsening shortness of breath.  Complains of cough. Reports dyspnea with exertion  GI: no nausea, vomiting, constipation, no diarrhea  GU: No dysuria, urgency, increased frequency   Cardiac:  Denies any chest pain, palpitations, syncope,   Neuro: reports weakness, loss of function, numbness, tingling  Skin: no rash  Musculoskeletal: reports myalgias and weakness  Psychosocial: No depression, anxiety  Hematologic: No easy bruising, abnormal bleeding  All other review ROS negative except those mentioned above.     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     HTN (hypertension)     Hyperlipidemia     Lung mass     with pleural effusion    Obesity     Sleep apnea     Ventral hernia        Past Surgical History:   Procedure Laterality Date    ABSCESS DRAINAGE      BOWEL RESECTION      CATARACT EXTRACTION      HX CERVICAL CONE BIOPSY       HX CESAREAN SECTION       HX GASTRIC BYPASS      25 years ago    [place] (x2)    HX HYSTERECTOMY      late 20s    HX LAP BANDING      2013    HX LAPAROTOMY      2013 to remove lap band       Family Medical History:     Problem Relation (Age of Onset)    Diabetes Mother, Maternal Grandmother, Maternal Grandfather, Father, Sister    Heart Attack Mother    Stroke Father  Current Outpatient Medications   Medication Sig    albuterol sulfate (PROVENTIL OR VENTOLIN OR PROAIR) 90 mcg/actuation Inhalation HFA Aerosol Inhaler INHALE 1 TO 2 PUFFS BY MOUTH EVERY 6 HOURS AS NEEDED    albuterol sulfate (PROVENTIL) 2.5 mg /3 mL (0.083 %) Inhalation Solution for Nebulization 3 mL (2.5 mg total) by Nebulization route Every 6 hours    apixaban (ELIQUIS) 5 mg Oral Tablet Take 1 Tab (5 mg total) by mouth Twice daily    atorvastatin (LIPITOR) 10 mg Oral Tablet Take 10 mg by mouth Once a day    BREZTRI AEROSPHERE 160-9-4.8 mcg/actuation Inhalation HFA Aerosol Inhaler INHALE 2 PUFFS BY MOUTH TWICE DAILY    carvediloL (COREG) 3.125 mg Oral Tablet Take 3.125 mg by mouth Twice daily with food    docusate sodium (COLACE) 100 mg Oral Capsule Take 100 mg by mouth Three times a day as needed     flecainide (TAMBOCOR) 100 mg Oral Tablet Take 1 Tab (100 mg total) by mouth Twice daily    guaiFENesin (MUCINEX) 600 mg Oral Tablet Extended Release 12hr Take 2 Tablets (1,200 mg total) by mouth Twice daily    levothyroxine (SYNTHROID) 112 mcg Oral Tablet Take 1 Tablet (112 mcg total) by mouth Every morning    linaCLOtide (LINZESS) 72 mcg Oral Capsule Take 72 mcg by mouth Every morning    lisinopriL (PRINIVIL) 20 mg Oral Tablet Take 20 mg by mouth Once a day    loperamide (IMODIUM) 2 mg Oral Capsule Take 2 mg by mouth Every 4 hours as needed    loratadine (CLARITIN) 10 mg Oral Tablet TAKE 1 TABLET BY MOUTH EVERY DAY    magnesium oxide (MAG-OX) 400 mg Oral Tablet Take 400 mg by mouth Once a day    omeprazole (PRILOSEC) 20 mg  Oral Capsule, Delayed Release(E.C.) Take 40 mg by mouth Once a day     oxyCODONE (ROXICODONE) 5 mg Oral Tablet Take 1 Tablet (5 mg total) by mouth Every 6 hours as needed for Pain    potassium chloride (K-DUR) 20 mEq Oral Tab Sust.Rel. Particle/Crystal Take 20 mEq by mouth Once a day Takes 3 days a week    prochlorperazine (COMPAZINE) 10 mg Oral Tablet Take 1 Tab (10 mg total) by mouth Four times a day as needed for Nausea/Vomiting    rOPINIRole (REQUIP) 0.5 mg Oral Tablet Take 0.5 mg by mouth Every night    tiotropium bromide (SPIRIVA HANDIHALER) 18 mcg Inhalation Capsule, w/Inhalation Device Take 1 Capsule (18 mcg total) by inhalation Once a day for 30 days    umeclidinium (INCRUSE ELLIPTA) 62.5 mcg/actuation Inhalation Disk with Device Take 1 INHALATION by inhalation Once a day     Social History     Socioeconomic History    Marital status: Married     Spouse name: Not on file    Number of children: Not on file    Years of education: Not on file    Highest education level: Not on file   Tobacco Use    Smoking status: Former Smoker    Smokeless tobacco: Never Used    Tobacco comment: quit 10 yrs ago   Vaping Use    Vaping Use: Never used   Substance and Sexual Activity    Alcohol use: Not Currently     Comment: rarely    Drug use: No    Sexual activity: Not Currently   Other Topics Concern    Ability to Walk 1 Flight of Steps without  SOB/CP No    Ability To Do Own ADL's Yes       PHYSICAL EXAMINATION:  General Vitals: BP (!) 155/83    Pulse 71    Temp 36.7 C (98.1 F)    Ht 1.575 m (5' 2" )    Wt 120 kg (264 lb 6.4 oz)    SpO2 97%    BMI 48.36 kg/m       PHYSICAL EXAM:   Consitutional: Appears fatigue  Eyes: EOMI. No discharge. No Jaundice.   ENT: mucous membranes dry. No posterior pharynx lesions.. Neck supple, No palpable masses   Heme/ Lymph: No Cervical, Inguinal, axillary lymh nodes. No Bruising.   Cardiovascular: S1, S2, No murmurs, rubs, or gallops.  Respiratory: RLL diminished sounds  and otherwise coarse lung sounds noted.   Abdomen: Normal Bowel Sounds, nontender nondistended. No hepatosplenomegaly.   Musculoskeletal: No Edema to the extremities.   Skin: Normal turgor. No Rashes,skin lesions   Psychiatry: Normal Affect   Neuro: No focal deficits. Alert and Oriented x 3      ENCOUNTER ORDERS:  Orders Placed This Encounter    CT CHEST WO IV CONTRAST       LABORATORY/RADIOLOGICAL DATA: All pertinent labs/radiology were reviewed.   CBC  Diff   Lab Results   Component Value Date/Time    WBC 6.1 06/29/2020 09:00 AM    HGB 11.0 (L) 06/29/2020 09:00 AM    HCT 34.4 (L) 06/29/2020 09:00 AM    PLTCNT 247 06/29/2020 09:00 AM    RBC 4.86 06/29/2020 09:00 AM    MCV 70.8 (L) 06/29/2020 09:00 AM    MCHC 32.0 (L) 06/29/2020 09:00 AM    MCH 22.7 (L) 06/29/2020 09:00 AM    RDW 19.2 (H) 06/29/2020 09:00 AM    MPV 7.9 06/29/2020 09:00 AM    Lab Results   Component Value Date/Time    PMNS 69 06/29/2020 09:00 AM    LYMPHOCYTES 17 06/29/2020 09:00 AM    EOSINOPHIL 2 06/29/2020 09:00 AM    MONOCYTES 12 06/29/2020 09:00 AM    BASOPHILS 1 06/29/2020 09:00 AM    BASOPHILS 0.00 06/29/2020 09:00 AM    PMNABS 4.20 06/29/2020 09:00 AM    LYMPHSABS 1.00 06/29/2020 09:00 AM    EOSABS 0.10 06/29/2020 09:00 AM    MONOSABS 0.80 06/29/2020 09:00 AM    BASOSABS 0.10 11/10/2014 02:28 PM    BASABS 0.01 09/17/2018 08:24 AM        COMPREHENSIVE METABOLIC PANEL  Lab Results   Component Value Date    SODIUM 137 06/29/2020    POTASSIUM 4.0 06/29/2020    CHLORIDE 106 06/29/2020    CO2 24 06/29/2020    ANIONGAP 7 06/29/2020    BUN 13 06/29/2020    CREATININE 0.79 06/29/2020    GLUCOSE Negative 09/24/2018    CALCIUM 8.2 (L) 06/29/2020    PHOSPHORUS 4.2 12/14/2019    ALBUMIN 3.5 06/29/2020    TOTALPROTEIN 5.9 (L) 06/29/2020    ALKPHOS 58 06/29/2020    AST 18 06/29/2020    ALT 12 06/29/2020    BILIRUBINCON 0.2 03/22/2018     THYROID STIMULATING HORMONE  Lab Results   Component Value Date    TSH 2.310 01/22/2020         ASSESSMENT AND PLAN:  69  y.o. female with history of stage III colon cancer status post 5 cycle of FOLFOX now presenting with stage III lung cancer.      ICD-10-CM    1.  Encounter for antineoplastic chemotherapy  Z51.11 CT CHEST WO IV CONTRAST   2. Malignant neoplasm of lung, unspecified laterality, unspecified part of lung (CMS HCC)  C34.90 CT CHEST WO IV CONTRAST   3. Cancer related pain  G89.3 CT CHEST WO IV CONTRAST       1. Adenocarcinoma lung:  Stage III.    CT brain to rule out metastatic disease.  NEGATIVE.   Started on chemoradiation carboplatin/Taxol along with radiation- July 23, 2018. PET scan showed good response.  Will complete  immunotherapy for 1 year.   Completed 6/6 cycles of Taxol/Carboplatin and currently on maintenance durvalumab.    CHEMOTHERAPY:/RADIATION  CYCLE 19/26 DAY   DRUG DOSE TOTAL DOSE % ROUTE SCHEDULE   Carboplatin AUC- 2 205 mg    COMPLETED   Paclitaxel 45 mg/m2 108 mg    COMPLETED   Durvalumab     Q 2 wks              Will repeat CT chest to assess disease status.      Power scooter  The patient has a mobility limitation that significantly impairs his/her ability to particiapate in one or more mobility-related activities of daily living in the home; AND the mobility deficit cannot be sufficiently resolved by the use of an appropriately fitted cane or walker; AND use of a power wheelchair will significantly improve the patient's ability to participate in ADLs and the patient will use it on a regular basis in the home.     Return in about 2 weeks (around 07/13/2020) for cbc/diff, CMP, Treatment.    Vanetta Mulders, MD  Hematology/Oncology

## 2020-06-29 NOTE — Nurses Notes (Addendum)
1024 - R chest port flushed with 10 ml NS, + BR. Imfinzi started to infuse over a one hour rate.[name], RN  1130: Imfinzi infusion complete. Port flushed with 10 cc of NS,+BR followed by 5 cc of heparin. Port deaccessed. Huber needle intact. Band-aid applied to site. Pt aware of upcoming appointments. Pt d/c ambulatory.Christinia Gully, RN

## 2020-06-29 NOTE — Nurses Notes (Signed)
0901:  Right chest port accessed with 19 gauge 0.75" Huber under sterile procedure. Positive blood return noted. Lab specimens drawn. Flushed with 10 mL NS. Dressing applied and intact by T. Stump, RN. Patient discharged ambulatory without any complaints to waiting room. Quintin Alto, LPN

## 2020-06-30 ENCOUNTER — Telehealth (HOSPITAL_COMMUNITY): Payer: Self-pay

## 2020-06-30 ENCOUNTER — Other Ambulatory Visit (HOSPITAL_COMMUNITY): Payer: Self-pay | Admitting: Nurse Practitioner

## 2020-06-30 NOTE — Telephone Encounter (Signed)
Navigation: Office note and face sheet, and order for motorized scooter faxed to [employer]. Fax confirmation received.     P: [phone]  F: [phone]

## 2020-06-30 NOTE — Telephone Encounter (Signed)
Spoke with patients husband and made him aware of her appt time and date. Her appt is 07/13/2020 @ 1145Am

## 2020-07-13 ENCOUNTER — Ambulatory Visit (HOSPITAL_COMMUNITY)
Admission: RE | Admit: 2020-07-13 | Discharge: 2020-07-13 | Disposition: A | Discharge status: Home / Self Care | Payer: Medicare Other | Source: Ambulatory Visit

## 2020-07-13 ENCOUNTER — Ambulatory Visit: Payer: Medicare Other | Attending: Internal Medicine | Admitting: Internal Medicine

## 2020-07-13 ENCOUNTER — Encounter (HOSPITAL_COMMUNITY): Payer: Self-pay | Admitting: Internal Medicine

## 2020-07-13 ENCOUNTER — Ambulatory Visit (HOSPITAL_COMMUNITY)
Admission: RE | Admit: 2020-07-13 | Discharge: 2020-07-13 | Disposition: A | Discharge status: Home / Self Care | Payer: Medicare Other | Source: Ambulatory Visit | Attending: Internal Medicine | Admitting: Internal Medicine

## 2020-07-13 ENCOUNTER — Inpatient Hospital Stay (HOSPITAL_COMMUNITY)
Admission: RE | Admit: 2020-07-13 | Discharge: 2020-07-13 | Disposition: A | Discharge status: Still In Hospital | Payer: Medicare Other | Source: Ambulatory Visit

## 2020-07-13 ENCOUNTER — Other Ambulatory Visit: Payer: Self-pay

## 2020-07-13 VITALS — BP 150/80 | HR 70 | Temp 96.8°F | Ht 62.0 in | Wt 269.0 lb

## 2020-07-13 VITALS — BP 150/80 | HR 70 | Temp 96.8°F | Resp 18

## 2020-07-13 DIAGNOSIS — G893 Neoplasm related pain (acute) (chronic): Secondary | ICD-10-CM

## 2020-07-13 DIAGNOSIS — E039 Hypothyroidism, unspecified: Secondary | ICD-10-CM | POA: Insufficient documentation

## 2020-07-13 DIAGNOSIS — Z5111 Encounter for antineoplastic chemotherapy: Secondary | ICD-10-CM | POA: Insufficient documentation

## 2020-07-13 DIAGNOSIS — C3490 Malignant neoplasm of unspecified part of unspecified bronchus or lung: Secondary | ICD-10-CM

## 2020-07-13 LAB — COMPREHENSIVE METABOLIC PANEL, NON-FASTING
ALBUMIN: 3.4 g/dL (ref 3.2–4.6)
ALKALINE PHOSPHATASE: 69 U/L (ref 20–130)
ALT (SGPT): 13 U/L (ref <=52)
ANION GAP: 6 mmol/L
AST (SGOT): 19 U/L (ref <=35)
BILIRUBIN TOTAL: 0.5 mg/dL (ref 0.3–1.2)
BUN/CREA RATIO: 14
BUN: 12 mg/dL (ref 10–25)
CALCIUM: 8.1 mg/dL — ABNORMAL LOW (ref 8.8–10.3)
CHLORIDE: 106 mmol/L (ref 98–111)
CO2 TOTAL: 26 mmol/L (ref 21–35)
CREATININE: 0.85 mg/dL (ref <=1.30)
ESTIMATED GFR: >60 mL/min/{1.73_m2}
GLUCOSE: 120 mg/dL — ABNORMAL HIGH (ref 70–110)
POTASSIUM: 3.8 mmol/L (ref 3.5–5.0)
PROTEIN TOTAL: 5.7 g/dL — ABNORMAL LOW (ref 6.0–8.3)
SODIUM: 138 mmol/L (ref 135–145)

## 2020-07-13 LAB — CBC WITH DIFF
BASOPHIL #: 0.1 10*3/uL (ref 0.00–0.20)
BASOPHIL %: 1 %
EOSINOPHIL #: 0.1 10*3/uL (ref 0.00–0.50)
EOSINOPHIL %: 2 %
HCT: 34.4 % — ABNORMAL LOW (ref 34.6–46.2)
HGB: 10.9 g/dL — ABNORMAL LOW (ref 11.8–15.8)
LYMPHOCYTE #: 0.9 10*3/uL (ref 0.90–3.40)
LYMPHOCYTE %: 15 %
MCH: 22.7 pg — ABNORMAL LOW (ref 27.6–33.2)
MCHC: 31.6 g/dL — ABNORMAL LOW (ref 32.6–35.4)
MCV: 71.9 fL — ABNORMAL LOW (ref 82.3–96.7)
MONOCYTE #: 0.7 10*3/uL (ref 0.20–0.90)
MONOCYTE %: 12 %
MPV: 8.7 fL (ref 6.6–10.2)
NEUTROPHIL #: 4.3 10*3/uL (ref 1.50–6.40)
NEUTROPHIL %: 70 %
PLATELETS: 217 10*3/uL (ref 140–440)
RBC: 4.79 10*6/uL (ref 3.80–5.24)
RDW: 19.4 % — ABNORMAL HIGH (ref 12.4–15.2)
WBC: 6.1 10*3/uL (ref 3.5–10.3)

## 2020-07-13 MED ORDER — FLU VACC 2021-22(65YR UP)-MF59C(PF) 60 MCG(15 MCGX4)/0.5 ML IM SYRINGE
0.5000 mL | INJECTION | Freq: Once | INTRAMUSCULAR | Status: AC
Start: 2020-07-13 — End: 2020-07-13
  Administered 2020-07-13: 0.5 mL via INTRAMUSCULAR
  Filled 2020-07-13: qty 0.5

## 2020-07-13 MED ORDER — SODIUM CHLORIDE 0.9 % INTRAVENOUS SOLUTION
10.0000 mg/kg | Freq: Once | INTRAVENOUS | Status: AC
Start: 2020-07-13 — End: 2020-07-13
  Administered 2020-07-13: 0 mg via INTRAVENOUS
  Administered 2020-07-13: 1140 mg via INTRAVENOUS
  Filled 2020-07-13: qty 22.8

## 2020-07-13 NOTE — Nurses Notes (Addendum)
0900- Gave IM flu vaccin in left deltoid, covered site with Band-Aid.Jourdan Maldonado, RN  [phone]- Notified Dr. Deatra Canter patient has had a greater than 7 percent weight increase. Dr. Deatra Canter said " that's ok.Marland Kitchen".Jodeci Roarty, RN  [phone]- flushed right chest port with 10 cc of NS, + Br. Started imfinzi to infuse over 1 hour.Kaysha Parsell, RN  [place] finished infusing.Flushed port with 10 cc of NS. + BR, then flushed with 5 cc of heparin. Deaccess port huber intact covered site with guaze and Band-Aid. Patient denied any complaints and left via wheelchair. Banesa Tristan, RN

## 2020-07-13 NOTE — Progress Notes (Signed)
[place]  [place] [id number]        Encounter Date: 07/13/2020   9:00 AM EST    Name:  Kelsey [name]  Age: 69 y.o.  DOB: Dec 28, 1950  Sex: female  PCP: Benjie Karvonen    Chief Complaint:    Chief Complaint   Patient presents with    Lung Cancer     HISTORY OF PRESENT ILLNESS:   69 y.o. female with history of stage III colon cancer of present evaluation and management of lung cancer.    1. September 2015. Patient underwent sigmoidoscopy and was found to have polyp which was adenocarcinoma.    2. June 10, 2014. She was admitted for segmental resection of sigmoid colon. This revealed pT3, N1a, MX, low-grade  adenocarcinoma of the colon. The mass measuring 4 x 3 x 0.5 centimeters. It  was low-grade and there was no evidence of microsatellite instability by  histology. Margins were uninvolved and 4 lymph nodes only were removed which  only 1 was involved. This corresponding stage IIIB, T3, N1a, M0, low-grade  sigmoid cancer.     3. July 07, 2014. Recommended adjuvant chemotherapy with FOLFOX.    4. July 27, 2014. Patient was started on adjuvant chemotherapy with FOLFOX.  Patient received only 5 treatments and chemotherapy was stopped due to poor tolerance.  After that patient lost follow-up.      5. October 2019. Patient was admitted to hospital with concern for  bowel obstruction.  CT chest showed lung nodules.  Patient was managed conservatively and discharged home.    6. May 06, 2018.  Patient underwent CT-guided biopsy of lung lesion.  Pathology showed well-differentiated adenocarcinoma.  Section shows well-differentiated adenocarcinoma which positive for CK 7 and TTF 1, negative for P 63 and CK 20.     7. June 23, 2018. PET scan showed Hypermetabolic right lower lobe pulmonary mass compatible with malignancy.  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 Hepatomegaly  and steatosis.     8. July 23, 2018. Patient was started on chemoradiation.    9. October 30, 2018. PET scan showed Hypermetabolic right lower lobe mass with abnormal mediastinal and hilar nodes. There has been only mild improvement since earlier exam.     10. October 31, 2018. Patient was started on maintenance immunotherapy with durvalumab.    11. February 17, 2019. CT chest revealed Moderate right effusion new from 12/19/2018. No change in the right lower lung mass or a presumed postradiation changes in the right upper lung when compared to 12/19/2018. Mass in the right cardiophrenic angle increased from 10/30/2019 6). Consider an enlarged lymph node here. This is unchanged from 12/19/2018 but increased since size from 10/30/2018. Another possibility is an enlarging pericardial cyst (this region was not hypermetabolic 08/20/7508). Mild enlargement of the left adrenal gland stable from 10/30/2018. Approximate 9 cm upper abdominal ventral hernia containing nonobstructed bowel loops unchanged    12. March 06, 2019. PET CT revealed 1. The RIGHT lower lobe mass has decreased in size and uptake, which is consistent with response to therapy. However, abnormal uptake remains. This is consistent with residual tumor. Suspect post radiation change in the RIGHT pneumothorax. Consolidation in the  medial RIGHT lung has tumor level uptake. Differential: Post radiation change, infection, and tumor infiltration. Additional findings are concerning for tumor infiltration. Follow-up recommended. Increased RIGHT hilar density and uptake. Concerning for tumor spread. Post radiation change less likely. 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 A low-attenuation mass in the RIGHT cardiophrenic angle may be related. Lymph node metastasis less likely.    13. October 29, 2019. CT chest revealed 1. Persistent dense consolidation in the RIGHT middle lobe. Compatible with post radiation change. Residual tumor cannot be excluded. 2. Scattered groundglass  opacities in both lungs. Favor post radiation change or infiltrate or tumor. Follow-up recommended. 3. A 5 mm nodule in the LEFT lingula is concerning for metastatic disease. Follow-up is recommended. PET/CT could be useful. Alternatively, tissue biopsy.      SUBJECTIVE:  Patient presents for follow-up and treatment.  Denies any new issues since last visit.  Denies any worsening shortness of breath or chest pain.  No fevers or chills.    REVIEW OF SYSTEMS:  GENERAL:   Reports of fevers and chills.  reports fatigue   HEENT: no sore throat, congestion, blurry vision  Lungs:  Denies any worsening shortness of breath.  Complains of cough. Reports dyspnea with exertion  GI: no nausea, vomiting, constipation, no diarrhea  GU: No dysuria, urgency, increased frequency   Cardiac:  Denies any chest pain, palpitations, syncope,   Neuro: reports weakness, loss of function, numbness, tingling  Skin: no rash  Musculoskeletal: reports myalgias and weakness  Psychosocial: No depression, anxiety  Hematologic: No easy bruising, abnormal bleeding  All other review ROS negative except those mentioned above.     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     HTN (hypertension)     Hyperlipidemia     Lung mass     with pleural effusion    Obesity     Sleep apnea     Ventral hernia        Past Surgical History:   Procedure Laterality Date    ABSCESS DRAINAGE      BOWEL RESECTION      CATARACT EXTRACTION      HX CERVICAL CONE BIOPSY       HX CESAREAN SECTION      HX GASTRIC BYPASS      25 years ago    [place] (x2)    HX HYSTERECTOMY      late 92s    HX LAP BANDING      2013    HX LAPAROTOMY      2013 to remove lap band       Family Medical History:      Problem Relation (Age of Onset)    Diabetes Mother, Maternal Grandmother, Maternal Grandfather, Father, Sister    Heart Attack Mother    Stroke Father          Current Outpatient Medications   Medication Sig    albuterol sulfate (PROVENTIL OR VENTOLIN OR PROAIR) 90 mcg/actuation Inhalation HFA Aerosol Inhaler INHALE 1 TO 2 PUFFS BY MOUTH EVERY 6 HOURS AS NEEDED  albuterol sulfate (PROVENTIL) 2.5 mg /3 mL (0.083 %) Inhalation Solution for Nebulization 3 mL (2.5 mg total) by Nebulization route Every 6 hours    apixaban (ELIQUIS) 5 mg Oral Tablet Take 1 Tab (5 mg total) by mouth Twice daily    atorvastatin (LIPITOR) 10 mg Oral Tablet Take 10 mg by mouth Once a day    BREZTRI AEROSPHERE 160-9-4.8 mcg/actuation Inhalation HFA Aerosol Inhaler INHALE 2 PUFFS BY MOUTH TWICE DAILY    carvediloL (COREG) 3.125 mg Oral Tablet Take 3.125 mg by mouth Twice daily with food    docusate sodium (COLACE) 100 mg Oral Capsule Take 100 mg by mouth Three times a day as needed     flecainide (TAMBOCOR) 100 mg Oral Tablet Take 1 Tab (100 mg total) by mouth Twice daily    guaiFENesin (MUCINEX) 600 mg Oral Tablet Extended Release 12hr Take 2 Tablets (1,200 mg total) by mouth Twice daily    levothyroxine (SYNTHROID) 112 mcg Oral Tablet Take 1 Tablet (112 mcg total) by mouth Every morning    linaCLOtide (LINZESS) 72 mcg Oral Capsule Take 72 mcg by mouth Every morning    lisinopriL (PRINIVIL) 20 mg Oral Tablet Take 20 mg by mouth Once a day    loperamide (IMODIUM) 2 mg Oral Capsule Take 2 mg by mouth Every 4 hours as needed    loratadine (CLARITIN) 10 mg Oral Tablet TAKE 1 TABLET BY MOUTH EVERY DAY    magnesium oxide (MAG-OX) 400 mg Oral Tablet Take 400 mg by mouth Once a day    omeprazole (PRILOSEC) 20 mg Oral Capsule, Delayed Release(E.C.) Take 40 mg by mouth Once a day     oxyCODONE (ROXICODONE) 5 mg Oral Tablet Take 1 Tablet (5 mg total) by mouth Every 6 hours as needed for Pain    potassium chloride (K-DUR) 20 mEq  Oral Tab Sust.Rel. Particle/Crystal Take 20 mEq by mouth Once a day Takes 3 days a week    prochlorperazine (COMPAZINE) 10 mg Oral Tablet Take 1 Tab (10 mg total) by mouth Four times a day as needed for Nausea/Vomiting    rOPINIRole (REQUIP) 0.5 mg Oral Tablet Take 0.5 mg by mouth Every night    tiotropium bromide (SPIRIVA HANDIHALER) 18 mcg Inhalation Capsule, w/Inhalation Device Take 1 Capsule (18 mcg total) by inhalation Once a day for 30 days    umeclidinium (INCRUSE ELLIPTA) 62.5 mcg/actuation Inhalation Disk with Device Take 1 INHALATION by inhalation Once a day     Social History     Socioeconomic History    Marital status: Married     Spouse name: Not on file    Number of children: Not on file    Years of education: Not on file    Highest education level: Not on file   Tobacco Use    Smoking status: Former Smoker    Smokeless tobacco: Never Used    Tobacco comment: quit 10 yrs ago   Vaping Use    Vaping Use: Never used   Substance and Sexual Activity    Alcohol use: Not Currently     Comment: rarely    Drug use: No    Sexual activity: Not Currently   Other Topics Concern    Ability to Walk 1 Flight of Steps without SOB/CP No    Ability To Do Own ADL's Yes       PHYSICAL EXAMINATION:  General Vitals: BP (!) 150/80    Pulse 70    Temp 36 C (96.8 F)  Ht 1.575 m (5' 2" )    Wt 122 kg (269 lb)    SpO2 98%    BMI 49.20 kg/m       PHYSICAL EXAM:   Consitutional: Appears fatigue  Eyes: EOMI. No discharge. No Jaundice.   ENT: mucous membranes dry. No posterior pharynx lesions.. Neck supple, No palpable masses   Heme/ Lymph: No Cervical, Inguinal, axillary lymh nodes. No Bruising.   Cardiovascular: S1, S2, No murmurs, rubs, or gallops.  Respiratory: RLL diminished sounds and otherwise coarse lung sounds noted.   Abdomen: Normal Bowel Sounds, nontender nondistended. No hepatosplenomegaly.   Musculoskeletal: No Edema to the extremities.   Skin: Normal turgor. No Rashes,skin lesions   Psychiatry:  Normal Affect   Neuro: No focal deficits. Alert and Oriented x 3      ENCOUNTER ORDERS:  No orders of the defined types were placed in this encounter.      LABORATORY/RADIOLOGICAL DATA: All pertinent labs/radiology were reviewed.   CBC  Diff   Lab Results   Component Value Date/Time    WBC 6.1 07/13/2020 08:07 AM    HGB 10.9 (L) 07/13/2020 08:07 AM    HCT 34.4 (L) 07/13/2020 08:07 AM    PLTCNT 217 07/13/2020 08:07 AM    RBC 4.79 07/13/2020 08:07 AM    MCV 71.9 (L) 07/13/2020 08:07 AM    MCHC 31.6 (L) 07/13/2020 08:07 AM    MCH 22.7 (L) 07/13/2020 08:07 AM    RDW 19.4 (H) 07/13/2020 08:07 AM    MPV 8.7 07/13/2020 08:07 AM    Lab Results   Component Value Date/Time    PMNS 70 07/13/2020 08:07 AM    LYMPHOCYTES 15 07/13/2020 08:07 AM    EOSINOPHIL 2 07/13/2020 08:07 AM    MONOCYTES 12 07/13/2020 08:07 AM    BASOPHILS 1 07/13/2020 08:07 AM    BASOPHILS 0.10 07/13/2020 08:07 AM    PMNABS 4.30 07/13/2020 08:07 AM    LYMPHSABS 0.90 07/13/2020 08:07 AM    EOSABS 0.10 07/13/2020 08:07 AM    MONOSABS 0.70 07/13/2020 08:07 AM    BASOSABS 0.10 11/10/2014 02:28 PM    BASABS 0.01 09/17/2018 08:24 AM        COMPREHENSIVE METABOLIC PANEL  Lab Results   Component Value Date    SODIUM 138 07/13/2020    POTASSIUM 3.8 07/13/2020    CHLORIDE 106 07/13/2020    CO2 26 07/13/2020    ANIONGAP 6 07/13/2020    BUN 12 07/13/2020    CREATININE 0.85 07/13/2020    GLUCOSE Negative 09/24/2018    CALCIUM 8.1 (L) 07/13/2020    PHOSPHORUS 4.2 12/14/2019    ALBUMIN 3.4 07/13/2020    TOTALPROTEIN 5.7 (L) 07/13/2020    ALKPHOS 69 07/13/2020    AST 19 07/13/2020    ALT 13 07/13/2020    BILIRUBINCON 0.2 03/22/2018     THYROID STIMULATING HORMONE  Lab Results   Component Value Date    TSH 2.310 01/22/2020         ASSESSMENT AND PLAN:  69 y.o. female with history of stage III colon cancer status post 5 cycle of FOLFOX now presenting with stage III lung cancer.      ICD-10-CM    1. Encounter for antineoplastic chemotherapy  Z51.11    2. Malignant neoplasm of  lung, unspecified laterality, unspecified part of lung (CMS HCC)  C34.90    3. Cancer related pain  G89.3        1. Adenocarcinoma lung:  Stage  III.    CT brain to rule out metastatic disease.  NEGATIVE.   Started on chemoradiation carboplatin/Taxol along with radiation- July 23, 2018. PET scan showed good response.  Will complete  immunotherapy for 1 year.   Completed 6/6 cycles of Taxol/Carboplatin and currently on maintenance durvalumab.    CHEMOTHERAPY:/RADIATION  CYCLE 19/26 DAY   DRUG DOSE TOTAL DOSE % ROUTE SCHEDULE   Carboplatin AUC- 2 205 mg    COMPLETED   Paclitaxel 45 mg/m2 108 mg    COMPLETED   Durvalumab     Q 2 wks              Scheduled for CT chest today.      Power scooter  The patient has a mobility limitation that significantly impairs his/her ability to particiapate in one or more mobility-related activities of daily living in the home; AND the mobility deficit cannot be sufficiently resolved by the use of an appropriately fitted cane or walker; AND use of a power wheelchair will significantly improve the patient's ability to participate in ADLs and the patient will use it on a regular basis in the home.     Return in about 2 weeks (around 07/27/2020) for cbc/diff, CMP, Treatment.    Vanetta Mulders, MD  Hematology/Oncology

## 2020-07-19 ENCOUNTER — Other Ambulatory Visit (HOSPITAL_COMMUNITY): Payer: Self-pay | Admitting: Nurse Practitioner

## 2020-07-26 ENCOUNTER — Other Ambulatory Visit (HOSPITAL_COMMUNITY): Payer: Self-pay | Admitting: Internal Medicine

## 2020-07-27 ENCOUNTER — Encounter (HOSPITAL_COMMUNITY): Payer: Self-pay | Admitting: Nurse Practitioner

## 2020-07-27 ENCOUNTER — Inpatient Hospital Stay (HOSPITAL_COMMUNITY): Payer: Medicare Other

## 2020-07-27 MED ORDER — OXYCODONE 5 MG TABLET
5.0000 mg | ORAL_TABLET | Freq: Four times a day (QID) | ORAL | 0 refills | Status: DC | PRN
Start: 2020-07-27 — End: 2020-08-29

## 2020-08-01 ENCOUNTER — Inpatient Hospital Stay (HOSPITAL_COMMUNITY): Payer: Medicare Other

## 2020-08-01 ENCOUNTER — Other Ambulatory Visit (HOSPITAL_COMMUNITY): Payer: Self-pay | Admitting: Nurse Practitioner

## 2020-08-01 ENCOUNTER — Encounter (HOSPITAL_COMMUNITY): Payer: Self-pay | Admitting: Nurse Practitioner

## 2020-08-01 ENCOUNTER — Ambulatory Visit (HOSPITAL_COMMUNITY): Payer: Medicare Other | Admitting: Internal Medicine

## 2020-08-01 ENCOUNTER — Ambulatory Visit (HOSPITAL_BASED_OUTPATIENT_CLINIC_OR_DEPARTMENT_OTHER): Payer: Medicare Other | Admitting: Nurse Practitioner

## 2020-08-01 ENCOUNTER — Ambulatory Visit
Admission: RE | Admit: 2020-08-01 | Discharge: 2020-08-01 | Disposition: A | Discharge status: Home / Self Care | Payer: Medicare Other | Source: Ambulatory Visit | Attending: Internal Medicine | Admitting: Internal Medicine

## 2020-08-01 ENCOUNTER — Other Ambulatory Visit: Payer: Self-pay

## 2020-08-01 VITALS — BP 144/74 | HR 72 | Temp 97.9°F | Resp 18 | Ht 62.0 in | Wt 273.0 lb

## 2020-08-01 DIAGNOSIS — R7401 Elevation of levels of liver transaminase levels: Secondary | ICD-10-CM

## 2020-08-01 DIAGNOSIS — D649 Anemia, unspecified: Secondary | ICD-10-CM | POA: Insufficient documentation

## 2020-08-01 DIAGNOSIS — G893 Neoplasm related pain (acute) (chronic): Secondary | ICD-10-CM

## 2020-08-01 DIAGNOSIS — R112 Nausea with vomiting, unspecified: Secondary | ICD-10-CM | POA: Insufficient documentation

## 2020-08-01 DIAGNOSIS — C3490 Malignant neoplasm of unspecified part of unspecified bronchus or lung: Secondary | ICD-10-CM

## 2020-08-01 DIAGNOSIS — R946 Abnormal results of thyroid function studies: Secondary | ICD-10-CM | POA: Insufficient documentation

## 2020-08-01 DIAGNOSIS — J90 Pleural effusion, not elsewhere classified: Secondary | ICD-10-CM | POA: Insufficient documentation

## 2020-08-01 DIAGNOSIS — E039 Hypothyroidism, unspecified: Secondary | ICD-10-CM

## 2020-08-01 DIAGNOSIS — C3491 Malignant neoplasm of unspecified part of right bronchus or lung: Secondary | ICD-10-CM

## 2020-08-01 DIAGNOSIS — Z79899 Other long term (current) drug therapy: Secondary | ICD-10-CM | POA: Insufficient documentation

## 2020-08-01 DIAGNOSIS — Z5111 Encounter for antineoplastic chemotherapy: Secondary | ICD-10-CM

## 2020-08-01 DIAGNOSIS — R7989 Other specified abnormal findings of blood chemistry: Secondary | ICD-10-CM | POA: Insufficient documentation

## 2020-08-01 DIAGNOSIS — Z85038 Personal history of other malignant neoplasm of large intestine: Secondary | ICD-10-CM | POA: Insufficient documentation

## 2020-08-01 DIAGNOSIS — R0602 Shortness of breath: Secondary | ICD-10-CM

## 2020-08-01 DIAGNOSIS — T451X5D Adverse effect of antineoplastic and immunosuppressive drugs, subsequent encounter: Secondary | ICD-10-CM

## 2020-08-01 DIAGNOSIS — T451X5A Adverse effect of antineoplastic and immunosuppressive drugs, initial encounter: Secondary | ICD-10-CM | POA: Insufficient documentation

## 2020-08-01 DIAGNOSIS — R059 Cough, unspecified: Secondary | ICD-10-CM

## 2020-08-01 LAB — COMPREHENSIVE METABOLIC PANEL, NON-FASTING
ALBUMIN: 3.4 g/dL (ref 3.2–4.6)
ALKALINE PHOSPHATASE: 78 U/L (ref 20–130)
ALT (SGPT): 24 U/L (ref <=52)
ANION GAP: 4 mmol/L
AST (SGOT): 27 U/L (ref <=35)
BILIRUBIN TOTAL: 0.3 mg/dL (ref 0.3–1.2)
BUN/CREA RATIO: 15
BUN: 12 mg/dL (ref 10–25)
CALCIUM: 8.4 mg/dL — ABNORMAL LOW (ref 8.8–10.3)
CHLORIDE: 106 mmol/L (ref 98–111)
CO2 TOTAL: 26 mmol/L (ref 21–35)
CREATININE: 0.78 mg/dL (ref <=1.30)
ESTIMATED GFR: >60 mL/min/{1.73_m2}
GLUCOSE: 109 mg/dL (ref 70–110)
POTASSIUM: 4.3 mmol/L (ref 3.5–5.0)
PROTEIN TOTAL: 6 g/dL (ref 6.0–8.3)
SODIUM: 136 mmol/L (ref 135–145)

## 2020-08-01 LAB — CBC WITH DIFF
BASOPHIL #: 0.1 10*3/uL (ref 0.00–0.20)
BASOPHIL %: 1 %
EOSINOPHIL #: 0.2 10*3/uL (ref 0.00–0.50)
EOSINOPHIL %: 3 %
HCT: 33.7 % — ABNORMAL LOW (ref 34.6–46.2)
HGB: 10.9 g/dL — ABNORMAL LOW (ref 11.8–15.8)
LYMPHOCYTE #: 1.2 10*3/uL (ref 0.90–3.40)
LYMPHOCYTE %: 20 %
MCH: 22.9 pg — ABNORMAL LOW (ref 27.6–33.2)
MCHC: 32.4 g/dL — ABNORMAL LOW (ref 32.6–35.4)
MCV: 70.8 fL — ABNORMAL LOW (ref 82.3–96.7)
MONOCYTE #: 0.7 10*3/uL (ref 0.20–0.90)
MONOCYTE %: 12 %
MPV: 7.7 fL (ref 6.6–10.2)
NEUTROPHIL #: 3.9 10*3/uL (ref 1.50–6.40)
NEUTROPHIL %: 64 %
PLATELETS: 266 10*3/uL (ref 140–440)
RBC: 4.76 10*6/uL (ref 3.80–5.24)
RDW: 19.1 % — ABNORMAL HIGH (ref 12.4–15.2)
WBC: 6.1 10*3/uL (ref 3.5–10.3)

## 2020-08-01 LAB — IRON TRANSFERRIN AND TIBC
IRON (TRANSFERRIN) SATURATION: 6 % — ABNORMAL LOW (ref 20–50)
IRON: 31 ug/dL — ABNORMAL LOW (ref 60–140)
TOTAL IRON BINDING CAPACITY: 496 ug/dL — ABNORMAL HIGH (ref 250–425)
TRANSFERRIN: 354 mg/dL (ref 200–360)

## 2020-08-01 LAB — VITAMIN B12: VITAMIN B 12: 183 pg/mL (ref 180–914)

## 2020-08-01 MED ORDER — CYANOCOBALAMIN (VIT B-12) 1,000 MCG TABLET
1000.0000 ug | ORAL_TABLET | Freq: Every day | ORAL | 3 refills | Status: DC
Start: 2020-08-01 — End: 2020-10-27

## 2020-08-01 MED ORDER — FERROUS SULFATE 325 MG (65 MG IRON) TABLET
325.0000 mg | ORAL_TABLET | Freq: Every day | ORAL | 2 refills | Status: AC
Start: 2020-08-01 — End: ?

## 2020-08-01 NOTE — Progress Notes (Addendum)
[place] [id number]        Encounter Date: 08/01/2020   8:45 AM EST    Name:  Kelsey [name]  Age: 69 y.o.  DOB: 1951/07/13  Sex: female  PCP: [facility]    Chief Complaint:    Chief Complaint   Patient presents with    Treatment     HISTORY OF PRESENT ILLNESS:   69 y.o. female with history of stage III colon cancer of present evaluation and management of lung cancer.    1. September 2015. Patient underwent sigmoidoscopy and was found to have polyp which was adenocarcinoma.    2. June 10, 2014. She was admitted for segmental resection of sigmoid colon. This revealed pT3, N1a, MX, low-grade  adenocarcinoma of the colon. The mass measuring 4 x 3 x 0.5 centimeters. It  was low-grade and there was no evidence of microsatellite instability by  histology. Margins were uninvolved and 4 lymph nodes only were removed which  only 1 was involved. This corresponding stage IIIB, T3, N1a, M0, low-grade  sigmoid cancer.     3. July 07, 2014. Recommended adjuvant chemotherapy with FOLFOX.    4. July 27, 2014. Patient was started on adjuvant chemotherapy with FOLFOX.  Patient received only 5 treatments and chemotherapy was stopped due to poor tolerance.  After that patient lost follow-up.      5. October 2019. Patient was admitted to hospital with concern for  bowel obstruction.  CT chest showed lung nodules.  Patient was managed conservatively and discharged home.    6. May 06, 2018.  Patient underwent CT-guided biopsy of lung lesion.  Pathology showed well-differentiated adenocarcinoma.  Section shows well-differentiated adenocarcinoma which positive for CK 7 and TTF 1, negative for P 63 and CK 20.     7. June 23, 2018. PET scan showed Hypermetabolic right lower lobe pulmonary mass compatible with malignancy.  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 Hepatomegaly and  steatosis.     8. July 23, 2018. Patient was started on chemoradiation.    9. October 30, 2018. PET scan showed Hypermetabolic right lower lobe mass with abnormal mediastinal and hilar nodes. There has been only mild improvement since earlier exam.     10. October 31, 2018. Patient was started on maintenance immunotherapy with durvalumab.    11. February 17, 2019. CT chest revealed Moderate right effusion new from 12/19/2018. No change in the right lower lung mass or a presumed postradiation changes in the right upper lung when compared to 12/19/2018. Mass in the right cardiophrenic angle increased from 10/30/2019 6). Consider an enlarged lymph node here. This is unchanged from 12/19/2018 but increased since size from 10/30/2018. Another possibility is an enlarging pericardial cyst (this region was not hypermetabolic 04/12/2352). Mild enlargement of the left adrenal gland stable from 10/30/2018. Approximate 9 cm upper abdominal ventral hernia containing nonobstructed bowel loops unchanged    12. March 06, 2019. PET CT revealed 1. The RIGHT lower lobe mass has decreased in size and uptake, which is consistent with response to therapy. However, abnormal uptake remains. This is consistent with residual tumor. Suspect post radiation change in the RIGHT pneumothorax. Consolidation in the  medial RIGHT lung has tumor level uptake. Differential: Post radiation change, infection, and tumor infiltration. Additional findings are concerning for tumor infiltration. Follow-up recommended. Increased RIGHT hilar density and uptake. Concerning for tumor spread. Post radiation change less likely. 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 A low-attenuation mass in the RIGHT cardiophrenic angle may be related. Lymph node metastasis less likely.    13. October 29, 2019. CT chest revealed 1. Persistent dense consolidation in the RIGHT middle lobe. Compatible with post radiation change. Residual tumor cannot be excluded. 2. Scattered groundglass opacities  in both lungs. Favor post radiation change or infiltrate or tumor. Follow-up recommended. 3. A 5 mm nodule in the LEFT lingula is concerning for metastatic disease. Follow-up is recommended. PET/CT could be useful. Alternatively, tissue biopsy.    14. July 13, 2020. CT Chest reveals 1. Interval increase in size of a small partially loculated right-sided pleural effusion. Pneumothorax component has resolved. 2. Interval increase in size of a left upper lobe pulmonary nodule. Findings concerning for disease progression. 3. Interval resolution of the left lung infiltrate compared to the prior study. 4. Posttreatment changes redemonstrated along the right hilar region.       SUBJECTIVE:  Patient presents for follow-up and treatment.  Patient reports that she has been doing okay in the interim.  Patient does report some persistent wheezing but states it is not changed in nature.  Patient denies any new headache blurred vision dizziness.  Patient denies any new bone pain.  Patient does feel like she is pretty deconditioned and also very fatigued.    REVIEW OF SYSTEMS:  GENERAL:   Reports of fevers and chills.  reports fatigue   HEENT: no sore throat, congestion, blurry vision  Lungs:  Denies any worsening shortness of breath.  Complains of dry cough. Reports dyspnea with exertion  GI: no nausea, vomiting, constipation, no diarrhea  GU: No dysuria, urgency, increased frequency   Cardiac:  Denies any chest pain, palpitations, syncope,   Neuro: reports weakness, loss of function, numbness, tingling  Skin: no rash  Musculoskeletal: reports myalgias and weakness  Psychosocial: No depression, anxiety  Hematologic: No easy bruising, abnormal bleeding  All other review ROS negative except those mentioned above.     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     HTN (hypertension)     Hyperlipidemia     Lung mass     with pleural effusion    Obesity     Sleep apnea     Ventral hernia        Past Surgical History:   Procedure Laterality Date    ABSCESS DRAINAGE      BOWEL RESECTION      CATARACT EXTRACTION      HX CERVICAL CONE BIOPSY       HX CESAREAN SECTION      HX GASTRIC BYPASS      25 years ago    [place] (x2)    HX HYSTERECTOMY      late 2s    HX LAP BANDING      2013  HX LAPAROTOMY      2013 to remove lap band       Family Medical History:       Problem Relation (Age of Onset)    Diabetes Mother, Maternal Grandmother, Maternal Grandfather, Father, Sister    Heart Attack Mother    Stroke Father            Current Outpatient Medications   Medication Sig    albuterol sulfate (PROVENTIL OR VENTOLIN OR PROAIR) 90 mcg/actuation Inhalation HFA Aerosol Inhaler INHALE 1 TO 2 PUFFS BY MOUTH EVERY 6 HOURS AS NEEDED    albuterol sulfate (PROVENTIL) 2.5 mg /3 mL (0.083 %) Inhalation Solution for Nebulization 3 mL (2.5 mg total) by Nebulization route Every 6 hours    apixaban (ELIQUIS) 5 mg Oral Tablet Take 1 Tab (5 mg total) by mouth Twice daily    atorvastatin (LIPITOR) 10 mg Oral Tablet Take 10 mg by mouth Once a day    BREZTRI AEROSPHERE 160-9-4.8 mcg/actuation Inhalation HFA Aerosol Inhaler INHALE 2 PUFFS BY MOUTH TWICE DAILY    carvediloL (COREG) 3.125 mg Oral Tablet Take 3.125 mg by mouth Twice daily with food    docusate sodium (COLACE) 100 mg Oral Capsule Take 100 mg by mouth Three times a day as needed     flecainide (TAMBOCOR) 100 mg Oral Tablet Take 1 Tab (100 mg total) by mouth Twice daily    guaiFENesin (MUCINEX) 600 mg Oral Tablet Extended Release 12hr Take 2 Tablets (1,200 mg total) by mouth Twice daily    levothyroxine (SYNTHROID) 112 mcg Oral Tablet Take 1 Tablet (112 mcg total) by mouth Every morning    linaCLOtide  (LINZESS) 72 mcg Oral Capsule Take 72 mcg by mouth Every morning    lisinopriL (PRINIVIL) 20 mg Oral Tablet Take 20 mg by mouth Once a day    loperamide (IMODIUM) 2 mg Oral Capsule Take 2 mg by mouth Every 4 hours as needed    loratadine (CLARITIN) 10 mg Oral Tablet TAKE 1 TABLET BY MOUTH EVERY DAY    magnesium oxide (MAG-OX) 400 mg Oral Tablet Take 400 mg by mouth Once a day    omeprazole (PRILOSEC) 20 mg Oral Capsule, Delayed Release(E.C.) Take 40 mg by mouth Once a day     oxyCODONE (ROXICODONE) 5 mg Oral Tablet Take 1 Tablet (5 mg total) by mouth Every 6 hours as needed for Pain    potassium chloride (K-DUR) 20 mEq Oral Tab Sust.Rel. Particle/Crystal Take 20 mEq by mouth Once a day Takes 3 days a week    prochlorperazine (COMPAZINE) 10 mg Oral Tablet Take 1 Tab (10 mg total) by mouth Four times a day as needed for Nausea/Vomiting    rOPINIRole (REQUIP) 0.5 mg Oral Tablet Take 0.5 mg by mouth Every night    tiotropium bromide (SPIRIVA HANDIHALER) 18 mcg Inhalation Capsule, w/Inhalation Device Take 1 Capsule (18 mcg total) by inhalation Once a day for 30 days    umeclidinium (INCRUSE ELLIPTA) 62.5 mcg/actuation Inhalation Disk with Device Take 1 INHALATION by inhalation Once a day     Social History     Socioeconomic History    Marital status: Married     Spouse name: Not on file    Number of children: Not on file    Years of education: Not on file    Highest education level: Not on file   Tobacco Use    Smoking status: Former Smoker    Smokeless tobacco: Never Used  Tobacco comment: quit 10 yrs ago   Vaping Use    Vaping Use: Never used   Substance and Sexual Activity    Alcohol use: Not Currently     Comment: rarely    Drug use: No    Sexual activity: Not Currently   Other Topics Concern    Ability to Walk 1 Flight of Steps without SOB/CP No    Ability To Do Own ADL's Yes       PHYSICAL EXAMINATION:  General Vitals: BP (!) 144/74    Pulse 72    Temp 36.6 C (97.9 F)    Resp 18    Ht  1.575 m (5' 2")    Wt 124 kg (273 lb)    SpO2 100%    BMI 49.93 kg/m       PHYSICAL EXAM:   Consitutional: Appears fatigue  Eyes: EOMI. No discharge. No Jaundice.   ENT: mucous membranes dry. No posterior pharynx lesions.. Neck supple, No palpable masses   Heme/ Lymph: No Cervical, Inguinal, axillary lymh nodes. No Bruising.   Cardiovascular: S1, S2, No murmurs, rubs, or gallops.  Respiratory: RLL diminished sounds and otherwise coarse lung sounds noted.   Abdomen: Normal Bowel Sounds, nontender nondistended. No hepatosplenomegaly.   Musculoskeletal: No Edema to the extremities.   Skin: Normal turgor. No Rashes,skin lesions   Psychiatry: Normal Affect   Neuro: No focal deficits. Alert and Oriented x 3      ENCOUNTER ORDERS:  Orders Placed This Encounter    IRON    FERRITIN    IRON TRANSFERRIN AND TIBC    VITAMIN B12    ADDITIONAL TESTING  REQUEST    CARIS MI PROFILE       LABORATORY/RADIOLOGICAL DATA: All pertinent labs/radiology were reviewed.   CBC  Diff   Lab Results   Component Value Date/Time    WBC 6.1 08/01/2020 08:33 AM    HGB 10.9 (L) 08/01/2020 08:33 AM    HCT 33.7 (L) 08/01/2020 08:33 AM    PLTCNT 266 08/01/2020 08:33 AM    RBC 4.76 08/01/2020 08:33 AM    MCV 70.8 (L) 08/01/2020 08:33 AM    MCHC 32.4 (L) 08/01/2020 08:33 AM    MCH 22.9 (L) 08/01/2020 08:33 AM    RDW 19.1 (H) 08/01/2020 08:33 AM    MPV 7.7 08/01/2020 08:33 AM    Lab Results   Component Value Date/Time    PMNS 64 08/01/2020 08:33 AM    LYMPHOCYTES 20 08/01/2020 08:33 AM    EOSINOPHIL 3 08/01/2020 08:33 AM    MONOCYTES 12 08/01/2020 08:33 AM    BASOPHILS 1 08/01/2020 08:33 AM    BASOPHILS 0.10 08/01/2020 08:33 AM    PMNABS 3.90 08/01/2020 08:33 AM    LYMPHSABS 1.20 08/01/2020 08:33 AM    EOSABS 0.20 08/01/2020 08:33 AM    MONOSABS 0.70 08/01/2020 08:33 AM    BASOSABS 0.10 11/10/2014 02:28 PM    BASABS 0.01 09/17/2018 08:24 AM        COMPREHENSIVE METABOLIC PANEL  Lab Results   Component Value Date    SODIUM 136 08/01/2020    POTASSIUM  4.3 08/01/2020    CHLORIDE 106 08/01/2020    CO2 26 08/01/2020    ANIONGAP 4 08/01/2020    BUN 12 08/01/2020    CREATININE 0.78 08/01/2020    GLUCOSE Negative 09/24/2018    CALCIUM 8.4 (L) 08/01/2020    PHOSPHORUS 4.2 12/14/2019    ALBUMIN 3.4 08/01/2020    TOTALPROTEIN  6.0 08/01/2020    ALKPHOS 78 08/01/2020    AST 27 08/01/2020    ALT 24 08/01/2020    BILIRUBINCON 0.2 03/22/2018     THYROID STIMULATING HORMONE  Lab Results   Component Value Date    TSH 2.310 01/22/2020         ASSESSMENT AND PLAN:  69 y.o. female with history of stage III colon cancer status post 5 cycle of FOLFOX now presenting with stage III lung cancer.      ICD-10-CM    1. Malignant neoplasm of lung, unspecified laterality, unspecified part of lung (CMS HCC)  C34.90 CARIS MI PROFILE   2. Anemia, unspecified type  D64.9 IRON     FERRITIN     IRON TRANSFERRIN AND TIBC     VITAMIN B12     ADDITIONAL TESTING  REQUEST   3. Cancer related pain  G89.3    4. Encounter for antineoplastic chemotherapy  Z51.11    5. SOB (shortness of breath)  R06.02    6. Hypothyroidism, unspecified type  E03.9    7. Transaminitis  R74.01    8. Cough  R05.9    9. Malignant neoplasm of unspecified part of right bronchus or lung (CMS HCC)   C34.91    10. Chemotherapy induced nausea and vomiting  R11.2     T45.1X5A    11. Pleural effusion  J90    12. Elevated TSH  R79.89    13. Abnormal results of thyroid function studies   R94.6        1. Adenocarcinoma lung:  Stage III.    CT brain to rule out metastatic disease.  NEGATIVE.   Started on chemoradiation carboplatin/Taxol along with radiation- July 23, 2018. PET scan showed good response.  Will complete  immunotherapy for 1 year.   Completed 6/6 cycles of Taxol/Carboplatin and currently on maintenance durvalumab.    CHEMOTHERAPY:/RADIATION:  Hold  CYCLE 19/26 DAY   DRUG DOSE TOTAL DOSE % ROUTE SCHEDULE   Carboplatin AUC- 2 205 mg    COMPLETED   Paclitaxel 45 mg/m2 108 mg    COMPLETED   Durvalumab     Q 2 wks               CT scan reveals progression.  Will order PET-CT.  Also Caris profile testing was sent      Anemia  - sent iron study and B12    Will start oral B12 and iron tablets.    Return in about 3 weeks (around 08/22/2020) for cbc/diff, CMP, EXAM, Scans.      Omer Jack, APRN, FNP-C  Hematology/Oncology     The patient was seen independently by the Nurse Practitioner. I reviewed the patient's chart and agree with the NP's plan.     Vanetta Mulders, MD  Hematology/Oncology

## 2020-08-02 ENCOUNTER — Telehealth (HOSPITAL_COMMUNITY): Payer: Self-pay | Admitting: Internal Medicine

## 2020-08-02 NOTE — Telephone Encounter (Signed)
Called patient with appointment date and time for PET scan scheduled 08/11/20 @ 2pm, appt confirmed, all questions answered. Appointment letter and proper instructions mailed to patient.  Kelsey [name], [place]

## 2020-08-11 ENCOUNTER — Ambulatory Visit (HOSPITAL_COMMUNITY): Payer: Medicare Other | Admitting: Radiology

## 2020-08-15 ENCOUNTER — Encounter (HOSPITAL_COMMUNITY): Payer: Self-pay | Admitting: Internal Medicine

## 2020-08-18 ENCOUNTER — Other Ambulatory Visit (HOSPITAL_COMMUNITY): Payer: Self-pay | Admitting: Nurse Practitioner

## 2020-08-18 ENCOUNTER — Other Ambulatory Visit: Payer: Self-pay

## 2020-08-18 ENCOUNTER — Ambulatory Visit
Admission: RE | Admit: 2020-08-18 | Discharge: 2020-08-18 | Disposition: A | Discharge status: Home / Self Care | Payer: Medicare Other | Source: Ambulatory Visit | Attending: Nurse Practitioner | Admitting: Nurse Practitioner

## 2020-08-18 DIAGNOSIS — C3490 Malignant neoplasm of unspecified part of unspecified bronchus or lung: Secondary | ICD-10-CM

## 2020-08-18 LAB — POC BLOOD GLUCOSE (RESULTS): GLUCOSE, POC: 107 mg/dL (ref 70–110)

## 2020-08-18 LAB — HISTORIC CASE ADDENDUM/AMENDMENT

## 2020-08-22 ENCOUNTER — Encounter (HOSPITAL_COMMUNITY): Payer: Self-pay | Admitting: Internal Medicine

## 2020-08-24 ENCOUNTER — Other Ambulatory Visit (HOSPITAL_COMMUNITY): Payer: Self-pay | Admitting: Internal Medicine

## 2020-08-24 DIAGNOSIS — C3490 Malignant neoplasm of unspecified part of unspecified bronchus or lung: Secondary | ICD-10-CM

## 2020-08-25 ENCOUNTER — Other Ambulatory Visit: Payer: Self-pay

## 2020-08-25 ENCOUNTER — Ambulatory Visit (HOSPITAL_BASED_OUTPATIENT_CLINIC_OR_DEPARTMENT_OTHER): Payer: Medicare Other | Admitting: Internal Medicine

## 2020-08-25 ENCOUNTER — Encounter (HOSPITAL_COMMUNITY): Payer: Self-pay | Admitting: Internal Medicine

## 2020-08-25 ENCOUNTER — Ambulatory Visit
Admission: RE | Admit: 2020-08-25 | Discharge: 2020-08-25 | Disposition: A | Discharge status: Home / Self Care | Payer: Medicare Other | Source: Ambulatory Visit | Attending: Internal Medicine | Admitting: Internal Medicine

## 2020-08-25 VITALS — BP 160/89 | HR 70 | Temp 98.7°F | Resp 18 | Ht 62.0 in | Wt 273.0 lb

## 2020-08-25 DIAGNOSIS — C3490 Malignant neoplasm of unspecified part of unspecified bronchus or lung: Secondary | ICD-10-CM | POA: Insufficient documentation

## 2020-08-25 DIAGNOSIS — R5383 Other fatigue: Secondary | ICD-10-CM

## 2020-08-25 DIAGNOSIS — C3491 Malignant neoplasm of unspecified part of right bronchus or lung: Secondary | ICD-10-CM

## 2020-08-25 DIAGNOSIS — D649 Anemia, unspecified: Secondary | ICD-10-CM

## 2020-08-25 LAB — COMPREHENSIVE METABOLIC PANEL, NON-FASTING
ALBUMIN: 3.5 g/dL (ref 3.2–4.6)
ALKALINE PHOSPHATASE: 79 U/L (ref 20–130)
ALT (SGPT): 20 U/L (ref <=52)
ANION GAP: 5 mmol/L
AST (SGOT): 22 U/L (ref <=35)
BILIRUBIN TOTAL: 0.4 mg/dL (ref 0.3–1.2)
BUN/CREA RATIO: 19
BUN: 15 mg/dL (ref 10–25)
CALCIUM: 7.9 mg/dL — ABNORMAL LOW (ref 8.8–10.3)
CHLORIDE: 108 mmol/L (ref 98–111)
CO2 TOTAL: 25 mmol/L (ref 21–35)
CREATININE: 0.8 mg/dL (ref <=1.30)
ESTIMATED GFR: >60 mL/min/{1.73_m2}
GLUCOSE: 104 mg/dL (ref 70–110)
POTASSIUM: 3.9 mmol/L (ref 3.5–5.0)
PROTEIN TOTAL: 5.9 g/dL — ABNORMAL LOW (ref 6.0–8.3)
SODIUM: 138 mmol/L (ref 135–145)

## 2020-08-25 LAB — CBC WITH DIFF
BASOPHIL #: <0.1 10*3/uL (ref <=0.20)
BASOPHIL %: 1 %
EOSINOPHIL #: 0.15 10*3/uL (ref <=0.50)
EOSINOPHIL %: 3 %
HCT: 35.6 % (ref 34.8–46.0)
HGB: 10.3 g/dL — ABNORMAL LOW (ref 11.5–16.0)
IMMATURE GRANULOCYTE #: <0.1 10*3/uL (ref <0.10)
IMMATURE GRANULOCYTE %: 1 % (ref 0–1)
LYMPHOCYTE #: 1.13 10*3/uL (ref 1.00–4.80)
LYMPHOCYTE %: 19 %
MCH: 21.8 pg — ABNORMAL LOW (ref 26.0–32.0)
MCHC: 28.9 g/dL — ABNORMAL LOW (ref 31.0–35.5)
MCV: 75.4 fL — ABNORMAL LOW (ref 78.0–100.0)
MONOCYTE #: 0.86 10*3/uL (ref 0.20–1.10)
MONOCYTE %: 15 %
MPV: 9.3 fL (ref 8.7–12.5)
NEUTROPHIL #: 3.71 10*3/uL (ref 1.50–7.70)
NEUTROPHIL %: 61 %
PLATELETS: 251 10*3/uL (ref 150–400)
RBC: 4.72 10*6/uL (ref 3.85–5.22)
RDW-CV: 18.4 % — ABNORMAL HIGH (ref 11.5–15.5)
WBC: 5.9 10*3/uL (ref 3.7–11.0)

## 2020-08-25 NOTE — Progress Notes (Signed)
[place]  [place] [id number]        Encounter Date: 08/25/2020   9:00 AM EST    Name:  Kelsey [name]  Age: 70 y.o.  DOB: 07-03-1951  Sex: female  PCP: [facility]    Chief Complaint:    Chief Complaint   Patient presents with   . Follow-up     HISTORY OF PRESENT ILLNESS:   70 y.o. female with history of stage III colon cancer of present evaluation and management of lung cancer.    1. September 2015. Patient underwent sigmoidoscopy and was found to have polyp which was adenocarcinoma.    2. June 10, 2014. She was admitted for segmental resection of sigmoid colon. This revealed pT3, N1a, MX, low-grade  adenocarcinoma of the colon. The mass measuring 4 x 3 x 0.5 centimeters. It  was low-grade and there was no evidence of microsatellite instability by  histology. Margins were uninvolved and 4 lymph nodes only were removed which  only 1 was involved. This corresponding stage IIIB, T3, N1a, M0, low-grade  sigmoid cancer.     3. July 07, 2014. Recommended adjuvant chemotherapy with FOLFOX.    4. July 27, 2014. Patient was started on adjuvant chemotherapy with FOLFOX.  Patient received only 5 treatments and chemotherapy was stopped due to poor tolerance.  After that patient lost follow-up.      5. October 2019. Patient was admitted to hospital with concern for  bowel obstruction.  CT chest showed lung nodules.  Patient was managed conservatively and discharged home.    6. May 06, 2018.  Patient underwent CT-guided biopsy of lung lesion.  Pathology showed well-differentiated adenocarcinoma.  Section shows well-differentiated adenocarcinoma which positive for CK 7 and TTF 1, negative for P 63 and CK 20.     7. June 23, 2018. PET scan showed Hypermetabolic right lower lobe pulmonary mass compatible with malignancy.  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 Hepatomegaly and  steatosis.     8. July 23, 2018. Patient was started on chemoradiation.    9. October 30, 2018. PET scan showed Hypermetabolic right lower lobe mass with abnormal mediastinal and hilar nodes. There has been only mild improvement since earlier exam.     10. October 31, 2018. Patient was started on maintenance immunotherapy with durvalumab.    11. February 17, 2019. CT chest revealed Moderate right effusion new from 12/19/2018. No change in the right lower lung mass or a presumed postradiation changes in the right upper lung when compared to 12/19/2018. Mass in the right cardiophrenic angle increased from 10/30/2019 6). Consider an enlarged lymph node here. This is unchanged from 12/19/2018 but increased since size from 10/30/2018. Another possibility is an enlarging pericardial cyst (this region was not hypermetabolic 2/97/9892). Mild enlargement of the left adrenal gland stable from 10/30/2018. Approximate 9 cm upper abdominal ventral hernia containing nonobstructed bowel loops unchanged    12. March 06, 2019. PET CT revealed 1. The RIGHT lower lobe mass has decreased in size and uptake, which is consistent with response to therapy. However, abnormal uptake remains. This is consistent with residual tumor. Suspect post radiation change in the RIGHT pneumothorax. Consolidation in the medial  RIGHT lung has tumor level uptake. Differential: Post radiation change, infection, and tumor infiltration. Additional findings are concerning for tumor infiltration. Follow-up recommended. Increased RIGHT hilar density and uptake. Concerning for tumor spread. Post radiation change less likely. 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 A low-attenuation mass in the RIGHT cardiophrenic angle may be related. Lymph node metastasis less likely.    13. October 29, 2019. CT chest revealed 1. Persistent dense consolidation in the RIGHT middle lobe. Compatible with post radiation change. Residual tumor cannot be excluded. 2. Scattered groundglass opacities  in both lungs. Favor post radiation change or infiltrate or tumor. Follow-up recommended. 3. A 5 mm nodule in the LEFT lingula is concerning for metastatic disease. Follow-up is recommended. PET/CT could be useful. Alternatively, tissue biopsy.    14. July 13, 2020. CT Chest reveals 1. Interval increase in size of a small partially loculated right-sided pleural effusion. Pneumothorax component has resolved. 2. Interval increase in size of a left upper lobe pulmonary nodule. Findings concerning for disease progression. 3. Interval resolution of the left lung infiltrate compared to the prior study. 4. Posttreatment changes redemonstrated along the right hilar region.     08/25/20 Established care with Dr Jolee Ewing     SUBJECTIVE:  Patient presents for follow-up and treatment.  Patient reports that she has been doing okay in the interim.  Patient reports persistent fatigue.  She is on baseline 2 L oxygen.  Patient denies any new headache blurred vision dizziness.  Patient denies any new bone pain.  She is currently in a wheelchair.  Patient canceled her PET scan last week due to claustrophobia.     REVIEW OF SYSTEMS:  GENERAL:   Reports of fevers and chills.  reports fatigue   HEENT: no sore throat, congestion, blurry vision  Lungs:  Denies any worsening shortness of breath.  Complains of dry cough. Reports dyspnea with exertion  GI: no nausea, vomiting, constipation, no diarrhea  GU: No dysuria, urgency, increased frequency   Cardiac:  Denies any chest pain, palpitations, syncope,   Neuro: reports weakness, loss of function, numbness, tingling  Skin: no rash  Musculoskeletal: reports myalgias and weakness  Psychosocial: No depression, anxiety  Hematologic: No easy bruising, abnormal bleeding  All other review ROS negative except those mentioned above.     PATIENT HISTORY:  Past Medical History:   Diagnosis Date   . A-fib (CMS HCC)    . Arthropathy, unspecified, site unspecified    . Asthma    . Cancer (CMS HCC)      cervical   . Cataract    . Colon cancer (CMS [place]) 07/06/2014   . COPD (chronic obstructive pulmonary disease) (CMS HCC)    . Fistula    . HTN (hypertension)    . Hyperlipidemia    . Lung mass     with pleural effusion   . Obesity    . Sleep apnea    . Ventral hernia        Past Surgical History:   Procedure Laterality Date   . ABSCESS DRAINAGE     . BOWEL RESECTION     . CATARACT EXTRACTION     . HX CERVICAL CONE BIOPSY      . HX CESAREAN SECTION     . HX GASTRIC BYPASS      25 years ago   . [place] (x2)   . HX HYSTERECTOMY      late 1990s   .  HX LAP BANDING      2013   . HX LAPAROTOMY      2013 to remove lap band       Family Medical History:     Problem Relation (Age of Onset)    Diabetes Mother, Maternal Grandmother, Maternal Grandfather, Father, Sister    Heart Attack Mother    Stroke Father          Current Outpatient Medications   Medication Sig   . albuterol sulfate (PROVENTIL OR VENTOLIN OR PROAIR) 90 mcg/actuation Inhalation HFA Aerosol Inhaler INHALE 1 TO 2 PUFFS BY MOUTH EVERY 6 HOURS AS NEEDED   . albuterol sulfate (PROVENTIL) 2.5 mg /3 mL (0.083 %) Inhalation Solution for Nebulization 3 mL (2.5 mg total) by Nebulization route Every 6 hours   . apixaban (ELIQUIS) 5 mg Oral Tablet Take 1 Tab (5 mg total) by mouth Twice daily   . atorvastatin (LIPITOR) 10 mg Oral Tablet Take 10 mg by mouth Once a day   . BREZTRI AEROSPHERE 160-9-4.8 mcg/actuation Inhalation HFA Aerosol Inhaler INHALE 2 PUFFS BY MOUTH TWICE DAILY   . carvediloL (COREG) 3.125 mg Oral Tablet Take 3.125 mg by mouth Twice daily with food   . cyanocobalamin (VITAMIN B 12) 1,000 mcg Oral Tablet Take 1 Tablet (1,000 mcg total) by mouth Once a day   . docusate sodium (COLACE) 100 mg Oral Capsule Take 100 mg by mouth Three times a day as needed    . ferrous sulfate (FEOSOL) 325 mg (65 mg iron) Oral Tablet Take 1 Tablet (325 mg total) by mouth Once a day   . flecainide (TAMBOCOR) 100 mg Oral Tablet Take 1 Tab (100 mg  total) by mouth Twice daily   . guaiFENesin (MUCINEX) 600 mg Oral Tablet Extended Release 12hr Take 2 Tablets (1,200 mg total) by mouth Twice daily   . levothyroxine (SYNTHROID) 112 mcg Oral Tablet Take 1 Tablet (112 mcg total) by mouth Every morning   . linaCLOtide (LINZESS) 72 mcg Oral Capsule Take 72 mcg by mouth Every morning   . lisinopriL (PRINIVIL) 20 mg Oral Tablet Take 20 mg by mouth Once a day   . loperamide (IMODIUM) 2 mg Oral Capsule Take 2 mg by mouth Every 4 hours as needed   . loratadine (CLARITIN) 10 mg Oral Tablet TAKE 1 TABLET BY MOUTH EVERY DAY   . magnesium oxide (MAG-OX) 400 mg Oral Tablet Take 400 mg by mouth Once a day   . omeprazole (PRILOSEC) 20 mg Oral Capsule, Delayed Release(E.C.) Take 40 mg by mouth Once a day    . oxyCODONE (ROXICODONE) 5 mg Oral Tablet Take 1 Tablet (5 mg total) by mouth Every 6 hours as needed for Pain   . potassium chloride (K-DUR) 20 mEq Oral Tab Sust.Rel. Particle/Crystal Take 20 mEq by mouth Once a day Takes 3 days a week   . prochlorperazine (COMPAZINE) 10 mg Oral Tablet Take 1 Tab (10 mg total) by mouth Four times a day as needed for Nausea/Vomiting   . rOPINIRole (REQUIP) 0.5 mg Oral Tablet Take 0.5 mg by mouth Every night   . tiotropium bromide (SPIRIVA HANDIHALER) 18 mcg Inhalation Capsule, w/Inhalation Device Take 1 Capsule (18 mcg total) by inhalation Once a day for 30 days   . umeclidinium (INCRUSE ELLIPTA) 62.5 mcg/actuation Inhalation Disk with Device Take 1 INHALATION by inhalation Once a day     Social History     Socioeconomic History   . Marital status: Married  Spouse name: Not on file   . Number of children: Not on file   . Years of education: Not on file   . Highest education level: Not on file   Tobacco Use   . Smoking status: Former Research scientist (life sciences)   . Smokeless tobacco: Never Used   . Tobacco comment: quit 10 yrs ago   Vaping Use   . Vaping Use: Never used   Substance and Sexual Activity   . Alcohol use: Not Currently     Comment: rarely   . Drug  use: No   . Sexual activity: Not Currently   Other Topics Concern   . Ability to Walk 1 Flight of Steps without SOB/CP No   . Ability To Do Own ADL's Yes       PHYSICAL EXAMINATION:  General Vitals: BP (!) 160/89   Pulse 70   Temp 37.1 C (98.7 F)   Resp 18   Ht 1.575 m (5' 2" )   Wt 124 kg (273 lb)   SpO2 100%   BMI 49.93 kg/m       PHYSICAL EXAM:   Consitutional: Appears fatigue  Eyes: EOMI. No discharge. No Jaundice.   ENT: mucous membranes dry. No posterior pharynx lesions.. Neck supple, No palpable masses   Heme/ Lymph: No Cervical, Inguinal, axillary lymh nodes. No Bruising.   Cardiovascular: S1, S2, No murmurs, rubs, or gallops.  Respiratory: RLL diminished sounds and otherwise coarse lung sounds noted.   Abdomen: Normal Bowel Sounds, nontender nondistended. No hepatosplenomegaly.   Musculoskeletal: No Edema to the extremities.   Skin: Normal turgor. No Rashes,skin lesions   Psychiatry: Normal Affect   Neuro: No focal deficits. Alert and Oriented x 3      ENCOUNTER ORDERS:  Orders Placed This Encounter   . CT CHEST ABDOMEN PELVIS W IV CONTRAST   . US THORACENTESIS   . THYROID STIMULATING HORMONE WITH FREE T4 REFLEX       LABORATORY/RADIOLOGICAL DATA: All pertinent labs/radiology were reviewed.   CBC  Diff   Lab Results   Component Value Date/Time    WBC 5.9 08/25/2020 08:32 AM    HGB 10.3 (L) 08/25/2020 08:32 AM    HCT 35.6 08/25/2020 08:32 AM    PLTCNT 251 08/25/2020 08:32 AM    RBC 4.72 08/25/2020 08:32 AM    MCV 75.4 (L) 08/25/2020 08:32 AM    MCHC 28.9 (L) 08/25/2020 08:32 AM    MCH 21.8 (L) 08/25/2020 08:32 AM    RDW 19.1 (H) 08/01/2020 08:33 AM    MPV 9.3 08/25/2020 08:32 AM    Lab Results   Component Value Date/Time    PMNS 61 08/25/2020 08:32 AM    LYMPHOCYTES 20 08/01/2020 08:33 AM    EOSINOPHIL 3 08/01/2020 08:33 AM    MONOCYTES 15 08/25/2020 08:32 AM    BASOPHILS 1 08/25/2020 08:32 AM    BASOPHILS <0.10 08/25/2020 08:32 AM    PMNABS 3.71 08/25/2020 08:32 AM    LYMPHSABS 1.13 08/25/2020  08:32 AM    EOSABS 0.15 08/25/2020 08:32 AM    MONOSABS 0.86 08/25/2020 08:32 AM    BASOSABS 0.10 11/10/2014 02:28 PM    BASABS 0.01 09/17/2018 08:24 AM        COMPREHENSIVE METABOLIC PANEL  Lab Results   Component Value Date    SODIUM 138 08/25/2020    POTASSIUM 3.9 08/25/2020    CHLORIDE 108 08/25/2020    CO2 25 08/25/2020    ANIONGAP 5 08/25/2020    BUN  15 08/25/2020    CREATININE 0.80 08/25/2020    GLUCOSE Negative 09/24/2018    CALCIUM 7.9 (L) 08/25/2020    PHOSPHORUS 4.2 12/14/2019    ALBUMIN 3.5 08/25/2020    TOTALPROTEIN 5.9 (L) 08/25/2020    ALKPHOS 79 08/25/2020    AST 22 08/25/2020    ALT 20 08/25/2020    BILIRUBINCON 0.2 03/22/2018     THYROID STIMULATING HORMONE  Lab Results   Component Value Date    TSH 2.310 01/22/2020         ASSESSMENT AND PLAN:  70 y.o. female with history of stage III colon cancer status post 5 cycle of FOLFOX now presenting with stage III lung cancer.      ICD-10-CM    1. Malignant neoplasm of lung, unspecified laterality, unspecified part of lung (CMS HCC)  C34.90 CT CHEST ABDOMEN PELVIS W IV CONTRAST     US THORACENTESIS   2. Fatigue, unspecified type  R53.83 THYROID STIMULATING HORMONE WITH FREE T4 REFLEX       1. 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 in September 2019, status post chemoradiation currently on maintenance durvalumab  She had a recent CT scan from November 2021 which showed increased size of loculated light pleural effusion, increase in the size of the left upper lobe pulmonary nodule measuring up to 1.1 cm, along with anterior wall abdominal hernia  Patient had a follow-up PET scan ordered by prior oncologist, or patient could not tolerate it due to claustrophobia  She presents to discuss care today  Discussed CT findings with patient, left lung nodule concerning for malignancy  Will order CT chest abdomen pelvis to rule out distant metastatic disease  Discussed CT chest  with Dr. Carlos Levering, suggested radial EBUS at [facility] to biopsy the left upper lobe lung nodule  Will discuss with patient if she is agreeable will refer her for biopsy  Hold durvalumab today    CHEMOTHERAPY:  CYCLE 19/26 DAY   DRUG DOSE TOTAL DOSE % ROUTE SCHEDULE                   Durvalumab 95m/kg  [id number]  Q 2 wks              Loculated right pleural effusion  Will try ultrasound-guided thoracentesis      Anemia  Hemoglobin noted to be 10.3  Iron studies show iron deficiency with iron saturation 6%  Patient initiated on oral iron supplements with vitamin-C  Discussed  IV iron infusion, patient will try the pills for some time if does not work then resort to infusion  Patient would like to hold off on EGD/colonoscopy for now     Return in about 3 weeks (around 09/15/2020) for Exam, CBC, CMP, ct chest, usg, ct abd.      SwGala LewandowskyMD  Hematology/Oncology

## 2020-08-29 ENCOUNTER — Other Ambulatory Visit (HOSPITAL_COMMUNITY): Payer: Self-pay | Admitting: Hematology & Oncology

## 2020-08-29 MED ORDER — OXYCODONE 5 MG TABLET
5.0000 mg | ORAL_TABLET | Freq: Four times a day (QID) | ORAL | 0 refills | Status: DC | PRN
Start: 2020-08-29 — End: 2020-09-28

## 2020-08-30 ENCOUNTER — Telehealth (HOSPITAL_COMMUNITY): Payer: Self-pay | Admitting: Internal Medicine

## 2020-08-30 NOTE — Telephone Encounter (Signed)
Called patient with appointment date and time for CT CAP scheduled 09/01/20 @ 3pm and US thoracentesis scheduled 09/15/20 @ 10:45am both appts confirmed, all questions answered.   Kelsey [name], [place]

## 2020-09-01 ENCOUNTER — Ambulatory Visit (HOSPITAL_COMMUNITY): Payer: Medicare Other

## 2020-09-06 ENCOUNTER — Encounter (HOSPITAL_COMMUNITY): Payer: Self-pay | Admitting: Internal Medicine

## 2020-09-06 NOTE — Nursing Note (Signed)
Called patient with appointment date and time for US thoracentesis on 09/15/20. Patient is aware to arrive to second floor, outpatient surgery at 10:45. Patient is also aware to stop all blood thinners, 5 days prior to this procedure. All questions were answered.  Appointment letter and proper instructions mailed to patient. Appt confirmed with patient.Otho Perl, LPN  7/90/2409, 73:53

## 2020-09-15 ENCOUNTER — Other Ambulatory Visit (HOSPITAL_COMMUNITY): Payer: Self-pay

## 2020-09-19 ENCOUNTER — Encounter (HOSPITAL_COMMUNITY): Payer: Self-pay

## 2020-09-19 NOTE — Nursing Note (Signed)
COVID-19 Admission Screen    Low Risk:   Able to Provide asymptomatic History  No recent Travel/ Following Stay at Home  No Sick Contacts  No New Household Conatcts  None    Moderate/High Risk: {None    Test Result:Not Indicated

## 2020-09-22 ENCOUNTER — Encounter (HOSPITAL_COMMUNITY): Payer: Self-pay | Admitting: Internal Medicine

## 2020-09-26 ENCOUNTER — Ambulatory Visit (HOSPITAL_COMMUNITY): Admission: RE | Admit: 2020-09-26 | Payer: Medicare Other | Source: Ambulatory Visit | Admitting: Internal Medicine

## 2020-09-26 ENCOUNTER — Ambulatory Visit
Admission: RE | Admit: 2020-09-26 | Discharge: 2020-09-26 | Disposition: A | Discharge status: Home / Self Care | Payer: Medicare Other | Source: Ambulatory Visit

## 2020-09-26 HISTORY — DX: Dependence on supplemental oxygen: Z99.81

## 2020-09-28 ENCOUNTER — Other Ambulatory Visit (HOSPITAL_COMMUNITY): Payer: Medicare Other

## 2020-09-28 ENCOUNTER — Other Ambulatory Visit (HOSPITAL_COMMUNITY): Payer: Self-pay | Admitting: Internal Medicine

## 2020-09-29 MED ORDER — OXYCODONE 5 MG TABLET
5.0000 mg | ORAL_TABLET | Freq: Four times a day (QID) | ORAL | 0 refills | Status: DC | PRN
Start: 2020-09-29 — End: 2020-10-28

## 2020-10-01 ENCOUNTER — Other Ambulatory Visit: Payer: Self-pay

## 2020-10-04 ENCOUNTER — Encounter (HOSPITAL_COMMUNITY): Payer: Self-pay | Admitting: Internal Medicine

## 2020-10-04 NOTE — Nursing Note (Signed)
Called patient with appointment date and time for CT scan on 10/20/20 at 9:30 am. Patient is aware to arrive 15 minutes prior to get registered and also to pick up the oral contrast in the [place] and drink 3 hours before. Patient also has a US thoracentesis on 11/03/20, patient is aware to arrive at 10:45 am, and also to stop all blood thinners 5 days prior. All questions were answered.  Appointment letter and proper instructions mailed to patient. Appt confirmed with patient.Otho Perl, LPN  6/57/8469, 62:95

## 2020-10-10 ENCOUNTER — Other Ambulatory Visit (HOSPITAL_COMMUNITY): Payer: Medicare Other

## 2020-10-10 ENCOUNTER — Encounter (HOSPITAL_COMMUNITY): Payer: Self-pay | Admitting: Internal Medicine

## 2020-10-17 ENCOUNTER — Encounter (HOSPITAL_COMMUNITY): Payer: Self-pay | Admitting: Internal Medicine

## 2020-10-17 ENCOUNTER — Other Ambulatory Visit (HOSPITAL_COMMUNITY): Payer: Medicare Other

## 2020-10-19 ENCOUNTER — Other Ambulatory Visit (HOSPITAL_COMMUNITY): Payer: Self-pay | Admitting: Internal Medicine

## 2020-10-19 MED ORDER — LORATADINE 10 MG TABLET
10.0000 mg | ORAL_TABLET | Freq: Every day | ORAL | 0 refills | Status: AC
Start: 2020-10-19 — End: ?

## 2020-10-20 ENCOUNTER — Encounter (HOSPITAL_COMMUNITY): Payer: Self-pay | Admitting: Internal Medicine

## 2020-10-20 ENCOUNTER — Ambulatory Visit (HOSPITAL_COMMUNITY)
Admission: RE | Admit: 2020-10-20 | Discharge: 2020-10-20 | Disposition: A | Discharge status: Home / Self Care | Payer: Medicare Other | Source: Ambulatory Visit | Attending: Internal Medicine | Admitting: Internal Medicine

## 2020-10-20 ENCOUNTER — Other Ambulatory Visit: Payer: Self-pay

## 2020-10-20 ENCOUNTER — Ambulatory Visit (HOSPITAL_COMMUNITY)
Admission: RE | Admit: 2020-10-20 | Discharge: 2020-10-20 | Disposition: A | Discharge status: Home / Self Care | Payer: Medicare Other | Source: Ambulatory Visit

## 2020-10-20 ENCOUNTER — Ambulatory Visit (HOSPITAL_BASED_OUTPATIENT_CLINIC_OR_DEPARTMENT_OTHER): Payer: Medicare Other | Admitting: Internal Medicine

## 2020-10-20 ENCOUNTER — Other Ambulatory Visit (HOSPITAL_COMMUNITY): Payer: Self-pay | Admitting: Internal Medicine

## 2020-10-20 ENCOUNTER — Inpatient Hospital Stay (HOSPITAL_COMMUNITY)
Admission: RE | Admit: 2020-10-20 | Discharge: 2020-10-20 | Disposition: A | Discharge status: Still In Hospital | Payer: Medicare Other | Source: Ambulatory Visit

## 2020-10-20 VITALS — BP 151/73 | HR 75 | Temp 96.1°F | Resp 18

## 2020-10-20 VITALS — BP 151/73 | HR 75 | Temp 96.1°F | Resp 18 | Ht 61.0 in | Wt 277.0 lb

## 2020-10-20 DIAGNOSIS — Z85038 Personal history of other malignant neoplasm of large intestine: Secondary | ICD-10-CM | POA: Insufficient documentation

## 2020-10-20 DIAGNOSIS — C3490 Malignant neoplasm of unspecified part of unspecified bronchus or lung: Secondary | ICD-10-CM

## 2020-10-20 DIAGNOSIS — Z8541 Personal history of malignant neoplasm of cervix uteri: Secondary | ICD-10-CM | POA: Insufficient documentation

## 2020-10-20 DIAGNOSIS — D649 Anemia, unspecified: Secondary | ICD-10-CM

## 2020-10-20 DIAGNOSIS — Z5112 Encounter for antineoplastic immunotherapy: Secondary | ICD-10-CM | POA: Insufficient documentation

## 2020-10-20 DIAGNOSIS — R5383 Other fatigue: Secondary | ICD-10-CM | POA: Insufficient documentation

## 2020-10-20 DIAGNOSIS — Z87891 Personal history of nicotine dependence: Secondary | ICD-10-CM | POA: Insufficient documentation

## 2020-10-20 LAB — CBC WITH DIFF
BASOPHIL #: <0.1 10*3/uL (ref <=0.20)
BASOPHIL %: 1 %
EOSINOPHIL #: 0.18 10*3/uL (ref <=0.50)
EOSINOPHIL %: 2 %
HCT: 37.9 % (ref 34.8–46.0)
HGB: 11.4 g/dL — ABNORMAL LOW (ref 11.5–16.0)
IMMATURE GRANULOCYTE #: <0.1 10*3/uL (ref <0.10)
IMMATURE GRANULOCYTE %: 1 % (ref 0–1)
LYMPHOCYTE #: 1.47 10*3/uL (ref 1.00–4.80)
LYMPHOCYTE %: 17 %
MCH: 22.3 pg — ABNORMAL LOW (ref 26.0–32.0)
MCHC: 30.1 g/dL — ABNORMAL LOW (ref 31.0–35.5)
MCV: 74 fL — ABNORMAL LOW (ref 78.0–100.0)
MONOCYTE #: 0.85 10*3/uL (ref 0.20–1.10)
MONOCYTE %: 10 %
MPV: 9.1 fL (ref 8.7–12.5)
NEUTROPHIL #: 6.22 10*3/uL (ref 1.50–7.70)
NEUTROPHIL %: 69 %
PLATELETS: 308 10*3/uL (ref 150–400)
RBC: 5.12 10*6/uL (ref 3.85–5.22)
RDW-CV: 19.3 % — ABNORMAL HIGH (ref 11.5–15.5)
WBC: 8.8 10*3/uL (ref 3.7–11.0)

## 2020-10-20 LAB — COMPREHENSIVE METABOLIC PANEL, NON-FASTING
ALBUMIN: 3.6 g/dL (ref 3.2–4.6)
ALKALINE PHOSPHATASE: 71 U/L (ref 20–130)
ALT (SGPT): 13 U/L (ref <=52)
ANION GAP: 8 mmol/L
AST (SGOT): 19 U/L (ref <=35)
BILIRUBIN TOTAL: 0.5 mg/dL (ref 0.3–1.2)
BUN/CREA RATIO: 16
BUN: 14 mg/dL (ref 10–25)
CALCIUM: 9 mg/dL (ref 8.8–10.3)
CHLORIDE: 103 mmol/L (ref 98–111)
CO2 TOTAL: 28 mmol/L (ref 21–35)
CREATININE: 0.87 mg/dL (ref <=1.30)
ESTIMATED GFR: >60 mL/min/{1.73_m2}
GLUCOSE: 118 mg/dL — ABNORMAL HIGH (ref 70–110)
POTASSIUM: 4.2 mmol/L (ref 3.5–5.0)
PROTEIN TOTAL: 6.2 g/dL (ref 6.0–8.3)
SODIUM: 139 mmol/L (ref 135–145)

## 2020-10-20 LAB — THYROXINE, FREE (FREE T4): THYROXINE (T4), FREE: 0.98 ng/dL (ref 0.61–1.12)

## 2020-10-20 LAB — PLATELETS AND ANC CANCER CENTER
NEUTROPHILS #: 6.22 10*3/uL (ref 1.50–7.70)
PLATELET COUNT: 308 10*3/uL (ref 150–400)

## 2020-10-20 LAB — THYROID STIMULATING HORMONE WITH FREE T4 REFLEX: TSH: 9.75 u[IU]/mL — ABNORMAL HIGH (ref 0.450–5.330)

## 2020-10-20 MED ORDER — SODIUM CHLORIDE 0.9 % INTRAVENOUS SOLUTION
10.0000 mg/kg | Freq: Once | INTRAVENOUS | Status: AC
Start: 2020-10-20 — End: 2020-10-20
  Administered 2020-10-20: 14:00:00 0 mg via INTRAVENOUS
  Administered 2020-10-20: 1260 mg via INTRAVENOUS
  Filled 2020-10-20: qty 25.2

## 2020-10-20 MED ORDER — PREDNISONE 50 MG TABLET
ORAL_TABLET | ORAL | 0 refills | Status: DC
Start: 2020-10-20 — End: 2020-10-27

## 2020-10-20 MED ORDER — SODIUM CHLORIDE 0.9 % INTRAVENOUS SOLUTION
10.0000 mg/kg | Freq: Once | INTRAVENOUS | Status: DC
Start: 2020-10-20 — End: 2020-10-20

## 2020-10-20 MED ORDER — FERROUS SULFATE 325 MG (65 MG IRON) TABLET
325.0000 mg | ORAL_TABLET | Freq: Every day | ORAL | 2 refills | Status: AC
Start: 2020-10-20 — End: 2020-11-19

## 2020-10-20 MED ORDER — DIPHENHYDRAMINE 50 MG CAPSULE
ORAL_CAPSULE | ORAL | 0 refills | Status: DC
Start: 2020-10-20 — End: 2021-01-09

## 2020-10-20 NOTE — Nurses Notes (Addendum)
1255-Dr. Vishwanathan notified that patient went from 114 kg to 126 kg. Pharmacy wanting to know if doasge needs to be change. Wende Crease, RN  1315-Dr. Jolee Ewing changed the dose and awaiting medication from pharmacy. Wende Crease, RN  1325: Port flushed with 98ml NS; +br noted. Imfinzi started to infuse at one hour rate. Wende Crease, RN  1325-Right chest port flushed with 10 ml normal saline. +BR. Imfinzi started to infuse over a 1 hour rate. Wende Crease, RN.  1428-Infusion complete. Port flushed with 10 ml normal saline and 5 ml heparin flush per protocol. +BR. Tolerated well. Denies complaints. Port de-accessed intact and large Band-Aid applied to the site. Aware of next appointment. Patient dc'd via wheelchair/staff, without incident. Wende Crease, RN

## 2020-10-20 NOTE — Progress Notes (Signed)
[place]  [facility] [id number]        Encounter Date: 10/20/2020  11:45 AM EST    Name:  Kelsey [name]  Age: 70 y.o.  DOB: January 21, 1951  Sex: female  PCP: Page    Chief Complaint:    Chief Complaint   Patient presents with   . Follow Up     HISTORY OF PRESENT ILLNESS:   70 y.o. female with history of stage III colon cancer of present evaluation and management of lung cancer.    1. September 2015. Patient underwent sigmoidoscopy and was found to have polyp which was adenocarcinoma.    2. June 10, 2014. She was admitted for segmental resection of sigmoid colon. This revealed pT3, N1a, MX, low-grade  adenocarcinoma of the colon. The mass measuring 4 x 3 x 0.5 centimeters. It  was low-grade and there was no evidence of microsatellite instability by  histology. Margins were uninvolved and 4 lymph nodes only were removed which  only 1 was involved. This corresponding stage IIIB, T3, N1a, M0, low-grade  sigmoid cancer.     3. July 07, 2014. Recommended adjuvant chemotherapy with FOLFOX.    4. July 27, 2014. Patient was started on adjuvant chemotherapy with FOLFOX.  Patient received only 5 treatments and chemotherapy was stopped due to poor tolerance.  After that patient lost follow-up.      5. October 2019. Patient was admitted to hospital with concern for  bowel obstruction.  CT chest showed lung nodules.  Patient was managed conservatively and discharged home.    6. May 06, 2018.  Patient underwent CT-guided biopsy of lung lesion.  Pathology showed well-differentiated adenocarcinoma.  Section shows well-differentiated adenocarcinoma which positive for CK 7 and TTF 1, negative for P 63 and CK 20.     7. June 23, 2018. PET scan showed Hypermetabolic right lower lobe pulmonary mass compatible with malignancy.  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 Hepatomegaly and  steatosis.     8. July 23, 2018. Patient was started on chemoradiation.    9. October 30, 2018. PET scan showed Hypermetabolic right lower lobe mass with abnormal mediastinal and hilar nodes. There has been only mild improvement since earlier exam.     10. October 31, 2018. Patient was started on maintenance immunotherapy with durvalumab.    11. February 17, 2019. CT chest revealed Moderate right effusion new from 12/19/2018. No change in the right lower lung mass or a presumed postradiation changes in the right upper lung when compared to 12/19/2018. Mass in the right cardiophrenic angle increased from 10/30/2019 6). Consider an enlarged lymph node here. This is unchanged from 12/19/2018 but increased since size from 10/30/2018. Another possibility is an enlarging pericardial cyst (this region was not hypermetabolic 02/27/6268). Mild enlargement of the left adrenal gland stable from 10/30/2018. Approximate 9 cm upper abdominal ventral hernia containing nonobstructed bowel loops unchanged    12. March 06, 2019. PET CT revealed 1. The RIGHT lower lobe mass has decreased in size and uptake, which is consistent with response to therapy. However, abnormal uptake remains. This is consistent with residual tumor. Suspect post radiation change in the RIGHT pneumothorax. Consolidation in the medial  RIGHT lung has tumor level uptake. Differential: Post radiation change, infection, and tumor infiltration. Additional findings are concerning for tumor infiltration. Follow-up recommended. Increased RIGHT hilar density and uptake. Concerning for tumor spread. Post radiation change less likely. 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 A low-attenuation mass in the RIGHT cardiophrenic angle may be related. Lymph node metastasis less likely.    13. October 29, 2019. CT chest revealed 1. Persistent dense consolidation in the RIGHT middle lobe. Compatible with post radiation change. Residual tumor cannot be excluded. 2. Scattered groundglass opacities  in both lungs. Favor post radiation change or infiltrate or tumor. Follow-up recommended. 3. A 5 mm nodule in the LEFT lingula is concerning for metastatic disease. Follow-up is recommended. PET/CT could be useful. Alternatively, tissue biopsy.    14. July 13, 2020. CT Chest reveals 1. Interval increase in size of a small partially loculated right-sided pleural effusion. Pneumothorax component has resolved. 2. Interval increase in size of a left upper lobe pulmonary nodule. Findings concerning for disease progression. 3. Interval resolution of the left lung infiltrate compared to the prior study. 4. Posttreatment changes redemonstrated along the right hilar region.     08/25/20 Established care with Dr Jolee Ewing     SUBJECTIVE:  Patient presents for follow-up and treatment.  She missed her prior appointment.  She is on baseline 2 L oxygen.  Patient denies any new headache blurred vision dizziness.  Patient denies any new bone pain.  She is currently in a wheelchair.  = her CT scans were canceled today due to history of iodine allergy.   She also endorses fatigue    REVIEW OF SYSTEMS:  GENERAL:   Reports of fevers and chills.  reports fatigue   HEENT: no sore throat, congestion, blurry vision  Lungs:  Denies any worsening shortness of breath.  Complains of dry cough. Reports dyspnea with exertion  GI: no nausea, vomiting, constipation, no diarrhea  GU: No dysuria, urgency, increased frequency   Cardiac:  Denies any chest pain, palpitations, syncope,   Neuro: reports weakness, loss of function, numbness, tingling  Skin: no rash  Musculoskeletal: reports myalgias and weakness  Psychosocial: No depression, anxiety  Hematologic: No easy bruising, abnormal bleeding  All other review ROS negative except those mentioned above.     PATIENT HISTORY:  Past Medical History:   Diagnosis Date   . A-fib (CMS HCC)    . Arthropathy, unspecified, site unspecified    . Asthma    . Cancer (CMS HCC)     cervical   . Cataract    .  Colon cancer (CMS [facility]) 07/06/2014   . COPD (chronic obstructive pulmonary disease) (CMS HCC)    . Fistula    . HTN (hypertension)    . Hyperlipidemia    . Lung mass     with pleural effusion   . Obesity    . Oxygen dependent     2 liters nc    . Sleep apnea    . Ventral hernia        Past Surgical History:   Procedure Laterality Date   . ABSCESS DRAINAGE     . BOWEL RESECTION     . CATARACT EXTRACTION     . HX CERVICAL CONE BIOPSY      . HX CESAREAN SECTION     . HX GASTRIC BYPASS      25 years ago   . [place] (x2)   . HX  HYSTERECTOMY      late 1990s   . HX LAP BANDING      2013   . HX LAPAROTOMY      2013 to remove lap band       Family Medical History:     Problem Relation (Age of Onset)    Diabetes Mother, Maternal Grandmother, Maternal Grandfather, Father, Sister    Heart Attack Mother    Stroke Father          Current Outpatient Medications   Medication Sig   . albuterol sulfate (PROVENTIL OR VENTOLIN OR PROAIR) 90 mcg/actuation Inhalation HFA Aerosol Inhaler INHALE 1 TO 2 PUFFS BY MOUTH EVERY 6 HOURS AS NEEDED   . albuterol sulfate (PROVENTIL) 2.5 mg /3 mL (0.083 %) Inhalation Solution for Nebulization 3 mL (2.5 mg total) by Nebulization route Every 6 hours   . apixaban (ELIQUIS) 5 mg Oral Tablet Take 1 Tab (5 mg total) by mouth Twice daily   . aspirin (ECOTRIN) 81 mg Oral Tablet, Delayed Release (E.C.) Take 81 mg by mouth Once a day   . atorvastatin (LIPITOR) 10 mg Oral Tablet Take 10 mg by mouth Once a day   . BREZTRI AEROSPHERE 160-9-4.8 mcg/actuation Inhalation HFA Aerosol Inhaler INHALE 2 PUFFS BY MOUTH TWICE DAILY   . carvediloL (COREG) 3.125 mg Oral Tablet Take 3.125 mg by mouth Twice daily with food   . cyanocobalamin (VITAMIN B 12) 1,000 mcg Oral Tablet Take 1 Tablet (1,000 mcg total) by mouth Once a day   . diphenhydrAMINE (BENADRYL) 50 mg Oral Capsule Take 1 capsule by mouth 1 hour prior to CT scan.   . docusate sodium (COLACE) 100 mg Oral Capsule Take 100 mg by mouth Three  times a day as needed    . ferrous sulfate (FEOSOL) 325 mg (65 mg iron) Oral Tablet Take 1 Tablet (325 mg total) by mouth Once a day   . ferrous sulfate (FEOSOL) 325 mg (65 mg iron) Oral Tablet Take 1 Tablet (325 mg total) by mouth Once a day for 30 days   . flecainide (TAMBOCOR) 100 mg Oral Tablet Take 1 Tab (100 mg total) by mouth Twice daily   . guaiFENesin (MUCINEX) 600 mg Oral Tablet Extended Release 12hr Take 2 Tablets (1,200 mg total) by mouth Twice daily   . levothyroxine (SYNTHROID) 112 mcg Oral Tablet Take 1 Tablet (112 mcg total) by mouth Every morning   . linaCLOtide (LINZESS) 72 mcg Oral Capsule Take 72 mcg by mouth Every morning   . lisinopriL (PRINIVIL) 20 mg Oral Tablet Take 20 mg by mouth Once a day   . loperamide (IMODIUM) 2 mg Oral Capsule Take 2 mg by mouth Every 4 hours as needed   . loratadine (CLARITIN) 10 mg Oral Tablet Take 1 Tablet (10 mg total) by mouth Once a day   . magnesium oxide (MAG-OX) 400 mg Oral Tablet Take 400 mg by mouth Once a day   . omeprazole (PRILOSEC) 20 mg Oral Capsule, Delayed Release(E.C.) Take 40 mg by mouth Once a day    . oxyCODONE (ROXICODONE) 5 mg Oral Tablet Take 1 Tablet (5 mg total) by mouth Every 6 hours as needed for Pain   . potassium chloride (K-DUR) 20 mEq Oral Tab Sust.Rel. Particle/Crystal Take 20 mEq by mouth Once a day Takes 3 days a week   . predniSONE (DELTASONE) 50 mg Oral Tablet Take 1 tablet by mouth 13 hours prior to CT scan, 7 hours prior to CT scan, and  1 hour prior to CT scan.   . prochlorperazine (COMPAZINE) 10 mg Oral Tablet Take 1 Tab (10 mg total) by mouth Four times a day as needed for Nausea/Vomiting   . rOPINIRole (REQUIP) 0.5 mg Oral Tablet Take 0.5 mg by mouth Every night   . tiotropium bromide (SPIRIVA HANDIHALER) 18 mcg Inhalation Capsule, w/Inhalation Device Take 1 Capsule (18 mcg total) by inhalation Once a day for 30 days   . umeclidinium (INCRUSE ELLIPTA) 62.5 mcg/actuation Inhalation Disk with Device Take 1 INHALATION by  inhalation Once a day     Social History     Socioeconomic History   . Marital status: Married   Tobacco Use   . Smoking status: Former Research scientist (life sciences)   . Smokeless tobacco: Never Used   . Tobacco comment: quit 10 yrs ago   Vaping Use   . Vaping Use: Never used   Substance and Sexual Activity   . Alcohol use: Not Currently     Comment: rarely   . Drug use: No   . Sexual activity: Not Currently   Other Topics Concern   . Ability to Walk 1 Flight of Steps without SOB/CP No   . Ability To Do Own ADL's Yes       PHYSICAL EXAMINATION:  General Vitals: BP (!) 151/73   Pulse 75   Temp 35.6 C (96.1 F)   Resp 18   Ht 1.549 m (_0 )   Wt 126 kg (277 lb)   SpO2 97%   BMI 52.34 kg/m       PHYSICAL EXAM:   Consitutional: Appears fatigue  Eyes: EOMI. No discharge. No Jaundice.   ENT: mucous membranes dry. No posterior pharynx lesions.. Neck supple, No palpable masses   Heme/ Lymph: No Cervical, Inguinal, axillary lymh nodes. No Bruising.   Cardiovascular: S1, S2, No murmurs, rubs, or gallops.  Respiratory: RLL diminished sounds and otherwise coarse lung sounds noted.   Abdomen: Normal Bowel Sounds, nontender nondistended. No hepatosplenomegaly.   Musculoskeletal: No Edema to the extremities.   Skin: Normal turgor. No Rashes,skin lesions   Psychiatry: Normal Affect   Neuro: No focal deficits. Alert and Oriented x 3      ENCOUNTER ORDERS:  Orders Placed This Encounter   . CT CHEST ABDOMEN PELVIS W IV CONTRAST   . predniSONE (DELTASONE) 50 mg Oral Tablet   . diphenhydrAMINE (BENADRYL) 50 mg Oral Capsule   . ferrous sulfate (FEOSOL) 325 mg (65 mg iron) Oral Tablet       LABORATORY/RADIOLOGICAL DATA: All pertinent labs/radiology were reviewed.   CBC  Diff   Lab Results   Component Value Date/Time    WBC 8.8 10/20/2020 10:39 AM    HGB 11.4 (L) 10/20/2020 10:39 AM    HCT 37.9 10/20/2020 10:39 AM    PLTCNT 308 10/20/2020 10:39 AM    PLTCNT 308 10/20/2020 10:39 AM    RBC 5.12 10/20/2020 10:39 AM    MCV 74.0 (L) 10/20/2020 10:39 AM     MCHC 30.1 (L) 10/20/2020 10:39 AM    MCH 22.3 (L) 10/20/2020 10:39 AM    RDW 19.1 (H) 08/01/2020 08:33 AM    MPV 9.1 10/20/2020 10:39 AM    Lab Results   Component Value Date/Time    PMNS 69 10/20/2020 10:39 AM    LYMPHOCYTES 20 08/01/2020 08:33 AM    EOSINOPHIL 3 08/01/2020 08:33 AM    MONOCYTES 10 10/20/2020 10:39 AM    BASOPHILS 1 10/20/2020 10:39 AM    BASOPHILS <  0.10 10/20/2020 10:39 AM    PMNABS 6.22 10/20/2020 10:39 AM    PMNABS 6.22 10/20/2020 10:39 AM    LYMPHSABS 1.47 10/20/2020 10:39 AM    EOSABS 0.18 10/20/2020 10:39 AM    MONOSABS 0.85 10/20/2020 10:39 AM    BASOSABS 0.10 11/10/2014 02:28 PM    BASABS 0.01 09/17/2018 08:24 AM        COMPREHENSIVE METABOLIC PANEL  Lab Results   Component Value Date    SODIUM 139 10/20/2020    POTASSIUM 4.2 10/20/2020    CHLORIDE 103 10/20/2020    CO2 28 10/20/2020    ANIONGAP 8 10/20/2020    BUN 14 10/20/2020    CREATININE 0.87 10/20/2020    GLUCOSE Negative 09/24/2018    CALCIUM 9.0 10/20/2020    PHOSPHORUS 4.2 12/14/2019    ALBUMIN 3.6 10/20/2020    TOTALPROTEIN 6.2 10/20/2020    ALKPHOS 71 10/20/2020    AST 19 10/20/2020    ALT 13 10/20/2020    BILIRUBINCON 0.2 03/22/2018     THYROID STIMULATING HORMONE  Lab Results   Component Value Date    TSH 9.750 (H) 10/20/2020         ASSESSMENT AND PLAN:  70 y.o. female with history of stage III colon cancer status post 5 cycle of FOLFOX now presenting with stage III lung cancer.      ICD-10-CM    1. Malignant neoplasm of lung, unspecified laterality, unspecified part of lung (CMS HCC)  C34.90 CT CHEST ABDOMEN PELVIS W IV CONTRAST       1. 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 in September 2019, status post chemoradiation currently on maintenance durvalumab  She had a recent CT scan from November 2021 which showed increased size of loculated light pleural effusion, increase in the size of the left upper lobe pulmonary nodule measuring up to  1.1 cm, along with anterior wall abdominal hernia  Patient had a follow-up PET scan ordered by prior oncologist,  patient could not tolerate it due to claustrophobia  Discussed CT findings with patient, left lung nodule concerning for malignancy  Will order CT chest abdomen pelvis to rule out distant metastatic disease, she will be given premedications to prevent allergic reaction  Discussed CT chest with Dr. Carlos Levering, suggested radial EBUS at [facility] to biopsy the left upper lobe lung nodule  The lung nodule remains the only site of disease can consider local radiation  Resume durvalumab today      CHEMOTHERAPY:  CYCLE 20/26 DAY   DRUG DOSE TOTAL DOSE % ROUTE SCHEDULE                   Durvalumab 44m/kg  [id number] IV  Q 2 wks              Loculated right pleural effusion  ORDERED  ultrasound-guided thoracentesis      Anemia  Hemoglobin noted to be 11.4  Iron studies show iron deficiency with iron saturation 6%  Discussed  IV iron infusion, patient will try the pills for some time if does not work then resort to infusion  Patient would like to hold off on EGD/colonoscopy for now     Return in about 2 weeks (around 11/03/2020) for Exam, CBC, CMP, ct chest, ct abd.      SwGala LewandowskyMD  Hematology/Oncology

## 2020-10-21 ENCOUNTER — Telehealth (HOSPITAL_COMMUNITY): Payer: Self-pay | Admitting: Internal Medicine

## 2020-10-21 NOTE — Telephone Encounter (Signed)
Called and spoke with the patient and her husband to notify them of the appointment date/time and instructions for the upcoming diagnostic testing as follows:    [facility] CT Scan Chest/Abdomen/Pelvis on 10/27/2020 @ 4:15 pm, patient needs to arrive by 4:00 pm to register.  Advised that she needs to pick up a bottle of the oral contrast at [facility] [employer]  Mix the oral contrast according to the instructions included with the bottle (usually 1/2 bottle into 24 oz drink of her choice) and drink 3 hrs prior (1:00 pm) to the test.  Reminded to pick up the pre med needed to be taken prior to the test due to her allergy.  The voiced their understanding of the instructions and know to call the office with any problems or questions.  They were unhappy with the time of day of her test and were going to try and get that changed.  Appt reminder letter was mailed.    Bary Castilla, MA  10/21/2020, 14:40

## 2020-10-27 ENCOUNTER — Encounter (HOSPITAL_COMMUNITY): Payer: Self-pay

## 2020-10-27 ENCOUNTER — Other Ambulatory Visit (HOSPITAL_COMMUNITY): Payer: Self-pay | Admitting: Internal Medicine

## 2020-10-27 ENCOUNTER — Telehealth (HOSPITAL_COMMUNITY): Payer: Self-pay | Admitting: Internal Medicine

## 2020-10-27 ENCOUNTER — Other Ambulatory Visit: Payer: Self-pay

## 2020-10-27 ENCOUNTER — Ambulatory Visit
Admission: RE | Admit: 2020-10-27 | Discharge: 2020-10-27 | Disposition: A | Discharge status: Home / Self Care | Payer: Medicare Other | Source: Ambulatory Visit | Attending: Internal Medicine | Admitting: Internal Medicine

## 2020-10-27 DIAGNOSIS — C3490 Malignant neoplasm of unspecified part of unspecified bronchus or lung: Secondary | ICD-10-CM

## 2020-10-27 MED ORDER — DIATRIZOATE MEGLUMINE-DIATRIZOATE SODIUM 66 %-10 % ORAL SOLUTION
15.0000 mL | Freq: Once | ORAL | Status: AC | PRN
Start: 2020-10-27 — End: 2020-10-27
  Administered 2020-10-27: 15 mL via ORAL

## 2020-10-27 MED ORDER — LORAZEPAM 0.5 MG TABLET
0.5000 mg | ORAL_TABLET | Freq: Every day | ORAL | 0 refills | Status: DC | PRN
Start: 2020-10-27 — End: 2020-10-27

## 2020-10-27 NOTE — Nursing Note (Signed)
COVID-19 Admission Screen    Low Risk:   Able to Provide asymptomatic History  No recent Travel/ Following Stay at Home  No Sick Contacts  No New Household Conatcts  None    Moderate/High Risk: {None    Test Result:Not Indicated

## 2020-10-27 NOTE — Telephone Encounter (Addendum)
Pt is to get her CT scan today at 4:15pm  and her husband is on the phone requesting an order for anesthesia for her scan since she has anxiety and panic attacks. He can be reached at their home phone.     1100: Called and spoke to pt letting her know we can not give anesthesia for a CT scan. I offered her Ativan per Dr. Clayton Bibles and she was agreeable to that. She asked that it be called in to the [facility]. I will let Dr. Clayton Bibles know.

## 2020-10-28 ENCOUNTER — Other Ambulatory Visit (HOSPITAL_COMMUNITY): Payer: Self-pay | Admitting: Hematology & Oncology

## 2020-10-31 MED ORDER — OXYCODONE 5 MG TABLET
5.0000 mg | ORAL_TABLET | Freq: Four times a day (QID) | ORAL | 0 refills | Status: DC | PRN
Start: 2020-10-31 — End: 2020-12-19

## 2020-11-03 ENCOUNTER — Ambulatory Visit (HOSPITAL_COMMUNITY): Admission: RE | Admit: 2020-11-03 | Payer: Medicare Other | Source: Ambulatory Visit | Admitting: Internal Medicine

## 2020-11-03 ENCOUNTER — Ambulatory Visit
Admission: RE | Admit: 2020-11-03 | Discharge: 2020-11-03 | Disposition: A | Discharge status: Home / Self Care | Payer: Medicare Other | Source: Ambulatory Visit

## 2020-11-03 ENCOUNTER — Ambulatory Visit (HOSPITAL_COMMUNITY): Payer: Medicare Other | Admitting: Internal Medicine

## 2020-11-07 ENCOUNTER — Ambulatory Visit (HOSPITAL_BASED_OUTPATIENT_CLINIC_OR_DEPARTMENT_OTHER): Payer: Medicare Other | Admitting: Internal Medicine

## 2020-11-07 ENCOUNTER — Encounter (HOSPITAL_COMMUNITY): Payer: Self-pay

## 2020-11-07 ENCOUNTER — Other Ambulatory Visit: Payer: Self-pay

## 2020-11-07 ENCOUNTER — Inpatient Hospital Stay
Admission: RE | Admit: 2020-11-07 | Discharge: 2020-11-07 | Disposition: A | Discharge status: Still In Hospital | Payer: Medicare Other | Source: Ambulatory Visit | Attending: Internal Medicine | Admitting: Internal Medicine

## 2020-11-07 ENCOUNTER — Ambulatory Visit (HOSPITAL_COMMUNITY)
Admission: RE | Admit: 2020-11-07 | Discharge: 2020-11-07 | Disposition: A | Discharge status: Home / Self Care | Payer: Medicare Other | Source: Ambulatory Visit

## 2020-11-07 ENCOUNTER — Encounter (HOSPITAL_COMMUNITY): Payer: Self-pay | Admitting: Internal Medicine

## 2020-11-07 VITALS — BP 148/77 | HR 73 | Temp 96.9°F | Resp 18 | Ht 61.0 in | Wt 283.0 lb

## 2020-11-07 VITALS — BP 148/77 | HR 73 | Temp 97.0°F | Resp 18 | Ht 61.0 in | Wt 282.2 lb

## 2020-11-07 DIAGNOSIS — C3490 Malignant neoplasm of unspecified part of unspecified bronchus or lung: Secondary | ICD-10-CM

## 2020-11-07 DIAGNOSIS — C3491 Malignant neoplasm of unspecified part of right bronchus or lung: Secondary | ICD-10-CM | POA: Insufficient documentation

## 2020-11-07 DIAGNOSIS — R911 Solitary pulmonary nodule: Secondary | ICD-10-CM | POA: Insufficient documentation

## 2020-11-07 DIAGNOSIS — Z85038 Personal history of other malignant neoplasm of large intestine: Secondary | ICD-10-CM | POA: Insufficient documentation

## 2020-11-07 DIAGNOSIS — Z8541 Personal history of malignant neoplasm of cervix uteri: Secondary | ICD-10-CM | POA: Insufficient documentation

## 2020-11-07 DIAGNOSIS — J449 Chronic obstructive pulmonary disease, unspecified: Secondary | ICD-10-CM | POA: Insufficient documentation

## 2020-11-07 DIAGNOSIS — Z87891 Personal history of nicotine dependence: Secondary | ICD-10-CM | POA: Insufficient documentation

## 2020-11-07 LAB — COMPREHENSIVE METABOLIC PANEL, NON-FASTING
ALBUMIN: 3.7 g/dL (ref 3.2–4.6)
ALKALINE PHOSPHATASE: 72 U/L (ref 20–130)
ALT (SGPT): 19 U/L (ref <=52)
ANION GAP: 7 mmol/L
AST (SGOT): 27 U/L (ref <=35)
BILIRUBIN TOTAL: 0.6 mg/dL (ref 0.3–1.2)
BUN/CREA RATIO: 17
BUN: 15 mg/dL (ref 10–25)
CALCIUM: 8.9 mg/dL (ref 8.8–10.3)
CHLORIDE: 103 mmol/L (ref 98–111)
CO2 TOTAL: 27 mmol/L (ref 21–35)
CREATININE: 0.86 mg/dL (ref <=1.30)
ESTIMATED GFR: >60 mL/min/{1.73_m2}
GLUCOSE: 101 mg/dL (ref 70–110)
POTASSIUM: 4.3 mmol/L (ref 3.5–5.0)
PROTEIN TOTAL: 6.1 g/dL (ref 6.0–8.3)
SODIUM: 137 mmol/L (ref 135–145)

## 2020-11-07 LAB — CBC WITH DIFF
BASOPHIL #: <0.1 10*3/uL (ref <=0.20)
BASOPHIL %: 1 %
EOSINOPHIL #: 0.19 10*3/uL (ref <=0.50)
EOSINOPHIL %: 2 %
HCT: 38 % (ref 34.8–46.0)
HGB: 11.6 g/dL (ref 11.5–16.0)
IMMATURE GRANULOCYTE #: <0.1 10*3/uL (ref <0.10)
IMMATURE GRANULOCYTE %: 0 % (ref 0–1)
LYMPHOCYTE #: 1.15 10*3/uL (ref 1.00–4.80)
LYMPHOCYTE %: 14 %
MCH: 23.4 pg — ABNORMAL LOW (ref 26.0–32.0)
MCHC: 30.5 g/dL — ABNORMAL LOW (ref 31.0–35.5)
MCV: 76.8 fL — ABNORMAL LOW (ref 78.0–100.0)
MONOCYTE #: 0.87 10*3/uL (ref 0.20–1.10)
MONOCYTE %: 11 %
MPV: 8.9 fL (ref 8.7–12.5)
NEUTROPHIL #: 5.9 10*3/uL (ref 1.50–7.70)
NEUTROPHIL %: 72 %
PLATELETS: 258 10*3/uL (ref 150–400)
RBC: 4.95 10*6/uL (ref 3.85–5.22)
RDW-CV: 21.3 % — ABNORMAL HIGH (ref 11.5–15.5)
WBC: 8.2 10*3/uL (ref 3.7–11.0)

## 2020-11-07 LAB — PLATELETS AND ANC CANCER CENTER
NEUTROPHILS #: 5.9 10*3/uL (ref 1.50–7.70)
PLATELET COUNT: 258 10*3/uL (ref 150–400)

## 2020-11-07 LAB — MAGNESIUM: MAGNESIUM: 2.1 mg/dL (ref 1.8–2.3)

## 2020-11-07 MED ORDER — SODIUM CHLORIDE 0.9 % INTRAVENOUS SOLUTION
10.0000 mg/kg | Freq: Once | INTRAVENOUS | Status: AC
Start: 2020-11-07 — End: 2020-11-07
  Administered 2020-11-07: 12:00:00 0 mg via INTRAVENOUS
  Administered 2020-11-07: 11:00:00 1260 mg via INTRAVENOUS
  Filled 2020-11-07: qty 25.2

## 2020-11-07 NOTE — Nurses Notes (Addendum)
[place] Dr. Jolee Ewing about duplicate order for imfinzi. She stated one imfinzi is to be infusion. Second order deleted. Tobias Alexander, RN  [id number] right chest port flushed with 10 ml NS, +BR. imfinzi started to infuse over 14mins. Tobias Alexander, RN  1215 imfinzi complete. Port flushed with 56ml NS, +BR and heparinized with 76ml of Heparin. Port deaccessed and dressed with Band-Aid. Pt left via wheel chair. Tobias Alexander, RN

## 2020-11-07 NOTE — Progress Notes (Signed)
[place]  [place] [address]        Encounter Date: 11/07/2020   9:00 AM EDT    Name:  Kelsey [name]  Age: 70 y.o.  DOB: 1951-06-27  Sex: female  PCP: [facility]    Chief Complaint:    Chief Complaint   Patient presents with   . Treatment     HISTORY OF PRESENT ILLNESS:   70 y.o. female with history of stage III colon cancer of present evaluation and management of lung cancer.    1. September 2015. Patient underwent sigmoidoscopy and was found to have polyp which was adenocarcinoma.    2. June 10, 2014. She was admitted for segmental resection of sigmoid colon. This revealed pT3, N1a, MX, low-grade  adenocarcinoma of the colon. The mass measuring 4 x 3 x 0.5 centimeters. It  was low-grade and there was no evidence of microsatellite instability by  histology. Margins were uninvolved and 4 lymph nodes only were removed which  only 1 was involved. This corresponding stage IIIB, T3, N1a, M0, low-grade  sigmoid cancer.     3. July 07, 2014. Recommended adjuvant chemotherapy with FOLFOX.    4. July 27, 2014. Patient was started on adjuvant chemotherapy with FOLFOX.  Patient received only 5 treatments and chemotherapy was stopped due to poor tolerance.  After that patient lost follow-up.      5. October 2019. Patient was admitted to hospital with concern for  bowel obstruction.  CT chest showed lung nodules.  Patient was managed conservatively and discharged home.    6. May 06, 2018.  Patient underwent CT-guided biopsy of lung lesion.  Pathology showed well-differentiated adenocarcinoma.  Section shows well-differentiated adenocarcinoma which positive for CK 7 and TTF 1, negative for P 63 and CK 20.     7. June 23, 2018. PET scan showed Hypermetabolic right lower lobe pulmonary mass compatible with malignancy.  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 Hepatomegaly and  steatosis.     8. July 23, 2018. Patient was started on chemoradiation.    9. October 30, 2018. PET scan showed Hypermetabolic right lower lobe mass with abnormal mediastinal and hilar nodes. There has been only mild improvement since earlier exam.     10. October 31, 2018. Patient was started on maintenance immunotherapy with durvalumab.    11. February 17, 2019. CT chest revealed Moderate right effusion new from 12/19/2018. No change in the right lower lung mass or a presumed postradiation changes in the right upper lung when compared to 12/19/2018. Mass in the right cardiophrenic angle increased from 10/30/2019 6). Consider an enlarged lymph node here. This is unchanged from 12/19/2018 but increased since size from 10/30/2018. Another possibility is an enlarging pericardial cyst (this region was not hypermetabolic 3/88/8280). Mild enlargement of the left adrenal gland stable from 10/30/2018. Approximate 9 cm upper abdominal ventral hernia containing nonobstructed bowel loops unchanged    12. March 06, 2019. PET CT revealed 1. The RIGHT lower lobe mass has decreased in size and uptake, which is consistent with response to therapy. However, abnormal uptake remains. This is consistent with residual tumor. Suspect post radiation change in the RIGHT pneumothorax. Consolidation in the medial  RIGHT lung has tumor level uptake. Differential: Post radiation change, infection, and tumor infiltration. Additional findings are concerning for tumor infiltration. Follow-up recommended. Increased RIGHT hilar density and uptake. Concerning for tumor spread. Post radiation change less likely. 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 A low-attenuation mass in the RIGHT cardiophrenic angle may be related. Lymph node metastasis less likely.    13. October 29, 2019. CT chest revealed 1. Persistent dense consolidation in the RIGHT middle lobe. Compatible with post radiation change. Residual tumor cannot be excluded. 2. Scattered groundglass opacities  in both lungs. Favor post radiation change or infiltrate or tumor. Follow-up recommended. 3. A 5 mm nodule in the LEFT lingula is concerning for metastatic disease. Follow-up is recommended. PET/CT could be useful. Alternatively, tissue biopsy.    14. July 13, 2020. CT Chest reveals 1. Interval increase in size of a small partially loculated right-sided pleural effusion. Pneumothorax component has resolved. 2. Interval increase in size of a left upper lobe pulmonary nodule. Findings concerning for disease progression. 3. Interval resolution of the left lung infiltrate compared to the prior study. 4. Posttreatment changes redemonstrated along the right hilar region.     08/25/20 Established care with Dr Jolee Ewing     10/27/20: Ct chest/abd pelvis  IMPRESSION:  Probable postradiation change right hilum, loculated right pleural effusion and left upper lobe lung nodule worrisome for metastatic disease.  The findings are similar to 07/13/2020  IMPRESSION:  No evidence of metastatic disease in the abdomen and pelvis.    SUBJECTIVE:  Patient presents for follow-up and treatment.  She did not tolerate a PET scan.  Repeat CT chest Abdo 1 showed 11 mm left upper lobe lung nodule concerning for metastatic disease, no evidence of disease in the abdomen.  Patient endorses fatigue and is accompanied with her husband.       REVIEW OF SYSTEMS:  GENERAL:   Reports of fevers and chills.  reports fatigue   HEENT: no sore throat, congestion, blurry vision  Lungs:  Denies any worsening shortness of breath.  Complains of dry cough. Reports dyspnea with exertion  GI: no nausea, vomiting, constipation, no diarrhea  GU: No dysuria, urgency, increased frequency   Cardiac:  Denies any chest pain, palpitations, syncope,   Neuro: reports weakness, loss of function, numbness, tingling  Skin: no rash  Musculoskeletal: reports myalgias and weakness  Psychosocial: No depression, anxiety  Hematologic: No easy bruising, abnormal bleeding  All  other review ROS negative except those mentioned above.     PATIENT HISTORY:  Past Medical History:   Diagnosis Date   . A-fib (CMS HCC)    . Arthropathy, unspecified, site unspecified    . Asthma    . Cancer (CMS HCC)     cervical   . Cataract    . Colon cancer (CMS [facility]) 07/06/2014   . COPD (chronic obstructive pulmonary disease) (CMS HCC)    . Fistula    . HTN (hypertension)    . Hyperlipidemia    . Lung mass     with pleural effusion   . Obesity    . Oxygen dependent     2 liters nc    . Sleep apnea    . Ventral hernia        Past Surgical History:   Procedure Laterality Date   . ABSCESS DRAINAGE     . BOWEL RESECTION     . CATARACT EXTRACTION     . HX CERVICAL CONE BIOPSY      .  HX CESAREAN SECTION     . HX GASTRIC BYPASS      25 years ago   . [place] (x2)   . HX HYSTERECTOMY      late 1990s   . HX LAP BANDING      2013   . HX LAPAROTOMY      2013 to remove lap band       Family Medical History:     Problem Relation (Age of Onset)    Diabetes Mother, Maternal Grandmother, Maternal Grandfather, Father, Sister    Heart Attack Mother    Stroke Father          Current Outpatient Medications   Medication Sig   . albuterol sulfate (PROVENTIL OR VENTOLIN OR PROAIR) 90 mcg/actuation Inhalation HFA Aerosol Inhaler INHALE 1 TO 2 PUFFS BY MOUTH EVERY 6 HOURS AS NEEDED   . albuterol sulfate (PROVENTIL) 2.5 mg /3 mL (0.083 %) Inhalation Solution for Nebulization 3 mL (2.5 mg total) by Nebulization route Every 6 hours   . apixaban (ELIQUIS) 5 mg Oral Tablet Take 1 Tab (5 mg total) by mouth Twice daily   . aspirin (ECOTRIN) 81 mg Oral Tablet, Delayed Release (E.C.) Take 81 mg by mouth Once a day   . atorvastatin (LIPITOR) 10 mg Oral Tablet Take 10 mg by mouth Once a day   . BREZTRI AEROSPHERE 160-9-4.8 mcg/actuation Inhalation HFA Aerosol Inhaler INHALE 2 PUFFS BY MOUTH TWICE DAILY   . carvediloL (COREG) 3.125 mg Oral Tablet Take 3.125 mg by mouth Twice daily with food   . diphenhydrAMINE (BENADRYL) 50  mg Oral Capsule Take 1 capsule by mouth 1 hour prior to CT scan.   . docusate sodium (COLACE) 100 mg Oral Capsule Take 100 mg by mouth Three times a day as needed    . ferrous sulfate (FEOSOL) 325 mg (65 mg iron) Oral Tablet Take 1 Tablet (325 mg total) by mouth Once a day   . ferrous sulfate (FEOSOL) 325 mg (65 mg iron) Oral Tablet Take 1 Tablet (325 mg total) by mouth Once a day for 30 days   . flecainide (TAMBOCOR) 100 mg Oral Tablet Take 1 Tab (100 mg total) by mouth Twice daily   . levothyroxine (SYNTHROID) 112 mcg Oral Tablet Take 1 Tablet (112 mcg total) by mouth Every morning   . linaCLOtide (LINZESS) 72 mcg Oral Capsule Take 72 mcg by mouth Every morning   . lisinopriL (PRINIVIL) 20 mg Oral Tablet Take 20 mg by mouth Once a day   . loperamide (IMODIUM) 2 mg Oral Capsule Take 2 mg by mouth Every 4 hours as needed   . loratadine (CLARITIN) 10 mg Oral Tablet Take 1 Tablet (10 mg total) by mouth Once a day   . magnesium oxide (MAG-OX) 400 mg Oral Tablet Take 400 mg by mouth Once a day   . omeprazole (PRILOSEC) 20 mg Oral Capsule, Delayed Release(E.C.) Take 40 mg by mouth Once a day    . oxyCODONE (ROXICODONE) 5 mg Oral Tablet Take 1 Tablet (5 mg total) by mouth Every 6 hours as needed for Pain   . potassium chloride (K-DUR) 20 mEq Oral Tab Sust.Rel. Particle/Crystal Take 20 mEq by mouth Once a day Takes 3 days a week   . prochlorperazine (COMPAZINE) 10 mg Oral Tablet Take 1 Tab (10 mg total) by mouth Four times a day as needed for Nausea/Vomiting   . rOPINIRole (REQUIP) 0.5 mg Oral Tablet  Take 0.5 mg by mouth Every night   . umeclidinium (INCRUSE ELLIPTA) 62.5 mcg/actuation Inhalation Disk with Device Take 1 INHALATION by inhalation Once a day     Social History     Socioeconomic History   . Marital status: Married   Tobacco Use   . Smoking status: Former Research scientist (life sciences)   . Smokeless tobacco: Never Used   . Tobacco comment: quit 10 yrs ago   Vaping Use   . Vaping Use: Never used   Substance and Sexual Activity   .  Alcohol use: Not Currently     Comment: rarely   . Drug use: No   . Sexual activity: Not Currently   Other Topics Concern   . Ability to Walk 1 Flight of Steps without SOB/CP No   . Ability To Do Own ADL's Yes       PHYSICAL EXAMINATION:  General Vitals: BP (!) 148/77   Pulse 73   Temp 36.1 C (96.9 F)   Resp 18   Ht 1.549 m (_0 )   Wt 128 kg (283 lb)   SpO2 100%   BMI 53.47 kg/m       PHYSICAL EXAM:   Consitutional: Appears fatigue  Eyes: EOMI. No discharge. No Jaundice.   ENT: mucous membranes dry. No posterior pharynx lesions.. Neck supple, No palpable masses   Heme/ Lymph: No Cervical, Inguinal, axillary lymh nodes. No Bruising.   Cardiovascular: S1, S2, No murmurs, rubs, or gallops.  Respiratory: RLL diminished sounds and otherwise coarse lung sounds noted.   Abdomen: Normal Bowel Sounds, nontender nondistended. No hepatosplenomegaly.   Musculoskeletal: No Edema to the extremities.   Skin: Normal turgor. No Rashes,skin lesions   Psychiatry: Normal Affect   Neuro: No focal deficits. Alert and Oriented x 3      ENCOUNTER ORDERS:  Orders Placed This Encounter   . Refer to [facility]   . Refer to [facility]   . Refer to External Provider       LABORATORY/RADIOLOGICAL DATA: All pertinent labs/radiology were reviewed.   CBC  Diff   Lab Results   Component Value Date/Time    WBC 8.2 11/07/2020 09:09 AM    HGB 11.6 11/07/2020 09:09 AM    HCT 38.0 11/07/2020 09:09 AM    PLTCNT 258 11/07/2020 09:09 AM    PLTCNT 258 11/07/2020 09:09 AM    RBC 4.95 11/07/2020 09:09 AM    MCV 76.8 (L) 11/07/2020 09:09 AM    MCHC 30.5 (L) 11/07/2020 09:09 AM    MCH 23.4 (L) 11/07/2020 09:09 AM    RDW 19.1 (H) 08/01/2020 08:33 AM    MPV 8.9 11/07/2020 09:09 AM    Lab Results   Component Value Date/Time    PMNS 72 11/07/2020 09:09 AM    LYMPHOCYTES 20 08/01/2020 08:33 AM    EOSINOPHIL 3 08/01/2020 08:33 AM    MONOCYTES 11 11/07/2020 09:09 AM    BASOPHILS 1 11/07/2020 09:09 AM    BASOPHILS <0.10 11/07/2020  09:09 AM    PMNABS 5.90 11/07/2020 09:09 AM    PMNABS 5.90 11/07/2020 09:09 AM    LYMPHSABS 1.15 11/07/2020 09:09 AM    EOSABS 0.19 11/07/2020 09:09 AM    MONOSABS 0.87 11/07/2020 09:09 AM    BASOSABS 0.10 11/10/2014 02:28 PM    BASABS 0.01 09/17/2018 08:24 AM        COMPREHENSIVE METABOLIC PANEL  Lab Results   Component Value Date    SODIUM 137 11/07/2020  POTASSIUM 4.3 11/07/2020    CHLORIDE 103 11/07/2020    CO2 27 11/07/2020    ANIONGAP 7 11/07/2020    BUN 15 11/07/2020    CREATININE 0.86 11/07/2020    GLUCOSE Negative 09/24/2018    CALCIUM 8.9 11/07/2020    PHOSPHORUS 4.2 12/14/2019    ALBUMIN 3.7 11/07/2020    TOTALPROTEIN 6.1 11/07/2020    ALKPHOS 72 11/07/2020    AST 27 11/07/2020    ALT 19 11/07/2020    BILIRUBINCON 0.2 03/22/2018     THYROID STIMULATING HORMONE  Lab Results   Component Value Date    TSH 9.750 (H) 10/20/2020         ASSESSMENT AND PLAN:  70 y.o. female with history of stage III colon cancer status post 5 cycle of FOLFOX now presenting with stage III lung cancer.     2 ICD-10-CM    1. Nodule of left lung  R91.1 Refer to [place] [facility]   2. Adenocarcinoma of right lung (CMS HCC)  C34.91    3. Chronic obstructive pulmonary disease, unspecified COPD type (CMS HCC)  J44.9 Refer to [facility] [place]     Refer to External Provider       1. 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 Carlos Levering, suggested radial EBUS at [facility] to biopsy the left upper lobe lung nodule  CT chest abdomen pelvis did not show disease in the abdomen  Patient does not wish to pursue biopsy  Since the lung nodule remains the only site of disease can consider local radiation, will refer to RAD-ONC, does not want referred for SBRT  Continue durvalumab today  Hold durvalumab if she starts radiation for the left lung nodule    CHEMOTHERAPY:  CYCLE 22/26 DAY   DRUG DOSE TOTAL DOSE % ROUTE SCHEDULE                   Durvalumab 49m/kg  11476m100 IV  Q 2 wks              Loculated right pleural effusion  ORDERED  ultrasound-guided thoracentesis      Anemia  Hemoglobin noted to be 11.4  Iron studies show iron deficiency with iron saturation 6%  Discussed  IV iron infusion, patient will try the pills for some time if does not work then resort to infusion  Patient would like to hold off on EGD/colonoscopy for now     Hypothyroidism  TSH noted to be 9.7  Repeat TSH today    COPD  REFERRED TO PULMONARY, also to pulmonary rehab    Return in about 2 weeks (around 11/21/2020) for Exam/Tx, CBC, CMP, tsh.      SwGala LewandowskyMD  Hematology/Oncology

## 2020-11-08 ENCOUNTER — Telehealth (HOSPITAL_COMMUNITY): Payer: Self-pay | Admitting: Internal Medicine

## 2020-11-08 NOTE — Telephone Encounter (Signed)
Appointment scheduled with Dr. Genene Churn for April 27.2022 @ 11:50am.  Patient's husband informed.

## 2020-11-09 ENCOUNTER — Other Ambulatory Visit: Payer: Self-pay

## 2020-11-09 ENCOUNTER — Encounter (HOSPITAL_COMMUNITY): Payer: Self-pay | Admitting: Radiation Oncology

## 2020-11-09 ENCOUNTER — Inpatient Hospital Stay (HOSPITAL_COMMUNITY)
Admission: RE | Admit: 2020-11-09 | Discharge: 2020-11-09 | Disposition: A | Discharge status: Still In Hospital | Payer: Medicare Other | Source: Ambulatory Visit | Attending: Radiation Oncology | Admitting: Radiation Oncology

## 2020-11-09 DIAGNOSIS — C3431 Malignant neoplasm of lower lobe, right bronchus or lung: Secondary | ICD-10-CM | POA: Insufficient documentation

## 2020-11-09 DIAGNOSIS — C3412 Malignant neoplasm of upper lobe, left bronchus or lung: Secondary | ICD-10-CM | POA: Insufficient documentation

## 2020-11-09 NOTE — Progress Notes (Signed)
PATIENT NAME: [place] NUMBER:  [id number]  DATE OF SERVICE: 11/09/2020  DATE OF BIRTH:  08/13/1951    RADIATION ONCOLOGY    FOLLOWUP NOTE/RE-EVALUATION NOTE:  The patient is known to [place] and is currently a 70 year old lady who was treated with combined modality therapy for a stage T3N2 right lower lobe adenocarcinoma.  She received 6480 cGy to the chest from July 23, 2018, through October 02, 2018.  Her planned dose was 7020.  She failed to complete her prescribed course of therapy and was discharged from our clinic.     She was followed since that time by medical oncology on maintenance durvalumab.  PET-CT scan done in June 2020 revealed right lower lobe lesion had decreased in size and decreased hypermetabolic uptake.  There were postradiation changes and there was no evidence of obvious progression.  In March 2021, CT scan revealed a 5 mm left lingular lesion, questionable metastatic disease. In November 2021 she had slight increased size of the left upper lobe lesion.  CT scan recently on October 27, 2020, revealed a postradiation change in the right hilum, a loculated right pleural effusion, left upper lobe lung nodule which was stable since November 2021.  She had a PET-CT scan ordered, but apparently could not tolerate that.  They also recommended EBUS biopsy of the lung lesion at [facility] and the patient refused.  She also apparently refused stereotactic body radiotherapy at [place] and was referred for treatment here at [facility].      She currently is on oxygen and the medications she was on are in the chart.      The patient had her last dose of durvalumab, I believe 2 days ago and she is scheduled on an every 2 week schedule.  We did review the CT scan performed both in March and in November, revealing a stable lesion in the left upper lobe area and we did go over the possible treatment regimens of the patient with standard  external beam radiotherapy done here at [facility] versus stereotactic body radiotherapy, which would be more preferred to decrease her doses to normal tissues and probably to decrease dose to previously irradiated tissue.      She would like to think about that.  However, we will schedule her for a simulation next week and she can discuss the case with Dr. Nicole Kindred concerning possibly referral to [place] for stereotactic body radiotherapy for the left upper lobe lesion or external beam radiotherapy to be performed here at [facility].      Thank you for allowing [place] to participate in this patient's care.        Harrison Mons, MD              CC:   Gala Lewandowsky, MD   [address]   [place], [place] [address]       DD:  11/09/2020 11:26:04  DT:  11/09/2020 13:25:45 TAW  D#:  [id number]

## 2020-11-09 NOTE — Nurses Notes (Signed)
Pt in room 3 w/family x 1 for Re-NPC from Dr. Jolee Ewing for lung ca. Questionnaire complete, reviewed and in pt's chart to be scanned. Pt denies pain, pt verbalized she is taking Oxycodone 5 mg every 6 hours and  medication helps control her pain. Pt verbalized she had a CT done,  ordered by Dr. Jolee Ewing, that revealed a large hernia under right breast.   Pt had audible wheezing, respirations 15/min.  Breath sounds completed, wheezing to lower right lung noted. Pt verbalized history of Afib which she is taking medication for.     Jimmye Norman, RN      Pt seen by Dr. Daryll Drown.  CT/Sim per Dr. Daryll Drown. Pt to see [facility] first before SIM at which time consent will be completed if treatments are recommended.  Jimmye Norman, RN

## 2020-11-09 NOTE — Nurses Notes (Signed)
11/09/20 1000   Depression Screen   Little interest or pleasure in doing things. 3   Feeling down, depressed, or hopeless 0   PHQ 2 Total 3   Trouble falling or staying asleep, or sleeping too much. 3   Feeling tired or having little energy 2   Poor appetite or overeating 1   Feeling bad about yourself/ that you are a failure in the past 2 weeks? 0   Trouble concentrating on things in the past 2 weeks? 0   Moving/Speaking slowly or being fidgety or restless  in the past 2 weeks? 0   Thoughts that you would be better off DEAD, or of hurting yourself in some way. 0   If you checked off any problems, how difficult have these problems made it for you to do your work, take care of things at home, or get along with other people? Not difficult at all   PHQ 9 Total 9     Patient verbalized she does not think of hurting herself.   She has a higher power that she relies on for comfort.  Pt State " I still have to much to live for, I'm not going to hurt  myself"

## 2020-11-09 NOTE — Nurses Notes (Signed)
11/09/20 1031   COMM (Current Opioid Misuse Measure)   How often have you had trouble thinking clearly or had memory problems? 2   How often do people complain that you are not completing necessary tasks?  0   How often have you had to go to someone other than your prescribing physician to get sufficient pain relief from medications?  0   How often have you taken your medications differently from how they are prescribed?  0   How often have you seriously thought about hurting yourself?  0   How much of your time was spent thinking about opioid medications?  0   How often have you been in an argument? 0   How often have you had trouble controlling your anger? 0   How often have you needed to take pain medications belonging to someone else? 0   How often have you been worried about how you're handling your medications?  0   How often have others been worried about how you're handling your medications?  0   How often have you had to make an emergency phone call or show up at the clinic without an appointment?  0   How often have you gotten angry with people? 0   How often have you had to take more of your medication than prescribed 1   How often have you borrowed pain medication from someone else?  0   How often have you used your pain medicine for symptoms other than for pain?  0   How often have you had to visit the Emergency Room?  0   COMM Score (>=9 is positive) 3

## 2020-11-15 ENCOUNTER — Inpatient Hospital Stay
Admission: RE | Admit: 2020-11-15 | Discharge: 2020-11-15 | Disposition: A | Discharge status: Still In Hospital | Payer: Medicare Other | Source: Ambulatory Visit | Attending: Radiation Oncology | Admitting: Radiation Oncology

## 2020-11-15 ENCOUNTER — Other Ambulatory Visit (HOSPITAL_COMMUNITY): Payer: Self-pay | Admitting: Radiation Oncology

## 2020-11-15 ENCOUNTER — Encounter (HOSPITAL_COMMUNITY): Payer: Self-pay

## 2020-11-15 ENCOUNTER — Other Ambulatory Visit: Payer: Self-pay

## 2020-11-15 DIAGNOSIS — C3412 Malignant neoplasm of upper lobe, left bronchus or lung: Secondary | ICD-10-CM

## 2020-11-15 NOTE — Nursing Note (Signed)
COVID-19 Admission Screen    Low Risk:   Able to Provide asymptomatic History  No recent Travel/ Following Stay at Home  No Sick Contacts  No New Household Conatcts  None    Moderate/High Risk: {None    Test Result:Not Indicated

## 2020-11-16 ENCOUNTER — Telehealth (HOSPITAL_COMMUNITY): Payer: Self-pay

## 2020-11-16 NOTE — Telephone Encounter (Signed)
Navigation: Returned pt call regarding her questions about her f/u appts. I let her know we cancelled her appt with Dr. Clayton Bibles on 11/21/20 as she would like to see her after she completes radiation. I also let her know she will be hearing from rad onc once they complete her tx plan and let her know when she will get started. I told her since the radiation was daily for approx. Two weeks we can provide two gas cards a week and I left them with the secretary at rad onc. Verbalized understanding.

## 2020-11-21 ENCOUNTER — Ambulatory Visit (HOSPITAL_COMMUNITY)
Admission: RE | Admit: 2020-11-21 | Discharge: 2020-11-21 | Disposition: A | Discharge status: Home / Self Care | Payer: Medicare Other | Source: Ambulatory Visit | Admitting: Radiology

## 2020-11-21 ENCOUNTER — Ambulatory Visit (HOSPITAL_BASED_OUTPATIENT_CLINIC_OR_DEPARTMENT_OTHER)
Admission: RE | Admit: 2020-11-21 | Discharge: 2020-11-21 | Disposition: A | Discharge status: Home / Self Care | Payer: Medicare Other | Source: Ambulatory Visit

## 2020-11-21 ENCOUNTER — Ambulatory Visit
Admission: RE | Admit: 2020-11-21 | Discharge: 2020-11-21 | Disposition: A | Discharge status: Home / Self Care | Payer: Medicare Other | Source: Ambulatory Visit | Attending: Internal Medicine | Admitting: Internal Medicine

## 2020-11-21 ENCOUNTER — Encounter (HOSPITAL_COMMUNITY): Payer: Self-pay | Admitting: Internal Medicine

## 2020-11-21 ENCOUNTER — Other Ambulatory Visit: Payer: Self-pay

## 2020-11-21 ENCOUNTER — Inpatient Hospital Stay (HOSPITAL_COMMUNITY): Payer: Medicare Other

## 2020-11-21 ENCOUNTER — Other Ambulatory Visit (HOSPITAL_COMMUNITY): Payer: Medicare Other

## 2020-11-21 ENCOUNTER — Encounter (HOSPITAL_COMMUNITY): Payer: Self-pay

## 2020-11-21 ENCOUNTER — Ambulatory Visit (INDEPENDENT_AMBULATORY_CARE_PROVIDER_SITE_OTHER): Payer: Self-pay | Admitting: Internal Medicine

## 2020-11-21 DIAGNOSIS — J90 Pleural effusion, not elsewhere classified: Secondary | ICD-10-CM | POA: Insufficient documentation

## 2020-11-21 DIAGNOSIS — C3490 Malignant neoplasm of unspecified part of unspecified bronchus or lung: Secondary | ICD-10-CM

## 2020-11-21 LAB — PROTEIN BODY FLUID: PROTEIN BODY FLUID: 2.7 g/dL

## 2020-11-21 LAB — GLUCOSE BODY FLUID: GLUCOSE BODY FLUID: 110 mg/dL (ref 65–125)

## 2020-11-21 LAB — LDH, BODY FLUID: LDH BODY FLUID: 99 U/L

## 2020-11-21 LAB — PT/INR: INR: 1.02 (ref 0.80–1.10)

## 2020-11-21 NOTE — Discharge Instructions (Signed)
Post-op instructions provided and reviewed.     Nurse Navigators (Nurse on Call)  Our nurses are available to assist you 24/7 at [phone]. These registered nurses provide immediate expert advice as a free service to you.  Consider calling the nurse if you feel ill, have questions about your condition, or are unsure if you should seek medical help.    Hospital Operator [phone]

## 2020-11-21 NOTE — Nurses Notes (Signed)
COVID-19 Admission Screen    Low Risk:  None    Moderate/High Risk: {None    Test Result:Not Indicated

## 2020-11-21 NOTE — OR PostOp (Signed)
IR POST-PROCEDURE NOTE  DIAGNOSIS:  Pleural effusion  PROCEDURE:  Ultrasound guided thoracentesis, right  RESULTS:  Successful, CXR pending  PHYSICIAN:  Sabino Gasser, MD  CONDITION:  good  COMPLICATIONS: none  BLOOD LOSS:  none  SPECIMEN REMOVED:       600cc fluid

## 2020-11-23 LAB — FLOW CYTOMETRY, FLUID (NOT CSF)

## 2020-11-24 ENCOUNTER — Ambulatory Visit (HOSPITAL_COMMUNITY): Payer: Medicare Other

## 2020-11-24 LAB — CYTOPATHOLOGY, NON GYN

## 2020-11-25 LAB — STERILE SITE CULTURE AND GRAM STAIN, AEROBIC
FLC: NO GROWTH
GRAM STAIN: NONE SEEN

## 2020-12-08 ENCOUNTER — Encounter (HOSPITAL_COMMUNITY): Payer: Medicare Other | Admitting: Internal Medicine

## 2020-12-19 ENCOUNTER — Other Ambulatory Visit (HOSPITAL_COMMUNITY): Payer: Self-pay | Admitting: Internal Medicine

## 2020-12-19 MED ORDER — OXYCODONE 5 MG TABLET
5.0000 mg | ORAL_TABLET | Freq: Four times a day (QID) | ORAL | 0 refills | Status: DC | PRN
Start: 2020-12-19 — End: 2021-01-12

## 2020-12-21 ENCOUNTER — Inpatient Hospital Stay (HOSPITAL_COMMUNITY)
Admission: RE | Admit: 2020-12-21 | Discharge: 2020-12-21 | Disposition: A | Discharge status: Still In Hospital | Payer: Medicare Other | Source: Ambulatory Visit | Attending: Radiation Oncology | Admitting: Radiation Oncology

## 2020-12-21 ENCOUNTER — Other Ambulatory Visit: Payer: Self-pay

## 2020-12-21 DIAGNOSIS — C3412 Malignant neoplasm of upper lobe, left bronchus or lung: Secondary | ICD-10-CM | POA: Insufficient documentation

## 2020-12-21 DIAGNOSIS — C3431 Malignant neoplasm of lower lobe, right bronchus or lung: Secondary | ICD-10-CM | POA: Insufficient documentation

## 2020-12-22 ENCOUNTER — Inpatient Hospital Stay
Admission: RE | Admit: 2020-12-22 | Discharge: 2020-12-22 | Disposition: A | Discharge status: Still In Hospital | Payer: Medicare Other | Source: Ambulatory Visit | Attending: Radiation Oncology | Admitting: Radiation Oncology

## 2020-12-23 ENCOUNTER — Inpatient Hospital Stay (HOSPITAL_COMMUNITY): Admission: RE | Admit: 2020-12-23 | Payer: Medicare Other | Source: Ambulatory Visit

## 2020-12-23 ENCOUNTER — Other Ambulatory Visit: Payer: Self-pay

## 2020-12-26 ENCOUNTER — Other Ambulatory Visit: Payer: Self-pay

## 2020-12-26 ENCOUNTER — Inpatient Hospital Stay (HOSPITAL_COMMUNITY): Admission: RE | Admit: 2020-12-26 | Payer: Medicare Other | Source: Ambulatory Visit

## 2020-12-27 ENCOUNTER — Inpatient Hospital Stay (HOSPITAL_COMMUNITY): Admission: RE | Admit: 2020-12-27 | Payer: Medicare Other | Source: Ambulatory Visit

## 2020-12-27 ENCOUNTER — Ambulatory Visit (HOSPITAL_COMMUNITY): Payer: Medicare Other | Admitting: Radiation Oncology

## 2020-12-28 ENCOUNTER — Ambulatory Visit (HOSPITAL_COMMUNITY): Payer: Medicare Other

## 2020-12-29 ENCOUNTER — Ambulatory Visit (HOSPITAL_COMMUNITY): Payer: Medicare Other

## 2020-12-30 ENCOUNTER — Ambulatory Visit (HOSPITAL_COMMUNITY): Payer: Medicare Other

## 2021-01-02 ENCOUNTER — Other Ambulatory Visit: Payer: Self-pay

## 2021-01-02 ENCOUNTER — Ambulatory Visit (HOSPITAL_COMMUNITY): Payer: Medicare Other

## 2021-01-03 ENCOUNTER — Inpatient Hospital Stay (HOSPITAL_COMMUNITY)
Admission: RE | Admit: 2021-01-03 | Disposition: A | Discharge status: Still In Hospital | Payer: Medicare Other | Source: Ambulatory Visit

## 2021-01-03 ENCOUNTER — Ambulatory Visit (HOSPITAL_COMMUNITY): Payer: Medicare Other | Admitting: Radiation Oncology

## 2021-01-04 ENCOUNTER — Ambulatory Visit (HOSPITAL_COMMUNITY): Payer: Medicare Other

## 2021-01-05 ENCOUNTER — Ambulatory Visit (HOSPITAL_COMMUNITY): Payer: Medicare Other

## 2021-01-06 ENCOUNTER — Ambulatory Visit (HOSPITAL_COMMUNITY): Payer: Medicare Other

## 2021-01-08 ENCOUNTER — Inpatient Hospital Stay (HOSPITAL_COMMUNITY): Payer: Medicare Other | Admitting: Neurological Surgery

## 2021-01-08 ENCOUNTER — Telehealth (HOSPITAL_COMMUNITY): Payer: Medicare Other | Admitting: Neurology

## 2021-01-08 ENCOUNTER — Inpatient Hospital Stay
Admission: AD | Admit: 2021-01-08 | Discharge: 2021-01-12 | DRG: 025 | Disposition: A | Discharge status: Home Health | Payer: Medicare Other | Source: Other Acute Inpatient Hospital | Attending: Anesthesiology | Admitting: Anesthesiology

## 2021-01-08 ENCOUNTER — Other Ambulatory Visit: Payer: Self-pay

## 2021-01-08 ENCOUNTER — Encounter (HOSPITAL_COMMUNITY): Payer: Self-pay

## 2021-01-08 DIAGNOSIS — Z6841 Body Mass Index (BMI) 40.0 and over, adult: Secondary | ICD-10-CM

## 2021-01-08 DIAGNOSIS — C539 Malignant neoplasm of cervix uteri, unspecified: Secondary | ICD-10-CM | POA: Diagnosis present

## 2021-01-08 DIAGNOSIS — R339 Retention of urine, unspecified: Secondary | ICD-10-CM | POA: Diagnosis not present

## 2021-01-08 DIAGNOSIS — R9431 Abnormal electrocardiogram [ECG] [EKG]: Secondary | ICD-10-CM

## 2021-01-08 DIAGNOSIS — R410 Disorientation, unspecified: Secondary | ICD-10-CM

## 2021-01-08 DIAGNOSIS — E039 Hypothyroidism, unspecified: Secondary | ICD-10-CM | POA: Diagnosis present

## 2021-01-08 DIAGNOSIS — E785 Hyperlipidemia, unspecified: Secondary | ICD-10-CM | POA: Diagnosis present

## 2021-01-08 DIAGNOSIS — C7931 Secondary malignant neoplasm of brain: Secondary | ICD-10-CM | POA: Diagnosis present

## 2021-01-08 DIAGNOSIS — C189 Malignant neoplasm of colon, unspecified: Secondary | ICD-10-CM | POA: Diagnosis present

## 2021-01-08 DIAGNOSIS — Z7901 Long term (current) use of anticoagulants: Secondary | ICD-10-CM

## 2021-01-08 DIAGNOSIS — Z87891 Personal history of nicotine dependence: Secondary | ICD-10-CM

## 2021-01-08 DIAGNOSIS — I4891 Unspecified atrial fibrillation: Secondary | ICD-10-CM

## 2021-01-08 DIAGNOSIS — G935 Compression of brain: Secondary | ICD-10-CM | POA: Diagnosis present

## 2021-01-08 DIAGNOSIS — K589 Irritable bowel syndrome without diarrhea: Secondary | ICD-10-CM | POA: Diagnosis present

## 2021-01-08 DIAGNOSIS — J90 Pleural effusion, not elsewhere classified: Secondary | ICD-10-CM | POA: Diagnosis present

## 2021-01-08 DIAGNOSIS — D497 Neoplasm of unspecified behavior of endocrine glands and other parts of nervous system: Secondary | ICD-10-CM | POA: Diagnosis present

## 2021-01-08 DIAGNOSIS — Z9981 Dependence on supplemental oxygen: Secondary | ICD-10-CM

## 2021-01-08 DIAGNOSIS — G2581 Restless legs syndrome: Secondary | ICD-10-CM | POA: Diagnosis present

## 2021-01-08 DIAGNOSIS — G9389 Other specified disorders of brain: Secondary | ICD-10-CM

## 2021-01-08 DIAGNOSIS — I619 Nontraumatic intracerebral hemorrhage, unspecified: Secondary | ICD-10-CM | POA: Diagnosis present

## 2021-01-08 DIAGNOSIS — C3490 Malignant neoplasm of unspecified part of unspecified bronchus or lung: Secondary | ICD-10-CM

## 2021-01-08 DIAGNOSIS — I482 Chronic atrial fibrillation, unspecified: Secondary | ICD-10-CM | POA: Diagnosis present

## 2021-01-08 DIAGNOSIS — Z7982 Long term (current) use of aspirin: Secondary | ICD-10-CM

## 2021-01-08 DIAGNOSIS — J449 Chronic obstructive pulmonary disease, unspecified: Secondary | ICD-10-CM | POA: Diagnosis present

## 2021-01-08 DIAGNOSIS — Z79899 Other long term (current) drug therapy: Secondary | ICD-10-CM

## 2021-01-08 DIAGNOSIS — R451 Restlessness and agitation: Secondary | ICD-10-CM | POA: Diagnosis not present

## 2021-01-08 DIAGNOSIS — I10 Essential (primary) hypertension: Secondary | ICD-10-CM | POA: Diagnosis present

## 2021-01-08 DIAGNOSIS — G911 Obstructive hydrocephalus: Secondary | ICD-10-CM | POA: Diagnosis present

## 2021-01-08 DIAGNOSIS — Z79890 Hormone replacement therapy (postmenopausal): Secondary | ICD-10-CM

## 2021-01-08 DIAGNOSIS — D496 Neoplasm of unspecified behavior of brain: Principal | ICD-10-CM | POA: Diagnosis present

## 2021-01-08 DIAGNOSIS — M129 Arthropathy, unspecified: Secondary | ICD-10-CM | POA: Diagnosis present

## 2021-01-08 DIAGNOSIS — C3491 Malignant neoplasm of unspecified part of right bronchus or lung: Secondary | ICD-10-CM | POA: Diagnosis present

## 2021-01-08 LAB — CBC WITH DIFF
BASOPHIL #: <0.1 10*3/uL (ref <=0.20)
BASOPHIL %: 1 %
EOSINOPHIL #: <0.1 10*3/uL (ref <=0.50)
EOSINOPHIL %: 1 %
HCT: 41.1 % (ref 34.8–46.0)
HGB: 13 g/dL (ref 11.5–16.0)
IMMATURE GRANULOCYTE #: <0.1 10*3/uL (ref <0.10)
IMMATURE GRANULOCYTE %: 0 % (ref 0–1)
LYMPHOCYTE #: 0.87 10*3/uL — ABNORMAL LOW (ref 1.00–4.80)
LYMPHOCYTE %: 14 %
MCH: 25.4 pg — ABNORMAL LOW (ref 26.0–32.0)
MCHC: 31.6 g/dL (ref 31.0–35.5)
MCV: 80.3 fL (ref 78.0–100.0)
MONOCYTE #: 0.58 10*3/uL (ref 0.20–1.10)
MONOCYTE %: 9 %
MPV: 9.3 fL (ref 8.7–12.5)
NEUTROPHIL #: 4.72 10*3/uL (ref 1.50–7.70)
NEUTROPHIL %: 75 %
PLATELETS: 214 10*3/uL (ref 150–400)
RBC: 5.12 10*6/uL (ref 3.85–5.22)
RDW-CV: 17.4 % — ABNORMAL HIGH (ref 11.5–15.5)
WBC: 6.3 10*3/uL (ref 3.7–11.0)

## 2021-01-08 LAB — PHOSPHORUS: PHOSPHORUS: 2.7 mg/dL (ref 2.3–4.0)

## 2021-01-08 LAB — PRODUCT: PROTHROMBIN COMPLEX CONCENTRATE (PCC, KCENTRA): UNIT DIVISION: 0

## 2021-01-08 LAB — ECG 12-LEAD
Atrial Rate: 86 {beats}/min
Calculated P Axis: 46 degrees
Calculated R Axis: 50 degrees
Calculated T Axis: 93 degrees
PR Interval: 150 ms
QRS Duration: 78 ms
QT Interval: 376 ms
QTC Calculation: 449 ms
Ventricular rate: 86 {beats}/min

## 2021-01-08 LAB — HCG, PLASMA OR SERUM QUANTITATIVE, PREGNANCY: HCG QUANTITATIVE PREGNANCY: 5 m[IU]/mL (ref <15)

## 2021-01-08 LAB — ABO & RH: ABO/RH(D): O POS

## 2021-01-08 LAB — PRODUCT: FFP/PLASMA - UNITS: UNIT DIVISION: 0

## 2021-01-08 LAB — TROPONIN-I: TROPONIN I: 13 ng/L (ref 0–30)

## 2021-01-08 LAB — BASIC METABOLIC PANEL
ANION GAP: 12 mmol/L (ref 4–13)
BUN/CREA RATIO: 22 (ref 6–22)
BUN: 18 mg/dL (ref 8–25)
CALCIUM: 8.9 mg/dL (ref 8.8–10.2)
CHLORIDE: 105 mmol/L (ref 96–111)
CO2 TOTAL: 22 mmol/L — ABNORMAL LOW (ref 23–31)
CREATININE: 0.81 mg/dL (ref 0.60–1.05)
ESTIMATED GFR: 74 mL/min/BSA (ref >=60)
GLUCOSE: 103 mg/dL (ref 65–125)
POTASSIUM: 4.2 mmol/L (ref 3.5–5.1)
SODIUM: 139 mmol/L (ref 136–145)

## 2021-01-08 LAB — TEG, RAPID GLOBAL WITH LYSIS (TRAUMA)
LYS30 (%): 0 % (ref 0.0–2.6)
MA (CRT RAPID): 63.2 mm (ref 52.0–70.0)
MA FIBRINOGEN (CFF): 18.4 mm (ref 15.0–32.0)
R (CK): 11.2 min — ABNORMAL HIGH (ref 4.6–9.1)

## 2021-01-08 LAB — PTT (PARTIAL THROMBOPLASTIN TIME): APTT: 37.5 seconds (ref 24.2–37.5)

## 2021-01-08 LAB — CARCINOEMBRYONIC ANTIGEN: CEA: <1.7 ng/mL (ref <5.0)

## 2021-01-08 LAB — PT/INR
INR: 1.19 (ref 0.80–1.20)
PROTHROMBIN TIME: 13.7 seconds (ref 9.1–13.9)

## 2021-01-08 LAB — MAGNESIUM: MAGNESIUM: 1.8 mg/dL (ref 1.8–2.6)

## 2021-01-08 LAB — ALK PHOS (ALKALINE PHOSPHATASE): ALKALINE PHOSPHATASE: 65 U/L (ref 55–145)

## 2021-01-08 LAB — POC BLOOD GLUCOSE (RESULTS): GLUCOSE, POC: 96 mg/dl (ref 70–105)

## 2021-01-08 LAB — ALPHA FETOPROTEIN (AFP) TUMOR MARKER: AFP TUMOR MARKER: 2 ng/mL (ref 2–9)

## 2021-01-08 MED ORDER — SODIUM CHLORIDE 0.9 % (FLUSH) INJECTION SYRINGE
2.0000 mL | INJECTION | Freq: Three times a day (TID) | INTRAMUSCULAR | Status: DC
Start: 2021-01-08 — End: 2021-01-12
  Administered 2021-01-08 – 2021-01-10 (×6): 0 mL
  Administered 2021-01-10: 2 mL
  Administered 2021-01-11: 5 mL
  Administered 2021-01-11: 2 mL
  Administered 2021-01-11: 0 mL
  Administered 2021-01-12: 5 mL

## 2021-01-08 MED ORDER — POLYETHYLENE GLYCOL 3350 17 GRAM ORAL POWDER PACKET
17.0000 g | Freq: Every day | ORAL | Status: DC
Start: 2021-01-09 — End: 2021-01-12
  Administered 2021-01-09 – 2021-01-11 (×3): 17 g via ORAL
  Administered 2021-01-12: 0 g via ORAL
  Filled 2021-01-08 (×4): qty 1

## 2021-01-08 MED ORDER — MAGNESIUM HYDROXIDE 400 MG/5 ML ORAL SUSPENSION
15.0000 mL | Freq: Three times a day (TID) | ORAL | Status: DC
Start: 2021-01-08 — End: 2021-01-12
  Administered 2021-01-08 – 2021-01-10 (×5): 1200 mg via ORAL
  Administered 2021-01-10: 0 mg via ORAL
  Administered 2021-01-10: 1200 mg via ORAL
  Administered 2021-01-11 (×2): 0 mg via ORAL
  Administered 2021-01-11: 1200 mg via ORAL
  Administered 2021-01-12: 0 mg via ORAL
  Filled 2021-01-08: qty 30
  Filled 2021-01-08: qty 600
  Filled 2021-01-08 (×6): qty 30

## 2021-01-08 MED ORDER — DIPHENHYDRAMINE 50 MG CAPSULE
50.0000 mg | ORAL_CAPSULE | Freq: Once | ORAL | Status: AC
Start: 2021-01-09 — End: 2021-01-09
  Administered 2021-01-09: 50 mg via ORAL
  Filled 2021-01-08: qty 1

## 2021-01-08 MED ORDER — SODIUM CHLORIDE 0.9 % (FLUSH) INJECTION SYRINGE
2.0000 mL | INJECTION | INTRAMUSCULAR | Status: DC | PRN
Start: 2021-01-08 — End: 2021-01-12

## 2021-01-08 MED ORDER — SODIUM CHLORIDE 0.9 % INTRAVENOUS SOLUTION
INTRAVENOUS | Status: DC
Start: 2021-01-08 — End: 2021-01-11
  Administered 2021-01-09 – 2021-01-11 (×3): 0 mL via INTRAVENOUS

## 2021-01-08 MED ORDER — FENTANYL (PF) 50 MCG/ML INJECTION SOLUTION
100.0000 ug | Freq: Once | INTRAMUSCULAR | Status: DC | PRN
Start: 2021-01-08 — End: 2021-01-09
  Filled 2021-01-08: qty 2

## 2021-01-08 MED ORDER — MIDAZOLAM 1 MG/ML INJECTION SOLUTION
2.0000 mg | Freq: Once | INTRAMUSCULAR | Status: DC | PRN
Start: 2021-01-08 — End: 2021-01-09
  Filled 2021-01-08: qty 2

## 2021-01-08 MED ORDER — ONDANSETRON HCL (PF) 4 MG/2 ML INJECTION SOLUTION
4.0000 mg | Freq: Four times a day (QID) | INTRAMUSCULAR | Status: DC | PRN
Start: 2021-01-08 — End: 2021-01-12

## 2021-01-08 MED ORDER — ACETAMINOPHEN 325 MG TABLET
650.0000 mg | ORAL_TABLET | ORAL | Status: DC | PRN
Start: 2021-01-08 — End: 2021-01-12
  Administered 2021-01-09 – 2021-01-12 (×6): 650 mg via ORAL
  Filled 2021-01-08 (×6): qty 2

## 2021-01-08 MED ORDER — HUM PROTHROMBIN CPLX(PCC)4FACT 1,000 UNIT (800-1,240 UNIT) IV SOLUTION
25.0000 [IU]/kg | Freq: Once | INTRAVENOUS | Status: AC
Start: 2021-01-08 — End: 2021-01-08
  Administered 2021-01-08: 3231 [IU] via INTRAVENOUS
  Filled 2021-01-08: qty 175

## 2021-01-08 MED ORDER — LORAZEPAM 2 MG/ML INJECTION SOLUTION
INTRAMUSCULAR | Status: AC
Start: 2021-01-08 — End: 2021-01-08
  Administered 2021-01-08: 2 mg via INTRAVENOUS
  Filled 2021-01-08: qty 1

## 2021-01-08 MED ORDER — LORAZEPAM 2 MG/ML INJECTION SOLUTION
2.0000 mg | INTRAMUSCULAR | Status: AC
Start: 2021-01-08 — End: 2021-01-08

## 2021-01-08 MED ORDER — SENNOSIDES 8.6 MG-DOCUSATE SODIUM 50 MG TABLET
1.0000 | ORAL_TABLET | Freq: Every day | ORAL | Status: DC
Start: 2021-01-09 — End: 2021-01-12
  Administered 2021-01-09 – 2021-01-11 (×3): 1 via ORAL
  Administered 2021-01-12: 0 via ORAL
  Filled 2021-01-08 (×4): qty 1

## 2021-01-08 MED ORDER — PHARMACIST FYI - KCENTRA ORDERED
Freq: Every day | Status: DC | PRN
Start: 2021-01-08 — End: 2021-01-12

## 2021-01-08 MED ORDER — CEFTRIAXONE 2 GRAM/50 ML IN DEXTROSE (ISO-OSM) INTRAVENOUS PIGGYBACK
2.0000 g | INJECTION | INTRAVENOUS | Status: AC
Start: 2021-01-08 — End: 2021-01-08
  Administered 2021-01-08: 0 g via INTRAVENOUS
  Administered 2021-01-08: 2 g via INTRAVENOUS
  Filled 2021-01-08: qty 50

## 2021-01-08 MED ORDER — HYDRALAZINE 20 MG/ML INJECTION SOLUTION
10.0000 mg | INTRAMUSCULAR | Status: DC | PRN
Start: 2021-01-08 — End: 2021-01-12
  Administered 2021-01-09 – 2021-01-11 (×5): 10 mg via INTRAVENOUS
  Filled 2021-01-08 (×5): qty 1

## 2021-01-08 MED ORDER — PREDNISONE 50 MG TABLET
50.0000 mg | ORAL_TABLET | Freq: Four times a day (QID) | ORAL | Status: AC
Start: 2021-01-08 — End: 2021-01-09
  Administered 2021-01-08: 50 mg via ORAL
  Administered 2021-01-09: 0 mg via ORAL
  Administered 2021-01-09: 50 mg via ORAL
  Filled 2021-01-08 (×3): qty 1

## 2021-01-08 MED ORDER — SODIUM CHLORIDE 0.9 % IV BOLUS
40.0000 mL | INJECTION | Freq: Once | Status: AC | PRN
Start: 2021-01-08 — End: 2021-01-09

## 2021-01-08 NOTE — Nurses Notes (Signed)
Pt allergic to contrast for CT. Spoke with [facility] and they have ordered the 13 hour CT prep.

## 2021-01-08 NOTE — Progress Notes (Signed)
I have seen this patient with the resident/APP.  I reviewed the resident/APP's note.  I agree with the findings and plan of care as documented in the resident/APP's note.  Any exceptions/additions are edited/noted.    Kelsey Rumble, MD   Assistant Professor  [facility] [place] NOTE      Kelsey [name], Kelsey [name], 70 y.o. female  Date of Admission:  01/08/2021  Date of Service: 01/09/2021  Date of Birth:  04/18/1951    Referring Physician:  Henrine Screws, FNP    Chief Complaint: Confusion  Subjective:   Tolerated EVD placement well    Vital Signs:  Temp (24hrs) Max:36.8 C (70.6 F)      Systolic (23JSE), GBT:517 , Min:97 , OHY:073     Diastolic (71GGY), IRS:85, Min:61, Max:106    Temp  Avg: 36.8 C (98.2 F)  Min: 36.8 C (98.2 F)  Max: 36.8 C (98.2 F)  MAP (Non-Invasive)  Avg: 92.5 mmHG  Min: 75 mmHG  Max: 115 mmHG  Pulse  Avg: 92.8  Min: 83  Max: 106  Resp  Avg: 22.3  Min: 15  Max: 28  SpO2  Avg: 94.3 %  Min: 88 %  Max: 99 %       Today's Physical Exam:  General: Appears stated age, NAD  Cardio: Radial pulses intact bilaterally  ENT: Trachea midline  Respiratory: Regular respirations  Skin: Skin pink    Neurologic Exam:  A&Ox1 (self)  GCS 4 6 4   Fluent speech  Limited fund of knowledge  Appropriate attention span & concentration  Limited recent and remote memory  CN 2 PERRL  CN 3 4 6  EOMI  CN 7 Face symmetric  CN 8 Hearing grossly intact  CN 11 shrug symmetric  CN 12 Tongue midline  Muscle Strength 5/5 BUE and BLE  Muscle tone WNL    R frontal EVD secured in place    Current Medications:  acetaminophen (TYLENOL) tablet, 650 mg, Oral, Q4H PRN  diphenhydrAMINE (BENADRYL) capsule, 50 mg, Oral, Once  fentaNYL (SUBLIMAZE) 50 mcg/mL injection, 100 mcg, Intravenous, Once PRN  hydrALAZINE (APRESOLINE) injection 10 mg, 10 mg, Intravenous, Q4H PRN  magnesium hydroxide (MILK OF MAGNESIA) 400mg  per 50mL oral liquid, 15 mL, Oral, 3x/day  midazolam (VERSED) 1 mg/mL injection, 2  mg, Intravenous, Once PRN  NS bolus infusion 40 mL, 40 mL, Intravenous, Once PRN  NS flush syringe, 2-6 mL, Intracatheter, Q8HRS   And  NS flush syringe, 2-6 mL, Intracatheter, Q1 MIN PRN  NS premix infusion, , Intravenous, Continuous  ondansetron (ZOFRAN) 2 mg/mL injection, 4 mg, Intravenous, Q6H PRN  Pharmacist FYI - [employer] Ordered, , Does not apply, Daily PRN  polyethylene glycol (MIRALAX) oral packet, 17 g, Oral, Daily  predniSONE (DELTASONE) tablet, 50 mg, Oral, Q6H  sennosides-docusate sodium (SENOKOT-S) 8.6-50mg  per tablet, 1 Tablet, Oral, Daily      Assessment/ Plan:  Active Hospital Problems    Diagnosis    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 PPD0     -- R frontal EVD @ 10 cmH2O   -- CSF MWF   -- Imaging:                --  MRI brain wwo: ORDERED                -- MRI cervical wwo: ORDERED                -- MRI thoracic wwo: ORDERED                -- MRI lumbar wwo: ORDERED                -- CT CAP w: ORDERED                -- CT brain wo (outside facility): on synapse, reviewed  -- Pain/spasm control: Tylenol PRN  -- Diet: regular  -- Bowel regimen, last BM 01/08/21  -- Abx: none  -- Activity: ambulate as tolerated w/ assist  -- PT/OT: ordered   -- DVT ppx: SCDs/Venodynes  -- Consults:                 None  -- Lines/Drains: none  -- Wound: none  -- Disposition: none    Kelsey D. Ezzard Standing, MD  PGY-1  Neurosurgery  01/09/2021,02:06  Pager #[phone]

## 2021-01-08 NOTE — Consults (Signed)
Telestroke E-Consult Note    Kelsey [name], Kelsey [name], 70 y.o. female  MRN:  [id number]     Date of Admission:  (Not on file)  Date of Birth:  1950-08-31  Date of Service:  01/08/2021      Location of Consultation:  [facility], [address] [place], [place] [address]  Telestroke Consult Requested by Helane Gunther        Chief Complaint: encephalopathy    History of Present Illness:  Kelsey [name] is a 70 y.o. female who presented initially for 3 separate malignancies (lung, cervical, colon) who had acute encephalopathy this morning. CT showed abnormalities. Patient was up and trying to leave the unit. No headache.       I personally reviewed the following images.  My findings and interpretations are as follows:  CT brain w/o contrast  ventriculomegaly with dilation of 3rd ventricle. Likely malignancy in brainstem compressing aqueduct  CTA (if available)  N/a    Assessment:    1.  Ischemic:  No    Recommendations:  1. Transfer for higher level of care:  yes    Recommendations given to facility physician.     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 Per provider, patient is desiring full measures for her 3x cancer treatment. Briefly discussed case with NCCU attending and informed MARS line.   On the day of the encounter, a total of  7 minutes was spent on this patient encounter including review of historical information, discussion with provider, reviewing imaging, and documentation.    Coding  99446: 5-10 minutes  99447: 11-20 minutes  99448: 21-30 minutes  99449: 31 or more minutes    Leda Quail, MD  01/08/2021, 18:14

## 2021-01-08 NOTE — H&P (Signed)
INPATIENT NEUROCRITICAL HISTORY & PHYSICAL      Kelsey [name] is a 70 y.o. female admitted to the [facility] service for confusion.    Date of Admission:  01/08/2021  Admission Source:  Transfer  Advance Directives:  None-Discussed  Hospice involvement prior to admission?  Not applicable     Subjective:     History Provided By:  health care provider and history reviewed via medical record     History of Present Illness: Per NSGY H&P:    Shandricka Monroy is a 70 y.o., White female with PMH of A-fib and known malignancy (lung, cervical, colon) on Eliquis who presented to outside facility with emory changes. She underwent CT brain that showed a brain mass along the pineal region. Patient is unable to provide a detailed history, attempted to reach husband over the phone with no success. Reportedly, last took Eliquis today.        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.9, adult (CMS HCC)       Past Medical History Past Surgical History   Past Medical History:   Diagnosis Date    A-fib (CMS HCC)     Arthropathy, unspecified, site unspecified     Asthma     Cancer (CMS [place])     cervical    Cataract     Colon cancer (CMS [place]) 07/06/2014    COPD (chronic obstructive pulmonary disease) (CMS HCC)     Fistula     HTN (hypertension)     Hyperlipidemia     Lung mass     with pleural effusion    Obesity     Oxygen dependent     2 liters nc     Sleep apnea     Ventral hernia         Past Surgical History:   Procedure Laterality Date    ABSCESS DRAINAGE      BOWEL RESECTION      CATARACT EXTRACTION      HX CERVICAL CONE BIOPSY       HX CESAREAN SECTION      HX GASTRIC BYPASS      25 years ago    [place] (x2)    HX HYSTERECTOMY      late 68s    HX LAP BANDING      2013    HX LAPAROTOMY      2013 to remove lap band             Family History Social History - Substance Use   Family History   Problem Relation Age of Onset    Diabetes Mother      Heart Attack Mother     Diabetes Maternal Grandmother     Diabetes Maternal Grandfather     Diabetes Father     Stroke Father     Diabetes Sister     Social History     Tobacco Use    Smoking status: Former Smoker    Smokeless tobacco: Never Used    Tobacco comment: quit 10 yrs ago   Vaping Use    Vaping Use: Never used   Substance Use Topics    Alcohol use: Not Currently     Comment: rarely    Drug use: No      Medical, Surgical, Family, and Social History:  were reviewed and edited  above.     Home Medications:   No outpatient medications have been marked as taking for the 01/08/21 encounter [facility] Encounter).        Allergies:  Allergies   Allergen Reactions    Shellfish Containing Products Shortness of Breath, Rash and Itching     scallops    Vancomycin Shortness of Breath    Barium Iodide     Iodine And Iodide Containing Products      Itching, shortness of breath         Review of Systems:  A complete review of systems was unable to be obtained due to patient condition and unable to reach family.       Objective:     Most Recent Vital Signs:  Vital Signs Range Most Recent   Temperature Temp  Avg: 36.8 C (98.2 F)  Min: 36.8 C (98.2 F)  Max: 36.8 C (98.2 F) Temperature: 36.8 C (98.2 F)   Heart Rate Pulse  Avg: 87.2  Min: 84  Max: 92 Heart Rate: 92   Blood Pressure BP  Min: 127/91  Max: 152/72 BP (Non-Invasive): (!) 148/84   Respiration Resp  Avg: 23.9  Min: 18  Max: 28 Respiratory Rate: 18   SpO2 SpO2  Avg: 95.6 %  Min: 88 %  Max: 99 % SpO2: 95 %     Physical Exam:      Gen: No apparent distress     HENT:  Normocephalic, atraumatic.  Sclera anicteric.  No oropharyngeal lesions.     Ophthalmic:  Pupils equal, briskly reactive to light.  Fundoscopic exam technically difficult to obtain, red reflex present.     Neck:  Carotids without bruits.     CV: Regular rate and rhythm.  No murmurs.     Pulmonary: Normal work of breathing.  Clear to auscultation bilaterally without wheezes or rales.     Psych:  Appropriate mood & affect.     Neurological Exam:      Mental Status Exam:     Level of Consciousness:  Alert and oriented to person, place, and time.      GCS - Eye:  4 - Spontaneous eye opening      GCS - Verbal:  4 - Confused      GCS - Motor:  6 - Following commands   Memory:  intact to recent events   Attention:  Awake and attentive on entering room.   Knowledge:  Intact   Language:  Language expression and comprehension intact   Speech: No dysarthria     Cranial Nerves:    CN2:  Visual fields intact bilaterally   CN3/4/6:  Ocular movements aligned in all fields of gaze without nystagmus.   CN5:  Facial sensation intact to soft and cool touch.   CN7:  Facial activation symmetric.   CN8:  Hearing intact to finger rub bilaterally.   CN9/10:  Palate elevates symmetrically.   CN11:  shoulder shrug symmetric   CN12:  Tongue protrudes midline.     Musculoskeletal:    Bulk/Tone/Tremor:  Normal muscle bulk and tone throughout. No tremor is appreciated.  Pronator Drift:  There is no pronator drift or satelliting.   Strength:   Right Arm:  Strength 5/5, generating full resistance in all muscle groups tested.   Left Arm:  Strength 5/5, generating full resistance in all muscle groups tested.   Right Leg:  Strength 5/5, generating full resistance in all muscle groups tested.   Left Leg:  Strength 5/5, generating full resistance in all muscle groups tested.     Sensation: Able to adequately distinguish soft and cool touch in all four extremities.      Reflexes: Reflexes globally 2/4 in biceps, triceps, patellar, and ankle; no focal asymmetry.  Toes downgoing bilaterally.     Coordination:  Unable to assess due to patient cooperation.     Gait:  Unable to assess due to intubation and/or patient cooperation     Pertinent Labs:  Complete Blood Count  Recent Labs     01/08/21  1932   WBC 6.3   HGB 13.0   PLTCNT 390    Metabolic Panel  Recent Labs     01/08/21  1932   SODIUM 139   POTASSIUM 4.2   CHLORIDE 105   CO2 22*   BUN 18    CREATININE 0.81   CALCIUM 8.9   MAGNESIUM 1.8   PHOSPHORUS 2.7      Liver Function Test  No results for input(s): ALT, AST, TOTBILIRUBIN, ALBUMIN, ALKPHOS, AMMONIA in the last 72 hours. 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 female admitted to the [facility] for Pineal mass.      DNR Status this admission:  Full Code  Palliative/Supportive Care consulted?  no  Hospice Consulted?  Not applicable    Current Comorbid Conditions - Neurology Consult  Obstructive Hydrocephalus:  yes  Coma (GCS less than 8):Coma -Not applicable  TIA not applicable  Encephalopathy:  no  Encephalitis-Not applicable  Seizure-Not applicable   Respiratory Failure/Other-Not applicable  No  Coagulopathy Coagulopathy due to Medication:  Eliquis  N/A    DVT/PE Prophylaxis: SCDs/ Venodynes/Impulse boots     Plan:     Neurologic:     Pineal Tumor with associated Hydrocephalus: Patient with known malignancy of lung, cervical, colon    Neurological exam notable for the following:  without focal deficits.  Neurological exam unchanged..  Monitoring:  Continue q1h Neurochecks & Vitals, STAT Head CT for changes in exam  Diagnostics:  Neuro-imaging:  CT Brain on Synapse  MRI Brain and Spine w/wo ordered per primary  Plan for EVD placement with NSGY  CT Brain post EVD placement   CT CAP w/wo IV and PO contrast to assess for further malignancy  Therapeutics:  Continue Physical, Occupational, and Speech Therapy          Cardiovascular:     Status:  NSR, Hemodynamically stable. PMH of AFib on Eliquis  Vitals:  Heart Rate Pulse  Avg: 87.2  Min: 84  Max: 92 Heart Rate: 92   Blood Pressure BP  Min: 127/91  Max:  152/72 BP (Non-Invasive): (!) 148/84   Cardiac Labs:  Lab Results   Component Value Date    TROPONINI 13 01/08/2021    BNP 106 (H) 12/01/2019      Neuroscience blood pressure goals: Maintain Normotension- SBP < 160  Use Hydralazine/Labetalol per parameters.  Continue current antihypertensive regimen:  hold home medications unless hypertensive.  Afib, chronic  EKG: NSR, Troponin 13  Continue Home Flecainide 100 mg BID  Hold home Eliquis for drain placement        Pulmonary:     Status:  adequate oxygenation/ventilation on current regimen.  Oxygen Requirement:       Most Recent Arterial Blood Gas:  Lab Results   Component Value Date    PH 7.37 12/10/2019    PCO2 41.00 12/10/2019    PO2 34.0 12/10/2019      Continue Incentive Spirometry   Maintain SpO2 > 90%        Gastrointestinal     Most recent LFTs:  Lab Results   Component Value Date    AST 27 11/07/2020    ALT 19 11/07/2020    ALKPHOS 72 11/07/2020    INR 1.19 01/08/2021      Diet:   NPO  GI PPx:  not indicated (patient not intubated, on steroids, or septic)  Bowel Regimen:  Miralax 17g daily (hold for loose stools) and Senna 8.6mg  daily (hold for loose stools)        Renal:   Status:  No evidence of acute kidney injury.  Continue to monitor I/Os.  Most recent Renal Labs:  Lab Results   Component Value Date    SODIUM 139 01/08/2021    POTASSIUM 4.2 01/08/2021    BUN 18 01/08/2021    CREATININE 0.81 01/08/2021    CALCIUM 8.9 01/08/2021    MAGNESIUM 1.8 01/08/2021    PHOSPHORUS 2.7 01/08/2021      IV Fluids:  Normal Saline at 30mL/h  Resupplement electrolytes prn        Hematology:     Most recent Hematology Labs:  Lab Results   Component Value Date    HGB 13.0 01/08/2021    MCV 80.3 01/08/2021    MCH 25.4 (L) 01/08/2021    PLTCNT 214 01/08/2021      Hemoglobin:  Hemoglobin stable   Platelet Count:  Platelets adequate.  DVT Prophylaxis:  SCDs, anticoagulation held due to drain placement. .   Hold home Eliquis for EVD- will discuss reversal with Primary prior to  procedure  PT, PTT, INR, TEG ordered      Endocrine:       Most recent Endocrine Labs:  Lab Results   Component Value Date    GLUIP 96 01/08/2021    GLUIP 107 08/18/2020    GLUIP 107 12/01/2019    GLUIP 94 03/06/2019    GLUCOSENF 103 01/08/2021      Neuroscience Glycemic Goals < 180  Glycemic regimen:  No history of diabetes.  Continue Insulin Sliding Scale.        Infectious Disease:     Status:  Afebrile, normal WBC, no evidence of infection.  Temperature Profile:  Temperature Temp  Avg: 36.8 C (98.2 F)  Min: 36.8 C (98.2 F)  Max: 36.8 C (98.2 F) Temperature: 36.8 C (98.2 F)     WBC Count  Lab Results   Component Value Date    WBC 6.3 01/08/2021    WBC 8.2 11/07/2020    WBC 8.8 10/20/2020    PMNS 75 01/08/2021    LYMPHO 14 01/08/2021      Monitor qSOFA criteria        Other Systems:     Lines/Drains/Access:  Patient Lines/Drains/Airways Status       Active Line / Dialysis Catheter / Dialysis Graft / Drain / Airway / Wound       Name  Placement date Placement time Site Days    Peripheral IV Ultrasound guided;Extended dwell catheter Left Basilic  (medial side of arm) 01/08/21  2056  -- less than 1    Implantable Port Right Chest 09/25/18  1100  -- 836    Wound (Non-Surgical) Right Back 05/06/18  1335  -- 978                     Emergency Contact:  Extended Emergency Contact Information  Primary Emergency Contact: Vanriper,HOWARD  Address: [place], [place] [address] [place] Phone: [phone]  Work Phone: [phone]  Mobile Phone: [phone]  Relation: Husband  Preferred language: English     Restraints:  None  Skin:  intact.  Activity:  Out of bed to chair  Disposition:  Remain in NSICU due to neuro checks overnight, EVD placement .      Code Status:  FULL CODE          Critical Care Attestation:  I was present at the bedside of this critically ill patient exclusive of procedures that are documented elsewhere.  My services were independent and  non-duplicative of other practioners of other specialties (non-Neurocritical Care).    This patient suffers from failure or dysfunction of  Neurological system(s).     The care of this patient was in regard to managing (a) conditions(s) that has a high probability of sudden, clinically significant, or life-threatening deterioration and required a high degree of Provider attention and direct involvement to intervene urgently. Data review and care planning was performed in direct proximity of the patient, examination was obviously performed in direct contact with the patient. All of this time was exclusive of procedure which will be documented elsewhere in the chart.   My critical care time involved full attention to the patient's condition and included:   Review of nursing notes and/or old charts   Review of medications, allergies, and vital signs   Documentation time   Consultant collaboration on findings and treatment options   Care, transfer of care, and discharge plans   Ordering, interpreting, and reviewing diagnostic studies/ lab tests   Obtaining necessary history from family, EMS, nursing home staff and/or treating physicians     Total Critical Care Time: 35 minutes    Deretha Emory, APRN,FNP-BC 01/08/2021, 22:30      Attending Attestation:     Danice Goltz, MD  01/09/2021, 08:28

## 2021-01-08 NOTE — H&P (Signed)
[facility]    Department of Neurosurgery      I have seen this patient in conjuction with the Resident/APP.  Please see their note for details of the visit.  I agree with their assessment/plan with the addition of the following summary.    Assessment/Plan:    Active Hospital Problems    Diagnosis   . 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  She will need a ventriculostomy.  I will ask one of my partners to manage her pineal region tumor definitely in my absence.    Kelsey Rumble, MD  Assistant Professor  [place] Department of Neurosurgery   01/09/2021 12:00 PM     Neurosurgery   History and Physical      [name], Kelsey, 70 y.o. 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 She underwent CT brain that showed a brain mass along the pineal region. Patient is unable to provide a detailed history, attempted to reach husband over the phone with no success. Reportedly, last took Eliquis today.     Admission Source: outside hospital  Advance Directives:  None-Discussed  Hospice involvement prior to admission?  Not applicable    ROS:   Other than ROS in the HPI, all other systems were negative.    PAST MEDICAL/ FAMILY/ SOCIAL HISTORY:   Past Medical History:   Diagnosis Date   . A-fib (CMS HCC)    . Arthropathy, unspecified, site unspecified    . Asthma    . Cancer (CMS HCC)     cervical   . Cataract    . Colon cancer (CMS [facility]) 07/06/2014   . COPD (chronic obstructive pulmonary disease) (CMS HCC)    . Fistula     . HTN (hypertension)    . Hyperlipidemia    . Lung mass     with pleural effusion   . Obesity    . Oxygen dependent     2 liters nc    . Sleep apnea    . Ventral hernia          Medications Prior to Admission     Prescriptions    albuterol sulfate (PROVENTIL OR VENTOLIN OR PROAIR) 90 mcg/actuation Inhalation HFA Aerosol Inhaler    INHALE 1 TO 2 PUFFS BY MOUTH EVERY 6 HOURS AS NEEDED    albuterol sulfate (PROVENTIL) 2.5 mg /3 mL (0.083 %) Inhalation Solution for Nebulization    3 mL (2.5 mg total) by Nebulization route Every 6 hours    apixaban (ELIQUIS) 5 mg Oral Tablet    Take 1 Tab (5 mg total) by mouth Twice daily    aspirin (ECOTRIN) 81 mg Oral Tablet, Delayed Release (E.C.)    Take 325 mg by mouth Once a day    atorvastatin (LIPITOR) 10 mg Oral Tablet    Take 10 mg by mouth Once a day    BREZTRI AEROSPHERE 160-9-4.8 mcg/actuation Inhalation [employer] Aerosol Inhaler  INHALE 2 PUFFS BY MOUTH TWICE DAILY    carvediloL (COREG) 3.125 mg Oral Tablet    Take 3.125 mg by mouth Twice daily with food    diphenhydrAMINE (BENADRYL) 50 mg Oral Capsule    Take 1 capsule by mouth 1 hour prior to CT scan.    docusate sodium (COLACE) 100 mg Oral Capsule    Take 100 mg by mouth Three times a day as needed     ferrous sulfate (FEOSOL) 325 mg (65 mg iron) Oral Tablet    Take 1 Tablet (325 mg total) by mouth Once a day    flecainide (TAMBOCOR) 100 mg Oral Tablet    Take 1 Tab (100 mg total) by mouth Twice daily    levothyroxine (SYNTHROID) 112 mcg Oral Tablet    Take 1 Tablet (112 mcg total) by mouth Every morning    linaCLOtide (LINZESS) 72 mcg Oral Capsule    Take 72 mcg by mouth Every morning    lisinopriL (PRINIVIL) 20 mg Oral Tablet    Take 20 mg by mouth Once a day    loperamide (IMODIUM) 2 mg Oral Capsule    Take 2 mg by mouth Every 4 hours as needed    loratadine (CLARITIN) 10 mg Oral Tablet    Take 1 Tablet (10 mg total) by mouth Once a day    magnesium oxide (MAG-OX) 400 mg Oral Tablet    Take 400 mg by mouth Once a day     omeprazole (PRILOSEC) 20 mg Oral Capsule, Delayed Release(E.C.)    Take 40 mg by mouth Once a day     oxyCODONE (ROXICODONE) 5 mg Oral Tablet    Take 1 Tablet (5 mg total) by mouth Every 6 hours as needed for Pain    potassium chloride (K-DUR) 20 mEq Oral Tab Sust.Rel. Particle/Crystal    Take 20 mEq by mouth Once a day Takes 3 days a week    prochlorperazine (COMPAZINE) 10 mg Oral Tablet    Take 1 Tab (10 mg total) by mouth Four times a day as needed for Nausea/Vomiting    rOPINIRole (REQUIP) 0.5 mg Oral Tablet    Take 0.5 mg by mouth Every night         Allergies   Allergen Reactions   . Shellfish Containing Products Shortness of Breath, Rash and Itching     scallops   . Vancomycin Shortness of Breath   . Barium Iodide    . Iodine And Iodide Containing Products      Itching, shortness of breath      Past Surgical History:   Procedure Laterality Date   . ABSCESS DRAINAGE     . BOWEL RESECTION     . CATARACT EXTRACTION     . HX CERVICAL CONE BIOPSY      . HX CESAREAN SECTION     . HX GASTRIC BYPASS      25 years ago   . [place] (x2)   . HX HYSTERECTOMY      late 1990s   . HX LAP BANDING      2013   . HX LAPAROTOMY      2013 to remove lap band         Social History     Tobacco Use   . Smoking status: Former Research scientist (life sciences)   . Smokeless tobacco: Never Used   . Tobacco comment: quit 10 yrs ago   Vaping Use   .  Vaping Use: Never used   Substance Use Topics   . Alcohol use: Not Currently     Comment: rarely   . Drug use: No       Past Family History:   Family Medical History:     Problem Relation (Age of Onset)    Diabetes Mother, Maternal Grandmother, Maternal Grandfather, Father, Sister    Heart Attack Mother    Stroke Father            PHYSICAL EXAMINATION:     Constitutional  Temperature: 36.8 C (98.2 F)  Heart Rate: 92  BP (Non-Invasive): (!) 148/84  Respiratory Rate: 18  SpO2: 95 %  General: Appears stated age, NAD  Cardio: Radial pulses intact bilaterally  ENT: Trachea midline  Respiratory:  Regular respirations  Skin: Skin pink    Neurologic Exam:  A&Ox1 (self)  GCS 4 6 4   Fluent speech  Diminished fund of knowledge  Appropriate attention span & concentration  Diminished recent and remote memory  CN 2 PERRL  CN 3 4 6  EOMI  CN 7 Face symmetric  CN 8 Hearing grossly intact  CN 11 shrug symmetric  CN 12 Tongue midline  Muscle Strength 5/5 BUE and BLE  Muscle tone WNL  SILT BUE and BLE  No drift  No hoffman   Plantars downgoing  No clonus    Labs Ordered/ Reviewed : (Please indicate ordered or reviewed)  Reviewed: Labs:  Lab Results Today:    Results for orders placed or performed during the hospital encounter of 01/08/21 (from the past 24 hour(s))   BASIC METABOLIC PANEL   Result Value Ref Range    SODIUM 139 136 - 145 mmol/L    POTASSIUM 4.2 3.5 - 5.1 mmol/L    CHLORIDE 105 96 - 111 mmol/L    CO2 TOTAL 22 (L) 23 - 31 mmol/L    ANION GAP 12 4 - 13 mmol/L    CALCIUM 8.9 8.8 - 10.2 mg/dL    GLUCOSE 103 65 - 125 mg/dL    BUN 18 8 - 25 mg/dL    CREATININE 0.81 0.60 - 1.05 mg/dL    BUN/CREA RATIO 22 6 - 22    ESTIMATED GFR 74 >=60 mL/min/BSA   PHOSPHORUS   Result Value Ref Range    PHOSPHORUS 2.7 2.3 - 4.0 mg/dL   MAGNESIUM   Result Value Ref Range    MAGNESIUM 1.8 1.8 - 2.6 mg/dL   TEG, RAPID GLOBAL WITH LYSIS (TRAUMA)   Result Value Ref Range    R (CK) 11.2 (H) 4.6 - 9.1 min    LYS30 (%) 0.0 0.0 - 2.6 %    MA (CRT RAPID) 63.2 52.0 - 70.0 mm    MA FIBRINOGEN (CFF) 18.4 15.0 - 32.0 mm   PT/INR   Result Value Ref Range    PROTHROMBIN TIME 13.7 9.1 - 13.9 seconds    INR 1.19 0.80 - 1.20   PTT (PARTIAL THROMBOPLASTIN TIME)   Result Value Ref Range    APTT 37.5 24.2 - 37.5 seconds   CBC WITH DIFF   Result Value Ref Range    WBC 6.3 3.7 - 11.0 x10^3/uL    RBC 5.12 3.85 - 5.22 x10^6/uL    HGB 13.0 11.5 - 16.0 g/dL    HCT 41.1 34.8 - 46.0 %    MCV 80.3 78.0 - 100.0 fL    MCH 25.4 (L) 26.0 - 32.0 pg    MCHC 31.6 31.0 - 35.5 g/dL  RDW-CV 17.4 (H) 11.5 - 15.5 %    PLATELETS 214 150 - 400 x10^3/uL    MPV 9.3 8.7 - 12.5  fL    NEUTROPHIL % 75 %    LYMPHOCYTE % 14 %    MONOCYTE % 9 %    EOSINOPHIL % 1 %    BASOPHIL % 1 %    NEUTROPHIL # 4.72 1.50 - 7.70 x10^3/uL    LYMPHOCYTE # 0.87 (L) 1.00 - 4.80 x10^3/uL    MONOCYTE # 0.58 0.20 - 1.10 x10^3/uL    EOSINOPHIL # <0.10 <=0.50 x10^3/uL    BASOPHIL # <0.10 <=0.20 x10^3/uL    IMMATURE GRANULOCYTE % 0 0 - 1 %    IMMATURE GRANULOCYTE # <0.10 <0.10 x10^3/uL   TROPONIN-I   Result Value Ref Range    TROPONIN I 13 0 - 30 ng/L   POC BLOOD GLUCOSE (RESULTS)   Result Value Ref Range    GLUCOSE, POC 96 70 - 105 mg/dl   ECG 12-LEAD   Result Value Ref Range    Ventricular rate 86 BPM    Atrial Rate 86 BPM    PR Interval 150 ms    QRS Duration 78 ms    QT Interval 376 ms    QTC Calculation 449 ms    Calculated P Axis 46 degrees    Calculated R Axis 50 degrees    Calculated T Axis 93 degrees       ASSESSMENT/Plan:   Active Hospital Problems    Diagnosis   . Pineal tumor   . Morbid obesity with BMI of 45.0-49.9, adult (CMS HCC)   Kelsey [name] is a 70 y.o. 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 HD1    -- Plan for R frontal EVD   -- Attempted multiple times to obtained consent from patient's husband via phone with no response    -- Imaging:   -- MRI brain wwo: ORDERED   -- MRI cervical wwo: ORDERED   -- MRI thoracic wwo: ORDERED   -- MRI lumbar wwo: ORDERED   -- CT CAP w: ORDERED   -- CT brain wo (outside facility): on synapse, reviewed  -- Pain/spasm control: Tylenol PRN  -- Diet: NPO+mIVF  -- Bowel regimen, last BM  (pta)  -- Abx: NONE  -- Activity: ambulate as tolerated w/ assist  -- PT/OT: ordered   -- DVT ppx: SCDs/Venodynes  -- Consults:    None  -- Lines/Drains: none  -- Wound: none  -- Disposition: none      Current Comorbid Conditions - Neurosurgery H&P  Compression of the Brain:  yes  Coma -Not applicable  Loss of  Consciousness:  no  Cerebral Hemorrhage: Not applicable  Craniectomy:  no  Seizure-Not applicable   Respiratory Failure/Other-Not applicable  No  Qualitative platelet defects  Coagulopathy Coagulopathy due to Medication:  Eliquis  N/A  Fatigue/Debility: NA    DNR Status this admission:  Full Code  DVT/PE Prophylaxis: SCDs/ Venodynes/Impulse boots    Denisse D. Ezzard Standing, MD  PGY-1  Neurosurgery  01/08/2021,22:25  Pager #[phone]

## 2021-01-08 NOTE — Nurses Notes (Signed)
Pt arrived to NCCU. Service at bedside. VS and assessment per flowhsheet.

## 2021-01-09 ENCOUNTER — Inpatient Hospital Stay (HOSPITAL_COMMUNITY): Payer: Medicare Other

## 2021-01-09 ENCOUNTER — Ambulatory Visit (HOSPITAL_COMMUNITY): Payer: Medicare Other

## 2021-01-09 ENCOUNTER — Telehealth (HOSPITAL_COMMUNITY): Payer: Self-pay | Admitting: Radiation Oncology

## 2021-01-09 ENCOUNTER — Inpatient Hospital Stay (HOSPITAL_COMMUNITY): Payer: Medicare Other | Admitting: Radiology

## 2021-01-09 DIAGNOSIS — G9389 Other specified disorders of brain: Secondary | ICD-10-CM

## 2021-01-09 DIAGNOSIS — M5137 Other intervertebral disc degeneration, lumbosacral region: Secondary | ICD-10-CM

## 2021-01-09 DIAGNOSIS — C3431 Malignant neoplasm of lower lobe, right bronchus or lung: Secondary | ICD-10-CM

## 2021-01-09 DIAGNOSIS — M4802 Spinal stenosis, cervical region: Secondary | ICD-10-CM

## 2021-01-09 DIAGNOSIS — M5136 Other intervertebral disc degeneration, lumbar region: Secondary | ICD-10-CM

## 2021-01-09 DIAGNOSIS — G919 Hydrocephalus, unspecified: Secondary | ICD-10-CM

## 2021-01-09 DIAGNOSIS — Z923 Personal history of irradiation: Secondary | ICD-10-CM

## 2021-01-09 DIAGNOSIS — M50222 Other cervical disc displacement at C5-C6 level: Secondary | ICD-10-CM

## 2021-01-09 DIAGNOSIS — Z95828 Presence of other vascular implants and grafts: Secondary | ICD-10-CM

## 2021-01-09 DIAGNOSIS — E278 Other specified disorders of adrenal gland: Secondary | ICD-10-CM

## 2021-01-09 DIAGNOSIS — R918 Other nonspecific abnormal finding of lung field: Secondary | ICD-10-CM

## 2021-01-09 DIAGNOSIS — M4316 Spondylolisthesis, lumbar region: Secondary | ICD-10-CM

## 2021-01-09 DIAGNOSIS — Z982 Presence of cerebrospinal fluid drainage device: Secondary | ICD-10-CM

## 2021-01-09 DIAGNOSIS — Z9889 Other specified postprocedural states: Secondary | ICD-10-CM

## 2021-01-09 DIAGNOSIS — D496 Neoplasm of unspecified behavior of brain: Principal | ICD-10-CM

## 2021-01-09 DIAGNOSIS — J90 Pleural effusion, not elsewhere classified: Secondary | ICD-10-CM

## 2021-01-09 DIAGNOSIS — I517 Cardiomegaly: Secondary | ICD-10-CM

## 2021-01-09 DIAGNOSIS — R9082 White matter disease, unspecified: Secondary | ICD-10-CM

## 2021-01-09 DIAGNOSIS — M50322 Other cervical disc degeneration at C5-C6 level: Secondary | ICD-10-CM

## 2021-01-09 DIAGNOSIS — M5126 Other intervertebral disc displacement, lumbar region: Secondary | ICD-10-CM

## 2021-01-09 DIAGNOSIS — R911 Solitary pulmonary nodule: Secondary | ICD-10-CM

## 2021-01-09 DIAGNOSIS — M48061 Spinal stenosis, lumbar region without neurogenic claudication: Secondary | ICD-10-CM

## 2021-01-09 LAB — BODY FLUID CSF MAN DIFF
LYMPHOCYTE %: 7 %
MONOCYTE/MACROPHAGE %: 3 %
NEUTROPHIL %: 90 %

## 2021-01-09 LAB — PRODUCT: FFP/PLASMA - UNITS

## 2021-01-09 LAB — BASIC METABOLIC PANEL
ANION GAP: 9 mmol/L (ref 4–13)
BUN/CREA RATIO: 17 (ref 6–22)
BUN: 12 mg/dL (ref 8–25)
CALCIUM: 8 mg/dL — ABNORMAL LOW (ref 8.8–10.2)
CHLORIDE: 112 mmol/L — ABNORMAL HIGH (ref 96–111)
CO2 TOTAL: 22 mmol/L — ABNORMAL LOW (ref 23–31)
CREATININE: 0.69 mg/dL (ref 0.60–1.05)
ESTIMATED GFR: 89 mL/min/BSA (ref >=60)
GLUCOSE: 146 mg/dL — ABNORMAL HIGH (ref 65–125)
POTASSIUM: 3.5 mmol/L (ref 3.5–5.1)
SODIUM: 143 mmol/L (ref 136–145)

## 2021-01-09 LAB — URINALYSIS, MACROSCOPIC
BILIRUBIN: NEGATIVE mg/dL
BLOOD: NEGATIVE mg/dL
COLOR: NORMAL
GLUCOSE: NEGATIVE mg/dL
KETONES: NEGATIVE mg/dL
LEUKOCYTES: NEGATIVE WBCs/uL
NITRITE: NEGATIVE
PH: 6 (ref 5.0–8.0)
PROTEIN: NEGATIVE mg/dL
SPECIFIC GRAVITY: <1.005 — ABNORMAL LOW (ref 1.005–1.030)
UROBILINOGEN: NEGATIVE mg/dL

## 2021-01-09 LAB — URINALYSIS, MICROSCOPIC
RBCS: 0 /hpf (ref <6.0)
WBCS: <1 /hpf (ref <11.0)

## 2021-01-09 LAB — MAGNESIUM: MAGNESIUM: 1.7 mg/dL — ABNORMAL LOW (ref 1.8–2.6)

## 2021-01-09 LAB — CBC
HCT: 35.8 % (ref 34.8–46.0)
HGB: 11.3 g/dL — ABNORMAL LOW (ref 11.5–16.0)
MCH: 25.2 pg — ABNORMAL LOW (ref 26.0–32.0)
MCHC: 31.6 g/dL (ref 31.0–35.5)
MCV: 79.9 fL (ref 78.0–100.0)
MPV: 9.5 fL (ref 8.7–12.5)
PLATELETS: 139 10*3/uL — ABNORMAL LOW (ref 150–400)
RBC: 4.48 10*6/uL (ref 3.85–5.22)
RDW-CV: 17.4 % — ABNORMAL HIGH (ref 11.5–15.5)
WBC: 3.5 10*3/uL — ABNORMAL LOW (ref 3.7–11.0)

## 2021-01-09 LAB — GLUCOSE CSF: GLUCOSE CSF: 88 mg/dL — ABNORMAL HIGH (ref 40–75)

## 2021-01-09 LAB — BODY FLUID CELL COUNT WITH DIFFERENTIAL
NUCLEATED CELLS, FLUID: 53 /uL (ref 0–5)
RBC COUNT: 1300 /uL

## 2021-01-09 LAB — PHOSPHORUS: PHOSPHORUS: 2.8 mg/dL (ref 2.3–4.0)

## 2021-01-09 LAB — PRODUCT: PROTHROMBIN COMPLEX CONCENTRATE (PCC, KCENTRA)

## 2021-01-09 LAB — PROTEIN CSF: PROTEIN CSF: 17 mg/dL (ref 15–45)

## 2021-01-09 MED ORDER — LOPERAMIDE 2 MG CAPSULE
2.0000 mg | ORAL_CAPSULE | ORAL | Status: DC | PRN
Start: 2021-01-09 — End: 2021-01-12

## 2021-01-09 MED ORDER — LORATADINE 10 MG TABLET
10.0000 mg | ORAL_TABLET | Freq: Every day | ORAL | Status: DC
Start: 2021-01-09 — End: 2021-01-12
  Administered 2021-01-09 – 2021-01-12 (×4): 10 mg via ORAL
  Filled 2021-01-09 (×4): qty 1

## 2021-01-09 MED ORDER — CARVEDILOL 3.125 MG TABLET
3.1250 mg | ORAL_TABLET | Freq: Two times a day (BID) | ORAL | Status: DC
Start: 2021-01-09 — End: 2021-01-09
  Administered 2021-01-09: 3.125 mg via GASTROSTOMY
  Filled 2021-01-09: qty 1

## 2021-01-09 MED ORDER — MAGNESIUM OXIDE 400 MG (241.3 MG MAGNESIUM) TABLET
400.0000 mg | ORAL_TABLET | Freq: Every day | ORAL | Status: DC
Start: 2021-01-09 — End: 2021-01-12
  Administered 2021-01-09 – 2021-01-12 (×4): 400 mg via ORAL
  Filled 2021-01-09 (×4): qty 1

## 2021-01-09 MED ORDER — IOPAMIDOL 370 MG IODINE/ML (76 %) INTRAVENOUS SOLUTION
100.0000 mL | INTRAVENOUS | Status: AC
Start: 2021-01-09 — End: 2021-01-09
  Administered 2021-01-09: 11:00:00 100 mL via INTRAVENOUS

## 2021-01-09 MED ORDER — METOPROLOL TARTRATE 5 MG/5 ML INTRAVENOUS SOLUTION
5.0000 mg | INTRAVENOUS | Status: AC
Start: 2021-01-10 — End: 2021-01-09

## 2021-01-09 MED ORDER — LORAZEPAM 2 MG/ML INJECTION SOLUTION
2.0000 mg | Freq: Once | INTRAMUSCULAR | Status: AC
Start: 2021-01-09 — End: 2021-01-09
  Administered 2021-01-09: 2 mg via INTRAVENOUS

## 2021-01-09 MED ORDER — BUDESONIDE 0.5 MG/2 ML SUSPENSION FOR NEBULIZATION
0.5000 mg | INHALATION_SUSPENSION | Freq: Two times a day (BID) | RESPIRATORY_TRACT | Status: DC
Start: 2021-01-09 — End: 2021-01-12
  Administered 2021-01-09 – 2021-01-12 (×7): 0.5 mg via RESPIRATORY_TRACT
  Filled 2021-01-09 (×5): qty 2

## 2021-01-09 MED ORDER — GADOBUTROL 1 MMOL/ML (604.72 MG/ML) INTRAVENOUS SOLUTION
13.0000 mL | INTRAVENOUS | Status: AC
Start: 2021-01-09 — End: 2021-01-09
  Administered 2021-01-09: 04:00:00 13 mL via INTRAVENOUS

## 2021-01-09 MED ORDER — HALOPERIDOL LACTATE 5 MG/ML INJECTION SOLUTION
5.0000 mg | INTRAMUSCULAR | Status: AC
Start: 2021-01-09 — End: 2021-01-09

## 2021-01-09 MED ORDER — HALOPERIDOL LACTATE 5 MG/ML INJECTION SOLUTION
INTRAMUSCULAR | Status: AC
Start: 2021-01-09 — End: 2021-01-09
  Administered 2021-01-09: 5 mg via INTRAVENOUS
  Filled 2021-01-09: qty 1

## 2021-01-09 MED ORDER — FENTANYL (PF) 50 MCG/ML INJECTION SOLUTION
Freq: Once | INTRAMUSCULAR | Status: AC | PRN
Start: 2021-01-09 — End: 2021-01-09
  Administered 2021-01-09: 50 ug via INTRAVENOUS
  Administered 2021-01-09 (×2): 25 ug via INTRAVENOUS

## 2021-01-09 MED ORDER — LORAZEPAM 2 MG/ML INJECTION SOLUTION
INTRAMUSCULAR | Status: AC
Start: 2021-01-09 — End: 2021-01-09
  Filled 2021-01-09: qty 1

## 2021-01-09 MED ORDER — ATORVASTATIN 10 MG TABLET
10.0000 mg | ORAL_TABLET | Freq: Every evening | ORAL | Status: DC
Start: 2021-01-09 — End: 2021-01-12
  Administered 2021-01-09 – 2021-01-11 (×3): 10 mg via ORAL
  Filled 2021-01-09 (×3): qty 1

## 2021-01-09 MED ORDER — ARFORMOTEROL 15 MCG/2 ML SOLUTION FOR NEBULIZATION
15.0000 ug | INHALATION_SOLUTION | Freq: Two times a day (BID) | RESPIRATORY_TRACT | Status: DC
Start: 2021-01-09 — End: 2021-01-12
  Administered 2021-01-09 – 2021-01-12 (×7): 15 ug via RESPIRATORY_TRACT
  Filled 2021-01-09 (×6): qty 1

## 2021-01-09 MED ORDER — DIPHENHYDRAMINE 50 MG/ML INJECTION SOLUTION
INTRAMUSCULAR | Status: AC
Start: 2021-01-09 — End: 2021-01-09
  Filled 2021-01-09: qty 1

## 2021-01-09 MED ORDER — DEXTROSE 5 % IN WATER (D5W) INTRAVENOUS SOLUTION
3.0000 g | Freq: Once | INTRAVENOUS | Status: AC
Start: 2021-01-10 — End: 2021-01-10
  Administered 2021-01-10 (×2): 3 g via INTRAVENOUS

## 2021-01-09 MED ORDER — FLECAINIDE 100 MG TABLET
100.0000 mg | ORAL_TABLET | Freq: Two times a day (BID) | ORAL | Status: DC
Start: 2021-01-09 — End: 2021-01-12
  Administered 2021-01-09 – 2021-01-12 (×6): 100 mg via ORAL
  Filled 2021-01-09 (×7): qty 1

## 2021-01-09 MED ORDER — DIPHENHYDRAMINE 50 MG/ML INJECTION SOLUTION
50.0000 mg | Freq: Once | INTRAMUSCULAR | Status: AC
Start: 2021-01-09 — End: 2021-01-09
  Administered 2021-01-09: 50 mg via INTRAVENOUS

## 2021-01-09 MED ORDER — METOPROLOL TARTRATE 5 MG/5 ML INTRAVENOUS SOLUTION
INTRAVENOUS | Status: AC
Start: 2021-01-09 — End: 2021-01-09
  Administered 2021-01-09: 5 mg via INTRAVENOUS
  Filled 2021-01-09: qty 5

## 2021-01-09 MED ORDER — POTASSIUM CHLORIDE ER 20 MEQ TABLET,EXTENDED RELEASE(PART/CRYST)
20.0000 meq | ORAL_TABLET | ORAL | Status: AC
Start: 2021-01-09 — End: 2021-01-09
  Administered 2021-01-09: 20 meq via ORAL
  Filled 2021-01-09: qty 1

## 2021-01-09 MED ORDER — ALBUTEROL SULFATE 2.5 MG/3 ML (0.083 %) SOLUTION FOR NEBULIZATION
2.5000 mg | INHALATION_SOLUTION | RESPIRATORY_TRACT | Status: DC | PRN
Start: 2021-01-09 — End: 2021-01-12

## 2021-01-09 MED ORDER — CARVEDILOL 3.125 MG TABLET
3.1250 mg | ORAL_TABLET | Freq: Two times a day (BID) | ORAL | Status: DC
Start: 2021-01-09 — End: 2021-01-10
  Administered 2021-01-09 – 2021-01-10 (×2): 3.125 mg via ORAL
  Filled 2021-01-09 (×2): qty 1

## 2021-01-09 MED ORDER — FERROUS SULFATE 300 MG (60 MG IRON)/5 ML ORAL LIQUID
60.0000 mg | ORAL | Status: DC
Start: 2021-01-09 — End: 2021-01-12
  Administered 2021-01-09 – 2021-01-11 (×2): 60 mg via ORAL
  Filled 2021-01-09 (×2): qty 5

## 2021-01-09 MED ORDER — SODIUM CHLORIDE 0.9 % INTRAVENOUS SOLUTION
INTRAVENOUS | Status: DC
Start: 2021-01-10 — End: 2021-01-10
  Administered 2021-01-10: 0 mL via INTRAVENOUS

## 2021-01-09 MED ORDER — METOPROLOL TARTRATE 5 MG/5 ML INTRAVENOUS SOLUTION
5.0000 mg | INTRAVENOUS | Status: DC | PRN
Start: 2021-01-09 — End: 2021-01-12
  Administered 2021-01-10 – 2021-01-11 (×2): 5 mg via INTRAVENOUS
  Filled 2021-01-09 (×3): qty 5

## 2021-01-09 MED ORDER — HALOPERIDOL LACTATE 5 MG/ML INJECTION SOLUTION
INTRAMUSCULAR | Status: AC
Start: 2021-01-09 — End: 2021-01-09
  Administered 2021-01-09: 2 mg via INTRAVENOUS
  Filled 2021-01-09: qty 1

## 2021-01-09 MED ORDER — LEVOTHYROXINE 112 MCG TABLET
112.0000 ug | ORAL_TABLET | Freq: Every morning | ORAL | Status: DC
Start: 2021-01-09 — End: 2021-01-12
  Administered 2021-01-09 – 2021-01-12 (×4): 112 ug via ORAL
  Filled 2021-01-09 (×4): qty 1

## 2021-01-09 MED ORDER — ROPINIROLE 0.25 MG TABLET
0.5000 mg | ORAL_TABLET | Freq: Every evening | ORAL | Status: DC
Start: 2021-01-09 — End: 2021-01-12
  Administered 2021-01-09 – 2021-01-11 (×3): 0.5 mg via ORAL
  Filled 2021-01-09 (×4): qty 2

## 2021-01-09 MED ORDER — HALOPERIDOL LACTATE 5 MG/ML INJECTION SOLUTION
2.0000 mg | INTRAMUSCULAR | Status: AC
Start: 2021-01-09 — End: 2021-01-09

## 2021-01-09 MED ORDER — FAMOTIDINE 20 MG TABLET
20.0000 mg | ORAL_TABLET | Freq: Two times a day (BID) | ORAL | Status: DC
Start: 2021-01-09 — End: 2021-01-12
  Administered 2021-01-09 – 2021-01-12 (×7): 20 mg via ORAL
  Filled 2021-01-09 (×7): qty 1

## 2021-01-09 MED ORDER — MIDAZOLAM (PF) 1 MG/ML INJECTION SOLUTION
Freq: Once | INTRAMUSCULAR | Status: AC | PRN
Start: 2021-01-09 — End: 2021-01-09
  Administered 2021-01-09 (×3): 1 mg via INTRAVENOUS

## 2021-01-09 NOTE — Pharmacy (Signed)
Pharmacy Medication Reconciliation    Patient Name: Kelsey [name], Kelsey [name]  Date of Service: 01/09/2021  Date of Admission: 01/08/2021  Date of Birth: 03-17-1951  Length of Stay:   1 day   Service: NEUROSURGERY 2 Young  General Risk Score: 4      Transitions of Care:  1. Would you like to utilize the [facility]?   Yes -  If no, preferred pharmacy:      Do you have a way to pay for your discharge prescription medications at the time of discharge?      Information was collected from:  Pharmacy, Caregiver - remotely, and Reviewed off site  [facility] [phone]  Viviana Trimble (husband) [phone]    Clarified Prior to Admission Medications:  Prior to Admission medications    Medication Sig Taking Resumed Y/N (RPh) Comments   albuterol sulfate (PROVENTIL OR VENTOLIN OR PROAIR) 90 mcg/actuation Inhalation HFA Aerosol Inhaler INHALE 1 TO 2 PUFFS BY MOUTH EVERY 6 HOURS AS NEEDED Yes    Patient uses as needed    albuterol sulfate (PROVENTIL) 2.5 mg /3 mL (0.083 %) Inhalation Solution for Nebulization 3 mL (2.5 mg total) by Nebulization route Every 6 hours Yes    Patient uses as needed; no pharmacy record found    apixaban (ELIQUIS) 5 mg Oral Tablet Take 1 Tab (5 mg total) by mouth Twice daily Yes       aspirin (ECOTRIN) 325 mg Oral Tablet, Delayed Release (E.C.) Take 325 mg by mouth Once a day Yes    Patient takes as needed    atorvastatin (LIPITOR) 10 mg Oral Tablet Take 10 mg by mouth Once a day Yes    Most recent pharmacy fill 03/24/2020 for 3 months    BREZTRI AEROSPHERE 160-9-4.8 mcg/actuation Inhalation HFA Aerosol Inhaler INHALE 2 PUFFS BY MOUTH TWICE DAILY Yes    Most recent pharmacy fill 06/02/2020   carvediloL (COREG) 3.125 mg Oral Tablet Take 3.125 mg by mouth Twice daily with food Yes    Most recent pharmacy fill 11/02/2020 for 1 month was Carvedilol 6.25 mg take 3 tabs twice a day   diphenhydrAMINE (BENADRYL) 50 mg Oral Capsule Take 1 capsule by mouth 1 hour prior to CT scan.        docusate sodium  (COLACE) 100 mg Oral Capsule Take 100 mg by mouth Three times a day as needed  Yes    Patient takes as needed    ferrous sulfate (FEOSOL) 325 mg (65 mg iron) Oral Tablet Take 1 Tablet (325 mg total) by mouth Once a day Yes       flecainide (TAMBOCOR) 100 mg Oral Tablet Take 1 Tab (100 mg total) by mouth Twice daily Yes       levothyroxine (SYNTHROID) 112 mcg Oral Tablet Take 1 Tablet (112 mcg total) by mouth Every morning Yes       linaCLOtide (LINZESS) 72 mcg Oral Capsule Take 72 mcg by mouth Every morning Yes       lisinopriL (PRINIVIL) 20 mg Oral Tablet Take 20 mg by mouth Once a day Yes    Most recent pharmacy fill was Lisinopril 10 mg    loperamide (IMODIUM) 2 mg Oral Capsule Take 2 mg by mouth Every 4 hours as needed Yes    Patient takes as needed    loratadine (CLARITIN) 10 mg Oral Tablet Take 1 Tablet (10 mg total) by mouth Once a day Yes    Patient takes as needed    magnesium  oxide (MAG-OX) 400 mg Oral Tablet Take 400 mg by mouth Once a day Yes       omeprazole (PRILOSEC) 20 mg Oral Capsule, Delayed Release(E.C.) Take 40 mg by mouth Once a day  Yes    Most recent pharmacy fill was Omeprazole 40 mg    oxyCODONE (ROXICODONE) 5 mg Oral Tablet Take 1 Tablet (5 mg total) by mouth Every 6 hours as needed for Pain Yes    Patient takes as needed;  PMP listed below   potassium chloride (K-DUR) 20 mEq Oral Tab Sust.Rel. Particle/Crystal Take 20 mEq by mouth Once a day Takes 3 days a week Yes    Husband not sure what days of the week patient takes; Most recent pharmacy fill was 20 mEq twice daily    prochlorperazine (COMPAZINE) 10 mg Oral Tablet Take 1 Tab (10 mg total) by mouth Four times a day as needed for Nausea/Vomiting Yes    Patient takes as needed; no pharmacy record found    rOPINIRole (REQUIP) 0.5 mg Oral Tablet Take 0.5 mg by mouth Every night Yes             Did patient's home medication list require updates? Yes    Changes made to Prior to Admission Med List:  -Medication Updated   -Aspirin 81 mg take  325 mg once a day to Aspirin 325 mg take 325 mg once a day      -Other    -Pharmacy also had record of Symbicort 160-4.5 inhale 2 puffs twice daily; husband stated a doctor told her to stop taking     Did pharmacist make suggestions for medication reconciliation? No    Allergies:  Documented     Dorene Grebe, Pharmacy Technician  Edie Vallandingham Winnifred Friar) Reynold Bowen, PharmD, [employer]

## 2021-01-09 NOTE — Procedures (Signed)
The procedure was done under my direction.  I was immediately available at all times.      Ronni Rumble, MD   Assistant Professor  [facility] Department of Neurosurgery  .     [facility]  Department of Neurosurgery  Procedure Note    Procedure Date:  01/09/2021  Time:  000  Procedure:  Georganna Skeans and Placement of Ventriculostomy Catheter   Diagnosis:  Brain tumor  Indication:  hydrocephalus  Duration: 28min  Wound Class: Clean  Description:  After informed consent from the patient's husband, the patient was positioned. A surgical time out was performed to confirm correct patient and procedure. Hair was clipped. The head was prepped and draped in the usual sterile fashion. The skin was prepped with duraprep and 1% lidocaine with epinephrine analgesia was used. Under satisfactory local anesthesia and sterile conditions, a 2 cm incision was made at [facility] point down to the galea and through the galea down to the skull.  A hand twist drill was used to create a trephine in the bone. The dura was pierced. The ventriculostomy catheter was placed to a depth of about 6 cm into the right frontal lateral ventricle. Initial pressure was 12. The CSF was clear. The tube was sutured and dressed in sterile fashion.  CT brain was ordered for confirmation of placement.  Patient tolerated procedure without complications.      Denisse D. Ezzard Standing, MD  PGY-1  Neurosurgery  01/09/2021,02:03  Pager #[phone]

## 2021-01-09 NOTE — Progress Notes (Signed)
INPATIENT NEUROCRITICAL PROGRESS NOTE      Kelsey [name] is a 70 y.o. 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    History of Present Illness: Per NSGY H&P:    Kelsey [name] is a 70 y.o., White female with PMH of A-fib and known malignancy (lung, cervical, colon) on Eliquis who presented to outside facility with memory changes. She underwent CT brain that showed a brain mass along the pineal region. Patient is unable to provide a detailed history, attempted to reach husband over the phone with no success. Reportedly, last took Eliquis today.     Date of Admission:  01/08/2021    Subjective:     5/23: Pt received Kcentra for Eliquis use. EVD placed. MRI and C,L,T spine complete.     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     Allergies:  Allergies   Allergen Reactions   . Shellfish Containing Products Shortness of Breath, Rash and Itching     scallops   . Vancomycin Shortness of Breath   . Barium Iodide    . Iodine And Iodide Containing Products      Itching, shortness of breath      Objective:     Most Recent Vital Signs:  Vital Signs Range Most Recent   Temperature Temp  Avg: 37.2 C (98.9 F)  Min: 36.8 C (98.2 F)  Max: 37.8 C (100 F) Temperature: 36.9 C (98.4 F)   Heart Rate Pulse  Avg: 92.1  Min: 83  Max: 106 Heart Rate: 87   Blood Pressure BP  Min: 97/76  Max: 164/78 BP (Non-Invasive): 124/68   Respiration Resp  Avg: 21.7  Min: 15  Max: 28 Respiratory Rate: (!) 21   SpO2 SpO2  Avg: 94.3 %  Min: 88 %  Max: 99 % SpO2: 94 %     Physical Exam:      Gen: No apparent distress     HENT:  Normocephalic, atraumatic.  Sclera anicteric.  No oropharyngeal lesions.     Ophthalmic:  Pupils equal, briskly reactive to light.   Fundoscopic exam technically difficult to obtain, red reflex present.     Neck:  Carotids without bruits.     CV: Regular rate and rhythm.  No murmurs.     Pulmonary: Normal work of breathing.  Clear to auscultation bilaterally without wheezes or rales.     Psych: Appropriate mood & affect.     Neurological Exam:      Mental Status Exam:     Level of Consciousness:  Alert and oriented to person, place, and time.      GCS - Eye:  2 - Eyes open to pain      GCS - Verbal:  4 - Confused      GCS - Motor:  6 - Following commands   Memory:  impaired   Attention:  Maintains attention when stimulated, but will lose attention if > 10 seconds pass.   Knowledge:  Impaired   Language:  Language expression and comprehension intact   Speech: No dysarthria     Cranial Nerves:    CN2:  Visual fields intact bilaterally   CN3/4/6:  Ocular movements aligned in all fields  of gaze without nystagmus.   CN5:  Facial sensation intact to soft and cool touch.   CN7:  Facial activation symmetric.   CN8:  Hearing intact to finger rub bilaterally.   CN9/10:  Palate elevates symmetrically.   CN11:  shoulder shrug symmetric   CN12:  Tongue protrudes midline.     Musculoskeletal:    Bulk/Tone/Tremor:  Normal muscle bulk and tone throughout. No tremor is appreciated.  Pronator Drift:  There is no pronator drift or satelliting.   Strength:   Right Arm:  Strength 5/5, generating full resistance in all muscle groups tested.   Left Arm:  Strength 5/5, generating full resistance in all muscle groups tested.   Right Leg:  Strength 5/5, generating full resistance in all muscle groups tested.   Left Leg:  Strength 5/5, generating full resistance in all muscle groups tested.     Sensation: Able to adequately distinguish soft and cool touch in all four extremities.      Reflexes: Reflexes globally 2/4 in biceps, triceps, patellar, and ankle; no focal asymmetry.  Toes downgoing bilaterally.     Coordination:  Unable to assess due to patient cooperation.      Gait:  Unable to assess due to intubation and/or patient cooperation     Pertinent Labs:  Complete Blood Count  Recent Labs     01/09/21  0547   WBC 3.5*   HGB 11.3*   PLTCNT 937*    Metabolic Panel  Recent Labs     01/09/21  0547   SODIUM 143   POTASSIUM 3.5   CHLORIDE 112*   CO2 22*   BUN 12   CREATININE 0.69   CALCIUM 8.0*   MAGNESIUM 1.7*   PHOSPHORUS 2.8      Liver Function Test  Recent Labs     01/08/21  2250   ALKPHOS 65    Miscellaneous Labs  Recent Labs     01/08/21  1932   TROPONINI 13         Pertinent Imaging Studies:  MRI CLT Spine: Pending read    MRI Brain w/wo Pending read    CT CAP: MPRESSION:    1. 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 Overall, this is unchanged in appearance from March 2022.  2. Unchanged 1.1 cm lingular, pleural-based pulmonary nodule as compared to March 2022.  3. Moderate loculated right pleural effusion.  4. Nodular thickening of the left adrenal gland is unchanged compared to multiple previous examinations.  5. Otherwise, no new suspicious masses or adenopathy are detected in the chest, abdomen, or pelvis to suggest new sites of metastatic disease.    CT Brain 5/23: R EVD in place, Ventricles slightly decreased in size.     CT Brain at outside facility 01/08/21:  Brain mass along the pineal region with associated hydrocephalus       Assessment:     Active Hospital Problems    Diagnosis   . 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    Plan:     Neurologic:     Pineal Tumor with associated Hydrocephalus s/p EVD: Patient with known malignancy of lung, cervical, colon    Neurological  exam notable for the following:  without focal deficits.  Neurological exam  unchanged..  . Monitoring:  o Continue q1h Neurochecks & Vitals, STAT Head CT for changes in exam  o Hydrocephalus: R EVD Placed, Leveled @ 10, ICP 1-14, Output 36  . Diagnostics:  o Neuro-imaging:  CT Brain on Synapse  o MRI Brain and Spine w/wo ordered per primary  o CT Brain post EVD placement   o CT CAP w/wo IV and PO contrast to assess for further malignancy  . Therapeutics:  o Continue Physical, Occupational, and Speech Therapy    Agitation   -Multiple doses of haldol and ativan overnight     Cardiovascular:     Status:  NSR, Hemodynamically stable. PMH of AFib on Eliquis  Vitals:  Heart Rate Pulse  Avg: 92.1  Min: 83  Max: 106 Heart Rate: 87   Blood Pressure BP  Min: 97/76  Max: 164/78 BP (Non-Invasive): 124/68   Cardiac Labs:  Lab Results   Component Value Date    TROPONINI 13 01/08/2021    BNP 106 (H) 12/01/2019      . Neuroscience blood pressure goals: Maintain Normotension- SBP < 160  o Use Hydralazine/Labetalol per parameters.  . Continue current antihypertensive regimen:  Lisinopril 20 mg QD (Holding)  . Hx of HLD - Continue atorvastatin 10    Afib, chronic   EKG: NSR, Troponin 13   Continue Home Flecainide 100 mg BID, Coreg 3.125   Hold home Eliquis for drain placement    Pulmonary:     Status:  adequate oxygenation/ventilation on current regimen.  Oxygen Requirement:       Most Recent Arterial Blood Gas:  Lab Results   Component Value Date    PH 7.37 12/10/2019    PCO2 41.00 12/10/2019    PO2 34.0 12/10/2019      . Continue Incentive Spirometry   . Maintain SpO2 > 90%  . CT CAP Pending  . Hx of asthma/COPD -   o Takes Breztri at home - Garlon Hatchet and budesonide here   o Albuterol PRN and Claritin   o Wears 2L at baseline    Gastrointestinal     Most recent LFTs:  Lab Results   Component Value Date    AST 27 11/07/2020    ALT 19 11/07/2020    ALKPHOS 65 01/08/2021    INR 1.19 01/08/2021      . Diet:  NPO  . GI PPx:  not indicated (patient not intubated, on steroids, or septic)  . Bowel Regimen:   Miralax 17g daily (hold for loose stools) and Senna 8.6mg  daily (hold for loose stools)   . 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  Continue to monitor I/Os.  Most recent Renal Labs:  Lab Results   Component Value Date    SODIUM 143 01/09/2021    POTASSIUM 3.5 01/09/2021    BUN 12 01/09/2021    CREATININE 0.69 01/09/2021    CALCIUM 8.0 (L) 01/09/2021    MAGNESIUM 1.7 (L) 01/09/2021    PHOSPHORUS 2.8 01/09/2021      . IV Fluids:  Normal Saline at 79mL/h  . Resupplement electrolytes prn  . Foley placed for urinary retention on 5/23     Hematology:     Most recent Hematology Labs:  Lab Results   Component Value Date    HGB 11.3 (L)  01/09/2021    MCV 79.9 01/09/2021    MCH 25.2 (L) 01/09/2021    PLTCNT 139 (L) 01/09/2021      . Hemoglobin:  Hemoglobin stable   . Platelet Count:  Platelets adequate.  Marland Kitchen DVT Prophylaxis:  SCDs, anticoagulation held due to drain placement. .   . Hold home Eliquis for EVD- Received Kcentra 5/22  . PT, PTT, INR, TEG ordered    Endocrine:       Most recent Endocrine Labs:  Lab Results   Component Value Date    GLUIP 96 01/08/2021    GLUIP 107 08/18/2020    GLUIP 107 12/01/2019    GLUIP 94 03/06/2019    GLUCOSENF 146 (H) 01/09/2021      . Neuroscience Glycemic Goals < 180  . Glycemic regimen:  No history of diabetes.  Continue Insulin Sliding Scale.   Marland Kitchen Hx of hypothyroidism - continue synthroid 112 mcg    Infectious Disease:     Status:  Afebrile, normal WBC, no evidence of infection.  Temperature Profile:  Temperature Temp  Avg: 37.2 C (98.9 F)  Min: 36.8 C (98.2 F)  Max: 37.8 C (100 F) Temperature: 36.9 C (98.4 F)     WBC Count  Lab Results   Component Value Date    WBC 3.5 (L) 01/09/2021    WBC 6.3 01/08/2021    WBC 8.2 11/07/2020    PMNS 75 01/08/2021    LYMPHO 14 01/08/2021      . Monitor qSOFA criteria    Other Systems:     Lines/Drains/Access:  Patient Lines/Drains/Airways Status     Active Line /  Dialysis Catheter / Dialysis Graft / Drain / Airway / Wound     Name Placement date Placement time Site Days    Peripheral IV Ultrasound guided;Extended dwell catheter Left Basilic  (medial side of arm) 01/08/21  2056  -- less than 1    Implantable Port Right Chest 09/25/18  1100  -- 836    EVD Right 01/09/21  0130  -- less than 1    Wound (Non-Surgical) Right Back 05/06/18  1335  -- 978    Wound (Non-Surgical) Coccyx 01/08/21  2030  -- less than 1                 Emergency Contact:  Extended Emergency Contact Information  Primary Emergency Contact: Hollern,HOWARD  Address: [place], [place] [address] [place] Phone: [phone]  Work Phone: [phone]  Mobile Phone: [phone]  Relation: Husband  Preferred language: English     Restraints:  None  Skin:  intact.  Activity:  Out of bed to chair  Disposition:  Remain in NSICU due to neuro checks overnight, EVD placement .      Code Status:  FULL CODE     Critical Care Attestation:  I was present at the bedside of this critically ill patient exclusive of procedures that are documented elsewhere.  My services were independent and non-duplicative of other practioners of other specialties (non-Neurocritical Care).    This patient suffers from failure or dysfunction of  Neurological system(s).     The care of this patient was in regard to managing (a) conditions(s) that has a high probability of sudden, clinically significant, or life-threatening deterioration and required a high degree of Provider attention and direct involvement to intervene urgently. Data review and care planning was performed in direct  proximity of the patient, examination was obviously performed in direct contact with the patient. All of this time was exclusive of procedure which will be documented elsewhere in the chart.   My critical care time involved full attention to the patient's condition and included:   Review of nursing notes and/or old  charts   Review of medications, allergies, and vital signs   Documentation time   Consultant collaboration on findings and treatment options   Care, transfer of care, and discharge plans   Ordering, interpreting, and reviewing diagnostic studies/ lab tests   Obtaining necessary history from family, EMS, nursing home staff and/or treating physicians     Total Critical Care Time: 35 minutes    Theodora Blow, APRN,NP-C  01/09/2021, 07:38         Attending Attestation:     Seymour   I have reviewed the resident/ midlevel's note.  I agree with their findings and/or have made additions/ edits as well as my findings below.    This is a 70 y.o. female with atrial fibrillation on Eliquis, HTN, HLD, Asthma, and cervical and colon cancer.  I am managing her for:   Pineal mass   Obstructive Hydrocephalus    1)  Neurological:  Neurological exam notable for the following lethargic but following commands bilaterally. Continue PT/OT/SLP.                - Pineal Mass:  NSGY following, awaiting recommendations on biopsy/resection.  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  EVD set at [facility].  Drained 85mL over past 24h.                 - RLS:  Continue Requip 0.5mg  qHS.  2)  Cardiovascular:  Neuroscience blood pressure goals 100-160.  Use labetalol/hydralazine per parameters.  Trop/EKG negative.                - Chronic Atrial Fibrillation:  HOLD Eliquis in preparation for surgery.  Continue Flecainide 100mg  bid, Coreg 3.125mg  BID.  3)  Pulmonary:  Oxygenation/Ventilation adequate on current regimen. Continue incentive spirometry.                - COPD:  2LNC at baseline.  Resume budesonide and Brovana.    4)  Gastrointestinal:  Nutrition with NPO until passes dysphagia screen. Continue bowel regimen.  5)  Renal:  No evidence of AKI. I/Os adequate.  Monitor electrolytes and resupplement prn. IV Fluids:  NS  45mL/h while NPO  6)  Hematological:  Hgb/Plts adequate. Continue DVT prophylaxis with SCDs, hold Lovenox x48h.  7)  Endocrine:  Continue insulin sliding scale to maintain glucose < 180.  Continue Synthroid 112mcg daily.  8)  Infectious Disease:  Afebrile, normal WBC, no evidence of infection.  9)  Lines/Access/Restraints:                 Lines:  EVD  Access:  Port, PIVs  Restraints:  none  Skin:  intact  10)  Disposition:  Remain in NSICU due to EVD.  11)  Code Status:  FULL CODE    I was present at the bedside of this critically ill patient for 35 minutes exclusive of procedures that are documented elsewhere.  My services were independent and non-duplicative of other practioners of other specialties (non-Neurocritical Care).  The care  of this patient was in regard to managing (a) conditions(s) that has a high probability of sudden, clinically significant, or life-threatening deterioration and required a high degree of Attending Physician attention and direct involvement to intervene urgently. Data review and care planning was performed in direct proximity of the patient, examination was obviously performed in direct contact with the patient. All of this time was exclusive of procedure which will be documented elsewhere in the chart.   My critical care time involved full attention to the patient's condition and included:   * Review of nursing notes and/or old charts   * Review of medications, allergies, and vital signs   * Documentation time   * Consultant collaboration on findings and treatment options   * Care, transfer of care, and discharge plans   * Ordering, interpreting, and reviewing diagnostic studies/ lab tests   * Obtaining necessary history from family, EMS, nursing home staff and/or treating physicians     My critical care time did not include time spent teaching resident physician(s) or other services of resident physicians, or performing other reported procedures.     Total Critical Care Time: 35  minutes    Dairl Ponder, MD  Neurocritical Care Attending  01/09/2021  14:36

## 2021-01-09 NOTE — Nurses Notes (Signed)
Pt can't hold still during MRI. Ativan, benadryl and haldol given.

## 2021-01-09 NOTE — Ancillary Notes (Signed)
[facility]  MRI Technologist Note        MRI has been completed.        William [facility], RT (R)(MR) 01/09/2021, 04:37

## 2021-01-09 NOTE — Anesthesia Preprocedure Evaluation (Addendum)
ANESTHESIA PRE-OP EVALUATION  Planned Procedure: VENTRICULOSTOMY ENDOSCOPIC WITH NAVIGATION (N/A )  CT SCAN (N/A )      Review of Systems         patient summary reviewed          Pulmonary   COPD,   Cardiovascular    Hypertension, atrial fibrillation (on eliquis) and hyperlipidemia ,       GI/Hepatic/Renal    GERD     Endo/Other    hypothyroidism and obesity,       Neuro/Psych/MS    GCS 4/6/4; Pineal Tumor with associated Hydrocephalus     Cancer  CA (lung, cervical, colon),   Findings suggestive of intracranial metastatic disease with 3 enhancing lesions within the brain parenchyma.                Physical Assessment      Airway     Comment: Unable to cooperate with airway exam        Mouth Opening: good.            Dental           (+) edentulous           Pulmonary    Breath sounds clear to auscultation       Cardiovascular    Rhythm: regular  Rate: Normal       Other findings            Plan  ASA 3     Planned anesthesia type: general endotracheal                     Intravenous induction     Anesthesia issues/risks discussed are: Dental Injuries, Nerve Injuries, PONV, Cardiac Events/MI, Blood Loss, Stroke, Post-op Pain Management, Sore Throat, Intraoperative Awareness/ Recall, Aspiration and Art Line Placement.  Anesthetic plan and risks discussed with spouse.      Use of blood products discussed with spouse who consented to blood products.                Plan discussed with CRNA.

## 2021-01-09 NOTE — Care Plan (Addendum)
4/6/4. EVD placed. CT done ICP 13.       Problem: Adult Inpatient Plan of Care  Goal: Plan of Care Review  Outcome: Ongoing (see interventions/notes)  Goal: Patient-Specific Goal (Individualized)  Outcome: Ongoing (see interventions/notes)  Goal: Absence of Hospital-Acquired Illness or Injury  Outcome: Ongoing (see interventions/notes)  Intervention: Identify and Manage Fall Risk  Recent Flowsheet Documentation  Taken 01/09/2021 0300 by Durene Fruits, RN  Safety Promotion/Fall Prevention: safety round/check completed  Taken 01/09/2021 0200 by Durene Fruits, RN  Safety Promotion/Fall Prevention: safety round/check completed  Taken 01/09/2021 0150 by Durene Fruits, RN  Safety Promotion/Fall Prevention: safety round/check completed  Taken 01/08/2021 2300 by Durene Fruits, RN  Safety Promotion/Fall Prevention: safety round/check completed  Taken 01/08/2021 2200 by Durene Fruits, RN  Safety Promotion/Fall Prevention: safety round/check completed  Taken 01/08/2021 2100 by Durene Fruits, RN  Safety Promotion/Fall Prevention: safety round/check completed  Taken 01/08/2021 2000 by Durene Fruits, RN  Safety Promotion/Fall Prevention: safety round/check completed  Taken 01/08/2021 1947 by Durene Fruits, RN  Safety Promotion/Fall Prevention:  . toileting scheduled  . safety round/check completed  Intervention: Prevent Skin Injury  Recent Flowsheet Documentation  Taken 01/09/2021 0300 by Durene Fruits, RN  Body Position: turned q 2 hours  Taken 01/08/2021 2200 by Durene Fruits, RN  Body Position: turned q 2 hours  Taken 01/08/2021 1947 by Durene Fruits, RN  Body Position: positioned/repositioned independently  Intervention: Prevent and Manage VTE (Venous Thromboembolism) Risk  Recent Flowsheet Documentation  Taken 01/08/2021 1947 by Durene Fruits, RN  VTE Prevention/Management:  . ambulation promoted  . sequential compression devices on  . dorsiflexion/plantar flexion performed  Goal: Optimal Comfort and  Wellbeing  Outcome: Ongoing (see interventions/notes)  Intervention: Provide Person-Centered Care  Recent Flowsheet Documentation  Taken 01/08/2021 1947 by Durene Fruits, RN  Trust Relationship/Rapport: care explained  Goal: Rounds/Family Conference  Outcome: Ongoing (see interventions/notes)     Patient Goals Based on Modifiable Risk Factors    The following modifiable risk factors were discussed with patient:     Unhealthy Diet    Goal:   Maintain a healthy balanced diet.    Patient's selected lifestyle modification(s):  Meet with a dietician for healthy food sources    Patient/Family/Caregiver response to plan:    Patient unableto participate d/t AMS.

## 2021-01-09 NOTE — Progress Notes (Incomplete)
[  NCCU 17/A] - Kelsey [name]  This is a 71 y.o. female with atrial fibrillation on Eliquis, HTN, HLD, Asthma, and cervical and colon cancer.  I am managing her for:  . Pineal mass  . Obstructive Hydrocephalus    1)  Neurological:  Neurological exam notable for the following alert, following commands bilaterally. Continue PT/OT/SLP.    - Pineal Mass:  NSGY following, awaiting recommendations on biopsy/resection.  CT-CAP with multiple masses in RLL lung, pleura (with loculated R-pleural effusion), L-adrenal gland.    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  EVD set at 10cm.  Drained 167mL over past 24h.     - RLS:  Continue Requip 0.5mg  qHS.  2)  Cardiovascular:  Neuroscience blood pressure goals 100-160.  Use labetalol/hydralazine per parameters.  Trop/EKG negative.    - Chronic Atrial Fibrillation:  HOLD Eliquis in preparation for surgery.  Continue Flecainide 100mg  bid, Coreg increased to 6.25mg  BID.  3)  Pulmonary:  Oxygenation/Ventilation adequate on current regimen. Continue incentive spirometry.    - COPD:  2LNC at baseline, on RA here.  Resume budesonide and Brovana.  Continue Claritin.  4)  Gastrointestinal:  Nutrition with NPO for OR. Continue bowel regimen.  5)  Renal:  No evidence of AKI. I/Os adequate.  Monitor electrolytes and resupplement prn. IV Fluids:  NS 23mL/h while NPO  6)  Hematological:  Hgb/Plts adequate. Continue DVT prophylaxis with SCDs, hold Lovenox x48h post OR (resume 5/26).  7)  Endocrine:  Continue insulin sliding scale to maintain glucose < 180.  Continue Synthroid 181mcg daily.  8)  Infectious Disease:  Afebrile, normal WBC, no evidence of infection.  9)  Lines/Access/Restraints:            Lines:  EVD  Access:  Port, PIVs  Restraints:  none  Skin:  intact  10)  Disposition:  Remain in NSICU due to OR today, frequency of neurochecks after.  11)  Code Status:  FULL CODE  12)  Patient/Family Discussion:  I have updated the *** on rounds  today.    Data:  Net IO Since Admission: 839 mL [01/09/21 0741]

## 2021-01-09 NOTE — Nurses Notes (Signed)
Restraint Discontinuation    The need for the restrain is no longer present and the patient's needs can be addressed using less restrictive alternatives.

## 2021-01-09 NOTE — Nurses Notes (Signed)
After MRI and all the medication pt is now a 1/4/1. NCCU aware and will give time to let it wear off. UO up but cannot measure accurately, asked about possibility for DI. Service will evaluate.

## 2021-01-09 NOTE — ACP (Advance Care Planning) (Signed)
Does the Patient have an Advance Directive?: Yes, Patient Does Have Advance Directive for Healthcare Treatment  Type of Advance Directive Completed: *[place] Surrogate  Copy of Advance Directives in Chart?: Yes, Copy on Chart.(Specify in [place] Directive)  Name of MPOA or Healthcare Surrogate: Terri Skains, husband  Phone Number of MPOA or Healthcare Surrogate: [phone]       HCS completed with patient's husband, Leiloni Smithers, being appointed.  Patient has 2 children: Ryan [phone]) and Nadara Mustard [phone]).  Her parents are deceased.  She has no living siblings.  CCC spoke with Jimmie Molly and left voicemail for [employer], [place].  Patient's husband Nadara Mustard appointed as [place].  No objections voiced  by Jimmie Molly.  HCS signed verbally by husband and witness by bedside RN.  HCS signed by service and copy placed on chart.  Copy to CM office to be scanned.

## 2021-01-09 NOTE — Nurses Notes (Signed)
Spoke with NCCU about pt HR 150-170s Afib. Orders to be placed.

## 2021-01-09 NOTE — Care Management Notes (Signed)
[facility] Management Initial Evaluation    Patient Name: Kelsey [name]  Date of Birth: 04-09-1951  Sex: female  Date/Time of Admission: 01/08/2021  7:17 PM  Room/Bed: 17/A  Payor: MEDICARE / Plan: MEDICARE PART A AND B / Product Type: Medicare /   Primary Care Providers:  [facility] (General)    Pharmacy Info:   Preferred [facility], [address]    [place] [address]    Phone: [phone] Fax: [phone]    Hours: Not open 24 hours    [facility]    [place] [address]    Phone: [phone] Fax: [phone]    Hours: 24/7          Emergency Contact Info:   Extended Emergency Contact Information  Primary Emergency Contact: Bisceglia,HOWARD  Address: [place], [place] [address] [address] [place] Phone: [phone]  Work Phone: [phone]  Mobile Phone: [phone]  Relation: Husband  Preferred language: English    History:   Kelsey [name] is a 70 y.o., female, admitted with pineal tumor.    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 No   Insurance Information/Type   Insurance type Medicare   Employment/Financial   Patient has Prescription Coverage?  Yes        Name of Insurance Coverage for Medications [place]   Financial Concerns none   Living Environment   Select an age group to open "lives with" row.  Adult   Lives With spouse   Living Arrangements mobile home   Able to Return to Prior Arrangements yes   Home Safety   Home Assessment: No Problems Identified   Home Accessibility no concerns    Care Management Plan   Discharge Planning Status initial meeting   Projected Discharge Date 01/13/21   CM will evaluate for rehabilitation potential yes   Discharge Needs Assessment   Equipment Currently Used at Home walker, front wheeled;other (see comments);commode;hospital bed  (electric scooter)   Equipment Needed After Discharge none   Discharge Facility/Level of Care Needs SNF vs Rehab   Transportation Available ambulance   Referral Information   Admission Type inpatient   Address Verified verified-no changes   Arrived From acute hospital, other   [facility] Other - See Comments  [facility])   ADVANCE DIRECTIVES   Does the Patient have an Advance Directive? Yes, Patient Does Have Advance Directive for Healthcare Treatment   Document the Substance of the Advance Directive (Required) HCS   Type of Advance Directive Completed 1   Copy of Advance Directives in Chart? 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  Patient is alert and slightly disoriented.  Patient lives with her  husband in a mobile home with ramp entrance.  Patient uses a FWW and electric scooter for ambulation.  She has a bedside commode and hospital bed.  Husband assists her with personal needs.  She has no HH.  She is current with her PCP.  She has prescription medication coverage through [facility].  She obtains medications from [facility] in [place].    HCS completed with patient's husband, Perina Salvaggio, being appointed.  Patient has 2 children: Kelsey [name] [phone]) and Kelsey [name] [id number]([phone]).  Her parents are deceased.  She has no living siblings.  [facility] spoke with Kelsey [name] and left voicemail for Kelsey [name], [place].  Patient's husband Kelsey [name] appointed as [place].  No objections voiced  by Kelsey [name].  HCS signed verbally by husband and witness by bedside RN.  HCS signed by  service and copy placed on chart.  Copy to CM office to be scanned.    Plan is to monitor patient, q 1 hour neuro checks and vitals, ventriculostomy with navigation tomorrow, medication management, PT/OT.      Discharge Plan:  Home vs acute rehab (not psych), Home vs home with Home Health  Discharge plans to be completed pending patient progress.    The patient will continue to be evaluated for developing discharge needs.     Case Manager: Claudius Sis, [place] COORDINATOR  Phone: [phone]

## 2021-01-09 NOTE — Nurses Notes (Signed)
Restraint Initiation    Patient assessed and found to have the following condition AMS        Least restrictive alternatives attempted: Increase patient observation, moved closer to the nurses station, bed-exit alarm/motion detector, call light accessible, concealed tubes/lines, decreased/removed stimulus, frequent orientation, involved patient in conversation and provided familiar/personal items.    Patient is exhibiting the following behaviors: uncooperative and agitated.    The reason for application of restraint: patient attempting to remove artificial airway, patient attempting to pull at IV lines, patient attempting to pull at invasive medical tubes and patient is attempting to get out of bed    The restraint was applied to facilitate medical/surgical treatment to ensure safety.    The patient will continue to be evaluated and assessments documented on the flowsheet to ensure that the patient is released from the restraint at the earliest possible time.

## 2021-01-10 ENCOUNTER — Encounter (HOSPITAL_COMMUNITY): Payer: Self-pay | Admitting: Neurological Surgery

## 2021-01-10 ENCOUNTER — Inpatient Hospital Stay (HOSPITAL_COMMUNITY): Payer: Medicare Other | Admitting: Student in an Organized Health Care Education/Training Program

## 2021-01-10 ENCOUNTER — Encounter (HOSPITAL_COMMUNITY)
Admission: AD | Disposition: A | Discharge status: Home Health | Payer: Self-pay | Source: Other Acute Inpatient Hospital | Attending: Anesthesiology

## 2021-01-10 ENCOUNTER — Inpatient Hospital Stay (HOSPITAL_COMMUNITY): Payer: Medicare Other | Admitting: Radiology

## 2021-01-10 DIAGNOSIS — I609 Nontraumatic subarachnoid hemorrhage, unspecified: Secondary | ICD-10-CM

## 2021-01-10 DIAGNOSIS — C7931 Secondary malignant neoplasm of brain: Secondary | ICD-10-CM

## 2021-01-10 DIAGNOSIS — Z9889 Other specified postprocedural states: Secondary | ICD-10-CM

## 2021-01-10 DIAGNOSIS — G911 Obstructive hydrocephalus: Secondary | ICD-10-CM

## 2021-01-10 DIAGNOSIS — J449 Chronic obstructive pulmonary disease, unspecified: Secondary | ICD-10-CM

## 2021-01-10 LAB — POC BLOOD GLUCOSE (RESULTS)
GLUCOSE, POC: 114 mg/dl — ABNORMAL HIGH (ref 70–105)
GLUCOSE, POC: 110 mg/dl — ABNORMAL HIGH (ref 70–105)

## 2021-01-10 LAB — MAGNESIUM: MAGNESIUM: 2.3 mg/dL (ref 1.8–2.6)

## 2021-01-10 LAB — BASIC METABOLIC PANEL
ANION GAP: 7 mmol/L (ref 4–13)
BUN/CREA RATIO: 21 (ref 6–22)
BUN: 14 mg/dL (ref 8–25)
CALCIUM: 8.6 mg/dL — ABNORMAL LOW (ref 8.8–10.2)
CHLORIDE: 110 mmol/L (ref 96–111)
CO2 TOTAL: 26 mmol/L (ref 23–31)
CREATININE: 0.67 mg/dL (ref 0.60–1.05)
ESTIMATED GFR: 90 mL/min/BSA (ref >=60)
GLUCOSE: 111 mg/dL (ref 65–125)
POTASSIUM: 3.4 mmol/L — ABNORMAL LOW (ref 3.5–5.1)
SODIUM: 143 mmol/L (ref 136–145)

## 2021-01-10 LAB — CBC
HCT: 37.4 % (ref 34.8–46.0)
HGB: 12.2 g/dL (ref 11.5–16.0)
MCH: 25.5 pg — ABNORMAL LOW (ref 26.0–32.0)
MCHC: 32.6 g/dL (ref 31.0–35.5)
MCV: 78.1 fL (ref 78.0–100.0)
MPV: 9.1 fL (ref 8.7–12.5)
PLATELETS: 226 10*3/uL (ref 150–400)
RBC: 4.79 10*6/uL (ref 3.85–5.22)
RDW-CV: 17.5 % — ABNORMAL HIGH (ref 11.5–15.5)
WBC: 8.3 10*3/uL (ref 3.7–11.0)

## 2021-01-10 LAB — PHOSPHORUS: PHOSPHORUS: 2.5 mg/dL (ref 2.3–4.0)

## 2021-01-10 SURGERY — VENTRICULOSTOMY ENDOSCOPIC WITH NAVIGATION
Anesthesia: General | Laterality: Right | Wound class: Clean Wound: Uninfected operative wounds in which no inflammation occurred

## 2021-01-10 MED ORDER — ROCURONIUM 10 MG/ML INTRAVENOUS SOLUTION
Freq: Once | INTRAVENOUS | Status: DC | PRN
Start: 2021-01-10 — End: 2021-01-10
  Administered 2021-01-10: 50 mg via INTRAVENOUS

## 2021-01-10 MED ORDER — ACETAMINOPHEN 1,000 MG/100 ML (10 MG/ML) INTRAVENOUS SOLUTION
Freq: Once | INTRAVENOUS | Status: DC | PRN
Start: 2021-01-10 — End: 2021-01-10
  Administered 2021-01-10: 650 mg via INTRAVENOUS

## 2021-01-10 MED ORDER — GELATIN MATRIX SEALANT (FLOSEAL) 5 ML KIT
PACK | Freq: Once | CUTANEOUS | Status: DC | PRN
Start: 2021-01-10 — End: 2021-01-11
  Administered 2021-01-10: 0 mL via TOPICAL

## 2021-01-10 MED ORDER — SODIUM CHLORIDE 0.9 % INTRAVENOUS SOLUTION
INTRAVENOUS | Status: DC | PRN
Start: 2021-01-10 — End: 2021-01-10

## 2021-01-10 MED ORDER — SODIUM CHLORIDE 0.9 % IRRIGATION SOLUTION
1000.0000 mL | Status: DC | PRN
Start: 2021-01-10 — End: 2021-01-11
  Administered 2021-01-10 (×2): 1000 mL

## 2021-01-10 MED ORDER — DEXMEDETOMIDINE 4 MCG/ML IV DILUTION
Freq: Once | INTRAMUSCULAR | Status: DC | PRN
Start: 2021-01-10 — End: 2021-01-10
  Administered 2021-01-10: 8 ug via INTRAVENOUS

## 2021-01-10 MED ORDER — SODIUM CHLORIDE 0.9 % INTRAVENOUS SOLUTION
INTRAVENOUS | Status: DC | PRN
Start: 2021-01-10 — End: 2021-01-10
  Administered 2021-01-10: 0 via INTRAVENOUS

## 2021-01-10 MED ORDER — LIDOCAINE 1 %-EPINEPHRINE 1:100,000 INJECTION SOLUTION
INTRAMUSCULAR | Status: AC
Start: 2021-01-10 — End: 2021-01-10
  Filled 2021-01-10: qty 20

## 2021-01-10 MED ORDER — SUGAMMADEX 100 MG/ML INTRAVENOUS SOLUTION
INTRAVENOUS | Status: AC
Start: 2021-01-10 — End: 2021-01-10
  Filled 2021-01-10: qty 2

## 2021-01-10 MED ORDER — POTASSIUM BICARBONATE-CITRIC ACID 20 MEQ EFFERVESCENT TABLET
20.0000 meq | EFFERVESCENT_TABLET | ORAL | Status: AC
Start: 2021-01-10 — End: 2021-01-10
  Administered 2021-01-10: 20 meq via ORAL
  Filled 2021-01-10: qty 1

## 2021-01-10 MED ORDER — FENTANYL (PF) 50 MCG/ML INJECTION SOLUTION
25.0000 ug | Freq: Once | INTRAMUSCULAR | Status: AC
Start: 2021-01-10 — End: 2021-01-10

## 2021-01-10 MED ORDER — LIDOCAINE 1 %-EPINEPHRINE 1:100,000 INJECTION SOLUTION
15.0000 mL | Freq: Once | INTRAMUSCULAR | Status: DC | PRN
Start: 2021-01-10 — End: 2021-01-11

## 2021-01-10 MED ORDER — SURGIFOAM SIZE 100 CM SPONGE
1.0000 | VAGINAL_SPONGE | Freq: Once | CUTANEOUS | Status: DC | PRN
Start: 2021-01-10 — End: 2021-01-11
  Administered 2021-01-10: 1 via TOPICAL

## 2021-01-10 MED ORDER — HALOPERIDOL LACTATE 5 MG/ML INJECTION SOLUTION
3.0000 mg | INTRAMUSCULAR | Status: AC
Start: 2021-01-10 — End: 2021-01-10
  Administered 2021-01-10: 3 mg via INTRAVENOUS
  Filled 2021-01-10: qty 1

## 2021-01-10 MED ORDER — ALTEPLASE 1 MG/2 ML SYRINGE
1.0000 mg | INJECTION | Freq: Once | INTRAMUSCULAR | Status: DC
Start: 2021-01-10 — End: 2021-01-10
  Administered 2021-01-10: 0 mg
  Filled 2021-01-10: qty 2

## 2021-01-10 MED ORDER — PHENYLEPHRINE 1 MG/10 ML (100 MCG/ML) IN 0.9 % SOD.CHLORIDE IV SYRINGE
INJECTION | Freq: Once | INTRAVENOUS | Status: DC | PRN
Start: 2021-01-10 — End: 2021-01-10
  Administered 2021-01-10: 100 ug via INTRAVENOUS

## 2021-01-10 MED ORDER — ONDANSETRON HCL (PF) 4 MG/2 ML INJECTION SOLUTION
INTRAMUSCULAR | Status: AC
Start: 2021-01-10 — End: 2021-01-10
  Filled 2021-01-10: qty 2

## 2021-01-10 MED ORDER — DILTIAZEM 5 MG/ML INTRAVENOUS SOLUTION
0.2500 mg/kg | Freq: Once | INTRAVENOUS | Status: AC
Start: 2021-01-10 — End: 2021-01-10
  Administered 2021-01-10: 21 mg via INTRAVENOUS
  Filled 2021-01-10: qty 5

## 2021-01-10 MED ORDER — PHENYLEPHRINE 1 MG/10 ML (100 MCG/ML) IN 0.9 % SOD.CHLORIDE IV SYRINGE
INJECTION | INTRAVENOUS | Status: AC
Start: 2021-01-10 — End: 2021-01-10
  Filled 2021-01-10: qty 10

## 2021-01-10 MED ORDER — OXYCODONE 5 MG TABLET
5.0000 mg | ORAL_TABLET | ORAL | Status: DC | PRN
Start: 2021-01-10 — End: 2021-01-10

## 2021-01-10 MED ORDER — FENTANYL (PF) 50 MCG/ML INJECTION SOLUTION
INTRAMUSCULAR | Status: AC
Start: 2021-01-10 — End: 2021-01-10
  Filled 2021-01-10: qty 2

## 2021-01-10 MED ORDER — CARVEDILOL 3.125 MG TABLET
3.1250 mg | ORAL_TABLET | ORAL | Status: AC
Start: 2021-01-10 — End: 2021-01-10
  Administered 2021-01-10: 3.125 mg via ORAL
  Filled 2021-01-10: qty 1

## 2021-01-10 MED ORDER — BACITRACIN 500 UNIT/G OINTMENT TUBE
TOPICAL_OINTMENT | CUTANEOUS | Status: AC
Start: 2021-01-10 — End: 2021-01-10
  Filled 2021-01-10: qty 28

## 2021-01-10 MED ORDER — SUGAMMADEX 100 MG/ML INTRAVENOUS SOLUTION
Freq: Once | INTRAVENOUS | Status: DC | PRN
Start: 2021-01-10 — End: 2021-01-10
  Administered 2021-01-10: 200 mg via INTRAVENOUS

## 2021-01-10 MED ORDER — LIDOCAINE (PF) 20 MG/ML (2 %) INJECTION SOLUTION
INTRAMUSCULAR | Status: AC
Start: 2021-01-10 — End: 2021-01-10
  Filled 2021-01-10: qty 5

## 2021-01-10 MED ORDER — METOPROLOL TARTRATE 5 MG/5 ML INTRAVENOUS SOLUTION
5.0000 mg | INTRAVENOUS | Status: AC
Start: 2021-01-10 — End: 2021-01-10
  Administered 2021-01-10: 5 mg via INTRAVENOUS

## 2021-01-10 MED ORDER — OXYCODONE-ACETAMINOPHEN 5 MG-325 MG TABLET
1.0000 | ORAL_TABLET | ORAL | Status: DC | PRN
Start: 2021-01-10 — End: 2021-01-12

## 2021-01-10 MED ORDER — PROPOFOL 10 MG/ML IV BOLUS
INJECTION | Freq: Once | INTRAVENOUS | Status: DC | PRN
Start: 2021-01-10 — End: 2021-01-10
  Administered 2021-01-10: 120 mg via INTRAVENOUS

## 2021-01-10 MED ORDER — HALOPERIDOL LACTATE 5 MG/ML INJECTION SOLUTION
2.0000 mg | INTRAMUSCULAR | Status: DC
Start: 2021-01-10 — End: 2021-01-10

## 2021-01-10 MED ORDER — SURGIFOAM SIZE 100 CM SPONGE
VAGINAL_SPONGE | CUTANEOUS | Status: AC
Start: 2021-01-10 — End: 2021-01-10
  Filled 2021-01-10: qty 1

## 2021-01-10 MED ORDER — SODIUM CHLORIDE 0.9 % INTRAVENOUS SOLUTION
5.0000 mg/h | INTRAVENOUS | Status: DC
Start: 2021-01-10 — End: 2021-01-11
  Administered 2021-01-10: 0 mg/h via INTRAVENOUS
  Administered 2021-01-10 – 2021-01-11 (×2): 5 mg/h via INTRAVENOUS
  Administered 2021-01-11: 0 mg/h via INTRAVENOUS
  Administered 2021-01-11: 5 mg/h via INTRAVENOUS
  Filled 2021-01-10 (×2): qty 25

## 2021-01-10 MED ORDER — FENTANYL (PF) 50 MCG/ML INJECTION SOLUTION
Freq: Once | INTRAMUSCULAR | Status: DC | PRN
Start: 2021-01-10 — End: 2021-01-10
  Administered 2021-01-10 (×4): 50 ug via INTRAVENOUS

## 2021-01-10 MED ORDER — THROMBIN (RECOMBINANT) 5,000 UNIT TOPICAL SOLUTION
CUTANEOUS | Status: AC
Start: 2021-01-10 — End: 2021-01-10
  Filled 2021-01-10: qty 2

## 2021-01-10 MED ORDER — INSULIN LISPRO 100 UNIT/ML SUB-Q SSIP
0.0000 [IU] | INJECTION | SUBCUTANEOUS | Status: DC | PRN
Start: 2021-01-10 — End: 2021-01-10
  Filled 2021-01-10: qty 300

## 2021-01-10 MED ORDER — LIDOCAINE (PF) 100 MG/5 ML (2 %) INTRAVENOUS SYRINGE
INJECTION | Freq: Once | INTRAVENOUS | Status: DC | PRN
Start: 2021-01-10 — End: 2021-01-10
  Administered 2021-01-10: 100 mg via INTRAVENOUS

## 2021-01-10 MED ORDER — FENTANYL (PF) 50 MCG/ML INJECTION SOLUTION
INTRAMUSCULAR | Status: AC
Start: 2021-01-10 — End: 2021-01-10
  Administered 2021-01-10: 25 ug via INTRAVENOUS
  Filled 2021-01-10: qty 2

## 2021-01-10 MED ORDER — POTASSIUM CHLORIDE ER 20 MEQ TABLET,EXTENDED RELEASE(PART/CRYST)
40.0000 meq | ORAL_TABLET | ORAL | Status: AC
Start: 2021-01-10 — End: 2021-01-10
  Administered 2021-01-10: 40 meq via ORAL
  Filled 2021-01-10: qty 2

## 2021-01-10 MED ORDER — FENTANYL (PF) 50 MCG/ML INJECTION SOLUTION
INTRAMUSCULAR | Status: DC
Start: 2021-01-10 — End: 2021-01-10
  Filled 2021-01-10: qty 2

## 2021-01-10 MED ORDER — CARVEDILOL 6.25 MG TABLET
6.2500 mg | ORAL_TABLET | Freq: Two times a day (BID) | ORAL | Status: DC
Start: 2021-01-10 — End: 2021-01-12
  Administered 2021-01-10 – 2021-01-12 (×4): 6.25 mg via ORAL
  Filled 2021-01-10 (×4): qty 1

## 2021-01-10 MED ORDER — PROPOFOL 10 MG/ML INTRAVENOUS EMULSION
INTRAVENOUS | Status: AC
Start: 2021-01-10 — End: 2021-01-10
  Filled 2021-01-10: qty 20

## 2021-01-10 MED ORDER — THROMBIN (RECOMBINANT) 5,000 UNIT TOPICAL SOLUTION
Freq: Once | INTRAMUSCULAR | Status: DC | PRN
Start: 2021-01-10 — End: 2021-01-11

## 2021-01-10 MED ORDER — MORPHINE 2 MG/ML INTRAVENOUS SYRINGE
2.0000 mg | INJECTION | INTRAVENOUS | Status: DC | PRN
Start: 2021-01-10 — End: 2021-01-12

## 2021-01-10 MED ORDER — GELATIN MATRIX SEALANT (FLOSEAL) 10 ML KIT
PACK | CUTANEOUS | Status: AC
Start: 2021-01-10 — End: 2021-01-10
  Filled 2021-01-10: qty 1

## 2021-01-10 MED ORDER — OXYCODONE-ACETAMINOPHEN 5 MG-325 MG TABLET
2.0000 | ORAL_TABLET | ORAL | Status: DC | PRN
Start: 2021-01-10 — End: 2021-01-12
  Administered 2021-01-11 – 2021-01-12 (×4): 2 via ORAL
  Filled 2021-01-10 (×4): qty 2

## 2021-01-10 MED ORDER — CEFAZOLIN 10 GRAM SOLUTION FOR INJECTION
3.0000 g | Freq: Three times a day (TID) | INTRAMUSCULAR | Status: AC
Start: 2021-01-11 — End: 2021-01-11
  Administered 2021-01-11 (×3): 3 g via INTRAVENOUS
  Administered 2021-01-11 (×3): 0 g via INTRAVENOUS
  Filled 2021-01-10 (×3): qty 30

## 2021-01-10 SURGICAL SUPPLY — 72 items
APPL 70% ISPRP 2% CHG 26ML 13._2X13.2IN CHLRPRP PREP DEHP-FR (WOUND CARE/ENTEROSTOMAL SUPPLY) ×3
APPL 70% ISPRP 2% CHG 26ML CHLRPRP HI-LT ORNG PREP STRL LF  DISP CLR (WOUND CARE SUPPLY) ×6 IMPLANT
BANDAGE BLK2 3.6YDX3.4IN 6 PLY_ABS STRCH TIGHT FNSH EDGE (WOUND CARE SUPPLY) IMPLANT
BANDAGE BLK2 3.6YDX3.4IN 6 PLY_ABS STRCH TIGHT FNSH EDGE (WOUND CARE/ENTEROSTOMAL SUPPLY)
BANDAGE KERLIX 4.1YDX4.5IN 6 PLY ABS CRNKL WV LINT FREE COTTON LRG GAUZE STRL LF  DISP (WOUND CARE SUPPLY) IMPLANT
BANDAGE KERLIX 4.1YDX4.5IN 6 P_LY ABS SFT PCH LINT FR COT LRG (WOUND CARE/ENTEROSTOMAL SUPPLY)
BLANKET MISTRAL-AIR ADULT LWR BODY 55.9X40.2IN FRC AIR HI VOL BLWR INTUITIVE CONTROL PNL LRG LED (MISCELLANEOUS PT CARE ITEMS) ×2 IMPLANT
BLANKET WARMER 55.9INW X 40.2I_LOWER BODY MISTRAL-AIR (MISCELLANEOUS PT CARE ITEMS) ×1
CANNULA INJ 17GA NDLS SYRG BLUNT STRL LF  10ML BD INTRLNK PLASTIC BXTR INTLNK ABT LS MCGAW SAFELINE (IV TUBING & ACCESSORIES) ×2 IMPLANT
CATH EMB FOGARTY 2FR 4MM 60CM BAL REINF SMOOTH ATTACHMENT RND TIP ART SS SIL NATURAL RUB .05ML STRL (CARDIAC) ×2 IMPLANT
CATH EMB FOGARTY 3FR 5MM 80CM BAL REINF SMTH ATTACHMENT RND (CARDIAC)
CATH EMB FOGARTY 3FR 5MM 80CM RND BAL REINF SMOOTH ATTACHMENT TUBE PK ART SS SIL NATURAL RUB .1ML (CARDIAC) IMPLANT
CATH EXTERN DRAIN HRMTC 35CM 38CM LRG SET VENTRIC (Drains/Resovoirs) ×2 IMPLANT
CONV USE 23866 - NEEDLE HYPO 27GA 1.5IN STD MONOJECT SS POLYPROP REG BVL LL HUB UL SHRP ANTICORE YW STRL LF  DISP (NEEDLES & SYRINGE SUPPLIES) ×2 IMPLANT
CONV USE 320027 - CHILDRENS USE 320025 - KIT RM TURNOVER CSTM NONST LF (KITS & TRAYS (DISPOSABLE)) ×2
CONV USE 320027 - CHILDRENS USE 320025 - KIT RM TURNOVER CUSTOM NONST LF (KITS & TRAYS (DISPOSABLE)) ×2 IMPLANT
CONV USE 330323 - SYRINGE SFGLD 1.5IN 22GA 3ML LF  STRL HYPO  DTCH NEEDLE REG BVL VSB LOCK SHIELD MECH MED REG WL IM (NEEDLES & SYRINGE SUPPLIES) ×2 IMPLANT
CONV USE ITEM 156524 - ADHESIVE TISSUE EXOFIN 1.0ML_PREMIERPRO EXOFIN (SEALANTS) IMPLANT
CONV USE ITEM 338689 - PACK SURG CRANI STRL DISP LF (TRAY) ×2 IMPLANT
COVER CORD TRPNMR 24X8IN ADH DISP BRT ORNG (EQUIPMENT MINOR) ×2 IMPLANT
COVER CORD TRPNMR 24X8IN ADH D_ISP BRT ORNG (EQUIPMENT MINOR) ×1
COVER WAND RFD STRL 50EA/CS_01-0020 (EQUIPMENT MINOR) ×1
COVER WND RF DETECT STRL CLR EQP (EQUIPMENT MINOR) ×2 IMPLANT
DRAIN ACCUDRAIN ANRFLX V SYSTE_M CSF STRL LF DISP (Drains/Resovoirs)
DRAPE CRANI PCH HKLP LINE HLDR 135X74IN T PRXM LF  STRL DISP SURG POLYPROP SMS 24X20IN (PROTECTIVE PRODUCTS/GARMENTS) ×2 IMPLANT
DRAPE CRANI PCH HKLP LINE HLDR_135X74IN T PRXM LF STRL DISP (PROTECTIVE PRODUCTS/GARMENTS) ×1
DRAPE POUCH IRRIG 19X23IN 1016_10EA/BX (PROTECTIVE PRODUCTS/GARMENTS) ×3 IMPLANT
EXTERN DRAIN ACCUDRAIN CSF GRAD BRTT NDLS SAMPLE ST ANRFLX V SYSTEM STRL LF  DISP (Drains/Resovoirs) IMPLANT
GARMENT COMPRESS MED CALF CENT AURA NYL VASOGRAD LTWT BRTHBL (ORTHOPEDICS (NOT IMPLANTS)) ×2
GARMENT COMPRESS MED CALF CENTAURA NYL VASOGRAD LTWT BRTHBL SEQ FIL BLU 18- IN (ORTHOPEDICS (NOT IMPLANTS)) ×4 IMPLANT
GOWN SURG XL L3 NONREINFORCE H KLP CLSR SET IN SLEEVE STRL LF (DGOW) ×2
GOWN SURG XL L3 NONREINFORCE HKLP CLSR SET IN SLEEVE STRL LF  DISP BLU SIRUS SMS 47IN (DGOW) ×4 IMPLANT
KIT CRANI CDMN CRNL ACCESS DRU G (Drains/Resovoirs) ×1
KIT CRANI CDMN CRNL ACCESS DRU_G (Drains/Resovoirs) ×2 IMPLANT
KIT ROOM TURNOVER ~~LOC~~ CUSTOM (KITS & TRAYS (DISPOSABLE)) ×1
LABEL E-Z STICK_STLEZP1 100EA/CS (LABELS/CHART SUPPLIES) ×1
LABEL MED EZ PEEL MRKR LF (LABELS/CHART SUPPLIES) ×2 IMPLANT
MARKER SKIN PREP RST RLR LBL REG TIP STRL (OR) ×4 IMPLANT
MARKER SKIN PREP RST RLR LBL R_EG TIP STRL (OR) ×2
NEEDLE HYPO 27GA 1.5IN STD MO NOJECT SS POLYPROP REG BVL LL (NEEDLES & SYRINGE SUPPLIES) ×1
PACK NEURO LAD DYNJ49141B 5/CS (CUSTOM TRAYS & PACK) ×2 IMPLANT
PACK SURG CRANI STRL DISP LF (TRAY) ×2
PACK SURG TBG STRL DISP 30IN SIL LF (Connecting Tubes/Misc) ×4 IMPLANT
PERFORATOR 11-14MM CRANIAL DGR-O SURG STRL DISP PED (SURGICAL INSTRUMENTS) ×2 IMPLANT
PERFORATOR 14-11MM CRNL DGR-O_SURG STRL DISP PED (INSTRUMENTS) ×1
PIN ADULT MAYFIELD WNG GRV PLASTIC CRNM SKULL DISP STRL (TRAC) IMPLANT
SET ADMIN 85IN 15 GTT/ML 18ML IV SPNLK UNIV SPIKE RLLR CLAMP (IV Tubing & Accessories) ×2
SET ADMIN 85IN 15 GTT/ML 18ML IV SPNLK UNIV SPIKE RLLR CLAMP SLIDE DEHP-FR BPA PVC STRL LF (IV TUBING & ACCESSORIES) ×4 IMPLANT
SET CATH 3MM 1.6MM 35CM LRG SS_HRMTC TROCAR STRL LF DISP (Drains/Resovoirs) ×1
SET CORD TUBE FLY LEAD ROT PUMP CDMN IRRG NONST LF  DISP (Connecting Tubes/Misc) IMPLANT
SET CORD TUBE FLY LEAD ROT PUM_P CDMN IRRG NONST LF DISP (Connecting Tubes/Misc)
SOL ANFG DFGR ISOPRPNL PAD OVAL BTL NABRSV ADH STRL LF  DISP (KITS & TRAYS (DISPOSABLE)) ×2 IMPLANT
SOL IRRG 0.9% NACL 3L PLASTIC CONTAINR UROMATIC LF (SOLUTIONS) IMPLANT
SOLUTION ANTI FOG W/SPONGE 280101 DEFOG ENDOMATE 20EA/CS (KITS & TRAYS (DISPOSABLE)) ×1
SOLUTION IRRG NS 3000CC 2B7127_4/CS (SOLUTIONS)
STRAP POSITION KNEE FOAM SFT ADJ CNTCT CLSR LF (POSITIONING PRODUCTS) ×2 IMPLANT
STRAP POSITION KNEE FOAM SFT A_DJ CNTCT CLSR LF (POSITIONING PRODUCTS) ×2
SUTURE 3-0 SH VICRYL+ 18IN VIOL CR BRD 8 STRN ANBCTRL ABS (SUTURE/WOUND CLOSURE) IMPLANT
SYRINGE 1ML LF  STRL ST TB DISP (NEEDLES & SYRINGE SUPPLIES) ×2 IMPLANT
SYRINGE 1ML LF STRL ST TB DISP (NEEDLES & SYRINGE SUPPLIES) ×1
SYRINGE BD .5IN 27GA 1ML LF  STRL REG BVL DTCH NEEDLE ST TB REG WL DISP GRY (NEEDLES & SYRINGE SUPPLIES) ×2 IMPLANT
SYRINGE BD PRCSNGL .5IN 27GA 1_ML LF STRL DTCH NEEDLE ST TB (NEEDLES & SYRINGE SUPPLIES) ×1
SYRINGE FLUSH PSFLSH IV SAL PR_EFL PRESERVE FR 10ML STRL LF (NEEDLES & SYRINGE SUPPLIES) ×2
SYRINGE FLUSH PSFLSH LL IV NS PREFL PRSV FR 10ML STRL LF  DISP (NEEDLES & SYRINGE SUPPLIES) ×4 IMPLANT
SYRINGE LL 3ML LF  STRL GRAD N-PYRG DEHP-FR PVC FREE MED DISP CLR (NEEDLES & SYRINGE SUPPLIES) ×2 IMPLANT
SYRINGE SFGLD 1.5IN 22GA 3ML L F STRL REG BVL HYPO DTCH (NEEDLES & SYRINGE SUPPLIES) ×1
TRAY CATH 16FR LUBRISIL CMP CR 2 CONN ADV FOLEY SAF FLOW URMTR SILVER LF (Drains/Resovoirs) IMPLANT
TRAY CATH 16FR LUBRISIL CMP CR_2 CONN ADV FOLEY SAF FLOW (Drains/Resovoirs)
TRAY CRANIOTOMY CUSTOM_CS/1 (TRAY) ×1
TUBE INDIV SPECI STRL (TUB) ×2 IMPLANT
TUBING SILICONE 30IN (Connecting Tubes/Misc) ×2
TUBING SUCT CLR 20FT 9/32IN MEDIVAC NCDTV M/M CONN STRL LF (Suction) ×2 IMPLANT

## 2021-01-10 NOTE — Care Plan (Signed)
01/10/21 0936   Therapist Pager   SLP Pager [place] Session   Document Type other (see comments)  ((Received consult for swallow evaluation; pt NPO for OR today; evaluation deferred; our service will continue to follow and assess when medically appropriate).)     Phone:  [phone]

## 2021-01-10 NOTE — Care Plan (Signed)
4/6/5. OR planned. CT complete. EVD draining. ICP 3-7.    Problem: Adult Inpatient Plan of Care  Goal: Plan of Care Review  Outcome: Ongoing (see interventions/notes)  Goal: Patient-Specific Goal (Individualized)  Outcome: Ongoing (see interventions/notes)  Goal: Absence of Hospital-Acquired Illness or Injury  Outcome: Ongoing (see interventions/notes)  Intervention: Identify and Manage Fall Risk  Recent Flowsheet Documentation  Taken 01/09/2021 0300 by Durene Fruits, RN  Safety Promotion/Fall Prevention: safety round/check completed  Taken 01/09/2021 0200 by Durene Fruits, RN  Safety Promotion/Fall Prevention: safety round/check completed  Taken 01/09/2021 0150 by Durene Fruits, RN  Safety Promotion/Fall Prevention: safety round/check completed  Taken 01/08/2021 2300 by Durene Fruits, RN  Safety Promotion/Fall Prevention: safety round/check completed  Taken 01/08/2021 2200 by Durene Fruits, RN  Safety Promotion/Fall Prevention: safety round/check completed  Taken 01/08/2021 2100 by Durene Fruits, RN  Safety Promotion/Fall Prevention: safety round/check completed  Taken 01/08/2021 2000 by Durene Fruits, RN  Safety Promotion/Fall Prevention: safety round/check completed  Taken 01/08/2021 1947 by Durene Fruits, RN  Safety Promotion/Fall Prevention:  . toileting scheduled  . safety round/check completed  Intervention: Prevent Skin Injury  Recent Flowsheet Documentation  Taken 01/09/2021 0300 by Durene Fruits, RN  Body Position: turned q 2 hours  Taken 01/08/2021 2200 by Durene Fruits, RN  Body Position: turned q 2 hours  Taken 01/08/2021 1947 by Durene Fruits, RN  Body Position: positioned/repositioned independently  Intervention: Prevent and Manage VTE (Venous Thromboembolism) Risk  Recent Flowsheet Documentation  Taken 01/08/2021 1947 by Durene Fruits, RN  VTE Prevention/Management:  . ambulation promoted  . sequential compression devices on  . dorsiflexion/plantar flexion performed  Goal:  Optimal Comfort and Wellbeing  Outcome: Ongoing (see interventions/notes)  Intervention: Provide Person-Centered Care  Recent Flowsheet Documentation  Taken 01/08/2021 1947 by Durene Fruits, RN  Trust Relationship/Rapport: care explained  Goal: Rounds/Family Conference  Outcome: Ongoing (see interventions/notes)     Patient Goals Based on Modifiable Risk Factors    The following modifiable risk factors were discussed with patient:     Unhealthy Diet    Goal:   Maintain a healthy balanced diet.    Patient's selected lifestyle modification(s):  Meet with a dietician for healthy food sources    Patient/Family/Caregiver response to plan:    Patient agreed.

## 2021-01-10 NOTE — Progress Notes (Signed)
INPATIENT NEUROCRITICAL PROGRESS NOTE      Kelsey [name] is a 70 y.o. 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    History of Present Illness: Per NSGY H&P:    Kelsey [name] is a 70 y.o., White female with PMH of A-fib and known malignancy (lung, cervical, colon) on Eliquis who presented to outside facility with memory changes. She underwent CT brain that showed a brain mass along the pineal region. Patient is unable to provide a detailed history, attempted to reach husband over the phone with no success. Reportedly, last took Eliquis today.     Date of Admission:  01/08/2021    Subjective:   5/23: Pt received Kcentra for Eliquis use. EVD placed. MRI and C,L,T spine complete.   5/24: CT CAP completed. Plan for OR today for 3rd ventriculostomy.     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 albuterol sulfate (PROVENTIL OR VENTOLIN OR PROAIR) 90 mcg/actuation Inhalation HFA Aerosol Inhaler INHALE 1 TO 2 PUFFS BY MOUTH EVERY 6 HOURS AS NEEDED   . albuterol sulfate (PROVENTIL) 2.5 mg /3 mL (0.083 %) Inhalation Solution for Nebulization Take 2.5 mg by nebulization Every 6 hours as needed for Wheezing   . apixaban (ELIQUIS) 5 mg Oral Tablet Take 1 Tab (5 mg total) by mouth Twice daily   . aspirin (ECOTRIN) 325 mg Oral Tablet, Delayed Release (E.C.) Take 325 mg by mouth Once a day   . atorvastatin (LIPITOR) 10 mg Oral Tablet Take 10 mg by mouth Once a day   . BREZTRI AEROSPHERE 160-9-4.8 mcg/actuation Inhalation HFA Aerosol Inhaler INHALE 2 PUFFS BY MOUTH TWICE DAILY   . carvediloL (COREG) 3.125 mg Oral Tablet Take 3.125 mg by mouth Twice daily with food   . docusate sodium (COLACE) 100 mg  Oral Capsule Take 100 mg by mouth Three times a day as needed for Constipation   . ferrous sulfate (FEOSOL) 325 mg (65 mg iron) Oral Tablet Take 1 Tablet (325 mg total) by mouth Once a day   . flecainide (TAMBOCOR) 100 mg Oral Tablet Take 1 Tab (100 mg total) by mouth Twice daily   . levothyroxine (SYNTHROID) 112 mcg Oral Tablet Take 1 Tablet (112 mcg total) by mouth Every morning   . linaCLOtide (LINZESS) 72 mcg Oral Capsule Take 72 mcg by mouth Every morning   . lisinopriL (PRINIVIL) 20 mg Oral Tablet Take 20 mg by mouth Once a day   . loperamide (IMODIUM) 2 mg Oral Capsule Take 2 mg by mouth Every 4 hours as needed (Diarrhea)   . loratadine (CLARITIN) 10 mg Oral Tablet Take 1 Tablet (10 mg total) by mouth Once a day   . magnesium oxide (MAG-OX) 400 mg Oral Tablet Take 400 mg by mouth Once a day   . omeprazole (PRILOSEC) 20 mg Oral Capsule, Delayed Release(E.C.) Take 40 mg by mouth Once a day    . oxyCODONE (ROXICODONE) 5 mg Oral Tablet Take 1 Tablet (5 mg total) by mouth Every 6 hours as needed for Pain   . potassium chloride (K-DUR) 20 mEq Oral Tab Sust.Rel. Particle/Crystal Take 20 mEq by mouth Every Monday, Wednesday and Friday Takes 3  days a week   . prochlorperazine (COMPAZINE) 10 mg Oral Tablet Take 1 Tab (10 mg total) by mouth Four times a day as needed for Nausea/Vomiting   . rOPINIRole (REQUIP) 0.5 mg Oral Tablet Take 0.5 mg by mouth Every night     Allergies:  Allergies   Allergen Reactions   . Shellfish Containing Products Shortness of Breath, Rash and Itching     scallops   . Vancomycin Shortness of Breath   . Barium Iodide    . Iodine And Iodide Containing Products      Itching, shortness of breath      Objective:     Most Recent Vital Signs:  Vital Signs Range Most Recent   Temperature Temp  Avg: 36.8 C (98.3 F)  Min: 36.7 C (98.1 F)  Max: 37.1 C (98.8 F) Temperature: 36.7 C (98.1 F)   Heart Rate Pulse  Avg: 100.5  Min: 80  Max: 155 Heart Rate: (!) 153   Blood Pressure BP  Min: 133/61  Max:  171/72 BP (Non-Invasive): (!) 152/86   Respiration Resp  Avg: 20.7  Min: 12  Max: 28 Respiratory Rate: 12   SpO2 SpO2  Avg: 94.8 %  Min: 92 %  Max: 98 % SpO2: 95 %     Physical Exam:      Gen: No apparent distress     HENT:  Normocephalic, atraumatic.  Sclera anicteric.  No oropharyngeal lesions.     Ophthalmic:  Pupils equal, briskly reactive to light.  Fundoscopic exam technically difficult to obtain, red reflex present.     Neck:  Carotids without bruits.     CV: Regular rate and rhythm.  No murmurs.     Pulmonary: Normal work of breathing.  Clear to auscultation bilaterally without wheezes or rales.     Psych: Appropriate mood & affect.     Neurological Exam:      Mental Status Exam:     Level of Consciousness:  Alert and oriented to person, place, and time.      GCS - Eye:  4 - Spontaneous eye opening      GCS - Verbal:  4 - Confused      GCS - Motor:  6 - Following commands   Memory:  impaired   Attention:  Maintains attention when stimulated, but will lose attention if > 10 seconds pass.   Knowledge:  Impaired   Language:  Language expression and comprehension intact   Speech: No dysarthria     Cranial Nerves:    CN2:  Visual fields intact bilaterally   CN3/4/6:  Ocular movements aligned in all fields of gaze without nystagmus.   CN5:  Facial sensation intact to soft and cool touch.   CN7:  Facial activation symmetric.   CN8:  Hearing intact to finger rub bilaterally.   CN9/10:  Palate elevates symmetrically.   CN11:  shoulder shrug symmetric   CN12:  Tongue protrudes midline.     Musculoskeletal:    Bulk/Tone/Tremor:  Normal muscle bulk and tone throughout. No tremor is appreciated.  Pronator Drift:  There is no pronator drift or satelliting.   Strength:   Right Arm:  Strength 5/5, generating full resistance in all muscle groups tested.   Left Arm:  Strength 5/5, generating full resistance in all muscle groups tested.   Right Leg:  Strength 5/5, generating full resistance in all muscle groups tested.   Left  Leg:  Strength 5/5, generating full resistance in all muscle  groups tested.     Sensation: Able to adequately distinguish soft and cool touch in all four extremities.      Reflexes: Reflexes globally 2/4 in biceps, triceps, patellar, and ankle; no focal asymmetry.  Toes downgoing bilaterally.     Coordination:  Unable to assess due to patient cooperation.     Gait:  Unable to assess due to intubation and/or patient cooperation     Pertinent Labs:  Complete Blood Count  Recent Labs     01/10/21  0113   WBC 8.3   HGB 12.2   PLTCNT 237    Metabolic Panel  Recent Labs     01/10/21  0113   SODIUM 143   POTASSIUM 3.4*   CHLORIDE 110   CO2 26   BUN 14   CREATININE 0.67   CALCIUM 8.6*   MAGNESIUM 2.3   PHOSPHORUS 2.5      Liver Function Test  Recent Labs     01/08/21  2250   ALKPHOS 65    Miscellaneous Labs  Recent Labs     01/08/21  1932   TROPONINI 13         Pertinent Imaging Studies:  MRI CLT Spine: No evidence of metastatic disease     MRI Brain w/wo 5/23: IMPRESSION:  1. 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  2. The most prominent of these involves the midbrain tectum on the left with evidence of hemorrhage.This measures approximately 1.1 cm in diameter.  This results in increased ventricular prominence when compared to the study of 2019, but unchanged when compared to the study performed earlier on this date.   3. 2 additional lesions are located within the frontal lobes (7 mm on the right and 3.5 mm on the left).    CT CAP: MPRESSION:  1. 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 Overall, this is unchanged in appearance from March 2022.  2. Unchanged 1.1 cm lingular, pleural-based pulmonary nodule as compared to March 2022.  3. Moderate loculated right pleural effusion.  4. Nodular thickening of the left adrenal gland is  unchanged compared to multiple previous examinations.  5. Otherwise, no new suspicious masses or adenopathy are detected in the chest, abdomen, or pelvis to suggest new sites of metastatic disease.    CT Brain 5/23: R EVD in place, Ventricles slightly decreased in size.     CT Brain at outside facility 01/08/21:  Brain mass along the pineal region with associated hydrocephalus       Assessment:     Active Hospital Problems    Diagnosis   . 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    Plan:     Neurologic:     Pineal Tumor with associated Hydrocephalus s/p EVD: Patient with known malignancy of lung, cervical, colon    Neurological exam notable for the following:  without focal deficits.  Neurological exam unchanged..  . Monitoring:  o Continue q1h Neurochecks & Vitals, STAT Head CT for changes in exam  o Hydrocephalus: R EVD Placed, Leveled @ 10, ICP 1-9, Output 198  . Diagnostics:  o Neuro-imaging:  CT Brain  on Synapse  o MRI Brain and Spine w/wo ordered per primary  o CT Brain post EVD placement   o CT CAP w/wo IV and PO contrast to assess for further malignancy  . Therapeutics:  o Plan for OR for 3rd ventriculostomy   o Continue Physical, Occupational, and Speech Therapy    Agitation   -Multiple doses of haldol and ativan overnight 5/23    Cardiovascular:     Status:  NSR, Hemodynamically stable. PMH of AFib on Eliquis  Vitals:  Heart Rate Pulse  Avg: 100.5  Min: 80  Max: 155 Heart Rate: (!) 153   Blood Pressure BP  Min: 133/61  Max: 171/72 BP (Non-Invasive): (!) 152/86   Cardiac Labs:  Lab Results   Component Value Date    TROPONINI 13 01/08/2021    BNP 106 (H) 12/01/2019      . Neuroscience blood pressure goals: Maintain Normotension- SBP < 160  o Use Hydralazine/Labetalol per  parameters.  . Continue current antihypertensive regimen:  Lisinopril 20 mg QD (Holding)  . Hx of HLD - Continue atorvastatin 10    Afib, chronic   EKG: NSR, Troponin 13   Continue Home Flecainide 100 mg BID, Coreg 6.25   Cardizem bolus and gtt started on 5/24   Hold home Eliquis for drain placement   Discuss with NSGY when okay to restart     Pulmonary:     Status:  adequate oxygenation/ventilation on current regimen.  Oxygen Requirement:       Most Recent Arterial Blood Gas:  Lab Results   Component Value Date    PH 7.37 12/10/2019    PCO2 41.00 12/10/2019    PO2 34.0 12/10/2019      . Continue Incentive Spirometry   . Maintain SpO2 > 90%  . CT CAP Pending  . Hx of asthma/COPD -   o Takes Breztri at home - [place]on Hatchet and budesonide here   o Albuterol PRN and Claritin   o Wears 2L at baseline    Gastrointestinal     Most recent LFTs:  Lab Results   Component Value Date    AST 27 11/07/2020    ALT 19 11/07/2020    ALKPHOS 65 01/08/2021    INR 1.19 01/08/2021      . Diet:  NPO for OR  . GI PPx:  not indicated (patient not intubated, on steroids, or septic)  . Bowel Regimen:  Miralax 17g daily (hold for loose stools) and Senna 8.6mg  daily (hold for loose stools)   . 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  Continue to monitor I/Os.  Most recent Renal Labs:  Lab Results   Component Value Date    SODIUM 143 01/10/2021    POTASSIUM 3.4 (L) 01/10/2021    BUN 14 01/10/2021    CREATININE 0.67 01/10/2021    CALCIUM 8.6 (L) 01/10/2021    MAGNESIUM 2.3 01/10/2021    PHOSPHORUS 2.5 01/10/2021      . IV Fluids:  Normal Saline at 17mL/h  . Resupplement electrolytes prn  . Foley placed for urinary retention on 5/23     Hematology:     Most recent Hematology Labs:  Lab Results   Component Value Date    HGB 12.2 01/10/2021    MCV 78.1 01/10/2021    MCH 25.5 (L) 01/10/2021    PLTCNT 226 01/10/2021      .  Hemoglobin:  Hemoglobin stable   . Platelet Count:   Platelets adequate.  Marland Kitchen DVT Prophylaxis:  SCDs, hold anticoagulation for 48h post-operatively.   . Hold home Eliquis for EVD- Received Kcentra 5/22  . PT, PTT, INR, TEG ordered    Endocrine:       Most recent Endocrine Labs:  Lab Results   Component Value Date    GLUIP 96 01/08/2021    GLUIP 107 08/18/2020    GLUIP 107 12/01/2019    GLUIP 94 03/06/2019    GLUCOSENF 111 01/10/2021      . Neuroscience Glycemic Goals < 180  . Glycemic regimen:  No history of diabetes.  Continue Insulin Sliding Scale.   Marland Kitchen Hx of hypothyroidism - continue synthroid 112 mcg    Infectious Disease:     Status:  Afebrile, normal WBC, no evidence of infection.  Temperature Profile:  Temperature Temp  Avg: 36.8 C (98.3 F)  Min: 36.7 C (98.1 F)  Max: 37.1 C (98.8 F) Temperature: 36.7 C (98.1 F)     WBC Count  Lab Results   Component Value Date    WBC 8.3 01/10/2021    WBC 3.5 (L) 01/09/2021    WBC 6.3 01/08/2021    PMNS 90 01/09/2021    LYMPHO 14 01/08/2021      . Monitor qSOFA criteria    Other Systems:     Lines/Drains/Access:  Patient Lines/Drains/Airways Status     Active Line / Dialysis Catheter / Dialysis Graft / Drain / Airway / Wound     Name Placement date Placement time Site Days    Peripheral IV Ultrasound guided;Extended dwell catheter Left Basilic  (medial side of arm) 01/08/21  2056  -- 1    Implantable Port Right Chest 09/25/18  1100  -- 837    EVD Right 01/09/21  0130  -- 1    Foley Catheter 01/09/21  1000  -- less than 1    Wound (Non-Surgical) Right Back 05/06/18  1335  -- 979    Wound (Non-Surgical) Coccyx 01/08/21  2030  -- 1                 Emergency Contact:  Extended Emergency Contact Information  Primary Emergency Contact: Lusty,HOWARD  Address: [place], [place] [address] [place] Phone: [phone]  Work Phone: [phone]  Mobile Phone: [phone]  Relation: Husband  Preferred language: English     Restraints:  None  Skin:  intact.  Activity:  Out of bed to  chair  Disposition:  Remain in NSICU due to neuro checks overnight, EVD placement .      Code Status:  FULL CODE     Critical Care Attestation:  I was present at the bedside of this critically ill patient exclusive of procedures that are documented elsewhere.  My services were independent and non-duplicative of other practioners of other specialties (non-Neurocritical Care).    This patient suffers from failure or dysfunction of  Neurological system(s).     The care of this patient was in regard to managing (a) conditions(s) that has a high probability of sudden, clinically significant, or life-threatening deterioration and required a high degree of Provider attention and direct involvement to intervene urgently. Data review and care planning was performed in direct proximity of the patient, examination was obviously performed in direct contact with the patient. All of this time was exclusive of procedure  which will be documented elsewhere in the chart.   My critical care time involved full attention to the patient's condition and included:   Review of nursing notes and/or old charts   Review of medications, allergies, and vital signs   Documentation time   Consultant collaboration on findings and treatment options   Care, transfer of care, and discharge plans   Ordering, interpreting, and reviewing diagnostic studies/ lab tests   Obtaining necessary history from family, EMS, nursing home staff and/or treating physicians     Total Critical Care Time: 35 minutes    Theodora Blow, APRN,NP-C  01/10/2021, 07:10       Attending Attestation:     [place]   I have reviewed the resident/ midlevel's note.  I agree with their findings and/or have made additions/ edits as well as my findings below.    This is a 70 y.o. female with atrial fibrillation on Eliquis, HTN, HLD, Asthma, and cervical and colon cancer.  I am managing her for:   Pineal mass   Obstructive Hydrocephalus    1)  Neurological:  Neurological  exam notable for the following alert, following commands bilaterally. Continue PT/OT/SLP.                - Pineal Mass:  NSGY following, awaiting recommendations on biopsy/resection.  CT-CAP with multiple masses in RLL lung, pleura (with loculated R-pleural effusion), L-adrenal gland.                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  EVD set at 10cm.  Drained 141mL over past 24h.                 - RLS:  Continue Requip 0.5mg  qHS.  2)  Cardiovascular:  Neuroscience blood pressure goals 100-160.  Use labetalol/hydralazine per parameters.  Trop/EKG negative.                - Chronic Atrial Fibrillation:  HOLD Eliquis in preparation for surgery.  Continue Flecainide 100mg  bid, Coreg increased to 6.25mg  BID.  3)  Pulmonary:  Oxygenation/Ventilation adequate on current regimen. Continue incentive spirometry.                - COPD:  2LNC at baseline, on RA here.  Resume budesonide and Brovana.  Continue Claritin.  4)  Gastrointestinal:  Nutrition with NPO for OR. Continue bowel regimen.  5)  Renal:  No evidence of AKI. I/Os adequate.  Monitor electrolytes and resupplement prn. IV Fluids:  NS 63mL/h while NPO  6)  Hematological:  Hgb/Plts adequate. Continue DVT prophylaxis with SCDs, hold Lovenox x48h post OR (resume 5/26).  7)  Endocrine:  Continue insulin sliding scale to maintain glucose < 180.  Continue Synthroid 162mcg daily.  8)  Infectious Disease:  Afebrile, normal WBC, no evidence of infection.  9)  Lines/Access/Restraints:                 Lines:  EVD  Access:  Port, PIVs  Restraints:  none  Skin:  intact  10)  Disposition:  Remain in NSICU due to OR today, frequency of neurochecks after.  11)  Code Status:  FULL CODE    I was present at the bedside of this critically ill patient for 35 minutes exclusive of procedures that are documented elsewhere.  My services were independent and non-duplicative of other practioners of other specialties (non-Neurocritical  Care).  The care of this patient was  in regard to managing (a) conditions(s) that has a high probability of sudden, clinically significant, or life-threatening deterioration and required a high degree of Attending Physician attention and direct involvement to intervene urgently. Data review and care planning was performed in direct proximity of the patient, examination was obviously performed in direct contact with the patient. All of this time was exclusive of procedure which will be documented elsewhere in the chart.   My critical care time involved full attention to the patient's condition and included:   * Review of nursing notes and/or old charts   * Review of medications, allergies, and vital signs   * Documentation time   * Consultant collaboration on findings and treatment options   * Care, transfer of care, and discharge plans   * Ordering, interpreting, and reviewing diagnostic studies/ lab tests   * Obtaining necessary history from family, EMS, nursing home staff and/or treating physicians     My critical care time did not include time spent teaching resident physician(s) or other services of resident physicians, or performing other reported procedures.     Total Critical Care Time: 35 minutes    Dairl Ponder, MD  Neurocritical Care Attending  01/10/2021  14:30

## 2021-01-10 NOTE — Nurses Notes (Signed)
Pt off the unit at this time to OR

## 2021-01-10 NOTE — Nurses Notes (Signed)
2200: NCCU notified patient extremely combative (attempting to hit/kick/strangle). Service at bedside to atempt to deescalate patient. Haldol to be ordered. Family called to attempt to deescelate. Patient refused to have saline running, service aware.

## 2021-01-10 NOTE — Brief Op Note (Signed)
[facility]                                                     BRIEF OPERATIVE NOTE    Patient Name: Kemani, Heidel Number: [id number]  Date of Service: 01/10/2021   Date of Birth: 27-Mar-1951    All elements must be documented.    Pre-Operative Diagnosis: Obstructive hydrocephalus   Post-Operative Diagnosis: Same  Procedure(s)/Description: Right frontal ETV  Findings/Complexity (inherent to the procedure performed): ETV performed successfully.  Good flow across intraoperatively.     Attending Surgeon: Debbora Dus, MBBS, MCh, MBA  Assistant(s): Gery Pray. Radford Pax, MD, PhD    Anesthesia Type: General  Estimated Blood Loss: Minimal  Blood Given: None  Fluids Given: Crystalloid  Complications (not routinely expected or not inherent to difficulty/nature of procedure): None  Characteristic Event (routinely expected or inherent to the difficulty/nature of the procedure): None  Did the use of current and/or prior Anticoagulants impact the outcome of the case? no  Wound Class: Clean Wound: Uninfected operative wounds in which no inflammation occurred    Tubes: None  Drains: None  Specimens/ Cultures: None  Implants: None           Disposition: ICU - extubated and stable.  Condition: stable    Reola Calkins, MD, PhD  Department of Neurosurgery  PGY7

## 2021-01-10 NOTE — Anesthesia OR-ICU Handoff (Signed)
Anesthesia ICU Transfer of Care  Kelsey [name] is a 70 y.o. ,female, Weight: 130 kg (287 lb 4.2 oz)   had Procedure(s):  VENTRICULOSTOMY ENDOSCOPIC WITH NAVIGATION  CT SCAN  performed  01/10/21   Primary Service: Ronni Rumble, MD    Past Medical History:   Diagnosis Date   . A-fib (CMS HCC)    . Arthropathy, unspecified, site unspecified    . Asthma    . Cancer (CMS HCC)     cervical   . Cataract    . Colon cancer (CMS [facility]) 07/06/2014   . COPD (chronic obstructive pulmonary disease) (CMS HCC)    . Fistula    . HTN (hypertension)    . Hyperlipidemia    . Lung mass     with pleural effusion   . Obesity    . Oxygen dependent     2 liters nc    . Sleep apnea    . Ventral hernia       Allergy History as of 01/10/21     SHELLFISH CONTAINING PRODUCTS       Noted Status Severity Type Reaction    01/26/14 1139 Governor Rooks, LPN 33/43/56 Active High  Shortness of Breath, Rash, Itching    Comments: scallops     04/11/12 1159 Reatha Harps, RN 04/11/12 Active High  Shortness of Breath, Rash, Itching          IODINE AND IODIDE CONTAINING PRODUCTS       Noted Status Severity Type Reaction    07/06/14 1346 Maura Crandall 07/06/14 Active       Comments: Itching, shortness of breath            VANCOMYCIN       Noted Status Severity Type Reaction    10/01/17 0653 Nance Pear, RN 10/01/17 Active High  Shortness of Breath          BARIUM IODIDE       Noted Status Severity Type Reaction    04/02/18 0935 Hamrick, [place], MA 04/02/18 Active                 I completed my ICU transfer of care/ Handoff to the ICU receiving personnel during which we discussed :  Access, Airway, All key and critical aspects of case discussed, Analgesia, Antibiotics, Expectation of post procedure, Fluids/Product, Gave opportunity for questions and acknowledgement of understanding, Labs and PMHx                                                  Additional Info:Pt to NCCU bed 17, VSS, fully monitored, airway patent on 2L NC, bedside report given to  NCCU RN, VAS 0/10                       Last OR Temp: Temperature: 36.5 C (97.7 F)  ABG:  PCO2 (VENOUS)   Date Value Ref Range Status   12/10/2019 41.00 38.00 - 50.00 mm/Hg Final     PO2 (VENOUS)   Date Value Ref Range Status   12/10/2019 34.0 30.0 - 50.0 mm/Hg Final     POTASSIUM   Date Value Ref Range Status   01/10/2021 3.4 (L) 3.5 - 5.1 mmol/L Final   12/23/2018 0  Final     KETONES   Date Value Ref Range Status  01/09/2021 Negative Negative mg/dL Final     CALCIUM   Date Value Ref Range Status   01/10/2021 8.6 (L) 8.8 - 10.2 mg/dL Final   12/23/2018 0  Final     Calculated P Axis   Date Value Ref Range Status   01/08/2021 46 degrees Final     Calculated R Axis   Date Value Ref Range Status   01/08/2021 50 degrees Final     Calculated T Axis   Date Value Ref Range Status   01/08/2021 93 degrees Final     BASE EXCESS   Date Value Ref Range Status   10/22/2019 2.0 -2.0 - 3.0 mmol/L Final     BASE DEFICIT   Date Value Ref Range Status   12/10/2019 1.5 -2.0 - 3.0 mmol/L Final     BICARBONATE (VENOUS)   Date Value Ref Range Status   12/10/2019 22.8 (L) 23.0 - 33.0 mmol/L Final     %FIO2 (VENOUS)   Date Value Ref Range Status   12/10/2019 21.0 % Final     Airway:* No LDAs found *

## 2021-01-10 NOTE — Nurses Notes (Signed)
Pt returned to room [id number] from [place] transported by OR staff. Assessment and vitals per flowsheet.

## 2021-01-10 NOTE — Progress Notes (Signed)
[facility]  NEUROSURGERY   PROGRESS NOTE      Kelsey [name], Kelsey [name], 70 y.o. female  Date of Admission:  01/08/2021  Date of Service: 01/10/2021  Date of Birth:  [birth year]    Referring Physician:  Henrine Screws, FNP    Chief Complaint: Confusion  Subjective:   OR today. NAEO.     Vital Signs:  Temp (24hrs) Max:37.1 C (98.9 F)      Systolic (21JHE), RDE:081 , Min:133 , KGY:185     Diastolic (63JSH), FWY:63, Min:61, Max:91    Temp  Avg: 36.8 C (98.3 F)  Min: 36.7 C (98.1 F)  Max: 37.1 C (98.8 F)  MAP (Non-Invasive)  Avg: 96.4 mmHG  Min: 80 mmHG  Max: 112 mmHG  Pulse  Avg: 100.5  Min: 80  Max: 155  Resp  Avg: 20.7  Min: 12  Max: 28  SpO2  Avg: 94.8 %  Min: 92 %  Max: 98 %       Today's Physical Exam:  General: Appears stated age, NAD  Cardio: Radial pulses intact bilaterally  ENT: Trachea midline  Respiratory: Regular respirations  Skin: Skin pink    Neurologic Exam:  Appears stated age, NAD  A&Ox3  GCS 4 6 5   Fluent speech  Appropriate fund of knowledge  Appropriate attention span & concentration  Appropriate recent and remote memory  Sclera white  Trachea midline  Regular respirations  Skin will perfused  Mouth symmetric  CN 2 PERRL  CN 3 4 6  EOMI  CN 5 facial sensation grossly intact  CN 7 Face symmetric  CN 8 Hearing grossly intact  CN 11 shrug symmetric  CN 12 Tongue midline  Muscle Strength 5/5 BUE and BLE  Muscle tone WNL  SILT BUE and BLE  No drift  No hoffman   Plantars downgoing  No clonus    R frontal EVD secured in place    Current Medications:  acetaminophen (TYLENOL) tablet, 650 mg, Oral, Q4H PRN  albuterol (PROVENTIL) 2.5 mg / 3 mL (0.083%) neb solution, 2.5 mg, Nebulization, Q4H PRN  arformoterol (BROVANA) 15 mcg/2 mL nebulizer solution, 15 mcg, Nebulization, 2x/day  atorvastatin (LIPITOR) tablet, 10 mg, Oral, QPM  budesonide (PULMICORT RESPULES) 0.5 mg/2 mL nebulizer suspension, 0.5 mg, Nebulization, 2x/day  carvedilol (COREG) tablet, 3.125 mg, Oral, 2x/day-Food  famotidine (PEPCID)  tablet, 20 mg, Oral, 2x/day  ferrous sulfate 300mg  (60 mg elemental IRON) per 62mL oral liquid, 60 mg, Oral, EVERY OTHER DAY  flecainide (TAMBOCOR) tablet, 100 mg, Oral, 2x/day  hydrALAZINE (APRESOLINE) injection 10 mg, 10 mg, Intravenous, Q4H PRN  levothyroxine (SYNTHROID) tablet, 112 mcg, Oral, QAM  loperamide (IMODIUM) capsule, 2 mg, Oral, Q4H PRN  loratadine (CLARITIN) tablet, 10 mg, Oral, Daily  magnesium hydroxide (MILK OF MAGNESIA) 400mg  per 29mL oral liquid, 15 mL, Oral, 3x/day  magnesium oxide (MAG-OX) tablet, 400 mg, Oral, Daily  metoprolol (LOPRESSOR) 1 mg/mL injection, 5 mg, Intravenous, Q4H PRN  NS flush syringe, 2-6 mL, Intracatheter, Q8HRS   And  NS flush syringe, 2-6 mL, Intracatheter, Q1 MIN PRN  NS premix infusion, , Intravenous, Continuous  ondansetron (ZOFRAN) 2 mg/mL injection, 4 mg, Intravenous, Q6H PRN  Pharmacist FYI - K[employer] Ordered, , Does not apply, Daily PRN  polyethylene glycol (MIRALAX) oral packet, 17 g, Oral, Daily  rOPINIRole (REQUIP) tablet, 0.5 mg, Oral, QPM  sennosides-docusate sodium (SENOKOT-S) 8.6-50mg  per tablet, 1 Tablet, Oral, Daily      Assessment/ Plan:  Active Hospital Problems    Diagnosis   .  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 Morbid obesity with BMI of 45.0-49.9, adult (CMS HCC)     Kelsey [name] is a 70 y.o. 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 PPD1/POD0     -- R frontal EVD @ 20 cmH2O   -- CSF MWF   -- Imaging:                -- MRI brain wwo (01/09/21): Intracranial metastatic disease. most prominent of these involves the midbrain tectum on the left with evidence of hemorrhage.This measures approximately 1.1 cm in diameter.  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 Overall, this is unchanged in appearance from March 2022.                -- CT brain wo (outside facility): on synapse, reviewed  -- Pain/spasm control: Tylenol PRN  -- Diet: NPO+mIVF  -- Bowel regimen, last BM 01/09/21  -- Abx: none  -- Activity: ambulate as tolerated w/ assist  -- PT/OT: ordered   -- DVT ppx: SCDs/Venodynes  -- Consults:                 None  -- Lines/Drains: none  -- Wound: none  -- Disposition: none    Rockey Situ, MD PhD  PGY-2, Neurosurgery  Pager: [phone]                I saw and examined the patient.  I reviewed the resident's note.  I agree with the findings and plan of care as documented in the resident's note.  Any exceptions/additions are edited/noted.    Debbora Dus, MD

## 2021-01-10 NOTE — Anesthesia Transfer of Care (Signed)
ANESTHESIA TRANSFER OF CARE   Kelsey [name] is a 70 y.o. ,female, Weight: 130 kg (287 lb 4.2 oz)   had Procedure(s):  VENTRICULOSTOMY ENDOSCOPIC WITH NAVIGATION  CT SCAN  performed  01/10/21   Primary Service: Ronni Rumble, MD    Past Medical History:   Diagnosis Date   . A-fib (CMS HCC)    . Arthropathy, unspecified, site unspecified    . Asthma    . Cancer (CMS HCC)     cervical   . Cataract    . Colon cancer (CMS [place]) 07/06/2014   . COPD (chronic obstructive pulmonary disease) (CMS HCC)    . Fistula    . HTN (hypertension)    . Hyperlipidemia    . Lung mass     with pleural effusion   . Obesity    . Oxygen dependent     2 liters nc    . Sleep apnea    . Ventral hernia       Allergy History as of 01/10/21     SHELLFISH CONTAINING PRODUCTS       Noted Status Severity Type Reaction    01/26/14 1139 Governor Rooks, LPN 62/95/28 Active High  Shortness of Breath, Rash, Itching    Comments: scallops     04/11/12 1159 Reatha Harps, RN 04/11/12 Active High  Shortness of Breath, Rash, Itching          IODINE AND IODIDE CONTAINING PRODUCTS       Noted Status Severity Type Reaction    07/06/14 1346 Maura Crandall 07/06/14 Active       Comments: Itching, shortness of breath            VANCOMYCIN       Noted Status Severity Type Reaction    10/01/17 0653 Nance Pear, RN 10/01/17 Active High  Shortness of Breath          BARIUM IODIDE       Noted Status Severity Type Reaction    04/02/18 0935 Hamrick, [place], [place] 04/02/18 Active                 I completed my transfer of care / handoff to the receiving personnel during which we discussed:  Access, Airway, Analgesia, All key/critical aspects of case discussed, Antibiotics, Expectation of post procedure, Fluids/Product, Gave opportunity for questions and acknowledgement of understanding, Labs and PMHx                                              Additional Info:Pt to NCCU bed 17, VSS, Airway  Patent on 6L SFM, bedside report given to NCCU team,  VAS  0/10                        Last OR Temp: Temperature: 36.5 C (97.7 F)  ABG:  PCO2 (VENOUS)   Date Value Ref Range Status   12/10/2019 41.00 38.00 - 50.00 mm/Hg Final     PO2 (VENOUS)   Date Value Ref Range Status   12/10/2019 34.0 30.0 - 50.0 mm/Hg Final     POTASSIUM   Date Value Ref Range Status   01/10/2021 3.4 (L) 3.5 - 5.1 mmol/L Final   12/23/2018 0  Final     KETONES   Date Value Ref Range Status   01/09/2021 Negative Negative mg/dL Final  CALCIUM   Date Value Ref Range Status   01/10/2021 8.6 (L) 8.8 - 10.2 mg/dL Final   12/23/2018 0  Final     Calculated P Axis   Date Value Ref Range Status   01/08/2021 46 degrees Final     Calculated R Axis   Date Value Ref Range Status   01/08/2021 50 degrees Final     Calculated T Axis   Date Value Ref Range Status   01/08/2021 93 degrees Final     BASE EXCESS   Date Value Ref Range Status   10/22/2019 2.0 -2.0 - 3.0 mmol/L Final     BASE DEFICIT   Date Value Ref Range Status   12/10/2019 1.5 -2.0 - 3.0 mmol/L Final     BICARBONATE (VENOUS)   Date Value Ref Range Status   12/10/2019 22.8 (L) 23.0 - 33.0 mmol/L Final     %FIO2 (VENOUS)   Date Value Ref Range Status   12/10/2019 21.0 % Final     Airway:* No LDAs found *  Blood pressure (!) 153/74, pulse 82, temperature 36.5 C (97.7 F), resp. rate 19, height 1.6 m (5\' 3" ), weight 130 kg (287 lb 4.2 oz), SpO2 97 %.

## 2021-01-10 NOTE — OR Surgeon (Unsigned)
PATIENT NAME: Kelsey [name] NUMBER:  [id number]  DATE OF SERVICE: 01/10/2021  DATE OF BIRTH:  [birth year]    OPERATIVE REPORT    PREOPERATIVE DIAGNOSIS:  Obstructive hydrocephalus secondary to malignant metastatic lesion in the midbrain.    POSTOPERATIVE DIAGNOSIS:  Obstructive hydrocephalus secondary to malignant metastatic lesion in the midbrain.    NAME OF PROCEDURE:  Right frontal bur hole and endoscopic third ventriculostomy following removal of the right frontal external ventricular drain.    SURGEON:  Debbora Dus, MD.    ASSISTANT:  Thresa Ross, MD PhD.    ANESTHESIA:  General.    POSITION:  Supine.    ESTIMATED BLOOD LOSS:  Minimal.    COMPLICATIONS:  None.    INDICATION FOR PROCEDURE:  The patient is a 70 year old lady with a past medical history of atrial fibrillation and 3 known malignancies involving the lung, cervix, and colon.  She presented to an outside facility with memory changes.  She underwent a CT brain that showed obstructive hydrocephalus with a mass in aqueductal region.  She was transferred here for further care.  She underwent placement of a right frontal EVD and then had an MRI scan that showed metastatic lesion in the midbrain tectal area ocstructing the aqueduct of Sylvius, as well as in the right frontal lobe and the posterior right frontal lobe also.  Because of her obstructive hydrocephalus, the patient was offered endoscopic third ventriculostomy.  The risks, benefits, indications, and procedures were discussed with the patient's family in detail and consent was obtained.    DESCRIPTION OF PROCEDURE:  The patient was brought to the operating room by the anesthesia team.  She underwent a successful induction and intubation.  She was positioned supine on the operating table.  The Stealth navigation system was utilized to register the patient's CT BrainLAB protocol and develop a trajectory for placement of the endoscope.  We elected to use the same trajectory as the  preexisting external ventricular drain in the right frontal area.  Once registration was completed, the external ventricular drain was removed and the exit site was closed with staples.  Some hair was clipped from the right frontal area.  Parts were prepped and draped in the usual sterile fashion utilizing the same incision as for the EVD, we extended the incision anteriorly and posteriorly and then opened it sharply with a #10 skin blade after the time-out had been carried out.  Skin edges were held apart with a Weitlaner and a craniotome was used to place a bur hole adjacent to the EVD twist drill hole.  The bur hole was incomplete and was then enlarged with Kerrison rongeurs #2 and #3.  The dura was coagulated and using the same tract as for the external ventricular drain, peel-away sheath was then introduced into the right ventricle in the same trajectory as the external ventricular drain.  This was placed to a depth of 5 cm.  The 0 degree Aesculap endoscope was then placed through the peel-away sheath into the lateral ventricle.  The foramen of Monro was identified as well as the third ventricle.  We then passed a Bugbee wire anterior to the mamillary bodies in the midline, piercing the thin floor of the third ventricle in this region.  The Bugbee wire was then removed and a 2-French Fogarty catheter was then placed through the same hole into the prepontine space.  The balloon was then inflated and gradually withdrawn, enlarging the tract for the endoscopic third ventriculostomy.  We then passed the scope through the ventriculostomy to ensure that the membrane of Liliequist was opened and the prepontine cistern was communicating well with the third ventricle.  The endoscope and the sheath were then withdrawn.  Thorough irrigation was carried out.  A small piece of Gelfoam was then placed into the bur hole.  The incision was then closed in layers using 2-0 Vicryl for the galea and staples for the skin.  At the  conclusion of the procedure, the patient was extubated and taken to the recovery room in stable condition.  Once again, no problems or complications were encountered.        Debbora Dus, MD  Associate Professor   [facility] Neurosurgery              DD:  01/10/2021 18:08:55  DT:  01/10/2021 20:32:45 DG  D#:  [id number]

## 2021-01-10 NOTE — Nurses Notes (Signed)
MD [place] w/ NCCU notifed of pt's ICPs in 30's HR in the 120-130.   Lennice Sites, RN  01/10/2021, 11:14

## 2021-01-10 NOTE — Progress Notes (Signed)
[facility]  NEUROSURGERY   PROGRESS NOTE      Kelsey [name], Kelsey [name], 70 y.o. female  Date of Admission:  01/08/2021  Date of Service: 01/10/2021  Date of Birth:  [birth year]    Referring Physician:  Henrine Screws, FNP    Chief Complaint: Confusion  Subjective: Underwent R frontal ETV.    Vital Signs:  Temp (24hrs) Max:37.1 C (57.8 F)      Systolic (46NGE), XBM:841 , Min:75 , LKG:401     Diastolic (02VOZ), DGU:44, Min:58, Max:116    Temp  Avg: 36.8 C (98.2 F)  Min: 36.5 C (97.7 F)  Max: 37.1 C (98.8 F)  MAP (Non-Invasive)  Avg: 95.1 mmHG  Min: 65 mmHG  Max: 128 mmHG  Pulse  Avg: 101.4  Min: 64  Max: 155  Resp  Avg: 20.8  Min: 12  Max: 28  SpO2  Avg: 95 %  Min: 93 %  Max: 97 %       Today's Physical Exam:  Waking up from surgery  MAE x 4 against gravity  -- Incision C/D/I w/ staples    Current Medications:  acetaminophen (TYLENOL) tablet, 650 mg, Oral, Q4H PRN  albuterol (PROVENTIL) 2.5 mg / 3 mL (0.083%) neb solution, 2.5 mg, Nebulization, Q4H PRN  arformoterol (BROVANA) 15 mcg/2 mL nebulizer solution, 15 mcg, Nebulization, 2x/day  atorvastatin (LIPITOR) tablet, 10 mg, Oral, QPM  budesonide (PULMICORT RESPULES) 0.5 mg/2 mL nebulizer suspension, 0.5 mg, Nebulization, 2x/day  carvedilol (COREG) tablet, 6.25 mg, Oral, 2x/day-Food  [START ON 01/11/2021] ceFAZolin (ANCEF) 3 g in D5W 100 mL IVPB, 3 g, Intravenous, Q8H  dilTIAZem (CARDIZEM) 125 mg in NS 125 mL (tot vol) infusion, 5 mg/hr, Intravenous, Continuous  famotidine (PEPCID) tablet, 20 mg, Oral, 2x/day  ferrous sulfate 300mg  (60 mg elemental IRON) per 14mL oral liquid, 60 mg, Oral, EVERY OTHER DAY  flecainide (TAMBOCOR) tablet, 100 mg, Oral, 2x/day  hydrALAZINE (APRESOLINE) injection 10 mg, 10 mg, Intravenous, Q4H PRN  levothyroxine (SYNTHROID) tablet, 112 mcg, Oral, QAM  loperamide (IMODIUM) capsule, 2 mg, Oral, Q4H PRN  loratadine (CLARITIN) tablet, 10 mg, Oral, Daily  magnesium hydroxide (MILK OF MAGNESIA) 400mg  per 23mL oral liquid, 15 mL, Oral,  3x/day  magnesium oxide (MAG-OX) tablet, 400 mg, Oral, Daily  metoprolol (LOPRESSOR) 1 mg/mL injection, 5 mg, Intravenous, Q4H PRN  morphine 2 mg/mL injection, 2 mg, Intravenous, Q4H PRN  NS flush syringe, 2-6 mL, Intracatheter, Q8HRS   And  NS flush syringe, 2-6 mL, Intracatheter, Q1 MIN PRN  NS premix infusion, , Intravenous, Continuous  ondansetron (ZOFRAN) 2 mg/mL injection, 4 mg, Intravenous, Q6H PRN  oxyCODONE-acetaminophen (PERCOCET) 5-325mg  per tablet, 1 Tablet, Oral, Q4H PRN   Or  oxyCODONE-acetaminophen (PERCOCET) 5-325mg  per tablet, 2 Tablet, Oral, Q4H PRN  Pharmacist FYI - K[employer] Ordered, , Does not apply, Daily PRN  polyethylene glycol (MIRALAX) oral packet, 17 g, Oral, Daily  rOPINIRole (REQUIP) tablet, 0.5 mg, Oral, QPM  sennosides-docusate sodium (SENOKOT-S) 8.6-50mg  per tablet, 1 Tablet, Oral, Daily  SSIP insulin lispro 100 units/mL injection, 0-12 Units, Subcutaneous, Q4H PRN      Assessment/ Plan:  Active Hospital Problems    Diagnosis   . 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 Morbid obesity with BMI of 45.0-49.9, adult (CMS HCC)     Kelsey [name] is a 70 y.o. female with PMH of A-fib (on Eliquis, reportedly last dose 5/22), and possible known malignancy (lung, cervical, colon) found to have a brain mass  in the pineal region on CT brain with associated hydrocephalus s/p EVD and now R frontal ETV.  PPD1/POD0.  -- Imaging:   -- CT brain wo (01/10/21): ORDERED   -- MRI brain wwo (01/09/21): Intracranial metastatic disease. most prominent of these involves the midbrain tectum on the left with evidence of hemorrhage.This measures approximately 1.1 cm in diameter.  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 Overall, this is unchanged in appearance from March 2022.              -- CT brain wo (outside facility): on synapse, reviewed  -- Pain/spasm control: Tylenol PRN, Percocet PRN, Morphine PRN  -- Diet: Cardiac  -- Bowel regimen, last BM 01/10/21  -- Abx: Ancef periop  -- Activity: Ambulate as tolerated w/ assist  -- PT/OT: Ordered   -- DVT ppx: SCDs/Venodynes  -- Consults: None  -- Lines/Drains: Foley  -- Wound: Staples  -- Disposition: none    Reola Calkins, MD, PhD  Department of Neurosurgery  PGY7

## 2021-01-11 ENCOUNTER — Inpatient Hospital Stay (HOSPITAL_COMMUNITY): Payer: Medicare Other | Admitting: Emergency Medicine

## 2021-01-11 ENCOUNTER — Other Ambulatory Visit: Payer: Self-pay

## 2021-01-11 DIAGNOSIS — D497 Neoplasm of unspecified behavior of endocrine glands and other parts of nervous system: Secondary | ICD-10-CM

## 2021-01-11 DIAGNOSIS — R911 Solitary pulmonary nodule: Secondary | ICD-10-CM

## 2021-01-11 DIAGNOSIS — Z85038 Personal history of other malignant neoplasm of large intestine: Secondary | ICD-10-CM

## 2021-01-11 DIAGNOSIS — C3490 Malignant neoplasm of unspecified part of unspecified bronchus or lung: Secondary | ICD-10-CM

## 2021-01-11 DIAGNOSIS — C189 Malignant neoplasm of colon, unspecified: Secondary | ICD-10-CM

## 2021-01-11 LAB — CBC
HCT: 38.4 % (ref 34.8–46.0)
HGB: 11.9 g/dL (ref 11.5–16.0)
MCH: 24.8 pg — ABNORMAL LOW (ref 26.0–32.0)
MCHC: 31 g/dL (ref 31.0–35.5)
MCV: 80.2 fL (ref 78.0–100.0)
MPV: 9.3 fL (ref 8.7–12.5)
PLATELETS: 225 10*3/uL (ref 150–400)
RBC: 4.79 10*6/uL (ref 3.85–5.22)
RDW-CV: 18 % — ABNORMAL HIGH (ref 11.5–15.5)
WBC: 9.6 10*3/uL (ref 3.7–11.0)

## 2021-01-11 LAB — BASIC METABOLIC PANEL
ANION GAP: 8 mmol/L (ref 4–13)
BUN/CREA RATIO: 25 — ABNORMAL HIGH (ref 6–22)
BUN: 18 mg/dL (ref 8–25)
CALCIUM: 8.2 mg/dL — ABNORMAL LOW (ref 8.8–10.2)
CHLORIDE: 105 mmol/L (ref 96–111)
CO2 TOTAL: 25 mmol/L (ref 23–31)
CREATININE: 0.71 mg/dL (ref 0.60–1.05)
ESTIMATED GFR: 87 mL/min/BSA (ref >=60)
GLUCOSE: 115 mg/dL (ref 65–125)
POTASSIUM: 4.3 mmol/L (ref 3.5–5.1)
SODIUM: 138 mmol/L (ref 136–145)

## 2021-01-11 LAB — MAGNESIUM: MAGNESIUM: 2.2 mg/dL (ref 1.8–2.6)

## 2021-01-11 LAB — PHOSPHORUS: PHOSPHORUS: 2.7 mg/dL (ref 2.3–4.0)

## 2021-01-11 MED ORDER — ARFORMOTEROL 15 MCG/2 ML SOLUTION FOR NEBULIZATION
INHALATION_SOLUTION | RESPIRATORY_TRACT | Status: AC
Start: 2021-01-11 — End: 2021-01-11
  Filled 2021-01-11: qty 1

## 2021-01-11 MED ORDER — BUDESONIDE 0.5 MG/2 ML SUSPENSION FOR NEBULIZATION
INHALATION_SUSPENSION | RESPIRATORY_TRACT | Status: AC
Start: 2021-01-11 — End: 2021-01-11
  Filled 2021-01-11: qty 12

## 2021-01-11 NOTE — Progress Notes (Signed)
[facility]  NEUROSURGERY   PROGRESS NOTE      Kelsey, [name], 70 y.o. female  Date of Admission:  01/08/2021  Date of Service: 01/11/2021  Date of Birth:  1951-03-22    Referring Physician:  Henrine Screws, FNP    Chief Complaint: Confusion  Subjective: Experiencing significant delirium overnight    Vital Signs:  Temp (24hrs) Max:37.1 C (16.1 F)      Systolic (09UEA), VWU:981 , Min:138 , XBJ:478     Diastolic (29FAO), ZHY:86, Min:76, Max:107    Temp  Avg: 36.8 C (98.3 F)  Min: 36.7 C (98.1 F)  Max: 37.1 C (98.8 F)  MAP (Non-Invasive)  Avg: 109.5 mmHG  Min: 93 mmHG  Max: 122 mmHG  Pulse  Avg: 85.5  Min: 79  Max: 96  Resp  Avg: 18  Min: 11  Max: 23  SpO2  Avg: 94 %  Min: 92 %  Max: 96 %       Today's Physical Exam:  Neurologic Exam:  A&Ox1  GCS 4 6 4   Fluent speech  Diminished fund of knowledge  Appropriate attention span & concentration  Diminished recent and remote memory  CN 2 PERRL  CN 3 4 6  EOMI  CN 7 Face symmetric  CN 8 Hearing grossly intact  CN 11 shrug symmetric  CN 12 Tongue midline  Muscle Strength 5/5 BUE and BLE  Muscle tone WNL  SILT BUE and BLE  No drift  No hoffman   Plantars downgoing  No clonus  -- Incision C/D/I w/ staples    Current Medications:  acetaminophen (TYLENOL) tablet, 650 mg, Oral, Q4H PRN  albuterol (PROVENTIL) 2.5 mg / 3 mL (0.083%) neb solution, 2.5 mg, Nebulization, Q4H PRN  arformoterol (BROVANA) 15 mcg/2 mL nebulizer solution, 15 mcg, Nebulization, 2x/day  atorvastatin (LIPITOR) tablet, 10 mg, Oral, QPM  budesonide (PULMICORT RESPULES) 0.5 mg/2 mL nebulizer suspension, 0.5 mg, Nebulization, 2x/day  carvedilol (COREG) tablet, 6.25 mg, Oral, 2x/day-Food  famotidine (PEPCID) tablet, 20 mg, Oral, 2x/day  ferrous sulfate 300mg  (60 mg elemental IRON) per 39mL oral liquid, 60 mg, Oral, EVERY OTHER DAY  flecainide (TAMBOCOR) tablet, 100 mg, Oral, 2x/day  hydrALAZINE (APRESOLINE) injection 10 mg, 10 mg, Intravenous, Q4H PRN  levothyroxine (SYNTHROID) tablet, 112 mcg,  Oral, QAM  loperamide (IMODIUM) capsule, 2 mg, Oral, Q4H PRN  loratadine (CLARITIN) tablet, 10 mg, Oral, Daily  magnesium hydroxide (MILK OF MAGNESIA) 400mg  per 105mL oral liquid, 15 mL, Oral, 3x/day  magnesium oxide (MAG-OX) tablet, 400 mg, Oral, Daily  metoprolol (LOPRESSOR) 1 mg/mL injection, 5 mg, Intravenous, Q4H PRN  morphine 2 mg/mL injection, 2 mg, Intravenous, Q4H PRN  NS flush syringe, 2-6 mL, Intracatheter, Q8HRS   And  NS flush syringe, 2-6 mL, Intracatheter, Q1 MIN PRN  ondansetron (ZOFRAN) 2 mg/mL injection, 4 mg, Intravenous, Q6H PRN  oxyCODONE-acetaminophen (PERCOCET) 5-325mg  per tablet, 1 Tablet, Oral, Q4H PRN   Or  oxyCODONE-acetaminophen (PERCOCET) 5-325mg  per tablet, 2 Tablet, Oral, Q4H PRN  Pharmacist FYI - K[employer]ra Ordered, , Does not apply, Daily PRN  polyethylene glycol (MIRALAX) oral packet, 17 g, Oral, Daily  rOPINIRole (REQUIP) tablet, 0.5 mg, Oral, QPM  sennosides-docusate sodium (SENOKOT-S) 8.6-50mg  per tablet, 1 Tablet, Oral, Daily      Assessment/ Plan:  Kelsey [name] is a 70 y.o. 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  PPD2/POD1.  -- Imaging:   -- CT brain wo (01/10/21): EVD removed, ventricles stable   -- MRI brain wwo (01/09/21): Intracranial metastatic disease. most prominent of these involves the midbrain tectum on the left with evidence of hemorrhage.This measures approximately 1.1 cm in diameter.  This results in increased ventricular prominence when compared to the study of 2019, but unchanged when compared to the study performed earlier on this date   -- MRI C/T/L Spine (01/09/21): No metastasis disease              -- CT CAP w (01/09/21): Spiculated mass-like right hilar/perihilar consolidation likely corresponds to the known history of right lower lobe adenocarcinoma status post  radiation treatment. Overall, this is unchanged in appearance from March 2022.              -- CT brain wo (outside facility): on synapse, reviewed  -- Pain/spasm control: Tylenol PRN, Percocet PRN, Morphine PRN  -- Diet: Cardiac  -- Bowel regimen, last BM 01/11/21  -- Abx: none  -- Activity: Ambulate as tolerated w/ assist  -- PT/OT: Ordered   -- DVT ppx: SCDs/Venodynes  -- Consults: None  -- Lines/Drains: Foley  -- Wound: Staples  -- Disposition: none    Zane Herald, MD PhD  PGY-2    01/11/2021;22:24      I saw and examined the patient.  I reviewed the resident's note.  I agree with the findings and plan of care as documented in the resident's note.  Any exceptions/additions are edited/noted.    Debbora Dus, MD

## 2021-01-11 NOTE — Care Plan (Signed)
[facility]  Speech Therapy Initial Swallow Evaluation     Patient Name: Kelsey [name]  Date of Birth: 1950/11/01  Height: Height: 160 cm (_0 )  Weight: Weight: 130 kg (287 lb 4.2 oz)  Room/Bed: 17/A  Payor: MEDICARE / Plan: MEDICARE PART A AND B / Product Type: Medicare /      Assessment:  Functional Level At Time Of Eval: No overt s/s of aspiration with thin liquids or mechanical soft solids. Pt demonstrated difficulty masticating graham cracker and throat clearing prior to swallow. Pt reports pain with masticating regular solids. Pt demonstrated functional oral phase with graham crackers softened with milk and no throat clearing. Pt confused with poor recall throughout session. Oropharyngeal swallow WFL for mechanical soft solids/thin level 0 liquids.  SLP Swallowing Diagnosis: oral dysfunction  SLP Diet Recommendation: thin liquids, mechanical soft  Aspiration Precautions:    Signs/Symptoms of Aspiration Noted (Swallowing): throat clearing           Discharge Needs:  Discharge Disposition:   (will need Justification of D/C recommendation if placement is required.)    JUSTIFICATION OF DISCHARGE RECOMMENDATION   Based on current diagnosis, functional performance prior to admission, and current functional performance, this patient requires continued SLP services in  defer PT/OT in order to achieve significant functional improvements for Cognitive/Linguistic and Swallowing.       Plan:  To provide Speech Therapy services other (see comments) (1-3 times per week) for duration of until discharge.     The risks/benefits of therapy have been discussed with the patient/caregiver and he/she is in agreement with the established plan of care.         Subjective & Objective        01/11/21 0930   Therapist Pager   SLP Pager Gavina Dildine [phone]   Rehab Session   Document Type evaluation   Total SLP Minutes: 30   General Information   Patient Profile Reviewed yes   Pertinent History of Current  Functional Problem Kelsey [name] is a 70 y.o. 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   Mutuality/Individual Preferences   Individualized Care Needs mech soft/thin   Functional Status Prior   Communication 0 - understands/communicates without difficulty   Swallowing 0 - swallows foods/liquids without difficulty   Coping/Psychosocial   Observed Emotional State calm;cooperative   Verbalized Emotional State acceptance   Cognitive   Cognitive/Neuro/Behavioral WDL ex   Level of Consciousness alert;confused   Arousal Level opens eyes spontaneously   Orientation disoriented to;place;time;situation   Speech illogical   Mood/Behavior calm;cooperative   Cognitive Assessment/Interventions   Orientation Status disoriented to;place;situation;time   Attention needs re-direction   Follows Commands follows one step commands;increased processing time needed   Pain Assessment   Pre/Posttreatment Pain Comment No c/o pain   Oral Motor Structure and Function    Additional Documentation Oral Motor Structure/Functional Assessment (Group)   Oral Motor Structure/Functional Assessment   Dentition (Oral Motor) edentulous, does not have dentures   Oral Musculature (Oral Motor Assessment) WFL   Non Instrumental/Clinical Swallow (NIS)   Additional Documentation Thin Liquid Trial (NIS) (Group);Semi-Solid Texture Trial (NIS) (Group);Solid Texture Trial (NIS) (Group)   Thin Liquid Trial (NIS)   Mode of Presentation, Thin Liquid (NIS) straw   Oral Phase Results, Thin Liquid (NIS)  intact oral phase without signs of dysfunction   Pharyngeal Phase Results, Thin Liquid (NIS) safe swallow, no signs/symptoms of aspiration or penetration   Semi-Solid Texture Trial (NIS)   Mode of Presentation, Semi-Solid (NIS) self-fed   Oral Phase Results, Semi-Solid (NIS)  intact oral phase without signs of dysfunction   Pharyngeal Phase Results, Semi-Solid (NIS) safe swallow, no signs/symptoms of aspiration or penetration   Solid Texture Trial (NIS)   Mode of Presentation, Solid Food (NIS) self-fed   Oral Phase Results, Solid Food (NIS) impaired oral phase, signs of dysfunction present   Pharyngeal Phase Results, Solid Food (NIS) impaired pharyngeal phase of swallowing   Swallowing Concerns Noted, Solid Food (NIS) throat clearing noted   Plan of Care Review   Plan Of Care Reviewed With patient   Swallowing Clinical Impression   SLP Swallowing Diagnosis oral dysfunction   Rehab Potential/Prognosis (Swallow Eval) good, to achieve stated therapy goals   Functional Level At Time Of Eval No overt s/s of aspiration with thin liquids or mechanical soft solids. Pt demonstrated difficulty masticating graham crcaker and throat cleared. Pt reports pain with masticating regular solids. Pt demonstrated functional oral phase with graham crackers softened with milk and no throat clearing. Pt confused with poor recall throughout session. Oropharyngeal swallow WFL for mechanical soft solids/thin level 0 liquids.   Criteria for Skilled Merri Brunette Met demonstrates skilled criteria for intervention   Therapy Frequency other (see comments)  (1-3 times per week)   Predicted Duration Therapy Interv (days) until discharge   Expected Duration Therapy Session - minutes 15-30 minutes   SLP Diet Recommendation thin liquids;mechanical soft   Signs/Symptoms of Aspiration Noted (Swallowing) throat clearing   Plan of care reviewed with: MD;PT;RN   Dysphagia Goals, SLP   Date Established (Dysphagia Goal, SLP) 01/11/21   Time Frame (Dysphagia Goal, SLP) by discharge   Dysphagia Goal, Oral Nutritional Level Goal no signs/symptoms of aspiration present;safely tolerates recommended diet texture;safely tolerates recommended liquid viscosity;safely tolerates/maintains adequate oral nutrition        Therapist:  Franki Cabot, SLP   Pager #: [phone]  Phone #: [phone]

## 2021-01-11 NOTE — Nurses Notes (Signed)
Report called to [facility], questions adequately answered. Room still being cleaned, will transport via wheelchair when bed is clean and ready. Telemetry called for telepack, received and placed on patient.   Lennice Sites, RN  01/11/2021, 11:15

## 2021-01-11 NOTE — Consults (Signed)
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 She underwent ETV right frontal.  She is doing well. She has no active complaints. She has no neurological symptoms. She says she knows what is going on.   Denies headache, blurry vision, N/V/D, abdominal pain, pedal edema  ?  Hematology/[place] History:  Oncology History   Malignant neoplasm of lung, unspecified laterality, unspecified part of lung (CMS HCC)   06/26/2018 Initial Diagnosis    Malignant neoplasm of lung, unspecified laterality, unspecified part of lung (CMS [place])     07/23/2018 - 09/10/2018 Chemotherapy Regimen    Plan: PACLITAXEL / CARBOPLATIN WITH RADIATION (LUNG) Start Date: 07/23/2018          Chemotherapy Regimen    Plan:   DURVALUMAB Start Date:   10/31/2018           10/31/2018 -  Chemotherapy Regimen    Plan: DURVALUMAB Start Date: 10/31/2018           ?  Past Medical History:  Past Medical History:   Diagnosis Date   . A-fib (CMS HCC)    . Arthropathy, unspecified, site unspecified    . Asthma    . Cancer (CMS HCC)     cervical   . Cataract    . Colon cancer (CMS [place]) 07/06/2014   . COPD (chronic obstructive pulmonary disease) (CMS HCC)    . Fistula    . HTN (hypertension)    . Hyperlipidemia    . Lung mass     with pleural effusion   . Obesity    . Oxygen dependent     2 liters nc    . Sleep apnea    . Ventral hernia          Past Surgical History:  Past Surgical History:   Procedure Laterality Date   . ABSCESS DRAINAGE      . BOWEL RESECTION     . CATARACT EXTRACTION     . HX CERVICAL CONE BIOPSY      . HX CESAREAN SECTION     . HX GASTRIC BYPASS      25 years ago   . [place] (x2)   . HX HYSTERECTOMY      late 1990s   . HX LAP BANDING      2013   . HX LAPAROTOMY      2013 to remove lap band         Family Histroy:  Family Medical History:     Problem Relation (Age of Onset)    Diabetes Mother, Maternal Grandmother, Maternal Grandfather, Father, Sister    Heart Attack Mother    Stroke Father            Social History:  Social History     Socioeconomic History   . Marital status: Married     Spouse name: Not on file   . Number of children: Not on  file   . Years of education: Not on file   . Highest education level: Not on file   Occupational History   . Not on file   Tobacco Use   . Smoking status: Former Research scientist (life sciences)   . Smokeless tobacco: Never Used   . Tobacco comment: quit 10 yrs ago   Vaping Use   . Vaping Use: Never used   Substance and Sexual Activity   . Alcohol use: Not Currently     Comment: rarely   . Drug use: No   . Sexual activity: Not Currently   Other Topics Concern   . Ability to Walk 1 Flight of Steps without SOB/CP No   . Routine Exercise Not Asked   . Ability to Walk 2 Flight of Steps without SOB/CP Not Asked   . Unable to Ambulate Not Asked   . Total Care Not Asked   . Ability To Do Own ADL's Yes   . Uses Walker Not Asked   . Other Activity Level Not Asked   . Uses Cane Not Asked   Social History Narrative   . Not on file     Social Determinants of Health     Financial Resource Strain: Not on file   Food Insecurity: Not on file   Transportation Needs: Not on file   Physical Activity: Not on file   Stress: Not on file   Intimate Partner Violence: Not on file   Housing Stability: Not on file     Allergies:  Allergies   Allergen Reactions   . Shellfish Containing Products Shortness of Breath, Rash and Itching     scallops   . Vancomycin Shortness of Breath   . Barium Iodide    . Iodine And Iodide  Containing Products      Itching, shortness of breath      Medications:  Prior to Admission medications    Medication Sig Start Date End Date Taking? Authorizing Provider   albuterol sulfate (PROVENTIL OR VENTOLIN OR PROAIR) 90 mcg/actuation Inhalation HFA Aerosol Inhaler INHALE 1 TO 2 PUFFS BY MOUTH EVERY 6 HOURS AS NEEDED 09/11/19  Yes Herbie Drape, NP   albuterol sulfate (PROVENTIL) 2.5 mg /3 mL (0.083 %) Inhalation Solution for Nebulization Take 2.5 mg by nebulization Every 6 hours as needed for Wheezing   Yes Provider, Historical   apixaban (ELIQUIS) 5 mg Oral Tablet Take 1 Tab (5 mg total) by mouth Twice daily 03/27/18  Yes Victory Dakin, MD   aspirin (ECOTRIN) 325 mg Oral Tablet, Delayed Release (E.C.) Take 325 mg by mouth Once a day   Yes Provider, Historical   atorvastatin (LIPITOR) 10 mg Oral Tablet Take 10 mg by mouth Once a day   Yes Provider, Historical   BREZTRI AEROSPHERE 160-9-4.8 mcg/actuation Inhalation HFA Aerosol Inhaler INHALE 2 PUFFS BY MOUTH TWICE DAILY 11/30/19  Yes Provider, Historical   carvediloL (COREG) 3.125 mg Oral Tablet Take 3.125 mg by mouth Twice daily with food   Yes Provider, Historical   docusate sodium (COLACE) 100 mg Oral Capsule Take 100 mg by mouth Three times a day as needed for Constipation   Yes Provider, Historical   ferrous sulfate (FEOSOL) 325 mg (65 mg iron) Oral Tablet Take 1 Tablet (325 mg total) by mouth Once a day 08/01/20  Yes Herbie Drape, NP   flecainide (TAMBOCOR) 100 mg Oral Tablet Take 1 Tab (100 mg total) by mouth Twice daily 07/29/19  Yes Jacob Moores, APRN,FNP-BC   levothyroxine (  SYNTHROID) 112 mcg Oral Tablet Take 1 Tablet (112 mcg total) by mouth Every morning 11/26/19  Yes Vanetta Mulders, MD   [facility]) 72 mcg Oral Capsule Take 72 mcg by mouth Every morning   Yes Provider, Historical   lisinopriL (PRINIVIL) 20 mg Oral Tablet Take 20 mg by mouth Once a day   Yes Provider, Historical   loperamide (IMODIUM) 2 mg Oral Capsule Take  2 mg by mouth Every 4 hours as needed (Diarrhea)   Yes Provider, Historical   loratadine (CLARITIN) 10 mg Oral Tablet Take 1 Tablet (10 mg total) by mouth Once a day 10/19/20  Yes Vishwanathan, Swati, MD   magnesium oxide (MAG-OX) 400 mg Oral Tablet Take 400 mg by mouth Once a day   Yes Provider, Historical   omeprazole (PRILOSEC) 20 mg Oral Capsule, Delayed Release(E.C.) Take 40 mg by mouth Once a day    Yes Provider, Historical   oxyCODONE (ROXICODONE) 5 mg Oral Tablet Take 1 Tablet (5 mg total) by mouth Every 6 hours as needed for Pain 12/19/20  Yes Vishwanathan, Swati, MD   potassium chloride (K-DUR) 20 mEq Oral Tab Sust.Rel. Particle/Crystal Take 20 mEq by mouth Every Monday, Wednesday and Friday Takes 3 days a week   Yes Provider, Historical   prochlorperazine (COMPAZINE) 10 mg Oral Tablet Take 1 Tab (10 mg total) by mouth Four times a day as needed for Nausea/Vomiting 12/16/18  Yes Vanetta Mulders, MD   rOPINIRole (REQUIP) 0.5 mg Oral Tablet Take 0.5 mg by mouth Every night   Yes Provider, Historical   albuterol sulfate (PROVENTIL) 2.5 mg /3 mL (0.083 %) Inhalation Solution for Nebulization 3 mL (2.5 mg total) by Nebulization route Every 6 hours 08/09/18 01/09/21  Kathrine Haddock, DO   diphenhydrAMINE (BENADRYL) 50 mg Oral Capsule Take 1 capsule by mouth 1 hour prior to CT scan. 10/20/20 01/09/21  Gala Lewandowsky, MD   oxyCODONE (ROXICODONE) 5 mg Oral Tablet Take 1 Tablet (5 mg total) by mouth Every 6 hours as needed for Pain 10/31/20 12/19/20  Gala Lewandowsky, MD       Current Facility-Administered Medications:   .  acetaminophen (TYLENOL) tablet, 650 mg, Oral, Q4H PRN, Janese Banks, Denisse, MD, 650 mg at 01/11/21 0101  .  albuterol (PROVENTIL) 2.5 mg / 3 mL (0.083%) neb solution, 2.5 mg, Nebulization, Q4H PRN, Cutri, Sage, APRN,NP-C  .  arformoterol (BROVANA) 15 mcg/2 mL nebulizer solution, 15 mcg, Nebulization, 2x/day, Cutri, Sage, APRN,NP-C, 15 mcg at 01/10/21 2055  .  atorvastatin (LIPITOR) tablet, 10 mg,  Oral, QPM, Cutri, Sage, APRN,NP-C, 10 mg at 01/10/21 2102  .  budesonide (PULMICORT RESPULES) 0.5 mg/2 mL nebulizer suspension, 0.5 mg, Nebulization, 2x/day, Cutri, Sage, APRN,NP-C, 0.5 mg at 01/10/21 2055  .  carvedilol (COREG) tablet, 6.25 mg, Oral, 2x/day-Food, [name], MD, 6.25 mg at 01/11/21 0834  .  ceFAZolin (ANCEF) 3 g in D5W 100 mL IVPB, 3 g, Intravenous, Q8H, Thresa Ross, MD PhD, Stopped at 01/11/21 [phone]  .  famotidine (PEPCID) tablet, 20 mg, Oral, 2x/day, Cutri, Sage, APRN,NP-C, 20 mg at 01/11/21 0834  .  ferrous sulfate 340m (60 mg elemental IRON) per 530moral liquid, 60 mg, Oral, EVERY OTHER DAY, Cutri, Sage, APRN,NP-C, 60 mg at 01/11/21 0834  .  flecainide (TAMBOCOR) tablet, 100 mg, Oral, 2x/day, Cutri, Sage, APRN,NP-C, 100 mg at 01/11/21 0835  .  gelatin absorbable (SURGIFOAM) 100cm topical sponge, 1 Each, Apply Topically, INTRA-OP Once PRN, BhDebbora DusMD, 1 Each at 01/10/21  1727  .  gelatin matrix sealant (FLOSEAL) 5 mL topical kit, , Apply Topically, INTRA-OP Once PRN, Debbora Dus, MD  .  hydrALAZINE (APRESOLINE) injection 10 mg, 10 mg, Intravenous, Q4H PRN, Janese Banks, Denisse, MD, 10 mg at 01/11/21 0435  .  levothyroxine (SYNTHROID) tablet, 112 mcg, Oral, QAM, Cutri, Sage, APRN,NP-C, 112 mcg at 01/11/21 2458  .  lidocaine 1%-EPINEPHrine 1:100,000 injection, 15 mL, Injection, INTRA-OP Once PRN, Debbora Dus, MD  .  loperamide (IMODIUM) capsule, 2 mg, Oral, Q4H PRN, Cutri, Sage, APRN,NP-C  .  loratadine (CLARITIN) tablet, 10 mg, Oral, Daily, Cutri, Sage, APRN,NP-C, 10 mg at 01/11/21 0834  .  magnesium hydroxide (MILK OF MAGNESIA) 410m per 597moral liquid, 15 mL, Oral, 3x/day, ArJanese BanksDenisse, MD, 1,200 mg at 01/11/21 0835  .  magnesium oxide (MAG-OX) tablet, 400 mg, Oral, Daily, Cutri, Sage, APRN,NP-C, 400 mg at 01/11/21 0834  .  metoprolol (LOPRESSOR) 1 mg/mL injection, 5 mg, Intravenous, Q4H PRN, Gorby, Faith, APRN,FNP-BC, 5 mg at 01/11/21 0406  .  morphine 2  mg/mL injection, 2 mg, Intravenous, Q4H PRN, TuThresa RossMD PhD  .  NS 15 mL with thrombin (recombinant) (RECOTHROM) 5,000 Units topical solution, , Topical, INTRA-OP Once PRN, BhDebbora DusMD, Given(Other Clinician) at 01/10/21 1727  .  INSERT & MAINTAIN PERIPHERAL IV ACCESS, , , UNTIL DISCONTINUED **AND** PERIPHERAL IV DRESSING CHANGE, , , Q 7 DAYS **AND** NS flush syringe, 2-6 mL, Intracatheter, Q8HRS, 2 mL at 01/11/21 0637 **AND** NS flush syringe, 2-6 mL, Intracatheter, Q1 MIN PRN, ArJanese BanksDenisse, MD  .  NS irrigation, 1,000 mL, Irrigation, INTRA-OP Q1H PRN, BhDebbora DusMD, 1,000 mL at 01/10/21 1700  .  ondansetron (ZOFRAN) 2 mg/mL injection, 4 mg, Intravenous, Q6H PRN, ArJanese BanksDenisse, MD  .  oxyCODONE-acetaminophen (PERCOCET) 5-32569mer tablet, 1 Tablet, Oral, Q4H PRN **OR** oxyCODONE-acetaminophen (PERCOCET) 5-325m48mr tablet, 2 Tablet, Oral, Q4H PRN, TurnThresa Ross PhD, 2 Tablet at 01/11/21 [phone] Pharmacist FYI - KCenEppie Gibsonered, , Does not apply, Daily PRN, ArteJanese Banksnisse, MD  .  polyethylene glycol (MIRALAX) oral packet, 17 g, Oral, Daily, ArteJanese Banksnisse, MD, 17 g at 01/11/21 0834  .  rOPINIRole (REQUIP) tablet, 0.5 mg, Oral, QPM, Cutri, Sage, APRN,NP-C, 0.5 mg at 01/10/21 2104  .  sennosides-docusate sodium (SENOKOT-S) 8.6-50mg per tablet, 1 Tablet, Oral, Daily, ArteArna Snipe, 1 Tablet at 01/11/21 [phone] Review of systems  14 point ROS was performed and is negative except as per HPI  ?  Physical Examination:   Filed Vitals:    01/11/21 0400 01/11/21 0500 01/11/21 0600 01/11/21 0800   BP: (!) 188/87 (!) 185/93 (!) 150/84    Pulse: 86 85 83    Resp: (!) 21 16 (!) 22    Temp:    37.1 C (98.8 F)   SpO2: 96% 94% 92%         Physical Exam  Constitutional:       Appearance: Normal appearance.   HENT:      Head: Normocephalic and atraumatic.      Mouth/Throat:      Mouth: Mucous membranes are moist.   Eyes:      Conjunctiva/sclera: Conjunctivae  normal.   Cardiovascular:      Rate and Rhythm: Normal rate and regular rhythm.      Pulses: Normal pulses.      Heart sounds: Normal heart sounds.   Pulmonary:  Effort: Pulmonary effort is normal.      Breath sounds: Normal breath sounds.   Abdominal:      General: Abdomen is flat. Bowel sounds are normal.      Palpations: Abdomen is soft.   Musculoskeletal:         General: Normal range of motion.   Skin:     General: Skin is warm.   Neurological:      General: No focal deficit present.      Mental Status: She is alert and oriented to person, place, and time.      Comments: Surgical clip with old dried blood   Psychiatric:         Thought Content: Thought content normal.               Labs and Radiology:  CBC Results Differential Results   No results found for this or any previous visit (from the past 30 hour(s)). No results found for this or any previous visit (from the past 30 hour(s)).   BMP Results Other Chemistries Results   Results for orders placed or performed during the hospital encounter of 01/08/21 (from the past 30 hour(s))   BASIC METABOLIC PANEL, NON-FASTING    Collection Time: 01/11/21 12:25 AM   Result Value    SODIUM 138    POTASSIUM 4.3    CHLORIDE 105    CO2 TOTAL 25    GLUCOSE 115    BUN 18    CREATININE 0.71    Recent Results (from the past 30 hour(s))   PHOSPHORUS    Collection Time: 01/11/21 12:25 AM   Result Value    PHOSPHORUS 2.7   MAGNESIUM    Collection Time: 01/11/21 12:25 AM   Result Value    MAGNESIUM 2.2      Liver/Pancreas Enzyme Results Liver Function Results   No results found for this or any previous visit (from the past 30 hour(s)). No results found for this or any previous visit (from the past 30 hour(s)).   Cardiac Results Coags Results   No results found for this or any previous visit (from the past 30 hour(s)). No results found for this or any previous visit (from the past 30 hour(s)).       Coagulation:  Lab Results   Component Value Date    INR 1.19 01/08/2021    INR  1.02 11/21/2020    INR 1.44 (H) 12/11/2019       Anemia Studies:  Lab Results   Component Value Date    IRON 31 (L) 08/01/2020     No components found for: VITAMINB12  Lab Results   Component Value Date    FOLATE 9.9 12/12/2019       Imaging:  CT BRAIN WO IV CONTRAST    Result Date: 01/11/2021  CLINICAL INFORMATION: Chelsei Shearer Female, 70 years old. REASON FOR EXAM:  s/p R frontal approach for ETV IMAGING STUDY: CT BRAIN WO IV CONTRAST performed on 01/10/2021 6:20 PM. RADIATION DOSE: 1203.30 mGycm TECHNIQUE: CT head was performed without contrast. COMPARISON: MRI brain 01/09/2021. CT head 01/10/2021 3:26 AM FINDINGS: Right frontal EVD catheter has been removed. Trace pneumocephalus is present. Trace acute subarachnoid hemorrhage along the right frontal convexity. No acute parenchymal hemorrhage or CT evidence of an acute territorial infarct. Multiple supratentorial masses are better assessed the prior MRI brain. Cerebral volume is normal. Ventricular caliber is unchanged from earlier today. There is no progressive mass effect.     1.Interval  removal of right frontal EVD catheter. 2.Trace right convexity acute subarachnoid hemorrhage. No parenchymal hemorrhage or infarct. 3.Multiple supratentorial masses are better assessed on the prior MRI brain. 4.Unchanged ventricular caliber.     CT BRAIN WO IV CONTRAST    Result Date: 01/09/2021  CLINICAL INFORMATION: Brittny Musick Female, 70 years old. REASON FOR EXAM:  s/p EVD IMAGING STUDY: CT BRAIN WO IV CONTRAST performed on 01/09/2021 2:57 AM. RADIATION DOSE: 1201.10 mGycm TECHNIQUE: CT brain without IV contrast for interpretation COMPARISON: Prior CT brain dated 07/06/2018 FINDINGS:  Interval postsurgical changes of right burr hole and right frontal approach ventriculostomy catheter placement with the tip terminating adjacent to the septum pellucidum in the right ventricle. There is interval decrease in size of the ventriculomegaly when compared to outside CT. Right matter  changes seen in the right frontal lobe likely to represent chronic infarct. There is a 1 cm circumscribed mass seen inferior to the pineal gland and the midbrain (series 10 image 15 and series 3 image 64). There is no acute intracranial hemorrhage or extra-axial fluid collection. There is no CT evidence of an acute territorial infarct. Periventricular hypodensities are most compatible with chronic microvascular disease. The orbits are unremarkable. The paranasal sinuses and mastoid air cells are well-aerated. The calvarium is normal.     1.Interval postsurgical changes from right frontal approach ventriculostomy catheter with interval decrease in size of the lateral ventricles. 2.Redemonstration of a mass inferior to the pineal gland, consider follow-up with MRI for further evaluation 3.Chronic right frontal lobe infarct.     MRI BRAIN W/WO CONTRAST    Result Date: 01/09/2021  [facility] Female, 70 years old. MRI BRAIN W/WO CONTRAST performed on 01/09/2021 4:28 AM. REASON FOR EXAM:  brain tumor INTRAVENOUS CONTRAST: 13 ml's of Gadavist TECHNIQUE: Sagittal T1, axial T2, axial T2-weighted FLAIR, axial T1, axial diffusion, axial gradient-echo blood sensitive imaging was performed. Axial and volumetric postcontrast T1-weighted imaging was performed. MR perfusion imaging was performed. COMPARISON: CT examination the brain performed 01/09/2021. FINDINGS: These images demonstrate multiple enhancing intracranial mass lesions. The most prominent of these involves the midbrain tectum eccentrically located towards the left. There is evidence of hemorrhage in this lesion. This measures approximately 1.1 cm in diameter. This narrows the cerebral aqueduct. The ventricles are more prominent than the overlying cortical sulci and are increased when compared to the CT of 07/06/2018. Second focus of abnormal enhancement within the brain parenchyma is within the right frontal lobe measuring 7 mm in diameter as seen on image 86 series  19. There is a punctate region of abnormal enhancement within the left posterior frontal lobe as seen on image 36 series 20. This measures approximately 3.5 mm. No diffusion abnormalities are appreciated. There is signal dropout on blood sensitive imaging associated with the midbrain lesion. MR perfusion imaging is noncontributory. There are scattered no masslike areas of increased signal on T2-weighted imaging within the supratentorial white matter in a pattern suggestive of mild underlying small vessel ischemic processes.      1. 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 2. The most prominent of these involves the midbrain tectum on the left with evidence of hemorrhage.This measures approximately 1.1 cm in diameter.  This results in increased ventricular prominence when compared to the study of 2019, but unchanged when compared to the study performed earlier on this date. 3. 2 additional lesions are located within the frontal lobes (7 mm on the right and 3.5 mm on  the left).     MRI SPINE CERVICAL W/WO CONTRAST    Result Date: 01/09/2021  [facility] Female, 70 years old. MRI SPINE CERVICAL W/WO CONTRAST performed on 01/09/2021 4:36 AM. REASON FOR EXAM:  brain mass, concern for mets INTRAVENOUS CONTRAST: 13 ml's of Gadavist CREATININE/GFR: 0.81 mg/dL on 01/08/2021 GFR>59 TECHNIQUE: Sagittal T1, T2, short TI inversion recovery, sagittal postcontrast T1, axial T1, T2, gradient-echo T2*, and axial postcontrast T1-weighted imaging was performed. COMPARISON: None FINDINGS: There is straightening of the normal cervical lordosis. This is likely positional in nature. There is loss of intervertebral disc space height at C5-6. Vertebral body and intervertebral disc space heights are otherwise maintained. The cervical cord demonstrates normal course, caliber, and contour. C1: This level is unremarkable in appearance. C2-3: The thecal sac and neural foramen  are patent. C3-4: There are mild facet degenerative changes. The thecal sac and neural foramen are patent. C4-5: The thecal sac and neural foramen are patent. There are mild uncovertebral and facet degenerative changes at this level. C5-6: There is a disc bulge. There are uncovertebral degenerative changes. There is ligamentum flavum and facet hypertrophic change. There is moderate to severe narrowing of the thecal sac, but without evident cord compression or cord edema. There is right neural foraminal stenosis graded as severe. The left neural foramen is graded as moderate. C6-7: The thecal sac and neural foramen are patent. C7-T1: The thecal sac and neural foramen are patent. Postcontrast imaging demonstrates no abnormal enhancement within or about the cervical cord. There is some mild enhancement within the supraspinous ligaments at the C5-6 level likely indicative of degenerative change in this region this is nonmass-like in nature.      1. No findings suggestive of metastatic disease in the cervical spine. 2. Degenerative changes predominantly isolated to the C5-6 level where there is moderate to severe narrowing of the thecal sac, but without evident cord compression or cord edema. This is due to a disc bulge with uncovertebral, ligamentum flavum, and facet hypertrophic changes.    MRI SPINE THORACIC W/WO CONTRAST    Result Date: 01/09/2021  [facility] Female, 70 years old. MRI SPINE THORACIC W/WO CONTRAST performed on 01/09/2021 4:35 AM. REASON FOR EXAM:  brain mass, concern for mets INTRAVENOUS CONTRAST: 13 ml's of Gadavist CREATININE/GFR: 0.81 mg/dL on 01/08/2021 GFR>59 TECHNIQUE: Sagittal T1, T2, short TI inversion recovery, sagittal postcontrast T1, axial T1, and T2-weighted imaging was performed. In total, 245 images are submitted. COMPARISON: None FINDINGS: There is normal alignment of the thoracic spine with maintenance of vertebral body and intervertebral disc space heights. The thoracic cord  demonstrates normal course, caliber, and contour. No abnormal postcontrast enhancement is appreciated. No mass lesions are appreciated.      1. Unremarkable/normal appearing MRI examination of the thoracic spine.    MRI SPINE LUMBOSACRAL W/WO CONTRAST    Result Date: 01/09/2021  [facility] Female, 70 years old. MRI SPINE LUMBOSACRAL W/WO CONTRAST performed on 01/09/2021 4:37 AM. REASON FOR EXAM:  brain mass, concern for mets INTRAVENOUS CONTRAST: 13 ml's of Gadavist CREATININE/GFR: 0.81 mg/dL on 01/08/2021 GFR>59 TECHNIQUE: Sagittal T1, T2, postcontrast T1, sagittal STIR imaging was performed. Sagittal and axial postcontrast T1-weighted imaging was performed. COMPARISON: None FINDINGS: The exception of L4-5, there is normal alignment of the lumbar spine. There is grade 1 anterior listhesis of L4 on L5. There are advanced facet degenerative changes at this level. There is a broad-based pseudodisc bulge. There is severe narrowing of the thecal sac at this level. There  is bilateral mild neural foraminal narrowing at this level.. No metastatic disease to the lumbar spine is appreciated. The conus medullaris terminates posterior to L1-2. There is mild to moderate narrowing of the thecal sac at the L3-4 level. There are severe facet degenerative changes involving the L4-5 and L5-S1 levels.  Postcontrast imaging demonstrates some mild enhancement about the facet joints at L4-5 and L5-S1 in a degenerative type pattern.      1. Severe narrowing of the thecal sac at the L4-5 level secondary to ligamentum flavum and facet hypertrophic change in addition to a pseudodisc bulge. 2. No MRI findings suggestive of metastatic disease to the lumbar spine. 3. Multilevel facet degenerative changes most severe at the L4-5 and L5-S1 levels.    XR AP MOBILE CHEST    Result Date: 01/09/2021  [facility] Female, 70 years old. XR AP MOBILE CHEST performed on 01/09/2021 1:30 PM. REASON FOR EXAM:  preop TECHNIQUE: 1 view/1 image(s) submitted  for interpretation. FINDINGS: Compared to CT of same date. The heart is prominent in size. There is persistent right perihilar consolidation with loculated right pleural effusion. No visible pneumothorax. A right port is present with tip over the lower SVC.    Stable chest examination with right pleural effusion.     CT CHEST ABDOMEN PELVIS W IV CONTRAST    Result Date: 01/09/2021  [facility] [facility] Female, [facility] years old. CT CHEST ABDOMEN PELVIS W IV CONTRAST performed on 01/09/2021 11:00 AM. REASON FOR EXAM:  brain mass ADDITIONAL CLINICAL INFORMATION: 71 year old female with known enhancing brain masses. TECHNIQUE: Axial CT images were obtained through the chest, abdomen, and pelvis following the intravenous administration of contrast. Coronal and sagittal reformatted images were created. RADIATION DOSE: 2232.70 mGycm CONTRAST: 100 ml's of Isovue 370 COMPARISON: MRI examinations of the brain, cervical spine, thoracic spine, and lumbar spine performed Jan 09, 2021. Noncontrast outside CT of the chest, abdomen, and pelvis performed October 27, 2020. FINDINGS: CHEST: LUNGS: Right hilar/perihilar mass-like consolidation likely corresponds to the clinical history of right lower lobe adenocarcinoma status post radiation treatments. Considering the differences in technique, this appears similar to October 27, 2020. An approximately 1.1 x 0.8 cm pleural-based nodule in the lingular segment of the left upper lobe, series 4 image 40, is unchanged from March 2022. Otherwise, no new pulmonary nodules are detected. HEART/MEDIASTINUM: As noted above, there is right hilar/perihilar masslike consolidation which is similar to the previous exam. The heart is normal in size without pericardial effusion. Dense mitral annular calcifications are noted. No filling defects are visible within the central pulmonary arterial tree. The thoracic aorta is of normal caliber. PLEURA: A moderate loculated right pleural effusion is present. LYMPH NODES: No  abnormally enlarged lymph nodes by CT size criteria are detected. A few calcified mediastinal and hilar lymph nodes are unchanged. BONES/SOFT TISSUES: A right-sided medication infusion port is unchanged in position. Diminished bone mineral density is noted. There are multilevel degenerative changes and anterior osteophytosis along the thoracic spine. No acute or destructive bony abnormalities are detected. ABDOMEN/PELVIS: LIVER: Normal in size with uniform enhancement. BILIARY SYSTEM/GALLBLADDER: The gallbladder is normal in size. No intra or extrahepatic bile duct dilation is present. PANCREAS: Fatty replacement changes are seen throughout the pancreas. KIDNEY/URETERS: Mild renal cortical thinning is present. The kidneys demonstrate symmetric enhancement. No solid renal mass or hydronephrosis is present. The ureters are of normal caliber. ADRENALS: The right adrenal gland is within normal limits. Nodular thickening of the left adrenal gland is unchanged from multiple  previous exams. SPLEEN: Within normal limits. 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 Multiple colonic loops are closely approximated to the anterior abdominal wall in association with a broad ventral abdominal wall hernia defect. No abnormally dilated loops of bowel are present to suggest obstruction. VASCULATURE/LYMPH NODES: The abdominal aorta is normal in caliber with moderate atherosclerotic calcifications. The portal, splenic, and superior mesenteric veins are patent. No abnormally enlarged lymph nodes are detected in the chest. BLADDER: Decompressed by an indwelling Foley catheter. REPRODUCTIVE ORGANS: The uterus is surgically absent. No suspicious adnexal masses are detected. PERITONEAL CAVITY: No intraperitoneal free air or free fluid is present. BONES/SOFT TISSUES: Diminished bone mineral density is noted. No acute or destructive bony abnormalities are visible.     1. Spiculated mass-like right  hilar/perihilar consolidation likely corresponds to the known history of right lower lobe adenocarcinoma status post radiation treatment. Overall, this is unchanged in appearance from March 2022. 2. Unchanged 1.1 cm lingular, pleural-based pulmonary nodule as compared to March 2022. 3. Moderate loculated right pleural effusion. 4. Nodular thickening of the left adrenal gland is unchanged compared to multiple previous examinations. 5. Otherwise, no new suspicious masses or adenopathy are detected in the chest, abdomen, or pelvis to suggest new sites of metastatic disease.     CT LAB BRAIN WO IV CONTRAST    Result Date: 01/10/2021  *Procedure not read by radiology. *Please Refer to Procedure Note for result.    ECG 12-LEAD    Result Date: 01/08/2021   Poor data quality, interpretation may be adversely affected Normal sinus rhythm Nonspecific ST and T wave abnormality Abnormal ECG When compared with ECG of 27-Jan-2019 15:29, No significant change was found Confirmed by Arther Abbott MD, CHRISTOPHER ([id number]) on 01/08/2021 9:20:02 PM      Assessment:   Kelsey [name] is a 70 y.o. 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 She underwent ETV right frontal.    Stage IIIB lung adenocarcinoma:   - 04/2018. CT guided lung biopsy showed well-differentiated lung adenocarcinoma. Positive for CK7 and TTF1. Negative for p63 and CK20  - 06/2018: PET CT showed metastatic adenopathy in the right hilum and mediastinum in addition to right lower lobe pulmonary mass  - On carboplatin and paclitaxel 07/23/2018-09/10/2018 along with radiation  - PET-CT 10/2018 showed hypermetabolic right lower lobe mass with abnormal mediastinal and hilar nodes.  - On maintenance durvalumab. From 10/31/2018- 11/07/20  - 02/2019: PET-CT with findings of  progression and pleural effusion  - 06/2020: CT chest shows interval increase in size of pleural effusion. Interval increase in size of a left upper lobe pulmonary nodule, consistent with disease progression  - 08/01/2020: CARIS shows PD-L1 TPS 15%  - 11/21/2020: Pleural fluid negative for malignant cells  - 01/09/21 CT CAP shows spiculated mass like right hilar/perihilar consolidation. Unchanged from 10/2020 and left lingular 1.1cm, pleural based pulmonary nodule unchanged from 10/2020. 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 Most prominent lesion in the midbrain tectum, 1.1cm. 2 additional lesions are located within the frontal lobes  - 01/09/21: MRI whole spine negative for metastatic disease  - EVD and R frontal ETV. Mass cannot be biopsied due to critical location in midbrain.  Stage III colon cancer:  - Sigmoidoscopy in 2015 which showed polyp consistent with adenocarcinoma  - 05/2014: Segmental resection of sigmoid colon. Low grade pT3, N1a, Mx. Mass 4x3x0.5cm. MSS. 0/4 LN involved. Stage IIIB.   - Received adjuvant chemotherapy FOLFOX for 5 treatments and stopped due to poor tolerance. Lost follow up after that  - Repeat tumor markers negative on 01/08/21      Recommendations:  -As per neurosurgery due to critical location of the mass at midbrain, it cannot be biopsied.   - Recommend Rad-onc for brain mets  - Patient will follow at [facility] oncology team  - Thank you for the consult. We will sign off    Case discussed with attending Dr. Glenetta Borg.     Laury Axon, MD  Hematology/Oncology Fellow, PGY-V      I saw and examined the patient. I reviewed Fellow's note. I agree with the findings and plan of care as documented in the note. Any exceptions/additions are edited/noted.    Charna Busman MD  Assistant Professor  Hematology Oncology Section  Internal Medicine Department   [place]

## 2021-01-11 NOTE — Anesthesia Postprocedure Evaluation (Signed)
Anesthesia Post Op Evaluation    Patient: Kelsey [name]  Procedure(s):  VENTRICULOSTOMY ENDOSCOPIC WITH NAVIGATION  CT SCAN    Last Vitals:Temperature: 37.1 C (98.8 F) (01/11/21 0800)  Heart Rate: 83 (01/11/21 0600)  BP (Non-Invasive): (!) 150/84 (01/11/21 0600)  Respiratory Rate: (!) 22 (01/11/21 0600)  SpO2: 92 % (01/11/21 3354)    No complications documented.    Patient is sufficiently recovered from the effects of anesthesia to participate in the evaluation and has returned to their pre-procedure level.  Patient location during evaluation: bedside       Patient participation: complete - patient participated  Level of consciousness: awake    Pain management: adequate  Airway patency: patent    Anesthetic complications: no  Cardiovascular status: stable  Respiratory status: room air  Hydration status: stable  Patient post-procedure temperature: Pt Normothermic   PONV Status: Absent

## 2021-01-12 ENCOUNTER — Other Ambulatory Visit: Payer: Self-pay

## 2021-01-12 ENCOUNTER — Encounter (HOSPITAL_COMMUNITY): Payer: Self-pay | Admitting: Internal Medicine

## 2021-01-12 MED ORDER — ARFORMOTEROL 15 MCG/2 ML SOLUTION FOR NEBULIZATION
INHALATION_SOLUTION | RESPIRATORY_TRACT | Status: AC
Start: 2021-01-12 — End: 2021-01-12
  Filled 2021-01-12: qty 2

## 2021-01-12 MED ORDER — ENOXAPARIN 40 MG/0.4 ML SUBCUTANEOUS SYRINGE
40.0000 mg | INJECTION | SUBCUTANEOUS | Status: DC
Start: 2021-01-12 — End: 2021-01-12

## 2021-01-12 MED ORDER — SENNOSIDES 8.6 MG-DOCUSATE SODIUM 50 MG TABLET
1.0000 | ORAL_TABLET | Freq: Every day | ORAL | 0 refills | Status: AC
Start: 2021-01-13 — End: 2021-01-27
  Filled 2021-01-12: qty 14, 14d supply, fill #0

## 2021-01-12 MED ORDER — OXYCODONE-ACETAMINOPHEN 5 MG-325 MG TABLET
1.0000 | ORAL_TABLET | ORAL | 0 refills | Status: AC | PRN
Start: 2021-01-12 — End: ?

## 2021-01-12 NOTE — Nurses Notes (Signed)
Patient discharged to home with spouse. DC instructions reviewed with pt and spouse, both verbalized understanding. Pt has all personal belongings. To lobby by wheelchair.

## 2021-01-12 NOTE — Progress Notes (Signed)
[facility]  NEUROSURGERY   PROGRESS NOTE      Kelsey [name], Kelsey [name], 70 y.o. female  Date of Admission:  01/08/2021  Date of Service: 01/12/2021  Date of Birth:  [birth year]    Referring Physician:  Henrine Screws, FNP    Chief Complaint: Confusion  Subjective: NAEO. Oriented and alert for overnight exam.     Vital Signs:  Temp (24hrs) Max:37.1 C (21.3 F)      Systolic (08MVH), QIO:962 , Min:138 , XBM:841     Diastolic (32GMW), NUU:72, Min:80, Max:93    Temp  Avg: 36.8 C (98.2 F)  Min: 36.5 C (97.7 F)  Max: 37.1 C (98.8 F)  MAP (Non-Invasive)  Avg: 109.8 mmHG  Min: 103 mmHG  Max: 119 mmHG  Pulse  Avg: 84  Min: 74  Max: 96  Resp  Avg: 18.2  Min: 16  Max: 22  SpO2  Avg: 93.4 %  Min: 92 %  Max: 95 %       Today's Physical Exam:  Neurologic Exam:  A&Ox3  GCS 4 6 5   Fluent speech  Diminished fund of knowledge  Appropriate attention span & concentration  Diminished recent and remote memory  CN 2 PERRL  CN 3 4 6  EOMI  CN 7 Face symmetric  CN 8 Hearing grossly intact  CN 11 shrug symmetric  CN 12 Tongue midline  Muscle Strength 5/5 BUE and BLE  Muscle tone WNL  SILT BUE and BLE  No drift  No hoffman   Plantars downgoing  No clonus  -- Incision C/D/I w/ staples    Current Medications:  acetaminophen (TYLENOL) tablet, 650 mg, Oral, Q4H PRN  albuterol (PROVENTIL) 2.5 mg / 3 mL (0.083%) neb solution, 2.5 mg, Nebulization, Q4H PRN  arformoterol (BROVANA) 15 mcg/2 mL nebulizer solution, 15 mcg, Nebulization, 2x/day  atorvastatin (LIPITOR) tablet, 10 mg, Oral, QPM  budesonide (PULMICORT RESPULES) 0.5 mg/2 mL nebulizer suspension, 0.5 mg, Nebulization, 2x/day  carvedilol (COREG) tablet, 6.25 mg, Oral, 2x/day-Food  famotidine (PEPCID) tablet, 20 mg, Oral, 2x/day  ferrous sulfate 300mg  (60 mg elemental IRON) per 62mL oral liquid, 60 mg, Oral, EVERY OTHER DAY  flecainide (TAMBOCOR) tablet, 100 mg, Oral, 2x/day  hydrALAZINE (APRESOLINE) injection 10 mg, 10 mg, Intravenous, Q4H PRN  levothyroxine (SYNTHROID) tablet, 112  mcg, Oral, QAM  loperamide (IMODIUM) capsule, 2 mg, Oral, Q4H PRN  loratadine (CLARITIN) tablet, 10 mg, Oral, Daily  magnesium hydroxide (MILK OF MAGNESIA) 400mg  per 94mL oral liquid, 15 mL, Oral, 3x/day  magnesium oxide (MAG-OX) tablet, 400 mg, Oral, Daily  metoprolol (LOPRESSOR) 1 mg/mL injection, 5 mg, Intravenous, Q4H PRN  morphine 2 mg/mL injection, 2 mg, Intravenous, Q4H PRN  NS flush syringe, 2-6 mL, Intracatheter, Q8HRS   And  NS flush syringe, 2-6 mL, Intracatheter, Q1 MIN PRN  ondansetron (ZOFRAN) 2 mg/mL injection, 4 mg, Intravenous, Q6H PRN  oxyCODONE-acetaminophen (PERCOCET) 5-325mg  per tablet, 1 Tablet, Oral, Q4H PRN   Or  oxyCODONE-acetaminophen (PERCOCET) 5-325mg  per tablet, 2 Tablet, Oral, Q4H PRN  Pharmacist FYI - [employer] Ordered, , Does not apply, Daily PRN  polyethylene glycol (MIRALAX) oral packet, 17 g, Oral, Daily  rOPINIRole (REQUIP) tablet, 0.5 mg, Oral, QPM  sennosides-docusate sodium (SENOKOT-S) 8.6-50mg  per tablet, 1 Tablet, Oral, Daily      Assessment/ Plan:  Kelsey [name] is a 70 y.o. 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  PPD3/POD2.  -- Imaging:   -- CT brain wo (01/10/21): EVD removed, ventricles stable   -- MRI brain wwo (01/09/21): Intracranial metastatic disease. most prominent of these involves the midbrain tectum on the left with evidence of hemorrhage.This measures approximately 1.1 cm in diameter.  This results in increased ventricular prominence when compared to the study of 2019, but unchanged when compared to the study performed earlier on this date   -- MRI C/T/L Spine (01/09/21): No metastasis disease              -- CT CAP w (01/09/21): Spiculated mass-like right hilar/perihilar consolidation likely corresponds to the known history of right lower lobe adenocarcinoma status  post radiation treatment. Overall, this is unchanged in appearance from March 2022.              -- CT brain wo (outside facility): on synapse, reviewed  -- Pain/spasm control: Tylenol PRN, Percocet PRN, Morphine PRN  -- Diet: Cardiac  -- Bowel regimen, last BM 01/11/21  -- Abx: none  -- Activity: Ambulate as tolerated w/ assist  -- PT/OT: Ordered   -- DVT ppx: SCDs/Venodynes  -- Consults: None  -- Lines/Drains: Foley  -- Wound: Staples  -- Disposition: ongoing    Rockey Situ, MD PhD  PGY-2, Neurosurgery  Pager: [phone]                  I saw and examined the patient.  I reviewed the resident's note.  I agree with the findings and plan of care as documented in the resident's note.  Any exceptions/additions are edited/noted.    Debbora Dus, MD

## 2021-01-12 NOTE — Care Plan (Signed)
01/12/2021  Patient A&Ox4 overnight. PRN pain medication given for head pain. Patient calm and cooperative without issues. Resting between care. Tolerating medications crushed in applesauce. Patient ambulates standby assist. Safety sit at bedside. VSS. Will continue to monitor.  Lurene Robley C. Sabra Heck, RN  01/12/2021, 02:59      Problem: Adult Inpatient Plan of Care  Goal: Plan of Care Review  Outcome: Ongoing (see interventions/notes)  Goal: Patient-Specific Goal (Individualized)  Outcome: Ongoing (see interventions/notes)  Goal: Absence of Hospital-Acquired Illness or Injury  Outcome: Ongoing (see interventions/notes)  Intervention: Identify and Manage Fall Risk  Recent Flowsheet Documentation  Taken 01/12/2021 0218 by Juleen China., RN  Safety Promotion/Fall Prevention: safety round/check completed  Taken 01/12/2021 0028 by Juleen China., RN  Safety Promotion/Fall Prevention: safety round/check completed  Taken 01/11/2021 2238 by Juleen China., RN  Safety Promotion/Fall Prevention: safety round/check completed  Taken 01/11/2021 2044 by Juleen China., RN  Safety Promotion/Fall Prevention: safety round/check completed  Intervention: Prevent and Manage VTE (Venous Thromboembolism) Risk  Recent Flowsheet Documentation  Taken 01/12/2021 0028 by Juleen China., RN  VTE Prevention/Management:   ambulation promoted   dorsiflexion/plantar flexion performed  Taken 01/11/2021 2044 by Juleen China., RN  VTE Prevention/Management:   ambulation promoted   dorsiflexion/plantar flexion performed  Goal: Optimal Comfort and Wellbeing  Outcome: Ongoing (see interventions/notes)  Intervention: Provide Person-Centered Care  Recent Flowsheet Documentation  Taken 01/11/2021 2044 by Juleen China., RN  Trust Relationship/Rapport: care explained  Goal: Rounds/Family Conference  Outcome: Ongoing (see interventions/notes)     Problem: Skin Injury Risk Increased  Goal: Skin Health and Integrity  Outcome: Ongoing (see  interventions/notes)  Intervention: Optimize Skin Protection  Recent Flowsheet Documentation  Taken 01/11/2021 2044 by Juleen China., RN  Pressure Reduction Techniques: frequent weight shift encouraged  Skin Protection:   adhesive use limited   incontinence pads utilized   tubing/devices free from skin contact   transparent dressing maintained     Problem: Fall Injury Risk  Goal: Absence of Fall and Fall-Related Injury  Outcome: Ongoing (see interventions/notes)  Intervention: Promote Injury-Free Environment  Recent Flowsheet Documentation  Taken 01/12/2021 0218 by Juleen China., RN  Safety Promotion/Fall Prevention: safety round/check completed  Taken 01/12/2021 0028 by Juleen China., RN  Safety Promotion/Fall Prevention: safety round/check completed  Taken 01/11/2021 2238 by Juleen China., RN  Safety Promotion/Fall Prevention: safety round/check completed  Taken 01/11/2021 2044 by Juleen China., RN  Safety Promotion/Fall Prevention: safety round/check completed     Problem: Non-violent/Non-Self Destructive Restraints  Goal: Alternative methods tried prior to restraints  Outcome: Ongoing (see interventions/notes)  Goal: Patient free from injury and discomfort  Outcome: Ongoing (see interventions/notes)  Goal: Autonomy maintained at the highest possible level  Outcome: Ongoing (see interventions/notes)  Goal: Need for restraints reassessed per policy  Outcome: Ongoing (see interventions/notes)  Goal: Patient education provided  Outcome: Ongoing (see interventions/notes)  Goal: Problem Interventions  Outcome: Ongoing (see interventions/notes)

## 2021-01-12 NOTE — Care Management Notes (Signed)
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]    Assessment:      01/12/21 1605   Assessment Details   Assessment Type Continued Assessment   Date of Care Management Update 01/12/21   Date of Next DCP Update 01/13/21   Insurance Information/Type   Insurance type Medicare   Medicare Intent to Discharge Documentation   Discharge IMM give to: Patient   Discharge IMM Letter Given Date 01/12/21   Discharge IMM Letter Given Time 6   IMM explained/reviewed with:  Patient;verbalized understanding   Care Management Plan   Discharge Planning Status discharge plan complete   Projected Discharge Date 01/12/21   Discharge plan discussed with: Patient   CM will evaluate for rehabilitation potential yes   Patient choice offered to patient/family yes  [facility])   Form for patient choice reviewed/signed and on chart yes   Facility or Agency Preferences HH: [facility]   Patient aware of possible cost for ambulance transport?  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  Per service, patient medically cleared for D/C. PT/OT recommending home D/C with HH. HH order placed. CCC met with patient at bedside to discuss recommendations, explain benefits, provide list  of [facility] providers with CMS star ratings, and deliver and discuss IMM. All questions/concerns discussed/addressed. patient verbalized understanding. Signed choice form for [facility] and IMM placed on patient chart. [facility] referral sent. Service and nursing updated. AVS updated. Anticipated D/C to home with [facility]. Family to provide transportation.    The patient will continue to be evaluated for developing discharge needs.     Case Manager: Suanne Marker, [place] COORDINATOR  Phone: [phone]

## 2021-01-12 NOTE — Care Plan (Signed)
[facility]  Occupational Therapy Initial Evaluation    Patient Name: Kelsey [name]  Date of Birth: November 28, 1950  Height: Height: 160 cm (5' 3")  Weight: Weight: 130 kg (287 lb 4.2 oz)  Room/Bed: 26/A  Payor: MEDICARE / Plan: MEDICARE PART A AND B / Product Type: Medicare /     Assessment:   Patient tolerated session fairly well, requiring minimal assist with bathing per patient. Patient performed tasks in seated position. Patient has DME at home needed. Recommend home with 24/7 assist initially and home health therapy.      Discharge Needs:   Equipment Recommendation: to be determined    Discharge Disposition: home with 24/7 assistance, home with home health    JUSTIFICATION OF DISCHARGE RECOMMENDATION   Based on current diagnosis, functional performance prior to admission, and current functional performance, this patient requires continued OT services in home with 24/7 assistance, home with home health  in order to achieve significant functional improvements.    Plan:   Current Intervention: ADL retraining, IADL retraining, balance training, bed mobility training, endurance training, strengthening, therapeutic exercise, transfer training    To provide Occupational therapy services 1x/day, minimum of 2x/week, until discharge.       The risks/benefits of therapy have been discussed with the patient/caregiver and he/she is in agreement with the established plan of care.       Subjective & Objective        01/12/21 5465   Therapist Pager   OT Assigned/ Pager # [phone]   Rehab Session   Document Type evaluation   Total OT Minutes: 17   Patient Effort good   General Information   General Observations of Patient Patient sitting in walk in shower with CNA   Pertinent History of Current Functional Problem MD H&P: 70 y.o. 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  PPD3/POD2.   Medical Lines PIV Line;SWAN-GANZ   Respiratory Status room air   Pre Treatment Status   Support Present Pre Treatment  Sitter present   Mutuality/Individual Preferences   Individualized Care Needs OOB with assist x 1, promote self care   Living Environment   Living Environment Comment patient lives with husband in a single story home, ramp to enter.   Functional Level Prior   Prior Functional Level Comment Patient reports she performs self care on her own. Has FWW, BSC, straight cane, scooter.   Pain Assessment   Pretreatment Pain Rating 0/10 - no pain   Posttreatment Pain Rating 0/10 - no pain   Coping/Psychosocial Response Interventions   Plan Of Care Reviewed With patient   Cognitive Assessment/Interventions   Behavior/Mood Observations behavior appropriate to situation, WNL/WFL   Orientation Status oriented x 4   Attention WNL/WFL   Follows Commands WNL   RUE Assessment   RUE Assessment WFL- Within Functional Limits   LUE Assessment   LUE Assessment WFL- Within Functional Limits   Mobility Assessment/Training   Mobility Comment Patient performed sit to stand x 3 within ADL task with standby assist. Ambulatory from shower, hallway, and into bedroom with cues for safety and standby assist. Sat EOB.   ADL Assessment/Intervention   ADL Comments Patient setup to donn underware. Min assist to donn pants, mainly due to catheter. setup to complete upper  body tasks in seated position.   Balance Skill Training   Sitting Balance: Static good balance   Sitting, Dynamic (Balance) good balance   Sit-to-Stand Balance good balance   Standing Balance: Static fair + balance   Standing Balance: Dynamic fair balance   Post Treatment Status   Post Treatment Patient Status Patient sitting on edge of bed;Call light within reach;Telephone within reach   Support Present Programmer, applications present   Financial trader Nurse   Care Plan  Goals   OT Rehab Goals Occupational Therapy Goal;Occupational Therapy Goal 2;Occupational Therapy Goal 3;Occupational Therapy Goal 4   Occupational Therapy Goals   OT Goal, Time to Achieve by discharge   OT Goal, Activity Type functional transfers mod I.   Occupational Therapy Goal 2   OT Goal, Time to Achieve by discharge   OT Goal, Activity Type full body ADLs mod I.   Planned Therapy Interventions, OT Eval   Planned Therapy Interventions ADL retraining;IADL retraining;balance training;bed mobility training;endurance training;strengthening;therapeutic exercise;transfer training   Clinical Impression   Functional Level at Time of Session Patient tolerated session fairly well, requiring minimal assist with bathing per patient. Patient performed tasks in seated position. Patient has DME at home needed. Recommend home with 24/7 assist initially and home health therapy.   Criteria for Skilled Therapeutic Interventions Met (OT) skilled treatment is necessary   Rehab Potential good, to achieve stated therapy goals   Therapy Frequency 1x/day;minimum of 2x/week   Predicted Duration of Therapy until discharge   Anticipated Equipment Needs at Discharge to be determined   Anticipated Discharge Disposition home with 24/7 assistance;home with home health   Evaluation Complexity Justification   Occupational Profile Review Expanded review   Performance Deficits 1-3 deficits   Clinical Decision Making Moderate analytic complexity   Evaluation Complexity Moderate       Therapist:   Elaina Hoops, OT   Pager #: [phone]

## 2021-01-12 NOTE — Care Plan (Signed)
Problem: Adult Inpatient Plan of Care  Goal: Plan of Care Review  Outcome: Adequate for Discharge  Goal: Patient-Specific Goal (Individualized)  Outcome: Adequate for Discharge  Goal: Absence of Hospital-Acquired Illness or Injury  Outcome: Adequate for Discharge  Goal: Optimal Comfort and Wellbeing  Outcome: Adequate for Discharge  Goal: Rounds/Family Conference  Outcome: Adequate for Discharge     Problem: Skin Injury Risk Increased  Goal: Skin Health and Integrity  Outcome: Adequate for Discharge     Problem: Fall Injury Risk  Goal: Absence of Fall and Fall-Related Injury  Outcome: Adequate for Discharge     Problem: Non-violent/Non-Self Destructive Restraints  Goal: Alternative methods tried prior to restraints  Outcome: Adequate for Discharge  Goal: Patient free from injury and discomfort  Outcome: Adequate for Discharge  Goal: Autonomy maintained at the highest possible level  Outcome: Adequate for Discharge  Goal: Need for restraints reassessed per policy  Outcome: Adequate for Discharge  Goal: Patient education provided  Outcome: Adequate for Discharge  Goal: Problem Interventions  Outcome: Adequate for Discharge

## 2021-01-12 NOTE — Nurses Notes (Signed)
Foley catheter removed per order.  1410- per sitter, patient voided 200 ml. Priscille Heidelberg PA-C notified.

## 2021-01-12 NOTE — Discharge Instructions (Signed)
Discharge Recommendations/ Plan:Discharge VN:[facility] with Home Health (code 6)      Resources: Nursing to call report to: [facility]: [phone] or [phone].

## 2021-01-12 NOTE — Nurses Notes (Signed)
Patient suddenly very agitated about wanting to be discharged, stating that she was told that she was being discharged hours ago (by PT/OT). Patient walked out to nurse's station with sitter to state that she was going to leave AMA if she wasn't discharged soon. Patient states she needs to leave because her husband's elderly mother is in the car in the lot with another family member. Patient appears SOB and fatigued and states she needs a wheelchair. Staff assisted patient back to her room. Explained to patient that PT/OT told her that she was OK to go home meaning she does not need to go to inpatient facility not that she had discharge orders. Suggested to patient that she call her husband and have him come in to pt's room with his mother while discharge orders are being obtained. Paged Neurosurgery.   Priscille Heidelberg PA-C returned page and stated that she is rounding with surgeon right now but can have discharge orders in 30 minutes. Updated patient.

## 2021-01-12 NOTE — Care Management Notes (Signed)
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    The patient will continue to be evaluated for developing discharge needs.     Case Manager: Claudius Sis, [place] COORDINATOR  Phone: [phone]

## 2021-01-12 NOTE — Discharge Summary (Signed)
[facility]  DISCHARGE SUMMARY    PATIENT NAME:  Kelsey [name]  MRN:  [id number]  DOB:  05-19-1951    ENCOUNTER DATE:  01/08/2021  INPATIENT ADMISSION DATE: 01/08/2021  DISCHARGE DATE:  01/12/2021    ATTENDING PHYSICIAN: Debbora Dus, MD  SERVICE: NEUROSURGERY 2  PRIMARY CARE PHYSICIAN: [facility]         LAY CAREGIVER:  ,  ,        PRIMARY DISCHARGE DIAGNOSIS: Pineal tumor  Active Hospital Problems    Diagnosis Date Noted   . 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     Active Non-Hospital Problems    Diagnosis Date Noted   . CAP (community acquired pneumonia) 12/10/2019   . Pneumonia 11/30/2019   . Pleural effusion associated with pulmonary infection 10/22/2019   . Dyspnea 03/11/2019   . Hx of echocardiogram 01/27/2019   . Atrial fibrillation with RVR (CMS HCC) 08/29/2018   . Asthma 08/09/2018   . Neutropenic fever (CMS HCC) 08/07/2018   . Malignant neoplasm of lung, unspecified laterality, unspecified part of lung (CMS [place]) 06/26/2018   . Right lower lobe lung mass 03/22/2018   . Colon cancer (CMS [place]) 07/06/2014   . HTN (hypertension)    . Hyperlipidemia    . Arthropathy, unspecified, site unspecified    . COPD (chronic obstructive pulmonary disease) (CMS HCC)    . Ventral hernia         DISCHARGE MEDICATIONS:     Current Discharge Medication List      START taking these medications.      Details   oxyCODONE-acetaminophen 5-325 mg Tablet  Commonly known as: PERCOCET   1 Tablet, Oral, EVERY 4 HOURS PRN  Qty: 42 Tablet  Refills: 0        CONTINUE these medications - NO CHANGES were made during your visit.      Details   apixaban 5 mg Tablet  Commonly known as: ELIQUIS   5 mg, Oral, 2 TIMES DAILY  Refills: 0     aspirin 325 mg Tablet, Delayed Release (E.C.)  Commonly known as: ECOTRIN   325  mg, Oral, DAILY  Refills: 0     atorvastatin 10 mg Tablet  Commonly known as: LIPITOR   10 mg, Oral, DAILY  Refills: 0     Breztri Aerosphere 160-9-4.8 mcg/actuation HFA Aerosol Inhaler  Generic drug: budesonide-glycopyr-formoterol   INHALE 2 PUFFS BY MOUTH TWICE DAILY  Refills: 0     carvediloL 3.125 mg Tablet  Commonly known as: COREG   3.125 mg, Oral, 2 TIMES DAILY WITH FOOD  Refills: 0     docusate sodium 100 mg Capsule  Commonly known as: COLACE   100 mg, Oral, 3 TIMES DAILY PRN  Refills: 0     ferrous sulfate 325 mg (65 mg iron) Tablet  Commonly known as: FEOSOL   325 mg, Oral, DAILY  Qty: 30 Tablet  Refills: 2     flecainide 100 mg Tablet  Commonly known as: TAMBOCOR   100 mg, Oral, 2 TIMES DAILY  Qty: 180 Tab  Refills: 3     levothyroxine 112 mcg Tablet  Commonly known as: SYNTHROID   112 mcg, Oral, EVERY MORNING  Qty: 90 Tablet  Refills: 4  linaCLOtide 72 mcg Capsule  Commonly known as: LINZESS   72 mcg, Oral, EVERY MORNING  Refills: 0     lisinopriL 20 mg Tablet  Commonly known as: PRINIVIL   20 mg, Oral, DAILY  Refills: 0     loperamide 2 mg Capsule  Commonly known as: IMODIUM   2 mg, Oral, EVERY 4 HOURS PRN  Refills: 0     loratadine 10 mg Tablet  Commonly known as: CLARITIN   10 mg, Oral, DAILY  Qty: 90 Tablet  Refills: 0     magnesium oxide 400 mg Tablet  Commonly known as: MAG-OX   400 mg, Oral, DAILY  Refills: 0     omeprazole 20 mg Capsule, Delayed Release(E.C.)  Commonly known as: PRILOSEC   40 mg, Oral, DAILY  Refills: 0     oxyCODONE 5 mg Tablet  Commonly known as: ROXICODONE   5 mg, Oral, EVERY 6 HOURS PRN  Qty: 70 Tablet  Refills: 0     potassium chloride 20 mEq Tab Sust.Rel. Particle/Crystal  Commonly known as: K-DUR   20 mEq, Oral, EVERY MO, WE AND FR, Takes 3 days a week  Refills: 0     prochlorperazine 10 mg Tablet  Commonly known as: Compazine   10 mg, Oral, 4 TIMES DAILY PRN  Qty: 60 Tab  Refills: 5     rOPINIRole 0.5 mg Tablet  Commonly known as: REQUIP   0.5 mg, Oral,  NIGHTLY  Refills: 0        ASK your doctor about these medications.      Details   * albuterol sulfate 2.5 mg /3 mL (0.083 %) Solution for Nebulization  Commonly known as: PROVENTIL  Ask about: Which instructions should I use?   2.5 mg, Nebulization, EVERY 6 HOURS PRN  Refills: 0     * albuterol sulfate 90 mcg/actuation HFA Aerosol Inhaler  Commonly known as: PROVENTIL or VENTOLIN or PROAIR  Ask about: Which instructions should I use?   INHALE 1 TO 2 PUFFS BY MOUTH EVERY 6 HOURS AS NEEDED  Qty: 25.5 g  Refills: 0         * This list has 2 medication(s) that are the same as other medications prescribed for you. Read the directions carefully, and ask your doctor or other care provider to review them with you.              Discharge med list refreshed?  YES                     ALLERGIES:  Allergies   Allergen Reactions   . Shellfish Containing Products Shortness of Breath, Rash and Itching     scallops   . Vancomycin Shortness of Breath   . Barium Iodide    . Iodine And Iodide Containing Products      Itching, shortness of breath              HOSPITAL PROCEDURE(S):   Bedside Procedures:  Orders Placed This Encounter   Procedures   . BEDSIDE  VENTRIC DRAIN INSERTION/REMOVAL     Surgical   Surgical/Procedural Cases on this Admission     Case IDs Date Procedure Surgeon Location Status    [id number] 01/10/21 VENTRICULOSTOMY ENDOSCOPIC WITH NAVIGATIONCT SCAN Debbora Dus, MD [facility]     BRIEF HPI:    Per admission HPI:  "  Kelsey [name] is a 70 y.o., White female with PMH of A-fib and known malignancy (lung, cervical, colon) on Eliquis who presented to outside facility with emory changes. She underwent CT brain that showed a brain mass along the pineal region. Patient is unable to provide a detailed history, attempted to reach husband over the phone with no success. Reportedly, last took Eliquis today."     [place]:   Admitted 5/22 due to  pineal region mass with hydrocephalus and hadEVD placement. Then on 5/24, she underwent ETV for hydrocephalus. While inpatient, she was evaluated by Heme-Onc for stage IIIB lung adenocarcinoma and stage III colon cancer, who recommended evaluation by Rad-Onc and for patient to follow with her Oncologist at [facility]. The rest of her hospitalization was uneventful    The patient was determined safe for discharge on 5/26.  Prior to discharge the patient was tolerating a diet, her pain was well controlled on oral pain medications, and she was ambulating well and physical therapy felt she was safe for discharge home with home health .  The patient will follow up with Neurosurgery in 2 weeks for reevaluation.  All questions were answered prior to discharge and the patient agreed to be discharged at this time. The patient was instructed to follow up sooner for new or concerning symptoms.         TRANSITION/POST DISCHARGE CARE/PENDING TESTS/REFERRALS:   -- Follow up with your Primary care doctor within 1 week of discharge  -- Follow up in neurosurgery clinic in 2 weeks   -- Please hold Aspirin for 1 week (until 5/30   -- Please hold Eliquis until seen at follow up  -- Follow up with your Oncologist   -- Referral to Radiation-Oncology placed    CONDITION ON DISCHARGE:  A. Ambulation: Full ambulation  B. Self-care Ability: Complete  C. Cognitive Status Alert and Oriented x 3  D. Code status at discharge:             LINES/DRAINS/WOUNDS AT DISCHARGE:   Patient Lines/Drains/Airways Status     Active Line / Dialysis Catheter / Dialysis Graft / Drain / Airway / Wound     Name Placement date Placement time Site Days    Peripheral IV Right Basilic  (medial side of arm) 01/10/21  --  -- 2    Implantable Port Right Chest 09/25/18  1100  -- 840    Wound (Non-Surgical) Right Back 05/06/18  1335  -- 982    Wound (Non-Surgical) Coccyx 01/08/21  2030  -- 3    Surgical Incision Right;Other (Comment) Head 01/10/21  --  -- 2                 DISCHARGE DISPOSITION:  Home discharge    NEUROSURGERY RISK FACTORS:  Pineal region tumor  Hydrocephalus s/p ETV          DISCHARGE INSTRUCTIONS:       Refer to [facility]   Referral Type: Home Health   Requested Specialty: [place]   Number of Visits Requested: 999                Ayub Kirsh Janese Banks, MD    Copies sent to Care Team       Relationship Specialty Notifications Start End    Center, [place] PCP - General EXTERNAL  10/01/17     Phone: [phone] Fax: [phone]  [address] [place] [address]    Vanetta Mulders, [facility]  12/19/18     Phone: [phone] Fax: [phone]         [address] [place] [address]    Newman Nip, RN    12/19/18     Johnanna Schneiders, MD  RADIATION ONCOLOGY  12/19/18     Phone: [phone] Fax: [phone]         [place] [address]    Watkins, Garfield, NP  NURSE PRACTITIONER  12/19/18     Phone: [phone] Fax: [phone]         [place] [facility] [address]    Phineas Inches, FNP-BC  HEMATOLOGY-ONCOLOGY  12/19/18     Phone: [phone] Fax: [phone]         [place] [address]    Hansen, [place], [place]la VASCULAR SURGERY  12/19/18     Phone: [phone] Fax: [phone]         [place] [address]            Referring providers can utilize [email or web address] to access their referred [place] patient's information.

## 2021-01-12 NOTE — Care Plan (Signed)
[facility]  Physical Therapy Initial Evaluation    Patient Name: Kelsey [name]  Date of Birth: February 06, 1951  Height: Height: 160 cm (5\' 3" )  Weight: Weight: 130 kg (287 lb 4.2 oz)  Room/Bed: 26/A  Payor: MEDICARE / Plan: MEDICARE PART A AND B / Product Type: Medicare /     Assessment:      [name] was evaluated by PT services to assess for post d/c rehab needs and home safety as patient is now POD 2 s/p EVD and now R frontal ETV secondary to hydrocephalus. The patient complete all mobility tasks safely and with SBAx1 to CGAx1. Once medically stable, the patient is most approrpaite for home d/c with HHPT services.    Discharge Needs:   Equipment Recommendation: none anticipated  Discharge Disposition: home with home health    JUSTIFICATION OF DISCHARGE RECOMMENDATION   Based on current diagnosis, functional performance prior to admission, and current functional performance, this patient requires continued PT services in home with home health in order to achieve significant functional improvements in these deficit areas: aerobic capacity/endurance, arousal, attention, and cognition, ergonomics and body mechanics, gait, locomotion, and balance.    Plan:   Current Intervention: balance training, bed mobility training, gait training, transfer training  To provide physical therapy services 1x/day, minimum of 2x/week  for duration of until discharge.    The risks/benefits of therapy have been discussed with the patient/caregiver and he/she is in agreement with the established plan of care.       Subjective & Objective        01/12/21 0951   Therapist Pager   PT Assigned/ Pager # York Cerise [phone]   Rehab Session   Document Type evaluation   Total PT Minutes: 17  (12 mins bedside, 5 min chart review/coordination of care)   Patient Effort good   General Information   Patient Profile Reviewed yes   Pertinent History of Current Functional Problem MD H&P: 70 y.o. 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  PPD3/POD2.   Medical Lines PIV Line;SWAN-GANZ   Respiratory Status room air   Mutuality/Individual Preferences   Individualized Care Needs OOB with assist of 1   Living Environment   Lives With spouse   Living Arrangements house   Home Accessibility ramps present at home   [place] patient lives with her husband in in a single story home with ramp access.   Functional Level Prior   Ambulation 1 - assistive equipment   Transferring 1 - assistive equipment   Toileting 2 - assistive person   Bathing 3 - assistive equipment and person   Prior Functional Level Comment mod(I) with occassional assistance of husband. pt has FWW, BSC, straight cane, and scooter.   Pre Treatment Status   Pre Treatment Patient Status   (patient sitting in shower room on shower chair with sitter present)   Support Present Pre Treatment  Sitter present   Communication Pre Treatment  Nurse   Cognitive Assessment/Interventions   Behavior/Mood Observations behavior appropriate to situation, WNL/WFL   Orientation Status oriented x 4   Attention WNL/WFL   Follows Commands WNL   Pain Assessment   Pretreatment Pain Rating 0/10 - no pain   Posttreatment Pain Rating 0/10 - no pain  Transfer Assessment/Treatment   Transfer Comment sit<>stand from shower bench x3 and EOB x1 with SBAx1 and no use of AD.   Gait Assessment/Treatment   Comment the patient ambulated 70'x1 with SBAx1 and no use of AD. the patient ambualted with decreased gait speed and wide BOS.   Balance Skill Training   Comment no LOB noted during session   Sitting Balance: Static good balance   Sitting, Dynamic (Balance) good balance   Sit-to-Stand Balance good balance   Standing Balance: Static fair + balance   Standing Balance: Dynamic fair balance   Post  Treatment Status   Post Treatment Patient Status Patient sitting on edge of bed   Support Present Post Treatment  Sitter present   Communication Post Treatement Nurse   Plan of Care Review   Plan Of Care Reviewed With patient   Physical Therapy Clinical Impression   Assessment [name] was evaluated by PT services to assess for post d/c rehab needs and home safety as patient is now POD 2 s/p EVD and now R frontal ETV secondary to hydrocephalus. The patient complete all mobility tasks safely and with SBAx1 to CGAx1. Once medically stable, the patient is most approrpaite for home d/c with HHPT services.   Criteria for Skilled Therapeutic yes;meets criteria   Pathology/Pathophysiology Noted neuromuscular;musculoskeletal   Impairments Found (describe specific impairments) aerobic capacity/endurance;arousal, attention, and cognition;ergonomics and body mechanics;gait, locomotion, and balance   Functional Limitations in Following  self-care;home management;community/leisure   Rehab Potential good, to achieve stated therapy goals   Therapy Frequency 1x/day;minimum of 2x/week   Predicted Duration of Therapy Intervention (days/wks) until discharge   Anticipated Equipment Needs at Discharge (PT) none anticipated   Anticipated Discharge Disposition home with home health   Care Plan Goals   PT Rehab Goals Bed Mobility Goal;Gait Training Goal;Transfer Training Goal   Bed Mobility Goal   Bed Mobility Goal, Date Established 01/12/21   Bed Mobility Goal, Time to Achieve by discharge   Bed Mobility Goal, Activity Type all bed mobility activities   Bed Mobility Goal, Independence Level modified independence   Gait Training  Goal, Distance to Achieve   Gait Training  Goal, Date Established 01/12/21   Gait Training  Goal, Time to Achieve by discharge   Gait Training  Goal, Independence Level modified independence   Gait Training  Goal, Assist Device least restricted assistive device   Gait Training  Goal, Distance to Achieve 150'    Transfer Training Goal   Transfer Training Goal, Date Established 01/12/21   Transfer Training Goal, Time to Achieve by discharge   Transfer Training Goal, Activity Type all transfers   Transfer Training Goal, Independence Level modified independence   Transfer Training Goal, Assist Device least restrictrictive assistive device   Planned Therapy Interventions, PT Eval   Planned Therapy Interventions (PT) balance training;bed mobility training;gait training;transfer training     Therapist:   Jerrye Bushy, PT, DPT  Pager #: [phone]

## 2021-01-12 NOTE — Consults (Signed)
Radiation Oncology Inpatient Consult    ENCOUNTER DATE: 01/08/2021    PATIENT IDENTIFICATION  Kelsey [name]  MRN #[id number]    Referring:  Dr. 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)     07/23/2018 - 09/10/2018 Chemotherapy Regimen    Plan: PACLITAXEL / CARBOPLATIN WITH RADIATION (LUNG) Start Date: 07/23/2018          Chemotherapy Regimen    Plan:   DURVALUMAB Start Date:   10/31/2018           10/31/2018 -  Chemotherapy Regimen    Plan: DURVALUMAB Start Date: 10/31/2018           Chief Complaint:  The patient presents with clinical diagnosis of brain mets status post resection    HPI:  70 y.o. female seen for consultation for cancer as described above.  Initially, she presented to outside hospital with confusion sometimes CT brain demonstrating concern for pineal mass with associated edema.  This prompted transfer to [facility] for neurosurgical evaluation and EVD placement.  The patient underwent brain MRI which demonstrated contrast enhancing mass of the brainstem/tectum and 2 associated frontal lobe masses.  The patient was then taken to the OR for endoscopic 3rd ventriculostomy by Dr. [name] 01/10/21.  Given the contrast enhancing lesion and prior diagnosis of malignancy that was concerning for brain metastases and Radiation Oncology was consulted for consideration of adjuvant radiation/gamma knife radiosurgery.    On interview today, the patient was resting comfortably in bed with no acute distress or complaints.  She denied pain or other focal neurologic deficits.  Postoperatively, the patient was transferred to the neuro ICU. No family was at  bedside at the time of consult.  Due to difficulty with mobility following the patient had bedside nursing sitter.     Prior Radiation:    Y  Prior Chemotherapy:   Y  Pacemaker:  N  H/o Collagen Vascular Disease, Inflammatory Bowel disease:  N    Patient Active Problem List   Diagnosis   . HTN (hypertension)   . Hyperlipidemia   . Arthropathy, unspecified, site unspecified   . COPD (chronic obstructive pulmonary disease) (CMS [facility])   . Ventral hernia   . Colon cancer (CMS [facility])   . Right lower lobe lung mass   . Malignant neoplasm of lung, unspecified laterality, unspecified part of lung (CMS [facility])   . Neutropenic fever (CMS HCC)   . Asthma   . Atrial fibrillation with RVR (CMS [facility])   . Hx of echocardiogram   . Dyspnea   . Pleural effusion associated with pulmonary infection   . Pneumonia   . CAP (community acquired pneumonia)   . 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 Morbid obesity with BMI of 45.0-49.9, adult (CMS [facility])     Past Medical History:   Diagnosis Date   . A-fib (CMS HCC)    . Arthropathy, unspecified, site unspecified    . Asthma    . Cancer (CMS HCC)     cervical   . Cataract    .  Colon cancer (CMS [place]) 07/06/2014   . COPD (chronic obstructive pulmonary disease) (CMS HCC)    . Fistula    . HTN (hypertension)    . Hyperlipidemia    . Lung mass     with pleural effusion   . Obesity    . Oxygen dependent     2 liters nc    . Sleep apnea    . Ventral hernia      Past Surgical History:   Procedure Laterality Date   . ABSCESS DRAINAGE     . BOWEL RESECTION     . CATARACT EXTRACTION     . HX CERVICAL CONE BIOPSY      . HX CESAREAN SECTION     . HX GASTRIC BYPASS      25 years ago   . [place] (x2)   . HX HYSTERECTOMY      late 1990s   . HX LAP BANDING      2013   . HX LAPAROTOMY      2013 to remove lap band     Family Medical History:     Problem Relation (Age of Onset)    Diabetes Mother, Maternal Grandmother, Maternal Grandfather, Father, Sister    Heart Attack Mother     Stroke Father        Social History     Substance and Sexual Activity   Sexual Activity Not Currently     Social History     Socioeconomic History   . Marital status: Married   Tobacco Use   . Smoking status: Former Research scientist (life sciences)   . Smokeless tobacco: Never Used   . Tobacco comment: quit 10 yrs ago   Vaping Use   . Vaping Use: Never used   Substance and Sexual Activity   . Alcohol use: Not Currently     Comment: rarely   . Drug use: No   . Sexual activity: Not Currently   Other Topics Concern   . Ability to Walk 1 Flight of Steps without SOB/CP No   . Ability To Do Own ADL's Yes     Allergies:    Allergies   Allergen Reactions   . Shellfish Containing Products Shortness of Breath, Rash and Itching     scallops   . Vancomycin Shortness of Breath   . Barium Iodide    . Iodine And Iodide Containing Products      Itching, shortness of breath      Meds:    Outpatient Medications Marked as Taking for the 01/08/21 encounter [facility] Encounter)   Medication Sig   . albuterol sulfate (PROVENTIL OR VENTOLIN OR PROAIR) 90 mcg/actuation Inhalation HFA Aerosol Inhaler INHALE 1 TO 2 PUFFS BY MOUTH EVERY 6 HOURS AS NEEDED   . albuterol sulfate (PROVENTIL) 2.5 mg /3 mL (0.083 %) Inhalation Solution for Nebulization Take 2.5 mg by nebulization Every 6 hours as needed for Wheezing   . apixaban (ELIQUIS) 5 mg Oral Tablet Take 1 Tab (5 mg total) by mouth Twice daily   . aspirin (ECOTRIN) 325 mg Oral Tablet, Delayed Release (E.C.) Take 325 mg by mouth Once a day   . atorvastatin (LIPITOR) 10 mg Oral Tablet Take 10 mg by mouth Once a day   . BREZTRI AEROSPHERE 160-9-4.8 mcg/actuation Inhalation HFA Aerosol Inhaler INHALE 2 PUFFS BY MOUTH TWICE DAILY   . carvediloL (COREG) 3.125 mg Oral Tablet Take 3.125 mg by mouth Twice daily with  food   . docusate sodium (COLACE) 100 mg Oral Capsule Take 100 mg by mouth Three times a day as needed for Constipation   . ferrous sulfate (FEOSOL) 325 mg (65 mg iron) Oral Tablet Take 1 Tablet (325 mg total) by  mouth Once a day   . flecainide (TAMBOCOR) 100 mg Oral Tablet Take 1 Tab (100 mg total) by mouth Twice daily   . levothyroxine (SYNTHROID) 112 mcg Oral Tablet Take 1 Tablet (112 mcg total) by mouth Every morning   . linaCLOtide (LINZESS) 72 mcg Oral Capsule Take 72 mcg by mouth Every morning   . lisinopriL (PRINIVIL) 20 mg Oral Tablet Take 20 mg by mouth Once a day   . loperamide (IMODIUM) 2 mg Oral Capsule Take 2 mg by mouth Every 4 hours as needed (Diarrhea)   . loratadine (CLARITIN) 10 mg Oral Tablet Take 1 Tablet (10 mg total) by mouth Once a day   . magnesium oxide (MAG-OX) 400 mg Oral Tablet Take 400 mg by mouth Once a day   . omeprazole (PRILOSEC) 20 mg Oral Capsule, Delayed Release(E.C.) Take 40 mg by mouth Once a day    . oxyCODONE (ROXICODONE) 5 mg Oral Tablet Take 1 Tablet (5 mg total) by mouth Every 6 hours as needed for Pain   . oxyCODONE-acetaminophen (PERCOCET) 5-325 mg Oral Tablet Take 1 Tablet by mouth Every 4 hours as needed   . potassium chloride (K-DUR) 20 mEq Oral Tab Sust.Rel. Particle/Crystal Take 20 mEq by mouth Every Monday, Wednesday and Friday Takes 3 days a week   . prochlorperazine (COMPAZINE) 10 mg Oral Tablet Take 1 Tab (10 mg total) by mouth Four times a day as needed for Nausea/Vomiting   . rOPINIRole (REQUIP) 0.5 mg Oral Tablet Take 0.5 mg by mouth Every night   . [START ON 01/13/2021] sennosides-docusate sodium (SENOKOT-S) 8.6-50 mg Oral Tablet Take 1 Tablet by mouth Once a day for 14 days     ROS:  Negative apart from HPI    PE:    Temperature: 36.7 C (98.1 F)  Heart Rate: 79  BP (Non-Invasive): (!) 149/94  Respiratory Rate: 18  SpO2: 93 %  Weight: 130 kg (287 lb 4.2 oz)    KPS: 70% - caring for self, not capable of normal activity or work  Psychiatric: Pleasant, Normal affect, behavior, memory, thought content, judgement, and speech.  Gen: WN, communicative, in no distress, resting comfortably  HEENT:  PERRL, EOMI, Oorpharynx clear, good dentition, well-healing surgical incision  with partially trimmed/shaved hair  Cardiac:  RRR, No MRG  Respiratory:  Wearing nasal cannula, not in apparent respiratory distress  Abd: nl BS, NTND, No rebound or guarding  Extr: no LE edema; no calf tenderness  Spine: no spinal tenderness  Neuro: CN 2-12 grossly intact, alert and oriented    LABs & RADs: Pertinent labs & imaging were reviewed.  See HPI.     Impression:  The patient is a 70 y.o. female with multiple contrast-enhancing lesions concerning for brain metastasis given prior diagnosis of malignancy as above.  Status post endoscopic 3rd ventriculostomy with Dr.Bhatia 5/24.     Plan:   Case was discussed with our [facility] radiation oncologist, Dr. Jackqulyn Livings, who asked [place] to evaluate the patient in case of emergent RT. Per Dr. Jackqulyn Livings he appears to be a good candidate for radiosurgery pending final pathologic confirmation of metastatic disease.  The patient will be scheduled for outpatient follow-up with Dr. Jackqulyn Livings following neurosurgical clearance.  The rationale for radiation therapy, its potential risks, benefits, short and long-term side effects, and complications, as well as alternative to radiation therapy were discussed in detail with the patient at the time of this consultation.  All questions were addressed.  No guarantees of safety or efficacy were made.      Thank you for the opportunity to participate in the care of this patient.     Verl Blalock, MD PGY-4      -----------------------------------------------------------------------------------------------------------------------------------------  I saw and examined the patient.  I reviewed the resident's note.  I agree with the findings and plan of care as documented in the resident's note.  Any exceptions/additions are edited/noted.    I spent greater than 50% of a 45 minute visit in discussion of the patient's diagnosis and management, review of historical information, examination, documentation and post-visit activities.     Doylene Canard, MD   Assistant Professor  [place]

## 2021-01-12 NOTE — Care Management Notes (Signed)
Referral Information  ++++++ Placed Provider #1 ++++++  Case Manager: [facility]  Provider Type: Home Health  Provider Name: [facility]- Orene Desanctis, Katharine Look, Hoy Register and [place]  Address:  [address]  [place],  [address]  Contact: peggy carr    Phone: [phone] x  Fax:   Fax: [phone]

## 2021-01-13 ENCOUNTER — Ambulatory Visit (INDEPENDENT_AMBULATORY_CARE_PROVIDER_SITE_OTHER): Payer: Self-pay | Admitting: Anesthesiology

## 2021-01-13 ENCOUNTER — Encounter (HOSPITAL_COMMUNITY): Payer: Self-pay | Admitting: Internal Medicine

## 2021-01-13 ENCOUNTER — Other Ambulatory Visit (HOSPITAL_BASED_OUTPATIENT_CLINIC_OR_DEPARTMENT_OTHER): Payer: Self-pay | Admitting: Anesthesiology

## 2021-01-13 ENCOUNTER — Non-Acute Institutional Stay: Payer: Medicare Hospice

## 2021-01-13 ENCOUNTER — Other Ambulatory Visit: Payer: Self-pay

## 2021-01-13 LAB — CSF CULTURE WITH GRAM STAIN: CSF CULTURE: NO GROWTH

## 2021-01-13 NOTE — Case Communication (Signed)
Home Health for SN, PT, OT                OK for Start of care next week    [facility] and Medicaid     0 financial responsibility    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 on 05/24 (endoscopic ventriculostomy)    Allergies:  Shellfish, Vancomycin, Barium Iodide, Iodine    History:  HTN, HLD, AFIB, COPD right lower lung mass, pleural effusion, obstructive hydrocepha lus secondary to tectal tumor, colon cancer, morbid obesity    Patient has been COVID vaccinated but no booster    PCP Maretta Bees    Patient lives with spouse who will assist with care    WBAT   patient has a hospital bed, FWW, BSC, and electric scooter    HCS Blakeleigh Domek (spouse)    Please do not take Aspirin, you may continue taking it after 01/16/21     Please do not take Eliquis until seen at follow up in neurosurgery clinic    You  may shower, do not immerse incision sites in water such as bath or swimming. If you have a bandage, it may be removed 1 day after you are discharged. Leave incision site open to air, keep clean. No lifting greater than 10 pounds for 2 weeks. No driving while taking narcotic pain medications. Call neurosurgery department or go to emergency room for signs of incisional infection (fevers, chills, incisional opening, drainage, or redness a nd swelling around incisions), intractable nausea and vomiting, or any other concerns.     Okay to resume aspirin at 1 week post-op. Hold Eliquis until follow-up    Coccyx wound--moisture related--border dressing    albuterol sulfate 90 mcg/actuation HFA Aerosol Inhaler, INHALE 1 TO 2 PUFFS BY MOUTH EVERY 6 HOURS AS NEEDED    albuterol sulfate 2.5 mg Nebulization EVERY 6 HOURS PRN      atorvastatin calcium 10 mg Oral DAILY    budesonide /glycopyr/formoterol 160-9-4.8 mcg/actuation INHALE 2 PUFFS BY MOUTH TWICE DAILY    carvedilol 3.125 mg Oral 2 TIMES DAILY WITH FOOD    docusate sodium 100 mg Oral 3 TIMES DAILY  PRN    ferrous sulfate 325 mg Oral DAILY    flecainide acetate 100 mg Oral 2 TIMES DAILY    levothyroxine sodium 112 mcg Oral EVERY MORNING    linaclotide 72 mcg Oral EVERY MORNING    lisinopril 20 mg Oral DAILY    loperamide HCl 2 mg Oral EVERY 4 HOURS PRN    l oratadine 10 mg Oral DAILY    magnesium oxide 400 mg Oral DAILY    omeprazole 40 mg Oral DAILY    oxycodone HCl/acetaminophen 5-325 mg 1 Tablet Oral EVERY 4 HOURS PRN    potassium chloride 20 mEq 20 mEq Oral EVERY MO, WE AND FR, Takes 3 days a week    prochlorperazine maleate 10 mg Oral 4 TIMES DAILY PRN    ropinirole HCl 0.5 mg Oral NIGHTLY    sennosides/docusate sodium 8.6-50 mg 1 Tablet Oral DAILY

## 2021-01-13 NOTE — Telephone Encounter (Signed)
01/13/21:    Lm on vm advising of imaging and STAPLE removal appt, date, times, and locations.  I provided call back number, option, hours and available MYCHART.    JLM, CMA-Neurosurgery

## 2021-01-14 LAB — CSF CULTURE WITH GRAM STAIN: GRAM STAIN: NONE SEEN

## 2021-01-17 ENCOUNTER — Other Ambulatory Visit (HOSPITAL_COMMUNITY): Payer: Self-pay | Admitting: Internal Medicine

## 2021-01-17 ENCOUNTER — Telehealth (HOSPITAL_COMMUNITY): Payer: Self-pay | Admitting: Internal Medicine

## 2021-01-17 DIAGNOSIS — C3491 Malignant neoplasm of unspecified part of right bronchus or lung: Secondary | ICD-10-CM

## 2021-01-17 NOTE — Telephone Encounter (Signed)
How soon will patient need to be scheduled with you.  Please advise

## 2021-01-18 ENCOUNTER — Other Ambulatory Visit: Payer: Self-pay

## 2021-01-18 ENCOUNTER — Inpatient Hospital Stay (HOSPITAL_COMMUNITY)
Admission: RE | Admit: 2021-01-18 | Discharge: 2021-01-18 | Disposition: A | Discharge status: Still In Hospital | Payer: Medicare Other | Source: Ambulatory Visit | Attending: Radiation Oncology | Admitting: Radiation Oncology

## 2021-01-18 DIAGNOSIS — Z538 Procedure and treatment not carried out for other reasons: Secondary | ICD-10-CM | POA: Insufficient documentation

## 2021-01-18 NOTE — Nurses Notes (Signed)
Pt in room 4 w/family x 1 for Reconsult for Brain. Jimmye Norman, RN    CT/Sim per [employer]. Apt scheduled at front desk. Consent signed, witnessed and at front desk to be scanned. Education complete, pt and family x 1 verbalized understanding.     Jimmye Norman, RN

## 2021-01-19 ENCOUNTER — Ambulatory Visit (HOSPITAL_COMMUNITY): Payer: Medicare Other | Admitting: Radiation Oncology

## 2021-01-19 ENCOUNTER — Telehealth (HOSPITAL_COMMUNITY): Payer: Self-pay | Admitting: Internal Medicine

## 2021-01-19 ENCOUNTER — Telehealth (HOSPITAL_COMMUNITY): Payer: Self-pay

## 2021-01-19 NOTE — Progress Notes (Signed)
MULTIDISCIPLINARY BRAIN TUMOR BOARD DISCUSSION:    PATIENT:   Kelsey [name] NUMBER:   [id number]  DOB:    1950-09-27  AGE:   70 y.o.  DATE:   01/19/2021    REFERRING PROVIDER: No ref. provider found  PCP: [place]    PRESENTER: Debbora Dus, MD  TYPE OF PRESENTATION: Prospective  PATHOLOGY REVIEWED?:  No  RADIOGRAPHS REVIEWED?: Yes  NATIONAL GUIDELINES DISCUSSED?: Yes; NCCN    DIAGNOSIS: Metastatic lung cancer; Hx of cervical cancer and colon cancer  STAGE: Clinical Stage IV T3, N2, M1    FAMILY HISTORY?: Not Significant  GENETIC TESTING?: No  PROGNOSTIC INDICATORS DISCUSSED:  Yes, comorbidities    CLINICAL TRIAL PARTICIPATION: No  ELIGIBLE CLINICAL TRIALS: No    NEED FOR PALLIATIVE CARE?: No  PSYCHOSOCIAL CONCERNS?: No  REHABILITATION CONCERNS?: No   NUTRITIONAL CONCERNS?:  No     PATIENT NAVIGATOR DISCUSSED OUTREACH ACTIVITIES/PROGRAMS?:  No  PATIENT NAVIGATOR PROVIDED EDUCATIONAL MATERIAL TO PATIENT?:  No    THE PATIENT'S MOST RECENT CLINICAL INFORMATION, IMAGING, AND PATHOLOGY WAS DISCUSSED TODAY AT [facility] MULTIDISCIPLINARY BRAIN TUMOR BOARD BY THE MEDICAL ONCOLOGY, RADIATION ONCOLOGY, SURGERY, RADIOLOGY, PATHOLOGY, SOCIAL SERVICES, REHAB, AND NURSING SERVICES.    PRELIMINARY RECOMMENDATIONS BASED ON TODAY'S TUMOR BOARD DISCUSSION:   -GKRS    York Pellant 01/19/2021 09:15  Oncology Tumor Board Coordinator

## 2021-01-19 NOTE — Telephone Encounter (Signed)
I contacted patient and spoke to spouse, Kelsey [name], regarding Gamma Knife Radiosurgery video consult scheduled today (01/19/2021 @ 11:00am). Patient's spouse states they will not be traveling to [place], [place] for treatment. They have opted for radiation at [facility] and are scheduled for CT Sim 01/26/2021. I verbalized understanding and offered patient/spouse to contact this department if our services are needed in the future. Spouse verbalized understanding.

## 2021-01-19 NOTE — Telephone Encounter (Signed)
Called and spoke with the patient to notify her of the appointment date/time and instructions for the upcoming diagnostic testing as follows:    [facility] PET CT Scan wo/IV contrast on 01/31/21 @ 1:00 pm, patient to arrive by 12:45 pm to register.  Advised patient of the special diet and restrictions she needs to follow the day before the test.  She voiced her understanding and agrees to call our office with any problems or questions.  She does request a copy of instructions to be mailed.  Appt reminder/ instruction letter was mailed.    Bary Castilla, MA  01/19/2021, 13:53

## 2021-01-20 ENCOUNTER — Non-Acute Institutional Stay: Payer: Medicare Hospice | Attending: Nurse Practitioner

## 2021-01-20 ENCOUNTER — Other Ambulatory Visit: Payer: Self-pay

## 2021-01-20 MED ORDER — LINACLOTIDE 72 MCG CAPSULE
72.0000 ug | ORAL_CAPSULE | Freq: Every day | ORAL | Status: AC | PRN
Start: 2021-01-20 — End: ?

## 2021-01-20 MED ORDER — FLUTICASONE PROPIONATE 50 MCG/ACTUATION NASAL SPRAY,SUSPENSION
1.0000 | Freq: Every day | NASAL | Status: AC
Start: 2021-01-20 — End: ?

## 2021-01-20 MED ORDER — OXYCODONE 5 MG TABLET
5.0000 mg | ORAL_TABLET | Freq: Four times a day (QID) | ORAL | Status: AC | PRN
Start: 2021-01-20 — End: ?

## 2021-01-20 MED ORDER — UMECLIDINIUM 62.5 MCG/ACTUATION BLISTER POWDER FOR INHALATION - RN
1.0000 | DISK | Freq: Every day | RESPIRATORY_TRACT | Status: AC
Start: 2021-01-20 — End: ?

## 2021-01-20 MED ORDER — BUDESONIDE-FORMOTEROL HFA 160 MCG-4.5 MCG/ACTUATION AEROSOL INHALER
2.0000 | INHALATION_SPRAY | Freq: Two times a day (BID) | RESPIRATORY_TRACT | Status: AC
Start: 2021-01-20 — End: ?

## 2021-01-20 NOTE — Home Health (Signed)
SOC for this patient today r/t hospitalization for fluid on brain that was removed surgically w/ stent.  Patient lives at home in a singlewide trailer w/ her spouse.  Her daughter lives in another state and they have no friends or family nearby.  Patient's home is fenced in d/t having chickes, dogs.  The yard is muddy and has stepping stones that sink in mud.  The home itself is clean but the floor has some "soft" spots in it.  Patient was neat, clean.  Patient is very concerned that her memory will not return after her illness.  She does not remember much of what happened leading up to illness or while hospitlized.  She is very frustrated by that.  Her spouse seems very supportive and offers information that she does not know.  Patient sees Radiogist-Oncologist, Dr. Dorina Hoyer, on 6/9.  She sees Oncologist, Dr. Jolee Ewing on 6/8 and on 6/14 and also has a PET scan prior to seeing him.  Patient's medications were reconciled w/ medication list and the bottles.  No discrepancies noted.  Patient has one O2 tank in home but [place] is to pick up next Thursday d/t hospital telling her she no longer needed it.  Her O2 saturation was WNL.  Patient was hypotensive but she denied that was unusual.  Nurse encouraged her to drink more water.  Patient also has not been extremely mobile and has been sleeping alot since returning home.  All other VS WNL.  Patient has a surgical wound on right side of her head.  Wound is healing and no drainage noted.  Wound care instructions provided ot patient.  Education provided on fall risk, in-home safety, pandemic preparedness, wound care, medications, s/s of infection, infection control.    Explained to patient why [facility] SN services ordered. Plan of Care and Emergency Plan developed and reviewed with patient and she is in agreement with both plans. All forms/consents explained; Consent for Treatment, Privacy Notice, Patient [place] of Rights, Advanced Directives, and  Financial Responsibility to include any co-pays and/or deductibles she will be responsible for. All forms/consents signed and dated. Explained how to access [facility] agency for any concerns, issues, and problems. Blue HH Notebook given to patient with agency phone number on front cover, circled and shown to patient. Patient is a Full Code.

## 2021-01-20 NOTE — Case Communication (Signed)
Patient has two medications that she is taking that were not on her discharge summary from [facility].  These medications are Judithann Sauger that she uses twice day and Incruse that she uses daily.  Please advise if patient can still safely take these medications w/ her medications that were already prescribed.  Patient's BP was also 95/58.  She had just gotten out of bed and had not drank much or moved around much.

## 2021-01-22 ENCOUNTER — Other Ambulatory Visit: Payer: Self-pay

## 2021-01-23 ENCOUNTER — Other Ambulatory Visit: Payer: Self-pay

## 2021-01-24 ENCOUNTER — Ambulatory Visit (INDEPENDENT_AMBULATORY_CARE_PROVIDER_SITE_OTHER): Payer: Self-pay | Admitting: Anesthesiology

## 2021-01-24 ENCOUNTER — Encounter (INDEPENDENT_AMBULATORY_CARE_PROVIDER_SITE_OTHER): Payer: Medicare Other | Admitting: Anesthesiology

## 2021-01-24 ENCOUNTER — Other Ambulatory Visit: Payer: Medicare Hospice

## 2021-01-24 ENCOUNTER — Other Ambulatory Visit (HOSPITAL_COMMUNITY): Payer: Self-pay

## 2021-01-24 ENCOUNTER — Other Ambulatory Visit: Payer: Self-pay

## 2021-01-24 NOTE — Telephone Encounter (Addendum)
No previous call to this office regarding staple removal at [facility] received. Per chart review pt's husband previously spoke with [facility] office but could not find documentation where staple removal at [facility] was discussed. L/m for husband to return my call to discuss f/u CT at outside facility.  Shelda Pal, RN, CCRN 01/24/2021 09:51    Regarding: [name]  ----- Message from Dorann Ou sent at 01/20/2021  2:25 PM EDT -----  [name]' husband called on 01/18/21 to see if her staples could be removed at [facility] but he hasn't heard anything back yet     Please call Nadara Mustard at [phone]

## 2021-01-25 ENCOUNTER — Other Ambulatory Visit: Payer: Medicare Hospice

## 2021-01-25 ENCOUNTER — Ambulatory Visit: Payer: Medicare Other | Attending: Anesthesiology | Admitting: Internal Medicine

## 2021-01-25 ENCOUNTER — Other Ambulatory Visit: Payer: Self-pay

## 2021-01-25 ENCOUNTER — Other Ambulatory Visit (HOSPITAL_COMMUNITY): Payer: Self-pay | Admitting: Internal Medicine

## 2021-01-25 DIAGNOSIS — C3490 Malignant neoplasm of unspecified part of unspecified bronchus or lung: Secondary | ICD-10-CM | POA: Insufficient documentation

## 2021-01-25 DIAGNOSIS — C189 Malignant neoplasm of colon, unspecified: Secondary | ICD-10-CM | POA: Insufficient documentation

## 2021-01-25 MED ORDER — ACETAMINOPHEN 500 MG TABLET
500.0000 mg | ORAL_TABLET | Freq: Four times a day (QID) | ORAL | Status: AC | PRN
Start: 2021-01-25 — End: ?

## 2021-01-25 NOTE — Home Health (Signed)
Patient was standing at sink upon OT arrival with husband present. Vitals WNL. Med rec completed. Patient denies any changes in insurance or falls. OT discussed financial responsibility, patient is responsible for 0%. Patient verbalized understanding.   OT evaluation on this date to assess ADLs/IADLs, transfers, and home safety.  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 Patient has a PMH of HTN, HLD, Afib, COPD, R lower lung mass, and PE. Patient lives in a single level home with her husband with a home made RTE. It requires a step to get onto it.   Patient states that she was independent in ADLs/IADLs prior. Patient utlized a scooter and/or w/c for long walks. She also has a rollator and a cane that she uses as well PRN.   Patient reports she doesn't have so much difficulty with physically walking, but gets very SOB during ambulation.   Patient reports that she is able to perform grooming, UB/LB dressing, bathing, and toileting with supervision. Patient reports she loves to cook, and she is able to some, but gets very SOB and fatigued. Patient reports a 7/10 on the RPE after morning ADL routine. Patient demonstrates BUE ROM WFL and BUE MMT 4-/5 grossly. Patient "furniture walked" throughout the home. Patient became very SOB with ambulation ~30 feet and had to use her inhaler. Patient has a tub/shower with a shower chair, with a hand-held shower head, no grab bars. Tub transfer NT on this date d/t fatigue. Patient requires CG-S for toilet transfer, LB dressing, and bathing. OT discussed POC with patient and she is agreeable. OT to address ADL/IADL training, transfer training, EC, an HEP

## 2021-01-25 NOTE — Home Health (Addendum)
SN visit for wound evaluation and CP Assessment.  Patient was seated at table upon arrival.  She was still confused as to date, time, situation but was less confused than last visit.  Patient was able to answer all questions today mostly w/o referring to her spouse except about appointments. Patient is to have telephone visit w/ Dr. Halford Chessman this afternoon and a CT scan tomorrow to get set up w/ Radiology.  Medications reconciled w/ bottles - no changes.  Nurse performed hand hygiene, donned gloves and completed CP Assessment.  No issues.  Patient was hypotensive but she says that is normal for her.  Nurse doffed dirty gloves, performed hand hygiene, donned clean gloves, measured and assessed surgical head wound.  No issues r/t wound.  Education provided on medications, pandemic preparedness, s/s of infection, infection control, medications, radiation, fall risk, in-home safety risk.  O2 is to be picked up tomorrow by [facility].  Second visit so financial responsibility discussed again.  Patient and spouse understood responsibility.

## 2021-01-25 NOTE — Progress Notes (Signed)
[place]  [place] [id number]    Telephone Visit    Name:  Kelsey [name] MRN: [id number]   Date:  01/25/2021 Age:   70 y.o.     The patient/family initiated a request for telephone service.  Verbal consent for this service was obtained from the patient/family.    Last office visit in this department: 11/07/2020      Reason for call: h/o lung ca with brain mets       2 ICD-10-CM    1. Malignant neoplasm of colon, unspecified part of colon (CMS HCC)  C18.9    2. Malignant neoplasm of lung, unspecified laterality, unspecified part of lung (CMS HCC)  C34.90        Total provider time spent with the patient on the phone: 15  minutes.    Gala Lewandowsky, MD   Encounter Date: 01/25/2021   4:00 PM EDT    Name:  Kelsey [name]  Age: 70 y.o.  DOB: Nov 05, 1950  Sex: female  PCP: Benjie Karvonen    Chief Complaint:    No chief complaint on file.    HISTORY OF PRESENT ILLNESS:   70 y.o. female with history of stage III colon cancer of present evaluation and management of lung cancer.    1. September 2015. Patient underwent sigmoidoscopy and was found to have polyp which was adenocarcinoma.    2. June 10, 2014. She was admitted for segmental resection of sigmoid colon. This revealed pT3, N1a, MX, low-grade  adenocarcinoma of the colon. The mass measuring 4 x 3 x 0.5 centimeters. It  was low-grade and there was no evidence of microsatellite instability by  histology. Margins were uninvolved and 4 lymph nodes only were removed which  only 1 was involved. This corresponding stage IIIB, T3, N1a, M0, low-grade  sigmoid cancer.     3. July 07, 2014. Recommended adjuvant chemotherapy with FOLFOX.    4. July 27, 2014. Patient was started on adjuvant chemotherapy with FOLFOX.  Patient received only 5 treatments and chemotherapy was stopped due to poor tolerance.  After that patient lost follow-up.      5. October 2019. Patient was admitted to hospital with  concern for  bowel obstruction.  CT chest showed lung nodules.  Patient was managed conservatively and discharged home.    6. May 06, 2018.  Patient underwent CT-guided biopsy of lung lesion.  Pathology showed well-differentiated adenocarcinoma.  Section shows well-differentiated adenocarcinoma which positive for CK 7 and TTF 1, negative for P 63 and CK 20.     7. June 23, 2018. PET scan showed Hypermetabolic right lower lobe pulmonary mass compatible with malignancy.  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 Hepatomegaly and steatosis.     8. July 23, 2018. Patient was started on chemoradiation.    9. October 30, 2018. PET scan showed Hypermetabolic right lower lobe mass with abnormal mediastinal and hilar nodes. There has been only mild improvement since earlier exam.     12/11/2017 to 10/02/2018: received 6480 rads in 36 treatments.  Per Rad Onc notes there was significant noncompliance .  Received chemoradiation with carboplatin/paclitaxel    10. October 31, 2018. Patient was started on maintenance immunotherapy with durvalumab.    11. February 17, 2019. CT chest revealed Moderate right effusion new from 12/19/2018. No change in the right lower lung mass or a presumed postradiation changes in the right upper lung when compared to 12/19/2018.  Mass in the right cardiophrenic angle increased from 10/30/2019 6). Consider an enlarged lymph node here. This is unchanged from 12/19/2018 but increased since size from 10/30/2018. Another possibility is an enlarging pericardial cyst (this region was not hypermetabolic 12/01/2393). Mild enlargement of the left adrenal gland stable from 10/30/2018. Approximate 9 cm upper abdominal ventral hernia containing nonobstructed bowel loops unchanged    12. March 06, 2019. PET CT revealed 1. The RIGHT lower lobe mass has decreased in size and uptake, which is consistent with response to therapy. However, abnormal uptake remains. This is consistent with residual tumor. Suspect  post radiation change in the RIGHT pneumothorax. Consolidation in the medial RIGHT lung has tumor level uptake. Differential: Post radiation change, infection, and tumor infiltration. Additional findings are concerning for tumor infiltration. Follow-up recommended. Increased RIGHT hilar density and uptake. Concerning for tumor spread. Post radiation change less likely. 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 A low-attenuation mass in the RIGHT cardiophrenic angle may be related. Lymph node metastasis less likely.    13. October 29, 2019. CT chest revealed 1. Persistent dense consolidation in the RIGHT middle lobe. Compatible with post radiation change. Residual tumor cannot be excluded. 2. Scattered groundglass opacities in both lungs. Favor post radiation change or infiltrate or tumor. Follow-up recommended. 3. A 5 mm nodule in the LEFT lingula is concerning for metastatic disease. Follow-up is recommended. PET/CT could be useful. Alternatively, tissue biopsy.    14. July 13, 2020. CT Chest reveals 1. Interval increase in size of a small partially loculated right-sided pleural effusion. Pneumothorax component has resolved. 2. Interval increase in size of a left upper lobe pulmonary nodule. Findings concerning for disease progression. 3. Interval resolution of the left lung infiltrate compared to the prior study. 4. Posttreatment changes redemonstrated along the right hilar region.     08/25/20 Established care with Dr Jolee Ewing     10/27/20: Ct chest/abd pelvis  IMPRESSION:  Probable postradiation change right hilum, loculated right pleural effusion and left upper lobe lung nodule worrisome for metastatic disease.  The findings are similar to 07/13/2020  IMPRESSION:  No evidence of metastatic disease in the abdomen and pelvis.    01/09/21; MRI brain   Findings suggestive of intracranial metastatic disease with 3 enhancing lesions within the brain parenchyma.The most prominent of these involves the midbrain  tectum on the left with evidence of hemorrhage.This measures approximately 1.1 cm in diameter.  This results in increased ventricular prominence when compared to the study of 2019, but unchanged when compared to the study performed earlier on this date.  2 additional lesions are located within the frontal lobes (7 mm on the right and 3.5 mm on the left).    01/09/21 MRI spine : No metastatic disease    01/09/21 Ct chest/abd pelvis  1.Spiculated mass-like right hilar/perihilar consolidation likely corresponds to the known history of right lower lobe adenocarcinoma status post radiation treatment. Overall, this is unchanged in appearance from March 2022.  2. Unchanged 1.1 cm lingular, pleural-based pulmonary nodule as compared to March 2022.  3. Moderate loculated right pleural effusion.  4. Nodular thickening of the left adrenal gland is unchanged compared to multiple previous examinations.  5. Otherwise, no new suspicious masses or adenopathy are detected in the chest, abdomen, or pelvis to suggest new sites of metastatic disease.    01/10/21 right frontal evd for obstructive hydrocephalus , susbequently removed     SUBJECTIVE:  Patient presents for follow-up and treatment.  She did not tolerate a PET  scan.  Repeat CT chest [place] 1 showed 11 mm left upper lobe lung nodule concerning for metastatic disease, no evidence of disease in the abdomen.  Patient endorses fatigue and is accompanied with her husband.       REVIEW OF SYSTEMS:  GENERAL:   Reports of fevers and chills.  reports fatigue   HEENT: no sore throat, congestion, blurry vision  Lungs:  Denies any worsening shortness of breath.  Complains of dry cough. Reports dyspnea with exertion  GI: no nausea, vomiting, constipation, no diarrhea  GU: No dysuria, urgency, increased frequency   Cardiac:  Denies any chest pain, palpitations, syncope,   Neuro: reports weakness, loss of function, numbness, tingling  Skin: no rash  Musculoskeletal: reports myalgias and  weakness  Psychosocial: No depression, anxiety  Hematologic: No easy bruising, abnormal bleeding  All other review ROS negative except those mentioned above.     PATIENT HISTORY:  Past Medical History:   Diagnosis Date   . A-fib (CMS HCC)    . Arthropathy, unspecified, site unspecified    . Asthma    . Cancer (CMS HCC)     cervical   . Cataract    . Colon cancer (CMS [place]) 07/06/2014   . COPD (chronic obstructive pulmonary disease) (CMS HCC)    . Fistula    . HTN (hypertension)    . Hyperlipidemia    . Lung mass     with pleural effusion   . Obesity    . Oxygen dependent     2 liters nc    . Sleep apnea    . Ventral hernia        Past Surgical History:   Procedure Laterality Date   . ABSCESS DRAINAGE     . BOWEL RESECTION     . CATARACT EXTRACTION     . HX CERVICAL CONE BIOPSY      . HX CESAREAN SECTION     . HX GASTRIC BYPASS      25 years ago   . [place] (x2)   . HX HYSTERECTOMY      late 1990s   . HX LAP BANDING      2013   . HX LAPAROTOMY      2013 to remove lap band       Family Medical History:     Problem Relation (Age of Onset)    Diabetes Mother, Maternal Grandmother, Maternal Grandfather, Father, Sister    Heart Attack Mother    Stroke Father          Current Outpatient Medications   Medication Sig   . acetaminophen (TYLENOL) 500 mg Oral Tablet Take 500 mg by mouth Every 6 hours as needed for Fever or Pain. Indications: pain associated with arthritis, headache   . albuterol sulfate (PROVENTIL OR VENTOLIN OR PROAIR) 90 mcg/actuation Inhalation HFA Aerosol Inhaler INHALE 1 TO 2 PUFFS BY MOUTH EVERY 6 HOURS AS NEEDED   . albuterol sulfate (PROVENTIL) 2.5 mg /3 mL (0.083 %) Inhalation Solution for Nebulization Take 2.5 mg by nebulization Every 6 hours as needed for Wheezing   . atorvastatin (LIPITOR) 10 mg Oral Tablet Take 10 mg by mouth Once a day   . BREZTRI AEROSPHERE 160-9-4.8 mcg/actuation Inhalation HFA Aerosol Inhaler INHALE 2 PUFFS BY MOUTH TWICE DAILY   . budesonide-formoteroL  (SYMBICORT) 160-4.5 mcg/actuation Inhalation HFA Aerosol Inhaler Take 2 Puffs by inhalation Every 12 hours. Indications: asthma attack, bronchospasm prevention  with COPD, controller medication for asthma   . carvediloL (COREG) 3.125 mg Oral Tablet Take 3.125 mg by mouth Twice daily with food   . docusate sodium (COLACE) 100 mg Oral Capsule Take 100 mg by mouth Three times a day as needed for Constipation   . ferrous sulfate (FEOSOL) 325 mg (65 mg iron) Oral Tablet Take 1 Tablet (325 mg total) by mouth Once a day   . flecainide (TAMBOCOR) 100 mg Oral Tablet Take 1 Tab (100 mg total) by mouth Twice daily   . fluticasone propionate (FLONASE) 50 mcg/actuation Nasal Spray, Suspension Administer 1 Spray into each nostril Once a day. Indications: inflammation of the nose due to an allergy   . levothyroxine (SYNTHROID) 112 mcg Oral Tablet Take 1 Tablet (112 mcg total) by mouth Every morning   . linaCLOtide (LINZESS) 72 mcg Oral Capsule Take 72 mcg by mouth Once per day as needed for Other (constipation). Indications: chronic idiopathic constipation, irritable bowel syndrome with constipation   . lisinopriL (PRINIVIL) 20 mg Oral Tablet Take 20 mg by mouth Once a day   . loperamide (IMODIUM) 2 mg Oral Capsule Take 2 mg by mouth Every 4 hours as needed (Diarrhea)   . loratadine (CLARITIN) 10 mg Oral Tablet Take 1 Tablet (10 mg total) by mouth Once a day   . magnesium oxide (MAG-OX) 400 mg Oral Tablet Take 400 mg by mouth Once a day   . omeprazole (PRILOSEC) 20 mg Oral Capsule, Delayed Release(E.C.) Take 40 mg by mouth Once a day    . oxyCODONE (ROXICODONE) 5 mg Oral Tablet Take 5 mg by mouth Every 6 hours as needed for Pain. Indications: pain   . oxyCODONE-acetaminophen (PERCOCET) 5-325 mg Oral Tablet Take 1 Tablet by mouth Every 4 hours as needed   . potassium chloride (K-DUR) 20 mEq Oral Tab Sust.Rel. Particle/Crystal Take 20 mEq by mouth Every Monday, Wednesday and Friday Takes 3 days a week   . prochlorperazine (COMPAZINE)  10 mg Oral Tablet Take 1 Tab (10 mg total) by mouth Four times a day as needed for Nausea/Vomiting   . rOPINIRole (REQUIP) 0.5 mg Oral Tablet Take 0.5 mg by mouth Every night   . sennosides-docusate sodium (SENOKOT-S) 8.6-50 mg Oral Tablet Take 1 Tablet by mouth Once a day for 14 days   . umeclidinium (INCRUSE ELLIPTA) 62.5 mcg/actuation Inhalation Disk with Device Take 1 INHALATION by inhalation Once a day. Indications: asthma/copd     Social History     Socioeconomic History   . Marital status: Married   Tobacco Use   . Smoking status: Former Research scientist (life sciences)   . Smokeless tobacco: Never Used   . Tobacco comment: quit 10 yrs ago   Vaping Use   . Vaping Use: Never used   Substance and Sexual Activity   . Alcohol use: Not Currently     Comment: rarely   . Drug use: No   . Sexual activity: Not Currently   Other Topics Concern   . Ability to Walk 1 Flight of Steps without SOB/CP No   . Ability To Do Own ADL's Yes       PHYSICAL EXAMINATION:  General Vitals: There were no vitals taken for this visit.      PHYSICAL EXAM:   Consitutional: Appears fatigue  Eyes: EOMI. No discharge. No Jaundice.   ENT: mucous membranes dry. No posterior pharynx lesions.. Neck supple, No palpable masses   Heme/ Lymph: No Cervical, Inguinal, axillary lymh nodes. No Bruising.  Cardiovascular: S1, S2, No murmurs, rubs, or gallops.  Respiratory: RLL diminished sounds and otherwise coarse lung sounds noted.   Abdomen: Normal Bowel Sounds, nontender nondistended. No hepatosplenomegaly.   Musculoskeletal: No Edema to the extremities.   Skin: Normal turgor. No Rashes,skin lesions   Psychiatry: Normal Affect   Neuro: No focal deficits. Alert and Oriented x 3      ENCOUNTER ORDERS:  No orders of the defined types were placed in this encounter.      LABORATORY/RADIOLOGICAL DATA: All pertinent labs/radiology were reviewed.   CBC  Diff   Lab Results   Component Value Date/Time    WBC 9.6 01/11/2021 12:25 AM    HGB 11.9 01/11/2021 12:25 AM    HCT 38.4 01/11/2021  12:25 AM    PLTCNT 225 01/11/2021 12:25 AM    RBC 4.79 01/11/2021 12:25 AM    MCV 80.2 01/11/2021 12:25 AM    MCHC 31.0 01/11/2021 12:25 AM    MCH 24.8 (L) 01/11/2021 12:25 AM    RDW 19.1 (H) 08/01/2020 08:33 AM    MPV 9.3 01/11/2021 12:25 AM    Lab Results   Component Value Date/Time    PMNS 90 01/09/2021 04:50 PM    LYMPHOCYTES 7 01/09/2021 04:50 PM    EOSINOPHIL 3 08/01/2020 08:33 AM    MONOCYTES 9 01/08/2021 07:32 PM    BASOPHILS 1 01/08/2021 07:32 PM    BASOPHILS <0.10 01/08/2021 07:32 PM    PMNABS 4.72 01/08/2021 07:32 PM    LYMPHSABS 0.87 (L) 01/08/2021 07:32 PM    EOSABS <0.10 01/08/2021 07:32 PM    MONOSABS 0.58 01/08/2021 07:32 PM    BASOSABS 0.10 11/10/2014 02:28 PM    BASABS 0.01 09/17/2018 08:24 AM        COMPREHENSIVE METABOLIC PANEL  Lab Results   Component Value Date    SODIUM 138 01/11/2021    POTASSIUM 4.3 01/11/2021    CHLORIDE 105 01/11/2021    CO2 25 01/11/2021    ANIONGAP 8 01/11/2021    BUN 18 01/11/2021    CREATININE 0.71 01/11/2021    GLUCOSE Negative 01/09/2021    CALCIUM 8.2 (L) 01/11/2021    PHOSPHORUS 2.7 01/11/2021    ALBUMIN 3.7 11/07/2020    TOTALPROTEIN 6.1 11/07/2020    ALKPHOS 65 01/08/2021    AST 27 11/07/2020    ALT 19 11/07/2020    BILIRUBINCON 0.2 03/22/2018     THYROID STIMULATING HORMONE  Lab Results   Component Value Date    TSH 9.750 (H) 10/20/2020         ASSESSMENT AND PLAN:  70 y.o. female with history of stage III colon cancer status post 5 cycle of FOLFOX later diagnosed with  stage IIIB lung Aadenocarcinoma.   Patient was on consolidation durvalumab, treatment was delayed due to several missed appointment.  She was supposed to start radiation for contralateral left upper lobe lung nodule.  However recently admitted to [facility] with new brain Mets, which were unfortunately not amenable for biopsy.    2 ICD-10-CM    1. Malignant neoplasm of colon, unspecified part of colon (CMS HCC)  C18.9    2. Malignant neoplasm of lung, unspecified laterality, unspecified part of lung  (CMS HCC)  C34.90        1. 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 She required ventriculostomy due to obstructive hydrocephalus .  Brain tumor not amenable for surgical biopsy per neurosurgery  Patient will be seen by rad onc tomorrow for  brain radiation   PET scan ordered for metastatic workup  Will likely need to switch treatment to chemo/immunotherapy      Loculated right pleural effusion  Status post thoracentesis, cytology negative for malignant    H/O colon ca  CEA 1.7       Return in about 8 days (around 02/02/2021) for Exam, pet.      Gala Lewandowsky, MD  Hematology/Oncology

## 2021-01-26 ENCOUNTER — Ambulatory Visit: Payer: Self-pay

## 2021-01-26 ENCOUNTER — Ambulatory Visit (INDEPENDENT_AMBULATORY_CARE_PROVIDER_SITE_OTHER): Payer: Self-pay | Admitting: Anesthesiology

## 2021-01-26 ENCOUNTER — Other Ambulatory Visit: Payer: Self-pay

## 2021-01-26 ENCOUNTER — Other Ambulatory Visit (HOSPITAL_COMMUNITY): Payer: Medicare Other

## 2021-01-26 MED ORDER — OXYGEN
2.0000 L/min | GAS_FOR_INHALATION | RESPIRATORY_TRACT | Status: AC
Start: 2021-01-20 — End: ?

## 2021-01-26 NOTE — Telephone Encounter (Addendum)
L/m informing pt/pt husband staples can come out at [facility] and I need to confirm the pt needs CT at [facility] so the order and auth can be changed to reflect the correct facility to prevent further delay in scheduling.  Shelda Pal, RN, CCRN 01/26/2021 17:03    Regarding: Kelsey [name]   ----- Message from Elease Hashimoto sent at 01/24/2021  1:54 PM EDT -----  Kelsey [name] - Patient is returning your call please advise     Thanks

## 2021-01-27 ENCOUNTER — Other Ambulatory Visit: Payer: Self-pay

## 2021-01-27 NOTE — Home Health (Signed)
Patient presents for [facility] PT initial evaluation visit following hospitalization for hydrocephalus with surgical intervention on 5/24 prior to being d/c'd to home on 5/26 with HH. HH SN present with patient upon arrival, vital signs obtained from nurse, medication reconciliation performed also by nurse. Patient denies changes to insurance, medications, or falls. Patient denies having had any falls within the past 6 months. Patient reports that she will begin radiation treatments tomorrow and has 8 daily treatments planned. Patient lives in single-wide trailer with spouse where she has ramp access with long uneven walkway to vehicle. Patient reports she was previously ambulatory within home w/o AD and would use 4WW when leaving home. She notes feeling weakness in bilateral LE's and fatigue with SOB affecting current mobility. 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 Sit-stand transfers from chair and toilet demonstrated with CGA/minA and BUE support and pulling herself up using UEs with increased difficulty from toilet. Bed mobility and transfers demonstrated with CGA needing BUE support. Ambulation demonstrated x 25 ft. w/o AD holding to furniture/walls through home, demonstrates decreased stance time, step-length, and poor foot clearance noted throughout. BLE MMT: R = 3-/5, L = 3-/5. Tientti = 13/28. Patient educated for supine HEP: ankle pumps, quad sets, glute sets, hip abd/add, SLR, bridging. Patient left with printed copy of HEP. Patient presents today with weakness and balance deficits placing her at increased risk for falls and increased dependence on spouse for assistance. Patient is good candidate for [facility] PT. Plan for visits 1w2, 2w3 to include: therapeutic exercise, transfer training, balance training, gait training, and HEP education/progression.

## 2021-01-29 ENCOUNTER — Other Ambulatory Visit: Payer: Self-pay

## 2021-01-30 ENCOUNTER — Inpatient Hospital Stay
Admission: RE | Admit: 2021-01-30 | Discharge: 2021-01-30 | Disposition: A | Discharge status: Home / Self Care | Payer: Medicare Other | Source: Ambulatory Visit | Attending: Radiation Oncology | Admitting: Radiation Oncology

## 2021-01-30 ENCOUNTER — Other Ambulatory Visit: Payer: Self-pay

## 2021-01-30 ENCOUNTER — Telehealth (HOSPITAL_COMMUNITY): Payer: Self-pay

## 2021-01-30 ENCOUNTER — Other Ambulatory Visit: Payer: Medicare Hospice

## 2021-01-30 ENCOUNTER — Other Ambulatory Visit (HOSPITAL_COMMUNITY): Payer: Self-pay | Admitting: Radiation Oncology

## 2021-01-30 DIAGNOSIS — C7931 Secondary malignant neoplasm of brain: Secondary | ICD-10-CM

## 2021-01-30 DIAGNOSIS — C7949 Secondary malignant neoplasm of other parts of nervous system: Secondary | ICD-10-CM

## 2021-01-30 MED ORDER — OXYGEN
2.0000 L/min | GAS_FOR_INHALATION | Freq: Every day | RESPIRATORY_TRACT | Status: AC | PRN
Start: 2021-01-20 — End: ?

## 2021-01-30 NOTE — Telephone Encounter (Signed)
Attempted to call patient regarding instructions for PET scan scheduled for 01/31/21. LM x3 with callback number.

## 2021-01-30 NOTE — Progress Notes (Signed)
S. Patient pre-medicated w Valium 10 mg approx. 1 hour ago. Was brought to sim and immobilized in Aquaplast mask. Could not tolerate simulation procedure. Discussed w husband about situation and patient just desired to go home.  Discussed possible ramifications of no treatment including seizures, bleeding and possible death and patient still refused to get treatment.  Will follow prn.

## 2021-01-31 ENCOUNTER — Ambulatory Visit
Admission: RE | Admit: 2021-01-31 | Discharge: 2021-01-31 | Disposition: A | Discharge status: Home / Self Care | Payer: Medicare Other | Source: Ambulatory Visit | Attending: Internal Medicine | Admitting: Internal Medicine

## 2021-01-31 ENCOUNTER — Other Ambulatory Visit: Payer: Medicare Hospice

## 2021-01-31 ENCOUNTER — Other Ambulatory Visit: Payer: Self-pay

## 2021-01-31 DIAGNOSIS — C7802 Secondary malignant neoplasm of left lung: Secondary | ICD-10-CM | POA: Insufficient documentation

## 2021-01-31 DIAGNOSIS — C3491 Malignant neoplasm of unspecified part of right bronchus or lung: Secondary | ICD-10-CM | POA: Insufficient documentation

## 2021-01-31 LAB — POC BLOOD GLUCOSE (RESULTS): GLUCOSE, POC: 76 mg/dl (ref 70–110)

## 2021-01-31 NOTE — Home Health (Signed)
Patient requested no further visits from [facility] or PT.

## 2021-01-31 NOTE — Case Communication (Signed)
Patient requested no additional visits from [facility] or PT.

## 2021-02-01 ENCOUNTER — Other Ambulatory Visit: Payer: Self-pay

## 2021-02-02 ENCOUNTER — Other Ambulatory Visit: Payer: Self-pay

## 2021-02-02 ENCOUNTER — Encounter (HOSPITAL_COMMUNITY): Payer: Self-pay | Admitting: Internal Medicine

## 2021-02-03 ENCOUNTER — Other Ambulatory Visit: Payer: Self-pay

## 2021-02-06 ENCOUNTER — Other Ambulatory Visit: Payer: Self-pay

## 2021-02-07 ENCOUNTER — Other Ambulatory Visit: Payer: Self-pay

## 2021-02-08 ENCOUNTER — Other Ambulatory Visit: Payer: Self-pay

## 2021-02-09 ENCOUNTER — Other Ambulatory Visit: Payer: Self-pay

## 2021-02-09 ENCOUNTER — Other Ambulatory Visit (HOSPITAL_COMMUNITY): Payer: Self-pay | Admitting: Internal Medicine

## 2021-02-10 ENCOUNTER — Other Ambulatory Visit: Payer: Self-pay

## 2021-02-10 ENCOUNTER — Other Ambulatory Visit (HOSPITAL_COMMUNITY): Payer: Self-pay | Admitting: Pharmacist

## 2021-02-12 ENCOUNTER — Other Ambulatory Visit: Payer: Self-pay

## 2021-02-13 ENCOUNTER — Ambulatory Visit (HOSPITAL_COMMUNITY): Payer: Medicare Other | Admitting: Internal Medicine

## 2021-02-13 ENCOUNTER — Other Ambulatory Visit: Payer: Self-pay

## 2021-02-14 ENCOUNTER — Other Ambulatory Visit: Payer: Self-pay

## 2021-02-15 ENCOUNTER — Other Ambulatory Visit: Payer: Self-pay

## 2021-02-16 ENCOUNTER — Other Ambulatory Visit: Payer: Self-pay

## 2021-02-16 ENCOUNTER — Ambulatory Visit: Payer: Medicare Other | Attending: Internal Medicine | Admitting: Internal Medicine

## 2021-02-16 ENCOUNTER — Encounter (HOSPITAL_COMMUNITY): Payer: Self-pay | Admitting: Internal Medicine

## 2021-02-16 DIAGNOSIS — Z87891 Personal history of nicotine dependence: Secondary | ICD-10-CM | POA: Insufficient documentation

## 2021-02-16 DIAGNOSIS — Z923 Personal history of irradiation: Secondary | ICD-10-CM | POA: Insufficient documentation

## 2021-02-16 DIAGNOSIS — Z79899 Other long term (current) drug therapy: Secondary | ICD-10-CM | POA: Insufficient documentation

## 2021-02-16 DIAGNOSIS — C7801 Secondary malignant neoplasm of right lung: Secondary | ICD-10-CM | POA: Insufficient documentation

## 2021-02-16 DIAGNOSIS — Z9221 Personal history of antineoplastic chemotherapy: Secondary | ICD-10-CM | POA: Insufficient documentation

## 2021-02-16 DIAGNOSIS — C7931 Secondary malignant neoplasm of brain: Secondary | ICD-10-CM | POA: Insufficient documentation

## 2021-02-16 NOTE — Progress Notes (Signed)
[place]  [place] [address]    Telephone Visit    Name:  Kelsey [name] MRN: [id number]   Date:  02/16/2021 Age:   70 y.o.     The patient/family initiated a request for telephone service.  Verbal consent for this service was obtained from the patient/family.    Last office visit in this department: 11/07/2020      Reason for call: h/o lung ca with brain mets        ICD-10-CM    1. 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    Gala Lewandowsky, MD   Encounter Date: 02/16/2021   2:45 PM EDT    Name:  Kelsey [name]  Age: 70 y.o.  DOB: 1950-11-06  Sex: female  PCP: Benjie Karvonen    Chief Complaint:    No chief complaint on file.    HISTORY OF PRESENT ILLNESS:   70 y.o. female with history of stage III colon cancer of present evaluation and management of lung cancer.    1. September 2015. Patient underwent sigmoidoscopy and was found to have polyp which was adenocarcinoma.    2. June 10, 2014. She was admitted for segmental resection of sigmoid colon. This revealed pT3, N1a, MX, low-grade  adenocarcinoma of the colon. The mass measuring 4 x 3 x 0.5 centimeters. It  was low-grade and there was no evidence of microsatellite instability by  histology. Margins were uninvolved and 4 lymph nodes only were removed which  only 1 was involved. This corresponding stage IIIB, T3, N1a, M0, low-grade  sigmoid cancer.     3. July 07, 2014. Recommended adjuvant chemotherapy with FOLFOX.    4. July 27, 2014. Patient was started on adjuvant chemotherapy with FOLFOX.  Patient received only 5 treatments and chemotherapy was stopped due to poor tolerance.  After that patient lost follow-up.      5. October 2019. Patient was admitted to hospital with concern for  bowel obstruction.  CT chest showed lung nodules.  Patient was managed conservatively and discharged  home.    6. May 06, 2018.  Patient underwent CT-guided biopsy of lung lesion.  Pathology showed well-differentiated adenocarcinoma.  Section shows well-differentiated adenocarcinoma which positive for CK 7 and TTF 1, negative for P 63 and CK 20.     7. June 23, 2018. PET scan showed Hypermetabolic right lower lobe pulmonary mass compatible with malignancy.  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 Hepatomegaly and steatosis.     8. July 23, 2018. Patient was started on chemoradiation.    9. October 30, 2018. PET scan showed Hypermetabolic right lower lobe mass with abnormal mediastinal and hilar nodes. There has been only mild improvement since earlier exam.     12/11/2017 to 10/02/2018: received 6480 rads in 36 treatments.  Per Rad Onc notes there was significant noncompliance .  Received chemoradiation with carboplatin/paclitaxel    10. October 31, 2018. Patient was started on maintenance immunotherapy with durvalumab.    11. February 17, 2019. CT chest revealed Moderate right effusion new from 12/19/2018. No change in the right lower lung mass or a presumed postradiation changes in the right upper lung when compared to 12/19/2018. Mass in the right cardiophrenic angle increased from 10/30/2019 6). Consider an enlarged lymph node here. This is unchanged from 12/19/2018  but increased since size from 10/30/2018. Another possibility is an enlarging pericardial cyst (this region was not hypermetabolic 9/41/7408). Mild enlargement of the left adrenal gland stable from 10/30/2018. Approximate 9 cm upper abdominal ventral hernia containing nonobstructed bowel loops unchanged    12. March 06, 2019. PET CT revealed 1. The RIGHT lower lobe mass has decreased in size and uptake, which is consistent with response to therapy. However, abnormal uptake remains. This is consistent with residual tumor. Suspect post radiation change in the RIGHT pneumothorax. Consolidation in the medial RIGHT lung has tumor level uptake.  Differential: Post radiation change, infection, and tumor infiltration. Additional findings are concerning for tumor infiltration. Follow-up recommended. Increased RIGHT hilar density and uptake. Concerning for tumor spread. Post radiation change less likely. 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 A low-attenuation mass in the RIGHT cardiophrenic angle may be related. Lymph node metastasis less likely.    13. October 29, 2019. CT chest revealed 1. Persistent dense consolidation in the RIGHT middle lobe. Compatible with post radiation change. Residual tumor cannot be excluded. 2. Scattered groundglass opacities in both lungs. Favor post radiation change or infiltrate or tumor. Follow-up recommended. 3. A 5 mm nodule in the LEFT lingula is concerning for metastatic disease. Follow-up is recommended. PET/CT could be useful. Alternatively, tissue biopsy.    14. July 13, 2020. CT Chest reveals 1. Interval increase in size of a small partially loculated right-sided pleural effusion. Pneumothorax component has resolved. 2. Interval increase in size of a left upper lobe pulmonary nodule. Findings concerning for disease progression. 3. Interval resolution of the left lung infiltrate compared to the prior study. 4. Posttreatment changes redemonstrated along the right hilar region.     08/25/20 Established care with Dr Jolee Ewing     10/27/20: Ct chest/abd pelvis  IMPRESSION:  Probable postradiation change right hilum, loculated right pleural effusion and left upper lobe lung nodule worrisome for metastatic disease.  The findings are similar to 07/13/2020  IMPRESSION:  No evidence of metastatic disease in the abdomen and pelvis.    01/09/21; MRI brain   Findings suggestive of intracranial metastatic disease with 3 enhancing lesions within the brain parenchyma.The most prominent of these involves the midbrain tectum on the left with evidence of hemorrhage.This measures approximately 1.1 cm in diameter.  This results in  increased ventricular prominence when compared to the study of 2019, but unchanged when compared to the study performed earlier on this date.  2 additional lesions are located within the frontal lobes (7 mm on the right and 3.5 mm on the left).    01/09/21 MRI spine : No metastatic disease    01/09/21 Ct chest/abd pelvis  1.Spiculated mass-like right hilar/perihilar consolidation likely corresponds to the known history of right lower lobe adenocarcinoma status post radiation treatment. Overall, this is unchanged in appearance from March 2022.  2. Unchanged 1.1 cm lingular, pleural-based pulmonary nodule as compared to March 2022.  3. Moderate loculated right pleural effusion.  4. Nodular thickening of the left adrenal gland is unchanged compared to multiple previous examinations.  5. Otherwise, no new suspicious masses or adenopathy are detected in the chest, abdomen, or pelvis to suggest new sites of metastatic disease.    01/10/21 right frontal evd for obstructive hydrocephalus , susbequently removed     01/31/21: PET SCAN  IMPRESSION:  Disease progression. Multiple new bilateral pulmonary metastases.    SUBJECTIVE:  Discussed case with patient's husband since patient currently admitted to [facility] with Afib.  Of note  patient has refused brain radiation due to claustrophobia     PATIENT HISTORY:  Past Medical History:   Diagnosis Date   . A-fib (CMS HCC)    . Arthropathy, unspecified, site unspecified    . Asthma    . Cancer (CMS HCC)     cervical   . Cataract    . Colon cancer (CMS [place]) 07/06/2014   . COPD (chronic obstructive pulmonary disease) (CMS HCC)    . Fistula    . HTN (hypertension)    . Hyperlipidemia    . Lung mass     with pleural effusion   . Obesity    . Oxygen dependent     2 liters nc    . Sleep apnea    . Ventral hernia        Past Surgical History:   Procedure Laterality Date   . ABSCESS DRAINAGE     . BOWEL RESECTION     . CATARACT EXTRACTION     . HX CERVICAL CONE BIOPSY      . HX  CESAREAN SECTION     . HX GASTRIC BYPASS      25 years ago   . [place] (x2)   . HX HYSTERECTOMY      late 1990s   . HX LAP BANDING      2013   . HX LAPAROTOMY      2013 to remove lap band       Family Medical History:     Problem Relation (Age of Onset)    Diabetes Mother, Maternal Grandmother, Maternal Grandfather, Father, Sister    Heart Attack Mother    Stroke Father          Current Outpatient Medications   Medication Sig   . acetaminophen (TYLENOL) 500 mg Oral Tablet Take 500 mg by mouth Every 6 hours as needed for Fever or Pain. Indications: pain associated with arthritis, headache   . albuterol sulfate (PROVENTIL OR VENTOLIN OR PROAIR) 90 mcg/actuation Inhalation HFA Aerosol Inhaler INHALE 1 TO 2 PUFFS BY MOUTH EVERY 6 HOURS AS NEEDED (Patient taking differently: INHALE 1 TO 2 PUFFS BY MOUTH EVERY 6 HOURS AS NEEDED, shortness of breath or wheezing)   . albuterol sulfate (PROVENTIL) 2.5 mg /3 mL (0.083 %) Inhalation Solution for Nebulization Take 2.5 mg by nebulization Every 6 hours as needed for Wheezing   . atorvastatin (LIPITOR) 10 mg Oral Tablet Take 10 mg by mouth Once a day   . BREZTRI AEROSPHERE 160-9-4.8 mcg/actuation Inhalation HFA Aerosol Inhaler INHALE 2 PUFFS BY MOUTH TWICE DAILY   . budesonide-formoteroL (SYMBICORT) 160-4.5 mcg/actuation Inhalation HFA Aerosol Inhaler Take 2 Puffs by inhalation Every 12 hours. Indications: asthma attack, bronchospasm prevention with COPD, controller medication for asthma   . carvediloL (COREG) 3.125 mg Oral Tablet Take 3.125 mg by mouth Twice daily with food   . docusate sodium (COLACE) 100 mg Oral Capsule Take 100 mg by mouth Three times a day as needed for Constipation   . ferrous sulfate (FEOSOL) 325 mg (65 mg iron) Oral Tablet Take 1 Tablet (325 mg total) by mouth Once a day   . flecainide (TAMBOCOR) 100 mg Oral Tablet Take 1 Tab (100 mg total) by mouth Twice daily   . fluticasone propionate (FLONASE) 50 mcg/actuation Nasal Spray,  Suspension Administer 1 Spray into each nostril Once a day. Indications: inflammation of the nose due to an allergy   . levothyroxine (  SYNTHROID) 112 mcg Oral Tablet Take 1 Tablet (112 mcg total) by mouth Every morning   . linaCLOtide (LINZESS) 72 mcg Oral Capsule Take 72 mcg by mouth Once per day as needed for Other (constipation). Indications: chronic idiopathic constipation, irritable bowel syndrome with constipation   . lisinopriL (PRINIVIL) 20 mg Oral Tablet Take 20 mg by mouth Once a day   . loperamide (IMODIUM) 2 mg Oral Capsule Take 2 mg by mouth Every 4 hours as needed (Diarrhea)   . loratadine (CLARITIN) 10 mg Oral Tablet Take 1 Tablet (10 mg total) by mouth Once a day   . magnesium oxide (MAG-OX) 400 mg Oral Tablet Take 400 mg by mouth Once a day   . omeprazole (PRILOSEC) 20 mg Oral Capsule, Delayed Release(E.C.) Take 40 mg by mouth Once a day    . oxyCODONE (ROXICODONE) 5 mg Oral Tablet Take 5 mg by mouth Every 6 hours as needed for Pain. Indications: pain   . oxyCODONE-acetaminophen (PERCOCET) 5-325 mg Oral Tablet Take 1 Tablet by mouth Every 4 hours as needed   . oxygen (O2) Inhalation gas Take 2 L/min by inhalation continuous. Patient is no longer using O2 but does have it in the home.    Indications: dyspnea   . oxygen (O2) Inhalation gas Take 2 L/min by inhalation Once per day as needed (dyspnea). Indications: dyspnea   . potassium chloride (K-DUR) 20 mEq Oral Tab Sust.Rel. Particle/Crystal Take 20 mEq by mouth Every Monday, Wednesday and Friday Takes 3 days a week   . prochlorperazine (COMPAZINE) 10 mg Oral Tablet Take 1 Tab (10 mg total) by mouth Four times a day as needed for Nausea/Vomiting   . rOPINIRole (REQUIP) 0.5 mg Oral Tablet Take 0.5 mg by mouth Every night   . umeclidinium (INCRUSE ELLIPTA) 62.5 mcg/actuation Inhalation Disk with Device Take 1 INHALATION by inhalation Once a day. Indications: asthma/copd     Social History     Socioeconomic History   . Marital status: Married   Tobacco  Use   . Smoking status: Former Research scientist (life sciences)   . Smokeless tobacco: Never Used   . Tobacco comment: quit 10 yrs ago   Vaping Use   . Vaping Use: Never used   Substance and Sexual Activity   . Alcohol use: Not Currently     Comment: rarely   . Drug use: No   . Sexual activity: Not Currently   Other Topics Concern   . Ability to Walk 1 Flight of Steps without SOB/CP No   . Ability To Do Own ADL's Yes       PHYSICAL EXAMINATION:  General Vitals: There were no vitals taken for this visit.      PHYSICAL EXAM:   No exam       ENCOUNTER ORDERS:  No orders of the defined types were placed in this encounter.      LABORATORY/RADIOLOGICAL DATA: All pertinent labs/radiology were reviewed.   CBC  Diff   Lab Results   Component Value Date/Time    WBC 9.6 01/11/2021 12:25 AM    HGB 11.9 01/11/2021 12:25 AM    HCT 38.4 01/11/2021 12:25 AM    PLTCNT 225 01/11/2021 12:25 AM    RBC 4.79 01/11/2021 12:25 AM    MCV 80.2 01/11/2021 12:25 AM    MCHC 31.0 01/11/2021 12:25 AM    MCH 24.8 (L) 01/11/2021 12:25 AM    RDW 19.1 (H) 08/01/2020 08:33 AM    MPV 9.3 01/11/2021 12:25 AM  Lab Results   Component Value Date/Time    PMNS 90 01/09/2021 04:50 PM    LYMPHOCYTES 7 01/09/2021 04:50 PM    EOSINOPHIL 3 08/01/2020 08:33 AM    MONOCYTES 9 01/08/2021 07:32 PM    BASOPHILS 1 01/08/2021 07:32 PM    BASOPHILS <0.10 01/08/2021 07:32 PM    PMNABS 4.72 01/08/2021 07:32 PM    LYMPHSABS 0.87 (L) 01/08/2021 07:32 PM    EOSABS <0.10 01/08/2021 07:32 PM    MONOSABS 0.58 01/08/2021 07:32 PM    BASOSABS 0.10 11/10/2014 02:28 PM    BASABS 0.01 09/17/2018 08:24 AM        COMPREHENSIVE METABOLIC PANEL  Lab Results   Component Value Date    SODIUM 138 01/11/2021    POTASSIUM 4.3 01/11/2021    CHLORIDE 105 01/11/2021    CO2 25 01/11/2021    ANIONGAP 8 01/11/2021    BUN 18 01/11/2021    CREATININE 0.71 01/11/2021    GLUCOSE Negative 01/09/2021    CALCIUM 8.2 (L) 01/11/2021    PHOSPHORUS 2.7 01/11/2021    ALBUMIN 3.7 11/07/2020    TOTALPROTEIN 6.1 11/07/2020    ALKPHOS  65 01/08/2021    AST 27 11/07/2020    ALT 19 11/07/2020    BILIRUBINCON 0.2 03/22/2018     THYROID STIMULATING HORMONE  Lab Results   Component Value Date    TSH 9.750 (H) 10/20/2020         ASSESSMENT AND PLAN:  70 y.o. female with history of stage III colon cancer status post 5 cycle of FOLFOX later diagnosed with  stage IIIB lung Aadenocarcinoma.   Patient was on consolidation durvalumab, treatment was delayed due to several missed appointment.  She was supposed to start radiation for contralateral left upper lobe lung nodule.  However recently admitted to [facility] with new brain Mets, which were unfortunately not amenable for biopsy.     ICD-10-CM    1. 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 Adenocarcinoma right  lung:  Stage IV  70 year old female who was diagnosed with stage IIIB adenocarcinoma of the right lung in September 2019, status post chemoradiation currently on maintenance durvalumab  She had a recent CT scan from November 2021 which showed increased size of loculated light pleural effusion, increase in the size of the left upper lobe pulmonary nodule measuring up to 1.1 cm, along with anterior wall abdominal hernia  I had previously Discussed CT findings with patient, left lung nodule concerning for malignancy  Discussed CT chest with Dr. 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 She required ventriculostomy due to obstructive hydrocephalus .  Brain tumor not amenable for surgical biopsy per neurosurgery  Patient has refused brain radiation, understands risk of disease progression, death  PET scan showed b/l lung nodules concerning for metastatic disease  Will  switch  treatment to chemo/immunotherapy  CARIS was negative for egfr/alk/ros/BRAF  Will discuss with caris if enough tissue for RET/NTRK         Return in about 1 week (around 02/23/2021) for CBC, CMP, Exam/Tx.      Gala Lewandowsky, MD  Hematology/Oncology

## 2021-02-21 ENCOUNTER — Telehealth (HOSPITAL_COMMUNITY): Payer: Self-pay | Admitting: Internal Medicine

## 2021-02-21 ENCOUNTER — Other Ambulatory Visit: Payer: Self-pay

## 2021-02-21 NOTE — Telephone Encounter (Signed)
Called to confirm Tx appt sched for 7/6 --    Patient's husband states that Carolin is now under care of Hospice and will not be returning for any further follow ups nor treatment. Cancelled tx appt per caregiver request. - AG 7.5.22.

## 2021-02-22 ENCOUNTER — Other Ambulatory Visit: Payer: Self-pay

## 2021-02-22 ENCOUNTER — Inpatient Hospital Stay (HOSPITAL_COMMUNITY): Payer: Medicare Other

## 2021-02-22 ENCOUNTER — Encounter (HOSPITAL_COMMUNITY): Payer: Self-pay | Admitting: Internal Medicine

## 2021-02-23 ENCOUNTER — Ambulatory Visit: Payer: Self-pay

## 2021-03-01 ENCOUNTER — Other Ambulatory Visit: Payer: Self-pay

## 2021-03-08 ENCOUNTER — Other Ambulatory Visit: Payer: Self-pay

## 2021-03-20 DEATH — deceased

## 2021-04-13 ENCOUNTER — Ambulatory Visit (INDEPENDENT_AMBULATORY_CARE_PROVIDER_SITE_OTHER): Payer: Self-pay | Admitting: Neurology
